# Patient Record
Sex: Female | Born: 1960 | Race: Black or African American | Hispanic: No | Smoking: Current every day smoker
Health system: Southern US, Community
[De-identification: ages and names within clinical notes are randomized; demographics above are authoritative.]

## PROBLEM LIST (undated history)

## (undated) DIAGNOSIS — I455 Other specified heart block: Secondary | ICD-10-CM

## (undated) DIAGNOSIS — M545 Low back pain, unspecified: Secondary | ICD-10-CM

## (undated) DIAGNOSIS — R202 Paresthesia of skin: Secondary | ICD-10-CM

## (undated) DIAGNOSIS — I341 Nonrheumatic mitral (valve) prolapse: Secondary | ICD-10-CM

## (undated) DIAGNOSIS — I10 Essential (primary) hypertension: Secondary | ICD-10-CM

## (undated) DIAGNOSIS — A64 Unspecified sexually transmitted disease: Secondary | ICD-10-CM

## (undated) DIAGNOSIS — M1990 Unspecified osteoarthritis, unspecified site: Secondary | ICD-10-CM

## (undated) DIAGNOSIS — K760 Fatty (change of) liver, not elsewhere classified: Secondary | ICD-10-CM

## (undated) DIAGNOSIS — J449 Chronic obstructive pulmonary disease, unspecified: Secondary | ICD-10-CM

## (undated) DIAGNOSIS — I5032 Chronic diastolic (congestive) heart failure: Secondary | ICD-10-CM

## (undated) DIAGNOSIS — T4145XA Adverse effect of unspecified anesthetic, initial encounter: Secondary | ICD-10-CM

## (undated) DIAGNOSIS — F32A Depression, unspecified: Secondary | ICD-10-CM

## (undated) DIAGNOSIS — F1910 Other psychoactive substance abuse, uncomplicated: Secondary | ICD-10-CM

## (undated) DIAGNOSIS — G43909 Migraine, unspecified, not intractable, without status migrainosus: Secondary | ICD-10-CM

## (undated) DIAGNOSIS — F419 Anxiety disorder, unspecified: Secondary | ICD-10-CM

## (undated) DIAGNOSIS — E119 Type 2 diabetes mellitus without complications: Secondary | ICD-10-CM

## (undated) DIAGNOSIS — J42 Unspecified chronic bronchitis: Secondary | ICD-10-CM

## (undated) DIAGNOSIS — H44001 Unspecified purulent endophthalmitis, right eye: Secondary | ICD-10-CM

## (undated) DIAGNOSIS — G709 Myoneural disorder, unspecified: Secondary | ICD-10-CM

## (undated) DIAGNOSIS — R011 Cardiac murmur, unspecified: Secondary | ICD-10-CM

## (undated) DIAGNOSIS — G8929 Other chronic pain: Secondary | ICD-10-CM

## (undated) DIAGNOSIS — I639 Cerebral infarction, unspecified: Secondary | ICD-10-CM

## (undated) DIAGNOSIS — Z8489 Family history of other specified conditions: Secondary | ICD-10-CM

## (undated) DIAGNOSIS — D219 Benign neoplasm of connective and other soft tissue, unspecified: Secondary | ICD-10-CM

## (undated) DIAGNOSIS — M7989 Other specified soft tissue disorders: Secondary | ICD-10-CM

## (undated) DIAGNOSIS — R0602 Shortness of breath: Secondary | ICD-10-CM

## (undated) DIAGNOSIS — G4730 Sleep apnea, unspecified: Secondary | ICD-10-CM

## (undated) DIAGNOSIS — I509 Heart failure, unspecified: Secondary | ICD-10-CM

## (undated) DIAGNOSIS — T8859XA Other complications of anesthesia, initial encounter: Secondary | ICD-10-CM

## (undated) HISTORY — PX: CARPAL TUNNEL RELEASE: SHX101

## (undated) HISTORY — DX: Anxiety disorder, unspecified: F41.9

## (undated) HISTORY — DX: Essential (primary) hypertension: I10

## (undated) HISTORY — PX: DILATION AND CURETTAGE OF UTERUS: SHX78

## (undated) HISTORY — PX: FOOT SURGERY: SHX648

## (undated) HISTORY — DX: Depression, unspecified: F32.A

## (undated) HISTORY — DX: Unspecified sexually transmitted disease: A64

## (undated) HISTORY — DX: Other psychoactive substance abuse, uncomplicated: F19.10

## (undated) HISTORY — DX: Paresthesia of skin: R20.2

## (undated) HISTORY — DX: Benign neoplasm of connective and other soft tissue, unspecified: D21.9

## (undated) HISTORY — PX: MYOMECTOMY: SHX85

---

## 1999-02-20 ENCOUNTER — Other Ambulatory Visit: Admission: RE | Admit: 1999-02-20 | Discharge: 1999-02-20 | Payer: Self-pay | Admitting: Obstetrics and Gynecology

## 1999-09-06 ENCOUNTER — Other Ambulatory Visit: Admission: RE | Admit: 1999-09-06 | Discharge: 1999-09-06 | Payer: Self-pay | Admitting: *Deleted

## 2000-11-19 ENCOUNTER — Other Ambulatory Visit: Admission: RE | Admit: 2000-11-19 | Discharge: 2000-11-19 | Payer: Self-pay | Admitting: *Deleted

## 2002-03-13 ENCOUNTER — Emergency Department (HOSPITAL_COMMUNITY): Admission: EM | Admit: 2002-03-13 | Discharge: 2002-03-13 | Payer: Self-pay | Admitting: Emergency Medicine

## 2002-03-15 ENCOUNTER — Emergency Department (HOSPITAL_COMMUNITY): Admission: EM | Admit: 2002-03-15 | Discharge: 2002-03-16 | Payer: Self-pay | Admitting: Emergency Medicine

## 2002-04-28 ENCOUNTER — Other Ambulatory Visit: Admission: RE | Admit: 2002-04-28 | Discharge: 2002-04-28 | Payer: Self-pay | Admitting: Obstetrics and Gynecology

## 2002-06-14 ENCOUNTER — Ambulatory Visit (HOSPITAL_COMMUNITY): Admission: RE | Admit: 2002-06-14 | Discharge: 2002-06-14 | Payer: Self-pay | Admitting: Obstetrics and Gynecology

## 2002-06-14 ENCOUNTER — Encounter (INDEPENDENT_AMBULATORY_CARE_PROVIDER_SITE_OTHER): Payer: Self-pay | Admitting: Specialist

## 2003-12-13 ENCOUNTER — Emergency Department (HOSPITAL_COMMUNITY): Admission: EM | Admit: 2003-12-13 | Discharge: 2003-12-13 | Payer: Self-pay

## 2004-03-05 ENCOUNTER — Other Ambulatory Visit: Admission: RE | Admit: 2004-03-05 | Discharge: 2004-03-05 | Payer: Self-pay | Admitting: Family Medicine

## 2006-05-07 ENCOUNTER — Emergency Department (HOSPITAL_COMMUNITY): Admission: EM | Admit: 2006-05-07 | Discharge: 2006-05-07 | Payer: Self-pay | Admitting: Family Medicine

## 2006-05-11 ENCOUNTER — Emergency Department (HOSPITAL_COMMUNITY): Admission: EM | Admit: 2006-05-11 | Discharge: 2006-05-11 | Payer: Self-pay | Admitting: Emergency Medicine

## 2006-05-15 ENCOUNTER — Ambulatory Visit: Payer: Self-pay | Admitting: Family Medicine

## 2006-09-28 ENCOUNTER — Emergency Department (HOSPITAL_COMMUNITY): Admission: EM | Admit: 2006-09-28 | Discharge: 2006-09-28 | Payer: Self-pay | Admitting: Emergency Medicine

## 2006-11-07 ENCOUNTER — Emergency Department (HOSPITAL_COMMUNITY): Admission: EM | Admit: 2006-11-07 | Discharge: 2006-11-07 | Payer: Self-pay | Admitting: Family Medicine

## 2006-11-11 ENCOUNTER — Emergency Department (HOSPITAL_COMMUNITY): Admission: EM | Admit: 2006-11-11 | Discharge: 2006-11-11 | Payer: Self-pay | Admitting: Family Medicine

## 2006-11-12 ENCOUNTER — Emergency Department (HOSPITAL_COMMUNITY): Admission: EM | Admit: 2006-11-12 | Discharge: 2006-11-12 | Payer: Self-pay | Admitting: Emergency Medicine

## 2006-11-13 ENCOUNTER — Ambulatory Visit: Payer: Self-pay | Admitting: Family Medicine

## 2006-11-13 ENCOUNTER — Encounter: Payer: Self-pay | Admitting: Family Medicine

## 2006-11-13 LAB — CONVERTED CEMR LAB
CO2: 27 meq/L (ref 19–32)
Calcium: 9.4 mg/dL (ref 8.4–10.5)
Chloride: 103 meq/L (ref 96–112)
Creatinine, Ser: 0.84 mg/dL (ref 0.40–1.20)
Glucose, Bld: 97 mg/dL (ref 70–99)

## 2006-11-21 ENCOUNTER — Emergency Department (HOSPITAL_COMMUNITY): Admission: EM | Admit: 2006-11-21 | Discharge: 2006-11-21 | Payer: Self-pay | Admitting: Emergency Medicine

## 2006-11-27 ENCOUNTER — Encounter: Payer: Self-pay | Admitting: Family Medicine

## 2006-11-27 ENCOUNTER — Ambulatory Visit: Payer: Self-pay | Admitting: Family Medicine

## 2006-11-27 LAB — CONVERTED CEMR LAB
ALT: 11 units/L (ref 0–35)
AST: 14 units/L (ref 0–37)
BUN: 11 mg/dL (ref 6–23)
Cholesterol: 186 mg/dL (ref 0–200)
Creatinine, Ser: 0.97 mg/dL (ref 0.40–1.20)
HDL: 55 mg/dL (ref >39)
Total Bilirubin: 0.3 mg/dL (ref 0.3–1.2)
Total CHOL/HDL Ratio: 3.4
VLDL: 23 mg/dL (ref 0–40)

## 2006-12-17 DIAGNOSIS — Z720 Tobacco use: Secondary | ICD-10-CM

## 2006-12-17 DIAGNOSIS — F17200 Nicotine dependence, unspecified, uncomplicated: Secondary | ICD-10-CM | POA: Insufficient documentation

## 2008-04-09 ENCOUNTER — Emergency Department (HOSPITAL_COMMUNITY): Admission: EM | Admit: 2008-04-09 | Discharge: 2008-04-09 | Payer: Self-pay | Admitting: Emergency Medicine

## 2008-04-26 ENCOUNTER — Encounter: Payer: Self-pay | Admitting: Family Medicine

## 2008-04-26 ENCOUNTER — Ambulatory Visit: Payer: Self-pay | Admitting: Family Medicine

## 2008-04-26 DIAGNOSIS — F1910 Other psychoactive substance abuse, uncomplicated: Secondary | ICD-10-CM | POA: Insufficient documentation

## 2008-04-26 LAB — CONVERTED CEMR LAB
BUN: 11 mg/dL (ref 6–23)
Beta hcg, urine, semiquantitative: NEGATIVE
CO2: 26 meq/L (ref 19–32)
Glucose, Bld: 97 mg/dL (ref 70–99)
Potassium: 4.3 meq/L (ref 3.5–5.3)
Sodium: 139 meq/L (ref 135–145)
Whiff Test: NEGATIVE

## 2008-04-27 ENCOUNTER — Encounter: Payer: Self-pay | Admitting: Family Medicine

## 2008-05-15 ENCOUNTER — Encounter: Payer: Self-pay | Admitting: *Deleted

## 2008-05-18 ENCOUNTER — Encounter: Payer: Self-pay | Admitting: Family Medicine

## 2008-05-18 ENCOUNTER — Ambulatory Visit: Payer: Self-pay | Admitting: Family Medicine

## 2008-05-18 ENCOUNTER — Ambulatory Visit (HOSPITAL_COMMUNITY): Admission: RE | Admit: 2008-05-18 | Discharge: 2008-05-18 | Payer: Self-pay | Admitting: Family Medicine

## 2008-05-18 LAB — CONVERTED CEMR LAB
Alkaline Phosphatase: 80 units/L (ref 39–117)
CO2: 23 meq/L (ref 19–32)
Creatinine, Ser: 0.93 mg/dL (ref 0.40–1.20)
Glucose, Bld: 79 mg/dL (ref 70–99)
Sodium: 139 meq/L (ref 135–145)
Total Bilirubin: 0.2 mg/dL — ABNORMAL LOW (ref 0.3–1.2)
Total Protein: 7 g/dL (ref 6.0–8.3)

## 2008-05-19 ENCOUNTER — Encounter (INDEPENDENT_AMBULATORY_CARE_PROVIDER_SITE_OTHER): Payer: Self-pay | Admitting: *Deleted

## 2008-05-20 ENCOUNTER — Emergency Department (HOSPITAL_COMMUNITY): Admission: EM | Admit: 2008-05-20 | Discharge: 2008-05-20 | Payer: Self-pay | Admitting: Emergency Medicine

## 2008-05-22 ENCOUNTER — Telehealth: Payer: Self-pay | Admitting: Family Medicine

## 2008-05-25 ENCOUNTER — Encounter: Payer: Self-pay | Admitting: Family Medicine

## 2008-05-25 ENCOUNTER — Ambulatory Visit: Payer: Self-pay | Admitting: Family Medicine

## 2008-05-26 ENCOUNTER — Encounter: Payer: Self-pay | Admitting: Family Medicine

## 2008-05-26 ENCOUNTER — Ambulatory Visit: Payer: Self-pay | Admitting: Family Medicine

## 2008-05-26 LAB — CONVERTED CEMR LAB: Pro B Natriuretic peptide (BNP): 17 pg/mL (ref 0.0–100.0)

## 2008-05-30 ENCOUNTER — Encounter: Payer: Self-pay | Admitting: Family Medicine

## 2008-06-01 ENCOUNTER — Ambulatory Visit: Payer: Self-pay | Admitting: Family Medicine

## 2008-06-01 ENCOUNTER — Encounter: Payer: Self-pay | Admitting: *Deleted

## 2008-06-05 ENCOUNTER — Encounter: Payer: Self-pay | Admitting: Family Medicine

## 2008-06-05 ENCOUNTER — Telehealth: Payer: Self-pay | Admitting: Family Medicine

## 2008-06-07 ENCOUNTER — Telehealth (INDEPENDENT_AMBULATORY_CARE_PROVIDER_SITE_OTHER): Payer: Self-pay | Admitting: *Deleted

## 2008-06-13 ENCOUNTER — Ambulatory Visit: Payer: Self-pay | Admitting: Family Medicine

## 2008-06-13 ENCOUNTER — Encounter: Payer: Self-pay | Admitting: Family Medicine

## 2008-06-14 ENCOUNTER — Ambulatory Visit: Payer: Self-pay | Admitting: Cardiology

## 2008-06-14 ENCOUNTER — Encounter: Payer: Self-pay | Admitting: Family Medicine

## 2008-06-14 ENCOUNTER — Ambulatory Visit (HOSPITAL_COMMUNITY): Admission: RE | Admit: 2008-06-14 | Discharge: 2008-06-14 | Payer: Self-pay | Admitting: Family Medicine

## 2008-06-14 LAB — CONVERTED CEMR LAB: Pap Smear: NORMAL

## 2008-06-18 ENCOUNTER — Encounter: Payer: Self-pay | Admitting: Family Medicine

## 2008-06-22 ENCOUNTER — Telehealth: Payer: Self-pay | Admitting: *Deleted

## 2008-06-23 ENCOUNTER — Encounter: Payer: Self-pay | Admitting: *Deleted

## 2008-07-05 ENCOUNTER — Encounter: Payer: Self-pay | Admitting: Family Medicine

## 2008-07-13 ENCOUNTER — Encounter: Payer: Self-pay | Admitting: Family Medicine

## 2008-08-05 ENCOUNTER — Encounter: Payer: Self-pay | Admitting: Family Medicine

## 2008-11-13 ENCOUNTER — Ambulatory Visit: Payer: Self-pay | Admitting: Family Medicine

## 2008-11-13 ENCOUNTER — Encounter (INDEPENDENT_AMBULATORY_CARE_PROVIDER_SITE_OTHER): Payer: Self-pay | Admitting: Family Medicine

## 2008-11-13 ENCOUNTER — Telehealth: Payer: Self-pay | Admitting: Family Medicine

## 2008-11-13 LAB — CONVERTED CEMR LAB
Chlamydia, DNA Probe: NEGATIVE
Whiff Test: POSITIVE

## 2009-01-11 ENCOUNTER — Emergency Department (HOSPITAL_COMMUNITY): Admission: EM | Admit: 2009-01-11 | Discharge: 2009-01-11 | Payer: Self-pay | Admitting: Family Medicine

## 2009-07-14 ENCOUNTER — Encounter: Payer: Self-pay | Admitting: Obstetrics and Gynecology

## 2009-07-14 ENCOUNTER — Emergency Department (HOSPITAL_COMMUNITY): Admission: EM | Admit: 2009-07-14 | Discharge: 2009-07-14 | Payer: Self-pay | Admitting: Emergency Medicine

## 2009-07-18 ENCOUNTER — Inpatient Hospital Stay (HOSPITAL_COMMUNITY): Admission: AD | Admit: 2009-07-18 | Discharge: 2009-07-19 | Payer: Self-pay | Admitting: Family Medicine

## 2009-07-18 ENCOUNTER — Ambulatory Visit: Payer: Self-pay | Admitting: Family Medicine

## 2009-07-20 ENCOUNTER — Inpatient Hospital Stay (HOSPITAL_COMMUNITY): Admission: AD | Admit: 2009-07-20 | Discharge: 2009-07-20 | Payer: Self-pay | Admitting: Obstetrics & Gynecology

## 2009-08-29 ENCOUNTER — Ambulatory Visit: Payer: Self-pay | Admitting: Obstetrics and Gynecology

## 2010-05-26 ENCOUNTER — Encounter: Payer: Self-pay | Admitting: Family Medicine

## 2010-05-26 ENCOUNTER — Ambulatory Visit: Payer: Self-pay | Admitting: Family Medicine

## 2010-05-26 ENCOUNTER — Encounter: Payer: Self-pay | Admitting: Emergency Medicine

## 2010-05-26 ENCOUNTER — Observation Stay (HOSPITAL_COMMUNITY): Admission: EM | Admit: 2010-05-26 | Discharge: 2010-05-27 | Payer: Self-pay | Admitting: Family Medicine

## 2010-05-26 DIAGNOSIS — I10 Essential (primary) hypertension: Secondary | ICD-10-CM

## 2010-06-03 ENCOUNTER — Other Ambulatory Visit: Payer: Self-pay | Admitting: Family Medicine

## 2010-06-03 ENCOUNTER — Ambulatory Visit: Payer: Self-pay | Admitting: Obstetrics and Gynecology

## 2010-06-03 ENCOUNTER — Emergency Department (HOSPITAL_COMMUNITY): Admission: EM | Admit: 2010-06-03 | Discharge: 2010-06-03 | Payer: Self-pay | Admitting: Emergency Medicine

## 2010-06-06 ENCOUNTER — Ambulatory Visit: Payer: Self-pay | Admitting: Family Medicine

## 2010-06-11 ENCOUNTER — Telehealth: Payer: Self-pay | Admitting: Family Medicine

## 2010-06-12 ENCOUNTER — Ambulatory Visit: Payer: Self-pay | Admitting: Family Medicine

## 2010-06-12 DIAGNOSIS — R609 Edema, unspecified: Secondary | ICD-10-CM | POA: Insufficient documentation

## 2010-06-13 ENCOUNTER — Telehealth: Payer: Self-pay | Admitting: Family Medicine

## 2010-06-21 ENCOUNTER — Encounter: Payer: Self-pay | Admitting: Family Medicine

## 2010-06-21 ENCOUNTER — Ambulatory Visit: Payer: Self-pay | Admitting: Family Medicine

## 2010-06-21 LAB — CONVERTED CEMR LAB
AST: 14 units/L (ref 0–37)
Albumin: 3.8 g/dL (ref 3.5–5.2)
Alkaline Phosphatase: 82 units/L (ref 39–117)
BUN: 12 mg/dL (ref 6–23)
Creatinine, Ser: 0.84 mg/dL (ref 0.40–1.20)
Glucose, Bld: 97 mg/dL (ref 70–99)
Potassium: 3.8 meq/L (ref 3.5–5.3)
Total Bilirubin: 0.2 mg/dL — ABNORMAL LOW (ref 0.3–1.2)

## 2010-06-26 ENCOUNTER — Encounter: Payer: Self-pay | Admitting: Family Medicine

## 2010-06-26 ENCOUNTER — Ambulatory Visit: Payer: Self-pay | Admitting: Cardiovascular Disease

## 2010-06-26 ENCOUNTER — Ambulatory Visit: Payer: Self-pay

## 2010-06-26 ENCOUNTER — Ambulatory Visit (HOSPITAL_COMMUNITY): Admission: RE | Admit: 2010-06-26 | Discharge: 2010-06-26 | Payer: Self-pay | Admitting: Family Medicine

## 2010-06-27 ENCOUNTER — Ambulatory Visit: Payer: Self-pay | Admitting: Family Medicine

## 2010-06-27 LAB — CONVERTED CEMR LAB
Glucose, Urine, Semiquant: NEGATIVE
Protein, U semiquant: 30
Urobilinogen, UA: 0.2

## 2010-07-29 ENCOUNTER — Ambulatory Visit: Payer: Self-pay | Admitting: Family Medicine

## 2010-08-23 ENCOUNTER — Ambulatory Visit: Payer: Self-pay | Admitting: Family Medicine

## 2010-08-23 DIAGNOSIS — M1710 Unilateral primary osteoarthritis, unspecified knee: Secondary | ICD-10-CM

## 2010-08-27 ENCOUNTER — Ambulatory Visit: Payer: Self-pay | Admitting: Family Medicine

## 2010-09-24 ENCOUNTER — Emergency Department (HOSPITAL_COMMUNITY)
Admission: EM | Admit: 2010-09-24 | Discharge: 2010-09-24 | Payer: Self-pay | Source: Home / Self Care | Admitting: Family Medicine

## 2010-10-04 ENCOUNTER — Ambulatory Visit: Payer: Self-pay

## 2010-10-08 ENCOUNTER — Emergency Department (HOSPITAL_COMMUNITY)
Admission: EM | Admit: 2010-10-08 | Discharge: 2010-10-08 | Payer: Self-pay | Source: Home / Self Care | Admitting: Family Medicine

## 2010-11-19 NOTE — Progress Notes (Signed)
Summary: triage   Phone Note Call from Patient Call back at Home Phone [phone]   Caller: Patient Summary of Call: Pt said when she was seen last that there should have been a rx sent in to the pharmacy for bp meds.  Pharmacy is [facility]. Pt at motel and the Room [address] (Also wanted to talk to a nurse because her legs are so swollen up with fluid she can't bend them.) Initial call taken by: Raymond Gurney,  June 11, 2010 4:17 PM  Follow-up for Phone Call        explained that her very swollen legs are partly because she has not yet taken her meds & is on them working each day. appt made for tomorrow pm . instructed her to go get the meds today & start them  Follow-up by: Elige Radon RN,  June 11, 2010 4:44 PM    Prescriptions: NORVASC 10 MG TABS (AMLODIPINE BESYLATE) Take one tab by mouth daily.  #30 x 2   Entered by:   Elige Radon RN   Authorized by:   Karen Kays MD   Signed by:   Elige Radon RN on 06/11/2010   Method used:   Electronically to        [id number] (retail)       [address]       [place], [place]  [address]       Ph: [phone]       Fax: [phone]   RxID:   [id number] LISINOPRIL-HYDROCHLOROTHIAZIDE 20-12.5 MG TABS (LISINOPRIL-HYDROCHLOROTHIAZIDE) Take 1 tablet by mouth daily.  #30 x 2   Entered by:   Elige Radon RN   Authorized by:   Karen Kays MD   Signed by:   Elige Radon RN on 06/11/2010   Method used:   Electronically to        [id number] (retail)       [address]       [place], [place]  [address]       Ph: [phone]       Fax: [phone]   RxID:   [id number]

## 2010-11-19 NOTE — Assessment & Plan Note (Signed)
Summary: Hailey Barnes,Hailey Barnes   Vital Signs:  Patient profile:   50 year old female Height:      63 inches Weight:      236 pounds BMI:     41.96 Temp:     98.1 degrees F oral Pulse rate:   86 / minute BP sitting:   201 / 127  (left arm) Cuff size:   regular  Vitals Entered By: Levert Feinstein LPN (June 06, 6225 1:48 PM)  CC:  hospital f/u.  History of Present Illness: 1. High BP. Has been taking BP meds. For past 3 days has been taking 2 pills at a time b/c leg swelling. Difficult walking/bending legs.  Out of work past few days due to high BP (not measured, but feel BP is high).  ROS: pressure with urination, decreased memory Denies new weakness/numbness, palpitations, chest pain  2. Headache. "Nagging" over past few days. Present but doesn't feel "pounding" like it was at hospital admission. Headache now 1-4. Facial pressure/tightness. Sees black things dancing. Taking pain meds 1-2 daily since hospital D/C.   ROS: blurry vision constant  3. R back pain. BV infxn (+ moderate clue cells). Has been taking metronidazole. Not as hurting as much as before. Pain is 4 (before was 10). Now dull pain. Nothing aggravates. Alleviated by pain med.   Some vaginal D/C but decreased & not as thick. Denies hematuria, diarrhea.   Current Medications (verified): 1)  Lisinopril-Hydrochlorothiazide 20-12.5 Mg Tabs (Lisinopril-Hydrochlorothiazide) .... Take 1 Tablet By Mouth Daily. 2)  Hydrocodone-Acetominophen 5/325 .... Take 1-2 Tabs Every 4-6 Hrs As Needed For Pain. 3)  Norvasc 10 Mg Tabs (Amlodipine Besylate) .... Take One Tab By Mouth Daily.  Allergies (verified): No Known Drug Allergies   Social History: Denies other drug use, no EtOH, smokes 1/2 ppd.  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 Employer told her to take time off until high BP & other health issues resolved.   Physical Exam  General:  alert, well-developed, well-nourished, and well-hydrated.   Head:  normocephalic and atraumatic.   no  tenderness Eyes:  PERRLA. Retina could not be visualized. EOMI Nose:  no rhinorrhea Mouth:  no erythema or lesions Neck:  supple, full ROM, no carotid bruits B/L Lungs:  CTAB, no dullness/wheezes/rales/ronchi Heart:  RRR, S4 heard, no lifts/heaves/thrills Abdomen:  obese, soft, non-tender, normal bowel sounds, no distention, no masses, no guarding, no rigidity, and no rebound tenderness.   Extremities:  1+ pedal edema B/L Neurologic:  alert & oriented X3, cranial nerves II-XII intact, strength normal in all extremities, sensation intact to light touch, gait normal, and finger-to-nose normal.   Cervical Nodes:  no anterior cervical adenopathy and no posterior cervical adenopathy.     Impression & Recommendations:  Problem # 1:  HYPERTENSION, SEVERE (ICD-401.0)  Continue current ACEi/diuretic and add Norvasc 10 mg. Will follow-up 1 wk. Told to go to ED if symptoms worsen. See pt handout.  The following medications were removed from the medication list:    Furosemide 20 Mg Tabs (Furosemide) .Marland Kitchen... 1 tablet by mouth qam    Cardizem La 240 Mg Xr24h-tab (Diltiazem hcl coated beads) .Marland Kitchen... 1 tab by mouth daily Her updated medication list for this problem includes:    Lisinopril-hydrochlorothiazide 20-12.5 Mg Tabs (Lisinopril-hydrochlorothiazide) .Marland Kitchen... Take 1 tablet by mouth daily.    Norvasc 10 Mg Tabs (Amlodipine besylate) .Marland Kitchen... Take one tab by mouth daily.  Orders: [place]- Est Level  3 ([address])  Problem # 2:  HEADACHE (ICD-784.0)  Not as bad as hospital  admission, controlled w pain med. Told to take Tylenol if run out of pain meds. Told to return to ED if headache becomes severe.  Orders: [place]- Est Level  3 ([address])  Problem # 3:  TOBACCO DEPENDENCE (ICD-305.1) Encouraged her to continue cut back on smoking. Pt agrees but am unsure if pt motivated enough right now. Will keep talking to her about this.   Problem # 4:  FLANK PAIN, RIGHT (ICD-789.09)  Attributed to BV in the ED over  weekend, but BV shouldn't be causing R flank pain. But pt taking Metronidazole and pain is improved. R hydrosalpinx, cannot exclude pyosalpinx or tubo-ovarian abcess, found on CT when being evaluated for this pain. Will keep this in mind if R flank pain worsens or doesn't improve. Pt agreed with this plan.   Orders: [place]- Est Level  3 ([address])  Complete Medication List: 1)  Lisinopril-hydrochlorothiazide 20-12.5 Mg Tabs (Lisinopril-hydrochlorothiazide) .... Take 1 tablet by mouth daily. 2)  Hydrocodone-acetominophen 5/325  .... Take 1-2 tabs every 4-6 hrs as needed for pain. 3)  Norvasc 10 Mg Tabs (Amlodipine besylate) .... Take one tab by mouth daily.  Patient Instructions: 1)  I want you to continue taking your BP med (lisinopril/HCTZ) and I want you to start taking a new one called amlodipine or Norvasc.  2)  You can get these medicines cheaper by applying for [facility] [place]. 3)  If your symptoms worsen (your headache gets worse, vision changes), please go to the ED. 4)  Follow-up with me next week. 5)  If your R back pain gets worse or you urinate blood, please call the clinic or go to the ED.  6)  If you run out of your pain med, start Tylenol 1-2 tabs every 4-6 hrs as needed.  Prescriptions: NORVASC 10 MG TABS (AMLODIPINE BESYLATE) Take one tab by mouth daily.  #30 x 2   Entered and Authorized by:   Karen Kays MD   Signed by:   Sheral Flow Park MD on 06/06/2010   Method used:   Electronically to        [id number] (retail)       [address]       [place], [place]  [address]       Ph: [phone]       Fax: [phone]   RxID:   [id number] LISINOPRIL-HYDROCHLOROTHIAZIDE 20-12.5 MG TABS (LISINOPRIL-HYDROCHLOROTHIAZIDE) Take 1 tablet by mouth daily.  #30 x 2   Entered and Authorized by:   Karen Kays MD   Signed by:   Sheral Flow Park MD on 06/06/2010   Method used:   Electronically to        [id number]  (retail[facility]       [address]       [place], [place]  [address]       Ph: [phone]       Fax: [phone]   RxID:   [id number]

## 2010-11-19 NOTE — Assessment & Plan Note (Signed)
Summary: very swollen legs/[place]   Vital Signs:  Patient profile:   50 year old female Weight:      236.5 pounds Pulse rate:   85 / minute BP sitting:   160 / 108  (right arm) Cuff size:   large  Vitals Entered By: Gerrit Heck slade,cma CC: swelling of legs worse over the last week. just started new htn meds yesterday. Is Patient Diabetic? No Pain Assessment Patient in pain? yes     Location: legs Intensity: 8 Onset of pain  x1 week   Primary Care Provider:  Karen Kays MD  CC:  swelling of legs worse over the last week. just started new htn meds yesterday.Marland Kitchen  History of Present Illness: 50 y/o F here   B/l leg edema x 2-3 wks.  The last time this happened was months to a year ago.  Taking lisinopril 20, hctz 12.5 since discharge from hosp 8/7.  Just started norvasc 10 yesterday.  Was off medications for 1 yr until seen in ER on 8/7.  Over a year a go pt was on Lasix 41m daily + KCl.    Looking at echart, pt had echo 2009: Overall left ventricular systolic function was normal. Left ventricular ejection fraction was estimated to be 60 %. There were no left ventricular regional wall motion abnormalities.  Left ventricular wall thickness was mildly increased.  Substance abuse: cocaine. clean x 50 days.  She cannot recall edema history previous to being clean.    Habits & Providers  Alcohol-Tobacco-Diet     Tobacco Status: current     Tobacco Counseling: to quit use of tobacco products  Current Medications (verified): 1)  Lisinopril-Hydrochlorothiazide 20-12.5 Mg Tabs (Lisinopril-Hydrochlorothiazide) .... Take 2 Tablet By Mouth Daily. 2)  Hydrocodone-Acetominophen 5/325 .... Take 1-2 Tabs Every 4-6 Hrs As Needed For Pain. 3)  Norvasc 10 Mg Tabs (Amlodipine Besylate) .... Take One Tab By Mouth Daily.  Allergies (verified): No Known Drug Allergies  Social History: Denies other drug use, no EtOH, smokes 1/2 ppd.  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   Review of Systems General:  Denies chills, fatigue, and fever. CV:  Complains of swelling of feet and swelling of hands; denies bluish discoloration of lips or nails, chest pain or discomfort, fainting, lightheadness, near fainting, palpitations, and shortness of breath with exertion. Resp:  Denies chest discomfort, chest pain with inspiration, and cough.  Physical Exam  General:  Well-developed,well-nourished,in no acute distress; alert,appropriate and cooperative throughout examination. vitals reviewed.  Lungs:  Normal respiratory effort, chest expands symmetrically. Lungs are clear to auscultation, no crackles or wheezes. Heart:  Normal rate and regular rhythm. S1 and S2 normal without gallop, murmur, click, rub or other extra sounds. Extremities:  1+ left pedal edema and 1+ right pedal edema.   no redness, no warmth,  Neurologic:  alert & oriented X3.     Impression & Recommendations:  Problem # 1:  LEG EDEMA, BILATERAL (ICD-782.3) Assessment New LE edema is not likely DVT.  Pt is not dyspneic, lungs are cta.  [place]  Since BP is not at goal, we will increase lisinopril 20/hctz 12.5 to 2 tabs daily.  The added affect of diuretic may help with LE edema.  Pt to rtc next week to see me or Dr OVerdie Drownfor f/u.  Will need to check Bmet at that time.   It is not common  to have LE edema with normal EF/no CHF.  It may be that cardiac function has worsened since 2009 because of poor HTN control.  Pt may need repeat echo in future. Her updated medication list for this problem includes:    Lisinopril-hydrochlorothiazide 20-12.5 Mg Tabs (Lisinopril-hydrochlorothiazide) .Marland Kitchen... Take 2 tablet by mouth daily.  Orders: [place]- Est Level  3 ([id number])  Complete Medication List: 1)  Lisinopril-hydrochlorothiazide 20-12.5 Mg Tabs (Lisinopril-hydrochlorothiazide) .... Take 2 tablet by mouth daily. 2)  Hydrocodone-acetominophen 5/325   .... Take 1-2 tabs every 4-6 hrs as needed for pain. 3)  Norvasc 10 Mg Tabs (Amlodipine besylate) .... Take one tab by mouth daily.  Patient Instructions: 1)  Please schedule a follow-up appointment in 1 week with Dr Verdie Drown or Dr Earley Favor for blood pressure and leg swelling.  Cancel the appointment you have with Dr Sherilyn Cooter on Friday. 2)  To decrease your leg swelling, increase the Lisinopril/HCTZ medicine to 2 tabs one time daily. 3)  We will check your labs for electrolytes and kidney function at your appt next week. 4)  Avoid salt to decrease swelling.

## 2010-11-19 NOTE — Assessment & Plan Note (Signed)
Summary: f/u last visit/per [place]/eo   Vital Signs:  Patient profile:   50 year old female Weight:      238.5 pounds BMI:     42.40 Temp:     98.0 degrees F Pulse rate:   64 / minute BP sitting:   178 / 120  (left arm)  Vitals Entered By: Hailey Cooley RN (June 21, 2010 9:23 AM) CC: f/u Is Patient Diabetic? No Pain Assessment Patient in pain? yes     Location: knees Intensity: 4   Primary Care Provider:  Karen Kays MD  CC:  f/u.  History of Present Illness: [name] is a 50 y/o with longstanding untreated hypertension.  Until very recently (last 50 days) patient has been addicted to crack cocaine.  Because of this lifestyle she was not compliant with her medical care.  On 05/26/10 pt was was admitted to [facility] for chest pain thought to be secondary to hypertensive urgency, BP 200/143.  Serial cardiac enzymes were negative.  Pt was discharged home with close follow-up at [facility].  Since then she has been followed weekly for hypertension and lower extremety edema.  At one time she was taking Lasix for edema.  An echocardiogram from 05/2008 showed normal systolic function with EF 60%.  Lasix was not continue at time of discharge.  Pt has had painful lower extremety edema.  We have increasing Lisinopril and HCTZ to help with BP and edema.  She is currently taking Lisinopril 19m and HCTZ 25.  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  She denies chest pain, dyspnea, lightheadedness, palpitations, syncope, PND.  She still consumes salt.  She is not exercising.  She is a current smoker.      Habits & Providers  Alcohol-Tobacco-Diet     Tobacco Status: current     Cigarette Packs/Day: 0.75  Current Medications (verified): 1)  Lisinopril-Hydrochlorothiazide 20-12.5 Mg Tabs (Lisinopril-Hydrochlorothiazide) .... Take 2 Tablet By Mouth Daily. 2)  Hydrocodone-Acetominophen 5/325 .... Take 1-2 Tabs Every 4-6 Hrs As Needed For Pain. 3)   Furosemide 40 Mg Tabs (Furosemide) ..Marland Kitchen. 1 Tab By Mouth Daily For Swelling 4)  Potassium Chloride Crys Cr 20 Meq Cr-Tabs (Potassium Chloride Crys Cr) ..Marland Kitchen. 1 Tab By Mouth Daily With Lasix  Allergies (verified): No Known Drug Allergies  Past History:  Past Medical History: Last updated: 05/26/2010 G1P1, h/o fibroidectomy, h/o LTCS, h/o PID HTN Morbid obesity  Past Surgical History: Last updated: 0Mar 16, 2008Lipid Panel 11/27/06 TC=186, TG=116, HDL=55, LDL =108 - 11/30/2006  Family History: Last updated: 016-Mar-2008dad-died from sepsis, dad-dm, htn, mom-living with htn  Social History: Last updated: 06/12/2010 Denies other drug use, no EtOH, smokes 1/2 ppd.  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   Risk Factors: Smoking Status: current (06/21/2010) Packs/Day: 0.75 (06/21/2010)  Social History: Packs/Day:  0.75  Review of Systems       per hpi   Physical Exam  General:  Well-developed,well-nourished,in no acute distress; alert,appropriate and cooperative throughout examination. Vitals reviewed.  Head:  normocephalic and atraumatic.   Neck:  No deformities, masses, or tenderness noted. no bruit Lungs:  Normal respiratory effort, chest expands symmetrically. Lungs are clear to auscultation, no crackles or wheezes. Heart:  Normal rate and regular rhythm. S1 and S2 normal without gallop, murmur, click, rub or other extra sounds. Abdomen:  Bowel sounds positive,abdomen soft and non-tender without masses,  organomegaly or hernias noted. Pulses:  R and L carotid,radial,femoral,dorsalis pedis and posterior tibial pulses are full and equal bilaterally Extremities:  2+ left pedal edema and 2+ right pedal edema.   Neurologic:  alert & oriented X3 and cranial nerves II-XII intact.   Skin:  Intact without suspicious lesions or rashes   Impression & Recommendations:  Problem # 1:  HYPERTENSION, SEVERE  (ICD-401.0) Assessment Unchanged BP remains elevated.  I suspect that pt may need multiple meds to adequately control her BP, but I am reluctant to make too many changes at this time.  I will be adding Lasix today to give her some relief with lower extremety edema.  Norvasc was prescribed at one time but I advised pt not to take this since it could make her LE edema worse. I considered adding BB to control BP but her heart rate is in the 60s. Hydralazine is an alternative that we may consider.   If pt is to remain on Lasix, we would need to d/c HCTZ, as it is only needed with Lasix in cases where HCTZ is needed to enhance high dose Lasix.  I will leave HCTZ as is for now until she sees cardiology.  The following medications were removed from the medication list:    Norvasc 10 Mg Tabs (Amlodipine besylate) .Marland Kitchen... Take one tab by mouth daily. Her updated medication list for this problem includes:    Lisinopril-hydrochlorothiazide 20-12.5 Mg Tabs (Lisinopril-hydrochlorothiazide) .Marland Kitchen... Take 2 tablet by mouth daily.    Furosemide 40 Mg Tabs (Furosemide) .Marland Kitchen... 1 tab by mouth daily for swelling  Orders: [facility] ([id number]) [place]- Est  Level 4 ([address]) 2 D Echo (2 D Echo)  Problem # 2:  LEG EDEMA, BILATERAL (ICD-782.3) Echo 2009 read by Dr Hailey Barnes: Overall left ventricular systolic function was normal. Left ventricular ejection fraction was estimated to be 60 %. There were no left ventricular regional wall motion abnormalities.  Left ventricular wall thickness was mildly increased. Given that pt has longstanding untreated hypertension, I suspect that cardiac function is compromised.  I would like to refer her back to Dr Hailey Barnes for repeat Echo.   Her updated medication list for this problem includes:    Lisinopril-hydrochlorothiazide 20-12.5 Mg Tabs (Lisinopril-hydrochlorothiazide) .Marland Kitchen... Take 2 tablet by mouth daily.    Furosemide 40 Mg Tabs (Furosemide) .Marland Kitchen... 1 tab by mouth daily for  swelling  Orders: [facility] ([id number]) [place]- Est  Level 4 ([address]) 2 D Echo (2 D Echo)  Problem # 3:  HYPOKALEMIA (ICD-276.8) Previously hypokalemic when on lasix.  Will replete with KCl 20 while on lasix. See HTN section.   Orders: [facility] [id number]  Complete Medication List: 1)  Lisinopril-hydrochlorothiazide 20-12.5 Mg Tabs (Lisinopril-hydrochlorothiazide) .... Take 2 tablet by mouth daily. 2)  Hydrocodone-acetominophen 5/325  .... Take 1-2 tabs every 4-6 hrs as needed for pain. 3)  Furosemide 40 Mg Tabs (Furosemide) .Marland Kitchen.. 1 tab by mouth daily for swelling 4)  Potassium Chloride Crys Cr 20 Meq Cr-tabs (Potassium chloride crys cr) .Marland Kitchen.. 1 tab by mouth daily with lasix  Patient Instructions: 1)  Please schedule a follow-up appointment in 1 week with Dr Earley Favor  for blood pressure.  2)  Keep your appointment with [facility] for Echo (ultrasound) 06/26/10, make appt with Dr Earley Favor after 06/26/10 3)  Tobacco is very bad for your health and your loved ones ! You should stop smoking !  4)  Stop smoking tips: Choose a quit date. Cut down before the quit date. Decide  what you will do as a substitute when you feel the urge to smoke(gum, toothpick, exercise).  5)  Continue Lisinopril/HCTZ 2 tabs daily 6)  Do not take Norvasc.  7)  Take Lasix daily for swelling.  8)  Take potassium along with Lasix. Prescriptions: POTASSIUM CHLORIDE CRYS CR 20 MEQ CR-TABS (POTASSIUM CHLORIDE CRYS CR) 1 tab by mouth daily with Lasix  #30 x 0   Entered and Authorized by:   Merla Riches MD   Signed by:   Hailey Cooley RN on 06/21/2010   Method used:   Historical   RxID:   [id number] POTASSIUM CHLORIDE 20 MEQ PACK (POTASSIUM CHLORIDE) 1 tab by mouth daily while taking Lasix  #30 x 0   Entered and Authorized by:   Merla Riches MD   Signed by:   Merla Riches MD on 06/21/2010   Method used:   Faxed to ...       [facility] [phone] (retail)       [place], [place]  [address]       Ph: [phone]        Fax: [phone]   RxID:   [id number] FUROSEMIDE 40 MG TABS (FUROSEMIDE) 1 tab by mouth daily for swelling  #30 x 0   Entered and Authorized by:   Merla Riches MD   Signed by:   Merla Riches MD on 06/21/2010   Method used:   Faxed to ...       [facility] [address] (retail)       [address]       [place], [place]  [address]       Ph: [phone]       Fax: [phone]   RxID:   [id number]

## 2010-11-19 NOTE — Progress Notes (Signed)
Summary: Change to BP meds   Phone Note Outgoing Call   Call placed by: Kenli Waldo MD,  June 13, 2010 4:40 PM Call placed to: Patient Summary of Call: Called to check on patient.  Swelling in less a little better, but she feels very tired today.  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  Usually does not take that long.  She is not lightheaded or short of breath or syncope. I am concerned that since we doubled her hctz/lisonpril to 25/40 and she was also started on norvasc 10 that her BP may be too low.  Told pt to stop norvasc until I see her next Fri.  She agreed.  Initial call taken by: Laurissa Cowper MD,  June 13, 2010 4:41 PM

## 2010-11-19 NOTE — Assessment & Plan Note (Signed)
Summary: OV Obesity, knee OA, HTN, LE edema   Vital Signs:  Patient profile:   50 year old female Height:      63 inches Weight:      250.13 pounds BMI:     44.47 Temp:     98.3 degrees F oral Pulse rate:   82 / minute BP sitting:   161 / 110  (right arm)  Vitals Entered By: Hedy Camara, CMA(August 23, 2010 3:21 PM) CC: Elevated B/P, Joint pain and swelling Is Patient Diabetic? No Pain Assessment Patient in pain? yes     Location: knee Intensity: 3 Type: throbbing   Primary Provider:  Karen Kays MD  CC:  Elevated B/P and Joint pain and swelling.  History of Present Illness: 1. Joint pain Especially L knee and R ankle. Started 4 months ago, after stopped smoking cocaine. Worse at the end of working day, after walking around all day. Does not prevent her from ADLs, but pt says she limps due to L knee pain by end of day. 2 tablets Alleve helps pain.   2. HTN Still elevated despite increasing lisinopril/HCTZ recently. Also on Norvasc.  3. Bilateral LE edema Mild but persistent past several months. Had been on Lasix. Has been seeing Dr. Earley Favor for this several times over past several weeks. Changed her from Lasix to HCTZ for this problem and did go up on HCTZ recently. But pt feels HCTZ not helping.   4. Cocaine abuse Now 4 months without using cocaine.  5. Paresthesias finger tips Noticed this 4 months ago also. Not getting worse, but continues to be there. Intermittent tingling in both finger tips.   6. Tobacco abuse Missed appt with Dr. Samara Deist b/c forgot. Pt has another appt early next week.   Allergies: No Known Drug Allergies  Social History: Smokes 3/4 ppd. Denies alcohol. Stopped using cocaine July 2011.  Works in Actuary at [employer].  Physical Exam  General:  alert, well-developed, well-nourished, and well-hydrated.   Lungs:  normal respiratory effort and normal breath sounds.   Heart:  normal rate, regular rhythm, no murmur, no gallop, and no rub.     Msk:  normal ROM, no joint tenderness, no joint swelling, no joint warmth, no redness over joints, no joint deformities, and no joint instability; crepitation over both knees.   Extremities:  1-2+ bilateral pretibial edema; 1+ pedal edema bilaterally  Diabetes Management Exam:    Foot Exam (with socks and/or shoes not present):       Sensory-Pinprick/Light touch:          Left medial foot (L-4): normal          Left dorsal foot (L-5): normal          Left lateral foot (S-1): normal          Right medial foot (L-4): normal          Right dorsal foot (L-5): normal          Right lateral foot (S-1): normal       Inspection:          Left foot: normal          Right foot: normal       Nails:          Left foot: normal          Right foot: normal   Impression & Recommendations:  Problem # 1:  OSTEOARTHRITIS, KNEE (ICD-715.96) Likely secondary to weight. Gained few pounds  over past few weeks (250 today, 254 previously). Cannot recommend NSAIDs due to elevated BP. Encouraged weight loss with exercise and dietary changes. Increased BP med and will consider starting NSAIDs if BP improved by next visit in 2-3 weeks. Tramadol two times a day in the meantime for pain. Will consider getting standing films of her knees if this problem persists.   Her updated medication list for this problem includes:    Tramadol Hcl 50 Mg Tabs (Tramadol hcl) .Marland Kitchen... Take 1 tablet every 12 hours for your joint pain.  Orders: [facility]- Est Level  3 (88828)  Problem # 2:  LEG EDEMA, BILATERAL (ICD-782.3) Doubling lisinopril/HCTZ. Potassium normal recent check.   Her updated medication list for this problem includes:    Lisinopril-hydrochlorothiazide 20-25 Mg Tabs (Lisinopril-hydrochlorothiazide) .Marland Kitchen... 1 tablet by mouth in the morning and 1 in the evening for your blood pressure and leg swelling.  Orders: [place]- Est Level  3 (00349)  Problem # 3:  HYPERTENSION, SEVERE (ICD-401.0) Doubling lisinopril/HCTZ. Will  re-check electrolytes f/u visit to assess K due to increased HCTZ and also due to h/o hypokalemia. Continue Norvasc. Although side effect of peripheral edema, need for better BP control more important. Will try to control peripheral edema with increased HCTZ.   Her updated medication list for this problem includes:    Lisinopril-hydrochlorothiazide 20-25 Mg Tabs (Lisinopril-hydrochlorothiazide) .Marland Kitchen... 1 tablet by mouth in the morning and 1 in the evening for your blood pressure and leg swelling.    Norvasc 10 Mg Tabs (Amlodipine besylate) .Marland Kitchen... 1 tab by mouth daily  Orders: [facility] [facility]- Est Level  3 (17915)  Problem # 4:  SUBSTANCE ABUSE (ICD-305.90) Stopped using cocaine past 4 months.  Problem # 5:  TOBACCO DEPENDENCE (ICD-305.1) Will f/u visit with Dr. Samara Deist. Pt expresses desire to quit and seems motivated, especially now she stopped using cocaine.   Problem # 6:  OBESITY, NOS (ICD-278.00) Discussed exercise and dietary changes.   Complete Medication List: 1)  Lisinopril-hydrochlorothiazide 20-25 Mg Tabs (Lisinopril-hydrochlorothiazide) .Marland Kitchen.. 1 tablet by mouth in the morning and 1 in the evening for your blood pressure and leg swelling. 2)  Hydrocodone-acetominophen 5/325  .... Take 1-2 tabs every 4-6 hrs as needed for pain. 3)  Norvasc 10 Mg Tabs (Amlodipine besylate) .Marland Kitchen.. 1 tab by mouth daily 4)  Tramadol Hcl 50 Mg Tabs (Tramadol hcl) .... Take 1 tablet every 12 hours for your joint pain.  Patient Instructions: 1)  I am so glad to hear you stopped using cocaine! Good job! 2)  I hope you do make the following changes to your diet: fewer fried foods, water instead of soda, more fruits and vegetables. 3)  Please look into places where you can swim, and if you are able, you should look into participating in water aerobics classes or try to swim for about 30 minutes. It would be nice if you could exercise 30 minutes a day, 5 days a week. 4)  Please make an appt to see me in 2 weeks.  5)  Please  increase your lisinopril/HCTZ to twice a week (same medicine but now twice instead of once a week).  6)  Try the Tramadol twice a day for your joint pain in the meantime, until I see you again.  7)  It was so nice to see you again.  Prescriptions: TRAMADOL HCL 50 MG TABS (TRAMADOL HCL) Take 1 tablet every 12 hours for your joint pain.  #40 x 0   Entered and Authorized by:  Karen Kays MD   Signed by:   Sheral Flow Park MD on 08/23/2010   Method used:   Print then Give to Patient   RxID:   [id number] LISINOPRIL-HYDROCHLOROTHIAZIDE 20-25 MG TABS (LISINOPRIL-HYDROCHLOROTHIAZIDE) 1 tablet by mouth in the morning and 1 in the evening for your blood pressure and leg swelling.  #60 x 0   Entered and Authorized by:   Karen Kays MD   Signed by:   Sheral Flow Park MD on 08/23/2010   Method used:   Print then Give to Patient   RxID:   [id number]    Orders Added: 1)  [facility]- Est Level  3 [89784]

## 2010-11-19 NOTE — Assessment & Plan Note (Signed)
Summary: Tobacco Cessation - Rx Clinic   Vital Signs:  Patient profile:   50 year old female Weight:      250 pounds BMI:     44.45 BP sitting:   133 / 93  (left arm)  Primary Care Jazlynne Milliner:  Karen Kays MD   History of Present Illness: Patient arrives in good spirits, ready but slightly anxious to quit smoking.   Patient is ready to quit smoking because of her health, but feels anxious. Most anxiety is about her addiction and duration of smoking history. She likes smoking, but doesn't want the health risk that go along with it.  Denies history of seizure.   Habits & Providers  Alcohol-Tobacco-Diet     Tobacco Status: current     Tobacco Counseling: Patient previously used nicotine patches and gum. She feels the gum did not work, but the patch did seem to help.     Pack years: 33.75  Current Medications (verified): 1)  Lisinopril-Hydrochlorothiazide 20-25 Mg Tabs (Lisinopril-Hydrochlorothiazide) .Marland Kitchen.. 1 Tablet By Mouth in The Morning and 1 in The Evening For Your Blood Pressure and Leg Swelling. 2)  Norvasc 10 Mg Tabs (Amlodipine Besylate) .Marland Kitchen.. 1 Tab By Mouth Daily 3)  Tramadol Hcl 50 Mg Tabs (Tramadol Hcl) .... Take 1 Tablet Every 12 Hours For Your Joint Pain.  Allergies (verified): No Known Drug Allergies   Impression & Recommendations:  Problem # 1:  TOBACCO DEPENDENCE (ICD-305.1)  Severe Nicotine Abuse of: 30 years duration in a patient who is:  good candidate for success b/c of: her willingness, motivation, and past positive attempts to quit. Initiated Buprpion today. Patient counseled on purpose, proper use (one tablet for one week then increase to twice daily), and potential adverse effects (patient stated no seizure history).  Written information provided: explaining dosing for bupropion   F/U Rx Clinic Visit: early december for quit date and transition to nicotine replacement therapy     Fiance may also be interested quitting in the near future. Total time with  patient in face-to-face counseling: 30   minutes.  Patient seen with: Ella Jubilee, PharmD resident, Ernst Bowler, PhamD Candidate.   Orders: Inital Assessment Each 36mn - [facility]([phone]  Complete Medication List: 1)  Lisinopril-hydrochlorothiazide 20-25 Mg Tabs (Lisinopril-hydrochlorothiazide) ..Marland Kitchen. 1 tablet by mouth in the morning and 1 in the evening for your blood pressure and leg swelling. 2)  Norvasc 10 Mg Tabs (Amlodipine besylate) ..Marland Kitchen. 1 tab by mouth daily 3)  Tramadol Hcl 50 Mg Tabs (Tramadol hcl) .... Take 1 tablet every 12 hours for your joint pain. 4)  Bupropion Hcl 150 Mg Xr12h-tab (Bupropion hcl) .... One daily for one week then one twice daily.   first dose in the am and second dose in early-mid afternoon.  Patient Instructions: 1)  Start bupropion once daily in the AM.  Take this for one week.  2)  Increase to twice daily with second dose in the early to mid afternoon after the first week.  3)  Cut down to 3/4 or 1 pack per day by your next visit in early December.  4)  Next Visit in Early December with Rx Clinic.  Prescriptions: BUPROPION HCL 150 MG XR12H-TAB (BUPROPION HCL) one daily for one week then one twice daily.   First dose in the AM and Second dose in early-mid afternoon.  #60 x 2   Entered by:   PRinaldo RatelD   Authorized by:   AKaren KaysMD  Signed by:   Janeann Forehand Pharm D on 08/27/2010   Method used:   Faxed to ...       [facility] (retail)       [address] [place], [place]  [address]       Ph: [phone]       Fax: [phone]   RxID:   [id number]    Orders Added: 1)  Inital Assessment Each 82mn - [facility]   Tobacco Counseling   Currently uses tobacco.    Cigarettes      Year started smoking cigarettes:      1966     Number of packs per day smoked:      0.75     Years smoked:            45     Packs per year:          273.75     Pack Years:            33.75  Counseled to quit/cut down on  tobacco use.   Readiness to Quit:     Somewhat Ready Cessation Stage:     Action Target Quit Date:     09/23/2010  Quitting Barriers:      -  spouse smokes     -  enjoys smoking     -  feels addicted  Quitting Motivators:      -  poor health     -  healthier lifestyle  Previous Quit Attempts   Previously Tried to quit:     Yes # of Previous quit attempts:     1 Longest Successful Quit Period:   6 months  Quit Methods Tried:      -  cold t[place]    -  nicotine patch     -  nicotine gum  Comments: Patient previously used nicotine patches and gum. She feels the gum did not work, but the patch did seem to help.  Smoking Cessation Plan   Readiness:     Somewhat Ready Cessation Stage:   Action Target Quit Date:   09/23/2010  Counseled on:      -  environment change     -  support network     -  routine/lifestyle  change  New Medications: LISINOPRIL-HYDROCHLOROTHIAZIDE 20-25 MG TABS (LISINOPRIL-HYDROCHLOROTHIAZIDE) 1 tablet by mouth in the morning and 1 in the evening for your blood pressure and leg swelling. BUPROPION HCL 150 MG XR12H-TAB (BUPROPION HCL) one daily for one week then one twice daily.   First dose in the AM and Second dose in early-mid afternoon.  Comments: Patient started on bupropion today (was counseled to take one tablet daily for one week then to start taking twice daily). Also couseled patient that medication would not completely cut out cravings, but that it should lower the cravings. Encouraged patient to use the next month or so to do her best to cut tobacco intake to around 3/4 to 1 pack/day. Will see patient back in earl december for official stop date at which time combination therapy with a nicotine patch and perhaps lozenges will begin.  Medications Added this Update:  LISINOPRIL-HYDROCHLOROTHIAZIDE 20-25 MG TABS (LISINOPRIL-HYDROCHLOROTHIAZIDE) 1 tablet by mouth in the morning and 1 in the evening for your blood pressure and leg  swelling. BUPROPION HCL 150 MG XR12H-TAB (BUPROPION HCL) one daily for one week then one twice daily.   First  dose in the AM and Second dose in early-mid afternoon.  Orders Added this Update:  Inital Assessment Each 85mn -[facility][[[id number]]

## 2010-11-19 NOTE — Assessment & Plan Note (Signed)
Summary: F/U BP   Vital Signs:  Patient profile:   50 year old female Height:      63 inches Weight:      245.19 pounds BMI:     43.59 BSA:     2.11 Temp:     98.0 degrees F Pulse rate:   88 / minute BP sitting:   151 / 104  Vitals Entered By: Christen Bame CMA (July 29, 2010 11:13 AM) CC: F/U BP Is Patient Diabetic? No Pain Assessment Patient in pain? no        Primary Care Provider:  Karen Kays MD  CC:  F/U BP.  History of Present Illness: 50 y/o F here for f/u of HTN. At last ov our plan was to continue on combo pill of lisinopril-hctz and norvasc.  Pt was to call me when she was out of her current BP med for me to refill with newer dose, but she forgot.  Pt has been out of lisinopril-hctz for 2 wks.   HYPERTENSION Disease Monitoring Blood pressure range: 150s-170s Medications: norvasc 10, hctz 25, lisinopril 40 Compliance: no, communication lapse and pt off of meds x 2 wks Lightheadedness: ni  Edema:yes   Chest pain: no  Dyspnea:no Prevention Exercise: not really Salt restriction:trying Weight change: none since last visit   Tobacco use: still smoking.  [facility] appt with Dr Valentina Lucks for Smoking Cessation.    Habits & Providers  Alcohol-Tobacco-Diet     Tobacco Status: current     Tobacco Counseling: to quit use of tobacco products     Cigarette Packs/Day: 0.75     Year Started: 1966  Current Medications (verified): 1)  Lisinopril-Hydrochlorothiazide 20-25 Mg Tabs (Lisinopril-Hydrochlorothiazide) .Marland Kitchen.. 1 Tab By Mouth Every Morning For Blood Pressue 2)  Hydrocodone-Acetominophen 5/325 .... Take 1-2 Tabs Every 4-6 Hrs As Needed For Pain. 3)  Norvasc 10 Mg Tabs (Amlodipine Besylate) .Marland Kitchen.. 1 Tab By Mouth Daily  Allergies (verified): No Known Drug Allergies  Review of Systems       per hpi   Physical Exam  General:  Well-developed,well-nourished,in no acute distress; alert,appropriate and cooperative throughout examination. vitals reviewed.  Lungs:   Normal respiratory effort, chest expands symmetrically. Lungs are clear to auscultation, no crackles or wheezes. Heart:  Normal rate and regular rhythm. S1 and S2 normal without gallop, murmur, click, rub or other extra sounds. no bruit.  Pulses:  R and L carotid,radial,fdorsalis pedis  pulses are full and equal bilaterally Extremities:  2+ left pedal edema and 2+ right pedal edema.   Neurologic:  alert & oriented X3.     Impression & Recommendations:  Problem # 1:  HYPERTENSION, SEVERE (ICD-401.0) Assessment Unchanged BP no change from previous, but DBP more elevated.  She has been off hctz & lisinopril x 2 wks.  We resume these meds.  Will cont norvasc 10.  Pt to f/u in 4 weeks.  May repeat Bmet at that time to monitor electrolytes.   The following medications were removed from the medication list:    Furosemide 40 Mg Tabs (Furosemide) .Marland Kitchen... 1 tab by mouth daily for swelling Her updated medication list for this problem includes:    Lisinopril-hydrochlorothiazide 20-25 Mg Tabs (Lisinopril-hydrochlorothiazide) .Marland Kitchen... 1 tab by mouth every morning for blood pressue    Norvasc 10 Mg Tabs (Amlodipine besylate) .Marland Kitchen... 1 tab by mouth daily  Orders: [facility]- Est Level  3 ([id number])  Problem # 2:  LEG EDEMA, BILATERAL (ICD-782.3) Assessment: Deteriorated We stopped Lasix at last ov (  Echo with normal systolic fxn) and pt been off of hctz x 2 wks.  She is asymptomatic.  Will resume HCTZ.  The following medications were removed from the medication list:    Furosemide 40 Mg Tabs (Furosemide) .Marland Kitchen... 1 tab by mouth daily for swelling Her updated medication list for this problem includes:    Lisinopril-hydrochlorothiazide 20-25 Mg Tabs (Lisinopril-hydrochlorothiazide) .Marland Kitchen... 1 tab by mouth every morning for blood pressue  Orders: [place]- Est Level  3 ([id number])  Problem # 3:  TOBACCO DEPENDENCE (ICD-305.1) Assessment: Comment Only Missed appt with Dr Valentina Lucks for smoking cessation.  Encouraged pt to make appt when  ready to quit smoking.   Complete Medication List: 1)  Lisinopril-hydrochlorothiazide 20-25 Mg Tabs (Lisinopril-hydrochlorothiazide) .Marland Kitchen.. 1 tab by mouth every morning for blood pressue 2)  Hydrocodone-acetominophen 5/325  .... Take 1-2 tabs every 4-6 hrs as needed for pain. 3)  Norvasc 10 Mg Tabs (Amlodipine besylate) .Marland Kitchen.. 1 tab by mouth daily  Patient Instructions: 1)  Please schedule a follow-up appointment in 4 weeks for Blood pressure. 2)  Make another appointment with Pharmacy Clinic for Smoking Cessation.  3)  I'll check labs at next visit. 4)  For blood pressure: Norvasc 41m once a day, the combo pill Lisionopril 10-HCTZ 25 once a day.   5)  Tobacco is very bad for your health and your loved ones ! You should stop smoking !  6)  Stop smoking tips: Choose a quit date. Cut down before the quit date. Decide what you will do as a substitute when you feel the urge to smoke(gum, toothpick, exercise).  Prescriptions: LISINOPRIL-HYDROCHLOROTHIAZIDE 20-25 MG TABS (LISINOPRIL-HYDROCHLOROTHIAZIDE) 1 tab by mouth every morning for blood pressue  #30 x 3   Entered and Authorized by:   CMerla RichesMD   Signed by:   CMerla RichesMD on 07/29/2010   Method used:   [facility]ed to ...       [facility])       [address]      [place]  [address]      Ph: [phone]      Fax: [phone]  RxID:   [id number]

## 2010-11-19 NOTE — Assessment & Plan Note (Signed)
Summary: F/U/KH   Vital Signs:  Patient profile:   49 year old female Weight:      233.7 pounds Temp:     97.8 degrees F oral Pulse rate:   92 / minute Pulse rhythm:   regular BP sitting:   158 / 98 Cuff size:   large  Vitals Entered By: Audelia Hives CMA (June 27, 2010 2:39 PM) CC: f/u HTN, edema   Primary Care Provider:  Karen Kays MD  CC:  f/u HTN and edema.  History of Present Illness: 50 y/o F here for HTN f/u. 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  She is currently clean, about 70 days.   HYPERTENSION Disease Monitoring Blood pressure range: 170s/100-120s Medications: Lisinopril 20, HCTZ 12.5 , Norvasc 10, Lasix 40  Compliance: yes  Lightheadedness: no Edema: yes, but better  Chest pain: no  Dyspnea:no   Prevention Exercise: no Salt restriction:yes  LE edema is much improved from previous She remains off of cocaine   Dysuria x 2 days:  At first she had decreased frequency.  This morning had burning sensation at end of void.  +frequency.  -fever. +chills.  Habits & Providers  Alcohol-Tobacco-Diet     Tobacco Status: current     Tobacco Counseling: to quit use of tobacco products     Cigarette Packs/Day: 0.75     Year Started: 1966  Allergies (verified): No Known Drug Allergies  Past History:  Past Medical History: Last updated: 05/26/2010 G1P1, h/o fibroidectomy, h/o LTCS, h/o PID HTN Morbid obesity  Past Surgical History: Last updated: 01-15-2007 Lipid Panel 11/27/06 TC=186, TG=116, HDL=55, LDL =108 - 11/30/2006  Family History: Last updated: 2007-01-15 dad-died from sepsis, dad-dm, htn, mom-living with htn  Social History: Last updated: 06/27/2010 Denies other drug use, no EtOH, smokes 3/4 ppd.  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 Employer told her to take time off until high BP & other health issues resolved.   Risk Factors: Smoking Status: current (06/27/2010) Packs/Day: 0.75 (06/27/2010)  Social History: Denies other  drug use, no EtOH, smokes 3/4 ppd.  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 Employer told her to take time off until high BP & other health issues resolved.   Review of Systems       per hpi  Physical Exam  General:  Well-developed,well-nourished,in no acute distress; alert,appropriate and cooperative throughout examination. vitals reviewed.  Neck:  No bruit Lungs:  Normal respiratory effort, chest expands symmetrically. Lungs are clear to auscultation, no crackles or wheezes. Heart:  Normal rate and regular rhythm. S1 and S2 normal without gallop, murmur, click, rub or other extra sounds. Pulses:  R dorsalis pedis normal and L dorsalis pedis normal.   Extremities:  trace left pedal edema and trace right pedal edema.   Neurologic:  alert & oriented X3.   Skin:  Intact without suspicious lesions or rashes   Impression & Recommendations:  Problem # 1:  HYPERTENSION, SEVERE (ICD-401.0) Assessment Unchanged BP improved but still not at goal of <140/90.  LE edema is also improved since pt was taking Lasix 79m x 1 wk.  2D Echo 06/26/10 showed normal systolic function, mild LVH, EF 55-60%.  I do not understand her current need for Lasix.  She had Echo in 2009 with similar result, but she was given Lasix in past for LE edema.  At this time I will try to take pt off of Lasix again and monitor for edema.  Pt to call me if edema returns.  To  control BP better, will double Lisinopril 20/HCTZ12.5.  Increasing Lisinopril may not help with systolic BP, but I hope that the increased HCTZ will help with edema and diastolic BP.  Pt to rtc in 4 wks.  If BP still not at goal, will add Hydralazine.    Her updated medication list for this problem includes:    Lisinopril-hydrochlorothiazide 20-12.5 Mg Tabs (Lisinopril-hydrochlorothiazide) .Marland Kitchen... Take 2 tablet by mouth daily.    Furosemide 40 Mg Tabs (Furosemide) .Marland Kitchen... 1 tab by mouth daily for swelling    Norvasc 10 Mg Tabs (Amlodipine besylate) .Marland Kitchen... 1 tab by mouth  daily  Orders: [place]- Est  Level 4 (70177)  Problem # 2:  DYSURIA (ICD-788.1) Assessment: New UA shows moderate leuk, and positive nitrites. Will treat with Keflex 536m  two times a day x 7 days.    Orders: [place] (00000) [place]  Level 4 (([id number] Urine Culture-[facility] (([id number]  Her updated medication list for this problem includes:    Cephalexin 250 Mg Caps (Cephalexin) ..Marland Kitchen.. 2 tabs by mouth two times a day for 7 days  Complete Medication List: 1)  Lisinopril-hydrochlorothiazide 20-12.5 Mg Tabs (Lisinopril-hydrochlorothiazide) .... Take 2 tablet by mouth daily. 2)  Hydrocodone-acetominophen 5/325  .... Take 1-2 tabs every 4-6 hrs as needed for pain. 3)  Furosemide 40 Mg Tabs (Furosemide) ..Marland Kitchen. 1 tab by mouth daily for swelling 4)  Potassium Chloride Crys Cr 20 Meq Cr-tabs (Potassium chloride crys cr) ..Marland Kitchen. 1 tab by mouth daily with lasix 5)  Norvasc 10 Mg Tabs (Amlodipine besylate) ..Marland Kitchen. 1 tab by mouth daily 6)  Cephalexin 250 Mg Caps (Cephalexin) .... 2 tabs by mouth two times a day for 7 days  Patient Instructions: 1)  Please schedule a follow-up appointment in 4 weeks for blood pressure.  2)  New regimen:  3)  Double up on the combo pill, so take 2 tabs in morning. 4)  Norvasc 150mdaily. 5)  Stop Lasix. 6)  Stop Potassium. 7)  For bladder infection: take 2 tabs of Cephalexin two times a day for 7 days  Prescriptions: CEPHALEXIN 250 MG CAPS (CEPHALEXIN) 2 tabs by mouth two times a day for 7 days  #28 x 0   Entered and Authorized by:   CaMerla RichesD   Signed by:   DoHedy Camaran 06/27/2010   Method used:   Telephoned to ...       [phone])       [address]     [place]  [id number]     Ph: [phone]     Fax: [phone] RxID:   [id number] Laboratory Results   Urine Tests  Date/Time Received: June 27, 2010 2:57 PM  Date/Time Reported: June 27, 2010 3:25 PM   Routine Urinalysis   Color:  yellow Appearance: Hazy Glucose: negative   (Normal Range: Negative) Bilirubin: negative   (Normal Range: Negative) Ketone: negative   (Normal Range: Negative) Spec. Gravity: 1.025   (Normal Range: 1.003-1.035) Blood: small   (Normal Range: Negative) pH: 5.5   (Normal Range: 5.0-8.0) Protein: 30   (Normal Range: Negative) Urobilinogen: 0.2   (Normal Range: 0-1) Nitrite: positive   (Normal Range: Negative) Leukocyte Esterace: moderate   (Normal Range: Negative)  Urine Microscopic WBC/HPF: 20+ RBC/HPF: 5-10 Bacteria/HPF: 3+ Epithelial/HPF: 5-10    Comments: ...........test performed by............DMarland KitchenHedy CamaraCMA

## 2010-11-19 NOTE — Initial Assessments (Signed)
Vital Signs:  Patient profile:   50 year old female O2 Sat:      98 % on Room air Temp:     98.6 degrees F Pulse rate:   90 / minute Resp:     16 per minute BP supine:   158 / 92  O2 Flow:  Room air  CC:  Headache and High Blood Pressure.  History of Present Illness: Pt is a 50 yo F with high blood pressure, headache, and chest pain  1. High blood pressure:  She has a hx of HTN.  She hasn't been taking any of her blood pressure medicines for over 1 year.  She has also been abusing crack cocaine (last use 27 days ago).  Her BP at [facility] was 200/143 but came down to 158/95 after Labetalol.  2. Headache:  She has a hx of headaches.  She normally gets headaches.  This one is more severe than the ones that she normally gets.  She thinks that she has an association with high blood pressure and her headaches.  She has been having them off and on for years but more frequently the past couple of days. - Location: frontal - Description: squeezing / pressure - Severity: 7/10  3. Chest pain:  Has been having chest pain off and on for the past couple of months.  She thinks that she normally gets chest pain when her blood pressure is elevated.  It is located in her left chest.  It is not associated with exertion and not improved with rest.  She has not been taking anything for it.  ROS:  - Denies fevers, vision changes, hematuria, abdominal pain, back pain, rashes, numbness / weakness.  - Endorses some tingling in her hands, LE swelling  Meds:  Not taking any of her prescribed medications    Current Medications (verified): 1)  Lisinopril-Hydrochlorothiazide 10-12.5 Mg  Tabs (Lisinopril-Hydrochlorothiazide) .... 2 Tabs By Mouth Daily 2)  Diphenhydramine Hcl 25 Mg  Caps (Diphenhydramine Hcl) .Marland Kitchen.. 1-2 Tabs By Mouth Q6h As Needed Itching 3)  Loratadine 10 Mg  Tabs (Loratadine) .... Once Daily As Needed Allergies 4)  Furosemide 20 Mg  Tabs (Furosemide) .Marland Kitchen.. 1 Tablet By Mouth Qam 5)  Cardizem La  240 Mg Xr24h-Tab (Diltiazem Hcl Coated Beads) .Marland Kitchen.. 1 Tab By Mouth Daily 6)  Klor-Con M20 20 Meq Cr-Tabs (Potassium Chloride Crys Cr) .... 2 Tabs By Mouth Daily  Allergies (verified): No Known Drug Allergies  Past History:  Past Medical History: G1P1, h/o fibroidectomy, h/o LTCS, h/o PID HTN Morbid obesity  Family History: Reviewed history from 12/17/2006 and no changes required. dad-died from sepsis, dad-dm, htn, mom-living with htn  Social History: Reviewed history from 04/26/2008 and no changes required. Hx of crack use.  Last use was around 1 month ago. Denies other drug use, no EtOH, smokes 1 ppd.  Lost house and job while using.  Lives with fiance  Physical Exam  General:  Vitals reviewed.  No acute distress.  Normally interactive Head:  normocephalic and atraumatic.   Eyes:  No corneal or conjunctival inflammation noted. EOMI. Perrla. Vision grossly normal. Mouth:  tachy MM.  OP pink and moist Neck:  supple, full ROM, and no masses.  No bruits.  No meningeal signs Lungs:  Clear to auscultation bilateral with normal work of breathing. Heart:  Regular rate and rhythm without murmurs, rubs, or gallops.  Normal precordium. Abdomen:  Bowel sounds positive,abdomen soft and non-tender without masses, organomegaly or hernias noted.  Extremities:  trace bilateral lower extremity edema Neurologic:  CNII-XII intact.  Normal strength and sensation.  No focal deficits.   Skin:  Multiple circular scars.  No abscesses, no signs of infection. Cervical Nodes:  no anterior cervical adenopathy.   Psych:  normally interactive, good eye contact, not anxious appearing, and not depressed appearing.   Additional Exam:  1. U Preg: neg 2. UA: large blood, neg protein, small LE 3. UMicro: 0-2 WBC, 0-2 RBC, few bacteria 4. POC CEs: neg x 2 5. Head CT: neg 6. CXR: neg 7. CBC: 10>12.5<280 8. BMET: 138 / 3.8 / 102 / 31 / 9 / 0.8 / 88 9. CXR: NACPD    Impression & Recommendations:  Problem #  1:  HYPERTENSION, SEVERE (ICD-401.0) Assessment New Hypertensive Urgency.  No signs of end organ damage.  Will place pt back on her home blood pressure medicines.  BP responded to Labetalol so will use that prn for SBP > 180 Her updated medication list for this problem includes:    Lisinopril-hydrochlorothiazide 10-12.5 Mg Tabs (Lisinopril-hydrochlorothiazide) .Marland Kitchen... 2 tabs by mouth daily    Furosemide 20 Mg Tabs (Furosemide) .Marland Kitchen... 1 tablet by mouth qam    Cardizem La 240 Mg Xr24h-tab (Diltiazem hcl coated beads) .Marland Kitchen... 1 tab by mouth daily  Problem # 2:  HEADACHE (ICD-784.0) Assessment: New Most likely 2/2 to HTN.  Negative Head CT.  Will control BP.  Will use Tylenol / Ibuprofen because of her substance abuse hx.  Problem # 3:  CHEST PAIN (ICD-786.50) Assessment: New Atypical chest pain.  Will rule out ACS with CEs and repeat EKG in the morning.  Problem # 4:  SUBSTANCE ABUSE (ICD-305.90) Assessment: Improved Will check UDS. FEN/GI: Low Na Heart Healthy diet.  KVOIVF.  PPx: Heparin  Dispo: Pending clinical improvement  Complete Medication List: 1)  Lisinopril-hydrochlorothiazide 10-12.5 Mg Tabs (Lisinopril-hydrochlorothiazide) .... 2 tabs by mouth daily 2)  Diphenhydramine Hcl 25 Mg Caps (Diphenhydramine hcl) .Marland Kitchen.. 1-2 tabs by mouth q6h as needed itching 3)  Loratadine 10 Mg Tabs (Loratadine) .... Once daily as needed allergies 4)  Furosemide 20 Mg Tabs (Furosemide) .Marland Kitchen.. 1 tablet by mouth qam 5)  Cardizem La 240 Mg Xr24h-tab (Diltiazem hcl coated beads) .Marland Kitchen.. 1 tab by mouth daily 6)  Klor-con M20 20 Meq Cr-tabs (Potassium chloride crys cr) .... 2 tabs by mouth daily

## 2010-12-03 ENCOUNTER — Encounter: Payer: Self-pay | Admitting: *Deleted

## 2010-12-07 ENCOUNTER — Inpatient Hospital Stay (HOSPITAL_COMMUNITY)
Admission: AD | Admit: 2010-12-07 | Discharge: 2010-12-07 | Disposition: A | Discharge status: Home / Self Care | Payer: Self-pay | Source: Ambulatory Visit | Attending: Obstetrics & Gynecology | Admitting: Obstetrics & Gynecology

## 2010-12-07 DIAGNOSIS — M549 Dorsalgia, unspecified: Secondary | ICD-10-CM

## 2010-12-07 DIAGNOSIS — N12 Tubulo-interstitial nephritis, not specified as acute or chronic: Secondary | ICD-10-CM | POA: Insufficient documentation

## 2010-12-07 LAB — URINALYSIS, ROUTINE W REFLEX MICROSCOPIC
Bilirubin Urine: NEGATIVE
Ketones, ur: NEGATIVE mg/dL
Nitrite: POSITIVE — AB
Protein, ur: NEGATIVE mg/dL
Urine Glucose, Fasting: NEGATIVE mg/dL
pH: 5.5 (ref 5.0–8.0)

## 2010-12-07 LAB — CBC
Hemoglobin: 12.8 g/dL (ref 12.0–15.0)
Platelets: 273 10*3/uL (ref 150–400)
RBC: 4.94 MIL/uL (ref 3.87–5.11)
WBC: 12.9 10*3/uL — ABNORMAL HIGH (ref 4.0–10.5)

## 2010-12-07 LAB — POCT PREGNANCY, URINE: Preg Test, Ur: NEGATIVE

## 2010-12-07 LAB — URINE MICROSCOPIC-ADD ON

## 2010-12-12 ENCOUNTER — Ambulatory Visit (INDEPENDENT_AMBULATORY_CARE_PROVIDER_SITE_OTHER): Payer: Self-pay | Admitting: Family Medicine

## 2010-12-12 ENCOUNTER — Encounter: Payer: Self-pay | Admitting: Family Medicine

## 2010-12-12 DIAGNOSIS — R209 Unspecified disturbances of skin sensation: Secondary | ICD-10-CM

## 2010-12-12 DIAGNOSIS — R609 Edema, unspecified: Secondary | ICD-10-CM

## 2010-12-12 DIAGNOSIS — F17200 Nicotine dependence, unspecified, uncomplicated: Secondary | ICD-10-CM

## 2010-12-12 DIAGNOSIS — E669 Obesity, unspecified: Secondary | ICD-10-CM

## 2010-12-12 DIAGNOSIS — R202 Paresthesia of skin: Secondary | ICD-10-CM | POA: Insufficient documentation

## 2010-12-12 DIAGNOSIS — I10 Essential (primary) hypertension: Secondary | ICD-10-CM

## 2010-12-12 DIAGNOSIS — M2550 Pain in unspecified joint: Secondary | ICD-10-CM | POA: Insufficient documentation

## 2010-12-12 HISTORY — DX: Paresthesia of skin: R20.2

## 2010-12-12 LAB — BASIC METABOLIC PANEL
Chloride: 101 mEq/L (ref 96–112)
Potassium: 3.6 mEq/L (ref 3.5–5.3)

## 2010-12-12 LAB — CONVERTED CEMR LAB
CO2: 26 meq/L (ref 19–32)
Calcium: 9.7 mg/dL (ref 8.4–10.5)
Creatinine, Ser: 0.97 mg/dL (ref 0.40–1.20)
HCT: 40.4 % (ref 36.0–46.0)
MCHC: 33.4 g/dL (ref 30.0–36.0)
MCV: 79.1 fL (ref 78.0–100.0)
Platelets: 322 10*3/uL (ref 150–400)
RDW: 14.9 % (ref 11.5–15.5)
WBC: 14.5 10*3/uL — ABNORMAL HIGH (ref 4.0–10.5)

## 2010-12-12 LAB — CBC
HCT: 40.4 % (ref 36.0–46.0)
Hemoglobin: 13.5 g/dL (ref 12.0–15.0)
MCH: 26.4 pg (ref 26.0–34.0)
MCV: 79.1 fL (ref 78.0–100.0)
RBC: 5.11 MIL/uL (ref 3.87–5.11)

## 2010-12-12 LAB — URIC ACID: Uric Acid, Serum: 6.7 mg/dL (ref 2.4–7.0)

## 2010-12-12 MED ORDER — BUPROPION HCL 100 MG PO TABS: 100.0000 mg | ORAL_TABLET | Freq: Three times a day (TID) | ORAL | Status: DC

## 2010-12-12 MED ORDER — LISINOPRIL-HYDROCHLOROTHIAZIDE 20-25 MG PO TABS: 1.0000 | ORAL_TABLET | Freq: Two times a day (BID) | ORAL | Status: DC

## 2010-12-12 MED ORDER — AMLODIPINE BESYLATE 10 MG PO TABS: 10.0000 mg | ORAL_TABLET | Freq: Every day | ORAL | Status: DC

## 2010-12-12 NOTE — Progress Notes (Signed)
  Subjective:    Patient ID: Hailey Barnes, female    DOB: December 25, 1960, 50 y.o.   MRN: [id number]  Hypertension  Hand Pain    1. Smoking Started smoking again. Previous QD was December. Now smoking 1 ppd. Was 3/4 ppd at last visit. [facility] did not carry bupropion and so did not fill. Thought it would be expensive. Wants to quit.   2. Joint pain and tingling Used to have knee pain. Now pain in first MTP joints bilaterally.   Worsened by: when using thumbs (e.g., pushing cart at work). Does not hurt more at particular time of day.   Improved by: nothing that she knows of. Has not tried any medications. Not taking Tramadol; just did not take.  Also tingling in both hands during night along lateral thumbs. Occasional decreased sensation at nighttime only.   3. HTN Compliant with medications.  Denies chest pain, edema.   4. Obesity Did not implement changes discussed at last visit: fewer fried foods, more water/less soda, more fruits/veg. Aware needs to lose weight. Enjoys walking.   Review of Systems See HPI. Other ROS: no fevers; did fall on left hand a few months ago; denies sore throat/cough; denies rash.     Objective:   Physical Exam  Constitutional: No distress.       obese  Neck: No thyromegaly present.  Cardiovascular: Normal rate, regular rhythm and intact distal pulses.   No murmur heard. Pulmonary/Chest: Effort normal and breath sounds normal. She has no wheezes. She has no rales.  Abdominal: Bowel sounds are normal.       obese  Musculoskeletal:       Hands: no erythema, joint swelling, numbness bilaterally; moderate TTP first MTP joints bilaterally; ?mildly enlarged DIP joints 1st and 2nd fingers bilaterally; full ROM Knees: no swelling, erythema, pain, occasional crepitus 1-2+ pitting edema halfway up shins            Assessment & Plan:

## 2010-12-12 NOTE — Assessment & Plan Note (Addendum)
Likely 2/2 osteoarthritis. Only tenderness concerning on PE.  Will check uric acid to r/o gout. Will manage conservatively with Tylenol, and then NSAID if that doesn't work. Also may try cool compresses. Given information on arthritis.

## 2010-12-12 NOTE — Assessment & Plan Note (Signed)
Well controlled on current medications. Will continue. May be able to stop Norvasc if BP remains good. Discussed smoking cessation. Will f/u 1 month.

## 2010-12-12 NOTE — Assessment & Plan Note (Signed)
Persistent. Will consider stopping Norvasc in 1 month @ f/u in BP still good.

## 2010-12-12 NOTE — Patient Instructions (Addendum)
I will call you regarding lab results.  Please schedule mammogram. Please schedule f/u with me for Pap smear and f/u of smoking and blood pressure in 1 month. Please try the medicine to help stop smoking and nicotine patches. I believe you are capable of doing this! Congratulations on your marriage to [place]!!! Try those diet changes and try walking at least 3 x a week.  Try Tylenol 672m every 6 hours for your joint pain. If this doesn't help, try ibuprofen every 6 hours as needed for the pain. Also try cool compresses.

## 2010-12-12 NOTE — Assessment & Plan Note (Addendum)
No weakness, fever, numbness, or other red flags. Will check electrolytes. Does not cause functional impairment or significant distress. May also be mild carpal tunnel. Already on HCTZ. If worsens, consider wrist split/NSAIDs.

## 2010-12-12 NOTE — Assessment & Plan Note (Signed)
Re-discussed changes in diet. Patient will try eat fewer fried fruits and more fruits/vegetables. Will try to walk at least 3x daily. Aware that weight contributing to previous knee problems.

## 2010-12-12 NOTE — Assessment & Plan Note (Signed)
Discussed how this may be exacerbating joint problems and may cause other serious health problems in the future. Will try bupropion. Assured medication should be generic and not expensive. Will also try nicotine patches which she has tried in the past. Cautioned not to smoke while on patches. Will f/u in 1 month.

## 2010-12-17 ENCOUNTER — Encounter: Payer: Self-pay | Admitting: Family Medicine

## 2010-12-19 ENCOUNTER — Encounter: Payer: Self-pay | Admitting: Family Medicine

## 2010-12-20 ENCOUNTER — Other Ambulatory Visit: Payer: Self-pay | Admitting: Family Medicine

## 2010-12-20 ENCOUNTER — Telehealth: Payer: Self-pay | Admitting: Family Medicine

## 2010-12-20 DIAGNOSIS — Z1231 Encounter for screening mammogram for malignant neoplasm of breast: Secondary | ICD-10-CM

## 2010-12-20 NOTE — Telephone Encounter (Signed)
Pt returning call, thinks its re: test results, was called yesterday

## 2010-12-20 NOTE — Telephone Encounter (Signed)
Pt informed that MD did call and sent a letter.  Read the letter to patient, she is agreeable and will call ack to schedule appt. Neco Kling, Salome Spotted

## 2010-12-24 ENCOUNTER — Ambulatory Visit (HOSPITAL_COMMUNITY)
Admission: RE | Admit: 2010-12-24 | Discharge: 2010-12-24 | Disposition: A | Discharge status: Home / Self Care | Payer: Self-pay | Source: Ambulatory Visit | Attending: Family Medicine | Admitting: Family Medicine

## 2010-12-24 DIAGNOSIS — Z1231 Encounter for screening mammogram for malignant neoplasm of breast: Secondary | ICD-10-CM

## 2010-12-24 DIAGNOSIS — R928 Other abnormal and inconclusive findings on diagnostic imaging of breast: Secondary | ICD-10-CM | POA: Insufficient documentation

## 2011-01-02 LAB — URINALYSIS, ROUTINE W REFLEX MICROSCOPIC
Bilirubin Urine: NEGATIVE
Ketones, ur: NEGATIVE mg/dL
Nitrite: NEGATIVE
Urobilinogen, UA: 0.2 mg/dL (ref 0.0–1.0)

## 2011-01-03 ENCOUNTER — Other Ambulatory Visit: Payer: Self-pay | Admitting: Family Medicine

## 2011-01-03 ENCOUNTER — Ambulatory Visit: Payer: Self-pay | Admitting: Family Medicine

## 2011-01-03 DIAGNOSIS — R928 Other abnormal and inconclusive findings on diagnostic imaging of breast: Secondary | ICD-10-CM

## 2011-01-03 LAB — RAPID URINE DRUG SCREEN, HOSP PERFORMED
Amphetamines: NOT DETECTED
Benzodiazepines: NOT DETECTED
Tetrahydrocannabinol: NOT DETECTED

## 2011-01-03 LAB — DIFFERENTIAL
Basophils Absolute: 0 10*3/uL (ref 0.0–0.1)
Basophils Absolute: 0 10*3/uL (ref 0.0–0.1)
Basophils Relative: 0 % (ref 0–1)
Eosinophils Absolute: 0.2 10*3/uL (ref 0.0–0.7)
Eosinophils Relative: 1 % (ref 0–5)
Eosinophils Relative: 2 % (ref 0–5)
Lymphocytes Relative: 23 % (ref 12–46)
Lymphocytes Relative: 27 % (ref 12–46)
Lymphs Abs: 3 10*3/uL (ref 0.7–4.0)
Monocytes Absolute: 0.8 10*3/uL (ref 0.1–1.0)
Monocytes Absolute: 0.9 10*3/uL (ref 0.1–1.0)
Monocytes Relative: 7 % (ref 3–12)
Neutro Abs: 6.3 10*3/uL (ref 1.7–7.7)
Neutro Abs: 9.3 10*3/uL — ABNORMAL HIGH (ref 1.7–7.7)
Neutrophils Relative %: 63 % (ref 43–77)

## 2011-01-03 LAB — CARDIAC PANEL(CRET KIN+CKTOT+MB+TROPI)
CK, MB: 1.6 ng/mL (ref 0.3–4.0)
Total CK: 127 U/L (ref 7–177)
Total CK: 168 U/L (ref 7–177)
Troponin I: 0.01 ng/mL (ref 0.00–0.06)

## 2011-01-03 LAB — POCT CARDIAC MARKERS
CKMB, poc: 1.1 ng/mL (ref 1.0–8.0)
CKMB, poc: 1.8 ng/mL (ref 1.0–8.0)
Myoglobin, poc: 43.2 ng/mL (ref 12–200)
Myoglobin, poc: 44.2 ng/mL (ref 12–200)
Myoglobin, poc: 65.2 ng/mL (ref 12–200)
Troponin i, poc: <0.05 ng/mL (ref 0.00–0.09)
Troponin i, poc: <0.05 ng/mL (ref 0.00–0.09)

## 2011-01-03 LAB — CBC
HCT: 38.8 % (ref 36.0–46.0)
Hemoglobin: 12.5 g/dL (ref 12.0–15.0)
MCH: 24.5 pg — ABNORMAL LOW (ref 26.0–34.0)
MCH: 24.6 pg — ABNORMAL LOW (ref 26.0–34.0)
Platelets: 244 10*3/uL (ref 150–400)
RBC: 5.08 MIL/uL (ref 3.87–5.11)
RBC: 5.11 MIL/uL (ref 3.87–5.11)
RDW: 19.9 % — ABNORMAL HIGH (ref 11.5–15.5)
WBC: 13.3 10*3/uL — ABNORMAL HIGH (ref 4.0–10.5)

## 2011-01-03 LAB — COMPREHENSIVE METABOLIC PANEL
AST: 18 U/L (ref 0–37)
Albumin: 3.5 g/dL (ref 3.5–5.2)
BUN: 11 mg/dL (ref 6–23)
Chloride: 104 mEq/L (ref 96–112)
Creatinine, Ser: 0.93 mg/dL (ref 0.4–1.2)
GFR calc Af Amer: >60 mL/min (ref >60)
Total Bilirubin: 0.3 mg/dL (ref 0.3–1.2)
Total Protein: 6.8 g/dL (ref 6.0–8.3)

## 2011-01-03 LAB — BASIC METABOLIC PANEL
BUN: 12 mg/dL (ref 6–23)
CO2: 31 mEq/L (ref 19–32)
Calcium: 9 mg/dL (ref 8.4–10.5)
Calcium: 9.4 mg/dL (ref 8.4–10.5)
Creatinine, Ser: 0.89 mg/dL (ref 0.4–1.2)
GFR calc Af Amer: >60 mL/min (ref >60)
GFR calc Af Amer: >60 mL/min (ref >60)
GFR calc non Af Amer: >60 mL/min (ref >60)
Glucose, Bld: 88 mg/dL (ref 70–99)
Potassium: 3.8 mEq/L (ref 3.5–5.1)
Sodium: 138 mEq/L (ref 135–145)
Sodium: 139 mEq/L (ref 135–145)

## 2011-01-03 LAB — WET PREP, GENITAL
Trich, Wet Prep: NONE SEEN
Yeast Wet Prep HPF POC: NONE SEEN

## 2011-01-03 LAB — URINALYSIS, ROUTINE W REFLEX MICROSCOPIC
Bilirubin Urine: NEGATIVE
Ketones, ur: NEGATIVE mg/dL
Nitrite: NEGATIVE
Protein, ur: NEGATIVE mg/dL
Urobilinogen, UA: 0.2 mg/dL (ref 0.0–1.0)
pH: 7 (ref 5.0–8.0)

## 2011-01-03 LAB — MRSA PCR SCREENING: MRSA by PCR: NEGATIVE

## 2011-01-03 LAB — URINE MICROSCOPIC-ADD ON

## 2011-01-03 LAB — GC/CHLAMYDIA PROBE AMP, GENITAL: Chlamydia, DNA Probe: NEGATIVE

## 2011-01-10 ENCOUNTER — Ambulatory Visit
Admission: RE | Admit: 2011-01-10 | Discharge: 2011-01-10 | Disposition: A | Discharge status: Home / Self Care | Payer: Self-pay | Source: Ambulatory Visit | Attending: Family Medicine | Admitting: Family Medicine

## 2011-01-10 DIAGNOSIS — R928 Other abnormal and inconclusive findings on diagnostic imaging of breast: Secondary | ICD-10-CM

## 2011-01-23 LAB — URINALYSIS, ROUTINE W REFLEX MICROSCOPIC
Bilirubin Urine: NEGATIVE
Glucose, UA: NEGATIVE mg/dL
Protein, ur: NEGATIVE mg/dL
Urobilinogen, UA: 0.2 mg/dL (ref 0.0–1.0)

## 2011-01-23 LAB — CBC
HCT: 35.6 % — ABNORMAL LOW (ref 36.0–46.0)
Hemoglobin: 11.3 g/dL — ABNORMAL LOW (ref 12.0–15.0)
Platelets: 384 10*3/uL (ref 150–400)
RDW: 18.6 % — ABNORMAL HIGH (ref 11.5–15.5)
WBC: 16.3 10*3/uL — ABNORMAL HIGH (ref 4.0–10.5)

## 2011-01-23 LAB — DIFFERENTIAL
Basophils Absolute: 0 10*3/uL (ref 0.0–0.1)
Eosinophils Relative: 0 % (ref 0–5)
Lymphocytes Relative: 18 % (ref 12–46)
Lymphs Abs: 3 10*3/uL (ref 0.7–4.0)
Neutro Abs: 12.1 10*3/uL — ABNORMAL HIGH (ref 1.7–7.7)

## 2011-01-23 LAB — URINE MICROSCOPIC-ADD ON

## 2011-01-24 LAB — RAPID URINE DRUG SCREEN, HOSP PERFORMED
Amphetamines: NOT DETECTED
Barbiturates: NOT DETECTED
Benzodiazepines: NOT DETECTED
Cocaine: NOT DETECTED
Opiates: NOT DETECTED
Tetrahydrocannabinol: NOT DETECTED

## 2011-01-24 LAB — URINALYSIS, ROUTINE W REFLEX MICROSCOPIC
Glucose, UA: NEGATIVE mg/dL
Glucose, UA: NEGATIVE mg/dL
Ketones, ur: NEGATIVE mg/dL
Nitrite: NEGATIVE
Specific Gravity, Urine: 1.01 (ref 1.005–1.030)
Specific Gravity, Urine: 1.01 (ref 1.005–1.030)
pH: 6 (ref 5.0–8.0)

## 2011-01-24 LAB — POCT I-STAT, CHEM 8
BUN: 8 mg/dL (ref 6–23)
Calcium, Ion: 1.13 mmol/L (ref 1.12–1.32)
Chloride: 106 meq/L (ref 96–112)
Creatinine, Ser: 1.1 mg/dL (ref 0.4–1.2)
Glucose, Bld: 104 mg/dL — ABNORMAL HIGH (ref 70–99)
HCT: 37 % (ref 36.0–46.0)
Hemoglobin: 12.6 g/dL (ref 12.0–15.0)
Potassium: 3.4 meq/L — ABNORMAL LOW (ref 3.5–5.1)
Sodium: 138 meq/L (ref 135–145)
TCO2: 24 mmol/L (ref 0–100)

## 2011-01-24 LAB — DIFFERENTIAL
Band Neutrophils: 0 % (ref 0–10)
Basophils Absolute: 0 10*3/uL (ref 0.0–0.1)
Basophils Relative: 0 % (ref 0–1)
Blasts: 0 %
Eosinophils Absolute: 0.1 10*3/uL (ref 0.0–0.7)
Eosinophils Relative: 1 % (ref 0–5)
Lymphocytes Relative: 17 % (ref 12–46)
Lymphs Abs: 2.1 10*3/uL (ref 0.7–4.0)
Metamyelocytes Relative: 0 %
Monocytes Absolute: 0.8 10*3/uL (ref 0.1–1.0)
Monocytes Relative: 7 % (ref 3–12)
Myelocytes: 0 %
Neutro Abs: 9.1 10*3/uL — ABNORMAL HIGH (ref 1.7–7.7)
Neutrophils Relative %: 75 % (ref 43–77)
Promyelocytes Absolute: 0 %
nRBC: 0 /100{WBCs}

## 2011-01-24 LAB — COMPREHENSIVE METABOLIC PANEL WITH GFR
ALT: 13 U/L (ref 0–35)
ALT: 15 U/L (ref 0–35)
AST: 16 U/L (ref 0–37)
AST: 16 U/L (ref 0–37)
Albumin: 3.3 g/dL — ABNORMAL LOW (ref 3.5–5.2)
Albumin: 3.4 g/dL — ABNORMAL LOW (ref 3.5–5.2)
Alkaline Phosphatase: 78 U/L (ref 39–117)
Alkaline Phosphatase: 80 U/L (ref 39–117)
BUN: 11 mg/dL (ref 6–23)
BUN: 8 mg/dL (ref 6–23)
CO2: 25 meq/L (ref 19–32)
CO2: 27 meq/L (ref 19–32)
Calcium: 8.6 mg/dL (ref 8.4–10.5)
Calcium: 8.9 mg/dL (ref 8.4–10.5)
Chloride: 105 meq/L (ref 96–112)
Chloride: 105 meq/L (ref 96–112)
Creatinine, Ser: 1.02 mg/dL (ref 0.4–1.2)
Creatinine, Ser: 1.28 mg/dL — ABNORMAL HIGH (ref 0.4–1.2)
GFR calc non Af Amer: 45 mL/min — ABNORMAL LOW
GFR calc non Af Amer: 58 mL/min — ABNORMAL LOW
Glucose, Bld: 95 mg/dL (ref 70–99)
Glucose, Bld: 96 mg/dL (ref 70–99)
Potassium: 3.2 meq/L — ABNORMAL LOW (ref 3.5–5.1)
Potassium: 3.3 meq/L — ABNORMAL LOW (ref 3.5–5.1)
Sodium: 134 meq/L — ABNORMAL LOW (ref 135–145)
Sodium: 136 meq/L (ref 135–145)
Total Bilirubin: 0.2 mg/dL — ABNORMAL LOW (ref 0.3–1.2)
Total Bilirubin: 0.2 mg/dL — ABNORMAL LOW (ref 0.3–1.2)
Total Protein: 6.6 g/dL (ref 6.0–8.3)
Total Protein: 6.9 g/dL (ref 6.0–8.3)

## 2011-01-24 LAB — WET PREP, GENITAL: Clue Cells Wet Prep HPF POC: NONE SEEN

## 2011-01-24 LAB — CBC
HCT: 35.6 % — ABNORMAL LOW (ref 36.0–46.0)
MCHC: 32 g/dL (ref 30.0–36.0)
MCHC: 32.7 g/dL (ref 30.0–36.0)
Platelets: 305 10*3/uL (ref 150–400)
Platelets: 327 10*3/uL (ref 150–400)
RDW: 19 % — ABNORMAL HIGH (ref 11.5–15.5)
RDW: 19.3 % — ABNORMAL HIGH (ref 11.5–15.5)
WBC: 10.5 10*3/uL (ref 4.0–10.5)

## 2011-01-24 LAB — GENTAMICIN LEVEL, RANDOM

## 2011-01-24 LAB — URINE MICROSCOPIC-ADD ON

## 2011-01-24 LAB — GC/CHLAMYDIA PROBE AMP, GENITAL
Chlamydia, DNA Probe: NEGATIVE
GC Probe Amp, Genital: NEGATIVE

## 2011-01-24 LAB — SYPHILIS: RPR W/REFLEX TO RPR TITER AND TREPONEMAL ANTIBODIES, TRADITIONAL SCREENING AND DIAGNOSIS ALGORITHM: RPR Ser Ql: NONREACTIVE

## 2011-01-24 LAB — HIV ANTIBODY (ROUTINE TESTING W REFLEX): HIV: NONREACTIVE

## 2011-01-30 LAB — POCT URINALYSIS DIP (DEVICE)
Bilirubin Urine: NEGATIVE
Glucose, UA: NEGATIVE mg/dL
Nitrite: NEGATIVE
Specific Gravity, Urine: 1.02 (ref 1.005–1.030)

## 2011-03-07 NOTE — H&P (Signed)
NAME:  Hailey Barnes, Hailey Barnes                        ACCOUNT NO.:  [id number]   MEDICAL RECORD NO.:  [id number]                   PATIENT TYPE:  AMB   LOCATION:  [facility]                                  FACILITY:  [facility]   PHYSICIAN:  Sheronette A. Cousins, M.D.         DATE OF BIRTH:  1961/02/08   DATE OF ADMISSION:  06/14/2002  DATE OF DISCHARGE:                                HISTORY & PHYSICAL   CHIEF COMPLAINT:  Persistent simple right ovarian cyst.   HISTORY OF PRESENT ILLNESS:  This is a 50 year old gravida 1, para 1 single  black female last menstrual period of May 20, 2002 who is now admitted for  an operative laparoscopy and right ovarian cystectomy secondary to a  persistent 6.4 cm simple right ovarian cyst.  The patient initially  presented with right-sided pain.  Ultrasound on March 29, 2002 showed a 5.6 x  4.7 x 5.1 cm simple right ovarian cyst, and a 3.5 cm also simple right  ovarian cyst , and a third cyst that was 2.3 cm.  The cyst of the large size  was thought to be slightly decreased over her ultrasound done in March of  2003 where at that time the right simple ovarian cyst measured 6.0 x 6.2 x  5.4 cm and there was a 1.6 cm cyst.  On July 10th, the patient had a  followup of her right ovarian cyst which is still noted to be present and  measuring 6.4 x 4.8 x 5.2 cm.  There was no increased blood flow or color  Doppler flow.  The right ovary was normal.  Due to the persistence of this  cyst, the patient now presents for surgical management.  The patient's  history is notable for a previous cesarean section.  She was treated for  Trichomonas vaginitis most recently.  The patient had no associated nausea,  vomiting, fevers or chills.   ALLERGIES:  No known drug allergies.   MEDICATIONS:  Altace.   PAST MEDICAL HISTORY:  Chronic hypertension.   PAST MEDICAL HISTORY:  Cesarean section, cryosurgery, D&C hysteroscopy,  bunionectomy.   PAST SURGICAL HISTORY:  Cesarean  section x1.   FAMILY HISTORY:  Mother has hypertension.  No ovarian cancer.   SOCIAL HISTORY:  Single, one child, one pack per day smoker.   REVIEW OF SYSTEMS:  Negative except for as noted in History of Present  Illness.   PHYSICAL EXAMINATION:  GENERAL:  Obese, well-developed, well-nourished black  female in no acute distress.  VITAL SIGNS:  Blood pressure 130/78, weight 218 pounds, pulse of 80.  SKIN:  No lesions.  HEENT:  Anicteric/muddy sclerae.  Pink conjunctivae.  Oropharynx negative.  HEART:  Regular rate and rhythm without murmur.  LUNGS:  Clear to auscultation.  BREASTS:  Soft, pedunculated, nontender, no palpable mass.  NODES:  No palpable supraclavicular, axillary or inguinal nodes palpable.  ABDOMEN:  Soft, obese with a vertical scar.  Nontender.  PELVIC:  Vulvae shows no lesions.  Vagina had no discharge.  Cervix was  deviated to the right.  Uterus was retroverted, bulky, nontender.  Adnexa no  palpable mass but limited by the patient's body habitus.  EXTREMITIES:  No edema.  RECTAL:  Deferred.   LABORATORY DATA:  Pap in February of 2003 was normal.   IMPRESSION:  Persistent simple right ovarian cyst, right lower quadrant  pain.   PLAN:  Open operative laparoscopy with right ovarian cystectomy.  Routine  admission labs/preop labs.  Discussed risks including possible removal of  the right ovary, inability to perform a laparoscopy secondary to scar  tissue, infection, bleeding, entry to bladder, bowels, ureter, major blood  vessels, possible but less likely ovarian cancer, the possible need for  exploratory laparotomy.  All questions were answered, antibiotic prophylaxis  if laparotomy planned.                                                Sheronette A. Garwin Brothers, M.D.    SAC/MEDQ  D:  06/13/2002  T:  06/14/2002  Job:  [id number]

## 2011-03-07 NOTE — Consult Note (Signed)
Hailey Barnes, BUECHE NO.:  [id number]   MEDICAL RECORD NO.:  [id number]          PATIENT TYPE:  EMS   LOCATION:  MAJO                         FACILITY:  [facility]   PHYSICIAN:  Cletus Gash T. Pickard II, MDDATE OF BIRTH:  1960-11-20   DATE OF CONSULTATION:  DATE OF DISCHARGE:  05/11/2006                                   CONSULTATION   EMERGENCY ROOM CONSULTATION   CHIEF COMPLAINT:  High blood pressure.   HISTORY OF PRESENT ILLNESS:  The patient is a 50 year old, African-American  female with a history of hypertension, who has been off all meds x1 year and  has been having worsening headache over the last month.  She has also been  having blurred vision over the last month.  Was seen [facility] 07, 2006, with a blood pressure of 196/126.  Was started on  hydrochlorothiazide 25 daily and Catapres patch; however, she could not  afford the Catapres patch and she has only been taking the  hydrochlorothiazide.  Today at work, her employers checked her blood  pressure and said that they would not allow her to return to work with a  blood pressure elevated so she came to the emergency department.  She also  reports vague chest tightness this afternoon that is gone now without any  kind of medical intervention.  This pain has been intermittent and unrelated  to exertion over the last month.  Did describe it as a pressure sensation  most likely related to her elevated blood pressure and elevated afterload.   PAST MEDICAL HISTORY:  1.  Fibroids.  2.  Hypertension.  3.  PID.   PAST SURGICAL HISTORY:  1.  Low transverse C-section.  2.  History of fibroidectomy.   MEDICATIONS:  Hydrochlorothiazide 25 daily.   ALLERGIES:  No known drug allergies.   SOCIAL HISTORY:  The patient is single with one child, she smokes, denies  any alcohol, has occasional marijuana, works as a Electrical engineer, eats a lot  of [employer] and high salt foods.   FAMILY HISTORY:   Father from sepsis.  He had a history of hypertension and  diabetes mellitus.  Mother is living and has hypertension.   PHYSICAL EXAMINATION:  VITAL SIGNS:  Temp is 98.5, pulse is 80, blood  pressure 170/89 to 115, respiratory rate is 20, SpO2 is 97%.  GENERAL:  In no apparent distress, alert and oriented x3.  HEENT:  Atraumatic, normocephalic.  Pupils equal, round, and reactive to  light.  Extraocular movements are intact.  TM's and canals are clear.  There  is no erythema or exudate in the posterior pharynx or oropharynx.  CARDIOVASCULAR:  Regular rate and rhythm.  A 2/6 systolic ejection murmur  heard best at the base of the heart, does not radiate into the neck.  PULMONARY:  Lungs clear to auscultation bilaterally.  No wheezes, crackles,  or rales.  ABDOMEN:  Soft, nontender, nondistended, positive bowel sounds.  EXTREMITIES:  No cyanosis, clubbing, or edema.  NEUROLOGIC:  Cranial nerves II-XII are grossly intact.  Muscle strength 5/5  equal  and symmetric in the upper and lower extremities.  The patient has  normal gait and normal reflexes.   ASSESSMENT AND PLAN:  A 50 year old, African-American female with poorly  controlled hypertension and headache.   PROBLEMS:  1.  Hypertension:  There is no evidence of end organ damage at the present      time.  Her EKG shows normal sinus rhythm with no ST elevation, no Q-      waves, and no flipped T-waves.  Point-of-care markers are negative x1.      Myoglobin is 40.1, CK-MB is 2.6, troponin 0.05.  Head CT is negative for      intracranial hemorrhage.  The patient has a normal neurologic exam.  BMP      shows a creatinine at 0.9.  Urinalysis shows no proteinuria.  We will      continue her hydrochlorothiazide 25 mg p.o. daily and add Lopressor 25      mg p.o. b.i.d.  We will have her follow up later this week at the [facility] for a recheck.  I will schedule this appointment for her      either Thursday or Friday, and she  is going to call in the morning to      find out what time she needs to be there.  I will not decrease her blood      pressure more than 20% acutely as this would actually probably      precipitate more symptoms than resolve.  Her blood pressure is actually      done remarkably from the 190s/120s just four days ago.  2.  Tobacco abuse:  I counseled cessation in the emergency department.  3.  FEN:  I counseled a low sodium diet.  4.  Headache:  This was related to hypertension.  Will give her p.r.n.      Tylenol and advised her to take her blood pressure medicines.  5.  Chest pain:  This likely is secondary to increased afterload.  We will      lower blood pressure gradually over the next three to four weeks.  The      patient is pain free now with an EKG normal and point-of-care markers      negative x1.  Advised her to return to the emergency department      immediately if she develops any pressure sensation in her chest,      shortness of breath, dyspnea on exertion, worsening headache, or      unilateral weakness in her body.   ADDENDUM:  EKG shows normal sinus rhythm with a rate of 74 beats per minute  with normal PR interval, normal QTc interval, and normal QRS interval.  There is no LVH, there is borderline left atrial enlargement, there are no  ST changes, no flipped T-waves, no Q-waves, there is a nonspecific T-wave  flattening in the precordial leads.   Head CT is negative.   Sodium was 134, potassium was 3.1 and repleted with 40 mEq p.o. K-Dur,  chloride was 102, bicarb was 26, BUN was 11, creatinine 0.9, glucose of 95.  White count was 10.5, hemoglobin was 11.1, hematocrit was 35.1, and platelet  count was 321.  Myoglobin was 40.1, CK-MB was 2.6, troponin 0.05.   Urinalysis shows a specific gravity of 1.016, glucose negative, hemoglobin  is negative, bilirubin is negative, ketones negative, total protein is negative, nitrites negative, small leukocyte-esterase.  Microscopic  exam  does show a few Trichomonas.   Will give the patient a prescription for Flagyl to treat that and advised  her not to drink alcohol.      Cletus Gash T. Pedro Earls, MD     WTP/MEDQ  D:  05/11/2006  T:  05/11/2006  Job:  [id number]

## 2011-03-07 NOTE — Op Note (Signed)
NAME:  Hailey Barnes, Hailey Barnes                        ACCOUNT NO.:  [id number]   MEDICAL RECORD NO.:  [id number]                   PATIENT TYPE:  SPE   LOCATION:  DFTL                                 FACILITY:  [facility]   PHYSICIAN:  Sheronette A. Garwin Brothers, M.D.         DATE OF BIRTH:  1961/04/25   DATE OF PROCEDURE:  06/14/2002  DATE OF DISCHARGE:  04/28/2002                                 OPERATIVE REPORT   PREOPERATIVE DIAGNOSES:  1. Persistent simple right ovarian cyst.  2. Right lower quadrant pain.   POSTOPERATIVE DIAGNOSIS:  1. Persistent simple right ovarian cyst.  2. Right lower quadrant pain.  3. Chronic pelvic inflammatory disease.   PROCEDURE:  Open laparoscopy, right ovarian cystectomy and lysis of  adhesions.   SURGEON:  Sheronette A. Garwin Brothers, M.D.   ASSISTANT:  Blair Dolphin. Rosana Hoes, M.D.   ANESTHESIA:  General.   INDICATIONS:  This is a 50 year old gravida 1, para 1 female, last menstrual  period was June 08, 2002, with a persistent 6 cm simple right simple  ovarian cyst and associated right lower quadrant pain who now presents for  surgical management.  The patient's history is notable for previous cesarean  section.  Risks and benefits of the procedure had been explained to the  patient.  Consent was signed.  The patient was transferred to the operating  room.   DESCRIPTION OF PROCEDURE:  Under adequate general anesthesia, the patient  was placed in the dorsal lithotomy position.  Her exam was notable for  vertical skin incision.  Bimanual examination under anesthesia revealed a  retroverted uterus but the adnexal mass could not be appreciated.  The  patient was sterilely prepped and draped in the usual fashion.  An  indwelling Foley catheter was placed.  Bivalve speculum was placed in the  vagina.  A single-tooth tenaculum was placed on the anterior lip of the  cervix.  An acorn cannula was introduced into the cervical os and attached  to the tenaculum for  manipulation of the uterus.  The bivalve speculum was  removed.  Attention was then turned to the abdomen.  An infraumbilical  transverse incision was then made after the site was injected with 0.25%  Marcaine.  The incision was then carried down to the rectus fascia which was  subsequently identified and opened transversely.  A pursestring suture  placed with 0 Vicryl was then done, and the Hasson cannula was introduced  after the peritoneum was opened.  High flow carbon dioxide was then  insufflated, and the diagnostic video laparoscope was introduced through  this port.  The patient was placed in Trendelenburg.  Inspection of the  abdomen and pelvis was then done.  A panoramic view was performed with  subdiaphragmatic adhesions to the liver was noted consistent with a Threasa Heads-  Hugh-Curtis sign.  The appendix was normal.  There were some right upper  quadrant adhesions inferior to the liver that were not  lysed.  A suprapubic  incision was then made and under direct visualization a 5 mm port was  introduced.  Using a probe and manipulation of the tenaculum apparatus, the  inspection was then performed.  It was noted that the anterior cul-de-sac  was without any adhesions.  There were small adhesions to the left adnexa.  In the left adnexa, the tube was consistent with hydrosalpinges with the  ovary on the left not visualized.  There were some adhesions in the  posterior cul-de-sac.  There were adhesions from the posterior fundal area  of the uterus to the omentum.  There was a right hydrosalpinx noted.  The  fimbriated ends of neither tube was seen.  There was a 6 cm cyst that was  dissected into the broad ligament on the right that was noted.  A third port  site was then placed in the left lower quadrant as a 10 mm port and through  that port the adhesions of the bowels and the omentum and uterus and to the  left adnexa was lysed to facilitate pushing bowels upwardly.  The posterior   cul-de-sac had a small amount of adhesions.  No endometriosis seen.  The  decision was then made to aspirate the cyst content which was done and 90 cc  of clear yellow fluid was obtained.  Using the hot scissors, the sac of the  ovary was opened and using blunt and sharp dissection the cyst wall was  gently enucleated.  Bleeding that was noted along the way was cauterized.  The cyst appeared to have had several other baby cysts within it, and these  were subsequently removed along with the Lager cyst.  Using the  infraumbilical port and providing countertraction the cyst wall was elevated  and the base cut with scissors.  The ovarian cyst wall was then removed  through the left lower quadrant port, and the bed from which the cyst had  been removed was then carefully cauterized until good hemostasis was noted.  The abdomen was then copiously irrigated, suctioned of debris.  The carbon  dioxide was deflated professionally, and no additional bleeding was noted.  With that having been done, the decision was made not to remove either tube  and to remove the lower ports under direct visualization, deflate the  abdomen and remove the infraumbilical port under direct visualization.  The  pursestring 0 Vicryl suture that had been placed infraumbilical was then  utilized to close the fascia.  The skin was approximated using 4-0 Vicryl  subcuticular stitches and Dermabond for the suprapubic site.  All  instruments that were in the vagina were removed.  The specimen was right  ovarian cyst wall.  Estimated blood loss was 150-200 cc.  Intraoperative  fluid was two liters crystalloid.  Urine output was 900 cc clear yellow  urine.  Sponge and instrument counts x2 were correct.  Complications were  none.  The patient tolerated the procedure well and was returned to recovery  in stable condition.                                              Sheronette A. Garwin Brothers, M.D.    SAC/MEDQ  D:  06/14/2002  T:   06/15/2002  Job:  [id number]

## 2011-07-17 LAB — GC/CHLAMYDIA PROBE AMP, GENITAL
Chlamydia, DNA Probe: NEGATIVE
GC Probe Amp, Genital: NEGATIVE

## 2011-07-17 LAB — WET PREP, GENITAL

## 2011-07-18 LAB — URINALYSIS, ROUTINE W REFLEX MICROSCOPIC
Glucose, UA: NEGATIVE
Ketones, ur: NEGATIVE
Specific Gravity, Urine: 1.022
pH: 5.5

## 2011-07-18 LAB — RPR: RPR Ser Ql: NONREACTIVE

## 2011-07-18 LAB — URINE MICROSCOPIC-ADD ON

## 2011-07-18 LAB — WET PREP, GENITAL

## 2011-12-08 ENCOUNTER — Encounter (HOSPITAL_COMMUNITY): Payer: Self-pay | Admitting: *Deleted

## 2011-12-08 ENCOUNTER — Emergency Department (INDEPENDENT_AMBULATORY_CARE_PROVIDER_SITE_OTHER)
Admission: EM | Admit: 2011-12-08 | Discharge: 2011-12-08 | Disposition: A | Discharge status: Home / Self Care | Payer: Self-pay | Source: Home / Self Care | Attending: Family Medicine | Admitting: Family Medicine

## 2011-12-08 DIAGNOSIS — J329 Chronic sinusitis, unspecified: Secondary | ICD-10-CM

## 2011-12-08 DIAGNOSIS — J040 Acute laryngitis: Secondary | ICD-10-CM

## 2011-12-08 LAB — POCT RAPID STREP A: Streptococcus, Group A Screen (Direct): NEGATIVE

## 2011-12-08 MED ORDER — AZITHROMYCIN 250 MG PO TABS: 250.0000 mg | ORAL_TABLET | Freq: Every day | ORAL | Status: AC

## 2011-12-08 MED ORDER — BENZONATATE 100 MG PO CAPS: 100.0000 mg | ORAL_CAPSULE | Freq: Three times a day (TID) | ORAL | Status: AC

## 2011-12-08 MED ORDER — PREDNISONE 20 MG PO TABS: ORAL_TABLET | ORAL | Status: AC

## 2011-12-08 MED ORDER — ALBUTEROL SULFATE HFA 108 (90 BASE) MCG/ACT IN AERS: 1.0000 | INHALATION_SPRAY | Freq: Four times a day (QID) | RESPIRATORY_TRACT | Status: DC | PRN

## 2011-12-08 MED ORDER — CETIRIZINE HCL 10 MG PO TABS: 10.0000 mg | ORAL_TABLET | Freq: Every day | ORAL | Status: DC

## 2011-12-08 NOTE — ED Provider Notes (Signed)
History     CSN: [id number]  Arrival date & time 12/08/11  0175   First MD Initiated Contact with Patient 12/08/11 1923      Chief Complaint  Patient presents with  . Sore Throat  . URI    (Consider location/radiation/quality/duration/timing/severity/associated sxs/prior treatment) HPI Comments: 51 y/o smoker female h/o HTN and asthma here c/o sore throat, hoarseness, sinus drainage and sinus pressure. Started with cold like symptoms about 2 weeks ago, general symptoms improved but persistent sinus drainage and congestion, associated with cough with yellow sputum has used albuterol inhaler in the past but ran out. Denies shortness of breath or chest pain. Sore throat is mild but has developed hoarseness in last 3 days. Denies fever, appetite ok, drinking fluids well. Taking tylenol cold and flu.   Past Medical History  Diagnosis Date  . 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   Marland Kitchen Hypertension     History reviewed. No pertinent past surgical history.  Family History  Problem Relation Age of Onset  . Hypertension Mother   . Hypertension Father     History  Substance Use Topics  . Smoking status: Current Everyday Smoker -- 0.3 packs/day    Types: Cigarettes  . Smokeless tobacco: Not on file  . Alcohol Use: No    OB History    Grav Para Term Preterm Abortions TAB SAB Ect Mult Living                  Review of Systems  Constitutional: Negative for fever.  HENT: Positive for congestion, sore throat, rhinorrhea, voice change, postnasal drip and sinus pressure. Negative for ear pain, trouble swallowing, neck pain and neck stiffness.   Respiratory: Positive for cough. Negative for shortness of breath.   Gastrointestinal: Negative for nausea, vomiting, abdominal pain and diarrhea.  Musculoskeletal: Negative for myalgias and arthralgias.  Skin: Negative for rash.    Allergies  Review of patient's allergies indicates no known allergies.  Home  Medications   Current Outpatient Rx  Name Route Sig Dispense Refill  . AMLODIPINE BESYLATE 10 MG PO TABS Oral Take 1 tablet (10 mg total) by mouth daily. 30 tablet 6  . LISINOPRIL-HYDROCHLOROTHIAZIDE 20-25 MG PO TABS Oral Take 1 tablet by mouth 2 (two) times daily. For your blood pressure and leg swelling. 30 tablet 6  . ALBUTEROL SULFATE HFA 108 (90 BASE) MCG/ACT IN AERS Inhalation Inhale 1-2 puffs into the lungs every 6 (six) hours as needed for wheezing. 1 Inhaler 0  . AZITHROMYCIN 250 MG PO TABS Oral Take 1 tablet (250 mg total) by mouth daily. Take first 2 tablets together, then 1 every day until finished. 6 tablet 0  . BENZONATATE 100 MG PO CAPS Oral Take 1 capsule (100 mg total) by mouth every 8 (eight) hours. As needed for cough 15 capsule 0  . BUPROPION HCL 100 MG PO TABS Oral Take 1 tablet (100 mg total) by mouth 3 (three) times daily. 90 tablet 6  . CETIRIZINE HCL 10 MG PO TABS Oral Take 1 tablet (10 mg total) by mouth daily. 30 tablet 0  . PREDNISONE 20 MG PO TABS  2 tabs po daily for 5 days 10 tablet no    BP 138/98  Pulse 87  Temp(Src) 98.3 F (36.8 C) (Oral)  Resp 20  SpO2 98%  LMP 11/24/2011  Physical Exam  Nursing note and vitals reviewed. Constitutional: She is oriented to person, place, and time. She  appears well-developed and well-nourished. No distress.  HENT:       Nasal Congestion with erythema and swelling of nasal turbinates, clear rhinorrhea. mild pharyngeal erythema no exudates. No uvula deviation. No trismus. TM's normal.  Neck: Neck supple. No JVD present. No thyromegaly present.  Cardiovascular: Normal heart sounds.   Pulmonary/Chest: Effort normal and breath sounds normal. No respiratory distress. She has no wheezes. She has no rales. She exhibits no tenderness.  Lymphadenopathy:    She has no cervical adenopathy.  Neurological: She is alert and oriented to person, place, and time.  Skin: No rash noted.    ED Course  Procedures (including critical  care time)   Labs Reviewed  POCT RAPID STREP A ([place])  LAB REPORT - SCANNED   No results found.   1. Sinusitis   2. Laryngitis       MDM  Negative strep test. Treated with azithromycin, prednisone, tessalon perls, zyrtec, albuterol refilled. Supportive measures discussed.        Randa Spike, MD 12/09/11 1143

## 2011-12-08 NOTE — ED Notes (Signed)
C/O cold sxs "that turned into drainage, that turned into a raw throat w/ hoarse voice"; sx onset 2-3 wks ago.  Denies any fevers.  Has been doing salt water gargles and taking Tyl Cold & Flu, but continues w/ hoarse voice.

## 2011-12-08 NOTE — Discharge Instructions (Signed)
Follow instructions below. Take the prescribed medications as instructed. Her blood pressure is elevated today, restart taking your blood pressure medications. The need to take her blood pressure medications every day. Return if worsening symptoms despite following treatment. Otherwise follow up at her primary care provider to recheck her blood pressure in one or 2 weeks.  Laryngitis At the top of your windpipe is your voice box. It is the source of your voice. Inside your voice box are 2 bands of muscles called vocal cords. When you breathe, your vocal cords are relaxed and open so that air can get into the lungs. When you decide to say something, these cords come together and vibrate. The sound from these vibrations goes into your throat and comes out through your mouth as sound. Laryngitis is an inflammation of the vocal cords that causes hoarseness, cough, loss of voice, sore throat, and dry throat. Laryngitis can often be related to a viral infection, excessive smoking, excessive talking or yelling, inhalation of toxic fumes, allergies, and reflux of acid from your stomach. CAUSES Laryngitis can be temporary (acute) or long-term (chronic). Most cases of acute laryngitis improve with time. The typical cause of acute laryngitis is viral infection, vocal strain, measles or mumps, or bacterial infections. Chronic laryngitis lasts for more than 3 weeks. It is usually caused by vocal cord strain, vocal cord injury, postnasal drip, growths on the vocal cords, or acid reflux. RISK FACTORS  Respiratory infections.   Exposure to irritating substances, such as cigarette smoke, excessive amounts of alcohol, stomach acids, and workplace chemicals.   Voice trauma, such as vocal cord injury from shouting or speaking too loud.  DIAGNOSIS  The most common sign of laryngitis is hoarseness. This can range from partial to total loss of your voice. Laryngoscopy may be necessary. This allows your caregiver to  look into the larynx in order to diagnose your condition. HOME CARE INSTRUCTIONS  Drink enough fluids to keep your urine clear or pale yellow.   Rest until you no longer have symptoms or as directed by your caregiver.   Breathe in moist air.   Take all medicine as directed by your caregiver.   Do not smoke.   Talk as little as possible (this includes whispering).   Write on paper instead of talking until your voice is back to normal.   Follow up with your caregiver if your condition has not improved after 10 days.  SEEK MEDICAL CARE IF:   You have trouble breathing.   You cough up blood.   You have persistent fever.   You have increasing pain.   You have difficulty swallowing.  Document Released: 10/06/2005 Document Revised: 06/18/2011 Document Reviewed: 12/12/2010 [facility] Patient Information 2012 [place].  Sinusitis Sinuses are air pockets within the bones of your face. The growth of bacteria within a sinus leads to infection. The infection prevents the sinuses from draining. This infection is called sinusitis. SYMPTOMS  There will be different areas of pain depending on which sinuses have become infected.  The maxillary sinuses often produce pain beneath the eyes.   Frontal sinusitis may cause pain in the middle of the forehead and above the eyes.  Other problems (symptoms) include:  Toothaches.   Colored, pus-like (purulent) drainage from the nose.   Swelling, warmth, and tenderness over the sinus areas may be signs of infection.  TREATMENT  Sinusitis is most often determined by an exam.X-rays may be taken. If x-rays have been taken, make sure you obtain your  results or find out how you are to obtain them. Your caregiver may give you medications (antibiotics). These are medications that will help kill the bacteria causing the infection. You may also be given a medication (decongestant) that helps to reduce sinus swelling.  HOME CARE INSTRUCTIONS   Only  take over-the-counter or prescription medicines for pain, discomfort, or fever as directed by your caregiver.   Drink extra fluids. Fluids help thin the mucus so your sinuses can drain more easily.   Applying either moist heat or ice packs to the sinus areas may help relieve discomfort.   Use saline nasal sprays to help moisten your sinuses. The sprays can be found at your local drugstore.  SEEK IMMEDIATE MEDICAL CARE IF:  You have a fever.   You have increasing pain, severe headaches, or toothache.   You have nausea, vomiting, or drowsiness.   You develop unusual swelling around the face or trouble seeing.  MAKE SURE YOU:   Understand these instructions.   Will watch your condition.   Will get help right away if you are not doing well or get worse.  Document Released: 10/06/2005 Document Revised: 06/18/2011 Document Reviewed: 05/05/2007 [facility] Patient Information 2012 [place].

## 2011-12-15 ENCOUNTER — Other Ambulatory Visit: Payer: Self-pay | Admitting: Family Medicine

## 2011-12-15 DIAGNOSIS — I10 Essential (primary) hypertension: Secondary | ICD-10-CM

## 2011-12-15 MED ORDER — LISINOPRIL-HYDROCHLOROTHIAZIDE 20-25 MG PO TABS: 1.0000 | ORAL_TABLET | Freq: Two times a day (BID) | ORAL | Status: DC

## 2011-12-15 NOTE — Telephone Encounter (Signed)
Needs appointment for more refills. Last seen February 2012.

## 2012-01-28 ENCOUNTER — Other Ambulatory Visit: Payer: Self-pay | Admitting: Family Medicine

## 2012-01-28 DIAGNOSIS — I10 Essential (primary) hypertension: Secondary | ICD-10-CM

## 2012-01-28 MED ORDER — LISINOPRIL-HYDROCHLOROTHIAZIDE 20-25 MG PO TABS: 1.0000 | ORAL_TABLET | Freq: Two times a day (BID) | ORAL | Status: DC

## 2012-02-03 ENCOUNTER — Other Ambulatory Visit: Payer: Self-pay | Admitting: Family Medicine

## 2012-02-03 DIAGNOSIS — I10 Essential (primary) hypertension: Secondary | ICD-10-CM

## 2012-02-03 MED ORDER — AMLODIPINE BESYLATE 10 MG PO TABS: 10.0000 mg | ORAL_TABLET | Freq: Every day | ORAL | Status: DC

## 2012-02-03 NOTE — Telephone Encounter (Signed)
Pt has an appt on 4/26 and is out of her Amlodipine - needs enough to last until then  [place]

## 2012-02-03 NOTE — Telephone Encounter (Signed)
Rx  Sent to pharmacy. Attempted calling patient to let her know but no answer.

## 2012-02-05 ENCOUNTER — Ambulatory Visit: Payer: Self-pay | Admitting: Family Medicine

## 2012-02-13 ENCOUNTER — Ambulatory Visit: Payer: Self-pay | Admitting: Family Medicine

## 2012-02-14 ENCOUNTER — Other Ambulatory Visit: Payer: Self-pay | Admitting: Family Medicine

## 2012-02-24 ENCOUNTER — Ambulatory Visit: Payer: Self-pay | Admitting: Family Medicine

## 2012-02-25 ENCOUNTER — Ambulatory Visit: Payer: Self-pay | Admitting: Family Medicine

## 2012-02-26 ENCOUNTER — Encounter: Payer: Self-pay | Admitting: Family Medicine

## 2012-02-26 ENCOUNTER — Ambulatory Visit (INDEPENDENT_AMBULATORY_CARE_PROVIDER_SITE_OTHER): Payer: No Typology Code available for payment source | Admitting: Family Medicine

## 2012-02-26 ENCOUNTER — Ambulatory Visit
Admission: RE | Admit: 2012-02-26 | Discharge: 2012-02-26 | Disposition: A | Discharge status: Home / Self Care | Payer: No Typology Code available for payment source | Source: Ambulatory Visit | Attending: Family Medicine | Admitting: Family Medicine

## 2012-02-26 VITALS — BP 154/97 | HR 103 | Temp 97.8°F | Ht 63.5 in | Wt 261.0 lb

## 2012-02-26 DIAGNOSIS — R609 Edema, unspecified: Secondary | ICD-10-CM

## 2012-02-26 DIAGNOSIS — I10 Essential (primary) hypertension: Secondary | ICD-10-CM

## 2012-02-26 DIAGNOSIS — M25532 Pain in left wrist: Secondary | ICD-10-CM

## 2012-02-26 MED ORDER — FLUOCINONIDE 0.05 % EX OINT: TOPICAL_OINTMENT | Freq: Two times a day (BID) | CUTANEOUS | Status: DC

## 2012-02-26 MED ORDER — METOPROLOL TARTRATE 25 MG PO TABS: 25.0000 mg | ORAL_TABLET | Freq: Two times a day (BID) | ORAL | Status: DC

## 2012-02-26 NOTE — Patient Instructions (Signed)
It was nice to meet you.  I am changing your blood pressure medications.  Please stop taking the Norvasc (amlodipone) as it may be causing the leg swelling.  I want you to start taking metoprolol, on pill in the morning and one pill at night time.  If you have trouble remembering to take your medications- ask your pharmacist about a pill box.   Please go to [place] to have an x-ray of your wrist to make sure it is not broken.  I will call you when I see the results.

## 2012-02-27 ENCOUNTER — Telehealth: Payer: Self-pay | Admitting: Family Medicine

## 2012-02-27 NOTE — Telephone Encounter (Signed)
Called and spoke with Ms. Carter's daughter, let her know x-ray showed no acute fracture, and if it continues to bother her she can wear a wrist splint, ice the wrist, and take ibuprofen as needed.

## 2012-02-29 NOTE — Assessment & Plan Note (Signed)
Poorly controlled- patient may be having side effect of LE edema from norvasc.  Will d/c norvasc, start metoprolol, continue lisinopril/hctz.

## 2012-02-29 NOTE — Assessment & Plan Note (Signed)
This is likely dependent edema. Concerning since patient now linking it with poor sensation in feet and a fall. May be worsened by Norvasc, will change BP regimen and advised her to wear compression stockings.

## 2012-02-29 NOTE — Assessment & Plan Note (Signed)
Will check X-ray of wrist to rule out fracture.  Advised OTC anti-inflammatories and ice twice daily.

## 2012-02-29 NOTE — Progress Notes (Signed)
  Subjective:    Patient ID: Hailey Barnes, female    DOB: 1960-11-28, 51 y.o.   MRN: [id number]  HPI  Ms Hailey Barnes comes in for back pain, wrist pain, and medication refills.   Wrist pain- She says that she fell about a week ago and caught herself with her left wrist, and since then the wrist has been painful and she has had some swelling.  She has taken some ibuprofen and tylenol which only helps a little.   Back pain- the back pain is also since the fall, but she says she did not land on her back, it is just sore.  She says it is her low back and it is an achey pain.  She denies incontinence or weakness.    Fall- with questioning, the patient admits that her feet get very swollen from time to time, and when that happens she does not have very good sensation in her feet, and also she has tingling.  She says that the HCTZ/ Lisinopril does not help with the swelling.  She does not wear any compression stockings for this pain.   HTN- taking HCTZ/Lisinopril and Norvasc.  She does admit she forgets to take the medications sometimes, but says she took all of them this morning.  She denies chest pain, palpitations.    PMHx- patient has had lower extremity swelling for several years, had ECHO in 2011 that I reviewed today with normal EF.   Social History- Patient is a current 1/3 ppd smoker, she is trying to cut back  Review of Systems Pertinent items in HPI    Objective:   Physical Exam BP 154/97  Pulse 103  Temp(Src) 97.8 F (36.6 C) (Oral)  Ht 5' 3.5" (1.613 m)  Wt 261 lb (118.389 kg)  BMI 45.51 kg/m2  LMP 02/12/2012 General appearance: alert, cooperative and no distress Eyes: PERRL, EOMIT Neck: no adenopathy, no carotid bruit, no JVD, supple, symmetrical, trachea midline and thyroid not enlarged, symmetric, no tenderness/mass/nodules Back: symmetric, no curvature. ROM normal. No CVA tenderness., patient has mild tenderness to palpation in her left lumbosacral area over muscles.   Patient has normal strength and sensation of LE bilaterally.  Lungs: clear to auscultation bilaterally Heart: regular rate and rhythm, S1, S2 normal, no murmur, click, rub or gallop Extremities: Left wrist with mild effusion compared to right, warm to touch but no erythema, bony tenderness over ulnar head.  She has trace edema in LE.  Pulses: 2+ and symmetric Skin: Skin color, texture, turgor normal. No rashes or lesions       Assessment & Plan:

## 2012-03-08 ENCOUNTER — Ambulatory Visit: Payer: Self-pay | Admitting: Family Medicine

## 2012-04-30 ENCOUNTER — Emergency Department (HOSPITAL_BASED_OUTPATIENT_CLINIC_OR_DEPARTMENT_OTHER)
Admission: EM | Admit: 2012-04-30 | Discharge: 2012-04-30 | Disposition: A | Discharge status: Home / Self Care | Payer: Self-pay | Attending: Emergency Medicine | Admitting: Emergency Medicine

## 2012-04-30 ENCOUNTER — Encounter (HOSPITAL_BASED_OUTPATIENT_CLINIC_OR_DEPARTMENT_OTHER): Payer: Self-pay | Admitting: Emergency Medicine

## 2012-04-30 DIAGNOSIS — H00019 Hordeolum externum unspecified eye, unspecified eyelid: Secondary | ICD-10-CM | POA: Insufficient documentation

## 2012-04-30 DIAGNOSIS — Z8249 Family history of ischemic heart disease and other diseases of the circulatory system: Secondary | ICD-10-CM | POA: Insufficient documentation

## 2012-04-30 DIAGNOSIS — Z9114 Patient's other noncompliance with medication regimen: Secondary | ICD-10-CM

## 2012-04-30 DIAGNOSIS — Z9109 Other allergy status, other than to drugs and biological substances: Secondary | ICD-10-CM

## 2012-04-30 DIAGNOSIS — I10 Essential (primary) hypertension: Secondary | ICD-10-CM | POA: Insufficient documentation

## 2012-04-30 DIAGNOSIS — F17200 Nicotine dependence, unspecified, uncomplicated: Secondary | ICD-10-CM | POA: Insufficient documentation

## 2012-04-30 MED ORDER — METOPROLOL TARTRATE 25 MG PO TABS: 25.0000 mg | ORAL_TABLET | Freq: Two times a day (BID) | ORAL | Status: DC

## 2012-04-30 MED ORDER — TOBRAMYCIN 0.3 % OP OINT
TOPICAL_OINTMENT | Freq: Three times a day (TID) | OPHTHALMIC | Status: DC
  Filled 2012-04-30: qty 3.5

## 2012-04-30 MED ORDER — ERYTHROMYCIN 5 MG/GM OP OINT
TOPICAL_OINTMENT | Freq: Three times a day (TID) | OPHTHALMIC | Status: DC
  Administered 2012-04-30: 1 via OPHTHALMIC

## 2012-04-30 MED ORDER — LISINOPRIL-HYDROCHLOROTHIAZIDE 20-25 MG PO TABS: 1.0000 | ORAL_TABLET | Freq: Every day | ORAL | Status: AC

## 2012-04-30 MED ORDER — ERYTHROMYCIN 5 MG/GM OP OINT
TOPICAL_OINTMENT | OPHTHALMIC | Status: AC
  Administered 2012-04-30: 1 via OPHTHALMIC
  Filled 2012-04-30: qty 3.5

## 2012-04-30 NOTE — ED Notes (Signed)
Visual acuity screening completed by Raechel Ache, EMT; patient was wearing glasses for the exam.

## 2012-04-30 NOTE — ED Provider Notes (Signed)
History     CSN: [id number]  Arrival date & time 04/30/12  1854   First MD Initiated Contact with Patient 04/30/12 2005      Chief Complaint  Patient presents with  . Eye Pain    (Consider location/radiation/quality/duration/timing/severity/associated sxs/prior treatment) Patient is a 51 y.o. female presenting with eye pain and hypertension. The history is provided by the patient.  Eye Pain This is a new problem. Pertinent negatives include no abdominal pain, chest pain or nausea. Associated symptoms comments: Symptoms of eye swelling progressive over the last week. She has had excessive tearing, nasal drainage and sneezing c/w her history of allergies. However, there is new nodular swelling in the upper right eye that is significantly painful. She has been applying warm compresses without relief. No visual changes or purulent discharge. .  Hypertension This is a chronic problem. Pertinent negatives include no abdominal pain, chest pain or nausea. Associated symptoms comments: She reports she has not been able to obtain her usual medications due to financial constraints. She knows her blood pressure is elevated, but denies symptoms of chest pain, SOB, peripheral edema, headache..    Past Medical History  Diagnosis Date  . 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   Marland Kitchen Hypertension     Past Surgical History  Procedure Date  . Cesarean section     Family History  Problem Relation Age of Onset  . Hypertension Mother   . Hypertension Father     History  Substance Use Topics  . Smoking status: Current Everyday Smoker -- 0.5 packs/day    Types: Cigarettes  . Smokeless tobacco: Never Used  . Alcohol Use: No    OB History    Grav Para Term Preterm Abortions TAB SAB Ect Mult Living                  Review of Systems  HENT: Positive for rhinorrhea and sneezing.   Eyes: Positive for pain and redness.  Respiratory: Negative for shortness of breath.    Cardiovascular: Negative for chest pain and leg swelling.  Gastrointestinal: Negative for nausea and abdominal pain.    Allergies  Review of patient's allergies indicates no known allergies.  Home Medications  No current outpatient prescriptions on file.  BP 191/104  Pulse 82  Temp 98.9 F (37.2 C) (Oral)  Resp 18  Ht 5' 3"  (1.6 m)  Wt 260 lb (117.935 kg)  BMI 46.06 kg/m2  SpO2 98%  LMP 04/05/2012  Physical Exam  Constitutional: She is oriented to person, place, and time. She appears well-developed and well-nourished. No distress.  HENT:  Right Ear: External ear normal.  Left Ear: External ear normal.       Preauricular right ear lymphadenopathy.   Eyes: Pupils are equal, round, and reactive to light.       Bilateral conjunctival redness without chemosis. Excessive tearing without purulence. Eye lids are mildly swollen with large nodular mass in lateral right upper eye lid c/w stye. No active drainage.   Cardiovascular: Normal rate and regular rhythm.   No murmur heard. Pulmonary/Chest: Effort normal and breath sounds normal. She has no wheezes. She has no rales.  Musculoskeletal: She exhibits no edema.  Neurological: She is alert and oriented to person, place, and time.  Psychiatric: She has a normal mood and affect.    ED Course  Procedures (including critical care time)  Labs Reviewed - No data to display No results found.  No diagnosis found.  1. Stye 2. Allergies 3. Hypertension   MDM  Rx for blood pressure medications are given to patient. Eye antibiotics given and referral to eye doctor. She is not having any cardiovascular symptoms to warrant concern over acute hypertensive episode.         Leotis Shames, PA-C 04/30/12 2128

## 2012-04-30 NOTE — ED Notes (Addendum)
Right eye pain and swelling of eyelid x1 week.  Both eyes reddened and tearing.  Pain and "knots" in front of and behind right ear. Has been out of bp med x1 wk.

## 2012-05-01 NOTE — ED Provider Notes (Signed)
Medical screening examination/treatment/procedure(s) were performed by non-physician practitioner and as supervising physician I was immediately available for consultation/collaboration.   Blair Heys, MD 05/01/12 0130

## 2012-11-03 ENCOUNTER — Other Ambulatory Visit: Payer: Self-pay | Admitting: Family Medicine

## 2013-03-07 ENCOUNTER — Other Ambulatory Visit: Payer: Self-pay | Admitting: Family Medicine

## 2013-06-27 ENCOUNTER — Other Ambulatory Visit: Payer: Self-pay | Admitting: Family Medicine

## 2013-06-28 NOTE — Telephone Encounter (Signed)
Patient has not been seen in over 3 months for this uncontrolled issue. I have refilled her medication, but she should be seen in clinic prior to additional refills.

## 2013-06-28 NOTE — Telephone Encounter (Signed)
Tried calling pt but neither number listed for her worked.  Pt needs an appt to get anymore refills.  Hailey Barnes,CMA

## 2013-11-19 ENCOUNTER — Other Ambulatory Visit: Payer: Self-pay | Admitting: Family Medicine

## 2013-11-22 NOTE — Telephone Encounter (Signed)
Refill for one month. Patient has not been seen in over a year. Will need follow-up prior to additional refills.

## 2013-12-09 ENCOUNTER — Encounter (HOSPITAL_COMMUNITY): Payer: Self-pay | Admitting: Emergency Medicine

## 2013-12-09 ENCOUNTER — Inpatient Hospital Stay (HOSPITAL_COMMUNITY)
Admission: EM | Admit: 2013-12-09 | Discharge: 2013-12-11 | DRG: 291 | Disposition: A | Discharge status: Home / Self Care | Payer: Medicaid Other | Attending: Family Medicine | Admitting: Family Medicine

## 2013-12-09 ENCOUNTER — Emergency Department (HOSPITAL_COMMUNITY): Payer: Medicaid Other

## 2013-12-09 DIAGNOSIS — Z8249 Family history of ischemic heart disease and other diseases of the circulatory system: Secondary | ICD-10-CM

## 2013-12-09 DIAGNOSIS — Z79899 Other long term (current) drug therapy: Secondary | ICD-10-CM

## 2013-12-09 DIAGNOSIS — R05 Cough: Secondary | ICD-10-CM | POA: Diagnosis present

## 2013-12-09 DIAGNOSIS — R059 Cough, unspecified: Secondary | ICD-10-CM

## 2013-12-09 DIAGNOSIS — Z6841 Body Mass Index (BMI) 40.0 and over, adult: Secondary | ICD-10-CM

## 2013-12-09 DIAGNOSIS — I10 Essential (primary) hypertension: Secondary | ICD-10-CM | POA: Diagnosis present

## 2013-12-09 DIAGNOSIS — I5031 Acute diastolic (congestive) heart failure: Secondary | ICD-10-CM | POA: Diagnosis present

## 2013-12-09 DIAGNOSIS — J449 Chronic obstructive pulmonary disease, unspecified: Secondary | ICD-10-CM | POA: Diagnosis present

## 2013-12-09 DIAGNOSIS — F17200 Nicotine dependence, unspecified, uncomplicated: Secondary | ICD-10-CM | POA: Diagnosis present

## 2013-12-09 DIAGNOSIS — I509 Heart failure, unspecified: Secondary | ICD-10-CM | POA: Diagnosis present

## 2013-12-09 DIAGNOSIS — K219 Gastro-esophageal reflux disease without esophagitis: Secondary | ICD-10-CM | POA: Diagnosis present

## 2013-12-09 DIAGNOSIS — I5189 Other ill-defined heart diseases: Secondary | ICD-10-CM | POA: Diagnosis present

## 2013-12-09 DIAGNOSIS — E669 Obesity, unspecified: Secondary | ICD-10-CM | POA: Diagnosis present

## 2013-12-09 DIAGNOSIS — J189 Pneumonia, unspecified organism: Secondary | ICD-10-CM | POA: Diagnosis present

## 2013-12-09 DIAGNOSIS — R609 Edema, unspecified: Secondary | ICD-10-CM | POA: Diagnosis present

## 2013-12-09 DIAGNOSIS — J4489 Other specified chronic obstructive pulmonary disease: Secondary | ICD-10-CM | POA: Diagnosis present

## 2013-12-09 LAB — I-STAT TROPONIN, ED: TROPONIN I, POC: 0.01 ng/mL (ref 0.00–0.08)

## 2013-12-09 LAB — BASIC METABOLIC PANEL
BUN: 13 mg/dL (ref 6–23)
CHLORIDE: 99 meq/L (ref 96–112)
CO2: 25 mEq/L (ref 19–32)
CREATININE: 1.14 mg/dL — AB (ref 0.50–1.10)
Calcium: 8.7 mg/dL (ref 8.4–10.5)
GFR, EST AFRICAN AMERICAN: 64 mL/min — AB (ref >90)
GFR, EST NON AFRICAN AMERICAN: 55 mL/min — AB (ref >90)
Glucose, Bld: 98 mg/dL (ref 70–99)
POTASSIUM: 3.7 meq/L (ref 3.7–5.3)
Sodium: 137 mEq/L (ref 137–147)

## 2013-12-09 LAB — CBC
HCT: 34.6 % — ABNORMAL LOW (ref 36.0–46.0)
Hemoglobin: 10.6 g/dL — ABNORMAL LOW (ref 12.0–15.0)
MCH: 23.1 pg — ABNORMAL LOW (ref 26.0–34.0)
MCHC: 30.6 g/dL (ref 30.0–36.0)
MCV: 75.4 fL — ABNORMAL LOW (ref 78.0–100.0)
PLATELETS: 299 10*3/uL (ref 150–400)
RBC: 4.59 MIL/uL (ref 3.87–5.11)
RDW: 16.4 % — AB (ref 11.5–15.5)
WBC: 11.7 10*3/uL — AB (ref 4.0–10.5)

## 2013-12-09 LAB — PRO B NATRIURETIC PEPTIDE: Pro B Natriuretic peptide (BNP): 238.4 pg/mL — ABNORMAL HIGH (ref 0–125)

## 2013-12-09 LAB — RAPID URINE DRUG SCREEN, HOSP PERFORMED
Amphetamines: NOT DETECTED
BARBITURATES: NOT DETECTED
Benzodiazepines: NOT DETECTED
COCAINE: NOT DETECTED
OPIATES: NOT DETECTED
TETRAHYDROCANNABINOL: NOT DETECTED

## 2013-12-09 MED ORDER — ONDANSETRON HCL 4 MG/2ML IJ SOLN: 4.0000 mg | Freq: Three times a day (TID) | INTRAMUSCULAR | Status: DC | PRN

## 2013-12-09 MED ORDER — ALBUTEROL SULFATE (2.5 MG/3ML) 0.083% IN NEBU
5.0000 mg | INHALATION_SOLUTION | Freq: Once | RESPIRATORY_TRACT | Status: AC
  Administered 2013-12-09: 5 mg via RESPIRATORY_TRACT
  Filled 2013-12-09: qty 6

## 2013-12-09 MED ORDER — ALBUTEROL SULFATE (2.5 MG/3ML) 0.083% IN NEBU
2.5000 mg | INHALATION_SOLUTION | RESPIRATORY_TRACT | Status: DC
  Administered 2013-12-09: 2.5 mg via RESPIRATORY_TRACT
  Filled 2013-12-09: qty 3

## 2013-12-09 MED ORDER — AMLODIPINE BESYLATE 10 MG PO TABS
10.0000 mg | ORAL_TABLET | Freq: Once | ORAL | Status: DC
  Filled 2013-12-09: qty 1

## 2013-12-09 MED ORDER — DEXTROSE 5 % IV SOLN
500.0000 mg | Freq: Once | INTRAVENOUS | Status: AC
  Administered 2013-12-09: 500 mg via INTRAVENOUS

## 2013-12-09 MED ORDER — DEXTROSE 5 % IV SOLN
1.0000 g | Freq: Once | INTRAVENOUS | Status: AC
  Administered 2013-12-09: 1 g via INTRAVENOUS
  Filled 2013-12-09: qty 10

## 2013-12-09 MED ORDER — NITROGLYCERIN 2 % TD OINT
1.0000 [in_us] | TOPICAL_OINTMENT | Freq: Once | TRANSDERMAL | Status: AC
  Administered 2013-12-09: 1 [in_us] via TOPICAL
  Filled 2013-12-09: qty 30

## 2013-12-09 MED ORDER — FUROSEMIDE 10 MG/ML IJ SOLN
40.0000 mg | Freq: Once | INTRAMUSCULAR | Status: AC
  Administered 2013-12-09: 40 mg via INTRAVENOUS
  Filled 2013-12-09: qty 4

## 2013-12-09 NOTE — ED Notes (Signed)
Lab called by this RN, notified of addt labs work ordered.

## 2013-12-09 NOTE — ED Provider Notes (Signed)
Complains of shortness of breath worse with lying supine for the past one to 2 weeks. She admits to noncompliance with her medications. She admits to slight cough. Denies fever. Denies other associated symptoms. On exam mild rest for distress. Speaks in sentences. HEENT exam no facial asymmetry neck positive JVD lungs scant crackles diffusely. Abdomen morbidly obese nontender. Lower extremities with 1+ pretibial pitting edema bilaterally  Orlie Dakin, MD 12/09/13 2312

## 2013-12-09 NOTE — ED Provider Notes (Signed)
CSN: [id number]     Arrival date & time 12/09/13  1745 History   First MD Initiated Contact with Patient 12/09/13 1859     Chief Complaint  Patient presents with  . Shortness of Breath     (Consider location/radiation/quality/duration/timing/severity/associated sxs/prior Treatment) HPI Hailey Barnes is a 53 y.o. female who presents emergency department complaining of cough and shortness of breath. Patient reports he has had a dry nonproductive cough for approximately 2 weeks. She states she has had progressive shortness of breath during that time too. He states shortness of breath is constant, worse with exertion. She states she's unable to lay flat, and states she has had to sleep sitting up. Patient also reports swelling in her legs bilaterally which started yesterday. She states she has had swelling in her legs in the past but she thought it was due to 2 her blood pressure medications and they were switched. Her medications were switched a year ago. Patient has not seen her primary care doctor in a year. She does have history of hypertension and bronchitis, she is a current smoker. She states she's trying to cut down. She denies any fever, chills. She does admit to mild throat discomfort. She states she has been taking cough drops with no relief. She denies any prior heart problems. No history of heart failure kidney problems.   Past Medical History  Diagnosis Date  . 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   Marland Kitchen Hypertension   . Bronchitis    Past Surgical History  Procedure Laterality Date  . Cesarean section     Family History  Problem Relation Age of Onset  . Hypertension Mother   . Hypertension Father    History  Substance Use Topics  . Smoking status: Current Every Day Smoker -- 0.50 packs/day    Types: Cigarettes  . Smokeless tobacco: Never Used  . Alcohol Use: No   OB History   Grav Para Term Preterm Abortions TAB SAB Ect Mult Living            Review of Systems  Constitutional: Negative for fever and chills.  Respiratory: Positive for cough, chest tightness and shortness of breath.   Cardiovascular: Positive for chest pain and leg swelling. Negative for palpitations.  Gastrointestinal: Negative for nausea, vomiting, abdominal pain and diarrhea.  Genitourinary: Negative for dysuria, flank pain and pelvic pain.  Musculoskeletal: Negative for arthralgias, myalgias, neck pain and neck stiffness.  Skin: Negative for rash.  Neurological: Negative for dizziness, weakness and headaches.  All other systems reviewed and are negative.      Allergies  Review of patient's allergies indicates no known allergies.  Home Medications   Current Outpatient Rx  Name  Route  Sig  Dispense  Refill  . amLODipine (NORVASC) 10 MG tablet      TAKE ONE TABLET BY MOUTH DAILY   30 tablet   0   . ibuprofen (ADVIL,MOTRIN) 200 MG tablet   Oral   Take 400 mg by mouth every 6 (six) hours as needed (pain).         . metoprolol tartrate (LOPRESSOR) 25 MG tablet      TAKE ONE TABLET TWICE DAILY   60 tablet   0    BP 188/103  Pulse 83  Temp(Src) 98.4 F (36.9 C) (Oral)  Resp 20  SpO2 98%  LMP 12/02/2013 Physical Exam  Nursing note and vitals reviewed. Constitutional: She appears well-developed and well-nourished.  No distress.  HENT:  Head: Normocephalic.  Eyes: Conjunctivae are normal.  Neck: Neck supple.  Cardiovascular: Normal rate, regular rhythm and normal heart sounds.   Pulmonary/Chest: Effort normal. No respiratory distress. She has no wheezes. She has no rales.  Decreased air movement bilaterally, no wheezing.  Abdominal: Soft. Bowel sounds are normal. She exhibits no distension. There is no tenderness. There is no rebound.  Musculoskeletal: She exhibits edema.   2+ pitting edema to the lower extremities bilaterally.  Neurological: She is alert.  Skin: Skin is warm and dry.  Psychiatric: She has a normal mood  and affect. Her behavior is normal.    ED Course  Procedures (including critical care time) Labs Review Labs Reviewed  CBC - Abnormal; Notable for the following:    WBC 11.7 (*)    Hemoglobin 10.6 (*)    HCT 34.6 (*)    MCV 75.4 (*)    MCH 23.1 (*)    RDW 16.4 (*)    All other components within normal limits  BASIC METABOLIC PANEL - Abnormal; Notable for the following:    Creatinine, Ser 1.14 (*)    GFR calc non Af Amer 55 (*)    GFR calc Af Amer 64 (*)    All other components within normal limits  PRO B NATRIURETIC PEPTIDE - Abnormal; Notable for the following:    Pro B Natriuretic peptide (BNP) 238.4 (*)    All other components within normal limits  URINE RAPID DRUG SCREEN (HOSP PERFORMED)  Randolm Idol, ED   Imaging Review Dg Chest 2 View  12/09/2013   CLINICAL DATA:  Cough.  Congestion .  EXAM: CHEST  2 VIEW  COMPARISON:  DG CHEST 2 VIEW dated 06/03/2010  FINDINGS: Mediastinum and hilar structures are normal. Mild cardiomegaly and pulmonary vascular prominence and interstitial prominence with Kerley B-lines noted. Small pleural effusions cannot be excluded. These findings consistent with congestive heart failure with pulmonary interstitial edema. Pneumonitis cannot be excluded. No pneumothorax. No acute osseus abnormality.  IMPRESSION: Findings consistent with congestive heart failure with pulmonary interstitial edema. Superimposed pneumonitis cannot be excluded. .   Electronically Signed   By: Marcello Moores  Register   On: 12/09/2013 19:41    EKG Interpretation    Date/Time:  Friday December 09 2013 18:28:57 EST Ventricular Rate:  79 PR Interval:  145 QRS Duration: 85 QT Interval:  427 QTC Calculation: 489 R Axis:   31 Text Interpretation:  Sinus rhythm Probable left atrial enlargement Anteroseptal infarct, old No significant change since last tracing Confirmed by JACUBOWITZ  MD, SAM ([id number]) on 12/09/2013 8:24:17 PM            MDM   Final diagnoses:  Interstitial  edema  Cough  Hypertension     Patient with cough and chest pressure as well as lower extremity swelling. She is not compliant with her antihypertensives. She has reported orthopnea. No fever. She is hypertensive in emergency department she has not been taking her Norvasc. Will get chest x-ray, labs, EKG.   Patient's chest x-ray showed congestive heart failure with pulmonary interstitial edema. Superimposed pneumonitis cannot be excluded, patient was treated with Lasix IV, nitro paste, and Rocephin and Zithromax for possible pneumonia given she does report cough. She has also received several breathing treatments for her cough symptoms. She is feeling mildly better. I discussed with patient's primary care Dr., whose family practice of [place], they will admit her for further tests and treatment. Patient's blood pressure did improve with  Lasix and nitro paste.  Filed Vitals:   12/09/13 2110 12/09/13 2130 12/09/13 2253 12/09/13 2341  BP: 159/97 143/86 143/95   Pulse: 88 82 85   Temp:      TempSrc: Oral     Resp:  25 20   SpO2: 97% 94% 96% 98%       Renold Genta, PA-C 12/10/13 0108

## 2013-12-09 NOTE — ED Notes (Signed)
Pt reports shortness of breath with pressure on her chest. Pt is hypertensive on arrival with several readings. Pt reports a history of bronchitis. Pt is speaking in short sentences, however has increased work of breathing. Pt reports bilateral leg and feet swelling. Pt is  A/O x4.

## 2013-12-09 NOTE — ED Notes (Signed)
Report called to 2W at [facility], [place] called for transport

## 2013-12-10 DIAGNOSIS — I369 Nonrheumatic tricuspid valve disorder, unspecified: Secondary | ICD-10-CM

## 2013-12-10 DIAGNOSIS — I5189 Other ill-defined heart diseases: Secondary | ICD-10-CM | POA: Diagnosis present

## 2013-12-10 LAB — LIPID PANEL
Cholesterol: 163 mg/dL (ref 0–200)
HDL: 38 mg/dL — ABNORMAL LOW (ref >39)
LDL Cholesterol: 97 mg/dL (ref 0–99)
TRIGLYCERIDES: 138 mg/dL (ref <150)
Total CHOL/HDL Ratio: 4.3 RATIO
VLDL: 28 mg/dL (ref 0–40)

## 2013-12-10 LAB — TROPONIN I
Troponin I: <0.3 ng/mL (ref <0.30)
Troponin I: <0.3 ng/mL (ref <0.30)

## 2013-12-10 LAB — CBC
HCT: 31.9 % — ABNORMAL LOW (ref 36.0–46.0)
HEMOGLOBIN: 10.1 g/dL — AB (ref 12.0–15.0)
MCH: 23.8 pg — ABNORMAL LOW (ref 26.0–34.0)
MCHC: 31.7 g/dL (ref 30.0–36.0)
MCV: 75.2 fL — ABNORMAL LOW (ref 78.0–100.0)
Platelets: 269 10*3/uL (ref 150–400)
RBC: 4.24 MIL/uL (ref 3.87–5.11)
RDW: 16.6 % — ABNORMAL HIGH (ref 11.5–15.5)
WBC: 12.3 10*3/uL — AB (ref 4.0–10.5)

## 2013-12-10 LAB — INFLUENZA PANEL BY PCR (TYPE A & B)
H1N1 flu by pcr: NOT DETECTED
Influenza A By PCR: NEGATIVE
Influenza B By PCR: NEGATIVE

## 2013-12-10 LAB — BASIC METABOLIC PANEL
BUN: 13 mg/dL (ref 6–23)
CO2: 26 mEq/L (ref 19–32)
Calcium: 8.6 mg/dL (ref 8.4–10.5)
Chloride: 101 mEq/L (ref 96–112)
Creatinine, Ser: 1.08 mg/dL (ref 0.50–1.10)
GFR calc Af Amer: 68 mL/min — ABNORMAL LOW (ref >90)
GFR calc non Af Amer: 59 mL/min — ABNORMAL LOW (ref >90)
GLUCOSE: 98 mg/dL (ref 70–99)
POTASSIUM: 3.4 meq/L — AB (ref 3.7–5.3)
SODIUM: 139 meq/L (ref 137–147)

## 2013-12-10 LAB — TSH: TSH: 2.531 u[IU]/mL (ref 0.350–4.500)

## 2013-12-10 MED ORDER — FUROSEMIDE 10 MG/ML IJ SOLN
40.0000 mg | Freq: Two times a day (BID) | INTRAMUSCULAR | Status: DC
  Administered 2013-12-10: 40 mg via INTRAVENOUS
  Filled 2013-12-10 (×3): qty 4

## 2013-12-10 MED ORDER — IPRATROPIUM-ALBUTEROL 0.5-2.5 (3) MG/3ML IN SOLN: 3.0000 mL | RESPIRATORY_TRACT | Status: DC

## 2013-12-10 MED ORDER — PREDNISONE 50 MG PO TABS
50.0000 mg | ORAL_TABLET | Freq: Every day | ORAL | Status: DC
  Filled 2013-12-10 (×2): qty 1

## 2013-12-10 MED ORDER — NITROGLYCERIN 0.4 MG SL SUBL: 0.4000 mg | SUBLINGUAL_TABLET | SUBLINGUAL | Status: DC | PRN

## 2013-12-10 MED ORDER — METOPROLOL TARTRATE 25 MG PO TABS
25.0000 mg | ORAL_TABLET | Freq: Two times a day (BID) | ORAL | Status: DC
  Administered 2013-12-10 – 2013-12-11 (×4): 25 mg via ORAL
  Filled 2013-12-10 (×5): qty 1

## 2013-12-10 MED ORDER — SODIUM CHLORIDE 0.9 % IJ SOLN: 3.0000 mL | INTRAMUSCULAR | Status: DC | PRN

## 2013-12-10 MED ORDER — ACETAMINOPHEN 650 MG RE SUPP: 650.0000 mg | Freq: Four times a day (QID) | RECTAL | Status: DC | PRN

## 2013-12-10 MED ORDER — IPRATROPIUM-ALBUTEROL 0.5-2.5 (3) MG/3ML IN SOLN
3.0000 mL | RESPIRATORY_TRACT | Status: DC
  Filled 2013-12-10: qty 3

## 2013-12-10 MED ORDER — FUROSEMIDE 40 MG PO TABS
40.0000 mg | ORAL_TABLET | Freq: Once | ORAL | Status: AC
  Administered 2013-12-10: 40 mg via ORAL
  Filled 2013-12-10 (×2): qty 1

## 2013-12-10 MED ORDER — IPRATROPIUM-ALBUTEROL 0.5-2.5 (3) MG/3ML IN SOLN
3.0000 mL | Freq: Two times a day (BID) | RESPIRATORY_TRACT | Status: DC
  Administered 2013-12-10 – 2013-12-11 (×2): 3 mL via RESPIRATORY_TRACT
  Filled 2013-12-10 (×2): qty 3

## 2013-12-10 MED ORDER — AMLODIPINE BESYLATE 10 MG PO TABS
10.0000 mg | ORAL_TABLET | Freq: Every day | ORAL | Status: DC
  Administered 2013-12-10 – 2013-12-11 (×2): 10 mg via ORAL
  Filled 2013-12-10 (×2): qty 1

## 2013-12-10 MED ORDER — SODIUM CHLORIDE 0.9 % IJ SOLN
3.0000 mL | Freq: Two times a day (BID) | INTRAMUSCULAR | Status: DC
  Administered 2013-12-10: 3 mL via INTRAVENOUS

## 2013-12-10 MED ORDER — PREDNISONE 50 MG PO TABS
50.0000 mg | ORAL_TABLET | Freq: Every day | ORAL | Status: DC
  Administered 2013-12-10 – 2013-12-11 (×2): 50 mg via ORAL
  Filled 2013-12-10 (×3): qty 1

## 2013-12-10 MED ORDER — SODIUM CHLORIDE 0.9 % IV SOLN: 250.0000 mL | INTRAVENOUS | Status: DC | PRN

## 2013-12-10 MED ORDER — HYDROCHLOROTHIAZIDE 25 MG PO TABS
25.0000 mg | ORAL_TABLET | Freq: Every day | ORAL | Status: DC
  Administered 2013-12-10 – 2013-12-11 (×2): 25 mg via ORAL
  Filled 2013-12-10 (×2): qty 1

## 2013-12-10 MED ORDER — ACETAMINOPHEN 325 MG PO TABS
650.0000 mg | ORAL_TABLET | Freq: Once | ORAL | Status: AC
  Administered 2013-12-10: 650 mg via ORAL
  Filled 2013-12-10: qty 2

## 2013-12-10 MED ORDER — AZITHROMYCIN 250 MG PO TABS
250.0000 mg | ORAL_TABLET | Freq: Every day | ORAL | Status: DC
  Administered 2013-12-10 – 2013-12-11 (×2): 250 mg via ORAL
  Filled 2013-12-10 (×2): qty 1

## 2013-12-10 MED ORDER — ASPIRIN EC 81 MG PO TBEC
81.0000 mg | DELAYED_RELEASE_TABLET | Freq: Every day | ORAL | Status: DC
  Administered 2013-12-10 – 2013-12-11 (×2): 81 mg via ORAL
  Filled 2013-12-10 (×2): qty 1

## 2013-12-10 MED ORDER — SALINE SPRAY 0.65 % NA SOLN
1.0000 | NASAL | Status: DC | PRN
  Administered 2013-12-10 – 2013-12-11 (×2): 1 via NASAL
  Filled 2013-12-10: qty 44

## 2013-12-10 MED ORDER — GUAIFENESIN ER 600 MG PO TB12
600.0000 mg | ORAL_TABLET | Freq: Two times a day (BID) | ORAL | Status: DC
  Administered 2013-12-10 – 2013-12-11 (×3): 600 mg via ORAL
  Filled 2013-12-10 (×4): qty 1

## 2013-12-10 MED ORDER — POTASSIUM CHLORIDE CRYS ER 20 MEQ PO TBCR
40.0000 meq | EXTENDED_RELEASE_TABLET | Freq: Two times a day (BID) | ORAL | Status: AC
  Administered 2013-12-10 (×2): 40 meq via ORAL
  Filled 2013-12-10 (×2): qty 2

## 2013-12-10 MED ORDER — SALINE SPRAY 0.65 % NA SOLN: 1.0000 | NASAL | Status: DC | PRN

## 2013-12-10 MED ORDER — IPRATROPIUM-ALBUTEROL 0.5-2.5 (3) MG/3ML IN SOLN
3.0000 mL | RESPIRATORY_TRACT | Status: DC
  Administered 2013-12-10: 3 mL via RESPIRATORY_TRACT

## 2013-12-10 MED ORDER — HEPARIN SODIUM (PORCINE) 5000 UNIT/ML IJ SOLN
5000.0000 [IU] | Freq: Three times a day (TID) | INTRAMUSCULAR | Status: DC
  Administered 2013-12-10 – 2013-12-11 (×3): 5000 [IU] via SUBCUTANEOUS
  Filled 2013-12-10 (×7): qty 1

## 2013-12-10 MED ORDER — ACETAMINOPHEN 325 MG PO TABS
650.0000 mg | ORAL_TABLET | Freq: Four times a day (QID) | ORAL | Status: DC | PRN
  Administered 2013-12-10 – 2013-12-11 (×3): 650 mg via ORAL
  Filled 2013-12-10 (×3): qty 2

## 2013-12-10 MED ORDER — NICOTINE 14 MG/24HR TD PT24
14.0000 mg | MEDICATED_PATCH | Freq: Every day | TRANSDERMAL | Status: DC
  Administered 2013-12-10 – 2013-12-11 (×2): 14 mg via TRANSDERMAL
  Filled 2013-12-10 (×2): qty 1

## 2013-12-10 MED ORDER — SODIUM CHLORIDE 0.9 % IJ SOLN
3.0000 mL | Freq: Two times a day (BID) | INTRAMUSCULAR | Status: DC
Start: 2013-12-10 — End: 2013-12-11

## 2013-12-10 MED ORDER — ALBUTEROL SULFATE (2.5 MG/3ML) 0.083% IN NEBU: 2.5000 mg | INHALATION_SOLUTION | RESPIRATORY_TRACT | Status: DC | PRN

## 2013-12-10 MED ORDER — PANTOPRAZOLE SODIUM 40 MG PO TBEC
40.0000 mg | DELAYED_RELEASE_TABLET | Freq: Every day | ORAL | Status: DC
  Administered 2013-12-10 – 2013-12-11 (×2): 40 mg via ORAL
  Filled 2013-12-10 (×2): qty 1

## 2013-12-10 NOTE — Progress Notes (Signed)
Weekend CSW received referral that patient needed information about insurance coverage. CSW met with patient and provided medicaid application. Patient expressed concerns about affording her medications at discharge, paying her hospital bills, and inquired about "the [employer] CSW informed patient that financial counselors could follow up with her to discuss payment plans. Patient's contact number is [phone]. CSW informed weekend Case Manager (CM) that patient needs information on prescription assistance and the [employer]. Patient had no further questions at this time. Please re-consult if further social work needs arise.  Tilden Fossa, MSW, [place] Clinical Social Worker [facility] Emergency Dept. [phone]

## 2013-12-10 NOTE — ED Provider Notes (Signed)
Medical screening examination/treatment/procedure(s) were conducted as a shared visit with non-physician practitioner(s) and myself.  I personally evaluated the patient during the encounter.     Orlie Dakin, MD 12/10/13 1430

## 2013-12-10 NOTE — Progress Notes (Signed)
Echocardiogram 2D Echocardiogram has been performed.  Hailey Barnes 12/10/2013, 11:12 AM

## 2013-12-10 NOTE — H&P (Signed)
Attending Addendum  I examined the patient and discussed the assessment and plan with Dr. Marily Memos. I have reviewed the note and agree.  Briefly, 53 yo F with 30 year smoking history (1/4-/13 PPD), obesity, HTN, history of snoring admitted for SOB, DOE and cough x 2 weeks as well as LE edema x 3 days. Patient admits to DOE when walking up stairs and taking in groceries. She admits to snoring at night, daytime somnolence. She admits to intermittent CP at rest and with exertion.   Today she feels much better than she did yesterday. She is urinating with lasix. She is currently not SOB.   BP 154/83  Pulse 82  Temp(Src) 97.3 F (36.3 C) (Oral)  Resp 20  Ht 5' 4"  (1.626 m)  Wt 264 lb 12.4 oz (120.1 kg)  BMI 45.43 kg/m2  SpO2 96%  LMP 12/02/2013 General appearance: alert, cooperative, no distress and moderately obese Lungs: normal WOB, diffuse coarse BS b/l, no wheezing  Heart: regular rate and rhythm, S1, S2 normal, no murmur, click, rub or gallop Extremities: 2+ LE edema b/l  Reviewed labs and studies:  A/P:  53 yo F with SOB. Multifactorial. Suspect and treating for bronchitis.  Nicotine patch ordered. Patient counseled regarding smoking cessation.   Suspect CHF 2/2 HTN, sleep apnea, possible ischemic. Agree with lasix, continue cardioselective BB, add ARB, losartan 25 mg daily, most likely if EF is reduced. Likely cardiology consult as well to get patient plugged in and for recommendation regarding evaluation of coronary arteries, ? Cath. We discussed low salt diet. Patient will need education regarding weight checks and fluid restriction as well.  Anticipate d/c in 1-2 days pending continued improvement and work up.       Boykin Nearing, [place]

## 2013-12-10 NOTE — Progress Notes (Signed)
Reviewed 2D ECHO. Normal EF. Grade 2 diastolic dysfunction.  Patient stable for d.c with addition of HCTZ 25 mg daily for additional blood pressure control.  No need for lasix at this time.  D/c at discharge.  Continue nicoderm patch  Outpatient f/u with recommendation of outpatient sleep study.   Janalyn Higby C 3:54 PM

## 2013-12-10 NOTE — H&P (Signed)
[facility] Admission History and Physical Service Pager: [phone]  Patient name: Hailey Barnes Medical record number: [id number] Date of birth: 05-12-1961 Age: 53 y.o. Gender: female  Primary Care Provider: Tommi Rumps, MD Consultants: none Code Status: Full (confirmed on admission)  Chief Complaint: Shortness of breath, Chest tightness, Cough, LE Edema  Assessment and Plan: Hailey Barnes is a 53 y.o. female presenting with cough x 2 weeks, with worsening shortness of breath, chest tightness, LE edema x3 days. PMH is significant for obesity, HTN, GERD, tobacco abuse  # Shortness of breath, with interstitial edema suggestive of acute CHF exacerbation Last ECHO 06/2010 (EF 55-60%, mild LVH) with intact systolic function and no mention of diastolic dysfunction. No prior diagnosis of CHF or CAD, not previously prescribed diuretics. Current presentation with worsening SOB, b/l LE edema, orthopnea, seems clinically consistent with acute CHF exac, potentially explained by uncontrolled HTN and possible OSA. Initial work-up significant for CXR (interstitial pulm edema, c/w CHF), otherwise vitals stable (except HTN) and no O2 requirement (>96% on RA), labs mostly unremarkable with ProBNP 238 (very mildly elevated, no prior b/l to compare), POCT-trop (negative), EKG (NSR, no acute changes). Per chart review (wt unchanged 260 lb x 2 years). Differential would include likely component of OHS, unlikely PE (Wells Score 0). - admit to inpatient, telemetry - monitor vitals, titrate O2 PRN - strict I/Os, daily wts - s/p Lasix IV 33m x 1 dose - continue Lasix IV 476mBID - f/u BMET in AM - ordered 2D ECHO for re-eval of heart function, given concern for new CHF, likely will need outpt Cards f/u  # Chest tightness, unlikely ACS - rule out MI No significant history of known CAD. Risk factors include HTN, smoker, age >5>36No family h/o early MI. No DM or CAD equivalents.  Never seen Cardiologist or had prior stress testing done. Unlikely ACS with TIMI score 0, Heart Score 2. Currently no active CP and no chest wall pain, describes persistent chest tightness associated with SOB - initial POCT-Trop (negative), EKG (NSR without acute ST-T wave changes) - cycle troponins q6 x 3 - repeat EKG in AM - start ASA 8166maily - SL Nitro PRN CP - continue beta-blocker - check Lipid Panel / TSH for risk stratification  # HTN, uncontrolled Earlier in ED BP documented at 221/132, has since significantly improved. Patient admits to med non-adherence x years, and poor follow-up with PCP. Has been out of rx for long time. - Current BP improved 140s/90s - Continue home Metoprolol 58m81mD, Amlodipine 10mg42mly  # Viral Pneumonitis vs Atypical Pneumonia Presentation with occasionally productive cough x 2 weeks, subjective recent fever/chills. Denies recent illness or course of abx. Only endorses h/o bronchitis in past, no record of CLD, however given significant smoking history (>30 yrs) suspect pt at risk for COPD. Pt wheezing and w/ SOB consistent w/ mild exacerbation. Reported significant improvement with Albuterol nebulizer in ED. - Currently afebrile, stable WBC 11.7, CXR cannot r/o superimposed pneumonitis without evidence of focal infiltrate - s/p CTX (x1 dose) and Azithro IV 500mg 90muld consider continuing Azithro PO if resp status not improving with diuresis or if febrile - Start Duoneb Q4h - Start prednisone 50 Qday x5 days - start Mucinex  # Tobacco Abuse, current smoker - Smokes 1ppd x 3-4 days for about >30 years - emphasized smoking cessation  # Mildly elevated Creatinine Not consistent with AKI, no prior h/o CKD, however pt is HTN, inc risk  for CKD Baseline Cr 0.9-1.0. On admission, mild Cr elevated to 1.14 - not consistent with dx criteria for AKI - continue to monitor SCr trend with BMET - follow UOP  # GERD - Protonix PO 106m daily  FEN/GI: -  SLIV - heart healthy diet  Prophylaxis: Heparin SQ  Disposition: Admit to FPTS observation status, suspected CHF exacerbation / CP rule out, pending further work-up and clinical improvement, expect patient to be discharged to home within 24-48 hours  History of Present Illness:   Hailey BURGOONis a 53y.o. female presenting with dry cough for past 2 weeks, associated with shortness of breath, occasionally productive with mucus and "dark specks". Progressive worsening with recent onset of shortness of breath and difficulty breathing x 3 days, associated with chest tightness and "heaviness" in past 24 hours making breathing difficult, with SOB endorses worse DOE on short distances (b/l DOE with stairs or moderate distances). Notes pleuritic R-sided CP and back pain on deep breaths. Additionally, reports worsening orthopnea with sleeping straight up, normally at baseline with 2-3 pillow orthopnea. Bilateral LE edema within past 24-48 hours, normally with no edema at baseline. Unknown dry wt, expects low within past 1 year is 240 lbs (note chart review 260 lbs 2 years ago).  [place], Albuterol neb x 2 (reported significant relief), CXR consistent with interstitial pulm edema with concern for superimposed pneumonitis, empirically treated with CTX x 1 dose and Azithro IV 50051m Of note: previously treated with Lasix about 2 years ago for LE edema. No longer takes regularly. However, reports taking 1 Lasix PO pill from sister 2-3 days ago with some relief and good UOP, but did not resolve edema / SOB.  Review Of Systems: Per HPI with the following additions:   Admits fever/chills, dry / sore throat, painful spasm in R-back/shoulder associated with intermittent numbness/tingling in R-arm (1 month duration), recent wt gain.  Denies HA, CP, palpitations, abd pain, nausea / vomiting, diarrhea / constipation, congestion / cold symptoms, muscle aches,  calf tenderness or LE pain.  Recently mother passed away 1 week ago with lung cancer  Otherwise 12 point review of systems was performed and was unremarkable.  Patient Active Problem List   Diagnosis Date Noted  . Interstitial edema 12/09/2013  . Wrist pain, acute, left 02/26/2012  . Joint pain 12/12/2010  . Tingling in extremities 12/12/2010  . LEG EDEMA, BILATERAL 06/12/2010  . Hypertension 05/26/2010  . OBESITY, NOS 12/17/2006  . TOBACCO DEPENDENCE 12/17/2006   Past Medical History: Past Medical History  Diagnosis Date  . 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   . H[place]land Kitchenpertension   . Bronchitis    Past Surgical History: Past Surgical History  Procedure Laterality Date  . Cesarean section     Social History: History  Substance Use Topics  . Smoking status: Current Every Day Smoker -- 0.50 packs/day    Types: Cigarettes  . Smokeless tobacco: Never Used  . Alcohol Use: No   Additional social history: Current smoker 0.5 x 3 days, >30 yr smoking history, interested in quitting  Please also refer to relevant sections of EMR.  Family History: Family History  Problem Relation Age of Onset  . Hypertension Mother   . Hypertension Father    Allergies and Medications: No Known Allergies No current facility-administered medications on file prior to encounter.   Current Outpatient Prescriptions on File Prior  to Encounter  Medication Sig Dispense Refill  . amLODipine (NORVASC) 10 MG tablet TAKE ONE TABLET BY MOUTH DAILY  30 tablet  0  . metoprolol tartrate (LOPRESSOR) 25 MG tablet TAKE ONE TABLET TWICE DAILY  60 tablet  0    Objective: BP 160/90  Pulse 84  Temp(Src) 98.3 F (36.8 C) (Oral)  Resp 20  Ht 5' 4"  (1.626 m)  Wt 264 lb 12.4 oz (120.1 kg)  BMI 45.43 kg/m2  SpO2 96%  LMP 12/02/2013 Exam: General: obese, AA female, pleasant and cooperative, appears uncomfortable but NAD HEENT: NCAT, PERRL, EOMI, patent nares w/o congestion,  oropharynx clear, dry MM Neck: supple, non-tender, no LAD, without overt JVD, no carotid bruits Cardiovascular: RRR, no additional heart sounds or murmurs Respiratory: Bilateral decreased air movement, with bibasilar crackles. Wheezing bilat. No coarse rhonchi, or additional focal crackles on exam. No significant inc WOB, no tachypnea. Speaks in full sentences. Abdomen: obese, soft, NTND, +active BS Extremities: b/l LE: +2 pitting edema ankle to mid shin (pre-tibial), non-tender, no erythema, moves all ext, WWP, +2 peripheral pulses radial / dp. Skin: warm, dry, no rashes Neuro: awake, alert, oriented, grossly non-focal with CN-II-XII intact, muscle strength 5/5 all ext, distal sensation to light touch intact. Gait not tested.  Labs and Imaging: CBC BMET   Recent Labs Lab 12/09/13 1905  WBC 11.7*  HGB 10.6*  HCT 34.6*  PLT 299    Recent Labs Lab 12/09/13 1905  NA 137  K 3.7  CL 99  CO2 25  BUN 13  CREATININE 1.14*  GLUCOSE 98  CALCIUM 8.7     POCT-Trop - 0.01 ProBNP - 238.4  UDS - Negative  2/20 CXR 2v IMPRESSION:  Findings consistent with congestive heart failure with pulmonary  interstitial edema. Superimposed pneumonitis cannot be excluded.    Nobie Putnam, DO 12/10/2013, 1:41 AM PGY-1, [place] Intern pager: [phone], text pages welcome   ---------------------------------------------------------------------------------  Upper Level Addenedum  Pt seen and examined and agree w/ above H&P w/ changes noted in [place], MD Family Medicine PGY-3 12/10/2013, 8:36 AM

## 2013-12-11 ENCOUNTER — Other Ambulatory Visit: Payer: Self-pay | Admitting: Family Medicine

## 2013-12-11 MED ORDER — HYDROCHLOROTHIAZIDE 25 MG PO TABS: 25.0000 mg | ORAL_TABLET | Freq: Every day | ORAL | Status: DC

## 2013-12-11 MED ORDER — AMLODIPINE BESYLATE 10 MG PO TABS: 10.0000 mg | ORAL_TABLET | Freq: Every day | ORAL | Status: DC

## 2013-12-11 MED ORDER — BENZONATATE 100 MG PO CAPS: 100.0000 mg | ORAL_CAPSULE | Freq: Three times a day (TID) | ORAL | Status: DC | PRN

## 2013-12-11 MED ORDER — METOPROLOL TARTRATE 25 MG PO TABS: 25.0000 mg | ORAL_TABLET | Freq: Two times a day (BID) | ORAL | Status: DC

## 2013-12-11 MED ORDER — ALBUTEROL SULFATE HFA 108 (90 BASE) MCG/ACT IN AERS: 2.0000 | INHALATION_SPRAY | Freq: Four times a day (QID) | RESPIRATORY_TRACT | Status: DC | PRN

## 2013-12-11 MED ORDER — PREDNISONE 50 MG PO TABS: ORAL_TABLET | ORAL | Status: DC

## 2013-12-11 MED ORDER — ALBUTEROL SULFATE (2.5 MG/3ML) 0.083% IN NEBU: 2.5000 mg | INHALATION_SOLUTION | RESPIRATORY_TRACT | Status: DC | PRN

## 2013-12-11 NOTE — Discharge Summary (Signed)
Attending Addendum  I examined the patient and discussed the discharge plan with Dr. Marily Memos. I have reviewed the note and agree.    Boykin Nearing, [place]

## 2013-12-11 NOTE — Progress Notes (Signed)
   CARE MANAGEMENT NOTE 12/11/2013  Patient:  Hailey Barnes, Hailey Barnes   Account Number:  [id number]  Date Initiated:  12/11/2013  Documentation initiated by:  [facility]  Subjective/Objective Assessment:   adm: Shortness of breath, Chest tightness, Cough, LE Edema     Action/Plan:   discharge planning   Anticipated DC Date:  12/11/2013   Anticipated DC Plan:  [place]  CM consult  [place] Program  PCP issues      Choice offered to / List presented to:             Status of service:  Completed, signed off Medicare Important Message given?   (If response is "NO", the following Medicare IM given date fields will be blank) Date Medicare IM given:   Date Additional Medicare IM given:    Discharge Disposition:  HOME/SELF CARE  Per UR Regulation:    If discussed at Long Length of Stay Meetings, dates discussed:    Comments:  12/11/13 09:00 CM gave pt [place] letter with participating pharmacies.  CM explained the parameters of the [facility] program and pt verbalized understanding.  CM also gave pt handout for the [facility] and [facility] and pt is to call Monday 12/12/13 to get an appointment to apply for an orange card and to secure a PCP.  Pt states she used to have an orange card but never followed through in updating status.  CM also gave pt [employer] [place] to discuss insurance and potential medicaid.  No other CM needs were communicated.  Mariane Masters, BSN, CM [phone].

## 2013-12-11 NOTE — Discharge Instructions (Signed)
You were admitted due to mild diastolic stage 2 heart failure and a likely COPD exacerbation. Both of these issues are improtant to follow up on after you leave the hospital Please call the cardiologist to set up an appointmet Please call our clinic tomorrow to follow up with your regular doctor sometime in the next 1-2 weeks Remember it is up to you to stop smoking Please continue the steroids for the COPD for a few more days Please continue the HCTZ for your blood pressure as well as all of your other medications.

## 2013-12-11 NOTE — Discharge Summary (Signed)
[facility] Discharge Summary  Patient name: Hailey Barnes Medical record number: [id number] Date of birth: [birth year] Age: 53 y.o. Gender: female Date of Admission: 12/09/2013  Date of Discharge: 12/10/13 Admitting Physician: Minerva Ends, MD  Primary Care Provider: Tommi Rumps, MD Consultants: none  Indication for Hospitalization: CP, SOB, fluid overload  Discharge Diagnoses/Problem List:  dCHF Grade 2 COPD +/- ? HTN GERD Pneumonitis   Disposition: home  Discharge Condition: Stable/improved  Discharge Exam:  BP 163/85  Pulse 77  Temp(Src) 97.9 F (36.6 C) (Oral)  Resp 18  Ht 5' 4"  (1.626 m)  Wt 257 lb 4.4 oz (116.7 kg)  BMI 44.14 kg/m2  SpO2 97%  LMP 12/02/2013  General: obese, AA female, pleasant and cooperative, NAD HEENT:  PERRL, EOMI, MMM  Neck: supple, non-tender, without overt JVD,   Cardiovascular: RRR, no additional heart sounds or murmurs  Respiratory:CTAB nml WOB  Abdomen: obese, soft, NTND,  Skin: warm, dry, no rashes  Neuro: awake, alert, oriented, grossly non-focal with CN-II-XII intact,   Brief Hospital Course: Hailey Barnes is a 53 y.o. female presenting with cough x 2 weeks, with worsening shortness of breath, chest tightness, LE edema x3 days. PMH is significant for obesity, HTN, GERD, tobacco abuse   # Shortness of breath, with dCHF exacerbation Last ECHO 06/2010 (EF 55-60%, mild LVH) with intact systolic function and no mention of diastolic dysfunction. No prior diagnosis of CHF or CAD, not previously prescribed diuretics. Presentation on admission with worsening SOB, b/l LE edema, orthopnea, seems clinically consistent with acute CHF exac, potentially explained by uncontrolled HTN and possible OSA. Initial work-up significant for CXR (interstitial pulm edema, c/w CHF), otherwise vitals stable (except HTN) and no O2 requirement (>96% on RA), labs mostly unremarkable with ProBNP 238 (very mildly elevated, no  prior b/l to compare), POCT-trop (negative), EKG (NSR, no acute changes). Per chart review (wt unchanged 260 lb x 2 years). Differential would include likely component of OHS, unlikely PE (Wells Score 0). Pt admitted to [facility]. Started on Lasix 40 IV BID w/ excellent diuresis. 2D Echo performed prior to DC showed EF 55-65% and grade II diastolic dysfunction. No diuretics prescribed at time of DC. Pt to f/u w/ Cardiology as outpt.    # Chest tightness, unlikely ACS - rule out MI . No significant history of known CAD. Risk factors include HTN, smoker, age >44. No family h/o early MI. No DM or CAD equivalents. Never seen Cardiologist or had prior stress testing done. Resolved overnight. Troponins Negative adn EKG w/o evidence of ischemia. start ASA 57m daily. check Lipid Panel / TSH checked for risk stratification.   # HTN, uncontrolled on admission: Prior to admission in ED BP documented at 221/132. Patient admits to med non-adherence for years, and poor follow-up with PCP. Continue home Metoprolol 245mBID, Amlodipine 1033maily and started HCTZ w/ excellent control.   # Viral Pneumonitis vs Atypical Pneumonia . Presentation with occasionally productive cough x 2 weeks, subjective recent fever/chills. Denies recent illness or course of abx. Significant smoking history (>30 yrs) suspect pt at risk for COPD. Pt wheezing and w/ SOB consistent w/ mild exacerbation. Reported significant improvement with Albuterol nebulizer in ED. Started on Duoneb Q4 hrs and Predisone 50 w/ significant improvement. No O2 requirement during admission. Pt started on CTX and Azithro in ED which was stopped upon arrival to [facility] pt w/ significan WBC adn CXR not impressive for PNA.   # Tobacco Abuse, current  smoker. Emphasized tobacco cessation during admission   # GERD: continued on PPI - Protonix PO 64m daily   #Prophylaxis: Heparin SQ during admission   Issues for Follow Up:  New Dx of Grade II dCHF PFT after acute  illness due to concern for COPD HTN: started HCTZ   Significant Procedures:  None   Significant Labs and Imaging:   Recent Labs Lab 12/09/13 1905 12/10/13 0407  WBC 11.7* 12.3*  HGB 10.6* 10.1*  HCT 34.6* 31.9*  PLT 299 269    Recent Labs Lab 12/09/13 1905 12/10/13 0407  NA 137 139  K 3.7 3.4*  CL 99 101  CO2 25 26  GLUCOSE 98 98  BUN 13 13  CREATININE 1.14* 1.08  CALCIUM 8.7 8.6   Echo 12/10/13: EF 55-65% and grade II diastolic dysfunction  Dg Chest 2 View  12/09/2013   CLINICAL DATA:  Cough.  Congestion .  EXAM: CHEST  2 VIEW  COMPARISON:  DG CHEST 2 VIEW dated 06/03/2010  FINDINGS: Mediastinum and hilar structures are normal. Mild cardiomegaly and pulmonary vascular prominence and interstitial prominence with Kerley B-lines noted. Small pleural effusions cannot be excluded. These findings consistent with congestive heart failure with pulmonary interstitial edema. Pneumonitis cannot be excluded. No pneumothorax. No acute osseus abnormality.  IMPRESSION: Findings consistent with congestive heart failure with pulmonary interstitial edema. Superimposed pneumonitis cannot be excluded. .   Electronically Signed   By: TWayne  On: 12/09/2013 19:41    Results/Tests Pending at Time of Discharge:  Discharge Medications:    Medication List    ASK your doctor about these medications       amLODipine 10 MG tablet  Commonly known as:  NORVASC  TAKE ONE TABLET BY MOUTH DAILY     ibuprofen 200 MG tablet  Commonly known as:  ADVIL,MOTRIN  Take 400 mg by mouth every 6 (six) hours as needed (pain).     metoprolol tartrate 25 MG tablet  Commonly known as:  LOPRESSOR  TAKE ONE TABLET TWICE DAILY        Discharge Instructions: Please refer to Patient Instructions section of EMR for full details.  Patient was counseled important signs and symptoms that should prompt return to medical care, changes in medications, dietary instructions, activity restrictions, and  follow up appointments.   Follow-Up Appointments:   DWaldemar Dickens MD 12/11/2013, 8:20 AM PGY-3, [place]

## 2013-12-21 ENCOUNTER — Emergency Department (INDEPENDENT_AMBULATORY_CARE_PROVIDER_SITE_OTHER)
Admission: EM | Admit: 2013-12-21 | Discharge: 2013-12-21 | Disposition: A | Discharge status: Home / Self Care | Payer: Self-pay | Source: Home / Self Care | Attending: Family Medicine | Admitting: Family Medicine

## 2013-12-21 ENCOUNTER — Encounter (HOSPITAL_COMMUNITY): Payer: Self-pay | Admitting: Emergency Medicine

## 2013-12-21 DIAGNOSIS — E8770 Fluid overload, unspecified: Secondary | ICD-10-CM

## 2013-12-21 DIAGNOSIS — B370 Candidal stomatitis: Secondary | ICD-10-CM

## 2013-12-21 DIAGNOSIS — E8779 Other fluid overload: Secondary | ICD-10-CM

## 2013-12-21 HISTORY — DX: Chronic obstructive pulmonary disease, unspecified: J44.9

## 2013-12-21 LAB — POCT I-STAT, CHEM 8
BUN: 12 mg/dL (ref 6–23)
CHLORIDE: 97 meq/L (ref 96–112)
CREATININE: 1 mg/dL (ref 0.50–1.10)
Calcium, Ion: 1.2 mmol/L (ref 1.12–1.23)
GLUCOSE: 112 mg/dL — AB (ref 70–99)
HEMATOCRIT: 42 % (ref 36.0–46.0)
Hemoglobin: 14.3 g/dL (ref 12.0–15.0)
POTASSIUM: 3.4 meq/L — AB (ref 3.7–5.3)
Sodium: 139 mEq/L (ref 137–147)
TCO2: 30 mmol/L (ref 0–100)

## 2013-12-21 MED ORDER — FUROSEMIDE 20 MG PO TABS: 20.0000 mg | ORAL_TABLET | Freq: Two times a day (BID) | ORAL | Status: DC

## 2013-12-21 MED ORDER — NYSTATIN 100000 UNIT/ML MT SUSP: 500000.0000 [IU] | Freq: Four times a day (QID) | OROMUCOSAL | Status: DC

## 2013-12-21 NOTE — Discharge Instructions (Signed)
Thank you for coming in today. 1) Mouth: I think you have thrush. Swish and swallow the nystatin liquid 4 times daily for one week.  2) leg swelling and trouble breathing: Take Lasix twice daily. Stop amlodipine. Followup with your primary care Dr. as soon as possible.  Call the [facility] tomorrow morning at 8:30  Call or go to the emergency room if you get worse, have trouble breathing, have chest pains, or palpitations.   Candida Infection, Adult A candida infection (also called yeast, fungus and Monilia infection) is an overgrowth of yeast that can occur anywhere on the body. A yeast infection commonly occurs in warm, moist body areas. Usually, the infection remains localized but can spread to become a systemic infection. A yeast infection may be a sign of a more severe disease such as diabetes, leukemia, or AIDS. A yeast infection can occur in both men and women. In women, Candida vaginitis is a vaginal infection. It is one of the most common causes of vaginitis. Men usually do not have symptoms or know they have an infection until other problems develop. Men may find out they have a yeast infection because their sex partner has a yeast infection. Uncircumcised men are more likely to get a yeast infection than circumcised men. This is because the uncircumcised glans is not exposed to air and does not remain as dry as that of a circumcised glans. Older adults may develop yeast infections around dentures. CAUSES  Women  Antibiotics.  Steroid medication taken for a long time.  Being overweight (obese).  Diabetes.  Poor immune condition.  Certain serious medical conditions.  Immune suppressive medications for organ transplant patients.  Chemotherapy.  Pregnancy.  Menstration.  Stress and fatigue.  Intravenous drug use.  Oral contraceptives.  Wearing tight-fitting clothes in the crotch area.  Catching it from a sex partner who has a yeast  infection.  Spermicide.  Intravenous, urinary, or other catheters. Men  Catching it from a sex partner who has a yeast infection.  Having oral or anal sex with a person who has the infection.  Spermicide.  Diabetes.  Antibiotics.  Poor immune system.  Medications that suppress the immune system.  Intravenous drug use.  Intravenous, urinary, or other catheters. SYMPTOMS  Women  Thick, white vaginal discharge.  Vaginal itching.  Redness and swelling in and around the vagina.  Irritation of the lips of the vagina and perineum.  Blisters on the vaginal lips and perineum.  Painful sexual intercourse.  Low blood sugar (hypoglycemia).  Painful urination.  Bladder infections.  Intestinal problems such as constipation, indigestion, bad breath, bloating, increase in gas, diarrhea, or loose stools. Men  Men may develop intestinal problems such as constipation, indigestion, bad breath, bloating, increase in gas, diarrhea, or loose stools.  Dry, cracked skin on the penis with itching or discomfort.  Jock itch.  Dry, flaky skin.  Athlete's foot.  Hypoglycemia. DIAGNOSIS  Women  A history and an exam are performed.  The discharge may be examined under a microscope.  A culture may be taken of the discharge. Men  A history and an exam are performed.  Any discharge from the penis or areas of cracked skin will be looked at under the microscope and cultured.  Stool samples may be cultured. TREATMENT  Women  Vaginal antifungal suppositories and creams.  Medicated creams to decrease irritation and itching on the outside of the vagina.  Warm compresses to the perineal area to decrease swelling and  discomfort.  Oral antifungal medications.  Medicated vaginal suppositories or cream for repeated or recurrent infections.  Wash and dry the irritation areas before applying the cream.  Eating yogurt with lactobacillus may help with prevention and  treatment.  Sometimes painting the vagina with gentian violet solution may help if creams and suppositories do not work. Men  Antifungal creams and oral antifungal medications.  Sometimes treatment must continue for 30 days after the symptoms go away to prevent recurrence. HOME CARE INSTRUCTIONS  Women  Use cotton underwear and avoid tight-fitting clothing.  Avoid colored, scented toilet paper and deodorant tampons or pads.  Do not douche.  Keep your diabetes under control.  Finish all the prescribed medications.  Keep your skin clean and dry.  Consume milk or yogurt with lactobacillus active culture regularly. If you get frequent yeast infections and think that is what the infection is, there are over-the-counter medications that you can get. If the infection does not show healing in 3 days, talk to your caregiver.  Tell your sex partner you have a yeast infection. Your partner may need treatment also, especially if your infection does not clear up or recurs. Men  Keep your skin clean and dry.  Keep your diabetes under control.  Finish all prescribed medications.  Tell your sex partner that you have a yeast infection so they can be treated if necessary. SEEK MEDICAL CARE IF:   Your symptoms do not clear up or worsen in one week after treatment.  You have an oral temperature above 102 F (38.9 C).  You have trouble swallowing or eating for a prolonged time.  You develop blisters on and around your vagina.  You develop vaginal bleeding and it is not your menstrual period.  You develop abdominal pain.  You develop intestinal problems as mentioned above.  You get weak or lightheaded.  You have painful or increased urination.  You have pain during sexual intercourse. MAKE SURE YOU:   Understand these instructions.  Will watch your condition.  Will get help right away if you are not doing well or get worse. Document Released: 11/13/2004 Document Revised:  12/29/2011 Document Reviewed: 02/25/2010 [facility] Patient Information 2014 [place].  Heart Failure Heart failure is a condition in which the heart has trouble pumping blood. This means your heart does not pump blood efficiently for your body to work well. In some cases of heart failure, fluid may back up into your lungs or you may have swelling (edema) in your lower legs. Heart failure is usually a long-term (chronic) condition. It is important for you to take good care of yourself and follow your caregiver's treatment plan. CAUSES  Some health conditions can cause heart failure. Those health conditions include:  High blood pressure (hypertension) causes the heart muscle to work harder than normal. When pressure in the blood vessels is high, the heart needs to pump (contract) with more force in order to circulate blood throughout the body. High blood pressure eventually causes the heart to become stiff and weak.  Coronary artery disease (CAD) is the buildup of cholesterol and fat (plaque) in the arteries of the heart. The blockage in the arteries deprives the heart muscle of oxygen and blood. This can cause chest pain and may lead to a heart attack. High blood pressure can also contribute to CAD.  Heart attack (myocardial infarction) occurs when 1 or more arteries in the heart become blocked. The loss of oxygen damages the muscle tissue of the heart. When this  happens, part of the heart muscle dies. The injured tissue does not contract as well and weakens the heart's ability to pump blood.  Abnormal heart valves can cause heart failure when the heart valves do not open and close properly. This makes the heart muscle pump harder to keep the blood flowing.  Heart muscle disease (cardiomyopathy or myocarditis) is damage to the heart muscle from a variety of causes. These can include drug or alcohol abuse, infections, or unknown reasons. These can increase the risk of heart failure.  Lung  disease makes the heart work harder because the lungs do not work properly. This can cause a strain on the heart, leading it to fail.  Diabetes increases the risk of heart failure. High blood sugar contributes to high fat (lipid) levels in the blood. Diabetes can also cause slow damage to tiny blood vessels that carry important nutrients to the heart muscle. When the heart does not get enough oxygen and food, it can cause the heart to become weak and stiff. This leads to a heart that does not contract efficiently.  Other conditions can contribute to heart failure. These include abnormal heart rhythms, thyroid problems, and low blood counts (anemia). Certain unhealthy behaviors can increase the risk of heart failure. Those unhealthy behaviors include:  Being overweight.  Smoking or chewing tobacco.  Eating foods high in fat and cholesterol.  Abusing illicit drugs or alcohol.  Lacking physical activity. SYMPTOMS  Heart failure symptoms may vary and can be hard to detect. Symptoms may include:  Shortness of breath with activity, such as climbing stairs.  Persistent cough.  Swelling of the feet, ankles, legs, or abdomen.  Unexplained weight gain.  Difficulty breathing when lying flat (orthopnea).  Waking from sleep because of the need to sit up and get more air.  Rapid heartbeat.  Fatigue and loss of energy.  Feeling lightheaded, dizzy, or close to fainting.  Loss of appetite.  Nausea.  Increased urination during the night (nocturia). DIAGNOSIS  A diagnosis of heart failure is based on your history, symptoms, physical examination, and diagnostic tests. Diagnostic tests for heart failure may include:  Echocardiography.  Electrocardiography.  Chest X-ray.  Blood tests.  Exercise stress test.  Cardiac angiography.  Radionuclide scans. TREATMENT  Treatment is aimed at managing the symptoms of heart failure. Medicines, behavioral changes, or surgical intervention  may be necessary to treat heart failure.  Medicines to help treat heart failure may include:  Angiotensin-converting enzyme (ACE) inhibitors. This type of medicine blocks the effects of a blood protein called angiotensin-converting enzyme. ACE inhibitors relax (dilate) the blood vessels and help lower blood pressure.  Angiotensin receptor blockers. This type of medicine blocks the actions of a blood protein called angiotensin. Angiotensin receptor blockers dilate the blood vessels and help lower blood pressure.  Water pills (diuretics). Diuretics cause the kidneys to remove salt and water from the blood. The extra fluid is removed through urination. This loss of extra fluid lowers the volume of blood the heart pumps.  Beta blockers. These prevent the heart from beating too fast and improve heart muscle strength.  Digitalis. This increases the force of the heartbeat.  Healthy behavior changes include:  Obtaining and maintaining a healthy weight.  Stopping smoking or chewing tobacco.  Eating heart healthy foods.  Limiting or avoiding alcohol.  Stopping illicit drug use.  Physical activity as directed by your caregiver.  Surgical treatment for heart failure may include:  A procedure to open blocked arteries, repair damaged heart  valves, or remove damaged heart muscle tissue.  A pacemaker to improve heart muscle function and control certain abnormal heart rhythms.  An internal cardioverter defibrillator to treat certain serious abnormal heart rhythms.  A left ventricular assist device to assist the pumping ability of the heart. HOME CARE INSTRUCTIONS   Take your medicine as directed by your caregiver. Medicines are important in reducing the workload of your heart, slowing the progression of heart failure, and improving your symptoms.  Do not stop taking your medicine unless directed by your caregiver.  Do not skip any dose of medicine.  Refill your prescriptions before you  run out of medicine. Your medicines are needed every day.  Take over-the-counter medicine only as directed by your caregiver or pharmacist.  Engage in moderate physical activity if directed by your caregiver. Moderate physical activity can benefit some people. The elderly and people with severe heart failure should consult with a caregiver for physical activity recommendations.  Eat heart healthy foods. Food choices should be free of trans fat and low in saturated fat, cholesterol, and salt (sodium). Healthy choices include fresh or frozen fruits and vegetables, fish, lean meats, legumes, fat-free or low-fat dairy products, and whole grain or high fiber foods. Talk to a dietitian to learn more about heart healthy foods.  Limit sodium if directed by your caregiver. Sodium restriction may reduce symptoms of heart failure in some people. Talk to a dietitian to learn more about heart healthy seasonings.  Use healthy cooking methods. Healthy cooking methods include roasting, grilling, broiling, baking, poaching, steaming, or stir-frying. Talk to a dietitian to learn more about healthy cooking methods.  Limit fluids if directed by your caregiver. Fluid restriction may reduce symptoms of heart failure in some people.  Weigh yourself every day. Daily weights are important in the early recognition of excess fluid. You should weigh yourself every morning after you urinate and before you eat breakfast. Wear the same amount of clothing each time you weigh yourself. Record your daily weight. Provide your caregiver with your weight record.  Monitor and record your blood pressure if directed by your caregiver.  Check your pulse if directed by your caregiver.  Lose weight if directed by your caregiver. Weight loss may reduce symptoms of heart failure in some people.  Stop smoking or chewing tobacco. Nicotine makes your heart work harder by causing your blood vessels to constrict. Do not use nicotine gum or  patches before talking to your caregiver.  Schedule and attend follow-up visits as directed by your caregiver. It is important to keep all your appointments.  Limit alcohol intake to no more than 1 drink per day for nonpregnant women and 2 drinks per day for men. Drinking more than that is harmful to your heart. Tell your caregiver if you drink alcohol several times a week. Talk with your caregiver about whether alcohol is safe for you. If your heart has already been damaged by alcohol or you have severe heart failure, drinking alcohol should be stopped completely.  Stop illicit drug use.  Stay up-to-date with immunizations. It is especially important to prevent respiratory infections through current pneumococcal and influenza immunizations.  Manage other health conditions such as hypertension, diabetes, thyroid disease, or abnormal heart rhythms as directed by your caregiver.  Learn to manage stress.  Plan rest periods when fatigued.  Learn strategies to manage high temperatures. If the weather is extremely hot:  Avoid vigorous physical activity.  Use air conditioning or fans or seek a cooler location.  Avoid caffeine and alcohol.  Wear loose-fitting, lightweight, and light-colored clothing.  Learn strategies to manage cold temperatures. If the weather is extremely cold:  Avoid vigorous physical activity.  Layer clothes.  Wear mittens or gloves, a hat, and a scarf when going outside.  Avoid alcohol.  Obtain ongoing education and support as needed.  Participate or seek rehabilitation as needed to maintain or improve independence and quality of life. SEEK MEDICAL CARE IF:   Your weight increases by 03 lb/1.4 kg in 1 day or 05 lb/2.3 kg in a week.  You have increasing shortness of breath that is unusual for you.  You are unable to participate in your usual physical activities.  You tire easily.  You cough more than normal, especially with physical activity.  You have  any or more swelling in areas such as your hands, feet, ankles, or abdomen.  You are unable to sleep because it is hard to breathe.  You feel like your heart is beating fast (palpitations).  You become dizzy or lightheaded upon standing up. SEEK IMMEDIATE MEDICAL CARE IF:   You have difficulty breathing.  There is a change in mental status such as decreased alertness or difficulty with concentration.  You have a pain or discomfort in your chest.  You have an episode of fainting (syncope). MAKE SURE YOU:   Understand these instructions.  Will watch your condition.  Will get help right away if you are not doing well or get worse. Document Released: 10/06/2005 Document Revised: 01/31/2013 Document Reviewed: 10/28/2012 [facility] Patient Information 2014 [place], [place].

## 2013-12-21 NOTE — ED Notes (Signed)
Bilateral ankle edema, increased difficulty lying down to sleep due to difficulty breathing.  Patient has pain under right breast.  Patient was hospitalized in February of this year.  Patient also repoprts mouth as very irritated and sore.

## 2013-12-21 NOTE — ED Provider Notes (Signed)
Hailey Barnes is a 52 y.o. female who presents to Urgent Care today for  1) Orthopnea and legs swelling. Patient was recently discharged from the hospital with grade 2 diastolic dysfunction with fluid retention. She was treated with Lasix while in the hospital and discharged with amlodipine, metoprolol, and hydrochlorothiazide. She notes last night she had to sit propped up because of shortness of breath. She denies any chest pains or palpitations. She denies any current shortness of breath. She is an appointment with her new provider he care provider in one week. She did not attempt to call [facility] today.  2) mild swelling and irritation. Patient notes burning and irritation in her mouth. This is been present for about 2 weeks. No fevers chills nausea vomiting or diarrhea.   Past Medical History  Diagnosis Date  . 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   Marland Kitchen Hypertension   . Bronchitis   . COPD (chronic obstructive pulmonary disease)    History  Substance Use Topics  . Smoking status: Current Every Day Smoker -- 0.50 packs/day    Types: Cigarettes  . Smokeless tobacco: Never Used  . Alcohol Use: No   ROS as above Medications: No current facility-administered medications for this encounter.   Current Outpatient Prescriptions  Medication Sig Dispense Refill  . albuterol (PROVENTIL HFA;VENTOLIN HFA) 108 (90 BASE) MCG/ACT inhaler Inhale 2 puffs into the lungs every 6 (six) hours as needed for wheezing or shortness of breath.  1 Inhaler  2  . benzonatate (TESSALON PERLES) 100 MG capsule Take 1 capsule (100 mg total) by mouth 3 (three) times daily as needed for cough.  20 capsule  0  . furosemide (LASIX) 20 MG tablet Take 1 tablet (20 mg total) by mouth 2 (two) times daily.  30 tablet  0  . hydrochlorothiazide (HYDRODIURIL) 25 MG tablet Take 1 tablet (25 mg total) by mouth daily.  30 tablet  0  . ibuprofen (ADVIL,MOTRIN) 200 MG tablet Take 400  mg by mouth every 6 (six) hours as needed (pain).      . metoprolol tartrate (LOPRESSOR) 25 MG tablet Take 1 tablet (25 mg total) by mouth 2 (two) times daily.  30 tablet  0  . nystatin (MYCOSTATIN) 100000 UNIT/ML suspension Take 5 mLs (500,000 Units total) by mouth 4 (four) times daily.  60 mL  0  . predniSONE (DELTASONE) 50 MG tablet Take daily with breakfast  3 tablet  0    Exam:  BP 153/90  Pulse 86  Temp(Src) 97.8 F (36.6 C) (Oral)  Resp 24  SpO2 100%  LMP 12/02/2013 Gen: Well NAD HEENT: EOMI,  MMM, white plaque in mouth and tongue with underlying erythematous base consistent with candidiasis. Neck: No JVD at 45 Lungs: Normal work of breathing. CTABL no basilar crackles Heart: RRR no MRG Abd: NABS, Soft. NT, ND Exts: Brisk capillary refill, warm and well perfused. Bilateral lower extremity with 1+ pitting edema  Twelve-lead EKG shows normal sinus rhythm at 79 beats per minute. No significant ST segment elevation or depression. Unchanged from prior EKG.  Results for orders placed during the hospital encounter of 12/21/13 (from the past 24 hour(s))  POCT I-STAT, CHEM 8     Status: Abnormal   Collection Time    12/21/13  8:37 PM      Result Value Ref Range   Sodium 139  137 - 147 mEq/L   Potassium 3.4 (*) 3.7 -  5.3 mEq/L   Chloride 97  96 - 112 mEq/L   BUN 12  6 - 23 mg/dL   Creatinine, Ser 1.00  0.50 - 1.10 mg/dL   Glucose, Bld 112 (*) 70 - 99 mg/dL   Calcium, Ion 1.20  1.12 - 1.23 mmol/L   TCO2 30  0 - 100 mmol/L   Hemoglobin 14.3  12.0 - 15.0 g/dL   HCT 42.0  36.0 - 46.0 %   No results found.  Assessment and Plan: 53 y.o. female with  1) oral thrush. Likely due to recent antibiotic exposure. Plan to treat with nystatin swish and swallow  2) orthopnea and lower extremity edema: Related to worsening heart failure. Patient is not currently on Lasix. Plan to restart Lasix. Continue hydrochlorothiazide and discontinue amlodipine. Followup with primary care provider as  soon as possible. Patient likely will need ACE inhibitor and possible discontinuation of hydrochlorothiazide as well.   Discussed warning signs or symptoms. Please see discharge instructions. Patient expresses understanding.    Gregor Hams, MD 12/21/13 2046

## 2013-12-22 ENCOUNTER — Ambulatory Visit (INDEPENDENT_AMBULATORY_CARE_PROVIDER_SITE_OTHER): Payer: Self-pay | Admitting: Family Medicine

## 2013-12-22 ENCOUNTER — Encounter: Payer: Self-pay | Admitting: Family Medicine

## 2013-12-22 VITALS — BP 157/98 | HR 94 | Temp 98.1°F | Ht 63.5 in | Wt 263.2 lb

## 2013-12-22 DIAGNOSIS — I509 Heart failure, unspecified: Secondary | ICD-10-CM

## 2013-12-22 MED ORDER — POTASSIUM CHLORIDE ER 10 MEQ PO TBCR: 10.0000 meq | EXTENDED_RELEASE_TABLET | Freq: Every day | ORAL | Status: DC

## 2013-12-22 NOTE — Progress Notes (Signed)
   Subjective:    Patient ID: Hailey Barnes, female    DOB: Nov 27, 1960, 53 y.o.   MRN: [id number]  HPI Patient is a 53 yo F presenting for CHF follow-up. She was admitted to the hospital on 2/20 for edema and diagnosed with dCHF. She was aggressively diuresed with IV lasix with good result. She was not discharged on any diuretic medications. Since discharge she has progressively become more short of breath and had increased LE edema as well as enlarging abdomen. She has not seen a cardiologist. Her weight is steadily increasing.   She was seen in [facility] yesterday and given Lasix 22m BID and instructed to follow up with [place] She has not gotten any relief or diuresis from that dose.    Review of Systems She denies CP, HA, elevated BP, pain in joints. Endorses intermittent RUQ abd pain worse with deep inspiration.  History reviewed: Daily smoker but greatly decreased amount smoked    Objective:   Physical Exam VS:  BP 157/98, Pulse 94, R 20, O2 98% General appearance:  Alert, oriented, cooperative with no apparent distress. HEENT:  Normocephalic; PERRLA, conjunctiva and corneas clear;  TM pearly-gray an intact; Oral thrush on tongue; no swelling in neck, lymph nodes soft, without pain or tenderness, no JVD, no carotid bruit, trachea midline. Skin: clean, dry and intact. Chest/Lungs: Lung sounds clear to auscultation and equal bilaterally, with no adventitious sounds noted.  SOB when lying down. No crackles Heart: S1, S2, no murmurs, clicks rubs or gallops noted.   GI:  Abdomen distended. CV: 2+ swelling in all extremities, PPP.     Assessment & Plan:  See Problem based assessment and plan and AVS for additional information

## 2013-12-22 NOTE — Patient Instructions (Signed)
I am sorry you are not feeling well today.  Please take: 41m Lasix tonight, and 810mLasix in the morning. If you do not feel better by tomorrow afternoon, please come back.  If you are having good result, continue taking 4032mriday night, Saturday morning, Saturday night, Sunday morning, Sunday night, and Monday morning.  Come back to clinic on Monday. We will check labs then.  I have sent potassium pills to the pharmacy.   If at any time, you get worse, have chest pain, worsening shortness of breath or have any concerns, please call us [place] go to the ED.  Hailey Barnes M. Corben Auzenne, M.D.

## 2013-12-23 NOTE — Assessment & Plan Note (Addendum)
A: Patient appears to be in mild fluid overload likely due to [facility]. DDx includes kidney etiology, liver etiology, worsening COPD.  P: - Increase Lasix to 44m tonight and in the morning, then 464mBID until recheck on Monday (3/9) - Kdur 1046mdaily for known hypokalemia which will likely worsen with diuresis - At follow, will need Cmet to check K+, Creat and AST/ALT (Liver fxn never checked and complaining of vague RUQ pain) - Encouraged to stop smoking - If not improved, RTC sooner - If worsen, go to ED - Con't all other medications (Except Norvasc and HCTZ which were previously discontinued.)

## 2013-12-26 ENCOUNTER — Other Ambulatory Visit: Payer: Self-pay

## 2013-12-28 ENCOUNTER — Ambulatory Visit: Payer: Self-pay | Admitting: Family Medicine

## 2013-12-30 ENCOUNTER — Ambulatory Visit (INDEPENDENT_AMBULATORY_CARE_PROVIDER_SITE_OTHER): Payer: Self-pay | Admitting: Family Medicine

## 2013-12-30 ENCOUNTER — Telehealth: Payer: Self-pay | Admitting: Family Medicine

## 2013-12-30 ENCOUNTER — Encounter: Payer: Self-pay | Admitting: Family Medicine

## 2013-12-30 VITALS — BP 168/106 | HR 69 | Temp 97.7°F | Ht 63.0 in | Wt 258.3 lb

## 2013-12-30 DIAGNOSIS — R1011 Right upper quadrant pain: Secondary | ICD-10-CM | POA: Insufficient documentation

## 2013-12-30 DIAGNOSIS — I519 Heart disease, unspecified: Secondary | ICD-10-CM

## 2013-12-30 DIAGNOSIS — R609 Edema, unspecified: Secondary | ICD-10-CM

## 2013-12-30 DIAGNOSIS — I509 Heart failure, unspecified: Secondary | ICD-10-CM

## 2013-12-30 DIAGNOSIS — I5189 Other ill-defined heart diseases: Secondary | ICD-10-CM

## 2013-12-30 DIAGNOSIS — I10 Essential (primary) hypertension: Secondary | ICD-10-CM

## 2013-12-30 LAB — COMPREHENSIVE METABOLIC PANEL
ALBUMIN: 3.8 g/dL (ref 3.5–5.2)
ALT: 18 U/L (ref 0–35)
AST: 17 U/L (ref 0–37)
Alkaline Phosphatase: 90 U/L (ref 39–117)
BUN: 13 mg/dL (ref 6–23)
CALCIUM: 9.2 mg/dL (ref 8.4–10.5)
CHLORIDE: 102 meq/L (ref 96–112)
CO2: 31 mEq/L (ref 19–32)
Creat: 1.07 mg/dL (ref 0.50–1.10)
Glucose, Bld: 87 mg/dL (ref 70–99)
POTASSIUM: 3.8 meq/L (ref 3.5–5.3)
Sodium: 139 mEq/L (ref 135–145)
Total Bilirubin: 0.4 mg/dL (ref 0.2–1.2)
Total Protein: 6.9 g/dL (ref 6.0–8.3)

## 2013-12-30 MED ORDER — FUROSEMIDE 20 MG PO TABS: 40.0000 mg | ORAL_TABLET | Freq: Every day | ORAL | Status: DC

## 2013-12-30 MED ORDER — SPIRONOLACTONE 25 MG PO TABS: 25.0000 mg | ORAL_TABLET | Freq: Every day | ORAL | Status: DC

## 2013-12-30 NOTE — Assessment & Plan Note (Signed)
A: 3 weeks of RUQ pain. Exam is reassuring with negative Murphy sign, no jaundice to suggest hepatitis. Pain is reproducible suggesting MSK pain. P: CMP to check LFTs Tylenol 500-100 mg TID prn

## 2013-12-30 NOTE — Telephone Encounter (Signed)
Please call patient.  Stopped potassium.

## 2013-12-30 NOTE — Telephone Encounter (Signed)
Pt was just in the office. Dr Adrian Blackwater told her to stop taking a medication but she cant remember which one. Please advise

## 2013-12-30 NOTE — Assessment & Plan Note (Signed)
A: BP elevated off HCTZ and Norvasc.  Meds: lasix 40 daiul, lopressor 25 mg BID P: BMP Start spironolactone 25 mg daily Smoking cessation.  F/u in two weeks with PCP

## 2013-12-30 NOTE — Patient Instructions (Addendum)
[name],  Thank you for coming in today. Please continue lasix 40 mg daily. Start spironolactone 25 mg daily.  Continue tylenol as needed for right upper abdomen pain, I suspect musculoskeletal pain based on today's exam.   Checking CMP today to check on liver, kidneys and potassium. I have placed a referral to cardiology.  F/u in 7-10 days for repeat BMP  F/u with Dr. Caryl Bis in 2-3 weeks. To recheck blood pressure, check on edema and discuss smoking cessation.   Dr. Adrian Blackwater   Smoking cessation support: smoking cessation hotline: 1-800-QUIT-NOW.  Here is the number to the smoking cessation classes at [facility]: [phone]

## 2013-12-30 NOTE — Telephone Encounter (Signed)
Pt is aware of this. Avea Mcgowen,CMA  

## 2013-12-30 NOTE — Progress Notes (Signed)
   Subjective:    Patient ID: Hailey Barnes, female    DOB: [birth year], 53 y.o.   MRN: [id number]  HPI 53 yo F presents for f/u visit to discuss the following:  1. LE edema: improved. Persistent. She is taking lasix 40 mg daily. Denies CP and SOB.   2. RUQ pain: since hospitalization 12/09/13. No CP or SOB, rash. Pain is intermittent. Sharp. Worse with movement and pressing on the area. Better with rest and tylenol 1-2 tabs daily.   3. HTN: complaint with lasix and BB. Stopped HCTZ per instructions. Denies HA, vision changes, CP, SOB. Smokes 3/4 PPD.   Review of Systems As per HPI     Objective:   Physical Exam BP 168/106  Pulse 69  Temp(Src) 97.7 F (36.5 C) (Oral)  Ht 5' 3"  (1.6 m)  Wt 258 lb 4.8 oz (117.164 kg)  BMI 45.77 kg/m2  LMP 12/27/2013 Wt Readings from Last 3 Encounters:  12/30/13 258 lb 4.8 oz (117.164 kg)  12/22/13 263 lb 3.2 oz (119.387 kg)  12/11/13 257 lb 4.4 oz (116.7 kg)  General appearance: alert, cooperative and no distress Eyes: no scleral icterus  Neck: no adenopathy, no carotid bruit, no JVD, supple, symmetrical, trachea midline and thyroid not enlarged, symmetric, no tenderness/mass/nodules Lungs: clear to auscultation bilaterally Heart: regular rate and rhythm, S1, S2 normal, no murmur, click, rub or gallop Abdomen: obese, Soft, no distension, negative Murphy, RUQ pain with deep palpation over inferior ribs  Extremities: edema 1+ LE edema   Reviewed CXR from 12/09/13.     Assessment & Plan:

## 2013-12-30 NOTE — Assessment & Plan Note (Signed)
A: persistent, improved. In the setting of known grade 2 diastolic dysfunction.  P:  Lasix 40 daily  Start aldactone 25 mg daily BMP  Today-replete K if needed.  F/u in one week for repeat BMP F/u with PCP in 2 weeks for BP, BMP and edema f/u

## 2013-12-30 NOTE — Assessment & Plan Note (Addendum)
A: chronic diastolic dysfunction in the setting of HTN and obesity. Chief complaint related to this is leg edema. Leg edema has improved since last visit.  P: Lasix 40 daily  Start aldactone 25 mg daily BMP  Today-replete K if needed.  F/u in one week for repeat BMP F/u with PCP in 2 weeks for BP, BMP and edema f/u  Ultimate goals: weight loss, BP control and smoking cessation

## 2013-12-30 NOTE — Telephone Encounter (Signed)
I looked on visit summary but didn't see which medication was mentioned for pt to stop.  Please advise. Jazmin Hartsell,CMA

## 2014-01-02 ENCOUNTER — Telehealth: Payer: Self-pay | Admitting: Family Medicine

## 2014-01-02 DIAGNOSIS — R1011 Right upper quadrant pain: Secondary | ICD-10-CM

## 2014-01-02 NOTE — Assessment & Plan Note (Signed)
Called patient  Normal CMP She is still having RUQ pain. Worse with eating. Some nausea. No vomting or fever.  A: ? Biliary colic  P: Avoid fatty meals AB/US-ordered  PCP f/u-patient has already scheduled for 01/23/14.

## 2014-01-02 NOTE — Telephone Encounter (Signed)
I am aware  

## 2014-01-02 NOTE — Telephone Encounter (Signed)
Pt has no current insurance on file.  Before I schedule this just want you to be aware.  Thanks [employer]

## 2014-01-02 NOTE — Telephone Encounter (Signed)
Thank you. Please inform patient.

## 2014-01-02 NOTE — Telephone Encounter (Signed)
Appt 01-04-14 @ 0930.  Nothing to eat or drink 6-8 hours before.  She can take her medication but only drink enough water to swallow pills. Jazmin Hartsell,CMA

## 2014-01-02 NOTE — Telephone Encounter (Signed)
Called patient  Normal CMP She is still having RUQ pain. Worse with eating. Some nausea. No vomting or fever.  A: ? Biliary colic  P: Avoid fatty meals AB/US-ordered  PCP f/u-patient has already scheduled for 01/23/14.

## 2014-01-04 ENCOUNTER — Ambulatory Visit (HOSPITAL_COMMUNITY)
Admission: RE | Admit: 2014-01-04 | Discharge: 2014-01-04 | Disposition: A | Discharge status: Home / Self Care | Payer: Medicaid Other | Source: Ambulatory Visit | Attending: Family Medicine | Admitting: Family Medicine

## 2014-01-04 ENCOUNTER — Telehealth: Payer: Self-pay | Admitting: Family Medicine

## 2014-01-04 DIAGNOSIS — R110 Nausea: Secondary | ICD-10-CM | POA: Diagnosis not present

## 2014-01-04 DIAGNOSIS — R1011 Right upper quadrant pain: Secondary | ICD-10-CM | POA: Insufficient documentation

## 2014-01-04 DIAGNOSIS — K7581 Nonalcoholic steatohepatitis (NASH): Secondary | ICD-10-CM

## 2014-01-04 NOTE — Telephone Encounter (Signed)
Called patient. Informed of US findings consistent with NAFLD.  Plan: Weight loss.  Patient has been eating better, less fried food. Does not exercise due to SOB with exertion.  Plan: See Raford Pitcher for financial assistance regarding. Plan to get referral to dietician, referral to cardiology. Will cancel cards referral for now due to lack of insurance.    We recommend weight loss for overweight and obese patients (Grade 1B). In addition to its other health benefits, weight loss, either through lifestyle modifications or bariatric surgery, has been associated with histologic improvement in patients with NAFLD. A reasonable goal for many patients is to lose 0.5 to 1 kg/week (1 to 2 lb/week). Orlistat can be used to aid with weight loss in patients who fail to achieve weight loss goals through diet and exercise alone. (See 'Weight loss' above and 'Other pharmacologic therapies' above.) ?Patients with chronic liver disease, including NAFLD, require vaccinations for hepatitis A and B if they are not already immune, the pneumococcal vaccine, and standard age-appropriate vaccinations (figure 1 and figure 2). (See "Immunizations for patients with chronic liver disease", section on 'Vaccines in chronic liver disease'.)

## 2014-01-10 ENCOUNTER — Ambulatory Visit: Payer: Medicaid Other

## 2014-01-23 ENCOUNTER — Ambulatory Visit (INDEPENDENT_AMBULATORY_CARE_PROVIDER_SITE_OTHER): Payer: No Typology Code available for payment source | Admitting: Family Medicine

## 2014-01-23 ENCOUNTER — Encounter: Payer: Self-pay | Admitting: Family Medicine

## 2014-01-23 VITALS — BP 176/69 | HR 77 | Temp 97.9°F | Resp 77 | Ht 63.0 in | Wt 267.0 lb

## 2014-01-23 DIAGNOSIS — I519 Heart disease, unspecified: Secondary | ICD-10-CM

## 2014-01-23 DIAGNOSIS — M549 Dorsalgia, unspecified: Secondary | ICD-10-CM

## 2014-01-23 DIAGNOSIS — I5189 Other ill-defined heart diseases: Secondary | ICD-10-CM

## 2014-01-23 DIAGNOSIS — I509 Heart failure, unspecified: Secondary | ICD-10-CM

## 2014-01-23 DIAGNOSIS — I10 Essential (primary) hypertension: Secondary | ICD-10-CM

## 2014-01-23 LAB — BASIC METABOLIC PANEL
BUN: 14 mg/dL (ref 6–23)
CO2: 27 mEq/L (ref 19–32)
CREATININE: 1.4 mg/dL — AB (ref 0.50–1.10)
Calcium: 9.1 mg/dL (ref 8.4–10.5)
Chloride: 103 mEq/L (ref 96–112)
Glucose, Bld: 81 mg/dL (ref 70–99)
Potassium: 4.2 mEq/L (ref 3.5–5.3)
Sodium: 137 mEq/L (ref 135–145)

## 2014-01-23 MED ORDER — METOPROLOL TARTRATE 50 MG PO TABS: 50.0000 mg | ORAL_TABLET | Freq: Two times a day (BID) | ORAL | Status: DC

## 2014-01-23 MED ORDER — AMLODIPINE BESYLATE 10 MG PO TABS: 10.0000 mg | ORAL_TABLET | Freq: Every day | ORAL | Status: DC

## 2014-01-23 NOTE — Patient Instructions (Signed)
Nice to see you. For your breathing please increase the amount of lasix you are taking to 80 mg daily for the next week. We will have you come back to clinic later this week or early next week for follow-up. Please watch the amount of salt you are eating in your diet as this can affect how much fluid you are holding on to. For your back pain please try tylenol to help with this. Please also try to do the back exercises provided.  Heart Failure Heart failure means your heart has trouble pumping blood. This makes it hard for your body to work well. Heart failure is usually a long-term (chronic) condition. You must take good care of yourself and follow your doctor's treatment plan. HOME CARE  Take your heart medicine as told by your doctor.  Do not stop taking medicine unless your doctor tells you to.  Do not skip any dose of medicine.  Refill your medicines before they run out.  Take other medicines only as told by your doctor or pharmacist.  Stay active if told by your doctor. The elderly and people with severe heart failure should talk with a doctor about physical activity.  Eat heart healthy foods. Choose foods that are without trans fat and are low in saturated fat, cholesterol, and salt (sodium). This includes fresh or frozen fruits and vegetables, fish, lean meats, fat-free or low-fat dairy foods, whole grains, and high-fiber foods. Lentils and dried peas and beans (legumes) are also good choices.  Limit salt if told by your doctor.  Cook in a healthy way. Roast, grill, broil, bake, poach, steam, or stir-fry foods.  Limit fluids as told by your doctor.  Weigh yourself every morning. Do this after you pee (urinate) and before you eat breakfast. Write down your weight to give to your doctor.  Take your blood pressure and write it down if your doctor tell you to.  Ask your doctor how to check your pulse. Check your pulse as told.  Lose weight if told by your doctor.  Stop  smoking or chewing tobacco. Do not use gum or patches that help you quit without your doctor's approval.  Schedule and go to doctor visits as told.  Nonpregnant women should have no more than 1 drink a day. Men should have no more than 2 drinks a day. Talk to your doctor about drinking alcohol.  Stop illegal drug use.  Stay current with shots (immunizations).  Manage your health conditions as told by your doctor.  Learn to manage your stress.  Rest when you are tired.  If it is really hot outside:  Avoid intense activities.  Use air conditioning or fans, or get in a cooler place.  Avoid caffeine and alcohol.  Wear loose-fitting, lightweight, and light-colored clothing.  If it is really cold outside:  Avoid intense activities.  Layer your clothing.  Wear mittens or gloves, a hat, and a scarf when going outside.  Avoid alcohol.  Learn about heart failure and get support as needed.  Get help to maintain or improve your quality of life and your ability to care for yourself as needed. GET HELP IF:   You gain 03 lb/1.4 kg or more in 1 day or 05 lb/2.3 kg in a week.  You are more short of breath than usual.  You cannot do your normal activities.  You tire easily.  You cough more than normal, especially with activity.  You have any or more puffiness (swelling) in areas  such as your hands, feet, ankles, or belly (abdomen).  You cannot sleep because it is hard to breathe.  You feel like your heart is beating fast (palpitations).  You get dizzy or lightheaded when you stand up. GET HELP RIGHT AWAY IF:   You have trouble breathing.  There is a change in mental status, such as becoming less alert or not being able to focus.  You have chest pain or discomfort.  You faint. MAKE SURE YOU:   Understand these instructions.  Will watch your condition.  Will get help right away if you are not doing well or get worse. Document Released: 07/15/2008 Document  Revised: 01/31/2013 Document Reviewed: 05/06/2012 [facility] Patient Information 2014 [place], [place].

## 2014-01-26 ENCOUNTER — Other Ambulatory Visit: Payer: Self-pay | Admitting: Family Medicine

## 2014-01-26 ENCOUNTER — Telehealth: Payer: Self-pay | Admitting: *Deleted

## 2014-01-26 DIAGNOSIS — I509 Heart failure, unspecified: Secondary | ICD-10-CM

## 2014-01-26 DIAGNOSIS — M549 Dorsalgia, unspecified: Secondary | ICD-10-CM | POA: Insufficient documentation

## 2014-01-26 NOTE — Assessment & Plan Note (Signed)
Patient with 9 lb weight gain and feeling of dyspnea, also with clinical signs of volume overload. Ambulatory sat was 97%. Will check BMET and refer to cards for new CHF. Advised to take lasix 80 mg over the next 7 days. Advised on decreased salt in diet, especially salt containing foods. Given return precautions. To f/u by the end of this week or early next week.

## 2014-01-26 NOTE — Assessment & Plan Note (Signed)
No red flags. Likely MSK in origin given TTP of paraspinous muscles. Discussed weight loss, back exercises, and use of tylenol for pain relief. Return prn.

## 2014-01-26 NOTE — Telephone Encounter (Signed)
LM for patient to call back.  Please give her the message below and schedule a lab appt when she does.  Thanks [employer]

## 2014-01-26 NOTE — Assessment & Plan Note (Signed)
Elevated today. Will increase metoprolol dosing and continue other medications. F/u within one week.

## 2014-01-26 NOTE — Telephone Encounter (Signed)
Message copied by Valerie Roys on Thu Jan 26, 2014  5:07 PM ------      Message from: Caryl Bis, ERIC G      Created: Thu Jan 26, 2014 12:41 PM       Patients renal function increased from previously likely related to increased use of diuretics. Please have the patient return for a lab appointment on 4/10 for a recheck of this. Please advise her of this. Thanks. ------

## 2014-01-26 NOTE — Progress Notes (Signed)
Patient ID: Hailey Barnes, female   DOB: [birth year], 53 y.o.   MRN: [id number]  Tommi Rumps, MD Phone: [phone]  Hailey Barnes is a 53 y.o. female who presents today for f/u.  Back pain: has pain in her mid low back. Occasionally radiates to her thighs and hips. Pain is typically worse in the morning. Occasionally she notes her legs give out on her. No other weakness, numbness, or tingling. No urinary or fecal issues. No saddle anesthesia.  Dyspnea: patient states that her "fluid is up." Breathing is heavy over the last couple of days. Notes she typically takes 40 mg of lasix, though occasionally increases to 80 mg if feels she has too much fluid on her. She notes no current chest pain though does note some pain in her left mid ribs that is sharp and non-radiating. She gets sweaty occasionally with this. Positive orthopnea and PND. She was found to have grade 2 diastolic dysfunction recently. She is on a BB and aldactone.  Patient is a smoker.   ROS: Per HPI   Physical Exam Filed Vitals:   01/23/14 1355  BP: 176/69  Pulse: 77  Temp: 97.9 F (36.6 C)  Resp: 77    Physical Examination: General appearance - alert, well appearing, and in no distress Eyes - pupils equal and reactive, extraocular eye movements intact Mouth - mucous membranes moist, pharynx normal without lesions Chest - minimal bibasilar crackles, few scattered wheezes Heart - normal rate, regular rhythm, normal S1, S2, no murmurs, rubs, clicks or gallops Back exam - no swelling or erythema, mild TTP in bilateral paraspinous muscles, no midline tenderness, negative straight leg raise Neurological - CN 2-12 intact, 5/5 strength in bilateral biceps, triceps, grip, hip flexors, quads, hamstrings, plantar and dorsiflexion, sensation to light touch intact in bilateral upper and lower extremities Extremities - pedal edema 1-2 + Skin - normal coloration and turgor, no rashes, no suspicious skin lesions  noted   Assessment/Plan: Please see individual problem list.

## 2014-01-30 ENCOUNTER — Other Ambulatory Visit: Payer: No Typology Code available for payment source

## 2014-01-30 ENCOUNTER — Ambulatory Visit: Payer: No Typology Code available for payment source | Admitting: Family Medicine

## 2014-02-02 ENCOUNTER — Other Ambulatory Visit: Payer: Self-pay | Admitting: Family Medicine

## 2014-02-04 ENCOUNTER — Other Ambulatory Visit (HOSPITAL_COMMUNITY): Payer: Self-pay | Admitting: Family Medicine

## 2014-02-06 ENCOUNTER — Ambulatory Visit: Payer: No Typology Code available for payment source | Admitting: Family Medicine

## 2014-02-06 ENCOUNTER — Other Ambulatory Visit: Payer: No Typology Code available for payment source

## 2014-02-06 NOTE — Telephone Encounter (Signed)
Refills given for one month.

## 2014-02-19 ENCOUNTER — Inpatient Hospital Stay (EMERGENCY_DEPARTMENT_HOSPITAL)
Admission: AD | Admit: 2014-02-19 | Discharge: 2014-02-20 | Disposition: A | Discharge status: Home / Self Care | Payer: Medicaid Other | Source: Ambulatory Visit | Attending: Emergency Medicine | Admitting: Emergency Medicine

## 2014-02-19 ENCOUNTER — Encounter (HOSPITAL_COMMUNITY): Payer: Self-pay | Admitting: *Deleted

## 2014-02-19 DIAGNOSIS — I509 Heart failure, unspecified: Secondary | ICD-10-CM | POA: Insufficient documentation

## 2014-02-19 DIAGNOSIS — J4489 Other specified chronic obstructive pulmonary disease: Secondary | ICD-10-CM | POA: Insufficient documentation

## 2014-02-19 DIAGNOSIS — B9689 Other specified bacterial agents as the cause of diseases classified elsewhere: Secondary | ICD-10-CM | POA: Insufficient documentation

## 2014-02-19 DIAGNOSIS — N760 Acute vaginitis: Secondary | ICD-10-CM

## 2014-02-19 DIAGNOSIS — N898 Other specified noninflammatory disorders of vagina: Secondary | ICD-10-CM

## 2014-02-19 DIAGNOSIS — I10 Essential (primary) hypertension: Secondary | ICD-10-CM

## 2014-02-19 DIAGNOSIS — F17200 Nicotine dependence, unspecified, uncomplicated: Secondary | ICD-10-CM | POA: Insufficient documentation

## 2014-02-19 DIAGNOSIS — J449 Chronic obstructive pulmonary disease, unspecified: Secondary | ICD-10-CM

## 2014-02-19 DIAGNOSIS — IMO0001 Reserved for inherently not codable concepts without codable children: Secondary | ICD-10-CM

## 2014-02-19 DIAGNOSIS — A499 Bacterial infection, unspecified: Secondary | ICD-10-CM | POA: Insufficient documentation

## 2014-02-19 DIAGNOSIS — R0602 Shortness of breath: Secondary | ICD-10-CM

## 2014-02-19 HISTORY — DX: Heart failure, unspecified: I50.9

## 2014-02-19 NOTE — MAU Provider Note (Signed)
History     CSN: [id number]  Arrival date and time: 02/20/14 0040   First Provider Initiated Contact with Patient 02/19/14 2322      Chief Complaint  Patient presents with  . Vaginal Itching  . Vaginal Discharge   HPI [name] is a 53 y.o. G1P1 with history of HTN, COPD, CHF presents to MAU today with complaint of vaginal discharge and irritation. The patient was admitted to [facility] in February with CHF 2/2 HTN. She was started on Lasix, but has not had that medication x 1 week. She feels SOB, similar to when she "had fluid on her lungs." She has not taken her anti-hypertension medications today. She does have mild headache, blurred vision, floaters and LE edema. She denies numbness, tingling, loss or sensation or paralysis. She states that she has seen a light brown discharge x 1 week. She states that it is itching, irritative and malodorous. She states LMP of 01/28/14. She denies pelvic pain or fever.    OB History   Grav Para Term Preterm Abortions TAB SAB Ect Mult Living   1 1        1       Past Medical History  Diagnosis Date  . 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   Marland Kitchen Hypertension   . Bronchitis   . COPD (chronic obstructive pulmonary disease)   . Tingling in extremities 12/12/2010    Uric acid and electrolytes (02/23) normal but WBC elevated.   D/Dx: carpal tunnel, ulnar neuropathy Less likely: cervical radiculopathy, vasculitis   . CHF (congestive heart failure)     Past Surgical History  Procedure Laterality Date  . Cesarean section      Family History  Problem Relation Age of Onset  . Hypertension Mother   . Hypertension Father     History  Substance Use Topics  . Smoking status: Current Every Day Smoker -- 0.50 packs/day    Types: Cigarettes  . Smokeless tobacco: Never Used  . Alcohol Use: No    Allergies: No Known Allergies   (Not in a hospital admission)  Review of Systems  Constitutional: Negative for  fever and malaise/fatigue.  Eyes: Positive for blurred vision.  Respiratory: Positive for shortness of breath and wheezing.   Cardiovascular: Negative for chest pain.  Gastrointestinal: Negative for nausea, vomiting and abdominal pain.  Genitourinary: Negative for dysuria, urgency and frequency.       Neg - vaginal bleeding + vaginal discharge  Neurological: Positive for headaches.   Physical Exam   Blood pressure 182/96, pulse 81, temperature 98.1 F (36.7 C), temperature source Oral, resp. rate 18, height 5' 3"  (1.6 m), weight 268 lb (121.564 kg), last menstrual period 01/28/2014, SpO2 97.00%.  Physical Exam  Constitutional: She is oriented to person, place, and time. She appears well-developed and well-nourished. No distress.  HENT:  Head: Normocephalic.  Cardiovascular: Normal rate, regular rhythm and normal heart sounds.   Respiratory: Effort normal and breath sounds normal. No respiratory distress. She has no wheezes. She has no rales.  GI: Soft. She exhibits distension. She exhibits no mass. There is no tenderness. There is no rebound and no guarding.  Genitourinary: Uterus is not enlarged and not tender. Cervix exhibits no motion tenderness, no discharge and no friability. Right adnexum displays no mass and no tenderness. Left adnexum displays no mass and no tenderness. No bleeding around the vagina. Vaginal discharge (scant thin white discharge noted) found.  Musculoskeletal: She exhibits edema (2+ pitting edema to the knee bilaterally).  Neurological: She is alert and oriented to person, place, and time.  Skin: Skin is warm and dry. No erythema.  Psychiatric: She has a normal mood and affect.   Results for orders placed during the hospital encounter of 02/19/14 (from the past 24 hour(s))  WET PREP, GENITAL     Status: Abnormal   Collection Time    02/19/14 11:35 PM      Result Value Ref Range   Yeast Wet Prep HPF POC NONE SEEN  NONE SEEN   Trich, Wet Prep NONE SEEN  NONE  SEEN   Clue Cells Wet Prep HPF POC FEW (*) NONE SEEN   WBC, Wet Prep HPF POC FEW (*) NONE SEEN    MAU Course  Procedures None  MDM Wet prep and GC/Chlamydia Discussed patient with Dr. Tomi Bamberger at [facility] for transfer by [employer] and Plan  A: Bacterial vaginosis HTN CHF COPD  P: Rx for Flagyl given to patient Transfer to [facility] for further evaluation of exacerbation of chronic conditions   Farris Has, PA-C  02/20/2014, 12:45 AM

## 2014-02-19 NOTE — MAU Note (Signed)
Pt reports vaginal discharge with odor, and vaginal itching. Symptoms x one week

## 2014-02-20 ENCOUNTER — Inpatient Hospital Stay (HOSPITAL_COMMUNITY): Payer: Medicaid Other

## 2014-02-20 DIAGNOSIS — N898 Other specified noninflammatory disorders of vagina: Secondary | ICD-10-CM

## 2014-02-20 LAB — CBC WITH DIFFERENTIAL/PLATELET
Basophils Absolute: 0 10*3/uL (ref 0.0–0.1)
Basophils Relative: 0 % (ref 0–1)
EOS PCT: 1 % (ref 0–5)
Eosinophils Absolute: 0.2 10*3/uL (ref 0.0–0.7)
HEMATOCRIT: 35.9 % — AB (ref 36.0–46.0)
Hemoglobin: 11.3 g/dL — ABNORMAL LOW (ref 12.0–15.0)
LYMPHS ABS: 2.4 10*3/uL (ref 0.7–4.0)
LYMPHS PCT: 22 % (ref 12–46)
MCH: 23.4 pg — ABNORMAL LOW (ref 26.0–34.0)
MCHC: 31.5 g/dL (ref 30.0–36.0)
MCV: 74.5 fL — AB (ref 78.0–100.0)
Monocytes Absolute: 1 10*3/uL (ref 0.1–1.0)
Monocytes Relative: 9 % (ref 3–12)
Neutro Abs: 7.1 10*3/uL (ref 1.7–7.7)
Neutrophils Relative %: 67 % (ref 43–77)
Platelets: 254 10*3/uL (ref 150–400)
RBC: 4.82 MIL/uL (ref 3.87–5.11)
RDW: 18.1 % — ABNORMAL HIGH (ref 11.5–15.5)
WBC: 10.6 10*3/uL — ABNORMAL HIGH (ref 4.0–10.5)

## 2014-02-20 LAB — WET PREP, GENITAL
Trich, Wet Prep: NONE SEEN
Yeast Wet Prep HPF POC: NONE SEEN

## 2014-02-20 LAB — BASIC METABOLIC PANEL
BUN: 16 mg/dL (ref 6–23)
CO2: 24 meq/L (ref 19–32)
CREATININE: 1.23 mg/dL — AB (ref 0.50–1.10)
Calcium: 8.8 mg/dL (ref 8.4–10.5)
Chloride: 101 mEq/L (ref 96–112)
GFR calc Af Amer: 58 mL/min — ABNORMAL LOW (ref >90)
GFR calc non Af Amer: 50 mL/min — ABNORMAL LOW (ref >90)
GLUCOSE: 101 mg/dL — AB (ref 70–99)
Potassium: 3.6 mEq/L — ABNORMAL LOW (ref 3.7–5.3)
Sodium: 137 mEq/L (ref 137–147)

## 2014-02-20 LAB — PRO B NATRIURETIC PEPTIDE: Pro B Natriuretic peptide (BNP): 128.5 pg/mL — ABNORMAL HIGH (ref 0–125)

## 2014-02-20 LAB — I-STAT TROPONIN, ED: TROPONIN I, POC: 0.03 ng/mL (ref 0.00–0.08)

## 2014-02-20 MED ORDER — FUROSEMIDE 10 MG/ML IJ SOLN
40.0000 mg | INTRAMUSCULAR | Status: AC
  Administered 2014-02-20: 40 mg via INTRAVENOUS
  Filled 2014-02-20: qty 4

## 2014-02-20 MED ORDER — METRONIDAZOLE 500 MG PO TABS: 500.0000 mg | ORAL_TABLET | Freq: Two times a day (BID) | ORAL | Status: DC

## 2014-02-20 MED ORDER — FUROSEMIDE 40 MG PO TABS: 40.0000 mg | ORAL_TABLET | Freq: Every day | ORAL | Status: DC

## 2014-02-20 MED ORDER — FUROSEMIDE 80 MG PO TABS: 80.0000 mg | ORAL_TABLET | Freq: Every day | ORAL | Status: DC

## 2014-02-20 MED ORDER — CLONIDINE HCL 0.1 MG PO TABS
0.2000 mg | ORAL_TABLET | Freq: Once | ORAL | Status: AC
  Administered 2014-02-20: 0.2 mg via ORAL
  Filled 2014-02-20: qty 2

## 2014-02-20 NOTE — ED Notes (Signed)
Patient arrived at this time, transported by [facility].

## 2014-02-20 NOTE — ED Notes (Signed)
Patient ambulated to restroom without difficulty.

## 2014-02-20 NOTE — ED Notes (Signed)
EKG 0101 leads reversed, EKG 0107 given to provider.

## 2014-02-20 NOTE — ED Provider Notes (Signed)
Medical screening examination/treatment/procedure(s) were performed by non-physician practitioner and as supervising physician I was immediately available for consultation/collaboration.   EKG Interpretation None        Julianne Rice, MD 02/20/14 [phone]

## 2014-02-20 NOTE — ED Notes (Signed)
Lab at bedside

## 2014-02-20 NOTE — Discharge Instructions (Signed)
Continue to take your lasix. Follow up with your doctor for further evaluation. Return to the ED with worsening or concerning symptoms. Refer to attached documents for more information.

## 2014-02-20 NOTE — ED Notes (Signed)
Patient c/o SOB. States she has been off her lasix for about 1 week. States she is taking her other medications. Patient reports she has a doctor appt on Tuesday to get her Lasix refilled and does not see the need for a medical workup tonight. Patient requested pain medication for her chronic bilateral hip pain.  PA notified.

## 2014-02-20 NOTE — MAU Provider Note (Signed)
Attestation of Attending Supervision of Advanced Practitioner (CNM/NP): Evaluation and management procedures were performed by the Advanced Practitioner under my supervision and collaboration. I have reviewed the Advanced Practitioner's note and chart, and I agree with the management and plan.  Guss Bunde 8:36 AM

## 2014-02-20 NOTE — ED Provider Notes (Signed)
CSN: [id number]     Arrival date & time 02/20/14  0040 History   First MD Initiated Contact with Patient 02/20/14 0053     Chief Complaint  Patient presents with  . Vaginal Itching  . Vaginal Discharge  . Shortness of Breath     (Consider location/radiation/quality/duration/timing/severity/associated sxs/prior Treatment) HPI Comments: Patient is a 53 year old female with a past medical history of CHF and hypertension who presents from [facility] with a complaint of SOB for the past week. Symptoms started gradually and progressively worsened since the onset. Patient reports being out of her Lasix for the past week which is when her symptoms started. She reports lower extremity edema and thinks she is "retaining fluid." No aggravating/alleviating factors. No other associated symptoms. Patient denies chest pain, orthopnea and dyspnea on exertion. She is a smoker.    Past Medical History  Diagnosis Date  . 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   Marland Kitchen Hypertension   . Bronchitis   . COPD (chronic obstructive pulmonary disease)   . Tingling in extremities 12/12/2010    Uric acid and electrolytes (02/23) normal but WBC elevated.   D/Dx: carpal tunnel, ulnar neuropathy Less likely: cervical radiculopathy, vasculitis   . CHF (congestive heart failure)    Past Surgical History  Procedure Laterality Date  . Cesarean section     Family History  Problem Relation Age of Onset  . Hypertension Mother   . Hypertension Father    History  Substance Use Topics  . Smoking status: Current Every Day Smoker -- 0.50 packs/day    Types: Cigarettes  . Smokeless tobacco: Never Used  . Alcohol Use: No   OB History   Grav Para Term Preterm Abortions TAB SAB Ect Mult Living   1 1        1      Review of Systems  Constitutional: Negative for fever, chills and fatigue.  HENT: Negative for trouble swallowing.   Eyes: Negative for visual disturbance.  Respiratory:  Positive for shortness of breath.   Cardiovascular: Positive for leg swelling. Negative for chest pain and palpitations.  Gastrointestinal: Negative for nausea, vomiting, abdominal pain and diarrhea.  Genitourinary: Negative for dysuria and difficulty urinating.  Musculoskeletal: Negative for arthralgias and neck pain.  Skin: Negative for color change.  Neurological: Negative for dizziness and weakness.  Psychiatric/Behavioral: Negative for dysphoric mood.      Allergies  Review of patient's allergies indicates no known allergies.  Home Medications   Prior to Admission medications   Medication Sig Start Date End Date Taking? Authorizing Provider  albuterol (PROVENTIL HFA;VENTOLIN HFA) 108 (90 BASE) MCG/ACT inhaler Inhale 2 puffs into the lungs every 6 (six) hours as needed for wheezing or shortness of breath. 12/11/13  Yes Dayarmys Pil[place] de Gwendalyn Ege, MD  benzonatate (TESSALON PERLES) 100 MG capsule Take 1 capsule (100 mg total) by mouth 3 (three) times daily as needed for cough. 12/11/13  Yes Dayarmys [place] de Gwendalyn Ege, MD  ibuprofen (ADVIL,MOTRIN) 200 MG tablet Take 400 mg by mouth every 6 (six) hours as needed (pain).   Yes Historical Provider, MD  metoprolol tartrate (LOPRESSOR) 50 MG tablet Take 1 tablet (50 mg total) by mouth 2 (two) times daily. 01/23/14  Yes Leone Haven, MD  spironolactone (ALDACTONE) 25 MG tablet Take 1 tablet (25 mg total) by mouth daily. 12/30/13  Yes Josalyn C Funches, MD  furosemide (LASIX) 20 MG tablet TAKE 2  TABLETS (40 MG TOTAL) BY MOUTH DAILY.    Leone Haven, MD  metroNIDAZOLE (FLAGYL) 500 MG tablet Take 1 tablet (500 mg total) by mouth 2 (two) times daily. 02/20/14   Farris Has, PA-C   BP 182/96  Pulse 81  Temp(Src) 98.1 F (36.7 C) (Oral)  Resp 18  Ht 5' 3"  (1.6 m)  Wt 268 lb (121.564 kg)  BMI 47.49 kg/m2  SpO2 97%  LMP 01/28/2014 Physical Exam  Nursing note and vitals reviewed. Constitutional: She is oriented to person, place, and  time. She appears well-developed and well-nourished. No distress.  HENT:  Head: Normocephalic and atraumatic.  Eyes: Conjunctivae and EOM are normal.  Cardiovascular: Normal rate and regular rhythm.  Exam reveals no gallop and no friction rub.   No murmur heard. Pulmonary/Chest: Effort normal and breath sounds normal. She has no wheezes. She has no rales. She exhibits no tenderness.  Abdominal: Soft. She exhibits no distension. There is no tenderness. There is no rebound.  Musculoskeletal: Normal range of motion.  2+ bilateral lower extremity edema. No calf tenderness to palpation.   Neurological: She is alert and oriented to person, place, and time. Coordination normal.  Speech is goal-oriented. Moves limbs without ataxia.   Skin: Skin is warm and dry.  Psychiatric: She has a normal mood and affect. Her behavior is normal.    ED Course  Procedures (including critical care time) Labs Review Labs Reviewed  WET PREP, GENITAL - Abnormal; Notable for the following:    Clue Cells Wet Prep HPF POC FEW (*)    WBC, Wet Prep HPF POC FEW (*)    All other components within normal limits  CBC WITH DIFFERENTIAL - Abnormal; Notable for the following:    WBC 10.6 (*)    Hemoglobin 11.3 (*)    HCT 35.9 (*)    MCV 74.5 (*)    MCH 23.4 (*)    RDW 18.1 (*)    All other components within normal limits  BASIC METABOLIC PANEL - Abnormal; Notable for the following:    Potassium 3.6 (*)    Glucose, Bld 101 (*)    Creatinine, Ser 1.23 (*)    GFR calc non Af Amer 50 (*)    GFR calc Af Amer 58 (*)    All other components within normal limits  PRO B NATRIURETIC PEPTIDE - Abnormal; Notable for the following:    Pro B Natriuretic peptide (BNP) 128.5 (*)    All other components within normal limits  GC/CHLAMYDIA PROBE AMP  I-STAT TROPOININ, ED    Imaging Review Dg Chest 2 View  02/20/2014   CLINICAL DATA:  Short of breath for 1 week  EXAM: CHEST  2 VIEW  COMPARISON:  DG CHEST 2 VIEW dated 12/09/2013   FINDINGS: There is mild bilateral interstitial prominence. There is no focal parenchymal opacity, pleural effusion, or pneumothorax. The heart and mediastinal contours are unremarkable.  The osseous structures are unremarkable.  IMPRESSION: No active cardiopulmonary disease.   Electronically Signed   By: Kathreen Devoid   On: 02/20/2014 01:29     EKG Interpretation None      MDM   Final diagnoses:  Vaginal discharge  Hypertension  COPD with bronchitis  CHF (congestive heart failure)    2:03 AM Patient's labs and chest xray unremarkable here. Vitals stable and patient afebrile. Patient has lower extremity edema which is chronic. Patient has not taken lasix in 1 week which is likely why she is having  increasing SOB. I doubt PE at this time or cardiac cause. Patient will have IV lasix here and instructions to follow up with her PCP.     Alvina Chou, [place] 02/20/14 [phone]

## 2014-02-20 NOTE — ED Notes (Signed)
Bed: WG66 Expected date: 02/19/14 Expected time: 11:57 PM Means of arrival: Ambulance Comments: Pt from [facility]

## 2014-02-21 ENCOUNTER — Other Ambulatory Visit: Payer: Medicaid Other

## 2014-02-21 ENCOUNTER — Ambulatory Visit (INDEPENDENT_AMBULATORY_CARE_PROVIDER_SITE_OTHER): Payer: No Typology Code available for payment source | Admitting: Family Medicine

## 2014-02-21 ENCOUNTER — Inpatient Hospital Stay (HOSPITAL_COMMUNITY): Payer: Medicaid Other

## 2014-02-21 ENCOUNTER — Encounter (HOSPITAL_COMMUNITY): Payer: Self-pay | Admitting: General Practice

## 2014-02-21 ENCOUNTER — Encounter: Payer: Self-pay | Admitting: Family Medicine

## 2014-02-21 ENCOUNTER — Inpatient Hospital Stay (HOSPITAL_COMMUNITY)
Admission: AD | Admit: 2014-02-21 | Discharge: 2014-02-24 | DRG: 292 | Disposition: A | Discharge status: Home / Self Care | Payer: Medicaid Other | Source: Ambulatory Visit | Attending: Family Medicine | Admitting: Family Medicine

## 2014-02-21 VITALS — BP 194/120 | HR 81 | Temp 97.8°F | Ht 63.0 in | Wt 272.0 lb

## 2014-02-21 DIAGNOSIS — R0602 Shortness of breath: Secondary | ICD-10-CM | POA: Diagnosis present

## 2014-02-21 DIAGNOSIS — Z91199 Patient's noncompliance with other medical treatment and regimen due to unspecified reason: Secondary | ICD-10-CM

## 2014-02-21 DIAGNOSIS — K7581 Nonalcoholic steatohepatitis (NASH): Secondary | ICD-10-CM

## 2014-02-21 DIAGNOSIS — J449 Chronic obstructive pulmonary disease, unspecified: Secondary | ICD-10-CM | POA: Diagnosis present

## 2014-02-21 DIAGNOSIS — M545 Low back pain, unspecified: Secondary | ICD-10-CM | POA: Diagnosis present

## 2014-02-21 DIAGNOSIS — I509 Heart failure, unspecified: Secondary | ICD-10-CM | POA: Diagnosis present

## 2014-02-21 DIAGNOSIS — I5189 Other ill-defined heart diseases: Secondary | ICD-10-CM

## 2014-02-21 DIAGNOSIS — Z9119 Patient's noncompliance with other medical treatment and regimen: Secondary | ICD-10-CM

## 2014-02-21 DIAGNOSIS — F1911 Other psychoactive substance abuse, in remission: Secondary | ICD-10-CM | POA: Diagnosis present

## 2014-02-21 DIAGNOSIS — J4489 Other specified chronic obstructive pulmonary disease: Secondary | ICD-10-CM | POA: Diagnosis present

## 2014-02-21 DIAGNOSIS — N760 Acute vaginitis: Secondary | ICD-10-CM | POA: Diagnosis present

## 2014-02-21 DIAGNOSIS — F17200 Nicotine dependence, unspecified, uncomplicated: Secondary | ICD-10-CM | POA: Diagnosis present

## 2014-02-21 DIAGNOSIS — I5033 Acute on chronic diastolic (congestive) heart failure: Secondary | ICD-10-CM | POA: Diagnosis not present

## 2014-02-21 DIAGNOSIS — K7689 Other specified diseases of liver: Secondary | ICD-10-CM | POA: Diagnosis present

## 2014-02-21 DIAGNOSIS — R29898 Other symptoms and signs involving the musculoskeletal system: Secondary | ICD-10-CM | POA: Diagnosis present

## 2014-02-21 DIAGNOSIS — I10 Essential (primary) hypertension: Secondary | ICD-10-CM | POA: Diagnosis present

## 2014-02-21 DIAGNOSIS — Z6841 Body Mass Index (BMI) 40.0 and over, adult: Secondary | ICD-10-CM

## 2014-02-21 DIAGNOSIS — G8929 Other chronic pain: Secondary | ICD-10-CM | POA: Diagnosis present

## 2014-02-21 DIAGNOSIS — Z8249 Family history of ischemic heart disease and other diseases of the circulatory system: Secondary | ICD-10-CM

## 2014-02-21 HISTORY — DX: Unspecified chronic bronchitis: J42

## 2014-02-21 HISTORY — DX: Low back pain, unspecified: M54.50

## 2014-02-21 HISTORY — DX: Unspecified osteoarthritis, unspecified site: M19.90

## 2014-02-21 HISTORY — DX: Migraine, unspecified, not intractable, without status migrainosus: G43.909

## 2014-02-21 HISTORY — DX: Other chronic pain: G89.29

## 2014-02-21 HISTORY — DX: Low back pain: M54.5

## 2014-02-21 LAB — COMPREHENSIVE METABOLIC PANEL
ALBUMIN: 3.1 g/dL — AB (ref 3.5–5.2)
ALK PHOS: 94 U/L (ref 39–117)
ALT: 12 U/L (ref 0–35)
AST: 15 U/L (ref 0–37)
BUN: 15 mg/dL (ref 6–23)
CHLORIDE: 102 meq/L (ref 96–112)
CO2: 25 meq/L (ref 19–32)
Calcium: 8.8 mg/dL (ref 8.4–10.5)
Creatinine, Ser: 1.22 mg/dL — ABNORMAL HIGH (ref 0.50–1.10)
GFR calc Af Amer: 59 mL/min — ABNORMAL LOW (ref >90)
GFR calc non Af Amer: 51 mL/min — ABNORMAL LOW (ref >90)
Glucose, Bld: 104 mg/dL — ABNORMAL HIGH (ref 70–99)
Potassium: 3.5 mEq/L — ABNORMAL LOW (ref 3.7–5.3)
Sodium: 138 mEq/L (ref 137–147)
Total Protein: 6.7 g/dL (ref 6.0–8.3)

## 2014-02-21 LAB — GC/CHLAMYDIA PROBE AMP
CT Probe RNA: NEGATIVE
GC Probe RNA: NEGATIVE

## 2014-02-21 LAB — CBC
HCT: 37.2 % (ref 36.0–46.0)
Hemoglobin: 11.9 g/dL — ABNORMAL LOW (ref 12.0–15.0)
MCH: 24 pg — AB (ref 26.0–34.0)
MCHC: 32 g/dL (ref 30.0–36.0)
MCV: 75 fL — ABNORMAL LOW (ref 78.0–100.0)
Platelets: 266 10*3/uL (ref 150–400)
RBC: 4.96 MIL/uL (ref 3.87–5.11)
RDW: 18.5 % — AB (ref 11.5–15.5)
WBC: 10.5 10*3/uL (ref 4.0–10.5)

## 2014-02-21 LAB — BASIC METABOLIC PANEL
BUN: 11 mg/dL (ref 6–23)
CHLORIDE: 105 meq/L (ref 96–112)
CO2: 29 mEq/L (ref 19–32)
Calcium: 9.1 mg/dL (ref 8.4–10.5)
Creat: 1.15 mg/dL — ABNORMAL HIGH (ref 0.50–1.10)
GLUCOSE: 86 mg/dL (ref 70–99)
POTASSIUM: 4.2 meq/L (ref 3.5–5.3)
Sodium: 137 mEq/L (ref 135–145)

## 2014-02-21 LAB — TROPONIN I: Troponin I: <0.3 ng/mL (ref <0.30)

## 2014-02-21 LAB — PRO B NATRIURETIC PEPTIDE: PRO B NATRI PEPTIDE: 162.5 pg/mL — AB (ref 0–125)

## 2014-02-21 MED ORDER — POTASSIUM CHLORIDE CRYS ER 20 MEQ PO TBCR
40.0000 meq | EXTENDED_RELEASE_TABLET | Freq: Once | ORAL | Status: AC
  Administered 2014-02-22: 40 meq via ORAL
  Filled 2014-02-21: qty 2

## 2014-02-21 MED ORDER — SODIUM CHLORIDE 0.9 % IJ SOLN: 3.0000 mL | INTRAMUSCULAR | Status: DC | PRN

## 2014-02-21 MED ORDER — HYDRALAZINE HCL 20 MG/ML IJ SOLN
10.0000 mg | INTRAMUSCULAR | Status: DC | PRN
  Administered 2014-02-21 – 2014-02-23 (×4): 10 mg via INTRAVENOUS
  Filled 2014-02-21 (×4): qty 1

## 2014-02-21 MED ORDER — ACETAMINOPHEN 325 MG PO TABS
650.0000 mg | ORAL_TABLET | Freq: Four times a day (QID) | ORAL | Status: DC | PRN
  Administered 2014-02-21 – 2014-02-23 (×3): 650 mg via ORAL
  Filled 2014-02-21 (×3): qty 2

## 2014-02-21 MED ORDER — ALBUTEROL SULFATE HFA 108 (90 BASE) MCG/ACT IN AERS: 2.0000 | INHALATION_SPRAY | RESPIRATORY_TRACT | Status: DC | PRN

## 2014-02-21 MED ORDER — HEPARIN SODIUM (PORCINE) 5000 UNIT/ML IJ SOLN
5000.0000 [IU] | Freq: Three times a day (TID) | INTRAMUSCULAR | Status: DC
  Filled 2014-02-21 (×3): qty 1

## 2014-02-21 MED ORDER — FUROSEMIDE 10 MG/ML IJ SOLN
40.0000 mg | Freq: Once | INTRAMUSCULAR | Status: AC
  Administered 2014-02-21: 40 mg via INTRAMUSCULAR

## 2014-02-21 MED ORDER — FUROSEMIDE 10 MG/ML IJ SOLN
40.0000 mg | Freq: Once | INTRAMUSCULAR | Status: AC
  Administered 2014-02-21: 40 mg via INTRAVENOUS
  Filled 2014-02-21: qty 4

## 2014-02-21 MED ORDER — BENZONATATE 100 MG PO CAPS
100.0000 mg | ORAL_CAPSULE | Freq: Three times a day (TID) | ORAL | Status: DC | PRN
  Administered 2014-02-22 – 2014-02-23 (×4): 100 mg via ORAL
  Filled 2014-02-21 (×5): qty 1

## 2014-02-21 MED ORDER — ALBUTEROL SULFATE (2.5 MG/3ML) 0.083% IN NEBU
2.5000 mg | INHALATION_SOLUTION | RESPIRATORY_TRACT | Status: DC | PRN
  Administered 2014-02-23 – 2014-02-24 (×2): 2.5 mg via RESPIRATORY_TRACT
  Filled 2014-02-21 (×3): qty 3

## 2014-02-21 MED ORDER — SPIRONOLACTONE 25 MG PO TABS
25.0000 mg | ORAL_TABLET | Freq: Every day | ORAL | Status: DC
  Administered 2014-02-21 – 2014-02-24 (×4): 25 mg via ORAL
  Filled 2014-02-21 (×4): qty 1

## 2014-02-21 MED ORDER — SODIUM CHLORIDE 0.9 % IJ SOLN
3.0000 mL | Freq: Two times a day (BID) | INTRAMUSCULAR | Status: DC
  Administered 2014-02-21 – 2014-02-23 (×5): 3 mL via INTRAVENOUS

## 2014-02-21 MED ORDER — SODIUM CHLORIDE 0.9 % IJ SOLN
3.0000 mL | Freq: Two times a day (BID) | INTRAMUSCULAR | Status: DC
  Administered 2014-02-22 – 2014-02-24 (×3): 3 mL via INTRAVENOUS

## 2014-02-21 MED ORDER — SODIUM CHLORIDE 0.9 % IV SOLN: 250.0000 mL | INTRAVENOUS | Status: DC | PRN

## 2014-02-21 MED ORDER — METRONIDAZOLE 500 MG PO TABS
500.0000 mg | ORAL_TABLET | Freq: Two times a day (BID) | ORAL | Status: DC
  Administered 2014-02-22 – 2014-02-24 (×6): 500 mg via ORAL
  Filled 2014-02-21 (×7): qty 1

## 2014-02-21 NOTE — Progress Notes (Addendum)
1730 Arrived to 3e 15 ambulatory.Hailey Barnes  Room done. Pt with some sob , with exertional dyspnea , With expiratory wheezing on auscultation. Kept comfortable in bed.Will notify MD pt and daughter aware

## 2014-02-21 NOTE — Progress Notes (Signed)
BMP DONE TODAY Dayne Dekay 

## 2014-02-21 NOTE — Progress Notes (Signed)
Patient notified nurse of recently going to doctor and was diagnosed with vaginal bacteria and was not able to get her prescription filled - Flagyl in which she would have got it filled today but was admitted into the hospital. Patient wanted to know if doctor could start her on it while in the hospital. Also patient has a sever cough. Notified on-call Family Medicine, Dr. Tamala Julian. Orders given. Will carry out orders and continue to monitor patient to end of shift.

## 2014-02-21 NOTE — Progress Notes (Signed)
Patient ID: Hailey Barnes, female   DOB: Feb 25, 1961, 53 y.o.   MRN: [id number]  Tommi Rumps, MD Phone: [phone]  Hailey Barnes is a 53 y.o. female who presents today for f/u.  Patient presents with shortness of breath. Notes being out of lasix for the past week. Has not been taking aldactone either. Only on metoprolol for her blood pressure, notes she had amlodipine and HCTZ previously stopped. Notes orthopnea (has to sleep sitting up), PND, chest tightness, non-productive cough. This brought her to the ED 2 days ago. She was found to have a BNP of 128, negative trop, and negative CXR. They gave her IV lasix and she felt improved with this though feels as though her fluid has reaccumulated. She notes swelling in her legs. Notes shortness of breath walking in to the office from her car. Patients weight is up 4 pounds over the past 36 hours. Dry weight appears to be around 260.  Additionally she notes back pain in her lower back. In bilateral paraspinous area. Previously seen for this issue and given exercises to do at home. Has tried these without any improvement. Taking ibuprofen for this without benefit. Denies issues with BM, though does note decreased urine output only when not taking lasix. No saddle anesthesia.  Patient is a smoker.   ROS: Per HPI   Physical Exam Filed Vitals:   02/21/14 1437  BP: 194/120  Pulse: 81  Temp: 97.8 F (36.6 C)  96% O2 on RA ambulation  Gen: Well NAD HEENT: EOMI,  MMM Lungs: CTABL Nl WOB, no wheezes or crackles Heart: RRR no MRG MSK: no tenderness to palpation in bilateral lumbar spine, negative sitting leg raise Neuro: 5/5 strength in bilateral hip flexors, quads, hamstrings, plantar and dorsiflexion, sensation to light touch intact in bilateral LE, 2 + patellar reflexes Exts: 1+ pitting edema bilaterally, warm and well perfused.     Chemistry      Component Value Date/Time   NA 137 02/20/2014 0110   K 3.6* 02/20/2014 0110     CL 101 02/20/2014 0110   CO2 24 02/20/2014 0110   BUN 16 02/20/2014 0110   CREATININE 1.23* 02/20/2014 0110   CREATININE 1.40* 01/23/2014 1502      Component Value Date/Time   CALCIUM 8.8 02/20/2014 0110   ALKPHOS 90 12/30/2013 1049   AST 17 12/30/2013 1049   ALT 18 12/30/2013 1049   BILITOT 0.4 12/30/2013 1049      Lab Results  Component Value Date   WBC 10.6* 02/20/2014   HGB 11.3* 02/20/2014   HCT 35.9* 02/20/2014   MCV 74.5* 02/20/2014   PLT 254 02/20/2014    Assessment/Plan: patient to be direct admitted to the hospital for HTN urgency and likely CHF exacerbation. Given IM lasix 40 mg in clinic. Offered transport via [facility], though patient refused stating she had to drop her granddaughter off with her mother. Consideration given to COPD exacerbation though less likely given absence of wheezes and no O2 desats. Will need diuresis and getting started on additional blood pressure medications. Consideration of lumbar spine film with continued back pain. Advised inpatient team that the patient would be admitted and appreciate all there help in the admission of this patient.

## 2014-02-21 NOTE — Progress Notes (Signed)
59 MD notified of admission  Will see the pt shortly

## 2014-02-21 NOTE — H&P (Signed)
[facility] Admission History and Physical Service Pager: [phone]  Patient name: Hailey Barnes Medical record number: [id number] Date of birth: Jun 12, 1961 Age: 53 y.o. Gender: female  Primary Care Provider: Tommi Rumps, MD Consultants: none Code Status: full  Chief Complaint: SOB, wheezing, chest tightness  Assessment and Plan: Hailey Barnes is a 53 y.o. female presenting with worsening SOB, wheezing in last 2-3 days. PMH is significant for HTN, tobacco use, NASH, diastolic dysfunction  #CV: hx of HTN/CHF exacerbation; assc SOB likely related to recent poor medication compliance in addition to HTN urgency. Has been trying to decrease sodium intake (but had recent indiscretions). S/p IV lasix 40 in ED 2 days ago and 64m IM today in clinic. Recently had decreased in lasix from 80 to 40 2/2 AKI. Will plan to continue to diurese and treat BPs. Recent ECHO in 11/2013 showing grade ii diastolic dysfunction, EF 677-82%-admit to tele under Dr. EGwendlyn Barnes -will give an additional 417mIV lasix for now -monitor Cr closely given above -restart aldactone 2551mhold metoprolol for now in light of possible acute CHF exacerbation  -strict i/os -daily weights -hold on repeat ECHO for now  -vitals per floor protocol -12lead EKG -cycle trops - CXR for eval of pulmonary edema.  - will treat HTN with hydralazine IV prn. Patient may need to be started on additional agent prior to discharge.   # Renal: h/o renal insufficiency in setting of increased diuresis. Recent Cr of 1.23 - check BMP  - monitor Cr in setting of diuresis.   #Pulm: COPD; tobacco abuse; constant chest tightness, current every day smoker; does not appear to be in COPD exacerbation at this time; feel cough is 2/2 cardiac etiology vs intrinsic pulm  -albuterol prn (home medication) -IS -encourage smoking cessation -nicotine patch as needed -could consider tessalon pearls for cough if  giving significant distress   #Neuro: constant headache, no visual abnormalities, grossly nml neuro exam; likely related to HTN urgency  -will monitor for visual changes -hold on CT head at this time  #MSK- c/o Lumbar spine pain in office with no saddle anesthesia or red flags; failed outpt treatment with exercises and ibuprofen -no complaint of currently -will hold on imaging at this time - stop ibuprofen in setting of heart failure   #FEN/GI: Hx of NASH, obesity -trend with CMET tmw morning -HHD  Prophylaxis: hsq  Disposition: admit to tele under Dr. EniGwendlyn DeutscherHistory of Present Illness: Hailey Barnes a [age]o. female presenting with SOB. Recalls being out of lasix since last week and aldactone since yesterday. Has noted increase orthopnea with sleeping upright, PND, chest tightness and nonproductive cough  Recently [facility] ED 2 days ago [facility] where she was found to have BNP of 128, neg trop and neg CXR. Labs notable for Cr of 1.23 (1.4 on 01/23/14). Hgb of 11.3 Was given IV lasix and clonidine with some improvement. However has gained approx 4 pounds in the past 36hrs. Noted worsening headaches, increased WOB with mild exertion, wheezing. Feels a constant tightness in chest, but acutely increased in addition to peripheral edema.   Seen in clinic today. Was given 41m21m lasix. Noted hypoxemia with ambulation. Offered hospital stay for further evaluation. Pt left to drop off granddaughter and returned this evening for admission  Review Of Systems: Per HPI with the  following additions: no nausea, vomiting, diarrhea or constipation Otherwise 12 point review of systems was performed and was unremarkable.  Patient Active Problem List   Diagnosis Date Noted  . CHF exacerbation 02/21/2014  . Back pain 01/26/2014  . NASH (nonalcoholic steatohepatitis) 01/04/2014  . RUQ pain 12/30/2013   . Diastolic dysfunction 60/63/0160  . Hypertension 05/26/2010  . OBESITY, NOS 12/17/2006  . TOBACCO DEPENDENCE 12/17/2006   Past Medical History: Past Medical History  Diagnosis Date  . 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   Marland Kitchen Hypertension   . Bronchitis   . COPD (chronic obstructive pulmonary disease)   . Tingling in extremities 12/12/2010    Uric acid and electrolytes (02/23) normal but WBC elevated.   D/Dx: carpal tunnel, ulnar neuropathy Less likely: cervical radiculopathy, vasculitis   . CHF (congestive heart failure)    Past Surgical History: Past Surgical History  Procedure Laterality Date  . Cesarean section     Social History: History  Substance Use Topics  . Smoking status: Current Every Day Smoker -- 0.50 packs/day    Types: Cigarettes  . Smokeless tobacco: Never Used  . Alcohol Use: No   Additional social history: every day smoker Please also refer to relevant sections of EMR.  Family History: Family History  Problem Relation Age of Onset  . Hypertension Mother   . Hypertension Father    Allergies and Medications: No Known Allergies No current facility-administered medications on file prior to encounter.   Current Outpatient Prescriptions on File Prior to Encounter  Medication Sig Dispense Refill  . albuterol (PROVENTIL HFA;VENTOLIN HFA) 108 (90 BASE) MCG/ACT inhaler Inhale 2 puffs into the lungs every 6 (six) hours as needed for wheezing or shortness of breath.  1 Inhaler  2  . benzonatate (TESSALON PERLES) 100 MG capsule Take 1 capsule (100 mg total) by mouth 3 (three) times daily as needed for cough.  20 capsule  0  . furosemide (LASIX) 20 MG tablet TAKE 2 TABLETS (40 MG TOTAL) BY MOUTH DAILY.  60 tablet  3  . furosemide (LASIX) 40 MG tablet Take 1 tablet (40 mg total) by mouth daily.  30 tablet  0  . ibuprofen (ADVIL,MOTRIN) 200 MG tablet Take 400 mg by mouth every 6 (six) hours as needed (pain).      . metoprolol  tartrate (LOPRESSOR) 50 MG tablet Take 1 tablet (50 mg total) by mouth 2 (two) times daily.  60 tablet  2  . metroNIDAZOLE (FLAGYL) 500 MG tablet Take 1 tablet (500 mg total) by mouth 2 (two) times daily.  14 tablet  0  . spironolactone (ALDACTONE) 25 MG tablet Take 1 tablet (25 mg total) by mouth daily.  30 tablet  0    Objective: BP 166/111  Pulse 87  Temp(Src) 97.8 F (36.6 C) (Oral)  Resp 18  Ht 5' 3"  (1.6 m)  Wt 268 lb 3.2 oz (121.655 kg)  BMI 47.52 kg/m2  SpO2 99%  LMP 01/28/2014 Exam: General: NAD, lying in bed coughing, hoarse voice  HEENT: NCAT, dry MM, PERRL, EOMI Cardiovascular: RRR, nml s1/2 no murmurs, rubs or gallops, JVD minimally appreciated Respiratory: CTAB, no wheezes rhonchi or crackles Abdomen: obese, soft, NTND, no palpable fluid wave Extremities: WWP; 3+ pitting edema bilaterally Skin: no rashes or lesions; tattoos Neuro: A&O; alert and talkative; grossly intact  Labs and Imaging: CBC BMET   Recent Labs Lab 02/20/14 0110  WBC 10.6*  HGB 11.3*  HCT  35.9*  PLT 254    Recent Labs Lab 02/20/14 0110  NA 137  K 3.6*  CL 101  CO2 24  BUN 16  CREATININE 1.23*  GLUCOSE 101*  CALCIUM 8.8      Langston Masker, MD 02/21/2014, 6:32 PM PGY-1, [place] Intern pager: [phone], text pages welcome   Patient seen, examined. Available data reviewed. Agree with findings, assessment, and plan as outlined by Dr. Skeet Simmer.  My additional findings are documented and highlighted above in blue.   Liam Graham, PGY-3 Family Medicine Resident

## 2014-02-21 NOTE — H&P (Signed)
FMTS ATTENDING ADMISSION NOTE Kehinde Eniola,MD I  have seen and examined this patient, reviewed their chart. I have discussed this patient with the resident. I agree with the resident's findings, assessment and care plan.  53 Y/O F with PMX of HTN,CHF,COPD was sent to the hospital by her PCP for worsening SOB for the last few days, she stated her dyspnea is worse on exertion after walking few distance.She has difficulty laying down flat in bed at night, she denies any cough, no wheezing. She also noticed worsening leg swelling B/L. She normally takes Lasix and Aldactone at home for her CHF but ran out of her medication about 1 wk ago. She is currently only on Metoprolol. Patient had been to the ED recently for SOB and was d/c home after stabilization. She stated she does not feel well at home today.  No current facility-administered medications on file prior to encounter.   Current Outpatient Prescriptions on File Prior to Encounter  Medication Sig Dispense Refill  . albuterol (PROVENTIL HFA;VENTOLIN HFA) 108 (90 BASE) MCG/ACT inhaler Inhale 2 puffs into the lungs every 6 (six) hours as needed for wheezing or shortness of breath.  1 Inhaler  2  . benzonatate (TESSALON PERLES) 100 MG capsule Take 1 capsule (100 mg total) by mouth 3 (three) times daily as needed for cough.  20 capsule  0  . furosemide (LASIX) 40 MG tablet Take 1 tablet (40 mg total) by mouth daily.  30 tablet  0  . ibuprofen (ADVIL,MOTRIN) 200 MG tablet Take 400 mg by mouth every 6 (six) hours as needed (pain).      . metoprolol tartrate (LOPRESSOR) 50 MG tablet Take 1 tablet (50 mg total) by mouth 2 (two) times daily.  60 tablet  2  . spironolactone (ALDACTONE) 25 MG tablet Take 1 tablet (25 mg total) by mouth daily.  30 tablet  0  . metroNIDAZOLE (FLAGYL) 500 MG tablet Take 1 tablet (500 mg total) by mouth 2 (two) times daily.  14 tablet  0   Past Medical History  Diagnosis Date  . Substance abuse     s/p Rehab. Now in  remission since Summer 2011.   Marland Kitchen Hypertension   . COPD (chronic obstructive pulmonary disease)   . Tingling in extremities 12/12/2010    Uric acid and electrolytes (02/23) normal but WBC elevated.   D/Dx: carpal tunnel, ulnar neuropathy Less likely: cervical radiculopathy, vasculitis   . CHF (congestive heart failure)   . Family history of anesthesia complication     "mother was put to sleep for surgery @ age 39 and died"  . Chronic bronchitis     "get it q yr" (02/21/2014)  . Migraine 1982-2009  . Arthritis     "knees" (02/21/2014)  . Chronic lower back pain    Filed Vitals:   02/21/14 1739 02/21/14 2001 02/21/14 2109  BP: 166/111 177/98 183/100  Pulse: 87 81   Temp: 97.8 F (36.6 C) 97.7 F (36.5 C)   TempSrc: Oral Oral   Resp: 18 20   Height: 5' 3"  (1.6 m)    Weight: 268 lb 3.2 oz (121.655 kg)    SpO2: 99% 98%    Exam: Gen: In mild distress. HEENT: PERRLA,EOMI Neuro: Awake and alert. Resp: Air entry equal and clear, no wheezing. CV: S1 S2 normal,no murmur. Abd: BEnign. Ext: + B/L pedal swelling.  A/P: 53 Y/O F with 1. SOB: SOB exacerbation vs COPD.    Admit for diuresis.    S/P  Lasix 40 mg IM at the clinic    CXR, EKG recommended.    Telemetry monitoring.    IV lasix, monitor input and output.    May hold Metoprolol for now.    Monitor vital signs including O2 sat.    Check Bmet,TSH.  2. HTN urgency: Likely due to medication non-adherence.     Hydralazine prn.     Restart ALdactone and Lasix.     Add Metoprolol probably tomorrow.     Titrate BP meds as needed.  Disposition: Monitor for improvement.

## 2014-02-22 ENCOUNTER — Inpatient Hospital Stay (HOSPITAL_COMMUNITY): Payer: Medicaid Other

## 2014-02-22 DIAGNOSIS — I509 Heart failure, unspecified: Secondary | ICD-10-CM

## 2014-02-22 DIAGNOSIS — I10 Essential (primary) hypertension: Secondary | ICD-10-CM

## 2014-02-22 DIAGNOSIS — K7689 Other specified diseases of liver: Secondary | ICD-10-CM

## 2014-02-22 DIAGNOSIS — I519 Heart disease, unspecified: Secondary | ICD-10-CM

## 2014-02-22 LAB — BASIC METABOLIC PANEL
BUN: 14 mg/dL (ref 6–23)
CALCIUM: 8.7 mg/dL (ref 8.4–10.5)
CO2: 28 mEq/L (ref 19–32)
CREATININE: 1.15 mg/dL — AB (ref 0.50–1.10)
Chloride: 101 mEq/L (ref 96–112)
GFR calc non Af Amer: 55 mL/min — ABNORMAL LOW (ref >90)
GFR, EST AFRICAN AMERICAN: 63 mL/min — AB (ref >90)
Glucose, Bld: 106 mg/dL — ABNORMAL HIGH (ref 70–99)
Potassium: 3.5 mEq/L — ABNORMAL LOW (ref 3.7–5.3)
Sodium: 138 mEq/L (ref 137–147)

## 2014-02-22 LAB — TROPONIN I: Troponin I: <0.3 ng/mL (ref <0.30)

## 2014-02-22 LAB — D-DIMER, QUANTITATIVE: D-Dimer, Quant: 0.38 ug/mL-FEU (ref 0.00–0.48)

## 2014-02-22 MED ORDER — TRAMADOL HCL 50 MG PO TABS
50.0000 mg | ORAL_TABLET | Freq: Two times a day (BID) | ORAL | Status: DC | PRN
  Administered 2014-02-22 – 2014-02-23 (×2): 50 mg via ORAL
  Filled 2014-02-22 (×2): qty 1

## 2014-02-22 MED ORDER — NICOTINE 14 MG/24HR TD PT24
14.0000 mg | MEDICATED_PATCH | Freq: Every day | TRANSDERMAL | Status: DC
  Administered 2014-02-22 – 2014-02-24 (×3): 14 mg via TRANSDERMAL
  Filled 2014-02-22 (×3): qty 1

## 2014-02-22 MED ORDER — FUROSEMIDE 10 MG/ML IJ SOLN
40.0000 mg | Freq: Once | INTRAMUSCULAR | Status: AC
  Administered 2014-02-22: 40 mg via INTRAVENOUS
  Filled 2014-02-22: qty 4

## 2014-02-22 MED ORDER — METOPROLOL TARTRATE 25 MG PO TABS
25.0000 mg | ORAL_TABLET | Freq: Two times a day (BID) | ORAL | Status: DC
  Administered 2014-02-22 (×2): 25 mg via ORAL
  Filled 2014-02-22 (×4): qty 1

## 2014-02-22 NOTE — Care Management Note (Addendum)
  Page 1 of 1   02/24/2014     5:12:21 PM CARE MANAGEMENT NOTE 02/24/2014  Patient:  Hailey Barnes, Hailey Barnes   Account Number:  [id number]  Date Initiated:  02/22/2014  Documentation initiated by:  Shya Kovatch  Subjective/Objective Assessment:   Admitted with CHF, Urgent HTN     Action/Plan:   CM to follow for disposition needs   Anticipated DC Date:  02/25/2014   Anticipated DC Plan:  HOME/SELF CARE         Choice offered to / List presented to:  C-1 Patient           Status of service:  Completed, signed off Medicare Important Message given?  YES (If response is "NO", the following Medicare IM given date fields will be blank) Date Medicare IM given:  02/22/2014 Date Additional Medicare IM given:    Discharge Disposition:  HOME/SELF CARE  Per UR Regulation:  Reviewed for med. necessity/level of care/duration of stay  If discussed at [place] of Stay Meetings, dates discussed:    Comments:  Ilanna Deihl RN, BSN, MSHL, CCM  Nurse - Case Manager, ([facility] [place])  [phone]  02/22/2014 PT Recs:  [facility] PT  (Patient only has MCD/no option for PT services) CHF Team:  Active Meds:  [place] active Disposition:  Home / Self Care.   Shishir Krantz RN, BSN, MSHL, CCM  Nurse - Case Manager, ([facility] [place])  [phone]  02/22/2014 IM to patient, signed; copy to patient and in chart. Social:  From home with DTR - patient is separated from husband Meds:  takes meds from Med bottles.  States associated reason to admission d/t ran out of Lasix and called pharmacy for refill but was instructed pharmacy would need to contact MD for authorization; patient states she never heard back from pharmacy [facility].   [facility] active PCP:  Leone Haven, MD  [phone]; [phone] Home DME:  none CM provided Verbal education on option of [facility] to asssit with earlier intervention for refills. Disposition Plan in progress and pending.

## 2014-02-22 NOTE — Progress Notes (Signed)
UR completed Mallika Sanmiguel K. Nachman Sundt, RN, BSN, [place], CCM  02/22/2014 10:57 AM

## 2014-02-22 NOTE — Progress Notes (Signed)
Family Medicine Teaching Service Daily Progress Note Intern Pager: [phone]  Patient name: Hailey Barnes Medical record number: [id number] Date of birth: [birth year] Age: 53 y.o. Gender: female  Primary Care Provider: Tommi Rumps, MD Consultants: none Code Status: full  Pt Overview and Major Events to Date:   Assessment and Plan: Hailey Barnes is a 53 y.o. female presenting with worsening SOB, wheezing in last 2-3 days. PMH is significant for HTN, tobacco use, NASH, diastolic dysfunction   #CV: hx of HTN/CHF exacerbation; assc SOB likely related to recent poor medication compliance in addition to HTN urgency. S/p IV lasix 40 in ED 2 days ago and 63m IM and again 810mIV since admission. However cannot exclude PE at this time given dyspnea, tachypnea and new O2 requirement (wells score 3 if consider PE likely diagnosis). Will add on d-dimer this morning. Cont with diuresis as kidneys allow. Recent ECHO in 11/2013 showing grade ii diastolic dysfunction, EF 6572-82%ill hold on repeat ECHO. trops neg X2 thus far, 12lead EKG largely unchanged -s/p 8029mv lasix in last 24 hrs, will give another 40iv lasix this pm -monitor Cr closely given above (1.15)  -aldactone 26m21mwill add back metoprolol at 1/2 dose (25BID) -strict i/os (net neg 2.37L) -daily weights (down 3 lbs since yesterday) -vitals per floor protocol   -cycle trops  - will treat HTN with hydralazine IV prn.  - likely need to be started on additional agent prior to discharge, will be cautious with renal fxn (unclear what her home regimen truly is given many recent changes)  # Renal: h/o renal insufficiency in setting of increased diuresis. Recent Cr of 1.23 (1.15 this morning) - check BMPs serially - monitor Cr in setting of diuresis.   #Pulm: COPD; tobacco abuse; constant chest tightness, current every day smoker; does not appear to be in COPD exacerbation at this time; feel cough is 2/2 cardiac etiology vs  intrinsic pulm  -albuterol prn (home medication)  -IS  -encourage smoking cessation  -nicotine patch as needed  -could consider tessalon pearls for cough if giving significant distress   #Neuro: constant headache, no visual abnormalities, grossly nml neuro exam; likely related to HTN urgency; no complaints of this morning -will monitor for visual changes  -hold on CT head at this time   #MSK- c/o Lumbar spine pain in office with no saddle anesthesia or red flags; failed outpt treatment with exercises and ibuprofen  -no complaint of currently  -will hold on imaging at this time  -stop ibuprofen in setting of heart failure   #FEN/GI: Hx of NASH, obesity ; ast/alt 15/12 -HHD  -encourage lifestyle modification  Prophylaxis: hsq  Disposition: further w/up of dyspnea; diuresis   Subjective: Feels more SOB than she did last night; has O2 requirement (is not on at home)  Objective: Temp:  [97.6 F (36.4 C)-98 F (36.7 C)] 97.6 F (36.4 C) (05/06 0625) Pulse Rate:  [75-87] 75 (05/06 0625) Resp:  [18-20] 18 (05/06 0625) BP: (156-194)/(92-120) 175/104 mmHg (05/06 0625) SpO2:  [96 %-99 %] 96 % (05/06 0625) FiO2 (%):  [2 %] 2 % (05/06 0101) Weight:  [265 lb 6.9 oz (120.4 kg)-272 lb (123.378 kg)] 265 lb 6.9 oz (120.4 kg) (05/06 06250601ysical Exam: eneral: NAD, lying in bed coughing, hoarse voice  HEENT: NCAT, dry MM, PERRL, EOMI  Cardiovascular: RRR, nml s1/2 no murmurs, rubs or gallops, JVD minimally appreciated  Respiratory: CTAB, no wheezes rhonchi or crackles  Abdomen: obese,  soft, NTND, no palpable fluid wave  Extremities: WWP; 3+ pitting edema bilaterally  Skin: no rashes or lesions; tattoos  Neuro: A&O; alert and talkative; grossly intact   Laboratory:  Recent Labs Lab 02/20/14 0110 02/21/14 2056  WBC 10.6* 10.5  HGB 11.3* 11.9*  HCT 35.9* 37.2  PLT 254 266    Recent Labs Lab 02/21/14 1421 02/21/14 2056 02/22/14 0436  NA 137 138 138  K 4.2 3.5* 3.5*  CL  105 102 101  CO2 29 25 28   BUN 11 15 14   CREATININE 1.15* 1.22* 1.15*  CALCIUM 9.1 8.8 8.7  PROT  --  6.7  --   BILITOT  --  <0.2*  --   ALKPHOS  --  94  --   ALT  --  12  --   AST  --  15  --   GLUCOSE 86 104* 106*      Recent Labs Lab 02/21/14 2056 02/22/14 0436  TROPONINI <0.30 <0.30     Imaging/Diagnostic Tests: IMPRESSION:  No acute infiltrate or pulmonary edema. Central mild bronchitic  changes   Langston Masker, MD 02/22/2014, 7:40 AM PGY-1, [place] Intern pager: [phone], text pages welcome

## 2014-02-22 NOTE — Progress Notes (Signed)
FMTS Attending Note  I personally saw and evaluated the patient. The plan of care was discussed with the resident team. I agree with the assessment and plan as documented by the resident. Patient seen at 36.  52 y/o female admitted with increasing dyspnea related to likely CHF exacerbation and accelerated HTN. Patient remains subjectively sob despite diuresis overnight. No current chest pain. She reports headaches, blurry vision and subjective left leg weakness over the past few days. Frontal headache since last evening.   Neuro exam: CN2-12 intact, strength 5/5 in all extremities, sensation to light touch intact in all extremities, pronator drift negative, rhomberg positive for imbalance, able to perform bilateral finger to nose, biceps/patellar/achilles reflexes 2+ bilaterally Resp: faint crackles at bases, no wheezes  Will check stat CT head as symptoms could represent acute stroke from accelerated HTN. Blood pressure is under better control this afternooon (148/48) on metoprolol 25 BID (1/2 home dose) and aldactone 25 mg daily. D-dimer negative suggesting against PE as the cause of her dyspnea. F/U echo to evaluate heart function.   Dossie Arbour MD

## 2014-02-22 NOTE — Progress Notes (Signed)
Family Practice Teaching Service Interval Progress Note  Stopped by pt's room to check on her. She reports that her headache went away around 5pm. She's generally feeling much better than she was upon admission. Still c/o left leg weakness which started a few days ago.   Neuro exam nonfocal except for slight weakness in L leg. No cranial nerve deficits. Speech fluent. Strength 5/5 in all other extremities.  CT head from earlier today negative for any acute process. Will continue to monitor patient. Will discuss with team in AM the utility of getting MRI brain to r/o ischemic event in light of her left leg weakness.  Chrisandra Netters, MD Family Medicine PGY-2 Service Pager [phone]

## 2014-02-23 LAB — CBC
HCT: 39.6 % (ref 36.0–46.0)
Hemoglobin: 12.5 g/dL (ref 12.0–15.0)
MCH: 23.5 pg — ABNORMAL LOW (ref 26.0–34.0)
MCHC: 31.6 g/dL (ref 30.0–36.0)
MCV: 74.4 fL — ABNORMAL LOW (ref 78.0–100.0)
PLATELETS: 253 10*3/uL (ref 150–400)
RBC: 5.32 MIL/uL — ABNORMAL HIGH (ref 3.87–5.11)
RDW: 18.5 % — AB (ref 11.5–15.5)
WBC: 7.2 10*3/uL (ref 4.0–10.5)

## 2014-02-23 LAB — BASIC METABOLIC PANEL
BUN: 15 mg/dL (ref 6–23)
CALCIUM: 9.1 mg/dL (ref 8.4–10.5)
CO2: 24 meq/L (ref 19–32)
CREATININE: 1.07 mg/dL (ref 0.50–1.10)
Chloride: 99 mEq/L (ref 96–112)
GFR calc Af Amer: 69 mL/min — ABNORMAL LOW (ref >90)
GFR, EST NON AFRICAN AMERICAN: 59 mL/min — AB (ref >90)
Glucose, Bld: 153 mg/dL — ABNORMAL HIGH (ref 70–99)
Potassium: 3.8 mEq/L (ref 3.7–5.3)
Sodium: 135 mEq/L — ABNORMAL LOW (ref 137–147)

## 2014-02-23 LAB — GLUCOSE, CAPILLARY: Glucose-Capillary: 169 mg/dL — ABNORMAL HIGH (ref 70–99)

## 2014-02-23 MED ORDER — LISINOPRIL 20 MG PO TABS
20.0000 mg | ORAL_TABLET | Freq: Every day | ORAL | Status: DC
  Administered 2014-02-23 – 2014-02-24 (×2): 20 mg via ORAL
  Filled 2014-02-23 (×2): qty 1

## 2014-02-23 MED ORDER — FUROSEMIDE 10 MG/ML IJ SOLN
40.0000 mg | INTRAMUSCULAR | Status: AC
  Administered 2014-02-23: 40 mg via INTRAVENOUS
  Filled 2014-02-23: qty 4

## 2014-02-23 MED ORDER — GUAIFENESIN ER 600 MG PO TB12: 1200.0000 mg | ORAL_TABLET | Freq: Two times a day (BID) | ORAL | Status: DC

## 2014-02-23 MED ORDER — METOPROLOL TARTRATE 50 MG PO TABS
50.0000 mg | ORAL_TABLET | Freq: Two times a day (BID) | ORAL | Status: DC
  Administered 2014-02-23 – 2014-02-24 (×3): 50 mg via ORAL
  Filled 2014-02-23 (×4): qty 1

## 2014-02-23 MED ORDER — GUAIFENESIN ER 600 MG PO TB12
1200.0000 mg | ORAL_TABLET | Freq: Two times a day (BID) | ORAL | Status: DC | PRN
  Administered 2014-02-23 (×2): 1200 mg via ORAL
  Filled 2014-02-23 (×4): qty 2

## 2014-02-23 MED ORDER — PREDNISONE 50 MG PO TABS
50.0000 mg | ORAL_TABLET | Freq: Every day | ORAL | Status: DC
  Administered 2014-02-23 – 2014-02-24 (×2): 50 mg via ORAL
  Filled 2014-02-23 (×3): qty 1

## 2014-02-23 MED ORDER — PREDNISONE 10 MG PO TABS: 10.0000 mg | ORAL_TABLET | Freq: Every day | ORAL | Status: DC

## 2014-02-23 MED ORDER — TRAMADOL HCL 50 MG PO TABS
50.0000 mg | ORAL_TABLET | Freq: Four times a day (QID) | ORAL | Status: DC | PRN
  Administered 2014-02-23 – 2014-02-24 (×3): 50 mg via ORAL
  Filled 2014-02-23 (×3): qty 1

## 2014-02-23 NOTE — Progress Notes (Signed)
Paged family Medicine oncall doctor and notified them that patient is extremely congested tonight. Orders given for Mucinex. Will administer as ordered and continue to monitor patient to end of shift.

## 2014-02-23 NOTE — Progress Notes (Signed)
FMTS Attending Note  I personally saw and evaluated the patient. The plan of care was discussed with the resident team. I agree with the assessment and plan as documented by the resident.   Dyspnea - suspect due to diastolic heart failure exacerbation, mild improvement with diuresis, patient wheezing on exam this AM, she does have a 30 year history of tobacco use with no previously diagnosed COPD, will add PO prednisone and breathing treatments to cover possible COPD exacerbation Accelerated HTN - blood pressure under better control today, Metoprolol increased to 50 mg BID and on Aldactone 25 mg daily, continue to monitor Left leg weakness - CT head on 02/22/14 negative, MRI brain pending to rule out acute stroke, patient does have a 10 year history of chronic low back/hip pain however denies a history of weakness, if MRI negative consider outpatient MRI lumber spine, no acute saddle anesthesia or or bladder/bowel retention/incontinence.   Dossie Arbour MD

## 2014-02-23 NOTE — Progress Notes (Signed)
Family Medicine Teaching Service Daily Progress Note Intern Pager: [phone]  Patient name: Hailey Barnes Medical record number: [id number] Date of birth: February 02, 1961 Age: 53 y.o. Gender: female  Primary Care Provider: Tommi Rumps, MD Consultants: none Code Status: full  Pt Overview and Major Events to Date:   Assessment and Plan: Hailey Barnes is a 53 y.o. female presenting with worsening SOB, wheezing in last 2-3 days. PMH is significant for HTN, tobacco use, NASH, diastolic dysfunction   #CV: hx of HTN/acute diastolic CHF exacerbation; assc SOB likely related to recent poor medication compliance in addition to HTN urgency. D-dimer negative. Cont with diuresis as kidneys allow. Recent ECHO in 11/2013 showing grade ii diastolic dysfunction, EF 26-41% will hold on repeat ECHO. -cont diuresis with 106m IV lasix now -monitor Cr closely given above  -aldactone 264m -metoprolol 2559mID, hydralazine prn -strict i/os (net neg 2.18L) -daily weights (down 4 lbs since yesterday) -vitals per floor protocol    - likely need to be started on additional agent prior to discharge, will be cautious with renal fxn (unclear what her home regimen truly is given many recent changes)  #Neuro: constant headache, no visual abnormalities but left sided weakness; Normal CT head. likely related to HTN urgency however would not explain neuro deficit. Possible peripheral impingmenet given previous complaints of lumbar spine pain vs intracranial process. Plan for MRI head -will monitor for visual changes  -MRI w/wo brain -tramadol 50q6 -tylenol prn  # Renal: h/o renal insufficiency in setting of increased diuresis. Recent Cr of 1.23  - check BMPs serially - monitor Cr in setting of diuresis.   #Pulm: COPD; tobacco abuse; constant chest tightness, current every day smoker; does not appear to be in COPD exacerbation at this time; feel cough is 2/2 cardiac etiology vs intrinsic pulm   -albuterol prn (home medication)  -IS  -encourage smoking cessation  -nicotine patch as needed  -tessalon pearls, mucinex   #MSK- c/o Lumbar spine pain in office with no saddle anesthesia or red flags; failed outpt treatment with exercises and ibuprofen  -no complaint of currently other than leg weakness  -will hold on imaging at this time  -stop ibuprofen in setting of heart failure   #FEN/GI: Hx of NASH, morbid obesity ; ast/alt 15/12 -HHD  -encourage lifestyle modification -daily weights  Prophylaxis: hsq  Disposition: further w/up of dyspnea; diuresis   Subjective: Each subsequent day feels worse than day prior; now having difficulty weight bearing on left side 2/2 weakness   Objective: Temp:  [97.4 F (36.3 C)-98.6 F (37 C)] 97.4 F (36.3 C) (05/07 0440) Pulse Rate:  [71-94] 82 (05/07 0440) Resp:  [20-23] 20 (05/07 0440) BP: (136-180)/(48-107) 162/107 mmHg (05/07 0440) SpO2:  [96 %-99 %] 99 % (05/07 0440) Weight:  [264 lb 8 oz (119.976 kg)] 264 lb 8 oz (119.976 kg) (05/07 0440) Physical Exam: eneral: NAD, lying in bed coughing, hoarse voice  HEENT: NCAT, dry MM, PERRL, EOMI  Cardiovascular: RRR, nml s1/2 no murmurs, rubs or gallops, JVD minimally appreciated  Respiratory: CTAB, no wheezes rhonchi or crackles  Abdomen: obese, soft, NTND, no palpable fluid wave  Extremities: WWP; 3+ pitting edema bilaterally  Skin: no rashes or lesions; tattoos  Neuro: A&O; alert and talkative, CNii-xii intact without deficit; strength in bilat upper extremities 5/5; left lower extremity hip flexion, hamstring, plantar flexion and dorsiflexion 4/5; right 5/5 throughout; no clonus; reflexes 2+  Laboratory:  Recent Labs Lab 02/20/14 0110 02/21/14 2056  WBC 10.6* 10.5  HGB 11.3* 11.9*  HCT 35.9* 37.2  PLT 254 266    Recent Labs Lab 02/21/14 1421 02/21/14 2056 02/22/14 0436  NA 137 138 138  K 4.2 3.5* 3.5*  CL 105 102 101  CO2 29 25 28   BUN 11 15 14   CREATININE 1.15*  1.22* 1.15*  CALCIUM 9.1 8.8 8.7  PROT  --  6.7  --   BILITOT  --  <0.2*  --   ALKPHOS  --  94  --   ALT  --  12  --   AST  --  15  --   GLUCOSE 86 104* 106*       Recent Labs Lab 02/21/14 2056 02/22/14 0436  TROPONINI <0.30 <0.30     Imaging/Diagnostic Tests: IMPRESSION:  No acute infiltrate or pulmonary edema. Central mild bronchitic  changes   Langston Masker, MD 02/23/2014, 9:24 AM PGY-1, [place] Intern pager: [phone], text pages welcome

## 2014-02-23 NOTE — Evaluation (Signed)
Physical Therapy Evaluation Patient Details Name: Hailey Barnes MRN: [id number] DOB: Jun 12, 1961 Today's Date: 02/23/2014   History of Present Illness  Adm 02/21/14 with SOB (CHF vs COPD exacerbation) and hypertensive urgency. Pt has had 5+L of fluid removed. Reported LLE weakness (chronic but worsened since admission). MRI head pending. 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 Full balance assessment could not be completed this date due to elevated BP prior to activity (194/106). Pt does have significant LLE weakness that puts her at increased risk to fall. Pt will benefit from skilled PT to increase their independence and safety with mobility to allow discharge home with only intermittent supervision. Pt currently with functional limitations due to the deficits listed below (see PT Problem List).       Follow Up Recommendations Home health PT;Supervision for mobility/OOB    Equipment Recommendations  Rolling walker with 5" wheels    Recommendations for Other Services       Precautions / Restrictions Precautions Precautions: Fall Precaution Comments: pt reports she has fallen previously due to LLE buckling, however last fall was ~18 months ago      Mobility  Bed Mobility Overal bed mobility: Modified Independent                Transfers Overall transfer level: Needs assistance Equipment used: Rolling walker (2 wheeled);None Transfers: Sit to/from Stand Sit to Stand: Min guard         General transfer comment: minguard due to reported dizziness; vc for safe use of RW   Ambulation/Gait Ambulation/Gait assistance: Min assist;Min guard Ambulation Distance (Feet): 30 Feet (10, 20 (to/from BR)) Assistive device: Rolling walker (2 wheeled);1 person hand held assist Gait Pattern/deviations: Step-through pattern;Decreased stride length   Gait  velocity interpretation: Below normal speed for age/gender General Gait Details: feels unsteady due to LLE incr weakness; agreed to use of RW on return to Physicist, medical    Modified Rankin (Stroke Patients Only)       Balance Overall balance assessment: No apparent balance deficits (not formally assessed) (BP too high for further testing)                                           Pertinent Vitals/Pain See vitals flow sheet.     Home Living Family/patient expects to be discharged to:: Private residence Living Arrangements: Children;Other relatives Available Help at Discharge: Family;Available PRN/intermittently Type of Home: Apartment Home Access: Stairs to enter Entrance Stairs-Rails: Psychiatric nurse of Steps: 12 Home Layout: One level Home Equipment: None Additional Comments: daughter works days    Prior Function Level of Independence: Independent         Comments: limited by dyspnea, leg weakness, and back pain PTA     Hand Dominance        Extremity/Trunk Assessment   Upper Extremity Assessment: Overall WFL for tasks assessed           Lower Extremity Assessment: LLE deficits/detail   LLE Deficits / Details: AROM WFL; knee extension 3+, knee flexion 3+, dorsiflexion 4/5 (all tested in sitting); reports h/o numbness but reports light touch intact with testing today  Cervical / Trunk Assessment: Normal  Communication   Communication: No  difficulties  Cognition Arousal/Alertness: Awake/alert Behavior During Therapy: WFL for tasks assessed/performed Overall Cognitive Status: Within Functional Limits for tasks assessed                      General Comments General comments (skin integrity, edema, etc.): On arrival, BP assessed at EOB 194/106. RN notified. Pt eager to go to the bathroom and assisted short distance to BR. On return to recliner, BP 187/103. RN in to provide  BP meds via IV.     Exercises        Assessment/Plan    PT Assessment Patient needs continued PT services  PT Diagnosis Difficulty walking   PT Problem List Decreased strength;Decreased activity tolerance;Decreased balance;Decreased mobility;Decreased knowledge of use of DME;Impaired sensation;Pain  PT Treatment Interventions DME instruction;Gait training;Stair training;Functional mobility training;Therapeutic activities;Patient/family education   PT Goals (Current goals can be found in the Care Plan section) Acute Rehab PT Goals Patient Stated Goal: improve strength LLE PT Goal Formulation: With patient Time For Goal Achievement: 03/01/14 Potential to Achieve Goals: Good    Frequency Min 3X/week   Barriers to discharge Inaccessible home environment lives in second floor apartment; 12 steps to enter    Co-evaluation               End of Session   Activity Tolerance: Treatment limited secondary to medical complications (Comment) (elevated BP) Patient left: in chair;with call bell/phone within reach Nurse Communication: Mobility status;Other (comment) (BP elevated)         Time: 1194-1740 PT Time Calculation (min): 24 min   Charges:   PT Evaluation $Initial PT Evaluation Tier I: 1 Procedure PT Treatments $Gait Training: 8-22 mins   PT G CodesJeanie Cooks Glendal Cassaday 02-28-2014, 3:47 PM Pager [phone]

## 2014-02-23 NOTE — Clinical Documentation Improvement (Signed)
1. Please clarify type of CHF. Thank you. Possible Clinical Conditions?  Acute Systolic Congestive Heart Failure Acute Diastolic Congestive Heart Failure Acute Systolic & Diastolic Congestive Heart Failure Acute on Chronic Systolic Congestive Heart Failure Acute on Chronic Diastolic Congestive Heart Failure Acute on Chronic Systolic & Diastolic Congestive Heart Failure Other Condition________________________________________ Cannot Clinically Determine  Supporting Information:  Risk Factors: Admitted with CHF exacerbation History of CHF, HTN, COPD & Obesity  Signs & Symptoms: 3+ pitting edema Required Oxygen Shortness of breath especially on exertion Wheezing  Diagnostics: BNP = 162.5 Last Echo: EF 65-70% EKG  Treatment: Daily weights Strict I&O Addition 14m IV Lasix x 1 IV saline locked  2. Please clarify Obesity. Thank You. Possible Clinical conditions  Morbid Obesity W/ BMI 47.52  Other condition___________________  Cannot clinically determine _____________  Risk Factors: History of CHF, Obesity & COPD  Sign & Symptoms: Ht. 5'3"   Wt. 264 lbs    BMI 47.52  Treatment Heart diet Daily weights  Thank You, DEstella Husk,RN Clinical Documentation Specialist:  [employer]

## 2014-02-24 ENCOUNTER — Inpatient Hospital Stay (HOSPITAL_COMMUNITY): Payer: Medicaid Other

## 2014-02-24 LAB — BASIC METABOLIC PANEL
BUN: 16 mg/dL (ref 6–23)
CO2: 25 mEq/L (ref 19–32)
CREATININE: 1.11 mg/dL — AB (ref 0.50–1.10)
Calcium: 8.7 mg/dL (ref 8.4–10.5)
Chloride: 98 mEq/L (ref 96–112)
GFR calc non Af Amer: 57 mL/min — ABNORMAL LOW (ref >90)
GFR, EST AFRICAN AMERICAN: 66 mL/min — AB (ref >90)
Glucose, Bld: 141 mg/dL — ABNORMAL HIGH (ref 70–99)
Potassium: 3.7 mEq/L (ref 3.7–5.3)
Sodium: 135 mEq/L — ABNORMAL LOW (ref 137–147)

## 2014-02-24 LAB — CBC
HCT: 38.8 % (ref 36.0–46.0)
Hemoglobin: 12.3 g/dL (ref 12.0–15.0)
MCH: 23.6 pg — ABNORMAL LOW (ref 26.0–34.0)
MCHC: 31.7 g/dL (ref 30.0–36.0)
MCV: 74.5 fL — ABNORMAL LOW (ref 78.0–100.0)
Platelets: 262 10*3/uL (ref 150–400)
RBC: 5.21 MIL/uL — ABNORMAL HIGH (ref 3.87–5.11)
RDW: 18.4 % — AB (ref 11.5–15.5)
WBC: 11.5 10*3/uL — ABNORMAL HIGH (ref 4.0–10.5)

## 2014-02-24 MED ORDER — SPIRONOLACTONE 25 MG PO TABS: 25.0000 mg | ORAL_TABLET | Freq: Every day | ORAL | Status: DC

## 2014-02-24 MED ORDER — LISINOPRIL 20 MG PO TABS: 20.0000 mg | ORAL_TABLET | Freq: Every day | ORAL | Status: DC

## 2014-02-24 MED ORDER — GADOBENATE DIMEGLUMINE 529 MG/ML IV SOLN
20.0000 mL | Freq: Once | INTRAVENOUS | Status: AC | PRN
  Administered 2014-02-24: 20 mL via INTRAVENOUS

## 2014-02-24 MED ORDER — FUROSEMIDE 40 MG PO TABS
40.0000 mg | ORAL_TABLET | Freq: Every day | ORAL | Status: DC
  Administered 2014-02-24: 40 mg via ORAL
  Filled 2014-02-24: qty 1

## 2014-02-24 MED ORDER — FUROSEMIDE 40 MG PO TABS
40.0000 mg | ORAL_TABLET | Freq: Every day | ORAL | Status: DC
Start: 2014-02-24 — End: 2014-05-02

## 2014-02-24 MED ORDER — HYDROXYZINE HCL 50 MG PO TABS
50.0000 mg | ORAL_TABLET | Freq: Once | ORAL | Status: AC
  Administered 2014-02-24: 50 mg via ORAL
  Filled 2014-02-24: qty 1

## 2014-02-24 MED ORDER — TIOTROPIUM BROMIDE MONOHYDRATE 18 MCG IN CAPS
18.0000 ug | ORAL_CAPSULE | Freq: Every day | RESPIRATORY_TRACT | Status: DC
  Filled 2014-02-24: qty 5

## 2014-02-24 MED ORDER — NICOTINE 14 MG/24HR TD PT24: 14.0000 mg | MEDICATED_PATCH | Freq: Every day | TRANSDERMAL | Status: DC

## 2014-02-24 MED ORDER — ALBUTEROL SULFATE HFA 108 (90 BASE) MCG/ACT IN AERS: 2.0000 | INHALATION_SPRAY | Freq: Four times a day (QID) | RESPIRATORY_TRACT | Status: DC | PRN

## 2014-02-24 MED ORDER — PREDNISONE 50 MG PO TABS: ORAL_TABLET | ORAL | Status: DC

## 2014-02-24 MED ORDER — GUAIFENESIN ER 600 MG PO TB12: 1200.0000 mg | ORAL_TABLET | Freq: Two times a day (BID) | ORAL | Status: DC | PRN

## 2014-02-24 MED ORDER — TIOTROPIUM BROMIDE MONOHYDRATE 18 MCG IN CAPS: 18.0000 ug | ORAL_CAPSULE | Freq: Every day | RESPIRATORY_TRACT | Status: DC

## 2014-02-24 NOTE — Progress Notes (Signed)
Family Medicine Teaching Service Daily Progress Note Intern Pager: [phone]  Patient name: Hailey Barnes Medical record number: [id number] Date of birth: May 23, 1961 Age: 53 y.o. Gender: female  Primary Care Provider: Tommi Rumps, MD Consultants: none Code Status: full  Pt Overview and Major Events to Date:   Assessment and Plan: Hailey Barnes is a 53 y.o. female presenting with worsening SOB, wheezing in last 2-3 days. PMH is significant for HTN, tobacco use, NASH, diastolic dysfunction   #CV: hx of HTN/acute diastolic CHF exacerbation; assc SOB likely related to recent poor medication compliance in addition to HTN urgency. D-dimer negative. Cont with diuresis as kidneys allow. Recent ECHO in 11/2013 showing grade ii diastolic dysfunction, EF 09-23% will hold on repeat ECHO. -cont diuresis with 41m PO lasix -monitor Cr closely given above  -aldactone 273m -metoprolol 504mID, added lisinopril 76m48msterday, hydralazine prn  -strict i/os (net neg 5.6L total) -daily weights (unchanged since yesterday) -vitals per floor protocol     #Neuro: constant headache, no visual abnormalities but left sided weakness; Normal CT head. likely related to HTN urgency however would not explain neuro deficit. Possible peripheral impingmenet given previous complaints of lumbar spine pain vs intracranial process. Plan for MRI head, hopefully completed today -will monitor for visual changes  -MRI w/wo brain -tramadol 50q6 -tylenol prn  # Renal: h/o renal insufficiency in setting of increased diuresis. Recent Cr 1.11  - check BMPs serially - monitor Cr in setting of diuresis.   #Pulm: COPD; tobacco abuse; constant chest tightness, current every day smoker; does not appear to be in COPD exacerbation at this time; feel cough is 2/2 cardiac etiology vs intrinsic pulm  -albuterol prn (home medication)  -IS  -encourage smoking cessation  -nicotine patch as needed  -tessalon pearls,  mucinex  -adding spiriva  #MSK- c/o Lumbar spine pain in office with no saddle anesthesia or red flags; failed outpt treatment with exercises and ibuprofen  -no complaint of currently other than leg weakness  -will hold on imaging at this time  -stop ibuprofen in setting of heart failure   #FEN/GI: Hx of NASH, morbid obesity ; ast/alt 15/12 -HHD  -encourage lifestyle modification -daily weights  Prophylaxis: hsq  Disposition: further w/up of dyspnea; diuresis   Subjective:  No headache this morning, but still with chronic cough  Objective: Temp:  [97.5 F (36.4 C)-98.4 F (36.9 C)] 98.4 F (36.9 C) (05/08 0555) Pulse Rate:  [69-85] 69 (05/08 0555) Resp:  [20-21] 21 (05/08 0555) BP: (120-194)/(61-106) 120/61 mmHg (05/08 0555) SpO2:  [95 %-100 %] 95 % (05/08 0555) Weight:  [264 lb 5.3 oz (119.9 kg)] 264 lb 5.3 oz (119.9 kg) (05/08 0555) Physical Exam: eneral: NAD,sitting up in bed coughing, hoarse voice  HEENT: NCAT, dry MM, PERRL, EOMI  Cardiovascular: RRR, nml s1/2 no murmurs, rubs or gallops, JVD minimally appreciated  Respiratory: CTAB, bibasilar wheezes no rhonchi or crackles  Abdomen: obese, soft, NTND, no palpable fluid wave  Extremities: WWP; 3+ pitting edema bilaterally  Skin: no rashes or lesions; tattoos  Neuro: A&O; alert and talkative Laboratory:  Recent Labs Lab 02/21/14 2056 02/23/14 0920 02/24/14 0802  WBC 10.5 7.2 11.5*  HGB 11.9* 12.5 12.3  HCT 37.2 39.6 38.8  PLT 266 253 262    Recent Labs Lab 02/21/14 2056 02/22/14 0436 02/23/14 0920 02/24/14 0802  NA 138 138 135* 135*  K 3.5* 3.5* 3.8 3.7  CL 102 101 99 98  CO2 25 28 24  25  BUN 15 14 15 16   CREATININE 1.22* 1.15* 1.07 1.11*  CALCIUM 8.8 8.7 9.1 8.7  PROT 6.7  --   --   --   BILITOT <0.2*  --   --   --   ALKPHOS 94  --   --   --   ALT 12  --   --   --   AST 15  --   --   --   GLUCOSE 104* 106* 153* 141*       Recent Labs Lab 02/21/14 2056 02/22/14 0436  TROPONINI <0.30  <0.30     Imaging/Diagnostic Tests: IMPRESSION:  No acute infiltrate or pulmonary edema. Central mild bronchitic  changes   Langston Masker, MD 02/24/2014, 9:40 AM PGY-1, [place] Intern pager: [phone], text pages welcome

## 2014-02-24 NOTE — Plan of Care (Signed)
Problem: Phase II Progression Outcomes Goal: Pain controlled Outcome: Not Progressing Patient continues to require pain medication for headaches. Pt is supposed to go for an MRI in hopes for a rationale for headaches for CT scan was negative.

## 2014-02-24 NOTE — Discharge Summary (Signed)
[facility] Discharge Summary  Patient name: Hailey Barnes Medical record number: [id number] Date of birth: 18-Feb-1961 Age: 53 y.o. Gender: female Date of Admission: 02/21/2014  Date of Discharge: 02/24/2014 Admitting Physician: Lupita Dawn, MD  Primary Care Provider: Tommi Rumps, MD Consultants: none  Indication for Hospitalization: dyspnea, cough  Discharge Diagnoses/Problem List:  -acute diastolic chf exacerbation -HTN -NASH -morbid obesity -left leg weakness -current smoker -AKI -headache, tension  Disposition: home  Discharge Condition: improved  Discharge Exam:  BP 149/85  Pulse 78  Temp(Src) 98.6 F (37 C) (Oral)  Resp 21  Ht 5' 3"  (1.6 m)  Wt 264 lb 5.3 oz (119.9 kg)  BMI 46.84 kg/m2  SpO2 94%  LMP 01/28/2014 General: NAD,sitting up in bed coughing, hoarse voice  HEENT: NCAT, dry MM, PERRL, EOMI  Cardiovascular: RRR, nml s1/2 no murmurs, rubs or gallops, JVD minimally appreciated  Respiratory: CTAB, bibasilar wheezes no rhonchi or crackles  Abdomen: obese, soft, NTND, no palpable fluid wave  Extremities: WWP; 1-2+ pitting edema bilaterally  Skin: no rashes or lesions; tattoos  Neuro: A&O; alert and talkative, no focal deficits  Brief Hospital Course:  Hailey Barnes is a 53 y.o. female presenting with worsening SOB, wheezing in last 2-3 days. PMH is significant for HTN, tobacco use, NASH, diastolic dysfunction   #CV: Pt initially presenting with SOB along with hx of orthopnea, PND, and non productive cough. Had been out of spiro and lasix for the last week approx. Recently had gone to ED where was given 40IV lasix and clonidine with improvement (SBP at that time 200s). BNP of 128, neg trop and neg CXR. Labs notable for Cr of 1.23 (1.4 on 01/23/14). Hgb of 11.3. Initially felt better but then subsequently returned to [facility] with concerns of constant chest tightness, peripheral edema, and wt gain 4lbs in last 36hrs. Pt  admitted for diuresis, initially diuresed with IV lasix then transitioned to PO. D-dimer neg. Recent ECHo 11/2013 with grade II diastolic dysfxn EF 74-08. Trops neg. EKG unchanged. Was cont on home aldactone and metoprolol. Additionally added lisinopril 20. Wt down 4 lbs since admission and net neg 6L. Pt symptomatically much improved. Will certainly need close outpt follow up for better BP management. Recommend repeat BMET with addition of lisinopril.   #Neuro: Pt complaining of constant headache initially not assc with visual abnormalities but did note some new left sided weakness; Normal CT head. Felt it was likely related to HTN urgency. MRI head obtained, chronic microvasc changes. Will need very tight control of BP regimen.   # Renal: Pt with h/o renal insufficiency in setting of increased diuresis. Recent Cr of 1.23 as outpt. Remain < 1.2 throughout admission in setting of diuresis. Would monitor closely with start of lisinopril.   #Pulm: Pt with known COPD and tobacco abuse. On presentation was c/o constant chest tightness and chronic cough. Given nicotine patch, mucinex and tessalon pearls, albuterol prn. Started on spiriva. Recommend outpt f/up PFTs and Sleep study for eval sleep apnea. Ambulated with pulse ox, did not require O2  #MSK- initially c/o Lumbar spine pain in office with PCP but no signs of saddle anesthesia or red flag.Had failed outpt treatment with home exercises and ibuprofen. Pt did not mention during admission. Ibuprofen stopped in setting of acute CHF. No further imaging performed at this time. Would recommend consideration of lumbar MRI should sx reoccur.  #FEN/GI: Pt with hx of NASH, morbid obesity. Ast/alt 15/12 on admission.  Given HHD, encouraged lifestyle modification.   Issues for Follow Up:  1. Further titration of blood pressure medications: esp in setting of head MRI with chronic microvasc changes 2. Need for spinal MRI if pt has recurrence of weakness or worsening  lumbar spinal pain 3. Outpt PFTs 4. Outpt sleep study  Significant Procedures: none  Significant Labs and Imaging:   Recent Labs Lab 02/21/14 2056 02/23/14 0920 02/24/14 0802  WBC 10.5 7.2 11.5*  HGB 11.9* 12.5 12.3  HCT 37.2 39.6 38.8  PLT 266 253 262    Recent Labs Lab 02/21/14 1421 02/21/14 2056 02/22/14 0436 02/23/14 0920 02/24/14 0802  NA 137 138 138 135* 135*  K 4.2 3.5* 3.5* 3.8 3.7  CL 105 102 101 99 98  CO2 29 25 28 24 25   GLUCOSE 86 104* 106* 153* 141*  BUN 11 15 14 15 16   CREATININE 1.15* 1.22* 1.15* 1.07 1.11*  CALCIUM 9.1 8.8 8.7 9.1 8.7  ALKPHOS  --  94  --   --   --   AST  --  15  --   --   --   ALT  --  12  --   --   --   ALBUMIN  --  3.1*  --   --   --      Recent Labs Lab 02/21/14 2056 02/22/14 0436  TROPONINI <0.30 <0.30    CXR IMPRESSION:  No acute infiltrate or pulmonary edema. Central mild bronchitic  changes  CT head no intracranial abnormalities MRI chronic microvascular changes  Results/Tests Pending at Time of Discharge: none  Discharge Medications:    Medication List         albuterol 108 (90 BASE) MCG/ACT inhaler  Commonly known as:  PROVENTIL HFA;VENTOLIN HFA  Inhale 2 puffs into the lungs every 6 (six) hours as needed for wheezing or shortness of breath.     benzonatate 100 MG capsule  Commonly known as:  TESSALON PERLES  Take 1 capsule (100 mg total) by mouth 3 (three) times daily as needed for cough.     furosemide 40 MG tablet  Commonly known as:  LASIX  Take 1 tablet (40 mg total) by mouth daily.     guaiFENesin 600 MG 12 hr tablet  Commonly known as:  MUCINEX  Take 2 tablets (1,200 mg total) by mouth 2 (two) times daily as needed for cough or to loosen phlegm.     ibuprofen 200 MG tablet  Commonly known as:  ADVIL,MOTRIN  Take 400 mg by mouth every 6 (six) hours as needed (pain).     lisinopril 20 MG tablet  Commonly known as:  PRINIVIL,ZESTRIL  Take 1 tablet (20 mg total) by mouth daily.      metoprolol 50 MG tablet  Commonly known as:  LOPRESSOR  Take 1 tablet (50 mg total) by mouth 2 (two) times daily.     metroNIDAZOLE 500 MG tablet  Commonly known as:  FLAGYL  Take 1 tablet (500 mg total) by mouth 2 (two) times daily.     nicotine 14 mg/24hr patch  Commonly known as:  NICODERM CQ - dosed in mg/24 hours  Place 1 patch (14 mg total) onto the skin daily.     predniSONE 50 MG tablet  Commonly known as:  DELTASONE  Take one pill once a day for the next 5 days     spironolactone 25 MG tablet  Commonly known as:  ALDACTONE  Take 1 tablet (25 mg  total) by mouth daily.     tiotropium 18 MCG inhalation capsule  Commonly known as:  SPIRIVA  Place 1 capsule (18 mcg total) into inhaler and inhale daily.        Discharge Instructions: Please refer to Patient Instructions section of EMR for full details.  Patient was counseled important signs and symptoms that should prompt return to medical care, changes in medications, dietary instructions, activity restrictions, and follow up appointments.   Follow-Up Appointments: Follow-up Information   Follow up with Melrose Nakayama, MD On 03/03/2014. (@1 :36)    Specialty:  Family Medicine   Contact information:   [place] [id number] [phone]       Langston Masker, MD 02/24/2014, 3:24 PM PGY-1, [place]

## 2014-02-24 NOTE — Progress Notes (Signed)
All d/c instruction given to pt by charge Sherlie Ban, RN.  Pt d/c to home.  Karie Kirks, RN

## 2014-02-24 NOTE — Discharge Instructions (Signed)
Hailey Barnes, You were seen in the hospital for shortness of breath and bad headaches. We obtained imaging of your head as well as looked a markers for delayed blood flow to your heart. We were pleased to see that these tests were normal.   We gave you medicine to help get extra fluid off your lungs and improve your heart function. It will be important for you to continue to take the blood pressure medicines to keep your hypertension under control when you go home.  You should also follow up closely at the clinic to obtain PFTs (pulmonary function testing) as well as an outpatient sleep study for sleep apnea. Please continue the oral steroids for your breathing as well as the inhaler spiriva.

## 2014-02-24 NOTE — Progress Notes (Signed)
Physical Therapy Treatment Patient Details Name: Hailey Barnes MRN: [id number] DOB: 1961-01-30 Today's Date: 02/24/2014    History of Present Illness Adm 02/21/14 with SOB (CHF vs COPD exacerbation) and hypertensive urgency. Pt has had 5+L of fluid removed. Reported LLE weakness (chronic but worsened since admission). MRI head still pending. 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 Was unable to persuade pt to use a RW for safety, however she was agreeable to use cane and was instructed in safe/proper use (including on stairs). Cause of LLE weakness still unknown (MRI of head still pending), therefore have not yet initiated home exercise program of LLE until more is known. Unfortunately, pt does not have insurance and any follow-up therapy will be limited to none per pt.   Follow Up Recommendations  Home health PT;Supervision - Intermittent  (however pt indicates she cannot afford)     Equipment Recommendations  None recommended by PT (pt obtained cane)    Recommendations for Other Services       Precautions / Restrictions Precautions Precautions: Fall Precaution Comments: pt reports she has fallen previously due to LLE buckling, however last fall was ~18 months ago    Mobility  Bed Mobility Overal bed mobility: Modified Independent                Transfers Overall transfer level: Modified independent Equipment used: Straight cane Transfers: Sit to/from Stand Sit to Stand: Modified independent (Device/Increase time)         General transfer comment: denied dizziness; no unsteadiness; practice x 3  Ambulation/Gait Ambulation/Gait assistance: Supervision;Modified independent (Device/Increase time) Ambulation Distance (Feet): 120 Feet (120, seated rest, 90) Assistive device: Straight cane;None     Gait velocity interpretation: Below normal speed for  age/gender General Gait Details: pt admits she does not want to use RW due to "makes her look old" Attempted gait with no device and pt with signficant trendelenburg on Lt with Rt pelvis dropping. Discussed incr strain this puts on her back and pt agreed to try a cane. Pt initially required visual and verbal cues for proper sequencing. She preferred step-to pattern (moving cane, LLE, then RLE) vs step-through (moving cane and LLE simultaneously, then RLE). She progressed to modified independent with cane   Stairs Stairs: Yes Stairs assistance: Supervision Stair Management: Two rails;Step to pattern;Sideways;Forwards Number of Stairs: 7 General stair comments: pt ascended stairs with bil rails (reports she can reach both at her apartment) and descended sideways with bil UE support on single rail. Pt required education re: safest pattern with LLE weakness and was able to return demonstrate.  Wheelchair Mobility    Modified Rankin (Stroke Patients Only)       Balance                                    Cognition Arousal/Alertness: Awake/alert Behavior During Therapy: WFL for tasks assessed/performed Overall Cognitive Status: Within Functional Limits for tasks assessed                      Exercises      General Comments General comments (skin integrity, edema, etc.): Very motivated. Asking appropriate questions re: potential sources of LLE weakness (brain vs peripheral nerve). Discussed potential f/u PT needs once test results known, however pt reports no insurance and no money  for therapy.       Pertinent Vitals/Pain BP 158/86 prior to activity SaO2 95% on RA at rest, 92% on RA with activity    Home Living                      Prior Function            PT Goals (current goals can now be found in the care plan section) Acute Rehab PT Goals Patient Stated Goal: improve strength LLE PT Goal Formulation: With patient Time For Goal Achievement:  03/01/14 Potential to Achieve Goals: Good Progress towards PT goals: Goals met and updated - see care plan    Frequency  Min 3X/week    PT Plan Current plan remains appropriate    Co-evaluation             End of Session   Activity Tolerance: Patient tolerated treatment well Patient left: in chair;with call bell/phone within reach     Time: 0959-1036 PT Time Calculation (min): 37 min  Charges:  $Gait Training: 23-37 mins                    G CodesJeanie Cooks Lacheryl Niesen 03-23-2014, 10:53 AM Pager [phone]

## 2014-02-24 NOTE — Discharge Summary (Signed)
I agree with the discharge summary as documented.   Radhika Dershem MD  

## 2014-02-24 NOTE — Progress Notes (Signed)
FMTS Attending Note  I personally saw and evaluated the patient. The plan of care was discussed with the resident team. I agree with the assessment and plan as documented by the resident.   Dyspnea improved with diuresis and treatment for possible COPD exacerbation. Will complete 5 day course of PO prednisone. Spiriva added to home regimen today. Patient will need outpatient sleep study to evaluate for OSA and PFT's to evaluate for COPD.   MRI head negative for acute CVA. Patient currently has no headaches however does still have subjective weakness of the left leg, will need outpatient workup including possible MRI lumbar spine for further evaluation.   Accelerated HTN - blood pressure under better control, will discharge on Metoprolol 50 BID, Lisinopril 20 mg daily, and Aldactone 25 daily. May need titration as an outpatient.   Stable for discharge home today.   Dossie Arbour mD

## 2014-02-24 NOTE — Progress Notes (Signed)
SATURATION QUALIFICATIONS: (This note is used to comply with regulatory documentation for home oxygen)  Patient Saturations on Room Air at Rest = 97  Patient Saturations on Room Air while Ambulating = 90  Patient Saturations on Room Air while ambulating 94-95  Please briefly explain why patient needs home oxygen:

## 2014-02-24 NOTE — Progress Notes (Signed)
Patient requesting something to help her sleep. Paged Family Medicine and spoke to Dr. Awanda Mink. Orders given for Hydroxyzine one time dose. Will continue to monitor patient to end of shift.

## 2014-02-24 NOTE — Progress Notes (Signed)
Family Practice Teaching Service PCP Progress Note  Patient is planning for discharge this evening. States is feeling improved with her diuresis. Her blood pressure is much improved. Will continue to follow-up on these issues as an outpatient. I appreciate all the care the inpatient team has provided to [facility] during her hospital stay.  Tommi Rumps, MD Family Medicine PGY-2 Service Pager [phone]

## 2014-02-27 ENCOUNTER — Telehealth: Payer: Self-pay | Admitting: Family Medicine

## 2014-02-27 NOTE — Telephone Encounter (Signed)
Was prescribed Spriva in the hospital. It will cost her over $400 to get it filled. Is there something cheaper?

## 2014-03-02 NOTE — Telephone Encounter (Signed)
Atrovent may be a cheaper option, though all of these types of medications may be quite expensive. I will touch base with Dr Valentina Lucks regarding options for the patient and then will get back to her regarding these options.

## 2014-03-03 ENCOUNTER — Encounter: Payer: Self-pay | Admitting: Family Medicine

## 2014-03-03 ENCOUNTER — Ambulatory Visit (INDEPENDENT_AMBULATORY_CARE_PROVIDER_SITE_OTHER): Payer: No Typology Code available for payment source | Admitting: Family Medicine

## 2014-03-03 VITALS — BP 170/108 | HR 79 | Temp 97.8°F | Wt 268.0 lb

## 2014-03-03 DIAGNOSIS — I509 Heart failure, unspecified: Secondary | ICD-10-CM

## 2014-03-03 DIAGNOSIS — I10 Essential (primary) hypertension: Secondary | ICD-10-CM

## 2014-03-03 LAB — BASIC METABOLIC PANEL
BUN: 20 mg/dL (ref 6–23)
CO2: 28 mEq/L (ref 19–32)
Calcium: 9.7 mg/dL (ref 8.4–10.5)
Chloride: 100 mEq/L (ref 96–112)
Creat: 1.26 mg/dL — ABNORMAL HIGH (ref 0.50–1.10)
Glucose, Bld: 94 mg/dL (ref 70–99)
Potassium: 4.3 mEq/L (ref 3.5–5.3)
Sodium: 137 mEq/L (ref 135–145)

## 2014-03-03 MED ORDER — HYDRALAZINE HCL 25 MG PO TABS: 25.0000 mg | ORAL_TABLET | Freq: Three times a day (TID) | ORAL | Status: DC

## 2014-03-03 NOTE — Assessment & Plan Note (Signed)
A: Con't to have fluid overload. Breathing seems ok.  P: - Increase Lasix to 80 mg x3 days. (checking creat today) - Con't Aldactone - Will refer for sleep study - Make appt with Dr. Valentina Lucks for PFT - Elevate legs - Unsure if there is another inhaler that is covered by [employer]. For now, attempt to quit smoking and f/u next week with Dr. Valentina Lucks and/or Dr. Caryl Bis

## 2014-03-03 NOTE — Assessment & Plan Note (Signed)
A: BP still way above goal. Only on 2 medications. Reports CP with deep breaths, same as before. No new neurologic findings.  P: - Bmet today - Con't Lopressor, Lisinopril - Add hydralazine 51m - F/u next week

## 2014-03-03 NOTE — Patient Instructions (Signed)
We will check your labs today.  Increase Lasix to 17m for the next 3 days.  Add Hydralazine three times per day for your blood pressure. I sent this to [facility].  Make two appointments before you leave:  - One for pharmacy clinic for PFT, smoking and discuss new inhaler  - One with Dr. SCaryl Bisto recheck your blood pressure next week.  If anything changes over the weekend, please be seen.  Amber M. Hairford, M.D.

## 2014-03-03 NOTE — Telephone Encounter (Signed)
Patient saw Dr Sheral Apley today. Patient will need PFTs. Is currently scheduled to see Dr Valentina Lucks next week. Will await results of PFTs and then make treatment decision.

## 2014-03-03 NOTE — Progress Notes (Signed)
Patient ID: Hailey Barnes, female   DOB: 11-06-1960, 53 y.o.   MRN: [id number]    Subjective: HPI: Patient is a 53 y.o. female presenting to clinic today for hospital follow up. Admitted to the hospital from 5/5-5/8 for acute on chronic diastolic CHF exacerbation. She was also found to have uncontrolled hypertension.   1. Dyspnea- Diuresed in hospital. D/c home on Spiriva but did not get Rx because of price. Still on Lasix and Aldactone. Did increase Lasix to 63m for 2 days which helped some, but now back down to 490m Still taking Aldactone 2558maily. Still smoking daily.   - Recommended outpatient PFT   - Recommended outpatient sleep study  2. HTN- Added Lisinopril to regimen. On Metoprolol. BP 206/122 today.   - Recommended f/u BMET to monitor creat after adding ACE   - Recommended f/u BP readings  3. Weakness in left leg- Persistent. No falls. MRI did not show stroke. No new symptoms.  History Reviewed: Daily smoker.  ROS: Please see HPI above.  Objective: Office vital signs reviewed. BP 206/122  Pulse 79  Temp(Src) 97.8 F (36.6 C) (Oral)  Wt 268 lb (121.564 kg)  SpO2 93%  LMP 01/28/2014  Physical Examination:  General: Awake, alert. NAD HEENT: Atraumatic, normocephalic. MMM. Pulm: CTAB, no wheezes. Good air movement. No crackles at the bases Cardio: RRR, no murmurs appreciated Abdomen: Obese, soft, nontender, nondistended Extremities: 2+ edema to mid-shin Neuro: Grossly intact, normal gait  Assessment: 52 6o. female hospital follow up  Plan: See Problem List and After Visit Summary

## 2014-03-06 ENCOUNTER — Encounter: Payer: Self-pay | Admitting: Family Medicine

## 2014-03-09 ENCOUNTER — Encounter: Payer: Self-pay | Admitting: Pharmacist

## 2014-03-09 ENCOUNTER — Ambulatory Visit (INDEPENDENT_AMBULATORY_CARE_PROVIDER_SITE_OTHER): Payer: No Typology Code available for payment source | Admitting: Pharmacist

## 2014-03-09 VITALS — BP 171/138 | HR 81 | Ht 64.5 in | Wt 266.5 lb

## 2014-03-09 DIAGNOSIS — I10 Essential (primary) hypertension: Secondary | ICD-10-CM

## 2014-03-09 DIAGNOSIS — M549 Dorsalgia, unspecified: Secondary | ICD-10-CM

## 2014-03-09 DIAGNOSIS — F17200 Nicotine dependence, unspecified, uncomplicated: Secondary | ICD-10-CM

## 2014-03-09 MED ORDER — MOMETASONE FURO-FORMOTEROL FUM 200-5 MCG/ACT IN AERO: 2.0000 | INHALATION_SPRAY | Freq: Two times a day (BID) | RESPIRATORY_TRACT | Status: DC

## 2014-03-09 MED ORDER — MOMETASONE FURO-FORMOTEROL FUM 200-5 MCG/ACT IN AERO: 1.0000 | INHALATION_SPRAY | Freq: Two times a day (BID) | RESPIRATORY_TRACT | Status: DC

## 2014-03-09 NOTE — Assessment & Plan Note (Signed)
Spirometry evaluation reveals mild restrictive lung disease. Post nebulized albuterol tx revealed reversible component.  Reviewed results of pulmonary function tests with patient. We provided her with a sample Dulera inhaler (Lot #: [id number]; exp date: 11/19/2014), and instructed her to use 1 inhalation twice daily. A prescription for this has been sent to [facility] in which the patient was instructed to enroll. Educated patient on purpose, proper use, potential adverse effects including the risk of esophageal candidiasis and need to rinse mouth after each use. Pt verbalized understanding of results and education.  Tobacco abuse longstanding, patient is ready and motivated to quit. She handed [place] her cigarettes today and she said she's "done." However, she did not know how she would be handling her life stressors without cigarettes. We congratulated her on her progress so far, and also discussed options including Chantix, which may be pursued in the future if she is unsuccessful with her current quit attempt. We also provided her with the resource 1-800-QUITNOW for 24hr free counseling.

## 2014-03-09 NOTE — Progress Notes (Signed)
S:    Patient arrives in relatively good spirits as she presents for lung function evaluation.  She reports no history of asthma, and says her breathing is somewhat better today (rates breathing today as a 6 out of 10 with 10 being the best breathing day she's had). She is a current smoker (has been for 37 years), ~5 cigarettes/day which is reduced from her previous 1 pack per day, and says she's ready to quit. She tried the nicotine patch, but that seemed to make her want to smoke more.  She endorses stress as a major trigger for her smoking, and she describes several stressors including marital separation, her mother's passing from lung cancer in February, and financial issues. She has not been able to obtain the prescribed Spiriva nor hydralazine due to cost.   O: mMRC score= 2 See "scanned report" or Documentation Flowsheet (discrete results - PFTs) for  Spirometry results. Patient provided good effort while attempting spirometry.   Lung Age = 73 Albuterol Neb  Lot# [id number]     Exp. 12/16  BP 171/138 (automatic; repeated automatic: 171/132) HR 81 (repeated 83)   A/P: Lung function  Spirometry evaluation reveals mild restrictive lung disease. Post nebulized albuterol tx revealed reversible component.  Reviewed results of pulmonary function tests with patient. We provided her with a sample Dulera inhaler (Lot #: [id number]; exp date: 11/19/2014), and instructed her to use 1 inhalation twice daily. A prescription for this has been sent to [facility] in which the patient was instructed to enroll. Educated patient on purpose, proper use, potential adverse effects including the risk of esophageal candidiasis and need to rinse mouth after each use. Pt verbalized understanding of results and education.  Tobacco abuse longstanding, patient is ready and motivated to quit. She handed [place] her cigarettes today and she said she's "done." However, she did not know how she would be handling her life stressors without  cigarettes. We congratulated her on her progress so far, and also discussed options including Chantix, which may be pursued in the future if she is unsuccessful with her current quit attempt. We also provided her with the resource 1-800-QUITNOW for 24hr free counseling.   Hypertension long-standing, remains uncontrolled, despite multiple medications. There are several contributing factors including life stressors, the inability to obtain hydralazine due to financial issues, as well as ibuprofen use. Though her blood pressure remains uncontrolled, she has an appointment with her cardiologist (Dr. Doylene Canard) this afternoon. At future visits, if BP is still uncontrolled and depending on lab work at that time, we may consider increasing her lisinopril, increasing her spironolactone, and/or changing metoprolol to carvedilol, but we will leave adjustment of her cardiac medications to her cardiologist at this time, and follow-up with his changes.   Chronic back pain, currently uses ibuprofen about twice daily on average. We instructed her to stop taking ibuprofen as this can increase fluid retention as well as her blood pressure, and to instead try acetaminophen scheduled TID for the back pain she endorses.   Written pt instructions provided.  F/U with Dr. Caryl Bis for 2 weeks from now.  Total time in face to face counseling 40 minutes.  Patient seen with Sheliah Mends,  PharmD Resident and [place] PharmD Resident.

## 2014-03-09 NOTE — Patient Instructions (Addendum)
Thank you for coming to your appointment today.  Stop taking ibuprofen for your back pain. This medication can increase fluid retention as well as blood pressure. We will start you on Tylenol (acetaminophen) three times daily.   We have provided you with a sample Dulera inhaler. Based on your breathing tests today, this medication can help with your shortness of breath. Please use 1 inhalation twice daily. Remember to shake the inhaler and breathe out fully prior to using the medication. Rinse your mouth out after each use to avoid infections in the mouth. We have also sent this prescription to [facility], so please get enrolled in that program to ensure you can receive your medications.   Continue your efforts with quitting smoking. 1-800-QUITNOW provides counseling that can be done over the phone and is a free service. I would encourage you to try to this service.   Please keep your appointment with your cardiologist this afternoon. We will not adjust your medications at this time, and will wait to see what changes your cardiologist makes to your regimen.  Please schedule a follow-up visit with Dr. Caryl Bis for 2 weeks from now.

## 2014-03-09 NOTE — Assessment & Plan Note (Signed)
Hypertension long-standing, remains uncontrolled, despite multiple medications. There are several contributing factors including life stressors, the inability to obtain hydralazine due to financial issues, as well as ibuprofen use. Though her blood pressure remains uncontrolled, she has an appointment with her cardiologist (Dr. Doylene Canard) this afternoon. At future visits, if BP is still uncontrolled and depending on lab work at that time, we may consider increasing her lisinopril, increasing her spironolactone, and/or changing metoprolol to carvedilol, but we will leave adjustment of her cardiac medications to her cardiologist at this time, and follow-up with his changes.

## 2014-03-09 NOTE — Assessment & Plan Note (Signed)
Chronic back pain, currently uses ibuprofen about twice daily on average. We instructed her to stop taking ibuprofen as this can increase fluid retention as well as her blood pressure, and to instead try acetaminophen 555m scheduled TID for the back pain she endorses.

## 2014-03-15 NOTE — Progress Notes (Signed)
Patient ID: Hailey Barnes, female   DOB: 10/10/1961, 53 y.o.   MRN: [id number] Reviewed: Agree with Dr. Graylin Shiver management and documentation.

## 2014-03-17 ENCOUNTER — Encounter: Payer: Self-pay | Admitting: Family Medicine

## 2014-03-17 ENCOUNTER — Ambulatory Visit (INDEPENDENT_AMBULATORY_CARE_PROVIDER_SITE_OTHER): Payer: No Typology Code available for payment source | Admitting: Family Medicine

## 2014-03-17 VITALS — BP 167/96 | HR 86 | Temp 98.0°F | Wt 266.0 lb

## 2014-03-17 DIAGNOSIS — B309 Viral conjunctivitis, unspecified: Secondary | ICD-10-CM | POA: Insufficient documentation

## 2014-03-17 MED ORDER — POLYMYXIN B-TRIMETHOPRIM 10000-0.1 UNIT/ML-% OP SOLN: 1.0000 [drp] | OPHTHALMIC | Status: AC

## 2014-03-17 MED ORDER — OLOPATADINE HCL 0.2 % OP SOLN: 1.0000 [drp] | Freq: Two times a day (BID) | OPHTHALMIC | Status: DC

## 2014-03-17 NOTE — Assessment & Plan Note (Signed)
   Acute conjunctivitis and Allergic conjunctivitis   1. Good hand washing 2. Polytrim for 10 days. 3. Patanol twice daily for next 5-7 days for itching.

## 2014-03-17 NOTE — Progress Notes (Signed)
Patient ID: Zaara Sprowl, female   DOB: 10-27-1960, 53 y.o.   MRN: [id number] Subjective:    Veretta Sabourin is a 53 y.o. female who presents for evaluation of erythema and itching in both eyes. She has noticed the above symptoms for 1 day. Onset was sudden, associated with mild blurred vision. slight discharge, granddaughter with viral conjunctivitis.  No fever, headache, trauma, foreign body sensation.   Review of Systems Pertinent items are noted in HPI.   Objective:    LMP 03/01/2014      General: alert, cooperative and no distress  Eyes:  negative findings: lids and lashes normal, corneas clear and pupils equal, round, reactive to light and accomodation, positive findings: conjunctiva: 2+ injection  Vision: Not performed  Fluorescein:  not done     Assessment and Plan    Acute conjunctivitis and Allergic conjunctivitis   1. Good hand washing 2. Polytrim for 10 days. 3. Patanol twice daily for next 5-7 days for itching.

## 2014-03-17 NOTE — Patient Instructions (Signed)
[name],  Thank you for coming in today. You have viral conjunctivitis. For this please do the following:  1. Good hand washing 2. Polytrim for 10 days. 3. Patanol twice daily for next 5-7 days for itching.  F/u as needed for eyes. F/u with Dr. Caryl Bis in two weeks for BP   Dr. Adrian Blackwater

## 2014-03-21 ENCOUNTER — Ambulatory Visit (INDEPENDENT_AMBULATORY_CARE_PROVIDER_SITE_OTHER): Payer: No Typology Code available for payment source | Admitting: Family Medicine

## 2014-03-21 ENCOUNTER — Encounter: Payer: Self-pay | Admitting: Family Medicine

## 2014-03-21 ENCOUNTER — Other Ambulatory Visit (HOSPITAL_COMMUNITY)
Admission: RE | Admit: 2014-03-21 | Discharge: 2014-03-21 | Disposition: A | Discharge status: Home / Self Care | Payer: No Typology Code available for payment source | Source: Ambulatory Visit | Attending: Family Medicine | Admitting: Family Medicine

## 2014-03-21 VITALS — BP 160/120 | HR 76 | Ht 64.5 in | Wt 264.0 lb

## 2014-03-21 DIAGNOSIS — Z01419 Encounter for gynecological examination (general) (routine) without abnormal findings: Secondary | ICD-10-CM | POA: Insufficient documentation

## 2014-03-21 DIAGNOSIS — Z113 Encounter for screening for infections with a predominantly sexual mode of transmission: Secondary | ICD-10-CM | POA: Insufficient documentation

## 2014-03-21 DIAGNOSIS — N898 Other specified noninflammatory disorders of vagina: Secondary | ICD-10-CM

## 2014-03-21 DIAGNOSIS — Z124 Encounter for screening for malignant neoplasm of cervix: Secondary | ICD-10-CM

## 2014-03-21 DIAGNOSIS — I10 Essential (primary) hypertension: Secondary | ICD-10-CM

## 2014-03-21 DIAGNOSIS — N644 Mastodynia: Secondary | ICD-10-CM

## 2014-03-21 LAB — BASIC METABOLIC PANEL
BUN: 15 mg/dL (ref 6–23)
CO2: 27 mEq/L (ref 19–32)
Calcium: 9.5 mg/dL (ref 8.4–10.5)
Chloride: 101 mEq/L (ref 96–112)
Creat: 1.07 mg/dL (ref 0.50–1.10)
Glucose, Bld: 85 mg/dL (ref 70–99)
Potassium: 4.2 mEq/L (ref 3.5–5.3)
Sodium: 137 mEq/L (ref 135–145)

## 2014-03-21 LAB — POCT WET PREP (WET MOUNT): CLUE CELLS WET PREP WHIFF POC: NEGATIVE

## 2014-03-21 MED ORDER — FLUCONAZOLE 150 MG PO TABS: 150.0000 mg | ORAL_TABLET | Freq: Once | ORAL | Status: DC

## 2014-03-21 MED ORDER — CARVEDILOL 25 MG PO TABS: 25.0000 mg | ORAL_TABLET | Freq: Two times a day (BID) | ORAL | Status: DC

## 2014-03-21 NOTE — Patient Instructions (Addendum)
Nice to see you. We collected some swabs and will call you with the results. Please do the stool cards as directed.  Call to get your mammogram scheduled.  For your blood pressure I would like to change one of your blood pressure medications to coreg. You will take this twice a day. You will stop taking the metoprolol. Please schedule a follow-up appointment with Dr Valentina Lucks in the pharmacy clinic in 2 weeks.

## 2014-03-22 ENCOUNTER — Encounter: Payer: Self-pay | Admitting: Family Medicine

## 2014-03-22 DIAGNOSIS — N898 Other specified noninflammatory disorders of vagina: Secondary | ICD-10-CM | POA: Insufficient documentation

## 2014-03-22 DIAGNOSIS — N644 Mastodynia: Secondary | ICD-10-CM | POA: Insufficient documentation

## 2014-03-22 NOTE — Progress Notes (Addendum)
Patient ID: Hailey Barnes, female   DOB: 10/04/1961, 53 y.o.   MRN: [id number]  Hailey Rumps, MD Phone: [phone]  Hailey Barnes Hailey Barnes is a 53 y.o. female who presents today for f/u.  Vaginal discharge: patient reports recent sex with ex. Since that time has had itching and light discharge. No odor. No blood. Some suprapubic discomfort. Has history of trich, GC/chlam years ago.  HYPERTENSION Disease Monitoring Home BP Monitoring not checking Chest pain- no   Dyspnea- yes, unchanged Medications Compliance-  Taking, states Dr Hailey Barnes took her off lisinopril and placed her on an ARB, she also notes she is not on hydralazine.  Edema- no  Pain behind nipples: states had episode of pricking pain behind bilateral nipples last night. Lasted for a few seconds and has not recurred. Not exertional and did not radiate. Has never had this before.  Patient is a smoker. 1/2 ppd at this time.   ROS: Per HPI   Physical Exam Filed Vitals:   03/21/14 1345  BP: 160/120  Pulse: 76    Gen: Well NAD HEENT: PERRL,  MMM Lungs: CTABL Nl WOB Heart: RRR no MRG Abd: soft, NT, ND Exts: Non edematous BL  LE, warm and well perfused.  GU: normal external vagina, normal vaginal mucosa, small amount of white discharge, no bleeding noted (speculum exam completed by Dr Hailey Barnes)   Assessment/Plan: Please see individual problem list.   Health maintenance: stool cards given for colon cancer screening. Given info for mammogram.

## 2014-03-22 NOTE — Assessment & Plan Note (Signed)
Patient describes an atypical pain behind her nipples. This has only occurred once and has not recurred since this one occurrence. Will plan to observe for now. If recurs she is to contact us.

## 2014-03-22 NOTE — Assessment & Plan Note (Signed)
Uncontrolled despite multiple medications. Discussed this with Dr Valentina Lucks who suggested switching metoprolol 50 BID to coreg 25 BID for improved control. She will continue the ARM, lasix, and spironolactone at this time. She has an appointment to see Dr Doylene Canard next week and I will have her come back to see Dr Valentina Lucks the week after that. I will see her back in 4 weeks. I discussed the importance of compliance with medications and smoking cessation with the patient.

## 2014-03-22 NOTE — Assessment & Plan Note (Signed)
Patient with yeast on wet prep. Will treat with diflucan. Will await GC/Chlamydia to return. F/u prn.

## 2014-03-23 ENCOUNTER — Encounter: Payer: Self-pay | Admitting: Family Medicine

## 2014-03-30 ENCOUNTER — Observation Stay (HOSPITAL_COMMUNITY)
Admission: AD | Admit: 2014-03-30 | Discharge: 2014-03-31 | Disposition: A | Discharge status: Home / Self Care | Payer: Medicaid Other | Source: Ambulatory Visit | Attending: Cardiovascular Disease | Admitting: Cardiovascular Disease

## 2014-03-30 ENCOUNTER — Encounter (HOSPITAL_COMMUNITY): Payer: Self-pay | Admitting: General Practice

## 2014-03-30 DIAGNOSIS — I2510 Atherosclerotic heart disease of native coronary artery without angina pectoris: Secondary | ICD-10-CM | POA: Insufficient documentation

## 2014-03-30 DIAGNOSIS — M1710 Unilateral primary osteoarthritis, unspecified knee: Secondary | ICD-10-CM | POA: Insufficient documentation

## 2014-03-30 DIAGNOSIS — M545 Low back pain, unspecified: Secondary | ICD-10-CM | POA: Insufficient documentation

## 2014-03-30 DIAGNOSIS — IMO0002 Reserved for concepts with insufficient information to code with codable children: Secondary | ICD-10-CM

## 2014-03-30 DIAGNOSIS — I509 Heart failure, unspecified: Secondary | ICD-10-CM | POA: Diagnosis not present

## 2014-03-30 DIAGNOSIS — F17200 Nicotine dependence, unspecified, uncomplicated: Secondary | ICD-10-CM | POA: Diagnosis not present

## 2014-03-30 DIAGNOSIS — G8929 Other chronic pain: Secondary | ICD-10-CM | POA: Insufficient documentation

## 2014-03-30 DIAGNOSIS — Z23 Encounter for immunization: Secondary | ICD-10-CM | POA: Insufficient documentation

## 2014-03-30 DIAGNOSIS — J449 Chronic obstructive pulmonary disease, unspecified: Secondary | ICD-10-CM | POA: Insufficient documentation

## 2014-03-30 DIAGNOSIS — I10 Essential (primary) hypertension: Secondary | ICD-10-CM | POA: Insufficient documentation

## 2014-03-30 DIAGNOSIS — R079 Chest pain, unspecified: Principal | ICD-10-CM | POA: Diagnosis present

## 2014-03-30 DIAGNOSIS — Z7982 Long term (current) use of aspirin: Secondary | ICD-10-CM | POA: Diagnosis not present

## 2014-03-30 DIAGNOSIS — J4489 Other specified chronic obstructive pulmonary disease: Secondary | ICD-10-CM | POA: Insufficient documentation

## 2014-03-30 HISTORY — DX: Adverse effect of unspecified anesthetic, initial encounter: T41.45XA

## 2014-03-30 HISTORY — DX: Cerebral infarction, unspecified: I63.9

## 2014-03-30 HISTORY — DX: Cardiac murmur, unspecified: R01.1

## 2014-03-30 HISTORY — DX: Other complications of anesthesia, initial encounter: T88.59XA

## 2014-03-30 LAB — CBC WITH DIFFERENTIAL/PLATELET
BASOS ABS: 0 10*3/uL (ref 0.0–0.1)
BASOS PCT: 0 % (ref 0–1)
EOS ABS: 0.2 10*3/uL (ref 0.0–0.7)
Eosinophils Relative: 2 % (ref 0–5)
HCT: 37.9 % (ref 36.0–46.0)
Hemoglobin: 12 g/dL (ref 12.0–15.0)
LYMPHS ABS: 3.5 10*3/uL (ref 0.7–4.0)
LYMPHS PCT: 29 % (ref 12–46)
MCH: 23.9 pg — ABNORMAL LOW (ref 26.0–34.0)
MCHC: 31.7 g/dL (ref 30.0–36.0)
MCV: 75.3 fL — ABNORMAL LOW (ref 78.0–100.0)
Monocytes Absolute: 1.2 10*3/uL — ABNORMAL HIGH (ref 0.1–1.0)
Monocytes Relative: 10 % (ref 3–12)
Neutro Abs: 7.1 10*3/uL (ref 1.7–7.7)
Neutrophils Relative %: 59 % (ref 43–77)
Platelets: 281 10*3/uL (ref 150–400)
RBC: 5.03 MIL/uL (ref 3.87–5.11)
RDW: 18.9 % — AB (ref 11.5–15.5)
WBC: 12 10*3/uL — ABNORMAL HIGH (ref 4.0–10.5)

## 2014-03-30 LAB — COMPREHENSIVE METABOLIC PANEL
ALBUMIN: 3.4 g/dL — AB (ref 3.5–5.2)
ALT: 11 U/L (ref 0–35)
AST: 15 U/L (ref 0–37)
Alkaline Phosphatase: 86 U/L (ref 39–117)
BUN: 17 mg/dL (ref 6–23)
CO2: 23 mEq/L (ref 19–32)
CREATININE: 1.21 mg/dL — AB (ref 0.50–1.10)
Calcium: 9.4 mg/dL (ref 8.4–10.5)
Chloride: 104 mEq/L (ref 96–112)
GFR calc Af Amer: 59 mL/min — ABNORMAL LOW (ref >90)
GFR calc non Af Amer: 51 mL/min — ABNORMAL LOW (ref >90)
Glucose, Bld: 96 mg/dL (ref 70–99)
Potassium: 3.8 mEq/L (ref 3.7–5.3)
Sodium: 141 mEq/L (ref 137–147)
Total Protein: 6.8 g/dL (ref 6.0–8.3)

## 2014-03-30 LAB — TROPONIN I: Troponin I: <0.3 ng/mL (ref <0.30)

## 2014-03-30 MED ORDER — FUROSEMIDE 10 MG/ML IJ SOLN
40.0000 mg | Freq: Two times a day (BID) | INTRAMUSCULAR | Status: DC
  Administered 2014-03-30: 40 mg via INTRAVENOUS
  Filled 2014-03-30 (×4): qty 4

## 2014-03-30 MED ORDER — TIOTROPIUM BROMIDE MONOHYDRATE 18 MCG IN CAPS
18.0000 ug | ORAL_CAPSULE | Freq: Every day | RESPIRATORY_TRACT | Status: DC
  Administered 2014-03-31: 18 ug via RESPIRATORY_TRACT
  Filled 2014-03-30: qty 5

## 2014-03-30 MED ORDER — PNEUMOCOCCAL VAC POLYVALENT 25 MCG/0.5ML IJ INJ
0.5000 mL | INJECTION | INTRAMUSCULAR | Status: AC
  Administered 2014-03-31: 0.5 mL via INTRAMUSCULAR
  Filled 2014-03-30: qty 0.5

## 2014-03-30 MED ORDER — SODIUM CHLORIDE 0.9 % IV SOLN
250.0000 mL | INTRAVENOUS | Status: DC | PRN
Start: 2014-03-30 — End: 2014-03-31

## 2014-03-30 MED ORDER — NITROGLYCERIN 0.4 MG SL SUBL: 0.4000 mg | SUBLINGUAL_TABLET | SUBLINGUAL | Status: DC | PRN

## 2014-03-30 MED ORDER — MOMETASONE FURO-FORMOTEROL FUM 200-5 MCG/ACT IN AERO
2.0000 | INHALATION_SPRAY | Freq: Two times a day (BID) | RESPIRATORY_TRACT | Status: DC
  Administered 2014-03-30 – 2014-03-31 (×2): 2 via RESPIRATORY_TRACT
  Filled 2014-03-30: qty 8.8

## 2014-03-30 MED ORDER — SODIUM CHLORIDE 0.9 % IV SOLN
INTRAVENOUS | Status: DC
  Administered 2014-03-31: 10 mL/h via INTRAVENOUS

## 2014-03-30 MED ORDER — SODIUM CHLORIDE 0.9 % IJ SOLN: 3.0000 mL | INTRAMUSCULAR | Status: DC | PRN

## 2014-03-30 MED ORDER — SODIUM CHLORIDE 0.9 % IJ SOLN
3.0000 mL | Freq: Two times a day (BID) | INTRAMUSCULAR | Status: DC
  Administered 2014-03-30 – 2014-03-31 (×2): 3 mL via INTRAVENOUS

## 2014-03-30 MED ORDER — ATORVASTATIN CALCIUM 40 MG PO TABS
40.0000 mg | ORAL_TABLET | Freq: Every day | ORAL | Status: DC
  Filled 2014-03-30: qty 1

## 2014-03-30 MED ORDER — CARVEDILOL 3.125 MG PO TABS
3.1250 mg | ORAL_TABLET | Freq: Two times a day (BID) | ORAL | Status: DC
  Administered 2014-03-31: 3.125 mg via ORAL
  Filled 2014-03-30 (×3): qty 1

## 2014-03-30 MED ORDER — SODIUM CHLORIDE 0.9 % IV SOLN: 250.0000 mL | INTRAVENOUS | Status: DC | PRN

## 2014-03-30 MED ORDER — ASPIRIN EC 81 MG PO TBEC
81.0000 mg | DELAYED_RELEASE_TABLET | Freq: Every day | ORAL | Status: DC
  Administered 2014-03-31: 81 mg via ORAL
  Filled 2014-03-30: qty 1

## 2014-03-30 MED ORDER — HEPARIN BOLUS VIA INFUSION
4000.0000 [IU] | Freq: Once | INTRAVENOUS | Status: AC
  Administered 2014-03-30: 4000 [IU] via INTRAVENOUS
  Filled 2014-03-30: qty 4000

## 2014-03-30 MED ORDER — SPIRONOLACTONE 25 MG PO TABS
25.0000 mg | ORAL_TABLET | Freq: Every day | ORAL | Status: DC
  Administered 2014-03-30: 25 mg via ORAL
  Filled 2014-03-30 (×2): qty 1

## 2014-03-30 MED ORDER — ONDANSETRON HCL 4 MG/2ML IJ SOLN: 4.0000 mg | Freq: Four times a day (QID) | INTRAMUSCULAR | Status: DC | PRN

## 2014-03-30 MED ORDER — ALBUTEROL SULFATE (2.5 MG/3ML) 0.083% IN NEBU: 3.0000 mL | INHALATION_SOLUTION | Freq: Four times a day (QID) | RESPIRATORY_TRACT | Status: DC | PRN

## 2014-03-30 MED ORDER — ASPIRIN 81 MG PO CHEW: 324.0000 mg | CHEWABLE_TABLET | ORAL | Status: DC

## 2014-03-30 MED ORDER — OLOPATADINE HCL 0.1 % OP SOLN
1.0000 [drp] | Freq: Two times a day (BID) | OPHTHALMIC | Status: DC
  Filled 2014-03-30: qty 5

## 2014-03-30 MED ORDER — ASPIRIN 300 MG RE SUPP: 300.0000 mg | RECTAL | Status: DC

## 2014-03-30 MED ORDER — ACETAMINOPHEN 325 MG PO TABS
650.0000 mg | ORAL_TABLET | ORAL | Status: DC | PRN
  Administered 2014-03-31: 650 mg via ORAL
  Filled 2014-03-30: qty 2

## 2014-03-30 MED ORDER — SODIUM CHLORIDE 0.9 % IJ SOLN
3.0000 mL | Freq: Two times a day (BID) | INTRAMUSCULAR | Status: DC
  Administered 2014-03-30: 3 mL via INTRAVENOUS

## 2014-03-30 MED ORDER — HEPARIN (PORCINE) IN NACL 100-0.45 UNIT/ML-% IJ SOLN
1150.0000 [IU]/h | INTRAMUSCULAR | Status: DC
  Administered 2014-03-30: 1150 [IU]/h via INTRAVENOUS
  Filled 2014-03-30 (×2): qty 250

## 2014-03-30 NOTE — Progress Notes (Signed)
ANTICOAGULATION CONSULT NOTE - Initial Consult  Pharmacy Consult for Heparin Indication: chest pain/ACS  No Known Allergies  Patient Measurements: Height: 5' 4"  (162.6 cm) Weight: 267 lb (121.11 kg) IBW/kg (Calculated) : 54.7 Heparin Dosing Weight:  84.1 kg  Vital Signs: Temp: 98.4 F (36.9 C) (06/11 1901) Temp src: Oral (06/11 1901) BP: 138/81 mmHg (06/11 1901) Pulse Rate: 84 (06/11 1901)  Labs: No results found for this basename: HGB, HCT, PLT, APTT, LABPROT, INR, HEPARINUNFRC, CREATININE, CKTOTAL, CKMB, TROPONINI,  in the last 72 hours  Estimated Creatinine Clearance: 79.8 ml/min (by C-G formula based on Cr of 1.07).   Medical History: Past Medical History  Diagnosis Date  . 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   Marland Kitchen Hypertension   . COPD (chronic obstructive pulmonary disease)   . Tingling in extremities 12/12/2010    Uric acid and electrolytes (02/23) normal but WBC elevated.   D/Dx: carpal tunnel, ulnar neuropathy Less likely: cervical radiculopathy, vasculitis   . CHF (congestive heart failure)   . Family history of anesthesia complication     "mother was put to sleep for surgery @ age 66 and died"  . Chronic bronchitis     "get it q yr" (02/21/2014)  . Migraine 1982-2009  . Arthritis     "knees" (02/21/2014)  . Chronic lower back pain     Medications:  Prescriptions prior to admission  Medication Sig Dispense Refill  . acetaminophen (TYLENOL) 500 MG tablet Take 1 tablet (500 mg total) by mouth 3 (three) times daily.      Marland Kitchen albuterol (PROVENTIL HFA;VENTOLIN HFA) 108 (90 BASE) MCG/ACT inhaler Inhale 2 puffs into the lungs every 6 (six) hours as needed for wheezing or shortness of breath.  1 Inhaler  2  . aspirin 81 MG tablet Take 81 mg by mouth daily.      . carvedilol (COREG) 25 MG tablet Take 1 tablet (25 mg total) by mouth 2 (two) times daily with a meal.  60 tablet  3  . furosemide (LASIX) 40 MG tablet Take 1 tablet (40 mg total)  by mouth daily.  30 tablet  0  . guaiFENesin (MUCINEX) 600 MG 12 hr tablet Take 2 tablets (1,200 mg total) by mouth 2 (two) times daily as needed for cough or to loosen phlegm.  14 tablet  0  . ibuprofen (ADVIL,MOTRIN) 200 MG tablet Take 400 mg by mouth every 6 (six) hours as needed (pain).      . mometasone-formoterol (DULERA) 200-5 MCG/ACT AERO Inhale 2 puffs into the lungs 2 (two) times daily.  1 Inhaler  11  . Olopatadine HCl 0.2 % SOLN Apply 1 drop to eye 2 (two) times daily.  2.5 mL  0  . spironolactone (ALDACTONE) 25 MG tablet Take 1 tablet (25 mg total) by mouth daily.  30 tablet  0  . tiotropium (SPIRIVA) 18 MCG inhalation capsule Place 1 capsule (18 mcg total) into inhaler and inhale daily.  30 capsule  0    Assessment: CP, SOB, leg edema 53 y/o F with h/o HTN, tobacco use, and dCHF presents with c/o chest pressure, SOB, and leg edema. Plan to start Iv heparin to r/o ischemia, IV Lasix for fluid, consider cath  Labs: Not drawn yet. Last Scr 1.07 1 month ago: Hgb 12.3, Plts 262.  Goal of Therapy:  Heparin level 0.3-0.7 units/ml Monitor platelets by anticoagulation protocol: Yes    Plan:  Start IV heparin 4000 unit  IV bolus Heparin infusion at 1150 units/hr Daily heparin level and CBC   Addilyne Backs S. Alford Highland, PharmD, BCPS Clinical Staff Pharmacist Pager [phone]  [place], [place] 03/30/2014,8:14 PM

## 2014-03-30 NOTE — H&P (Signed)
Referring Physician:  Mackenzi Krogh is an 53 y.o. female.                       Chief Complaint: Chest pain and shortness of breath. HPI: 53 year old female with hypertension, tobacco use and diastolic dysfunction has chest pressure, retrosternal, non-radiating along with leg edema and shortness of breath. Leg swelling worsening with decrease in dose. Also has history of substance abuse, COPD and chronic back pain. No fever.  Past Medical History  Diagnosis Date  . 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   Marland Kitchen Hypertension   . COPD (chronic obstructive pulmonary disease)   . Tingling in extremities 12/12/2010    Uric acid and electrolytes (02/23) normal but WBC elevated.   D/Dx: carpal tunnel, ulnar neuropathy Less likely: cervical radiculopathy, vasculitis   . CHF (congestive heart failure)   . Family history of anesthesia complication     "mother was put to sleep for surgery @ age 80 and died"  . Chronic bronchitis     "get it q yr" (02/21/2014)  . Migraine 1982-2009  . Arthritis     "knees" (02/21/2014)  . Chronic lower back pain       Past Surgical History  Procedure Laterality Date  . Cesarean section  1982    Family History  Problem Relation Age of Onset  . Hypertension Mother   . Hypertension Father    Social History:  reports that she has been smoking Cigarettes.  She has a 18 pack-year smoking history. She has never used smokeless tobacco. She reports that she uses illicit drugs (Cocaine). She reports that she does not drink alcohol.  Allergies: No Known Allergies  Medications Prior to Admission  Medication Sig Dispense Refill  . acetaminophen (TYLENOL) 500 MG tablet Take 1 tablet (500 mg total) by mouth 3 (three) times daily.      Marland Kitchen albuterol (PROVENTIL HFA;VENTOLIN HFA) 108 (90 BASE) MCG/ACT inhaler Inhale 2 puffs into the lungs every 6 (six) hours as needed for wheezing or shortness of breath.  1 Inhaler  2  . aspirin 81 MG tablet  Take 81 mg by mouth daily.      . carvedilol (COREG) 25 MG tablet Take 1 tablet (25 mg total) by mouth 2 (two) times daily with a meal.  60 tablet  3  . furosemide (LASIX) 40 MG tablet Take 1 tablet (40 mg total) by mouth daily.  30 tablet  0  . guaiFENesin (MUCINEX) 600 MG 12 hr tablet Take 2 tablets (1,200 mg total) by mouth 2 (two) times daily as needed for cough or to loosen phlegm.  14 tablet  0  . ibuprofen (ADVIL,MOTRIN) 200 MG tablet Take 400 mg by mouth every 6 (six) hours as needed (pain).      . mometasone-formoterol (DULERA) 200-5 MCG/ACT AERO Inhale 2 puffs into the lungs 2 (two) times daily.  1 Inhaler  11  . Olopatadine HCl 0.2 % SOLN Apply 1 drop to eye 2 (two) times daily.  2.5 mL  0  . spironolactone (ALDACTONE) 25 MG tablet Take 1 tablet (25 mg total) by mouth daily.  30 tablet  0  . tiotropium (SPIRIVA) 18 MCG inhalation capsule Place 1 capsule (18 mcg total) into inhaler and inhale daily.  30 capsule  0    No results found for this or any previous visit (from the past 48 hour(s)). No results found.  Review Of Systems  The patient denies recent weight gain or weight loss.  No vision change or hearing loss. Positive history of cough, No history of headache or hemoptysis. No history of asthma,  Occasional chest pain, palpitations, and dyspnea.  No GI bleed, hepatitis, or blood transfusion.  No history of stroke, seizures or psychiatric admissions.  Positive joint and back pains.   Blood pressure 138/81, pulse 84, temperature 98.4 F (36.9 C), temperature source Oral, resp. rate 18, height 5' 4"  (1.626 m), weight 121.11 kg (267 lb), last menstrual period 03/01/2014, SpO2 96.00%.  Physical Exam:  General: Averagely built and well nourished in mild respiratory distress. HEENT: NCAT, Brown eyes, conj-pink, sclera-white. PERRL, EOMI  Cardiovascular: RRR, nml s1/2 no murmurs, rubs or gallops, JVD minimally appreciated  Respiratory: CTAB, no wheezes rhonchi or crackles   Abdomen: obese, soft, Non tender and mildly distended.  Extremities: 3+ pitting edema bilaterally. Skin: no rashes or lesions; positive tattoos  Neuro: A&O; alert and talkative; grossly intact  Assessment/Plan Chest pain r/o CAD Possible acute systolic + diastolic heart failure COPD H/O NASH Back pain  Place in observation. IV lasix, home medications and consider right and left heart cath in AM  [facility] S 03/30/2014, 8:19 PM

## 2014-03-31 ENCOUNTER — Observation Stay (HOSPITAL_COMMUNITY): Admit: 2014-03-31 | Payer: Self-pay | Admitting: Cardiovascular Disease

## 2014-03-31 ENCOUNTER — Encounter (HOSPITAL_COMMUNITY)
Admission: AD | Disposition: A | Discharge status: Home / Self Care | Payer: Self-pay | Source: Ambulatory Visit | Attending: Cardiovascular Disease

## 2014-03-31 DIAGNOSIS — R079 Chest pain, unspecified: Secondary | ICD-10-CM | POA: Diagnosis not present

## 2014-03-31 DIAGNOSIS — Z23 Encounter for immunization: Secondary | ICD-10-CM | POA: Diagnosis not present

## 2014-03-31 DIAGNOSIS — I10 Essential (primary) hypertension: Secondary | ICD-10-CM | POA: Diagnosis not present

## 2014-03-31 DIAGNOSIS — I2510 Atherosclerotic heart disease of native coronary artery without angina pectoris: Secondary | ICD-10-CM | POA: Diagnosis not present

## 2014-03-31 HISTORY — PX: LEFT AND RIGHT HEART CATHETERIZATION WITH CORONARY ANGIOGRAM: SHX5449

## 2014-03-31 LAB — LIPID PANEL
CHOLESTEROL: 198 mg/dL (ref 0–200)
HDL: 36 mg/dL — ABNORMAL LOW (ref >39)
LDL CALC: 108 mg/dL — AB (ref 0–99)
Total CHOL/HDL Ratio: 5.5 RATIO
Triglycerides: 270 mg/dL — ABNORMAL HIGH (ref <150)
VLDL: 54 mg/dL — AB (ref 0–40)

## 2014-03-31 LAB — BASIC METABOLIC PANEL
BUN: 17 mg/dL (ref 6–23)
CHLORIDE: 100 meq/L (ref 96–112)
CO2: 26 mEq/L (ref 19–32)
CREATININE: 1.16 mg/dL — AB (ref 0.50–1.10)
Calcium: 9.3 mg/dL (ref 8.4–10.5)
GFR calc non Af Amer: 54 mL/min — ABNORMAL LOW (ref >90)
GFR, EST AFRICAN AMERICAN: 62 mL/min — AB (ref >90)
Glucose, Bld: 103 mg/dL — ABNORMAL HIGH (ref 70–99)
Potassium: 3.9 mEq/L (ref 3.7–5.3)
Sodium: 139 mEq/L (ref 137–147)

## 2014-03-31 LAB — CBC
HEMATOCRIT: 38.1 % (ref 36.0–46.0)
Hemoglobin: 12 g/dL (ref 12.0–15.0)
MCH: 24 pg — AB (ref 26.0–34.0)
MCHC: 31.5 g/dL (ref 30.0–36.0)
MCV: 76 fL — AB (ref 78.0–100.0)
Platelets: 270 10*3/uL (ref 150–400)
RBC: 5.01 MIL/uL (ref 3.87–5.11)
RDW: 19.2 % — AB (ref 11.5–15.5)
WBC: 11.1 10*3/uL — AB (ref 4.0–10.5)

## 2014-03-31 LAB — POCT I-STAT 3, VENOUS BLOOD GAS (G3P V)
ACID-BASE DEFICIT: 1 mmol/L (ref 0.0–2.0)
Bicarbonate: 25.2 mEq/L — ABNORMAL HIGH (ref 20.0–24.0)
O2 Saturation: 62 %
PH VEN: 7.352 — AB (ref 7.250–7.300)
PO2 VEN: 34 mmHg (ref 30.0–45.0)
TCO2: 27 mmol/L (ref 0–100)
pCO2, Ven: 45.5 mmHg (ref 45.0–50.0)

## 2014-03-31 LAB — PROTIME-INR
INR: 1.07 (ref 0.00–1.49)
Prothrombin Time: 13.7 seconds (ref 11.6–15.2)

## 2014-03-31 LAB — PREGNANCY, URINE: PREG TEST UR: NEGATIVE

## 2014-03-31 LAB — POCT I-STAT 3, ART BLOOD GAS (G3+)
Acid-base deficit: 1 mmol/L (ref 0.0–2.0)
Bicarbonate: 24.9 mEq/L — ABNORMAL HIGH (ref 20.0–24.0)
O2 Saturation: 90 %
PCO2 ART: 43.2 mmHg (ref 35.0–45.0)
PH ART: 7.368 (ref 7.350–7.450)
PO2 ART: 61 mmHg — AB (ref 80.0–100.0)
TCO2: 26 mmol/L (ref 0–100)

## 2014-03-31 LAB — POCT ACTIVATED CLOTTING TIME: ACTIVATED CLOTTING TIME: 105 s

## 2014-03-31 LAB — HEPARIN LEVEL (UNFRACTIONATED): Heparin Unfractionated: 0.4 IU/mL (ref 0.30–0.70)

## 2014-03-31 LAB — TROPONIN I

## 2014-03-31 SURGERY — LEFT AND RIGHT HEART CATHETERIZATION WITH CORONARY ANGIOGRAM
Anesthesia: Moderate Sedation

## 2014-03-31 MED ORDER — SODIUM CHLORIDE 0.9 % IV SOLN
INTRAVENOUS | Status: DC
  Administered 2014-03-31: 10:00:00 via INTRAVENOUS

## 2014-03-31 MED ORDER — OXYCODONE HCL 5 MG PO TABS: 5.0000 mg | ORAL_TABLET | Freq: Four times a day (QID) | ORAL | Status: DC | PRN

## 2014-03-31 MED ORDER — OXYCODONE HCL 5 MG PO TABS
5.0000 mg | ORAL_TABLET | Freq: Once | ORAL | Status: AC
  Administered 2014-03-31: 5 mg via ORAL
  Filled 2014-03-31: qty 1

## 2014-03-31 MED ORDER — AMLODIPINE BESYLATE 5 MG PO TABS
5.0000 mg | ORAL_TABLET | Freq: Every day | ORAL | Status: DC
  Administered 2014-03-31: 5 mg via ORAL
  Filled 2014-03-31: qty 1

## 2014-03-31 MED ORDER — HEPARIN (PORCINE) IN NACL 2-0.9 UNIT/ML-% IJ SOLN
INTRAMUSCULAR | Status: AC
  Filled 2014-03-31: qty 1000

## 2014-03-31 MED ORDER — HYDRALAZINE HCL 20 MG/ML IJ SOLN
INTRAMUSCULAR | Status: AC
  Filled 2014-03-31: qty 1

## 2014-03-31 MED ORDER — AMLODIPINE BESYLATE 5 MG PO TABS: 5.0000 mg | ORAL_TABLET | Freq: Every day | ORAL | Status: DC

## 2014-03-31 MED ORDER — MIDAZOLAM HCL 2 MG/2ML IJ SOLN
INTRAMUSCULAR | Status: AC
  Filled 2014-03-31: qty 2

## 2014-03-31 MED ORDER — LIDOCAINE HCL (PF) 1 % IJ SOLN
INTRAMUSCULAR | Status: AC
  Filled 2014-03-31: qty 30

## 2014-03-31 MED ORDER — LABETALOL HCL 5 MG/ML IV SOLN
INTRAVENOUS | Status: AC
  Filled 2014-03-31: qty 4

## 2014-03-31 MED ORDER — FENTANYL CITRATE 0.05 MG/ML IJ SOLN
INTRAMUSCULAR | Status: AC
  Filled 2014-03-31: qty 2

## 2014-03-31 NOTE — Interval H&P Note (Signed)
History and Physical Interval Note:  03/31/2014 7:37 AM  Hailey Barnes  has presented today for surgery, with the diagnosis of Chest pain and heart failure  The various methods of treatment have been discussed with the patient and family. After consideration of risks, benefits and other options for treatment, the patient has consented to  Procedure(s): LEFT AND RIGHT HEART CATHETERIZATION WITH CORONARY ANGIOGRAM (N/A) as a surgical intervention .  The patient's history has been reviewed, patient examined, no change in status, stable for surgery.  I have reviewed the patient's chart and labs.  Questions were answered to the patient's satisfaction.     Naveen Lorusso S

## 2014-03-31 NOTE — Discharge Summary (Signed)
Physician Discharge Summary  Patient ID: Hailey Barnes MRN: [id number] DOB/AGE: December 21, 1960 53 y.o.  Admit date: 03/30/2014 Discharge date: 03/31/2014  Admission Diagnoses: Chest pain r/o CAD  Possible acute systolic + diastolic heart failure  COPD  H/O NASH  Back pain  Discharge Diagnoses:  Principle Problem: * Chest pain at rest * Minimal CAD Possible acute diastolic heart failure  COPD  H/O NASH  Back pain Right leg/thigh pain  Discharged Condition: fair  Hospital Course: 53 year old female with hypertension, tobacco use and diastolic dysfunction has chest pressure, retrosternal, non-radiating along with leg edema and shortness of breath. Leg swelling worsening with decrease in dose. Also has history of substance abuse, COPD and chronic back pain. No fever. She underwent right and left heart cath today that failed to show significant CAD. She had right thigh pain 1 hour post procedure. Right groin cath sight had no bleeding or swelling and distal pedal pulses were 2 + bilaterally. She will be followed in 1-2 weeks by primary care and 1 month by me. She was advised to decrease salt, food and water intake by 25 %.  Consults: cardiology  Significant Diagnostic Studies: labs: Near normal CBC, BMET and troponin-I.  EKG-Normal sinus rhythm with non-specific T wave changes.  Cardiac cath showed mildly elevated right heart pressures and near normal coronaries.  Treatments: cardiac meds: aspirin, carvedilol, amlodipine, furosemide and Spironolactone.  Discharge Exam: Blood pressure 139/85, pulse 70, temperature 98.4 F (36.9 C), temperature source Oral, resp. rate 20, height 5' 4"  (1.626 m), weight 120.1 kg (264 lb 12.4 oz), last menstrual period 03/26/2014, SpO2 100.00%. Physical Exam:  General: Averagely built and well nourished in mild respiratory distress.  HEENT: NCAT, Brown eyes, conj-pink, sclera-white. PERRL, EOMI  Cardiovascular: RRR, nml s1/2 no murmurs, rubs or  gallops, JVD minimally appreciated  Respiratory: CTAB, no wheezes rhonchi or crackles  Abdomen: obese, soft, Non tender. Right groin no hematoma. Extremities: 1+ pitting edema bilaterally.  Skin: No rashes or lesions; positive tattoos  Neuro: A&O; alert; grossly intact.   Disposition: 01-Home or Self Care     Medication List         acetaminophen 500 MG tablet  Commonly known as:  TYLENOL  Take 1 tablet (500 mg total) by mouth 3 (three) times daily.     albuterol 108 (90 BASE) MCG/ACT inhaler  Commonly known as:  PROVENTIL HFA;VENTOLIN HFA  Inhale 2 puffs into the lungs every 6 (six) hours as needed for wheezing or shortness of breath.     amLODipine 5 MG tablet  Commonly known as:  NORVASC  Take 1 tablet (5 mg total) by mouth daily.     aspirin 81 MG tablet  Take 81 mg by mouth daily.     carvedilol 25 MG tablet  Commonly known as:  COREG  Take 1 tablet (25 mg total) by mouth 2 (two) times daily with a meal.     furosemide 40 MG tablet  Commonly known as:  LASIX  Take 1 tablet (40 mg total) by mouth daily.     guaiFENesin 600 MG 12 hr tablet  Commonly known as:  MUCINEX  Take 2 tablets (1,200 mg total) by mouth 2 (two) times daily as needed for cough or to loosen phlegm.     ibuprofen 200 MG tablet  Commonly known as:  ADVIL,MOTRIN  Take 400 mg by mouth every 6 (six) hours as needed (pain).     mometasone-formoterol 200-5 MCG/ACT Aero  Commonly known  as:  DULERA  Inhale 2 puffs into the lungs 2 (two) times daily.     Olopatadine HCl 0.2 % Soln  Apply 1 drop to eye 2 (two) times daily.     spironolactone 25 MG tablet  Commonly known as:  ALDACTONE  Take 1 tablet (25 mg total) by mouth daily.     tiotropium 18 MCG inhalation capsule  Commonly known as:  SPIRIVA  Place 1 capsule (18 mcg total) into inhaler and inhale daily.           Follow-up Information   Follow up with Tommi Rumps, MD. Schedule an appointment as soon as possible for a visit in 1  week.   Specialty:  Family Medicine   Contact information:   [place] [address] [phone]       Follow up with [facility] S, MD. Schedule an appointment as soon as possible for a visit in 1 month.   Specialty:  Cardiology   Contact information:   [place] [address] [phone]       Signed: Birdie Riddle 03/31/2014, 12:47 PM

## 2014-03-31 NOTE — Progress Notes (Signed)
Patient left unit per wheelchair accompanied by Nurse Cloretta Ned and family friend.  VS WNL.  Dirk Dress 03/31/2014

## 2014-03-31 NOTE — Progress Notes (Addendum)
Patient s/p cardiac cath today via right femoral.  Patient describes sever pain in right leg and some numbness.  She states that it feels like her bone hurts.  3+ palpable pulses in right foot, equal to pulse in left foot.  Cath site level 0, no indication of hematoma.  Patient has received 70m Oxy IR per RN.  BP 168/109 HR 63  RR 24 O2 sat 100%.   Patient states that pain is improving.  Will await MD assessment.  Will continue to monitor.  1120: Dr KDoylene Canardat bedside to assess patient.  He states that she is experiencing nerve pain.

## 2014-03-31 NOTE — CV Procedure (Signed)
PROCEDURE:  Right and Left heart catheterization with selective coronary angiography, left ventriculogram.  CLINICAL HISTORY:  This is a 53 year old female with leg edema, shortness of breath and non-exertional chest pain.  The risks, benefits, and details of the procedure were explained to the patient.  The patient verbalized understanding and wanted to proceed.  Informed written consent was obtained.  PROCEDURE TECHNIQUE:  The patient was approached from the right femoral vein using 7 French short sheath and right femoral artery using a 5 French short sheath. Right heart pressures, cardiac output and oxygen saturation were measured using a Swan Ganz catheter placed through right femoral vein. Left coronary angiography was done using a Judkins L4 guide catheter.  Right coronary angiography was done using a Judkins R4 guide catheter. Left ventriculography was done using a pigtail catheter.    CONTRAST:  Total of 40 cc.  COMPLICATIONS:  None.  At the end of the procedure a manual pressure device was used for hemostasis.    HEMODYNAMICS:  Pulmonary pressure was 42/11: Wedge pressure was : RV pressure was 46/8: RA pressure was 11/7.   Oxygen saturation of blood sample from PA was 62 and AO was 90.  Aortic pressure was 193/108; LV pressure was 199/9; LVEDP 19.  There was no gradient between the left ventricle and aorta.    Cardiac output was 2.91 L/min by Fick method and 2.02 L/Min by Thermodilution.  ANGIOGRAM/CORONARY ARTERIOGRAM:   The left main coronary artery is unremarkable.  The left anterior descending artery wraps around the apex of the heart and has luminal irregularities in proximal half. Diagonal vessel is unremarkable.  The left circumflex artery is unremarkable.  The right coronary artery is dominant and unremarkable.  LEFT VENTRICULOGRAM:  Left ventricular angiogram was not done to conserve dye use. She had moderate LVH and 65 -70 % Ef by echocardiogram on 12/10/2013.  LVEDP was  19 mmHg.  IMPRESSION OF HEART CATHETERIZATION:   1. Normal left main coronary artery. 2. Minimal disease of left anterior descending artery and normal branches. 3. Normal left circumflex artery and its branches. 4. Normal right coronary artery. 5. Normal left ventricular systolic function by echocardiogram.  LVEDP 19 mmHg.  Ej. fraction  65-70 %. 6. Normal/Elevated PA and wedge pressures.  RECOMMENDATION:   Medical treatment with life-style modification.

## 2014-03-31 NOTE — Progress Notes (Signed)
Rapid response nurse at bedside.  Dirk Dress 03/31/2014 11:06 AM

## 2014-03-31 NOTE — Plan of Care (Signed)
Problem: Phase I Progression Outcomes Goal: Aspirin unless contraindicated Outcome: Completed/Met Date Met:  03/31/14 Per pt took ASA at home prior to arrival.  Problem: Phase II Progression Outcomes Goal: Cath/PCI Day Path if indicated Outcome: Progressing Planned cath today 03/31/2014

## 2014-03-31 NOTE — Progress Notes (Addendum)
Patient discharged to home.  Patient alert, oriented, verbally responsive, breathing regular and non-labored throughout, no s/s of distress noted throughout, no c/o pain throughout.  Discharge instructions verbalized to patient and family member at bedside.  Both verbalized understanding throughout.  Patient left unit per wheelchair accompanied by Cloretta Ned RN and family members.  VS WNL.  Dirk Dress 03/31/2014 2:57 PM

## 2014-03-31 NOTE — Progress Notes (Signed)
ANTICOAGULATION CONSULT NOTE - Follow Up Consult  Pharmacy Consult for Heparin  Indication: chest pain/ACS  No Known Allergies  Patient Measurements: Height: 5' 4"  (162.6 cm) Weight: 267 lb (121.11 kg) IBW/kg (Calculated) : 54.7 Heparin Dosing Weight: ~84 kg  Vital Signs: Temp: 98.4 F (36.9 C) (06/11 1901) Temp src: Oral (06/11 1901) BP: 138/81 mmHg (06/11 1901) Pulse Rate: 84 (06/11 1901)  Labs:  Recent Labs  03/30/14 2151 03/31/14 0210 03/31/14 0340 03/31/14 0500  HGB 12.0  --   --  12.0  HCT 37.9  --   --  38.1  PLT 281  --   --  270  LABPROT  --   --  13.7  --   INR  --   --  1.07  --   HEPARINUNFRC  --   --  0.40  --   CREATININE 1.21*  --  1.16*  --   TROPONINI <0.30 <0.30  --   --     Estimated Creatinine Clearance: 73.6 ml/min (by C-G formula based on Cr of 1.16).  Medications:  Heparin 1150 units/hr  Assessment: 53 y/o F on heparin for CP. HL is 0.4, other labs as above.   Goal of Therapy:  Heparin level 0.3-0.7 units/ml Monitor platelets by anticoagulation protocol: Yes   Plan:  -Continue heparin at 1150 units/hr -1000 HL to confirm -Daily CBC/HL -Monitor for bleeding  Narda Bonds 03/31/2014,4:34 AM

## 2014-03-31 NOTE — Progress Notes (Signed)
Called in to patients room per CNA, patient experiencing a lot of pain as well as numbness within the right leg, which is the leg that cardiac cath was performed on patient this morning.  Patient states that her sensation in her right leg is decreased in comparison to her left leg.  Pulses are moderate +2 in right and left foot.  Slightly weaker pulse in the right foot in comparison to left foot.  Patient rates pain 10/10 mostly in her thigh but affecting her entire leg.  Dr. Doylene Canard informed of patients current symptoms and states that he will be coming to check on patient.  Rapid Response Nurse called and informed of situation.  Awaiting Rapid Response Nurse and Dr. Merrilee Jansky arrival.  Dirk Dress 03/31/2014

## 2014-04-12 ENCOUNTER — Ambulatory Visit (INDEPENDENT_AMBULATORY_CARE_PROVIDER_SITE_OTHER): Payer: No Typology Code available for payment source | Admitting: Family Medicine

## 2014-04-12 ENCOUNTER — Ambulatory Visit
Admission: RE | Admit: 2014-04-12 | Discharge: 2014-04-12 | Disposition: A | Discharge status: Home / Self Care | Payer: Medicaid Other | Source: Ambulatory Visit | Attending: Family Medicine | Admitting: Family Medicine

## 2014-04-12 VITALS — BP 122/83 | HR 79 | Temp 97.6°F | Resp 20 | Wt 261.0 lb

## 2014-04-12 DIAGNOSIS — M79609 Pain in unspecified limb: Secondary | ICD-10-CM

## 2014-04-12 DIAGNOSIS — M5441 Lumbago with sciatica, right side: Secondary | ICD-10-CM

## 2014-04-12 DIAGNOSIS — M5431 Sciatica, right side: Secondary | ICD-10-CM

## 2014-04-12 DIAGNOSIS — Z1211 Encounter for screening for malignant neoplasm of colon: Secondary | ICD-10-CM

## 2014-04-12 DIAGNOSIS — M79651 Pain in right thigh: Secondary | ICD-10-CM

## 2014-04-12 DIAGNOSIS — M5430 Sciatica, unspecified side: Secondary | ICD-10-CM

## 2014-04-12 MED ORDER — GABAPENTIN 100 MG PO CAPS: 100.0000 mg | ORAL_CAPSULE | Freq: Three times a day (TID) | ORAL | Status: DC

## 2014-04-12 NOTE — Assessment & Plan Note (Signed)
Patient with chronic back pain with likely disc protrusion given sciatica. No red flags on history or exam. Will treat with scheduled tylenol 500 mg TID, gabapentin 100 mg TID, and will refer to PT. Also will obtain lumbar spinal XR given chronicity of pain. Given return precautions. Will see in follow-up in one month.

## 2014-04-12 NOTE — Patient Instructions (Signed)
Nice to see you. It sounds like you have a pinched nerve in your back. We will see if physical therapy will help with this. If this is too expensive please let us know. Please take tylenol 500 mg three times daily and gabapentin 100 mg three times daily to help with this pain. If you develop weakness, numbness, or change in bowel or bladder function please seek medical attention.

## 2014-04-12 NOTE — Progress Notes (Signed)
Patient ID: Hailey Barnes, female   DOB: 10-Jan-1961, 53 y.o.   MRN: [id number]  Hailey Rumps, MD Phone: [phone]  Hailey Barnes is a 53 y.o. female who presents today for f/u.  Back pain: notes this has been going on for 11 years. She first hurt it when lifting her father. It has mostly been on the right side. There is radiation down the back of her leg with burning, shock, and sharp pains. I previously gave her back exercises, though these were not helpful. She endorses weakness in the left leg and numbness in the left thigh. She denies fevers, history of cancer, saddle anesthesia, changes in urinary or bowel habits.  Right leg pain: notes this started immediately after her heart cath. It feels like a spasm. She thinks they hit a nerve. Starts in her groin and shoots down her thigh. Feels like it is down to the bone. Has been improving in terms of frequency. Lasts less than 30 minutes. She discussed this with Dr Doylene Canard and she states he told her that the area is inflamed and should get better within a month of the cath. He gave her celebrex which helped with the pain.  PMH: no history of cancer   ROS: Per HPI   Physical Exam Filed Vitals:   04/12/14 1007  BP: 122/83  Pulse: 79  Temp: 97.6 F (36.4 C)  Resp: 20    Gen: Well NAD HEENT: PERRL,  MMM Lungs: CTABL Nl WOB Heart: RRR no MRG MSK: tenderness to palpation of the bilateral lumbar paraspinous muscles, no midline tenderness, no swelling, positive sitting straight leg raise on the right, mild tenderness of the right inguinal area where cath was done, no palpable defects noted Neuro: 5/5 strength in bilateral hip flexors, quads, hamstrings, plantar and dorsiflexion, sensation to light touch intact in bilateral LE, patellar reflexes absent bilaterally Exts: Non edematous BL  LE, warm and well perfused.    Assessment/Plan: Please see individual problem list.  # Healthcare maintenance: given stool cards  and info for mammogram

## 2014-04-12 NOTE — Assessment & Plan Note (Signed)
Started after cath in right groin. Has been improving in frequency though not severity. Potentially could be related irritated nerve from her cath vs disc protrusion in back. Hopefully the gabapentin prescribed for her sciatica and scheduled tylenol will help with this. Given her HF history I do not think NSAIDs (celebrex) are a good option for treatment of her pain. Will continue to monitor. F/u in one month.

## 2014-04-14 ENCOUNTER — Ambulatory Visit (HOSPITAL_BASED_OUTPATIENT_CLINIC_OR_DEPARTMENT_OTHER): Payer: Medicaid Other | Attending: Family Medicine

## 2014-04-14 VITALS — Ht 64.0 in | Wt 260.0 lb

## 2014-04-14 DIAGNOSIS — G4710 Hypersomnia, unspecified: Secondary | ICD-10-CM | POA: Diagnosis present

## 2014-04-14 DIAGNOSIS — G4733 Obstructive sleep apnea (adult) (pediatric): Secondary | ICD-10-CM | POA: Diagnosis not present

## 2014-04-14 DIAGNOSIS — G4730 Sleep apnea, unspecified: Secondary | ICD-10-CM | POA: Diagnosis present

## 2014-04-14 DIAGNOSIS — I10 Essential (primary) hypertension: Secondary | ICD-10-CM

## 2014-04-22 DIAGNOSIS — I10 Essential (primary) hypertension: Secondary | ICD-10-CM

## 2014-04-22 NOTE — Sleep Study (Signed)
   NAME: Hailey Barnes DATE OF BIRTH:  1961/06/20 MEDICAL RECORD NUMBER [id number]  LOCATION: [facility]  PHYSICIAN: Ekam Besson D  DATE OF STUDY: 04/14/2014  SLEEP STUDY TYPE: Nocturnal Polysomnogram               REFERRING PHYSICIAN: Hairford, Tyler Pita, MD  INDICATION FOR STUDY: Hypersomnia with sleep apnea  EPWORTH SLEEPINESS SCORE:   20/24 HEIGHT: 5' 4"  (162.6 cm)  WEIGHT: 260 lb (117.935 kg)    Body mass index is 44.61 kg/(m^2).  NECK SIZE: 15 in.  MEDICATIONS: Charted for review  SLEEP ARCHITECTURE: Total sleep time 305.5 minutes with sleep efficiency 82.1%. Stage I was 2.6%, stage II 62.2%, stage III 0.8%, REM 34.4% of total sleep time. Sleep latency 82.1 minutes, REM latency 14.5 minutes, awake after sleep onset 38 minutes, arousal index 1.6. Bedtime medication: None  RESPIRATORY DATA: Apnea hypopneas index (AHI) 27.7 per hour. 141 total events scored including 32 obstructive apneas, 3 mixed apneas, 106 hypopneas. All events were nonsupine. REM AHI 58.9 per hour. The technician reported patient did not have enough early sleep with events to meet protocol requirements for split CPAP titration.  OXYGEN DATA: Very loud snoring with oxygen desaturation to a nadir of 68% and a mean saturation through the study of 90.5% on room air.  CARDIAC DATA: Normal sinus rhythm with occasional PVC  MOVEMENT/PARASOMNIA: No significant movement disturbance, bathroom x1  IMPRESSION/ RECOMMENDATION:  1) Moderate obstructive sleep apnea/hypopneas syndrome, AHI 27.7 per hour with nonsupine events. REM AHI 58.9 per hour. Very loud snoring with oxygen desaturation to a nadir of 68% and a mean saturation through the study of 90.5% on room air. 2) The technician was unable to do a split protocol CPAP titration. This patient can return for dedicated CPAP titration study if appropriate.   Signed Baird Lyons M.D. Deneise Lever Diplomate, American Board of Sleep  Medicine  ELECTRONICALLY SIGNED ON:  04/22/2014, 10:31 AM [place] PH: [phone]   FX: [phone] [place]

## 2014-04-26 ENCOUNTER — Encounter: Payer: Self-pay | Admitting: Family Medicine

## 2014-04-26 ENCOUNTER — Telehealth: Payer: Self-pay | Admitting: *Deleted

## 2014-04-26 ENCOUNTER — Ambulatory Visit: Payer: Medicaid Other | Attending: Family Medicine

## 2014-04-26 ENCOUNTER — Other Ambulatory Visit: Payer: Self-pay | Admitting: Family Medicine

## 2014-04-26 DIAGNOSIS — M545 Low back pain, unspecified: Secondary | ICD-10-CM | POA: Insufficient documentation

## 2014-04-26 DIAGNOSIS — M25559 Pain in unspecified hip: Secondary | ICD-10-CM | POA: Insufficient documentation

## 2014-04-26 MED ORDER — TIOTROPIUM BROMIDE MONOHYDRATE 18 MCG IN CAPS: 18.0000 ug | ORAL_CAPSULE | Freq: Every day | RESPIRATORY_TRACT | Status: DC

## 2014-04-26 MED ORDER — MOMETASONE FURO-FORMOTEROL FUM 200-5 MCG/ACT IN AERO: 2.0000 | INHALATION_SPRAY | Freq: Two times a day (BID) | RESPIRATORY_TRACT | Status: DC

## 2014-04-26 MED ORDER — ALBUTEROL SULFATE HFA 108 (90 BASE) MCG/ACT IN AERS: 2.0000 | INHALATION_SPRAY | Freq: Four times a day (QID) | RESPIRATORY_TRACT | Status: DC | PRN

## 2014-04-26 NOTE — Telephone Encounter (Signed)
Pt informed.  She wants MD to know that the tylenol is not helping, and the gabapentin makes her sleepy, but when she wakes back up she is still hurting. Hailey Barnes, Hailey Barnes

## 2014-04-26 NOTE — Telephone Encounter (Signed)
Message copied by Bobbye Riggs on Wed Apr 26, 2014 10:47 AM ------      Message from: Caryl Bis, ERIC G      Created: Wed Apr 26, 2014  9:24 AM       Please let the patient know that her x-ray revealed arthritis in her low back. We will continue to follow this and will try to manage her pain. Thanks. ------

## 2014-04-27 ENCOUNTER — Ambulatory Visit: Payer: Medicaid Other | Admitting: Physical Therapy

## 2014-04-27 DIAGNOSIS — M545 Low back pain, unspecified: Secondary | ICD-10-CM | POA: Diagnosis not present

## 2014-04-27 LAB — POC HEMOCCULT BLD/STL (HOME/3-CARD/SCREEN)
FECAL OCCULT BLD: NEGATIVE
FECAL OCCULT BLD: NEGATIVE
FECAL OCCULT BLD: NEGATIVE

## 2014-04-27 NOTE — Addendum Note (Signed)
Addended by: [place], Reynolds Kittel on: 04/27/2014 05:44 PM   Modules accepted: Orders

## 2014-04-28 ENCOUNTER — Encounter: Payer: Self-pay | Admitting: Family Medicine

## 2014-05-01 NOTE — Telephone Encounter (Signed)
Spoke with pt, she would like to try a different medication.     Of note,  During conversation pt tells me that she is having some swelling in all extremities, a little chest pain and SOB.  Since pt has history CHF advised to go to emergency room now.  Pt agreeable. Hailey Barnes, Hailey Barnes Spotted

## 2014-05-01 NOTE — Telephone Encounter (Signed)
Please inform the patient there are other medications we could try if she desires. If she wants I can send one of these in for her. Please let me know. Thanks.

## 2014-05-02 ENCOUNTER — Emergency Department (HOSPITAL_COMMUNITY)
Admission: EM | Admit: 2014-05-02 | Discharge: 2014-05-02 | Disposition: A | Discharge status: Home / Self Care | Payer: Medicaid Other | Attending: Emergency Medicine | Admitting: Emergency Medicine

## 2014-05-02 ENCOUNTER — Emergency Department (HOSPITAL_COMMUNITY): Payer: Medicaid Other

## 2014-05-02 ENCOUNTER — Ambulatory Visit: Payer: No Typology Code available for payment source

## 2014-05-02 ENCOUNTER — Encounter (HOSPITAL_COMMUNITY): Payer: Self-pay | Admitting: Emergency Medicine

## 2014-05-02 DIAGNOSIS — Z79899 Other long term (current) drug therapy: Secondary | ICD-10-CM | POA: Insufficient documentation

## 2014-05-02 DIAGNOSIS — G8929 Other chronic pain: Secondary | ICD-10-CM | POA: Diagnosis not present

## 2014-05-02 DIAGNOSIS — R059 Cough, unspecified: Secondary | ICD-10-CM | POA: Diagnosis present

## 2014-05-02 DIAGNOSIS — Z8673 Personal history of transient ischemic attack (TIA), and cerebral infarction without residual deficits: Secondary | ICD-10-CM | POA: Diagnosis not present

## 2014-05-02 DIAGNOSIS — F17200 Nicotine dependence, unspecified, uncomplicated: Secondary | ICD-10-CM | POA: Insufficient documentation

## 2014-05-02 DIAGNOSIS — Z8719 Personal history of other diseases of the digestive system: Secondary | ICD-10-CM | POA: Diagnosis not present

## 2014-05-02 DIAGNOSIS — J441 Chronic obstructive pulmonary disease with (acute) exacerbation: Secondary | ICD-10-CM | POA: Insufficient documentation

## 2014-05-02 DIAGNOSIS — M1710 Unilateral primary osteoarthritis, unspecified knee: Secondary | ICD-10-CM | POA: Diagnosis not present

## 2014-05-02 DIAGNOSIS — Z7982 Long term (current) use of aspirin: Secondary | ICD-10-CM | POA: Insufficient documentation

## 2014-05-02 DIAGNOSIS — I10 Essential (primary) hypertension: Secondary | ICD-10-CM | POA: Insufficient documentation

## 2014-05-02 DIAGNOSIS — R011 Cardiac murmur, unspecified: Secondary | ICD-10-CM | POA: Insufficient documentation

## 2014-05-02 DIAGNOSIS — IMO0002 Reserved for concepts with insufficient information to code with codable children: Secondary | ICD-10-CM | POA: Insufficient documentation

## 2014-05-02 DIAGNOSIS — I509 Heart failure, unspecified: Secondary | ICD-10-CM | POA: Insufficient documentation

## 2014-05-02 DIAGNOSIS — R05 Cough: Secondary | ICD-10-CM | POA: Diagnosis present

## 2014-05-02 DIAGNOSIS — G43909 Migraine, unspecified, not intractable, without status migrainosus: Secondary | ICD-10-CM | POA: Diagnosis not present

## 2014-05-02 LAB — CBC
HCT: 39.8 % (ref 36.0–46.0)
Hemoglobin: 12.6 g/dL (ref 12.0–15.0)
MCH: 24.6 pg — AB (ref 26.0–34.0)
MCHC: 31.7 g/dL (ref 30.0–36.0)
MCV: 77.6 fL — ABNORMAL LOW (ref 78.0–100.0)
PLATELETS: 249 10*3/uL (ref 150–400)
RBC: 5.13 MIL/uL — ABNORMAL HIGH (ref 3.87–5.11)
RDW: 18.8 % — AB (ref 11.5–15.5)
WBC: 11 10*3/uL — ABNORMAL HIGH (ref 4.0–10.5)

## 2014-05-02 LAB — BASIC METABOLIC PANEL
ANION GAP: 15 (ref 5–15)
BUN: 20 mg/dL (ref 6–23)
CALCIUM: 9.5 mg/dL (ref 8.4–10.5)
CO2: 25 mEq/L (ref 19–32)
Chloride: 101 mEq/L (ref 96–112)
Creatinine, Ser: 1.12 mg/dL — ABNORMAL HIGH (ref 0.50–1.10)
GFR calc Af Amer: 65 mL/min — ABNORMAL LOW (ref >90)
GFR calc non Af Amer: 56 mL/min — ABNORMAL LOW (ref >90)
GLUCOSE: 99 mg/dL (ref 70–99)
Potassium: 4.2 mEq/L (ref 3.7–5.3)
SODIUM: 141 meq/L (ref 137–147)

## 2014-05-02 LAB — I-STAT TROPONIN, ED: Troponin i, poc: 0.01 ng/mL (ref 0.00–0.08)

## 2014-05-02 LAB — D-DIMER, QUANTITATIVE: D-Dimer, Quant: 0.45 ug/mL-FEU (ref 0.00–0.48)

## 2014-05-02 LAB — PRO B NATRIURETIC PEPTIDE: Pro B Natriuretic peptide (BNP): 51.8 pg/mL (ref 0–125)

## 2014-05-02 MED ORDER — IPRATROPIUM BROMIDE 0.02 % IN SOLN
0.5000 mg | Freq: Once | RESPIRATORY_TRACT | Status: AC
  Administered 2014-05-02: 0.5 mg via RESPIRATORY_TRACT
  Filled 2014-05-02: qty 2.5

## 2014-05-02 MED ORDER — ALBUTEROL SULFATE (2.5 MG/3ML) 0.083% IN NEBU: 2.5000 mg | INHALATION_SOLUTION | Freq: Four times a day (QID) | RESPIRATORY_TRACT | Status: DC | PRN

## 2014-05-02 MED ORDER — ALBUTEROL SULFATE (2.5 MG/3ML) 0.083% IN NEBU
5.0000 mg | INHALATION_SOLUTION | Freq: Once | RESPIRATORY_TRACT | Status: AC
  Administered 2014-05-02: 5 mg via RESPIRATORY_TRACT
  Filled 2014-05-02: qty 6

## 2014-05-02 MED ORDER — PREDNISONE 20 MG PO TABS
60.0000 mg | ORAL_TABLET | Freq: Once | ORAL | Status: AC
  Administered 2014-05-02: 60 mg via ORAL
  Filled 2014-05-02: qty 3

## 2014-05-02 MED ORDER — PREDNISONE 20 MG PO TABS: 60.0000 mg | ORAL_TABLET | Freq: Once | ORAL | Status: DC

## 2014-05-02 NOTE — Telephone Encounter (Signed)
Called the patient back as there was no note from the ED. She continues to endorse a little chest pain and shortness of breath. She notes she did not go to the ED last night due to lack of transportation. I advised her that she needs to come to the ED to be evaluated. She notes she is going to have her sister come get her today to bring her to the ED this morning.

## 2014-05-02 NOTE — ED Provider Notes (Signed)
CSN: [id number]     Arrival date & time 05/02/14  2008 History   First MD Initiated Contact with Patient 05/02/14 2052     Chief Complaint  Patient presents with  . Congestive Heart Failure     (Consider location/radiation/quality/duration/timing/severity/associated sxs/prior Treatment) Patient is a 53 y.o. female presenting with CHF.  Congestive Heart Failure Associated symptoms include chest pain and coughing. Pertinent negatives include no chills, diaphoresis, fever, headaches, numbness or weakness.    Patient is 53 yo female with hx of CHF, HTN, COPD who comes in with 2 days of SOB and chest tightness. Chest tightness is constant, 6/10, radiates to under her breasts bilaterally. Doesn't change with position. SOB is worse with lying flat and exertion. She has bilateral leg swelling, orthopnea, and nocturnal cough. These symptoms are similar to her last CHF admission.   Denies any fever/chills/n/v/diarrhea/constipation/dysuria. No sick contacts. Has been compliant with all of her meds. She has been trying to lose weight and eat healthy. Plans to quit smoking in the near future.  Past Medical History  Diagnosis Date  . 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   Marland Kitchen Hypertension   . COPD (chronic obstructive pulmonary disease)   . Tingling in extremities 12/12/2010    Uric acid and electrolytes (02/23) normal but WBC elevated.   D/Dx: carpal tunnel, ulnar neuropathy Less likely: cervical radiculopathy, vasculitis   . CHF (congestive heart failure)   . Chronic bronchitis     "get it q yr" (02/21/2014)  . Arthritis     "knees" (02/21/2014)  . Complication of anesthesia     "I don't come out well; I chew on my tongue"  . Heart murmur   . GERD (gastroesophageal reflux disease)   . Migraine 1982-2009  . Stroke noted on CAT 02/2014    "light", LLE weakness remains (03/30/2014)  . Chronic lower back pain    Past Surgical History  Procedure Laterality Date  .  Cesarean section  1982  . Foot surgery Bilateral     "took bones out; put pins in"   Family History  Problem Relation Age of Onset  . Hypertension Mother   . Hypertension Father    History  Substance Use Topics  . Smoking status: Current Every Day Smoker -- 0.50 packs/day for 37 years    Types: Cigarettes  . Smokeless tobacco: Never Used  . Alcohol Use: No   OB History   Grav Para Term Preterm Abortions TAB SAB Ect Mult Living   1 1        1      Review of Systems  Constitutional: Negative for fever, chills and diaphoresis.  Eyes: Negative.   Respiratory: Positive for cough, chest tightness, shortness of breath and wheezing.   Cardiovascular: Positive for chest pain and leg swelling. Negative for palpitations.  Gastrointestinal: Negative.   Endocrine: Negative.   Genitourinary: Negative.   Musculoskeletal: Negative.   Skin: Negative.   Allergic/Immunologic: Negative.   Neurological: Negative for dizziness, tremors, seizures, syncope, weakness, numbness and headaches.  Hematological: Negative.   Psychiatric/Behavioral: Negative.       Allergies  Review of patient's allergies indicates no known allergies.  Home Medications   Prior to Admission medications   Medication Sig Start Date End Date Taking? Authorizing Provider  albuterol (PROVENTIL HFA;VENTOLIN HFA) 108 (90 BASE) MCG/ACT inhaler Inhale 2 puffs into the lungs every 6 (six) hours as needed for wheezing or shortness of breath.  Yes Historical Provider, MD  amLODipine (NORVASC) 5 MG tablet Take 5 mg by mouth daily.   Yes Historical Provider, MD  aspirin 81 MG tablet Take 81 mg by mouth daily.   Yes Historical Provider, MD  carvedilol (COREG) 25 MG tablet Take 25 mg by mouth 2 (two) times daily with a meal.   Yes Historical Provider, MD  furosemide (LASIX) 40 MG tablet Take 40 mg by mouth daily.   Yes Historical Provider, MD  gabapentin (NEURONTIN) 100 MG capsule Take 100 mg by mouth 3 (three) times daily.   Yes  Historical Provider, MD  ibuprofen (ADVIL,MOTRIN) 200 MG tablet Take 400 mg by mouth every 6 (six) hours as needed (pain).   Yes Historical Provider, MD  mometasone-formoterol (DULERA) 200-5 MCG/ACT AERO Inhale 2 puffs into the lungs 2 (two) times daily.   Yes Historical Provider, MD  spironolactone (ALDACTONE) 25 MG tablet Take 25 mg by mouth daily.   Yes Historical Provider, MD  tiotropium (SPIRIVA) 18 MCG inhalation capsule Place 18 mcg into inhaler and inhale daily.   Yes Historical Provider, MD  acetaminophen (TYLENOL) 500 MG tablet Take 1 tablet (500 mg total) by mouth 3 (three) times daily. 03/09/14   Zigmund Gottron, MD  albuterol (PROVENTIL) (2.5 MG/3ML) 0.083% nebulizer solution Take 3 mLs (2.5 mg total) by nebulization every 6 (six) hours as needed for wheezing or shortness of breath. 05/02/14   Antwoin Lackey, MD  predniSONE (DELTASONE) 20 MG tablet Take 3 tablets (60 mg total) by mouth once. 05/02/14   Ihan Pat, MD   BP 163/76  Pulse 70  Temp(Src) 98.2 F (36.8 C) (Oral)  Resp 19  Ht 5' 4"  (1.626 m)  Wt 268 lb (121.564 kg)  BMI 45.98 kg/m2  SpO2 98%  LMP 05/02/2014 Physical Exam  Constitutional: She is oriented to person, place, and time. She appears well-developed and well-nourished.  HENT:  Head: Normocephalic and atraumatic.  Right Ear: External ear normal.  Left Ear: External ear normal.  Mouth/Throat: Oropharynx is clear and moist.  Eyes: Conjunctivae and EOM are normal. Pupils are equal, round, and reactive to light.  Neck: Normal range of motion. Neck supple. JVD present. No thyromegaly present.  Cardiovascular: Normal rate, regular rhythm, S1 normal, S2 normal and normal heart sounds.  Exam reveals no gallop, no S3, no S4 and no friction rub.   No murmur heard. Pulmonary/Chest: She is in respiratory distress. She has no wheezes. She has no rales. She exhibits no tenderness.  Some mild respiratory distress noted (without accessory muscle use) when lying flat.    Musculoskeletal: Normal range of motion.  1+ pitting edema noted on bilateral lower extremities upto her shins.   Lymphadenopathy:    She has no cervical adenopathy.  Neurological: She is alert and oriented to person, place, and time. She has normal strength. No cranial nerve deficit or sensory deficit.    ED Course  Procedures (including critical care time) Labs Review Labs Reviewed  CBC - Abnormal; Notable for the following:    WBC 11.0 (*)    RBC 5.13 (*)    MCV 77.6 (*)    MCH 24.6 (*)    RDW 18.8 (*)    All other components within normal limits  BASIC METABOLIC PANEL - Abnormal; Notable for the following:    Creatinine, Ser 1.12 (*)    GFR calc non Af Amer 56 (*)    GFR calc Af Amer 65 (*)    All other components within  normal limits  PRO B NATRIURETIC PEPTIDE  D-DIMER, QUANTITATIVE  I-STAT TROPOININ, ED  Randolm Idol, ED    Imaging Review Dg Chest 2 View  05/02/2014   CLINICAL DATA:  Congestive failure  EXAM: CHEST  2 VIEW  COMPARISON:  02/21/2014  FINDINGS: Cardiac shadow is stable. The lungs are well-aerated without focal infiltrate or sizable effusion. No acute bony abnormality is seen.  IMPRESSION: No acute abnormality noted.   Electronically Signed   By: Inez Catalina M.D.   On: 05/02/2014 21:37     EKG Interpretation   Date/Time:  Tuesday May 02 2014 20:15:36 EDT Ventricular Rate:  77 PR Interval:  152 QRS Duration: 82 QT Interval:  408 QTC Calculation: 461 R Axis:   13 Text Interpretation:  Normal sinus rhythm Low voltage QRS Cannot rule out  Anterior infarct , age undetermined Abnormal ECG No significant change was  found Confirmed by CAMPOS  MD, KEVIN ([id number]) on 05/02/2014 9:41:33 PM     Trop negative. Crt elevated mildy 1.12 but lower than 1.21 1 month ago.  CXR negative.  Will get d-dimer (low risk wells for PE but can't rule out PERC).   Patient mentioned she has been wheezing at home and it gets better with her inhalers. Last used inhaler  this morning. Will do a neb treatment here. Also will start PO steroid here.  D-dimer was negative. Patient feels her SOB and chest tightness improved after the neb treatment. MDM   Final diagnoses:  COPD with acute exacerbation   Patient's SOB is likely due to COPD exacerbation. PE ruled out by neg dimer. ACS unlikely given neg trp. She is not in severe respiratory distress, sats are high 90's at room air. Doesn't have increased WOB or CP with ambulation. Will treat her copd exacerbation as outpatient. Will do 5day course prednisone. Patient request nebulizer at home - wrote for it. Asked to f/up with PCP.    Dellia Nims, MD 05/02/14 7416

## 2014-05-02 NOTE — Telephone Encounter (Signed)
Discussed with Christen Bame. Advised that patient needs to come to the ED. Patient reluctant to call EMS at this time. Advised that patient needs to be seen in ED as soon as possible. Hopefully patient will come to the ED when her sister can pick her up.

## 2014-05-02 NOTE — ED Notes (Signed)
Pt belongings with pt and family member at dc time.

## 2014-05-02 NOTE — Telephone Encounter (Signed)
Called patient to followup on status of ride.  She states that her sister will come pick her up after work which is at [employer].  Pt states that she feels about the same.  Pt is reluctant to call ambulance because of the cost, but advised if she starts to feel worse in anyway we strongly urge her to call 911 and be taken to the ED.  Pt agreeable. [place], Salome Spotted

## 2014-05-02 NOTE — Discharge Instructions (Signed)
Please take prednisone 3 tablets once daily with food for 5 days. Continue to use your inhalers at home as you were.  Follow up with your primary care doctor.

## 2014-05-02 NOTE — ED Notes (Signed)
Pt states she has been having some fluid buildup for a couple days that has gotten worse yesterday.  Pt states some chest pressure and SOB

## 2014-05-03 ENCOUNTER — Ambulatory Visit: Payer: Medicaid Other | Admitting: Rehabilitation

## 2014-05-03 LAB — I-STAT TROPONIN, ED: Troponin i, poc: 0 ng/mL (ref 0.00–0.08)

## 2014-05-06 NOTE — ED Provider Notes (Signed)
I saw and evaluated the patient, reviewed the resident's note and I agree with the findings and plan.   EKG Interpretation   Date/Time:  Tuesday May 02 2014 20:15:36 EDT Ventricular Rate:  77 PR Interval:  152 QRS Duration: 82 QT Interval:  408 QTC Calculation: 461 R Axis:   13 Text Interpretation:  Normal sinus rhythm Low voltage QRS Cannot rule out  Anterior infarct , age undetermined Abnormal ECG No significant change was  found Confirmed by Jazzlynn Rawe  MD, Chassity Ludke ([id number]) on 05/02/2014 9:41:33 PM      Overall well appearing. D dimer negative. Doubt CHF. Improved with bronchodilators. Suspect reactive airway disease. Home with pcp follow up  Dg Chest 2 View  05/02/2014   CLINICAL DATA:  Congestive failure  EXAM: CHEST  2 VIEW  COMPARISON:  02/21/2014  FINDINGS: Cardiac shadow is stable. The lungs are well-aerated without focal infiltrate or sizable effusion. No acute bony abnormality is seen.  IMPRESSION: No acute abnormality noted.   Electronically Signed   By: Inez Catalina M.D.   On: 05/02/2014 21:37      Hoy Morn, MD 05/06/14 [phone]

## 2014-05-09 ENCOUNTER — Ambulatory Visit (INDEPENDENT_AMBULATORY_CARE_PROVIDER_SITE_OTHER): Payer: No Typology Code available for payment source | Admitting: Family Medicine

## 2014-05-09 ENCOUNTER — Encounter: Payer: Self-pay | Admitting: Family Medicine

## 2014-05-09 ENCOUNTER — Encounter: Payer: No Typology Code available for payment source | Admitting: Rehabilitation

## 2014-05-09 VITALS — BP 120/80 | HR 89 | Temp 97.6°F | Ht 64.0 in | Wt 266.0 lb

## 2014-05-09 DIAGNOSIS — F17200 Nicotine dependence, unspecified, uncomplicated: Secondary | ICD-10-CM

## 2014-05-09 DIAGNOSIS — J984 Other disorders of lung: Secondary | ICD-10-CM

## 2014-05-09 DIAGNOSIS — I519 Heart disease, unspecified: Secondary | ICD-10-CM

## 2014-05-09 DIAGNOSIS — I509 Heart failure, unspecified: Secondary | ICD-10-CM

## 2014-05-09 DIAGNOSIS — I5022 Chronic systolic (congestive) heart failure: Secondary | ICD-10-CM

## 2014-05-09 DIAGNOSIS — I5189 Other ill-defined heart diseases: Secondary | ICD-10-CM

## 2014-05-09 MED ORDER — TRIAMCINOLONE ACETONIDE 0.1 % EX CREA: 1.0000 "application " | TOPICAL_CREAM | Freq: Two times a day (BID) | CUTANEOUS | Status: DC

## 2014-05-09 MED ORDER — VARENICLINE TARTRATE 0.5 MG X 11 & 1 MG X 42 PO MISC: ORAL | Status: DC

## 2014-05-09 MED ORDER — FUROSEMIDE 40 MG PO TABS: 40.0000 mg | ORAL_TABLET | Freq: Every day | ORAL | Status: DC

## 2014-05-09 NOTE — Patient Instructions (Addendum)
Nice to see you. Please continue to use your inhalers each day for your COPD. You can use your albuterol as needed for shortness of breath. We are going to increase your lasix to 80 mg daily for the next 3 days. Then resume 40 mg daily. You need to limit the amount of liquid you are taking in to 2 L each day. We will try triamcinolone for your rash. You can use this for 5 days at a time, then take a 2 day break. Be cautious when using this on your face as it can change the color of your skin. I sent the chantix to the health department for you.

## 2014-05-09 NOTE — Progress Notes (Signed)
Patient ID: Hailey Barnes, female   DOB: [birth year], 53 y.o.   MRN: [id number]  Tommi Rumps, MD Phone: [phone]  Hailey Barnes is a 53 y.o. female who presents today for f/u.  COPD: Patient seen in the ED last week for COPD exacerbation. Started on prednisone and given albuterol inhaler. She reports she is breathing better at this time. She finished the course of prednisone. They did not treat her with antibiotics. She reports she is unsure how to use her spiriva. She reports it tastes different than when she was last hospitalized. No dyspnea or CP. She endorses wanting help to quit smoking and requests chantix as discussed at her visit with Dr Valentina Lucks at her last visit with him.  CHF: reports that while in the ED she was told her heart was ok. Her troponin was negative and her BNP was normal. The EDP told her to take 80 mg of lasix for 3 days. She did this and notes her swelling improved though came back after decreasing back to 40 mg daily. She denies dyspnea, though notes increasing orthopnea with 2-3 pillows. She weighs herself daily and notes that her weight had been 260, though has increased up to 266 today.  Eczema: notes she has had this her whole life, though has worsened in the past 2 months. She has it on her face under her eyes and at the corners of her mouth. She has been using her sisters desonide cream. This helps for a while then the rash returns after stopping the cream.   Patient is a smoker.   ROS: Per HPI   Physical Exam Filed Vitals:   05/09/14 1337  BP: 120/80  Pulse: 89  Temp: 97.6 F (36.4 C)    Gen: Well NAD HEENT: PERRL,  MMM Lungs: CTABL Nl WOB Heart: RRR no MRG Skin: hyperpigmented area under bilateral eyes and at corners of mouth Exts: 1+ edema BL  LE, warm and well perfused.    Assessment/Plan: Please see individual problem list.  # Healthcare maintenance: needs mammogram

## 2014-05-10 ENCOUNTER — Ambulatory Visit: Payer: Medicaid Other | Admitting: Rehabilitation

## 2014-05-10 DIAGNOSIS — M545 Low back pain, unspecified: Secondary | ICD-10-CM | POA: Diagnosis not present

## 2014-05-10 DIAGNOSIS — J984 Other disorders of lung: Secondary | ICD-10-CM | POA: Insufficient documentation

## 2014-05-10 LAB — BASIC METABOLIC PANEL
BUN: 16 mg/dL (ref 6–23)
CHLORIDE: 101 meq/L (ref 96–112)
CO2: 29 meq/L (ref 19–32)
Calcium: 9.6 mg/dL (ref 8.4–10.5)
Creat: 1.07 mg/dL (ref 0.50–1.10)
Glucose, Bld: 103 mg/dL — ABNORMAL HIGH (ref 70–99)
POTASSIUM: 4.2 meq/L (ref 3.5–5.3)
SODIUM: 139 meq/L (ref 135–145)

## 2014-05-10 NOTE — Assessment & Plan Note (Signed)
Patient is ready to quit and desires help with this. She would like to try chantix. Prescription given for this. Sent to health department.

## 2014-05-10 NOTE — Assessment & Plan Note (Signed)
Improved from recent exacerbation. Pharmacy teaching given on spiriva. Patient to continue spiriva, dulera, and albuterol prn. F/u in 1 month.

## 2014-05-10 NOTE — Assessment & Plan Note (Addendum)
Weight is up 6 pounds and with increasing orthopnea. Will increase lasix to 80 mg daily for 3 days. Patient is to weigh herself daily. Discussed low salt diet and limiting fluid intake to 2 L. F/u in one month or sooner if weight is not trending down. Will check BMET. Given return precautions.

## 2014-05-12 ENCOUNTER — Ambulatory Visit: Payer: Medicaid Other | Admitting: Physical Therapy

## 2014-05-12 DIAGNOSIS — M545 Low back pain, unspecified: Secondary | ICD-10-CM | POA: Diagnosis not present

## 2014-05-16 ENCOUNTER — Encounter: Payer: Self-pay | Admitting: Family Medicine

## 2014-06-05 ENCOUNTER — Other Ambulatory Visit: Payer: Self-pay | Admitting: Family Medicine

## 2014-06-05 DIAGNOSIS — G4733 Obstructive sleep apnea (adult) (pediatric): Secondary | ICD-10-CM

## 2014-06-05 NOTE — Progress Notes (Signed)
The patient had a sleep study that revealed OSA, though they were not able to complete the split night study to perform titration of CPAP settings for the patient. I placed another order for the patient to have this done. Could you please inform her of this. Thanks.

## 2014-06-07 ENCOUNTER — Encounter: Payer: Self-pay | Admitting: Family Medicine

## 2014-06-07 ENCOUNTER — Inpatient Hospital Stay (HOSPITAL_COMMUNITY)
Admission: EM | Admit: 2014-06-07 | Discharge: 2014-06-09 | DRG: 292 | Disposition: A | Discharge status: Home / Self Care | Payer: Medicaid Other | Attending: Family Medicine | Admitting: Family Medicine

## 2014-06-07 ENCOUNTER — Encounter (HOSPITAL_COMMUNITY): Payer: Self-pay | Admitting: Emergency Medicine

## 2014-06-07 ENCOUNTER — Emergency Department (HOSPITAL_COMMUNITY): Payer: Medicaid Other

## 2014-06-07 ENCOUNTER — Ambulatory Visit (HOSPITAL_COMMUNITY)
Admission: RE | Admit: 2014-06-07 | Discharge: 2014-06-07 | Disposition: A | Discharge status: Home / Self Care | Payer: Medicaid Other | Source: Ambulatory Visit | Attending: Family Medicine | Admitting: Family Medicine

## 2014-06-07 ENCOUNTER — Ambulatory Visit (INDEPENDENT_AMBULATORY_CARE_PROVIDER_SITE_OTHER): Payer: No Typology Code available for payment source | Admitting: Family Medicine

## 2014-06-07 ENCOUNTER — Inpatient Hospital Stay (HOSPITAL_COMMUNITY): Admission: AD | Admit: 2014-06-07 | Payer: MEDICAID | Source: Ambulatory Visit | Admitting: Family Medicine

## 2014-06-07 VITALS — BP 153/109 | HR 76 | Temp 98.1°F | Wt 272.0 lb

## 2014-06-07 DIAGNOSIS — M1710 Unilateral primary osteoarthritis, unspecified knee: Secondary | ICD-10-CM | POA: Diagnosis present

## 2014-06-07 DIAGNOSIS — I5033 Acute on chronic diastolic (congestive) heart failure: Principal | ICD-10-CM | POA: Diagnosis present

## 2014-06-07 DIAGNOSIS — R0602 Shortness of breath: Secondary | ICD-10-CM

## 2014-06-07 DIAGNOSIS — K219 Gastro-esophageal reflux disease without esophagitis: Secondary | ICD-10-CM | POA: Diagnosis present

## 2014-06-07 DIAGNOSIS — J984 Other disorders of lung: Secondary | ICD-10-CM | POA: Diagnosis present

## 2014-06-07 DIAGNOSIS — F17200 Nicotine dependence, unspecified, uncomplicated: Secondary | ICD-10-CM | POA: Diagnosis present

## 2014-06-07 DIAGNOSIS — Z7982 Long term (current) use of aspirin: Secondary | ICD-10-CM

## 2014-06-07 DIAGNOSIS — J449 Chronic obstructive pulmonary disease, unspecified: Secondary | ICD-10-CM

## 2014-06-07 DIAGNOSIS — I509 Heart failure, unspecified: Secondary | ICD-10-CM

## 2014-06-07 DIAGNOSIS — E669 Obesity, unspecified: Secondary | ICD-10-CM | POA: Diagnosis present

## 2014-06-07 DIAGNOSIS — IMO0002 Reserved for concepts with insufficient information to code with codable children: Secondary | ICD-10-CM

## 2014-06-07 DIAGNOSIS — M545 Low back pain, unspecified: Secondary | ICD-10-CM | POA: Diagnosis present

## 2014-06-07 DIAGNOSIS — I10 Essential (primary) hypertension: Secondary | ICD-10-CM

## 2014-06-07 DIAGNOSIS — G8929 Other chronic pain: Secondary | ICD-10-CM | POA: Diagnosis present

## 2014-06-07 DIAGNOSIS — R079 Chest pain, unspecified: Secondary | ICD-10-CM

## 2014-06-07 DIAGNOSIS — Z720 Tobacco use: Secondary | ICD-10-CM

## 2014-06-07 DIAGNOSIS — Z6841 Body Mass Index (BMI) 40.0 and over, adult: Secondary | ICD-10-CM | POA: Diagnosis not present

## 2014-06-07 DIAGNOSIS — J4489 Other specified chronic obstructive pulmonary disease: Secondary | ICD-10-CM

## 2014-06-07 DIAGNOSIS — K7689 Other specified diseases of liver: Secondary | ICD-10-CM | POA: Diagnosis present

## 2014-06-07 DIAGNOSIS — Z8249 Family history of ischemic heart disease and other diseases of the circulatory system: Secondary | ICD-10-CM | POA: Diagnosis not present

## 2014-06-07 DIAGNOSIS — R0789 Other chest pain: Secondary | ICD-10-CM

## 2014-06-07 HISTORY — DX: Shortness of breath: R06.02

## 2014-06-07 LAB — CBC
HEMATOCRIT: 42.7 % (ref 36.0–46.0)
HEMATOCRIT: 38.1 % (ref 36.0–46.0)
Hemoglobin: 13.6 g/dL (ref 12.0–15.0)
Hemoglobin: 12.5 g/dL (ref 12.0–15.0)
MCH: 25.2 pg — AB (ref 26.0–34.0)
MCH: 25.4 pg — ABNORMAL LOW (ref 26.0–34.0)
MCHC: 31.9 g/dL (ref 30.0–36.0)
MCHC: 32.8 g/dL (ref 30.0–36.0)
MCV: 79.1 fL (ref 78.0–100.0)
MCV: 77.4 fL — AB (ref 78.0–100.0)
Platelets: 234 10*3/uL (ref 150–400)
Platelets: 228 10*3/uL (ref 150–400)
RBC: 5.4 MIL/uL — AB (ref 3.87–5.11)
RBC: 4.92 MIL/uL (ref 3.87–5.11)
RDW: 17.7 % — ABNORMAL HIGH (ref 11.5–15.5)
RDW: 17.7 % — AB (ref 11.5–15.5)
WBC: 9.2 10*3/uL (ref 4.0–10.5)
WBC: 9.2 10*3/uL (ref 4.0–10.5)

## 2014-06-07 LAB — PRO B NATRIURETIC PEPTIDE: Pro B Natriuretic peptide (BNP): 56.1 pg/mL (ref 0–125)

## 2014-06-07 LAB — I-STAT TROPONIN, ED
Troponin i, poc: 0.01 ng/mL (ref 0.00–0.08)
Troponin i, poc: 0 ng/mL (ref 0.00–0.08)

## 2014-06-07 LAB — COMPREHENSIVE METABOLIC PANEL
ALT: 14 U/L (ref 0–35)
AST: 19 U/L (ref 0–37)
Albumin: 3.5 g/dL (ref 3.5–5.2)
Alkaline Phosphatase: 84 U/L (ref 39–117)
Anion gap: 13 (ref 5–15)
BUN: 17 mg/dL (ref 6–23)
CALCIUM: 9.5 mg/dL (ref 8.4–10.5)
CO2: 24 mEq/L (ref 19–32)
CREATININE: 1.09 mg/dL (ref 0.50–1.10)
Chloride: 100 mEq/L (ref 96–112)
GFR calc Af Amer: 67 mL/min — ABNORMAL LOW (ref >90)
GFR, EST NON AFRICAN AMERICAN: 58 mL/min — AB (ref >90)
Glucose, Bld: 93 mg/dL (ref 70–99)
Potassium: 3.8 mEq/L (ref 3.7–5.3)
Sodium: 137 mEq/L (ref 137–147)
Total Protein: 6.9 g/dL (ref 6.0–8.3)

## 2014-06-07 LAB — PROTIME-INR
INR: 1 (ref 0.00–1.49)
Prothrombin Time: 13.2 seconds (ref 11.6–15.2)

## 2014-06-07 LAB — POC URINE PREG, ED: PREG TEST UR: NEGATIVE

## 2014-06-07 LAB — APTT: aPTT: 27 seconds (ref 24–37)

## 2014-06-07 MED ORDER — NITROGLYCERIN 0.4 MG SL SUBL: 0.4000 mg | SUBLINGUAL_TABLET | SUBLINGUAL | Status: DC | PRN

## 2014-06-07 MED ORDER — SPIRONOLACTONE 25 MG PO TABS
25.0000 mg | ORAL_TABLET | Freq: Every day | ORAL | Status: DC
  Administered 2014-06-08 – 2014-06-09 (×2): 25 mg via ORAL
  Filled 2014-06-07 (×2): qty 1

## 2014-06-07 MED ORDER — GABAPENTIN 100 MG PO CAPS
100.0000 mg | ORAL_CAPSULE | Freq: Three times a day (TID) | ORAL | Status: DC
  Administered 2014-06-07 – 2014-06-09 (×5): 100 mg via ORAL
  Filled 2014-06-07 (×8): qty 1

## 2014-06-07 MED ORDER — ONDANSETRON HCL 4 MG PO TABS: 4.0000 mg | ORAL_TABLET | Freq: Four times a day (QID) | ORAL | Status: DC | PRN

## 2014-06-07 MED ORDER — ACETAMINOPHEN 325 MG PO TABS
650.0000 mg | ORAL_TABLET | Freq: Four times a day (QID) | ORAL | Status: DC | PRN
  Administered 2014-06-07 – 2014-06-08 (×4): 650 mg via ORAL
  Filled 2014-06-07 (×4): qty 2

## 2014-06-07 MED ORDER — ASPIRIN 81 MG PO CHEW
324.0000 mg | CHEWABLE_TABLET | Freq: Once | ORAL | Status: AC
  Administered 2014-06-07: 162 mg via ORAL
  Filled 2014-06-07: qty 4

## 2014-06-07 MED ORDER — MORPHINE SULFATE 2 MG/ML IJ SOLN: 1.0000 mg | INTRAMUSCULAR | Status: DC | PRN

## 2014-06-07 MED ORDER — SODIUM CHLORIDE 0.9 % IV SOLN: 250.0000 mL | INTRAVENOUS | Status: DC | PRN

## 2014-06-07 MED ORDER — SODIUM CHLORIDE 0.9 % IV SOLN: 1000.0000 mL | INTRAVENOUS | Status: DC

## 2014-06-07 MED ORDER — ASPIRIN EC 81 MG PO TBEC
81.0000 mg | DELAYED_RELEASE_TABLET | Freq: Every day | ORAL | Status: DC
  Administered 2014-06-08 – 2014-06-09 (×2): 81 mg via ORAL
  Filled 2014-06-07 (×2): qty 1

## 2014-06-07 MED ORDER — TIOTROPIUM BROMIDE MONOHYDRATE 18 MCG IN CAPS
18.0000 ug | ORAL_CAPSULE | Freq: Every day | RESPIRATORY_TRACT | Status: DC
  Administered 2014-06-08 – 2014-06-09 (×2): 18 ug via RESPIRATORY_TRACT
  Filled 2014-06-07: qty 5

## 2014-06-07 MED ORDER — SODIUM CHLORIDE 0.9 % IJ SOLN
3.0000 mL | Freq: Two times a day (BID) | INTRAMUSCULAR | Status: DC
  Administered 2014-06-07 – 2014-06-09 (×3): 3 mL via INTRAVENOUS

## 2014-06-07 MED ORDER — ALBUTEROL SULFATE (2.5 MG/3ML) 0.083% IN NEBU
3.0000 mL | INHALATION_SOLUTION | Freq: Two times a day (BID) | RESPIRATORY_TRACT | Status: DC
  Administered 2014-06-08 – 2014-06-09 (×3): 3 mL via RESPIRATORY_TRACT
  Filled 2014-06-07 (×3): qty 3

## 2014-06-07 MED ORDER — ONDANSETRON HCL 4 MG/2ML IJ SOLN: 4.0000 mg | Freq: Four times a day (QID) | INTRAMUSCULAR | Status: DC | PRN

## 2014-06-07 MED ORDER — MOMETASONE FURO-FORMOTEROL FUM 200-5 MCG/ACT IN AERO
2.0000 | INHALATION_SPRAY | Freq: Two times a day (BID) | RESPIRATORY_TRACT | Status: DC
  Administered 2014-06-08 – 2014-06-09 (×3): 2 via RESPIRATORY_TRACT
  Filled 2014-06-07: qty 8.8

## 2014-06-07 MED ORDER — SODIUM CHLORIDE 0.9 % IJ SOLN: 3.0000 mL | INTRAMUSCULAR | Status: DC | PRN

## 2014-06-07 MED ORDER — HEPARIN SODIUM (PORCINE) 5000 UNIT/ML IJ SOLN
5000.0000 [IU] | Freq: Three times a day (TID) | INTRAMUSCULAR | Status: DC
  Administered 2014-06-07 – 2014-06-09 (×5): 5000 [IU] via SUBCUTANEOUS
  Filled 2014-06-07 (×8): qty 1

## 2014-06-07 MED ORDER — NITROGLYCERIN 2 % TD OINT
1.0000 [in_us] | TOPICAL_OINTMENT | Freq: Once | TRANSDERMAL | Status: AC
  Administered 2014-06-07: 1 [in_us] via TOPICAL
  Filled 2014-06-07: qty 1

## 2014-06-07 MED ORDER — FUROSEMIDE 10 MG/ML IJ SOLN
160.0000 mg | Freq: Once | INTRAVENOUS | Status: AC
  Administered 2014-06-07: 160 mg via INTRAVENOUS
  Filled 2014-06-07: qty 16

## 2014-06-07 MED ORDER — CARVEDILOL 25 MG PO TABS
25.0000 mg | ORAL_TABLET | Freq: Two times a day (BID) | ORAL | Status: DC
  Administered 2014-06-08 (×2): 25 mg via ORAL
  Filled 2014-06-07 (×5): qty 1

## 2014-06-07 MED ORDER — ACETAMINOPHEN 650 MG RE SUPP
650.0000 mg | Freq: Four times a day (QID) | RECTAL | Status: DC | PRN
Start: 2014-06-07 — End: 2014-06-09

## 2014-06-07 NOTE — ED Notes (Signed)
Denies chest pain at present.  Only c/o headache.  Set for admission, awaiting room assignment.

## 2014-06-07 NOTE — ED Notes (Signed)
Pt reports cp since yesterday, describes as tightness in chest, reports SOB, skin warm and dry. 4/10 CP, no radiation. Was given 1 SL  Nitro with no relief, reports she does not want narcotics, is in recovery.

## 2014-06-07 NOTE — Assessment & Plan Note (Signed)
Patient presents with CHF exacerbation that has not responded to home PO lasix. She appears quite fatigued in the office and short of breath on ambulation and with chest pain, so the decision was made to admit her for inpatient diuresis and cycling of cardiac enzymes. Though after the patient began having chest pain in the parking lot and was transported to the ED for more urgent evaluation.

## 2014-06-07 NOTE — ED Notes (Signed)
Attempted report 

## 2014-06-07 NOTE — H&P (Signed)
[facility] Admission History and Physical Service Pager: [phone]  Patient name: Hailey Barnes Medical record number: [id number] Date of birth: [birth year] Age: 53 y.o. Gender: female  Primary Care Provider: Tommi Rumps, MD Consultants: None Code Status: Full Code  Chief Complaint: SOB, Increasing LE swelling, Chest pain.  Assessment and Plan: Hailey Barnes is a 53 y.o. female presenting with SOB, increasing LE edema and chest pain. PMH is significant for COPD, diastolic CHF (EF 81-15%, Grade 2 diastolic dysfunction, with high ventricular filling pressures), HTN, and Tobacco abuse.  Of note, patient had a recent cardiac cath on 03/31/14 that was normal.  Acute diastolic CHF exacerbation - Patient clinically volume overloaded with 2-3+ pitting edema.  Patient has been expressing significant dyspnea, orthopnea and PND.  Patient endorsed 10 lb weight gain in the last week.  No improvement with Lasix 80 mg daily.  Unclear inciting factor of current exacerbation.  Patient has had prior echo on 12/10/13 EF 72-62%, Grade 2 diastolic dysfunction, with high ventricular filling pressures).  Dry weight, ~260.  - Admit to step down, attending Dr. Mingo Amber - Diuresis with Lasix IV 160 mg x 1; will reassess volume status in a.m. - Strict I&O's, Daily Weights - 2-D echo to be obtained during admission - Continuing home Coreg and Aldactone.  Consider addition of ACE inhibitor during admission.  - Will consider cardiology consult if patient fails to improve.  Chest pain - atypical in nature. Unlikely to be secondary to ACS given history and recent normal catheterization. - EKG with no signs of acute ischemia. - Will cycle troponins and repeat EKG in a.m. - Patient was are given aspirin in the ED; continue daily aspirin. - PRN Nitro and PRN Morphine   HTN - Stable  - Continuing home Coreg and Aldactone. - Will monitor closely.  COPD - No evidence of  acute exacerbation this time.  - Continuing home Dulera, Spiriva, albuterol.  - Will monitor closely.   Tobacco abuse - Counseling during admission; patient actively trying to quit.  FEN/GI: SL IV; Heart healthy diet. Prophylaxis: Heparin SQ  Disposition: Telemetry; Pending clinical improvement.  History of Present Illness:  Hailey Barnes is a 53 y.o. female with past medical history of COPD, diastolic CHF (EF 03-55%, Grade 2 diastolic dysfunction, with high ventricular filling pressures), HTN, and Tobacco abuse presents from [facility] clinic with worsening SOB, LE edema and chest pain.  Patient was seen in clinic earlier today.  She reports that she's been experiencing increasing shortness of breath and lower extremity swelling for the past week.  She's been taking Lasix 80 mg daily with little improvement in her symptoms.  She's notes frequent orthopnea (3 pillow) as well as PND.  She's also been experiencing significant fatigue.    In route to the hospital, patient reports that she developed left-sided chest pain.  She states that her chest pain was mild to moderate in severity (4/10).  Pain was described as "tightness".  No radiation. No associated nausea, vomiting, diaphoresis.  Due to developing chest pain in route she was sent to the ED for urgent evaluation.  In the ED she was given aspirin, and nitroglycerin. Laboratory studies were obtained (see below).  Family medicine then called for admission.  Review Of Systems: Per HPI.  Otherwise 12 point review of systems was performed and was unremarkable.  Patient Active Problem List   Diagnosis Date Noted  . Restrictive lung disease 05/10/2014  . Pain  of right thigh 04/12/2014  . Chest pain at rest 03/30/2014  . Nipple pain 03/22/2014  . Vaginal discharge 03/22/2014  . CHF exacerbation 02/21/2014  . Back pain 01/26/2014  . NASH (nonalcoholic steatohepatitis) 01/04/2014  . RUQ pain 12/30/2013  . Diastolic dysfunction  98/33/8250  . Hypertension 05/26/2010  . OBESITY, NOS 12/17/2006  . TOBACCO DEPENDENCE 12/17/2006   Past Medical History: Past Medical History  Diagnosis Date  . 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   Marland Kitchen Hypertension   . COPD (chronic obstructive pulmonary disease)   . Tingling in extremities 12/12/2010    Uric acid and electrolytes (02/23) normal but WBC elevated.   D/Dx: carpal tunnel, ulnar neuropathy Less likely: cervical radiculopathy, vasculitis   . CHF (congestive heart failure)   . Chronic bronchitis     "get it q yr" (02/21/2014)  . Arthritis     "knees" (02/21/2014)  . Complication of anesthesia     "I don't come out well; I chew on my tongue"  . Heart murmur   . GERD (gastroesophageal reflux disease)   . Migraine 1982-2009  . Stroke noted on CAT 02/2014    "light", LLE weakness remains (03/30/2014)  . Chronic lower back pain    Past Surgical History: Past Surgical History  Procedure Laterality Date  . Cesarean section  1982  . Foot surgery Bilateral     "took bones out; put pins in"   Social History: History  Substance Use Topics  . Smoking status: Current Every Day Smoker -- 0.50 packs/day for 37 years    Types: Cigarettes  . Smokeless tobacco: Never Used  . Alcohol Use: No   Family History: Family History  Problem Relation Age of Onset  . Hypertension Mother   . Hypertension Father    Allergies and Medications: No Known Allergies No current facility-administered medications on file prior to encounter.   Current Outpatient Prescriptions on File Prior to Encounter  Medication Sig Dispense Refill  . acetaminophen (TYLENOL) 500 MG tablet Take 500 mg by mouth every 8 (eight) hours as needed for moderate pain.       Marland Kitchen albuterol (PROVENTIL HFA;VENTOLIN HFA) 108 (90 BASE) MCG/ACT inhaler Inhale 2 puffs into the lungs 2 (two) times daily.       Marland Kitchen aspirin 81 MG tablet Take 81 mg by mouth daily.      . carvedilol (COREG) 25 MG tablet  Take 25 mg by mouth 2 (two) times daily with a meal.      . furosemide (LASIX) 40 MG tablet Take 1 tablet (40 mg total) by mouth daily.  90 tablet  1  . gabapentin (NEURONTIN) 100 MG capsule Take 100 mg by mouth 3 (three) times daily.      Marland Kitchen ibuprofen (ADVIL,MOTRIN) 200 MG tablet Take 400 mg by mouth every 6 (six) hours as needed (pain).      . mometasone-formoterol (DULERA) 200-5 MCG/ACT AERO Inhale 2 puffs into the lungs 2 (two) times daily.      Marland Kitchen spironolactone (ALDACTONE) 25 MG tablet Take 25 mg by mouth daily.      Marland Kitchen tiotropium (SPIRIVA) 18 MCG inhalation capsule Place 18 mcg into inhaler and inhale daily.      Marland Kitchen triamcinolone cream (KENALOG) 0.1 % Apply 1 application topically 2 (two) times daily.  30 g  0  . varenicline (CHANTIX PAK) 0.5 MG X 11 & 1 MG X 42 tablet Take one 0.5 mg tablet by  mouth once daily for 3 days, then increase to one 0.5 mg tablet twice daily for 4 days, then increase to one 1 mg tablet twice daily.  53 tablet  0    Objective: BP 123/93  Pulse 57  Temp(Src) 97.4 F (36.3 C) (Oral)  Resp 18  Wt 272 lb (123.378 kg)  SpO2 97% Exam: General: chronically ill appearing female, [place] O2 in place. NAD. HEENT: NCAT. No scleral icterus.  Neck: No appreciable JVD but limited secondary to body habitus. Cardiovascular: RRR. No m/r/g. Respiratory: Mild bibasilar rales noted.  No wheezing noted on exam. Abdomen: obese, soft, nontender, nondistended.  Extremities: 2-3+ pitting lower extremity edema extending to the knee. Skin: Warm, dry, intact Neuro: No focal deficits.  Labs and Imaging: CBC BMET   Recent Labs Lab 06/07/14 1732  WBC 9.2  HGB 13.6  HCT 42.7  PLT 234    Recent Labs Lab 06/07/14 1732  NA 137  K 3.8  CL 100  CO2 24  BUN 17  CREATININE 1.09  GLUCOSE 93  CALCIUM 9.5     BNP (last 3 results)  Recent Labs  02/21/14 2056 05/02/14 2018 06/07/14 1733  PROBNP 162.5* 51.8 56.1   POC Troponin - 0.01.  Dg Chest Portable 1 View 06/07/2014    CLINICAL DATA:  Chest pain and shortness of breath.  EXAM: PORTABLE CHEST - 1 VIEW  COMPARISON:  05/02/2014.  FINDINGS: Trachea is midline. Heart size normal. Lungs are low in volume but clear. No pleural fluid.  IMPRESSION: No acute findings.    EKG - NSR, probable LAE.   Coral Spikes, DO 06/07/2014, 6:29 PM PGY-3, [place] Intern pager: [phone], text pages welcome

## 2014-06-07 NOTE — Care Management Note (Addendum)
  Page 1 of 1   06/08/2014     2:57:39 PM CARE MANAGEMENT NOTE 06/08/2014  Patient:  Hailey Barnes, Hailey Barnes   Account Number:  [id number]  Date Initiated:  06/07/2014  Documentation initiated by:  Tatsuo Musial  Subjective/Objective Assessment:   CHF     Action/Plan:   CM to follow for disposition needs   Anticipated DC Date:  06/10/2014   Anticipated DC Plan:           Choice offered to / List presented to:             Status of service:  Completed, signed off Medicare Important Message given?   (If response is "NO", the following Medicare IM given date fields will be blank) Date Medicare IM given:   Medicare IM given by:   Date Additional Medicare IM given:   Additional Medicare IM given by:    Discharge Disposition:  HOME/SELF CARE  Per UR Regulation:  Reviewed for med. necessity/level of care/duration of stay  If discussed at [place] of Stay Meetings, dates discussed:    Comments:  Angelita Harnack RN, BSN, MSHL, CCM  Nurse - Case Manager,  ([facility])  [phone]  06/08/2014 Social:  From home Disposition Plan:  Home / Self care

## 2014-06-07 NOTE — ED Notes (Signed)
Report to RN on 3E 

## 2014-06-07 NOTE — ED Provider Notes (Signed)
CSN: [id number]     Arrival date & time 06/07/14  1701 History   First MD Initiated Contact with Patient 06/07/14 1707     Chief Complaint  Patient presents with  . Chest Pain   Patient is a 53 y.o. female presenting with chest pain.  Chest Pain  Patient has been having trouble with chest pain and shortness of breath for the last few days. The patient does have a history of CHF as well as chronic bronchitis. She continues to smoke cigarettes. She has noticed increased leg swelling. Patient has had intermittent tightness in her chest. Patient had an episode yesterday which subsequently resolved.  The patient went to the family practice office today. They plan to admit her to the hospital for further evaluation of her shortness of breath. While the ambulance service was there to bring her to the hospital the patient began to complaining of recurrent chest pressure. Patient states it is a 4/10 in the center and left side of her chest. It does not radiate. She does not have any diaphoresis or nausea. She was then brought to the emergency room to have a cardiac evaluation prior to her admission to the hospital.  Patient does not have a history of coronary artery disease. She had a cardiac cath performed in May of this year that did not show any significant occlusions. Past Medical History  Diagnosis Date  . 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   Marland Kitchen Hypertension   . COPD (chronic obstructive pulmonary disease)   . Tingling in extremities 12/12/2010    Uric acid and electrolytes (02/23) normal but WBC elevated.   D/Dx: carpal tunnel, ulnar neuropathy Less likely: cervical radiculopathy, vasculitis   . CHF (congestive heart failure)   . Chronic bronchitis     "get it q yr" (02/21/2014)  . Arthritis     "knees" (02/21/2014)  . Complication of anesthesia     "I don't come out well; I chew on my tongue"  . Heart murmur   . GERD (gastroesophageal reflux disease)   . Migraine  1982-2009  . Stroke noted on CAT 02/2014    "light", LLE weakness remains (03/30/2014)  . Chronic lower back pain    Past Surgical History  Procedure Laterality Date  . Cesarean section  1982  . Foot surgery Bilateral     "took bones out; put pins in"   Family History  Problem Relation Age of Onset  . Hypertension Mother   . Hypertension Father    History  Substance Use Topics  . Smoking status: Current Every Day Smoker -- 0.50 packs/day for 37 years    Types: Cigarettes  . Smokeless tobacco: Never Used  . Alcohol Use: No   OB History   Grav Para Term Preterm Abortions TAB SAB Ect Mult Living   1 1        1      Review of Systems  Cardiovascular: Positive for chest pain.      Allergies  Review of patient's allergies indicates no known allergies.  Home Medications   Prior to Admission medications   Medication Sig Start Date End Date Taking? Authorizing Provider  acetaminophen (TYLENOL) 500 MG tablet Take 500 mg by mouth every 8 (eight) hours as needed for moderate pain.  03/09/14  Yes Zigmund Gottron, MD  albuterol (PROVENTIL HFA;VENTOLIN HFA) 108 (90 BASE) MCG/ACT inhaler Inhale 2 puffs into the lungs 2 (two) times daily.  Yes Historical Provider, MD  aspirin 81 MG tablet Take 81 mg by mouth daily.   Yes Historical Provider, MD  carvedilol (COREG) 25 MG tablet Take 25 mg by mouth 2 (two) times daily with a meal.   Yes Historical Provider, MD  furosemide (LASIX) 40 MG tablet Take 1 tablet (40 mg total) by mouth daily. 05/09/14  Yes Leone Haven, MD  furosemide (LASIX) 80 MG tablet Take 80 mg by mouth daily.   Yes Historical Provider, MD  gabapentin (NEURONTIN) 100 MG capsule Take 100 mg by mouth 3 (three) times daily.   Yes Historical Provider, MD  ibuprofen (ADVIL,MOTRIN) 200 MG tablet Take 400 mg by mouth every 6 (six) hours as needed (pain).   Yes Historical Provider, MD  mometasone-formoterol (DULERA) 200-5 MCG/ACT AERO Inhale 2 puffs into the lungs 2 (two)  times daily.   Yes Historical Provider, MD  spironolactone (ALDACTONE) 25 MG tablet Take 25 mg by mouth daily.   Yes Historical Provider, MD  tiotropium (SPIRIVA) 18 MCG inhalation capsule Place 18 mcg into inhaler and inhale daily.   Yes Historical Provider, MD  triamcinolone cream (KENALOG) 0.1 % Apply 1 application topically 2 (two) times daily. 05/09/14  Yes Leone Haven, MD  varenicline (CHANTIX PAK) 0.5 MG X 11 & 1 MG X 42 tablet Take one 0.5 mg tablet by mouth once daily for 3 days, then increase to one 0.5 mg tablet twice daily for 4 days, then increase to one 1 mg tablet twice daily. 05/09/14  Yes Leone Haven, MD   BP 150/101  Pulse 73  Temp(Src) 97.7 F (36.5 C) (Oral)  Resp 18  Ht 5' 4"  (1.626 m)  Wt 270 lb 11.2 oz (122.789 kg)  BMI 46.44 kg/m2  SpO2 94% Physical Exam  Nursing note and vitals reviewed. Constitutional: She appears well-developed and well-nourished. No distress.  Obese  HENT:  Head: Normocephalic and atraumatic.  Right Ear: External ear normal.  Left Ear: External ear normal.  Eyes: Conjunctivae are normal. Right eye exhibits no discharge. Left eye exhibits no discharge. No scleral icterus.  Neck: Neck supple. No tracheal deviation present.  Cardiovascular: Normal rate, regular rhythm and intact distal pulses.   Pulmonary/Chest: Effort normal and breath sounds normal. No stridor. No respiratory distress. She has no wheezes. She has no rales.  Abdominal: Soft. Bowel sounds are normal. She exhibits no distension. There is no tenderness. There is no rebound and no guarding.  Musculoskeletal: She exhibits edema. She exhibits no tenderness.  Mild pitting edema bilateral lower extremities  Neurological: She is alert. She has normal strength. No cranial nerve deficit (no facial droop, extraocular movements intact, no slurred speech) or sensory deficit. She exhibits normal muscle tone. She displays no seizure activity. Coordination normal.  Skin: Skin is warm  and dry. No rash noted.  Psychiatric: She has a normal mood and affect.    ED Course  Procedures (including critical care time) Labs Review Labs Reviewed  CBC - Abnormal; Notable for the following:    RBC 5.40 (*)    MCH 25.2 (*)    RDW 17.7 (*)    All other components within normal limits  COMPREHENSIVE METABOLIC PANEL - Abnormal; Notable for the following:    Total Bilirubin <0.2 (*)    GFR calc non Af Amer 58 (*)    GFR calc Af Amer 67 (*)    All other components within normal limits  CBC - Abnormal; Notable for the following:  MCV 77.4 (*)    MCH 25.4 (*)    RDW 17.7 (*)    All other components within normal limits  APTT  PROTIME-INR  PRO B NATRIURETIC PEPTIDE  CREATININE, SERUM  BASIC METABOLIC PANEL  CBC  TROPONIN I  TROPONIN I  TROPONIN I  I-STAT TROPOININ, ED  I-STAT TROPOININ, ED  POC URINE PREG, ED    Imaging Review Dg Chest Portable 1 View  06/07/2014   CLINICAL DATA:  Chest pain and shortness of breath.  EXAM: PORTABLE CHEST - 1 VIEW  COMPARISON:  05/02/2014.  FINDINGS: Trachea is midline. Heart size normal. Lungs are low in volume but clear. No pleural fluid.  IMPRESSION: No acute findings.   Electronically Signed   By: Lorin Picket M.D.   On: 06/07/2014 18:01   EKG Rate 63 Sinus rhythm Anterior infarct, old Confirmed by Alexsandro Salek MD-J, Vern Guerette ([id number]) on 06/07/2014 5:40:38 PM  Old Records reviewed.  Cath report June 2015 IMPRESSION OF HEART CATHETERIZATION:  1. Normal left main coronary artery. 2. Minimal disease of left anterior descending artery and normal branches. 3. Normal left circumflex artery and its branches. 4. Normal right coronary artery. 5. Normal left ventricular systolic function by echocardiogram. LVEDP 19 mmHg. Ej. fraction 65-70 %. 6. Normal/Elevated PA and wedge pressures. 7.  MDM   Final diagnoses:  Chest pain, unspecified chest pain type  Shortness of breath    Pt with recent cardiac cath.  Doubt ACS based on her EKG.  Will  give dose of ASA and NTG.  Labs and Xrays ordered.  [place] service will be admitting for further evaluation.    Dorie Rank, MD 06/08/14 [phone]

## 2014-06-07 NOTE — Progress Notes (Signed)
Patient ID: Hailey Barnes, female   DOB: Jun 13, 1961, 53 y.o.   MRN: [id number]  Tommi Rumps, MD Phone: [phone]  Hailey Barnes No is a 53 y.o. female who presents today for f/u.  Diastolic Heart Failure: patient reports not feeling well over the past week. She has not been able to get the fluid off of her. She notes she has put on 10 lbs in the past week despite taking lasix 80 mg PO daily. She notes increasing dyspnea, orthopnea, and PND. She does note she has been coughing as well. She feels fatigued. She notes a left sided sharp chest pain that does not radiate and is not associated with diaphoresis. This is sometimes exertional, though can occur at rest and does not resolve with rest.   Of note the patient has had 2 admission for heart failure exacerbations in the past year. Her echo in February 2015 revealed EF 99-37%, grade 2 diastolic dysfunction, and increased filling pressures. Her Cardiologist is Dr Doylene Canard.  Patient is a smoker.   ROS: Per HPI   Physical Exam Filed Vitals:   06/07/14 1457  BP: 153/109  Pulse: 76  Temp: 98.1 F (36.7 C)  Ambulatory O2 sat: 95%  Gen: Well NAD HEENT: PERRL,  MMM Lungs: minimal faint bibasilar crackles Nl WOB Heart: RRR no MRG, no JVD noted Abd: soft, NT, distended Exts: 2+ pitting edema BLE, warm and well perfused.    Assessment/Plan: Please see individual problem list.  # Healthcare maintenance: needs tdap at hospital follow-up  Tommi Rumps, MD [place] PGY-3

## 2014-06-08 ENCOUNTER — Encounter (HOSPITAL_COMMUNITY): Payer: Self-pay | Admitting: General Practice

## 2014-06-08 DIAGNOSIS — R079 Chest pain, unspecified: Secondary | ICD-10-CM

## 2014-06-08 DIAGNOSIS — I517 Cardiomegaly: Secondary | ICD-10-CM

## 2014-06-08 DIAGNOSIS — I509 Heart failure, unspecified: Secondary | ICD-10-CM

## 2014-06-08 LAB — CREATININE, SERUM
Creatinine, Ser: 1.07 mg/dL (ref 0.50–1.10)
GFR calc non Af Amer: 59 mL/min — ABNORMAL LOW (ref >90)
GFR, EST AFRICAN AMERICAN: 68 mL/min — AB (ref >90)

## 2014-06-08 LAB — BASIC METABOLIC PANEL
ANION GAP: 15 (ref 5–15)
BUN: 19 mg/dL (ref 6–23)
CHLORIDE: 99 meq/L (ref 96–112)
CO2: 23 meq/L (ref 19–32)
CREATININE: 1.08 mg/dL (ref 0.50–1.10)
Calcium: 9.1 mg/dL (ref 8.4–10.5)
GFR calc non Af Amer: 58 mL/min — ABNORMAL LOW (ref >90)
GFR, EST AFRICAN AMERICAN: 68 mL/min — AB (ref >90)
Glucose, Bld: 103 mg/dL — ABNORMAL HIGH (ref 70–99)
POTASSIUM: 3.6 meq/L — AB (ref 3.7–5.3)
Sodium: 137 mEq/L (ref 137–147)

## 2014-06-08 LAB — CBC
HEMATOCRIT: 42.6 % (ref 36.0–46.0)
Hemoglobin: 13.7 g/dL (ref 12.0–15.0)
MCH: 25 pg — ABNORMAL LOW (ref 26.0–34.0)
MCHC: 32.2 g/dL (ref 30.0–36.0)
MCV: 77.7 fL — ABNORMAL LOW (ref 78.0–100.0)
Platelets: 266 10*3/uL (ref 150–400)
RBC: 5.48 MIL/uL — AB (ref 3.87–5.11)
RDW: 17.8 % — ABNORMAL HIGH (ref 11.5–15.5)
WBC: 8.9 10*3/uL (ref 4.0–10.5)

## 2014-06-08 LAB — TROPONIN I: Troponin I: <0.3 ng/mL (ref <0.30)

## 2014-06-08 MED ORDER — FUROSEMIDE 10 MG/ML IJ SOLN
160.0000 mg | Freq: Once | INTRAVENOUS | Status: AC
  Administered 2014-06-08: 160 mg via INTRAVENOUS
  Filled 2014-06-08: qty 16

## 2014-06-08 MED ORDER — FUROSEMIDE 80 MG PO TABS
80.0000 mg | ORAL_TABLET | Freq: Two times a day (BID) | ORAL | Status: DC
  Administered 2014-06-08 – 2014-06-09 (×2): 80 mg via ORAL
  Filled 2014-06-08 (×5): qty 1

## 2014-06-08 NOTE — Progress Notes (Signed)
FMTS Attending Daily Note:  Hailey Sabal MD  [phone] pager  Family Practice pager:  [phone] I have seen and examined this patient and have reviewed their chart. I have discussed this patient with the resident. I agree with the resident's findings, assessment and care plan.  Additionally:  Some response to Lasix.  Meds now TID dosing. Hopeing for somewhat more brisk diuresis o/n, tomorrow.  Dry weight for past several years appears to be around 260 lbs.    Alveda Reasons, MD 06/08/2014 4:00 PM

## 2014-06-08 NOTE — Progress Notes (Signed)
Patient ID: Hailey Barnes, female   DOB: [birth year], 53 y.o.   MRN: [id number] Family Medicine Teaching Service Daily Progress Note Intern Pager: [phone]  Patient name: Hailey Barnes Medical record number: [id number] Date of birth: 1961/09/17 Age: 53 y.o. Gender: female  Primary Care Provider: Tommi Rumps, MD Consultants: None Code Status: Full  Pt Overview and Major Events to Date:  8/19 - admitted for CHF exacerbation  Assessment and Plan: Hailey Barnes is a 53 y.o. female presenting with SOB, increasing LE edema and chest pain. PMH is significant for COPD, diastolic CHF (EF 63-87%, Grade 2 diastolic dysfunction, with high ventricular filling pressures), HTN, and Tobacco abuse. Of note, patient had a recent cardiac cath on 03/31/14 that was normal.   Acute diastolic CHF exacerbation - Patient clinically volume overloaded with 2+ pitting edema. Patient has dyspnea, orthopnea and PND. Patient endorsed 10 lb weight gain in the last week. No improvement with Lasix 80 mg daily. Unclear inciting factor of current exacerbation. Patient has had prior echo on 12/10/13 EF 56-43%, Grade 2 diastolic dysfunction, with high ventricular filling pressures. Dry weight, ~260. Volume status today net -1.8L -s/p diuresis with Lasix IV 160 mg x 2  -Will diuresis some more due to patient still being fluid overloaded- 35m BID scheduled -Strict I&O's -Daily Weights - today is 268 -2-D echo ordered -Continuing home Coreg and Aldactone.  -Consider adding ACE to her regimen - patient previously on HCTZ-Lisinopril combo but was not beneficial to patient. ACE was stopped by cardiology for reasons unknown. -Will consider cardiology consult if patient fails to improve.  -fluid restriction   Chest pain - atypical in nature. Unlikely ACS given history and recent normal catheterization.  - EKG x2 with no signs of acute ischemia.  - Cycled troponins neg. - continue daily aspirin.  - PRN  Nitro and PRN Morphine   HTN - BPs currently stable - Continuing home Coreg and Aldactone.  - Will monitor closely.   COPD - no breathing problems currently; patient does not require O2 currently - No evidence of acute exacerbation this time.  - Continuing home Dulera, Spiriva, albuterol. - Will monitor closely.  -Give oxygen as needed for O2 <92%  Tobacco abuse - 1/2ppd; trying to quit started Chantix recently -Counseling during admission; patient actively trying to quit.  -patient said she will get Chantix from home-- nurse will hold and administer as appropriate  FEN/GI: SL IV; Heart healthy diet.  Prophylaxis: Heparin SQ  Disposition: Telemetry; Pending clinical improvement.  Subjective:  Patient doing well this morning. She says she feels much better than yesterday. Still believes she has fluid in her legs. She slept fine last night with the bed elevated.  Objective: Temp:  [97.4 F (36.3 C)-98.1 F (36.7 C)] 97.6 F (36.4 C) (08/20 0549) Pulse Rate:  [57-76] 75 (08/20 0549) Resp:  [14-20] 18 (08/20 0549) BP: (101-158)/(55-109) 101/55 mmHg (08/20 0549) SpO2:  [94 %-100 %] 97 % (08/20 0549) Weight:  [268 lb 1.6 oz (121.609 kg)-272 lb (123.378 kg)] 268 lb 1.6 oz (121.609 kg) (08/20 0549) Physical Exam: General: NAD, awake and alert, cooperative, obese HEENT: NCAT. No scleral icterus.  Neck: No appreciable JVD but limited secondary to body habitus.  Cardiovascular: RRR. No m/r/g.  Respiratory: CTAB. No wheezing noted on exam.  Abdomen: obese, soft, nontender, nondistended.  Extremities: 2+ pitting lower extremity edema extending to the knee.  Skin: Warm, dry. Some skin cracking on bottoms of the feet bilaterally Neuro:  No focal deficits.  Laboratory:  Recent Labs Lab 06/07/14 1732 06/07/14 2200 06/08/14 0425  WBC 9.2 9.2 8.9  HGB 13.6 12.5 13.7  HCT 42.7 38.1 42.6  PLT 234 228 266    Recent Labs Lab 06/07/14 1732 06/07/14 2200 06/08/14 0425  NA 137  --   137  K 3.8  --  3.6*  CL 100  --  99  CO2 24  --  23  BUN 17  --  19  CREATININE 1.09 1.07 1.08  CALCIUM 9.5  --  9.1  PROT 6.9  --   --   BILITOT <0.2*  --   --   ALKPHOS 84  --   --   ALT 14  --   --   AST 19  --   --   GLUCOSE 93  --  103*   BNP    Component Value Date/Time   PROBNP 56.1 06/07/2014 1733     Imaging/Diagnostic Tests: Dg Chest Portable 1 View  06/07/2014   CLINICAL DATA:  Chest pain and shortness of breath.  EXAM: PORTABLE CHEST - 1 VIEW  COMPARISON:  05/02/2014.  FINDINGS: Trachea is midline. Heart size normal. Lungs are low in volume but clear. No pleural fluid.  IMPRESSION: No acute findings.   Electronically Signed   By: Lorin Picket M.D.   On: 06/07/2014 18:01   Katheren Shams, DO 06/08/2014, 6:57 AM PGY-1, [place] Intern pager: [phone], text pages welcome

## 2014-06-08 NOTE — Progress Notes (Signed)
Echo Lab  2D Echocardiogram completed.  [place], RDCS 06/08/2014 9:56 AM

## 2014-06-08 NOTE — Progress Notes (Signed)
UR completed Tuyet Bader K. Evalena Fujii, RN, BSN, [place], CCM  06/08/2014 2:55 PM

## 2014-06-08 NOTE — Progress Notes (Signed)
Family Practice Teaching Service PCP Progress Note  I went by to see Mrs Hailey Barnes and she is improved from yesterday when I saw her in clinic. She states she is breathing better. She has responded well so far with a decrease in her weight and good urine output. I agree with all the excellent care provided by the inpatient team. I am happy to see the patient in follow-up as an outpatient when she is discharged.   Tommi Rumps, MD Family Medicine PGY-2 Service Pager [phone]

## 2014-06-08 NOTE — H&P (Signed)
FMTS Attending Admit Note Patient seen and examined on day of admission, discussed with resident team and I agree with plan of care as outlined by admitting resident.  Briefly, 9yoF with known diastolic CHF with last ECHO in Feb 2015, who presents to [facility] with worsening dyspnea, orthopnea and weight gain despite use of oral furosemide 63m/day.  Also remarks about intermittent chest pain. On exam she is alert, able to ambulate to bathroom, no apparent distress. Neck supple, no appreciable JVD.  COR regular S1S2, no extra sounds PULM Clear, without rales or wheezes EXTS: 2-3+ bilateral pitting edema, symmetric.   A/P" Patient with known diastolic CHF who presents with dypsnea/orthopnea and intermittent chest pain. Not diuresing well in outpatient setting. For admission and administration of IV lasix, monitor electrolytes. Troponin I and serial enzymes in setting of reported chest pain.  JDalbert Mayotte MD

## 2014-06-09 LAB — BASIC METABOLIC PANEL
ANION GAP: 12 (ref 5–15)
BUN: 22 mg/dL (ref 6–23)
CALCIUM: 9.2 mg/dL (ref 8.4–10.5)
CO2: 29 meq/L (ref 19–32)
CREATININE: 1.29 mg/dL — AB (ref 0.50–1.10)
Chloride: 97 mEq/L (ref 96–112)
GFR, EST AFRICAN AMERICAN: 55 mL/min — AB (ref >90)
GFR, EST NON AFRICAN AMERICAN: 47 mL/min — AB (ref >90)
Glucose, Bld: 101 mg/dL — ABNORMAL HIGH (ref 70–99)
Potassium: 3.7 mEq/L (ref 3.7–5.3)
SODIUM: 138 meq/L (ref 137–147)

## 2014-06-09 MED ORDER — CARVEDILOL 25 MG PO TABS
25.0000 mg | ORAL_TABLET | Freq: Two times a day (BID) | ORAL | Status: DC
  Administered 2014-06-09: 25 mg via ORAL
  Filled 2014-06-09 (×3): qty 1

## 2014-06-09 MED ORDER — FUROSEMIDE 80 MG PO TABS: 80.0000 mg | ORAL_TABLET | Freq: Two times a day (BID) | ORAL | Status: DC

## 2014-06-09 NOTE — Progress Notes (Signed)
Patient ID: Hailey Barnes, female   DOB: 06/18/1961, 53 y.o.   MRN: [id number] Family Medicine Teaching Service Daily Progress Note Intern Pager: [phone]  Patient name: Hailey Barnes Medical record number: [id number] Date of birth: [birth year] Age: 53 y.o. Gender: female  Primary Care Provider: Tommi Rumps, MD Consultants: None Code Status: Full  Pt Overview and Major Events to Date:  8/19 - admitted for CHF exacerbation 8/20 - Continue diuresing  8/21- Patient had 6s pause on rhythm strip returned to regular sinus adn was assymptomatic  Assessment and Plan: Hailey Barnes is a 53 y.o. female presenting with SOB, increasing LE edema and chest pain. PMH is significant for COPD, diastolic CHF (EF 13-08%, Grade 2 diastolic dysfunction, with high ventricular filling pressures), HTN, and Tobacco abuse. Of note, patient had a recent cardiac cath on 03/31/14 that was normal.   Acute diastolic CHF exacerbation - Patient clinically volume overloaded with 1+ pitting edema. Patient has dyspnea, orthopnea and PND. Patient endorsed 10 lb weight gain in the last week. No improvement with home Lasix 80 mg daily. Unclear inciting factor of current exacerbation. Patient has had prior echo on 12/10/13 EF 65-78%, Grade 2 diastolic dysfunction, with high ventricular filling pressures. Dry weight, ~260. Volume status today net -4.2L -s/p diuresis with Lasix IV 160 mg x 2  -Lasix 44m BID scheduled -Strict I&O's -Daily Weights -2-D echo- grade 1 diastolic dysfunction; EF 646-96%-Continuing home Coreg and Aldactone.  -Consider adding ACE to her regimen - patient previously on HCTZ-Lisinopril combo but was not beneficial to patient. ACE was stopped by cardiology for reasons unknown. -Will consider cardiology consult if patient fails to improve.  -fluid restriction   Chest pain - atypical in nature. Unlikely ACS given history and recent normal catheterization.  - EKG x2 with no signs  of acute ischemia.  - Cycled troponins neg. - continue daily aspirin.  - PRN Nitro and PRN Morphine   HTN - BPs currently stable - Continuing home Coreg and Aldactone.  - Will monitor closely.   COPD - no breathing problems currently; patient does not require O2 currently - No evidence of acute exacerbation this time.  - Continuing home Dulera, Spiriva, albuterol. - Will monitor closely.  -Give oxygen as needed for O2 <92%  Tobacco abuse - 1/2ppd; trying to quit started Chantix recently -Counseling during admission; patient actively trying to quit.  -patient said she will get Chantix from home-- nurse will hold and administer as appropriate  FEN/GI: SL IV; Heart healthy diet.  Prophylaxis: Heparin SQ  Disposition: Telemetry; discharge home today.  Subjective:  Patient doing well this morning. She is concerned because she was told her heart stopped beating. She was sleeping fine during this time. I reassured patient. Says that her skin in hands, stomach, and legs feel less tight.  Objective: Temp:  [97.2 F (36.2 C)-98 F (36.7 C)] 97.9 F (36.6 C) (08/21 0454) Pulse Rate:  [69-85] 85 (08/21 0454) Resp:  [18] 18 (08/21 0454) BP: (123-137)/(74-95) 137/95 mmHg (08/21 0454) SpO2:  [94 %-100 %] 100 % (08/21 0841) Weight:  [269 lb 6.4 oz (122.199 kg)] 269 lb 6.4 oz (122.199 kg) (08/21 0454) Physical Exam: General: NAD, awake and alert, cooperative, obese HEENT: NCAT. No scleral icterus.  Neck: No appreciable JVD but limited secondary to body habitus.  Cardiovascular: RRR. No m/r/g.  Respiratory: CTAB. No wheezing noted on exam.  Abdomen: obese, soft, nontender, nondistended.  Extremities: 1+ pitting lower extremity edema Skin:  Warm, dry. Some skin cracking on bottoms of the feet bilaterally Neuro: No focal deficits.  Intake/Output Summary (Last 24 hours) at 06/09/14 0931 Last data filed at 06/09/14 0854  Gross per 24 hour  Intake   1080 ml  Output   3525 ml  Net  -2445  ml   Laboratory:  Recent Labs Lab 06/07/14 1732 06/07/14 2200 06/08/14 0425  WBC 9.2 9.2 8.9  HGB 13.6 12.5 13.7  HCT 42.7 38.1 42.6  PLT 234 228 266    Recent Labs Lab 06/07/14 1732 06/07/14 2200 06/08/14 0425 06/09/14 0547  NA 137  --  137 138  K 3.8  --  3.6* 3.7  CL 100  --  99 97  CO2 24  --  23 29  BUN 17  --  19 22  CREATININE 1.09 1.07 1.08 1.29*  CALCIUM 9.5  --  9.1 9.2  PROT 6.9  --   --   --   BILITOT <0.2*  --   --   --   ALKPHOS 84  --   --   --   ALT 14  --   --   --   AST 19  --   --   --   GLUCOSE 93  --  103* 101*   BNP    Component Value Date/Time   PROBNP 56.1 06/07/2014 1733    Imaging/Diagnostic Tests: Dg Chest Portable 1 View  06/07/2014   CLINICAL DATA:  Chest pain and shortness of breath.  EXAM: PORTABLE CHEST - 1 VIEW  COMPARISON:  05/02/2014.  FINDINGS: Trachea is midline. Heart size normal. Lungs are low in volume but clear. No pleural fluid.  IMPRESSION: No acute findings.   Electronically Signed   By: Lorin Picket M.D.   On: 06/07/2014 18:01   Katheren Shams, DO 06/09/2014, 9:31 AM PGY-1, [place] Intern pager: [phone], text pages welcome

## 2014-06-09 NOTE — Progress Notes (Signed)
Pt in sinus rhythm, but had a 6 second pause tonight. Pt received 25 mg Coreg at dinnertime. VS stable and pt sleeping. MD notified. MD instructed to call back if pt experiences any change in rhythm. Will continue to monitor.

## 2014-06-09 NOTE — Discharge Summary (Signed)
[facility] Discharge Summary  Patient name: Hailey Barnes Medical record number: [id number] Date of birth: [birth year] Age: 53 y.o. Gender: female Date of Admission: 06/07/2014  Date of Discharge: 06/09/2014 Admitting Physician: Alveda Reasons, MD  Primary Care Provider: Tommi Rumps, MD Consultants: None  Indication for Hospitalization: CHF Exacerbation  Discharge Diagnoses/Problem List:  Patient Active Problem List   Diagnosis Date Noted  . Acute exacerbation of CHF (congestive heart failure) 06/07/2014  . Restrictive lung disease 05/10/2014  . Pain of right thigh 04/12/2014  . Chest pain at rest 03/30/2014  . Nipple pain 03/22/2014  . Vaginal discharge 03/22/2014  . CHF exacerbation 02/21/2014  . Back pain 01/26/2014  . NASH (nonalcoholic steatohepatitis) 01/04/2014  . RUQ pain 12/30/2013  . Diastolic dysfunction 69/48/5462  . Hypertension 05/26/2010  . OBESITY, NOS 12/17/2006  . TOBACCO DEPENDENCE 12/17/2006   Disposition: Discharge Home  Discharge Condition: Stable  Discharge Exam:  General: NAD, awake and alert, cooperative, obese  HEENT: NCAT. No scleral icterus.  Neck: No appreciable JVD but limited secondary to body habitus.  Cardiovascular: RRR. No m/r/g.  Respiratory: CTAB. No wheezing noted on exam.  Abdomen: obese, soft, nontender, nondistended.  Extremities: 1+ pitting lower extremity edema  Skin: Warm, dry. Some skin cracking on bottoms of the feet bilaterally  Neuro: No focal deficits.  Brief Hospital Course:  Hailey Barnes is a 53 y.o. female who presented with SOB, increasing LE edema, and chest pain. PMH is significant for COPD, diastolic CHF, HTN, and tobacco abuse. Patient clinically volume overloaded on admission with  3+ pitting edema. Patient has dyspnea, orthopnea and PND. Patient endorsed 10 lb weight gain. Home Lasix 80 mg daily had no effect. This appeared to be an acute CHF exacerbation.  Unclear inciting factor of current exacerbation. Paitent was given IV Lasix and then switched to PO Lasix 68m BID with continued diruesis. Net total at time of discharge was -4.5L.Home Coreg and Aldactone were continued.   Patient's chest pain appeared atypical in nature. Troponins were cycled and were negative. EKG x2 had no signs of acute ischemia. ASA daily was continued. Of note, patient had a recent cardiac cath on 03/31/14 that was normal. 2-D echo showed grade 1 diastolic dysfunction; EF 670-35%  Management of patient's other problems were as follows: HTN  - Continued home Coreg and Aldactone.  COPD - no breathing difficulty; patient did not require O2   - No evidence of acute exacerbation this time.  - Continued home Dulera, Spiriva, albuterol.  Tobacco abuse - 1/2ppd; trying to quit started Chantix recently   Issues for Follow Up:  -Smoking cessation patient used Chantix in hospital -Consider adding ACE to CHF medications; previously on but stopped for unknown reasons -Monitor weights and edema; now on Lasix 851mBID  Significant Procedures: None  Significant Labs and Imaging:   Recent Labs Lab 06/07/14 1732 06/07/14 2200 06/08/14 0425  WBC 9.2 9.2 8.9  HGB 13.6 12.5 13.7  HCT 42.7 38.1 42.6  PLT 234 228 266    Recent Labs Lab 06/07/14 1732 06/07/14 2200 06/08/14 0425 06/09/14 0547  NA 137  --  137 138  K 3.8  --  3.6* 3.7  CL 100  --  99 97  CO2 24  --  23 29  GLUCOSE 93  --  103* 101*  BUN 17  --  19 22  CREATININE 1.09 1.07 1.08 1.29*  CALCIUM 9.5  --  9.1 9.2  ALKPHOS 84  --   --   --   AST 19  --   --   --   ALT 14  --   --   --   ALBUMIN 3.5  --   --   --     Dg Chest Portable 1 View  06/07/2014 CLINICAL DATA: Chest pain and shortness of breath. EXAM: PORTABLE CHEST - 1 VIEW COMPARISON: 05/02/2014. FINDINGS: Trachea is midline. Heart size normal. Lungs are low in volume but clear. No pleural fluid. IMPRESSION: No acute findings. Electronically Signed  By: Lorin Picket M.D. On: 06/07/2014 18:01   Results/Tests Pending at Time of Discharge: None  Discharge Medications:    Medication List         acetaminophen 500 MG tablet  Commonly known as:  TYLENOL  Take 500 mg by mouth every 8 (eight) hours as needed for moderate pain.     albuterol 108 (90 BASE) MCG/ACT inhaler  Commonly known as:  PROVENTIL HFA;VENTOLIN HFA  Inhale 2 puffs into the lungs 2 (two) times daily.     aspirin 81 MG tablet  Take 81 mg by mouth daily.     carvedilol 25 MG tablet  Commonly known as:  COREG  Take 25 mg by mouth 2 (two) times daily with a meal.     DULERA 200-5 MCG/ACT Aero  Generic drug:  mometasone-formoterol  Inhale 2 puffs into the lungs 2 (two) times daily.     furosemide 80 MG tablet  Commonly known as:  LASIX  Take 1 tablet (80 mg total) by mouth 2 (two) times daily.     gabapentin 100 MG capsule  Commonly known as:  NEURONTIN  Take 100 mg by mouth 3 (three) times daily.     ibuprofen 200 MG tablet  Commonly known as:  ADVIL,MOTRIN  Take 400 mg by mouth every 6 (six) hours as needed (pain).     spironolactone 25 MG tablet  Commonly known as:  ALDACTONE  Take 25 mg by mouth daily.     tiotropium 18 MCG inhalation capsule  Commonly known as:  SPIRIVA  Place 18 mcg into inhaler and inhale daily.     triamcinolone cream 0.1 %  Commonly known as:  KENALOG  Apply 1 application topically 2 (two) times daily.     varenicline 0.5 MG X 11 & 1 MG X 42 tablet  Commonly known as:  CHANTIX PAK  Take one 0.5 mg tablet by mouth once daily for 3 days, then increase to one 0.5 mg tablet twice daily for 4 days, then increase to one 1 mg tablet twice daily.        Discharge Instructions: Please refer to Patient Instructions section of EMR for full details.  Patient was counseled important signs and symptoms that should prompt return to medical care, changes in medications, dietary instructions, activity restrictions, and follow up  appointments.   Follow-Up Appointments:     Follow-up Information   Follow up with Dimas Chyle, MD On 06/13/2014. (@9 :00am for hospital f/u)    Specialty:  Family Medicine   Contact information:   [address] [address] [place], DO 06/09/2014, 11:54 AM PGY-1, [place] [facility]

## 2014-06-09 NOTE — Discharge Instructions (Signed)
Discharge Date: 06/09/2014  Reason for Hospitalization: -You had a CHF exacerbation and some fluid overload -We will be discharging you home on Lasix 61m BID -Continue to monitor your weight and if you feel SOB or have more chest pain please go to the ED or call your doctor.  You have a hospital follow-up appointment for Tuesday (825).  Thank you for letting [facility] a part of your care!

## 2014-06-09 NOTE — Progress Notes (Signed)
Pt is being discharged home. Pt has been provided with discharge instructions. RN went over instructions with the patient and answered all questions the patient had. Pt is being transported by her family home.

## 2014-06-10 NOTE — Progress Notes (Signed)
FMTS Attending Daily Note:  Annabell Sabal MD  [phone] pager  Family Practice pager:  [phone] I have seen and examined this patient and have reviewed their chart. I have discussed this patient with the resident. I agree with the resident's findings, assessment and care plan.  Additionally:  Much improved.  Switched to PO diuretics yesterday.  Good UOP.  She is anxious to go home.   As she has done so well, plan for DC home.   Alveda Reasons, MD

## 2014-06-11 NOTE — Discharge Summary (Signed)
Family Medicine Teaching Service  Discharge Note : Attending Jeff Thurl Boen MD Pager [phone] Inpatient Team Pager:  [phone]  I have reviewed this patient and the patient's chart and have discussed discharge planning with the resident at the time of discharge. I agree with the discharge plan as above.    

## 2014-06-13 ENCOUNTER — Encounter: Payer: Self-pay | Admitting: Family Medicine

## 2014-06-13 ENCOUNTER — Ambulatory Visit (INDEPENDENT_AMBULATORY_CARE_PROVIDER_SITE_OTHER): Payer: No Typology Code available for payment source | Admitting: Family Medicine

## 2014-06-13 VITALS — BP 159/94 | HR 86 | Temp 97.7°F | Wt 272.0 lb

## 2014-06-13 DIAGNOSIS — I5032 Chronic diastolic (congestive) heart failure: Secondary | ICD-10-CM | POA: Insufficient documentation

## 2014-06-13 DIAGNOSIS — Z716 Tobacco abuse counseling: Secondary | ICD-10-CM | POA: Insufficient documentation

## 2014-06-13 DIAGNOSIS — K0889 Other specified disorders of teeth and supporting structures: Secondary | ICD-10-CM | POA: Insufficient documentation

## 2014-06-13 DIAGNOSIS — I10 Essential (primary) hypertension: Secondary | ICD-10-CM

## 2014-06-13 DIAGNOSIS — K089 Disorder of teeth and supporting structures, unspecified: Secondary | ICD-10-CM

## 2014-06-13 DIAGNOSIS — I509 Heart failure, unspecified: Secondary | ICD-10-CM

## 2014-06-13 DIAGNOSIS — I5022 Chronic systolic (congestive) heart failure: Secondary | ICD-10-CM

## 2014-06-13 DIAGNOSIS — I5030 Unspecified diastolic (congestive) heart failure: Secondary | ICD-10-CM | POA: Insufficient documentation

## 2014-06-13 DIAGNOSIS — F17200 Nicotine dependence, unspecified, uncomplicated: Secondary | ICD-10-CM

## 2014-06-13 MED ORDER — LISINOPRIL 5 MG PO TABS: 5.0000 mg | ORAL_TABLET | Freq: Every day | ORAL | Status: DC

## 2014-06-13 NOTE — Assessment & Plan Note (Signed)
Elevated BP today. Patient has been on ACE inhibitors in the past, but does not remember why they were stopped. Given patient's CHF we will restart lisinopril at this time. Patient counseled on adverse reactions such as cough and angioedema.

## 2014-06-13 NOTE — Assessment & Plan Note (Signed)
Much improved today. Constant weight of 268 pounds at home. Recommended daily weights and continue furosemide 46m prn to keep weight stable. Continue carvedilol. Started lisinopril 569mqday. Will check BMP to follow up renal function. Follow up in 1 week for possible uptitration of lisinopril pending Cr results.

## 2014-06-13 NOTE — Progress Notes (Addendum)
Patient ID: Hailey Barnes, female   DOB: 25-Jun-1961, 53 y.o.   MRN: [id number]    Hailey Barnes is a 53 y.o. female who presents to [facility] today for hospital follow up for CHF exacerbation.  HPI:  CHF:  Weighed 268 after discharge. Weighed 272 pounds today. Endorses some sharpness of breath. Currently taking furosemide 65m bid. Shortness of breath much improved improved. Sleeping on 2 pillows at night. Was sleeping on 3. Says that the swelling in her feet is much improved. Overall feeling much better.  Tobacco Abuse Started Chantix while hospitalized. 30 pack year history. Smoking about 10 per day now. No hallucinations, insomnia, or suicidal thoughts.  Headache Started with chantix initiation. Constant, nagging pain located at the back of her head and across front of forehead. 5/10 at worst. No alleviating or aggravating factors. Not worse during any particular part of the day.  Toothache Located in her left mandible. Not adequately responding to OTC analgesics. Requests referral to dentist.  ROS: As per HPI.  Past Medical History Patient Active Problem List   Diagnosis Date Noted  . Chronic diastolic CHF (congestive heart failure) 06/13/2014  . Toothache 06/13/2014  . Restrictive lung disease 05/10/2014  . Pain of right thigh 04/12/2014  . Chest pain at rest 03/30/2014  . Nipple pain 03/22/2014  . Vaginal discharge 03/22/2014  . Back pain 01/26/2014  . NASH (nonalcoholic steatohepatitis) 01/04/2014  . RUQ pain 12/30/2013  . Diastolic dysfunction 059/56/3875 . Hypertension 05/26/2010  . OBESITY, NOS 12/17/2006  . TOBACCO DEPENDENCE 12/17/2006    Medications- reviewed and updated Current Outpatient Prescriptions  Medication Sig Dispense Refill  . acetaminophen (TYLENOL) 500 MG tablet Take 500 mg by mouth every 8 (eight) hours as needed for moderate pain.       .Marland Kitchenalbuterol (PROVENTIL HFA;VENTOLIN HFA) 108 (90 BASE) MCG/ACT inhaler Inhale 2 puffs into the  lungs 2 (two) times daily.       .Marland Kitchenaspirin 81 MG tablet Take 81 mg by mouth daily.      . carvedilol (COREG) 25 MG tablet Take 25 mg by mouth 2 (two) times daily with a meal.      . furosemide (LASIX) 80 MG tablet Take 1 tablet (80 mg total) by mouth 2 (two) times daily.  60 tablet  2  . gabapentin (NEURONTIN) 100 MG capsule Take 100 mg by mouth 3 (three) times daily.      . mometasone-formoterol (DULERA) 200-5 MCG/ACT AERO Inhale 2 puffs into the lungs 2 (two) times daily.      .Marland Kitchenspironolactone (ALDACTONE) 25 MG tablet Take 25 mg by mouth daily.      .Marland Kitchentiotropium (SPIRIVA) 18 MCG inhalation capsule Place 18 mcg into inhaler and inhale daily.      .Marland Kitchentriamcinolone cream (KENALOG) 0.1 % Apply 1 application topically 2 (two) times daily.  30 g  0  . varenicline (CHANTIX PAK) 0.5 MG X 11 & 1 MG X 42 tablet Take one 0.5 mg tablet by mouth once daily for 3 days, then increase to one 0.5 mg tablet twice daily for 4 days, then increase to one 1 mg tablet twice daily.  53 tablet  0  . ibuprofen (ADVIL,MOTRIN) 200 MG tablet Take 400 mg by mouth every 6 (six) hours as needed (pain).      .Marland Kitchenlisinopril (PRINIVIL,ZESTRIL) 5 MG tablet Take 1 tablet (5 mg total) by mouth daily.  30 tablet  2   No current facility-administered medications  for this visit.    Objective: Physical Exam: BP 159/94  Pulse 86  Temp(Src) 97.7 F (36.5 C) (Oral)  Wt 272 lb (123.378 kg)  Gen: NAD, resting comfortably] HEENT: Significant dental carries and fillings present in left lower molars. No fluctuance or purulent drainage noted. CV: RRR no murmurs appreciated Lungs: CTAB no crackles, wheeze, rhonchi Abdomen: soft/nontender/nondistended/normal bowel sounds. Ext: 1+ pitting edema to mid tibia bilaterally. Skin: warm, dry Neuro: grossly normal, moves all extremities  No results found for this or any previous visit (from the past 72 hour(s)).  A/P: See problem list  Chronic diastolic CHF (congestive heart failure) Much  improved today. Constant weight of 268 pounds at home. Recommended daily weights and continue furosemide 55m prn to keep weight stable. Continue carvedilol. Started lisinopril 569mqday. Will check BMP to follow up renal function. Follow up in 1 week for possible uptitration of lisinopril pending Cr results.  Hypertension Elevated BP today. Patient has been on ACE inhibitors in the past, but does not remember why they were stopped. Given patient's CHF we will restart lisinopril at this time. Patient counseled on adverse reactions such as cough and angioedema.   TOBACCO DEPENDENCE Patient on chantix. Only side effect at this time is constant, nagging headache. Will continue to monitor closely. Instructed patient to call office if headaches worsen or become intolerable so that we may adjust her dose of chantix.  Toothache Placed referral to dentistry.    Orders Placed This Encounter  Procedures  . Basic Metabolic Panel  . Ambulatory referral to Dentistry    Referral Priority:  Routine    Referral Type:  Consultation    Referral Reason:  Specialty Services Required    Requested Specialty:  Dental General Practice    Number of Visits Requested:  1    Meds ordered this encounter  Medications  . lisinopril (PRINIVIL,ZESTRIL) 5 MG tablet    Sig: Take 1 tablet (5 mg total) by mouth daily.    Dispense:  30 tablet    Refill:  2     Shanley Furlough M. PaJerline PainMDEtna Greenesident PGY-1 06/13/2014 10:44 AM

## 2014-06-13 NOTE — Assessment & Plan Note (Signed)
Patient on chantix. Only side effect at this time is constant, nagging headache. Will continue to monitor closely. Instructed patient to call office if headaches worsen or become intolerable so that we may adjust her dose of chantix.

## 2014-06-13 NOTE — Patient Instructions (Signed)
Thank you for coming to the clinic today. It was nice seeing you.  For your heart failure, you we are glad to see you doing better. It is important to weigh your self daily. Take your furosemide if your weight starts to increase or if you noticed increased edema in your legs, or if you become short of breath. Try to keep your weight around 27 the same every morning.  We are also starting you on a new medication today for your blood pressure, called lisinipril. This medication also acts to protect your heart and kidneys. We will get some blood work today to check your kidney function. If you experience a cough please stop the medication and call the office. If you experience a sensation like your mouth or throat is swelling, stop the medication immediately and go to the emergency room.  For your headaches, this is probably due to the Chantix. If it becomes intolerable, call the office and we will work on the dosing.  See you again in 1 week for follow up for your blood pressure, your lab results, and your headaches.

## 2014-06-13 NOTE — Assessment & Plan Note (Signed)
Placed referral to dentistry.

## 2014-06-14 LAB — BASIC METABOLIC PANEL
BUN: 17 mg/dL (ref 6–23)
CALCIUM: 9.4 mg/dL (ref 8.4–10.5)
CO2: 28 meq/L (ref 19–32)
CREATININE: 1.05 mg/dL (ref 0.50–1.10)
Chloride: 102 mEq/L (ref 96–112)
Glucose, Bld: 103 mg/dL — ABNORMAL HIGH (ref 70–99)
Potassium: 4.2 mEq/L (ref 3.5–5.3)
SODIUM: 138 meq/L (ref 135–145)

## 2014-06-23 ENCOUNTER — Encounter: Payer: Self-pay | Admitting: Family Medicine

## 2014-06-23 ENCOUNTER — Ambulatory Visit (INDEPENDENT_AMBULATORY_CARE_PROVIDER_SITE_OTHER): Payer: No Typology Code available for payment source | Admitting: Family Medicine

## 2014-06-23 VITALS — BP 120/70 | HR 80 | Ht 64.0 in | Wt 275.0 lb

## 2014-06-23 DIAGNOSIS — Z23 Encounter for immunization: Secondary | ICD-10-CM

## 2014-06-23 DIAGNOSIS — M5430 Sciatica, unspecified side: Secondary | ICD-10-CM

## 2014-06-23 DIAGNOSIS — I509 Heart failure, unspecified: Secondary | ICD-10-CM

## 2014-06-23 DIAGNOSIS — H547 Unspecified visual loss: Secondary | ICD-10-CM | POA: Insufficient documentation

## 2014-06-23 DIAGNOSIS — M5441 Lumbago with sciatica, right side: Secondary | ICD-10-CM

## 2014-06-23 DIAGNOSIS — J984 Other disorders of lung: Secondary | ICD-10-CM

## 2014-06-23 DIAGNOSIS — I5032 Chronic diastolic (congestive) heart failure: Secondary | ICD-10-CM

## 2014-06-23 LAB — BASIC METABOLIC PANEL
BUN: 15 mg/dL (ref 6–23)
CHLORIDE: 104 meq/L (ref 96–112)
CO2: 27 mEq/L (ref 19–32)
Calcium: 9.4 mg/dL (ref 8.4–10.5)
Creat: 1.26 mg/dL — ABNORMAL HIGH (ref 0.50–1.10)
Glucose, Bld: 93 mg/dL (ref 70–99)
Potassium: 4.1 mEq/L (ref 3.5–5.3)
SODIUM: 139 meq/L (ref 135–145)

## 2014-06-23 MED ORDER — CYCLOBENZAPRINE HCL 10 MG PO TABS: 10.0000 mg | ORAL_TABLET | Freq: Three times a day (TID) | ORAL | Status: DC | PRN

## 2014-06-23 NOTE — Assessment & Plan Note (Signed)
Appears stable at this time. Will continue dulera and spiriva. Albuterol prn. Discussed mucinex to help with any productive cough in the future. F/u in one month.

## 2014-06-23 NOTE — Assessment & Plan Note (Signed)
Breathing worsened from her last visit with Dr [name]. Her weight is up to 275 as well. She does not appear very volume overloaded at this time, though the increase in weight is concerning for her volume status moving in the wrong direction. Will increase lasix to 80 in am and 40 in pm. Will continue coreg and lisinopril. Will check BMET today. Will f/u next week to ensure weight is trending in the correct direction.

## 2014-06-23 NOTE — Patient Instructions (Signed)
Nice to see you. Please increase your lasix to 80 mg in the morning and 40 mg in the evening. Do this for the next 3 days then go back to 80 mg once a day. If you develop worsening shortness of breath, chest pain, or continued weight gain with this increase please seek medical attention. For your back pain please continue to do the exercises the PT provided. You can use tylenol 1000 mg up to three times a day. I also sent in a prescription for a muscle relaxer to the pharmacy. Please try this for your back pain.  For your vision you can try some reading glasses from the pharmacy. If you have no improvement with this we will refer to an ophthalmologist.

## 2014-06-23 NOTE — Progress Notes (Signed)
Patient ID: Hailey Barnes, female   DOB: 1961-02-25, 53 y.o.   MRN: [id number]  Hailey Rumps, MD Phone: [phone]  Hailey Barnes is a 53 y.o. female who presents today for f/u.  Diastolic heart failure: patient notes she has had a small increase in her dyspnea since her last visit. She has 3 pillow orthopnea instead of 2 pillow orthopnea. She notes PND as well. She denies resting shortness of breath and did not have shortness of breath with walking in to clinic. She notes mild edema. She is taking lasix 80 mg daily. Limiting fluid to 2 L and watching salt intake. Notes she feels better than the last time I saw her when she was hospitalized.  Back pain: this has continued to bother her despite use of gabapentin. This medication did not help. It only made her sleepy. She notes it is low back pain and occasionally will radiate down her bilateral legs with a sharp pain. Notes she exacerbated her back pain by dancing at a party recently. PT helped and she has continued doing the exercises at home. She denies LE weakness, numbness, fevers, history of cancer, bowel and urine issues. She has not tried any other medications for this.  Restrictive lung disease: notes she has been using her spiriva and dulera. She has not had to use her albuterol in several months. She does note intermittent cough productive of clear sputum. No fevers. No recent trips to the ED exacerbation of lung disease.   Diminished vision: notes her vision has worsened mostly close up, but also is slightly worse on distance. She states she can't read small type close up. She denies any eye pain or redness.  Patient is a smoker.   ROS: Per HPI   Physical Exam Filed Vitals:   06/23/14 1439  BP: 120/70  Pulse: 80  O2 sat 95%  Gen: Well NAD HEENT: PERRL,  MMM, difficult to appreciate JVP with body habitus Lungs: CTABL Nl WOB Heart: RRR no MRG MSK: no tenderness to palpation in midline back or anywhere else  in her back, positive right sided sitting straight leg raise Neuro: 5/5 strength in bilateral quads, hamstrings, plantar and dorsiflexion, sensation to light touch intact in bilateral LE, normal gait, 2+ patellar reflexes Exts: Non edematous BL  LE, warm and well perfused.    Assessment/Plan: Please see individual problem list.  # Healthcare maintenance: flu shot given  Hailey Rumps, MD [place] PGY-3

## 2014-06-23 NOTE — Assessment & Plan Note (Signed)
Likely age related near 65. Vision 20/30 on check in office. Advised on OTC reading glasses. No acute eye issue noted. Will refer to optho for evaluation for glasses.

## 2014-06-23 NOTE — Assessment & Plan Note (Signed)
Continues to have issues with back pain. 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 She has been on gabapentin for sciatica symptoms, though this has not been beneficial. There are no red flags on exam or history. Patient to take 1000 mg tylenol TID scheduled. Flexeril given to take as needed. She is to continue exercises at home. Unfortunately the patient is not a candidate for NSAIDs given her heart failure history. May need referral to ortho for consideration of injections for back pain or further imaging. F/u in one month.

## 2014-06-28 ENCOUNTER — Ambulatory Visit: Payer: No Typology Code available for payment source | Admitting: Family Medicine

## 2014-06-29 ENCOUNTER — Telehealth: Payer: Self-pay | Admitting: *Deleted

## 2014-06-30 ENCOUNTER — Telehealth: Payer: Self-pay | Admitting: Family Medicine

## 2014-06-30 DIAGNOSIS — I10 Essential (primary) hypertension: Secondary | ICD-10-CM

## 2014-06-30 MED ORDER — VARENICLINE TARTRATE 1 MG PO TABS: 1.0000 mg | ORAL_TABLET | Freq: Two times a day (BID) | ORAL | Status: DC

## 2014-06-30 NOTE — Telephone Encounter (Signed)
Refill sent to patients pharmacy listed in epic. Please inform the patient of this. Thanks.

## 2014-06-30 NOTE — Telephone Encounter (Signed)
Called patient to check on her as she missed her last appointment for follow-up of increased swelling. Patient states she cancelled the appointment due to taking some x-lax. She states she is breathing well and has no swelling. She is taking 80 mg lasix in am and 40 mg lasix in pm. I would like for her to come in some time early next week for a lab draw to check a BMET given the increased dose of lasix. She would prefer to do this in the afternoon.

## 2014-06-30 NOTE — Telephone Encounter (Signed)
Called pt, appt made for Monday afternoon. Hailey Barnes, Hailey Barnes

## 2014-07-03 ENCOUNTER — Other Ambulatory Visit: Payer: No Typology Code available for payment source

## 2014-07-03 NOTE — Telephone Encounter (Signed)
Informed pt of refill for Chantix sent to [facility].  Pt stated she would like it called in to the [facility].  Chantix would cost her $600 and at [facility] it is free.  Rx called into [facility] as prescribed by provider.  Derl Barrow, RN

## 2014-07-13 ENCOUNTER — Other Ambulatory Visit: Payer: Medicaid Other

## 2014-07-13 ENCOUNTER — Ambulatory Visit: Payer: Medicaid Other

## 2014-07-13 ENCOUNTER — Ambulatory Visit: Payer: No Typology Code available for payment source | Admitting: Family Medicine

## 2014-07-13 DIAGNOSIS — I10 Essential (primary) hypertension: Secondary | ICD-10-CM

## 2014-07-13 LAB — BASIC METABOLIC PANEL
BUN: 15 mg/dL (ref 6–23)
CHLORIDE: 105 meq/L (ref 96–112)
CO2: 23 meq/L (ref 19–32)
Calcium: 9.4 mg/dL (ref 8.4–10.5)
Creat: 1.12 mg/dL — ABNORMAL HIGH (ref 0.50–1.10)
Glucose, Bld: 75 mg/dL (ref 70–99)
POTASSIUM: 4.6 meq/L (ref 3.5–5.3)
Sodium: 137 mEq/L (ref 135–145)

## 2014-07-13 NOTE — Progress Notes (Signed)
BMP DONE TODAY Hailey Barnes 

## 2014-07-14 ENCOUNTER — Encounter: Payer: Self-pay | Admitting: Family Medicine

## 2014-07-21 ENCOUNTER — Other Ambulatory Visit: Payer: Self-pay | Admitting: *Deleted

## 2014-07-21 MED ORDER — TRIAMCINOLONE ACETONIDE 0.1 % EX CREA: 1.0000 "application " | TOPICAL_CREAM | Freq: Two times a day (BID) | CUTANEOUS | Status: DC

## 2014-08-01 ENCOUNTER — Ambulatory Visit (INDEPENDENT_AMBULATORY_CARE_PROVIDER_SITE_OTHER): Payer: Medicaid Other | Admitting: Family Medicine

## 2014-08-01 VITALS — BP 142/100 | HR 85 | Temp 97.7°F | Wt 272.0 lb

## 2014-08-01 DIAGNOSIS — R21 Rash and other nonspecific skin eruption: Secondary | ICD-10-CM

## 2014-08-01 DIAGNOSIS — L309 Dermatitis, unspecified: Secondary | ICD-10-CM | POA: Diagnosis not present

## 2014-08-01 DIAGNOSIS — I10 Essential (primary) hypertension: Secondary | ICD-10-CM | POA: Diagnosis not present

## 2014-08-01 DIAGNOSIS — I5032 Chronic diastolic (congestive) heart failure: Secondary | ICD-10-CM

## 2014-08-01 DIAGNOSIS — J309 Allergic rhinitis, unspecified: Secondary | ICD-10-CM | POA: Diagnosis not present

## 2014-08-01 LAB — BASIC METABOLIC PANEL
BUN: 19 mg/dL (ref 6–23)
CHLORIDE: 100 meq/L (ref 96–112)
CO2: 31 mEq/L (ref 19–32)
Calcium: 10 mg/dL (ref 8.4–10.5)
Creat: 1.23 mg/dL — ABNORMAL HIGH (ref 0.50–1.10)
GLUCOSE: 94 mg/dL (ref 70–99)
Potassium: 4.1 mEq/L (ref 3.5–5.3)
Sodium: 138 mEq/L (ref 135–145)

## 2014-08-01 MED ORDER — LORATADINE 10 MG PO TABS: 10.0000 mg | ORAL_TABLET | Freq: Every day | ORAL | Status: DC

## 2014-08-01 MED ORDER — TRIAMCINOLONE ACETONIDE 0.1 % EX CREA: 1.0000 "application " | TOPICAL_CREAM | Freq: Two times a day (BID) | CUTANEOUS | Status: DC

## 2014-08-01 MED ORDER — PERMETHRIN 5 % EX CREA: 1.0000 "application " | TOPICAL_CREAM | Freq: Once | CUTANEOUS | Status: DC

## 2014-08-01 MED ORDER — LISINOPRIL 10 MG PO TABS: 10.0000 mg | ORAL_TABLET | Freq: Every day | ORAL | Status: DC

## 2014-08-01 MED ORDER — LORATADINE 10 MG PO TABS
10.0000 mg | ORAL_TABLET | Freq: Every day | ORAL | Status: DC
Start: 2014-08-01 — End: 2014-08-01

## 2014-08-01 NOTE — Assessment & Plan Note (Addendum)
Patient with rash possibly related to scabies given appearance. Will treat with permethrin. Advised to wash all clothes in hot water. Could probably benefit from flea bomb for house. Return to care if not improved with treatment.

## 2014-08-01 NOTE — Assessment & Plan Note (Signed)
Not at goal today. Will increase lisinopril to 10 mg daily. Check BMET today and in one week. F/u in one month for this issue.

## 2014-08-01 NOTE — Assessment & Plan Note (Signed)
Skin findings of eczema. Responded in past to triamcinolone. Will refill this medication.

## 2014-08-01 NOTE — Assessment & Plan Note (Addendum)
Patient with sensation of increased fluid in her abdomen which is typically an indication that she is starting to become volume over loaded. Pulmonary exam is normal making infection or primary lung issue less likely a cause of her dyspnea. Normal coronary arteries on cath 4 months ago and no chest pain make an ischemic cause unlikely. Her O2 sat is normal today and she has no increased work of breathing. She is stable for a trial of outpatient management of this issue. Will increase lasix to 80 mg BID for the next 3 days. Given return precautions. Will see back in 2 days for recheck. Patient now with medicaid. Advised to schedule follow-up with her cardiologist.

## 2014-08-01 NOTE — Patient Instructions (Addendum)
Nice to see you. I think you may be holding on to more fluid than you should. I would like for you to increase your lasix to 80 mg twice a day for the next 3 days.  If you are feeling short of breath at home you can use your albuterol inhaler every six hours as needed.   We will start you on a pill for your allergies.  If your fatigue or shortness of breath worsen, you develop chest pain, fever, or productive cough please seek medical attention.  I increased one of your blood pressure medications called lisinopril to 10 mg daily.

## 2014-08-01 NOTE — Progress Notes (Signed)
Patient ID: Hailey Barnes, female   DOB: November 07, 1960, 53 y.o.   MRN: [id number]  Hailey Rumps, MD Phone: [phone]  Hailey Barnes is a 53 y.o. female who presents today for f/u.  HYPERTENSION Disease Monitoring Home BP Monitoring not checking Chest pain- no    Dyspnea- see below Medications Compliance-  taking.   Edema- mild  Dyspnea: patient reports recently being out of breath. She states she can feel fluid building up in her abdomen, which is where she typically accumulates her fluid, and to a small degree in her feet. She notes she gets more short of breath walking around. She notes increased orthopnea and is sleeping on 3 pillows now. She notes this is not as bad as the last time she was hospitalized She notes she is using her dulera and spiriva each day. She has not had to use her albuterol in some time. There is a mild cough with this. Note patient had a cardiac catheterization on 03/31/14 that revealed clear coronaries.   Sore throat: notes she has had post nasal drip, rhinorrhea, watery eyes, and sore throat with cough for "a while." She denies nasal congestion and ear fullness. She is not taking any medication for this.   Rash: notes she has had eczematous rash on her arms and back of her neck. This responds to the triamcinolone though she ran out of this. She notes an additional rash on her back, arms, and breasts that are small bumps that itch. She notes this rash arose after going to a friends house and laying on his bed. She tried the triamcinolone on this rash with no benefit. She has not seen any bed bugs at her house and reports having a brand new mattress.    ROS: Per HPI   Physical Exam Filed Vitals:   08/01/14 1521  BP: 142/100  Pulse: 85  Temp: 97.7 F (36.5 C)    Gen: Well NAD HEENT: PERRL,  MMM, bilateral TMs normal, OP with mild cobblestoning, no exudate Lungs: CTABL Nl WOB Heart: RRR no MRG Abd: soft, NT, ND Skin: bilateral flexor and  extensor elbows and back of neck with signs of eczema, her back and arms and chest have scattered small excoriated papules Exts: trace BL  LE, warm and well perfused.    Assessment/Plan: Please see individual problem list.   Hailey Rumps, MD [place] PGY-3

## 2014-08-01 NOTE — Assessment & Plan Note (Signed)
Patient with signs and symptoms of allergic rhinitis. Will start on claratin daily. F/u in one month for this issue.

## 2014-08-03 ENCOUNTER — Encounter: Payer: Self-pay | Admitting: Family Medicine

## 2014-08-03 ENCOUNTER — Ambulatory Visit (INDEPENDENT_AMBULATORY_CARE_PROVIDER_SITE_OTHER): Payer: Medicaid Other | Admitting: Family Medicine

## 2014-08-03 VITALS — BP 135/85 | HR 75 | Temp 98.0°F | Ht 64.0 in | Wt 275.6 lb

## 2014-08-03 DIAGNOSIS — I5032 Chronic diastolic (congestive) heart failure: Secondary | ICD-10-CM | POA: Diagnosis not present

## 2014-08-03 MED ORDER — TORSEMIDE 100 MG PO TABS: 100.0000 mg | ORAL_TABLET | Freq: Every day | ORAL | Status: DC

## 2014-08-03 NOTE — Progress Notes (Signed)
Patient ID: Hailey Barnes, female   DOB: May 02, 1961, 53 y.o.   MRN: [id number]  Tommi Rumps, MD Phone: [phone]  Hailey Barnes Hailey Barnes is a 53 y.o. female who presents today for same day appointment.  Dyspnea: patient seen 2 days ago for same issue. Advised to increase lasix to 80 mg BID. Patient continues to have shortness of breath and complains of a heavy feeling in her chest. She notes the shortness of breath is only when she gets up to move around. She notes continued orthopnea and added a 3rd pillow ~1 week ago. She denies fevers. She notes the albuterol helped some and the lasix did not make much of a difference. She states she does not feel to the point that she needs to go to the hospital for treatment.   Patient is a smoker.   ROS: Per HPI   Physical Exam Filed Vitals:   08/03/14 1141  BP: 135/85  Pulse: 75  Temp: 98 F (36.7 C)  Ambulatory O2 99%  Gen: tired appearing NAD HEENT: PERRL,  MMM Lungs: CTABL Nl WOB Heart: RRR no MRG Exts: trace edema BL  LE, warm and well perfused.    Assessment/Plan: Please see individual problem list.   Tommi Rumps, MD [place] PGY-3

## 2014-08-03 NOTE — Patient Instructions (Signed)
Nice to see you. Please start taking the demadex. This replaces your lasix.  If your shortness of breath gets worse or you develop chest pain please go to the ED.   Shortness of Breath Shortness of breath means you have trouble breathing. Shortness of breath needs medical care right away. HOME CARE   Do not smoke.  Avoid being around chemicals or things (paint fumes, dust) that may bother your breathing.  Rest as needed. Slowly begin your normal activities.  Only take medicines as told by your doctor.  Keep all doctor visits as told. GET HELP RIGHT AWAY IF:   Your shortness of breath gets worse.  You feel lightheaded, pass out (faint), or have a cough that is not helped by medicine.  You cough up blood.  You have pain with breathing.  You have pain in your chest, arms, shoulders, or belly (abdomen).  You have a fever.  You cannot walk up stairs or exercise the way you normally do.  You do not get better in the time expected.  You have a hard time doing normal activities even with rest.  You have problems with your medicines.  You have any new symptoms. MAKE SURE YOU:  Understand these instructions.  Will watch your condition.  Will get help right away if you are not doing well or get worse. Document Released: 03/24/2008 Document Revised: 10/11/2013 Document Reviewed: 12/22/2011 [facility] Patient Information 2015 [place], [place]. This information is not intended to replace advice given to you by your health care provider. Make sure you discuss any questions you have with your health care provider.

## 2014-08-03 NOTE — Assessment & Plan Note (Signed)
Patient with continued dyspnea felt to be related to volume overload. Her weight is up 3 lbs from 2 days ago likely giving her the sensation of heaviness in her chest. Of note she had a cardiac cath 4 months ago that revealed no CAD making ischemic cause unlikely. Pulmonary exam remains normal on exam today and no desats making primary pulmonary issue less likely. She continues to have no oxygen desats making her stable for outpatient management. After discussion with Dr Andria Frames, we switched the patient to demadex 100 mg daily given its better intestinal bioavailability. D/c lasix. She is to take demadex this afternoon. She will return tomorrow for a nurse visit for a weight check to ensure good diuresis and improved breathing. If all is progressing well tomorrow the patient will follow-up in the office early next week. Will need to obtain a BMET at that time. Given return precautions.

## 2014-08-04 ENCOUNTER — Ambulatory Visit (INDEPENDENT_AMBULATORY_CARE_PROVIDER_SITE_OTHER): Payer: Medicaid Other | Admitting: *Deleted

## 2014-08-04 ENCOUNTER — Telehealth: Payer: Self-pay | Admitting: Family Medicine

## 2014-08-04 VITALS — BP 140/98 | HR 98 | Wt 271.3 lb

## 2014-08-04 DIAGNOSIS — I10 Essential (primary) hypertension: Secondary | ICD-10-CM

## 2014-08-04 NOTE — Progress Notes (Signed)
   Pt in nurse clinic for blood pressure and weight check.  BP 140/98 manually, heart rate 98 and weight 271.3 lb.  Pt denies any chest pain today.  Will forward to PCP.  Derl Barrow, RN

## 2014-08-04 NOTE — Telephone Encounter (Signed)
Called patient to see why she missed her appointment in nurse clinic. She informed me she was in the front office waiting to be seen at this time.

## 2014-08-29 ENCOUNTER — Other Ambulatory Visit: Payer: Self-pay | Admitting: *Deleted

## 2014-08-29 MED ORDER — ALBUTEROL SULFATE HFA 108 (90 BASE) MCG/ACT IN AERS: 2.0000 | INHALATION_SPRAY | Freq: Two times a day (BID) | RESPIRATORY_TRACT | Status: DC

## 2014-09-07 ENCOUNTER — Other Ambulatory Visit: Payer: Self-pay | Admitting: *Deleted

## 2014-09-07 MED ORDER — VARENICLINE TARTRATE 1 MG PO TABS: 1.0000 mg | ORAL_TABLET | Freq: Two times a day (BID) | ORAL | Status: DC

## 2014-09-25 ENCOUNTER — Encounter: Payer: Self-pay | Admitting: Family Medicine

## 2014-09-25 ENCOUNTER — Ambulatory Visit (INDEPENDENT_AMBULATORY_CARE_PROVIDER_SITE_OTHER): Payer: Medicaid Other | Admitting: Family Medicine

## 2014-09-25 VITALS — BP 119/80 | HR 96 | Temp 97.9°F | Wt 279.0 lb

## 2014-09-25 DIAGNOSIS — J069 Acute upper respiratory infection, unspecified: Secondary | ICD-10-CM

## 2014-09-25 DIAGNOSIS — B9789 Other viral agents as the cause of diseases classified elsewhere: Principal | ICD-10-CM

## 2014-09-25 NOTE — Patient Instructions (Signed)
Thank you for coming in, today!  I think you have a virus causing your symptoms. Antibiotics and anti-flu medicines won't help at this point. The good news is I don't think this is heart failure. Keep taking your regular medicines. You can take over-the-counter symptoms medicines (Mucinex for congestion, Cepacol throat spray, and so on). Ask your pharmacist if there are specific things to try for you with your blood pressure.  If you're not feeling better or if you feel worse by Thursday, call or come back in Thursday or Friday. Please feel free to call with any questions or concerns at any time, at [phone]. --Dr. Venetia Maxon

## 2014-09-25 NOTE — Progress Notes (Signed)
   Subjective:    Patient ID: Hailey Barnes, female    DOB: Aug 25, 1961, 53 y.o.   MRN: [id number]  HPI: Pt presents to [facility] for about 1 week of cough, congestion, and SOB. She states her symptoms have gradually been worse. She has had very poor energy. Her symptoms started with a sore throat that then got dry, and she has been very hoarse. She is short of breath at rest "sometimes" and has some tightness in her chest with activity that "comes and goes," but this is vaguely described. She denies frank chest pain. She has some productive cough with clear phlegm. She has had a flu shot and a pneumonia shot this year. She has not had fever.  Of note, pt has CHF and reports compliance with her regular medications including Demedex 100 mg once a day, though she does occasionally miss a day. She denies increased leg swelling which she typically gets when her heart failure is in exacerbation, but she does feel better sitting up at night to sleep (4 pillows).  Review of Systems: As above.     Objective:   Physical Exam BP 119/80 mmHg  Pulse 96  Temp(Src) 97.9 F (36.6 C) (Oral)  Wt 279 lb (126.554 kg)  SpO2 92% Gen: ill but non-toxic-appearing adult female in NAD; voice very hoarse HEENT: [place]/AT, EOMI, PERRLA, MMM, TM's clear bilaterally  S/p tonsillectomy  Minimal sinus tenderness to direct palpation  Nasal and posterior oropharyngeal mucosae moderately red / inflamed Neck: supple, mild shotty anterior lymphadenopathy Cardio: distant heart sounds secondary to body habitus but no frank murmur appreciated Pulm: CTAB though body habitus limits exam; no basilar crackles appreciated, good bilateral air movement Ext: warm, well-perfused, minimal bilateral LE edema at ankles     Assessment & Plan:  53yo with likely viral URI with cough, possibly flu but unlikely; granddaughter with similar, milder symptoms - outside of window of benefit of Tamiflu; exam does not suggest frank CHF  exacerbation - recommended continued current long-term medications and use of appropriate OTC symptom medicines - recommended Mucinex without dextromethorphan, discussing with her outpt pharmacist what meds they carry appropriate for people with HTN, etc - f/u with PCP Dr. Caryl Bis as needed - advised symptoms should be improving by day 10 of illness and to f/u again at that time if she is no better - recommended representation to clinic or the ED if her symptoms worsen  FYI to Dr. Alfonse Flavors, MD PGY-3, [facility] 09/25/2014, 10:18 PM

## 2014-09-28 ENCOUNTER — Encounter (HOSPITAL_COMMUNITY): Payer: Self-pay | Admitting: Cardiovascular Disease

## 2014-10-04 ENCOUNTER — Encounter: Payer: Self-pay | Admitting: Family Medicine

## 2014-10-04 ENCOUNTER — Ambulatory Visit (INDEPENDENT_AMBULATORY_CARE_PROVIDER_SITE_OTHER): Payer: Medicaid Other | Admitting: Family Medicine

## 2014-10-04 VITALS — BP 152/100 | HR 99 | Temp 97.7°F | Ht 64.0 in | Wt 280.6 lb

## 2014-10-04 DIAGNOSIS — J029 Acute pharyngitis, unspecified: Secondary | ICD-10-CM

## 2014-10-04 DIAGNOSIS — I5032 Chronic diastolic (congestive) heart failure: Secondary | ICD-10-CM

## 2014-10-04 LAB — POCT RAPID STREP A (OFFICE): RAPID STREP A SCREEN: NEGATIVE

## 2014-10-04 MED ORDER — TORSEMIDE 100 MG PO TABS: 100.0000 mg | ORAL_TABLET | Freq: Every day | ORAL | Status: DC

## 2014-10-04 NOTE — Progress Notes (Signed)
Patient ID: Hailey Barnes, female   DOB: 1961/08/28, 53 y.o.   MRN: [id number]   [facility] Bernadene Bell, MD Phone: [phone]  Subjective:   Hailey Barnes is a 53 y.o F who presents with sore throat.Was seen by Dr. Venetia Maxon for 1 week of cough congestion and SOB on 12/7. At that time was diagnosed with URI with cough. Pt with known hx of CHF and has been compliant with meds including Demedex 122m. Did not take today. Does not weight herself regularly but feels that she has had extra fluid on board lately, including leg swelling and abdominal fullness. Reports part of her throat feeling raw and red and other parts feeling wet.   All relevant systems were reviewed and were negative unless otherwise noted in the HPI  Past Medical History Reviewed problem list.  Medications- reviewed and updated Current Outpatient Prescriptions  Medication Sig Dispense Refill  . acetaminophen (TYLENOL) 500 MG tablet Take 1,000 mg by mouth every 8 (eight) hours as needed for moderate pain.     .Marland Kitchenalbuterol (PROVENTIL HFA;VENTOLIN HFA) 108 (90 BASE) MCG/ACT inhaler Inhale 2 puffs into the lungs 2 (two) times daily. 2 Inhaler 1  . aspirin 81 MG tablet Take 81 mg by mouth daily.    . carvedilol (COREG) 25 MG tablet Take 25 mg by mouth 2 (two) times daily with a meal.    . cyclobenzaprine (FLEXERIL) 10 MG tablet Take 1 tablet (10 mg total) by mouth 3 (three) times daily as needed for muscle spasms. 30 tablet 0  . gabapentin (NEURONTIN) 100 MG capsule Take 100 mg by mouth 3 (three) times daily.    .Marland Kitchenibuprofen (ADVIL,MOTRIN) 200 MG tablet Take 400 mg by mouth every 6 (six) hours as needed (pain).    .Marland Kitchenlisinopril (PRINIVIL,ZESTRIL) 10 MG tablet Take 1 tablet (10 mg total) by mouth daily. 30 tablet 2  . loratadine (CLARITIN) 10 MG tablet Take 1 tablet (10 mg total) by mouth daily. 30 tablet 11  . mometasone-formoterol (DULERA) 200-5 MCG/ACT AERO Inhale 2 puffs into the lungs 2 (two)  times daily.    . permethrin (ELIMITE) 5 % cream Apply 1 application topically once. Apply cream from head to toe; leave on for 8-14 hours before washing off 60 g 0  . spironolactone (ALDACTONE) 25 MG tablet Take 25 mg by mouth daily.    .Marland Kitchentiotropium (SPIRIVA) 18 MCG inhalation capsule Place 18 mcg into inhaler and inhale daily.    .Marland Kitchentorsemide (DEMADEX) 100 MG tablet Take 1 tablet (100 mg total) by mouth daily. 30 tablet 1  . triamcinolone cream (KENALOG) 0.1 % Apply 1 application topically 2 (two) times daily. 30 g 0  . varenicline (CHANTIX) 1 MG tablet Take 1 tablet (1 mg total) by mouth 2 (two) times daily. 120 tablet 0   No current facility-administered medications for this visit.   Chief complaint-noted No additions to family history Social history- patient is a current every day smoker  Objective: BP 152/100 mmHg  Pulse 99  Temp(Src) 97.7 F (36.5 C) (Oral)  Ht 5' 4"  (1.626 m)  Wt 280 lb 9.6 oz (127.279 kg)  BMI 48.14 kg/m2 Gen: NAD, alert, cooperative with exam, coughing  HEENT: NCAT, EOMI, PERRL, TMs nml, very erythematous throat  Neck: FROM, supple, shoddy nodes bilaterally  CV: distant RRR, good S1/S2, no murmur, cap refill <3 Resp: coarse breath sounds with some bibasilar crackles, no wheezes, non-labored Abd: SNTND, BS present, no guarding or  organomegaly Ext: No edema, warm, normal tone, moves UE/LE spontaneously Neuro: Alert and oriented, No gross deficits Skin: no rashes no lesions  Assessment/Plan: See problem based a/p

## 2014-10-04 NOTE — Assessment & Plan Note (Signed)
Concern now for exacerbation with weight up now 5 lbs since last seen and heavy sensation in chest No chest pain to warrant w/up for MI today Will increase torsemide to 13m X2 for the next two days Bring back on Friday with Dr. HAwanda Minkto obtain bmet and recheck Advised patient to go to ED if breathing worsened prior to then Pt well aware of reasons to rtc Otherwise rapid strep neg Sent throat culture Will cont supportive care with mucinex and tessalon as well as lozenges  Needs to watch weights closely

## 2014-10-04 NOTE — Patient Instructions (Signed)
Ms Carter-Schoenberger it was great to meet you today!  I am sorry that you are not feeling well For the next two days increase torsamide to 1.5 tabs daily  Come in 2 days to re-evaluate and check kidneys   Please return to clinic if symptoms do not improve or worsen Feel better soon Bernadene Bell, MD

## 2014-10-05 LAB — STREP A DNA PROBE: GASP: NEGATIVE

## 2014-10-06 ENCOUNTER — Ambulatory Visit (INDEPENDENT_AMBULATORY_CARE_PROVIDER_SITE_OTHER): Payer: Medicaid Other | Admitting: Family Medicine

## 2014-10-06 ENCOUNTER — Ambulatory Visit: Payer: Medicaid Other | Admitting: Family Medicine

## 2014-10-06 ENCOUNTER — Ambulatory Visit
Admission: RE | Admit: 2014-10-06 | Discharge: 2014-10-06 | Disposition: A | Discharge status: Home / Self Care | Payer: Medicaid Other | Source: Ambulatory Visit | Attending: Family Medicine | Admitting: Family Medicine

## 2014-10-06 VITALS — BP 137/80 | HR 104 | Temp 98.1°F | Wt 280.5 lb

## 2014-10-06 DIAGNOSIS — J441 Chronic obstructive pulmonary disease with (acute) exacerbation: Secondary | ICD-10-CM | POA: Insufficient documentation

## 2014-10-06 DIAGNOSIS — I5032 Chronic diastolic (congestive) heart failure: Secondary | ICD-10-CM

## 2014-10-06 LAB — BASIC METABOLIC PANEL
BUN: 23 mg/dL (ref 6–23)
CALCIUM: 9.3 mg/dL (ref 8.4–10.5)
CHLORIDE: 101 meq/L (ref 96–112)
CO2: 26 meq/L (ref 19–32)
CREATININE: 1.5 mg/dL — AB (ref 0.50–1.10)
Glucose, Bld: 123 mg/dL — ABNORMAL HIGH (ref 70–99)
Potassium: 3.6 mEq/L (ref 3.5–5.3)
Sodium: 139 mEq/L (ref 135–145)

## 2014-10-06 MED ORDER — DOXYCYCLINE HYCLATE 100 MG PO TABS: 100.0000 mg | ORAL_TABLET | Freq: Two times a day (BID) | ORAL | Status: DC

## 2014-10-06 MED ORDER — IPRATROPIUM BROMIDE 0.02 % IN SOLN
0.5000 mg | Freq: Once | RESPIRATORY_TRACT | Status: AC
  Administered 2014-10-06: 0.5 mg via RESPIRATORY_TRACT

## 2014-10-06 MED ORDER — PREDNISONE 50 MG PO TABS: 50.0000 mg | ORAL_TABLET | Freq: Every day | ORAL | Status: DC

## 2014-10-06 MED ORDER — ALBUTEROL SULFATE (2.5 MG/3ML) 0.083% IN NEBU
2.5000 mg | INHALATION_SOLUTION | Freq: Once | RESPIRATORY_TRACT | Status: AC
  Administered 2014-10-06: 2.5 mg via RESPIRATORY_TRACT

## 2014-10-06 NOTE — Patient Instructions (Signed)
It was great seeing you today.   1. Return to 177m torsemide daily. We checked your blood work today. 2. Make sure you limit your fluid intake (no more than 2 liters a day) 3. Start the doxycyline two times a day, and prednisone once a day [facility]. [facility] 5. Continue to use the albuterol every 4 hours as needed (can use 1-4 puffs) 6. Follow up on Monday in clinic, or if you worsen go to the Emergency Room over the weekend    If we did any lab work today, if it is normal you can expect a letter in the mail.  Please bring all your medications to every doctors visit  Sign up for My Chart to have easy access to your labs results, and communication with your Primary care physician.    Please check-out at the front desk before leaving the clinic.   I look forward to talking with you again at our next visit. If you have any questions or concerns before then, please call the clinic at [phone]  Take Care,   Dr. ATawanna Sat

## 2014-10-06 NOTE — Progress Notes (Signed)
   Subjective:    Patient ID: Hailey Barnes, female    DOB: [birth year], 53 y.o.   MRN: [id number]  HPI  CC: short of breath  # Dyspnea, cough:  Seen 12/7 and 12/16 for similar symptoms, diagnosed with viral URI and fluid overload due to CHF  Torsemide Increased to 215m for 2 days, not doing any better breathing wise, but legs less swollen. Urinating more/definitely responding to torsemide  Chest tightness with cough  Cough has been productive of yellow-green sputum  Has been dizzy when standing up, feels dehydrated  Total duration of symptoms now about 3 weeks  Reports her dry weight to be 272lbs, 280lbs today unchanged from 2 days ago  States she feels like she did when she was hospitalized in August for CHF exacerbation, says she might need to be admitted ROS: no fevers or chills, no nausea/vomiting, +SOB, +CP, +dizziness Relevant SH: patient continues to smoke  Review of Systems   See HPI for ROS. All other systems reviewed and are negative.  Past medical history, surgical, family, and social history reviewed and updated in the EMR as appropriate. Objective:  BP 137/80 mmHg  Pulse 104  Temp(Src) 98.1 F (36.7 C) (Oral)  Wt 280 lb 8 oz (127.234 kg) Vitals reviewed  General: appears mildly distressed but improves in appearance during visit HEENT: PERRL, EOMI. MMM. CV: tachy but regular, quiet s1s2, no murmur appreciated Resp: decreased air movement bilaterally, no appreciable crackles at bases. End expiratory wheezes, rhonchi throughout. Ext: 1+ pitting edema bilaterally up to shin. Warm well perfused Neuro: alert and oriented, no focal deficits  Assessment & Plan:  See Problem List Documentation

## 2014-10-09 ENCOUNTER — Telehealth: Payer: Self-pay | Admitting: Family Medicine

## 2014-10-09 ENCOUNTER — Encounter: Payer: Self-pay | Admitting: Family Medicine

## 2014-10-09 DIAGNOSIS — J441 Chronic obstructive pulmonary disease with (acute) exacerbation: Secondary | ICD-10-CM

## 2014-10-09 MED ORDER — PREDNISONE 50 MG PO TABS: 50.0000 mg | ORAL_TABLET | Freq: Every day | ORAL | Status: DC

## 2014-10-09 NOTE — Assessment & Plan Note (Addendum)
Dyspnea, likely multifactorial, treated for CHF exacerbation with torsemide 253m x 2 days with reported adequate urinary output but no change in weight, no endorsing some orthostatic symptoms. Suspect her true dry weight may not be 272lbs as patient states. On exam no real appreciable crackles at bases. Suspect with increased cough and sputum production may be COPD exacerbation contributing. Will try to treat as outpatient and avoid ED/hospital. Plan: CXR, prednisone 55mx 5 days, doxycyline x 7 days, f/u early next week. Red flags discussed and patient agreeable to plan

## 2014-10-09 NOTE — Telephone Encounter (Signed)
Pt called and needs [place] to call the pharmacy or send a new prescription for her prednisone in. Her Medicaid card says Hailey Barnes only on it and the prescription we called in says [facility] so they can not fill and use her Medicaid card. jw

## 2014-10-09 NOTE — Telephone Encounter (Signed)
Will resend prescription for prednisone 50 mg daily for 5 days. Please inform the patient that this has been done.

## 2014-10-09 NOTE — Telephone Encounter (Signed)
Pt is aware that rx was called in under the corrected name. Phong Isenberg,CMA

## 2014-10-10 ENCOUNTER — Encounter: Payer: Self-pay | Admitting: Family Medicine

## 2014-10-10 ENCOUNTER — Ambulatory Visit (INDEPENDENT_AMBULATORY_CARE_PROVIDER_SITE_OTHER): Payer: Medicaid Other | Admitting: Family Medicine

## 2014-10-10 VITALS — BP 141/96 | HR 101 | Temp 98.2°F | Ht 64.0 in | Wt 277.0 lb

## 2014-10-10 DIAGNOSIS — R0981 Nasal congestion: Secondary | ICD-10-CM

## 2014-10-10 DIAGNOSIS — R1011 Right upper quadrant pain: Secondary | ICD-10-CM

## 2014-10-10 DIAGNOSIS — J441 Chronic obstructive pulmonary disease with (acute) exacerbation: Secondary | ICD-10-CM

## 2014-10-10 LAB — COMPREHENSIVE METABOLIC PANEL
ALBUMIN: 4.2 g/dL (ref 3.5–5.2)
ALT: 36 U/L — ABNORMAL HIGH (ref 0–35)
AST: 26 U/L (ref 0–37)
Alkaline Phosphatase: 80 U/L (ref 39–117)
BUN: 23 mg/dL (ref 6–23)
CALCIUM: 10.1 mg/dL (ref 8.4–10.5)
CHLORIDE: 98 meq/L (ref 96–112)
CO2: 26 mEq/L (ref 19–32)
Creat: 1.41 mg/dL — ABNORMAL HIGH (ref 0.50–1.10)
Glucose, Bld: 155 mg/dL — ABNORMAL HIGH (ref 70–99)
POTASSIUM: 3.8 meq/L (ref 3.5–5.3)
SODIUM: 136 meq/L (ref 135–145)
Total Bilirubin: 0.3 mg/dL (ref 0.2–1.2)
Total Protein: 7.4 g/dL (ref 6.0–8.3)

## 2014-10-10 MED ORDER — FLUTICASONE PROPIONATE 50 MCG/ACT NA SUSP: 2.0000 | Freq: Every day | NASAL | Status: DC

## 2014-10-10 NOTE — Patient Instructions (Addendum)
Good to see you.  Your lungs have mild wheezing but are moving good air. The coughing may take some time to improve as I think this started with a viral illness. Continue tessalon perles. Continue doxycycline for full 7 days and prednisone for full 5 days.  Drink plenty of fluids and get rest. You can take tylenol for pain from coughing. Continue to weigh yourself at home and for the next 1 day double your torsemide. Seek immediate care if you have severe chest pain, trouble breathing, fevers, or other concerns. Otherwise, follow up in 1 more week if not feeling better yet.  Best,  [place] Sinclair, MD

## 2014-10-10 NOTE — Assessment & Plan Note (Addendum)
Previously had this issue, fatty liver seen on RUQ [place] 12/2013. Mildly worsened pain past few days with nausea (likely from swallowing mucus) though no vomiting/diarrhea. Likely MSK due to coughing. - CMET due to pt concern today. - F/u with PCP.

## 2014-10-10 NOTE — Assessment & Plan Note (Signed)
Likely COPD exacerbation from viral URI with cough and nasal congestion. Just able to obtain doxycycline and prednisone yesterday. Afebrile, reports continued cough but symptoms already improving. Increased sputum production still. Afebrile with normal vitals and good O2 sat. Some reported PND and recent mild fluid overload, though CXR clear and lungs without crackles today. - Continue prednisone and doxycycline for bursts along with breathing treatments. - Rest, fluids, tessalon perles PRN, tylenol PRN pain from coughing. - Weigh self daily. Increase torsemide to 262m x 1 day. - keep working on quitting smoking. - F/u in 1 week PRN. Return precautions reviewed.

## 2014-10-10 NOTE — Progress Notes (Signed)
Patient ID: Hailey Barnes, female   DOB: 1961/04/27, 53 y.o.   MRN: [id number] Subjective:   CC: Follow up COPD exacerbation  HPI:   F/u COPD exacerbation 53 y.o. female here for f/u of dyspnea. Had recently been treated for CHF exacerbation with increased torsemide 2 days. Then at visit 4 days ago had no crackles and had increaseds sputum production thought to be COPD exacerbation, so treated with prednisone 5 days, doxycycline 7 days, and CXR which was unremarkable.  Since that time, she was just able to obtain prednisone and doxycycline yesterday. She states that just with 1 dose, she already feels like her lungs' congestion is "breaking up" and is feeling better. She is coughing up "sticky" sputum. She is breathing easier. Tessalon perles are helping with cough. Denies fevers or chills. Has lost 3 lbs since  Last visit here per pt but still feels mildly overweight comapred to baseline. Sleeping with pillows stacked because otherwise she feels "strangled." Also wakes coughing. Denies LE swelling. Using albuterol q4 hours. Is a smoker but has not smoked with acute illness. Also working hard to quit and has cut back from 1 ppd to 5 cig/day.   RUQ abdominal pain Per patient, she had h/o fatty liver. On review, 12/2013 abdominal US confirms this. She reports a few adys of right upper quadrant pain with no fevers, chills, vomiting, or diarrhea. Some nausea though she attributes this to swallowing lots of phlegm.  Review of Systems - Per HPI.   PMH - tobacco dependence, RUQ pain, obesity, COPD, diastolic dysfunction, back pain, allergic rhinitis, HTN, NASH Flu and pneumonia shot today. Smoking status: current every day smoker 5 cigarettes/day.    Objective:  Physical Exam BP 141/96 mmHg  Pulse 101  Temp(Src) 98.2 F (36.8 C) (Oral)  Ht 5' 4"  (1.626 m)  Wt 277 lb (125.646 kg)  BMI 47.52 kg/m2  SpO2 94% GEN: NAD CV: RRR, no m/r/g, 2+ bilateral radial pulses PULM: CTAB with good air  entry however mild wheeze end expiratory, wet-sounding intermittent cough ABD: S/mild RUQ tenderness/ND, no obvious organomegaly though difficult with obesity. HEENT: Hoarse-sounding EXTR: No LE edema or calf tenderenss    Assessment:     Hailey Barnes is a 53 y.o. female here for f/u COPD exacerbation.    Plan:     # See problem list and after visit summary for problem-specific plans.   # Health Maintenance: Not discussed.  Follow-up: Follow up in 1 week PRN for lack of improvement.   Hilton Sinclair, MD [place]

## 2014-10-11 ENCOUNTER — Telehealth: Payer: Self-pay | Admitting: Family Medicine

## 2014-10-11 DIAGNOSIS — R7989 Other specified abnormal findings of blood chemistry: Secondary | ICD-10-CM

## 2014-10-11 NOTE — Telephone Encounter (Signed)
Pt is aware of this.  She voiced understanding on increasing her fluid intake.  She doesn't take ibuprofen anyway.  Appt made for Monday afternoon for labs. Cimberly Stoffel,CMA

## 2014-10-11 NOTE — Telephone Encounter (Signed)
Please call to let Hailey Barnes know that liver function is just barely elevated and kidney function has remained just barely elevated. It is important for her kidneys that she stay hydrated. I would also like her to hold lisinopril for 2-3 days and not take ibuprofen for at least 1 week. She should come in early next week for complete metabolic panel follow up which I will order.  H[place] Sinclair, MD

## 2014-10-16 ENCOUNTER — Other Ambulatory Visit (INDEPENDENT_AMBULATORY_CARE_PROVIDER_SITE_OTHER): Payer: Medicaid Other

## 2014-10-16 DIAGNOSIS — R7989 Other specified abnormal findings of blood chemistry: Secondary | ICD-10-CM

## 2014-10-16 NOTE — Progress Notes (Signed)
CMP DONE TODAY Hailey Barnes

## 2014-10-17 ENCOUNTER — Encounter: Payer: Self-pay | Admitting: Family Medicine

## 2014-10-17 LAB — COMPREHENSIVE METABOLIC PANEL
ALK PHOS: 78 U/L (ref 39–117)
ALT: 20 U/L (ref 0–35)
AST: 17 U/L (ref 0–37)
Albumin: 4 g/dL (ref 3.5–5.2)
BILIRUBIN TOTAL: 0.3 mg/dL (ref 0.2–1.2)
BUN: 22 mg/dL (ref 6–23)
CO2: 23 mEq/L (ref 19–32)
CREATININE: 1.34 mg/dL — AB (ref 0.50–1.10)
Calcium: 9.5 mg/dL (ref 8.4–10.5)
Chloride: 98 mEq/L (ref 96–112)
GLUCOSE: 91 mg/dL (ref 70–99)
POTASSIUM: 3.8 meq/L (ref 3.5–5.3)
Sodium: 139 mEq/L (ref 135–145)
Total Protein: 7.1 g/dL (ref 6.0–8.3)

## 2014-11-01 ENCOUNTER — Telehealth: Payer: Self-pay | Admitting: *Deleted

## 2014-11-01 NOTE — Telephone Encounter (Signed)
Patient should continue on chantix. Please let [facility] know. Thanks.

## 2014-11-01 NOTE — Telephone Encounter (Signed)
Received fax from [facility] asking if pt should still be taking Chantix?  Pt's first starter pack picked up 06/07/14.  Please call [phone].  Derl Barrow, RN

## 2014-11-02 NOTE — Telephone Encounter (Signed)
Left voice message on [facility] line informing them that pt should continue on Chantix.  Derl Barrow, RN

## 2014-11-29 ENCOUNTER — Encounter: Payer: Self-pay | Admitting: Family Medicine

## 2014-11-29 ENCOUNTER — Ambulatory Visit (INDEPENDENT_AMBULATORY_CARE_PROVIDER_SITE_OTHER): Payer: Medicaid Other | Admitting: Family Medicine

## 2014-11-29 VITALS — BP 111/74 | HR 84 | Temp 98.2°F | Ht 64.0 in | Wt 284.2 lb

## 2014-11-29 DIAGNOSIS — N898 Other specified noninflammatory disorders of vagina: Secondary | ICD-10-CM

## 2014-11-29 DIAGNOSIS — I5032 Chronic diastolic (congestive) heart failure: Secondary | ICD-10-CM

## 2014-11-29 DIAGNOSIS — B9789 Other viral agents as the cause of diseases classified elsewhere: Secondary | ICD-10-CM | POA: Insufficient documentation

## 2014-11-29 DIAGNOSIS — I10 Essential (primary) hypertension: Secondary | ICD-10-CM

## 2014-11-29 DIAGNOSIS — L298 Other pruritus: Secondary | ICD-10-CM

## 2014-11-29 DIAGNOSIS — B349 Viral infection, unspecified: Secondary | ICD-10-CM

## 2014-11-29 DIAGNOSIS — J329 Chronic sinusitis, unspecified: Secondary | ICD-10-CM

## 2014-11-29 MED ORDER — LISINOPRIL 10 MG PO TABS: 10.0000 mg | ORAL_TABLET | Freq: Every day | ORAL | Status: DC

## 2014-11-29 MED ORDER — SPIRONOLACTONE 25 MG PO TABS: 25.0000 mg | ORAL_TABLET | Freq: Every day | ORAL | Status: DC

## 2014-11-29 MED ORDER — FLUCONAZOLE 150 MG PO TABS: 150.0000 mg | ORAL_TABLET | Freq: Once | ORAL | Status: DC

## 2014-11-29 MED ORDER — TORSEMIDE 100 MG PO TABS: 100.0000 mg | ORAL_TABLET | Freq: Every day | ORAL | Status: DC

## 2014-11-29 NOTE — Patient Instructions (Addendum)
You have a viral upper respiratory infection with mild fluid overload symptoms. Rest, hydrate, and use warm liquids, honey, losenges, and salt water gargles for your throat. Follow up in 1 week to see how you are doing. You also need to take torsemide daily as prescribed as you are describing mild fluid overload symptoms. This does not sound like a COPD exacerbation, but take albuterol every 4 hours as needed and keep taking spiriva and dulera daily. Keep trying to quit. There is a free help line with professionals always available - 1-800-QUIT-NOW.  Take diflucan for the vaginal itching. Come back if not improving in 1 week.  Follow up with Dr Caryl Bis about your blood pressure. I refilled lisinopril, spironolactone, and torsemide for 1 month.  Best,  [place] Sinclair, MD  Upper Respiratory Infection, Adult An upper respiratory infection (URI) is also known as the common cold. It is often caused by a type of germ (virus). Colds are easily spread (contagious). You can pass it to others by kissing, coughing, sneezing, or drinking out of the same glass. Usually, you get better in 1 or 2 weeks.  HOME CARE   Only take medicine as told by your doctor.  Use a warm mist humidifier or breathe in steam from a hot shower.  Drink enough water and fluids to keep your pee (urine) clear or pale yellow.  Get plenty of rest.  Return to work when your temperature is back to normal or as told by your doctor. You may use a face mask and wash your hands to stop your cold from spreading. GET HELP RIGHT AWAY IF:   After the first few days, you feel you are getting worse.  You have questions about your medicine.  You have chills, shortness of breath, or brown or red spit (mucus).  You have yellow or brown snot (nasal discharge) or pain in the face, especially when you bend forward.  You have a fever, puffy (swollen) neck, pain when you swallow, or white spots in the back of your throat.  You  have a bad headache, ear pain, sinus pain, or chest pain.  You have a high-pitched whistling sound when you breathe in and out (wheezing).  You have a lasting cough or cough up blood.  You have sore muscles or a stiff neck. MAKE SURE YOU:   Understand these instructions.  Will watch your condition.  Will get help right away if you are not doing well or get worse. Document Released: 03/24/2008 Document Revised: 12/29/2011 Document Reviewed: 01/11/2014 [facility] Patient Information 2015 [place], [place]. This information is not intended to replace advice given to you by your health care provider. Make sure you discuss any questions you have with your health care provider.

## 2014-11-29 NOTE — Progress Notes (Signed)
Patient ID: Hailey Barnes, female   DOB: [birth year], 54 y.o.   MRN: [id number] Subjective:   CC: sore throat  HPI:   Sore throat Patient presents to sameday clinic for sore throat. Recent COPD exacerbation 12/22 for which she took course of doxycycline and prednisone and symptoms improved. Last week, she started to feel hoarse. Four days ago she started having a sore throat. She has been coughing thick beige phlegm for about 1 week. She has also had nasal/sinus drainage and a dry throat.  She denies neck stiffness, rashes, muscle ache.  Takes [employer], and Spiriva daily. Has not required her albuterol. She has been wheezing some and having mild shortness of braeth. She has had some LE edema (symmetric) and thinks weight is up about 4 lbs since cardiology appt with Dr Doylene Canard 1-2 weeks ago. Takes torsemide 127m every other day.  Normally lays on 1 pillow, now 2-3 pillows over 1 week. Some chest pain the past week that is mild and is not present currently. Mild tightness. Mild nausea with chest pain but no diaphoresis or dizziness at that time. Haivng some PND. Abdomen also feels tight. No fevers No sick contacts Still smoking 5 cigarettes daily. Got flu shot this year.  Vaginal discharge She has also had weeks of vaginal discharge and itching since taking doxycycline 12/22. Does not report fevers, chills, or dyspareunia.  Review of Systems - Per HPI.   PMH - dCHF, allergic rhinitis, COPD, eczema, HTN, obesity, restrictive lung dz, tobacco dependence  Smoking status: Current every day smoker    Objective:  Physical Exam BP 111/74 mmHg  Pulse 84  Temp(Src) 98.2 F (36.8 C) (Oral)  Ht 5' 4"  (1.626 m)  Wt 284 lb 4 oz (128.935 kg)  BMI 48.77 kg/m2  LMP 11/16/2014 GEN: NAD CV: RRR, no m/r/g PULM: CTAB, normal effort, occasional dry cough HEENT: AT/Henriette, sclera clear, TMs mildly retracted with fluid bilatearlly, no erythema or purulence, neck supple, nares patent  but swollen, o/p clear but erythematous with no exudate EXTR: 1+ B LE edema, symmetric, to mid-shin (bseline) SKIN: No rash or cyanosis    Assessment:     Hailey WAITMANis a 54y.o. female here for dyspnea, cough, sneezing.    Plan:     # See problem list and after visit summary for problem-specific plans. - refilled lisinopril and spironolactone x 1 month per pt request but urged she f/u within the month with PCP.    # Health Maintenance: Pt has had flu shot.  Follow-up: Follow up in 1 week for f/u of dyspnea.   [place] Sinclair MD [place]

## 2014-11-29 NOTE — Assessment & Plan Note (Signed)
See A/P for diastolic CHF.

## 2014-11-29 NOTE — Assessment & Plan Note (Signed)
Recent antibiotic use, itching and discharge since then. No reported fevers or chills. Likely yeast vaginitis. - Diflucan 140m PO x 1 rx'ed - F/u 1 week if not improving.

## 2014-11-29 NOTE — Assessment & Plan Note (Signed)
Mild fluid overload symptoms with orthopnea, LE edema, mild weight gain 4 lbs, and mild dyspnea. No unilateral leg swelling. Lungs clear on exam and breathing easily without need of albuterol recently. Also with sympoms of viral sinusitis.  - Refilled torsemide and urged pt to take daily at least for this week to help with fluid retention.  - Albuterol q4 hours PRN. No doxycycline or antibiotics as this does not sound like COPD exacerbation. Continue spiriva and dulera. - Continue working on smoking cessation. Quit line provided. - f/u 1 week - rest, hydrate, conservative measures: warm liquids, honey, losenges, salt water gargles

## 2014-11-30 ENCOUNTER — Other Ambulatory Visit: Payer: Self-pay | Admitting: Family Medicine

## 2014-12-05 ENCOUNTER — Encounter: Payer: Self-pay | Admitting: Family Medicine

## 2014-12-05 ENCOUNTER — Ambulatory Visit (INDEPENDENT_AMBULATORY_CARE_PROVIDER_SITE_OTHER): Payer: Medicaid Other | Admitting: Family Medicine

## 2014-12-05 VITALS — BP 121/84 | HR 90 | Temp 98.0°F | Ht 64.0 in | Wt 284.2 lb

## 2014-12-05 DIAGNOSIS — N939 Abnormal uterine and vaginal bleeding, unspecified: Secondary | ICD-10-CM

## 2014-12-05 DIAGNOSIS — I5032 Chronic diastolic (congestive) heart failure: Secondary | ICD-10-CM

## 2014-12-05 DIAGNOSIS — J441 Chronic obstructive pulmonary disease with (acute) exacerbation: Secondary | ICD-10-CM

## 2014-12-05 DIAGNOSIS — L309 Dermatitis, unspecified: Secondary | ICD-10-CM

## 2014-12-05 DIAGNOSIS — I10 Essential (primary) hypertension: Secondary | ICD-10-CM

## 2014-12-05 LAB — POCT HEMOGLOBIN: Hemoglobin: 15.2 g/dL (ref 12.2–16.2)

## 2014-12-05 LAB — POCT URINE PREGNANCY: Preg Test, Ur: NEGATIVE

## 2014-12-05 MED ORDER — TORSEMIDE 100 MG PO TABS: 100.0000 mg | ORAL_TABLET | Freq: Every day | ORAL | Status: DC

## 2014-12-05 MED ORDER — CARVEDILOL 25 MG PO TABS: 25.0000 mg | ORAL_TABLET | Freq: Two times a day (BID) | ORAL | Status: DC

## 2014-12-05 MED ORDER — HYDROCORTISONE 1 % EX OINT: 1.0000 "application " | TOPICAL_OINTMENT | Freq: Two times a day (BID) | CUTANEOUS | Status: DC

## 2014-12-05 MED ORDER — SPIRONOLACTONE 25 MG PO TABS: 25.0000 mg | ORAL_TABLET | Freq: Every day | ORAL | Status: DC

## 2014-12-05 MED ORDER — PREDNISONE 50 MG PO TABS: 50.0000 mg | ORAL_TABLET | Freq: Every day | ORAL | Status: DC

## 2014-12-05 MED ORDER — LISINOPRIL 10 MG PO TABS
10.0000 mg | ORAL_TABLET | Freq: Every day | ORAL | Status: DC
Start: 2014-12-05 — End: 2015-02-06

## 2014-12-05 MED ORDER — TRIAMCINOLONE ACETONIDE 0.1 % EX CREA: 1.0000 "application " | TOPICAL_CREAM | Freq: Two times a day (BID) | CUTANEOUS | Status: DC

## 2014-12-05 NOTE — Patient Instructions (Addendum)
Nice to see you. We need to have you come back for a pelvic exam. You need to take your torsemide daily. If you notice shortness of breath, increased swelling, or wake up with shortness of breath please let us know.  If your bleeding does not improve this week or you develop light headedness, chest pain, or dyspnea please let us know.  We will treat you with a course of prednisone for your productive cough. If you develop fever, shortness of breath, more productive cough please seek medical attention.  Please use the hydrocortisone cream on your eye lids. Do not get this in your eyes. You can use the triamcinolone elsewhere.

## 2014-12-06 ENCOUNTER — Ambulatory Visit
Admission: RE | Admit: 2014-12-06 | Discharge: 2014-12-06 | Disposition: A | Discharge status: Home / Self Care | Payer: Medicaid Other | Source: Ambulatory Visit | Attending: Family Medicine | Admitting: Family Medicine

## 2014-12-06 ENCOUNTER — Ambulatory Visit: Payer: Medicaid Other

## 2014-12-06 ENCOUNTER — Telehealth: Payer: Self-pay | Admitting: Family Medicine

## 2014-12-06 DIAGNOSIS — J441 Chronic obstructive pulmonary disease with (acute) exacerbation: Secondary | ICD-10-CM

## 2014-12-06 NOTE — Telephone Encounter (Signed)
Patient informed of no acute process on CXR.

## 2014-12-07 DIAGNOSIS — N939 Abnormal uterine and vaginal bleeding, unspecified: Secondary | ICD-10-CM | POA: Insufficient documentation

## 2014-12-07 NOTE — Assessment & Plan Note (Addendum)
Patient with increased sputum production in setting of COPD. Likely exacerbation of COPD. Mild in nature with normal O2 sat and no shortness of breath. Given mild nature will treat with short burst of steroids. CXR with no acute processes to indicate PNA. If does not improve with steroids consider addition of doxycycline. Advised on use of albuterol. Continue dulera and spiriva. Given return precautions.

## 2014-12-07 NOTE — Assessment & Plan Note (Signed)
Appears stable. Discussed that she should not use the triamcinolone on her eye lids. Will give 1% hydrocortisone for eyelids to be used sparingly. Triamcinolone refilled for use on other areas of rash.

## 2014-12-07 NOTE — Assessment & Plan Note (Addendum)
Patient with onset of abnormal periods over the past several months. Given her age and report of hot flashes this likely represents a perimenopausal state. Patient declined pelvic exam today and states she will return to have this done. Hgb stable. Pregnancy test negative. Discussion was had with patient regarding potential for abnormal process vs perimenopause and what potential further work up would be including endometrial biopsy and [place]. A joint decision with the patient and myself was made to perform a pelvic exam at the patients convenience some time in the next week and to monitor for further bleeding. If patient has persistent abnormal bleeding or intermenstrual bleeding episodes we will need to move forward with further work up of this issue. She voiced understanding. Given return precautions.   Discussed with Dr Ree Kida

## 2014-12-07 NOTE — Assessment & Plan Note (Signed)
Patient reports feeling well with regards to this issue, though weight is up from baseline and has increasing bilateral LE edema. O2 sat normal at 95% and lung clear so doubt pulmonary edema. Advised to take torsemide daily. Refilled lisinopril, spironolactone, and coreg. Will follow-up next week to check on weight and breathing status.

## 2014-12-07 NOTE — Progress Notes (Signed)
Patient ID: Hailey Barnes, female   DOB: 1961-07-01, 54 y.o.   MRN: [id number]  Hailey Rumps, MD Phone: [phone]  Hailey Barnes is a 54 y.o. female who presents today for f/u.  Diastolic CHF: notes no complaints with regard to this. No dyspnea. Minimal swelling. PND has been improved from previously. Is sleeping on 3 pillows and this has not changed. Notes some weight gain. Has not been taking torsemide as she is supposed to. Only took this 3 times in the past week. Continues on coreg, lisinopril, and spironolactone. States saw Dr Daine Floras 2 weeks ago and he told her to lose 20 lbs in the next 3 months.   Eczema: notes this is well controlled by triamcinolone, though needs larger supply of this. She has rash mostly on chest, elbows and around her eyes. States she has been using the triamcinolone around her eye lids.   COPD exacerbation: notes increased cough with increased sputum over the past 3 weeks. Notes hoarseness and sore throat. Some post nasal drip. No congestion or ear issues. No fever. Has not been using albuterol. Notes some wheezing with this. No dyspnea. Still smoking.   Abnormal uterine bleeding: notes typically has period every month for 7 days. Had period in October 2015 for 3 weeks. No period in November or December. Light bleeding for 1-2 days in January 2016. Period started this past Sunday and is heavier than usual. Has gone through 6-7 pads per day. Some clots. Had history of fibroids, though reports these were removed. Notes some cramping discomfort is typical with her periods. Notes has had hot flashes. No light headedness.  Patient is a smoker.   ROS: Per HPI   Physical Exam Filed Vitals:   12/05/14 1611  BP: 121/84  Pulse: 90  Temp: 98 F (36.7 C)    Gen: Well NAD HEENT: PERRL,  MMM, normal TM bilaterally, normal OP, no cervical LAD Lungs: CTABL Nl WOB Heart: RRR  Exts: trace BL  LE, warm and well perfused.   GU: patient refused  pelvic exam   Assessment/Plan: Please see individual problem list.  Hailey Rumps, MD [place] PGY-3

## 2014-12-08 NOTE — Progress Notes (Signed)
I agree with the resident documentation and plan.   Dossie Arbour MD

## 2014-12-12 ENCOUNTER — Ambulatory Visit: Payer: Medicaid Other

## 2014-12-14 ENCOUNTER — Ambulatory Visit: Payer: Medicaid Other | Admitting: Family Medicine

## 2015-02-06 ENCOUNTER — Telehealth: Payer: Self-pay | Admitting: Family Medicine

## 2015-02-06 DIAGNOSIS — I10 Essential (primary) hypertension: Secondary | ICD-10-CM

## 2015-02-06 MED ORDER — TORSEMIDE 100 MG PO TABS: 100.0000 mg | ORAL_TABLET | Freq: Every day | ORAL | Status: DC

## 2015-02-06 MED ORDER — LISINOPRIL 10 MG PO TABS: 10.0000 mg | ORAL_TABLET | Freq: Every day | ORAL | Status: DC

## 2015-02-06 NOTE — Telephone Encounter (Signed)
I am covering for Dr. Caryl Bis who is away from the office.  Rx sent in for torsemide to [facility]. I will defer decisions about changing medication to Dr. Caryl Bis when he returns from vacation. Please inform patient.  Leeanne Rio, MD

## 2015-02-06 NOTE — Telephone Encounter (Signed)
Will forward to MD to refill medication and also to see if there is a cheaper one for her to take. Jazmin Hartsell,CMA

## 2015-02-06 NOTE — Telephone Encounter (Signed)
Paged to [facility] Emergency Line by patient, Hailey Barnes at approx [phone]. She reported that she was waiting on a fluid pill prescription (Torsemide 160m daily) to be sent a pharmacy that [facility] could afford[facility]. She states that she previously had Medicaid insurance but this was dropped in February 2016 and now she has limited resources, [facility] and no longer following Card[facility]. Reported prior diagnosis CHF, COPD (no prior h/o home supplemental O2). Currently complaining of recent 2-3 day worsening SOB and lower ext edema, has been out of Torsemide x 2 days. [facility]  I advised her that it appears that [facility], however this would not be ready to pick-up until tomorrow 02/07/15. Regarding her symptoms, I advised her that if she continued worsening or developed new or worsening symptoms, she may come directly to ED for evaluation, otherwise if stable and decides to wait until tries medicine tomorrow, she could [facility]up Torsemide [facility]  ANobie Putnam [place] PGY-2

## 2015-02-06 NOTE — Telephone Encounter (Signed)
Pt called because she needs a refill on her fluid pills. The other issue is she needs to talk to a nurse about changing the pharmacy or the pills so that she can afford them. jw

## 2015-02-07 MED ORDER — FUROSEMIDE 80 MG PO TABS: 80.0000 mg | ORAL_TABLET | Freq: Two times a day (BID) | ORAL | Status: DC

## 2015-02-07 NOTE — Telephone Encounter (Signed)
Received a fax from [facility] stating they don't carry Demadex.  They can provide furosemide for $6 with orange card.  If consider changing please send in new Rx.  Derl Barrow, RN

## 2015-02-07 NOTE — Telephone Encounter (Signed)
Called patient. She endorses some SOB and swelling as described below, due to being out of her torsemide. She says she does not feel bad enough to go to ER and be seen, and has no way of getting to clinic today.   She has no insurance coverage after Medicaid was d/c'd back in February. Does not have the orange card.  Recommend that I prescribe lasix for her, which is on the 4 dollar list. She states she will be able to afford this medicine. Recommended she go to ER if any worsening. Should come in for appt this week if not getting better with lasix. Otherwise needs to follow up with Dr. Caryl Bis next week in clinic.  Leeanne Rio, MD

## 2015-02-07 NOTE — Addendum Note (Signed)
Addended by: Leeanne Rio on: 02/07/2015 12:34 PM   Modules accepted: Orders

## 2015-02-07 NOTE — Telephone Encounter (Signed)
Juliann Pulse from [facility] called to get an order to change Demadex to furosemide.  Dr. Ardelia Mems sent furosemide (LASIX) 80 MG tablet: Take 1 tablet (80 mg total) by mouth 2 (two) times daily to [facility].  Verbal order given to [facility] for furosemide because patient was at the [facility] to pick up the medication.  Will send to PCP.  Derl Barrow, RN

## 2015-02-14 NOTE — Telephone Encounter (Signed)
Noted. Agree with verbal order.

## 2015-03-07 ENCOUNTER — Other Ambulatory Visit: Payer: Self-pay | Admitting: *Deleted

## 2015-03-07 NOTE — Telephone Encounter (Signed)
Left voice message for [facility] pharmacist that pt need a follow up appt to discuss continuation of medication.  Derl Barrow, RN

## 2015-03-07 NOTE — Telephone Encounter (Signed)
Patient needs to follow-up to discuss continuing this medication. It appears this was not been filled in 6 months. Please inform patient. Thanks.

## 2015-03-07 NOTE — Telephone Encounter (Signed)
Should patient be taking Chantix per [facility]?  Derl Barrow, RN

## 2015-04-16 ENCOUNTER — Other Ambulatory Visit: Payer: Self-pay | Admitting: *Deleted

## 2015-04-17 ENCOUNTER — Ambulatory Visit (INDEPENDENT_AMBULATORY_CARE_PROVIDER_SITE_OTHER): Payer: Self-pay | Admitting: Family Medicine

## 2015-04-17 ENCOUNTER — Encounter: Payer: Self-pay | Admitting: Family Medicine

## 2015-04-17 VITALS — BP 146/100 | HR 97 | Temp 98.1°F | Ht 64.0 in | Wt 285.0 lb

## 2015-04-17 DIAGNOSIS — M5441 Lumbago with sciatica, right side: Secondary | ICD-10-CM

## 2015-04-17 DIAGNOSIS — I10 Essential (primary) hypertension: Secondary | ICD-10-CM

## 2015-04-17 DIAGNOSIS — M545 Low back pain, unspecified: Secondary | ICD-10-CM

## 2015-04-17 DIAGNOSIS — N939 Abnormal uterine and vaginal bleeding, unspecified: Secondary | ICD-10-CM

## 2015-04-17 DIAGNOSIS — F329 Major depressive disorder, single episode, unspecified: Secondary | ICD-10-CM

## 2015-04-17 DIAGNOSIS — N841 Polyp of cervix uteri: Secondary | ICD-10-CM

## 2015-04-17 DIAGNOSIS — F32A Depression, unspecified: Secondary | ICD-10-CM

## 2015-04-17 LAB — CBC
HCT: 48.9 % — ABNORMAL HIGH (ref 36.0–46.0)
HEMOGLOBIN: 16.4 g/dL — AB (ref 12.0–15.0)
MCH: 27.6 pg (ref 26.0–34.0)
MCHC: 33.5 g/dL (ref 30.0–36.0)
MCV: 82.3 fL (ref 78.0–100.0)
MPV: 10.9 fL (ref 8.6–12.4)
Platelets: 221 10*3/uL (ref 150–400)
RBC: 5.94 MIL/uL — ABNORMAL HIGH (ref 3.87–5.11)
RDW: 14.5 % (ref 11.5–15.5)
WBC: 11.3 10*3/uL — ABNORMAL HIGH (ref 4.0–10.5)

## 2015-04-17 LAB — BASIC METABOLIC PANEL
BUN: 11 mg/dL (ref 6–23)
CHLORIDE: 103 meq/L (ref 96–112)
CO2: 23 meq/L (ref 19–32)
Calcium: 9.5 mg/dL (ref 8.4–10.5)
Creat: 1.08 mg/dL (ref 0.50–1.10)
Glucose, Bld: 91 mg/dL (ref 70–99)
Potassium: 3.9 mEq/L (ref 3.5–5.3)
Sodium: 138 mEq/L (ref 135–145)

## 2015-04-17 MED ORDER — SERTRALINE HCL 50 MG PO TABS: 50.0000 mg | ORAL_TABLET | Freq: Every day | ORAL | Status: DC

## 2015-04-17 MED ORDER — LISINOPRIL 10 MG PO TABS
10.0000 mg | ORAL_TABLET | Freq: Every day | ORAL | Status: DC
Start: 2015-04-17 — End: 2015-08-30

## 2015-04-17 MED ORDER — FUROSEMIDE 80 MG PO TABS: 80.0000 mg | ORAL_TABLET | Freq: Two times a day (BID) | ORAL | Status: DC

## 2015-04-17 MED ORDER — SPIRONOLACTONE 25 MG PO TABS
25.0000 mg | ORAL_TABLET | Freq: Every day | ORAL | Status: DC
Start: 2015-04-17 — End: 2015-07-20

## 2015-04-17 NOTE — Patient Instructions (Signed)
Nice to see you. We will refill your medications. You will come back to [facility] clinic to have your polyp removed. We will inform you of your lab results. We will start you on medication for depression as well.  If you develop thoughts of hurting yourself or others, weakness, numbness, loss of bowel or bladder function, fever, increased bleeding, or light headedness please seek medical attention.

## 2015-04-18 ENCOUNTER — Telehealth: Payer: Self-pay | Admitting: Family Medicine

## 2015-04-18 DIAGNOSIS — D72829 Elevated white blood cell count, unspecified: Secondary | ICD-10-CM

## 2015-04-18 NOTE — Telephone Encounter (Signed)
Called patient to inform of lab results. Discussed that WBC is elevated mildly as is her hemoglobin. She reports she feels well over all other than her back pain. She denies fevers, cough, dysuria, vomiting, diarrhea, vaginal discharge, chills, chest pain, and trouble breathing. Denies rash. Notes mild achy discomfort in lower abdomen that is intermittent. Notes she is intermittently sexually active. Notes an odor from her vagina, though no discharge. Will have the patient come in tomorrow for office visit to evaluate further. She does not endorse any systemic symptoms to indicate evaluation in ED at this time. I discussed reasons to seek medical attention with the patient.

## 2015-04-19 ENCOUNTER — Other Ambulatory Visit: Payer: Self-pay

## 2015-04-19 ENCOUNTER — Encounter: Payer: Self-pay | Admitting: Family Medicine

## 2015-04-19 ENCOUNTER — Other Ambulatory Visit (HOSPITAL_COMMUNITY)
Admission: RE | Admit: 2015-04-19 | Discharge: 2015-04-19 | Disposition: A | Discharge status: Home / Self Care | Payer: Self-pay | Source: Ambulatory Visit | Attending: Family Medicine | Admitting: Family Medicine

## 2015-04-19 ENCOUNTER — Ambulatory Visit (INDEPENDENT_AMBULATORY_CARE_PROVIDER_SITE_OTHER): Payer: Self-pay | Admitting: Family Medicine

## 2015-04-19 ENCOUNTER — Telehealth: Payer: Self-pay | Admitting: Family Medicine

## 2015-04-19 VITALS — BP 148/98 | HR 90 | Temp 98.0°F | Ht 64.0 in | Wt 287.4 lb

## 2015-04-19 DIAGNOSIS — F32A Depression, unspecified: Secondary | ICD-10-CM | POA: Insufficient documentation

## 2015-04-19 DIAGNOSIS — Z113 Encounter for screening for infections with a predominantly sexual mode of transmission: Secondary | ICD-10-CM | POA: Insufficient documentation

## 2015-04-19 DIAGNOSIS — F329 Major depressive disorder, single episode, unspecified: Secondary | ICD-10-CM | POA: Insufficient documentation

## 2015-04-19 DIAGNOSIS — N898 Other specified noninflammatory disorders of vagina: Secondary | ICD-10-CM

## 2015-04-19 DIAGNOSIS — G8929 Other chronic pain: Secondary | ICD-10-CM | POA: Insufficient documentation

## 2015-04-19 DIAGNOSIS — R1031 Right lower quadrant pain: Secondary | ICD-10-CM

## 2015-04-19 LAB — POCT WET PREP (WET MOUNT): CLUE CELLS WET PREP WHIFF POC: POSITIVE

## 2015-04-19 LAB — POCT URINALYSIS DIPSTICK
Bilirubin, UA: NEGATIVE
GLUCOSE UA: NEGATIVE
Ketones, UA: NEGATIVE
Nitrite, UA: NEGATIVE
PH UA: 5.5
PROTEIN UA: NEGATIVE
Spec Grav, UA: 1.01
Urobilinogen, UA: 0.2

## 2015-04-19 LAB — POCT URINE PREGNANCY: PREG TEST UR: NEGATIVE

## 2015-04-19 LAB — POCT UA - MICROSCOPIC ONLY

## 2015-04-19 MED ORDER — AZITHROMYCIN 250 MG PO TABS
500.0000 mg | ORAL_TABLET | Freq: Once | ORAL | Status: AC
  Administered 2015-04-19: 500 mg via ORAL

## 2015-04-19 MED ORDER — CEFTRIAXONE SODIUM 1 G IJ SOLR
250.0000 mg | Freq: Once | INTRAMUSCULAR | Status: AC
  Administered 2015-04-19: 250 mg via INTRAMUSCULAR

## 2015-04-19 MED ORDER — METRONIDAZOLE 500 MG PO TABS: 500.0000 mg | ORAL_TABLET | Freq: Two times a day (BID) | ORAL | Status: DC

## 2015-04-19 NOTE — Assessment & Plan Note (Signed)
Recent onset depression. Notes some SI, though no intent or plan to harm herself. No HI. Will start on zoloft. Discussed that if she develops a plan to hurt herself or others she will need to seek medical attention immediately.

## 2015-04-19 NOTE — Telephone Encounter (Signed)
Attempted to call patient to discuss wet prep results. Went straight to VM. Left message asking her to call back to the office. When she calls back please inform her that she has BV. This is not an STD. We will treat this with flagyl which was sent to the pharmacy.

## 2015-04-19 NOTE — Assessment & Plan Note (Signed)
Patient with several weeks of RLQ discomfort following unprotected sexual intercourse. Had cervical motion tenderness on exam which brings PID in to the possible diagnoses, particularly with mild WBC elevation at last visit. Abdominal exam is benign and there are no peritoneal signs making appendicitis less likely. Pregnancy test negative. Has had some diarrhea this morning so could be GI related. Doubt UTI given lack of symptoms, though does have positive leuks on UA. Wet prep also with BV which could lead to some discomfort. Doubt skin infection given lack of skin findings on exam. Overall patient is well appearing with stable vital signs and no signs of systemic illness. Will send urine culture. GC/chlamydia collected. Patient treated with ceftriaxone and azithro given cervical motion tenderness. Treat BV with flagyl. Patient was to have repeat CBC drawn today as a future order that was previously placed though patient left prior to this being collected. Will have nursing call patient to have her come in for this to be drawn. Will monitor for further symptoms. If not improving would consider pelvic US to evaluate further. Given return precautions. Patient to follow-up next week.

## 2015-04-19 NOTE — Assessment & Plan Note (Signed)
Continued abnormal uterine bleeding likely related to cervical polyp vs perimenopause. Will have patient return to [facility] for removal of polyp. CBC checked today. Given return precautions.

## 2015-04-19 NOTE — Assessment & Plan Note (Addendum)
Continued back pain now with radiation of pain to right hip and thigh. Neurologically intact at this time. No red flags. Discussed options for treatment and patient opted for referral to ortho for further evaluation. Given return precautions.

## 2015-04-19 NOTE — Assessment & Plan Note (Signed)
Not at goal though has not taken medications today. No HA or vision changes. Will refill medications. Check BMET today. F/u in 2 days for BP recheck.

## 2015-04-19 NOTE — Patient Instructions (Signed)
Nice to see you. We are going to treat you for an STD. We will also culture your urine to determine if there is an infection.  If you develop fever, worsening pain, nausea, vomiting, diarrhea, light headedness, or feel unwell please seek medical attention.

## 2015-04-19 NOTE — Progress Notes (Signed)
Patient ID: Hailey Barnes, female   DOB: April 17, 1961, 54 y.o.   MRN: [id number]  Hailey Rumps, MD Phone: [phone]  Hailey Barnes is a 54 y.o. female who presents today for f/u.  Lower abdominal discomfort: notes 2 weeks of discomfort in lower abdomen on right side that is a dull ache. Notes there is an odor to her vagina that is different than previously. She denies discharge. Denies dysuria, fever, chills, and itching. Notes no pain previously. Notes some diarrhea this morning, though none prior and denies nausea and vomiting. Notes typically has 2-3 normal BMs daily. No urinary frequency. When asked where her discomfort is she pulls up her panus and points to the fat and not to her abdominal wall. She denies rash or swelling in this area. She endorses sexual activity for the first time in a long time 2 weeks ago and no condoms. Has history of STD when younger.   Reports she just started back on her BP meds yesterday. No CP or SOB.   PMH: nonsmoker.   ROS: Per HPI   Physical Exam Filed Vitals:   04/19/15 1558  BP: 148/98  Pulse:   Temp:     Gen: Well NAD Lungs: CTABL Nl WOB Heart: RRR, no murmur appreciated Abd: soft, NT, ND, patient pulled up panus and there was no apparent erythema, induration, or swelling, there was no tenderness in the area of discomfort, no guarding or rebound GU: normal labia, normal vaginal mucosa, small amount of blood, no discharge appreciated, cervical os with polyp noted, patient with cervical motion tenderness on exam, no adnexal tenderness or mass on bimanual exam Exts: Non edematous BL  LE, warm and well perfused.    Assessment/Plan: Please see individual problem list.  RLQ abdominal pain Patient with several weeks of RLQ discomfort following unprotected sexual intercourse. Had cervical motion tenderness on exam which brings PID in to the possible diagnoses, particularly with mild WBC elevation at last visit. Abdominal exam is benign and there  are no peritoneal signs making appendicitis less likely. Pregnancy test negative. Has had some diarrhea this morning so could be GI related. Doubt UTI given lack of symptoms, though does have positive leuks on UA. Wet prep also with BV which could lead to some discomfort. Doubt skin infection given lack of skin findings on exam. Overall patient is well appearing with stable vital signs and no signs of systemic illness. Will send urine culture. GC/chlamydia collected. Patient treated with ceftriaxone and azithro given cervical motion tenderness. Treat BV with flagyl. Patient was to have repeat CBC drawn today as a future order that was previously placed though patient left prior to this being collected. Will have nursing call patient to have her come in for this to be drawn. Will monitor for further symptoms. If not improving would consider pelvic US to evaluate further. Given return precautions. Patient to follow-up next week.     Orders Placed This Encounter  Procedures  . Urine culture  . Urinalysis Dipstick  . POCT urine pregnancy  . POCT UA - Microscopic Only  . POCT Wet Prep [facility])    Meds ordered this encounter  Medications  . cefTRIAXone (ROCEPHIN) injection 250 mg    Sig:     Order Specific Question:  Antibiotic Indication:    Answer:  STD  . azithromycin (ZITHROMAX) tablet 500 mg    Sig:     Hailey Rumps, MD [place] PGY-3

## 2015-04-19 NOTE — Progress Notes (Signed)
Patient ID: Hailey Barnes, female   DOB: 1961/03/26, 54 y.o.   MRN: [id number]  Tommi Rumps, MD Phone: [phone]  Hailey Barnes is a 54 y.o. female who presents today for f/u.  Depression: Patient states she has become tired of being sick. She notes that she is unable to do much due to her various illnesses. Notes she can't do much due to back pain. She notes thoughts that she would be better off not being around, though denies intent and plan to hurt her self. No HI. No prior history of depression.   HYPERTENSION Disease Monitoring Home BP Monitoring not checking Chest pain- no    Dyspnea- no Medications Compliance-  Has been out of medication for 3-4 days,. Lightheadedness-  no  Edema- no  Back pain: continues to have issues with this. Has worsened in her low back. Now has radiation of pain to her lateral thigh. Is stabbing pain. Notes worsened with standing up and moving around. Is better in the morning with rest. Denies fever, weakness, saddle anesthesia, incontinence, and history of cancer.   Abnormal Uterine bleeding: has been occurring intermittently since she last saw me. Was supposed to return for pelvic exam though did not. Notes continued intermittent bleeding. No abdominal pain. Notes some cramping with this. Some hot flashes. No light headedness. Notes blood on tissue when wipes vagina. No dysuria. No blood in toilet. No frequency. No hematuria.  PMH: smoker.   ROS: Per HPI   Physical Exam Filed Vitals:   04/17/15 1640  BP: 146/100  Pulse:   Temp:     Gen: Well NAD HEENT: PERRL,  MMM Lungs: CTABL Nl WOB Heart: RRR, no murmur appreciated Abd: soft, NT, ND GU: normal labia, normal vaginal mucosa, small amount of red blood noted in the vagina, polyp noted in cervical os, no cervical motion tenderness, no masses palpated on bimanual exam MSK: no midline spine tenderness or step off, right low back tenderness in paraspinous muscles and lumbar musculature, no  swelling Neuro: 5/5 strength in bilateral quads, hamstrings, plantar and dorsiflexion, sensation to light touch intact in bilateral LE, normal gait, 2+ patellar reflexes Exts: Non edematous BL  LE, warm and well perfused.    Assessment/Plan: Please see individual problem list.  Abnormal uterine bleeding Continued abnormal uterine bleeding likely related to cervical polyp vs perimenopause. Will have patient return to [facility] clinic for removal of polyp. CBC checked today. Given return precautions.   Hypertension Not at goal though has not taken medications today. No HA or vision changes. Will refill medications. Check BMET today. F/u in 2 days for BP recheck.   Depression Recent onset depression. Notes some SI, though no intent or plan to harm herself. No HI. Will start on zoloft. Discussed that if she develops a plan to hurt herself or others she will need to seek medical attention immediately.   Back pain Continued back pain now with radiation of pain to right hip and thigh. Neurologically intact at this time. No red flags. Discussed options for treatment and patient opted for referral to ortho for further evaluation. Given return precautions.     Orders Placed This Encounter  Procedures  . Basic Metabolic Panel  . CBC  . Ambulatory referral to Orthopedic Surgery    Referral Priority:  Routine    Referral Type:  Surgical    Referral Reason:  Specialty Services Required    Requested Specialty:  Orthopedic Surgery    Number of Visits Requested:  1  Meds ordered this encounter  Medications  . lisinopril (PRINIVIL,ZESTRIL) 10 MG tablet    Sig: Take 1 tablet (10 mg total) by mouth daily.    Dispense:  30 tablet    Refill:  2  . spironolactone (ALDACTONE) 25 MG tablet    Sig: Take 1 tablet (25 mg total) by mouth daily.    Dispense:  30 tablet    Refill:  0  . furosemide (LASIX) 80 MG tablet    Sig: Take 1 tablet (80 mg total) by mouth 2 (two) times daily.    Dispense:  60  tablet    Refill:  0  . sertraline (ZOLOFT) 50 MG tablet    Sig: Take 1 tablet (50 mg total) by mouth daily.    Dispense:  30 tablet    Refill:  3     Tommi Rumps, MD [place] PGY-3

## 2015-04-20 ENCOUNTER — Other Ambulatory Visit: Payer: Self-pay

## 2015-04-20 LAB — GC/CHLAMYDIA PROBE AMP (~~LOC~~) NOT AT ARMC
Chlamydia: NEGATIVE
Neisseria Gonorrhea: NEGATIVE

## 2015-04-20 NOTE — Telephone Encounter (Signed)
-----   Message from Leone Haven, MD sent at 04/19/2015  6:57 PM EDT ----- Can you call the patient and have her come back in to have her CBC drawn? Thanks.

## 2015-04-20 NOTE — Progress Notes (Signed)
CSW contacted pt regarding health coverage as pt informed PCP that she is on disability but does not have coverage. Pt confirmed the above and stated that because of her income she currently does not qualify for Medicaid and is unsure as to why she does not have Medicare. CSW informed pt that she needs to contact social security administration to confirm when she will be eligible. CSW is under the impression that an individual on disability needs to wait 2 years before qualifying for Medicare. Pt agreeable to contact social security and will contact Eusebio Friendly in our clinic to discuss applying for the orange card.  Hunt Oris, MSW, [place]

## 2015-04-20 NOTE — Telephone Encounter (Signed)
LMTCB, will try again later. Hailey Barnes, CMA.

## 2015-04-20 NOTE — Telephone Encounter (Signed)
LMTCB will try again later. [name], CMA.

## 2015-04-20 NOTE — Telephone Encounter (Signed)
Pt is aware of results.  She is coming today to get CBC. Matthewjames Petrasek,CMA

## 2015-04-21 LAB — URINE CULTURE
Colony Count: NO GROWTH
Organism ID, Bacteria: NO GROWTH

## 2015-04-24 ENCOUNTER — Ambulatory Visit: Payer: Self-pay | Admitting: Family Medicine

## 2015-04-24 DIAGNOSIS — D72829 Elevated white blood cell count, unspecified: Secondary | ICD-10-CM

## 2015-04-24 LAB — CBC WITH DIFFERENTIAL/PLATELET
Basophils Absolute: 0.1 10*3/uL (ref 0.0–0.1)
Basophils Relative: 1 % (ref 0–1)
EOS PCT: 2 % (ref 0–5)
Eosinophils Absolute: 0.2 10*3/uL (ref 0.0–0.7)
HEMATOCRIT: 49.7 % — AB (ref 36.0–46.0)
HEMOGLOBIN: 16.9 g/dL — AB (ref 12.0–15.0)
LYMPHS ABS: 3.3 10*3/uL (ref 0.7–4.0)
LYMPHS PCT: 33 % (ref 12–46)
MCH: 28.3 pg (ref 26.0–34.0)
MCHC: 34 g/dL (ref 30.0–36.0)
MCV: 83.1 fL (ref 78.0–100.0)
MPV: 11 fL (ref 8.6–12.4)
Monocytes Absolute: 1.2 10*3/uL — ABNORMAL HIGH (ref 0.1–1.0)
Monocytes Relative: 12 % (ref 3–12)
NEUTROS ABS: 5.1 10*3/uL (ref 1.7–7.7)
NEUTROS PCT: 52 % (ref 43–77)
Platelets: 228 10*3/uL (ref 150–400)
RBC: 5.98 MIL/uL — ABNORMAL HIGH (ref 3.87–5.11)
RDW: 14.5 % (ref 11.5–15.5)
WBC: 9.9 10*3/uL (ref 4.0–10.5)

## 2015-05-03 ENCOUNTER — Other Ambulatory Visit: Payer: Self-pay | Admitting: *Deleted

## 2015-05-03 ENCOUNTER — Ambulatory Visit (INDEPENDENT_AMBULATORY_CARE_PROVIDER_SITE_OTHER): Payer: Self-pay | Admitting: Family Medicine

## 2015-05-03 ENCOUNTER — Encounter: Payer: Self-pay | Admitting: Family Medicine

## 2015-05-03 VITALS — BP 153/97 | HR 84 | Temp 98.2°F | Ht 64.0 in | Wt 283.0 lb

## 2015-05-03 DIAGNOSIS — N939 Abnormal uterine and vaginal bleeding, unspecified: Secondary | ICD-10-CM

## 2015-05-03 DIAGNOSIS — R102 Pelvic and perineal pain: Secondary | ICD-10-CM

## 2015-05-03 DIAGNOSIS — N841 Polyp of cervix uteri: Secondary | ICD-10-CM

## 2015-05-03 NOTE — Patient Instructions (Addendum)
Hailey Barnes, you had a small cervical polyp that was removed today.  It has been sent off to the lab for evaluation.  We will contact you with these results.  Schedule an appointment with Dr [name] to be seen in 2 weeks.  We will order the CT scan at that time if able.  Pelvic Pain Female pelvic pain can be caused by many different things and start from a variety of places. Pelvic pain refers to pain that is located in the lower half of the abdomen and between your hips. The pain may occur over a short period of time (acute) or may be reoccurring (chronic). The cause of pelvic pain may be related to disorders affecting the female reproductive organs (gynecologic), but it may also be related to the bladder, kidney stones, an intestinal complication, or muscle or skeletal problems. Getting help right away for pelvic pain is important, especially if there has been severe, sharp, or a sudden onset of unusual pain. It is also important to get help right away because some types of pelvic pain can be life threatening.  CAUSES  Below are only some of the causes of pelvic pain. The causes of pelvic pain can be in one of several categories.   Gynecologic.  Pelvic inflammatory disease.  Sexually transmitted infection.  Ovarian cyst or a twisted ovarian ligament (ovarian torsion).  Uterine lining that grows outside the uterus (endometriosis).  Fibroids, cysts, or tumors.  Ovulation.  Pregnancy.  Pregnancy that occurs outside the uterus (ectopic pregnancy).  Miscarriage.  Labor.  Abruption of the placenta or ruptured uterus.  Infection.  Uterine infection (endometritis).  Bladder infection.  Diverticulitis.  Miscarriage related to a uterine infection (septic abortion).  Bladder.  Inflammation of the bladder (cystitis).  Kidney stone(s).  Gastrointestinal.  Constipation.  Diverticulitis.  Neurologic.  Trauma.  Feeling pelvic pain because of mental or emotional causes  (psychosomatic).  Cancers of the bowel or pelvis. EVALUATION  Your caregiver will want to take a careful history of your concerns. This includes recent changes in your health, a careful gynecologic history of your periods (menses), and a sexual history. Obtaining your family history and medical history is also important. Your caregiver may suggest a pelvic exam. A pelvic exam will help identify the location and severity of the pain. It also helps in the evaluation of which organ system may be involved. In order to identify the cause of the pelvic pain and be properly treated, your caregiver may order tests. These tests may include:   A pregnancy test.  Pelvic ultrasonography.  An X-ray exam of the abdomen.  A urinalysis or evaluation of vaginal discharge.  Blood tests. HOME CARE INSTRUCTIONS   Only take over-the-counter or prescription medicines for pain, discomfort, or fever as directed by your caregiver.   Rest as directed by your caregiver.   Eat a balanced diet.   Drink enough fluids to make your urine clear or pale yellow, or as directed.   Avoid sexual intercourse if it causes pain.   Apply warm or cold compresses to the lower abdomen depending on which one helps the pain.   Avoid stressful situations.   Keep a journal of your pelvic pain. Write down when it started, where the pain is located, and if there are things that seem to be associated with the pain, such as food or your menstrual cycle.  Follow up with your caregiver as directed.  SEEK MEDICAL CARE IF:  Your medicine does not help your  pain.  You have abnormal vaginal discharge. SEEK IMMEDIATE MEDICAL CARE IF:   You have heavy bleeding from the vagina.   Your pelvic pain increases.   You feel light-headed or faint.   You have chills.   You have pain with urination or blood in your urine.   You have uncontrolled diarrhea or vomiting.   You have a fever or persistent symptoms for more  than 3 days.  You have a fever and your symptoms suddenly get worse.   You are being physically or sexually abused.  MAKE SURE YOU:  Understand these instructions.  Will watch your condition.  Will get help if you are not doing well or get worse. Document Released: 09/02/2004 Document Revised: 02/20/2014 Document Reviewed: 01/26/2012 [facility] Patient Information 2015 [place], [place]. This information is not intended to replace advice given to you by your health care provider. Make sure you discuss any questions you have with your health care provider.

## 2015-05-04 MED ORDER — ALBUTEROL SULFATE HFA 108 (90 BASE) MCG/ACT IN AERS: 2.0000 | INHALATION_SPRAY | Freq: Two times a day (BID) | RESPIRATORY_TRACT | Status: DC

## 2015-05-04 NOTE — Progress Notes (Signed)
   Subjective:    Patient ID: Hailey Barnes, female    DOB: 1961/01/08, 54 y.o.   MRN: [id number]  HPI Here for follow-up of apparent cervical polyp noted on last GYN exam. She has been having some intermittent spotting. She is also continue to have right lower quadrant pain despite her treatment at last office visit with and biotics.  Right lower quadrant pain is aching in nature, present all the time. Maybe a little worse with bowel movement but she's not totally sure of that. She's had no blood in stool, no constipation, no diarrhea, no change in caliber of stool.   Review of Systems Denies fevers, sweats, chills, unusual weight change. See history of present illness.    Objective:   Physical Exam Vital signs are reviewed GEN.: Well-developed obese female no acute distress GU: Externally normal. No adnexal masses are noted although this exam is extremely limited by habitus. Speculum exam was quite uncomfortable for her. It increased her right lower quadrant pain, particularly when we manipulated the cervix. Cervix revealed a very small friable polyp at 8:00.  PROCEDURE NOTE: Patient was given informed consent for polypectomy from the cervix. Signed copies in the chart. Appropriate time out was taken. The small amount of apparent polyp tissue that was noted on speculum exam was grasped with ring forceps. The tissue was very friable and became quite macerated. I was able to get a very small piece into a sample jar which we will send for pathology. Cervix had a somewhat firm consistency to it and manipulation seem to reproduce some of her right lower quadrant pain.       Assessment & Plan:  #1. Right lower quadrant pain and pelvic pain today on vaginal and speculum exam. Wonder if there some component of fibroids and/or other GU pathology. I would like to get CT scan with contrast of the abdomen and pelvis to rule out pathology such as fibroids, adnexal mass, other pelvic neoplasm. We could  do ultrasound I don't think it would give [place] enough information. Ordering a pelvic MRI would give [place] information about an adenomyosis but I don't think we need to jump immediately to that level of imaging so I would like to do the CT scan with contrast. Unfortunately she does not have insurance and has not yet obtained her [facility] and discount card. I'll set her to follow-up in 2 weeks with her PCP. Hopefully by then she will have obtained a warmth card and I can ask her PCP to follow-up with CT scan with contrast abdomen pelvis. 2. Regarding the apparent polyp, I have sent that off for pathology. I will notify her.

## 2015-05-07 ENCOUNTER — Encounter: Payer: Self-pay | Admitting: Family Medicine

## 2015-05-08 ENCOUNTER — Encounter: Payer: Self-pay | Admitting: Family Medicine

## 2015-05-15 ENCOUNTER — Ambulatory Visit: Payer: Self-pay

## 2015-05-22 ENCOUNTER — Encounter: Payer: Self-pay | Admitting: Family Medicine

## 2015-05-22 ENCOUNTER — Ambulatory Visit (INDEPENDENT_AMBULATORY_CARE_PROVIDER_SITE_OTHER): Payer: Self-pay | Admitting: Family Medicine

## 2015-05-22 VITALS — BP 147/104 | HR 81 | Temp 98.3°F | Ht 64.0 in | Wt 288.2 lb

## 2015-05-22 DIAGNOSIS — R1031 Right lower quadrant pain: Secondary | ICD-10-CM

## 2015-05-22 DIAGNOSIS — J984 Other disorders of lung: Secondary | ICD-10-CM

## 2015-05-22 DIAGNOSIS — I5032 Chronic diastolic (congestive) heart failure: Secondary | ICD-10-CM

## 2015-05-22 DIAGNOSIS — M25559 Pain in unspecified hip: Secondary | ICD-10-CM

## 2015-05-22 MED ORDER — FUROSEMIDE 40 MG PO TABS: 120.0000 mg | ORAL_TABLET | Freq: Every day | ORAL | Status: DC

## 2015-05-22 NOTE — Assessment & Plan Note (Addendum)
Signs and symptoms today consistent with mild CHF exacerbation. Currently stable on room air with no signs of respiratory distress. Unclear etiology, though likely due to inadequate diuresis - patient stated that she was having to take extra lasix last month to cause diuresis. Will refill patient's lasix. Instructed her to take 138m total daily. Instructed patient to increase dose to 1669monce daily if not urinating with 12031motal. Renal function and electrolytes normal last month, will not recheck today.   Will follow up in 1-2 weeks. Return precautions discussed.  Precepted with Dr ChaErin Hearing Patient seems to be somewhat confused about her diuretic regimen - has both lasix and torsemide on med list. Asked patient to bring in medications at next visit for review.

## 2015-05-22 NOTE — Patient Instructions (Signed)
Thank you for coming to the clinic today. It was nice seeing you.  We will set you up to have a CT scan performed.   For your shortness of breath, this is probably due to having fluid on your lungs. I will give you a new prescription for lasix today. Please take 3 pills once daily. If this does not make you pee, please increase to 4 pills once daily. If you are still not peeing or if you feel like you are still short of breath, please call our office.  Please take your albuterol every 4-6 hours over the next 1-2 days to keep your lungs open.   Please come back for follow up in 1-2 weeks.  Take care,   Dr [name]

## 2015-05-22 NOTE — Assessment & Plan Note (Signed)
Patient's dyspnea most likely due to volume overload, though may have a small component of pulmonary disease. Instructed patient to continue controlled medications as prescribed, but to use albuterol every 4-6 hours over the next 1-2 days. Return precautions given.  Patient additionally seemed confused about which inhalers were for use as needed and which were controllers. Asked patient to bring in medications for review at next visit.

## 2015-05-22 NOTE — Assessment & Plan Note (Addendum)
Pain currently stable. Will proceed with CT abd/pelvis with contrast per Dr Verlon Au note from [facility].

## 2015-05-22 NOTE — Progress Notes (Signed)
Subjective:  Hailey Barnes is a 54 y.o. female who presents to the [facility] today with a chief complaint of pelvic pain follow up.   HPI:  Pelvic Pain Chronic pain in RLQ. Described as achy. Constant pain. Was seen in [place] clinic 2 weeks ago and had polyp removed. Pain about the same as prior office visits.   Reports feeling very nauseated. No vomiting or diarrhea. No vaginal bleeding.   Dyspnea Patient reports that she has noted increased shortness of breath for the past few days. Also feels like there is a "rattling" in her chest. Patient reports using home inhalers which have helped some.   Volume Overload Also reports that she feels like she has been retaining fluid. Says that she has noticed about a 5 pound weight gain over the past 2 weeks. Reports that she has been taking torsemide, though has not noticed much of an effect. Has been sleeping on 4 pillows. Endorses occasional PND. Feels increased swelling all over.  ROS: No headache or vision changes, otherwise all systems reviewed and are negative  PMH:  The following were reviewed and entered/updated in epic: Past Medical History  Diagnosis Date  . 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   Marland Kitchen Hypertension   . COPD (chronic obstructive pulmonary disease)   . Tingling in extremities 12/12/2010    Uric acid and electrolytes (02/23) normal but WBC elevated.   D/Dx: carpal tunnel, ulnar neuropathy Less likely: cervical radiculopathy, vasculitis   . CHF (congestive heart failure)   . Chronic bronchitis     "get it q yr" (02/21/2014)  . Arthritis     "knees" (02/21/2014)  . Complication of anesthesia     "I don't come out well; I chew on my tongue"  . Heart murmur   . GERD (gastroesophageal reflux disease)   . Migraine 1982-2009  . Stroke noted on CAT 02/2014    "light", LLE weakness remains (03/30/2014)  . Chronic lower back pain   . Shortness of breath    Patient Active Problem List   Diagnosis  Date Noted  . Depression 04/19/2015  . RLQ abdominal pain 04/19/2015  . Abnormal uterine bleeding 12/07/2014  . COPD exacerbation 10/06/2014  . Allergic rhinitis 08/01/2014  . Rash and nonspecific skin eruption 08/01/2014  . Eczema 08/01/2014  . Diminished vision 06/23/2014  . Chronic diastolic CHF (congestive heart failure) 06/13/2014  . Restrictive lung disease 05/10/2014  . Chest pain at rest 03/30/2014  . Vaginal discharge 03/22/2014  . NASH (nonalcoholic steatohepatitis) 01/04/2014  . Diastolic dysfunction 28/41/3244  . Hypertension 05/26/2010  . OBESITY, NOS 12/17/2006  . TOBACCO DEPENDENCE 12/17/2006   Past Surgical History  Procedure Laterality Date  . Cesarean section  1982  . Foot surgery Bilateral     "took bones out; put pins in"  . Left and right heart catheterization with coronary angiogram N/A 03/31/2014    Procedure: LEFT AND RIGHT HEART CATHETERIZATION WITH CORONARY ANGIOGRAM;  Surgeon: Birdie Riddle, MD;  Location: [place] CATH LAB;  Service: Cardiovascular;  Laterality: N/A;     Objective:  Physical Exam: BP 147/104 mmHg  Pulse 81  Temp(Src) 98.3 F (36.8 C) (Oral)  Ht 5' 4"  (1.626 m)  Wt 288 lb 3.2 oz (130.727 kg)  BMI 49.45 kg/m2  Gen: NAD, resting comfortably CV: RRR with no murmurs appreciated Lungs: NWOB, speaking in full sentences. CTAB with mild fine bibasilar crackles. No wheezes.  GI: Obese, +BS,  NT, ND MSK: no edema, cyanosis, or clubbing noted Skin: warm, dry Neuro: grossly normal, moves all extremities Psych: Normal affect and thought content  Assessment/Plan:  RLQ abdominal pain Pain currently stable. Will proceed with CT abd/pelvis with contrast per Dr Verlon Au note from [place] clinic.   Chronic diastolic CHF (congestive heart failure) Signs and symptoms today consistent with mild CHF exacerbation. Currently stable on room air with no signs of respiratory distress. Unclear etiology, though likely due to inadequate diuresis - patient stated  that she was having to take extra lasix last month to cause diuresis. Will refill patient's lasix. Instructed her to take 163m total daily. Instructed patient to increase dose to 1672monce daily if not urinating with 12045motal. Renal function and electrolytes normal last month, will not recheck today.   Will follow up in 1-2 weeks. Return precautions discussed.  Precepted with Dr ChaErin Hearing Patient seems to be somewhat confused about her diuretic regimen - has both lasix and torsemide on med list. Asked patient to bring in medications at next visit for review.   Restrictive lung disease Patient's dyspnea most likely due to volume overload, though may have a small component of pulmonary disease. Instructed patient to continue controlled medications as prescribed, but to use albuterol every 4-6 hours over the next 1-2 days. Return precautions given.  Patient additionally seemed confused about which inhalers were for use as needed and which were controllers. Asked patient to bring in medications for review at next visit.     CalAlgis GreenhousearJerline PainD [place]ident PGY-2 05/22/2015 4:30 PM

## 2015-05-25 ENCOUNTER — Ambulatory Visit (HOSPITAL_COMMUNITY): Payer: Self-pay

## 2015-05-29 ENCOUNTER — Other Ambulatory Visit (HOSPITAL_COMMUNITY): Payer: Self-pay | Admitting: Orthopaedic Surgery

## 2015-05-29 DIAGNOSIS — M545 Low back pain: Secondary | ICD-10-CM

## 2015-06-01 ENCOUNTER — Ambulatory Visit (HOSPITAL_COMMUNITY)
Admission: RE | Admit: 2015-06-01 | Discharge: 2015-06-01 | Disposition: A | Discharge status: Home / Self Care | Payer: Self-pay | Source: Ambulatory Visit | Attending: Family Medicine | Admitting: Family Medicine

## 2015-06-01 ENCOUNTER — Encounter (HOSPITAL_COMMUNITY): Payer: Self-pay

## 2015-06-01 DIAGNOSIS — E279 Disorder of adrenal gland, unspecified: Secondary | ICD-10-CM | POA: Insufficient documentation

## 2015-06-01 DIAGNOSIS — K760 Fatty (change of) liver, not elsewhere classified: Secondary | ICD-10-CM | POA: Insufficient documentation

## 2015-06-01 DIAGNOSIS — M79659 Pain in unspecified thigh: Secondary | ICD-10-CM | POA: Insufficient documentation

## 2015-06-01 DIAGNOSIS — R160 Hepatomegaly, not elsewhere classified: Secondary | ICD-10-CM | POA: Insufficient documentation

## 2015-06-01 DIAGNOSIS — E278 Other specified disorders of adrenal gland: Secondary | ICD-10-CM | POA: Insufficient documentation

## 2015-06-01 DIAGNOSIS — N7011 Chronic salpingitis: Secondary | ICD-10-CM | POA: Insufficient documentation

## 2015-06-01 DIAGNOSIS — M898X5 Other specified disorders of bone, thigh: Secondary | ICD-10-CM | POA: Insufficient documentation

## 2015-06-01 DIAGNOSIS — M25559 Pain in unspecified hip: Secondary | ICD-10-CM | POA: Insufficient documentation

## 2015-06-01 MED ORDER — IOHEXOL 300 MG/ML  SOLN
100.0000 mL | Freq: Once | INTRAMUSCULAR | Status: AC | PRN
  Administered 2015-06-01: 100 mL via INTRAVENOUS

## 2015-06-06 ENCOUNTER — Ambulatory Visit (HOSPITAL_COMMUNITY)
Admission: RE | Admit: 2015-06-06 | Discharge: 2015-06-06 | Disposition: A | Discharge status: Home / Self Care | Payer: Self-pay | Source: Ambulatory Visit | Attending: Orthopaedic Surgery | Admitting: Orthopaedic Surgery

## 2015-06-06 ENCOUNTER — Encounter (HOSPITAL_COMMUNITY): Payer: Self-pay

## 2015-06-06 DIAGNOSIS — M545 Low back pain: Secondary | ICD-10-CM

## 2015-06-06 DIAGNOSIS — M5136 Other intervertebral disc degeneration, lumbar region: Secondary | ICD-10-CM | POA: Insufficient documentation

## 2015-06-06 DIAGNOSIS — M47896 Other spondylosis, lumbar region: Secondary | ICD-10-CM | POA: Insufficient documentation

## 2015-06-06 DIAGNOSIS — N7011 Chronic salpingitis: Secondary | ICD-10-CM | POA: Insufficient documentation

## 2015-06-13 ENCOUNTER — Telehealth: Payer: Self-pay | Admitting: Family Medicine

## 2015-06-13 DIAGNOSIS — N7011 Chronic salpingitis: Secondary | ICD-10-CM

## 2015-06-13 NOTE — Telephone Encounter (Signed)
Discussed CT results with patient. Hydrosalpinx noted. This may be the source of patient's abdominal pain. Will refer to gyn.  [name]. [name], [place] Resident PGY-2 06/13/2015 1:46 PM

## 2015-06-14 ENCOUNTER — Encounter: Payer: Self-pay | Admitting: Obstetrics & Gynecology

## 2015-06-14 ENCOUNTER — Emergency Department (HOSPITAL_COMMUNITY): Payer: Self-pay

## 2015-06-14 ENCOUNTER — Emergency Department (HOSPITAL_COMMUNITY)
Admission: EM | Admit: 2015-06-14 | Discharge: 2015-06-15 | Disposition: A | Discharge status: Home / Self Care | Payer: Self-pay | Attending: Emergency Medicine | Admitting: Emergency Medicine

## 2015-06-14 ENCOUNTER — Encounter (HOSPITAL_COMMUNITY): Payer: Self-pay | Admitting: *Deleted

## 2015-06-14 DIAGNOSIS — R1011 Right upper quadrant pain: Secondary | ICD-10-CM | POA: Insufficient documentation

## 2015-06-14 DIAGNOSIS — Z8673 Personal history of transient ischemic attack (TIA), and cerebral infarction without residual deficits: Secondary | ICD-10-CM | POA: Insufficient documentation

## 2015-06-14 DIAGNOSIS — J441 Chronic obstructive pulmonary disease with (acute) exacerbation: Secondary | ICD-10-CM | POA: Insufficient documentation

## 2015-06-14 DIAGNOSIS — Z7952 Long term (current) use of systemic steroids: Secondary | ICD-10-CM | POA: Insufficient documentation

## 2015-06-14 DIAGNOSIS — Z9889 Other specified postprocedural states: Secondary | ICD-10-CM | POA: Insufficient documentation

## 2015-06-14 DIAGNOSIS — R1013 Epigastric pain: Secondary | ICD-10-CM | POA: Insufficient documentation

## 2015-06-14 DIAGNOSIS — Z7951 Long term (current) use of inhaled steroids: Secondary | ICD-10-CM | POA: Insufficient documentation

## 2015-06-14 DIAGNOSIS — G8929 Other chronic pain: Secondary | ICD-10-CM | POA: Insufficient documentation

## 2015-06-14 DIAGNOSIS — I509 Heart failure, unspecified: Secondary | ICD-10-CM | POA: Insufficient documentation

## 2015-06-14 DIAGNOSIS — Z90710 Acquired absence of both cervix and uterus: Secondary | ICD-10-CM | POA: Insufficient documentation

## 2015-06-14 DIAGNOSIS — Z79899 Other long term (current) drug therapy: Secondary | ICD-10-CM | POA: Insufficient documentation

## 2015-06-14 DIAGNOSIS — Z720 Tobacco use: Secondary | ICD-10-CM | POA: Insufficient documentation

## 2015-06-14 DIAGNOSIS — R0602 Shortness of breath: Secondary | ICD-10-CM

## 2015-06-14 DIAGNOSIS — I10 Essential (primary) hypertension: Secondary | ICD-10-CM | POA: Insufficient documentation

## 2015-06-14 DIAGNOSIS — K219 Gastro-esophageal reflux disease without esophagitis: Secondary | ICD-10-CM | POA: Insufficient documentation

## 2015-06-14 DIAGNOSIS — Z792 Long term (current) use of antibiotics: Secondary | ICD-10-CM | POA: Insufficient documentation

## 2015-06-14 DIAGNOSIS — M170 Bilateral primary osteoarthritis of knee: Secondary | ICD-10-CM | POA: Insufficient documentation

## 2015-06-14 DIAGNOSIS — R011 Cardiac murmur, unspecified: Secondary | ICD-10-CM | POA: Insufficient documentation

## 2015-06-14 LAB — D-DIMER, QUANTITATIVE (NOT AT ARMC): D DIMER QUANT: 0.73 ug{FEU}/mL — AB (ref 0.00–0.48)

## 2015-06-14 LAB — CBC WITH DIFFERENTIAL/PLATELET
BASOS PCT: 0 % (ref 0–1)
Basophils Absolute: 0 10*3/uL (ref 0.0–0.1)
EOS PCT: 2 % (ref 0–5)
Eosinophils Absolute: 0.2 10*3/uL (ref 0.0–0.7)
HCT: 51.6 % — ABNORMAL HIGH (ref 36.0–46.0)
Hemoglobin: 18.1 g/dL — ABNORMAL HIGH (ref 12.0–15.0)
Lymphocytes Relative: 28 % (ref 12–46)
Lymphs Abs: 3.2 10*3/uL (ref 0.7–4.0)
MCH: 29 pg (ref 26.0–34.0)
MCHC: 35.1 g/dL (ref 30.0–36.0)
MCV: 82.6 fL (ref 78.0–100.0)
MONO ABS: 0.9 10*3/uL (ref 0.1–1.0)
Monocytes Relative: 7 % (ref 3–12)
NEUTROS ABS: 7.3 10*3/uL (ref 1.7–7.7)
Neutrophils Relative %: 63 % (ref 43–77)
Platelets: 214 10*3/uL (ref 150–400)
RBC: 6.25 MIL/uL — ABNORMAL HIGH (ref 3.87–5.11)
RDW: 14.7 % (ref 11.5–15.5)
WBC: 11.7 10*3/uL — ABNORMAL HIGH (ref 4.0–10.5)

## 2015-06-14 LAB — COMPREHENSIVE METABOLIC PANEL
ALBUMIN: 4 g/dL (ref 3.5–5.0)
ALT: 18 U/L (ref 14–54)
AST: 32 U/L (ref 15–41)
Alkaline Phosphatase: 96 U/L (ref 38–126)
Anion gap: 7 (ref 5–15)
BUN: 14 mg/dL (ref 6–20)
CO2: 26 mmol/L (ref 22–32)
CREATININE: 1.03 mg/dL — AB (ref 0.44–1.00)
Calcium: 9.1 mg/dL (ref 8.9–10.3)
Chloride: 102 mmol/L (ref 101–111)
GFR calc Af Amer: >60 mL/min (ref >60)
GFR calc non Af Amer: >60 mL/min (ref >60)
GLUCOSE: 102 mg/dL — AB (ref 65–99)
POTASSIUM: 4.6 mmol/L (ref 3.5–5.1)
Sodium: 135 mmol/L (ref 135–145)
Total Bilirubin: 1.7 mg/dL — ABNORMAL HIGH (ref 0.3–1.2)
Total Protein: 7.8 g/dL (ref 6.5–8.1)

## 2015-06-14 LAB — I-STAT TROPONIN, ED: TROPONIN I, POC: 0 ng/mL (ref 0.00–0.08)

## 2015-06-14 LAB — BRAIN NATRIURETIC PEPTIDE: B Natriuretic Peptide: 15.2 pg/mL (ref 0.0–100.0)

## 2015-06-14 LAB — LIPASE, BLOOD: Lipase: 26 U/L (ref 22–51)

## 2015-06-14 MED ORDER — MORPHINE SULFATE (PF) 4 MG/ML IV SOLN
4.0000 mg | Freq: Once | INTRAVENOUS | Status: AC
  Administered 2015-06-14: 4 mg via INTRAVENOUS
  Filled 2015-06-14: qty 1

## 2015-06-14 MED ORDER — IOHEXOL 350 MG/ML SOLN
100.0000 mL | Freq: Once | INTRAVENOUS | Status: AC | PRN
  Administered 2015-06-15: 80 mL via INTRAVENOUS

## 2015-06-14 MED ORDER — ONDANSETRON HCL 4 MG/2ML IJ SOLN
4.0000 mg | Freq: Once | INTRAMUSCULAR | Status: AC
  Administered 2015-06-14: 4 mg via INTRAVENOUS
  Filled 2015-06-14: qty 2

## 2015-06-14 NOTE — ED Notes (Signed)
Pt c/o epigastric pain 4 -5 days; pt c/o nausea with no vomiting; pt states that the pain is worse after eating; pt states that she also feels short of breath and tight to her chest; pt states that she has a hx of CHF and thinks she may have too much fluid on her lungs

## 2015-06-14 NOTE — ED Notes (Signed)
EDP at bedside  

## 2015-06-14 NOTE — ED Provider Notes (Signed)
Complains of right upper quadrant pain nonradiating onset approximate 4 days ago. Pain is worse with eating intermittent last 2 minutes at a time she is presently pain-free. She also complains of shortness of breath for the past 2 days feels like similar to congestive heart failure. She has chronic orthopnea. On exam patient in no distress, speaks in paragraphs HEENT exam no facial asymmetry neck supple no JVD lungs clear breath sounds heart regular rate and rhythm abdomen obese, tender at right upper quadrant no guarding or rigidity. Extremities without peripheral edema  Orlie Dakin, MD 06/14/15 2154

## 2015-06-14 NOTE — ED Provider Notes (Signed)
CSN: [id number]     Arrival date & time 06/14/15  1950 History   First MD Initiated Contact with Patient 06/14/15 2025     Chief Complaint  Patient presents with  . Abdominal Pain  . Shortness of Breath     (Consider location/radiation/quality/duration/timing/severity/associated sxs/prior Treatment) HPI Comments: Patient presents today with a chief complaint of abdominal pain.  Pain has been intermittent for the past 4-5 days.  Pain located in the epigastric and RUQ.  She states that the pain becomes worse after eating.  She describes the pain as "spasms."  She has not taken anything for pain prior to arrival.  She denies any history of Gall Stones, PUD, or GERD.  She reports a history of abdominal hysterectomy, but no other abdominal surgeries.  She is also complaining of some SOB.  She states that it feels that she has "fluid in her lungs."  She reports a history of COPD and CHF.  She is currently on Lasix 120 mg daily, which she reports that she is taking as directed.  She reports associated orthopnea, but states that this is unchanged over the past year.  She denies cough, fever, chills, chest pain, and urinary symptoms.  She currently smokes 10 cigs/day.  She denies prolonged travel or surgeries in the past 4 weeks.  No history of DVT or PE.  The history is provided by the patient.    Past Medical History  Diagnosis Date  . 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   Marland Kitchen Hypertension   . COPD (chronic obstructive pulmonary disease)   . Tingling in extremities 12/12/2010    Uric acid and electrolytes (02/23) normal but WBC elevated.   D/Dx: carpal tunnel, ulnar neuropathy Less likely: cervical radiculopathy, vasculitis   . CHF (congestive heart failure)   . Chronic bronchitis     "get it q yr" (02/21/2014)  . Arthritis     "knees" (02/21/2014)  . Complication of anesthesia     "I don't come out well; I chew on my tongue"  . Heart murmur   . GERD (gastroesophageal  reflux disease)   . Migraine 1982-2009  . Stroke noted on CAT 02/2014    "light", LLE weakness remains (03/30/2014)  . Chronic lower back pain   . Shortness of breath    Past Surgical History  Procedure Laterality Date  . Cesarean section  1982  . Foot surgery Bilateral     "took bones out; put pins in"  . Left and right heart catheterization with coronary angiogram N/A 03/31/2014    Procedure: LEFT AND RIGHT HEART CATHETERIZATION WITH CORONARY ANGIOGRAM;  Surgeon: Birdie Riddle, MD;  Location: [place] CATH LAB;  Service: Cardiovascular;  Laterality: N/A;   Family History  Problem Relation Age of Onset  . Hypertension Mother   . Hypertension Father    Social History  Substance Use Topics  . Smoking status: Current Every Day Smoker -- 0.25 packs/day for 37 years    Types: Cigarettes  . Smokeless tobacco: Never Used     Comment: taking chantix  . Alcohol Use: No   OB History    Gravida Para Term Preterm AB TAB SAB Ectopic Multiple Living   1 1        1      Review of Systems  All other systems reviewed and are negative.     Allergies  Review of patient's allergies indicates no known allergies.  Home Medications  Prior to Admission medications   Medication Sig Start Date End Date Taking? Authorizing Provider  acetaminophen (TYLENOL) 500 MG tablet Take 1,000 mg by mouth every 8 (eight) hours as needed for moderate pain.  03/09/14   Zenia Resides, MD  albuterol (PROVENTIL HFA;VENTOLIN HFA) 108 (90 BASE) MCG/ACT inhaler Inhale 2 puffs into the lungs 2 (two) times daily. 05/04/15   Vivi Barrack, MD  aspirin 81 MG tablet Take 81 mg by mouth daily.    Historical Provider, MD  carvedilol (COREG) 25 MG tablet Take 1 tablet (25 mg total) by mouth 2 (two) times daily with a meal. 12/05/14   Leone Haven, MD  cyclobenzaprine (FLEXERIL) 10 MG tablet Take 1 tablet (10 mg total) by mouth 3 (three) times daily as needed for muscle spasms. 06/23/14   Leone Haven, MD  fluconazole  (DIFLUCAN) 150 MG tablet Take 1 tablet (150 mg total) by mouth once. 2nd tablet in 3 days if no better. 11/29/14   Hilton Sinclair, MD  fluticasone (FLONASE) 50 MCG/ACT nasal spray Place 2 sprays into both nostrils daily. 10/10/14   Hilton Sinclair, MD  furosemide (LASIX) 40 MG tablet Take 3 tablets (120 mg total) by mouth daily. 05/22/15   Vivi Barrack, MD  gabapentin (NEURONTIN) 100 MG capsule Take 100 mg by mouth 3 (three) times daily.    Historical Provider, MD  hydrocortisone 1 % ointment Apply 1 application topically 2 (two) times daily. For 5 days. 12/05/14   Leone Haven, MD  ibuprofen (ADVIL,MOTRIN) 200 MG tablet Take 400 mg by mouth every 6 (six) hours as needed (pain).    Historical Provider, MD  lisinopril (PRINIVIL,ZESTRIL) 10 MG tablet Take 1 tablet (10 mg total) by mouth daily. 04/17/15   Leone Haven, MD  loratadine (CLARITIN) 10 MG tablet Take 1 tablet (10 mg total) by mouth daily. 08/01/14   Leone Haven, MD  metroNIDAZOLE (FLAGYL) 500 MG tablet Take 1 tablet (500 mg total) by mouth 2 (two) times daily. 04/19/15   Leone Haven, MD  mometasone-formoterol [facility]) 200-5 MCG/ACT AERO Inhale 2 puffs into the lungs 2 (two) times daily.    Historical Provider, MD  permethrin (ELIMITE) 5 % cream Apply 1 application topically once. Apply cream from head to toe; leave on for 8-14 hours before washing off 08/01/14   Leone Haven, MD  predniSONE (DELTASONE) 50 MG tablet Take 1 tablet (50 mg total) by mouth daily with breakfast. 12/05/14   Leone Haven, MD  sertraline (ZOLOFT) 50 MG tablet Take 1 tablet (50 mg total) by mouth daily. 04/17/15   Leone Haven, MD  spironolactone (ALDACTONE) 25 MG tablet Take 1 tablet (25 mg total) by mouth daily. 04/17/15   Leone Haven, MD  tiotropium (SPIRIVA) 18 MCG inhalation capsule Place 18 mcg into inhaler and inhale daily.    Historical Provider, MD  triamcinolone cream (KENALOG) 0.1 % Apply 1 application topically  2 (two) times daily. 12/05/14   Leone Haven, MD  varenicline (CHANTIX) 1 MG tablet Take 1 tablet (1 mg total) by mouth 2 (two) times daily. 09/07/14   Leone Haven, MD   BP 159/111 mmHg  Pulse 80  Temp(Src) 98 F (36.7 C) (Oral)  Resp 20  SpO2 96% Physical Exam  Constitutional: She appears well-developed and well-nourished.  Morbidly obese  HENT:  Head: Normocephalic and atraumatic.  Mouth/Throat: Oropharynx is clear and moist.  Neck: Normal range of motion. Neck  supple.  Cardiovascular: Normal rate, regular rhythm and normal heart sounds.   Pulmonary/Chest: Effort normal and breath sounds normal.  Abdominal: Soft. Bowel sounds are normal. She exhibits no distension and no mass. There is tenderness in the right upper quadrant and epigastric area. There is no rebound and no guarding.  Obese abdomen  Musculoskeletal: Normal range of motion.  Trace bilateral edema of lower extremities  Neurological: She is alert.  Skin: Skin is warm and dry.  Psychiatric: She has a normal mood and affect.  Nursing note and vitals reviewed.   ED Course  Procedures (including critical care time) Labs Review Labs Reviewed  CBC WITH DIFFERENTIAL/PLATELET - Abnormal; Notable for the following:    WBC 11.7 (*)    RBC 6.25 (*)    Hemoglobin 18.1 (*)    HCT 51.6 (*)    All other components within normal limits  COMPREHENSIVE METABOLIC PANEL - Abnormal; Notable for the following:    Glucose, Bld 102 (*)    Creatinine, Ser 1.03 (*)    Total Bilirubin 1.7 (*)    All other components within normal limits  D-DIMER, QUANTITATIVE (NOT AT [facility]) - Abnormal; Notable for the following:    D-Dimer, Quant 0.73 (*)    All other components within normal limits  LIPASE, BLOOD  BRAIN NATRIURETIC PEPTIDE  I-STAT TROPOININ, ED    Imaging Review Dg Chest 2 View  06/14/2015   CLINICAL DATA:  Shortness of breath and chest tightness for 5 days.  EXAM: CHEST  2 VIEW  COMPARISON:  December 06, 2014   FINDINGS: The heart size and mediastinal contours are within normal limits. There is no focal infiltrate, pulmonary edema, or pleural effusion. The visualized skeletal structures are stable.  IMPRESSION: No active cardiopulmonary disease.   Electronically Signed   By: Abelardo Diesel M.D.   On: 06/14/2015 21:38   Ct Angio Chest Pe W/cm &/or Wo Cm  06/15/2015   CLINICAL DATA:  Shortness of breath and chest tightness. Nausea. Epigastric pain for 45 days.  EXAM: CT ANGIOGRAPHY CHEST WITH CONTRAST  TECHNIQUE: Multidetector CT imaging of the chest was performed using the standard protocol during bolus administration of intravenous contrast. Multiplanar CT image reconstructions and MIPs were obtained to evaluate the vascular anatomy.  CONTRAST:  80 mL Omnipaque 350 IV  COMPARISON:  Chest radiograph 1 day prior.  FINDINGS: There are no filling defects within the pulmonary arteries to suggest pulmonary embolus. Mild motion artifact through the lung bases limiting assessment.  Normal caliber mildly tortuous thoracic aorta. Heart is normal in size. There is no pleural or pericardial effusion. No mediastinal or hilar adenopathy. Thyroid gland is normal. The esophagus is decompressed.  Mild motion artifact through the lung bases limiting assessment. No parenchymal consolidation, pulmonary nodule or mass. Minimal geographic attenuation a lower lobes, may reflect air trapping. Trachea and mainstem bronchi are patent.  Evaluation of the upper abdomen demonstrates hepatomegaly and hepatic steatosis. Mild thickening of the left adrenal gland. No acute abnormality.  There are no acute or suspicious osseous abnormalities. Degenerative change noted in the thoracic spine.  Review of the MIP images confirms the above findings.  IMPRESSION: 1. No pulmonary embolus. 2. Suspect mild air trapping at the lung bases. There is otherwise no acute intrathoracic process.   Electronically Signed   By: Jeb Levering M.D.   On: 06/15/2015 00:48    US Abdomen Limited  06/14/2015   CLINICAL DATA:  Epigastric pain for 4 days, worse after eating. Right  upper quadrant pain.  EXAM: US ABDOMEN LIMITED - RIGHT UPPER QUADRANT  COMPARISON:  CT 06/01/2015  FINDINGS: Gallbladder:  No gallstones or wall thickening visualized. Wall thickness of 1.8 mm. No sonographic Murphy sign noted.  Common bile duct:  Diameter: 2 mm  Liver:  No focal lesion identified. Diffusely heterogeneous and increased in parenchymal echogenicity. Normal directional flow in the main portal vein.  IMPRESSION: 1. Normal sonographic appearance of the gallbladder and biliary tree. No gallstones. 2. Hepatic steatosis.   Electronically Signed   By: Jeb Levering M.D.   On: 06/14/2015 21:58   I have personally reviewed and evaluated these images and lab results as part of my medical decision-making.   EKG Interpretation None     ED ECG REPORT   Date: 06/14/2015  Rate: 82  Rhythm: normal sinus rhythm  QRS Axis: normal  Intervals: normal  ST/T Wave abnormalities: nonspecific T wave changes  Conduction Disutrbances:none  Narrative Interpretation:   Old EKG Reviewed: unchanged since 06/08/14  I have personally reviewed the EKG tracing and agree with the computerized printout as noted.   MDM   Final diagnoses:  None   Patient presents today with RUQ and SOB.  On exam, RUQ tender to palpation.  No rebound or guarding.  Labs unremarkable aside from slightly elevated total bilirubin.  Abdominal ultrasound is negative.  Pain improved in the ED.  No vomiting in the ED.  Patient also complaining of SOB.  CXR is negative.  No ischemic changes on EKG.  Troponin negative.  BNP is WNL.  CT angio chest is also negative for PE.  Patient is not hypoxic.  Lungs CTAB.  No signs of respiratory distress.  Feel that the patient is stable for discharge.  Return precautions given.     Hyman Bible, PA-C 06/15/15 [place], MD 06/16/15 [id number]

## 2015-06-14 NOTE — ED Notes (Signed)
Pt to xray

## 2015-06-14 NOTE — ED Notes (Signed)
US at bedside

## 2015-06-15 ENCOUNTER — Encounter (HOSPITAL_COMMUNITY): Payer: Self-pay | Admitting: Radiology

## 2015-06-15 MED ORDER — OMEPRAZOLE 20 MG PO CPDR: 20.0000 mg | DELAYED_RELEASE_CAPSULE | Freq: Every day | ORAL | Status: DC

## 2015-06-15 MED ORDER — ONDANSETRON HCL 4 MG PO TABS: 4.0000 mg | ORAL_TABLET | Freq: Four times a day (QID) | ORAL | Status: DC

## 2015-06-15 NOTE — ED Notes (Signed)
Pt placed on 2L of oxygen for O2 sat between 82-89% while sleeping.

## 2015-06-21 ENCOUNTER — Encounter: Payer: Self-pay | Admitting: Family Medicine

## 2015-06-21 ENCOUNTER — Ambulatory Visit (INDEPENDENT_AMBULATORY_CARE_PROVIDER_SITE_OTHER): Payer: Self-pay | Admitting: Family Medicine

## 2015-06-21 VITALS — BP 145/99 | HR 80 | Temp 98.0°F | Ht 64.0 in | Wt 286.0 lb

## 2015-06-21 DIAGNOSIS — N898 Other specified noninflammatory disorders of vagina: Secondary | ICD-10-CM

## 2015-06-21 LAB — POCT WET PREP (WET MOUNT): Clue Cells Wet Prep Whiff POC: POSITIVE

## 2015-06-21 MED ORDER — METRONIDAZOLE 500 MG PO TABS: 500.0000 mg | ORAL_TABLET | Freq: Two times a day (BID) | ORAL | Status: DC

## 2015-06-21 NOTE — Progress Notes (Signed)
Subjective:  Hailey Barnes is a 54 y.o. female who presents to the [facility] today with a chief complaint of vaginal discharge.   HPI: Vaginal Discharge Patient reports that she has noticed increased vaginal discharge for the past 1-2 weeks. Says that it is dark in color and looks like old blood. Says that her genital area feels stick and itchy. No foul odor. Patient's abdominal is at her baseline. Was diagnosed with BV over a couple months ago. Completed her course of flagyl. Did not have any problems until 1-2 weeks. Tried monostat, but says that it made the discharge worse. No fevers or chills. Patient has not been sexually active since last STD screening.   ROS: No nausea or vomiting, otherwise all systems reviewed and are negative  PMH:  The following were reviewed and entered/updated in epic: Past Medical History  Diagnosis Date  . 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   Marland Kitchen Hypertension   . COPD (chronic obstructive pulmonary disease)   . Tingling in extremities 12/12/2010    Uric acid and electrolytes (02/23) normal but WBC elevated.   D/Dx: carpal tunnel, ulnar neuropathy Less likely: cervical radiculopathy, vasculitis   . CHF (congestive heart failure)   . Chronic bronchitis     "get it q yr" (02/21/2014)  . Arthritis     "knees" (02/21/2014)  . Complication of anesthesia     "I don't come out well; I chew on my tongue"  . Heart murmur   . GERD (gastroesophageal reflux disease)   . Migraine 1982-2009  . Stroke noted on CAT 02/2014    "light", LLE weakness remains (03/30/2014)  . Chronic lower back pain   . Shortness of breath    Patient Active Problem List   Diagnosis Date Noted  . Depression 04/19/2015  . RLQ abdominal pain 04/19/2015  . Abnormal uterine bleeding 12/07/2014  . COPD exacerbation 10/06/2014  . Allergic rhinitis 08/01/2014  . Rash and nonspecific skin eruption 08/01/2014  . Eczema 08/01/2014  . Diminished vision 06/23/2014  .  Chronic diastolic CHF (congestive heart failure) 06/13/2014  . Restrictive lung disease 05/10/2014  . Chest pain at rest 03/30/2014  . Vaginal discharge 03/22/2014  . NASH (nonalcoholic steatohepatitis) 01/04/2014  . Diastolic dysfunction 24/06/7352  . Hypertension 05/26/2010  . OBESITY, NOS 12/17/2006  . TOBACCO DEPENDENCE 12/17/2006   Past Surgical History  Procedure Laterality Date  . Cesarean section  1982  . Foot surgery Bilateral     "took bones out; put pins in"  . Left and right heart catheterization with coronary angiogram N/A 03/31/2014    Procedure: LEFT AND RIGHT HEART CATHETERIZATION WITH CORONARY ANGIOGRAM;  Surgeon: Birdie Riddle, MD;  Location: [place] CATH LAB;  Service: Cardiovascular;  Laterality: N/A;     Objective:  Physical Exam: BP 145/99 mmHg  Pulse 80  Temp(Src) 98 F (36.7 C) (Oral)  Ht 5' 4"  (1.626 m)  Wt 286 lb (129.729 kg)  BMI 49.07 kg/m2  Gen: NAD, resting comfortably CV: RRR with no murmurs appreciated Lungs: NWOB, CTAB with no crackles, wheezes, or rhonchi GI: Normal bowel sounds present. Soft, Nontender, Nondistended. GU: Normal appearing external genitalia. Normal internal genitalia. Scant amount of thin white discharge.  MSK: no edema, cyanosis, or clubbing noted Skin: warm, dry Neuro: grossly normal, moves all extremities Psych: Normal affect and thought content  Results for orders placed or performed in visit on 06/21/15 (from the past 72 hour(s))  POCT  Wet Prep [facility] [place])     Status: Abnormal   Collection Time: 06/21/15  3:19 PM  Result Value Ref Range   Source Wet Prep POC VAG    WBC, Wet Prep HPF POC 1-5    Bacteria Wet Prep HPF POC Many (A) None, Few   Clue Cells Wet Prep HPF POC Moderate (A) None   Clue Cells Wet Prep Whiff POC Positive Whiff    Yeast Wet Prep HPF POC None    Trichomonas Wet Prep HPF POC NONE      Assessment/Plan:  Vaginal discharge Wet prep consistent with BV. Will treat with Flagyl. Will not repeat GC/CT  today as patient has not been sexually active since last screening. Follow up as needed.   If continues to have recurrent BV, can consider intravaginal metronidazole gel for maintenance therapy.     [name]. [name], [place] Resident PGY-2 06/21/2015 4:14 PM

## 2015-06-21 NOTE — Patient Instructions (Signed)
Your tests today showed bacterial vaginosis. We sent in a prescription to your pharmacy for Flagyl. Please take 1 pill twice a day for 7 days.  Please return if your symptoms are not improving in 1-2 weeks.  Take Care,  Dr [name]

## 2015-06-21 NOTE — Assessment & Plan Note (Signed)
Wet prep consistent with BV. Will treat with Flagyl. Will not repeat GC/CT today as patient has not been sexually active since last screening. Follow up as needed.   If continues to have recurrent BV, can consider intravaginal metronidazole gel for maintenance therapy.

## 2015-07-05 ENCOUNTER — Ambulatory Visit (INDEPENDENT_AMBULATORY_CARE_PROVIDER_SITE_OTHER): Payer: Self-pay | Admitting: Obstetrics & Gynecology

## 2015-07-05 ENCOUNTER — Encounter: Payer: Self-pay | Admitting: Obstetrics & Gynecology

## 2015-07-05 VITALS — BP 157/107 | HR 88 | Temp 97.6°F | Ht 64.5 in | Wt 286.5 lb

## 2015-07-05 DIAGNOSIS — N7011 Chronic salpingitis: Secondary | ICD-10-CM

## 2015-07-05 DIAGNOSIS — R1031 Right lower quadrant pain: Secondary | ICD-10-CM

## 2015-07-05 NOTE — Progress Notes (Signed)
Patient ID: Hailey Barnes, female   DOB: 1960/12/13, 54 y.o.   MRN: [id number]  Chief Complaint  Patient presents with  . Gynecologic Exam  many years for RLQ pain  HPI Hailey Barnes is a 54 y.o. female.  G1P1 No LMP recorded. Patient is not currently having periods (Reason: Irregular Periods). Light menses every 2-3 months. Many years of RLQ pain and had laparoscopy by Dr Garwin Brothers 2003 with adhesions, right hydrosalpinx and ovarian cyst. Due to adhesions she elected to not do salpingectomy at that time.  HPI  Past Medical History  Diagnosis Date  . 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   Marland Kitchen Hypertension   . COPD (chronic obstructive pulmonary disease)   . Tingling in extremities 12/12/2010    Uric acid and electrolytes (02/23) normal but WBC elevated.   D/Dx: carpal tunnel, ulnar neuropathy Less likely: cervical radiculopathy, vasculitis   . CHF (congestive heart failure)   . Chronic bronchitis     "get it q yr" (02/21/2014)  . Arthritis     "knees" (02/21/2014)  . Complication of anesthesia     "I don't come out well; I chew on my tongue"  . Heart murmur   . GERD (gastroesophageal reflux disease)   . Migraine 1982-2009  . Stroke noted on CAT 02/2014    "light", LLE weakness remains (03/30/2014)  . Chronic lower back pain   . Shortness of breath     Past Surgical History  Procedure Laterality Date  . Cesarean section  1982  . Foot surgery Bilateral     "took bones out; put pins in"  . Left and right heart catheterization with coronary angiogram N/A 03/31/2014    Procedure: LEFT AND RIGHT HEART CATHETERIZATION WITH CORONARY ANGIOGRAM;  Surgeon: Birdie Riddle, MD;  Location: [facility];  Service: Cardiovascular;  Laterality: N/A;    Family History  Problem Relation Age of Onset  . Hypertension Mother   . Hypertension Father     Social History Social History  Substance Use Topics  . Smoking status: Current Every Day Smoker -- 0.25 packs/day for  37 years    Types: Cigarettes  . Smokeless tobacco: Never Used     Comment: taking chantix  . Alcohol Use: No    No Known Allergies  Current Outpatient Prescriptions  Medication Sig Dispense Refill  . albuterol (PROVENTIL HFA;VENTOLIN HFA) 108 (90 BASE) MCG/ACT inhaler Inhale 2 puffs into the lungs 2 (two) times daily. 2 Inhaler 1  . aspirin 81 MG tablet Take 81 mg by mouth daily.    . carvedilol (COREG) 25 MG tablet Take 1 tablet (25 mg total) by mouth 2 (two) times daily with a meal. (Patient taking differently: Take 25 mg by mouth daily. ) 60 tablet 3  . furosemide (LASIX) 40 MG tablet Take 3 tablets (120 mg total) by mouth daily. 90 tablet 0  . gabapentin (NEURONTIN) 100 MG capsule Take 100 mg by mouth at bedtime.     Marland Kitchen lisinopril (PRINIVIL,ZESTRIL) 10 MG tablet Take 1 tablet (10 mg total) by mouth daily. 30 tablet 2  . spironolactone (ALDACTONE) 25 MG tablet Take 1 tablet (25 mg total) by mouth daily. 30 tablet 0  . cyclobenzaprine (FLEXERIL) 10 MG tablet Take 1 tablet (10 mg total) by mouth 3 (three) times daily as needed for muscle spasms. (Patient not taking: Reported on 06/14/2015) 30 tablet 0  . loratadine (CLARITIN) 10 MG tablet Take 1 tablet (10  mg total) by mouth daily. (Patient not taking: Reported on 06/14/2015) 30 tablet 11  . omeprazole (PRILOSEC) 20 MG capsule Take 1 capsule (20 mg total) by mouth daily. (Patient not taking: Reported on 07/05/2015) 30 capsule 0  . ondansetron (ZOFRAN) 4 MG tablet Take 1 tablet (4 mg total) by mouth every 6 (six) hours. (Patient not taking: Reported on 07/05/2015) 12 tablet 0  . sertraline (ZOLOFT) 50 MG tablet Take 1 tablet (50 mg total) by mouth daily. (Patient not taking: Reported on 06/14/2015) 30 tablet 3   No current facility-administered medications for this visit.    Review of Systems Review of Systems  Constitutional: Negative.   Respiratory: Positive for shortness of breath. Negative for wheezing.   Gastrointestinal: Positive for  abdominal pain.  Genitourinary: Positive for pelvic pain and dyspareunia. Negative for vaginal bleeding and vaginal discharge.  Musculoskeletal: Positive for back pain.    Blood pressure 157/107, pulse 88, temperature 97.6 F (36.4 C), height 5' 4.5" (1.638 m), weight 286 lb 8 oz (129.956 kg).  Physical Exam Physical Exam  Constitutional: She is oriented to person, place, and time. She appears well-developed.  obese  Cardiovascular: Normal rate.   Pulmonary/Chest: Effort normal. No respiratory distress.  Abdominal: Soft. She exhibits no mass. There is no tenderness.  Pannus, vertical CS scar retracted  Genitourinary: Vagina normal and uterus normal. No vaginal discharge found.  Not tender no mass  Neurological: She is alert and oriented to person, place, and time.  Skin: Skin is warm and dry.  Psychiatric: She has a normal mood and affect. Her behavior is normal.  Vitals reviewed.   Data Reviewed [place], CT, MRI image reviewed Op note 2003 l/s CLINICAL DATA: Low back pain extending down both legs and to the right hip. Numbness in the right leg. Difficulty walking for 1 month.  EXAM: MRI LUMBAR SPINE WITHOUT CONTRAST  TECHNIQUE: Multiplanar, multisequence MR imaging of the lumbar spine was performed. No intravenous contrast was administered.  COMPARISON: 06/01/2015  FINDINGS: The lowest lumbar type non-rib-bearing vertebra is labeled as L5. The conus medullaris appears normal. Conus level: T12.  There is 5 mm of degenerative anterolisthesis at L4-5 associated with mild disc desiccation and degenerative facet edema bilaterally.  Mild prominence of lumbar epidural adipose tissues.  Serpentine tubular fluid collection along the right adnexa suspicious for hydrosalpinx.  There is a mild disc bulge at T11-12 without overt impingement. Additional findings at individual levels are as follows:  L2-3: Mild right foraminal stenosis due to facet arthropathy and mild  disc bulge.  L3-4: Mild right foraminal stenosis due to facet arthropathy and minimal disc bulge.  L4-5: Moderate to prominent bilateral foraminal stenosis with mild central narrowing of the thecal sac and borderline bilateral subarticular lateral recess stenosis due to disc uncovering, disc bulge, and facet arthropathy.  L5-S1: Prominent bilateral foraminal stenosis due to facet arthropathy.  IMPRESSION: 1. Lumbar spondylosis and degenerative disc disease, causing prominent impingement at L5-S1; moderate to prominent impingement at L4-5 ; and mild impingement at L2-3 and L3-4, as detailed above. 2. Right-sided hydrosalpinx. This appears to have been present for years, and was observed on 06/03/2010.   Electronically Signed  By: Van Clines M.D.  On: 06/06/2015 16:21 Assessment     Chronic RLQ pain and longstanding finding of right hydrosalpinx    Plan    Counseled that she may need pain management referral as she is not a candidate for surgery based on result of previous surgery and her medical condition.  Denell Cothern 07/05/2015, 2:51 PM

## 2015-07-05 NOTE — Patient Instructions (Signed)
Pelvic Pain Female pelvic pain can be caused by many different things and start from a variety of places. Pelvic pain refers to pain that is located in the lower half of the abdomen and between your hips. The pain may occur over a short period of time (acute) or may be reoccurring (chronic). The cause of pelvic pain may be related to disorders affecting the female reproductive organs (gynecologic), but it may also be related to the bladder, kidney stones, an intestinal complication, or muscle or skeletal problems. Getting help right away for pelvic pain is important, especially if there has been severe, sharp, or a sudden onset of unusual pain. It is also important to get help right away because some types of pelvic pain can be life threatening.  CAUSES  Below are only some of the causes of pelvic pain. The causes of pelvic pain can be in one of several categories.   Gynecologic.  Pelvic inflammatory disease.  Sexually transmitted infection.  Ovarian cyst or a twisted ovarian ligament (ovarian torsion).  Uterine lining that grows outside the uterus (endometriosis).  Fibroids, cysts, or tumors.  Ovulation.  Pregnancy.  Pregnancy that occurs outside the uterus (ectopic pregnancy).  Miscarriage.  Labor.  Abruption of the placenta or ruptured uterus.  Infection.  Uterine infection (endometritis).  Bladder infection.  Diverticulitis.  Miscarriage related to a uterine infection (septic abortion).  Bladder.  Inflammation of the bladder (cystitis).  Kidney stone(s).  Gastrointestinal.  Constipation.  Diverticulitis.  Neurologic.  Trauma.  Feeling pelvic pain because of mental or emotional causes (psychosomatic).  Cancers of the bowel or pelvis. EVALUATION  Your caregiver will want to take a careful history of your concerns. This includes recent changes in your health, a careful gynecologic history of your periods (menses), and a sexual history. Obtaining your family  history and medical history is also important. Your caregiver may suggest a pelvic exam. A pelvic exam will help identify the location and severity of the pain. It also helps in the evaluation of which organ system may be involved. In order to identify the cause of the pelvic pain and be properly treated, your caregiver may order tests. These tests may include:   A pregnancy test.  Pelvic ultrasonography.  An X-ray exam of the abdomen.  A urinalysis or evaluation of vaginal discharge.  Blood tests. HOME CARE INSTRUCTIONS   Only take over-the-counter or prescription medicines for pain, discomfort, or fever as directed by your caregiver.   Rest as directed by your caregiver.   Eat a balanced diet.   Drink enough fluids to make your urine clear or pale yellow, or as directed.   Avoid sexual intercourse if it causes pain.   Apply warm or cold compresses to the lower abdomen depending on which one helps the pain.   Avoid stressful situations.   Keep a journal of your pelvic pain. Write down when it started, where the pain is located, and if there are things that seem to be associated with the pain, such as food or your menstrual cycle.  Follow up with your caregiver as directed.  SEEK MEDICAL CARE IF:  Your medicine does not help your pain.  You have abnormal vaginal discharge. SEEK IMMEDIATE MEDICAL CARE IF:   You have heavy bleeding from the vagina.   Your pelvic pain increases.   You feel light-headed or faint.   You have chills.   You have pain with urination or blood in your urine.   You have uncontrolled diarrhea   or vomiting.   You have a fever or persistent symptoms for more than 3 days.  You have a fever and your symptoms suddenly get worse.   You are being physically or sexually abused.  MAKE SURE YOU:  Understand these instructions.  Will watch your condition.  Will get help if you are not doing well or get worse. Document Released:  09/02/2004 Document Revised: 02/20/2014 Document Reviewed: 01/26/2012 Exit[employer] Patient Information 2015 [employer]. This information is not intended to replace advice given to you by your health care provider. Make sure you discuss any questions you have with your health care provider.  

## 2015-07-10 ENCOUNTER — Telehealth: Payer: Self-pay | Admitting: Family Medicine

## 2015-07-10 MED ORDER — FUROSEMIDE 40 MG PO TABS: 120.0000 mg | ORAL_TABLET | Freq: Every day | ORAL | Status: DC

## 2015-07-10 NOTE — Telephone Encounter (Signed)
Rx filled.  [name]. [name], [place] Resident PGY-2 07/10/2015 7:15 PM

## 2015-07-10 NOTE — Telephone Encounter (Signed)
Pt called and would like a refill on her Lasix. She would also like it sent to her [facility]. jw

## 2015-07-13 MED ORDER — FUROSEMIDE 40 MG PO TABS: 120.0000 mg | ORAL_TABLET | Freq: Every day | ORAL | Status: DC

## 2015-07-13 NOTE — Telephone Encounter (Signed)
Pt called because [facility] said they didn't receive the message for the Lasix. Can we call over again since the patient hasn't taken this for 2 days and she is retaining a lot of fluid. Also once we do this can we call the patient so she knows to go pick this up. jw

## 2015-07-13 NOTE — Telephone Encounter (Signed)
Resent Rx for Lasix to [facility].  Derl Barrow, RN

## 2015-07-20 ENCOUNTER — Other Ambulatory Visit (HOSPITAL_COMMUNITY)
Admission: RE | Admit: 2015-07-20 | Discharge: 2015-07-20 | Disposition: A | Discharge status: Home / Self Care | Payer: Self-pay | Source: Ambulatory Visit | Attending: Family Medicine | Admitting: Family Medicine

## 2015-07-20 ENCOUNTER — Ambulatory Visit (INDEPENDENT_AMBULATORY_CARE_PROVIDER_SITE_OTHER): Payer: Self-pay | Admitting: Family Medicine

## 2015-07-20 VITALS — Temp 97.6°F | Wt 289.3 lb

## 2015-07-20 DIAGNOSIS — Z113 Encounter for screening for infections with a predominantly sexual mode of transmission: Secondary | ICD-10-CM | POA: Insufficient documentation

## 2015-07-20 DIAGNOSIS — N898 Other specified noninflammatory disorders of vagina: Secondary | ICD-10-CM | POA: Insufficient documentation

## 2015-07-20 DIAGNOSIS — I10 Essential (primary) hypertension: Secondary | ICD-10-CM

## 2015-07-20 LAB — POCT WET PREP (WET MOUNT): CLUE CELLS WET PREP WHIFF POC: POSITIVE

## 2015-07-20 LAB — RPR

## 2015-07-20 MED ORDER — SPIRONOLACTONE 25 MG PO TABS: 25.0000 mg | ORAL_TABLET | Freq: Every day | ORAL | Status: DC

## 2015-07-20 MED ORDER — METRONIDAZOLE 500 MG PO TABS: 500.0000 mg | ORAL_TABLET | Freq: Two times a day (BID) | ORAL | Status: DC

## 2015-07-20 NOTE — Progress Notes (Signed)
Subjective:  Hailey Barnes is a 54 y.o. female who presents to the [facility] today with a chief complaint of vaginal irritation.   HPI:  Vaginal Irritation Patient presents with 3 days of vaginal irritation. States that she had a boil appear on the right side of her vagina about 3 days ago. Reports that it was very itchy, but denied any pain. Yesterday, it started draining pus-like material. Lesion is about the size of a dime. Patient also reports that she had intercourse about a week ago. Also has noticed increased vaginal discharge with itching inside of the vagina. Also reports some inguinal discomfort. LMP 4 days ago.   No abdominal pain, fevers, or chills.   ROS: Per HPI, otherwise all systems reviewed and are negative  PMH:  The following were reviewed and entered/updated in epic: Past Medical History  Diagnosis Date  . 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   Marland Kitchen Hypertension   . COPD (chronic obstructive pulmonary disease)   . Tingling in extremities 12/12/2010    Uric acid and electrolytes (02/23) normal but WBC elevated.   D/Dx: carpal tunnel, ulnar neuropathy Less likely: cervical radiculopathy, vasculitis   . CHF (congestive heart failure)   . Chronic bronchitis     "get it q yr" (02/21/2014)  . Arthritis     "knees" (02/21/2014)  . Complication of anesthesia     "I don't come out well; I chew on my tongue"  . Heart murmur   . GERD (gastroesophageal reflux disease)   . Migraine 1982-2009  . Stroke noted on CAT 02/2014    "light", LLE weakness remains (03/30/2014)  . Chronic lower back pain   . Shortness of breath    Patient Active Problem List   Diagnosis Date Noted  . Vaginal irritation 07/20/2015  . Hydrosalpinx 07/05/2015  . Depression 04/19/2015  . RLQ abdominal pain 04/19/2015  . Abnormal uterine bleeding 12/07/2014  . COPD exacerbation 10/06/2014  . Allergic rhinitis 08/01/2014  . Rash and nonspecific skin eruption 08/01/2014  .  Eczema 08/01/2014  . Diminished vision 06/23/2014  . Chronic diastolic CHF (congestive heart failure) 06/13/2014  . Restrictive lung disease 05/10/2014  . Chest pain at rest 03/30/2014  . Vaginal discharge 03/22/2014  . NASH (nonalcoholic steatohepatitis) 01/04/2014  . Diastolic dysfunction 62/94/7654  . Hypertension 05/26/2010  . OBESITY, NOS 12/17/2006  . TOBACCO DEPENDENCE 12/17/2006   Past Surgical History  Procedure Laterality Date  . Cesarean section  1982  . Foot surgery Bilateral     "took bones out; put pins in"  . Left and right heart catheterization with coronary angiogram N/A 03/31/2014    Procedure: LEFT AND RIGHT HEART CATHETERIZATION WITH CORONARY ANGIOGRAM;  Surgeon: Birdie Riddle, MD;  Location: [facility];  Service: Cardiovascular;  Laterality: N/A;     Objective:  Physical Exam: Temp(Src) 97.6 F (36.4 C) (Oral)  Wt 289 lb 4.8 oz (131.226 kg)  Gen: NAD, resting comfortably CV: RRR with no murmurs appreciated Lungs: NWOB, CTAB with no crackles, wheezes, or rhonchi GI: Normal bowel sounds present. Soft, Nontender, Nondistended. GU: Small 1cm indurated lesion noted on the inferior aspect of the right labia majora. No fluctuance. No open areas or drainage noted. Left labia majora normal. Scant amount of thin, white discharge noted in vaginal canal with old blood. No intra-vaginal lesions noted.  MSK: Small 1cm macerated area noted in right inguinal crease with no erythema. Extremities with no edema,  cyanosis, or clubbing noted Skin: warm, dry Neuro: grossly normal, moves all extremities Psych: Normal affect and thought content  Results for orders placed or performed in visit on 07/20/15 (from the past 72 hour(s))  POCT Wet Prep [name])     Status: Abnormal   Collection Time: 07/20/15  2:30 PM  Result Value Ref Range   Source Wet Prep POC VAG    WBC, Wet Prep HPF POC OCCASIONAL    Bacteria Wet Prep HPF POC Many (A) None, Few   Clue Cells Wet Prep HPF POC  Many (A) None   Clue Cells Wet Prep Whiff POC Positive Whiff    Yeast Wet Prep HPF POC None    Trichomonas Wet Prep HPF POC NONE      Assessment/Plan:  Vaginal irritation Vaginal/vulvar irritation today most likely due to Bartholin cyst /abscess on right labia majora that has already drained. Lesion would not benefit from I&D today. Instructed patient to return if abscess recurred. Wet prep today consistent with BV. Will treat with course of Flagyl.   Will send GC/CT, HIV, and RPR.     [name]. [name], [place] [facility] Resident PGY-2 07/20/2015 2:52 PM

## 2015-07-20 NOTE — Assessment & Plan Note (Addendum)
Vaginal/vulvar irritation today most likely due to Bartholin cyst /abscess on right labia majora that has already drained. Lesion would not benefit from I&D today. Instructed patient to return if abscess recurred. Wet prep today consistent with BV. Will treat with course of Flagyl.   Will send GC/CT, HIV, and RPR.

## 2015-07-20 NOTE — Patient Instructions (Addendum)
The boil you had is a "Bartholin" cyst or abscess. These are usually small and do not cause any issues. Occasionally they can become larger and have drainage. Usually they go away after they drain. Sometimes they come back and are very painful. If this happens, we can drain it, and place a catheter to keep it from coming back.  Your wet prep showed bacterial vaginosis today. We sent in another prescription to your pharmacy for flagyl. Please take one pill twice daily for 1 week.   We will call you or send a letter with the rest of your results.  The pain in your leg is from a small area where the skin has broke down from rubbing together. You can try placing antibiotic ointment in this area until it is healed.  Please come back in 1-2 months for a regular visit, or sooner as needed.  Take care, Dr [name]

## 2015-07-21 LAB — HIV ANTIBODY (ROUTINE TESTING W REFLEX): HIV: NONREACTIVE

## 2015-07-23 LAB — GC/CHLAMYDIA PROBE AMP (~~LOC~~) NOT AT ARMC
Chlamydia: NEGATIVE
NEISSERIA GONORRHEA: NEGATIVE

## 2015-07-24 ENCOUNTER — Encounter: Payer: Self-pay | Admitting: Family Medicine

## 2015-08-20 ENCOUNTER — Other Ambulatory Visit: Payer: Self-pay | Admitting: Family Medicine

## 2015-08-20 NOTE — Telephone Encounter (Signed)
Rx filled.  [name]. [name], [place] Resident PGY-2 08/20/2015 11:37 AM

## 2015-08-30 ENCOUNTER — Other Ambulatory Visit: Payer: Self-pay | Admitting: Family Medicine

## 2015-08-30 DIAGNOSIS — I10 Essential (primary) hypertension: Secondary | ICD-10-CM

## 2015-08-30 MED ORDER — LISINOPRIL 10 MG PO TABS: 10.0000 mg | ORAL_TABLET | Freq: Every day | ORAL | Status: DC

## 2015-08-30 MED ORDER — SPIRONOLACTONE 25 MG PO TABS: 25.0000 mg | ORAL_TABLET | Freq: Every day | ORAL | Status: DC

## 2015-08-30 MED ORDER — GABAPENTIN 100 MG PO CAPS: 100.0000 mg | ORAL_CAPSULE | Freq: Every day | ORAL | Status: DC

## 2015-08-30 MED ORDER — FUROSEMIDE 40 MG PO TABS: ORAL_TABLET | ORAL | Status: DC

## 2015-08-30 NOTE — Telephone Encounter (Signed)
Rx filled.  [name]. [name], [place] Resident PGY-2 08/30/2015 5:10 PM

## 2015-08-30 NOTE — Telephone Encounter (Signed)
Requesting refill on furosemide, spironolactone, gabapentin, and lisinopril. Pt goes to [facility]/ [address]

## 2015-09-17 ENCOUNTER — Emergency Department (HOSPITAL_COMMUNITY): Payer: Self-pay

## 2015-09-17 ENCOUNTER — Emergency Department (HOSPITAL_COMMUNITY)
Admission: EM | Admit: 2015-09-17 | Discharge: 2015-09-18 | Disposition: A | Discharge status: Home / Self Care | Payer: Self-pay | Attending: Emergency Medicine | Admitting: Emergency Medicine

## 2015-09-17 ENCOUNTER — Encounter (HOSPITAL_COMMUNITY): Payer: Self-pay | Admitting: Emergency Medicine

## 2015-09-17 DIAGNOSIS — K219 Gastro-esophageal reflux disease without esophagitis: Secondary | ICD-10-CM | POA: Insufficient documentation

## 2015-09-17 DIAGNOSIS — J441 Chronic obstructive pulmonary disease with (acute) exacerbation: Secondary | ICD-10-CM | POA: Insufficient documentation

## 2015-09-17 DIAGNOSIS — I509 Heart failure, unspecified: Secondary | ICD-10-CM | POA: Insufficient documentation

## 2015-09-17 DIAGNOSIS — R062 Wheezing: Secondary | ICD-10-CM

## 2015-09-17 DIAGNOSIS — F17210 Nicotine dependence, cigarettes, uncomplicated: Secondary | ICD-10-CM | POA: Insufficient documentation

## 2015-09-17 DIAGNOSIS — Z79899 Other long term (current) drug therapy: Secondary | ICD-10-CM | POA: Insufficient documentation

## 2015-09-17 DIAGNOSIS — R011 Cardiac murmur, unspecified: Secondary | ICD-10-CM | POA: Insufficient documentation

## 2015-09-17 DIAGNOSIS — Z7982 Long term (current) use of aspirin: Secondary | ICD-10-CM | POA: Insufficient documentation

## 2015-09-17 DIAGNOSIS — I10 Essential (primary) hypertension: Secondary | ICD-10-CM | POA: Insufficient documentation

## 2015-09-17 DIAGNOSIS — Z8673 Personal history of transient ischemic attack (TIA), and cerebral infarction without residual deficits: Secondary | ICD-10-CM | POA: Insufficient documentation

## 2015-09-17 DIAGNOSIS — R600 Localized edema: Secondary | ICD-10-CM | POA: Insufficient documentation

## 2015-09-17 DIAGNOSIS — Z9889 Other specified postprocedural states: Secondary | ICD-10-CM | POA: Insufficient documentation

## 2015-09-17 DIAGNOSIS — M170 Bilateral primary osteoarthritis of knee: Secondary | ICD-10-CM | POA: Insufficient documentation

## 2015-09-17 DIAGNOSIS — G43909 Migraine, unspecified, not intractable, without status migrainosus: Secondary | ICD-10-CM | POA: Insufficient documentation

## 2015-09-17 DIAGNOSIS — G8929 Other chronic pain: Secondary | ICD-10-CM | POA: Insufficient documentation

## 2015-09-17 MED ORDER — ALBUTEROL SULFATE (2.5 MG/3ML) 0.083% IN NEBU
5.0000 mg | INHALATION_SOLUTION | Freq: Once | RESPIRATORY_TRACT | Status: AC
Start: 2015-09-18 — End: 2015-09-18
  Administered 2015-09-18: 5 mg via RESPIRATORY_TRACT
  Filled 2015-09-17: qty 6

## 2015-09-17 NOTE — ED Notes (Signed)
Patient caught a cold two weeks ago. Patient states says they gave her fluid pills to help pull fluid off of her. Patient also coughing a lot. Patient says her saliva taste like pus or fluid.

## 2015-09-17 NOTE — ED Notes (Signed)
Called respiratory to bedside for treatment.

## 2015-09-18 LAB — CBC WITH DIFFERENTIAL/PLATELET
BASOS PCT: 0 %
Basophils Absolute: 0 10*3/uL (ref 0.0–0.1)
EOS ABS: 0.2 10*3/uL (ref 0.0–0.7)
Eosinophils Relative: 2 %
HCT: 46.6 % — ABNORMAL HIGH (ref 36.0–46.0)
HEMOGLOBIN: 15.7 g/dL — AB (ref 12.0–15.0)
Lymphocytes Relative: 25 %
Lymphs Abs: 2.9 10*3/uL (ref 0.7–4.0)
MCH: 28.9 pg (ref 26.0–34.0)
MCHC: 33.7 g/dL (ref 30.0–36.0)
MCV: 85.8 fL (ref 78.0–100.0)
MONOS PCT: 8 %
Monocytes Absolute: 0.9 10*3/uL (ref 0.1–1.0)
NEUTROS PCT: 65 %
Neutro Abs: 7.6 10*3/uL (ref 1.7–7.7)
PLATELETS: 213 10*3/uL (ref 150–400)
RBC: 5.43 MIL/uL — ABNORMAL HIGH (ref 3.87–5.11)
RDW: 14.4 % (ref 11.5–15.5)
WBC: 11.6 10*3/uL — AB (ref 4.0–10.5)

## 2015-09-18 LAB — COMPREHENSIVE METABOLIC PANEL
ALBUMIN: 3.7 g/dL (ref 3.5–5.0)
ALK PHOS: 85 U/L (ref 38–126)
ALT: 22 U/L (ref 14–54)
ANION GAP: 8 (ref 5–15)
AST: 22 U/L (ref 15–41)
BUN: 19 mg/dL (ref 6–20)
CHLORIDE: 105 mmol/L (ref 101–111)
CO2: 26 mmol/L (ref 22–32)
Calcium: 9.1 mg/dL (ref 8.9–10.3)
Creatinine, Ser: 0.95 mg/dL (ref 0.44–1.00)
GFR calc Af Amer: >60 mL/min (ref >60)
GFR calc non Af Amer: >60 mL/min (ref >60)
GLUCOSE: 138 mg/dL — AB (ref 65–99)
POTASSIUM: 3.7 mmol/L (ref 3.5–5.1)
SODIUM: 139 mmol/L (ref 135–145)
Total Bilirubin: 0.6 mg/dL (ref 0.3–1.2)
Total Protein: 7 g/dL (ref 6.5–8.1)

## 2015-09-18 MED ORDER — IPRATROPIUM-ALBUTEROL 0.5-2.5 (3) MG/3ML IN SOLN
6.0000 mL | Freq: Once | RESPIRATORY_TRACT | Status: AC
  Administered 2015-09-18: 6 mL via RESPIRATORY_TRACT
  Filled 2015-09-18: qty 3

## 2015-09-18 MED ORDER — PREDNISONE 20 MG PO TABS: 60.0000 mg | ORAL_TABLET | Freq: Every day | ORAL | Status: DC

## 2015-09-18 MED ORDER — BENZONATATE 100 MG PO CAPS
200.0000 mg | ORAL_CAPSULE | Freq: Once | ORAL | Status: AC
Start: 2015-09-18 — End: 2015-09-18
  Administered 2015-09-18: 200 mg via ORAL
  Filled 2015-09-18: qty 2

## 2015-09-18 MED ORDER — PREDNISONE 20 MG PO TABS
60.0000 mg | ORAL_TABLET | Freq: Once | ORAL | Status: AC
  Administered 2015-09-18: 60 mg via ORAL
  Filled 2015-09-18: qty 3

## 2015-09-18 NOTE — ED Provider Notes (Signed)
CSN: [id number]     Arrival date & time 09/17/15  2323 History  By signing my name below, I, Irene Pap, attest that this documentation has been prepared under the direction and in the presence of Everlene Balls, MD. Electronically Signed: Irene Pap, ED Scribe. 09/18/2015. 3:39 AM.     Chief Complaint  Patient presents with  . Cough   The history is provided by the patient. No language interpreter was used.  HPI Comments: Hailey Barnes is a 54 y.o. Female with a hx of COPD, CHF, chronic bronchitis, and SOB who presents to the Emergency Department complaining of productive cough with yellow sputum and SOB onset 4 days ago. Pt reports "catching a cold" two weeks ago. She states that she was given Lasix to "get the fluid off of her legs." She says that it feels like there is more fluid building up and that there is pain over her liver. She states that she has a fatty liver. Pt reports her saliva tasting like pus, chest congestion, and hoarse voice. She denies sick contacts, sore throat, fever, chills, nausea, or vomiting. She denies hx of gallbladder problems.    Past Medical History  Diagnosis Date  . 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   Marland Kitchen Hypertension   . COPD (chronic obstructive pulmonary disease) ([place])   . Tingling in extremities 12/12/2010    Uric acid and electrolytes (02/23) normal but WBC elevated.   D/Dx: carpal tunnel, ulnar neuropathy Less likely: cervical radiculopathy, vasculitis   . CHF (congestive heart failure) ([place])   . Chronic bronchitis ([place])     "get it q yr" (02/21/2014)  . Arthritis     "knees" (02/21/2014)  . Complication of anesthesia     "I don't come out well; I chew on my tongue"  . Heart murmur   . GERD (gastroesophageal reflux disease)   . Migraine 1982-2009  . Stroke [facility]) noted on CAT 02/2014    "light", LLE weakness remains (03/30/2014)  . Chronic lower back pain   . Shortness of breath    Past Surgical History   Procedure Laterality Date  . Cesarean section  1982  . Foot surgery Bilateral     "took bones out; put pins in"  . Left and right heart catheterization with coronary angiogram N/A 03/31/2014    Procedure: LEFT AND RIGHT HEART CATHETERIZATION WITH CORONARY ANGIOGRAM;  Surgeon: Birdie Riddle, MD;  Location: [facility] CATH LAB;  Service: Cardiovascular;  Laterality: N/A;   Family History  Problem Relation Age of Onset  . Hypertension Mother   . Hypertension Father    Social History  Substance Use Topics  . Smoking status: Current Every Day Smoker -- 0.25 packs/day for 37 years    Types: Cigarettes  . Smokeless tobacco: Never Used     Comment: taking chantix  . Alcohol Use: No   OB History    Gravida Para Term Preterm AB TAB SAB Ectopic Multiple Living   1 1        1      Review of Systems 10 Systems reviewed and all are negative for acute change except as noted in the HPI.  Allergies  Review of patient's allergies indicates no known allergies.  Home Medications   Prior to Admission medications   Medication Sig Start Date End Date Taking? Authorizing Provider  albuterol (PROVENTIL HFA;VENTOLIN HFA) 108 (90 BASE) MCG/ACT inhaler Inhale 2 puffs into the lungs 2 (two) times  daily. 05/04/15  Yes Vivi Barrack, MD  aspirin 81 MG tablet Take 81 mg by mouth daily.   Yes Historical Provider, MD  carvedilol (COREG) 25 MG tablet Take 1 tablet (25 mg total) by mouth 2 (two) times daily with a meal. Patient taking differently: Take 25 mg by mouth daily.  12/05/14  Yes Leone Haven, MD  furosemide (LASIX) 40 MG tablet TAKE THREE TABLETS BY MOUTH ONCE DAILY Patient taking differently: Take 120 mg by mouth daily.  08/30/15  Yes Vivi Barrack, MD  gabapentin (NEURONTIN) 100 MG capsule Take 1 capsule (100 mg total) by mouth at bedtime. 08/30/15  Yes Vivi Barrack, MD  lisinopril (PRINIVIL,ZESTRIL) 10 MG tablet Take 1 tablet (10 mg total) by mouth daily. 08/30/15  Yes Vivi Barrack, MD   spironolactone (ALDACTONE) 25 MG tablet Take 1 tablet (25 mg total) by mouth daily. 08/30/15  Yes Vivi Barrack, MD  cyclobenzaprine (FLEXERIL) 10 MG tablet Take 1 tablet (10 mg total) by mouth 3 (three) times daily as needed for muscle spasms. Patient not taking: Reported on 06/14/2015 06/23/14   Leone Haven, MD  loratadine (CLARITIN) 10 MG tablet Take 1 tablet (10 mg total) by mouth daily. Patient not taking: Reported on 06/14/2015 08/01/14   Leone Haven, MD  metroNIDAZOLE (FLAGYL) 500 MG tablet Take 1 tablet (500 mg total) by mouth 2 (two) times daily. Patient not taking: Reported on 09/18/2015 07/20/15   Vivi Barrack, MD  omeprazole (PRILOSEC) 20 MG capsule Take 1 capsule (20 mg total) by mouth daily. Patient not taking: Reported on 07/05/2015 06/15/15   Hyman Bible, PA-C  ondansetron (ZOFRAN) 4 MG tablet Take 1 tablet (4 mg total) by mouth every 6 (six) hours. Patient not taking: Reported on 07/05/2015 06/15/15   Hyman Bible, PA-C  sertraline (ZOLOFT) 50 MG tablet Take 1 tablet (50 mg total) by mouth daily. Patient not taking: Reported on 06/14/2015 04/17/15   Leone Haven, MD   BP 189/123 mmHg  Pulse 86  Temp(Src) 98.2 F (36.8 C) (Oral)  Resp 26  Ht 5' 4"  (1.626 m)  Wt 300 lb (136.079 kg)  BMI 51.47 kg/m2  SpO2 95% Physical Exam  Constitutional: She is oriented to person, place, and time. She appears well-developed and well-nourished. No distress.  obese  HENT:  Head: Normocephalic and atraumatic.  Nose: Nose normal.  Mouth/Throat: Oropharynx is clear and moist. No oropharyngeal exudate.  Eyes: Conjunctivae and EOM are normal. Pupils are equal, round, and reactive to light. No scleral icterus.  Neck: Normal range of motion. Neck supple. No JVD present. No tracheal deviation present. No thyromegaly present.  Cardiovascular: Normal rate, regular rhythm and normal heart sounds.  Exam reveals no gallop and no friction rub.   No murmur heard. Pulmonary/Chest:  Effort normal. No respiratory distress. She has wheezes. She exhibits no tenderness.  Expiratory wheezes bilaterally  Abdominal: Soft. Bowel sounds are normal. She exhibits no distension and no mass. There is no tenderness. There is no rebound and no guarding.  Musculoskeletal: Normal range of motion. She exhibits edema. She exhibits no tenderness.  Edema to bilateral lower extremities  Lymphadenopathy:    She has no cervical adenopathy.  Neurological: She is alert and oriented to person, place, and time. No cranial nerve deficit. She exhibits normal muscle tone.  Skin: Skin is warm and dry. No rash noted. No erythema. No pallor.  Nursing note and vitals reviewed.   ED Course  Procedures (including  critical care time) DIAGNOSTIC STUDIES: Oxygen Saturation is 95% on RA, adequate by my interpretation.    COORDINATION OF CARE: 3:34 AM-Discussed treatment plan which includes chest x-ray and EKG with pt at bedside and pt agreed to plan.    Labs Review Labs Reviewed  CBC WITH DIFFERENTIAL/PLATELET - Abnormal; Notable for the following:    WBC 11.6 (*)    RBC 5.43 (*)    Hemoglobin 15.7 (*)    HCT 46.6 (*)    All other components within normal limits  COMPREHENSIVE METABOLIC PANEL - Abnormal; Notable for the following:    Glucose, Bld 138 (*)    All other components within normal limits    Imaging Review Dg Chest 2 View  09/18/2015  CLINICAL DATA:  Acute onset of cough and congestion. Hoarseness and chest tightness. Initial encounter. EXAM: CHEST  2 VIEW COMPARISON:  Chest radiograph performed 06/14/2015 FINDINGS: The lungs are well-aerated. Pulmonary vascularity is at the upper limits of normal. There is no evidence of focal opacification, pleural effusion or pneumothorax. The heart is borderline normal in size. No acute osseous abnormalities are seen. There is suggestion of mild chronic resorption at the distal clavicles bilaterally. IMPRESSION: No acute cardiopulmonary process seen.  Electronically Signed   By: Garald Balding M.D.   On: 09/18/2015 01:01   I have personally reviewed and evaluated these images and lab results as part of my medical decision-making.   EKG Interpretation   Date/Time:  Monday September 17 2015 23:31:00 EST Ventricular Rate:  96 PR Interval:  159 QRS Duration: 79 QT Interval:  371 QTC Calculation: 469 R Axis:   33 Text Interpretation:  Sinus rhythm Anterior infarct, old Baseline wander  in lead(s) I II III aVR aVF V1 V2 V3 V4 V5 V6 No significant change since  last tracing Confirmed by Glynn Octave [id number]) on 09/18/2015  3:10:01 AM      MDM   Final diagnoses:  None   Patient presents to the ED for viral URI symptoms and RUQ abd pain.  CXR neg for pneumonia but PE still reveals wheezing.  Will give another breathing treatment with prednisone and tessalon pearles for relief.  Will also obtain labs to eval for LFTs.     Labs are normal, wheezing has improved.  Will DC home with prednisone for 4 more days and PCP fu.  She appears well and in NAd.  VS remain within her normal limits and she is safe for DC.   I personally performed the services described in this documentation, which was scribed in my presence. The recorded information has been reviewed and is accurate.     Everlene Balls, MD 09/18/15 [phone]

## 2015-09-18 NOTE — Discharge Instructions (Signed)
Cough, Adult Hailey Barnes, Hailey Barnes is negative for pneumonia and you have normal blood work with Hailey liver.  Continue to take prednisone for the next 4 days, see a primary care doctor within 3days for close follow up, and use Hailey albuterol inhaler as needed.  If any symptoms worsen, come back to the ED immediately.  Thank you.  A cough helps to clear Hailey throat and lungs. A cough may last only 2-3 weeks (acute), or it may last longer than 8 weeks (chronic). Many different things can cause a cough. A cough may be a sign of an illness or another medical condition. HOME CARE  Pay attention to any changes in Hailey cough.  Take medicines only as told by Hailey doctor.  If you were prescribed an antibiotic medicine, take it as told by Hailey doctor. Do not stop taking it even if you start to feel better.  Talk with Hailey doctor before you try using a cough medicine.  Drink enough fluid to keep Hailey pee (urine) clear or pale yellow.  If the air is dry, use a cold steam vaporizer or humidifier in Hailey home.  Stay away from things that make you cough at work or at home.  If Hailey cough is worse at night, try using extra pillows to raise Hailey head up higher while you sleep.  Do not smoke, and try not to be around smoke. If you need help quitting, ask Hailey doctor.  Do not have caffeine.  Do not drink alcohol.  Rest as needed. GET HELP IF:  You have new problems (symptoms).  You cough up yellow fluid (pus).  Hailey cough does not get better after 2-3 weeks, or Hailey cough gets worse.  Medicine does not help Hailey cough and you are not sleeping well.  You have pain that gets worse or pain that is not helped with medicine.  You have a fever.  You are losing weight and you do not know why.  You have night sweats. GET HELP RIGHT AWAY IF:  You cough up blood.  You have trouble breathing.  Hailey heartbeat is very fast.   This information is not intended to replace advice given to you by Hailey  health care provider. Make sure you discuss any questions you have with Hailey health care provider.   Document Released: 06/19/2011 Document Revised: 06/27/2015 Document Reviewed: 12/13/2014 Elsevier Interactive Patient Education 2016 Elsevier Inc[employer] Bronchospasm, Adult A bronchospasm is when the tubes that carry air in and out of Hailey lungs (airways) spasm or tighten. During a bronchospasm it is hard to breathe. This is because the airways get smaller. A bronchospasm can be triggered by:  Allergies. These may be to animals, pollen, food, or mold.  Infection. This is a common cause of bronchospasm.  Exercise.  Irritants. These include pollution, cigarette smoke, strong odors, aerosol sprays, and paint fumes.  Weather changes.  Stress.  Being emotional. HOME CARE   Always have a plan for getting help. Know when to call Hailey doctor and local emergency services (911 in the [place] Know where you can get emergency care.  Only take medicines as told by Hailey doctor.  If you were prescribed an inhaler or nebulizer machine, ask Hailey doctor how to use it correctly. Always use a spacer with Hailey inhaler if you were given one.  Stay calm during an attack. Try to relax and breathe more slowly.  Control Hailey home environment:  Change Hailey heating and air conditioning filter at least  once a month.  Limit Hailey use of fireplaces and wood stoves.  Do not  smoke. Do not  allow smoking in Hailey home.  Avoid perfumes and fragrances.  Get rid of pests (such as roaches and mice) and their droppings.  Throw away plants if you see mold on them.  Keep Hailey house clean and dust free.  Replace carpet with wood, tile, or vinyl flooring. Carpet can trap dander and dust.  Use allergy-proof pillows, mattress covers, and box spring covers.  Wash bed sheets and blankets every week in hot water. Dry them in a dryer.  Use blankets that are made of polyester or cotton.  Wash hands frequently. GET HELP  IF:  You have muscle aches.  You have chest pain.  The thick spit you spit or cough up (sputum) changes from clear or white to yellow, green, gray, or bloody.  The thick spit you spit or cough up gets thicker.  There are problems that may be related to the medicine you are given such as:  A rash.  Itching.  Swelling.  Trouble breathing. GET HELP RIGHT AWAY IF:  You feel you cannot breathe or catch Hailey breath.  You cannot stop coughing.  Hailey treatment is not helping you breathe better.  You have very bad chest pain. MAKE SURE YOU:   Understand these instructions.  Will watch Hailey condition.  Will get help right away if you are not doing well or get worse.   This information is not intended to replace advice given to you by Hailey health care provider. Make sure you discuss any questions you have with Hailey health care provider.   Document Released: 08/03/2009 Document Revised: 10/27/2014 Document Reviewed: 03/29/2013 Elsevier Interactive Patient Education [employer].

## 2015-09-21 ENCOUNTER — Ambulatory Visit (INDEPENDENT_AMBULATORY_CARE_PROVIDER_SITE_OTHER): Payer: Self-pay | Admitting: Family Medicine

## 2015-09-21 ENCOUNTER — Encounter: Payer: Self-pay | Admitting: Family Medicine

## 2015-09-21 VITALS — BP 152/108 | HR 83 | Temp 98.1°F | Ht 64.0 in | Wt 301.2 lb

## 2015-09-21 DIAGNOSIS — R590 Localized enlarged lymph nodes: Secondary | ICD-10-CM | POA: Insufficient documentation

## 2015-09-21 DIAGNOSIS — I10 Essential (primary) hypertension: Secondary | ICD-10-CM

## 2015-09-21 DIAGNOSIS — R1011 Right upper quadrant pain: Secondary | ICD-10-CM

## 2015-09-21 DIAGNOSIS — R599 Enlarged lymph nodes, unspecified: Secondary | ICD-10-CM

## 2015-09-21 DIAGNOSIS — J441 Chronic obstructive pulmonary disease with (acute) exacerbation: Secondary | ICD-10-CM

## 2015-09-21 LAB — LIPASE: LIPASE: 30 U/L (ref 7–60)

## 2015-09-21 MED ORDER — ALBUTEROL SULFATE (2.5 MG/3ML) 0.083% IN NEBU
2.5000 mg | INHALATION_SOLUTION | Freq: Once | RESPIRATORY_TRACT | Status: AC
  Administered 2015-09-21: 2.5 mg via RESPIRATORY_TRACT

## 2015-09-21 MED ORDER — IPRATROPIUM BROMIDE 0.02 % IN SOLN: 0.5000 mg | Freq: Four times a day (QID) | RESPIRATORY_TRACT | Status: DC

## 2015-09-21 MED ORDER — IPRATROPIUM BROMIDE 0.02 % IN SOLN
0.5000 mg | Freq: Once | RESPIRATORY_TRACT | Status: AC
  Administered 2015-09-21: 0.5 mg via RESPIRATORY_TRACT

## 2015-09-21 MED ORDER — ALBUTEROL SULFATE (2.5 MG/3ML) 0.083% IN NEBU: 2.5000 mg | INHALATION_SOLUTION | Freq: Four times a day (QID) | RESPIRATORY_TRACT | Status: DC | PRN

## 2015-09-21 NOTE — Assessment & Plan Note (Signed)
Patient did not take her medication today. I advised compliance. Continue current BP regimen. F/U as needed.

## 2015-09-21 NOTE — Assessment & Plan Note (Signed)
Likely from previous infection. It looks benign. I recommended monitoring for now. I suggested U/S if she noticed increase in size. She will monitor for now.

## 2015-09-21 NOTE — Assessment & Plan Note (Signed)
Mild exacerbation. Likely due to medication non adherence. Albuterol atrovent given today x 1. Continue Dulera. I recommended restarting Spiriva, she stated she has some left at home and will restart but she will be unable to obtain refills due to insurance issue. 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 She stated she does not have orange card. She will benefit from applying for one. F/U with PCP in 1--2 wks.  Return precaution discussed.

## 2015-09-21 NOTE — Assessment & Plan Note (Signed)
Likely related to excessive coughing. I checked her LFT done few weeks ago, it was completely normal. I obtained Lipase today to assess for pancreatitis although unlikely. I recommended tylenol prn pain. Return precaution discussed.

## 2015-09-21 NOTE — Patient Instructions (Signed)
Smoking Cessation, Tips for Success If you are ready to quit smoking, congratulations! You have chosen to help yourself be healthier. Cigarettes bring nicotine, tar, carbon monoxide, and other irritants into your body. Your lungs, heart, and blood vessels will be able to work better without these poisons. There are many different ways to quit smoking. Nicotine gum, nicotine patches, a nicotine inhaler, or nicotine nasal spray can help with physical craving. Hypnosis, support groups, and medicines help break the habit of smoking. WHAT THINGS CAN I DO TO MAKE QUITTING EASIER?  Here are some tips to help you quit for good:  Pick a date when you will quit smoking completely. Tell all of your friends and family about your plan to quit on that date.  Do not try to slowly cut down on the number of cigarettes you are smoking. Pick a quit date and quit smoking completely starting on that day.  Throw away all cigarettes.   Clean and remove all ashtrays from your home, work, and car.  On a card, write down your reasons for quitting. Carry the card with you and read it when you get the urge to smoke.  Cleanse your body of nicotine. Drink enough water and fluids to keep your urine clear or pale yellow. Do this after quitting to flush the nicotine from your body.  Learn to predict your moods. Do not let a bad situation be your excuse to have a cigarette. Some situations in your life might tempt you into wanting a cigarette.  Never have "just one" cigarette. It leads to wanting another and another. Remind yourself of your decision to quit.  Change habits associated with smoking. If you smoked while driving or when feeling stressed, try other activities to replace smoking. Stand up when drinking your coffee. Brush your teeth after eating. Sit in a different chair when you read the paper. Avoid alcohol while trying to quit, and try to drink fewer caffeinated beverages. Alcohol and caffeine may urge you to  smoke.  Avoid foods and drinks that can trigger a desire to smoke, such as sugary or spicy foods and alcohol.  Ask people who smoke not to smoke around you.  Have something planned to do right after eating or having a cup of coffee. For example, plan to take a walk or exercise.  Try a relaxation exercise to calm you down and decrease your stress. Remember, you may be tense and nervous for the first 2 weeks after you quit, but this will pass.  Find new activities to keep your hands busy. Play with a pen, coin, or rubber band. Doodle or draw things on paper.  Brush your teeth right after eating. This will help cut down on the craving for the taste of tobacco after meals. You can also try mouthwash.   Use oral substitutes in place of cigarettes. Try using lemon drops, carrots, cinnamon sticks, or chewing gum. Keep them handy so they are available when you have the urge to smoke.  When you have the urge to smoke, try deep breathing.  Designate your home as a nonsmoking area.  If you are a heavy smoker, ask your health care provider about a prescription for nicotine chewing gum. It can ease your withdrawal from nicotine.  Reward yourself. Set aside the cigarette money you save and buy yourself something nice.  Look for support from others. Join a support group or smoking cessation program. Ask someone at home or at work to help you with your plan   to quit smoking.  Always ask yourself, "Do I need this cigarette or is this just a reflex?" Tell yourself, "Today, I choose not to smoke," or "I do not want to smoke." You are reminding yourself of your decision to quit.  Do not replace cigarette smoking with electronic cigarettes (commonly called e-cigarettes). The safety of e-cigarettes is unknown, and some may contain harmful chemicals.  If you relapse, do not give up! Plan ahead and think about what you will do the next time you get the urge to smoke. HOW WILL I FEEL WHEN I QUIT SMOKING? You  may have symptoms of withdrawal because your body is used to nicotine (the addictive substance in cigarettes). You may crave cigarettes, be irritable, feel very hungry, cough often, get headaches, or have difficulty concentrating. The withdrawal symptoms are only temporary. They are strongest when you first quit but will go away within 10-14 days. When withdrawal symptoms occur, stay in control. Think about your reasons for quitting. Remind yourself that these are signs that your body is healing and getting used to being without cigarettes. Remember that withdrawal symptoms are easier to treat than the major diseases that smoking can cause.  Even after the withdrawal is over, expect periodic urges to smoke. However, these cravings are generally short lived and will go away whether you smoke or not. Do not smoke! WHAT RESOURCES ARE AVAILABLE TO HELP ME QUIT SMOKING? Your health care provider can direct you to community resources or hospitals for support, which may include:  Group support.  Education.  Hypnosis.  Therapy.   This information is not intended to replace advice given to you by your health care provider. Make sure you discuss any questions you have with your health care provider.   Document Released: 07/04/2004 Document Revised: 10/27/2014 Document Reviewed: 03/24/2013 Elsevier Interactive Patient Education 2016 Elsevier Inc.  

## 2015-09-21 NOTE — Progress Notes (Signed)
Subjective:     Patient ID: Hailey Barnes, female   DOB: 02-06-1961, 54 y.o.   MRN: [id number]  HPI COPD: Here for ED followup for cough and sputum production for 2 wks. Sputum is yellowish in color, no blood. There is associated chest tightness and wheezing. She uses Dulera and albuterol as needed, she was supposed to be on Spiriva but due to cost she self d/c it although she still have some at home. No fever, no sick contact. She was sent home on oral steroid from the ED. RUQ pain: C/O a  2 wks hx of RUQ abdominal pain about 10/10 in severity. Sharp pain. Worsens with eating or sitting up or moving around. She feels better with lying back. Any food in her stomach makes it worse. Feel nauseous, no blood in her stool. No weight loss. She is worried there is something going on with her liver given hx of fatty liver. HTN: She did not take her BP meds today. Knot on neck: C/O small bump on the back of her neck on the right side which she noticed more than 6 months ago. Denies change in size but it can be painful once in a while. She will like to get it checked.  Current Outpatient Prescriptions on File Prior to Visit  Medication Sig Dispense Refill  . albuterol (PROVENTIL HFA;VENTOLIN HFA) 108 (90 BASE) MCG/ACT inhaler Inhale 2 puffs into the lungs 2 (two) times daily. 2 Inhaler 1  . aspirin 81 MG tablet Take 81 mg by mouth daily.    . carvedilol (COREG) 25 MG tablet Take 1 tablet (25 mg total) by mouth 2 (two) times daily with a meal. (Patient taking differently: Take 25 mg by mouth daily. ) 60 tablet 3  . furosemide (LASIX) 40 MG tablet TAKE THREE TABLETS BY MOUTH ONCE DAILY (Patient taking differently: Take 120 mg by mouth daily. ) 90 tablet 3  . lisinopril (PRINIVIL,ZESTRIL) 10 MG tablet Take 1 tablet (10 mg total) by mouth daily. 30 tablet 2  . predniSONE (DELTASONE) 20 MG tablet Take 3 tablets (60 mg total) by mouth daily. 12 tablet 0  . spironolactone (ALDACTONE) 25 MG tablet Take 1 tablet (25 mg  total) by mouth daily. 30 tablet 0  . gabapentin (NEURONTIN) 100 MG capsule Take 1 capsule (100 mg total) by mouth at bedtime. (Patient not taking: Reported on 09/21/2015) 30 capsule 3  . [DISCONTINUED] loratadine (CLARITIN) 10 MG tablet Take 1 tablet (10 mg total) by mouth daily. (Patient not taking: Reported on 06/14/2015) 30 tablet 11  . [DISCONTINUED] omeprazole (PRILOSEC) 20 MG capsule Take 1 capsule (20 mg total) by mouth daily. (Patient not taking: Reported on 07/05/2015) 30 capsule 0  . [DISCONTINUED] sertraline (ZOLOFT) 50 MG tablet Take 1 tablet (50 mg total) by mouth daily. (Patient not taking: Reported on 06/14/2015) 30 tablet 3   No current facility-administered medications on file prior to visit.   Past Medical History  Diagnosis Date  . 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   Marland Kitchen Hypertension   . COPD (chronic obstructive pulmonary disease) ([place])   . Tingling in extremities 12/12/2010    Uric acid and electrolytes (02/23) normal but WBC elevated.   D/Dx: carpal tunnel, ulnar neuropathy Less likely: cervical radiculopathy, vasculitis   . CHF (congestive heart failure) ([place])   . Chronic bronchitis ([place])     "get it q yr" (02/21/2014)  . Arthritis     "knees" (02/21/2014)  .  Complication of anesthesia     "I don't come out well; I chew on my tongue"  . Heart murmur   . GERD (gastroesophageal reflux disease)   . Migraine 1982-2009  . Stroke [facility]) noted on CAT 02/2014    "light", LLE weakness remains (03/30/2014)  . Chronic lower back pain   . Shortness of breath      Review of Systems  Respiratory: Positive for cough, chest tightness and wheezing.   Cardiovascular: Negative.   Genitourinary: Negative.   Musculoskeletal:       Knot on the back  All other systems reviewed and are negative.      Filed Vitals:   09/21/15 1052 09/21/15 1131  BP: 180/118 152/108  Pulse: 83   Temp: 98.1 F (36.7 C)   TempSrc: Oral   Height: 5' 4"  (1.626 m)     Weight: 301 lb 4 oz (136.646 kg)   SpO2: 99%     Objective:   Physical Exam  Constitutional: She is oriented to person, place, and time. She appears well-developed. No distress.  Cardiovascular: Normal rate, regular rhythm, normal heart sounds and intact distal pulses.   No murmur heard. Pulmonary/Chest: Effort normal. No respiratory distress. She has wheezes.  Abdominal: Soft. Bowel sounds are normal. She exhibits no distension. There is no hepatosplenomegaly. There is tenderness in the right upper quadrant. There is no rigidity, no rebound, no guarding, no tenderness at McBurney's point and negative Murphy's sign.  Mild RUQ tenderness, no hepatosplenomegaly.  Musculoskeletal: Normal range of motion. She exhibits no edema.  Neurological: She is alert and oriented to person, place, and time.  Skin:     Nursing note and vitals reviewed.      Assessment:     COPD RUQ abdominal pain HTN Lymphadenopathy ( Occipital0     Plan:     Check problem list.

## 2015-09-24 ENCOUNTER — Telehealth: Payer: Self-pay | Admitting: *Deleted

## 2015-09-24 NOTE — Telephone Encounter (Signed)
Spoke with patient and she is still having a lot of abdominal pain.  She did make an appt to see Dr. [name] on 09/26/15. Kirbie Stodghill,CMA

## 2015-09-24 NOTE — Telephone Encounter (Signed)
-----   Message from Kinnie Feil, MD sent at 09/24/2015  8:37 AM EST ----- Please inform patient her pancrease test was normal. Advise her to follow up with her PCP if she is still experiencing belly pain.

## 2015-09-26 ENCOUNTER — Ambulatory Visit (INDEPENDENT_AMBULATORY_CARE_PROVIDER_SITE_OTHER): Payer: Self-pay | Admitting: Family Medicine

## 2015-09-26 ENCOUNTER — Encounter: Payer: Self-pay | Admitting: Family Medicine

## 2015-09-26 VITALS — BP 177/109 | HR 86 | Temp 98.4°F | Wt 288.9 lb

## 2015-09-26 DIAGNOSIS — I509 Heart failure, unspecified: Secondary | ICD-10-CM

## 2015-09-26 DIAGNOSIS — R0602 Shortness of breath: Secondary | ICD-10-CM | POA: Insufficient documentation

## 2015-09-26 DIAGNOSIS — R1011 Right upper quadrant pain: Secondary | ICD-10-CM

## 2015-09-26 LAB — BASIC METABOLIC PANEL WITH GFR
BUN: 13 mg/dL (ref 7–25)
CALCIUM: 9.7 mg/dL (ref 8.6–10.4)
CO2: 25 mmol/L (ref 20–31)
Chloride: 100 mmol/L (ref 98–110)
Creat: 1.02 mg/dL (ref 0.50–1.05)
GFR, EST AFRICAN AMERICAN: 73 mL/min (ref >=60)
GFR, Est Non African American: 63 mL/min (ref >=60)
GLUCOSE: 127 mg/dL — AB (ref 65–99)
Potassium: 3.9 mmol/L (ref 3.5–5.3)
Sodium: 134 mmol/L — ABNORMAL LOW (ref 135–146)

## 2015-09-26 LAB — POCT H PYLORI SCREEN: H Pylori Screen, POC: POSITIVE

## 2015-09-26 MED ORDER — FUROSEMIDE 10 MG/ML IJ SOLN
40.0000 mg | Freq: Once | INTRAMUSCULAR | Status: AC
  Administered 2015-09-26: 40 mg via INTRAMUSCULAR

## 2015-09-26 MED ORDER — CLARITHROMYCIN 500 MG PO TABS: 500.0000 mg | ORAL_TABLET | Freq: Two times a day (BID) | ORAL | Status: DC

## 2015-09-26 MED ORDER — FUROSEMIDE 10 MG/ML IJ SOLN: 80.0000 mg | Freq: Once | INTRAMUSCULAR | Status: DC

## 2015-09-26 MED ORDER — FUROSEMIDE 10 MG/ML IJ SOLN
40.0000 mg | Freq: Once | INTRAMUSCULAR | Status: AC
Start: 2015-09-26 — End: 2015-09-26
  Administered 2015-09-26: 40 mg via INTRAMUSCULAR

## 2015-09-26 MED ORDER — AMOXICILLIN 500 MG PO CAPS: 1000.0000 mg | ORAL_CAPSULE | Freq: Two times a day (BID) | ORAL | Status: DC

## 2015-09-26 MED ORDER — PANTOPRAZOLE SODIUM 40 MG PO TBEC: 40.0000 mg | DELAYED_RELEASE_TABLET | Freq: Every day | ORAL | Status: DC

## 2015-09-26 MED ORDER — PREDNISONE 50 MG PO TABS: 50.0000 mg | ORAL_TABLET | Freq: Every day | ORAL | Status: DC

## 2015-09-26 NOTE — Assessment & Plan Note (Signed)
A: Patient with mixed picture for shortness of breath. Likely has a COPD component given her 2 week history of wheezing, cough, and increased sputum production. Patient also likely has mild volume overload given her significant LE pitting edema, orthopnea, and bibasilar crackles. Doubt ACS given prolonged course and lack of chest pain. Doubt PE given normal HR, though possible. Patient with mildly increased work of breathing, however maintaining saturations at 95% on room air.   P: Will give 28m of IM lasix today. Increase her home dose of lasix to 1614mfor the next 3-4 days. Will check BMP and BNP (to help delineate CHF vs COPD component). Will also give 5 days of prednisone to treat potential COPD component. Clarithromycin given for H pylori will also treat COPD exacerbation. Strict return precautions given. Instructed patient to return in 2-3 days if not improving. If not improving, can consider work up for PE including D-dimer vs imaging. After this acute exacerbation, patient will likely need optimization of medical management including PFTs, optimization of COPD medications, and titration of diuretic therapy.

## 2015-09-26 NOTE — Assessment & Plan Note (Addendum)
POC H pylori testing in office today positive. Will treat with protonix and 2 weeks of amoxicillin and clarithromycin. If continues to have problems, may need repeat US, though abdominal US in September negative for gallstones. Patient will need test of cure with urease breath test in approximately 6 weeks (4 weeks after stopping therapy.)

## 2015-09-26 NOTE — Progress Notes (Signed)
Subjective:  Hailey Barnes is a 54 y.o. female who presents to the [facility] today with a chief complaint of RUQ pain and SOB follow up.   HPI:  RUQ abdominal pain Patient was seen in the clinic 5 days ago for the same complaint. Says that she has intermittent abdominal pain located in her RUQ only after eating. Pain has been present for the past 2 weeks. Described as a sharp pain. Has not tried any medications. Some nausea with occasional nonbloody nonbilious emesis. No diarrhea or constipation. Las BM yesterday. No fevers or chills.    SOB Patient also seen 5 days ago for shortness of breath. States that she has had increased cough and sputum production for the past 2 weeks. At her last clinic visit, patient was noted to not be using her spiriva as prescribed.  Since then, she reports that she has been taking her dulera and spiriva everyday without significant improvement. No recent sick contacts. No fevers or chills. Feels tight in her chest, but no chest pain. Patient reports increased lower extremity edema and increased orthopnea. Patient is currently taking lasix 134m daily and does not feel like it makes her urinate very much. Has previously tried lasix 1656mdaily which makes her urinate.   ROS: Per HPI  PMH:  The following were reviewed and entered/updated in epic: Past Medical History  Diagnosis Date  . 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   . Marland Kitchenypertension   . COPD (chronic obstructive pulmonary disease) ([place]  . Tingling in extremities 12/12/2010    Uric acid and electrolytes (02/23) normal but WBC elevated.   D/Dx: carpal tunnel, ulnar neuropathy Less likely: cervical radiculopathy, vasculitis   . CHF (congestive heart failure) ([place]  . Chronic bronchitis ([place]    "get it q yr" (02/21/2014)  . Arthritis     "knees" (02/21/2014)  . Complication of anesthesia     "I don't come out well; I chew on my tongue"  . Heart murmur   . GERD  (gastroesophageal reflux disease)   . Migraine 1982-2009  . [facility]    "light", LLE weakness remains (03/30/2014)  . Chronic lower back pain   . Shortness of breath    Patient Active Problem List   Diagnosis Date Noted  . Shortness of breath 09/26/2015  . RUQ abdominal pain 09/21/2015  . Occipital lymphadenopathy 09/21/2015  . Hydrosalpinx 07/05/2015  . Depression 04/19/2015  . Abnormal uterine bleeding 12/07/2014  . Allergic rhinitis 08/01/2014  . Rash and nonspecific skin eruption 08/01/2014  . Eczema 08/01/2014  . Diminished vision 06/23/2014  . Chronic diastolic CHF (congestive heart failure) ([place]08/25/2015  . Restrictive lung disease 05/10/2014  . Chest pain at rest 03/30/2014  . NASH (nonalcoholic steatohepatitis) 01/04/2014  . Diastolic dysfunction 0277/82/4235. Hypertension 05/26/2010  . OBESITY, NOS 12/17/2006  . TOBACCO DEPENDENCE 12/17/2006   Past Surgical History  Procedure Laterality Date  . Cesarean section  1982  . Foot surgery Bilateral     "took bones out; put pins in"  . Left and right heart catheterization with coronary angiogram N/A 03/31/2014    Procedure: LEFT AND RIGHT HEART CATHETERIZATION WITH CORONARY ANGIOGRAM;  Surgeon: AjBirdie RiddleMD;  [facility];  Service: Cardiovascular;  Laterality: N/A;     Objective:  Physical Exam: BP 177/109 mmHg  Pulse 86  Temp(Src) 98.4 F (36.9 C) (Oral)  Wt 288 lb 14.4 oz (131.044 kg)  SpO2 95%  LMP 08/31/2015  Gen: NAD, resting comfortably CV: RRR with no murmurs appreciated, No JVD noted Lungs: Increased work of breathing. No wheezes, though has faint bibasilar crackles, GI: Normal bowel sounds present. Mild RUQ tenderness. No rebound or guarding.  MSK: 2+ pitting edema to knees bilaterally Skin: warm, dry Neuro: grossly normal, moves all extremities Psych: Normal affect and thought content  Results for orders placed or performed in visit on 09/26/15 (from the past  72 hour(s))  H.pylori screen, POC     Status: Abnormal   Collection Time: 09/26/15  2:56 PM  Result Value Ref Range   H Pylori Screen, POC POSITIVE      Assessment/Plan:  RUQ abdominal pain POC H pylori testing in office today positive. Will treat with protonix and 2 weeks of amoxicillin and clarithromycin. If continues to have problems, may need repeat US, though abdominal US in September negative for gallstones. Patient will need test of cure with urease breath test in approximately 6 weeks (4 weeks after stopping therapy.)  Shortness of breath A: Patient with mixed picture for shortness of breath. Likely has a COPD component given her 2 week history of wheezing, cough, and increased sputum production. Patient also likely has mild volume overload given her significant LE pitting edema, orthopnea, and bibasilar crackles. Doubt ACS given prolonged course and lack of chest pain. Doubt PE given normal HR, though possible. Patient with mildly increased work of breathing, however maintaining saturations at 95% on room air.   P: Will give 50m of IM lasix today. Increase her home dose of lasix to 1610mfor the next 3-4 days. Will check BMP and BNP (to help delineate CHF vs COPD component). Will also give 5 days of prednisone to treat potential COPD component. Clarithromycin given for H pylori will also treat COPD exacerbation. Strict return precautions given. Instructed patient to return in 2-3 days if not improving. If not improving, can consider work up for PE including D-dimer vs imaging. After this acute exacerbation, patient will likely need optimization of medical management including PFTs, optimization of COPD medications, and titration of diuretic therapy.    I spent 25 minutes on this patient encounter, over-half of which were spent on face-to-face counseling.  CaAlgis GreenhousePaJerline PainMDMarengoesident PGY-2 09/26/2015 4:15 PM

## 2015-09-26 NOTE — Patient Instructions (Signed)
For your breathing, we will give you another course of steroids. Please take your inhalers exactly and prescribed. Please also increase your lasix to 128m daily for the next 3 days.  You have a bacterial in your stomach called H pylori. This may be whats causing your abdominal pain. We will treat you with 2 antibiotics and an acid reducer medication for the next 2 weeks.  Please come back in 2-3 days if you are not feeling better, otherwise come back in 1-2 weeks for follow up on your fluid and your abdominal pain.   Take care,  Dr PJerline Pain

## 2015-09-27 ENCOUNTER — Telehealth: Payer: Self-pay | Admitting: Family Medicine

## 2015-09-27 ENCOUNTER — Encounter: Payer: Self-pay | Admitting: Family Medicine

## 2015-09-27 LAB — BRAIN NATRIURETIC PEPTIDE: Brain Natriuretic Peptide: 6.7 pg/mL (ref 0.0–100.0)

## 2015-09-27 NOTE — Telephone Encounter (Signed)
Will forward to Dr. [name] to see if there is something cheaper that patient can get in its place. Jazmin Hartsell,CMA

## 2015-09-27 NOTE — Telephone Encounter (Signed)
Pt called because the medication Biaxin that was prescribed is over 100.00 dollars. She wanted to know if there is something cheaper that can be called in. Please let patient know. jw

## 2015-09-27 NOTE — Telephone Encounter (Signed)
Will give coupon card to reduce cost of clarithromycin. If patient still cannot afford, can consider alternatively treating with doxycycline or levaquin.  [name]. [name], [facility] Resident PGY-2 09/27/2015 5:41 PM

## 2015-09-28 NOTE — Telephone Encounter (Signed)
Patient agreed to using the coupon card.  She picked this up before clinic was closed. Jazmin Hartsell,CMA

## 2015-10-01 ENCOUNTER — Telehealth: Payer: Self-pay | Admitting: *Deleted

## 2015-10-01 NOTE — Telephone Encounter (Signed)
Patient verified DOB Patient needs to speak about signing up for insurance on [email or web address]. Patient states she becomes disconnected on the phone line when she attempts to complete the verbal application with the automated system. Patient states the deadline is December 15. Patient is needing assistance prior to the deadline. Please follow-up with the patient as soon as possible.

## 2015-10-09 ENCOUNTER — Telehealth: Payer: Self-pay | Admitting: Family Medicine

## 2015-10-09 MED ORDER — FLUCONAZOLE 150 MG PO TABS: 150.0000 mg | ORAL_TABLET | Freq: Once | ORAL | Status: DC

## 2015-10-09 NOTE — Telephone Encounter (Signed)
Rx for diflucan sent in. Patient should take 1 pill today and an additional pill in 3 days if symptoms are not improving. If her symptoms do not improve with treatment, she needs to come in for evaluation.  [name]. [name], [place] Resident PGY-2 10/09/2015 10:50 AM

## 2015-10-09 NOTE — Telephone Encounter (Signed)
Spoke with patient she is aware of this.  She states that she has to make a follow up appt soon anyway and will inform us then if symptoms aren't improved. Malene Blaydes,CMA

## 2015-10-09 NOTE — Telephone Encounter (Signed)
Will forward to Dr. [name].  Jazmin Hartsell,CMA

## 2015-10-09 NOTE — Telephone Encounter (Signed)
Pt called because she is taking a lot of antibiotics and now she has a yeast infection and would like to see if we can call something in for this,. jw

## 2015-11-02 ENCOUNTER — Other Ambulatory Visit: Payer: Self-pay | Admitting: Family Medicine

## 2015-11-02 NOTE — Telephone Encounter (Signed)
Rx filled.  [name]. [name], [place] [facility] Resident PGY-2 11/02/2015 8:21 PM

## 2015-11-02 NOTE — Telephone Encounter (Signed)
No longer patient of Dr. Caryl Bis.

## 2015-11-22 ENCOUNTER — Encounter: Payer: Self-pay | Admitting: Family Medicine

## 2015-11-22 ENCOUNTER — Ambulatory Visit (HOSPITAL_COMMUNITY)
Admission: RE | Admit: 2015-11-22 | Discharge: 2015-11-22 | Disposition: A | Discharge status: Home / Self Care | Payer: BLUE CROSS/BLUE SHIELD | Source: Ambulatory Visit | Attending: Family Medicine | Admitting: Family Medicine

## 2015-11-22 ENCOUNTER — Ambulatory Visit (INDEPENDENT_AMBULATORY_CARE_PROVIDER_SITE_OTHER): Payer: BLUE CROSS/BLUE SHIELD | Admitting: Family Medicine

## 2015-11-22 VITALS — BP 140/103 | HR 84 | Temp 97.7°F | Ht 64.0 in | Wt 288.9 lb

## 2015-11-22 DIAGNOSIS — I517 Cardiomegaly: Secondary | ICD-10-CM | POA: Diagnosis not present

## 2015-11-22 DIAGNOSIS — R739 Hyperglycemia, unspecified: Secondary | ICD-10-CM | POA: Insufficient documentation

## 2015-11-22 DIAGNOSIS — R5383 Other fatigue: Secondary | ICD-10-CM | POA: Diagnosis not present

## 2015-11-22 DIAGNOSIS — R079 Chest pain, unspecified: Secondary | ICD-10-CM

## 2015-11-22 DIAGNOSIS — R319 Hematuria, unspecified: Secondary | ICD-10-CM | POA: Diagnosis not present

## 2015-11-22 DIAGNOSIS — L298 Other pruritus: Secondary | ICD-10-CM | POA: Diagnosis not present

## 2015-11-22 DIAGNOSIS — I10 Essential (primary) hypertension: Secondary | ICD-10-CM | POA: Diagnosis not present

## 2015-11-22 DIAGNOSIS — A499 Bacterial infection, unspecified: Secondary | ICD-10-CM

## 2015-11-22 DIAGNOSIS — R9431 Abnormal electrocardiogram [ECG] [EKG]: Secondary | ICD-10-CM | POA: Insufficient documentation

## 2015-11-22 DIAGNOSIS — B9689 Other specified bacterial agents as the cause of diseases classified elsewhere: Secondary | ICD-10-CM | POA: Insufficient documentation

## 2015-11-22 DIAGNOSIS — N760 Acute vaginitis: Secondary | ICD-10-CM

## 2015-11-22 DIAGNOSIS — N898 Other specified noninflammatory disorders of vagina: Secondary | ICD-10-CM

## 2015-11-22 LAB — CBC
HCT: 51.6 % — ABNORMAL HIGH (ref 36.0–46.0)
Hemoglobin: 17.6 g/dL — ABNORMAL HIGH (ref 12.0–15.0)
MCH: 28.5 pg (ref 26.0–34.0)
MCHC: 34.1 g/dL (ref 30.0–36.0)
MCV: 83.5 fL (ref 78.0–100.0)
MPV: 11.1 fL (ref 8.6–12.4)
PLATELETS: 221 10*3/uL (ref 150–400)
RBC: 6.18 MIL/uL — AB (ref 3.87–5.11)
RDW: 14.1 % (ref 11.5–15.5)
WBC: 9.4 10*3/uL (ref 4.0–10.5)

## 2015-11-22 LAB — POCT URINALYSIS DIPSTICK
BILIRUBIN UA: NEGATIVE
Glucose, UA: NEGATIVE
Ketones, UA: NEGATIVE
NITRITE UA: NEGATIVE
PH UA: 7
PROTEIN UA: NEGATIVE
Spec Grav, UA: 1.015
UROBILINOGEN UA: 0.2

## 2015-11-22 LAB — POCT UA - MICROSCOPIC ONLY

## 2015-11-22 LAB — POCT WET PREP (WET MOUNT): Clue Cells Wet Prep Whiff POC: POSITIVE

## 2015-11-22 LAB — BASIC METABOLIC PANEL WITH GFR
BUN: 11 mg/dL (ref 7–25)
CHLORIDE: 101 mmol/L (ref 98–110)
CO2: 26 mmol/L (ref 20–31)
Calcium: 9.1 mg/dL (ref 8.6–10.4)
Creat: 1.04 mg/dL (ref 0.50–1.05)
GFR, EST AFRICAN AMERICAN: 71 mL/min (ref >=60)
GFR, EST NON AFRICAN AMERICAN: 62 mL/min (ref >=60)
Glucose, Bld: 113 mg/dL — ABNORMAL HIGH (ref 65–99)
POTASSIUM: 3.8 mmol/L (ref 3.5–5.3)
Sodium: 137 mmol/L (ref 135–146)

## 2015-11-22 LAB — POCT GLYCOSYLATED HEMOGLOBIN (HGB A1C): HEMOGLOBIN A1C: 7.2

## 2015-11-22 MED ORDER — LISINOPRIL 10 MG PO TABS: 20.0000 mg | ORAL_TABLET | Freq: Every day | ORAL | Status: DC

## 2015-11-22 MED ORDER — METRONIDAZOLE 500 MG PO TABS: 500.0000 mg | ORAL_TABLET | Freq: Two times a day (BID) | ORAL | Status: DC

## 2015-11-22 NOTE — Patient Instructions (Addendum)
It was a pleasure seeing you today in our clinic. Today we discussed your vaginal discharge and chest pain. Here is the treatment plan we have discussed and agreed upon together:   Vaginal discharge - I placed an order for metronidazole. Take this twice a day for the next 7 days. - To help prevent future yeast infection purchase some yogurt or cottage cheese at your local grocery store. Consumes 2 servings a day while on this medication and to servings a day for the following 3 days after completing your antibiotic course.  Chest pain - I have placed a referral to cardiology. You will be contacted in order to set up an appointment. - I would like for you to increase her Lasix dose from 120 mg daily to 160 mg daily for the next 4 days. - Try to reduce the amount of salt intake you ingest and try to reduce the amount of drink that you ingest as well. - I've increased your lisinopril from 10 mg to 20 mg. WAIT 4 DAYS UNTIL YOU GO BACK TO YOUR ORIGINAL Lasix DOSE TO MAKE THIS CHANGE. Take 2 tablets once a day for this. - If you experience any severe shortness of breath, chest pain, dizziness, fainting, left arm or jaw pain then I would like for you to report to the emergency department immediately. - Please follow-up with Dr. [name] in 2 weeks.

## 2015-11-22 NOTE — Assessment & Plan Note (Signed)
Patient is currently complaining of chest pain. This is been ongoing for about 7 days. Lasts around 10 minutes each time. Pain is described as tightness and dull. No radiation to left arm or neck. Associated fatigue. Some association with activity. EKG unchanged from previous. Blood pressure elevated today. - I have placed a referral to cardiology. - I've asked patient to increase her Lasix dose to 160 mg daily over the next 4 days. - I've increased patient's lisinopril dose from 10 mg to 20 mg. She is to initiate this in 4 days when she returns to her original Lasix dosing. - BMP obtained today to assess electrolytes - BNP obtained today to assess for heart failure - CBC to assess hemoglobin levels due to patient's reported fatigue. - EKG in clinic today was relatively unchanged from her previous - Follow-up with PCP in 2 weeks. He need a repeat BMP at that time.

## 2015-11-22 NOTE — Assessment & Plan Note (Signed)
Patient's most recent BMP yielded hyperglycemia to 127. I am unsure whether or not this was a fasting level or she had recently eaten. However, patient is complaining of increased vaginal infections over the past 6 months. Patient is also morbidly obese. Patient is at a high risk for developing diabetes, type II. Urine dipstick was negative for glucose of this time. - Obtaining hemoglobin A1c today  - If patient tests within the diabetic range this could be the cause behind her frequent vaginal infections due to changes in her vaginal chemistry as well as exposure to glucose from the urine. - Other causes of increased frequency of vaginal infections could be onset of menopause. Patient is 55 years old. It is not uncommon to have patient's vaginal environment/chemistry change after estrogen level changes from menopause.

## 2015-11-22 NOTE — Assessment & Plan Note (Signed)
Patient is here reporting signs and symptoms suggestive of bacterial vaginosis. Vaginal swab was obtained which showed evidence of BV. (Swab collected via patient self collection) - Metronidazole 500 mg twice a day 7 days. - Encouraged yogurt/cottage cheese ingestion during the antibiotic course and for 3 days after completion.

## 2015-11-22 NOTE — Progress Notes (Signed)
CHEST PAIN Left sided, dull, feels like a knot in the middle of her chest. Some "tightness". Feels like BP is high, had nose bleed, lasted for ~10 minutes. Having increased fatigue. CP waxing and waning for ~1 week. CP made slightly worse w/ little activity (cleaning house), but also occurs while at rest. CP will last 5-10 minutes.   Chest pain began ~7 days ago. Pain is: tight, dull, intermittent How severe is the pain: dull What worsens the pain: activity What makes the pain better: nothing Radiation: behind breasts  PMH History of blood clot or heart problems or aneurysms: CHF (diastolic)  Symptoms Nausea/vomiting: occasional mild nausea Shortness of breath: yes Pleuritic pain: no Cough: no Swelling of legs: yes Syncope: no Heart burn or food sticking: no Immobility: no, but very low activity  VAGINAL DISCHARGE Started w/ itching, mostly near vaginal os but occasionally "pricking" sensation. Began 2 weeks ago.  Having vaginal discharge for 14 days. Discharge is: light brown, thin Sex in last month: yes; last time was ~3wks ago Using barrier protection (condoms): No Possible STD exposure: no Personal history of vaginal infection: yeast, BV Family history of uterine or vaginal cancer: no Recent antibiotic use: amox and clarithro on 09/25/16  Symptoms Vaginal itching: yes Dysuria: no Dyspareunia: yes Genital sores or ulcers: no Hematuria: no Flank pain: no Weight loss: no Weight gain: yes Trouble with vision: yes Headaches: no Abdomen or pelvic pain: no Back pain: no   ROS see HPI Smoking Status noted: Smoker   Objective: BP 140/103 mmHg  Pulse 84  Temp(Src) 97.7 F (36.5 C) (Oral)  Ht 5' 4"  (1.626 m)  Wt 288 lb 14.4 oz (131.044 kg)  BMI 49.57 kg/m2 Gen: NAD, alert, cooperative, and pleasant. CV: RRR, no murmur, distant heart sounds, no JVD, no bruits appreciated Resp: CTAB, no wheezes, non-labored, very faint bibasilar crackles Abd: Morbidly obese,  SNTND, BS present, no guarding or organomegaly, no CVA tenderness, no suprapubic tenderness. Ext: +1-2 pitting edema to bilateral tibial tuberosity, pulses intact throughout, no calf tenderness  EKG: Normal sinus rhythm, evidence of left axis deviation, atrial hypertrophy. No evidence of ischemic changes. EKG relatively unchanged from previous.   Assessment and plan:  Bacterial vaginosis Patient is here reporting signs and symptoms suggestive of bacterial vaginosis. Vaginal swab was obtained which showed evidence of BV. (Swab collected via patient self collection) - Metronidazole 500 mg twice a day 7 days. - Encouraged yogurt/cottage cheese ingestion during the antibiotic course and for 3 days after completion.  Chest pain at rest Patient is currently complaining of chest pain. This is been ongoing for about 7 days. Lasts around 10 minutes each time. Pain is described as tightness and dull. No radiation to left arm or neck. Associated fatigue. Some association with activity. EKG unchanged from previous. Blood pressure elevated today. - I have placed a referral to cardiology. - I've asked patient to increase her Lasix dose to 160 mg daily over the next 4 days. - I've increased patient's lisinopril dose from 10 mg to 20 mg. She is to initiate this in 4 days when she returns to her original Lasix dosing. - BMP obtained today to assess electrolytes - BNP obtained today to assess for heart failure - CBC to assess hemoglobin levels due to patient's reported fatigue. - EKG in clinic today was relatively unchanged from her previous - Follow-up with PCP in 2 weeks. He need a repeat BMP at that time.  Hyperglycemia Patient's most recent BMP yielded hyperglycemia to  127. I am unsure whether or not this was a fasting level or she had recently eaten. However, patient is complaining of increased vaginal infections over the past 6 months. Patient is also morbidly obese. Patient is at a high risk for  developing diabetes, type II. Urine dipstick was negative for glucose of this time. - Obtaining hemoglobin A1c today  - If patient tests within the diabetic range this could be the cause behind her frequent vaginal infections due to changes in her vaginal chemistry as well as exposure to glucose from the urine. - Other causes of increased frequency of vaginal infections could be onset of menopause. Patient is 55 years old. It is not uncommon to have patient's vaginal environment/chemistry change after estrogen level changes from menopause.    Orders Placed This Encounter  Procedures  . BASIC METABOLIC PANEL WITH GFR  . Brain natriuretic peptide  . CBC  . Ambulatory referral to Cardiology    Referral Priority:  Routine    Referral Type:  Consultation    Referral Reason:  Specialty Services Required    Requested Specialty:  Cardiology    Number of Visits Requested:  1  . POCT Wet Prep [employer])  . POCT glycosylated hemoglobin (Hb A1C)  . Urinalysis Dipstick  . POCT UA - Microscopic Only  . EKG 12-Lead    Meds ordered this encounter  Medications  . metroNIDAZOLE (FLAGYL) 500 MG tablet    Sig: Take 1 tablet (500 mg total) by mouth 2 (two) times daily.    Dispense:  14 tablet    Refill:  0  . lisinopril (PRINIVIL,ZESTRIL) 10 MG tablet    Sig: Take 2 tablets (20 mg total) by mouth daily.    Dispense:  30 tablet    Refill:  2     Elberta Leatherwood, MD,MS,  PGY2 11/22/2015 5:40 PM

## 2015-11-23 LAB — BRAIN NATRIURETIC PEPTIDE: BRAIN NATRIURETIC PEPTIDE: 4.4 pg/mL (ref 0.0–100.0)

## 2015-11-29 ENCOUNTER — Telehealth: Payer: Self-pay | Admitting: *Deleted

## 2015-11-29 NOTE — Telephone Encounter (Signed)
Called patient to offer flu vaccine. Patient states she needs to schedule 2 week f/u with Dr. [name] and will get flu shot at that time. Appt made for 12/07/15 at 3 PM DUCATTE, Orvis Brill, RN

## 2015-12-07 ENCOUNTER — Encounter: Payer: Self-pay | Admitting: Family Medicine

## 2015-12-07 ENCOUNTER — Ambulatory Visit (INDEPENDENT_AMBULATORY_CARE_PROVIDER_SITE_OTHER): Payer: BLUE CROSS/BLUE SHIELD | Admitting: Family Medicine

## 2015-12-07 VITALS — BP 141/91 | HR 90 | Temp 97.5°F | Ht 64.0 in | Wt 293.8 lb

## 2015-12-07 DIAGNOSIS — Z23 Encounter for immunization: Secondary | ICD-10-CM | POA: Diagnosis not present

## 2015-12-07 DIAGNOSIS — L298 Other pruritus: Secondary | ICD-10-CM | POA: Diagnosis not present

## 2015-12-07 DIAGNOSIS — N898 Other specified noninflammatory disorders of vagina: Secondary | ICD-10-CM

## 2015-12-07 DIAGNOSIS — E119 Type 2 diabetes mellitus without complications: Secondary | ICD-10-CM | POA: Insufficient documentation

## 2015-12-07 DIAGNOSIS — A048 Other specified bacterial intestinal infections: Secondary | ICD-10-CM

## 2015-12-07 LAB — POCT URINALYSIS DIPSTICK
BILIRUBIN UA: NEGATIVE
Blood, UA: NEGATIVE
Glucose, UA: NEGATIVE
KETONES UA: NEGATIVE
LEUKOCYTES UA: NEGATIVE
Nitrite, UA: NEGATIVE
PH UA: 7
Protein, UA: NEGATIVE
SPEC GRAV UA: 1.015
Urobilinogen, UA: 0.2

## 2015-12-07 LAB — POCT WET PREP (WET MOUNT): Clue Cells Wet Prep Whiff POC: POSITIVE

## 2015-12-07 MED ORDER — METFORMIN HCL 500 MG PO TABS: 500.0000 mg | ORAL_TABLET | Freq: Two times a day (BID) | ORAL | Status: DC

## 2015-12-07 MED ORDER — METRONIDAZOLE 500 MG PO TABS: 500.0000 mg | ORAL_TABLET | Freq: Two times a day (BID) | ORAL | Status: DC

## 2015-12-07 NOTE — Assessment & Plan Note (Signed)
Much improved today, though still mildly persistent. Patient not able to have urease breath test today for test of cure due to recent course of flagyl. Instructed patient to finish course of flagyl and return in 2-3 weeks after treatment for test of cure.

## 2015-12-07 NOTE — Progress Notes (Signed)
Subjective:  Hailey Barnes is a 55 y.o. female who presents to the [facility] today with a chief complaint of vaginal irritation.   HPI:  Vaginal Irritation Treated for BV and yeast infection 2 weeks ago. Improved some but is still having mild vaginal irritation. Small amount of discharge. No fevers or chills. No lower abdominal pain.  Abdominal Pain / H pylori Patient diagnosed with H pylori infection and treated with triple therapy 3 months ago. Abdominal pain has significantly improved but is still there. Patient reports finishing her course of clarithromycin and amoxicillin, but reports never receiving protonix. Pain is epigastric and is a little worse after eating.   Diabetes Patient with A1c of 7.2 at office visit 2 weeks ago. Is not currently on any medications. Drinks lots of sweetened beverages. Does not exercise regularly. No polyuria or polydipsia.   ROS: Per HPI  PMH: Smoking history reviewed.    Objective:  Physical Exam: BP 141/91 mmHg  Pulse 90  Temp(Src) 97.5 F (36.4 C) (Oral)  Ht 5' 4"  (1.626 m)  Wt 293 lb 12.8 oz (133.267 kg)  BMI 50.41 kg/m2  LMP 08/31/2015  Gen: NAD, resting comfortably CV: RRR with no murmurs appreciated Pulm: NWOB, CTAB with no crackles, wheezes, or rhonchi GI: Normal bowel sounds present. Soft, Nontender, Nondistended. GU: Normal external female genitalia. Scant amount of white discharge noted in vaginal vault MSK: no edema, cyanosis, or clubbing noted Skin: warm, dry Neuro: grossly normal, moves all extremities Psych: Normal affect and thought content  Results for orders placed or performed in visit on 12/07/15 (from the past 72 hour(s))  POCT urinalysis dipstick     Status: None   Collection Time: 12/07/15  3:25 PM  Result Value Ref Range   Color, UA YELLOW    Clarity, UA CLEAR    Glucose, UA NEG    Bilirubin, UA NEG    Ketones, UA NEG    Spec Grav, UA 1.015    Blood, UA NEG    pH, UA 7.0    Protein, UA NEG    Urobilinogen,  UA 0.2    Nitrite, UA NEG    Leukocytes, UA Negative Negative  POCT Wet Prep [facility])     Status: Abnormal   Collection Time: 12/07/15  4:00 PM  Result Value Ref Range   Source Wet Prep POC VAG    WBC, Wet Prep HPF POC NONE    Bacteria Wet Prep HPF POC Many (A) None, Few, Too numerous to count   Clue Cells Wet Prep HPF POC Many (A) None, Too numerous to count   Clue Cells Wet Prep Whiff POC Positive Whiff    Yeast Wet Prep HPF POC None    Trichomonas Wet Prep HPF POC NONE    Assessment/Plan:  Bacterial vaginosis Wet prep consistent with BV. Will treat with flagyl. Also recommended femdophilus probiotics. Will also treat diabetes with metformin which will likely improve symptoms. If continues to have recurrent symptoms despite above management, would consider trial of intravaginal metronidazole gel.   RUQ abdominal pain Much improved today, though still mildly persistent. Patient not able to have urease breath test today for test of cure due to recent course of flagyl. Instructed patient to finish course of flagyl and return in 2-3 weeks after treatment for test of cure.   T2DM (type 2 diabetes mellitus) ([place]) New diagnosis. Will start metformin today. Discussed lifestyle modifications. Will refer to diabetes education. Patient already on aspirin and Ace inhibitor. Patient  is due to have lipid panel.   Follow up in 3-4 weeks. Will likely increase metformin dose at that time if patient tolerates. Will discuss preventative measures including annual eye exam and foot exam at that time.   [name]. [name], [name] [facility] Resident PGY-2 12/07/2015 5:19 PM

## 2015-12-07 NOTE — Patient Instructions (Signed)
   You have BV. We will treat with a course of flagyl. Please also try using the probiotics listed above.  We will start you on metformin for your diabetes. This can cause diarrhea.   Please come back 2 weeks after finishing your flagyl to have your H pylori tested.  Please see me again 3-4 weeks for follow up for your abdominal pain and diabetes. Or sooner if you need anything else.  Take care,   Dr [name]

## 2015-12-07 NOTE — Assessment & Plan Note (Signed)
New diagnosis. Will start metformin today. Discussed lifestyle modifications. Will refer to diabetes education. Patient already on aspirin and Ace inhibitor. Patient is due to have lipid panel.   Follow up in 3-4 weeks. Will likely increase metformin dose at that time if patient tolerates. Will discuss preventative measures including annual eye exam and foot exam at that time.

## 2015-12-07 NOTE — Assessment & Plan Note (Signed)
Wet prep consistent with BV. Will treat with flagyl. Also recommended femdophilus probiotics. Will also treat diabetes with metformin which will likely improve symptoms. If continues to have recurrent symptoms despite above management, would consider trial of intravaginal metronidazole gel.

## 2015-12-22 ENCOUNTER — Emergency Department (HOSPITAL_COMMUNITY)
Admission: EM | Admit: 2015-12-22 | Discharge: 2015-12-22 | Disposition: A | Discharge status: Home / Self Care | Payer: BLUE CROSS/BLUE SHIELD | Attending: Emergency Medicine | Admitting: Emergency Medicine

## 2015-12-22 ENCOUNTER — Encounter (HOSPITAL_COMMUNITY): Payer: Self-pay | Admitting: Emergency Medicine

## 2015-12-22 ENCOUNTER — Emergency Department (HOSPITAL_COMMUNITY): Payer: BLUE CROSS/BLUE SHIELD

## 2015-12-22 DIAGNOSIS — Z8719 Personal history of other diseases of the digestive system: Secondary | ICD-10-CM | POA: Insufficient documentation

## 2015-12-22 DIAGNOSIS — Y998 Other external cause status: Secondary | ICD-10-CM | POA: Insufficient documentation

## 2015-12-22 DIAGNOSIS — Z79899 Other long term (current) drug therapy: Secondary | ICD-10-CM | POA: Diagnosis not present

## 2015-12-22 DIAGNOSIS — G8929 Other chronic pain: Secondary | ICD-10-CM | POA: Diagnosis not present

## 2015-12-22 DIAGNOSIS — Y9389 Activity, other specified: Secondary | ICD-10-CM | POA: Diagnosis not present

## 2015-12-22 DIAGNOSIS — J449 Chronic obstructive pulmonary disease, unspecified: Secondary | ICD-10-CM | POA: Insufficient documentation

## 2015-12-22 DIAGNOSIS — Z8673 Personal history of transient ischemic attack (TIA), and cerebral infarction without residual deficits: Secondary | ICD-10-CM | POA: Insufficient documentation

## 2015-12-22 DIAGNOSIS — Z792 Long term (current) use of antibiotics: Secondary | ICD-10-CM | POA: Diagnosis not present

## 2015-12-22 DIAGNOSIS — I10 Essential (primary) hypertension: Secondary | ICD-10-CM | POA: Diagnosis not present

## 2015-12-22 DIAGNOSIS — Z7982 Long term (current) use of aspirin: Secondary | ICD-10-CM | POA: Insufficient documentation

## 2015-12-22 DIAGNOSIS — S4992XA Unspecified injury of left shoulder and upper arm, initial encounter: Secondary | ICD-10-CM | POA: Insufficient documentation

## 2015-12-22 DIAGNOSIS — Z9889 Other specified postprocedural states: Secondary | ICD-10-CM | POA: Insufficient documentation

## 2015-12-22 DIAGNOSIS — Y92410 Unspecified street and highway as the place of occurrence of the external cause: Secondary | ICD-10-CM | POA: Diagnosis not present

## 2015-12-22 DIAGNOSIS — F17210 Nicotine dependence, cigarettes, uncomplicated: Secondary | ICD-10-CM | POA: Insufficient documentation

## 2015-12-22 DIAGNOSIS — R011 Cardiac murmur, unspecified: Secondary | ICD-10-CM | POA: Diagnosis not present

## 2015-12-22 DIAGNOSIS — I509 Heart failure, unspecified: Secondary | ICD-10-CM | POA: Insufficient documentation

## 2015-12-22 DIAGNOSIS — Z7984 Long term (current) use of oral hypoglycemic drugs: Secondary | ICD-10-CM | POA: Diagnosis not present

## 2015-12-22 DIAGNOSIS — S3992XA Unspecified injury of lower back, initial encounter: Secondary | ICD-10-CM | POA: Insufficient documentation

## 2015-12-22 DIAGNOSIS — M1990 Unspecified osteoarthritis, unspecified site: Secondary | ICD-10-CM | POA: Diagnosis not present

## 2015-12-22 DIAGNOSIS — G43909 Migraine, unspecified, not intractable, without status migrainosus: Secondary | ICD-10-CM | POA: Diagnosis not present

## 2015-12-22 LAB — I-STAT CHEM 8, ED
BUN: 18 mg/dL (ref 6–20)
CALCIUM ION: 1.21 mmol/L (ref 1.12–1.23)
CREATININE: 1.1 mg/dL — AB (ref 0.44–1.00)
Chloride: 100 mmol/L — ABNORMAL LOW (ref 101–111)
Glucose, Bld: 128 mg/dL — ABNORMAL HIGH (ref 65–99)
HEMATOCRIT: 53 % — AB (ref 36.0–46.0)
HEMOGLOBIN: 18 g/dL — AB (ref 12.0–15.0)
Potassium: 3.7 mmol/L (ref 3.5–5.1)
Sodium: 142 mmol/L (ref 135–145)
TCO2: 28 mmol/L (ref 0–100)

## 2015-12-22 MED ORDER — IBUPROFEN 600 MG PO TABS: 600.0000 mg | ORAL_TABLET | Freq: Four times a day (QID) | ORAL | Status: DC | PRN

## 2015-12-22 MED ORDER — METHOCARBAMOL 500 MG PO TABS: 500.0000 mg | ORAL_TABLET | Freq: Two times a day (BID) | ORAL | Status: DC

## 2015-12-22 MED ORDER — METHOCARBAMOL 500 MG PO TABS
1000.0000 mg | ORAL_TABLET | Freq: Once | ORAL | Status: AC
Start: 2015-12-22 — End: 2015-12-22
  Administered 2015-12-22: 1000 mg via ORAL
  Filled 2015-12-22: qty 2

## 2015-12-22 MED ORDER — IBUPROFEN 400 MG PO TABS
600.0000 mg | ORAL_TABLET | Freq: Once | ORAL | Status: AC
  Administered 2015-12-22: 600 mg via ORAL
  Filled 2015-12-22: qty 1
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
## 2015-12-22 NOTE — ED Notes (Signed)
Pt ambulates independently and with steady gait at time of discharge. Discharge instructions and follow up information reviewed with patient. No other questions or concerns voiced at this time.  

## 2015-12-23 ENCOUNTER — Other Ambulatory Visit: Payer: Self-pay | Admitting: Family Medicine

## 2015-12-25 ENCOUNTER — Ambulatory Visit: Payer: BLUE CROSS/BLUE SHIELD

## 2015-12-26 ENCOUNTER — Institutional Professional Consult (permissible substitution): Payer: BLUE CROSS/BLUE SHIELD | Admitting: Cardiology
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
## 2016-01-01 ENCOUNTER — Ambulatory Visit: Payer: BLUE CROSS/BLUE SHIELD

## 2016-01-08 ENCOUNTER — Ambulatory Visit: Payer: BLUE CROSS/BLUE SHIELD

## 2016-01-18 ENCOUNTER — Encounter (HOSPITAL_COMMUNITY): Payer: Self-pay | Admitting: General Practice

## 2016-01-18 ENCOUNTER — Encounter: Payer: Self-pay | Admitting: Family Medicine

## 2016-01-18 ENCOUNTER — Inpatient Hospital Stay (HOSPITAL_COMMUNITY)
Admission: AD | Admit: 2016-01-18 | Discharge: 2016-01-20 | DRG: 191 | Disposition: A | Discharge status: Home / Self Care | Payer: BLUE CROSS/BLUE SHIELD | Source: Ambulatory Visit | Attending: Family Medicine | Admitting: Family Medicine

## 2016-01-18 ENCOUNTER — Inpatient Hospital Stay (HOSPITAL_COMMUNITY): Payer: BLUE CROSS/BLUE SHIELD

## 2016-01-18 ENCOUNTER — Ambulatory Visit (INDEPENDENT_AMBULATORY_CARE_PROVIDER_SITE_OTHER): Payer: BLUE CROSS/BLUE SHIELD | Admitting: Family Medicine

## 2016-01-18 ENCOUNTER — Inpatient Hospital Stay (HOSPITAL_COMMUNITY)
Admission: AD | Admit: 2016-01-18 | Payer: BLUE CROSS/BLUE SHIELD | Source: Ambulatory Visit | Admitting: Family Medicine

## 2016-01-18 VITALS — BP 152/90 | HR 105 | Temp 98.1°F | Resp 24 | Wt 282.0 lb

## 2016-01-18 DIAGNOSIS — E119 Type 2 diabetes mellitus without complications: Secondary | ICD-10-CM | POA: Diagnosis present

## 2016-01-18 DIAGNOSIS — H547 Unspecified visual loss: Secondary | ICD-10-CM | POA: Diagnosis present

## 2016-01-18 DIAGNOSIS — Z7984 Long term (current) use of oral hypoglycemic drugs: Secondary | ICD-10-CM | POA: Diagnosis not present

## 2016-01-18 DIAGNOSIS — I10 Essential (primary) hypertension: Secondary | ICD-10-CM | POA: Diagnosis not present

## 2016-01-18 DIAGNOSIS — Z7982 Long term (current) use of aspirin: Secondary | ICD-10-CM | POA: Diagnosis not present

## 2016-01-18 DIAGNOSIS — R0602 Shortness of breath: Secondary | ICD-10-CM | POA: Diagnosis present

## 2016-01-18 DIAGNOSIS — Z8249 Family history of ischemic heart disease and other diseases of the circulatory system: Secondary | ICD-10-CM

## 2016-01-18 DIAGNOSIS — K219 Gastro-esophageal reflux disease without esophagitis: Secondary | ICD-10-CM | POA: Diagnosis present

## 2016-01-18 DIAGNOSIS — J441 Chronic obstructive pulmonary disease with (acute) exacerbation: Secondary | ICD-10-CM

## 2016-01-18 DIAGNOSIS — I110 Hypertensive heart disease with heart failure: Secondary | ICD-10-CM | POA: Diagnosis present

## 2016-01-18 DIAGNOSIS — R0603 Acute respiratory distress: Secondary | ICD-10-CM

## 2016-01-18 DIAGNOSIS — K7581 Nonalcoholic steatohepatitis (NASH): Secondary | ICD-10-CM | POA: Diagnosis present

## 2016-01-18 DIAGNOSIS — R001 Bradycardia, unspecified: Secondary | ICD-10-CM | POA: Diagnosis present

## 2016-01-18 DIAGNOSIS — Z79899 Other long term (current) drug therapy: Secondary | ICD-10-CM | POA: Diagnosis not present

## 2016-01-18 DIAGNOSIS — Z8673 Personal history of transient ischemic attack (TIA), and cerebral infarction without residual deficits: Secondary | ICD-10-CM | POA: Diagnosis not present

## 2016-01-18 DIAGNOSIS — G8929 Other chronic pain: Secondary | ICD-10-CM | POA: Diagnosis present

## 2016-01-18 DIAGNOSIS — E861 Hypovolemia: Secondary | ICD-10-CM | POA: Diagnosis present

## 2016-01-18 DIAGNOSIS — M545 Low back pain: Secondary | ICD-10-CM | POA: Diagnosis present

## 2016-01-18 DIAGNOSIS — E785 Hyperlipidemia, unspecified: Secondary | ICD-10-CM | POA: Diagnosis present

## 2016-01-18 DIAGNOSIS — I5033 Acute on chronic diastolic (congestive) heart failure: Secondary | ICD-10-CM | POA: Insufficient documentation

## 2016-01-18 DIAGNOSIS — R0600 Dyspnea, unspecified: Secondary | ICD-10-CM | POA: Diagnosis not present

## 2016-01-18 DIAGNOSIS — I5032 Chronic diastolic (congestive) heart failure: Secondary | ICD-10-CM | POA: Diagnosis present

## 2016-01-18 DIAGNOSIS — F17210 Nicotine dependence, cigarettes, uncomplicated: Secondary | ICD-10-CM | POA: Diagnosis present

## 2016-01-18 LAB — COMPREHENSIVE METABOLIC PANEL
ALK PHOS: 83 U/L (ref 38–126)
ALT: 35 U/L (ref 14–54)
ANION GAP: 14 (ref 5–15)
AST: 37 U/L (ref 15–41)
Albumin: 3.8 g/dL (ref 3.5–5.0)
BILIRUBIN TOTAL: 0.5 mg/dL (ref 0.3–1.2)
BUN: 12 mg/dL (ref 6–20)
CALCIUM: 8.9 mg/dL (ref 8.9–10.3)
CHLORIDE: 100 mmol/L — AB (ref 101–111)
CO2: 26 mmol/L (ref 22–32)
CREATININE: 1.16 mg/dL — AB (ref 0.44–1.00)
GFR, EST NON AFRICAN AMERICAN: 53 mL/min — AB (ref >60)
GLUCOSE: 132 mg/dL — AB (ref 65–99)
Potassium: 3.3 mmol/L — ABNORMAL LOW (ref 3.5–5.1)
Sodium: 140 mmol/L (ref 135–145)
Total Protein: 7.1 g/dL (ref 6.5–8.1)

## 2016-01-18 LAB — CBC
HCT: 52.3 % — ABNORMAL HIGH (ref 36.0–46.0)
Hemoglobin: 17.4 g/dL — ABNORMAL HIGH (ref 12.0–15.0)
MCH: 27.9 pg (ref 26.0–34.0)
MCHC: 33.3 g/dL (ref 30.0–36.0)
MCV: 83.8 fL (ref 78.0–100.0)
PLATELETS: 214 10*3/uL (ref 150–400)
RBC: 6.24 MIL/uL — ABNORMAL HIGH (ref 3.87–5.11)
RDW: 13.7 % (ref 11.5–15.5)
WBC: 9.9 10*3/uL (ref 4.0–10.5)

## 2016-01-18 LAB — GLUCOSE, CAPILLARY: GLUCOSE-CAPILLARY: 231 mg/dL — AB (ref 65–99)

## 2016-01-18 LAB — INFLUENZA PANEL BY PCR (TYPE A & B)
H1N1FLUPCR: NOT DETECTED
Influenza A By PCR: NEGATIVE
Influenza B By PCR: NEGATIVE

## 2016-01-18 LAB — MAGNESIUM: Magnesium: 1.6 mg/dL — ABNORMAL LOW (ref 1.7–2.4)

## 2016-01-18 LAB — BRAIN NATRIURETIC PEPTIDE: B NATRIURETIC PEPTIDE 5: 13 pg/mL (ref 0.0–100.0)

## 2016-01-18 MED ORDER — IPRATROPIUM BROMIDE 0.02 % IN SOLN
0.5000 mg | Freq: Once | RESPIRATORY_TRACT | Status: AC
  Administered 2016-01-18: 0.5 mg via RESPIRATORY_TRACT

## 2016-01-18 MED ORDER — TIOTROPIUM BROMIDE MONOHYDRATE 18 MCG IN CAPS
18.0000 ug | ORAL_CAPSULE | Freq: Every day | RESPIRATORY_TRACT | Status: DC
  Administered 2016-01-19 – 2016-01-20 (×2): 18 ug via RESPIRATORY_TRACT
  Filled 2016-01-18: qty 5

## 2016-01-18 MED ORDER — ALBUTEROL SULFATE (2.5 MG/3ML) 0.083% IN NEBU: 2.5000 mg | INHALATION_SOLUTION | RESPIRATORY_TRACT | Status: DC | PRN

## 2016-01-18 MED ORDER — INSULIN ASPART 100 UNIT/ML ~~LOC~~ SOLN
0.0000 [IU] | Freq: Every day | SUBCUTANEOUS | Status: DC
  Administered 2016-01-18: 2 [IU] via SUBCUTANEOUS

## 2016-01-18 MED ORDER — METHYLPREDNISOLONE SODIUM SUCC 125 MG IJ SOLR
125.0000 mg | Freq: Once | INTRAMUSCULAR | Status: AC
  Administered 2016-01-18: 125 mg via INTRAMUSCULAR

## 2016-01-18 MED ORDER — SODIUM CHLORIDE 0.9 % IV SOLN
INTRAVENOUS | Status: DC
  Administered 2016-01-18 – 2016-01-19 (×2): via INTRAVENOUS

## 2016-01-18 MED ORDER — IPRATROPIUM-ALBUTEROL 0.5-2.5 (3) MG/3ML IN SOLN
3.0000 mL | Freq: Four times a day (QID) | RESPIRATORY_TRACT | Status: DC
Start: 2016-01-19 — End: 2016-01-20
  Administered 2016-01-19 – 2016-01-20 (×4): 3 mL via RESPIRATORY_TRACT
  Filled 2016-01-18 (×4): qty 3

## 2016-01-18 MED ORDER — HYDRALAZINE HCL 20 MG/ML IJ SOLN: 5.0000 mg | Freq: Four times a day (QID) | INTRAMUSCULAR | Status: DC | PRN

## 2016-01-18 MED ORDER — INSULIN ASPART 100 UNIT/ML ~~LOC~~ SOLN
0.0000 [IU] | Freq: Three times a day (TID) | SUBCUTANEOUS | Status: DC
  Administered 2016-01-19 – 2016-01-20 (×4): 2 [IU] via SUBCUTANEOUS

## 2016-01-18 MED ORDER — ALBUTEROL SULFATE (2.5 MG/3ML) 0.083% IN NEBU
2.5000 mg | INHALATION_SOLUTION | Freq: Once | RESPIRATORY_TRACT | Status: AC
  Administered 2016-01-18: 2.5 mg via RESPIRATORY_TRACT

## 2016-01-18 MED ORDER — DOXYCYCLINE HYCLATE 100 MG PO TABS
100.0000 mg | ORAL_TABLET | Freq: Two times a day (BID) | ORAL | Status: DC
  Administered 2016-01-18 – 2016-01-20 (×4): 100 mg via ORAL
  Filled 2016-01-18 (×4): qty 1

## 2016-01-18 MED ORDER — IPRATROPIUM-ALBUTEROL 0.5-2.5 (3) MG/3ML IN SOLN
3.0000 mL | RESPIRATORY_TRACT | Status: DC
  Administered 2016-01-18: 3 mL via RESPIRATORY_TRACT
  Filled 2016-01-18: qty 3

## 2016-01-18 MED ORDER — SPIRONOLACTONE 25 MG PO TABS
25.0000 mg | ORAL_TABLET | Freq: Every day | ORAL | Status: DC
  Administered 2016-01-19 – 2016-01-20 (×2): 25 mg via ORAL
  Filled 2016-01-18 (×2): qty 1

## 2016-01-18 MED ORDER — SODIUM CHLORIDE 0.9% FLUSH
3.0000 mL | Freq: Two times a day (BID) | INTRAVENOUS | Status: DC
  Administered 2016-01-19 – 2016-01-20 (×3): 3 mL via INTRAVENOUS

## 2016-01-18 MED ORDER — ASPIRIN EC 81 MG PO TBEC
81.0000 mg | DELAYED_RELEASE_TABLET | Freq: Every day | ORAL | Status: DC
Start: 2016-01-19 — End: 2016-01-20
  Administered 2016-01-19 – 2016-01-20 (×2): 81 mg via ORAL
  Filled 2016-01-18 (×2): qty 1

## 2016-01-18 MED ORDER — PREDNISONE 20 MG PO TABS
50.0000 mg | ORAL_TABLET | Freq: Every day | ORAL | Status: DC
  Administered 2016-01-19 – 2016-01-20 (×2): 50 mg via ORAL
  Filled 2016-01-18 (×2): qty 2

## 2016-01-18 MED ORDER — POTASSIUM CHLORIDE CRYS ER 20 MEQ PO TBCR
40.0000 meq | EXTENDED_RELEASE_TABLET | Freq: Once | ORAL | Status: AC
  Administered 2016-01-18: 40 meq via ORAL
  Filled 2016-01-18: qty 2

## 2016-01-18 MED ORDER — CARVEDILOL 25 MG PO TABS
25.0000 mg | ORAL_TABLET | Freq: Two times a day (BID) | ORAL | Status: DC
  Administered 2016-01-18 – 2016-01-20 (×4): 25 mg via ORAL
  Filled 2016-01-18 (×4): qty 1

## 2016-01-18 MED ORDER — LISINOPRIL 10 MG PO TABS
20.0000 mg | ORAL_TABLET | Freq: Every day | ORAL | Status: DC
  Administered 2016-01-19 – 2016-01-20 (×2): 20 mg via ORAL
  Filled 2016-01-18 (×2): qty 2

## 2016-01-18 MED ORDER — FUROSEMIDE 80 MG PO TABS: 120.0000 mg | ORAL_TABLET | Freq: Two times a day (BID) | ORAL | Status: DC

## 2016-01-18 MED ORDER — MOMETASONE FURO-FORMOTEROL FUM 200-5 MCG/ACT IN AERO
2.0000 | INHALATION_SPRAY | Freq: Two times a day (BID) | RESPIRATORY_TRACT | Status: DC
  Administered 2016-01-18 – 2016-01-20 (×4): 2 via RESPIRATORY_TRACT
  Filled 2016-01-18: qty 8.8

## 2016-01-18 MED ORDER — HEPARIN SODIUM (PORCINE) 5000 UNIT/ML IJ SOLN
5000.0000 [IU] | Freq: Three times a day (TID) | INTRAMUSCULAR | Status: DC
  Administered 2016-01-18 – 2016-01-20 (×6): 5000 [IU] via SUBCUTANEOUS
  Filled 2016-01-18 (×6): qty 1

## 2016-01-18 NOTE — Assessment & Plan Note (Signed)
Appears most consistent with COPD exacerbation Concern patient may have initially had the flu No evidence of volume overload or CHF exacerbation Low concern for PE as etiology Patient given 2 DuoNeb nebs in clinic and IM Solu-Medrol 125 g with minimal improvement in air movement Patient to be directly admitted to telemetry for management of respiratory status Advised daily prednisone and doxycycline/Levaquin for COPD exacerbation Advise scheduled duo nebs Advise flu testing

## 2016-01-18 NOTE — Progress Notes (Signed)
   Subjective:   Hailey Barnes is a 55 y.o. female with a history of Diastolic CHF, HTN, [place], T2 DM, tobacco abuse, COPD here for shortness of breath.  Patient reports that she took her granddaughter to the ED 5 days ago. The day afterward she started having flulike symptoms including chills, fevers, body aches, nonproductive cough. After 2 days, her fever and chills and body aches are better but she started having difficulty breathing. She is using her albuterol every 2 hours and is not helping. She is also using her Dulera more than prescribed. She reports that she feels like she needs to cough something up but is unable to in her cough tastes like pus. She reports decreased appetite but good fluid intake. She's been sleeping on 5 pillows when she usually sleeps on 3-4, but does not feel like she has any lower extremity edema or extra fluid on board. She did receive her flu vaccine on 12/07/15.  Review of Systems:  Per HPI.   Social History: current smoker  Objective:  BP 152/90 mmHg  Pulse 105  Temp(Src) 98.1 F (36.7 C) (Oral)  Resp 24  Wt 282 lb (127.914 kg)  SpO2 97%  LMP 08/31/2015  Gen:  55 y.o. female who appears ill HEENT: NCAT, MMM, EOMI, PERRL, anicteric sclerae, OP clear, TMs clear b/l Neck: Supple, no LAD CV: mild tachycardia, RR, no MRG Resp: Initially with poor air movement diffusely. After 2 nebulizers, remains diminished in the bases with extensive inspiratory and expiratory wheezes diffusely. Breathing labored and unable to speak in full sentences. Ext: WWP, no edema MSK: No obvious deformities Neuro: Alert and oriented, speech normal     Assessment & Plan:     Farhana Fellows is a 55 y.o. female here for SOB  COPD exacerbation ([place]) Appears most consistent with COPD exacerbation Concern patient may have initially had the flu No evidence of volume overload or CHF exacerbation Low concern for PE as etiology Patient given 2 DuoNeb nebs in clinic and IM  Solu-Medrol 125 g with minimal improvement in air movement Patient to be directly admitted to telemetry for management of respiratory status Advised daily prednisone and doxycycline/Levaquin for COPD exacerbation Advise scheduled duo nebs Advise flu testing     Virginia Crews, MD MPH PGY-2,  [place] [facility] 01/18/2016  3:45 PM

## 2016-01-18 NOTE — H&P (Signed)
[facility] Admission History and Physical Service Pager: [phone]  Patient name: Hailey Barnes Medical record number: [id number] Date of birth: 1961/07/05 Age: 55 y.o. Gender: female  Primary Care Provider: Dimas Chyle, MD Consultants: None Code Status: FULL per discussion on admission  Chief Complaint: SOB  Assessment and Plan: Hailey Barnes is a 55 y.o. female presenting with SOB, cough, sputum production. PMH is significant for COPD, HFpEF, HTN, HLD, T2DM, and tobacco abuse  Respiratory distress: Most consistent with a COPD exacerbation given SOB, cough, and sputum production. May have had a viral infection leading to this exacerbation, complicated by continued tobacco use. Patient with a h/o HFpEF with reported exacerbation in the past, however appears evoluemic on physical exam. Would like to rule out pneumonia given subjective fevers and other URI symptoms, however no crackles noted on exam. Low suspicion for PE.  -Admit to telemetry, attending Dr. Ree Kida - Continuous pulse ox  - will obtain a BNP (however does not appear fluid overloaded) and CXR.  - EKG now  - Duonebs q4hr, albuterol q2hr PRN  - continue home Dulera and Spiriva (does not look like these were active medications however pt reports she's still using them).  - doxycycline 196m BID - transition to prednisone on 4/1 - given myalgias, subjective fevers, and time of year, will get influenza panel   COPD- as above.  - Continuing home Dulera, Spiriva, albuterol PRN. - scheduled duonebs - smoking cessation.    Diastolic heart failure: Last echo 8/15 with EF 65-70% with grade 1 diastolic dysfunction (previously G2DD).  Per our records, she's on Lasix 1263mdaily, however sometimes takes16010mer her report if she notices swelling. Also on coreg, lisinopril, and spironlactone (ran out of this med). - continue Lasix 120m1moreg, lisinopril   - restart aldactone   HTN: elevated on  admission, I suspect partially due to coughing fits.  - Continuing home Coreg, lisinopril, and aldactone. - hydralazine PRN   Tobacco use: spokes 1ppd prior to illness, last smoked 3 cigs yesterday. Patient has gotten sick to her stomach when she used her nephew's 14mg81motine patch.   -  Hold off on nicotine patch for now, consider 7mg i2muture if needed.   Diabetes: -Hold home metformin  - sensitive SSI - CBGs ACHS   FEN/GI: heart health, carb modified diet, IV saline lock, if BNP low will start NS given poor PO intake Prophylaxis: SQ heparin    Disposition:  Admit to telemetry, attending Dr. EniolaGwendlyn Deutschertory of Present Illness:  Hailey Ueda55 y.o63female presenting with SOB and cough.   Tuesday morning the patient started having chills, myalgias, decreased appetite, and subjective fevers. On Wednesday she started having difficulty breathing that has progressively gotten worse. This is when her coughing started. Her cough is non-productive. She feels she's had more sputum production but just can't get it out.  She's also noted hoarseness starting on Wednesday. She's been having abdominal pain with diarrhea since Wednesday. On ROS she has headaches that are behind the eyes and feels throbbing and "tight." No photophobia, sonophobia, or blurred vision. During this change, she's used 5 pillows instead 3-4 pillows. No LE edema.   She's used her albuterol inhaler 2 puffs every 2 hrs since since Wednesday. She was using Dulera approximately 10 times per day (she's been taking it as needed- prior to this she's been using this as maintenance therapy). She has not been using Spiriva.  She has received her  influenza vaccine. She continues to smoke.  Of note, her grandchild was sick and she was in the ED Monday, so the patient suspects she may have caught something from her.   At clinic she was noted to be in respiratory distress, unable to speak in full sentences with poor air movement on  exam. She was given solumedrol 125 IM and 2 DouNeb treatments with mild improvement.   Review Of Systems: Per HPI with the following additions: none Otherwise the remainder of the systems were negative.  Patient Active Problem List   Diagnosis Date Noted  . COPD exacerbation ([place]) 01/18/2016  . T2DM (type 2 diabetes mellitus) ([place]) 12/07/2015  . Bacterial vaginosis 11/22/2015  . RUQ abdominal pain 09/21/2015  . Occipital lymphadenopathy 09/21/2015  . Hydrosalpinx 07/05/2015  . Depression 04/19/2015  . Abnormal uterine bleeding 12/07/2014  . Allergic rhinitis 08/01/2014  . Rash and nonspecific skin eruption 08/01/2014  . Eczema 08/01/2014  . Diminished vision 06/23/2014  . Chronic diastolic CHF (congestive heart failure) ([place]) 06/13/2014  . Restrictive lung disease 05/10/2014  . NASH (nonalcoholic steatohepatitis) 01/04/2014  . Diastolic dysfunction 29/47/6546  . Hypertension 05/26/2010  . OBESITY, NOS 12/17/2006  . TOBACCO DEPENDENCE 12/17/2006    Past Medical History: Past Medical History  Diagnosis Date  . 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   Marland Kitchen Hypertension   . COPD (chronic obstructive pulmonary disease) ([place])   . Tingling in extremities 12/12/2010    Uric acid and electrolytes (02/23) normal but WBC elevated.   D/Dx: carpal tunnel, ulnar neuropathy Less likely: cervical radiculopathy, vasculitis   . CHF (congestive heart failure) ([place])   . Chronic bronchitis ([place])     "get it q yr" (02/21/2014)  . Arthritis     "knees" (02/21/2014)  . Complication of anesthesia     "I don't come out well; I chew on my tongue"  . Heart murmur   . GERD (gastroesophageal reflux disease)   . Migraine 1982-2009  . Stroke [facility]) noted on CAT 02/2014    "light", LLE weakness remains (03/30/2014)  . Chronic lower back pain   . Shortness of breath     Past Surgical History: Past Surgical History  Procedure Laterality Date  . Cesarean section  1982  . Foot  surgery Bilateral     "took bones out; put pins in"  . Left and right heart catheterization with coronary angiogram N/A 03/31/2014    Procedure: LEFT AND RIGHT HEART CATHETERIZATION WITH CORONARY ANGIOGRAM;  Surgeon: Birdie Riddle, MD;  Location: [facility];  Service: Cardiovascular;  Laterality: N/A;    Social History: Social History  Substance Use Topics  . Smoking status: Current Every Day Smoker -- 0.25 packs/day for 37 years    Types: Cigarettes  . Smokeless tobacco: Never Used     Comment: taking chantix  . Alcohol Use: No   Additional social history: continues to smoke, denies alcohol or drug use  Please also refer to relevant sections of EMR.  Family History: Family History  Problem Relation Age of Onset  . Hypertension Mother   . Hypertension Father     Allergies and Medications: No Known Allergies No current facility-administered medications on file prior to encounter.   Current Outpatient Prescriptions on File Prior to Encounter  Medication Sig Dispense Refill  . albuterol (PROVENTIL HFA;VENTOLIN HFA) 108 (90 BASE) MCG/ACT inhaler Inhale 2 puffs into the lungs 2 (two) times daily. 2 Inhaler 1  .  aspirin 81 MG tablet Take 81 mg by mouth daily.    . carvedilol (COREG) 25 MG tablet TAKE ONE TABLET BY MOUTH TWICE DAILY WITH A MEAL 60 tablet 0  . furosemide (LASIX) 40 MG tablet TAKE THREE TABLETS BY MOUTH ONCE DAILY (Patient taking differently: Take 120 mg by mouth daily. ) 90 tablet 3  . furosemide (LASIX) 40 MG tablet Take 3 tablets (120 mg total) by mouth 2 (two) times daily. 90 tablet 0  . gabapentin (NEURONTIN) 100 MG capsule Take 1 capsule (100 mg total) by mouth at bedtime. (Patient not taking: Reported on 09/21/2015) 30 capsule 3  . ibuprofen (ADVIL,MOTRIN) 600 MG tablet Take 1 tablet (600 mg total) by mouth every 6 (six) hours as needed. 30 tablet 0  . lisinopril (PRINIVIL,ZESTRIL) 10 MG tablet Take 2 tablets (20 mg total) by mouth daily. 30 tablet 2  . metFORMIN  (GLUCOPHAGE) 500 MG tablet Take 1 tablet (500 mg total) by mouth 2 (two) times daily with a meal. 60 tablet 3  . methocarbamol (ROBAXIN) 500 MG tablet Take 1 tablet (500 mg total) by mouth 2 (two) times daily. 20 tablet 0  . metroNIDAZOLE (FLAGYL) 500 MG tablet Take 1 tablet (500 mg total) by mouth 2 (two) times daily. 14 tablet 0  . spironolactone (ALDACTONE) 25 MG tablet Take 1 tablet (25 mg total) by mouth daily. 30 tablet 0  . [DISCONTINUED] loratadine (CLARITIN) 10 MG tablet Take 1 tablet (10 mg total) by mouth daily. (Patient not taking: Reported on 06/14/2015) 30 tablet 11  . [DISCONTINUED] omeprazole (PRILOSEC) 20 MG capsule Take 1 capsule (20 mg total) by mouth daily. (Patient not taking: Reported on 07/05/2015) 30 capsule 0  . [DISCONTINUED] sertraline (ZOLOFT) 50 MG tablet Take 1 tablet (50 mg total) by mouth daily. (Patient not taking: Reported on 06/14/2015) 30 tablet 3    Objective: BP 161/115 mmHg  Pulse 93  Temp(Src) 97.3 F (36.3 C) (Oral)  Resp 20  Wt 284 lb 4.8 oz (128.958 kg)  SpO2 99%  LMP 08/31/2015 Exam: General: Lying in bed on the lateral side, appears acutely ill but non-toxic.  Eyes: Conjunctivae non-injected.  ENTM: Moist mucous membranes. Oropharynx clear. No nasal discharge.  Neck: Supple, no LAD Cardiovascular: RRR. No murmurs, rubs, or gallops noted. No pitting edema noted. No JVD.  Respiratory: Breathing labored, especially after coughing. Inspiratory and expiratory wheezes noted bilaterally, diminished sounds in the bases bilaterally. No crackles or rhonchi noted. Able to speak in full sentences. On RA currently.  Abdomen: +BS, soft, non-distended, non-tender.  MSK: Normal bulk and tone. No gross deformities noted.  Skin: No rashes noted  Neuro: A&O x4. No gross neurologic deficits  Psych:  Appropriate mood and affect.    Labs and Imaging: None  Archie Patten, MD 01/18/2016, 4:19 PM PGY-2, [place] [facility] pager: [phone],  text pages welcome

## 2016-01-19 DIAGNOSIS — R0602 Shortness of breath: Secondary | ICD-10-CM | POA: Insufficient documentation

## 2016-01-19 LAB — GLUCOSE, CAPILLARY
GLUCOSE-CAPILLARY: 166 mg/dL — AB (ref 65–99)
GLUCOSE-CAPILLARY: 189 mg/dL — AB (ref 65–99)
Glucose-Capillary: 162 mg/dL — ABNORMAL HIGH (ref 65–99)
Glucose-Capillary: 193 mg/dL — ABNORMAL HIGH (ref 65–99)

## 2016-01-19 LAB — CBC
HCT: 47.6 % — ABNORMAL HIGH (ref 36.0–46.0)
Hemoglobin: 16.4 g/dL — ABNORMAL HIGH (ref 12.0–15.0)
MCH: 29 pg (ref 26.0–34.0)
MCHC: 34.5 g/dL (ref 30.0–36.0)
MCV: 84.2 fL (ref 78.0–100.0)
PLATELETS: 202 10*3/uL (ref 150–400)
RBC: 5.65 MIL/uL — AB (ref 3.87–5.11)
RDW: 13.8 % (ref 11.5–15.5)
WBC: 12.3 10*3/uL — ABNORMAL HIGH (ref 4.0–10.5)

## 2016-01-19 LAB — BASIC METABOLIC PANEL
Anion gap: 12 (ref 5–15)
BUN: 13 mg/dL (ref 6–20)
CHLORIDE: 101 mmol/L (ref 101–111)
CO2: 21 mmol/L — AB (ref 22–32)
CREATININE: 0.88 mg/dL (ref 0.44–1.00)
Calcium: 9.1 mg/dL (ref 8.9–10.3)
GFR calc Af Amer: >60 mL/min (ref >60)
GFR calc non Af Amer: >60 mL/min (ref >60)
Glucose, Bld: 173 mg/dL — ABNORMAL HIGH (ref 65–99)
Potassium: 4.1 mmol/L (ref 3.5–5.1)
Sodium: 134 mmol/L — ABNORMAL LOW (ref 135–145)

## 2016-01-19 MED ORDER — ACETAMINOPHEN 325 MG PO TABS
650.0000 mg | ORAL_TABLET | Freq: Four times a day (QID) | ORAL | Status: DC | PRN
  Administered 2016-01-19: 650 mg via ORAL
  Filled 2016-01-19: qty 2

## 2016-01-19 NOTE — Discharge Summary (Signed)
[facility] Discharge Summary  Patient name: Hailey Barnes Medical record number: [id number] Date of birth: 1961/06/09 Age: 55 y.o. Gender: female Date of Admission: 01/18/2016  Date of Discharge: 01/20/2016  Admitting Physician: Kinnie Feil, MD  Primary Care Provider: Dimas Chyle, MD Consultants: none  Indication for Hospitalization: acute respiratory distress, COPD exacerbation  Discharge Diagnoses/Problem List:  Respiratory distress - resolved HFpEF HTN Tobacco use T2DM  Disposition: home  Discharge Condition: stable, improved  Discharge Exam:   Filed Vitals:   01/19/16 2147 01/20/16 0440  BP: 125/69 170/109  Pulse: 73 70  Temp: 97.8 F (36.6 C) 97.9 F (36.6 C)  Resp: 18    General: sitting comfortably in bed, NAD Cardiovascular: RRR, no m/r/g Respiratory: Normal WOB, speaks in full sentences, rare expiratory wheeze, good air movement Abdomen: soft, NTND, +BS Extremities: WWP, no edema  Brief Hospital Course:   Hailey Barnes is a 55 y.o. female presenting with SOB, cough, sputum production. PMH is significant for COPD, HFpEF, HTN, HLD, T2DM, and tobacco abuse  Respiratory distress: Most consistent with a COPD exacerbation given SOB, cough, and sputum production. CXR clear, BNP wnl, flu neg.  Patient Stable on room air throughout admission and work of breathing improved and normalized prior to discharge. Home Dulera and Spiriva and albuterol as needed continued throughout admission. Patient also treated with scheduled DuoNeb's every 4 hours, doxycycline 100 mg twice a day, and prednisone 50 mg daily. Patient will finish 5 day courses of doxycycline and prednisone after discharge. Patient was given smoking cessation counseling during admission. Lasix was initially held as patient was hypovolemic on admission, but restarted at discharge.  HTN: BP intermittently elevated through hospitalization.  Seemed to be related to coughing fits, as  other times it was low-normal.  Home BP meds continued, no prn Hydralazine necessary. Patient asymptomatic.  Patient was noted to have brief 1.5 sec pauses and intermittent sinus bradycardia on telemetry when sleeping.  This may be related to undiagnosed OSA.  Because HR stable throughout day, coreg dose remained the same.  All other chronic medical conditions stable and managed with home regimens throughout hospitalization.  Issues for Follow Up:  - f/u resp status and resolution of illness with finished courses of prednisone and doxycycline - f/u BP - f/u HR, consider sleep study  Significant Procedures: none  Significant Labs and Imaging:   Recent Labs Lab 01/18/16 1811 01/19/16 1219  WBC 9.9 12.3*  HGB 17.4* 16.4*  HCT 52.3* 47.6*  PLT 214 202    Recent Labs Lab 01/18/16 1811 01/19/16 1219  NA 140 134*  K 3.3* 4.1  CL 100* 101  CO2 26 21*  GLUCOSE 132* 173*  BUN 12 13  CREATININE 1.16* 0.88  CALCIUM 8.9 9.1  MG 1.6*  --   ALKPHOS 83  --   AST 37  --   ALT 35  --   ALBUMIN 3.8  --     Flu PCR neg BNP 13  Imaging/Diagnostic Tests: X-ray Chest Pa And Lateral  01/18/2016 CLINICAL DATA: Shortness of breath and respiratory distress. EXAM: CHEST 2 VIEW COMPARISON: 09/18/2015 FINDINGS: Lungs are hypoinflated without consolidation or effusion. Subtle density objective the spine on the lateral film likely due to prominent osteophyte formation. Cardiomediastinal silhouette and remainder the exam is unchanged. IMPRESSION: No active cardiopulmonary disease. Electronically Signed By: Marin Olp M.D. On: 01/18/2016 20:54   Results/Tests Pending at Time of Discharge: none  Discharge Medications:    Medication List  TAKE these medications        albuterol 108 (90 Base) MCG/ACT inhaler  Commonly known as:  PROVENTIL HFA;VENTOLIN HFA  Inhale 2 puffs into the lungs 2 (two) times daily.     aspirin EC 81 MG tablet  Take 81 mg by mouth daily.     carvedilol  25 MG tablet  Commonly known as:  COREG  TAKE ONE TABLET BY MOUTH TWICE DAILY WITH A MEAL     doxycycline 100 MG tablet  Commonly known as:  VIBRA-TABS  Take 1 tablet (100 mg total) by mouth every 12 (twelve) hours.     DULERA 200-5 MCG/ACT Aero  Generic drug:  mometasone-formoterol  Inhale 2 puffs into the lungs 2 (two) times daily.     furosemide 40 MG tablet  Commonly known as:  LASIX  Take 3 tablets (120 mg total) by mouth 2 (two) times daily.     gabapentin 100 MG capsule  Commonly known as:  NEURONTIN  Take 1 capsule (100 mg total) by mouth at bedtime.     hydrocortisone 1 % ointment  Apply 1 application topically 2 (two) times daily as needed (eczema on face).     ibuprofen 600 MG tablet  Commonly known as:  ADVIL,MOTRIN  Take 1 tablet (600 mg total) by mouth every 6 (six) hours as needed.     lisinopril 10 MG tablet  Commonly known as:  PRINIVIL,ZESTRIL  Take 2 tablets (20 mg total) by mouth daily.     metFORMIN 500 MG tablet  Commonly known as:  GLUCOPHAGE  Take 1 tablet (500 mg total) by mouth 2 (two) times daily with a meal.     methocarbamol 500 MG tablet  Commonly known as:  ROBAXIN  Take 1 tablet (500 mg total) by mouth 2 (two) times daily.     predniSONE 50 MG tablet  Commonly known as:  DELTASONE  Take 1 tablet (50 mg total) by mouth daily with breakfast.  Start taking on:  01/21/2016     spironolactone 25 MG tablet  Commonly known as:  ALDACTONE  Take 1 tablet (25 mg total) by mouth daily.     tiotropium 18 MCG inhalation capsule  Commonly known as:  SPIRIVA  Place 1 capsule (18 mcg total) into inhaler and inhale daily.     triamcinolone cream 0.1 %  Commonly known as:  KENALOG  Apply 1 application topically 3 (three) times daily as needed (eczema on arms).        Discharge Instructions: Please refer to Patient Instructions section of EMR for full details.  Patient was counseled important signs and symptoms that should prompt return to medical  care, changes in medications, dietary instructions, activity restrictions, and follow up appointments.   Follow-Up Appointments: Follow-up Information    Follow up with Phill Myron, MD. Go on 01/22/2016.   Specialty:  Family Medicine   Why:  For hospital follow-up, appointment made at 9:45am   Contact information:   [place] [address] [phone]       Virginia Crews, MD 01/20/2016, 7:31 AM PGY-2, [place]

## 2016-01-19 NOTE — Progress Notes (Signed)
Patient sleeping and Had a short run S.B. With a couple of pauses. Then immed. Back up to [facility]. [facility]. To monitor patient and rhythm.

## 2016-01-19 NOTE — Progress Notes (Signed)
Family Medicine Teaching Service Daily Progress Note Intern Pager: [phone]  Patient name: Hailey Barnes Medical record number: [id number] Date of birth: 03-Oct-1961 Age: 55 y.o. Gender: female  Primary Care Provider: Dimas Chyle, MD Consultants: none Code Status: full  Pt Overview and Major Events to Date:  3/31 - admit for acute resp distress  Assessment and Plan:  Hailey Barnes is a 55 y.o. female presenting with SOB, cough, sputum production. PMH is significant for COPD, HFpEF, HTN, HLD, T2DM, and tobacco abuse  Respiratory distress: Most consistent with a COPD exacerbation given SOB, cough, and sputum production. May have had a viral infection leading to this exacerbation, complicated by continued tobacco use. CXR clear, BNP wnl, flu neg - Continuous pulse ox  - Duonebs q4hr, albuterol q2hr PRN  - continue home Dulera and Spiriva (does not look like these were active medications however pt reports she's still using them).  - doxycycline 162m BID (3/31>>) - start prednisone 592mdaily x4d (4/1>>) - smoking cessation  HFpEF: Last echo 8/15 with EF 65-70% with grade 1 diastolic dysfunction (previously G2DD).BNP wnl, euvolemic on exam - continue coreg, lisinopril, spironolactone - holding lasix currently - likely restart tomorrow  HTN: elevated on admission, likely 2/2 coughing fits. Soft O/N, now slightly elevated. - Continuing home Coreg, lisinopril, aldactone  Tobacco use: smokes 1ppd prior to illness  - Hold off on nicotine patch for now, consider 65m465mn future if needed.   Diabetes: CBGs fairly well controlled. Monitor while on steroids. -Hold home metformin  - sensitive SSI - CBGs ACHS   FEN/GI: heart health/carb mod diet, SLIV Prophylaxis: SQ heparin  Disposition: pending improvement in resp status, likely tomorrow  Subjective:  Reports that she is feeling somewhat better and breahting more comfortably than yesterday.  She is still getting SOB with walking  short distances. Denies CP  Objective: Temp:  [97.3 F (36.3 C)-98.1 F (36.7 C)] 97.8 F (36.6 C) (04/01 0453) Pulse Rate:  [73-105] 79 (04/01 1023) Resp:  [20-24] 20 (04/01 0500) BP: (96-161)/(60-115) 160/102 mmHg (04/01 1023) SpO2:  [93 %-99 %] 94 % (04/01 0900) Weight:  [282 lb (127.914 kg)-286 lb 13.1 oz (130.1 kg)] 286 lb 13.1 oz (130.1 kg) (04/01 0453) Physical Exam: General: sitting comfortably in bed, NAD Cardiovascular: RRR, no m/r/g Respiratory: Normal WOB, speaks in full sentences, diffuse wheezing, good air movement Abdomen: soft, NTND, +BS Extremities: WWP, no edema  Laboratory:  Recent Labs Lab 01/18/16 1811  WBC 9.9  HGB 17.4*  HCT 52.3*  PLT 214    Recent Labs Lab 01/18/16 1811  NA 140  K 3.3*  CL 100*  CO2 26  BUN 12  CREATININE 1.16*  CALCIUM 8.9  PROT 7.1  BILITOT 0.5  ALKPHOS 83  ALT 35  AST 37  GLUCOSE 132*   Flu PCR neg BNP 13  Imaging/Diagnostic Tests: X-ray Chest Pa And Lateral  01/18/2016  CLINICAL DATA:  Shortness of breath and respiratory distress. EXAM: CHEST  2 VIEW COMPARISON:  09/18/2015 FINDINGS: Lungs are hypoinflated without consolidation or effusion. Subtle density objective the spine on the lateral film likely due to prominent osteophyte formation. Cardiomediastinal silhouette and remainder the exam is unchanged. IMPRESSION: No active cardiopulmonary disease. Electronically Signed   By: DanMarin OlpD.   On: 01/18/2016 20:54     AngVirginia CrewsD 01/19/2016, 12:19 PM PGY-2, [place] pager: [phone] pages welcome

## 2016-01-20 LAB — GLUCOSE, CAPILLARY: GLUCOSE-CAPILLARY: 154 mg/dL — AB (ref 65–99)

## 2016-01-20 MED ORDER — PREDNISONE 50 MG PO TABS: 50.0000 mg | ORAL_TABLET | Freq: Every day | ORAL | Status: AC

## 2016-01-20 MED ORDER — DOXYCYCLINE HYCLATE 100 MG PO TABS: 100.0000 mg | ORAL_TABLET | Freq: Two times a day (BID) | ORAL | Status: AC

## 2016-01-20 MED ORDER — TIOTROPIUM BROMIDE MONOHYDRATE 18 MCG IN CAPS: 18.0000 ug | ORAL_CAPSULE | Freq: Every day | RESPIRATORY_TRACT | Status: DC

## 2016-01-20 MED ORDER — SPIRONOLACTONE 25 MG PO TABS: 25.0000 mg | ORAL_TABLET | Freq: Every day | ORAL | Status: DC

## 2016-01-20 NOTE — Care Management Note (Signed)
Case Management Note  Patient Details  Name: Hailey Barnes MRN: [id number] Date of Birth: 08-20-1961  Subjective/Objective:                  acute respiratory distress, COPD exacerbation Action/Plan: Discharge planning Expected Discharge Date:                  Expected Discharge Plan:  Home/Self Care  In-House Referral:     Discharge planning Services  CM Consult, Medication Assistance  Post Acute Care Choice:    Choice offered to:     DME Arranged:    DME Agency:     HH Arranged:    [place] Agency:     Status of Service:  Completed, signed off  Medicare Important Message Given:    Date Medicare IM Given:    Medicare IM give by:    Date Additional Medicare IM Given:    Additional Medicare Important Message give by:     If discussed at [place] of Stay Meetings, dates discussed:    Additional Comments: CM received consult for med asst.  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  No other CM needs were communicated. Dellie Catholic, RN 01/20/2016, 9:00 AM

## 2016-01-20 NOTE — Discharge Instructions (Signed)
Finish all doses of prednisone and doxycycline for completion of treatment for COPD exacerbation.    Chronic Obstructive Pulmonary Disease Chronic obstructive pulmonary disease (COPD) is a common lung condition in which airflow from the lungs is limited. COPD is a general term that can be used to describe many different lung problems that limit airflow, including both chronic bronchitis and emphysema. If you have COPD, your lung function will probably never return to normal, but there are measures you can take to improve lung function and make yourself feel better. CAUSES   Smoking (common).  Exposure to secondhand smoke.  Genetic problems.  Chronic inflammatory lung diseases or recurrent infections. SYMPTOMS  Shortness of breath, especially with physical activity.  Deep, persistent (chronic) cough with a large amount of thick mucus.  Wheezing.  Rapid breaths (tachypnea).  Gray or bluish discoloration (cyanosis) of the skin, especially in your fingers, toes, or lips.  Fatigue.  Weight loss.  Frequent infections or episodes when breathing symptoms become much worse (exacerbations).  Chest tightness. DIAGNOSIS Your health care provider will take a medical history and perform a physical examination to diagnose COPD. Additional tests for COPD may include:  Lung (pulmonary) function tests.  Chest X-ray.  CT scan.  Blood tests. TREATMENT  Treatment for COPD may include:  Inhaler and nebulizer medicines. These help manage the symptoms of COPD and make your breathing more comfortable.  Supplemental oxygen. Supplemental oxygen is only helpful if you have a low oxygen level in your blood.  Exercise and physical activity. These are beneficial for nearly all people with COPD.  Lung surgery or transplant.  Nutrition therapy to gain weight, if you are underweight.  Pulmonary rehabilitation. This may involve working with a team of health care providers and specialists, such as  respiratory, occupational, and physical therapists. HOME CARE INSTRUCTIONS  Take all medicines (inhaled or pills) as directed by your health care provider.  Avoid over-the-counter medicines or cough syrups that dry up your airway (such as antihistamines) and slow down the elimination of secretions unless instructed otherwise by your health care provider.  If you are a smoker, the most important thing that you can do is stop smoking. Continuing to smoke will cause further lung damage and breathing trouble. Ask your health care provider for help with quitting smoking. He or she can direct you to community resources or hospitals that provide support.  Avoid exposure to irritants such as smoke, chemicals, and fumes that aggravate your breathing.  Use oxygen therapy and pulmonary rehabilitation if directed by your health care provider. If you require home oxygen therapy, ask your health care provider whether you should purchase a pulse oximeter to measure your oxygen level at home.  Avoid contact with individuals who have a contagious illness.  Avoid extreme temperature and humidity changes.  Eat healthy foods. Eating smaller, more frequent meals and resting before meals may help you maintain your strength.  Stay active, but balance activity with periods of rest. Exercise and physical activity will help you maintain your ability to do things you want to do.  Preventing infection and hospitalization is very important when you have COPD. Make sure to receive all the vaccines your health care provider recommends, especially the pneumococcal and influenza vaccines. Ask your health care provider whether you need a pneumonia vaccine.  Learn and use relaxation techniques to manage stress.  Learn and use controlled breathing techniques as directed by your health care provider. Controlled breathing techniques include:  Pursed lip breathing. Start by  breathing in (inhaling) through your nose for 1  second. Then, purse your lips as if you were going to whistle and breathe out (exhale) through the pursed lips for 2 seconds.  Diaphragmatic breathing. Start by putting one hand on your abdomen just above your waist. Inhale slowly through your nose. The hand on your abdomen should move out. Then purse your lips and exhale slowly. You should be able to feel the hand on your abdomen moving in as you exhale.  Learn and use controlled coughing to clear mucus from your lungs. Controlled coughing is a series of short, progressive coughs. The steps of controlled coughing are: 1. Lean your head slightly forward. 2. Breathe in deeply using diaphragmatic breathing. 3. Try to hold your breath for 3 seconds. 4. Keep your mouth slightly open while coughing twice. 5. Spit any mucus out into a tissue. 6. Rest and repeat the steps once or twice as needed. SEEK MEDICAL CARE IF:  You are coughing up more mucus than usual.  There is a change in the color or thickness of your mucus.  Your breathing is more labored than usual.  Your breathing is faster than usual. SEEK IMMEDIATE MEDICAL CARE IF:  You have shortness of breath while you are resting.  You have shortness of breath that prevents you from:  Being able to talk.  Performing your usual physical activities.  You have chest pain lasting longer than 5 minutes.  Your skin color is more cyanotic than usual.  You measure low oxygen saturations for longer than 5 minutes with a pulse oximeter. MAKE SURE YOU:  Understand these instructions.  Will watch your condition.  Will get help right away if you are not doing well or get worse.   This information is not intended to replace advice given to you by your health care provider. Make sure you discuss any questions you have with your health care provider.   Document Released: 07/16/2005 Document Revised: 10/27/2014 Document Reviewed: 06/02/2013 Elsevier Interactive Patient Education [employer]

## 2016-01-20 NOTE — Progress Notes (Signed)
Pt has been discharged home with family. Pt's IV was removed with no complications. CCMD was notified and pt's telemetry box was removed. Pt received discharge instructions and all questions were answered. Pt was informed that her prescriptions were sent to her pharmacy. Pt was also informed to go to her follow-up appointments and to consider getting a sleep study done for possible sleep apnea. Pt was educated about smoking cessation and was encouraged to quit smoking. Pt left the floor via wheelchair and was accompanied by myself. Pt was transported home by her daughter. Pt was in no distress at time of discharge.   Grant Fontana RN, BSN

## 2016-01-20 NOTE — Progress Notes (Signed)
Patient has two 3.4 seconds pauses, she also brady to below 40. o2 applied at 2l . We'll continue to monitor pt.

## 2016-01-21 ENCOUNTER — Encounter (HOSPITAL_COMMUNITY): Payer: Self-pay | Admitting: Emergency Medicine

## 2016-01-21 ENCOUNTER — Emergency Department (HOSPITAL_COMMUNITY)
Admission: EM | Admit: 2016-01-21 | Discharge: 2016-01-21 | Disposition: A | Discharge status: Home / Self Care | Payer: BLUE CROSS/BLUE SHIELD | Attending: Emergency Medicine | Admitting: Emergency Medicine

## 2016-01-21 ENCOUNTER — Emergency Department (HOSPITAL_COMMUNITY): Payer: BLUE CROSS/BLUE SHIELD

## 2016-01-21 DIAGNOSIS — R21 Rash and other nonspecific skin eruption: Secondary | ICD-10-CM | POA: Diagnosis not present

## 2016-01-21 DIAGNOSIS — Y9389 Activity, other specified: Secondary | ICD-10-CM | POA: Insufficient documentation

## 2016-01-21 DIAGNOSIS — Z79899 Other long term (current) drug therapy: Secondary | ICD-10-CM | POA: Insufficient documentation

## 2016-01-21 DIAGNOSIS — Z8719 Personal history of other diseases of the digestive system: Secondary | ICD-10-CM | POA: Insufficient documentation

## 2016-01-21 DIAGNOSIS — Z7982 Long term (current) use of aspirin: Secondary | ICD-10-CM | POA: Insufficient documentation

## 2016-01-21 DIAGNOSIS — Z8673 Personal history of transient ischemic attack (TIA), and cerebral infarction without residual deficits: Secondary | ICD-10-CM | POA: Diagnosis not present

## 2016-01-21 DIAGNOSIS — S29001A Unspecified injury of muscle and tendon of front wall of thorax, initial encounter: Secondary | ICD-10-CM | POA: Diagnosis present

## 2016-01-21 DIAGNOSIS — G43909 Migraine, unspecified, not intractable, without status migrainosus: Secondary | ICD-10-CM | POA: Insufficient documentation

## 2016-01-21 DIAGNOSIS — G8929 Other chronic pain: Secondary | ICD-10-CM | POA: Diagnosis not present

## 2016-01-21 DIAGNOSIS — I509 Heart failure, unspecified: Secondary | ICD-10-CM | POA: Insufficient documentation

## 2016-01-21 DIAGNOSIS — R011 Cardiac murmur, unspecified: Secondary | ICD-10-CM | POA: Diagnosis not present

## 2016-01-21 DIAGNOSIS — Y998 Other external cause status: Secondary | ICD-10-CM | POA: Diagnosis not present

## 2016-01-21 DIAGNOSIS — Z9889 Other specified postprocedural states: Secondary | ICD-10-CM | POA: Diagnosis not present

## 2016-01-21 DIAGNOSIS — I10 Essential (primary) hypertension: Secondary | ICD-10-CM | POA: Insufficient documentation

## 2016-01-21 DIAGNOSIS — S20211A Contusion of right front wall of thorax, initial encounter: Secondary | ICD-10-CM | POA: Insufficient documentation

## 2016-01-21 DIAGNOSIS — F17210 Nicotine dependence, cigarettes, uncomplicated: Secondary | ICD-10-CM | POA: Diagnosis not present

## 2016-01-21 DIAGNOSIS — M1990 Unspecified osteoarthritis, unspecified site: Secondary | ICD-10-CM | POA: Insufficient documentation

## 2016-01-21 DIAGNOSIS — Z7951 Long term (current) use of inhaled steroids: Secondary | ICD-10-CM | POA: Insufficient documentation

## 2016-01-21 DIAGNOSIS — Y92410 Unspecified street and highway as the place of occurrence of the external cause: Secondary | ICD-10-CM | POA: Diagnosis not present

## 2016-01-21 DIAGNOSIS — Z7984 Long term (current) use of oral hypoglycemic drugs: Secondary | ICD-10-CM | POA: Insufficient documentation

## 2016-01-21 DIAGNOSIS — J441 Chronic obstructive pulmonary disease with (acute) exacerbation: Secondary | ICD-10-CM | POA: Diagnosis not present

## 2016-01-21 MED ORDER — IBUPROFEN 800 MG PO TABS: 800.0000 mg | ORAL_TABLET | Freq: Three times a day (TID) | ORAL | Status: DC
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
## 2016-01-21 NOTE — Discharge Instructions (Signed)
Xrays are normal Motrin as needed Ice for swelling / rash See your doctor in 2 days for recheck. Back to ER if increased shortness of breath or pain

## 2016-01-22 ENCOUNTER — Ambulatory Visit (INDEPENDENT_AMBULATORY_CARE_PROVIDER_SITE_OTHER): Payer: BLUE CROSS/BLUE SHIELD | Admitting: Family Medicine

## 2016-01-22 ENCOUNTER — Encounter: Payer: Self-pay | Admitting: Family Medicine

## 2016-01-22 ENCOUNTER — Inpatient Hospital Stay: Payer: BLUE CROSS/BLUE SHIELD | Admitting: Family Medicine

## 2016-01-22 VITALS — BP 181/119 | HR 80 | Temp 97.6°F | Wt 284.0 lb

## 2016-01-22 DIAGNOSIS — J441 Chronic obstructive pulmonary disease with (acute) exacerbation: Secondary | ICD-10-CM

## 2016-01-22 DIAGNOSIS — I10 Essential (primary) hypertension: Secondary | ICD-10-CM

## 2016-01-22 MED ORDER — CARVEDILOL 25 MG PO TABS: 25.0000 mg | ORAL_TABLET | Freq: Two times a day (BID) | ORAL | Status: DC

## 2016-01-22 MED ORDER — LISINOPRIL 10 MG PO TABS: 20.0000 mg | ORAL_TABLET | Freq: Every day | ORAL | Status: DC

## 2016-01-22 MED ORDER — E-Z SPACER DEVI: Status: DC
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
## 2016-01-22 NOTE — Patient Instructions (Signed)
It was great seeing you today.   1. I have ordered a sleep study to evaluate your low oxygen overnight.  Hopefully can have this scheduled in the next 2-4 weeks.  2. Complete your steroids and antibiotics as prescribed 3. I ordered spacer.  Please use this with all inhalers.    Please bring all your medications to every doctors visit  Sign up for My Chart to have easy access to your labs results, and communication with your Primary care physician.  Next Appointment  Please make an appointment with Dr Valentina Lucks in 1-3 weeks for help quitting smoking and high blood pressure  I look forward to talking with you again at our next visit. If you have any questions or concerns before then, please call the clinic at [phone].  Take Care,   Dr Phill Myron

## 2016-01-22 NOTE — Progress Notes (Signed)
Utilization review completed- post discharge
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
## 2016-01-22 NOTE — Assessment & Plan Note (Signed)
Improving.  Normal respiratory effort.  Mild end expiratory wheezing - Complete antibiotics and steroids as scheduled - Ordered spacer.  Advised to use with all MDIs - Follow-up with Dr. Valentina Lucks for smoking cessation

## 2016-01-24 ENCOUNTER — Ambulatory Visit (INDEPENDENT_AMBULATORY_CARE_PROVIDER_SITE_OTHER): Payer: BLUE CROSS/BLUE SHIELD | Admitting: Cardiology

## 2016-01-24 ENCOUNTER — Encounter: Payer: Self-pay | Admitting: Cardiology

## 2016-01-24 VITALS — BP 136/74 | HR 80 | Ht 64.0 in | Wt 287.0 lb

## 2016-01-24 DIAGNOSIS — E785 Hyperlipidemia, unspecified: Secondary | ICD-10-CM | POA: Diagnosis not present

## 2016-01-24 DIAGNOSIS — R0602 Shortness of breath: Secondary | ICD-10-CM | POA: Diagnosis not present

## 2016-01-24 DIAGNOSIS — Z79899 Other long term (current) drug therapy: Secondary | ICD-10-CM

## 2016-01-24 DIAGNOSIS — J438 Other emphysema: Secondary | ICD-10-CM

## 2016-01-24 DIAGNOSIS — I10 Essential (primary) hypertension: Secondary | ICD-10-CM

## 2016-01-24 DIAGNOSIS — I5032 Chronic diastolic (congestive) heart failure: Secondary | ICD-10-CM | POA: Diagnosis not present

## 2016-01-24 MED ORDER — ATORVASTATIN CALCIUM 10 MG PO TABS: 10.0000 mg | ORAL_TABLET | Freq: Every day | ORAL | Status: DC

## 2016-01-24 MED ORDER — FISH OIL 1000 MG PO CAPS: 2000.0000 mg | ORAL_CAPSULE | Freq: Every day | ORAL | Status: DC
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
## 2016-01-24 NOTE — Patient Instructions (Signed)
Medication Instructions:  Please start Atorvastatin 10 mg a day. Start Fish Oil 1,000 mg twice a day.  These can be purchased over the counter at your pharmacy. Continue all other medications as listed.  Labwork: Please have lab work in 2 months (Lipid/ALT)  Follow-Up: Follow up in 2 months with Dr Marlou Porch.   If you need a refill on your cardiac medications before your next appointment, please call your pharmacy.  Thank you for choosing [facility]!!

## 2016-02-05 ENCOUNTER — Other Ambulatory Visit: Payer: Self-pay | Admitting: Family Medicine

## 2016-02-06 NOTE — Telephone Encounter (Signed)
Rx filled.  [name]. [name], [place] Resident PGY-2 02/06/2016 1:16 PM

## 2016-02-07 ENCOUNTER — Ambulatory Visit (INDEPENDENT_AMBULATORY_CARE_PROVIDER_SITE_OTHER): Payer: BLUE CROSS/BLUE SHIELD | Admitting: Pharmacist

## 2016-02-07 ENCOUNTER — Encounter: Payer: Self-pay | Admitting: Pharmacist

## 2016-02-07 VITALS — BP 143/77 | HR 74 | Wt 290.0 lb

## 2016-02-07 DIAGNOSIS — E785 Hyperlipidemia, unspecified: Secondary | ICD-10-CM | POA: Diagnosis not present

## 2016-02-07 DIAGNOSIS — I10 Essential (primary) hypertension: Secondary | ICD-10-CM

## 2016-02-07 DIAGNOSIS — F17200 Nicotine dependence, unspecified, uncomplicated: Secondary | ICD-10-CM

## 2016-02-07 MED ORDER — NORTRIPTYLINE HCL 25 MG PO CAPS: 25.0000 mg | ORAL_CAPSULE | Freq: Every day | ORAL | Status: DC

## 2016-02-07 MED ORDER — ATORVASTATIN CALCIUM 40 MG PO TABS: 40.0000 mg | ORAL_TABLET | Freq: Every day | ORAL | Status: DC

## 2016-02-07 MED ORDER — SPIRONOLACTONE 25 MG PO TABS: 25.0000 mg | ORAL_TABLET | Freq: Every day | ORAL | Status: DC

## 2016-02-07 MED ORDER — NICOTINE POLACRILEX 2 MG MT LOZG: 2.0000 mg | LOZENGE | OROMUCOSAL | Status: DC | PRN

## 2016-02-07 NOTE — Progress Notes (Signed)
Patient ID: Hailey Barnes, female   DOB: Jul 26, 1961, 55 y.o.   MRN: [id number] Reviewed: Agree with the documentation and management of Dr. Valentina Lucks.

## 2016-02-07 NOTE — Assessment & Plan Note (Signed)
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  Initiated nortriptyline therapy.  Instructed patient to take 25 mg (1 capsule) at bedtime for one week then increase to 50 mg (2 capsules) at bedtime.  Initiated nicotine replacement tx with 2 mg nicotine lozenges. Patient counseled on purpose, proper use, and potential adverse effects.  Patient set quit date for Friday 02/15/16.

## 2016-02-07 NOTE — Assessment & Plan Note (Signed)
Hypertension:  Blood pressure not at goal.  Patient reports running out of her spironolactone 2 days ago.  Refilled spironolactone.  Patient is currently prescribed ibuprofen and meloxicam.  Advised patient to avoid taking two NSAIDs.  Discontinued ibuprofen.

## 2016-02-07 NOTE — Assessment & Plan Note (Signed)
Hyperlipidemia:  Patient denies any adverse effects since initiation of atorvastatin 10 mg daily.  Patient reports she is unable to tolerate fish oil.  Will increase atorvastatin to high intensity atorvastatin 40 mg daily for ASCVD risk greater than 7.5%.  Discontinued fish oil.

## 2016-02-07 NOTE — Patient Instructions (Signed)
Thank you for coming in today!  Start taking nortriptyline 25 mg daily at bedtime for one week, then increase dose to 50 mg daily (2 capsules) at bedtime.    Quit date set for Friday, 02/15/16!!   Use nicotine lozenges as needed for nicotine cravings.    I sent in a refill of your spironolactone, please restart this medication.    I am increasing your atorvastatin to 40 mg daily.  A new prescription was sent to [facility].   I will call you in one week.  Please schedule an appointment to see [place] again in two weeks.

## 2016-02-07 NOTE — Progress Notes (Signed)
S:  Patient arrives in good spirits, ambulating without assistance.  Patient arrives for evaluation/assistance with tobacco dependence.  Patient was referred on 01/22/16 by Dr. Berkley Harvey.  Patient was last seen by Primary Care Provider on 12/07/15.   Age when started using tobacco on a daily basis 55yo. Number of Cigarettes per day 20. Brand smoked [place]. Estimated Nicotine Content per Cigarette (mg) 1.2  Estimated Nicotine intake per day 18m.   Smokes first cigarette less than 5 minutes after waking. Smokes several times per night.     Most recent quit attempt 10 years ago Longest time ever been tobacco free 6 months.  Medications (NRT, bupropion, varenicline) used in prior in past cessation efforts include: Chantix, nicotine gum and patches (reports using 14 mg patches and had nausea)   Rates IMPORTANCE of quitting tobacco on 1-10 scale of 10. Rates CONFIDENCE of quitting tobacco on 1-10 scale of 10.  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  Initiated nortriptyline therapy.  Instructed patient to take 25 mg (1 capsule) at bedtime for one week then increase to 50 mg (2 capsules) at bedtime.  Initiated nicotine replacement tx with 2 mg nicotine lozenges. Patient counseled on purpose, proper use, and potential adverse effects.  Patient set quit date for Friday 02/15/16.    Hypertension:  Blood pressure not at goal.  Patient reports running out of her spironolactone 2 days ago.  Refilled spironolactone.  Patient is currently prescribed ibuprofen and meloxicam.  Advised patient to avoid taking two NSAIDs.  Discontinued ibuprofen.    Hyperlipidemia:  Patient denies any adverse effects since initiation of  atorvastatin 10 mg daily.  Patient reports she is unable to tolerate fish oil.  Will increase atorvastatin to high intensity atorvastatin 40 mg daily for ASCVD risk greater than 7.5%.  Discontinued fish oil.    Written information provided. Provided information on 1 800-QUIT NOW support program.  F/U phone call in one week.  F/U Rx Clinic Visit in 2 weeks.  Total time in face-to-face counseling 30 minutes.  Patient seen with PRudolpho Sevin PharmD Candidate and RElisabeth Most PharmD Resident.

## 2016-02-11 ENCOUNTER — Telehealth: Payer: Self-pay | Admitting: Family Medicine

## 2016-02-11 DIAGNOSIS — F17200 Nicotine dependence, unspecified, uncomplicated: Secondary | ICD-10-CM

## 2016-02-11 NOTE — Telephone Encounter (Signed)
Pt is calling because we were suppose to send in a prescription of Nortriptyline on 02/07/16, It shows in Epic as a print for that medication. Can we call this in for her since she already picked up other medications. jw

## 2016-02-12 NOTE — Telephone Encounter (Signed)
LM for presciption on pharmacy line. Shelbey Spindler,CMA

## 2016-02-18 DIAGNOSIS — G4730 Sleep apnea, unspecified: Secondary | ICD-10-CM

## 2016-02-18 HISTORY — DX: Sleep apnea, unspecified: G47.30

## 2016-02-20 ENCOUNTER — Encounter: Payer: Self-pay | Admitting: Cardiology

## 2016-02-21 ENCOUNTER — Telehealth: Payer: Self-pay | Admitting: Pharmacist

## 2016-02-21 ENCOUNTER — Ambulatory Visit: Payer: BLUE CROSS/BLUE SHIELD | Admitting: Pharmacist

## 2016-02-21 NOTE — Telephone Encounter (Signed)
S:  Phone call to patient to follow up regarding tobacco cessation.    Patient reports she has reduced number of cigarettes from 1 pack per day to 5 cigarettes per day.    Patient reports she had difficulty obtaining medication from the pharmacy following visit.  Patient reports she started nortriptyline 25 mg three days ago.     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  Patient started nortriptyline therapy with 25 mg daily on 02/18/16.  Patient denies adverse events.  Instructed patient to increase to 50 mg (2 capsules) at bedtime after one week (02/25/16).  Patient reports she changed quit date to Friday, 02/29/16.  Scheduled follow up visit in pharmacy clinic on on 03/06/16.

## 2016-03-03 ENCOUNTER — Encounter (HOSPITAL_BASED_OUTPATIENT_CLINIC_OR_DEPARTMENT_OTHER): Payer: BLUE CROSS/BLUE SHIELD

## 2016-03-06 ENCOUNTER — Ambulatory Visit: Payer: BLUE CROSS/BLUE SHIELD | Admitting: Pharmacist

## 2016-03-18 ENCOUNTER — Ambulatory Visit (INDEPENDENT_AMBULATORY_CARE_PROVIDER_SITE_OTHER): Payer: BLUE CROSS/BLUE SHIELD | Admitting: Family Medicine

## 2016-03-18 ENCOUNTER — Other Ambulatory Visit: Payer: Self-pay | Admitting: Family Medicine

## 2016-03-18 ENCOUNTER — Other Ambulatory Visit (HOSPITAL_COMMUNITY)
Admission: RE | Admit: 2016-03-18 | Discharge: 2016-03-18 | Disposition: A | Discharge status: Home / Self Care | Payer: BLUE CROSS/BLUE SHIELD | Source: Ambulatory Visit | Attending: Family Medicine | Admitting: Family Medicine

## 2016-03-18 ENCOUNTER — Encounter: Payer: Self-pay | Admitting: Family Medicine

## 2016-03-18 VITALS — BP 150/65 | HR 85 | Temp 97.7°F | Wt 289.0 lb

## 2016-03-18 DIAGNOSIS — N898 Other specified noninflammatory disorders of vagina: Secondary | ICD-10-CM | POA: Diagnosis not present

## 2016-03-18 DIAGNOSIS — N939 Abnormal uterine and vaginal bleeding, unspecified: Secondary | ICD-10-CM | POA: Diagnosis not present

## 2016-03-18 DIAGNOSIS — Z780 Asymptomatic menopausal state: Secondary | ICD-10-CM | POA: Insufficient documentation

## 2016-03-18 DIAGNOSIS — I10 Essential (primary) hypertension: Secondary | ICD-10-CM | POA: Diagnosis not present

## 2016-03-18 DIAGNOSIS — Z202 Contact with and (suspected) exposure to infections with a predominantly sexual mode of transmission: Secondary | ICD-10-CM | POA: Diagnosis not present

## 2016-03-18 DIAGNOSIS — Z113 Encounter for screening for infections with a predominantly sexual mode of transmission: Secondary | ICD-10-CM | POA: Diagnosis not present

## 2016-03-18 DIAGNOSIS — N760 Acute vaginitis: Secondary | ICD-10-CM

## 2016-03-18 DIAGNOSIS — N950 Postmenopausal bleeding: Secondary | ICD-10-CM

## 2016-03-18 LAB — HIV ANTIBODY (ROUTINE TESTING W REFLEX): HIV: NONREACTIVE

## 2016-03-18 LAB — POCT WET PREP (WET MOUNT): CLUE CELLS WET PREP WHIFF POC: POSITIVE

## 2016-03-18 MED ORDER — CARVEDILOL 25 MG PO TABS: 25.0000 mg | ORAL_TABLET | Freq: Two times a day (BID) | ORAL | Status: DC

## 2016-03-18 MED ORDER — TIOTROPIUM BROMIDE MONOHYDRATE 18 MCG IN CAPS: 18.0000 ug | ORAL_CAPSULE | Freq: Every day | RESPIRATORY_TRACT | Status: DC

## 2016-03-18 MED ORDER — LISINOPRIL 10 MG PO TABS: 20.0000 mg | ORAL_TABLET | Freq: Every day | ORAL | Status: DC

## 2016-03-18 MED ORDER — METRONIDAZOLE 500 MG PO TABS: 500.0000 mg | ORAL_TABLET | Freq: Two times a day (BID) | ORAL | Status: DC

## 2016-03-18 MED ORDER — SPIRONOLACTONE 25 MG PO TABS: 25.0000 mg | ORAL_TABLET | Freq: Every day | ORAL | Status: DC

## 2016-03-18 NOTE — Assessment & Plan Note (Signed)
Medication refilled. F/U with PCP in 2-3 wks for lab monitoring based on meds she is on.

## 2016-03-18 NOTE — Assessment & Plan Note (Signed)
Recommend use of condom regularly. HIV, RPR, GC and chlamydia checked today. Trich positive and prescription given to treat. I will call her with result.

## 2016-03-18 NOTE — Patient Instructions (Signed)
Vaginitis Vaginitis is an inflammation of the vagina. It can happen when the normal bacteria and yeast in the vagina grow too much. There are different types. Treatment will depend on the type you have. HOME CARE  Take all medicines as told by your doctor.  Keep your vagina area clean and dry. Avoid soap. Rinse the area with water.  Avoid washing and cleaning out the vagina (douching).  Do not use tampons or have sex (intercourse) until your treatment is done.  Wipe from front to back after going to the restroom.  Wear cotton underwear.  Avoid wearing underwear while you sleep until your vaginitis is gone.  Avoid tight pants. Avoid underwear or nylons without a cotton panel.  Take off wet clothing (such as a bathing suit) as soon as you can.  Use mild, unscented products. Avoid fabric softeners and scented:  Feminine sprays.  Laundry detergents.  Tampons.  Soaps or bubble baths.  Practice safe sex and use condoms. GET HELP RIGHT AWAY IF:   You have belly (abdominal) pain.  You have a fever or lasting symptoms for more than 2-3 days.  You have a fever and your symptoms suddenly get worse. MAKE SURE YOU:   Understand these instructions.  Will watch this condition.  Will get help right away if you are not doing well or get worse.   This information is not intended to replace advice given to you by your health care provider. Make sure you discuss any questions you have with your health care provider.   Document Released: 01/02/2009 Document Revised: 06/30/2012 Document Reviewed: 03/18/2012 Elsevier Interactive Patient Education [employer].

## 2016-03-18 NOTE — Assessment & Plan Note (Signed)
Wet prep done positive for Trich and BV. Result discussed with patient. I recommended that her partner get tested for Tricho and treated to prevent reinfection. She agreed with plan. Metronidazole prescribed for both infection. Retesting recommended in 2-3 wks. She agreed with plan.

## 2016-03-18 NOTE — Assessment & Plan Note (Signed)
Etiology unclear. No recent work up. Pelvic U/S ordered. I will contact her with result.

## 2016-03-18 NOTE — Progress Notes (Signed)
Subjective:     Patient ID: Hailey Barnes, female   DOB: Dec 01, 1960, 55 y.o.   MRN: [id number]  Vaginal Discharge The patient's primary symptoms include a genital odor, pelvic pain, vaginal bleeding and vaginal discharge. The patient's pertinent negatives include no genital itching or missed menses. Primary symptoms comment: vaginal discharge x 1 week. This is a new problem. The current episode started 1 to 4 weeks ago. The problem occurs constantly. The problem has been unchanged. The pain is mild (Right LQ pain). The problem affects the right side. She is not pregnant. Associated symptoms include abdominal pain and painful intercourse. Pertinent negatives include no anorexia, constipation, dysuria, fever, flank pain, frequency, headaches, nausea, rash or vomiting. Associated symptoms comments: +Bleeding with intercourse and vaginal discharge.  Vaginal Bleeding The patient's primary symptoms include a genital odor, pelvic pain, vaginal bleeding and vaginal discharge. The patient's pertinent negatives include no genital itching or missed menses. Primary symptoms comment: vaginal discharge x 1 week. This is a recurrent problem. Episode onset: She normally have postcoital bleed for years but now she bleeds intermittently without sex. This morning she noticed brownish discoloration on tissue wipe. The problem occurs daily. The problem has been waxing and waning. The pain is mild. The problem affects the right (Right flank pain) side. She is not pregnant. Associated symptoms include abdominal pain and painful intercourse. Pertinent negatives include no anorexia, constipation, dysuria, fever, flank pain, frequency, headaches, nausea, rash or vomiting. The vaginal bleeding is lighter than menses. She has not been passing clots. She has not been passing tissue. Nothing aggravates the symptoms. She has tried nothing for the symptoms. She is sexually active. It is unknown (She will like STD check) whether or not her  partner has an STD. She uses condoms (Been non compliant with condoms) for contraception.  Exposure to STD  The patient's primary symptoms include a discharge and pelvic pain. The patient's pertinent negatives include no dysuria or genital itching. Primary symptoms comment: vaginal discharge x 1 week. This is a new problem. The vaginal discharge was white and thick. Associate symptoms include abdominal pain and a genital odor. Pertinent negatives include no anorexia, fever or urinary frequency. She has tried nothing for the symptoms. The treatment provided no relief. Risk factors: New partner and has recently stopped using condoms.  Med refill: Need some of her meds refilled.  Current Outpatient Prescriptions on File Prior to Visit  Medication Sig Dispense Refill  . albuterol (PROVENTIL HFA;VENTOLIN HFA) 108 (90 BASE) MCG/ACT inhaler Inhale 2 puffs into the lungs 2 (two) times daily. (Patient taking differently: Inhale 2 puffs into the lungs every 6 (six) hours as needed. ) 2 Inhaler 1  . aspirin 81 MG tablet Take 81 mg by mouth daily.    Marland Kitchen atorvastatin (LIPITOR) 40 MG tablet Take 1 tablet (40 mg total) by mouth daily. 90 tablet 1  . carvedilol (COREG) 25 MG tablet Take 1 tablet (25 mg total) by mouth 2 (two) times daily with a meal. 60 tablet 0  . furosemide (LASIX) 40 MG tablet TAKE THREE TABLETS BY MOUTH TWICE DAILY (Patient taking differently: TAKE four TABLETS BY MOUTH DAILY) 90 tablet 0  . gabapentin (NEURONTIN) 300 MG capsule Take 300 mg by mouth at bedtime.  0  . hydrocortisone 1 % ointment Apply 1 application topically 2 (two) times daily as needed (eczema on face).    Marland Kitchen lisinopril (PRINIVIL,ZESTRIL) 10 MG tablet Take 2 tablets (20 mg total) by mouth daily. 30 tablet 2  .  meloxicam (MOBIC) 15 MG tablet Take 15 mg by mouth daily as needed.  0  . metFORMIN (GLUCOPHAGE) 500 MG tablet Take 1 tablet (500 mg total) by mouth 2 (two) times daily with a meal. 60 tablet 3  . methocarbamol (ROBAXIN)  500 MG tablet Take 500 mg by mouth every 8 (eight) hours as needed for muscle spasms. Reported on 02/07/2016  0  . mometasone-formoterol (DULERA) 200-5 MCG/ACT AERO Inhale 2 puffs into the lungs 2 (two) times daily.    . nicotine polacrilex (COMMIT) 2 MG lozenge Take 1 lozenge (2 mg total) by mouth as needed for smoking cessation. 100 tablet 0  . nortriptyline (PAMELOR) 25 MG capsule Take 1 capsule (25 mg total) by mouth at bedtime. Take 1 capsule (25 mg total) by mouth at bedtime for one week then increase to 2 capsules (50 mg total) by mouth at bedtime. 53 capsule 0  . Spacer/Aero-Holding Chambers (E-Z SPACER) inhaler Use as instructed 1 each 2  . spironolactone (ALDACTONE) 25 MG tablet Take 1 tablet (25 mg total) by mouth daily. 30 tablet 0  . tiotropium (SPIRIVA) 18 MCG inhalation capsule Place 1 capsule (18 mcg total) into inhaler and inhale daily. 30 capsule 0  . triamcinolone cream (KENALOG) 0.1 % Apply 1 application topically 3 (three) times daily as needed (eczema on arms).    . [DISCONTINUED] loratadine (CLARITIN) 10 MG tablet Take 1 tablet (10 mg total) by mouth daily. (Patient not taking: Reported on 06/14/2015) 30 tablet 11  . [DISCONTINUED] omeprazole (PRILOSEC) 20 MG capsule Take 1 capsule (20 mg total) by mouth daily. (Patient not taking: Reported on 07/05/2015) 30 capsule 0  . [DISCONTINUED] sertraline (ZOLOFT) 50 MG tablet Take 1 tablet (50 mg total) by mouth daily. (Patient not taking: Reported on 06/14/2015) 30 tablet 3   No current facility-administered medications on file prior to visit.   Past Medical History  Diagnosis Date  . 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   Marland Kitchen Hypertension   . COPD (chronic obstructive pulmonary disease) ([place])   . Tingling in extremities 12/12/2010    Uric acid and electrolytes (02/23) normal but WBC elevated.   D/Dx: carpal tunnel, ulnar neuropathy Less likely: cervical radiculopathy, vasculitis   . CHF (congestive heart  failure) ([place])   . Chronic bronchitis ([place])     "get it q yr" (02/21/2014)  . Arthritis     "knees" (02/21/2014)  . Complication of anesthesia     "I don't come out well; I chew on my tongue"  . Heart murmur   . GERD (gastroesophageal reflux disease)   . Migraine 1982-2009  . Stroke [facility]) noted on CAT 02/2014    "light", LLE weakness remains (03/30/2014)  . Chronic lower back pain   . Shortness of breath       Review of Systems  Constitutional: Negative for fever.  Respiratory: Negative.   Cardiovascular: Negative.   Gastrointestinal: Positive for abdominal pain. Negative for nausea, vomiting, constipation and anorexia.  Genitourinary: Positive for vaginal bleeding, vaginal discharge and pelvic pain. Negative for dysuria, frequency, flank pain and missed menses.  Skin: Negative for rash.  Neurological: Negative for headaches.  All other systems reviewed and are negative.  Filed Vitals:   03/18/16 1054  BP: 150/65  Pulse: 85  Temp: 97.7 F (36.5 C)  TempSrc: Oral  Weight: 289 lb (131.09 kg)  SpO2: 96%       Objective:   Physical Exam  Constitutional: She appears well-developed. No distress.  Cardiovascular: Normal rate, regular rhythm and normal heart sounds.   No murmur heard. Pulmonary/Chest: Effort normal and breath sounds normal. No respiratory distress. She has no wheezes. She exhibits no tenderness.  Abdominal: Soft. Bowel sounds are normal. She exhibits no distension and no mass. There is no tenderness. There is no rebound and no guarding.  Genitourinary: Uterus normal. There is no rash or tenderness on the right labia. There is no rash or tenderness on the left labia. Cervix exhibits no motion tenderness, no discharge and no friability. Right adnexum displays no mass. Left adnexum displays no mass. Vaginal discharge found.  Musculoskeletal: Normal range of motion. She exhibits no edema.  Nursing note and vitals reviewed.      Assessment:     Vaginitis Vaginal  bleed STD exposure Med refill     Plan:     Check problem list.

## 2016-03-19 ENCOUNTER — Telehealth: Payer: Self-pay | Admitting: *Deleted

## 2016-03-19 ENCOUNTER — Telehealth: Payer: Self-pay | Admitting: Family Medicine

## 2016-03-19 LAB — CERVICOVAGINAL ANCILLARY ONLY
Chlamydia: NEGATIVE
Neisseria Gonorrhea: NEGATIVE

## 2016-03-19 LAB — RPR

## 2016-03-19 NOTE — Telephone Encounter (Signed)
Neg HIV and syphilis test discussed with her. GC/Chlamydia report is still pending.

## 2016-03-19 NOTE — Telephone Encounter (Signed)
Received fax from [facility] needing clarification on the quantity and days supply for the Rx furosemide: TAKE THREE TABLETS BY MOUTH TWICE DAILY, #90.  Derl Barrow, RN

## 2016-03-19 NOTE — Telephone Encounter (Signed)
Message left to call back.   Note: GC/Chlamydia neg.

## 2016-03-19 NOTE — Telephone Encounter (Signed)
Rx filled.  [name]. [name], [place] Resident PGY-2 03/19/2016 10:29 AM

## 2016-03-20 ENCOUNTER — Telehealth: Payer: Self-pay | Admitting: Family Medicine

## 2016-03-20 MED ORDER — FUROSEMIDE 40 MG PO TABS: 120.0000 mg | ORAL_TABLET | Freq: Two times a day (BID) | ORAL | Status: DC

## 2016-03-20 NOTE — Telephone Encounter (Signed)
Would like test results.

## 2016-03-20 NOTE — Telephone Encounter (Signed)
New Rx sent if for #180.  [name]. [name], [place] Resident PGY-2 03/20/2016 11:35 AM

## 2016-03-21 ENCOUNTER — Ambulatory Visit (HOSPITAL_COMMUNITY)
Admission: RE | Admit: 2016-03-21 | Discharge: 2016-03-21 | Disposition: A | Discharge status: Home / Self Care | Payer: BLUE CROSS/BLUE SHIELD | Source: Ambulatory Visit | Attending: Family Medicine | Admitting: Family Medicine

## 2016-03-21 DIAGNOSIS — N939 Abnormal uterine and vaginal bleeding, unspecified: Secondary | ICD-10-CM | POA: Diagnosis present

## 2016-03-21 DIAGNOSIS — Z780 Asymptomatic menopausal state: Secondary | ICD-10-CM | POA: Diagnosis not present

## 2016-03-21 NOTE — Telephone Encounter (Signed)
Patient is aware of results. Annsley Akkerman,CMA

## 2016-03-24 ENCOUNTER — Telehealth: Payer: Self-pay | Admitting: Family Medicine

## 2016-03-24 DIAGNOSIS — R9389 Abnormal findings on diagnostic imaging of other specified body structures: Secondary | ICD-10-CM

## 2016-03-24 DIAGNOSIS — N950 Postmenopausal bleeding: Secondary | ICD-10-CM

## 2016-03-24 NOTE — Telephone Encounter (Signed)
I discussed her pelvic U/S report with her. Endometrial thickness 14.7cm, definitely >60m in this postmenopausal woman presenting with post menopausal and post coital bleed. I recommended endometrial biopsy. We could have done it in our clinic but from her last pelvic exam last week I was unable to locate her cervix. I prefer to refer her to Gyn. She agreed with plan. All questions answered.  Note I am guessing the 14.7 cm is a typo. Likely 14.778minstead, but either way [facility]

## 2016-03-31 ENCOUNTER — Ambulatory Visit: Payer: BLUE CROSS/BLUE SHIELD | Admitting: Cardiology

## 2016-03-31 ENCOUNTER — Other Ambulatory Visit: Payer: BLUE CROSS/BLUE SHIELD

## 2016-03-31 ENCOUNTER — Ambulatory Visit (HOSPITAL_BASED_OUTPATIENT_CLINIC_OR_DEPARTMENT_OTHER): Payer: BLUE CROSS/BLUE SHIELD

## 2016-04-04 ENCOUNTER — Ambulatory Visit (INDEPENDENT_AMBULATORY_CARE_PROVIDER_SITE_OTHER): Payer: BLUE CROSS/BLUE SHIELD | Admitting: Gynecology

## 2016-04-04 ENCOUNTER — Encounter: Payer: Self-pay | Admitting: Gynecology

## 2016-04-04 VITALS — BP 142/90

## 2016-04-04 DIAGNOSIS — N930 Postcoital and contact bleeding: Secondary | ICD-10-CM

## 2016-04-04 DIAGNOSIS — N841 Polyp of cervix uteri: Secondary | ICD-10-CM

## 2016-04-04 DIAGNOSIS — Z01419 Encounter for gynecological examination (general) (routine) without abnormal findings: Secondary | ICD-10-CM | POA: Diagnosis not present

## 2016-04-04 DIAGNOSIS — Z113 Encounter for screening for infections with a predominantly sexual mode of transmission: Secondary | ICD-10-CM

## 2016-04-04 DIAGNOSIS — D259 Leiomyoma of uterus, unspecified: Secondary | ICD-10-CM

## 2016-04-04 DIAGNOSIS — Z8619 Personal history of other infectious and parasitic diseases: Secondary | ICD-10-CM

## 2016-04-04 DIAGNOSIS — Z8742 Personal history of other diseases of the female genital tract: Secondary | ICD-10-CM | POA: Diagnosis not present

## 2016-04-04 NOTE — Progress Notes (Signed)
Hailey Barnes [birth year] [id number]   History:    55 y.o.  for annual gyn exam is a new patient to the practice who she stated she has not seen a gynecologist for 10 years although at the [facility] family practice clinic may have been following her for her hypertension and type 2 diabetes. Patient also many years ago was a crack cocaine addict and she's been free from drugs for several years now. She was seen at the family practice clinic approximate 2 weeks ago and was treated for trichomoniasis. Review of her labs indicated that she had a negative RPR and HIV and GC and Chlamydia culture back pain. She had been complaining of postcoital bleeding when she was sexually active. She does report many years ago she's had abdominal myomectomy and C-sections as well. She has never been on a hormonal replacement therapy she stated her last menstrual period was about 6 months ago. When she was sexually active she use condoms on and off. She has not had a mammogram since age 79. She reports some form of abnormal Pap smear in the past but does not recall any specific treatment we have no records of that.. She has not had a colonoscopy.  Patient's PCP had ordered an ultrasound due to patient's vaginal bleeding and the following was reported by the radiologist: IMPRESSION: 1. The endometrial stripe measures 14.7 cm. This is prominent but within normal limits for a patient not yet in menopause. This would be abnormal after menopause however. Recommend follow-up as clinically warranted. 2. There is a simple cystic follicle in the left ovary requiring no additional follow-up. 3. The tubular fluid-filled structure the right adnexum could be a hydrosalpinx.  Past medical history,surgical history, family history and social history were all reviewed and documented in the EPIC chart.  Gynecologic History Patient's last menstrual period was 08/31/2015. Contraception: none Last Pap: 2015. Results were:  normal Last mammogram: H 40. Results were: normal  Obstetric History OB History  Gravida Para Term Preterm AB SAB TAB Ectopic Multiple Living  1 1        1     # Outcome Date GA Lbr Len/2nd Weight Sex Delivery Anes PTL Lv  1 Para                ROS: A ROS was performed and pertinent positives and negatives are included in the history.  GENERAL: No fevers or chills. HEENT: No change in vision, no earache, sore throat or sinus congestion. NECK: No pain or stiffness. CARDIOVASCULAR: No chest pain or pressure. No palpitations. PULMONARY: No shortness of breath, cough or wheeze. GASTROINTESTINAL: No abdominal pain, nausea, vomiting or diarrhea, melena or bright red blood per rectum. GENITOURINARY: No urinary frequency, urgency, hesitancy or dysuria. MUSCULOSKELETAL: No joint or muscle pain, no back pain, no recent trauma. DERMATOLOGIC: No rash, no itching, no lesions. ENDOCRINE: No polyuria, polydipsia, no heat or cold intolerance. No recent change in weight. HEMATOLOGICAL: No anemia or easy bruising or bleeding. NEUROLOGIC: No headache, seizures, numbness, tingling or weakness. PSYCHIATRIC: No depression, no loss of interest in normal activity or change in sleep pattern.     Exam: chaperone present  BP 142/90 mmHg  LMP 08/31/2015  There is no weight on file to calculate BMI.  General appearance : Well developed well nourished female. No acute distress HEENT: Eyes: no retinal hemorrhage or exudates,  Neck supple, trachea midline, no carotid bruits, no thyroidmegaly Lungs: Clear to auscultation, no rhonchi or wheezes, or rib  retractions  Heart: Regular rate and rhythm, no murmurs or gallops Breast:Examined in sitting and supine position were symmetrical in appearance, no palpable masses or tenderness,  no skin retraction, no nipple inversion, no nipple discharge, no skin discoloration, no axillary or supraclavicular lymphadenopathy Abdomen: no palpable masses or tenderness, no rebound or  guarding Extremities: no edema or skin discoloration or tenderness  Pelvic:  Bartholin, Urethra, Skene Glands: Within normal limits             Vagina: No gross lesions or discharge  Cervix: Cervical polyp  Uterus  difficult to assess due to patient's abdominal girth and pendulous abdomen,  Adnexa  same as above  Anus and perineum  normal   Rectovaginal  normal sphincter tone without palpated masses or tenderness             Hemoccult will need to schedule colonoscopy     Assessment/Plan:  55 y.o. female for annual exam with multiple comorbidities, obesity, hypertension, hyperlipidemia, type 2 diabetes recently treated for trichomoniasis. A GC and Chlamydia culture along with hepatitis B and C was obtained today. A Pap smear with [place] HPV screening was done today. Her cervix was cleansed with Betadine solution and with the use of a ring forcep the cervical polyp was twisted office pedicle and submitted for histological evaluation. Also because of her irregular bleeding we proceeded with doing an endometrial biopsy. A sterile Pipelle was introduced into the uterine cavity uterus sounded to 10 cm and tissue was significant for histological evaluation. Patient tolerated procedure well. This was done because of the endometrial thickness that was reported on recent ultrasound and since patient is overweight and perimenopausal to rule out endometrial hyperplasia or endometrial malignancy. She will return back next week for a sonohysterogram to completely evaluate the intrauterine cavity to explain her bleeding. Her recent hemoglobin hematocrit were 16.4 and 47.6 respectively and a platelet count of 202,000.   Terrance Mass MD, 3:50 PM 04/04/2016

## 2016-04-05 LAB — HEPATITIS C ANTIBODY: HCV Ab: NEGATIVE

## 2016-04-05 LAB — GC/CHLAMYDIA PROBE AMP
CT Probe RNA: NOT DETECTED
GC Probe RNA: NOT DETECTED

## 2016-04-05 LAB — HEPATITIS B SURFACE ANTIGEN: HEP B S AG: NEGATIVE

## 2016-04-07 ENCOUNTER — Telehealth: Payer: Self-pay | Admitting: Gynecology

## 2016-04-07 ENCOUNTER — Other Ambulatory Visit: Payer: Self-pay | Admitting: Gynecology

## 2016-04-07 ENCOUNTER — Telehealth: Payer: Self-pay | Admitting: *Deleted

## 2016-04-07 DIAGNOSIS — N930 Postcoital and contact bleeding: Secondary | ICD-10-CM

## 2016-04-07 MED ORDER — MEGESTROL ACETATE 40 MG PO TABS: 40.0000 mg | ORAL_TABLET | Freq: Every day | ORAL | Status: DC

## 2016-04-07 NOTE — Telephone Encounter (Signed)
Pt was seen on 04/04/16 had endometrial bx had bleeding after office visit, bleeding stopped, then started again yesterday (sunday) not heavy, notices when wiping,small amount in toilet, also notes small tissue discharge as well. Slight cramping taking ibuprofen which helps. I explained to pt that no abnormal to have spotting after bx, but I would let you know as well. Please advise

## 2016-04-07 NOTE — Telephone Encounter (Signed)
04/07/16-Pt was advised today that her [facility] will cover the sonohysterogram with her $5.00 PCP copay. Per Anna@BC -ZTA#[id number]

## 2016-04-07 NOTE — Telephone Encounter (Signed)
Call in Megace 40 mg BID for 10 days

## 2016-04-07 NOTE — Telephone Encounter (Signed)
Pt aware, Rx sent. 

## 2016-04-08 ENCOUNTER — Ambulatory Visit: Payer: BLUE CROSS/BLUE SHIELD | Admitting: Family Medicine

## 2016-04-09 LAB — PAP, TP IMAGING W/ HPV RNA, RFLX HPV TYPE 16,18/45: HPV mRNA, High Risk: NOT DETECTED

## 2016-04-14 ENCOUNTER — Encounter: Payer: Self-pay | Admitting: Gynecology

## 2016-04-14 ENCOUNTER — Ambulatory Visit (INDEPENDENT_AMBULATORY_CARE_PROVIDER_SITE_OTHER): Payer: BLUE CROSS/BLUE SHIELD

## 2016-04-14 ENCOUNTER — Other Ambulatory Visit: Payer: Self-pay | Admitting: Gynecology

## 2016-04-14 ENCOUNTER — Ambulatory Visit (INDEPENDENT_AMBULATORY_CARE_PROVIDER_SITE_OTHER): Payer: BLUE CROSS/BLUE SHIELD | Admitting: Gynecology

## 2016-04-14 VITALS — BP 140/86

## 2016-04-14 DIAGNOSIS — R938 Abnormal findings on diagnostic imaging of other specified body structures: Secondary | ICD-10-CM | POA: Diagnosis not present

## 2016-04-14 DIAGNOSIS — N7011 Chronic salpingitis: Secondary | ICD-10-CM

## 2016-04-14 DIAGNOSIS — N9489 Other specified conditions associated with female genital organs and menstrual cycle: Secondary | ICD-10-CM

## 2016-04-14 DIAGNOSIS — D251 Intramural leiomyoma of uterus: Secondary | ICD-10-CM

## 2016-04-14 DIAGNOSIS — N930 Postcoital and contact bleeding: Secondary | ICD-10-CM

## 2016-04-14 DIAGNOSIS — R9389 Abnormal findings on diagnostic imaging of other specified body structures: Secondary | ICD-10-CM

## 2016-04-14 DIAGNOSIS — A599 Trichomoniasis, unspecified: Secondary | ICD-10-CM

## 2016-04-14 DIAGNOSIS — N926 Irregular menstruation, unspecified: Secondary | ICD-10-CM

## 2016-04-14 DIAGNOSIS — N840 Polyp of corpus uteri: Secondary | ICD-10-CM

## 2016-04-14 HISTORY — PX: OTHER SURGICAL HISTORY: SHX169

## 2016-04-14 LAB — WET PREP FOR TRICH, YEAST, CLUE
Clue Cells Wet Prep HPF POC: NONE SEEN
Trich, Wet Prep: NONE SEEN
Yeast Wet Prep HPF POC: NONE SEEN

## 2016-04-14 NOTE — Progress Notes (Signed)
Hailey Barnes is an 55 y.o. female who presented to the office today for sonohysterogram as part of her workup for dysfunction uterine bleeding and for preoperative consultation. Her history is as follows:  Patient was seen as a new patient in June 16 for the first time as a new patient. She had not seen a gynecologist in over 10 years although she had been followed at the [facility] family practice clinic may have been following her for her hypertension and type 2 diabetes. Patient also many years ago was a crack cocaine addict and she's been free from drugs for several years now. She was seen at the family practice clinic approximate 2 weeks ago and was treated for trichomoniasis. Review of her labs indicated that she had a negative RPR and HIV and GC and Chlamydia culture back pain. She had been complaining of postcoital bleeding when she was sexually active. She does report many years ago she's had abdominal myomectomy and C-sections as well. She has never been on a hormonal replacement therapy she stated her last menstrual period was about 6 months ago. When she was sexually active she use condoms on and off. She has not had a mammogram since age 62. She reports some form of abnormal Pap smear in the past but does not recall any specific treatment we have no records of that.. She has not had a colonoscopy.  Patient's PCP had ordered an ultrasound due to patient's vaginal bleeding and the following was reported by the radiologist: IMPRESSION: 1. The endometrial stripe measures 14.7 cm. This is prominent but within normal limits for a patient not yet in menopause. This would be abnormal after menopause however. Recommend follow-up as clinically warranted. 2. There is a simple cystic follicle in the left ovary requiring no additional follow-up. 3. The tubular fluid-filled structure the right adnexum could be a Hydrosalpinx.  Wet prep today before the sonohysterogram few bacteria few white blood  cells but no clue cells no Trichomonas. Patient had an endometrial biopsy last visit and the following was noted: Diagnosis Endometrium, biopsy - BENIGN ENDOMETRIAL TYPE POLYP.  Complete STD consisting of GC and chlamydia culture HIV, RPR, hepatitis B and C done at last office visit was negative.  Ultrasound today/sono hysterogram: Uterus measured 8.2 x 5.5 x 5.0 cm endometrial stripe 11.4 mm. Intramural fibroid 15 x 13 mm, 13 x 11 mm. Right ovary with a serpentine tubular cystic structure negative color flow measured 5.0 x 2.4 x 1.9 cm. Left ovarian follicle 24 x 18 mm. A tubular serpentine structure surrounding the left ovary measuring 3.6 x 1.4 x 2.8 cm was noted negative fluid in the cul-de-sac.  Pertinent Gynecological History: Menses: Intermenstrual bleeding Bleeding: intermenstrual bleeding Contraception: none DES exposure: unknown Blood transfusions: none Sexually transmitted diseases: Gonorrhea, chlamydia, Trichomonas Previous GYN Procedures: D&C 2 also myomectomy and cesarean section  Last mammogram: normal Date: 2012 Last pap: normal Date: 2015 OB History: G 1, P 1   Menstrual History: Menarche age: 54 Patient's last menstrual period was 08/31/2015.    Past Medical History  Diagnosis Date  . 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   Marland Kitchen Hypertension   . COPD (chronic obstructive pulmonary disease) ([place])   . Tingling in extremities 12/12/2010    Uric acid and electrolytes (02/23) normal but WBC elevated.   D/Dx: carpal tunnel, ulnar neuropathy Less likely: cervical radiculopathy, vasculitis   . CHF (congestive heart failure) ([place])   .  Chronic bronchitis ([place])     "get it q yr" (02/21/2014)  . Arthritis     "knees" (02/21/2014)  . Complication of anesthesia     "I don't come out well; I chew on my tongue"  . Heart murmur   . GERD (gastroesophageal reflux disease)   . Migraine 1982-2009  . Stroke [facility]) noted on CAT 02/2014    "light", LLE  weakness remains (03/30/2014)  . Chronic lower back pain   . Shortness of breath   . Fibroid     Past Surgical History  Procedure Laterality Date  . Cesarean section  1982  . Foot surgery Bilateral     "took bones out; put pins in"  . Left and right heart catheterization with coronary angiogram N/A 03/31/2014    Procedure: LEFT AND RIGHT HEART CATHETERIZATION WITH CORONARY ANGIOGRAM;  Surgeon: Birdie Riddle, MD;  Location: [place] CATH LAB;  Service: Cardiovascular;  Laterality: N/A;    Family History  Problem Relation Age of Onset  . Hypertension Mother   . Hypertension Father   . Cancer Sister     UTERINE???    Social History:  reports that she has been smoking Cigarettes.  She has a 9.25 pack-year smoking history. She has never used smokeless tobacco. She reports that she uses illicit drugs ("Crack" cocaine). She reports that she does not drink alcohol.  Allergies: No Known Allergies   (Not in a hospital admission)  REVIEW OF SYSTEMS: A ROS was performed and pertinent positives and negatives are included in the history.  GENERAL: No fevers or chills. HEENT: No change in vision, no earache, sore throat or sinus congestion. NECK: No pain or stiffness. CARDIOVASCULAR: No chest pain or pressure. No palpitations. PULMONARY: No shortness of breath, cough or wheeze. GASTROINTESTINAL: No abdominal pain, nausea, vomiting or diarrhea, melena or bright red blood per rectum. GENITOURINARY: No urinary frequency, urgency, hesitancy or dysuria. MUSCULOSKELETAL: No joint or muscle pain, no back pain, no recent trauma. DERMATOLOGIC: No rash, no itching, no lesions. ENDOCRINE: No polyuria, polydipsia, no heat or cold intolerance. No recent change in weight. HEMATOLOGICAL: No anemia or easy bruising or bleeding. NEUROLOGIC: No headache, seizures, numbness, tingling or weakness. PSYCHIATRIC: No depression, no loss of interest in normal activity or change in sleep pattern.     Blood pressure 140/86, last  menstrual period 08/31/2015.  Physical Exam:  HEENT:unremarkable Neck:Supple, midline, no thyroid megaly, no carotid bruits Lungs:  Clear to auscultation no rhonchi's or wheezes Heart:Regular rate and rhythm, no murmurs or gallops Breast Exam: Symmetrical in appearance no palpable mass or tenderness no supraclavicular axillary lymphadenopathy Abdomen: Soft nontender no rebound or guarding Pelvic:BUS within normal limits Vagina: No lesions or discharge Cervix: No lesions or discharge Uterus: Difficult to assess due to patient's abdominal girth and pendulous abdomen Adnexa: Same as above Extremities: No cords, no edema Rectal: Not done   Assessment/Plan: Patient with dysfunction uterine bleeding attributed to endometrial polyps. 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 Patient with one small intramural fibroid. Patient with multiple comorbidities an outpatient simple surgical procedure consisting a resectoscopic polypectomy to help with her dysfunctional bleeding will be scheduled. The following risk benefits and pros and cons were discussed:                        Patient was counseled as to the risk of surgery to include the following:  1. Infection (prohylactic  antibiotics will be administered)  2. DVT/Pulmonary Embolism (prophylactic pneumo compression stockings will be used)  3.Trauma to internal organs requiring additional surgical procedure to repair any injury to     Internal organs requiring perhaps additional hospitalization days.  4.Hemmorhage requiring transfusion and blood products which carry risks such as             anaphylactic reaction, hepatitis and AIDS  Patient had received literature information on the procedure scheduled and all her questions were answered and fully accepts all risk.  A CA 125 will be ordered today.   [facility] HMD12:18 PMTD     Dayquan Buys  H 04/14/2016, 12:10 PM

## 2016-04-15 LAB — CA 125: CA 125: 17 U/mL (ref <35)

## 2016-04-17 ENCOUNTER — Ambulatory Visit: Payer: BLUE CROSS/BLUE SHIELD | Admitting: Family Medicine

## 2016-04-17 ENCOUNTER — Encounter: Payer: Self-pay | Admitting: Family Medicine

## 2016-04-17 ENCOUNTER — Ambulatory Visit (INDEPENDENT_AMBULATORY_CARE_PROVIDER_SITE_OTHER): Payer: BLUE CROSS/BLUE SHIELD | Admitting: Family Medicine

## 2016-04-17 ENCOUNTER — Ambulatory Visit (HOSPITAL_COMMUNITY)
Admission: RE | Admit: 2016-04-17 | Discharge: 2016-04-17 | Disposition: A | Discharge status: Home / Self Care | Payer: BLUE CROSS/BLUE SHIELD | Source: Ambulatory Visit | Attending: Family Medicine | Admitting: Family Medicine

## 2016-04-17 VITALS — BP 164/95 | HR 73 | Temp 97.9°F | Wt 292.2 lb

## 2016-04-17 DIAGNOSIS — I10 Essential (primary) hypertension: Secondary | ICD-10-CM | POA: Diagnosis not present

## 2016-04-17 DIAGNOSIS — R079 Chest pain, unspecified: Secondary | ICD-10-CM | POA: Diagnosis not present

## 2016-04-17 DIAGNOSIS — E119 Type 2 diabetes mellitus without complications: Secondary | ICD-10-CM

## 2016-04-17 DIAGNOSIS — E785 Hyperlipidemia, unspecified: Secondary | ICD-10-CM | POA: Diagnosis not present

## 2016-04-17 DIAGNOSIS — R0602 Shortness of breath: Secondary | ICD-10-CM

## 2016-04-17 LAB — COMPLETE METABOLIC PANEL WITH GFR
ALBUMIN: 3.8 g/dL (ref 3.6–5.1)
ALT: 24 U/L (ref 6–29)
AST: 16 U/L (ref 10–35)
Alkaline Phosphatase: 79 U/L (ref 33–130)
BUN: 16 mg/dL (ref 7–25)
CALCIUM: 9.5 mg/dL (ref 8.6–10.4)
CHLORIDE: 102 mmol/L (ref 98–110)
CO2: 29 mmol/L (ref 20–31)
Creat: 1.05 mg/dL (ref 0.50–1.05)
GFR, EST AFRICAN AMERICAN: 70 mL/min (ref >=60)
GFR, Est Non African American: 61 mL/min (ref >=60)
Glucose, Bld: 80 mg/dL (ref 65–99)
POTASSIUM: 3.8 mmol/L (ref 3.5–5.3)
SODIUM: 138 mmol/L (ref 135–146)
Total Bilirubin: 0.5 mg/dL (ref 0.2–1.2)
Total Protein: 6.5 g/dL (ref 6.1–8.1)

## 2016-04-17 LAB — CBC
HCT: 45.5 % — ABNORMAL HIGH (ref 35.0–45.0)
HEMOGLOBIN: 15.4 g/dL (ref 11.7–15.5)
MCH: 27.9 pg (ref 27.0–33.0)
MCHC: 33.8 g/dL (ref 32.0–36.0)
MCV: 82.4 fL (ref 80.0–100.0)
MPV: 11 fL (ref 7.5–12.5)
Platelets: 199 10*3/uL (ref 140–400)
RBC: 5.52 MIL/uL — AB (ref 3.80–5.10)
RDW: 15.3 % — ABNORMAL HIGH (ref 11.0–15.0)
WBC: 10.8 10*3/uL (ref 3.8–10.8)

## 2016-04-17 LAB — LIPID PANEL
CHOL/HDL RATIO: 3.7 ratio (ref <=5.0)
CHOLESTEROL: 136 mg/dL (ref 125–200)
HDL: 37 mg/dL — ABNORMAL LOW (ref >=46)
LDL Cholesterol: 69 mg/dL (ref <130)
Triglycerides: 150 mg/dL — ABNORMAL HIGH (ref <150)
VLDL: 30 mg/dL (ref <30)

## 2016-04-17 LAB — POCT GLYCOSYLATED HEMOGLOBIN (HGB A1C): Hemoglobin A1C: 6.7

## 2016-04-17 MED ORDER — ALBUTEROL SULFATE (2.5 MG/3ML) 0.083% IN NEBU: 2.5000 mg | INHALATION_SOLUTION | Freq: Once | RESPIRATORY_TRACT | Status: DC

## 2016-04-17 MED ORDER — IPRATROPIUM BROMIDE 0.02 % IN SOLN: 0.5000 mg | Freq: Once | RESPIRATORY_TRACT | Status: DC

## 2016-04-17 NOTE — Patient Instructions (Addendum)
We will check blood work today.  I want you to talk to your heart doctor about if you need any further testing for before your procedure.   We will not make any medication changes today.   I think you may be having a mild COPD flare. Please use your albuterol every 4-6 hours over the next couple days. If your symptoms are not improving, please let us know.   Your A1c was 6.7 today.   Please come back to see me in 3 months, or sooner if you need anything else.   Take care,  Dr [name]

## 2016-04-17 NOTE — Assessment & Plan Note (Signed)
Elevated today. Previously well controlled. Continue current medications. If elevated at next visit, consider increased pharmacotherapy.

## 2016-04-17 NOTE — Assessment & Plan Note (Signed)
Tolerating atorvastatin without side effects. Will continue. Check CMP and lipid panel today.

## 2016-04-17 NOTE — Assessment & Plan Note (Signed)
A1c improved to 6.7. Continue metformin. Follow up in 3 months.

## 2016-04-17 NOTE — Progress Notes (Signed)
   Subjective:  Hailey Barnes is a 55 y.o. female who presents to the [facility] today with a chief complaint of surgical clearance.   HPI:  Surgical Clearance  Patient saw her gynecologist 3 days ago for dysfunctional uterine bleeding. Is currently schedule to have outpatient D&C/hysteroscopy with polypectomy. Current having occasional chest pain and mild shortness of breath (see below problems. No LE edema.  Chest Pain / Shortness of Breath Patient has noticed sharp, substernal chest pain for the past 1.5 weeks. No obvious precipitating factors. Pain occurs at random and is not associated with exertion. No nausea or vomiting. No diaphoresis. Pain can occur at rest. Has also noticed increased shortness of breath for the past week with some wheezing and increased cough. No significant sputum production. Is compliant with COPD inhalers, though has not tried albuterol.   Hypertension BP Readings from Last 3 Encounters:  04/17/16 164/95  04/14/16 140/86  04/04/16 142/90   Home BP monitoring-Yes Compliant with medications-yes, without side effects ROS-Denies any  HA, blurry vision, LE edema, transient weakness, orthopnea, PND.   T2DM Doing well with metformin. No side effects. No polyuria or polydipsia.   HLD Started on atorvastatin 3 months ago. Toleratin well without side effects.   ROS: Per HPI  PMH: Smoking history reviewed.    Objective:  Physical Exam: BP 164/95 mmHg  Pulse 73  Temp(Src) 97.9 F (36.6 C) (Oral)  Wt 292 lb 3.2 oz (132.541 kg)  SpO2 96%  LMP 08/31/2015  Gen: NAD, resting comfortably. Speaking in full sentences.  CV: RRR with no murmurs appreciated Pulm: NWOB, Faint bibasilar crackles. No wheezes. CTAB with no crackles, wheezes, or rhonchi GI: Normal bowel sounds present. Soft, Nontender, Nondistended. MSK: Trace LE edema bilaterally. No cyanosis.  Skin: warm, dry Neuro: grossly normal, moves all extremities Psych: Normal affect and thought  content  LABS/Tests: EKG: NSR, no ischemic changes A1c: 6.7  Assessment/Plan:  Chest Pain / Shortness of Breath Likely secondary to mild COPD flare. Symptoms resolved in clinic with albuterol/ipratropium treatment. O2 sat 96%. EKG without ischemic changes. Doubt cardiac etiology given prolonged nature and improvement with breathing treatments. Will continue current COPD medications. Instructed patient to use albuterol inhaler every 4-6 hours as needed. Return precautions reviewed.   Surgical Clearance Patient has a follow up appointment planned with cardiology tomorrow. Patient with adequate functional status and is undergoing a low risk procedure (hysteroscopy), but given her history of CHF and current chest pain/shortness of breath, will defer clearance until has cardiologist evaluation.   Hypertension Elevated today. Previously well controlled. Continue current medications. If elevated at next visit, consider increased pharmacotherapy.   T2DM (type 2 diabetes mellitus) (HCC) A1c improved to 6.7. Continue metformin. Follow up in 3 months.   Hyperlipidemia Tolerating atorvastatin without side effects. Will continue. Check CMP and lipid panel today.    [name]. [name], [place] Medicine Resident PGY-2 04/17/2016 2:47 PM

## 2016-04-18 ENCOUNTER — Encounter: Payer: Self-pay | Admitting: Family Medicine

## 2016-04-18 ENCOUNTER — Ambulatory Visit (INDEPENDENT_AMBULATORY_CARE_PROVIDER_SITE_OTHER): Payer: BLUE CROSS/BLUE SHIELD | Admitting: Cardiology

## 2016-04-18 ENCOUNTER — Encounter: Payer: Self-pay | Admitting: Cardiology

## 2016-04-18 VITALS — BP 136/94 | HR 86 | Ht 64.0 in | Wt 293.4 lb

## 2016-04-18 DIAGNOSIS — I5032 Chronic diastolic (congestive) heart failure: Secondary | ICD-10-CM

## 2016-04-18 DIAGNOSIS — E785 Hyperlipidemia, unspecified: Secondary | ICD-10-CM | POA: Diagnosis not present

## 2016-04-18 DIAGNOSIS — Z01810 Encounter for preprocedural cardiovascular examination: Secondary | ICD-10-CM

## 2016-04-18 DIAGNOSIS — J438 Other emphysema: Secondary | ICD-10-CM

## 2016-04-18 NOTE — Patient Instructions (Signed)
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
## 2016-04-25 ENCOUNTER — Other Ambulatory Visit (HOSPITAL_COMMUNITY): Payer: BLUE CROSS/BLUE SHIELD

## 2016-04-29 NOTE — Patient Instructions (Addendum)
Your procedure is scheduled on:  05/06/16  Enter through the Main Entrance at :  0730 am  Pick up desk phone and dial [phone] and inform us of your arrival.  Please call [phone] if you have any problems the morning of surgery.  Remember: Do not eat food or drink liquids, including water, after midnight:Monday   You may brush your teeth the morning of surgery.  Take these meds the morning of surgery with a sip of water: Atorvastatin,Carvedilol, Lisinopril, Spironolactone  Use inhalers as needed or as usual. Bring Albuterol to hospital day of surgery.  Hold Metformin dose the night before surgery, and do not take the morning of surgery.  Do NOT smoke the day of surgery.  DO NOT wear jewelry, eye make-up, lipstick,body lotion, or dark fingernail polish.  (Polished toes are ok) You may wear deodorant.  Patients discharged on the day of surgery will not be allowed to drive home. Wear loose fitting, comfortable clothes for your ride home.

## 2016-04-30 ENCOUNTER — Encounter (HOSPITAL_COMMUNITY): Payer: Self-pay

## 2016-04-30 ENCOUNTER — Encounter (HOSPITAL_COMMUNITY)
Admission: RE | Admit: 2016-04-30 | Discharge: 2016-04-30 | Disposition: A | Discharge status: Home / Self Care | Payer: BLUE CROSS/BLUE SHIELD | Source: Ambulatory Visit | Attending: Gynecology | Admitting: Gynecology

## 2016-04-30 DIAGNOSIS — I110 Hypertensive heart disease with heart failure: Secondary | ICD-10-CM | POA: Diagnosis not present

## 2016-04-30 DIAGNOSIS — Z7984 Long term (current) use of oral hypoglycemic drugs: Secondary | ICD-10-CM | POA: Diagnosis not present

## 2016-04-30 DIAGNOSIS — H109 Unspecified conjunctivitis: Secondary | ICD-10-CM | POA: Diagnosis not present

## 2016-04-30 DIAGNOSIS — G4730 Sleep apnea, unspecified: Secondary | ICD-10-CM | POA: Diagnosis not present

## 2016-04-30 DIAGNOSIS — J449 Chronic obstructive pulmonary disease, unspecified: Secondary | ICD-10-CM | POA: Diagnosis not present

## 2016-04-30 DIAGNOSIS — Z79899 Other long term (current) drug therapy: Secondary | ICD-10-CM | POA: Diagnosis not present

## 2016-04-30 DIAGNOSIS — K219 Gastro-esophageal reflux disease without esophagitis: Secondary | ICD-10-CM | POA: Diagnosis not present

## 2016-04-30 DIAGNOSIS — E119 Type 2 diabetes mellitus without complications: Secondary | ICD-10-CM | POA: Diagnosis not present

## 2016-04-30 DIAGNOSIS — I509 Heart failure, unspecified: Secondary | ICD-10-CM | POA: Diagnosis not present

## 2016-04-30 DIAGNOSIS — H578 Other specified disorders of eye and adnexa: Secondary | ICD-10-CM | POA: Diagnosis present

## 2016-04-30 DIAGNOSIS — F17210 Nicotine dependence, cigarettes, uncomplicated: Secondary | ICD-10-CM | POA: Diagnosis not present

## 2016-04-30 DIAGNOSIS — Z7982 Long term (current) use of aspirin: Secondary | ICD-10-CM | POA: Diagnosis not present

## 2016-04-30 DIAGNOSIS — N898 Other specified noninflammatory disorders of vagina: Secondary | ICD-10-CM | POA: Diagnosis not present

## 2016-04-30 HISTORY — DX: Sleep apnea, unspecified: G47.30

## 2016-04-30 HISTORY — DX: Type 2 diabetes mellitus without complications: E11.9

## 2016-04-30 HISTORY — DX: Myoneural disorder, unspecified: G70.9

## 2016-04-30 HISTORY — DX: Nonrheumatic mitral (valve) prolapse: I34.1

## 2016-04-30 NOTE — Pre-Procedure Instructions (Addendum)
Dr. Hoy Morn came to speak with Hailey Barnes during her pre operative appointment.  He discussed her elevated blood pressure with her and possible surgery cancellation day of surgery if diastolic blood pressure 161 or greater, Hailey Barnes verbalized understanding.  She has missed 2 days of taking blood pressure medications this week.  Dr. Candee Furbish and Dr. Hetty Blend Parker's medical and cardiac clearance notes are located in [facility] notes.  Dr. Gifford Shave was fine with the results from lab work on 04/18/16 so no labs were drawn today.

## 2016-05-03 ENCOUNTER — Ambulatory Visit (HOSPITAL_COMMUNITY)
Admission: EM | Admit: 2016-05-03 | Discharge: 2016-05-03 | Disposition: A | Discharge status: Home / Self Care | Payer: BLUE CROSS/BLUE SHIELD | Attending: Emergency Medicine | Admitting: Emergency Medicine

## 2016-05-03 ENCOUNTER — Encounter (HOSPITAL_COMMUNITY): Payer: Self-pay | Admitting: *Deleted

## 2016-05-03 DIAGNOSIS — I110 Hypertensive heart disease with heart failure: Secondary | ICD-10-CM | POA: Insufficient documentation

## 2016-05-03 DIAGNOSIS — H109 Unspecified conjunctivitis: Secondary | ICD-10-CM | POA: Insufficient documentation

## 2016-05-03 DIAGNOSIS — J449 Chronic obstructive pulmonary disease, unspecified: Secondary | ICD-10-CM | POA: Insufficient documentation

## 2016-05-03 DIAGNOSIS — G4730 Sleep apnea, unspecified: Secondary | ICD-10-CM | POA: Insufficient documentation

## 2016-05-03 DIAGNOSIS — K219 Gastro-esophageal reflux disease without esophagitis: Secondary | ICD-10-CM | POA: Insufficient documentation

## 2016-05-03 DIAGNOSIS — F17210 Nicotine dependence, cigarettes, uncomplicated: Secondary | ICD-10-CM | POA: Insufficient documentation

## 2016-05-03 DIAGNOSIS — Z7984 Long term (current) use of oral hypoglycemic drugs: Secondary | ICD-10-CM | POA: Insufficient documentation

## 2016-05-03 DIAGNOSIS — N898 Other specified noninflammatory disorders of vagina: Secondary | ICD-10-CM | POA: Insufficient documentation

## 2016-05-03 DIAGNOSIS — Z7982 Long term (current) use of aspirin: Secondary | ICD-10-CM | POA: Insufficient documentation

## 2016-05-03 DIAGNOSIS — I509 Heart failure, unspecified: Secondary | ICD-10-CM | POA: Insufficient documentation

## 2016-05-03 DIAGNOSIS — Z79899 Other long term (current) drug therapy: Secondary | ICD-10-CM | POA: Insufficient documentation

## 2016-05-03 DIAGNOSIS — E119 Type 2 diabetes mellitus without complications: Secondary | ICD-10-CM | POA: Insufficient documentation

## 2016-05-03 MED ORDER — FLUCONAZOLE 150 MG PO TABS: ORAL_TABLET | ORAL | Status: DC

## 2016-05-03 MED ORDER — POLYMYXIN B-TRIMETHOPRIM 10000-0.1 UNIT/ML-% OP SOLN: 1.0000 [drp] | OPHTHALMIC | Status: DC

## 2016-05-03 NOTE — ED Provider Notes (Signed)
CSN: [id number]     Arrival date & time 05/03/16  1306 History   First MD Initiated Contact with Patient 05/03/16 1436     Chief Complaint  Patient presents with  . Eye Problem   (Consider location/radiation/quality/duration/timing/severity/associated sxs/prior Treatment) HPI Comments: Morbidly obese 55 year old female presents to the urgent care with 2 complaints. First concern is that of eye discomfort. She states that 3 days ago her right became red with itching. Is seen started with the left eye. She has had some clear drainage from the right eye. Denies problems with vision. No pain.  Second complaint is that of vaginal itching. She states 3 weeks ago she had sexual intercourse and approximately one week ago she developed some itching. She is uncertain if she has a discharge or not. She states she is unable to visualize the area. Denies pelvic pain.   Past Medical History  Diagnosis Date  . 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 Hypertension   . COPD (chronic obstructive pulmonary disease) ([place])   . Tingling in extremities 12/12/2010    Uric acid and electrolytes (02/23) normal but WBC elevated.   D/Dx: carpal tunnel, ulnar neuropathy Less likely: cervical radiculopathy, vasculitis   . CHF (congestive heart failure) ([place])   . Chronic bronchitis ([place])     "get it q yr" (02/21/2014)  . Heart murmur   . GERD (gastroesophageal reflux disease)   . Migraine 1982-2009  . Stroke [facility]) noted on CAT 02/2014    "light", LLE weakness remains (03/30/2014)  . Chronic lower back pain   . Shortness of breath   . Fibroid   . MVP (mitral valve prolapse)   . Sleep apnea 02/2016    recent hospitalization noted apnea during stay no sleep studt performed since this episode  . Diabetes mellitus without complication ([place])   . Neuromuscular disorder ([place])     right leg nerve pain  . Arthritis     "knees" (02/21/2014), back  . Complication of  anesthesia     "I don't come out well; I chew on my tongue"   Past Surgical History  Procedure Laterality Date  . Cesarean section  1982  . Foot surgery Bilateral     "took bones out; put pins in"  . Left and right heart catheterization with coronary angiogram N/A 03/31/2014    Procedure: LEFT AND RIGHT HEART CATHETERIZATION WITH CORONARY ANGIOGRAM;  Surgeon: Birdie Riddle, MD;  Location: [facility];  Service: Cardiovascular;  Laterality: N/A;  . Myomectomy    . Dilation and curettage of uterus     Family History  Problem Relation Age of Onset  . Hypertension Mother   . Hypertension Father   . Cancer Sister     UTERINE???   Social History  Substance Use Topics  . Smoking status: Current Every Day Smoker -- 0.50 packs/day for 37 years    Types: Cigarettes  . Smokeless tobacco: Never Used     Comment: taking chantix  . Alcohol Use: No   OB History    Gravida Para Term Preterm AB TAB SAB Ectopic Multiple Living   1 1        1      Review of Systems  Constitutional: Negative.   HENT: Negative.   Eyes: Positive for discharge, redness and itching. Negative for photophobia and visual disturbance.  Respiratory: Negative.   Cardiovascular: Negative.   Genitourinary: Negative for dysuria,  urgency, frequency, flank pain, vaginal bleeding, genital sores, menstrual problem and pelvic pain.  Musculoskeletal: Negative.   Skin: Negative.   Neurological: Negative.   Psychiatric/Behavioral: Negative.   All other systems reviewed and are negative.   Allergies  Review of patient's allergies indicates no known allergies.  Home Medications   Prior to Admission medications   Medication Sig Start Date End Date Taking? Authorizing Provider  albuterol (PROVENTIL HFA;VENTOLIN HFA) 108 (90 Base) MCG/ACT inhaler Inhale 2 puffs into the lungs every 6 (six) hours as needed for wheezing or shortness of breath.     Historical Provider, MD  aspirin 81 MG tablet Take 81 mg by mouth daily.     Historical Provider, MD  atorvastatin (LIPITOR) 40 MG tablet Take 1 tablet (40 mg total) by mouth daily. 02/07/16   Zenia Resides, MD  carvedilol (COREG) 25 MG tablet Take 1 tablet (25 mg total) by mouth 2 (two) times daily with a meal. 03/18/16   Kinnie Feil, MD  fluconazole (DIFLUCAN) 150 MG tablet 1 tab po x 1. May repeat in 72 hours if no improvement 05/03/16   Janne Napoleon, NP  furosemide (LASIX) 40 MG tablet Take 3 tablets (120 mg total) by mouth 2 (two) times daily. Patient taking differently: Take 120 mg by mouth daily.  03/20/16   Vivi Barrack, MD  gabapentin (NEURONTIN) 300 MG capsule Take 300 mg by mouth at bedtime. 01/26/16   Historical Provider, MD  hydrocortisone 1 % ointment Apply 1 application topically 2 (two) times daily as needed (eczema on face).    Historical Provider, MD  lisinopril (PRINIVIL,ZESTRIL) 10 MG tablet Take 2 tablets (20 mg total) by mouth daily. 03/18/16   Kinnie Feil, MD  meloxicam (MOBIC) 15 MG tablet Take 15 mg by mouth daily as needed for pain.  01/25/16   Historical Provider, MD  metFORMIN (GLUCOPHAGE) 500 MG tablet Take 1 tablet (500 mg total) by mouth 2 (two) times daily with a meal. 12/07/15   Vivi Barrack, MD  mometasone-formoterol (DULERA) 200-5 MCG/ACT AERO Inhale 2 puffs into the lungs 2 (two) times daily.    Historical Provider, MD  Spacer/Aero-Holding Chambers (E-Z SPACER) inhaler Use as instructed 01/22/16   Olam Idler, MD  spironolactone (ALDACTONE) 25 MG tablet Take 1 tablet (25 mg total) by mouth daily. 03/18/16   Kinnie Feil, MD  tiotropium (SPIRIVA) 18 MCG inhalation capsule Place 1 capsule (18 mcg total) into inhaler and inhale daily. 03/18/16   Kinnie Feil, MD  tiZANidine (ZANAFLEX) 2 MG tablet Take 1 tablet by mouth at bedtime as needed for muscle spasms.  03/25/16   Historical Provider, MD  triamcinolone cream (KENALOG) 0.1 % Apply 1 application topically 3 (three) times daily as needed (eczema on arms).    Historical Provider, MD   trimethoprim-polymyxin b (POLYTRIM) ophthalmic solution Place 1 drop into both eyes every 4 (four) hours. 05/03/16   Janne Napoleon, NP   Meds Ordered and Administered this Visit  Medications - No data to display  BP 148/86 mmHg  Pulse 78  Temp(Src) 98.6 F (37 C) (Oral)  Resp 16  SpO2 100%  LMP 08/31/2015 No data found.   Physical Exam  Constitutional: She is oriented to person, place, and time. She appears well-developed and well-nourished. No distress.  Eyes: EOM are normal.  Bilateral upper and lower lid conjunctival erythema. Minor scleral injection. EOMI. No current drainage. Anterior chamber is clear. Pupils equal round reactive to light.  Neck:  Normal range of motion. Neck supple.  Cardiovascular: Normal rate.   Pulmonary/Chest: Effort normal. No respiratory distress.  Genitourinary:  Normal external female genitalia. No discoloration or lesions. Cervix is far right of the midline had to use large Q-tip to manipulate the cervix to point more to the center. The portion of the ectocervix seen has some inflammatory changes and scarring. No discharge from the os. There is a scant amount of a white thick discharge at the base of the cervix. No other vaginal discharge is seen. No cervical tenderness with manipulation of the cervix with Q-tip. Due to body habitus unable to palpate cervix.  Musculoskeletal: She exhibits no edema.  Neurological: She is alert and oriented to person, place, and time. She exhibits normal muscle tone.  Skin: Skin is warm and dry.  Psychiatric: She has a normal mood and affect.  Nursing note and vitals reviewed.   ED Course  Procedures (including critical care time)  Labs Review Labs Reviewed - No data to display  Imaging Review No results found.   Visual Acuity Review  Right Eye Distance:   Left Eye Distance:   Bilateral Distance:    Right Eye Near:   Left Eye Near:    Bilateral Near:         MDM   1. Bilateral conjunctivitis   2.  Vaginal irritation    Warm compresses to the eye. Eyedrops as directed. Take medications as directed for irritation of the vagina.    cervical cytology pending. Meds ordered this encounter  Medications  . trimethoprim-polymyxin b (POLYTRIM) ophthalmic solution    Sig: Place 1 drop into both eyes every 4 (four) hours.    Dispense:  10 mL    Refill:  0    Order Specific Question:  Supervising Provider    Answer:  Melony Overly [id number]  . fluconazole (DIFLUCAN) 150 MG tablet    Sig: 1 tab po x 1. May repeat in 72 hours if no improvement    Dispense:  2 tablet    Refill:  0    Order Specific Question:  Supervising Provider    Answer:  Melony Overly [[id number]]     Janne Napoleon, NP 05/03/16 1510

## 2016-05-03 NOTE — ED Notes (Signed)
Pt  Reports symptoms  Of  Eye irritation  As  Well  As  Vaginal  Irritation   And itching      Pt  Reports     The eyes  Have  Been red          X  2 days  And the irritation  X  1  Week      Pt  Appears  In no  Acute  Distress

## 2016-05-03 NOTE — Discharge Instructions (Signed)
Bacterial Conjunctivitis Bacterial conjunctivitis (commonly called pink eye) is redness, soreness, or puffiness (inflammation) of the white part of your eye. It is caused by a germ called bacteria. These germs can easily spread from person to person (contagious). Your eye often will become red or pink. Your eye may also become irritated, watery, or have a thick discharge.  HOME CARE  1. Apply a cool, clean washcloth over closed eyelids. Do this for 10-20 minutes, 3-4 times a day while you have pain. 2. Gently wipe away any fluid coming from the eye with a warm, wet washcloth or cotton ball. 3. Wash your hands often with soap and water. Use paper towels to dry your hands. 4. Do not share towels or washcloths. 5. Change or wash your pillowcase every day. 6. Do not use eye makeup until the infection is gone. 7. Do not use machines or drive if your vision is blurry. 8. Stop using contact lenses. Do not use them again until your doctor says it is okay. 9. Do not touch the tip of the eye drop bottle or medicine tube with your fingers when you put medicine on the eye. GET HELP RIGHT AWAY IF:  1. Your eye is not better after 3 days of starting your medicine. 2. You have a yellowish fluid coming out of the eye. 3. You have more pain in the eye. 4. Your eye redness is spreading. 5. Your vision becomes blurry. 6. You have a fever or lasting symptoms for more than 2-3 days. 7. You have a fever and your symptoms suddenly get worse. 8. You have pain in the face. 9. Your face gets red or puffy (swollen). MAKE SURE YOU:   Understand these instructions.  Will watch this condition.  Will get help right away if you are not doing well or get worse.   This information is not intended to replace advice given to you by your health care provider. Make sure you discuss any questions you have with your health care provider.   Document Released: 07/15/2008 Document Revised: 09/22/2012 Document Reviewed:  06/11/2012 Elsevier Interactive Patient Education 2016 [employer].  How to Use Eye Drops and Eye Ointments HOW TO APPLY EYE DROPS Follow these steps when applying eye drops: 10. Wash your hands. 11. Tilt your head back. 12. Put a finger under your eye and use it to gently pull your lower lid downward. Keep that finger in place. 13. Using your other hand, hold the dropper between your thumb and index finger. 14. Position the dropper just over the edge of the lower lid. Hold it as close to your eye as you can without touching the dropper to your eye. 15. Steady your hand. One way to do this is to lean your index finger against your brow. 16. Look up. 17. Slowly and gently squeeze one drop of medicine into your eye. 18. Close your eye. 19. Place a finger between your lower eyelid and your nose. Press gently for 2 minutes. This increases the amount of time that the medicine is exposed to the eye. It also reduces side effects that can develop if the drop gets into the bloodstream through the nose. HOW TO APPLY EYE OINTMENTS Follow these steps when applying eye ointments: 10. Wash your hands. 11. Put a finger under your eye and use it to gently pull your lower lid downward. Keep that finger in place. 12. Using your other hand, place the tip of the tube between your thumb and index finger with  the remaining fingers braced against your cheek or nose. 13. Hold the tube just over the edge of your lower lid without touching the tube to your lid or eyeball. 14. Look up. 15. Line the inner part of your lower lid with ointment. 16. Gently pull up on your upper lid and look down. This will force the ointment to spread over the surface of the eye. 17. Release the upper lid. 18. If you can, close your eyes for 1-2 minutes. Do not rub your eyes. If you applied the ointment correctly, your vision will be blurry for a few minutes. This is normal. ADDITIONAL INFORMATION  Make sure to use the eye drops or  ointment as told by your health care provider.  If you have been told to use both eye drops and an eye ointment, apply the eye drops first, then wait 3-4 minutes before you apply the ointment.  Try not to touch the tip of the dropper or tube to your eye. A dropper or tube that has touched the eye can become contaminated.   This information is not intended to replace advice given to you by your health care provider. Make sure you discuss any questions you have with your health care provider.   Document Released: 01/12/2001 Document Revised: 02/20/2015 Document Reviewed: 10/02/2014 Elsevier Interactive Patient Education [employer].

## 2016-05-05 LAB — CERVICOVAGINAL ANCILLARY ONLY
CHLAMYDIA, DNA PROBE: NEGATIVE
Neisseria Gonorrhea: NEGATIVE

## 2016-05-05 MED ORDER — DEXTROSE 5 % IV SOLN
2.0000 g | INTRAVENOUS | Status: AC
  Administered 2016-05-06: 2 g via INTRAVENOUS
  Filled 2016-05-05: qty 2

## 2016-05-05 NOTE — Anesthesia Preprocedure Evaluation (Addendum)
Anesthesia Evaluation  Patient identified by MRN, date of birth, ID band Patient awake    Reviewed: Allergy & Precautions, H&P , NPO status , Patient's Chart, lab work & pertinent test results  History of Anesthesia Complications (+) history of anesthetic complications  Airway Mallampati: II  TM Distance: >3 FB Neck ROM: full    Dental no notable dental hx.    Pulmonary sleep apnea , COPD, Current Smoker,    Pulmonary exam normal breath sounds clear to auscultation       Cardiovascular hypertension, Normal cardiovascular exam Rhythm:regular Rate:Normal     Neuro/Psych  Headaches, PSYCHIATRIC DISORDERS Depression CVA, Residual Symptoms    GI/Hepatic GERD  ,(+) Hepatitis -  Endo/Other  diabetesMorbid obesity  Renal/GU negative Renal ROS     Musculoskeletal   Abdominal   Peds  Hematology negative hematology ROS (+)   Anesthesia Other Findings LLE weakness from stroke, super morbidly obese  Reproductive/Obstetrics negative OB ROS                          Anesthesia Physical Anesthesia Plan  ASA: III  Anesthesia Plan: General   Post-op Pain Management:    Induction: Intravenous  Airway Management Planned: Oral ETT  Additional Equipment:   Intra-op Plan:   Post-operative Plan: Extubation in OR  Informed Consent: I have reviewed the patients History and Physical, chart, labs and discussed the procedure including the risks, benefits and alternatives for the proposed anesthesia with the patient or authorized representative who has indicated his/her understanding and acceptance.   Dental Advisory Given  Plan Discussed with: Anesthesiologist, CRNA and Surgeon  Anesthesia Plan Comments:        Anesthesia Quick Evaluation

## 2016-05-06 ENCOUNTER — Encounter (HOSPITAL_COMMUNITY)
Admission: RE | Disposition: A | Discharge status: Home / Self Care | Payer: Self-pay | Source: Ambulatory Visit | Attending: Gynecology

## 2016-05-06 ENCOUNTER — Ambulatory Visit (HOSPITAL_COMMUNITY): Payer: BLUE CROSS/BLUE SHIELD | Admitting: Anesthesiology

## 2016-05-06 ENCOUNTER — Ambulatory Visit (HOSPITAL_COMMUNITY)
Admission: RE | Admit: 2016-05-06 | Discharge: 2016-05-06 | Disposition: A | Discharge status: Home / Self Care | Payer: BLUE CROSS/BLUE SHIELD | Source: Ambulatory Visit | Attending: Gynecology | Admitting: Gynecology

## 2016-05-06 ENCOUNTER — Encounter (HOSPITAL_COMMUNITY): Payer: Self-pay | Admitting: *Deleted

## 2016-05-06 DIAGNOSIS — F17210 Nicotine dependence, cigarettes, uncomplicated: Secondary | ICD-10-CM | POA: Insufficient documentation

## 2016-05-06 DIAGNOSIS — E119 Type 2 diabetes mellitus without complications: Secondary | ICD-10-CM | POA: Insufficient documentation

## 2016-05-06 DIAGNOSIS — I10 Essential (primary) hypertension: Secondary | ICD-10-CM | POA: Insufficient documentation

## 2016-05-06 DIAGNOSIS — J449 Chronic obstructive pulmonary disease, unspecified: Secondary | ICD-10-CM | POA: Insufficient documentation

## 2016-05-06 DIAGNOSIS — K219 Gastro-esophageal reflux disease without esophagitis: Secondary | ICD-10-CM | POA: Insufficient documentation

## 2016-05-06 DIAGNOSIS — G4730 Sleep apnea, unspecified: Secondary | ICD-10-CM | POA: Diagnosis not present

## 2016-05-06 DIAGNOSIS — I509 Heart failure, unspecified: Secondary | ICD-10-CM | POA: Diagnosis not present

## 2016-05-06 DIAGNOSIS — N840 Polyp of corpus uteri: Secondary | ICD-10-CM

## 2016-05-06 DIAGNOSIS — Z6841 Body Mass Index (BMI) 40.0 and over, adult: Secondary | ICD-10-CM | POA: Diagnosis not present

## 2016-05-06 DIAGNOSIS — N939 Abnormal uterine and vaginal bleeding, unspecified: Secondary | ICD-10-CM | POA: Diagnosis present

## 2016-05-06 DIAGNOSIS — Z8673 Personal history of transient ischemic attack (TIA), and cerebral infarction without residual deficits: Secondary | ICD-10-CM | POA: Diagnosis not present

## 2016-05-06 DIAGNOSIS — N938 Other specified abnormal uterine and vaginal bleeding: Secondary | ICD-10-CM | POA: Diagnosis not present

## 2016-05-06 HISTORY — DX: Unspecified purulent endophthalmitis, right eye: H44.001

## 2016-05-06 HISTORY — PX: DILATATION & CURETTAGE/HYSTEROSCOPY WITH MYOSURE: SHX6511

## 2016-05-06 LAB — GLUCOSE, CAPILLARY
GLUCOSE-CAPILLARY: 132 mg/dL — AB (ref 65–99)
Glucose-Capillary: 151 mg/dL — ABNORMAL HIGH (ref 65–99)

## 2016-05-06 LAB — CERVICOVAGINAL ANCILLARY ONLY: Wet Prep (BD Affirm): NEGATIVE

## 2016-05-06 SURGERY — DILATATION & CURETTAGE/HYSTEROSCOPY WITH MYOSURE
Anesthesia: General

## 2016-05-06 MED ORDER — VASOPRESSIN 20 UNIT/ML IV SOLN
INTRAVENOUS | Status: DC | PRN
  Administered 2016-05-06: 5 [IU]

## 2016-05-06 MED ORDER — SCOPOLAMINE 1 MG/3DAYS TD PT72
1.0000 | MEDICATED_PATCH | Freq: Once | TRANSDERMAL | Status: DC
  Administered 2016-05-06: 1.5 mg via TRANSDERMAL

## 2016-05-06 MED ORDER — GLYCOPYRROLATE 0.2 MG/ML IJ SOLN
INTRAMUSCULAR | Status: DC | PRN
  Administered 2016-05-06: 0.1 mg via INTRAVENOUS

## 2016-05-06 MED ORDER — VASOPRESSIN 20 UNIT/ML IV SOLN
INTRAVENOUS | Status: AC
  Filled 2016-05-06: qty 1

## 2016-05-06 MED ORDER — PROPOFOL 10 MG/ML IV BOLUS
INTRAVENOUS | Status: DC | PRN
  Administered 2016-05-06: 20 mg via INTRAVENOUS
  Administered 2016-05-06: 200 mg via INTRAVENOUS

## 2016-05-06 MED ORDER — SODIUM CHLORIDE 0.9 % IJ SOLN
INTRAMUSCULAR | Status: AC
  Filled 2016-05-06: qty 50

## 2016-05-06 MED ORDER — SUGAMMADEX SODIUM 500 MG/5ML IV SOLN
INTRAVENOUS | Status: AC
  Filled 2016-05-06: qty 5

## 2016-05-06 MED ORDER — FENTANYL CITRATE (PF) 250 MCG/5ML IJ SOLN
INTRAMUSCULAR | Status: DC | PRN
  Administered 2016-05-06: 100 ug via INTRAVENOUS

## 2016-05-06 MED ORDER — PROMETHAZINE HCL 25 MG/ML IJ SOLN: 6.2500 mg | INTRAMUSCULAR | Status: DC | PRN

## 2016-05-06 MED ORDER — ONDANSETRON HCL 4 MG/2ML IJ SOLN
INTRAMUSCULAR | Status: AC
  Filled 2016-05-06: qty 2

## 2016-05-06 MED ORDER — SODIUM CHLORIDE 0.9 % IJ SOLN
INTRAMUSCULAR | Status: DC | PRN
  Administered 2016-05-06: 30 mL

## 2016-05-06 MED ORDER — LIDOCAINE HCL (CARDIAC) 20 MG/ML IV SOLN
INTRAVENOUS | Status: AC
  Filled 2016-05-06: qty 5

## 2016-05-06 MED ORDER — LACTATED RINGERS IV SOLN
INTRAVENOUS | Status: DC
  Administered 2016-05-06 (×2): via INTRAVENOUS

## 2016-05-06 MED ORDER — LIDOCAINE HCL (CARDIAC) 20 MG/ML IV SOLN
INTRAVENOUS | Status: DC | PRN
  Administered 2016-05-06: 100 mg via INTRATRACHEAL
  Administered 2016-05-06: 100 mg via INTRAVENOUS

## 2016-05-06 MED ORDER — SODIUM CHLORIDE 0.9 % IR SOLN
Status: DC | PRN
  Administered 2016-05-06: 3000 mL

## 2016-05-06 MED ORDER — FENTANYL CITRATE (PF) 100 MCG/2ML IJ SOLN
25.0000 ug | INTRAMUSCULAR | Status: DC | PRN
  Administered 2016-05-06: 50 ug via INTRAVENOUS

## 2016-05-06 MED ORDER — ONDANSETRON HCL 4 MG/2ML IJ SOLN
INTRAMUSCULAR | Status: DC | PRN
  Administered 2016-05-06: 4 mg via INTRAVENOUS

## 2016-05-06 MED ORDER — SUGAMMADEX SODIUM 500 MG/5ML IV SOLN
INTRAVENOUS | Status: DC | PRN
  Administered 2016-05-06: 500 mg via INTRAVENOUS

## 2016-05-06 MED ORDER — LIDOCAINE-EPINEPHRINE 1 %-1:100000 IJ SOLN
INTRAMUSCULAR | Status: AC
  Filled 2016-05-06: qty 1

## 2016-05-06 MED ORDER — PROPOFOL 10 MG/ML IV BOLUS
INTRAVENOUS | Status: AC
  Filled 2016-05-06: qty 40

## 2016-05-06 MED ORDER — SCOPOLAMINE 1 MG/3DAYS TD PT72
MEDICATED_PATCH | TRANSDERMAL | Status: AC
  Filled 2016-05-06: qty 1

## 2016-05-06 MED ORDER — SUGAMMADEX SODIUM 200 MG/2ML IV SOLN
INTRAVENOUS | Status: DC | PRN
  Administered 2016-05-06: 45 mg via INTRAVENOUS

## 2016-05-06 MED ORDER — VECURONIUM BROMIDE 10 MG IV SOLR
INTRAVENOUS | Status: DC | PRN
  Administered 2016-05-06: 50 mg via INTRAVENOUS

## 2016-05-06 MED ORDER — FENTANYL CITRATE (PF) 100 MCG/2ML IJ SOLN
INTRAMUSCULAR | Status: AC
  Filled 2016-05-06: qty 2

## 2016-05-06 MED ORDER — PROPOFOL 10 MG/ML IV BOLUS
INTRAVENOUS | Status: AC
  Filled 2016-05-06: qty 20

## 2016-05-06 MED ORDER — LIDOCAINE-EPINEPHRINE (PF) 1 %-1:200000 IJ SOLN
INTRAMUSCULAR | Status: AC
  Filled 2016-05-06: qty 30

## 2016-05-06 MED ORDER — FENTANYL CITRATE (PF) 100 MCG/2ML IJ SOLN
INTRAMUSCULAR | Status: AC
  Filled 2016-05-06: qty 4

## 2016-05-06 SURGICAL SUPPLY — 24 items
CANISTERS HI-FLOW 3000CC (CANNISTER) ×3 IMPLANT
CATH FOLEY 2WAY SLVR 30CC 16FR (CATHETERS) IMPLANT
CATH ROBINSON RED A/P 16FR (CATHETERS) ×3 IMPLANT
CLOTH BEACON ORANGE TIMEOUT ST (SAFETY) ×3 IMPLANT
CONTAINER PREFILL 10% NBF 60ML (FORM) ×6 IMPLANT
DEVICE MYOSURE LITE (MISCELLANEOUS) ×3 IMPLANT
DEVICE MYOSURE REACH (MISCELLANEOUS) IMPLANT
FILTER ARTHROSCOPY CONVERTOR (FILTER) ×3 IMPLANT
GLOVE BIOGEL PI IND STRL 7.0 (GLOVE) ×1 IMPLANT
GLOVE BIOGEL PI IND STRL 8 (GLOVE) ×1 IMPLANT
GLOVE BIOGEL PI INDICATOR 7.0 (GLOVE) ×2
GLOVE BIOGEL PI INDICATOR 8 (GLOVE) ×2
GLOVE ECLIPSE 7.5 STRL STRAW (GLOVE) ×6 IMPLANT
GOWN STRL REUS W/TWL LRG LVL3 (GOWN DISPOSABLE) ×6 IMPLANT
PACK VAGINAL MINOR WOMEN LF (CUSTOM PROCEDURE TRAY) ×3 IMPLANT
PAD OB MATERNITY 4.3X12.25 (PERSONAL CARE ITEMS) ×3 IMPLANT
PAD PREP 24X48 CUFFED NSTRL (MISCELLANEOUS) ×3 IMPLANT
PLUG CATH AND CAP STER (CATHETERS) IMPLANT
SEAL ROD LENS SCOPE MYOSURE (ABLATOR) ×3 IMPLANT
SYR 30ML LL (SYRINGE) IMPLANT
TOWEL OR 17X24 6PK STRL BLUE (TOWEL DISPOSABLE) ×6 IMPLANT
TUBING AQUILEX INFLOW (TUBING) ×3 IMPLANT
TUBING AQUILEX OUTFLOW (TUBING) ×3 IMPLANT
WATER STERILE IRR 1000ML POUR (IV SOLUTION) ×3 IMPLANT

## 2016-05-06 NOTE — Discharge Instructions (Addendum)
DISCHARGE INSTRUCTIONS: D&C / D&E The following instructions have been prepared to help you care for yourself upon your return home.   Personal hygiene:  Use sanitary pads for vaginal drainage, not tampons.  Shower the day after your procedure.  NO tub baths, pools or Jacuzzis for 2-3 weeks.  Wipe front to back after using the bathroom.  Activity and limitations:  Do NOT drive or operate any equipment for 24 hours. The effects of anesthesia are still present and drowsiness may result.  Do NOT rest in bed all day.  Walking is encouraged.  Walk up and down stairs slowly.  You may resume your normal activity in one to two days or as indicated by your physician.  Sexual activity: NO intercourse for at least 2 weeks after the procedure, or as indicated by your physician.  Diet: Eat a light meal as desired this evening. You may resume your usual diet tomorrow.  Return to work: You may resume your work activities in one to two days or as indicated by your doctor.  What to expect after your surgery: Expect to have vaginal bleeding/discharge for 2-3 days and spotting for up to 10 days. It is not unusual to have soreness for up to 1-2 weeks. You may have a slight burning sensation when you urinate for the first day. Mild cramps may continue for a couple of days. You may have a regular period in 2-6 weeks.  Call your doctor for any of the following:  Excessive vaginal bleeding, saturating and changing one pad every hour.  Inability to urinate 6 hours after discharge from hospital.  Pain not relieved by pain medication.  Fever of 100.4 F or greater.  Unusual vaginal discharge or odor.   Call for an appointment:    Patients signature: ______________________  Nurses signature ________________________  Support person's signature_______________________    Post Anesthesia Home Care Instructions  Activity: Get plenty of rest for the remainder of the day. A responsible  adult should stay with you for 24 hours following the procedure.  For the next 24 hours, DO NOT: -Drive a car -Paediatric nurse -Drink alcoholic beverages -Take any medication unless instructed by your physician -Make any legal decisions or sign important papers.  Meals: Start with liquid foods such as gelatin or soup. Progress to regular foods as tolerated. Avoid greasy, spicy, heavy foods. If nausea and/or vomiting occur, drink only clear liquids until the nausea and/or vomiting subsides. Call your physician if vomiting continues.  Special Instructions/Symptoms: Your throat may feel dry or sore from the anesthesia or the breathing tube placed in your throat during surgery. If this causes discomfort, gargle with warm salt water. The discomfort should disappear within 24 hours.  If you had a scopolamine patch placed behind your ear for the management of post- operative nausea and/or vomiting:  1. The medication in the patch is effective for 72 hours, after which it should be removed.  Wrap patch in a tissue and discard in the trash. Wash hands thoroughly with soap and water. 2. You may remove the patch earlier than 72 hours if you experience unpleasant side effects which may include dry mouth, dizziness or visual disturbances. 3. Avoid touching the patch. Wash your hands with soap and water after contact with the patch.

## 2016-05-06 NOTE — Anesthesia Procedure Notes (Signed)
Procedure Name: Intubation Date/Time: 05/06/2016 9:44 AM Performed by: Flossie Dibble Pre-anesthesia Checklist: Patient identified, Emergency Drugs available, Suction available, Patient being monitored and Timeout performed Patient Re-evaluated:Patient Re-evaluated prior to inductionOxygen Delivery Method: Circle system utilized Preoxygenation: Pre-oxygenation with 100% oxygen Intubation Type: IV induction Ventilation: Mask ventilation without difficulty Laryngoscope Size: Glidescope (LoPro3) Grade View: Grade I Tube type: Oral Tube size: 7.0 mm Number of attempts: 1 (Intubated by Dr. Lyndle Herrlich.) Airway Equipment and Method: Patient positioned with wedge pillow and Stylet Placement Confirmation: ETT inserted through vocal cords under direct vision,  positive ETCO2 and breath sounds checked- equal and bilateral Secured at: 21.5 cm Tube secured with: Tape Dental Injury: Teeth and Oropharynx as per pre-operative assessment

## 2016-05-06 NOTE — Transfer of Care (Signed)
Immediate Anesthesia Transfer of Care Note  Patient: Hailey Barnes  Procedure(s) Performed: Procedure(s) with comments: [place] (N/A) - request to follow around 8:45  requests one hour OR time  Patient Location: PACU  Anesthesia Type:General  Level of Consciousness: awake, alert  and oriented  Airway & Oxygen Therapy: Patient Spontanous Breathing and Patient connected to face mask oxygen  Post-op Assessment: Report given to RN and Post -op Vital signs reviewed and stable  Post vital signs: Reviewed and stable  Last Vitals:  Filed Vitals:   05/06/16 0751  BP: 150/97  Pulse: 79  Temp: 36.6 C  Resp: 16    Last Pain: There were no vitals filed for this visit.    Patients Stated Pain Goal: 3 (02/33/43 5686)  Complications: No apparent anesthesia complications

## 2016-05-06 NOTE — Op Note (Addendum)
   Operative Note  05/06/2016  12:18 PM  PATIENT:  Hailey Barnes  55 y.o. female  PRE-OPERATIVE DIAGNOSIS:  dysfunctional uterine bleeding  POST-OPERATIVE DIAGNOSIS: Dysfunctional uterine bleeding, endometrial polyps  PROCEDURE:  Procedure(s): DILATATION & CURETTAGE/HYSTEROSCOPY WITH MYOSURE  SURGEON:  Surgeon(s): Terrance Mass, MD  ANESTHESIA:   general  FINDINGS:FINDINGS: Several small endometrial polyps lower uterine segment but mostly lush endometrium   DESCRIPTION OF OPERATION:The patient was taken to the operating room where she underwent a successful general endotracheal anesthesia. Patient had PAS stockings for DVT prophylaxis. She received 2 g of Ancef preop. Time out was undertaken to properly identify the patient and the proper operation schedule. The vagina and perineum were prepped and draped in usual sterile fashion. A red rubber Quentin Cornwall was inserted to empty the bladder is content for approximately 50 cc. Bimanual examination demonstrated an anteverted uterus. Patient's legs were in the high lithotomy position. A weighted speculum was placed in the posterior vaginal vault. A single-tooth tenaculum was placed on the anterior cervical lip. Pitressin was infiltrated into the cervical stroma at the 2, 4, 8, and 10:00 position for a total 15 cc The uterus sounded to 7-1/2 cm. Pratt dilator were used to dilate the cervical canal to a 15 mm size. The Hologic Myosure resectoscopic morcellator with a scope of 6.25 mm and an operating blade of 3.0 mm was introduced into the intrauterine cavity. 0.9% normal saline was the distending media. Inspection of the endometrial cavity demonstrated multiple small uterine polyps located in the lower uterine segment. With the resectoscopic morcellator they were removed and submitted for histological evaluation. Fluid deficit was approximately 1155 cc.The single-tooth tenaculum was removed patient was extubated transferred to recovery room stable  vital signs blood loss was minimal. She received 30 mg of Toradol in route to the recovery room.    ESTIMATED BLOOD LOSS: Minimal  Intake/Output Summary (Last 24 hours) at 05/06/16 1218 Last data filed at 05/06/16 1038  Gross per 24 hour  Intake   1700 ml  Output     60 ml  Net   1640 ml     BLOOD ADMINISTERED:none   LOCAL MEDICATIONS USED:  OTHER Pitressin 5 units in 30 cc of normal saline for a total 15 cc paracervical  SPECIMEN:  Source of Specimen:  Endometrial curettings  DISPOSITION OF SPECIMEN:  PATHOLOGY  COUNTS:  YES  PLAN OF CARE: Transfer to [place] HMD12:18 PMTD@

## 2016-05-06 NOTE — Anesthesia Postprocedure Evaluation (Signed)
Anesthesia Post Note  Patient: Hailey Barnes  Procedure(s) Performed: Procedure(s) (LRB): DILATATION & CURETTAGE/HYSTEROSCOPY WITH MYOSURE (N/A)  Patient location during evaluation: PACU Anesthesia Type: General Level of consciousness: awake and alert Pain management: pain level controlled Vital Signs Assessment: post-procedure vital signs reviewed and stable Respiratory status: spontaneous breathing, nonlabored ventilation, respiratory function stable and patient connected to nasal cannula oxygen Cardiovascular status: blood pressure returned to baseline and stable Postop Assessment: no signs of nausea or vomiting Anesthetic complications: no     Last Vitals:  Filed Vitals:   05/06/16 0751  BP: 150/97  Pulse: 79  Temp: 36.6 C  Resp: 16    Last Pain: There were no vitals filed for this visit. Pain Goal: Patients Stated Pain Goal: 3 (05/06/16 0751)               Zenaida Deed

## 2016-05-06 NOTE — H&P (View-Only) (Signed)
Hailey Barnes is an 55 y.o. female who presented to the office today for sonohysterogram as part of her workup for dysfunction uterine bleeding and for preoperative consultation. Her history is as follows:  Patient was seen as a new patient in June 16 for the first time as a new patient. She had not seen a gynecologist in over 10 years although she had been followed at the [facility] family practice clinic may have been following her for her hypertension and type 2 diabetes. Patient also many years ago was a crack cocaine addict and she's been free from drugs for several years now. She was seen at the family practice clinic approximate 2 weeks ago and was treated for trichomoniasis. Review of her labs indicated that she had a negative RPR and HIV and GC and Chlamydia culture back pain. She had been complaining of postcoital bleeding when she was sexually active. She does report many years ago she's had abdominal myomectomy and C-sections as well. She has never been on a hormonal replacement therapy she stated her last menstrual period was about 6 months ago. When she was sexually active she use condoms on and off. She has not had a mammogram since age 40. She reports some form of abnormal Pap smear in the past but does not recall any specific treatment we have no records of that.. She has not had a colonoscopy.  Patient's PCP had ordered an ultrasound due to patient's vaginal bleeding and the following was reported by the radiologist: IMPRESSION: 1. The endometrial stripe measures 14.7 cm. This is prominent but within normal limits for a patient not yet in menopause. This would be abnormal after menopause however. Recommend follow-up as clinically warranted. 2. There is a simple cystic follicle in the left ovary requiring no additional follow-up. 3. The tubular fluid-filled structure the right adnexum could be a Hydrosalpinx.  Wet prep today before the sonohysterogram few bacteria few white blood  cells but no clue cells no Trichomonas. Patient had an endometrial biopsy last visit and the following was noted: Diagnosis Endometrium, biopsy - BENIGN ENDOMETRIAL TYPE POLYP.  Complete STD consisting of GC and chlamydia culture HIV, RPR, hepatitis B and C done at last office visit was negative.  Ultrasound today/sono hysterogram: Uterus measured 8.2 x 5.5 x 5.0 cm endometrial stripe 11.4 mm. Intramural fibroid 15 x 13 mm, 13 x 11 mm. Right ovary with a serpentine tubular cystic structure negative color flow measured 5.0 x 2.4 x 1.9 cm. Left ovarian follicle 24 x 18 mm. A tubular serpentine structure surrounding the left ovary measuring 3.6 x 1.4 x 2.8 cm was noted negative fluid in the cul-de-sac.  Pertinent Gynecological History: Menses: Intermenstrual bleeding Bleeding: intermenstrual bleeding Contraception: none DES exposure: unknown Blood transfusions: none Sexually transmitted diseases: Gonorrhea, chlamydia, Trichomonas Previous GYN Procedures: D&C 2 also myomectomy and cesarean section  Last mammogram: normal Date: 2012 Last pap: normal Date: 2015 OB History: G 1, P 1   Menstrual History: Menarche age: 68 Patient's last menstrual period was 08/31/2015.    Past Medical History  Diagnosis Date  . 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   Marland Kitchen Hypertension   . COPD (chronic obstructive pulmonary disease) ([place])   . Tingling in extremities 12/12/2010    Uric acid and electrolytes (02/23) normal but WBC elevated.   D/Dx: carpal tunnel, ulnar neuropathy Less likely: cervical radiculopathy, vasculitis   . CHF (congestive heart failure) ([place])   .  Chronic bronchitis ([place])     "get it q yr" (02/21/2014)  . Arthritis     "knees" (02/21/2014)  . Complication of anesthesia     "I don't come out well; I chew on my tongue"  . Heart murmur   . GERD (gastroesophageal reflux disease)   . Migraine 1982-2009  . Stroke [facility]) noted on CAT 02/2014    "light", LLE  weakness remains (03/30/2014)  . Chronic lower back pain   . Shortness of breath   . Fibroid     Past Surgical History  Procedure Laterality Date  . Cesarean section  1982  . Foot surgery Bilateral     "took bones out; put pins in"  . Left and right heart catheterization with coronary angiogram N/A 03/31/2014    Procedure: LEFT AND RIGHT HEART CATHETERIZATION WITH CORONARY ANGIOGRAM;  Surgeon: Birdie Riddle, MD;  Location: [facility] CATH [facility];  Service: Cardiovascular;  Laterality: N/A;    Family History  Problem Relation Age of Onset  . Hypertension Mother   . Hypertension Father   . Cancer Sister     UTERINE???    Social History:  reports that she has been smoking Cigarettes.  She has a 9.25 pack-year smoking history. She has never used smokeless tobacco. She reports that she uses illicit drugs ("Crack" cocaine). She reports that she does not drink alcohol.  Allergies: No Known Allergies   (Not in a hospital admission)  REVIEW OF SYSTEMS: A ROS was performed and pertinent positives and negatives are included in the history.  GENERAL: No fevers or chills. HEENT: No change in vision, no earache, sore throat or sinus congestion. NECK: No pain or stiffness. CARDIOVASCULAR: No chest pain or pressure. No palpitations. PULMONARY: No shortness of breath, cough or wheeze. GASTROINTESTINAL: No abdominal pain, nausea, vomiting or diarrhea, melena or bright red blood per rectum. GENITOURINARY: No urinary frequency, urgency, hesitancy or dysuria. MUSCULOSKELETAL: No joint or muscle pain, no back pain, no recent trauma. DERMATOLOGIC: No rash, no itching, no lesions. ENDOCRINE: No polyuria, polydipsia, no heat or cold intolerance. No recent change in weight. HEMATOLOGICAL: No anemia or easy bruising or bleeding. NEUROLOGIC: No headache, seizures, numbness, tingling or weakness. PSYCHIATRIC: No depression, no loss of interest in normal activity or change in sleep pattern.     Blood pressure 140/86, last  menstrual period 08/31/2015.  Physical Exam:  HEENT:unremarkable Neck:Supple, midline, no thyroid megaly, no carotid bruits Lungs:  Clear to auscultation no rhonchi's or wheezes Heart:Regular rate and rhythm, no murmurs or gallops Breast Exam: Symmetrical in appearance no palpable mass or tenderness no supraclavicular axillary lymphadenopathy Abdomen: Soft nontender no rebound or guarding Pelvic:BUS within normal limits Vagina: No lesions or discharge Cervix: No lesions or discharge Uterus: Difficult to assess due to patient's abdominal girth and pendulous abdomen Adnexa: Same as above Extremities: No cords, no edema Rectal: Not done   Assessment/Plan: Patient with dysfunction uterine bleeding attributed to endometrial polyps. 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 Patient with one small intramural fibroid. Patient with multiple comorbidities an outpatient simple surgical procedure consisting a resectoscopic polypectomy to help with her dysfunctional bleeding will be scheduled. The following risk benefits and pros and cons were discussed:                        Patient was counseled as to the risk of surgery to include the following:  1. Infection (prohylactic  antibiotics will be administered)  2. DVT/Pulmonary Embolism (prophylactic pneumo compression stockings will be used)  3.Trauma to internal organs requiring additional surgical procedure to repair any injury to     Internal organs requiring perhaps additional hospitalization days.  4.Hemmorhage requiring transfusion and blood products which carry risks such as             anaphylactic reaction, hepatitis and AIDS  Patient had received literature information on the procedure scheduled and all her questions were answered and fully accepts all risk.  A CA 125 will be ordered today.   [facility] HMD12:18 PMTD     Telina Kleckley  H 04/14/2016, 12:10 PM

## 2016-05-06 NOTE — Interval H&P Note (Signed)
History and Physical Interval Note:  05/06/2016 9:15 AM  Hailey Barnes  has presented today for surgery, with the diagnosis of dysfunctional uterine bleeding  The various methods of treatment have been discussed with the patient and family. After consideration of risks, benefits and other options for treatment, the patient has consented to  Procedure(s) with comments: [place] (N/A) - request to follow around 8:45  requests one hour OR time as a surgical intervention .  The patient's history has been reviewed, patient examined, no change in status, stable for surgery.  I have reviewed the patient's chart and labs.  Questions were answered to the patient's satisfaction.     Terrance Mass

## 2016-05-08 ENCOUNTER — Telehealth: Payer: Self-pay

## 2016-05-08 MED ORDER — FLUCONAZOLE 150 MG PO TABS: 150.0000 mg | ORAL_TABLET | Freq: Once | ORAL | Status: DC

## 2016-05-08 NOTE — Telephone Encounter (Signed)
Patient advised. Rx sent. 

## 2016-05-08 NOTE — Telephone Encounter (Signed)
I called patient with result. She had questions about how she is feeling.   #1 She said her whole midsection is sore all the way to breasts. She said even her upper back at her left shoulder is sore.  On a 1-10 pain scale she said the "soreness" rates a 5.  It is better with being still and she notices it most with moving. She said coughing makes it worse.   #2 Her vagina it itching. I asked about any discharge but she said she is still bleeding. Very sore in vaginal area but she assumes that is from surgery.

## 2016-05-08 NOTE — Telephone Encounter (Signed)
He can call in a prescription for Diflucan 150 mg one by mouth. She can take ibuprofen when necessary any abdominal discomfort she may be experiencing. If she is not distended. Has had bowel movements, tolerating regular diet and urinating well and no fever you can reassure for now. If symptoms change or worsen to come by the office or if it is after hours to go to the emergency room. The bleeding should be subsiding less and less each day. If she has no difficulty breathing she will be fine we'll continue to monitor unless any the above manifest itself. There was no problems in her surgery all went well.

## 2016-05-08 NOTE — Telephone Encounter (Signed)
Questions: #1 is she tolerating regular diet and liquids? #2 is she passing gas? #3 that she smoked? #4 if she ambulating? #5 any fever?

## 2016-05-08 NOTE — Telephone Encounter (Signed)
I called and spoke with patient and asked Dr. Durenda Guthrie questions. #1 Yes  #2 No to passing gas but she has had several bowel movements. #3 Not since surgery. #4 Yes #5 No

## 2016-05-08 NOTE — Telephone Encounter (Signed)
-----   Message from Terrance Mass, MD sent at 05/08/2016  8:15 AM EDT ----- Please inform patient pathology report was benign

## 2016-05-09 ENCOUNTER — Encounter (HOSPITAL_COMMUNITY): Payer: Self-pay | Admitting: Gynecology

## 2016-05-21 ENCOUNTER — Other Ambulatory Visit: Payer: Self-pay | Admitting: Family Medicine

## 2016-05-21 MED ORDER — TRIAMCINOLONE ACETONIDE 0.1 % EX CREA: 1.0000 "application " | TOPICAL_CREAM | Freq: Three times a day (TID) | CUTANEOUS | 1 refills | Status: DC | PRN

## 2016-05-21 MED ORDER — HYDROCORTISONE 1 % EX OINT: 1.0000 "application " | TOPICAL_OINTMENT | Freq: Two times a day (BID) | CUTANEOUS | 1 refills | Status: DC | PRN

## 2016-05-21 NOTE — Telephone Encounter (Signed)
Rx filled.  [name]. [name], [place] Resident PGY-3 05/21/2016 1:49 PM

## 2016-05-21 NOTE — Telephone Encounter (Signed)
Pt would like refills on hydrocortisone ointment and triamcinolone cream. Pt would like them called in to the  [place] on [address].Please call pt after they have been called in. Thanks! ep

## 2016-05-22 ENCOUNTER — Ambulatory Visit (INDEPENDENT_AMBULATORY_CARE_PROVIDER_SITE_OTHER): Payer: Medicare Other | Admitting: Gynecology

## 2016-05-22 ENCOUNTER — Encounter: Payer: Self-pay | Admitting: Gynecology

## 2016-05-22 VITALS — BP 136/90

## 2016-05-22 DIAGNOSIS — L292 Pruritus vulvae: Secondary | ICD-10-CM

## 2016-05-22 DIAGNOSIS — L304 Erythema intertrigo: Secondary | ICD-10-CM

## 2016-05-22 DIAGNOSIS — Z09 Encounter for follow-up examination after completed treatment for conditions other than malignant neoplasm: Secondary | ICD-10-CM

## 2016-05-22 LAB — WET PREP FOR TRICH, YEAST, CLUE
Clue Cells Wet Prep HPF POC: NONE SEEN
Trich, Wet Prep: NONE SEEN
YEAST WET PREP: NONE SEEN

## 2016-05-22 MED ORDER — METRONIDAZOLE 0.75 % VA GEL: 1.0000 | Freq: Two times a day (BID) | VAGINAL | 0 refills | Status: DC

## 2016-05-22 MED ORDER — FLUCONAZOLE 150 MG PO TABS: 150.0000 mg | ORAL_TABLET | Freq: Once | ORAL | 0 refills | Status: AC

## 2016-05-22 NOTE — Patient Instructions (Addendum)
Intertrigo Intertrigo is a skin condition that occurs in between folds of skin in places on the body that rub together a lot and do not get much ventilation. It is caused by heat, moisture, friction, sweat retention, and lack of air circulation, which produces red, irritated patches and, sometimes, scaling or drainage. People who have diabetes, who are obese, or who have treatment with antibiotics are at increased risk for intertrigo. The most common sites for intertrigo to occur include:  The groin.  The breasts.  The armpits.  Folds of abdominal skin.  Webbed spaces between the fingers or toes. Intertrigo may be aggravated by:  Sweat.  Feces.  Yeast or bacteria that are present near skin folds.  Urine.  Vaginal discharge. HOME CARE INSTRUCTIONS  The following steps can be taken to reduce friction and keep the affected area cool and dry:  Expose skin folds to the air.  Keep deep skin folds separated with cotton or linen cloth. Avoid tight fitting clothing that could cause chafing.  Wear open-toed shoes or sandals to help reduce moisture between the toes.  Apply absorbent powders to affected areas as directed by your caregiver.  Apply over-the-counter barrier pastes, such as zinc oxide, as directed by your caregiver.  If you develop a fungal infection in the affected area, your caregiver may have you use antifungal creams. SEEK MEDICAL CARE IF:   The rash is not improving after 1 week of treatment.  The rash is getting worse (more red, more swollen, more painful, or spreading).  You have a fever or chills. MAKE SURE YOU:   Understand these instructions.  Will watch your condition.  Will get help right away if you are not doing well or get worse.   This information is not intended to replace advice given to you by your health care provider. Make sure you discuss any questions you have with your health care provider.   Document Released: 10/06/2005 Document  Revised: 12/29/2011 Document Reviewed: 04/09/2015 Elsevier Interactive Patient Education 2016 Elsevier Inc. Metronidazole vaginal gel What is this medicine? METRONIDAZOLE (me troe NI da zole) VAGINAL GEL is an antiinfective. It is used to treat bacterial vaginitis. This medicine may be used for other purposes; ask your health care provider or pharmacist if you have questions. What should I tell my health care provider before I take this medicine? They need to know if you have any of these conditions: -if you drink alcohol containing drinks -if you have taken disulfiram in the past two weeks -liver disease -peripheral neuropathy -seizures -an unusual or allergic reaction to metronidazole, parabens, nitroimidazoles, or other medicines, foods, dyes, or preservatives -pregnant or trying to get pregnant -breast-feeding How should I use this medicine? This medicine is only for use in the vagina. Do not take by mouth or apply to other areas of the body. Follow the directions on the prescription label. Wash hands before and after use. Screw the applicator to the tube and squeeze the tube gently to fill the applicator. Lie on your back, part and bend your knees. Insert the applicator tip high in the vagina and push the plunger to release the gel into the vagina. Gently remove the applicator. Wash the applicator well with warm water and soap. Use at regular intervals. Finish the full course prescribed by your doctor or health care professional even if you think your condition is better. Do not stop using except on the advice of your doctor or health care professional. Talk to your pediatrician regarding  the use of this medicine in children. Special care may be needed. Overdosage: If you think you have taken too much of this medicine contact a poison control center or emergency room at once. NOTE: This medicine is only for you. Do not share this medicine with others. What if I miss a dose? If you miss a  dose, use it as soon as you can. If it is almost time for your next dose, use only that dose. Do not use double or extra doses. What may interact with this medicine? Do not take this medicine with any of the following medications: -alcohol or any product that contains alcohol -cisapride -dofetilide -dronedarone -pimozide -thioridazine -ziprasidone This medicine may also interact with the following medications: -cimetidine -lithium -other medicines that prolong the QT interval (cause an abnormal heart rhythm) -warfarin This list may not describe all possible interactions. Give your health care provider a list of all the medicines, herbs, non-prescription drugs, or dietary supplements you use. Also tell them if you smoke, drink alcohol, or use illegal drugs. Some items may interact with your medicine. What should I watch for while using this medicine? Tell your doctor or health care professional if your symptoms do not start to get better in 2 or 3 days. Avoid alcoholic drinks while you are taking this medicine and for three days afterwards. Alcohol may make you feel dizzy, sick, or flushed. You may get drowsy or dizzy. Do not drive, use machinery, or do anything that needs mental alertness until you know how this medicine affects you. To reduce the risk of dizzy or fainting spells, do not sit or stand up quickly, especially if you are an older patient. Your clothing may get soiled if you have a vaginal discharge. You can wear a sanitary napkin. Do not use tampons. Wear freshly washed cotton, not synthetic, panties. Do not have sex until you have finished your treatment. Having sex can make the treatment less effective. Your sexual partner may also need treatment. What side effects may I notice from receiving this medicine? Side effects that you should report to your doctor or health care professional as soon as possible: -dizziness -frequent passing of urine -headache -loss of  appetite -nausea -skin rash, itching -stomach pain or cramps -vaginal irritation or discharge -vulvar burning or swelling Side effects that usually do not require medical attention (report to your doctor or health care professional if they continue or are bothersome): -dark urine -mild vaginal burning This list may not describe all possible side effects. Call your doctor for medical advice about side effects. You may report side effects to FDA at 1-800-FDA-1088. Where should I keep my medicine? Keep out of the reach of children. Store at room temperature between 15 and 30 degrees C (59 and 86 degrees F). Do not freeze. Throw away any unused medicine after the expiration date. NOTE: This sheet is a summary. It may not cover all possible information. If you have questions about this medicine, talk to your doctor, pharmacist, or health care provider.    2016, Elsevier/Gold Standard. (2013-05-13 14:09:23)

## 2016-05-22 NOTE — Progress Notes (Signed)
   HPI: Presented to the office for postop visit that had complaints of vulvar pruritus and burning inside her vagina. She is otherwise done well from her surgery. She had resectoscopic myomectomy for dysfunctional uterine bleeding. Pictures from her surgery as well as pathology report were shared with the patient.  Pathology report demonstrated the following:  Diagnosis Endometrium, curettage, with polyp SCANT INACTIVE ENDOMETRIUM FRAGMENTS OF SUBMUCOSA LEIOMYOMA  Patient has no GU or GI complaints.  ROS: A ROS was performed and pertinent positives and negatives are included in the history.  GENERAL: No fevers or chills. HEENT: No change in vision, no earache, sore throat or sinus congestion. NECK: No pain or stiffness. CARDIOVASCULAR: No chest pain or pressure. No palpitations. PULMONARY: No shortness of breath, cough or wheeze. GASTROINTESTINAL: No abdominal pain, nausea, vomiting or diarrhea, melena or bright red blood per rectum. GENITOURINARY: No urinary frequency, urgency, hesitancy or dysuria. MUSCULOSKELETAL: No joint or muscle pain, no back pain, no recent trauma. DERMATOLOGIC: No rash, no itching, no lesions. ENDOCRINE: No polyuria, polydipsia, no heat or cold intolerance. No recent change in weight. HEMATOLOGICAL: No anemia or easy bruising or bleeding. NEUROLOGIC: No headache, seizures, numbness, tingling or weakness. PSYCHIATRIC: No depression, no loss of interest in normal activity or change in sleep pattern.   PE: Blood pressure 136/90 Morbidly obese patient with the above-mentioned complaining Abdomen: Soft nontender no rebound or guarding Pelvic: Bartholin urethra Skene glands within normal limits Some excoriated area was noted around the left groin crease area consistent with intertrigo Pelvic exam: Vagina: No lesions or discharge Cervix: No lesions or discharge Uterus: Anteverted normal size shape and consistency Adnexa: No palpable mass or tenderness although limited due  to patient Dominick girth Rectal exam not done  Wet prep was essentially negative    Assessment Plan: Morbidly obese patient was done well from her surgery due to her size she has intertrigo on her left groin crease I'm going to call her prescription for Diflucan 150 mg take 1 by mouth today she will hold off on her statin tonight as well as tomorrow and not restarted until Saturday. Because of her vaginal irritation M going to call and also prescription for MetroGel to apply twice a day for 7 days. After the 7 days are up she can use Replens vaginal moisturizer twice a week when necessary. She may resume intercourse after 1 week.   Greater than 50% of time was spent in counseling and coordinating care of this patient.   Time of consultation: 15   Minutes in additional to her routine postop visit

## 2016-05-26 ENCOUNTER — Other Ambulatory Visit: Payer: Self-pay | Admitting: Family Medicine

## 2016-05-26 DIAGNOSIS — I10 Essential (primary) hypertension: Secondary | ICD-10-CM

## 2016-06-02 ENCOUNTER — Encounter: Payer: Self-pay | Admitting: Family Medicine

## 2016-06-02 ENCOUNTER — Encounter: Payer: BLUE CROSS/BLUE SHIELD | Admitting: Sports Medicine

## 2016-06-02 ENCOUNTER — Ambulatory Visit (INDEPENDENT_AMBULATORY_CARE_PROVIDER_SITE_OTHER): Payer: Medicare Other | Admitting: Family Medicine

## 2016-06-02 DIAGNOSIS — M5416 Radiculopathy, lumbar region: Secondary | ICD-10-CM | POA: Diagnosis not present

## 2016-06-02 NOTE — Patient Instructions (Signed)
Thank you for coming in today. You should hear from [facility] soon about the injection  Call my office if you do not hear anything,.
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
## 2016-06-05 ENCOUNTER — Encounter: Payer: Self-pay | Admitting: Gynecology

## 2016-06-05 DIAGNOSIS — Z1231 Encounter for screening mammogram for malignant neoplasm of breast: Secondary | ICD-10-CM | POA: Diagnosis not present

## 2016-06-10 NOTE — Progress Notes (Signed)
Left VM for GI to schedule epidural.

## 2016-06-18 ENCOUNTER — Ambulatory Visit
Admission: RE | Admit: 2016-06-18 | Discharge: 2016-06-18 | Disposition: A | Discharge status: Home / Self Care | Payer: Medicare Other | Source: Ambulatory Visit | Attending: Family Medicine | Admitting: Family Medicine

## 2016-06-18 VITALS — BP 195/119 | HR 74

## 2016-06-18 DIAGNOSIS — M5126 Other intervertebral disc displacement, lumbar region: Secondary | ICD-10-CM | POA: Diagnosis not present

## 2016-06-18 DIAGNOSIS — M5416 Radiculopathy, lumbar region: Secondary | ICD-10-CM

## 2016-06-18 MED ORDER — METHYLPREDNISOLONE ACETATE 40 MG/ML INJ SUSP (RADIOLOG
120.0000 mg | Freq: Once | INTRAMUSCULAR | Status: AC
  Administered 2016-06-18: 120 mg via EPIDURAL

## 2016-06-18 MED ORDER — IOPAMIDOL (ISOVUE-M 200) INJECTION 41%
1.0000 mL | Freq: Once | INTRAMUSCULAR | Status: AC
  Administered 2016-06-18: 1 mL via EPIDURAL

## 2016-06-18 NOTE — Discharge Instructions (Signed)

## 2016-07-03 ENCOUNTER — Ambulatory Visit (INDEPENDENT_AMBULATORY_CARE_PROVIDER_SITE_OTHER): Payer: Medicare Other | Admitting: Family Medicine

## 2016-07-03 ENCOUNTER — Encounter: Payer: Self-pay | Admitting: Family Medicine

## 2016-07-03 VITALS — BP 162/102 | HR 78 | Wt 298.0 lb

## 2016-07-03 DIAGNOSIS — M5416 Radiculopathy, lumbar region: Secondary | ICD-10-CM

## 2016-07-03 DIAGNOSIS — M25561 Pain in right knee: Secondary | ICD-10-CM | POA: Diagnosis not present

## 2016-07-03 DIAGNOSIS — M25562 Pain in left knee: Secondary | ICD-10-CM

## 2016-07-03 NOTE — Patient Instructions (Signed)
Thank you for coming in today. Call our office if your leg pain gets worse and we can order another injection. Schedule an appointment with me if your knee pain gets worse for possible steroid injections.

## 2016-07-03 NOTE — Progress Notes (Signed)
Hailey Barnes is a 55 y.o. female who presents to [facility] [place] today for follow up of lumbar radiculopathy.  Patient has a history of multilevel degenerative disc disease with lumbar radiculopathy in an L4 distribution bilaterally, with right leg worse than left.  She has experienced extensive treatment in the past and underwent an epidural steroid injection 2 weeks ago.  Since then she reports significant improvement in her right leg symptoms and back pain.  She endorses occasional left leg radicular pain but claims it doesn't bother her very much.  Denies focal weakness or numbness.  Patient also complains of chronic bilateral anterior knee pain.  She reports being told in the past by an orthopedist that her knees were "bone on bone".  She describes the pain as an ache and claims it is worse when going up stairs.     Past Medical History:  Diagnosis Date  . Arthritis    "knees" (02/21/2014), back  . CHF (congestive heart failure) ([place])   . Chronic bronchitis ([place])    "get it q yr" (02/21/2014)  . Chronic lower back pain   . Complication of anesthesia    "I don't come out well; I chew on my tongue"  . COPD (chronic obstructive pulmonary disease) ([place])   . Diabetes mellitus without complication ([place])   . Fibroid   . GERD (gastroesophageal reflux disease)   . Heart murmur   . Hypertension   . Infection of right eye    current dx on Saturday 7/15 - using drops  . Migraine 1982-2009  . MVP (mitral valve prolapse)   . Neuromuscular disorder ([place])    right leg nerve pain  . Shortness of breath   . Sleep apnea 02/2016   recent hospitalization noted apnea during stay no sleep studt performed since this episode  . Stroke [facility]) noted on CAT 02/2014   "light", LLE weakness remains (03/30/2014)  . 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 relapse 2 years  clean  . Tingling in extremities 12/12/2010   Uric acid and electrolytes (02/23) normal but WBC elevated.   D/Dx: carpal tunnel, ulnar neuropathy Less likely: cervical radiculopathy, vasculitis    Past Surgical History:  Procedure Laterality Date  . [place]  . DILATATION & CURETTAGE/HYSTEROSCOPY WITH MYOSURE N/A 05/06/2016   Procedure: DILATATION & CURETTAGE/HYSTEROSCOPY WITH MYOSURE;  Surgeon: Terrance Mass, MD;  Location: [facility];  Service: Gynecology;  Laterality: N/A;  request to follow around 8:45  requests one hour OR time  . DILATION AND CURETTAGE OF UTERUS    . FOOT SURGERY Bilateral    "took bones out; put pins in"  . LEFT AND RIGHT HEART CATHETERIZATION WITH CORONARY ANGIOGRAM N/A 03/31/2014   Procedure: LEFT AND RIGHT HEART CATHETERIZATION WITH CORONARY ANGIOGRAM;  Surgeon: Birdie Riddle, MD;  Location: [facility] CATH [facility];  Service: Cardiovascular;  Laterality: N/A;  . MYOMECTOMY     Social History  Substance Use Topics  . Smoking status: Current Every Day Smoker    Packs/day: 0.50    Years: 37.00    Types: Cigarettes  . Smokeless tobacco: Never Used     Comment: taking chantix  . Alcohol use No   family history includes Cancer in her sister; Hypertension in her father and mother.  ROS as above:  Medications: Current Outpatient Prescriptions  Medication Sig Dispense Refill  . albuterol (PROVENTIL HFA;VENTOLIN HFA) 108 (  90 Base) MCG/ACT inhaler Inhale 2 puffs into the lungs every 6 (six) hours as needed for wheezing or shortness of breath.     Marland Kitchen aspirin 81 MG tablet Take 81 mg by mouth daily.    Marland Kitchen atorvastatin (LIPITOR) 40 MG tablet Take 1 tablet (40 mg total) by mouth daily. 90 tablet 1  . carvedilol (COREG) 25 MG tablet Take 1 tablet (25 mg total) by mouth 2 (two) times daily with a meal. 60 tablet 1  . fluconazole (DIFLUCAN) 150 MG tablet Take 1 tablet (150 mg total) by mouth once. 1 tablet 0  . furosemide (LASIX) 40 MG tablet Take 3 tablets (120 mg total)  by mouth 2 (two) times daily. (Patient taking differently: Take 120 mg by mouth daily. ) 180 tablet 2  . gabapentin (NEURONTIN) 300 MG capsule Take 300 mg by mouth at bedtime.  0  . hydrocortisone 1 % ointment Apply 1 application topically 2 (two) times daily as needed (eczema on face). 30 g 1  . lisinopril (PRINIVIL,ZESTRIL) 10 MG tablet Take 2 tablets (20 mg total) by mouth daily. 30 tablet 1  . meloxicam (MOBIC) 15 MG tablet Take 15 mg by mouth daily as needed for pain.   0  . metFORMIN (GLUCOPHAGE) 500 MG tablet Take 1 tablet (500 mg total) by mouth 2 (two) times daily with a meal. 60 tablet 3  . metroNIDAZOLE (METROGEL) 0.75 % vaginal gel Place 1 Applicatorful vaginally 2 (two) times daily. 70 g 0  . mometasone-formoterol (DULERA) 200-5 MCG/ACT AERO Inhale 2 puffs into the lungs 2 (two) times daily.    Marland Kitchen Spacer/Aero-Holding Chambers (E-Z SPACER) inhaler Use as instructed 1 each 2  . spironolactone (ALDACTONE) 25 MG tablet Take 1 tablet (25 mg total) by mouth daily. 30 tablet 0  . tiotropium (SPIRIVA) 18 MCG inhalation capsule Place 1 capsule (18 mcg total) into inhaler and inhale daily. 30 capsule 1  . tiZANidine (ZANAFLEX) 2 MG tablet Take 1 tablet by mouth at bedtime as needed for muscle spasms.   0  . triamcinolone cream (KENALOG) 0.1 % Apply 1 application topically 3 (three) times daily as needed (eczema on arms). 30 g 1  . trimethoprim-polymyxin b (POLYTRIM) ophthalmic solution Place 1 drop into both eyes every 4 (four) hours. 10 mL 0   No current facility-administered medications for this visit.    No Known Allergies   Exam:  BP (!) 162/102   Pulse 78   Wt 298 lb (135.2 kg)   LMP 08/31/2015   BMI 51.15 kg/m  Gen: Well NAD MSK:  Lumbar back nontender to palpation No bony midline tenderness No obvious knee deformity Bilateral knees with full range of motion No joint line tenderness Strength intact   No results found for this or any previous visit (from the past 24  hour(s)). No results found.    Assessment and Plan: 55 y.o. female with:  1.  Lumbar radiculopathy:  Improved after epidural steroid injection.  Instructed to call our office if her leg pain worsens and we can schedule another epidural steroid injection.  2.  Bilateral knee pain, likely secondary to osteoarthritis.  Her pain doesn't bother her enough at this time to warrant a steroid injection - Follow up if pain worsens for possible steroid injection  Elevated blood pressure. Follow-up with PCP  No orders of the defined types were placed in this encounter.   Discussed warning signs or symptoms. Please see discharge instructions. Patient expresses understanding.

## 2016-07-14 ENCOUNTER — Other Ambulatory Visit: Payer: Self-pay | Admitting: Family Medicine

## 2016-07-14 DIAGNOSIS — I10 Essential (primary) hypertension: Secondary | ICD-10-CM

## 2016-07-21 ENCOUNTER — Telehealth: Payer: Self-pay

## 2016-07-21 DIAGNOSIS — M5416 Radiculopathy, lumbar region: Secondary | ICD-10-CM

## 2016-07-21 NOTE — Telephone Encounter (Signed)
Pt.notified

## 2016-07-21 NOTE — Telephone Encounter (Signed)
Epidural steroid injection ordered. Patient should be contacted soon.

## 2016-07-21 NOTE — Telephone Encounter (Signed)
Pt is requesting another epidural injection.  She stated that its been 2 months since her last injection. Please advise.

## 2016-07-25 ENCOUNTER — Ambulatory Visit
Admission: RE | Admit: 2016-07-25 | Discharge: 2016-07-25 | Disposition: A | Discharge status: Home / Self Care | Payer: Medicare Other | Source: Ambulatory Visit | Attending: Family Medicine | Admitting: Family Medicine

## 2016-07-25 DIAGNOSIS — M5126 Other intervertebral disc displacement, lumbar region: Secondary | ICD-10-CM | POA: Diagnosis not present

## 2016-07-25 MED ORDER — IOPAMIDOL (ISOVUE-M 200) INJECTION 41%
1.0000 mL | Freq: Once | INTRAMUSCULAR | Status: AC
  Administered 2016-07-25: 1 mL via EPIDURAL

## 2016-07-25 MED ORDER — METHYLPREDNISOLONE ACETATE 40 MG/ML INJ SUSP (RADIOLOG
120.0000 mg | Freq: Once | INTRAMUSCULAR | Status: AC
  Administered 2016-07-25: 120 mg via EPIDURAL

## 2016-07-25 NOTE — Discharge Instructions (Signed)

## 2016-07-28 ENCOUNTER — Other Ambulatory Visit: Payer: Self-pay | Admitting: Family Medicine

## 2016-07-29 NOTE — Telephone Encounter (Signed)
Rx filled.  [name]. [name], [place] Resident PGY-3 07/29/2016 12:11 PM

## 2016-07-30 ENCOUNTER — Ambulatory Visit (INDEPENDENT_AMBULATORY_CARE_PROVIDER_SITE_OTHER): Payer: Medicare Other | Admitting: Family Medicine

## 2016-07-30 ENCOUNTER — Other Ambulatory Visit (HOSPITAL_COMMUNITY)
Admission: RE | Admit: 2016-07-30 | Discharge: 2016-07-30 | Disposition: A | Discharge status: Home / Self Care | Payer: Medicare Other | Source: Ambulatory Visit | Attending: Family Medicine | Admitting: Family Medicine

## 2016-07-30 ENCOUNTER — Encounter: Payer: Self-pay | Admitting: Family Medicine

## 2016-07-30 VITALS — BP 162/90 | HR 79 | Temp 98.2°F | Ht 64.0 in | Wt 302.0 lb

## 2016-07-30 DIAGNOSIS — Z114 Encounter for screening for human immunodeficiency virus [HIV]: Secondary | ICD-10-CM | POA: Diagnosis not present

## 2016-07-30 DIAGNOSIS — Z113 Encounter for screening for infections with a predominantly sexual mode of transmission: Secondary | ICD-10-CM | POA: Diagnosis not present

## 2016-07-30 DIAGNOSIS — Z7251 High risk heterosexual behavior: Secondary | ICD-10-CM | POA: Diagnosis not present

## 2016-07-30 DIAGNOSIS — E119 Type 2 diabetes mellitus without complications: Secondary | ICD-10-CM

## 2016-07-30 DIAGNOSIS — L298 Other pruritus: Secondary | ICD-10-CM | POA: Diagnosis not present

## 2016-07-30 DIAGNOSIS — N898 Other specified noninflammatory disorders of vagina: Secondary | ICD-10-CM

## 2016-07-30 LAB — POCT WET PREP (WET MOUNT)
CLUE CELLS WET PREP WHIFF POC: NEGATIVE
Trichomonas Wet Prep HPF POC: ABSENT

## 2016-07-30 LAB — POCT GLYCOSYLATED HEMOGLOBIN (HGB A1C): Hemoglobin A1C: 7.3

## 2016-07-30 MED ORDER — FLUCONAZOLE 150 MG PO TABS: 150.0000 mg | ORAL_TABLET | Freq: Once | ORAL | 0 refills | Status: AC

## 2016-07-30 NOTE — Patient Instructions (Signed)
Thank you so much for coming to visit today! We will check several labs today and contact you with the results. Please use protection with intercourse to prevent STDs and pregnancy.  Dr. Gerlean Ren

## 2016-07-30 NOTE — Telephone Encounter (Signed)
Patient is coming in today to see dr. Gerlean Ren for a same day appointment and I asked her assistant to please schedule an appointment for her to come in and see pcp for Dm and HTN. Jazmin Hartsell,CMA

## 2016-07-31 LAB — RPR

## 2016-07-31 LAB — GC/CHLAMYDIA PROBE AMP (~~LOC~~) NOT AT ARMC
Chlamydia: NEGATIVE
NEISSERIA GONORRHEA: NEGATIVE

## 2016-07-31 LAB — HIV ANTIBODY (ROUTINE TESTING W REFLEX): HIV: NONREACTIVE

## 2016-07-31 NOTE — Progress Notes (Signed)
Subjective:     Patient ID: Hailey Barnes, female   DOB: [birth year], 55 y.o.   MRN: [id number]  HPI Hailey Barnes is a 55yo female presenting today for concerns of STD exposure. - History of vaginal intercourse with female approximately one month ago. Did not use protection. Female called her last night and reported he was experiencing some genital "tingling" - Notes some vaginal itching - Denies vaginal discharge, bleeding, genital sores, fever, abdominal pain, dysuria, urinary frequency - Has not had period for 6 months - Smoker  Review of Systems Per HPI    Objective:   Physical Exam  Constitutional: She appears well-developed and well-nourished. No distress.  HENT:  Head: Normocephalic and atraumatic.  Cardiovascular: Normal rate and regular rhythm.   No murmur heard. Pulmonary/Chest: Effort normal. No respiratory distress. She has no wheezes.  Abdominal: She exhibits no distension. There is no tenderness.  Genitourinary:  Genitourinary Comments: - No vaginal discharge noted. No genital ulcers. No cervical motion tenderness noted. No adnexal tenderness. No vaginal wall tenderness noted.   Skin: No rash noted.  Psychiatric: She has a normal mood and affect. Her behavior is normal.      Assessment and Plan:     1. High Risk Sexual Behavior - Wet prep with yeast. Diflucan sent to pharmacy. - Will check GC/Chlamydia, HIV, RPR - Refuses pregnancy test. Discussed that she still has risk of getting pregnant in intermenopausal period. - Counseled on safe sex practices - Follow up with PCP

## 2016-08-04 ENCOUNTER — Telehealth: Payer: Self-pay | Admitting: Family Medicine

## 2016-08-04 NOTE — Telephone Encounter (Signed)
Pt is calling and would like to know what her lab results are. Please call with the details. jw

## 2016-08-05 NOTE — Telephone Encounter (Signed)
Results given.

## 2016-08-05 NOTE — Telephone Encounter (Signed)
Pt saw Dr Gerlean Ren. Will forward to her.   Hailey Barnes. [name], [facility] Resident PGY-3 08/05/2016 12:23 PM

## 2016-08-07 ENCOUNTER — Encounter: Payer: Self-pay | Admitting: Family Medicine

## 2016-08-07 ENCOUNTER — Ambulatory Visit (HOSPITAL_COMMUNITY)
Admission: RE | Admit: 2016-08-07 | Discharge: 2016-08-07 | Disposition: A | Discharge status: Home / Self Care | Payer: Medicare Other | Source: Ambulatory Visit | Attending: Family Medicine | Admitting: Family Medicine

## 2016-08-07 ENCOUNTER — Ambulatory Visit (INDEPENDENT_AMBULATORY_CARE_PROVIDER_SITE_OTHER): Payer: Medicare Other | Admitting: Family Medicine

## 2016-08-07 ENCOUNTER — Other Ambulatory Visit: Payer: Self-pay

## 2016-08-07 VITALS — BP 174/102 | HR 84 | Temp 97.5°F | Wt 305.0 lb

## 2016-08-07 DIAGNOSIS — R079 Chest pain, unspecified: Secondary | ICD-10-CM | POA: Insufficient documentation

## 2016-08-07 DIAGNOSIS — I10 Essential (primary) hypertension: Secondary | ICD-10-CM

## 2016-08-07 DIAGNOSIS — E785 Hyperlipidemia, unspecified: Secondary | ICD-10-CM

## 2016-08-07 DIAGNOSIS — IMO0001 Reserved for inherently not codable concepts without codable children: Secondary | ICD-10-CM

## 2016-08-07 DIAGNOSIS — Z6841 Body Mass Index (BMI) 40.0 and over, adult: Secondary | ICD-10-CM

## 2016-08-07 DIAGNOSIS — E119 Type 2 diabetes mellitus without complications: Secondary | ICD-10-CM | POA: Diagnosis not present

## 2016-08-07 DIAGNOSIS — E669 Obesity, unspecified: Secondary | ICD-10-CM

## 2016-08-07 DIAGNOSIS — Z23 Encounter for immunization: Secondary | ICD-10-CM | POA: Diagnosis not present

## 2016-08-07 MED ORDER — METFORMIN HCL 1000 MG PO TABS: 1000.0000 mg | ORAL_TABLET | Freq: Two times a day (BID) | ORAL | 3 refills | Status: DC

## 2016-08-07 NOTE — Assessment & Plan Note (Signed)
Continue atorvastatin. Last LDL 69.

## 2016-08-07 NOTE — Assessment & Plan Note (Signed)
Discussed lifestle changes. Patient interested in bariatric surgery. Will refer today. BMI 52.35.

## 2016-08-07 NOTE — Patient Instructions (Signed)
Increase your metformin to 1056m twice daily.  We will send you to see a surgeon to discuss bariatric surgery.  Please come back in 1-2 weeks, or sooner if you need anything else.  Take care,  Dr PJerline Pain

## 2016-08-07 NOTE — Assessment & Plan Note (Signed)
Significantly elevated today, thought patient has not taken her BP meds. EKG today negative. Patient said that she was going to go straight home and take her BP meds. Instructed patient to recheck BP at home after taking meds and call us if still above 150/90. Will follow up in 1 week.

## 2016-08-07 NOTE — Assessment & Plan Note (Signed)
Last A1c increased to 7.2. Will increase metformin to 1068m bid. Follow up in 3 months.

## 2016-08-07 NOTE — Progress Notes (Signed)
   Subjective:  Hailey Barnes is a 55 y.o. female who presents to the [facility] today with a chief complaint of T2DM follow up.   HPI:  T2DM Tolerating metformin without side effects. No polyuria or polydipsia. No hypoglycemia symptoms. Patient is concerned about her weight. Admits to dietary indiscretions and that she has not been "taking care of herself," like she had in the past.   Obesity Patient interested in weight loss options. Wants to be referred for bariatric surgery.   Hypertension BP Readings from Last 3 Encounters:  08/07/16 (!) 174/102  07/30/16 (!) 162/90  07/25/16 (!) 196/105   Home BP monitoring-No Compliant with medications-No, has not taken medications today ROS-Denies any HA, SOB, blurry vision, transient weakness, orthopnea, PND.   Chest Pain Patient also reports persistent, sharp, left sided chest pain over the past couple of weeks. Pain is nonexertional. Not improved with rest. No associated shortness of breath. No nausea or vomiting. Pain worse with movement of arms.   HLD Tolerating atorvastatin without side effects.   ROS: Per HPI  PMH: Smoking history reviewed.    Objective:  Physical Exam: BP (!) 174/102   Pulse 84   Temp 97.5 F (36.4 C) (Oral)   Wt (!) 305 lb (138.3 kg)   LMP 08/31/2015   SpO2 93%   BMI 52.35 kg/m   Gen: NAD, resting comfortably CV: RRR with no murmurs appreciated. Chest pain reproducible with palpation of anterior chest wall.  Pulm: NWOB, CTAB with no crackles, wheezes, or rhonchi GI: Obese, Normal bowel sounds present. Soft, Nontender, Nondistended. MSK: no edema, cyanosis, or clubbing noted Skin: warm, dry Neuro: grossly normal, moves all extremities Psych: Normal affect and thought content  EKG: NSR, no ischemic changes.   Assessment/Plan:  Hypertension Significantly elevated today, thought patient has not taken her BP meds. EKG today negative. Patient said that she was going to go straight home and take her BP  meds. Instructed patient to recheck BP at home after taking meds and call us if still above 150/90. Will follow up in 1 week.   T2DM (type 2 diabetes mellitus) (HCC) Last A1c increased to 7.2. Will increase metformin to 1065m bid. Follow up in 3 months.   Hyperlipidemia Continue atorvastatin. Last LDL 69.   Obesity Discussed lifestle changes. Patient interested in bariatric surgery. Will refer today. BMI 52.35.   Chest Pain Doubt cardiac etiology given atypical symptoms and 2 week course. EKG today negative. Likely MSK pain given reproducibility on exam. Will manage conservatively. Strict return precautions reviewed.   CAlgis Greenhouse PJerline Pain MUtopiaResident PGY-3 08/07/2016 12:13 PM

## 2016-08-13 ENCOUNTER — Other Ambulatory Visit: Payer: Self-pay | Admitting: Family Medicine

## 2016-08-13 DIAGNOSIS — I10 Essential (primary) hypertension: Secondary | ICD-10-CM

## 2016-08-19 ENCOUNTER — Ambulatory Visit (INDEPENDENT_AMBULATORY_CARE_PROVIDER_SITE_OTHER): Payer: Medicare Other | Admitting: Family Medicine

## 2016-08-19 ENCOUNTER — Encounter: Payer: Self-pay | Admitting: Family Medicine

## 2016-08-19 VITALS — BP 138/93 | HR 88 | Temp 97.4°F | Wt 299.0 lb

## 2016-08-19 DIAGNOSIS — I10 Essential (primary) hypertension: Secondary | ICD-10-CM

## 2016-08-19 DIAGNOSIS — B9789 Other viral agents as the cause of diseases classified elsewhere: Secondary | ICD-10-CM | POA: Diagnosis not present

## 2016-08-19 DIAGNOSIS — J069 Acute upper respiratory infection, unspecified: Secondary | ICD-10-CM

## 2016-08-19 MED ORDER — SPIRONOLACTONE 25 MG PO TABS: 25.0000 mg | ORAL_TABLET | Freq: Every day | ORAL | 3 refills | Status: DC

## 2016-08-19 MED ORDER — IPRATROPIUM BROMIDE 0.06 % NA SOLN: 2.0000 | Freq: Four times a day (QID) | NASAL | 0 refills | Status: DC

## 2016-08-19 NOTE — Assessment & Plan Note (Addendum)
Continues to be above goal. Patient has not been taking spironolactone. Will restart today. Check BMP today and again in 5 days. Follow up next week.

## 2016-08-19 NOTE — Patient Instructions (Signed)
Please restart the spironolactone. Come back to recheck blood work next week.  I think you have a viral respiratory infection. Use the nasal spray to help with the symptoms. You should be feeling better within a week.  Please call Dr Jenne Campus to schedule a nutritionist appointment.  Take care,  Dr [name]

## 2016-08-19 NOTE — Progress Notes (Signed)
    Subjective:  Hailey Barnes is a 55 y.o. female who presents to the [facility] today with a chief complaint of congestion.   HPI:  Hypertension BP Readings from Last 3 Encounters:  08/19/16 (!) 138/93  08/07/16 (!) 174/102  07/30/16 (!) 162/90   Home BP monitoring-yes Compliant with medications-Has not been taking spironolactone, otherwise is compliant without side effects ROS-Denies any CP, HA, SOB, blurry vision, LE edema, transient weakness, orthopnea, PND.   Nasal Congestion Symptoms started a week ago. Associated symptoms include cough, sneeze, rhinorrhea. No fevers, ear pain, or sore throat. Cough is productive of a thick yellow sputum. Tried nyquil which didn't help. Symptoms worse at night. No sick contacts.   ROS: Per HPI  PMH: Smoking history reviewed.    Objective:  Physical Exam: BP (!) 138/93   Pulse 88   Temp 97.4 F (36.3 C) (Oral)   Wt 299 lb (135.6 kg)   LMP 08/31/2015   SpO2 98%   BMI 51.32 kg/m   Gen: NAD, resting comfortably HEENT: OP clear. TMs clear bilaterally. Nasal mucosa erythematous and boggy bilaterally with clear nasal discharge.  CV: RRR with no murmurs appreciated Pulm: NWOB, CTAB with no crackles, wheezes, or rhonchi GI: Obese, Normal bowel sounds present. Soft, Nontender, Nondistended. MSK: no edema, cyanosis, or clubbing noted Skin: warm, dry Neuro: grossly normal, moves all extremities Psych: Normal affect and thought content  Assessment/Plan:  Hypertension Continues to be above goal. Patient has not been taking spironolactone. Will restart today. Check BMP today and again in 5 days. Follow up next week.   Viral URI with cough Symptoms consistent with viral URI. No signs of bacterial infection. Will treat with atrovent nasal spray. Return precautions reviewed. Follow up as needed.   [name]. [name], [facility] Resident PGY-3 08/19/2016 3:09 PM

## 2016-08-20 ENCOUNTER — Encounter: Payer: Self-pay | Admitting: Family Medicine

## 2016-08-20 LAB — BASIC METABOLIC PANEL
BUN: 14 mg/dL (ref 7–25)
CHLORIDE: 102 mmol/L (ref 98–110)
CO2: 29 mmol/L (ref 20–31)
CREATININE: 0.98 mg/dL (ref 0.50–1.05)
Calcium: 9.2 mg/dL (ref 8.6–10.4)
Glucose, Bld: 94 mg/dL (ref 65–99)
Potassium: 3.6 mmol/L (ref 3.5–5.3)
Sodium: 141 mmol/L (ref 135–146)

## 2016-08-26 ENCOUNTER — Ambulatory Visit: Payer: Medicare Other | Admitting: Family Medicine

## 2016-09-08 ENCOUNTER — Ambulatory Visit: Payer: Medicare Other | Admitting: Family Medicine

## 2016-09-09 ENCOUNTER — Ambulatory Visit (INDEPENDENT_AMBULATORY_CARE_PROVIDER_SITE_OTHER): Payer: Commercial Managed Care - HMO

## 2016-09-09 ENCOUNTER — Ambulatory Visit (INDEPENDENT_AMBULATORY_CARE_PROVIDER_SITE_OTHER): Payer: Commercial Managed Care - HMO | Admitting: Family Medicine

## 2016-09-09 ENCOUNTER — Encounter: Payer: Self-pay | Admitting: Family Medicine

## 2016-09-09 VITALS — BP 144/103 | HR 100 | Wt 300.0 lb

## 2016-09-09 DIAGNOSIS — M170 Bilateral primary osteoarthritis of knee: Secondary | ICD-10-CM | POA: Diagnosis not present

## 2016-09-09 DIAGNOSIS — G8929 Other chronic pain: Secondary | ICD-10-CM | POA: Diagnosis not present

## 2016-09-09 DIAGNOSIS — M25561 Pain in right knee: Secondary | ICD-10-CM | POA: Insufficient documentation

## 2016-09-09 DIAGNOSIS — M25562 Pain in left knee: Secondary | ICD-10-CM

## 2016-09-09 DIAGNOSIS — M5416 Radiculopathy, lumbar region: Secondary | ICD-10-CM

## 2016-09-09 DIAGNOSIS — M179 Osteoarthritis of knee, unspecified: Secondary | ICD-10-CM | POA: Diagnosis not present

## 2016-09-09 NOTE — Patient Instructions (Signed)
Thank you for coming in today. Use tylenol arthritis.  Return as needed.  You should hear about the epidural steroid injection.  Get xray today.  Call or go to the ER if you develop a large red swollen joint with extreme pain or oozing puss.

## 2016-09-09 NOTE — Progress Notes (Signed)
Hailey Barnes is a 55 y.o. female who presents to [facility] today for knee pain bilaterally and back pain.  Patient has bilateral knee pain. This is been ongoing for several years. 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 She's interested in injections today for possible. Pain is worse with activity and better with rest. She has tried some over-the-counter medications.  Back Pain: Patient Has Back Pain Radiating to the Right Leg. She's Been Diagnosed with Radiculopathy and Done Well in the past with Epidural Steroid Injections. She Would like a Repeat Injection in the near future. No weakness or numbness bowel bladder dysfunction.   Past Medical History:  Diagnosis Date  . Arthritis    "knees" (02/21/2014), back  . CHF (congestive heart failure) ([place])   . Chronic bronchitis ([place])    "get it q yr" (02/21/2014)  . Chronic lower back pain   . Complication of anesthesia    "I don't come out well; I chew on my tongue"  . COPD (chronic obstructive pulmonary disease) ([place])   . Diabetes mellitus without complication ([place])   . Fibroid   . GERD (gastroesophageal reflux disease)   . Heart murmur   . Hypertension   . Infection of right eye    current dx on Saturday 7/15 - using drops  . Migraine 1982-2009  . MVP (mitral valve prolapse)   . Neuromuscular disorder ([place])    right leg nerve pain  . Shortness of breath   . Sleep apnea 02/2016   recent hospitalization noted apnea during stay no sleep studt performed since this episode  . Stroke [facility]) noted on CAT 02/2014   "light", LLE weakness remains (03/30/2014)  . 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 Tingling in extremities 12/12/2010   Uric acid and electrolytes (02/23) normal but WBC elevated.   D/Dx: carpal tunnel, ulnar neuropathy Less likely: cervical radiculopathy, vasculitis    Past Surgical History:  Procedure Laterality Date  .  [place]  . DILATATION & CURETTAGE/HYSTEROSCOPY WITH MYOSURE N/A 05/06/2016   Procedure: DILATATION & CURETTAGE/HYSTEROSCOPY WITH MYOSURE;  Surgeon: Terrance Mass, MD;  Location: [place] [facility];  Service: Gynecology;  Laterality: N/A;  request to follow around 8:45  requests one hour OR time  . DILATION AND CURETTAGE OF UTERUS    . FOOT SURGERY Bilateral    "took bones out; put pins in"  . LEFT AND RIGHT HEART CATHETERIZATION WITH CORONARY ANGIOGRAM N/A 03/31/2014   Procedure: LEFT AND RIGHT HEART CATHETERIZATION WITH CORONARY ANGIOGRAM;  Surgeon: Birdie Riddle, MD;  Location: [place] CATH [facility];  Service: Cardiovascular;  Laterality: N/A;  . MYOMECTOMY     Social History  Substance Use Topics  . Smoking status: Current Every Day Smoker    Packs/day: 0.50    Years: 37.00    Types: Cigarettes  . Smokeless tobacco: Never Used     Comment: taking chantix  . Alcohol use No     ROS:  As above   Medications: Current Outpatient Prescriptions  Medication Sig Dispense Refill  . albuterol (PROVENTIL HFA;VENTOLIN HFA) 108 (90 Base) MCG/ACT inhaler Inhale 2 puffs into the lungs every 6 (six) hours as needed for wheezing or shortness of breath.     Marland Kitchen aspirin 81 MG tablet Take 81 mg by mouth daily.    Marland Kitchen atorvastatin (LIPITOR) 40 MG tablet Take 1 tablet (40 mg total) by mouth  daily. 90 tablet 1  . carvedilol (COREG) 25 MG tablet TAKE ONE TABLET BY MOUTH TWICE DAILY WITH A MEAL 60 tablet 5  . furosemide (LASIX) 40 MG tablet Take 3 tablets (120 mg total) by mouth 2 (two) times daily. (Patient taking differently: Take 120 mg by mouth daily. ) 180 tablet 2  . gabapentin (NEURONTIN) 300 MG capsule Take 300 mg by mouth at bedtime.  0  . hydrocortisone 1 % ointment Apply 1 application topically 2 (two) times daily as needed (eczema on face). 30 g 1  . lisinopril (PRINIVIL,ZESTRIL) 10 MG tablet TAKE TWO TABLETS BY MOUTH ONCE DAILY 90 tablet 3  . meloxicam (MOBIC) 15 MG tablet Take 15 mg by mouth  daily as needed for pain.   0  . metFORMIN (GLUCOPHAGE) 1000 MG tablet Take 1 tablet (1,000 mg total) by mouth 2 (two) times daily with a meal. 180 tablet 3  . metroNIDAZOLE (METROGEL) 0.75 % vaginal gel Place 1 Applicatorful vaginally 2 (two) times daily. 70 g 0  . mometasone-formoterol (DULERA) 200-5 MCG/ACT AERO Inhale 2 puffs into the lungs 2 (two) times daily.    Marland Kitchen Spacer/Aero-Holding Chambers (E-Z SPACER) inhaler Use as instructed 1 each 2  . spironolactone (ALDACTONE) 25 MG tablet Take 1 tablet (25 mg total) by mouth daily. 90 tablet 3  . tiotropium (SPIRIVA) 18 MCG inhalation capsule Place 1 capsule (18 mcg total) into inhaler and inhale daily. 30 capsule 1  . tiZANidine (ZANAFLEX) 2 MG tablet Take 1 tablet by mouth at bedtime as needed for muscle spasms.   0  . triamcinolone cream (KENALOG) 0.1 % Apply 1 application topically 3 (three) times daily as needed (eczema on arms). 30 g 1  . trimethoprim-polymyxin b (POLYTRIM) ophthalmic solution Place 1 drop into both eyes every 4 (four) hours. 10 mL 0  . ipratropium (ATROVENT) 0.06 % nasal spray Place 2 sprays into both nostrils 4 (four) times daily. 15 mL 0   No current facility-administered medications for this visit.    No Known Allergies   Exam:  BP (!) 144/103   Pulse 100   Wt 300 lb (136.1 kg)   LMP 08/31/2015   BMI 51.49 kg/m  General: Well Developed, well nourished, and in no acute distress. Obese Neuro/Psych: Alert and oriented x3, extra-ocular muscles intact, able to move all 4 extremities, sensation grossly intact. Skin: Warm and dry, no rashes noted.  Respiratory: Not using accessory muscles, speaking in full sentences, trachea midline.  Cardiovascular: Pulses palpable, no extremity edema. Abdomen: Does not appear distended. MSK: Knees bilaterally normal-appearing without effusion however exam is limited by body habitus. Diffusely tender. Normal motion. Stable ligamentous exam.   Procedure: Real-time Ultrasound  Guided Injection of right knee  Device: GE Logiq E  Images permanently stored and available for review in the ultrasound unit. Verbal informed consent obtained. Discussed risks and benefits of procedure. Warned about infection bleeding damage to structures skin hypopigmentation and fat atrophy among others. Patient expresses understanding and agreement Time-out conducted.  Noted no overlying erythema, induration, or other signs of local infection.  Skin prepped in a sterile fashion.  Local anesthesia: Topical Ethyl chloride.  With sterile technique and under real time ultrasound guidance: 80 mg kenalog and 63m marcaine injected easily.  Completed without difficulty  Pain immediately resolved suggesting accurate placement of the medication.  Advised to call if fevers/chills, erythema, induration, drainage, or persistent bleeding.  Images permanently stored and available for review in the ultrasound unit.  Impression: Technically  successful ultrasound guided injection.   Procedure: Real-time Ultrasound Guided Injection of left knee  Device: GE Logiq E  Images permanently stored and available for review in the ultrasound unit. Verbal informed consent obtained. Discussed risks and benefits of procedure. Warned about infection bleeding damage to structures skin hypopigmentation and fat atrophy among others. Patient expresses understanding and agreement Time-out conducted.  Noted no overlying erythema, induration, or other signs of local infection.  Skin prepped in a sterile fashion.  Local anesthesia: Topical Ethyl chloride.  With sterile technique and under real time ultrasound guidance: 80 mg kenalog and 71m marcaine injected easily.  Completed without difficulty  Pain immediately resolved suggesting accurate placement of the medication.  Advised to call if fevers/chills, erythema, induration, drainage, or persistent bleeding.  Images permanently stored and available  for review in the ultrasound unit.  Impression: Technically successful ultrasound guided injection.     X-ray knees bilaterally pending   Assessment and Plan: 55y.o. female with  Knee pain bilaterally very likely due to DJD. Status post injection today. Return as needed. Recommend over-the-counter Tylenol arthritis as needed for pain. Avoid chronic NSAIDs if possible.  Back pain: Repeat epidural steroid injection.   Orders Placed This Encounter  Procedures  . DG Knee Complete 4 Views Left    Please include patellar sunrise, lateral, and weightbearing bilateral AP and bilateral rosenberg views    Standing Status:   Future    Number of Occurrences:   1    Standing Expiration Date:   11/09/2017    Order Specific Question:   Reason for exam:    Answer:   Please include patellar sunrise, lateral, and weightbearing bilateral AP and bilateral rosenberg views    Comments:   Please include patellar sunrise, lateral, and weightbearing bilateral AP and bilateral rosenberg views    Order Specific Question:   Preferred imaging location?    Answer:   [facility] DG Knee Complete 4 Views Right    Please include patellar sunrise, lateral, and weightbearing bilateral AP and bilateral rosenberg views    Standing Status:   Future    Number of Occurrences:   1    Standing Expiration Date:   11/09/2017    Order Specific Question:   Reason for exam:    Answer:   Please include patellar sunrise, lateral, and weightbearing bilateral AP and bilateral rosenberg views    Comments:   Please include patellar sunrise, lateral, and weightbearing bilateral AP and bilateral rosenberg views    Order Specific Question:   Preferred imaging location?    Answer:   [facility] DG Epidurography    Order Specific Question:   Reason for Exam (SYMPTOM  OR DIAGNOSIS REQUIRED)    Answer:   rt L4-5    Order Specific Question:   Is the patient pregnant?    Answer:   No    Order Specific  Question:   Preferred imaging location?    Answer:   [id number]    Discussed warning signs or symptoms. Please see discharge instructions. Patient expresses understanding.

## 2016-09-12 ENCOUNTER — Encounter (HOSPITAL_COMMUNITY): Payer: Self-pay

## 2016-09-12 ENCOUNTER — Inpatient Hospital Stay (HOSPITAL_COMMUNITY)
Admission: AD | Admit: 2016-09-12 | Discharge: 2016-09-12 | Disposition: A | Discharge status: Home / Self Care | Payer: Commercial Managed Care - HMO | Source: Ambulatory Visit | Attending: Obstetrics and Gynecology | Admitting: Obstetrics and Gynecology

## 2016-09-12 DIAGNOSIS — Z809 Family history of malignant neoplasm, unspecified: Secondary | ICD-10-CM | POA: Diagnosis not present

## 2016-09-12 DIAGNOSIS — I110 Hypertensive heart disease with heart failure: Secondary | ICD-10-CM | POA: Diagnosis not present

## 2016-09-12 DIAGNOSIS — K219 Gastro-esophageal reflux disease without esophagitis: Secondary | ICD-10-CM | POA: Insufficient documentation

## 2016-09-12 DIAGNOSIS — I341 Nonrheumatic mitral (valve) prolapse: Secondary | ICD-10-CM | POA: Insufficient documentation

## 2016-09-12 DIAGNOSIS — Z8249 Family history of ischemic heart disease and other diseases of the circulatory system: Secondary | ICD-10-CM | POA: Diagnosis not present

## 2016-09-12 DIAGNOSIS — E119 Type 2 diabetes mellitus without complications: Secondary | ICD-10-CM | POA: Diagnosis not present

## 2016-09-12 DIAGNOSIS — R42 Dizziness and giddiness: Secondary | ICD-10-CM | POA: Diagnosis not present

## 2016-09-12 DIAGNOSIS — I509 Heart failure, unspecified: Secondary | ICD-10-CM | POA: Diagnosis not present

## 2016-09-12 DIAGNOSIS — R112 Nausea with vomiting, unspecified: Secondary | ICD-10-CM | POA: Diagnosis not present

## 2016-09-12 DIAGNOSIS — Z9889 Other specified postprocedural states: Secondary | ICD-10-CM | POA: Insufficient documentation

## 2016-09-12 DIAGNOSIS — Z79899 Other long term (current) drug therapy: Secondary | ICD-10-CM | POA: Diagnosis not present

## 2016-09-12 DIAGNOSIS — F17210 Nicotine dependence, cigarettes, uncomplicated: Secondary | ICD-10-CM | POA: Diagnosis not present

## 2016-09-12 DIAGNOSIS — J449 Chronic obstructive pulmonary disease, unspecified: Secondary | ICD-10-CM | POA: Insufficient documentation

## 2016-09-12 DIAGNOSIS — N760 Acute vaginitis: Secondary | ICD-10-CM | POA: Diagnosis not present

## 2016-09-12 DIAGNOSIS — L292 Pruritus vulvae: Secondary | ICD-10-CM | POA: Diagnosis not present

## 2016-09-12 DIAGNOSIS — Z8673 Personal history of transient ischemic attack (TIA), and cerebral infarction without residual deficits: Secondary | ICD-10-CM | POA: Insufficient documentation

## 2016-09-12 DIAGNOSIS — Z7984 Long term (current) use of oral hypoglycemic drugs: Secondary | ICD-10-CM | POA: Insufficient documentation

## 2016-09-12 DIAGNOSIS — Z7982 Long term (current) use of aspirin: Secondary | ICD-10-CM | POA: Diagnosis not present

## 2016-09-12 DIAGNOSIS — G4730 Sleep apnea, unspecified: Secondary | ICD-10-CM | POA: Insufficient documentation

## 2016-09-12 LAB — URINALYSIS, ROUTINE W REFLEX MICROSCOPIC
Bilirubin Urine: NEGATIVE
GLUCOSE, UA: NEGATIVE mg/dL
Ketones, ur: NEGATIVE mg/dL
Nitrite: NEGATIVE
PH: 5.5 (ref 5.0–8.0)
Protein, ur: NEGATIVE mg/dL
Specific Gravity, Urine: >1.03 — ABNORMAL HIGH (ref 1.005–1.030)

## 2016-09-12 LAB — WET PREP, GENITAL
Clue Cells Wet Prep HPF POC: NONE SEEN
SPERM: NONE SEEN
TRICH WET PREP: NONE SEEN
YEAST WET PREP: NONE SEEN

## 2016-09-12 LAB — URINE MICROSCOPIC-ADD ON

## 2016-09-12 MED ORDER — ESTROGENS, CONJUGATED 0.625 MG/GM VA CREA: 1.0000 | TOPICAL_CREAM | VAGINAL | 0 refills | Status: DC

## 2016-09-12 NOTE — Discharge Instructions (Signed)

## 2016-09-12 NOTE — MAU Provider Note (Signed)
Chief Complaint:  Vaginal Itching   First Provider Initiated Contact with Patient 09/12/16 1816     HPI: Hailey Barnes is a 55 y.o. G1P1 who presents to maternity admissions reporting vaginal itching for two weeks. Comes and goes. No lesions..Does want cultures done. She reports vaginal bleeding, vaginal itching/burning, urinary symptoms, h/a, dizziness, n/v, or fever/chills.     Vaginal Discharge  The patient's primary symptoms include genital itching and vaginal discharge. The patient's pertinent negatives include no genital lesions, genital odor, missed menses, pelvic pain or vaginal bleeding. This is a recurrent problem. The current episode started 1 to 4 weeks ago. The problem has been waxing and waning. The patient is experiencing no pain. The problem affects both sides. She is not pregnant. Pertinent negatives include no abdominal pain, chills, constipation, diarrhea, dysuria, fever, nausea or vomiting. The vaginal discharge was white. There has been no bleeding. She has not been passing clots. She has not been passing tissue. Nothing aggravates the symptoms. She has tried nothing for the symptoms. She is sexually active.   RN Note: Pt C/O vaginal itching for the last 2 weeks, is intermittent.  Had intercourse, has been itching ever since.  Denies bleeding or discharge.  Past Medical History: Past Medical History:  Diagnosis Date  . Arthritis    "knees" (02/21/2014), back  . CHF (congestive heart failure) ([place])   . Chronic bronchitis ([place])    "get it q yr" (02/21/2014)  . Chronic lower back pain   . Complication of anesthesia    "I don't come out well; I chew on my tongue"  . COPD (chronic obstructive pulmonary disease) ([place])   . Diabetes mellitus without complication ([place])   . Fibroid   . GERD (gastroesophageal reflux disease)   . Heart murmur   . Hypertension   . Infection of right eye    current dx on Saturday 7/15 - using drops  . Migraine 1982-2009  . MVP (mitral valve  prolapse)   . Neuromuscular disorder ([place])    right leg nerve pain  . Shortness of breath   . Sleep apnea 02/2016   recent hospitalization noted apnea during stay no sleep studt performed since this episode  . Stroke [facility]) noted on CAT 02/2014   "light", LLE weakness remains (03/30/2014)  . 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 Tingling in extremities 12/12/2010   Uric acid and electrolytes (02/23) normal but WBC elevated.   D/Dx: carpal tunnel, ulnar neuropathy Less likely: cervical radiculopathy, vasculitis     Past obstetric history: OB History  Gravida Para Term Preterm AB Living  1 1       1   SAB TAB Ectopic Multiple Live Births               # Outcome Date GA Lbr Len/2nd Weight Sex Delivery Anes PTL Lv  1 Para               Past Surgical History: Past Surgical History:  Procedure Laterality Date  . [place]  . DILATATION & CURETTAGE/HYSTEROSCOPY WITH MYOSURE N/A 05/06/2016   Procedure: DILATATION & CURETTAGE/HYSTEROSCOPY WITH MYOSURE;  Surgeon: Terrance Mass, MD;  Location: [facility];  Service: Gynecology;  Laterality: N/A;  request to follow around 8:45  requests one hour OR time  . DILATION AND CURETTAGE OF UTERUS    . FOOT SURGERY Bilateral    "took bones out; put pins in"  .  LEFT AND RIGHT HEART CATHETERIZATION WITH CORONARY ANGIOGRAM N/A 03/31/2014   Procedure: LEFT AND RIGHT HEART CATHETERIZATION WITH CORONARY ANGIOGRAM;  Surgeon: Birdie Riddle, MD;  Location: [place] CATH LAB;  Service: Cardiovascular;  Laterality: N/A;  . MYOMECTOMY      Family History: Family History  Problem Relation Age of Onset  . Hypertension Mother   . Hypertension Father   . Cancer Sister     UTERINE???    Social History: Social History  Substance Use Topics  . Smoking status: Current Every Day Smoker    Packs/day: 0.50    Years: 37.00    Types: Cigarettes  . Smokeless tobacco: Never Used     Comment: taking  chantix  . Alcohol use No    Allergies: No Known Allergies  Meds:  Prescriptions Prior to Admission  Medication Sig Dispense Refill Last Dose  . albuterol (PROVENTIL HFA;VENTOLIN HFA) 108 (90 Base) MCG/ACT inhaler Inhale 2 puffs into the lungs every 6 (six) hours as needed for wheezing or shortness of breath.    Taking  . aspirin 81 MG tablet Take 81 mg by mouth daily.   Taking  . atorvastatin (LIPITOR) 40 MG tablet Take 1 tablet (40 mg total) by mouth daily. 90 tablet 1 Taking  . carvedilol (COREG) 25 MG tablet TAKE ONE TABLET BY MOUTH TWICE DAILY WITH A MEAL 60 tablet 5 Taking  . furosemide (LASIX) 40 MG tablet Take 3 tablets (120 mg total) by mouth 2 (two) times daily. (Patient taking differently: Take 120 mg by mouth daily. ) 180 tablet 2 Taking  . gabapentin (NEURONTIN) 300 MG capsule Take 300 mg by mouth at bedtime.  0 Taking  . hydrocortisone 1 % ointment Apply 1 application topically 2 (two) times daily as needed (eczema on face). 30 g 1 Taking  . ipratropium (ATROVENT) 0.06 % nasal spray Place 2 sprays into both nostrils 4 (four) times daily. 15 mL 0   . lisinopril (PRINIVIL,ZESTRIL) 10 MG tablet TAKE TWO TABLETS BY MOUTH ONCE DAILY 90 tablet 3 Taking  . meloxicam (MOBIC) 15 MG tablet Take 15 mg by mouth daily as needed for pain.   0 Taking  . metFORMIN (GLUCOPHAGE) 1000 MG tablet Take 1 tablet (1,000 mg total) by mouth 2 (two) times daily with a meal. 180 tablet 3 Taking  . metroNIDAZOLE (METROGEL) 0.75 % vaginal gel Place 1 Applicatorful vaginally 2 (two) times daily. 70 g 0 Taking  . mometasone-formoterol (DULERA) 200-5 MCG/ACT AERO Inhale 2 puffs into the lungs 2 (two) times daily.   Taking  . Spacer/Aero-Holding Chambers (E-Z SPACER) inhaler Use as instructed 1 each 2 Taking  . spironolactone (ALDACTONE) 25 MG tablet Take 1 tablet (25 mg total) by mouth daily. 90 tablet 3 Taking  . tiotropium (SPIRIVA) 18 MCG inhalation capsule Place 1 capsule (18 mcg total) into inhaler and  inhale daily. 30 capsule 1 Taking  . tiZANidine (ZANAFLEX) 2 MG tablet Take 1 tablet by mouth at bedtime as needed for muscle spasms.   0 Taking  . triamcinolone cream (KENALOG) 0.1 % Apply 1 application topically 3 (three) times daily as needed (eczema on arms). 30 g 1 Taking  . trimethoprim-polymyxin b (POLYTRIM) ophthalmic solution Place 1 drop into both eyes every 4 (four) hours. 10 mL 0 Taking    I have reviewed patient's Past Medical Hx, Surgical Hx, Family Hx, Social Hx, medications and allergies.  ROS:  Review of Systems  Constitutional: Negative for chills and fever.  Gastrointestinal: Negative for  abdominal pain, constipation, diarrhea, nausea and vomiting.  Genitourinary: Positive for vaginal discharge. Negative for dysuria, missed menses and pelvic pain.   Other systems negative     Physical Exam  Patient Vitals for the past 24 hrs:  BP Temp Pulse Resp  09/12/16 1621 (!) 175/107 97.6 F (36.4 C) 82 20   Constitutional: Well-developed, well-nourished female in no acute distress.  Cardiovascular: normal rate and rhythm Respiratory: normal effort, no distress. Lungs CTAB with no wheezes or crackles GI: Abd soft, non-tender.  Nondistended.  No rebound, No guarding.   MS: Extremities nontender, no edema, normal ROM Neurologic: Alert and oriented x 4.   Grossly nonfocal. GU: Neg CVAT. Skin:  Warm and Dry Psych:  Affect appropriate.  PELVIC EXAM: Cervix pink, visually closed, without lesion, scant white creamy discharge, vaginal walls and external genitalia normal   No atrophic changes noted Bimanual exam: Cervix firm, anterior, neg CMT, uterus nontender, nonenlarged, adnexa without tenderness, enlargement, or mass    Labs:     Ref. Range 09/12/2016 18:28  Yeast Wet Prep HPF POC Latest Ref Range: NONE SEEN  NONE SEEN  Trich, Wet Prep Latest Ref Range: NONE SEEN  NONE SEEN  Clue Cells Wet Prep HPF POC Latest Ref Range: NONE SEEN  NONE SEEN  WBC, Wet Prep HPF POC Latest  Ref Range: NONE SEEN  MODERATE (A)     Ref. Range 09/12/2016 17:58  Appearance Latest Ref Range: CLEAR  HAZY (A)  Bacteria, UA Latest Ref Range: NONE SEEN  MANY (A)  Bilirubin Urine Latest Ref Range: NEGATIVE  NEGATIVE  Color, Urine Latest Ref Range: YELLOW  YELLOW  Crystals Latest Ref Range: NEGATIVE  CA OXALATE CRYSTALS (A)  Glucose Latest Ref Range: NEGATIVE mg/dL NEGATIVE  Hgb urine dipstick Latest Ref Range: NEGATIVE  SMALL (A)  Ketones, ur Latest Ref Range: NEGATIVE mg/dL NEGATIVE  Leukocytes, UA Latest Ref Range: NEGATIVE  TRACE (A)  Nitrite Latest Ref Range: NEGATIVE  NEGATIVE  pH Latest Ref Range: 5.0 - 8.0  5.5  Protein Latest Ref Range: NEGATIVE mg/dL NEGATIVE  RBC / HPF Latest Ref Range: 0 - 5 RBC/hpf 0-5  Specific Gravity, Urine Latest Ref Range: 1.005 - 1.030  >1.030 (H)  Squamous Epithelial / LPF Latest Ref Range: NONE SEEN  6-30 (A)  Urine-Other Unknown MICROSCOPIC EXAM ...  WBC, UA Latest Ref Range: 0 - 5 WBC/hpf 6-30   Urine Culture: MULTIPLE SPECIES PRESENT, SUGGEST RECOLLECTION    Imaging:  None  MAU Course/MDM: I have ordered labs as follows: UA, urine culture, GC/Chlamydia, Wet Prep  All were negative Imaging ordered: none indicated Results reviewed.   All results negative.     Pt stable at time of discharge.  Assessment: Vaginitis, nonspecific Could be perimenopausal atrophy, though tissue does not look atrophic  Plan: Discharge home Recommend perineal hygeine, safe sex practices Has not seen Dr Toney Rakes in a while. Encouraged her to make an appointment with him soon for perimenopausal evaluation if symptoms persist   Encouraged to return here or to other Urgent Care/ED if she develops worsening of symptoms, increase in pain, fever, or other concerning symptoms.   Hansel Feinstein CNM, MSN Certified Nurse-Midwife 09/12/2016 6:17 PM

## 2016-09-12 NOTE — MAU Note (Signed)
Pt C/O vaginal itching for the last 2 weeks, is intermittent.  Had intercourse, has been itching ever since.  Denies bleeding or discharge.

## 2016-09-14 LAB — URINE CULTURE

## 2016-09-17 LAB — GC/CHLAMYDIA PROBE AMP (~~LOC~~) NOT AT ARMC
Chlamydia: NEGATIVE
NEISSERIA GONORRHEA: NEGATIVE

## 2016-09-18 ENCOUNTER — Telehealth: Payer: Self-pay

## 2016-09-18 ENCOUNTER — Ambulatory Visit (INDEPENDENT_AMBULATORY_CARE_PROVIDER_SITE_OTHER): Payer: Commercial Managed Care - HMO | Admitting: Family Medicine

## 2016-09-18 VITALS — BP 164/108 | HR 74 | Temp 97.8°F | Wt 300.4 lb

## 2016-09-18 DIAGNOSIS — N200 Calculus of kidney: Secondary | ICD-10-CM | POA: Insufficient documentation

## 2016-09-18 DIAGNOSIS — R300 Dysuria: Secondary | ICD-10-CM

## 2016-09-18 LAB — POCT URINALYSIS DIPSTICK
Bilirubin, UA: NEGATIVE
Glucose, UA: NEGATIVE
KETONES UA: NEGATIVE
LEUKOCYTES UA: NEGATIVE
Nitrite, UA: NEGATIVE
PH UA: 6.5
PROTEIN UA: NEGATIVE
Spec Grav, UA: 1.01
UROBILINOGEN UA: 0.2

## 2016-09-18 MED ORDER — TAMSULOSIN HCL 0.4 MG PO CAPS: 0.4000 mg | ORAL_CAPSULE | Freq: Every day | ORAL | 0 refills | Status: DC

## 2016-09-18 NOTE — Progress Notes (Signed)
   HPI  CC: Dysuria Patient is here with complaints of persistent dysuria. She states that she has been having intermittent severe right flank suprapubic and urethral pain. She says that this is been going on for the past 2 weeks. Pain can be completely absent throughout her day only to have severe waves of pain that last less than 30 minutes. She endorses dysuria but says that it doesn't feel burning in nature. She says that it feels sharp and is inconsistent on when this pain presents (not every void). She denies gross hematuria. No vaginal discharge. No vaginal bleeding. No recent trauma or injury. She denies any fevers or chills. No nausea, vomiting, or diarrhea. Pain has no association with bowel movements.  Review of Systems    See HPI for ROS. All other systems reviewed and are negative.  CC, SH/smoking status, and VS noted  Objective: BP (!) 164/108 (BP Location: Left Wrist, Patient Position: Sitting, Cuff Size: Normal)   Pulse 74   Temp 97.8 F (36.6 C) (Oral)   Wt (!) 300 lb 6.4 oz (136.3 kg)   LMP 08/31/2015   SpO2 97%   BMI 51.56 kg/m  Gen: NAD, alert, cooperative, and pleasant. HEENT: NCAT, EOMI, PERRL, MMM CV: RRR, no murmur Resp: CTAB, no wheezes, non-labored Abd: S, right-sided flank and CVA tenderness. Some suprapubic discomfort. ND, BS present, no guarding or organomegaly Ext: No edema, warm Neuro: Alert and oriented, Speech clear, No gross deficits  Assessment and plan:  Nephrolithiasis Patient is here with signs and symptoms most consistent with nephrolithiasis. No history of nephrolithiasis in the past. No known family history. Will obtain further imaging. - CT renal stone study. - Tamsulosin daily - Encouraged clear fluids and avoidance of calcium fortified foods and vitamin C. - Follow-up with PCP - Consider reducing or removing Lasix from medication regimen.   Orders Placed This Encounter  Procedures  . CT RENAL STONE STUDY    Standing Status:    Future    Standing Expiration Date:   12/17/2017    Order Specific Question:   Reason for Exam (SYMPTOM  OR DIAGNOSIS REQUIRED)    Answer:   Dysuria, hematuria    Order Specific Question:   Is patient pregnant?    Answer:   No    Order Specific Question:   Preferred imaging location?    Answer:   [facility]  . POCT urinalysis dipstick  . POCT urine pregnancy    Meds ordered this encounter  Medications  . tamsulosin (FLOMAX) 0.4 MG CAPS capsule    Sig: Take 1 capsule (0.4 mg total) by mouth daily.    Dispense:  30 capsule    Refill:  0     Elberta Leatherwood, MD,MS,  PGY3 09/18/2016 1:51 PM

## 2016-09-18 NOTE — Telephone Encounter (Signed)
Called patient to inform her of her appointment on 09/29/2016 at [facility] at 9 am. CT Renal Stone Study.  Patient was receptive to appointment and date although her exact date and time could not be accommodated.Ozella Almond

## 2016-09-18 NOTE — Patient Instructions (Signed)
It was a pleasure seeing you today in our clinic. Today we discussed your pain with urination. Here is the treatment plan we have discussed and agreed upon together:   - I prescribed due to muscle 07. Take 1 tablet daily. This should help some of the difficulty when passing stones. - I have placed an order for a CAT scan of your kidneys. My nursing staff will set this up before you leave here today. - I would like for you to use the strainer provided you today every time you urinate. This should help catch any kidney stone that is passed. - I would like to have you follow-up with your primary care provider 2-3 days after you undergo your CAT scan. Please schedule this before leaving today.

## 2016-09-18 NOTE — Assessment & Plan Note (Signed)
Patient is here with signs and symptoms most consistent with nephrolithiasis. No history of nephrolithiasis in the past. No known family history. Will obtain further imaging. - CT renal stone study. - Tamsulosin daily - Encouraged clear fluids and avoidance of calcium fortified foods and vitamin C. - Follow-up with PCP - Consider reducing or removing Lasix from medication regimen.

## 2016-09-18 NOTE — Telephone Encounter (Signed)
Thank you :)

## 2016-09-25 ENCOUNTER — Inpatient Hospital Stay
Admission: RE | Admit: 2016-09-25 | Discharge: 2016-09-25 | Disposition: A | Discharge status: Home / Self Care | Payer: Commercial Managed Care - HMO | Source: Ambulatory Visit | Attending: Family Medicine | Admitting: Family Medicine

## 2016-09-25 NOTE — Discharge Instructions (Signed)

## 2016-09-29 ENCOUNTER — Ambulatory Visit (HOSPITAL_COMMUNITY): Admission: RE | Admit: 2016-09-29 | Payer: Commercial Managed Care - HMO | Source: Ambulatory Visit

## 2016-10-01 ENCOUNTER — Ambulatory Visit
Admission: RE | Admit: 2016-10-01 | Discharge: 2016-10-01 | Disposition: A | Discharge status: Home / Self Care | Payer: Commercial Managed Care - HMO | Source: Ambulatory Visit | Attending: Family Medicine | Admitting: Family Medicine

## 2016-10-01 VITALS — BP 182/106 | HR 80

## 2016-10-01 DIAGNOSIS — M545 Low back pain: Secondary | ICD-10-CM | POA: Diagnosis not present

## 2016-10-01 DIAGNOSIS — M5416 Radiculopathy, lumbar region: Secondary | ICD-10-CM

## 2016-10-01 MED ORDER — METHYLPREDNISOLONE ACETATE 40 MG/ML INJ SUSP (RADIOLOG
120.0000 mg | Freq: Once | INTRAMUSCULAR | Status: AC
  Administered 2016-10-01: 120 mg via EPIDURAL

## 2016-10-01 MED ORDER — IOPAMIDOL (ISOVUE-M 200) INJECTION 41%
1.0000 mL | Freq: Once | INTRAMUSCULAR | Status: AC
  Administered 2016-10-01: 1 mL via EPIDURAL

## 2016-10-22 ENCOUNTER — Other Ambulatory Visit: Payer: Self-pay

## 2016-10-22 ENCOUNTER — Emergency Department (HOSPITAL_COMMUNITY): Payer: Medicare HMO

## 2016-10-22 ENCOUNTER — Emergency Department (HOSPITAL_BASED_OUTPATIENT_CLINIC_OR_DEPARTMENT_OTHER)
Admit: 2016-10-22 | Discharge: 2016-10-22 | Disposition: A | Discharge status: Home / Self Care | Payer: Medicare HMO | Attending: Emergency Medicine | Admitting: Emergency Medicine

## 2016-10-22 ENCOUNTER — Inpatient Hospital Stay (HOSPITAL_COMMUNITY)
Admission: EM | Admit: 2016-10-22 | Discharge: 2016-10-27 | DRG: 190 | Disposition: A | Discharge status: Home / Self Care | Payer: Medicare HMO | Attending: Family Medicine | Admitting: Family Medicine

## 2016-10-22 ENCOUNTER — Ambulatory Visit (HOSPITAL_COMMUNITY)
Admission: RE | Admit: 2016-10-22 | Discharge: 2016-10-22 | Disposition: A | Discharge status: Home / Self Care | Payer: Medicare HMO | Source: Ambulatory Visit | Attending: Family Medicine | Admitting: Family Medicine

## 2016-10-22 ENCOUNTER — Ambulatory Visit (INDEPENDENT_AMBULATORY_CARE_PROVIDER_SITE_OTHER): Payer: Commercial Managed Care - HMO | Admitting: Internal Medicine

## 2016-10-22 ENCOUNTER — Encounter: Payer: Self-pay | Admitting: Internal Medicine

## 2016-10-22 ENCOUNTER — Encounter (HOSPITAL_COMMUNITY): Payer: Self-pay | Admitting: Nurse Practitioner

## 2016-10-22 VITALS — BP 180/110 | HR 87 | Temp 98.0°F | Ht 64.0 in | Wt 319.0 lb

## 2016-10-22 DIAGNOSIS — R001 Bradycardia, unspecified: Secondary | ICD-10-CM | POA: Diagnosis present

## 2016-10-22 DIAGNOSIS — Z825 Family history of asthma and other chronic lower respiratory diseases: Secondary | ICD-10-CM | POA: Diagnosis not present

## 2016-10-22 DIAGNOSIS — E785 Hyperlipidemia, unspecified: Secondary | ICD-10-CM | POA: Diagnosis present

## 2016-10-22 DIAGNOSIS — Z7982 Long term (current) use of aspirin: Secondary | ICD-10-CM | POA: Diagnosis not present

## 2016-10-22 DIAGNOSIS — F17210 Nicotine dependence, cigarettes, uncomplicated: Secondary | ICD-10-CM | POA: Diagnosis present

## 2016-10-22 DIAGNOSIS — J441 Chronic obstructive pulmonary disease with (acute) exacerbation: Secondary | ICD-10-CM | POA: Diagnosis not present

## 2016-10-22 DIAGNOSIS — L83 Acanthosis nigricans: Secondary | ICD-10-CM | POA: Diagnosis present

## 2016-10-22 DIAGNOSIS — I110 Hypertensive heart disease with heart failure: Secondary | ICD-10-CM | POA: Diagnosis not present

## 2016-10-22 DIAGNOSIS — K219 Gastro-esophageal reflux disease without esophagitis: Secondary | ICD-10-CM | POA: Diagnosis present

## 2016-10-22 DIAGNOSIS — R600 Localized edema: Secondary | ICD-10-CM | POA: Diagnosis not present

## 2016-10-22 DIAGNOSIS — I455 Other specified heart block: Secondary | ICD-10-CM

## 2016-10-22 DIAGNOSIS — Z8673 Personal history of transient ischemic attack (TIA), and cerebral infarction without residual deficits: Secondary | ICD-10-CM

## 2016-10-22 DIAGNOSIS — E119 Type 2 diabetes mellitus without complications: Secondary | ICD-10-CM | POA: Diagnosis not present

## 2016-10-22 DIAGNOSIS — M7989 Other specified soft tissue disorders: Secondary | ICD-10-CM | POA: Diagnosis not present

## 2016-10-22 DIAGNOSIS — F329 Major depressive disorder, single episode, unspecified: Secondary | ICD-10-CM | POA: Diagnosis present

## 2016-10-22 DIAGNOSIS — R0789 Other chest pain: Secondary | ICD-10-CM | POA: Diagnosis not present

## 2016-10-22 DIAGNOSIS — I5033 Acute on chronic diastolic (congestive) heart failure: Secondary | ICD-10-CM | POA: Diagnosis not present

## 2016-10-22 DIAGNOSIS — E875 Hyperkalemia: Secondary | ICD-10-CM | POA: Diagnosis not present

## 2016-10-22 DIAGNOSIS — F410 Panic disorder [episodic paroxysmal anxiety] without agoraphobia: Secondary | ICD-10-CM | POA: Diagnosis present

## 2016-10-22 DIAGNOSIS — I10 Essential (primary) hypertension: Secondary | ICD-10-CM | POA: Diagnosis not present

## 2016-10-22 DIAGNOSIS — E662 Morbid (severe) obesity with alveolar hypoventilation: Secondary | ICD-10-CM | POA: Diagnosis present

## 2016-10-22 DIAGNOSIS — R0602 Shortness of breath: Secondary | ICD-10-CM

## 2016-10-22 DIAGNOSIS — Z79899 Other long term (current) drug therapy: Secondary | ICD-10-CM

## 2016-10-22 DIAGNOSIS — E1165 Type 2 diabetes mellitus with hyperglycemia: Secondary | ICD-10-CM

## 2016-10-22 DIAGNOSIS — Z7951 Long term (current) use of inhaled steroids: Secondary | ICD-10-CM

## 2016-10-22 DIAGNOSIS — M545 Low back pain: Secondary | ICD-10-CM | POA: Diagnosis present

## 2016-10-22 DIAGNOSIS — Z7984 Long term (current) use of oral hypoglycemic drugs: Secondary | ICD-10-CM | POA: Diagnosis not present

## 2016-10-22 DIAGNOSIS — R0600 Dyspnea, unspecified: Secondary | ICD-10-CM | POA: Diagnosis not present

## 2016-10-22 DIAGNOSIS — R269 Unspecified abnormalities of gait and mobility: Secondary | ICD-10-CM | POA: Diagnosis not present

## 2016-10-22 DIAGNOSIS — Z6841 Body Mass Index (BMI) 40.0 and over, adult: Secondary | ICD-10-CM

## 2016-10-22 DIAGNOSIS — Z8249 Family history of ischemic heart disease and other diseases of the circulatory system: Secondary | ICD-10-CM

## 2016-10-22 DIAGNOSIS — R079 Chest pain, unspecified: Secondary | ICD-10-CM | POA: Diagnosis present

## 2016-10-22 DIAGNOSIS — I5189 Other ill-defined heart diseases: Secondary | ICD-10-CM | POA: Diagnosis present

## 2016-10-22 DIAGNOSIS — E7800 Pure hypercholesterolemia, unspecified: Secondary | ICD-10-CM | POA: Diagnosis not present

## 2016-10-22 HISTORY — DX: Chronic diastolic (congestive) heart failure: I50.32

## 2016-10-22 HISTORY — DX: Other specified heart block: I45.5

## 2016-10-22 LAB — CBC
HCT: 45.3 % (ref 36.0–46.0)
HCT: 45 % (ref 36.0–46.0)
HEMOGLOBIN: 14.9 g/dL (ref 12.0–15.0)
Hemoglobin: 15.1 g/dL — ABNORMAL HIGH (ref 12.0–15.0)
MCH: 28.9 pg (ref 26.0–34.0)
MCH: 28.8 pg (ref 26.0–34.0)
MCHC: 33.3 g/dL (ref 30.0–36.0)
MCHC: 33.1 g/dL (ref 30.0–36.0)
MCV: 86.6 fL (ref 78.0–100.0)
MCV: 86.9 fL (ref 78.0–100.0)
Platelets: 213 10*3/uL (ref 150–400)
Platelets: 198 10*3/uL (ref 150–400)
RBC: 5.23 MIL/uL — AB (ref 3.87–5.11)
RBC: 5.18 MIL/uL — ABNORMAL HIGH (ref 3.87–5.11)
RDW: 15.1 % (ref 11.5–15.5)
RDW: 15.1 % (ref 11.5–15.5)
WBC: 13.3 10*3/uL — ABNORMAL HIGH (ref 4.0–10.5)
WBC: 11.8 10*3/uL — ABNORMAL HIGH (ref 4.0–10.5)

## 2016-10-22 LAB — CREATININE, SERUM
Creatinine, Ser: 0.99 mg/dL (ref 0.44–1.00)
GFR calc Af Amer: >60 mL/min (ref >60)
GFR calc non Af Amer: >60 mL/min (ref >60)

## 2016-10-22 LAB — GLUCOSE, CAPILLARY
GLUCOSE-CAPILLARY: 186 mg/dL — AB (ref 65–99)
Glucose-Capillary: 184 mg/dL — ABNORMAL HIGH (ref 65–99)

## 2016-10-22 LAB — BASIC METABOLIC PANEL
ANION GAP: 8 (ref 5–15)
BUN: 15 mg/dL (ref 6–20)
CHLORIDE: 103 mmol/L (ref 101–111)
CO2: 28 mmol/L (ref 22–32)
Calcium: 9.3 mg/dL (ref 8.9–10.3)
Creatinine, Ser: 1.04 mg/dL — ABNORMAL HIGH (ref 0.44–1.00)
GFR calc non Af Amer: >60 mL/min (ref >60)
Glucose, Bld: 149 mg/dL — ABNORMAL HIGH (ref 65–99)
Potassium: 4.5 mmol/L (ref 3.5–5.1)
Sodium: 139 mmol/L (ref 135–145)

## 2016-10-22 LAB — BRAIN NATRIURETIC PEPTIDE: B Natriuretic Peptide: 24.4 pg/mL (ref 0.0–100.0)

## 2016-10-22 LAB — TROPONIN I: Troponin I: <0.03 ng/mL (ref <0.03)

## 2016-10-22 LAB — I-STAT TROPONIN, ED: Troponin i, poc: 0.01 ng/mL (ref 0.00–0.08)

## 2016-10-22 MED ORDER — TIOTROPIUM BROMIDE MONOHYDRATE 18 MCG IN CAPS
18.0000 ug | ORAL_CAPSULE | Freq: Every day | RESPIRATORY_TRACT | Status: DC
  Filled 2016-10-22: qty 5

## 2016-10-22 MED ORDER — ACETAMINOPHEN 650 MG RE SUPP: 650.0000 mg | Freq: Four times a day (QID) | RECTAL | Status: DC | PRN

## 2016-10-22 MED ORDER — IPRATROPIUM-ALBUTEROL 0.5-2.5 (3) MG/3ML IN SOLN
3.0000 mL | Freq: Once | RESPIRATORY_TRACT | Status: AC
Start: 2016-10-22 — End: 2016-10-22
  Administered 2016-10-22: 3 mL via RESPIRATORY_TRACT
  Filled 2016-10-22: qty 3

## 2016-10-22 MED ORDER — ENOXAPARIN SODIUM 80 MG/0.8ML ~~LOC~~ SOLN
70.0000 mg | SUBCUTANEOUS | Status: DC
  Administered 2016-10-22 – 2016-10-26 (×5): 70 mg via SUBCUTANEOUS
  Filled 2016-10-22 (×5): qty 0.8

## 2016-10-22 MED ORDER — NICOTINE 7 MG/24HR TD PT24: 7.0000 mg | MEDICATED_PATCH | Freq: Every day | TRANSDERMAL | Status: DC

## 2016-10-22 MED ORDER — LISINOPRIL 10 MG PO TABS
20.0000 mg | ORAL_TABLET | Freq: Every day | ORAL | Status: DC
  Administered 2016-10-23: 20 mg via ORAL
  Filled 2016-10-22 (×2): qty 2

## 2016-10-22 MED ORDER — ACETAMINOPHEN 325 MG PO TABS
650.0000 mg | ORAL_TABLET | Freq: Four times a day (QID) | ORAL | Status: DC | PRN
  Administered 2016-10-22 – 2016-10-24 (×2): 650 mg via ORAL
  Filled 2016-10-22 (×3): qty 2

## 2016-10-22 MED ORDER — IPRATROPIUM-ALBUTEROL 0.5-2.5 (3) MG/3ML IN SOLN
3.0000 mL | Freq: Four times a day (QID) | RESPIRATORY_TRACT | Status: DC
  Administered 2016-10-22 – 2016-10-23 (×3): 3 mL via RESPIRATORY_TRACT
  Filled 2016-10-22 (×3): qty 3

## 2016-10-22 MED ORDER — FUROSEMIDE 10 MG/ML IJ SOLN
40.0000 mg | Freq: Once | INTRAMUSCULAR | Status: AC
  Administered 2016-10-22: 40 mg via INTRAVENOUS
  Filled 2016-10-22: qty 4

## 2016-10-22 MED ORDER — INSULIN ASPART 100 UNIT/ML ~~LOC~~ SOLN
0.0000 [IU] | Freq: Three times a day (TID) | SUBCUTANEOUS | Status: DC
  Administered 2016-10-23 (×2): 2 [IU] via SUBCUTANEOUS
  Administered 2016-10-23: 3 [IU] via SUBCUTANEOUS
  Administered 2016-10-24 (×2): 2 [IU] via SUBCUTANEOUS
  Administered 2016-10-24 – 2016-10-25 (×3): 1 [IU] via SUBCUTANEOUS
  Administered 2016-10-26: 2 [IU] via SUBCUTANEOUS
  Administered 2016-10-26: 3 [IU] via SUBCUTANEOUS
  Administered 2016-10-26 – 2016-10-27 (×2): 2 [IU] via SUBCUTANEOUS
  Administered 2016-10-27: 1 [IU] via SUBCUTANEOUS

## 2016-10-22 MED ORDER — NITROGLYCERIN 0.4 MG SL SUBL
0.4000 mg | SUBLINGUAL_TABLET | Freq: Once | SUBLINGUAL | Status: AC
  Administered 2016-10-22: 0.4 mg via SUBLINGUAL

## 2016-10-22 MED ORDER — SODIUM CHLORIDE 0.9% FLUSH: 3.0000 mL | INTRAVENOUS | Status: DC | PRN

## 2016-10-22 MED ORDER — GABAPENTIN 300 MG PO CAPS: 300.0000 mg | ORAL_CAPSULE | Freq: Every evening | ORAL | Status: DC | PRN

## 2016-10-22 MED ORDER — ATORVASTATIN CALCIUM 40 MG PO TABS
40.0000 mg | ORAL_TABLET | Freq: Every day | ORAL | Status: DC
  Administered 2016-10-22 – 2016-10-26 (×5): 40 mg via ORAL
  Filled 2016-10-22 (×5): qty 1

## 2016-10-22 MED ORDER — CARVEDILOL 25 MG PO TABS
25.0000 mg | ORAL_TABLET | Freq: Two times a day (BID) | ORAL | Status: DC
  Administered 2016-10-22: 25 mg via ORAL
  Filled 2016-10-22: qty 1

## 2016-10-22 MED ORDER — TRAZODONE HCL 50 MG PO TABS: 25.0000 mg | ORAL_TABLET | Freq: Every evening | ORAL | Status: DC | PRN

## 2016-10-22 MED ORDER — DOCUSATE SODIUM 100 MG PO CAPS
100.0000 mg | ORAL_CAPSULE | Freq: Two times a day (BID) | ORAL | Status: DC
  Administered 2016-10-22 – 2016-10-27 (×10): 100 mg via ORAL
  Filled 2016-10-22 (×10): qty 1

## 2016-10-22 MED ORDER — ASPIRIN 325 MG PO TABS
325.0000 mg | ORAL_TABLET | Freq: Once | ORAL | Status: AC
Start: 2016-10-22 — End: 2016-10-22
  Administered 2016-10-22: 325 mg via ORAL

## 2016-10-22 MED ORDER — SPIRONOLACTONE 25 MG PO TABS
25.0000 mg | ORAL_TABLET | Freq: Every day | ORAL | Status: DC
  Administered 2016-10-22 – 2016-10-24 (×3): 25 mg via ORAL
  Filled 2016-10-22 (×3): qty 1

## 2016-10-22 MED ORDER — SODIUM CHLORIDE 0.9% FLUSH
3.0000 mL | Freq: Two times a day (BID) | INTRAVENOUS | Status: DC
  Administered 2016-10-22 – 2016-10-27 (×8): 3 mL via INTRAVENOUS

## 2016-10-22 MED ORDER — ASPIRIN EC 81 MG PO TBEC
81.0000 mg | DELAYED_RELEASE_TABLET | Freq: Every day | ORAL | Status: DC
Start: 2016-10-22 — End: 2016-10-27
  Administered 2016-10-23 – 2016-10-27 (×5): 81 mg via ORAL
  Filled 2016-10-22 (×6): qty 1

## 2016-10-22 MED ORDER — SODIUM CHLORIDE 0.9 % IV SOLN: 250.0000 mL | INTRAVENOUS | Status: DC | PRN

## 2016-10-22 MED ORDER — POLYETHYLENE GLYCOL 3350 17 G PO PACK: 17.0000 g | PACK | Freq: Every day | ORAL | Status: DC | PRN

## 2016-10-22 MED ORDER — TIZANIDINE HCL 2 MG PO TABS
2.0000 mg | ORAL_TABLET | Freq: Every evening | ORAL | Status: DC | PRN
  Filled 2016-10-22: qty 1

## 2016-10-22 MED ORDER — NICOTINE 21 MG/24HR TD PT24
21.0000 mg | MEDICATED_PATCH | TRANSDERMAL | Status: DC
  Administered 2016-10-22 – 2016-10-26 (×5): 21 mg via TRANSDERMAL
  Filled 2016-10-22 (×5): qty 1

## 2016-10-22 MED ORDER — MOMETASONE FURO-FORMOTEROL FUM 200-5 MCG/ACT IN AERO
2.0000 | INHALATION_SPRAY | Freq: Two times a day (BID) | RESPIRATORY_TRACT | Status: DC
  Administered 2016-10-22 – 2016-10-27 (×9): 2 via RESPIRATORY_TRACT
  Filled 2016-10-22: qty 8.8

## 2016-10-22 NOTE — Assessment & Plan Note (Signed)
Given history, exam findings of volume overload, increased WOB, and 19 lb weight increase over baseline, concerned for acute CHF exacerbation. PE also remains in differential given worsening of symptoms despite increase of Lasix and report of R LE with greater edema than L LE.  -send to ED for further evaluation  -would recommend CXR and BNP to evaluate for acute CHF  -would consider D dimer and lower extremity dopplers to evaluate for potential VTE

## 2016-10-22 NOTE — ED Triage Notes (Addendum)
Per EMS pt from [facility] for evaluation central Chest Tightness and shortness of breath over the past 2 weeks. Patient increased lasix dose last week from "5 pills to 8 pills" on her own. Patient states doesn't feel like she's getting better. Pt endorses mild nausea intermittent with chest pain and nose bleeds and dizziness intermittently for the past two weeks as well. Pt endorses swelling in bilateral legs, hands and stomach. Pt denies any missed doses of medications.    Pt took 1 81 mg ASA tab at home. At PCP 3 more 81 mg ASA tab administered for 324 mg ASA total. Patient given 1 0.4 mg SL nitroglycerin at PCP with some relief of chest tightness from 8/10- 5/10.

## 2016-10-22 NOTE — Progress Notes (Addendum)
Contacted by nursing concerning pauses on rhythm strip. Endorsed chest tightness and shortness of breath during pause. Will obtain EKG and repeat Troponin. Discussed with Cardiology. Recommended discontinuing beta blocker and continuing to monitor. Coreg discontinued. Will follow up EKG and troponin when available.   Dr. Gerlean Ren 10/22/16, 8:48 PM   ADDENDUM: Contacted by nursing that patient has continued to have pauses in CV strip and occasionally goes bradycardic to 30s. Patient has returned to normal hour and denies symptoms. Officially consulted Cardiology, who will see patient tonight.  10/23/15, .9:15PM

## 2016-10-22 NOTE — ED Notes (Signed)
Pt to xray

## 2016-10-22 NOTE — ED Notes (Signed)
SpO2 dropping to 89% RA while patient sleeps - has Hx sleep apnea; pt placed on 2LNC and SpO2 97%.

## 2016-10-22 NOTE — ED Provider Notes (Signed)
[facility] DEPT Provider Note   CSN: [id number] Arrival date & time: 10/22/16  1043     History   Chief Complaint Chief Complaint  Patient presents with  . Chest Pain    HPI Taleeya Blondin is a 56 y.o. female.  The history is provided by the patient and medical records. No language interpreter was used.  Chest Pain   Associated symptoms include cough and shortness of breath. Pertinent negatives include no abdominal pain, no back pain, no fever, no headaches, no nausea, no palpitations and no vomiting.   Delissa Silba is a 56 y.o. female  with a PMH of CHF, COPD, DM, HTN, HLD who presents to the Emergency Department complaining of worsening lower extremity edema for the last 2 weeks, right > left. She typically takes 3 Lasix pills in the morning and 3 at night, she has been taking 8 Lasix pills all at once in the morning to try to get off the fluid. 3 days ago, she noticed a central nonradiating chest pressure. Patient states pressure lasts about 5 minutes and then will resolve on its own. Sometimes will occur at rest does not seem to be exertional. She states that her breathing has been more shallow and more difficult than usual. She does feel short of breath while at rest, but worse with exertion. She is having to sit straight up in her chair while sleeping. Also endorses a productive cough. She is a daily smoker. No fevers or chills.   Past Medical History:  Diagnosis Date  . Arthritis    "knees" (02/21/2014), back  . CHF (congestive heart failure) ([place])   . Chronic bronchitis ([place])    "get it q yr" (02/21/2014)  . Chronic lower back pain   . Complication of anesthesia    "I don't come out well; I chew on my tongue"  . COPD (chronic obstructive pulmonary disease) ([place])   . Diabetes mellitus without complication ([place])   . Fibroid   . GERD (gastroesophageal reflux disease)   . Heart murmur   . Hypertension   . Infection of right eye    current dx on Saturday 7/15 - using drops    . Migraine 1982-2009  . MVP (mitral valve prolapse)   . Neuromuscular disorder ([place])    right leg nerve pain  . Shortness of breath   . Sleep apnea 02/2016   recent hospitalization noted apnea during stay no sleep studt performed since this episode  . Stroke [facility]) noted on CAT 02/2014   "light", LLE weakness remains (03/30/2014)  . 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 Tingling in extremities 12/12/2010   Uric acid and electrolytes (02/23) normal but WBC elevated.   D/Dx: carpal tunnel, ulnar neuropathy Less likely: cervical radiculopathy, vasculitis     Patient Active Problem List   Diagnosis Date Noted  . Chest pressure 10/22/2016  . Chest pain 10/22/2016  . Nephrolithiasis 09/18/2016  . Knee pain, bilateral 09/09/2016  . Lumbar radiculopathy 06/02/2016  . Vaginitis and vulvovaginitis 03/18/2016  . Post-menopausal bleeding 03/18/2016  . Possible exposure to STD 03/18/2016  . Hyperlipidemia 02/07/2016  . SOB (shortness of breath)   . COPD exacerbation ([place]) 01/18/2016  . Respiratory distress   . Chronic diastolic heart failure ([place])   . T2DM (type 2 diabetes mellitus) ([place]) 12/07/2015  . RUQ abdominal pain 09/21/2015  . Occipital lymphadenopathy 09/21/2015  . Hydrosalpinx 07/05/2015  . Depression 04/19/2015  .  Abnormal uterine bleeding 12/07/2014  . Allergic rhinitis 08/01/2014  . Rash and nonspecific skin eruption 08/01/2014  . Eczema 08/01/2014  . Diminished vision 06/23/2014  . Chronic diastolic CHF (congestive heart failure) ([place]) 06/13/2014  . Restrictive lung disease 05/10/2014  . NASH (nonalcoholic steatohepatitis) 01/04/2014  . Diastolic dysfunction 38/88/2800  . Hypertension 05/26/2010  . Obesity 12/17/2006  . TOBACCO DEPENDENCE 12/17/2006    Past Surgical History:  Procedure Laterality Date  . [place]  . DILATATION & CURETTAGE/HYSTEROSCOPY WITH MYOSURE N/A 05/06/2016   Procedure:  DILATATION & CURETTAGE/HYSTEROSCOPY WITH MYOSURE;  Surgeon: Terrance Mass, MD;  Location: [facility];  Service: Gynecology;  Laterality: N/A;  request to follow around 8:45  requests one hour OR time  . DILATION AND CURETTAGE OF UTERUS    . FOOT SURGERY Bilateral    "took bones out; put pins in"  . LEFT AND RIGHT HEART CATHETERIZATION WITH CORONARY ANGIOGRAM N/A 03/31/2014   Procedure: LEFT AND RIGHT HEART CATHETERIZATION WITH CORONARY ANGIOGRAM;  Surgeon: Birdie Riddle, MD;  Location: [facility];  Service: Cardiovascular;  Laterality: N/A;  . MYOMECTOMY      OB History    Gravida Para Term Preterm AB Living   1 1       1    SAB TAB Ectopic Multiple Live Births                   Home Medications    Prior to Admission medications   Medication Sig Start Date End Date Taking? Authorizing Provider  albuterol (PROVENTIL HFA;VENTOLIN HFA) 108 (90 Base) MCG/ACT inhaler Inhale 2 puffs into the lungs every 6 (six) hours as needed for wheezing or shortness of breath.    Yes Historical Provider, MD  aspirin 81 MG tablet Take 81 mg by mouth daily.   Yes Historical Provider, MD  atorvastatin (LIPITOR) 40 MG tablet Take 1 tablet (40 mg total) by mouth daily. 02/07/16  Yes Zenia Resides, MD  carvedilol (COREG) 25 MG tablet TAKE ONE TABLET BY MOUTH TWICE DAILY WITH A MEAL 07/15/16  Yes Vivi Barrack, MD  furosemide (LASIX) 40 MG tablet Take 3 tablets (120 mg total) by mouth 2 (two) times daily. Patient taking differently: Take 160 mg by mouth daily.  03/20/16  Yes Vivi Barrack, MD  gabapentin (NEURONTIN) 300 MG capsule Take 300 mg by mouth at bedtime as needed (pain).  01/26/16  Yes Historical Provider, MD  lisinopril (PRINIVIL,ZESTRIL) 10 MG tablet TAKE TWO TABLETS BY MOUTH ONCE DAILY 08/13/16  Yes Vivi Barrack, MD  mometasone-formoterol [facility]) 200-5 MCG/ACT AERO Inhale 2 puffs into the lungs 2 (two) times daily.   Yes Historical Provider, MD  Spacer/Aero-Holding Chambers (E-Z SPACER) inhaler  Use as instructed 01/22/16  Yes Olam Idler, MD  spironolactone (ALDACTONE) 25 MG tablet Take 1 tablet (25 mg total) by mouth daily. 08/19/16  Yes Vivi Barrack, MD  tiotropium (SPIRIVA) 18 MCG inhalation capsule Place 1 capsule (18 mcg total) into inhaler and inhale daily. 03/18/16  Yes Kinnie Feil, MD  triamcinolone cream (KENALOG) 0.1 % Apply 1 application topically 3 (three) times daily as needed (eczema on arms). 05/21/16  Yes Vivi Barrack, MD  hydrocortisone 1 % ointment Apply 1 application topically 2 (two) times daily as needed (eczema on face). 05/21/16   Vivi Barrack, MD  ipratropium (ATROVENT) 0.06 % nasal spray Place 2 sprays into both nostrils 4 (four) times daily. 08/19/16 08/26/16  Caleb  Burnetta Sabin, MD  meloxicam (MOBIC) 15 MG tablet Take 15 mg by mouth daily as needed for pain.  01/25/16   Historical Provider, MD  metFORMIN (GLUCOPHAGE) 1000 MG tablet Take 1 tablet (1,000 mg total) by mouth 2 (two) times daily with a meal. Patient not taking: Reported on 10/22/2016 08/07/16   Vivi Barrack, MD  tamsulosin (FLOMAX) 0.4 MG CAPS capsule Take 1 capsule (0.4 mg total) by mouth daily. Patient not taking: Reported on 10/22/2016 09/18/16   Elberta Leatherwood, MD  tiZANidine (ZANAFLEX) 2 MG tablet Take 1 tablet by mouth at bedtime as needed for muscle spasms.  03/25/16   Historical Provider, MD  trimethoprim-polymyxin b (POLYTRIM) ophthalmic solution Place 1 drop into both eyes every 4 (four) hours. Patient not taking: Reported on 10/22/2016 05/03/16   Janne Napoleon, NP    Family History Family History  Problem Relation Age of Onset  . Hypertension Mother   . Hypertension Father   . Cancer Sister     UTERINE???    Social History Social History  Substance Use Topics  . Smoking status: Current Every Day Smoker    Packs/day: 0.50    Years: 37.00    Types: Cigarettes  . Smokeless tobacco: Never Used     Comment: taking chantix  . Alcohol use No     Allergies   Patient has no known  allergies.   Review of Systems Review of Systems  Constitutional: Negative for chills and fever.  HENT: Negative for congestion.   Eyes: Negative for visual disturbance.  Respiratory: Positive for cough and shortness of breath.   Cardiovascular: Positive for chest pain and leg swelling. Negative for palpitations.  Gastrointestinal: Negative for abdominal pain, nausea and vomiting.  Genitourinary: Negative for dysuria.  Musculoskeletal: Negative for back pain and neck pain.  Skin: Negative for rash.  Neurological: Negative for headaches.     Physical Exam Updated Vital Signs BP 172/100   Pulse 69   Temp 98 F (36.7 C) (Oral)   Resp 19   Ht 5' 4"  (1.626 m)   Wt (!) 144.7 kg   LMP 08/31/2015   SpO2 98%   BMI 54.76 kg/m   Physical Exam  Constitutional: She is oriented to person, place, and time. She appears well-developed and well-nourished. No distress.  HENT:  Head: Normocephalic and atraumatic.  Cardiovascular: Normal rate, regular rhythm and normal heart sounds.   No murmur heard. Pulmonary/Chest: Effort normal. No respiratory distress. She exhibits no tenderness.  Diminished bilaterally, faint wheezing bilaterally, crackles to left lower lung field.  Abdominal: Soft. She exhibits no distension. There is no tenderness.  Musculoskeletal: Normal range of motion.  2+ pitting lower extremity edema, right leg >  Left. 2+ DP pulses. Sensation intact.  Neurological: She is alert and oriented to person, place, and time.  Skin: Skin is warm and dry.  Nursing note and vitals reviewed.     ED Treatments / Results  Labs (all labs ordered are listed, but only abnormal results are displayed) Labs Reviewed  BASIC METABOLIC PANEL - Abnormal; Notable for the following:       Result Value   Glucose, Bld 149 (*)    Creatinine, Ser 1.04 (*)    All other components within normal limits  CBC - Abnormal; Notable for the following:    WBC 13.3 (*)    RBC 5.23 (*)    Hemoglobin  15.1 (*)    All other components within normal limits  BRAIN NATRIURETIC PEPTIDE  Randolm Idol, ED    EKG  EKG Interpretation  Date/Time:  Wednesday October 22 2016 10:43:53 EST Ventricular Rate:  69 PR Interval:    QRS Duration: 83 QT Interval:  426 QTC Calculation: 457 R Axis:   -3 Text Interpretation:  Sinus rhythm Low voltage, precordial leads Anteroseptal infarct, old since last tracing no significant change Confirmed by Eulis Foster  MD, ELLIOTT [phone]) on 10/22/2016 12:51:23 PM       Radiology Dg Chest 2 View  Result Date: 10/22/2016 CLINICAL DATA:  Move Central chest tightness, shortness of breath EXAM: CHEST  2 VIEW COMPARISON:  01/21/2016 FINDINGS: Heart and mediastinal contours are within normal limits. No focal opacities or effusions. No acute bony abnormality. IMPRESSION: No active cardiopulmonary disease. Electronically Signed   By: Rolm Baptise M.D.   On: 10/22/2016 11:16    Procedures Procedures (including critical care time)  Medications Ordered in ED Medications  ipratropium-albuterol (DUONEB) 0.5-2.5 (3) MG/3ML nebulizer solution 3 mL (3 mLs Nebulization Given 10/22/16 1138)     Initial Impression / Assessment and Plan / ED Course  I have reviewed the triage vital signs and the nursing notes.  Pertinent labs & imaging results that were available during my care of the patient were reviewed by me and considered in my medical decision making (see chart for details).  Clinical Course    Chriselda Leppert is a 56 y.o. female who presents to ED for lower Extremity swelling, chest pain and shortness of breath. She was seen initially by her primary care provider this morning who referred her to the ER today for concerns for ACS versus CHF exacerbation versus PE. On exam, patient does have significant lower extremity pitting edema with right leg much worse than left. She does have faint expiratory wheezing as well as mild crackles to the left lower lung base. She is afebrile  with a normal heart rate and maintaining O2 greater than 95% on room air. Initial troponin negative, EKG reassuring. BNP within defined limits and bilateral lower extremity Doppler negative for DVT. She does have a heart score of 4. She has never undergone a cardiac catheterization or stress test. Discussed case with the family practice teaching service who evaluated patient and will admit.    Final Clinical Impressions(s) / ED Diagnoses   Final diagnoses:  Chest pain, unspecified type  Lower extremity edema    New Prescriptions New Prescriptions   No medications on file     [facility], PA-C 10/22/16 [place], MD 10/22/16 1714

## 2016-10-22 NOTE — Assessment & Plan Note (Signed)
May be related to CHF exacerbation or PE however there is also concern for ACS. EKG obtained in clinic and was negative for ST segment changes and was largely unchanged from comparison EKG in Oct 2017. Three 81 mg ASA were given as patient had taken 81 mg at home prior to appointment. 1 nitro tab was also given in office.  -patient transported to ED by EMS  -would recommend repeat EKG and troponin

## 2016-10-22 NOTE — ED Notes (Signed)
Pt heart rate intermittently dropping to 44-48bpm, pt reports hx of the same; Norene PA aware and states no further action at this time. Pt remains on cardiac monitoring.

## 2016-10-22 NOTE — ED Notes (Signed)
Pt returned to room from imaging department.

## 2016-10-22 NOTE — H&P (Addendum)
Patient seen at the ED with the resident and medical student.  She is a 56 y/O F with PMX of COPD, CHF,HTN, DM2, HLD who was sent to the ED by her PCP for dyspnea and leg swelling. She stated her SOB has been worsening over the last few days associated with central chest pressure like pain. She uses 4 pillows at baseline, no cough, no fever, no sick contact. She endorsed worsening leg swelling as well as she has been self titrating her home lasix up to 160 mg qd with no improvement of her symptoms.  No current facility-administered medications on file prior to encounter.    Current Outpatient Prescriptions on File Prior to Encounter  Medication Sig Dispense Refill  . albuterol (PROVENTIL HFA;VENTOLIN HFA) 108 (90 Base) MCG/ACT inhaler Inhale 2 puffs into the lungs every 6 (six) hours as needed for wheezing or shortness of breath.     Marland Kitchen aspirin 81 MG tablet Take 81 mg by mouth daily.    Marland Kitchen atorvastatin (LIPITOR) 40 MG tablet Take 1 tablet (40 mg total) by mouth daily. 90 tablet 1  . carvedilol (COREG) 25 MG tablet TAKE ONE TABLET BY MOUTH TWICE DAILY WITH A MEAL 60 tablet 5  . furosemide (LASIX) 40 MG tablet Take 3 tablets (120 mg total) by mouth 2 (two) times daily. (Patient taking differently: Take 160 mg by mouth daily. ) 180 tablet 2  . gabapentin (NEURONTIN) 300 MG capsule Take 300 mg by mouth at bedtime as needed (pain).   0  . lisinopril (PRINIVIL,ZESTRIL) 10 MG tablet TAKE TWO TABLETS BY MOUTH ONCE DAILY 90 tablet 3  . mometasone-formoterol (DULERA) 200-5 MCG/ACT AERO Inhale 2 puffs into the lungs 2 (two) times daily.    Marland Kitchen Spacer/Aero-Holding Chambers (E-Z SPACER) inhaler Use as instructed 1 each 2  . spironolactone (ALDACTONE) 25 MG tablet Take 1 tablet (25 mg total) by mouth daily. 90 tablet 3  . tiotropium (SPIRIVA) 18 MCG inhalation capsule Place 1 capsule (18 mcg total) into inhaler and inhale daily. 30 capsule 1  . triamcinolone cream (KENALOG) 0.1 % Apply 1 application topically 3  (three) times daily as needed (eczema on arms). 30 g 1  . hydrocortisone 1 % ointment Apply 1 application topically 2 (two) times daily as needed (eczema on face). 30 g 1  . ipratropium (ATROVENT) 0.06 % nasal spray Place 2 sprays into both nostrils 4 (four) times daily. 15 mL 0  . meloxicam (MOBIC) 15 MG tablet Take 15 mg by mouth daily as needed for pain.   0  . metFORMIN (GLUCOPHAGE) 1000 MG tablet Take 1 tablet (1,000 mg total) by mouth 2 (two) times daily with a meal. (Patient not taking: Reported on 10/22/2016) 180 tablet 3  . tamsulosin (FLOMAX) 0.4 MG CAPS capsule Take 1 capsule (0.4 mg total) by mouth daily. (Patient not taking: Reported on 10/22/2016) 30 capsule 0  . tiZANidine (ZANAFLEX) 2 MG tablet Take 1 tablet by mouth at bedtime as needed for muscle spasms.   0  . trimethoprim-polymyxin b (POLYTRIM) ophthalmic solution Place 1 drop into both eyes every 4 (four) hours. (Patient not taking: Reported on 10/22/2016) 10 mL 0  . [DISCONTINUED] loratadine (CLARITIN) 10 MG tablet Take 1 tablet (10 mg total) by mouth daily. (Patient not taking: Reported on 06/14/2015) 30 tablet 11  . [DISCONTINUED] nortriptyline (PAMELOR) 25 MG capsule Take 1 capsule (25 mg total) by mouth at bedtime. Take 1 capsule (25 mg total) by mouth at bedtime for one week  then increase to 2 capsules (50 mg total) by mouth at bedtime. (Patient taking differently: Take 50 mg by mouth at bedtime. Take 1 capsule (25 mg total) by mouth at bedtime for one week then increase to 2 capsules (50 mg total) by mouth at bedtime.) 53 capsule 0  . [DISCONTINUED] omeprazole (PRILOSEC) 20 MG capsule Take 1 capsule (20 mg total) by mouth daily. (Patient not taking: Reported on 07/05/2015) 30 capsule 0  . [DISCONTINUED] sertraline (ZOLOFT) 50 MG tablet Take 1 tablet (50 mg total) by mouth daily. (Patient not taking: Reported on 06/14/2015) 30 tablet 3   Past Medical History:  Diagnosis Date  . Arthritis    "knees" (02/21/2014), back  . CHF  (congestive heart failure) ([place])   . Chronic bronchitis ([place])    "get it q yr" (02/21/2014)  . Chronic lower back pain   . Complication of anesthesia    "I don't come out well; I chew on my tongue"  . COPD (chronic obstructive pulmonary disease) ([place])   . Diabetes mellitus without complication ([place])   . Fibroid   . GERD (gastroesophageal reflux disease)   . Heart murmur   . Hypertension   . Infection of right eye    current dx on Saturday 7/15 - using drops  . Migraine 1982-2009  . MVP (mitral valve prolapse)   . Neuromuscular disorder ([place])    right leg nerve pain  . Shortness of breath   . Sleep apnea 02/2016   recent hospitalization noted apnea during stay no sleep studt performed since this episode  . Stroke [facility]) noted on CAT 02/2014   "light", LLE weakness remains (03/30/2014)  . 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 Tingling in extremities 12/12/2010   Uric acid and electrolytes (02/23) normal but WBC elevated.   D/Dx: carpal tunnel, ulnar neuropathy Less likely: cervical radiculopathy, vasculitis    Vitals:   10/22/16 1430 10/22/16 1500 10/22/16 1515 10/22/16 1557  BP: 172/100 146/90 156/90 (!) 145/93  Pulse: 69 70 71 71  Resp: 19 17 14 18   Temp:    98.3 F (36.8 C)  TempSrc:    Oral  SpO2: 98% 95% 96% 97%  Weight:    (!) 313 lb 1.6 oz (142 kg)  Height:    5' 4"  (1.626 m)   Dg Chest 2 View  Result Date: 10/22/2016 CLINICAL DATA:  Move Central chest tightness, shortness of breath EXAM: CHEST  2 VIEW COMPARISON:  01/21/2016 FINDINGS: Heart and mediastinal contours are within normal limits. No focal opacities or effusions. No acute bony abnormality. IMPRESSION: No active cardiopulmonary disease. Electronically Signed   By: Rolm Baptise M.D.   On: 10/22/2016 11:16   Exam: Gen: Mobidly obese, calm in bed, not in distress. HEENT: EOMI, PERRLA, no thyromegaly. Neuro: Awake and alert, oriented x 3. Heart: S1 S2 normal, no  murmur. RRR. Resp: Air entry equal with bibasal crackles. Abd: Soft, NT, obese. Ext: Trace B/L LL edema, no erythema, no tenderness.  A/P: 56 Y/O F with 1. Chest pain and Dyspnea: R/O ASC and acute diastolic CHF exacerbation. PE unlikely given normal O2 Sat at baseline.     BNP not suggestive of CHF exacerbation although she does have crackles on lung exam.     Chest xray reviewed as well. She did gain weight from baseline.     I agree with one dose of IV lasix tonight.  Consider switching Lasix to Torsemide.     EKG reviewed with no acute ischemic finding. Troponin neg so far. We will trend troponin.     Consider D-dimer vs CTA chest if O2 Sat drops to R/O PE although unlikely.  2. B/L LL Edema: Likely related to CHF, less likely DVT.     I however agree with doppler U/S of her LL to R/O DVT.     Keep leg elevated.     Monitor for improvement with diuresis.  Other chronic condition. Review resident's note.

## 2016-10-22 NOTE — Progress Notes (Signed)
Subjective:    Hailey Barnes - 56 y.o. female MRN [id number]  Date of birth: 1961-01-23  HPI  Hailey Barnes is here for SDA for lower extremity edema.  Reports lower extremity edema started about 2 weeks ago and has been worsening. She struggled to fit her feet into her shoes today. Reports her right leg is more swollen that the left and this is unusual. She has been increasing her lasix on her own at home. Was taking eight 40 mg tablets daily until yesterday when she ran out of Lasix. Endorses SOB at rest, DOE, and PND. Also having significant orthopnea to the point that she has had to sleep sitting straight up. For the past week she has had on and off sharp chest pain. Currently having chest pressure and tightness. She has a productive cough.    -  reports that she has been smoking Cigarettes.  She has a 18.50 pack-year smoking history. She has never used smokeless tobacco. - Review of Systems: Per HPI. - Past Medical History: Patient Active Problem List   Diagnosis Date Noted  . Chest pressure 10/22/2016  . Nephrolithiasis 09/18/2016  . Knee pain, bilateral 09/09/2016  . Lumbar radiculopathy 06/02/2016  . Vaginitis and vulvovaginitis 03/18/2016  . Post-menopausal bleeding 03/18/2016  . Possible exposure to STD 03/18/2016  . Hyperlipidemia 02/07/2016  . SOB (shortness of breath)   . COPD exacerbation ([place]) 01/18/2016  . Respiratory distress   . Chronic diastolic heart failure ([place])   . T2DM (type 2 diabetes mellitus) ([place]) 12/07/2015  . RUQ abdominal pain 09/21/2015  . Occipital lymphadenopathy 09/21/2015  . Hydrosalpinx 07/05/2015  . Depression 04/19/2015  . Abnormal uterine bleeding 12/07/2014  . Allergic rhinitis 08/01/2014  . Rash and nonspecific skin eruption 08/01/2014  . Eczema 08/01/2014  . Diminished vision 06/23/2014  . Chronic diastolic CHF (congestive heart failure) ([place]) 06/13/2014  . Restrictive lung disease 05/10/2014  . NASH (nonalcoholic steatohepatitis)  01/04/2014  . Diastolic dysfunction 98/92/1194  . Hypertension 05/26/2010  . Obesity 12/17/2006  . TOBACCO DEPENDENCE 12/17/2006   - Medications: reviewed and updated   Objective:   Physical Exam BP (!) 180/110   Pulse 87   Temp 98 F (36.7 C) (Oral)   Ht 5' 4"  (1.626 m)   Wt (!) 319 lb (144.7 kg)   LMP 08/31/2015   SpO2 96%   BMI 54.76 kg/m  Gen: appears ill and uncomfortable  CV: RRR, good S1/S2, no murmur, 2+ pitting edema to knees bilaterally, R calf measures approximately 1.5 cm larger than left  Resp: labored with movement, struggles to speak in full sentences, tight throughout with poor air movement, faint crackles appreciated in left lower lung base   Assessment & Plan:   SOB (shortness of breath) Given history, exam findings of volume overload, increased WOB, and 19 lb weight increase over baseline, concerned for acute CHF exacerbation. PE also remains in differential given worsening of symptoms despite increase of Lasix and report of R LE with greater edema than L LE.  -send to ED for further evaluation  -would recommend CXR and BNP to evaluate for acute CHF  -would consider D dimer and lower extremity dopplers to evaluate for potential VTE   Chest pressure May be related to CHF exacerbation or PE however there is also concern for ACS. EKG obtained in clinic and was negative for ST segment changes and was largely unchanged from comparison EKG in Oct 2017. Three 81 mg ASA were given as patient had taken  81 mg at home prior to appointment. 1 nitro tab was also given in office.  -patient transported to ED by EMS  -would recommend repeat EKG and troponin     Phill Myron, D.O. 10/22/2016, 10:28 AM PGY-2, [place]

## 2016-10-22 NOTE — Progress Notes (Signed)
.  VASCULAR LAB PRELIMINARY  PRELIMINARY  PRELIMINARY  PRELIMINARY  Bilateral lower extremity venous duplex completed.    Preliminary report:   Technically difficult due to body habitus Bilateral:  No obvious evidence of DVT, superficial thrombosis, or Baker's Cyst.   Greogry Goodwyn, RVS 10/22/2016, 12:29 PM

## 2016-10-22 NOTE — Progress Notes (Addendum)
Paged on call md  Regarding patient blood sugar checks, stated would be to see patient. will monitor patient. Hailey Barnes, Bettina Gavia RN

## 2016-10-22 NOTE — ED Notes (Signed)
Admitting at bedside 

## 2016-10-22 NOTE — H&P (Signed)
[facility] Admission History and Physical Service Pager: [phone]  Patient name: Hailey Barnes Medical record number: [id number] Date of birth: 1961-04-05 Age: 56 y.o. Gender: female  Primary Care Provider: Dimas Chyle, MD Consultants: None Code Status: Full  Chief Complaint: Shortness of breath and lower extremity swelling  Assessment and Plan: Hailey Barnes is a 56 y.o. female presenting with shortness of breath and lower extremity swelling. PMH is significant for CHF, COPD, hypertension, diabetes, CVA and sleep apnea.   Dyspnea: Differential diagnosis includes CHF exacerbation but BNP negative. Patient has bibasilar crackles and trace lower extremity edema. Weight up from 300 lbs to 319 lbs in 6 weeks. Dyspnea improved after DuoNeb in ED suggestive for COPD exacerbation. However, she had no increased work of breathing and satting in the upper 90s to 100 on room air although this is after a breathing treatment. Unlikely ACS with negative troponin and normal EKG without ischemic change. Chest pain has resolved as well. Low suspicion for PE with well's score of 0. Lower extremity Doppler without DVT. No signs and symptoms of infection to suggest pneumonia except for mild leukocytosis which is likely stress-induced after steroid.  - Admit to telemetry. Attending Dr. Gwendlyn Deutscher - IV Lasix 40 mg, diagnostic and therapeutic -She may benefit from torsemide instead of by mouth Lasix. She is currently on 120 mg by mouth twice a day  -Cycle troponin to rule out ACS -Get strict I&Os and daily weights.  -Continuous telemetry.  -Echo -Obtain transthoracic echocardiography.  -Could consider CTA if symptoms do not improve or acutely worsen.  -Oxygen as needed -Repeat BMP in a.m.  HFpEF: Equal in 05/2014 with EF of 65-70%, G1 DD and moderate LAE. Patient is on Coreg 25 mg twice a day, lisinopril 20 mg, spironolactone 25 mg daily and Lasix 120 mg twice a day at home -Continue  home meds -Considerswitching to torsemide on discharge. -IV Lasix as above -Echocardiogram  COPD: on Dulera, Spiriva and albuterol at home. Not taking the Spiriva at home. Lung exam without increased work of breathing or wheezing although this was after breathing treatments.  -Continue on Dulera -Schedule DuoNeb every 6 hours -Hold Spiriva  Hypertension: 180/110 in clinic this morning. 676/19-509/32 in ED - Continue home meds - Will give lasix as described above.   Diabetes: Most recent POCT Hgb A1c was 7.3 on 07/30/16.   - Hold metformin 1000 mg PO BID while in the hospital, but plan to restart once discharged.  - SSI-thin  Sleep Apnea: She does not currently use a CPAP machine at home. - CPAP overnight.   Chronic Back Pain: At this time she does not have any complaints about her back, but states that this has been a problem for in the past that has been treated with medication. - Continue home regimen of meloxicam 15 mg PO PRN and gabapentin 300 mg PO at night PRN, both for pain.   Depression: Doesn't appear to be on any medication. Euthymic mood with congruent affect -Monitor  FEN/GI:  -Heart healthy/Carb modified  Prophylaxis:  Lovenox for VTE ppx   Disposition: Admit to floor for observation and ACS rule out  History of Present Illness:  Hailey Barnes is a 56 y.o. female with PMH significant for CHF, COPD, hypertension, diabetes, CVA and sleep apnea presenting with lower extremity swelling and shortness of breath. About 2-3 weeks ago she had an increase in swelling of her lower extremities, more on the right than the left. In addition, over the  past 3-4 days, she has had shortness of breath associated with chest pain that is described as a tightness of the chest. Typically, she sleeps on 4 pillows at home and has dyspnea on exertion with house work and cooking. The chest pain she associates with her shortness of breath occurs at rest, is described as a pressure over her  central chest that does not radiate anywhere else in her body. Finally, she also says that she feels that her belly is swollen recently. To combat her symptoms, she has increased her at-home lasix from six 20 mg pills daily to eight 20 mg pills daily with some relief of her symptoms, but not complete or adequate resolution to her. She denies any new weakness, lightheadedness, abdominal pain, diarrhea or constipation but does state that she has had a small amount of continuous vaginal bleeding since December 18th; however, she is unable to discern with certainty whether this is coming from her urethra or from her vagina. She states that she has not had a period in years. She has not been taking her spiriva recently because she feels like she is taking so many medications. Since the beginning of the holiday season she has not cooked for herself as much, but has eaten more food prepared by other people which she states could also be the cause of her symptoms.   She was initially evaluated in the clinic this morning, but was sent to the ED for further evaluation given concern for PE vs CHF exacerbation vs ACS. Lab work in the ED included a BMP that was normal except for glucose of 149 and creatinine increase to 1.04 from baseline of 0.98, normal BNP and troponin, CBC normal except for WBC of 13.3, RBC of 5.23, and hemoglobin of 15.1. CXR and EKG were normal and a duplex ultrasonography of the lower extremity showed no evidence of DVT. Interventions tried in the ED include ipratropium-albuterol nebulizer, which did provide some relief of her shortness of breath.   Review Of Systems: Per HPI with the following additions:   Review of Systems  Constitutional: Negative for weight loss (Actually has had weight gain recently).  HENT: Negative for congestion.   Eyes: Negative for discharge.  Respiratory: Positive for shortness of breath. Negative for cough.   Cardiovascular: Positive for orthopnea and leg swelling.  Negative for chest pain.  Gastrointestinal: Negative for diarrhea and vomiting.  Genitourinary: Negative for dysuria.  Musculoskeletal: Negative for myalgias.  Skin: Negative for rash.  Neurological: Negative for headaches.  Endo/Heme/Allergies: Bruises/bleeds easily (Had nosebleed last night).  Psychiatric/Behavioral: Negative for substance abuse and suicidal ideas.    Patient Active Problem List   Diagnosis Date Noted  . Chest pressure 10/22/2016  . Chest pain 10/22/2016  . Shortness of breath 10/22/2016  . Lower extremity edema   . Nephrolithiasis 09/18/2016  . Knee pain, bilateral 09/09/2016  . Lumbar radiculopathy 06/02/2016  . Vaginitis and vulvovaginitis 03/18/2016  . Post-menopausal bleeding 03/18/2016  . Possible exposure to STD 03/18/2016  . Hyperlipidemia 02/07/2016  . SOB (shortness of breath)   . COPD exacerbation ([place]) 01/18/2016  . Respiratory distress   . Acute on chronic diastolic congestive heart failure ([place])   . T2DM (type 2 diabetes mellitus) ([place]) 12/07/2015  . RUQ abdominal pain 09/21/2015  . Occipital lymphadenopathy 09/21/2015  . Hydrosalpinx 07/05/2015  . Depression 04/19/2015  . Abnormal uterine bleeding 12/07/2014  . Allergic rhinitis 08/01/2014  . Rash and nonspecific skin eruption 08/01/2014  . Eczema 08/01/2014  .  Diminished vision 06/23/2014  . Chronic diastolic CHF (congestive heart failure) ([place]) 06/13/2014  . Restrictive lung disease 05/10/2014  . NASH (nonalcoholic steatohepatitis) 01/04/2014  . Diastolic dysfunction 35/36/1443  . Hypertension 05/26/2010  . Obesity 12/17/2006  . TOBACCO DEPENDENCE 12/17/2006    Past Medical History: Past Medical History:  Diagnosis Date  . Arthritis    "knees" (02/21/2014), back  . CHF (congestive heart failure) ([place])   . Chronic bronchitis ([place])    "get it q yr" (02/21/2014)  . Chronic lower back pain   . Complication of anesthesia    "I don't come out well; I chew on my tongue"  . COPD  (chronic obstructive pulmonary disease) ([place])   . Diabetes mellitus without complication ([place])   . Fibroid   . GERD (gastroesophageal reflux disease)   . Heart murmur   . Hypertension   . Infection of right eye    current dx on Saturday 7/15 - using drops  . Migraine 1982-2009  . MVP (mitral valve prolapse)   . Neuromuscular disorder ([place])    right leg nerve pain  . Shortness of breath   . Sleep apnea 02/2016   recent hospitalization noted apnea during stay no sleep studt performed since this episode  . Stroke [facility]) noted on CAT 02/2014   "light", LLE weakness remains (03/30/2014)  . 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 Tingling in extremities 12/12/2010   Uric acid and electrolytes (02/23) normal but WBC elevated.   D/Dx: carpal tunnel, ulnar neuropathy Less likely: cervical radiculopathy, vasculitis     Past Surgical History: Past Surgical History:  Procedure Laterality Date  . [place]  . DILATATION & CURETTAGE/HYSTEROSCOPY WITH MYOSURE N/A 05/06/2016   Procedure: DILATATION & CURETTAGE/HYSTEROSCOPY WITH MYOSURE;  Surgeon: Terrance Mass, MD;  Location: [facility];  Service: Gynecology;  Laterality: N/A;  request to follow around 8:45  requests one hour OR time  . DILATION AND CURETTAGE OF UTERUS    . FOOT SURGERY Bilateral    "took bones out; put pins in"  . LEFT AND RIGHT HEART CATHETERIZATION WITH CORONARY ANGIOGRAM N/A 03/31/2014   Procedure: LEFT AND RIGHT HEART CATHETERIZATION WITH CORONARY ANGIOGRAM;  Surgeon: Birdie Riddle, MD;  Location: [facility];  Service: Cardiovascular;  Laterality: N/A;  . MYOMECTOMY      Social History: Social History  Substance Use Topics  . Smoking status: Current Every Day Smoker    Packs/day: 1.00    Years: 37.00    Types: Cigarettes  . Smokeless tobacco: Never Used     Comment: Would like to quit  . Alcohol use No   Additional social history: Says that she lives  alone but is close with her daughter. Is able to perform her ADLs and IADLs, but must take breaks when doing things around the house as she gets short of breath.  Please also refer to relevant sections of EMR.  Family History: Family History  Problem Relation Age of Onset  . Hypertension Mother   . COPD Mother   . Hypertension Father   . Cancer Sister     UTERINE???  . COPD Sister    Allergies and Medications: No Known Allergies No current facility-administered medications on file prior to encounter.    Current Outpatient Prescriptions on File Prior to Encounter  Medication Sig Dispense Refill  . albuterol (PROVENTIL HFA;VENTOLIN HFA) 108 (90 Base) MCG/ACT inhaler Inhale 2  puffs into the lungs every 6 (six) hours as needed for wheezing or shortness of breath.     Marland Kitchen aspirin 81 MG tablet Take 81 mg by mouth daily.    Marland Kitchen atorvastatin (LIPITOR) 40 MG tablet Take 1 tablet (40 mg total) by mouth daily. 90 tablet 1  . carvedilol (COREG) 25 MG tablet TAKE ONE TABLET BY MOUTH TWICE DAILY WITH A MEAL 60 tablet 5  . furosemide (LASIX) 40 MG tablet Take 3 tablets (120 mg total) by mouth 2 (two) times daily. (Patient taking differently: Take 160 mg by mouth daily. ) 180 tablet 2  . gabapentin (NEURONTIN) 300 MG capsule Take 300 mg by mouth at bedtime as needed (pain).   0  . lisinopril (PRINIVIL,ZESTRIL) 10 MG tablet TAKE TWO TABLETS BY MOUTH ONCE DAILY 90 tablet 3  . mometasone-formoterol (DULERA) 200-5 MCG/ACT AERO Inhale 2 puffs into the lungs 2 (two) times daily.    Marland Kitchen Spacer/Aero-Holding Chambers (E-Z SPACER) inhaler Use as instructed 1 each 2  . spironolactone (ALDACTONE) 25 MG tablet Take 1 tablet (25 mg total) by mouth daily. 90 tablet 3  . tiotropium (SPIRIVA) 18 MCG inhalation capsule Place 1 capsule (18 mcg total) into inhaler and inhale daily. 30 capsule 1  . triamcinolone cream (KENALOG) 0.1 % Apply 1 application topically 3 (three) times daily as needed (eczema on arms). 30 g 1  .  hydrocortisone 1 % ointment Apply 1 application topically 2 (two) times daily as needed (eczema on face). 30 g 1  . ipratropium (ATROVENT) 0.06 % nasal spray Place 2 sprays into both nostrils 4 (four) times daily. 15 mL 0  . meloxicam (MOBIC) 15 MG tablet Take 15 mg by mouth daily as needed for pain.   0  . metFORMIN (GLUCOPHAGE) 1000 MG tablet Take 1 tablet (1,000 mg total) by mouth 2 (two) times daily with a meal. (Patient not taking: Reported on 10/22/2016) 180 tablet 3  . tamsulosin (FLOMAX) 0.4 MG CAPS capsule Take 1 capsule (0.4 mg total) by mouth daily. (Patient not taking: Reported on 10/22/2016) 30 capsule 0  . tiZANidine (ZANAFLEX) 2 MG tablet Take 1 tablet by mouth at bedtime as needed for muscle spasms.   0  . trimethoprim-polymyxin b (POLYTRIM) ophthalmic solution Place 1 drop into both eyes every 4 (four) hours. (Patient not taking: Reported on 10/22/2016) 10 mL 0  . [DISCONTINUED] loratadine (CLARITIN) 10 MG tablet Take 1 tablet (10 mg total) by mouth daily. (Patient not taking: Reported on 06/14/2015) 30 tablet 11  . [DISCONTINUED] nortriptyline (PAMELOR) 25 MG capsule Take 1 capsule (25 mg total) by mouth at bedtime. Take 1 capsule (25 mg total) by mouth at bedtime for one week then increase to 2 capsules (50 mg total) by mouth at bedtime. (Patient taking differently: Take 50 mg by mouth at bedtime. Take 1 capsule (25 mg total) by mouth at bedtime for one week then increase to 2 capsules (50 mg total) by mouth at bedtime.) 53 capsule 0  . [DISCONTINUED] omeprazole (PRILOSEC) 20 MG capsule Take 1 capsule (20 mg total) by mouth daily. (Patient not taking: Reported on 07/05/2015) 30 capsule 0  . [DISCONTINUED] sertraline (ZOLOFT) 50 MG tablet Take 1 tablet (50 mg total) by mouth daily. (Patient not taking: Reported on 06/14/2015) 30 tablet 3    Objective: BP (!) 145/93 (BP Location: Right Arm)   Pulse 71   Temp 98.3 F (36.8 C) (Oral)   Resp 18   Ht 5' 4"  (1.626 m)  Wt (!) 142 kg (313 lb  1.6 oz)   LMP 08/31/2015   SpO2 97%   BMI 53.74 kg/m  Exam: General: Laying in bed in no apparent distress; able to talk with ease.  Eyes: The sclera are anicteric and there is no pallor.  ENTM: The mucous membranes are moist.  Neck: There is some mild acanthosis nigricans of the back of the neck. There is no JVD and no hepatojugular reflux is noted. There are no carotid bruits. Cardiovascular: Radial pulses are 2+ bilaterally and regular. The heart has a regular rate and rhythm without any murmurs, rubs or gallops appreciated.  Respiratory: She is able to breath comfortably without any supplemental oxygen or the use of any accessory muscles or tripod posturing. The lungs have mild basilar crackles bilaterally. No Wheeze Gastrointestinal: The abdomen is large but non-tender and non-distended.  MSK: She is able to move herself up in bed and moves the extremities without any difficulties or asymmetries. There is trace pitting edema of the bilateral lower extremities to the level of the mid-shin with more prominence noted in the right lower extremity than the left. There is no calf tenderness, erythema or warmth of the lower extremities.  Derm: There are no obvious rashes noted to the face, upper or lower extremities.  Neuro: She is alert and oriented to person, time and place and is appropriate neurologically with conversation in regards to thought process and concentration.  Psych: Overall, she appears well but when talking about being at home and how she is unable to work outside of the home anymore she does appear sad.  Labs and Imaging: CBC BMET   Recent Labs Lab 10/22/16 1617  WBC 11.8*  HGB 14.9  HCT 45.0  PLT 198    Recent Labs Lab 10/22/16 1135 10/22/16 1617  NA 139  --   K 4.5  --   CL 103  --   CO2 28  --   BUN 15  --   CREATININE 1.04* 0.99  GLUCOSE 149*  --   CALCIUM 9.3  --      Jacklynn Ganong, MS4 10/22/2016, 7:26 PM [facility] pager: [phone], text pages  welcome   I have seen and evaluated the patient with an AI Jon. I am in agreement with the note above in its revised form as needed.  Wendee Beavers, MD.  PGY-2, [facility] Pager [phone] 10/22/16  7:27 PM

## 2016-10-23 ENCOUNTER — Encounter (HOSPITAL_COMMUNITY): Payer: Self-pay | Admitting: Student

## 2016-10-23 ENCOUNTER — Inpatient Hospital Stay (HOSPITAL_COMMUNITY): Payer: Medicare HMO

## 2016-10-23 DIAGNOSIS — E662 Morbid (severe) obesity with alveolar hypoventilation: Secondary | ICD-10-CM | POA: Diagnosis present

## 2016-10-23 DIAGNOSIS — Z79899 Other long term (current) drug therapy: Secondary | ICD-10-CM | POA: Diagnosis not present

## 2016-10-23 DIAGNOSIS — I5033 Acute on chronic diastolic (congestive) heart failure: Secondary | ICD-10-CM | POA: Insufficient documentation

## 2016-10-23 DIAGNOSIS — Z6841 Body Mass Index (BMI) 40.0 and over, adult: Secondary | ICD-10-CM | POA: Diagnosis not present

## 2016-10-23 DIAGNOSIS — E1165 Type 2 diabetes mellitus with hyperglycemia: Secondary | ICD-10-CM | POA: Diagnosis present

## 2016-10-23 DIAGNOSIS — M545 Low back pain: Secondary | ICD-10-CM | POA: Diagnosis present

## 2016-10-23 DIAGNOSIS — L83 Acanthosis nigricans: Secondary | ICD-10-CM | POA: Diagnosis present

## 2016-10-23 DIAGNOSIS — J441 Chronic obstructive pulmonary disease with (acute) exacerbation: Secondary | ICD-10-CM | POA: Diagnosis present

## 2016-10-23 DIAGNOSIS — Z825 Family history of asthma and other chronic lower respiratory diseases: Secondary | ICD-10-CM | POA: Diagnosis not present

## 2016-10-23 DIAGNOSIS — Z7951 Long term (current) use of inhaled steroids: Secondary | ICD-10-CM | POA: Diagnosis not present

## 2016-10-23 DIAGNOSIS — I455 Other specified heart block: Secondary | ICD-10-CM

## 2016-10-23 DIAGNOSIS — R0600 Dyspnea, unspecified: Secondary | ICD-10-CM

## 2016-10-23 DIAGNOSIS — I10 Essential (primary) hypertension: Secondary | ICD-10-CM

## 2016-10-23 DIAGNOSIS — R001 Bradycardia, unspecified: Secondary | ICD-10-CM | POA: Diagnosis present

## 2016-10-23 DIAGNOSIS — Z7982 Long term (current) use of aspirin: Secondary | ICD-10-CM | POA: Diagnosis not present

## 2016-10-23 DIAGNOSIS — E7800 Pure hypercholesterolemia, unspecified: Secondary | ICD-10-CM | POA: Diagnosis not present

## 2016-10-23 DIAGNOSIS — R079 Chest pain, unspecified: Secondary | ICD-10-CM | POA: Diagnosis present

## 2016-10-23 DIAGNOSIS — F329 Major depressive disorder, single episode, unspecified: Secondary | ICD-10-CM | POA: Diagnosis present

## 2016-10-23 DIAGNOSIS — Z8673 Personal history of transient ischemic attack (TIA), and cerebral infarction without residual deficits: Secondary | ICD-10-CM | POA: Diagnosis not present

## 2016-10-23 DIAGNOSIS — I110 Hypertensive heart disease with heart failure: Secondary | ICD-10-CM | POA: Diagnosis present

## 2016-10-23 DIAGNOSIS — R600 Localized edema: Secondary | ICD-10-CM | POA: Diagnosis not present

## 2016-10-23 DIAGNOSIS — E119 Type 2 diabetes mellitus without complications: Secondary | ICD-10-CM | POA: Diagnosis not present

## 2016-10-23 DIAGNOSIS — E875 Hyperkalemia: Secondary | ICD-10-CM | POA: Diagnosis not present

## 2016-10-23 DIAGNOSIS — K219 Gastro-esophageal reflux disease without esophagitis: Secondary | ICD-10-CM | POA: Diagnosis present

## 2016-10-23 DIAGNOSIS — Z8249 Family history of ischemic heart disease and other diseases of the circulatory system: Secondary | ICD-10-CM | POA: Diagnosis not present

## 2016-10-23 DIAGNOSIS — Z7984 Long term (current) use of oral hypoglycemic drugs: Secondary | ICD-10-CM | POA: Diagnosis not present

## 2016-10-23 DIAGNOSIS — F17210 Nicotine dependence, cigarettes, uncomplicated: Secondary | ICD-10-CM | POA: Diagnosis present

## 2016-10-23 DIAGNOSIS — E785 Hyperlipidemia, unspecified: Secondary | ICD-10-CM | POA: Diagnosis present

## 2016-10-23 LAB — ECHOCARDIOGRAM COMPLETE
AV Area VTI index: 0.72 cm2/m2
AV Area VTI: 1.81 cm2
AV Area mean vel: 1.59 cm2
AV Peak grad: 11 mmHg
AV VEL mean LVOT/AV: 0.7
AV peak Index: 0.76
AV pk vel: 164 cm/s
AVAREAMEANVIN: 0.67 cm2/m2
AVG: 6 mmHg
Ao pk vel: 0.8 m/s
CHL CUP AV VEL: 1.71
DOP CAL AO MEAN VELOCITY: 115 cm/s
E decel time: 313 msec
E/e' ratio: 18.39
FS: 38 % (ref 28–44)
HEIGHTINCHES: 64 in
IVS/LV PW RATIO, ED: 1.2
LA ID, A-P, ES: 47 mm
LA diam end sys: 47 mm
LA diam index: 1.98 cm/m2
LA vol A4C: 32 ml
LAVOL: 37.1 mL
LAVOLIN: 15.7 mL/m2
LDCA: 2.27 cm2
LV E/e'average: 18.39
LV PW d: 12.5 mm — AB (ref 0.6–1.1)
LV e' LATERAL: 6.09 cm/s
LVEEMED: 18.39
LVOT SV: 63 mL
LVOT VTI: 27.9 cm
LVOT peak grad rest: 7 mmHg
LVOTD: 17 mm
LVOTPV: 131 cm/s
LVOTVTI: 0.75 cm
MV Dec: 313
MV Peak grad: 5 mmHg
MVPKAVEL: 115 m/s
MVPKEVEL: 112 m/s
RV LATERAL S' VELOCITY: 15 cm/s
RV TAPSE: 24.9 mm
TDI e' lateral: 6.09
TDI e' medial: 5.66
VTI: 37.1 cm
Valve area index: 0.72
Valve area: 1.71 cm2
WEIGHTICAEL: 5008 [oz_av]

## 2016-10-23 LAB — CBC
HEMATOCRIT: 45 % (ref 36.0–46.0)
Hemoglobin: 14.7 g/dL (ref 12.0–15.0)
MCH: 28.3 pg (ref 26.0–34.0)
MCHC: 32.7 g/dL (ref 30.0–36.0)
MCV: 86.7 fL (ref 78.0–100.0)
PLATELETS: 195 10*3/uL (ref 150–400)
RBC: 5.19 MIL/uL — AB (ref 3.87–5.11)
RDW: 15.4 % (ref 11.5–15.5)
WBC: 11.5 10*3/uL — ABNORMAL HIGH (ref 4.0–10.5)

## 2016-10-23 LAB — GLUCOSE, CAPILLARY
GLUCOSE-CAPILLARY: 169 mg/dL — AB (ref 65–99)
GLUCOSE-CAPILLARY: 261 mg/dL — AB (ref 65–99)
Glucose-Capillary: 170 mg/dL — ABNORMAL HIGH (ref 65–99)
Glucose-Capillary: 203 mg/dL — ABNORMAL HIGH (ref 65–99)

## 2016-10-23 LAB — BASIC METABOLIC PANEL
Anion gap: 7 (ref 5–15)
BUN: 19 mg/dL (ref 6–20)
CO2: 28 mmol/L (ref 22–32)
CREATININE: 1.06 mg/dL — AB (ref 0.44–1.00)
Calcium: 9.1 mg/dL (ref 8.9–10.3)
Chloride: 102 mmol/L (ref 101–111)
GFR calc Af Amer: >60 mL/min (ref >60)
GFR calc non Af Amer: 59 mL/min — ABNORMAL LOW (ref >60)
Glucose, Bld: 152 mg/dL — ABNORMAL HIGH (ref 65–99)
POTASSIUM: 3.7 mmol/L (ref 3.5–5.1)
Sodium: 137 mmol/L (ref 135–145)

## 2016-10-23 LAB — HEMOGLOBIN A1C
Hgb A1c MFr Bld: 7.3 % — ABNORMAL HIGH (ref 4.8–5.6)
MEAN PLASMA GLUCOSE: 163 mg/dL

## 2016-10-23 LAB — TROPONIN I: Troponin I: <0.03 ng/mL (ref <0.03)

## 2016-10-23 MED ORDER — AMLODIPINE BESYLATE 5 MG PO TABS
5.0000 mg | ORAL_TABLET | Freq: Every day | ORAL | Status: DC
  Administered 2016-10-23 – 2016-10-27 (×5): 5 mg via ORAL
  Filled 2016-10-23 (×5): qty 1

## 2016-10-23 MED ORDER — LISINOPRIL 40 MG PO TABS
40.0000 mg | ORAL_TABLET | Freq: Every day | ORAL | Status: DC
  Administered 2016-10-24 – 2016-10-25 (×2): 40 mg via ORAL
  Filled 2016-10-23 (×2): qty 1

## 2016-10-23 MED ORDER — TORSEMIDE 20 MG PO TABS: 60.0000 mg | ORAL_TABLET | Freq: Two times a day (BID) | ORAL | Status: DC

## 2016-10-23 MED ORDER — FUROSEMIDE 10 MG/ML IJ SOLN
80.0000 mg | Freq: Two times a day (BID) | INTRAMUSCULAR | Status: DC
  Administered 2016-10-23 – 2016-10-24 (×2): 80 mg via INTRAVENOUS
  Filled 2016-10-23 (×2): qty 8

## 2016-10-23 MED ORDER — IPRATROPIUM-ALBUTEROL 0.5-2.5 (3) MG/3ML IN SOLN
3.0000 mL | Freq: Two times a day (BID) | RESPIRATORY_TRACT | Status: DC
  Administered 2016-10-23: 3 mL via RESPIRATORY_TRACT
  Filled 2016-10-23 (×2): qty 3

## 2016-10-23 NOTE — Care Management Obs Status (Signed)
[facility] NOTIFICATION   Patient Details  Name: Hailey Barnes MRN: [id number] Date of Birth: 02-28-1961   Medicare Observation Status Notification Given:  Yes    Dawayne Patricia, RN 10/23/2016, 12:01 PM

## 2016-10-23 NOTE — Progress Notes (Signed)
Patient had multiple pauses and bradycardia (30's-40's). MD aware. Will continue to monitor

## 2016-10-23 NOTE — Progress Notes (Signed)
SATURATION QUALIFICATIONS: (This note is used to comply with regulatory documentation for home oxygen)  Patient Saturations on Room Air at Rest = 98%  Patient Saturations on Room Air while Ambulating =94%

## 2016-10-23 NOTE — Consult Note (Signed)
Cardiology Consult    Patient ID: Hailey Barnes MRN: [id number], DOB/AGE: 1961/08/09   Admit date: 10/22/2016 Date of Consult: 10/23/2016  Primary Physician: Dimas Chyle, MD Reason for Consult: Chest Pain/ CHF Primary Cardiologist: Dr. Marlou Porch Requesting Provider: Dr. Gwendlyn Deutscher   History of Present Illness    Hailey Barnes is a 56 y.o. female with past medical history of chronic diastolic CHF (EF 16-38% by echo in 05/2014), COPD, HTN, HLD, Type 2 DM, prior CVA, tobacco use, and history of substance use who presented to [facility] ED from the [place] office on 10/22/2016 fpr evaluation of chest discomfort and worsening dyspnea.   Reports her symptoms began two weeks prior to presentation. Started to developed worsening lower extremity edema and abdominal distension. She self-increased her Lasix to 160 mg BID but ran out of her Lasix and her symptoms had not improved. Also began to experience worsening orthopnea and dyspnea with exertion.   She has also reported episodes of chest discomfort. This improves when she massages her chest. This worsened with her volume overloaded, as she felt she could not take a deep breath. With fluid removal, her chest discomfort has resolved and she denies any repeat episodes.   At her office visit, weight was noted to be 319 lbs, up from 300 lbs in 08/2016.  Initial labs showed a WBC of 13.3, Hgb 15.1, and platelets 213. K+ 4.5. Creatinine 1.04. BNP 24.4. Hgb A1c 7.3. Cyclic troponin values have been negative. CXR with no evidence of active pulmonary disease. EKG showed NSR, HR 69, with no acute ST or T-wave changes.   While on telemetry, she was noted to have frequent pauses (max of 4.4 seconds) and bradycardia with HR in the high-30's to 40's, therefore her Coreg was discontinued. She has sinus maintained NSR with HR in the 60's - 70's.   She was started on Lasix 91m with no recorded net output, but weight is recorded as having declined 6  lbs to 313 lbs. He has been switched to Torsemide 653mBID as it was thought she was not responding to the IV Lasix.   Was last seen in the office by Dr. SkMarlou Porchn 03/2016 for preoperative clearance. Reported doing well from a cardiac perspective at that time. Had experienced a recent COPD exacerbation which was being treated by her PCP. Was continued on ASA 8148maily, Lipitor 53m67mily, Coreg 25mg62m, Lasix 120mg 19m Lisinopril 20mg d41m, and Spironolactone 25mg da54m   Past Medical History   Past Medical History:  Diagnosis Date  . Arthritis    "knees" (02/21/2014), back  . CHF (congestive heart failure) (HCC)   .[place]ronic bronchitis (HCC)    Od[place] it q yr" (02/21/2014)  . Chronic diastolic CHF (congestive heart failure) (HCC)    [place] EF 65-70% with Grade 1 DD.  . ChroniMarland Kitchen lower back pain   . Complication of anesthesia    "I don't come out well; I chew on my tongue"  . COPD (chronic obstructive pulmonary disease) (HCC)   .[place] without complication (HCC)   .[place]   . GERD (gastroesophageal reflux disease)   . Heart murmur   . Hypertension   . Infection of right eye    current dx on Saturday 7/15 - using drops  . Migraine 1982-2009  . MVP (mitral valve prolapse)   . Neuromuscular disorder (HCC)    [place] leg nerve pain  . Shortness of breath   .  Sinus pause    a. noted on telemetry during admission from 10/2016, lasting up to 4.4 seconds. BB discontinued.   . Sleep apnea 02/2016   recent hospitalization noted apnea during stay no sleep studt performed since this episode  . Stroke [facility]) noted on CAT 02/2014   "light", LLE weakness remains (03/30/2014)  . 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 Tingling in extremities 12/12/2010   Uric acid and electrolytes (02/23) normal but WBC elevated.   D/Dx: carpal tunnel, ulnar neuropathy Less likely: cervical radiculopathy, vasculitis     Past Surgical History:    Procedure Laterality Date  . [place]  . DILATATION & CURETTAGE/HYSTEROSCOPY WITH MYOSURE N/A 05/06/2016   Procedure: DILATATION & CURETTAGE/HYSTEROSCOPY WITH MYOSURE;  Surgeon: Terrance Mass, MD;  Location: [facility];  Service: Gynecology;  Laterality: N/A;  request to follow around 8:45  requests one hour OR time  . DILATION AND CURETTAGE OF UTERUS    . FOOT SURGERY Bilateral    "took bones out; put pins in"  . LEFT AND RIGHT HEART CATHETERIZATION WITH CORONARY ANGIOGRAM N/A 03/31/2014   Procedure: LEFT AND RIGHT HEART CATHETERIZATION WITH CORONARY ANGIOGRAM;  Surgeon: Birdie Riddle, MD;  Location: [facility];  Service: Cardiovascular;  Laterality: N/A;  . MYOMECTOMY       Allergies  No Known Allergies  Inpatient Medications    . aspirin EC  81 mg Oral Daily  . atorvastatin  40 mg Oral q1800  . docusate sodium  100 mg Oral BID  . enoxaparin (LOVENOX) injection  70 mg Subcutaneous Q24H  . insulin aspart  0-9 Units Subcutaneous TID WC  . ipratropium-albuterol  3 mL Nebulization BID  . lisinopril  20 mg Oral Daily  . mometasone-formoterol  2 puff Inhalation BID  . nicotine  21 mg Transdermal Q24H  . sodium chloride flush  3 mL Intravenous Q12H  . spironolactone  25 mg Oral Daily  . torsemide  60 mg Oral BID    Family History    Family History  Problem Relation Age of Onset  . Hypertension Mother   . COPD Mother   . Hypertension Father   . Cancer Sister     UTERINE???  . COPD Sister     Social History    Social History   Social History  . Marital status: Single    Spouse name: N/A  . Number of children: N/A  . Years of education: N/A   Occupational History  . Not on file.   Social History Main Topics  . Smoking status: Current Every Day Smoker    Packs/day: 1.00    Years: 37.00    Types: Cigarettes  . Smokeless tobacco: Never Used     Comment: Would like to quit  . Alcohol use No  . Drug use: No     Comment: 03/30/2014 "stopped using  crack 02/21/2014"  . Sexual activity: Yes    Birth control/ protection: Post-menopausal   Other Topics Concern  . Not on file   Social History Narrative   Married 11/16/2009 to Jim Thorpe whom she met at Rehab.    Enjoys church at [employer], [place] geared towards people with substance abuse history.       Financial assistance approved for 100% discount at [facility] and [facility]   June 27, 2010 11:36 am     Review of Systems  General:  No chills, fever, night sweats or weight changes.  Cardiovascular:  No palpitations or paroxysmal nocturnal dyspnea.  Positive for chest pain, dyspnea on exertion, edema, and orthopnea.  Dermatological: No rash, lesions/masses Respiratory: No cough, Positive for dyspnea Urologic: No hematuria, dysuria Abdominal:   No nausea, vomiting, diarrhea, bright red blood per rectum, melena, or hematemesis Neurologic:  No visual changes, wkns, changes in mental status. All other systems reviewed and are otherwise negative except as noted above.  Physical Exam    Blood pressure (!) 151/80, pulse (!) 150, temperature 98.4 F (36.9 C), temperature source Oral, resp. rate 18, height _0  (1.626 m), weight (!) 313 lb (142 kg), last menstrual period 08/31/2015, SpO2 94 %.  General: Pleasant, obese African American female appearing in NAD. Psych: Normal affect. Neuro: Alert and oriented X 3. Moves all extremities spontaneously. HEENT: Normal  Neck: Supple without bruits. JVD difficult to assess secondary to body habitus. Lungs:  Resp regular and unlabored, decreased breath sounds at bases bilaterally. Heart: RRR no s3, s4, or murmurs. Abdomen: Soft, non-tender, non-distended, BS + x 4.  Extremities: No clubbing or cyanosis. 2+ pitting edema up to knees bilaterally. DP/PT/Radials 2+ and equal bilaterally.  Labs    Troponin [facility] Test)  Recent Labs  10/22/16 1144  TROPIPOC 0.01    Recent Labs  10/22/16 1617 10/22/16 2109  10/23/16 0333  TROPONINI <0.03 <0.03 <0.03   Lab Results  Component Value Date   WBC 11.5 (H) 10/23/2016   HGB 14.7 10/23/2016   HCT 45.0 10/23/2016   MCV 86.7 10/23/2016   PLT 195 10/23/2016     Recent Labs Lab 10/23/16 0333  NA 137  K 3.7  CL 102  CO2 28  BUN 19  CREATININE 1.06*  CALCIUM 9.1  GLUCOSE 152*   Lab Results  Component Value Date   CHOL 136 04/17/2016   HDL 37 (L) 04/17/2016   LDLCALC 69 04/17/2016   TRIG 150 (H) 04/17/2016   Lab Results  Component Value Date   DDIMER 0.73 (H) 06/14/2015     Radiology Studies    Dg Chest 2 View  Result Date: 10/22/2016 CLINICAL DATA:  Move Central chest tightness, shortness of breath EXAM: CHEST  2 VIEW COMPARISON:  01/21/2016 FINDINGS: Heart and mediastinal contours are within normal limits. No focal opacities or effusions. No acute bony abnormality. IMPRESSION: No active cardiopulmonary disease. Electronically Signed   By: Rolm Baptise M.D.   On: 10/22/2016 11:16   Dg Inject Diag/thera/inc Needle/cath/plc Epi/lumb/sac W/img  Result Date: 10/01/2016 CLINICAL DATA:  Lumbosacral spondylosis without myelopathy. Low back pain and right greater than left lower extremity pain. Good response to the prior injection, though pain has returned. FLUOROSCOPY TIME:  Radiation Exposure Index (as provided by the fluoroscopic device): 126.62 microGray*m^2 Fluoroscopy Time (in minutes and seconds):  15 seconds PROCEDURE: The procedure, risks, benefits, and alternatives were explained to the patient. Questions regarding the procedure were encouraged and answered. The patient understands and consents to the procedure. LUMBAR EPIDURAL INJECTION: An interlaminar approach was performed on the right at L4-5. The overlying skin was cleansed and anesthetized. A 6 inch 20 gauge epidural needle was advanced using loss-of-resistance technique. DIAGNOSTIC EPIDURAL INJECTION: Injection of Isovue-M 200 shows a good epidural pattern with spread above and  below the level of needle placement, primarily on the right. No vascular opacification is seen. THERAPEUTIC EPIDURAL INJECTION: 120 mg of Depo-Medrol mixed with 3 mL of 1% lidocaine were instilled. The procedure  was well-tolerated, and the patient was discharged thirty minutes following the injection in good condition. COMPLICATIONS: None IMPRESSION: Technically successful lumbar interlaminar epidural injection on the right at L4-5. Electronically Signed   By: Logan Bores M.D.   On: 10/01/2016 13:49    EKG & Cardiac Imaging    EKG:  NSR, HR 69, with no acute ST or T-wave changes.   Echocardiogram: 05/2014 Study Conclusions  - Left ventricle: The cavity size was normal. Wall thickness was increased in a pattern of mild LVH. Systolic function was vigorous. The estimated ejection fraction was in the range of 65% to 70%. Doppler parameters are consistent with abnormal left ventricular relaxation (grade 1 diastolic dysfunction). - Left atrium: The atrium was moderately dilated. - Pericardium, extracardiac: A trivial pericardial effusion was identified.  Assessment & Plan    1. Acute on Chronic Diastolic CHF - EF 36-64% by echo in 05/2014. Was seen by her PCP the day of admission for worsening dyspnea with exertion, orthopnea, lower extremity edema, and abdominal distension. She self-increased her PO Lasix from 183m BID to 160 mg BID with no improvement in her symptoms.  - weight at time of admission was noted to be 319 lbs, up from 300 lbs in 08/2016. She was started on IV Lasix and it was thought she had minimal response to this as no significant output was recorded but she reports having 4+ episodes of urination following her dose yesterday and weight is down 6 lbs. Would increase IV Lasix to 8109mBID. PO Torsemide would be ideal as an outpatient for improved bioavailability.   2. Atypical Chest Pain - reports episodes of chest discomfort occurring in the past 2 weeks. Pain  improves when she massages her chest. Pain worsened when taking a deep breath.  - cyclic troponin values have been negative and EKG shows NSR, HR 69, with no acute ST or T-wave changes.  - overall her pain seems atypical for an ischemic etiology. Echo pending to reassess EF and wall motion. Would not pursue further ischemic evaluation in the setting of her atypical symptoms unless EF significantly reduced.   3. Sinus Pauses/ Bradycardia - overnight she had frequent pauses (max of 4.4 seconds) and bradycardia with HR in the high-30's to 40's.  - BB discontinued with telemetry showing NSR with HR in the 60's - 70's. Continue to monitor on telemetry while admitted.   4. HTN - BP has been variable at 125/69 - 171/111 in the past 24 hours.  - continue Lisinopril 2021maily and Spironolactone 24m42mily. Coreg stopped in the setting of bradycardia and sinus pauses. Will increase Lisinopril to 40mg79mly.    5. HLD - Lipid Panel in 03/2016 showed LDL of 69. - continue statin therapy.   6. Tobacco Use - cessation advised.    Signed, BrittErma HeritageC 10/23/2016, 1:30 PM Pager: [phone] seen and examined. Agree with above.  55 ye56 old female with morbid obesity, chronic diastolic heart failure, smoker, hypertension, hyperlipidemia, sinus pauses/bradycardia noted.   - We will go ahead and place her back on IV Lasix 80 mg twice daily and take this opportunity since she is here in the hospital for aggressive diuresis. We will continue to monitor her creatinine. It is possible that she is holding onto liters of fluid.  - Starting amlodipine 5 mg to assist with blood pressures well. This should also decrease as her fluid status improves.  - Sinus pause in the middle the night is likely related to  obstructive sleep apnea, obesity hypoventilation syndrome, my assumption. I agree with holding the carvedilol 25 mg twice a day for now. Consider sleep study as outpatient if not  already done. We may still be able to get away with some degree of beta blocker to assist with blood pressure.  She may likely need to be here over the next couple of days.  Candee Furbish, MD

## 2016-10-23 NOTE — Progress Notes (Signed)
Family Medicine Teaching Service Daily Progress Note Intern Pager: [phone]  Patient name: Hailey Barnes Medical record number: [id number] Date of birth: 1961/01/03 Age: 56 y.o. Gender: female  Primary Care Provider: Dimas Chyle, MD Consultants: Cardiology Code Status: Full  Pt Overview and Major Events to Date:  Last night she had a 4 second episode of skipped beats and she was taken off of carvedilol per cardiology's recommendation.   Assessment and Plan: Hailey Barnes is a 56 y.o. female presenting with shortness of breath and lower extremity swelling. PMH is significant for CHF, COPD, hypertension, diabetes, CVA and sleep apnea.   Dyspnea: improved. Likely CHF exacerbation. Chest tightness resolved. The intermittent nature of her symptoms suggestive for OSA and OHS. ACS ruled out by EKG and serial troponin.  - Discontinue IV Lasix.  - Changing lasix to torsemide 60 mg PO BID, equivalent of her home Lasix - Get strict I&Os and daily weights.  - Continuous .  - Obtain transthoracic echocardiography.  - Oxygen PRN.  - Follow cardiac recs - A.m. BMP - Continue telemetry  Intermittent sinus arrest: Happened at night while asleep. Symptomatic with dyspnea and chest tightness. Likely due to OSA/OHS. Denies any symptoms with CPAP on. Carvedilol discontinued per cardiology recommendation - Follow up cardiology recommendation - Will get echo.    HFpEF:  Echo in 05/2014 showed EF of 76-81%, G1 diastolic dysfunction and moderate left atrial enlargement. On coreg,  lisinopril 20 mg PO QD, spironolactone 25 PO mg QD and Lasix 120 mg PO BID at home. All for HFrEF except the latter.  - Carvedilol discontinued per cardiology recommendation - We'll continue her other medications. May need revision based on BP and echo - Echocardiogram.   COPD:  Stable. on [place], Spiriva and albuterol at home. Not taking the Spiriva at home. Lung exam without increased work of breathing or wheezing.   -Continue home Dulera -Schedule DuoNeb every 6 hours -Hold Spiriva  Hypertension:  normotensive - Continue home meds as described above. - Changing lasix to torsemide 60 mg PO BID.   Diabetes: Most recent POCT Hgb A1c was 7.3 on 07/30/16.   - Hold metformin 1000 mg PO BID while in the hospital, but plan to restart once discharged.  - SSI-thin.  Sleep Apnea:  did well with CPAP on overnight. Had a sleep study 2 years ago that this remarkable for OSA. - CPAP at night here - Need sleep study outpatient  Chronic Back Pain:  on meloxicam at home. Holding call meloxicam - Continue gabapentin 300 mg PO at night PRN, both for pain. Has good kidney function  Depression: Stable. Euthymic mood. Not on any medication - Will continue to monitor.  FEN/GI: Heart healthy and carb modified diet  PPx: Lovenox for VTE  Disposition:  transitioned from observation to inpatient status for further evaluation and treatment of CHF and sinus arrest  Subjective:  Overnight she had an episode of shortness of breath and chest pain that coincided with pauses in the CV strip and bradycardia to the 30s. She was discussed with cardiology who recommended discontinuation of her beta blocker which was completed. During that episode of bradycardia she was not wearing the CPAP machine.   This morning she states that she does feel better in regards to her chest tightness as this has resolved, but her shortness of breath and lower extremity have only slightly improved. Of note, she was placed on the CPAP last night and she says that she feels much more rested this morning  than she typically does and was able to sleep for a longer period of time than she is able to at home where she does not have CPAP.   Objective: Temp:  [98 F (36.7 C)-99 F (37.2 C)] 98.4 F (36.9 C) (01/04 0652) Pulse Rate:  [44-87] 71 (01/04 0652) Resp:  [12-21] 18 (01/04 0652) BP: (128-180)/(69-111) 129/82 (01/04 0652) SpO2:  [93 %-100  %] 94 % (01/04 0727) Weight:  [142 kg (313 lb)-144.7 kg (319 lb)] 142 kg (313 lb) (01/04 3568) Physical Exam: General: Well-appearing sitting in the chair getting herself ready for the day.  Cardiovascular: Heart has a regular rate and rhythm without any murmurs, rubs or gallops.  Respiratory: On 2 L by nasal cannula, no increased work of breathing,  lungs clear to auscultation bilaterally Abdomen: The abdomen is large but non-distended and non-tender.  Extremities: 1+ pitting edema on right, trace pitting edema on left, no calf tenderness or palpable cord  Laboratory:  Recent Labs Lab 10/22/16 1135 10/22/16 1617 10/23/16 0333  WBC 13.3* 11.8* 11.5*  HGB 15.1* 14.9 14.7  HCT 45.3 45.0 45.0  PLT 213 198 195    Recent Labs Lab 10/22/16 1135 10/22/16 1617 10/23/16 0333  NA 139  --  137  K 4.5  --  3.7  CL 103  --  102  CO2 28  --  28  BUN 15  --  19  CREATININE 1.04* 0.99 1.06*  CALCIUM 9.3  --  9.1  GLUCOSE 149*  --  152*   Imaging/Diagnostic Tests: No results found.  Jacklynn Ganong, MS4 10/23/2016, 8:57 AM [place] Intern pager: [phone], text pages welcome  I have seen and evaluated the patient with an AI, Jon. I am in agreement with the note above in its revised form.   Wendee Beavers, MD, PGY-2 10/23/2016 1:48 PM

## 2016-10-23 NOTE — Discharge Summary (Signed)
[facility] Discharge Summary  Patient name: Hailey Barnes Medical record number: [id number] Date of birth: 1961/02/12 Age: 56 y.o. Gender: female Date of Admission: 10/22/2016  Date of Discharge: 10/27/16 Admitting Physician: Kinnie Feil, MD  Primary Care Provider: Dimas Chyle, MD Consultants: Cardiology  Indication for Hospitalization: Shortness of breath and chest pain with lower extremity edema  Discharge Diagnoses/Problem List:  - Hypertension - Hyperlipidemia - Diabetes - Lower extremity edema - Diastolic heart failure - COPD - Tobacco abuse - Obesity  Disposition: Discharge to home  Discharge Condition: Stable  Discharge Exam:   10/27/16 0513  BP: 116/85  Pulse: 87  Resp: 18  Temp: 97.8 F (36.6 C)   GEN: appears well, sitting in chair washing her face, no apparent distress. CVS: RRR, nl s1 & s2, no murmurs, trace edema in RLE RESP: no increased work of breathing, good air movement bilaterally, no rhonchi, crackles or wheeze GI: Obese, bowel sounds present and normal, soft, non-tender, non-distended MSK: Trace edema in right lower extremity. No calf tenderness or erythema SKIN: no apparent skin lesion NEURO: alert and oiented appropriately, no gross defecits  PSYCH: euthymic mood with congruent affect  Brief Hospital Course:  Hailey Barnes is a 56 y/o female with PMH significant for diastolic HF, COPD, hypertension, diabetes, CVA and sleep apnea who presented on 10/22/16 with gradual worsening of shortness of breath, lower extremity edema and chest pain likely due to CHF exacerbation.   CHF exacerbation: patient with 19 lbs weight gain (300lbs to 319 lbs) in 6 weeks  ACS was ruled out by serial trop and EKG. She had 1+ edema in right leg and trace in left leg but no sign of DVT, which was also ruled out by a duplex ultrasound. Echo on 10/23/2016 with EF 75%, severe LVH & G1DD. Cardiology was consulted, and she was aggressive  diuresed by IV lasix 80 mg three times a day with resolution of her symptoms. Weight down to 306 lbs on the day of discharge. She was ambulated maintained oxygen saturation over 94%. She was discharged on torsemide 60 mg twice a day, an equivalent of her home lasix.   Sinus pause: noted on telemetry the day of admission likely due to OSA/OHS. She was symptomatic with dyspnea and chest tightness during those pauses. Cardiology was consulted and dscontinued coreg. With this, sinus pauses and symptoms resolved. However, her HR went up to 90's prior to discharge. She was started on Pindolol by cardiology and discharged on this.   OSA/OHS:Last sleep study in 2015 with sleep apnea. She could not tolerate CPAP this admission. Recommend referral to sleep apnea clinic.  - Needs sleep study outpatient.   Issues for Follow Up:  1. HFpHF: f/u on respiratory and fluid status. Ensure that she is taking torsemide. Repeat BMP. Make sure that follow with Card is set.  2. Sinus Pause: likely due to OSA/OHS. Need referral to sleep clinic for sleep study and CPAP titration 3. Medication changes (see below): recommend reviewing her medications and make sure that she is taking the right ones.   Significant Procedures: None  Significant Labs and Imaging:   Recent Labs Lab 10/22/16 1135 10/22/16 1617 10/23/16 0333  WBC 13.3* 11.8* 11.5*  HGB 15.1* 14.9 14.7  HCT 45.3 45.0 45.0  PLT 213 198 195    Recent Labs Lab 10/23/16 0333 10/24/16 0209 10/25/16 0246 10/26/16 0341 10/27/16 0316  NA 137 140 139 139 140  K 3.7 5.3* 3.7 3.8 4.0  CL 102 102 96* 99* 97*  CO2 28 31 32 29 33*  GLUCOSE 152* 139* 175* 170* 164*  BUN 19 16 20  21* 22*  CREATININE 1.06* 1.08* 1.14* 1.13* 1.13*  CALCIUM 9.1 9.5 9.5 9.5 9.6    Transthoracic Echocardiography: Study Conclusions  - Left ventricle: The cavity size is small. Wall thickness was   increased in a pattern of severe LVH. Systolic function was   vigorous. The  estimated ejection fraction was 75%. Wall motion   was normal; there were no regional wall motion abnormalities.   Doppler parameters are consistent with abnormal left ventricular   relaxation (grade 1 diastolic dysfunction). The E/e&' ratio is   >15, suggesting elevated LV filling pressure. - Left atrium: The atrium was normal in size. - Inferior vena cava: The vessel was normal in size. The   respirophasic diameter changes were in the normal range (>= 50%),   consistent with normal central venous pressure.  Impressions:  - Compard to a prior study in 2015, the LVEF is higher at 75%.   There is now severe LV wall thickening, diastolic dysfunction and   elevated LV filling pressure.  Results/Tests Pending at Time of Discharge: None  Discharge Medications:  Allergies as of 10/27/2016   No Known Allergies     Medication List    STOP taking these medications   carvedilol 25 MG tablet Commonly known as:  COREG   furosemide 40 MG tablet Commonly known as:  LASIX   meloxicam 15 MG tablet Commonly known as:  MOBIC   spironolactone 25 MG tablet Commonly known as:  ALDACTONE   trimethoprim-polymyxin b ophthalmic solution Commonly known as:  POLYTRIM     TAKE these medications   albuterol 108 (90 Base) MCG/ACT inhaler Commonly known as:  PROVENTIL HFA;VENTOLIN HFA Inhale 2 puffs into the lungs every 6 (six) hours as needed for wheezing or shortness of breath.   amLODipine 5 MG tablet Commonly known as:  NORVASC Take 1 tablet (5 mg total) by mouth daily. Start taking on:  10/28/2016   aspirin 81 MG tablet Take 81 mg by mouth daily.   atorvastatin 40 MG tablet Commonly known as:  LIPITOR Take 1 tablet (40 mg total) by mouth daily.   DULERA 200-5 MCG/ACT Aero Generic drug:  mometasone-formoterol Inhale 2 puffs into the lungs 2 (two) times daily.   E-Z SPACER inhaler Use as instructed   gabapentin 300 MG capsule Commonly known as:  NEURONTIN Take 300 mg by mouth at  bedtime as needed (pain).   hydrocortisone 1 % ointment Apply 1 application topically 2 (two) times daily as needed (eczema on face).   ipratropium 0.06 % nasal spray Commonly known as:  ATROVENT Place 2 sprays into both nostrils 4 (four) times daily.   lisinopril 20 MG tablet Commonly known as:  PRINIVIL,ZESTRIL Take 1 tablet (20 mg total) by mouth daily. Start taking on:  10/28/2016 What changed:  See the new instructions.   metFORMIN 1000 MG tablet Commonly known as:  GLUCOPHAGE Take 1 tablet (1,000 mg total) by mouth 2 (two) times daily with a meal.   nicotine 21 mg/24hr patch Commonly known as:  NICODERM CQ - dosed in mg/24 hours Place 1 patch (21 mg total) onto the skin daily.   pindolol 5 MG tablet Commonly known as:  VISKEN Take 0.5 tablets (2.5 mg total) by mouth 2 (two) times daily.   tamsulosin 0.4 MG Caps capsule Commonly known as:  FLOMAX Take 1 capsule (0.4  mg total) by mouth daily.   tiotropium 18 MCG inhalation capsule Commonly known as:  SPIRIVA Place 1 capsule (18 mcg total) into inhaler and inhale daily.   tiZANidine 2 MG tablet Commonly known as:  ZANAFLEX Take 1 tablet by mouth at bedtime as needed for muscle spasms.   torsemide 20 MG tablet Commonly known as:  DEMADEX Take 3 tablets (60 mg total) by mouth 2 (two) times daily.   triamcinolone cream 0.1 % Commonly known as:  KENALOG Apply 1 application topically 3 (three) times daily as needed (eczema on arms).       Discharge Instructions: Please refer to Patient Instructions section of EMR for full details.  Patient was counseled important signs and symptoms that should prompt return to medical care, changes in medications, dietary instructions, activity restrictions, and follow up appointments.   Follow-Up Appointments: Follow-up [place] Follow up.   Why:  Rolling walker arranged- to be delivered to room prior to discharge Contact information: [place] [address] [phone]        Dimas Chyle, MD Follow up on 10/30/2016.   Specialty:  Family Medicine Why:  Your appointment is at 2:30 PM. Arrive 15 minutes early.  Contact information: [address] [address] [phone]        Fransico Him, MD. Schedule an appointment as soon as possible for a visit.   Specialty:  Cardiology Why:  as soon as possible Contact information: [address] [place] [address] [phone]           Jon K Melvin, MS4 10/27/2016, 10:53 PM [place]   Wendee Beavers, MD.  PGY-2, [place] [facility] Pager [phone] 10/27/16  10:53 PM

## 2016-10-24 DIAGNOSIS — E785 Hyperlipidemia, unspecified: Secondary | ICD-10-CM

## 2016-10-24 LAB — BASIC METABOLIC PANEL
Anion gap: 7 (ref 5–15)
BUN: 16 mg/dL (ref 6–20)
CALCIUM: 9.5 mg/dL (ref 8.9–10.3)
CO2: 31 mmol/L (ref 22–32)
CREATININE: 1.08 mg/dL — AB (ref 0.44–1.00)
Chloride: 102 mmol/L (ref 101–111)
GFR calc non Af Amer: 58 mL/min — ABNORMAL LOW (ref >60)
Glucose, Bld: 139 mg/dL — ABNORMAL HIGH (ref 65–99)
Potassium: 5.3 mmol/L — ABNORMAL HIGH (ref 3.5–5.1)
SODIUM: 140 mmol/L (ref 135–145)

## 2016-10-24 LAB — GLUCOSE, CAPILLARY
GLUCOSE-CAPILLARY: 151 mg/dL — AB (ref 65–99)
GLUCOSE-CAPILLARY: 168 mg/dL — AB (ref 65–99)
Glucose-Capillary: 148 mg/dL — ABNORMAL HIGH (ref 65–99)
Glucose-Capillary: 187 mg/dL — ABNORMAL HIGH (ref 65–99)

## 2016-10-24 MED ORDER — FUROSEMIDE 10 MG/ML IJ SOLN
80.0000 mg | Freq: Three times a day (TID) | INTRAMUSCULAR | Status: DC
  Administered 2016-10-24 – 2016-10-26 (×6): 80 mg via INTRAVENOUS
  Filled 2016-10-24 (×5): qty 8

## 2016-10-24 NOTE — Progress Notes (Addendum)
Inpatient Diabetes Program Recommendations  AACE/ADA: New Consensus Statement on Inpatient Glycemic Control (2015)  Target Ranges:  Prepandial:   less than 140 mg/dL      Peak postprandial:   less than 180 mg/dL (1-2 hours)      Critically ill patients:  140 - 180 mg/dL   Lab Results  Component Value Date   GLUCAP 151 (H) 10/24/2016   HGBA1C 7.3 (H) 10/22/2016   Results for ROSEMAE, MCQUOWN (MRN [id number]) as of 10/24/2016 08:52  Ref. Range 10/23/2016 06:58 10/23/2016 11:35 10/23/2016 16:28 10/23/2016 21:23 10/24/2016 06:52  Glucose-Capillary Latest Ref Range: 65 - 99 mg/dL 170 (H) 203 (H) 169 (H) 261 (H) 151 (H)   Review of Glycemic Control  Diabetes history: DM, Obesity (BMI=53.9) Outpatient Diabetes medications: Metformin 1000 mg BID Current orders for Inpatient glycemic control: Novolog 0-9 units [facility]  Inpatient Diabetes Program Recommendations:    Please consider Novolog 0-5 units QHS    Consult to RD made to address obesity and effect this has on health.    Please modify diet to Carb Modified.  Thank you,  Windy Carina, RN, MSN Diabetes Coordinator Inpatient Diabetes Program [phone] (Team Pager)

## 2016-10-24 NOTE — Progress Notes (Signed)
Transitions of Care Pharmacy Note  Plan:  -Advised pt that PTA furosemide will likely be transitioned to torsemide -Advised pt that PTA spironolactone is currently being held due to high levels of potassium, and will likely be held at discharge -Educated on importance of obtaining a scale for home measurement of weight, especially given likely transition to new diuretic and recent admit with fluid overload -Recommended strict follow-up with outpatient sleep study and discussed importance of controlling OSA -Recommended regular BP monitoring and encouraged pt to purchase BP cuff for home use --------------------------------------------- Noor Witte is an 56 y.o. female who presents with a chief complaint dyspnea/LEE. In anticipation of discharge, pharmacy has reviewed this patient's prior to admission medication history, as well as current inpatient medications listed per the [facility].  Current medication indications, dosing, frequency, and notable side effects reviewed with patient. patient verbalized understanding of current inpatient medication regimen and is aware that the After Visit Summary when presented, will represent the most accurate medication list at discharge.   Assessment: Understanding of regimen: good Understanding of indications: good Potential of compliance: good Barriers to Obtaining Medications: No  Patient instructed to contact inpatient pharmacy team with further questions or concerns if needed.    Time spent preparing for discharge counseling: 15 Time spent counseling patient: 66   Thank you for allowing pharmacy to be a part of this patient's care.  Arrie Senate, PharmD PGY-1 Pharmacy Resident Pager: [phone] 10/24/2016

## 2016-10-24 NOTE — Progress Notes (Signed)
Nutrition Education Note  RD consulted for nutrition education.  RD provided and discussed "Low Sodium Nutrition Therapy" and "Type 2 Diabetes Nutrition Therapy" handouts from the Academy of Nutrition and Dietetics. Reviewed patient's dietary recall. Provided examples on ways to decrease sodium intake in diet. Discouraged intake of processed foods and use of salt shaker. Pt rarely eats out and enjoys preparing food at home. She admits to adding some salt to foods. RD discussed sodium guidelines and sodium content of salt. Discussed sodium-free flavoring tips. Encouraged fresh fruits and vegetables as well as whole grain sources of carbohydrates to maximize fiber intake. RD discussed why it is important for patient to adhere to diet recommendations.    Lab Results  Component Value Date   HGBA1C 7.3 (H) 10/22/2016   Discussed different food groups and their effects on blood sugar, emphasizing carbohydrate-containing foods. Provided list of carbohydrates and recommended serving sizes of common foods. Discussed importance of controlled and consistent carbohydrate intake throughout the day. Provided examples of ways to balance meals/snacks and encouraged intake of high-fiber, whole grain complex carbohydrates. Teach back method. Expect good compliance. Pt reports plans to eliminate breakfast meats such as bacon and sausage, eat more non-starchy vegetables, and decrease portion sizes of rice.   Body mass index is 53.66 kg/m. Pt meets criteria for Morbid Obesity based on current BMI.  Current diet order is Heart Healthy, patient is consuming approximately 100% of meals at this time. Labs and medications reviewed. No further nutrition interventions warranted at this time. RD contact information provided. If additional nutrition issues arise, please re-consult RD.   Scarlette Ar RD, CSP, LDN Inpatient Clinical Dietitian Pager: [phone] After Hours Pager: [phone]

## 2016-10-24 NOTE — Progress Notes (Signed)
Family Medicine Teaching Service Daily Progress Note Intern Pager: [phone]  Patient name: Hailey Barnes Medical record number: [id number] Date of birth: 1961-01-16 Age: 56 y.o. Gender: female  Primary Care Provider: Dimas Chyle, MD Consultants: Cardiology Code Status: Full  Pt Overview and Major Events to Date:  On hospital night 0 she had a 4 second episode of skipped beats and she was taken off of carvedilol per cardiology's recommendation.  Assessment and Plan: Hailey Barnes. PMH is significant for CHF, COPD, hypertension, diabetes, CVA and sleep apnea.   Dyspnea, Likely 2/2 CHF Exacerbation:Improved. Negative fluid -1.1 L. Ambulated and maintained oxygen saturation greater than 94%. Echo on 10/23/2016 with EF 75%, severe LVH & G1DD -Appreciate cardiology recs  -Lasix IV 80 mg 3 times a day  -Lisinopril 40 mg daily  -Discontinued home coreg - Get strict I&Os and daily weights.  - Oxygen PRN.  - AM BMP - Continue telemetry  Hyperkalemia: K 5.3. Likely due to increase lisinopril. She is also on home Spironolactone. - Discontinue spironolactone - Repeat BMP in the morning  Sinus pause: was noted on telemetry the night of 10/23/2015. Likely due to OSA/OHS. She  was symptomatic with dyspnea and chest tightness. Coreg discontinued per cardiology recommendation. Sinus pauses and his symptoms resolved with CPAP.  -Sleep study as outpatient   COPD: Stable. On Dulera, Spiriva and albuterol at home. Not taking the Spiriva at home. Lung exam without increased work of breathing or wheezing.  -Continue home Dulera. -Scheduled DuoNeb BID.  -HoldSpiriva at this time.  Hypertension: Hypertensive this AM to 154/90. - Continue the above medications and amlodipine 5 mg - If BP not at goal, may consider increasing amlodipine to 10 mg  Diabetes:Most recent POCT Hgb A1c was 7.3 on 07/30/16. Has not  been taking metformin recently.  - Hold metformin 1000 mg PO BID while in the hospital, but plan to restart once discharged.  - SSI-thin.  Sleep Apnea: Had mask changed to over-the-nose and did not like this as compared to mouth-and-face mask.  - CPAP at night here with mouth-and-face mask. - Needs sleep study outpatient.  Chronic Back Pain: On meloxicam at home. Has good kidney function. - Continue gabapentin 300 mg PO at night PRN for pain.  - Holding meloxicam.  Depression: Stable. Euthymic mood. Not on any medication - Will continue to monitor.  FEN/GI: Heart healthy and carb modified diet  PPx: Lovenox for VTE  Disposition: Continue telemetry for further diuresis.   Subjective:  This morning states that she did not sleep well last night as her CPAP mask was changed from a mouth-and-face mask to an over-the-nose mask. She woke up fatigued this morning as a result. In regards to shortness of breath she says this is better. No chest pain. Barnes remains the same.   Objective: Temp:  [97.4 F (36.3 C)-98.3 F (36.8 C)] 97.9 F (36.6 C) (01/05 0625) Pulse Rate:  [72-150] 76 (01/05 0625) Resp:  [18-19] 18 (01/05 0625) BP: (133-158)/(62-107) 154/90 (01/05 0630) SpO2:  [94 %-100 %] 100 % (01/05 0625) Weight:  [141.8 kg (312 lb 9.6 oz)] 141.8 kg (312 lb 9.6 oz) (01/05 0630) Physical Exam: General: Appears tired but well, overall. Cardiovascular: Regular rate and rhythm without any murmurs, rubs or gallops.  Respiratory: Good work of breathing without any tripod posturing. No wheezes, crackles or rhonchi. Abdomen: Obese abdomen but soft non-tender and non-distended. Extremities: 1+ pitting edema bilaterally, worse on right  than the left. No Hohmann sign.   Laboratory:  Recent Labs Lab 10/22/16 1135 10/22/16 1617 10/23/16 0333  WBC 13.3* 11.8* 11.5*  HGB 15.1* 14.9 14.7  HCT 45.3 45.0 45.0  PLT 213 198 195    Recent Labs Lab 10/22/16 1135 10/22/16 1617  10/23/16 0333 10/24/16 0209  NA 139  --  137 140  K 4.5  --  3.7 5.3*  CL 103  --  102 102  CO2 28  --  28 31  BUN 15  --  19 16  CREATININE 1.04* 0.99 1.06* 1.08*  CALCIUM 9.3  --  9.1 9.5  GLUCOSE 149*  --  152* 139*   Imaging/Diagnostic Tests: Echo Study Conclusions  - Left ventricle: The cavity size is small. Wall thickness was   increased in a pattern of severe LVH. Systolic function was   vigorous. The estimated ejection fraction was 75%. Wall motion   was normal; there were no regional wall motion abnormalities.   Doppler parameters are consistent with abnormal left ventricular   relaxation (grade 1 diastolic dysfunction). The E/e&' ratio is   >15, suggesting elevated LV filling pressure. - Left atrium: The atrium was normal in size. - Inferior vena cava: The vessel was normal in size. The   respirophasic diameter changes were in the normal range (>= 50%),   consistent with normal central venous pressure.  Impressions:  - Compard to a prior study in 2015, the LVEF is higher at 75%.   There is now severe LV wall thickening, diastolic dysfunction and   elevated LV filling pressure.  Jacklynn Ganong, MS4 10/24/2016, 7:31 AM  [place] Intern pager: [phone], text pages welcome  I have seen and evaluated the patient with AI, Jon. I am in agreement with the note above in its revised form.   Wendee Beavers, MD, PGY-2 10/24/2016 2:51 PM

## 2016-10-24 NOTE — Progress Notes (Signed)
Patient Name: Hailey Barnes Date of Encounter: 10/24/2016  Primary Cardiologist: [facility] Problem List     Principal Problem:   Acute on chronic diastolic CHF (congestive heart failure) (HCC) Active Problems:   Hypertension   T2DM (type 2 diabetes mellitus) (HCC)   Hyperlipidemia   Chest pain   Shortness of breath   Lower extremity edema   Sinus pause     Subjective   Shortness of breath seems to be getting better. Chronic edema noted.  Inpatient Medications    Scheduled Meds: . amLODipine  5 mg Oral Daily  . aspirin EC  81 mg Oral Daily  . atorvastatin  40 mg Oral q1800  . docusate sodium  100 mg Oral BID  . enoxaparin (LOVENOX) injection  70 mg Subcutaneous Q24H  . furosemide  80 mg Intravenous BID  . insulin aspart  0-9 Units Subcutaneous TID WC  . ipratropium-albuterol  3 mL Nebulization BID  . lisinopril  40 mg Oral Daily  . mometasone-formoterol  2 puff Inhalation BID  . nicotine  21 mg Transdermal Q24H  . sodium chloride flush  3 mL Intravenous Q12H   Continuous Infusions:  PRN Meds: sodium chloride, acetaminophen **OR** acetaminophen, gabapentin, polyethylene glycol, sodium chloride flush, tiZANidine, traZODone   Vital Signs    Vitals:   10/23/16 2019 10/24/16 0625 10/24/16 0630 10/24/16 0915  BP: 133/62 (!) 158/107 (!) 154/90   Pulse: 87 76    Resp: 18 18    Temp: 97.4 F (36.3 C) 97.9 F (36.6 C)    TempSrc: Oral Oral    SpO2: 95% 100%  95%  Weight:   (!) 312 lb 9.6 oz (141.8 kg)   Height:        Intake/Output Summary (Last 24 hours) at 10/24/16 1301 Last data filed at 10/24/16 0634  Gross per 24 hour  Intake              480 ml  Output             2000 ml  Net            -1520 ml   Filed Weights   10/22/16 1557 10/23/16 0652 10/24/16 0630  Weight: (!) 313 lb 1.6 oz (142 kg) (!) 313 lb (142 kg) (!) 312 lb 9.6 oz (141.8 kg)    Physical Exam    GEN: Well nourished, well developed, in no acute distress.  HEENT: Grossly  normal.  Neck: Supple, no JVD, carotid bruits, or masses. Cardiac: RRR, no murmurs, rubs, or gallops. No clubbing, cyanosis,Chronic edema.  Radials/DP/PT 2+ and equal bilaterally.  Respiratory:  Respirations regular and unlabored, clear to auscultation bilaterally. GI: Soft, nontender, nondistended, BS + x 4. Obese MS: no deformity or atrophy. Skin: warm and dry, no rash. Neuro:  Strength and sensation are intact. Psych: AAOx3.  Normal affect.  Labs    CBC  Recent Labs  10/22/16 1617 10/23/16 0333  WBC 11.8* 11.5*  HGB 14.9 14.7  HCT 45.0 45.0  MCV 86.9 86.7  PLT 198 235   Basic Metabolic Panel  Recent Labs  10/23/16 0333 10/24/16 0209  NA 137 140  K 3.7 5.3*  CL 102 102  CO2 28 31  GLUCOSE 152* 139*  BUN 19 16  CREATININE 1.06* 1.08*  CALCIUM 9.1 9.5   Liver Function Tests No results for input(s): AST, ALT, ALKPHOS, BILITOT, PROT, ALBUMIN in the last 72 hours. No results for input(s): LIPASE, AMYLASE in the last 72  hours. Cardiac Enzymes  Recent Labs  10/22/16 1617 10/22/16 2109 10/23/16 0333  TROPONINI <0.03 <0.03 <0.03   BNP Invalid input(s): POCBNP D-Dimer No results for input(s): DDIMER in the last 72 hours. Hemoglobin A1C  Recent Labs  10/22/16 1617  HGBA1C 7.3*   Fasting Lipid Panel No results for input(s): CHOL, HDL, LDLCALC, TRIG, CHOLHDL, LDLDIRECT in the last 72 hours. Thyroid Function Tests No results for input(s): TSH, T4TOTAL, T3FREE, THYROIDAB in the last 72 hours.  Invalid input(s): FREET3  Telemetry    On 10/22/16-4 second pause noted at night likely because of sleep apnea. Carvedilol stopped. On 1/4 during sleep 5 second pause (off of Coreg) - Personally Reviewed  ECG    NSR - Personally Reviewed  Radiology    No results found.  Cardiac Studies   Echocardiogram: 05/2014 Study Conclusions  - Left ventricle: The cavity size was normal. Wall thickness was increased in a pattern of mild LVH. Systolic function  was vigorous. The estimated ejection fraction was in the range of 65% to 70%. Doppler parameters are consistent with abnormal left ventricular relaxation (grade 1 diastolic dysfunction). - Left atrium: The atrium was moderately dilated. - Pericardium, extracardiac: A trivial pericardial effusion was identified.  Patient Profile     47 with morbidObesity, uncontrolled hypertension, acute on chronic diastolic heart failure  Assessment & Plan    Acute on chronic diastolic heart failure  - I would continue with IV Lasix in fact since she only had 1.1 L out yesterday, I would increase to 80 mg IV 3 times a day  Uncontrolled hypertension  - Challenging to control. Hopefully diuresis will help.  - Amlodipine, lisinopril, diuresis  Sinus pause  - Up to 5 seconds during sleep  - This is related to obstructive sleep apnea/obesity hypoventilation syndrome.  - No pacemaker needed.  - She is now off of carvedilol 49m twice a day.  We will follow along.   Signed, MCandee Furbish MD  10/24/2016, 1:01 PM

## 2016-10-24 NOTE — Care Management Note (Signed)
Case Management Note Marvetta Gibbons RN, BSN Unit 2W-Case Manager [phone]  Patient Details  Name: Hailey Barnes MRN: [id number] Date of Birth: 1960/11/14  Subjective/Objective:  Pt admitted with CHF                Action/Plan: PTA pt lived at home- PCP- [name], CALEB M  Anticipate return home- CM to follow for potential d/c needs  Expected Discharge Date:                  Expected Discharge Plan:  Home/Self Care  In-House Referral:     Discharge planning Services  CM Consult  Post Acute Care Choice:    Choice offered to:     DME Arranged:    DME Agency:     HH Arranged:    HH Agency:     Status of Service:  In process, will continue to follow  If discussed at Long Length of Stay Meetings, dates discussed:    Additional Comments:  Dawayne Patricia, RN 10/24/2016, 3:44 PM

## 2016-10-25 DIAGNOSIS — E7800 Pure hypercholesterolemia, unspecified: Secondary | ICD-10-CM

## 2016-10-25 LAB — BASIC METABOLIC PANEL
Anion gap: 11 (ref 5–15)
BUN: 20 mg/dL (ref 6–20)
CO2: 32 mmol/L (ref 22–32)
Calcium: 9.5 mg/dL (ref 8.9–10.3)
Chloride: 96 mmol/L — ABNORMAL LOW (ref 101–111)
Creatinine, Ser: 1.14 mg/dL — ABNORMAL HIGH (ref 0.44–1.00)
GFR calc non Af Amer: 54 mL/min — ABNORMAL LOW (ref >60)
Glucose, Bld: 175 mg/dL — ABNORMAL HIGH (ref 65–99)
Potassium: 3.7 mmol/L (ref 3.5–5.1)
Sodium: 139 mmol/L (ref 135–145)

## 2016-10-25 LAB — GLUCOSE, CAPILLARY
GLUCOSE-CAPILLARY: 131 mg/dL — AB (ref 65–99)
Glucose-Capillary: 149 mg/dL — ABNORMAL HIGH (ref 65–99)
Glucose-Capillary: 121 mg/dL — ABNORMAL HIGH (ref 65–99)
Glucose-Capillary: 185 mg/dL — ABNORMAL HIGH (ref 65–99)

## 2016-10-25 MED ORDER — LISINOPRIL 10 MG PO TABS
20.0000 mg | ORAL_TABLET | Freq: Every day | ORAL | Status: DC
  Administered 2016-10-26 – 2016-10-27 (×2): 20 mg via ORAL
  Filled 2016-10-25 (×2): qty 2

## 2016-10-25 NOTE — Progress Notes (Signed)
RT NOTE:  Pt unable to tolerate CPAP. RN called stating patient could not get comfortable. Pt is a mouth breather and due to RT protocol pt is unable to wear FFM. Pt has never had a sleep study, this is a trial ordered by MD. RT adjusted pressure per RT protocol and pt still was unable to get comfortable. RT will monitor.

## 2016-10-25 NOTE — Progress Notes (Signed)
Family Medicine Teaching Service Daily Progress Note Intern Pager: [phone]  Patient name: Hailey Barnes Medical record number: [id number] Date of birth: [birth year] Age: 56 y.o. Gender: female  Primary Care Provider: Dimas Chyle, MD Consultants: Cardiology Code Status: Full  Pt Overview and Major Events to Date:  On hospital night 0 she had a 4 second episode of skipped beats and she was taken off of carvedilol per cardiology's recommendation.  Assessment and Plan: Hailey Barnes a 56 y.o.femalepresenting with shortness of breath and lower extremity swelling. PMH is significant for CHF, COPD, hypertension, diabetes, CVA and sleep apnea.   Dyspnea, Likely 2/2 CHF Exacerbation:Significantly improved. Negative fluid-2.4L. Weight down 15lbs from admit (319>>304lbs). Dry weight appears to be about 300 lbs.  Ambulated and maintained oxygen saturation greater than 94% on 10/23/2016. Echo on 10/23/2016 with EF 75%, severe LVH & G1DD -Appreciate cardiology recs  -Lasix IV 80 mg 3 times today with a plan to transition to by mouth torsemide on 10/26/2016  -Discontinued home coreg -Decrease lisinopril to 20 mg daily for soft BP and slight creatinine bumped (1.08 to 1.14) -Get strict I&Os and daily weights.  -Oxygen PRN.  -AM BMP -Continue telemetry -PT consulted  Hyperkalemia: Resolved. K 3.7 - Discontinued spironolactone - Repeat BMP in the morning  Sinus pause: resolved but bradycardic on telemetry. Sinus pause noted on telemetry the night of 10/23/2015. Likely due to OSA/OHS. She  was symptomatic with dyspnea and chest tightness when she had sinus pause. Coreg discontinued per cardiology recommendation. Sinus pauses and her symptoms resolved.  -Sleep study as outpatient -Continue to monitor  COPD: Stable. On Dulera, Spiriva and albuterol at home. Not taking the Spiriva at home. Lung exam without increased work of breathing or wheezing.  -Continue home Dulera. -Scheduled DuoNeb BID.   -HoldSpiriva at this time.  Hypertension:  BP 104/62 -Decrease lisinopril to 20 mg daily -IV Lasix per card as above -Continue amlodipine 5 mg daily -Monitor BP  Diabetes:Most recent POCT Hgb A1c was 7.3 on 07/30/16. Has not been taking metformin recently.  - Hold metformin - SSI-thin.  Sleep Apnea: not comfortable with CPAP masks. Last sleep study in 2015 with sleep apnea - Needs sleep study outpatient.  Chronic Back Pain: On meloxicam at home. Has good kidney function. - Continue gabapentin 300 mg PO at night PRN for pain.  - Holding meloxicam.  Depression: Stable. Euthymic mood. Not on any medication - Will continue to monitor.  FEN/GI: Heart healthy and carb modified diet  PPx: Lovenox for VTE  Disposition: Continue telemetry for further diuresis.   Subjective:  No major events overnight. She was uncomfortable with the CPAP masks. Denies chest pain, shortness of breath or palpitation.   Objective: Temp:  [97.6 F (36.4 C)-98.6 F (37 C)] 98.6 F (37 C) (01/06 0525) Pulse Rate:  [80-100] 81 (01/06 0012) Resp:  [18-20] 20 (01/06 0525) BP: (104-134)/(62-65) 104/62 (01/06 0525) SpO2:  [95 %-96 %] 96 % (01/06 0525) Weight:  [138.3 kg (304 lb 14.4 oz)] 138.3 kg (304 lb 14.4 oz) (01/06 0525) Physical Exam: GEN: appears well, able to sit up in bed easily for exam, no apparent distress. CVS: RRR, nl s1 & s2, no murmurs, trace edema in RLE RESP: no increased work of breathing, good air movement bilaterally, no rhonchi, crackles or wheeze GI: Obese, bowel sounds present and normal, soft, non-tender, non-distended MSK: Trace edema in right lower extremity. No calf tenderness or erythema SKIN: no apparent skin lesion NEURO: alert and oiented appropriately, no gross  defecits  PSYCH: euthymic mood with congruent affect  Laboratory:  Recent Labs Lab 10/22/16 1135 10/22/16 1617 10/23/16 0333  WBC 13.3* 11.8* 11.5*  HGB 15.1* 14.9 14.7  HCT 45.3 45.0 45.0   PLT 213 198 195    Recent Labs Lab 10/23/16 0333 10/24/16 0209 10/25/16 0246  NA 137 140 139  K 3.7 5.3* 3.7  CL 102 102 96*  CO2 28 31 32  BUN 19 16 20   CREATININE 1.06* 1.08* 1.14*  CALCIUM 9.1 9.5 9.5  GLUCOSE 152* 139* 175*   Imaging/Diagnostic Tests: No results found.  Wendee Beavers, MD, PGY-2 10/25/2016 8:28 AM

## 2016-10-25 NOTE — Progress Notes (Signed)
Patient Name: Hailey Barnes Date of Encounter: 10/25/2016  Primary Cardiologist: [facility] Problem List     Principal Problem:   Acute on chronic diastolic CHF (congestive heart failure) (HCC) Active Problems:   Hypertension   T2DM (type 2 diabetes mellitus) (HCC)   Hyperlipidemia   Chest pain   Shortness of breath   Lower extremity edema   Sinus pause     Subjective   Shortness of breath seems to be getting better. Chronic edema noted.  Inpatient Medications    Scheduled Meds: . amLODipine  5 mg Oral Daily  . aspirin EC  81 mg Oral Daily  . atorvastatin  40 mg Oral q1800  . docusate sodium  100 mg Oral BID  . enoxaparin (LOVENOX) injection  70 mg Subcutaneous Q24H  . furosemide  80 mg Intravenous TID  . insulin aspart  0-9 Units Subcutaneous TID WC  . lisinopril  40 mg Oral Daily  . mometasone-formoterol  2 puff Inhalation BID  . nicotine  21 mg Transdermal Q24H  . sodium chloride flush  3 mL Intravenous Q12H   Continuous Infusions:  PRN Meds: sodium chloride, acetaminophen **OR** acetaminophen, gabapentin, polyethylene glycol, sodium chloride flush, tiZANidine, traZODone   Vital Signs    Vitals:   10/24/16 2013 10/25/16 0012 10/25/16 0525 10/25/16 0922  BP: 121/63  104/62   Pulse: 100 81    Resp: 20 20 20    Temp: 97.6 F (36.4 C)  98.6 F (37 C)   TempSrc: Oral  Oral   SpO2: 95% 95% 96% 92%  Weight:   (!) 304 lb 14.4 oz (138.3 kg)   Height:        Intake/Output Summary (Last 24 hours) at 10/25/16 0926 Last data filed at 10/24/16 2200  Gross per 24 hour  Intake              480 ml  Output             1700 ml  Net            -1220 ml   Filed Weights   10/23/16 0652 10/24/16 0630 10/25/16 0525  Weight: (!) 313 lb (142 kg) (!) 312 lb 9.6 oz (141.8 kg) (!) 304 lb 14.4 oz (138.3 kg)    Physical Exam    GEN: Well nourished, well developed, in no acute distress.  HEENT: Grossly normal.  Neck: Supple, no JVD, carotid bruits, or  masses. Cardiac: RRR, no murmurs, rubs, or gallops. No clubbing, cyanosis,Chronic 1+edema.  Radials/DP/PT 2+ and equal bilaterally.  Respiratory:  Respirations regular and unlabored, clear to auscultation bilaterally. GI: Soft, nontender, nondistended, BS + x 4. Obese MS: no deformity or atrophy. Skin: warm and dry, no rash. Neuro:  Strength and sensation are intact. Psych: AAOx3.  Normal affect.  Labs    CBC  Recent Labs  10/22/16 1617 10/23/16 0333  WBC 11.8* 11.5*  HGB 14.9 14.7  HCT 45.0 45.0  MCV 86.9 86.7  PLT 198 037   Basic Metabolic Panel  Recent Labs  10/24/16 0209 10/25/16 0246  NA 140 139  K 5.3* 3.7  CL 102 96*  CO2 31 32  GLUCOSE 139* 175*  BUN 16 20  CREATININE 1.08* 1.14*  CALCIUM 9.5 9.5   Liver Function Tests No results for input(s): AST, ALT, ALKPHOS, BILITOT, PROT, ALBUMIN in the last 72 hours. No results for input(s): LIPASE, AMYLASE in the last 72 hours. Cardiac Enzymes  Recent Labs  10/22/16 1617 10/22/16  2109 10/23/16 0333  TROPONINI <0.03 <0.03 <0.03   BNP Invalid input(s): POCBNP D-Dimer No results for input(s): DDIMER in the last 72 hours. Hemoglobin A1C  Recent Labs  10/22/16 1617  HGBA1C 7.3*   Fasting Lipid Panel No results for input(s): CHOL, HDL, LDLCALC, TRIG, CHOLHDL, LDLDIRECT in the last 72 hours. Thyroid Function Tests No results for input(s): TSH, T4TOTAL, T3FREE, THYROIDAB in the last 72 hours.  Invalid input(s): FREET3  Telemetry    On 10/22/16-4 second pause noted at night likely because of sleep apnea. Carvedilol stopped. On 1/4 during sleep 5 second pause (off of Coreg) - Personally Reviewed  ECG    NSR - Personally Reviewed  Radiology    No results found.  Cardiac Studies   Echocardiogram: 05/2014 Study Conclusions  - Left ventricle: The cavity size was normal. Wall thickness was increased in a pattern of mild LVH. Systolic function was vigorous. The estimated ejection fraction was in  the range of 65% to 70%. Doppler parameters are consistent with abnormal left ventricular relaxation (grade 1 diastolic dysfunction). - Left atrium: The atrium was moderately dilated. - Pericardium, extracardiac: A trivial pericardial effusion was identified.  Patient Profile     31 with morbid obesity, uncontrolled hypertension, acute on chronic diastolic heart failure  Assessment & Plan    Acute on chronic diastolic heart failure -  She put out 1.7L yesterday and is net neg 2.4L.   -  Weight down  15lbs from admit (319>>304lbs) -  Renal function stable.   -  Her breathing is much improved and lungs are clear but still has 1+ LE edema.  Will continue Lasix 80 mg IV 3 times a day for today and consider changing to PO tomorrow.   Uncontrolled hypertension  - BP much improved  - Amlodipine, lisinopril, diuresis  Sinus pause  - Up to 5 seconds during sleep  - This is related to obstructive sleep apnea/obesity hypoventilation syndrome.  - No pacemaker needed.  - She is now off of carvedilol 1m twice a day.  We will follow along.   Signed, TFransico Him MD  10/25/2016, 9:26 AM

## 2016-10-25 NOTE — Progress Notes (Signed)
Patient refused CPAP use for the night. RT will continue to monitor as needed. 

## 2016-10-26 DIAGNOSIS — E1165 Type 2 diabetes mellitus with hyperglycemia: Secondary | ICD-10-CM

## 2016-10-26 LAB — BASIC METABOLIC PANEL
ANION GAP: 11 (ref 5–15)
BUN: 21 mg/dL — ABNORMAL HIGH (ref 6–20)
CALCIUM: 9.5 mg/dL (ref 8.9–10.3)
CO2: 29 mmol/L (ref 22–32)
Chloride: 99 mmol/L — ABNORMAL LOW (ref 101–111)
Creatinine, Ser: 1.13 mg/dL — ABNORMAL HIGH (ref 0.44–1.00)
GFR, EST NON AFRICAN AMERICAN: 54 mL/min — AB (ref >60)
Glucose, Bld: 170 mg/dL — ABNORMAL HIGH (ref 65–99)
POTASSIUM: 3.8 mmol/L (ref 3.5–5.1)
Sodium: 139 mmol/L (ref 135–145)

## 2016-10-26 LAB — GLUCOSE, CAPILLARY
GLUCOSE-CAPILLARY: 160 mg/dL — AB (ref 65–99)
GLUCOSE-CAPILLARY: 230 mg/dL — AB (ref 65–99)
Glucose-Capillary: 188 mg/dL — ABNORMAL HIGH (ref 65–99)
Glucose-Capillary: 234 mg/dL — ABNORMAL HIGH (ref 65–99)

## 2016-10-26 MED ORDER — FUROSEMIDE 10 MG/ML IJ SOLN
INTRAMUSCULAR | Status: AC
  Administered 2016-10-26: 80 mg
  Filled 2016-10-26: qty 4

## 2016-10-26 MED ORDER — ALBUTEROL SULFATE (2.5 MG/3ML) 0.083% IN NEBU: 2.5000 mg | INHALATION_SOLUTION | Freq: Four times a day (QID) | RESPIRATORY_TRACT | Status: DC | PRN

## 2016-10-26 MED ORDER — TORSEMIDE 20 MG PO TABS
60.0000 mg | ORAL_TABLET | Freq: Two times a day (BID) | ORAL | Status: DC
  Administered 2016-10-26 – 2016-10-27 (×2): 60 mg via ORAL
  Filled 2016-10-26 (×2): qty 3

## 2016-10-26 NOTE — Progress Notes (Signed)
Family Medicine Teaching Service Daily Progress Note Intern Pager: [phone]  Patient name: Hailey Barnes Medical record number: [id number] Date of birth: [birth year] Age: 56 y.o. Gender: female  Primary Care Provider: Dimas Chyle, MD Consultants: Cardiology Code Status: Full  Pt Overview and Major Events to Date:  On hospital night 0 she had a 4 second episode of skipped beats and she was taken off of carvedilol per cardiology's recommendation.  Assessment and Plan: Hailey Barnes a 56 y.o.femalepresenting with shortness of breath and lower extremity swelling. PMH is significant for CHF, COPD, hypertension, diabetes, CVA and sleep apnea.   Dyspnea, Likely 2/2 CHF Exacerbation:significantly improved with aggressive diuresis. Weight down 15lbs from admit (319>>304lbs) on 1/6. No weight from this morning yet but she is net 1.4L. Dry weight appears to be about 300 lbs. Echo on 10/23/2016 with EF 75%, severe LVH & G1DD -Appreciate cardiology recs  -Stop Lasix IV  -Start Torsemide 60 mg twice a day  -Lisinopril to 20 mg daily  -Get strict I&Os and daily weights.  -Oxygen PRN.   -AM BMP -Continue telemetry -f/u PT recs  Hyperkalemia: Resolved.  - Repeat BMP in the morning  Sinus pause: resolved but bradycardic on telemetry. Sinus pause noted on telemetry the night of 10/23/2015. Likely due to OSA/OHS. She  was symptomatic with dyspnea and chest tightness when she had sinus pause. Coreg discontinued per cardiology recommendation. -Sleep study as outpatient -Continue to monitor  COPD: Stable. On Dulera, Spiriva and albuterol at home. Not taking the Spiriva at home. Lung exam without increased work of breathing or wheezing.  -Continue home Dulera. -Albuterol as needed  -HoldSpiriva at this time.  Hypertension:normotesive -Decrease lisinopril to 20 mg daily -IV Lasix per card as above -Continue amlodipine 5 mg daily -Monitor BP  Diabetes:Most recent POCT Hgb A1c was 7.3  on 07/30/16. Has not been taking metformin recently.  - Hold metformin - SSI-thin.  Sleep Apnea: not comfortable with CPAP masks. Last sleep study in 2015 with sleep apnea - Needs sleep study outpatient.  Chronic Back Pain: On meloxicam at home. Has good kidney function. - Continue gabapentin 300 mg PO at night PRN for pain.  - Holding meloxicam.  Depression: Stable. Euthymic mood. Not on any medication - Will continue to monitor.  FEN/GI: Heart healthy and carb modified diet  PPx: Lovenox for VTE  Disposition: Continue telemetry for further diuresis.   Subjective: No major events overnight. She was uncomfortable with the CPAP mask again. Denies chest pain, shortness of breath or palpitation.   Objective: Temp:  [97.4 F (36.3 C)-98.6 F (37 C)] 97.6 F (36.4 C) (01/06 2100) Pulse Rate:  [90-92] 90 (01/06 2100) Resp:  [19-20] 19 (01/06 2100) BP: (101-142)/(62-85) 101/72 (01/06 2100) SpO2:  [92 %-99 %] 95 % (01/06 2100) Weight:  [138.3 kg (304 lb 14.4 oz)] 138.3 kg (304 lb 14.4 oz) (01/06 0525) Physical Exam:  GEN: appears well, able to sit up in bed easily for exam, no apparent distress. CVS: RRR, nl s1 & s2, no murmurs, trace edema in RLE RESP: no increased work of breathing, good air movement bilaterally, no rhonchi, crackles or wheeze GI: Obese, bowel sounds present and normal, soft, non-tender, non-distended MSK: Trace edema in right lower extremity. No calf tenderness or erythema SKIN: no apparent skin lesion NEURO: alert and oiented appropriately, no gross defecits  PSYCH: euthymic mood with congruent affect  Laboratory:  Recent Labs Lab 10/22/16 1135 10/22/16 1617 10/23/16 0333  WBC 13.3* 11.8* 11.5*  HGB 15.1*  14.9 14.7  HCT 45.3 45.0 45.0  PLT 213 198 195    Recent Labs Lab 10/23/16 0333 10/24/16 0209 10/25/16 0246  NA 137 140 139  K 3.7 5.3* 3.7  CL 102 102 96*  CO2 28 31 32  BUN 19 16 20   CREATININE 1.06* 1.08* 1.14*  CALCIUM 9.1  9.5 9.5  GLUCOSE 152* 139* 175*   Imaging/Diagnostic Tests: No results found.  Wendee Beavers, MD, PGY-2 10/26/2016 3:58 AM

## 2016-10-26 NOTE — Progress Notes (Signed)
Patient Name: Hailey Barnes Date of Encounter: 10/26/2016  Primary Cardiologist: [facility] Problem List     Principal Problem:   Acute on chronic diastolic CHF (congestive heart failure) (HCC) Active Problems:   Hypertension   T2DM (type 2 diabetes mellitus) (HCC)   Hyperlipidemia   Chest pain   Shortness of breath   Lower extremity edema   Sinus pause     Subjective   Shortness of breath seems to be getting better. Chronic edema noted.  Inpatient Medications    Scheduled Meds: . amLODipine  5 mg Oral Daily  . aspirin EC  81 mg Oral Daily  . atorvastatin  40 mg Oral q1800  . docusate sodium  100 mg Oral BID  . enoxaparin (LOVENOX) injection  70 mg Subcutaneous Q24H  . furosemide  80 mg Intravenous TID  . insulin aspart  0-9 Units Subcutaneous TID WC  . lisinopril  20 mg Oral Daily  . mometasone-formoterol  2 puff Inhalation BID  . nicotine  21 mg Transdermal Q24H  . sodium chloride flush  3 mL Intravenous Q12H   Continuous Infusions:  PRN Meds: sodium chloride, acetaminophen **OR** acetaminophen, albuterol, gabapentin, polyethylene glycol, sodium chloride flush, tiZANidine, traZODone   Vital Signs    Vitals:   10/25/16 2037 10/25/16 2100 10/26/16 0454 10/26/16 0725  BP:  101/72 119/89   Pulse:  90 90   Resp:  19 18   Temp:  97.6 F (36.4 C) 97.6 F (36.4 C)   TempSrc:  Oral Oral   SpO2: 94% 95% 92% 98%  Weight:      Height:        Intake/Output Summary (Last 24 hours) at 10/26/16 0931 Last data filed at 10/25/16 2200  Gross per 24 hour  Intake              360 ml  Output             1600 ml  Net            -1240 ml   Filed Weights   10/23/16 0652 10/24/16 0630 10/25/16 0525  Weight: (!) 313 lb (142 kg) (!) 312 lb 9.6 oz (141.8 kg) (!) 304 lb 14.4 oz (138.3 kg)    Physical Exam    GEN: Well nourished, well developed, in no acute distress.  HEENT: Grossly normal.  Neck: Supple, no JVD, carotid bruits, or masses. Cardiac: RRR, no  murmurs, rubs, or gallops. No clubbing, cyanosis,Chronic 1+edema.  Radials/DP/PT 2+ and equal bilaterally.  Respiratory:  Respirations regular and unlabored, clear to auscultation bilaterally. GI: Soft, nontender, nondistended, BS + x 4. Obese MS: no deformity or atrophy. Skin: warm and dry, no rash. Neuro:  Strength and sensation are intact. Psych: AAOx3.  Normal affect.  Labs    CBC No results for input(s): WBC, NEUTROABS, HGB, HCT, MCV, PLT in the last 72 hours. Basic Metabolic Panel  Recent Labs  10/25/16 0246 10/26/16 0341  NA 139 139  K 3.7 3.8  CL 96* 99*  CO2 32 29  GLUCOSE 175* 170*  BUN 20 21*  CREATININE 1.14* 1.13*  CALCIUM 9.5 9.5   Liver Function Tests No results for input(s): AST, ALT, ALKPHOS, BILITOT, PROT, ALBUMIN in the last 72 hours. No results for input(s): LIPASE, AMYLASE in the last 72 hours. Cardiac Enzymes No results for input(s): CKTOTAL, CKMB, CKMBINDEX, TROPONINI in the last 72 hours. BNP Invalid input(s): POCBNP D-Dimer No results for input(s): DDIMER in the last 72  hours. Hemoglobin A1C No results for input(s): HGBA1C in the last 72 hours. Fasting Lipid Panel No results for input(s): CHOL, HDL, LDLCALC, TRIG, CHOLHDL, LDLDIRECT in the last 72 hours. Thyroid Function Tests No results for input(s): TSH, T4TOTAL, T3FREE, THYROIDAB in the last 72 hours.  Invalid input(s): FREET3  Telemetry    On 10/22/16-4 second pause noted at night likely because of sleep apnea. Carvedilol stopped. On 1/4 during sleep 5 second pause (off of Coreg) - Personally Reviewed  ECG    NSR - Personally Reviewed  Radiology    No results found.  Cardiac Studies   Echocardiogram: 05/2014 Study Conclusions  - Left ventricle: The cavity size was normal. Wall thickness was increased in a pattern of mild LVH. Systolic function was vigorous. The estimated ejection fraction was in the range of 65% to 70%. Doppler parameters are consistent with abnormal  left ventricular relaxation (grade 1 diastolic dysfunction). - Left atrium: The atrium was moderately dilated. - Pericardium, extracardiac: A trivial pericardial effusion was identified.  Patient Profile     41 with morbid obesity, uncontrolled hypertension, acute on chronic diastolic heart failure  Assessment & Plan    Acute on chronic diastolic heart failure -  She put out 2L yesterday and is net neg 3.7L.   -  Weight down 15lbs from admit (319>>304lbs).  No weight this am.  -  Renal function stable.   -  Her breathing is much improved and lungs are clear but still has 1+ LE edema.  Will continue Lasix 80 mg IV 3 times a day for today and consider changing to PO tomorrow.   Uncontrolled hypertension  - BP much improved  - Amlodipine, lisinopril, diuresis  Sinus pause  - Up to 5 seconds during sleep  - This is related to obstructive sleep apnea/obesity hypoventilation syndrome.  - No pacemaker needed.  - She is now off of carvedilol 74m twice a day.  We will follow along.   Signed, TFransico Him MD  10/26/2016, 9:31 AM

## 2016-10-26 NOTE — Evaluation (Signed)
Physical Therapy Evaluation and Discharge Patient Details Name: Hailey Barnes MRN: [id number] DOB: 02-08-1961 Today's Date: 10/26/2016   History of Present Illness  She is a 56 y/O F with PMX of COPD, CHF,HTN, DM2, HLD who was sent to the ED by her PCP for dyspnea and leg swelling.  Clinical Impression  Pt functioning near baseline. Pt reports she cant walk more than household due to her back but she reports a desire to loose weight. Pt given RW to amb and she was able to amb 300' due to pressure being off back. Discussed making better nutrition choices and cutting out "sodas". Pt with no further acute PT needs at this time. PT SIGNING OFF. Please re-consult if needed in future.    Follow Up Recommendations No PT follow up;Supervision - Intermittent    Equipment Recommendations  Rolling walker with 5" wheels (shower seat)    Recommendations for Other Services       Precautions / Restrictions Precautions Precautions: Other (comment) Precaution Comments: obese      Mobility  Bed Mobility Overal bed mobility: Modified Independent             General bed mobility comments: HOB elevated, uses 4 pillows at home  Transfers Overall transfer level: Modified independent Equipment used: None             General transfer comment: safe  Ambulation/Gait Ambulation/Gait assistance: Modified independent (Device/Increase time) Ambulation Distance (Feet): 300 Feet Assistive device: Rolling walker (2 wheeled) Gait Pattern/deviations: Step-through pattern Gait velocity: wfl   General Gait Details: started amb without AD, pt reaching for object to hold onto due to increased back pain, once given RW, pt able to off weight back and amb 300', pt aware she needs to walk and loose weight, using the walker will allow her to increased ambulation tolerance  Stairs            Wheelchair Mobility    Modified Rankin (Stroke Patients Only)       Balance Overall balance  assessment: No apparent balance deficits (not formally assessed)                                           Pertinent Vitals/Pain Pain Assessment: 0-10 Pain Score: 3  Pain Location: back Pain Descriptors / Indicators: Aching Pain Intervention(s): Monitored during session    Home Living Family/patient expects to be discharged to:: Private residence Living Arrangements: Alone Available Help at Discharge: Family;Available PRN/intermittently Type of Home: Apartment Home Access: Level entry     Home Layout: One level Home Equipment: Cane - single point Additional Comments: daughter lives near by    Prior Function Level of Independence: Independent         Comments: amb limited to short distances due to back pain     Hand Dominance   Dominant Hand: Right    Extremity/Trunk Assessment   Upper Extremity Assessment Upper Extremity Assessment: Overall WFL for tasks assessed    Lower Extremity Assessment Lower Extremity Assessment: Overall WFL for tasks assessed    Cervical / Trunk Assessment Cervical / Trunk Assessment: Normal  Communication   Communication: No difficulties  Cognition Arousal/Alertness: Awake/alert Behavior During Therapy: WFL for tasks assessed/performed Overall Cognitive Status: Within Functional Limits for tasks assessed                      General  Comments General comments (skin integrity, edema, etc.): mild edema in R LE    Exercises     Assessment/Plan    PT Assessment Patent does not need any further PT services  PT Problem List            PT Treatment Interventions      PT Goals (Current goals can be found in the Care Plan section)  Acute Rehab PT Goals Patient Stated Goal: loose weight PT Goal Formulation: All assessment and education complete, DC therapy    Frequency     Barriers to discharge        Co-evaluation               End of Session   Activity Tolerance: Patient tolerated  treatment well Patient left: in chair;with call bell/phone within reach Nurse Communication: Mobility status         Time: 6381-7711 PT Time Calculation (min) (ACUTE ONLY): 13 min   Charges:   PT Evaluation $PT Eval Low Complexity: 1 Procedure     PT G Codes:        Ladye Macnaughton M Renna Kilmer 10/26/2016, 3:06 PM   Kittie Plater, PT, DPT Pager #: [phone] Office #: [phone]

## 2016-10-26 NOTE — Progress Notes (Signed)
Patient continues to refuse CPAP. CPAP removed from bedside. RT will monitor as needed.

## 2016-10-27 LAB — BASIC METABOLIC PANEL
ANION GAP: 10 (ref 5–15)
BUN: 22 mg/dL — AB (ref 6–20)
CALCIUM: 9.6 mg/dL (ref 8.9–10.3)
CO2: 33 mmol/L — ABNORMAL HIGH (ref 22–32)
Chloride: 97 mmol/L — ABNORMAL LOW (ref 101–111)
Creatinine, Ser: 1.13 mg/dL — ABNORMAL HIGH (ref 0.44–1.00)
GFR calc Af Amer: >60 mL/min (ref >60)
GFR, EST NON AFRICAN AMERICAN: 54 mL/min — AB (ref >60)
GLUCOSE: 164 mg/dL — AB (ref 65–99)
Potassium: 4 mmol/L (ref 3.5–5.1)
SODIUM: 140 mmol/L (ref 135–145)

## 2016-10-27 LAB — GLUCOSE, CAPILLARY
GLUCOSE-CAPILLARY: 170 mg/dL — AB (ref 65–99)
Glucose-Capillary: 142 mg/dL — ABNORMAL HIGH (ref 65–99)

## 2016-10-27 MED ORDER — PINDOLOL 5 MG PO TABS
2.5000 mg | ORAL_TABLET | Freq: Two times a day (BID) | ORAL | 0 refills | Status: DC
Start: 2016-10-27 — End: 2016-12-10

## 2016-10-27 MED ORDER — PINDOLOL 5 MG PO TABS
2.5000 mg | ORAL_TABLET | Freq: Two times a day (BID) | ORAL | Status: DC
  Administered 2016-10-27: 2.5 mg via ORAL
  Filled 2016-10-27: qty 1

## 2016-10-27 MED ORDER — NICOTINE 21 MG/24HR TD PT24: 21.0000 mg | MEDICATED_PATCH | TRANSDERMAL | 0 refills | Status: DC

## 2016-10-27 MED ORDER — LISINOPRIL 20 MG PO TABS: 20.0000 mg | ORAL_TABLET | Freq: Every day | ORAL | 0 refills | Status: DC

## 2016-10-27 MED ORDER — TORSEMIDE 20 MG PO TABS
60.0000 mg | ORAL_TABLET | Freq: Two times a day (BID) | ORAL | 0 refills | Status: DC
Start: 2016-10-27 — End: 2016-12-10

## 2016-10-27 MED ORDER — AMLODIPINE BESYLATE 5 MG PO TABS: 5.0000 mg | ORAL_TABLET | Freq: Every day | ORAL | 0 refills | Status: DC

## 2016-10-27 NOTE — Care Management Note (Signed)
Case Management Note Marvetta Gibbons RN, BSN Unit 2W-Case Manager [phone]  Patient Details  Name: Hailey Barnes MRN: [id number] Date of Birth: 10-30-1960  Subjective/Objective:  Pt admitted with CHF                Action/Plan: PTA pt lived at home- PCP- [name], CALEB M  Anticipate return home- CM to follow for potential d/c needs  Expected Discharge Date:     10/27/16             Expected Discharge Plan:  Home/Self Care  In-House Referral:     Discharge planning Services  CM Consult  Post Acute Care Choice:  Durable Medical Equipment Choice offered to:  Patient  DME Arranged:  [place]ford Rile rolling DME Agency:  [place] Arranged:  NA [place] Agency:  NA  Status of Service:  Completed, signed off  If discussed at [place] of Stay Meetings, dates discussed:    Discharge Disposition: home/self care   Additional Comments:  10/27/16- 1100- Marvetta Gibbons RN, CM- spoke with pt at bedside- confirmed that pt needed RW for discharge- call made to [facility] with [facility] for DME need- RW to be delivered to room prior to discharge.   Dawayne Patricia, RN 10/27/2016, 12:35 PM

## 2016-10-27 NOTE — Progress Notes (Signed)
Patient given discharge instructions, medication list and personal prescriptions sent to patients pharmacy. Follow up appointments also given. Patient verbalized understanding. IV and tele dc'd will discharge home as ordered. Care notes given to patient on torsemide, lisinopril, pindolol, and norvasc.  Kasmira Cacioppo, Bettina Gavia RN

## 2016-10-27 NOTE — Progress Notes (Signed)
Inpatient Diabetes Program Recommendations  AACE/ADA: New Consensus Statement on Inpatient Glycemic Control (2015)  Target Ranges:  Prepandial:   less than 140 mg/dL      Peak postprandial:   less than 180 mg/dL (1-2 hours)      Critically ill patients:  140 - 180 mg/dL   Lab Results  Component Value Date   GLUCAP 170 (H) 10/27/2016   HGBA1C 7.3 (H) 10/22/2016   Results for CAIT, LOCUST (MRN [id number]) as of 10/27/2016 09:34  Ref. Range 10/26/2016 06:04 10/26/2016 11:35 10/26/2016 16:43 10/26/2016 21:44 10/27/2016 06:22  Glucose-Capillary Latest Ref Range: 65 - 99 mg/dL 188 (H) 160 (H) 230 (H) 234 (H) 170 (H)   Review of Glycemic Control  Diabetes history: DM, Morbid Obesity (BMI=53.9) Outpatient Diabetes medications: Metformin 1000 mg BID Current orders for Inpatient glycemic control: Novolog 0-9 units [facility]  Inpatient Diabetes Program Recommendations:    Please consider Novolog 0-5 units QHS.    Please consider meal coverage of 3 units TIDAC if patient eats > 50% of meal.    Modified diet to Carb Modified.  Thank you,  Windy Carina, RN, MSN Diabetes Coordinator Inpatient Diabetes Program [phone] (Team Pager)

## 2016-10-27 NOTE — Progress Notes (Addendum)
Patient Name: Hailey Barnes Date of Encounter: 10/27/2016  Primary Cardiologist:   Dr. Cliffton Asters Problem List     Principal Problem:   Acute on chronic diastolic CHF (congestive heart failure) ([place]) Active Problems:   Hypertension   T2DM (type 2 diabetes mellitus) ([place])   Hyperlipidemia   Chest pain   Shortness of breath   Lower extremity edema   Sinus pause   Controlled type 2 diabetes mellitus with hyperglycemia ([place])     Subjective   She reports that she is breathing better.  Denies chest pain.    Inpatient Medications    Scheduled Meds: . amLODipine  5 mg Oral Daily  . aspirin EC  81 mg Oral Daily  . atorvastatin  40 mg Oral q1800  . docusate sodium  100 mg Oral BID  . enoxaparin (LOVENOX) injection  70 mg Subcutaneous Q24H  . insulin aspart  0-9 Units Subcutaneous TID WC  . lisinopril  20 mg Oral Daily  . mometasone-formoterol  2 puff Inhalation BID  . nicotine  21 mg Transdermal Q24H  . sodium chloride flush  3 mL Intravenous Q12H  . torsemide  60 mg Oral BID   Continuous Infusions:  PRN Meds: sodium chloride, acetaminophen **OR** acetaminophen, albuterol, gabapentin, polyethylene glycol, sodium chloride flush, tiZANidine, traZODone   Vital Signs    Vitals:   10/26/16 1228 10/26/16 2016 10/26/16 2051 10/27/16 0513  BP: 124/87 140/87  116/85  Pulse: 90 96  87  Resp: 18 18  18   Temp: 97.9 F (36.6 C) 98 F (36.7 C)  97.8 F (36.6 C)  TempSrc: Oral Oral  Oral  SpO2: 92% 96% 93% 96%  Weight:    (!) 306 lb (138.8 kg)  Height:        Intake/Output Summary (Last 24 hours) at 10/27/16 0845 Last data filed at 10/27/16 0326  Gross per 24 hour  Intake             1160 ml  Output             1700 ml  Net             -540 ml   Filed Weights   10/25/16 0525 10/26/16 1000 10/27/16 0513  Weight: (!) 304 lb 14.4 oz (138.3 kg) (!) 308 lb 6.8 oz (139.9 kg) (!) 306 lb (138.8 kg)    Physical Exam    GEN: NAD.  Neck:  No JVD (exam difficult  secondary to size.) Cardiac: Irregular Rate and Rhythm, upper right sternal border murmur, no murmurs, rubs, or gallops.  Moderate/severe leg edema.  Radials/DP/PT 2+  and equal bilaterally.  Respiratory:  Respirations  regular and unlabored, clear to auscultation bilaterally. GI: Soft, nontender, nondistended, BS + x 4. Skin: warm and dry, no rash. Neuro:   Strength and sensation are intact. Psych:  AAOx3.  Normal affect.  Labs    CBC No results for input(s): WBC, NEUTROABS, HGB, HCT, MCV, PLT in the last 72 hours. Basic Metabolic Panel  Recent Labs  10/26/16 0341 10/27/16 0316  NA 139 140  K 3.8 4.0  CL 99* 97*  CO2 29 33*  GLUCOSE 170* 164*  BUN 21* 22*  CREATININE 1.13* 1.13*  CALCIUM 9.5 9.6   Liver Function Tests No results for input(s): AST, ALT, ALKPHOS, BILITOT, PROT, ALBUMIN in the last 72 hours. No results for input(s): LIPASE, AMYLASE in the last 72 hours. Cardiac Enzymes No results for input(s): CKTOTAL, CKMB, CKMBINDEX, TROPONINI in  the last 72 hours. BNP Invalid input(s): POCBNP D-Dimer No results for input(s): DDIMER in the last 72 hours. Hemoglobin A1C No results for input(s): HGBA1C in the last 72 hours. Fasting Lipid Panel No results for input(s): CHOL, HDL, LDLCALC, TRIG, CHOLHDL, LDLDIRECT in the last 72 hours. Thyroid Function Tests No results for input(s): TSH, T4TOTAL, T3FREE, THYROIDAB in the last 72 hours.  Invalid input(s): FREET3  Telemetry    Sinus tachycardia.  Slower rates at night but no sustained pauses. - Personally Reviewed  ECG    NA - Personally Reviewed  Radiology    No results found.  Cardiac Studies   ECHO - Left ventricle: The cavity size is small. Wall thickness was   increased in a pattern of severe LVH. Systolic function was   vigorous. The estimated ejection fraction was 75%. Wall motion   was normal; there were no regional wall motion abnormalities.   Doppler parameters are consistent with abnormal left  ventricular   relaxation (grade 1 diastolic dysfunction). The E/e&' ratio is   >15, suggesting elevated LV filling pressure. - Left atrium: The atrium was normal in size. - Inferior vena cava: The vessel was normal in size. The   respirophasic diameter changes were in the normal range (>= 50%),   consistent with normal central venous pressure.  Patient Profile     Deshanae Lindo a 56 y.o.female with past medical history of chronic diastolic CHF (EF 72-53% by echo in 05/2014), COPD, HTN, HLD, Type 2 DM, prior CVA, tobacco use, and history of substance use who presented to [facility] ED from the [place] office on 10/22/2016 fpr evaluation of chest discomfort and worsening dyspnea.   Assessment & Plan    BRADYCARDIA:  Carvedilol was stopped.  Telemetry as above.  Likely thought to be related to sleep apnea.    She does need better rate control and I will try to start Pindolol.    ACUTE ON CHRONIC DIASTOLIC HF:   Down 6.64 liters since admit.   Weight is down about 7 lbs.  Creat is stable.  Now on PO diuresis.  She needs to keep her feet up when resting and reduce her salt.   CHEST PAIN:  Thought to be atypical and no objective evidence of ischemia.  No further work up.    HTN:  The blood pressure is at target. No change in medications is indicated. We will continue with therapeutic lifestyle changes (TLC).  SNORING:  Needs an out patient work up.  Likely does have sleep apnea.   Signed, Minus Breeding, MD  10/27/2016, 8:45 AM

## 2016-10-27 NOTE — Care Management Important Message (Signed)
Important Message  Patient Details  Name: Hailey Barnes MRN: [id number] Date of Birth: 06/04/1961   Medicare Important Message Given:  Yes    Nathen May 10/27/2016, 12:51 PM

## 2016-10-30 ENCOUNTER — Ambulatory Visit (INDEPENDENT_AMBULATORY_CARE_PROVIDER_SITE_OTHER): Payer: Medicare HMO | Admitting: Family Medicine

## 2016-10-30 ENCOUNTER — Telehealth: Payer: Self-pay | Admitting: Family Medicine

## 2016-10-30 VITALS — BP 130/90 | HR 96 | Temp 97.5°F | Ht 65.0 in | Wt 304.4 lb

## 2016-10-30 DIAGNOSIS — I5032 Chronic diastolic (congestive) heart failure: Secondary | ICD-10-CM

## 2016-10-30 DIAGNOSIS — Z720 Tobacco use: Secondary | ICD-10-CM

## 2016-10-30 DIAGNOSIS — E118 Type 2 diabetes mellitus with unspecified complications: Secondary | ICD-10-CM

## 2016-10-30 DIAGNOSIS — G4733 Obstructive sleep apnea (adult) (pediatric): Secondary | ICD-10-CM | POA: Diagnosis not present

## 2016-10-30 MED ORDER — ACCU-CHEK SOFTCLIX LANCET DEV MISC: 11 refills | Status: DC

## 2016-10-30 MED ORDER — GLUCOSE BLOOD VI STRP: ORAL_STRIP | 12 refills | Status: DC

## 2016-10-30 MED ORDER — ACCU-CHEK AVIVA PLUS W/DEVICE KIT: PACK | 0 refills | Status: DC

## 2016-10-30 NOTE — Progress Notes (Signed)
   Subjective:  Hailey Barnes is a 56 y.o. female who presents to the [facility] today with a chief complaint of hospital follow up.   HPI:  HFpEF Patient admitted last week for a 19 pound weight gain. She was diuresed aggresively and sent home on torsemide 60 twice daily. Tolerating this regimen at home, though has noticed increased cramping in her legs and arms that she thinks is due to the torsemide. No LE edema. No shortness of breath.    OSA Patient with a history of OSA and has not been able to tolerate CPAP in the past. While admitted, she was noted to have a sinus pause while sleeping thought to be related to her OSA.  T2DM Patient previously prescribed metformin, though she had recent stopped it due to her thinking it was causing pain in her legs. During her hospitalization her A1c was found to be elevated and she was told to restart the metformin. She has started back on metformin since then and tolerated it well.   ROS: Per HPI  PMH: Smoking history reviewed.   Objective:  Physical Exam: BP 130/90 (BP Location: Left Arm, Patient Position: Sitting, Cuff Size: Large)   Pulse 96   Temp 97.5 F (36.4 C) (Oral)   Ht 5' 5"  (1.651 m)   Wt (!) 304 lb 6.4 oz (138.1 kg)   LMP 08/31/2015   SpO2 98%   BMI 50.65 kg/m   Gen: NAD, resting comfortably CV: RRR with no murmurs appreciated Pulm: NWOB, CTAB with no crackles, wheezes, or rhonchi GI: Obese, normal bowel sounds present. Soft, Nontender, Nondistended. MSK: Trace LE edema.  Skin: warm, dry Neuro: grossly normal, moves all extremities Psych: Normal affect and thought content  Assessment/Plan:  Chronic diastolic CHF (congestive heart failure) (HCC) Weight down 2 pounds since discharge. Only trace LE edema today without other signs of volume overload. Will continue current regimen of torsemide 77m bid and lisinopril 266mdaily. Patient will be following up with cardiology within the next 1-2 weeks. Will check CMET today with  magnesium.   OSA (obstructive sleep apnea) Split night sleep study ordered.   T2DM (type 2 diabetes mellitus) (HCC) A1c increased to 7.3. Continue metformin 100040mid as patient has only started on this within the last week. Extensively discussed lifestyle modifications with patient. Referral to DM educator and nutritionist placed today. Follow up in 3 months.   Tobacco abuse Patient currently using nicotine patches. Encouraged cessation. Patient deferred going to smoking cessation clinic.   CalAlgis GreenhousearJerline PainD [place] PGY-3 10/30/2016 3:52 PM

## 2016-10-30 NOTE — Assessment & Plan Note (Signed)
Weight down 2 pounds since discharge. Only trace LE edema today without other signs of volume overload. Will continue current regimen of torsemide 65m bid and lisinopril 234mdaily. Patient will be following up with cardiology within the next 1-2 weeks. Will check CMET today with magnesium.

## 2016-10-30 NOTE — Assessment & Plan Note (Signed)
A1c increased to 7.3. Continue metformin 1074m bid as patient has only started on this within the last week. Extensively discussed lifestyle modifications with patient. Referral to DM educator and nutritionist placed today. Follow up in 3 months.

## 2016-10-30 NOTE — Telephone Encounter (Signed)
Rx for lancets and strips sent in.  [name]. [name], [place] Resident PGY-3 10/30/2016 5:05 PM

## 2016-10-30 NOTE — Patient Instructions (Signed)
We will check blood work today.  We will send you to have a sleep study.  I sent in a prescription for your glucometer.  We sent in a referral for the diabetes educator and nutritionist.  I am glad that you are trying to stop smoking. Please let us know how we can help.  Come back to see me in 3 months.  Take care,  Dr [name]

## 2016-10-30 NOTE — Assessment & Plan Note (Signed)
Split night sleep study ordered.

## 2016-10-30 NOTE — Assessment & Plan Note (Signed)
Patient currently using nicotine patches. Encouraged cessation. Patient deferred going to smoking cessation clinic.

## 2016-10-30 NOTE — Telephone Encounter (Signed)
Need test strips and lancets for her glucometer.  When she picked it up today, her meter only had 10 of each.  [facility] on [address]

## 2016-10-31 ENCOUNTER — Telehealth: Payer: Self-pay | Admitting: Family Medicine

## 2016-10-31 LAB — COMPLETE METABOLIC PANEL WITH GFR
ALT: 27 U/L (ref 6–29)
AST: 19 U/L (ref 10–35)
Albumin: 4.1 g/dL (ref 3.6–5.1)
Alkaline Phosphatase: 81 U/L (ref 33–130)
BILIRUBIN TOTAL: 0.4 mg/dL (ref 0.2–1.2)
BUN: 30 mg/dL — ABNORMAL HIGH (ref 7–25)
CO2: 29 mmol/L (ref 20–31)
CREATININE: 1.32 mg/dL — AB (ref 0.50–1.05)
Calcium: 9.9 mg/dL (ref 8.6–10.4)
Chloride: 98 mmol/L (ref 98–110)
GFR, Est African American: 53 mL/min — ABNORMAL LOW (ref >=60)
GFR, Est Non African American: 46 mL/min — ABNORMAL LOW (ref >=60)
GLUCOSE: 111 mg/dL — AB (ref 65–99)
Potassium: 3.6 mmol/L (ref 3.5–5.3)
SODIUM: 140 mmol/L (ref 135–146)
TOTAL PROTEIN: 7.1 g/dL (ref 6.1–8.1)

## 2016-10-31 LAB — MAGNESIUM: Magnesium: 1.5 mg/dL (ref 1.5–2.5)

## 2016-10-31 MED ORDER — MAGNESIUM CHLORIDE 64 MG PO TBEC: 2.0000 | DELAYED_RELEASE_TABLET | Freq: Every day | ORAL | 0 refills | Status: DC

## 2016-10-31 NOTE — Telephone Encounter (Signed)
Called patient to discuss lab results. Informed her of low magnesium that could be contributing to her symptoms. Her creatinine was also slightly elevated. Will start oral magnesium supplementation. Discussed possible side effects. Patient will be following up with cardiology within the next week or two.  Patient also informed me that she was having a burning sensation in her thumb and middle finger in her right hand. I told the patient that it was difficult to determine what was causing her symptoms, but it would be a good idea for her to be evaluated today. Patient voiced understanding and had no further questions.  [name]. [name], [place] Resident PGY-3 10/31/2016 9:32 AM

## 2016-11-04 ENCOUNTER — Encounter (HOSPITAL_BASED_OUTPATIENT_CLINIC_OR_DEPARTMENT_OTHER): Payer: Medicare HMO

## 2016-11-05 ENCOUNTER — Ambulatory Visit: Payer: Medicare HMO | Admitting: Cardiology

## 2016-11-09 ENCOUNTER — Ambulatory Visit (HOSPITAL_BASED_OUTPATIENT_CLINIC_OR_DEPARTMENT_OTHER): Payer: Medicare HMO | Attending: Family Medicine | Admitting: Internal Medicine

## 2016-11-09 VITALS — Ht 64.0 in | Wt 300.0 lb

## 2016-11-09 DIAGNOSIS — G4733 Obstructive sleep apnea (adult) (pediatric): Secondary | ICD-10-CM | POA: Insufficient documentation

## 2016-11-15 DIAGNOSIS — G4733 Obstructive sleep apnea (adult) (pediatric): Secondary | ICD-10-CM | POA: Diagnosis not present

## 2016-11-15 NOTE — Procedures (Signed)
Patient Name: Hailey Barnes, Hailey Barnes Date: 11/09/2016 Gender: Female D.O.B: April 28, 1961 Age (years): 8 Referring Provider: Andrena Mews Height (inches): 36 Interpreting Physician: Baird Lyons MD, ABSM Weight (lbs): 300 RPSGT: Baxter Flattery BMI: 51 MRN: [id number] Neck Size: 18.50 CLINICAL INFORMATION Sleep Study Type: Split Night CPAP   Indication for sleep study: Fatigue, Hypertension, Obesity, OSA, Snoring, Witnessed Apneas   Epworth Sleepiness Score: 20/24  SLEEP STUDY TECHNIQUE As per the AASM Manual for the Scoring of Sleep and Associated Events v2.3 (April 2016) with a hypopnea requiring 4% desaturations.  The channels recorded and monitored were frontal, central and occipital EEG, electrooculogram (EOG), submentalis EMG (chin), nasal and oral airflow, thoracic and abdominal wall motion, anterior tibialis EMG, snore microphone, electrocardiogram, and pulse oximetry. Continuous positive airway pressure (CPAP) was initiated when the patient met split night criteria and was titrated according to treat sleep-disordered breathing.  MEDICATIONS Medications self-administered by patient taken the night of the study : none reported  RESPIRATORY PARAMETERS Diagnostic  Total AHI (/hr): 43.7 RDI (/hr): 43.7 OA Index (/hr): 14.4 CA Index (/hr): 0.0 REM AHI (/hr): 80.0 NREM AHI (/hr): 37.3 Supine AHI (/hr): 20.7 Non-supine AHI (/hr): 46.30 Min O2 Sat (%): 72.00 Mean O2 (%): 89.01 Time below 88% (min): 45.2   Titration  Optimal Pressure (cm): 16 AHI at Optimal Pressure (/hr): 4.1 Min O2 at Optimal Pressure (%): 88.0 Supine % at Optimal (%): 0 Sleep % at Optimal (%): 97    SLEEP ARCHITECTURE The recording time for the entire night was 371.5 minutes.  During a baseline period of 168.5 minutes, the patient slept for 141.5 minutes in REM and nonREM, yielding a sleep efficiency of 84.0%. Sleep onset after lights out was 5.8 minutes with a REM latency of 87.0 minutes. The patient  spent 8.83% of the night in stage N1 sleep, 76.33% in stage N2 sleep, 0.00% in stage N3 and 14.84% in REM.  During the titration period of 186.0 minutes, the patient slept for 160.2 minutes in REM and nonREM, yielding a sleep efficiency of 86.1%. Sleep onset after CPAP initiation was 21.8 minutes with a REM latency of 13.5 minutes. The patient spent 3.43% of the night in stage N1 sleep, 46.82% in stage N2 sleep, 0.00% in stage N3 and 49.74% in REM.  CARDIAC DATA The 2 lead EKG demonstrated sinus rhythm. The mean heart rate was 76.35 beats per minute. Other EKG findings include: None.  LEG MOVEMENT DATA The total Periodic Limb Movements of Sleep (PLMS) were 62. The PLMS index was 12.33 .  IMPRESSIONS - Severe obstructive sleep apnea occurred during the diagnostic portion of the study (AHI = 43.7/hour). An optimal PAP pressure was selected for this patient ( 16 cm of water) - No significant central sleep apnea occurred during the diagnostic portion of the study (CAI = 0.0/hour). - Severe oxygen desaturation was noted during the diagnostic portion of the study (Min O2 = 72.00%). - No snoring was audible during the diagnostic portion of the study. - No cardiac abnormalities were noted during this study. 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  DIAGNOSIS - Obstructive Sleep Apnea (327.23 [G47.33 ICD-10])  RECOMMENDATIONS - Trial of CPAP therapy on 16 cm H2O with a Medium size Resmed Full Face Mask AirFit F20 mask and heated humidification. - Avoid alcohol, sedatives and other CNS depressants that may worsen sleep apnea and disrupt normal sleep architecture. - Sleep hygiene should be reviewed to assess factors that may improve sleep quality. - Weight  management and regular exercise should be initiated or continued. - If limb movements continue to disturb sleep after control of OSA, then a trial of specific therapy such as Requiip or Mirapex may be  considered.  [Electronically signed] 11/15/2016 11:20 AM  Baird Lyons MD, ABSM Diplomate, American Board of Sleep Medicine   NPI: [id number]  [place], American Board of Sleep Medicine  ELECTRONICALLY SIGNED ON:  11/15/2016, 11:16 AM [place] PH: [phone]   FX: [phone] [place]

## 2016-11-20 ENCOUNTER — Other Ambulatory Visit: Payer: Self-pay | Admitting: Family Medicine

## 2016-11-20 ENCOUNTER — Other Ambulatory Visit: Payer: Self-pay | Admitting: Student

## 2016-11-20 DIAGNOSIS — I10 Essential (primary) hypertension: Secondary | ICD-10-CM

## 2016-11-20 DIAGNOSIS — G4733 Obstructive sleep apnea (adult) (pediatric): Secondary | ICD-10-CM

## 2016-11-20 NOTE — Progress Notes (Signed)
Orders faxed to [facility].

## 2016-11-20 NOTE — Progress Notes (Signed)
CPAP order placed.  [name]. [name], [place] Resident PGY-3 11/20/2016 10:32 AM

## 2016-11-25 ENCOUNTER — Other Ambulatory Visit: Payer: Self-pay | Admitting: Family Medicine

## 2016-11-25 ENCOUNTER — Encounter: Payer: Self-pay | Admitting: Family Medicine

## 2016-11-25 ENCOUNTER — Ambulatory Visit (INDEPENDENT_AMBULATORY_CARE_PROVIDER_SITE_OTHER): Payer: Medicare HMO | Admitting: Family Medicine

## 2016-11-25 DIAGNOSIS — E669 Obesity, unspecified: Secondary | ICD-10-CM

## 2016-11-25 DIAGNOSIS — Z6841 Body Mass Index (BMI) 40.0 and over, adult: Secondary | ICD-10-CM | POA: Diagnosis not present

## 2016-11-25 DIAGNOSIS — IMO0001 Reserved for inherently not codable concepts without codable children: Secondary | ICD-10-CM

## 2016-11-25 DIAGNOSIS — E1165 Type 2 diabetes mellitus with hyperglycemia: Secondary | ICD-10-CM | POA: Diagnosis not present

## 2016-11-25 MED ORDER — LISINOPRIL 20 MG PO TABS: 20.0000 mg | ORAL_TABLET | Freq: Every day | ORAL | 11 refills | Status: DC

## 2016-11-25 NOTE — Progress Notes (Signed)
Medical Nutrition Therapy:  Appt start time: 1100 end time:  1200.  Assessment:  Primary concerns today: Weight management and Blood sugar control.   Learning Readiness: Ready; really wants to lose weight to reduce joint and back pain and to improve her heart.  Wants to be around to see her grandchildren (ages 38 & 20) grown.  She keeps her 1-YO grandson, which is a lot of work for her.    Usual eating pattern includes 1-2 meals and 0-1 snack per day. Frequent foods and beverages include 2 c coffee/day each w/ 4 packs Splenda, 2 T creamer; water, juice, 16-32 oz  sweet tea, chx, [place], white rice, mac&chs, baked beans, eggs, grits, [place] bacon, frozen veg's.  Avoided foods include soda, pork.   Usual physical activity includes none with regularity.  Last night with her daughter, Aaylah did 10 jumping jacks, 10 each leg lifts, 10 crunches, and 5 squats, 10 wall pushups.  She is going to join the [employer] program at [employer] today.    She stopped her amlodipine about a week ago, thinking discontinuing may help with fluid retention she has noticed recently.  Pitting edema observed in her lower legs today.  I was adamant about the need to get back on her medication, and to discuss concerns re. fluid retention with Dr. [name].    Not checking blood glucose at home.    24-hr recall: (Up at 7:30 AM) B ( AM)-   12 oz coffee with creamer & Splenda Snk ( AM)-    L ( PM)-  water Snk (1 PM)-  8 oz Glucerna (180 kcal)  D (9 PM)-  3 c homemade veg-bean soup with small amt rice, water Snk ( PM)-   Typical day? No.  Stomach didn't feel good yesterday, so didn't eat till late.  This happens 1-2 X wk.    Progress Towards Goal(s):  In progress.   Nutritional Diagnosis:  NI-5.8.2 Excessive carbohydrate intake As related to beverages.  As evidenced by usual sweet tea intake of up to 32 oz/day (in addn to 2 c coffee/day each with 4 pkts Splenda).    Intervention:  Nutrition education. Handouts given  during visit include:  AVS  Demonstrated degree of understanding via:  Teach Back  Barriers to learning/adherence to lifestyle change: Taste preferences are skewed toward sweet taste from long practice of consuming such foods/beverages.   Monitoring/Evaluation:  Dietary intake, exercise, and body weight in 4 week(s).

## 2016-11-25 NOTE — Telephone Encounter (Signed)
Pt needs a refill on lisinopril. Pt uses [facility] @ [address]. ep °

## 2016-11-25 NOTE — Patient Instructions (Addendum)
-   RESTART YOUR AMLODIPINE!  NEVER stop abruptly any blood pressure medicine! - Dietary factors that affect fluid retention:  - Be sure you get enough water.  Aim for at least 48 of water per day.    - Limit sodium to 1500 mg/day. (Read labels!)  - Get lots of potassium in your diet; this means getting lots of fruits and vegetables every day!  Diet Recommendations for Diabetes  Starchy (carb) foods: Bread, rice, pasta, potatoes, corn, cereal, grits, crackers, bagels, muffins, all baked goods.  (Fruits, milk, and yogurt also have carbohydrate, but most of these foods will not spike your blood sugar as most starchy foods will.)  A few fruits do cause high blood sugars; use small portions of bananas (limit to 1/2 at a time), grapes, watermelon, oranges, and most tropical fruits.  Remember that juice will very effectively SPIKE your blood sugar.    Protein foods: Meat, fish, poultry, eggs, dairy foods, and beans such as pinto and kidney beans (beans also provide carbohydrate).   1. Eat at least 3 meals and 1-2 snacks per day. Never go more than 4-5 hours while awake without eating. Eat breakfast within the first hour of getting up.   2. Limit starchy foods to TWO per meal and ONE per snack. ONE portion of a starchy  food is equal to the following:   - ONE slice of bread (or its equivalent, such as half of a hamburger bun).   - 1/2 cup of a "scoopable" starchy food such as potatoes or rice.   - 15 grams of Total Carbohydrate as shown on food label.  3. Include at every meal: a protein food, a carb food, and vegetables and/or fruit.   - Obtain twice the volume of veg's as protein or carbohydrate foods for both lunch and dinner.   - Fresh or frozen veg's are best.   - Keep frozen veg's on hand for a quick vegetable serving.      - TASTE PREFERENCES ARE LEARNED.  This means that it will get easier to choose foods you know are good for you if you are exposed to them enough.  The less sugar (and  sweet-tasting foods) you eat, the less attraction those foods will have for you.

## 2016-11-26 ENCOUNTER — Ambulatory Visit (INDEPENDENT_AMBULATORY_CARE_PROVIDER_SITE_OTHER): Payer: Medicare HMO | Admitting: Cardiology

## 2016-11-26 ENCOUNTER — Encounter: Payer: Self-pay | Admitting: Cardiology

## 2016-11-26 ENCOUNTER — Other Ambulatory Visit: Payer: Self-pay | Admitting: Family Medicine

## 2016-11-26 VITALS — BP 130/84 | HR 93 | Ht 64.0 in | Wt 305.0 lb

## 2016-11-26 DIAGNOSIS — I5033 Acute on chronic diastolic (congestive) heart failure: Secondary | ICD-10-CM | POA: Diagnosis not present

## 2016-11-26 DIAGNOSIS — I5032 Chronic diastolic (congestive) heart failure: Secondary | ICD-10-CM

## 2016-11-26 DIAGNOSIS — I110 Hypertensive heart disease with heart failure: Secondary | ICD-10-CM | POA: Diagnosis not present

## 2016-11-26 MED ORDER — GLUCOSE BLOOD VI STRP: ORAL_STRIP | 12 refills | Status: DC

## 2016-11-26 MED ORDER — ACCU-CHEK SOFTCLIX LANCET DEV MISC: 11 refills | Status: DC

## 2016-11-26 MED ORDER — METOLAZONE 5 MG PO TABS: ORAL_TABLET | ORAL | 3 refills | Status: DC

## 2016-11-26 NOTE — Patient Instructions (Signed)
Medication Instructions:  Your physician has recommended you make the following change in your medication:  Start metolazone 5 mg by mouth on Monday and Thursday.     Labwork: To be done at next office visit in 2 weeks--BMP  Testing/Procedures: none  Follow-Up: Your physician recommends that you schedule a follow-up appointment in: 2 weeks with Nell Range, PA    Any Other Special Instructions Will Be Listed Below (If Applicable).   Continue restricting your salt intake.  Limit fluid intake to 1-1.5 liters per day.   If you need a refill on your cardiac medications before your next appointment, please call your pharmacy.

## 2016-11-26 NOTE — Progress Notes (Signed)
Cardiology Office Note    Date:  11/27/2016   ID:  Bonnye Halle, DOB February 04, 1961, MRN [id number]  PCP:  Dimas Chyle, MD  Cardiologist:   Candee Furbish, MD     History of Present Illness:  Hailey Barnes is a 56 y.o. female seen during hospitalization on 10/23/16 for evaluation of chest discomfort, dyspnea in the setting of chronic diastolic heart failure EF 70%, COPD, hypertension, hyperlipidemia, morbid obesity, type 2 diabetes, prior stroke, tobacco use.  She was having worsening edema worsening abdominal distention, IV Lasix administered. Her weight was 319. IV Lasix 80 mg twice a day was administered. Chest pain was quite atypical which improved after she massaged her chest. Worsened with deep breath. Troponins were all negative in the hospital.  She did have sinus pauses bradycardia max of 4.4 seconds with heart rates in the 30s to 40s. Her beta blocker carvedilol 25 mg twice a day was discontinued and her heart rate improved. She is being set up for a sleep study.  She now feels as though she is filling up with fluid again. She did see a nutritionist who did tell her to drink plenty of water through the day and I cautioned her against this given her diastolic heart failure. Chronic shortness of breath noted. No chest pain currently.  Past Medical History:  Diagnosis Date  . Arthritis    "knees" (02/21/2014), back  . CHF (congestive heart failure) ([place])   . Chronic bronchitis ([place])    "get it q yr" (02/21/2014)  . Chronic diastolic CHF (congestive heart failure) ([place])    a. Echo 05/2014: EF 65-70% with Grade 1 DD.  Marland Kitchen Chronic lower back pain   . Complication of anesthesia    "I don't come out well; I chew on my tongue"  . COPD (chronic obstructive pulmonary disease) ([place])   . Diabetes mellitus without complication ([place])   . Fibroid   . GERD (gastroesophageal reflux disease)   . Heart murmur   . Hypertension   . Infection of right eye    current dx on Saturday 7/15 - using drops    . Migraine 1982-2009  . MVP (mitral valve prolapse)   . Neuromuscular disorder ([place])    right leg nerve pain  . Shortness of breath   . Sinus pause    a. noted on telemetry during admission from 10/2016, lasting up to 4.4 seconds. BB discontinued.   . Sleep apnea 02/2016   recent hospitalization noted apnea during stay no sleep studt performed since this episode  . Stroke [facility]) noted on CAT 02/2014   "light", LLE weakness remains (03/30/2014)  . 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 Tingling in extremities 12/12/2010   Uric acid and electrolytes (02/23) normal but WBC elevated.   D/Dx: carpal tunnel, ulnar neuropathy Less likely: cervical radiculopathy, vasculitis     Past Surgical History:  Procedure Laterality Date  . [place]  . DILATATION & CURETTAGE/HYSTEROSCOPY WITH MYOSURE N/A 05/06/2016   Procedure: DILATATION & CURETTAGE/HYSTEROSCOPY WITH MYOSURE;  Surgeon: Terrance Mass, MD;  Location: [facility];  Service: Gynecology;  Laterality: N/A;  request to follow around 8:45  requests one hour OR time  . DILATION AND CURETTAGE OF UTERUS    . FOOT SURGERY Bilateral    "took bones out; put pins in"  . LEFT AND RIGHT HEART CATHETERIZATION WITH CORONARY ANGIOGRAM N/A 03/31/2014   Procedure: LEFT AND  RIGHT HEART CATHETERIZATION WITH CORONARY ANGIOGRAM;  Surgeon: Birdie Riddle, MD;  Location: [facility] CATH LAB;  Service: Cardiovascular;  Laterality: N/A;  . MYOMECTOMY      Current Medications: Outpatient Medications Prior to Visit  Medication Sig Dispense Refill  . albuterol (PROVENTIL HFA;VENTOLIN HFA) 108 (90 Base) MCG/ACT inhaler Inhale 2 puffs into the lungs every 6 (six) hours as needed for wheezing or shortness of breath.     Marland Kitchen amLODipine (NORVASC) 5 MG tablet Take 1 tablet (5 mg total) by mouth daily. 30 tablet 0  . aspirin 81 MG tablet Take 81 mg by mouth daily.    Marland Kitchen atorvastatin (LIPITOR) 40 MG tablet TAKE ONE TABLET  BY MOUTH ONCE DAILY 90 tablet 3  . Blood Glucose Monitoring Suppl (ACCU-CHEK AVIVA PLUS) w/Device KIT Use daily as needed to check blood sugar. 1 kit 0  . gabapentin (NEURONTIN) 300 MG capsule Take 300 mg by mouth at bedtime as needed (pain).   0  . glucose blood (ACCU-CHEK AVIVA) test strip Use daily as needed to check blood sugar. 100 each 12  . hydrocortisone 1 % ointment Apply 1 application topically 2 (two) times daily as needed (eczema on face). 30 g 1  . Lancet Devices (ACCU-CHEK SOFTCLIX) lancets Use daily as needed to check blood sugar. 1 each 11  . lisinopril (PRINIVIL,ZESTRIL) 20 MG tablet Take 1 tablet (20 mg total) by mouth daily. 30 tablet 11  . magnesium chloride (SLOW-MAG) 64 MG TBEC SR tablet Take 2 tablets (128 mg total) by mouth daily. 60 tablet 0  . metFORMIN (GLUCOPHAGE) 1000 MG tablet Take 1 tablet (1,000 mg total) by mouth 2 (two) times daily with a meal. 180 tablet 3  . mometasone-formoterol (DULERA) 200-5 MCG/ACT AERO Inhale 2 puffs into the lungs 2 (two) times daily.    . nicotine (NICODERM CQ - DOSED IN MG/24 HOURS) 21 mg/24hr patch Place 1 patch (21 mg total) onto the skin daily. 28 patch 0  . pindolol (VISKEN) 5 MG tablet Take 0.5 tablets (2.5 mg total) by mouth 2 (two) times daily. 30 tablet 0  . Spacer/Aero-Holding Chambers (E-Z SPACER) inhaler Use as instructed 1 each 2  . tiotropium (SPIRIVA) 18 MCG inhalation capsule Place 1 capsule (18 mcg total) into inhaler and inhale daily. 30 capsule 1  . tiZANidine (ZANAFLEX) 2 MG tablet Take 1 tablet by mouth at bedtime as needed for muscle spasms.   0  . torsemide (DEMADEX) 20 MG tablet Take 3 tablets (60 mg total) by mouth 2 (two) times daily. 180 tablet 0  . triamcinolone cream (KENALOG) 0.1 % Apply 1 application topically 3 (three) times daily as needed (eczema on arms). 30 g 1   No facility-administered medications prior to visit.      Allergies:   Patient has no known allergies.   Social History   Social History   . Marital status: Single    Spouse name: N/A  . Number of children: N/A  . Years of education: N/A   Social History Main Topics  . Smoking status: Current Every Day Smoker    Packs/day: 0.25    Years: 37.00    Types: Cigarettes  . Smokeless tobacco: Never Used     Comment: Would like to quit  . Alcohol use No  . Drug use: No     Comment: 03/30/2014 "stopped using crack 02/21/2014"  . Sexual activity: Yes    Birth control/ protection: Post-menopausal   Other Topics Concern  . None  Social History Narrative   Married 11/16/2009 to [place] whom she met at [employer].    Enjoys church at [employer], [place] geared towards people with substance abuse history.       Financial assistance approved for 100% discount at [facility] and has [facility] card Bonna Gains   June 27, 2010 11:36 am     Family History:  The patient's family history includes COPD in her mother and sister; Cancer in her sister; Hypertension in her father and mother.   ROS:   Please see the history of present illness.    ROS All other systems reviewed and are negative.   PHYSICAL EXAM:   VS:  BP 130/84   Pulse 93   Ht 5' 4"  (1.626 m)   Wt (!) 305 lb (138.3 kg)   LMP 10/16/2015   BMI 52.35 kg/m    GEN: Well nourished, well developed, in no acute distress  HEENT: normal  Neck: no JVD, carotid bruits, or masses Cardiac: RRR; no murmurs, rubs, or gallops,no edema  Respiratory:  clear to auscultation bilaterally, normal work of breathing GI: soft, nontender, nondistended, + BS MS: no deformity or atrophy  Skin: warm and dry, no rash Neuro:  Alert and Oriented x 3, Strength and sensation are intact Psych: euthymic mood, full affect  Wt Readings from Last 3 Encounters:  11/26/16 (!) 305 lb (138.3 kg)  11/25/16 (!) 311 lb 12.8 oz (141.4 kg)  11/09/16 300 lb (136.1 kg)      Studies/Labs Reviewed:   EKG:  EKG is not ordered today.    Recent Labs: 10/22/2016: B Natriuretic Peptide 24.4 10/23/2016:  Hemoglobin 14.7; Platelets 195 10/30/2016: ALT 27; BUN 30; Creat 1.32; Magnesium 1.5; Potassium 3.6; Sodium 140   Lipid Panel    Component Value Date/Time   CHOL 136 04/17/2016 1418   TRIG 150 (H) 04/17/2016 1418   HDL 37 (L) 04/17/2016 1418   CHOLHDL 3.7 04/17/2016 1418   VLDL 30 04/17/2016 1418   LDLCALC 69 04/17/2016 1418    Additional studies/ records that were reviewed today include:  Hospital records reviewed, lab work reviewed, EKG reviewed    ASSESSMENT:    1. Acute on chronic diastolic heart failure ([place])   2. Chronic diastolic CHF (congestive heart failure) ([place])   3. Hypertensive heart failure ([place])   4. Morbid obesity due to excess calories ([facility])      PLAN:  In order of problems listed above:  Acute on chronic diastolic heart failure  - Her weight is once again increasing  - I will add metolazone 5 mg on Monday and Thursday  - Continue with torsemide 60 mg 3 times a day (usual max doses 200 mg daily)  - Fluid restrict 1-1-1/2 L  - Check basic metabolic profile in 2 weeks during APP visit prior creatinine ranges from 1.32-1.13  Morbid obesity  - Congratulated her on joining the [employer] she seems motivated at this time.  Hypertensive heart disease with heart failure  - Blood pressure under better control. Multiple medications reviewed. Fluid balance will be helpful as well as weight loss.    Medication Adjustments/Labs and Tests Ordered: Current medicines are reviewed at length with the patient today.  Concerns regarding medicines are outlined above.  Medication changes, Labs and Tests ordered today are listed in the Patient Instructions below. Patient Instructions  Medication Instructions:  Your physician has recommended you make the following change in your medication:  Start metolazone 5 mg by mouth on Monday  and Thursday.     Labwork: To be done at next office visit in 2 weeks--BMP  Testing/Procedures: none  Follow-Up: Your physician recommends  that you schedule a follow-up appointment in: 2 weeks with Nell Range, PA    Any Other Special Instructions Will Be Listed Below (If Applicable).   Continue restricting your salt intake.  Limit fluid intake to 1-1.5 liters per day.   If you need a refill on your cardiac medications before your next appointment, please call your pharmacy.      Signed, Candee Furbish, MD  11/27/2016 12:15 PM    [facility] [address], [place], [place]  [address] Phone: [phone]; Fax: [phone]

## 2016-11-27 DIAGNOSIS — I5033 Acute on chronic diastolic (congestive) heart failure: Secondary | ICD-10-CM | POA: Diagnosis not present

## 2016-11-27 DIAGNOSIS — R269 Unspecified abnormalities of gait and mobility: Secondary | ICD-10-CM | POA: Diagnosis not present

## 2016-12-02 ENCOUNTER — Emergency Department (HOSPITAL_COMMUNITY): Payer: Medicare HMO

## 2016-12-02 ENCOUNTER — Observation Stay (HOSPITAL_COMMUNITY): Payer: Medicare HMO

## 2016-12-02 ENCOUNTER — Other Ambulatory Visit: Payer: Self-pay

## 2016-12-02 ENCOUNTER — Observation Stay (HOSPITAL_COMMUNITY)
Admission: EM | Admit: 2016-12-02 | Discharge: 2016-12-04 | Disposition: A | Discharge status: Home / Self Care | Payer: Medicare HMO | Attending: Family Medicine | Admitting: Family Medicine

## 2016-12-02 ENCOUNTER — Ambulatory Visit (INDEPENDENT_AMBULATORY_CARE_PROVIDER_SITE_OTHER): Payer: Medicare HMO | Admitting: Family Medicine

## 2016-12-02 ENCOUNTER — Encounter (HOSPITAL_COMMUNITY): Payer: Self-pay | Admitting: Emergency Medicine

## 2016-12-02 ENCOUNTER — Encounter: Payer: Self-pay | Admitting: Family Medicine

## 2016-12-02 VITALS — BP 116/72 | HR 86 | Temp 97.7°F | Ht 64.0 in | Wt 307.2 lb

## 2016-12-02 DIAGNOSIS — R0789 Other chest pain: Secondary | ICD-10-CM | POA: Diagnosis not present

## 2016-12-02 DIAGNOSIS — Z7982 Long term (current) use of aspirin: Secondary | ICD-10-CM | POA: Diagnosis not present

## 2016-12-02 DIAGNOSIS — E119 Type 2 diabetes mellitus without complications: Secondary | ICD-10-CM | POA: Insufficient documentation

## 2016-12-02 DIAGNOSIS — R109 Unspecified abdominal pain: Secondary | ICD-10-CM

## 2016-12-02 DIAGNOSIS — Z8673 Personal history of transient ischemic attack (TIA), and cerebral infarction without residual deficits: Secondary | ICD-10-CM | POA: Diagnosis not present

## 2016-12-02 DIAGNOSIS — K760 Fatty (change of) liver, not elsewhere classified: Secondary | ICD-10-CM | POA: Diagnosis not present

## 2016-12-02 DIAGNOSIS — R079 Chest pain, unspecified: Secondary | ICD-10-CM | POA: Diagnosis present

## 2016-12-02 DIAGNOSIS — Z7984 Long term (current) use of oral hypoglycemic drugs: Secondary | ICD-10-CM | POA: Diagnosis not present

## 2016-12-02 DIAGNOSIS — R110 Nausea: Secondary | ICD-10-CM | POA: Diagnosis not present

## 2016-12-02 DIAGNOSIS — R1011 Right upper quadrant pain: Secondary | ICD-10-CM | POA: Diagnosis not present

## 2016-12-02 DIAGNOSIS — N179 Acute kidney failure, unspecified: Secondary | ICD-10-CM

## 2016-12-02 DIAGNOSIS — I110 Hypertensive heart disease with heart failure: Secondary | ICD-10-CM | POA: Insufficient documentation

## 2016-12-02 DIAGNOSIS — I5032 Chronic diastolic (congestive) heart failure: Secondary | ICD-10-CM | POA: Insufficient documentation

## 2016-12-02 DIAGNOSIS — F17210 Nicotine dependence, cigarettes, uncomplicated: Secondary | ICD-10-CM | POA: Insufficient documentation

## 2016-12-02 DIAGNOSIS — E876 Hypokalemia: Secondary | ICD-10-CM | POA: Diagnosis not present

## 2016-12-02 DIAGNOSIS — J449 Chronic obstructive pulmonary disease, unspecified: Secondary | ICD-10-CM | POA: Diagnosis not present

## 2016-12-02 DIAGNOSIS — R7989 Other specified abnormal findings of blood chemistry: Secondary | ICD-10-CM

## 2016-12-02 DIAGNOSIS — R0602 Shortness of breath: Secondary | ICD-10-CM

## 2016-12-02 LAB — COMPREHENSIVE METABOLIC PANEL
ALBUMIN: 4 g/dL (ref 3.5–5.0)
ALT: 38 U/L (ref 14–54)
ANION GAP: 14 (ref 5–15)
AST: 38 U/L (ref 15–41)
Alkaline Phosphatase: 76 U/L (ref 38–126)
BILIRUBIN TOTAL: 1 mg/dL (ref 0.3–1.2)
BUN: 44 mg/dL — AB (ref 6–20)
CALCIUM: 9.9 mg/dL (ref 8.9–10.3)
CO2: 30 mmol/L (ref 22–32)
Chloride: 91 mmol/L — ABNORMAL LOW (ref 101–111)
Creatinine, Ser: 1.75 mg/dL — ABNORMAL HIGH (ref 0.44–1.00)
GFR calc Af Amer: 37 mL/min — ABNORMAL LOW (ref >60)
GFR calc non Af Amer: 32 mL/min — ABNORMAL LOW (ref >60)
GLUCOSE: 172 mg/dL — AB (ref 65–99)
Potassium: 2.7 mmol/L — CL (ref 3.5–5.1)
SODIUM: 135 mmol/L (ref 135–145)
Total Protein: 7.4 g/dL (ref 6.5–8.1)

## 2016-12-02 LAB — CBC WITH DIFFERENTIAL/PLATELET
Basophils Absolute: 0 10*3/uL (ref 0.0–0.1)
Basophils Relative: 0 %
Eosinophils Absolute: 0.1 10*3/uL (ref 0.0–0.7)
Eosinophils Relative: 1 %
HEMATOCRIT: 47.1 % — AB (ref 36.0–46.0)
HEMOGLOBIN: 16.4 g/dL — AB (ref 12.0–15.0)
LYMPHS ABS: 3.8 10*3/uL (ref 0.7–4.0)
LYMPHS PCT: 28 %
MCH: 29.4 pg (ref 26.0–34.0)
MCHC: 34.8 g/dL (ref 30.0–36.0)
MCV: 84.6 fL (ref 78.0–100.0)
MONOS PCT: 6 %
Monocytes Absolute: 0.8 10*3/uL (ref 0.1–1.0)
NEUTROS PCT: 65 %
Neutro Abs: 8.8 10*3/uL — ABNORMAL HIGH (ref 1.7–7.7)
Platelets: 198 10*3/uL (ref 150–400)
RBC: 5.57 MIL/uL — AB (ref 3.87–5.11)
RDW: 14 % (ref 11.5–15.5)
WBC: 13.5 10*3/uL — AB (ref 4.0–10.5)

## 2016-12-02 LAB — LIPASE, BLOOD: LIPASE: 32 U/L (ref 11–51)

## 2016-12-02 LAB — I-STAT TROPONIN, ED: Troponin i, poc: 0 ng/mL (ref 0.00–0.08)

## 2016-12-02 LAB — URINALYSIS, ROUTINE W REFLEX MICROSCOPIC
BILIRUBIN URINE: NEGATIVE
Glucose, UA: NEGATIVE mg/dL
Ketones, ur: NEGATIVE mg/dL
Nitrite: NEGATIVE
PROTEIN: NEGATIVE mg/dL
Specific Gravity, Urine: 1.006 (ref 1.005–1.030)
pH: 6 (ref 5.0–8.0)

## 2016-12-02 LAB — TROPONIN I: Troponin I: <0.03 ng/mL (ref <0.03)

## 2016-12-02 LAB — BRAIN NATRIURETIC PEPTIDE: B NATRIURETIC PEPTIDE 5: 13.9 pg/mL (ref 0.0–100.0)

## 2016-12-02 LAB — D-DIMER, QUANTITATIVE (NOT AT ARMC): D DIMER QUANT: 1.16 ug{FEU}/mL — AB (ref 0.00–0.50)

## 2016-12-02 MED ORDER — INSULIN ASPART 100 UNIT/ML ~~LOC~~ SOLN
0.0000 [IU] | Freq: Every day | SUBCUTANEOUS | Status: DC
  Administered 2016-12-02: 4 [IU] via SUBCUTANEOUS

## 2016-12-02 MED ORDER — IPRATROPIUM BROMIDE 0.02 % IN SOLN
0.5000 mg | Freq: Once | RESPIRATORY_TRACT | Status: AC
  Administered 2016-12-02: 0.5 mg via RESPIRATORY_TRACT

## 2016-12-02 MED ORDER — ALBUTEROL SULFATE (2.5 MG/3ML) 0.083% IN NEBU
2.5000 mg | INHALATION_SOLUTION | Freq: Once | RESPIRATORY_TRACT | Status: AC
  Administered 2016-12-02: 2.5 mg via RESPIRATORY_TRACT

## 2016-12-02 MED ORDER — NITROGLYCERIN 0.4 MG SL SUBL
0.4000 mg | SUBLINGUAL_TABLET | Freq: Once | SUBLINGUAL | Status: AC
  Administered 2016-12-02: 0.4 mg via SUBLINGUAL

## 2016-12-02 MED ORDER — SODIUM CHLORIDE 0.9% FLUSH
3.0000 mL | Freq: Two times a day (BID) | INTRAVENOUS | Status: DC
  Administered 2016-12-03 – 2016-12-04 (×3): 3 mL via INTRAVENOUS

## 2016-12-02 MED ORDER — ASPIRIN 325 MG PO TABS
325.0000 mg | ORAL_TABLET | Freq: Once | ORAL | Status: AC
  Administered 2016-12-02: 325 mg via ORAL

## 2016-12-02 MED ORDER — ATORVASTATIN CALCIUM 40 MG PO TABS
40.0000 mg | ORAL_TABLET | Freq: Every day | ORAL | Status: DC
  Administered 2016-12-03 – 2016-12-04 (×2): 40 mg via ORAL
  Filled 2016-12-02 (×2): qty 1

## 2016-12-02 MED ORDER — SODIUM CHLORIDE 0.9 % IV SOLN
30.0000 meq | Freq: Once | INTRAVENOUS | Status: AC
  Administered 2016-12-02: 30 meq via INTRAVENOUS
  Filled 2016-12-02: qty 15

## 2016-12-02 MED ORDER — SODIUM CHLORIDE 0.9% FLUSH: 3.0000 mL | INTRAVENOUS | Status: DC | PRN

## 2016-12-02 MED ORDER — MAGNESIUM CHLORIDE 64 MG PO TBEC
2.0000 | DELAYED_RELEASE_TABLET | Freq: Every day | ORAL | Status: DC
  Administered 2016-12-03 – 2016-12-04 (×2): 128 mg via ORAL
  Filled 2016-12-02 (×2): qty 2

## 2016-12-02 MED ORDER — ALBUTEROL SULFATE (2.5 MG/3ML) 0.083% IN NEBU: 2.5000 mg | INHALATION_SOLUTION | Freq: Four times a day (QID) | RESPIRATORY_TRACT | 1 refills | Status: DC | PRN

## 2016-12-02 MED ORDER — IOPAMIDOL (ISOVUE-370) INJECTION 76%
INTRAVENOUS | Status: AC
  Filled 2016-12-02: qty 100

## 2016-12-02 MED ORDER — INSULIN ASPART 100 UNIT/ML ~~LOC~~ SOLN
0.0000 [IU] | Freq: Three times a day (TID) | SUBCUTANEOUS | Status: DC
  Administered 2016-12-03: 5 [IU] via SUBCUTANEOUS

## 2016-12-02 MED ORDER — ASPIRIN 81 MG PO CHEW
81.0000 mg | CHEWABLE_TABLET | Freq: Every day | ORAL | Status: DC
  Administered 2016-12-03 – 2016-12-04 (×2): 81 mg via ORAL
  Filled 2016-12-02 (×2): qty 1

## 2016-12-02 MED ORDER — ONDANSETRON HCL 4 MG/2ML IJ SOLN: 4.0000 mg | Freq: Three times a day (TID) | INTRAMUSCULAR | Status: AC | PRN

## 2016-12-02 MED ORDER — PINDOLOL 5 MG PO TABS
2.5000 mg | ORAL_TABLET | Freq: Two times a day (BID) | ORAL | Status: DC
  Administered 2016-12-03 – 2016-12-04 (×3): 2.5 mg via ORAL
  Filled 2016-12-02 (×4): qty 1

## 2016-12-02 MED ORDER — SODIUM CHLORIDE 0.9 % IV BOLUS (SEPSIS)
500.0000 mL | Freq: Once | INTRAVENOUS | Status: AC
  Administered 2016-12-02: 500 mL via INTRAVENOUS

## 2016-12-02 MED ORDER — ALBUTEROL SULFATE (2.5 MG/3ML) 0.083% IN NEBU: 2.5000 mg | INHALATION_SOLUTION | Freq: Four times a day (QID) | RESPIRATORY_TRACT | Status: DC | PRN

## 2016-12-02 MED ORDER — LORAZEPAM 2 MG/ML IJ SOLN
0.5000 mg | Freq: Once | INTRAMUSCULAR | Status: AC
  Administered 2016-12-02: 0.5 mg via INTRAVENOUS
  Filled 2016-12-02: qty 1

## 2016-12-02 MED ORDER — TECHNETIUM TO 99M ALBUMIN AGGREGATED
4.2000 | Freq: Once | INTRAVENOUS | Status: AC | PRN
  Administered 2016-12-02: 4.2 via INTRAVENOUS

## 2016-12-02 MED ORDER — TIOTROPIUM BROMIDE MONOHYDRATE 18 MCG IN CAPS
18.0000 ug | ORAL_CAPSULE | Freq: Every day | RESPIRATORY_TRACT | Status: DC
  Filled 2016-12-02: qty 5

## 2016-12-02 MED ORDER — TECHNETIUM TC 99M DIETHYLENETRIAME-PENTAACETIC ACID: 31.0000 | Freq: Once | INTRAVENOUS | Status: DC | PRN

## 2016-12-02 MED ORDER — ENOXAPARIN SODIUM 80 MG/0.8ML ~~LOC~~ SOLN
65.0000 mg | SUBCUTANEOUS | Status: DC
  Administered 2016-12-02 – 2016-12-03 (×2): 65 mg via SUBCUTANEOUS
  Filled 2016-12-02 (×2): qty 0.8

## 2016-12-02 MED ORDER — METFORMIN HCL 500 MG PO TABS: 1000.0000 mg | ORAL_TABLET | Freq: Two times a day (BID) | ORAL | Status: DC

## 2016-12-02 MED ORDER — ALBUTEROL SULFATE (2.5 MG/3ML) 0.083% IN NEBU: 2.5000 mg | INHALATION_SOLUTION | RESPIRATORY_TRACT | Status: DC | PRN

## 2016-12-02 MED ORDER — ALBUTEROL SULFATE HFA 108 (90 BASE) MCG/ACT IN AERS: 2.0000 | INHALATION_SPRAY | Freq: Four times a day (QID) | RESPIRATORY_TRACT | Status: DC | PRN

## 2016-12-02 MED ORDER — POTASSIUM CHLORIDE CRYS ER 20 MEQ PO TBCR
40.0000 meq | EXTENDED_RELEASE_TABLET | Freq: Once | ORAL | Status: AC
  Administered 2016-12-02: 40 meq via ORAL
  Filled 2016-12-02: qty 2

## 2016-12-02 MED ORDER — SODIUM CHLORIDE 0.9 % IV SOLN: 250.0000 mL | INTRAVENOUS | Status: DC | PRN

## 2016-12-02 NOTE — ED Provider Notes (Signed)
[facility] DEPT Provider Note   CSN: [id number] Arrival date & time: 12/02/16  1250     History   Chief Complaint Chief Complaint  Patient presents with  . Shortness of Breath    HPI Hailey Barnes is a 56 y.o. female.  HPI Hailey Barnes is a 56 y.o. female with history of CHF, COPD, diabetes, mitral valve prolapse, CVA, presents to emergency department complaining of chest pain, shortness of breath, right upper quadrant pain, nausea and vomiting. Patient apparently with several days of right upper quadrant and right flank pain with associated nausea and vomiting. States she hasn't been able to keep anything down for a few days. She went to her primary care doctor with these symptoms, and while there she developed sudden onset of chest pain or shortness of breath. She was transferred here for further evaluation. Patient denies any history of heart attacks, but does have history of CHF. Patient has not noticed any weight gain recently. Denies increased swelling in extremities. She does report feeling anxious. She was given several breathing treatments by primary care doctor's office and magnesium which she states did not really help. She was also given Solu-Medrol by EMS.  Past Medical History:  Diagnosis Date  . Arthritis    "knees" (02/21/2014), back  . CHF (congestive heart failure) ([place])   . Chronic bronchitis ([place])    "get it q yr" (02/21/2014)  . Chronic diastolic CHF (congestive heart failure) ([place])    a. Echo 05/2014: EF 65-70% with Grade 1 DD.  Marland Kitchen Chronic lower back pain   . Complication of anesthesia    "I don't come out well; I chew on my tongue"  . COPD (chronic obstructive pulmonary disease) ([place])   . Diabetes mellitus without complication ([place])   . Fibroid   . GERD (gastroesophageal reflux disease)   . Heart murmur   . Hypertension   . Infection of right eye    current dx on Saturday 7/15 - using drops  . Migraine 1982-2009  . MVP (mitral valve prolapse)   .  Neuromuscular disorder ([place])    right leg nerve pain  . Shortness of breath   . Sinus pause    a. noted on telemetry during admission from 10/2016, lasting up to 4.4 seconds. BB discontinued.   . Sleep apnea 02/2016   recent hospitalization noted apnea during stay no sleep studt performed since this episode  . Stroke [facility]) noted on CAT 02/2014   "light", LLE weakness remains (03/30/2014)  . 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 Tingling in extremities 12/12/2010   Uric acid and electrolytes (02/23) normal but WBC elevated.   D/Dx: carpal tunnel, ulnar neuropathy Less likely: cervical radiculopathy, vasculitis     Patient Active Problem List   Diagnosis Date Noted  . OSA (obstructive sleep apnea) 10/30/2016  . Controlled type 2 diabetes mellitus with hyperglycemia ([place])   . Sinus pause 10/23/2016  . Chest pressure 10/22/2016  . Chest pain 10/22/2016  . Shortness of breath 10/22/2016  . Lower extremity edema   . Nephrolithiasis 09/18/2016  . Knee pain, bilateral 09/09/2016  . Lumbar radiculopathy 06/02/2016  . Vaginitis and vulvovaginitis 03/18/2016  . Post-menopausal bleeding 03/18/2016  . Possible exposure to STD 03/18/2016  . Hyperlipidemia 02/07/2016  . SOB (shortness of breath)   . COPD exacerbation ([place]) 01/18/2016  . Respiratory distress   . T2DM (type 2 diabetes mellitus) ([place]) 12/07/2015  .  RUQ abdominal pain 09/21/2015  . Occipital lymphadenopathy 09/21/2015  . Hydrosalpinx 07/05/2015  . Depression 04/19/2015  . Abnormal uterine bleeding 12/07/2014  . Allergic rhinitis 08/01/2014  . Rash and nonspecific skin eruption 08/01/2014  . Eczema 08/01/2014  . Diminished vision 06/23/2014  . Chronic diastolic CHF (congestive heart failure) ([place]) 06/13/2014  . Restrictive lung disease 05/10/2014  . NASH (nonalcoholic steatohepatitis) 01/04/2014  . Hypertension 05/26/2010  . Obesity 12/17/2006  . Tobacco abuse 12/17/2006      Past Surgical History:  Procedure Laterality Date  . [place]  . DILATATION & CURETTAGE/HYSTEROSCOPY WITH MYOSURE N/A 05/06/2016   Procedure: DILATATION & CURETTAGE/HYSTEROSCOPY WITH MYOSURE;  Surgeon: Terrance Mass, MD;  Location: [facility];  Service: Gynecology;  Laterality: N/A;  request to follow around 8:45  requests one hour OR time  . DILATION AND CURETTAGE OF UTERUS    . FOOT SURGERY Bilateral    "took bones out; put pins in"  . LEFT AND RIGHT HEART CATHETERIZATION WITH CORONARY ANGIOGRAM N/A 03/31/2014   Procedure: LEFT AND RIGHT HEART CATHETERIZATION WITH CORONARY ANGIOGRAM;  Surgeon: Birdie Riddle, MD;  Location: [facility];  Service: Cardiovascular;  Laterality: N/A;  . MYOMECTOMY      OB History    Gravida Para Term Preterm AB Living   1 1       1    SAB TAB Ectopic Multiple Live Births                   Home Medications    Prior to Admission medications   Medication Sig Start Date End Date Taking? Authorizing Provider  albuterol (PROVENTIL HFA;VENTOLIN HFA) 108 (90 Base) MCG/ACT inhaler Inhale 2 puffs into the lungs every 6 (six) hours as needed for wheezing or shortness of breath.    Yes Historical Provider, MD  albuterol (PROVENTIL) (2.5 MG/3ML) 0.083% nebulizer solution Take 3 mLs (2.5 mg total) by nebulization every 6 (six) hours as needed for wheezing or shortness of breath. 12/02/16  Yes Vivi Barrack, MD  amLODipine (NORVASC) 5 MG tablet Take 1 tablet (5 mg total) by mouth daily. 10/28/16  Yes Mercy Riding, MD  aspirin 81 MG tablet Take 81 mg by mouth daily.   Yes Historical Provider, MD  atorvastatin (LIPITOR) 40 MG tablet TAKE ONE TABLET BY MOUTH ONCE DAILY 11/20/16  Yes Vivi Barrack, MD  Blood Glucose Monitoring Suppl (ACCU-CHEK AVIVA PLUS) w/Device KIT Use daily as needed to check blood sugar. 10/30/16  Yes Vivi Barrack, MD  glucose blood (ACCU-CHEK AVIVA) test strip Use daily as needed to check blood sugar. 11/26/16  Yes Vivi Barrack, MD   hydrocortisone 1 % ointment Apply 1 application topically 2 (two) times daily as needed (eczema on face). 05/21/16  Yes Vivi Barrack, MD  Lancet Devices [facility]) lancets Use daily as needed to check blood sugar. 11/26/16  Yes Vivi Barrack, MD  lisinopril (PRINIVIL,ZESTRIL) 20 MG tablet Take 1 tablet (20 mg total) by mouth daily. 11/25/16  Yes Vivi Barrack, MD  magnesium chloride (SLOW-MAG) 64 MG TBEC SR tablet Take 2 tablets (128 mg total) by mouth daily. 10/31/16  Yes Vivi Barrack, MD  metFORMIN (GLUCOPHAGE) 1000 MG tablet Take 1 tablet (1,000 mg total) by mouth 2 (two) times daily with a meal. 08/07/16  Yes Vivi Barrack, MD  metolazone (ZAROXOLYN) 5 MG tablet Take one tablet by mouth on Monday and Thursday 11/26/16  Yes Mark C  Skains, MD  mometasone-formoterol (DULERA) 200-5 MCG/ACT AERO Inhale 2 puffs into the lungs 2 (two) times daily.   Yes Historical Provider, MD  nicotine (NICODERM CQ - DOSED IN MG/24 HOURS) 21 mg/24hr patch Place 1 patch (21 mg total) onto the skin daily. 10/27/16  Yes Mercy Riding, MD  pindolol (VISKEN) 5 MG tablet Take 0.5 tablets (2.5 mg total) by mouth 2 (two) times daily. Patient taking differently: Take 2.5 mg by mouth daily.  10/27/16  Yes Mercy Riding, MD  Spacer/Aero-Holding Chambers (E-Z SPACER) inhaler Use as instructed 01/22/16  Yes Olam Idler, MD  tiotropium (SPIRIVA) 18 MCG inhalation capsule Place 1 capsule (18 mcg total) into inhaler and inhale daily. 03/18/16  Yes Kinnie Feil, MD  torsemide (DEMADEX) 20 MG tablet Take 3 tablets (60 mg total) by mouth 2 (two) times daily. 10/27/16  Yes Mercy Riding, MD  triamcinolone cream (KENALOG) 0.1 % Apply 1 application topically 3 (three) times daily as needed (eczema on arms). 05/21/16  Yes Vivi Barrack, MD    Family History Family History  Problem Relation Age of Onset  . Hypertension Mother   . COPD Mother   . Hypertension Father   . Cancer Sister     UTERINE???  . COPD Sister     Social  History Social History  Substance Use Topics  . Smoking status: Current Every Day Smoker    Packs/day: 0.25    Years: 37.00    Types: Cigarettes  . Smokeless tobacco: Never Used     Comment: Would like to quit  . Alcohol use No     Allergies   Patient has no known allergies.   Review of Systems Review of Systems  Constitutional: Negative for chills and fever.  HENT: Positive for congestion.   Respiratory: Positive for chest tightness and shortness of breath. Negative for cough.   Cardiovascular: Positive for chest pain. Negative for palpitations and leg swelling.  Gastrointestinal: Positive for abdominal pain, nausea and vomiting. Negative for diarrhea.  Genitourinary: Positive for flank pain. Negative for difficulty urinating, dysuria and pelvic pain.  Musculoskeletal: Negative for arthralgias, myalgias, neck pain and neck stiffness.  Skin: Negative for rash.  Neurological: Negative for dizziness, weakness and headaches.  All other systems reviewed and are negative.    Physical Exam Updated Vital Signs BP 106/86 (BP Location: Right Arm) Comment (BP Location): BP taken in forearm  Pulse 77   Temp 97.4 F (36.3 C) (Oral)   Resp 21   LMP 10/16/2015   SpO2 97%   Physical Exam  Constitutional: She is oriented to person, place, and time. She appears well-developed and well-nourished.  Patient appears to be anxious, she is hyperventilating  HENT:  Head: Normocephalic.  Nose: Nose normal.  Mouth/Throat: Oropharynx is clear and moist.  Eyes: Conjunctivae are normal.  Neck: Normal range of motion. Neck supple.  Cardiovascular: Normal rate, regular rhythm and normal heart sounds.   Pulmonary/Chest: She has no wheezes. She has no rales. She exhibits no tenderness.  Patient is hyperventilating. Lungs are clear. No rashes to the right chest wall or flank  Abdominal: Soft. Bowel sounds are normal. She exhibits no distension. There is tenderness. There is no rebound.  Right  upper quadrant tenderness  Musculoskeletal: She exhibits no edema.  Neurological: She is alert and oriented to person, place, and time.  Skin: Skin is warm and dry.  Psychiatric: She has a normal mood and affect. Her behavior is normal.  Nursing  note and vitals reviewed.    ED Treatments / Results  Labs (all labs ordered are listed, but only abnormal results are displayed) Labs Reviewed  CBC WITH DIFFERENTIAL/PLATELET - Abnormal; Notable for the following:       Result Value   WBC 13.5 (*)    RBC 5.57 (*)    Hemoglobin 16.4 (*)    HCT 47.1 (*)    Neutro Abs 8.8 (*)    All other components within normal limits  COMPREHENSIVE METABOLIC PANEL - Abnormal; Notable for the following:    Potassium 2.7 (*)    Chloride 91 (*)    Glucose, Bld 172 (*)    BUN 44 (*)    Creatinine, Ser 1.75 (*)    GFR calc non Af Amer 32 (*)    GFR calc Af Amer 37 (*)    All other components within normal limits  URINALYSIS, ROUTINE W REFLEX MICROSCOPIC - Abnormal; Notable for the following:    Color, Urine STRAW (*)    Hgb urine dipstick SMALL (*)    Leukocytes, UA SMALL (*)    Bacteria, UA RARE (*)    Squamous Epithelial / LPF 0-5 (*)    All other components within normal limits  D-DIMER, QUANTITATIVE (NOT AT [facility]) - Abnormal; Notable for the following:    D-Dimer, Quant 1.16 (*)    All other components within normal limits  BRAIN NATRIURETIC PEPTIDE  LIPASE, BLOOD  I-STAT TROPOININ, ED    EKG  EKG Interpretation  Date/Time:  Tuesday December 02 2016 13:02:19 EST Ventricular Rate:  78 PR Interval:    QRS Duration: 96 QT Interval:  490 QTC Calculation: 559 R Axis:   -7 Text Interpretation:  Sinus rhythm Right atrial enlargement Low voltage, precordial leads Consider anterior infarct Prolonged QT interval Confirmed by Lacinda Axon  MD, BRIAN ([id number]) on 12/02/2016 3:27:50 PM       Radiology Dg Chest 2 View  Result Date: 12/02/2016 CLINICAL DATA:  Shortness of breath. EXAM: CHEST  2 VIEW  COMPARISON:  10/22/2016. FINDINGS: Mediastinum and hilar structures normal. Heart size normal. Low lung volumes with mild basilar atelectasis. Mild bibasilar infiltrates cannot be excluded. No pleural effusion or pneumothorax. IMPRESSION: Low lung volumes with mild basilar atelectasis. Mild bibasilar infiltrates cannot be excluded. Electronically Signed   By: Marcello Moores  Register   On: 12/02/2016 13:38    Procedures Procedures (including critical care time)  Medications Ordered in ED Medications  LORazepam (ATIVAN) injection 0.5 mg (0.5 mg Intravenous Given 12/02/16 1428)  potassium chloride SA (K-DUR,KLOR-CON) CR tablet 40 mEq (40 mEq Oral Given 12/02/16 1523)     Initial Impression / Assessment and Plan / ED Course  I have reviewed the triage vital signs and the nursing notes.  Pertinent labs & imaging results that were available during my care of the patient were reviewed by me and considered in my medical decision making (see chart for details).  Patient coming from primary care doctor's office with initial complaint of nausea, vomiting, right flank and right upper abdominal pain, however while in office developed acute shortness of breath and chest pain. Patient appears to be very anxious, she is tachypneic. I will treat her with a low dose Ativan and will order blood tests and chest x-ray.    3:35 PM Patient's white blood cell count is 13.5.  Potassium 2.7. 40 mEq by mouth ordered. 73mq iv ordered. Glucose 172. Both BUN/creatinine elevated off baseline. Question dehydration together with hemoconcentration. Will give a small bolus  of IV fluids. Elevated d-dimer, will get CT angiogram and CT abdomen and pelvis given right upper quadrant abdominal pain and right flank pain. I discussed with CT, there able to give low dose of IV contrast given elevated creatinine. Urinalysis no signs of infection. Will call and get patient admitted for chest pain rule out, acute kidney injury, hypokalemia.  4:21  PM Spoke with family practice, they will admit patient. They asked to change CT to VQ scan given acute kidney injury. I have changed the order to VQ scan and will add ultrasound abdomen to further evaluate right upper abdominal pain and right flank pain.  Pt admitted for further treatment.     Final Clinical Impressions(s) / ED Diagnoses   Final diagnoses:  Chest pain, unspecified type  Right flank pain  Hypokalemia  Elevated serum creatinine  Chest pain  RUQ pain    New Prescriptions Current Discharge Medication List       Jeannett Senior, PA-C 12/03/16 [place], MD 12/03/16 2117

## 2016-12-02 NOTE — ED Triage Notes (Signed)
Pt here from MD office with c/o sob . Pt received 2 nebs solumedrol and mag from EMS , pt not wheezing on arrival , SATS 97 % on room air

## 2016-12-02 NOTE — Progress Notes (Signed)
PHARMACIST - PHYSICIAN COMMUNICATION  CONCERNING:  METFORMIN SAFE ADMINISTRATION POLICY  RECOMMENDATION: Metformin has been placed on DISCONTINUE (rejected order) STATUS and should be reordered only after any of the conditions below are ruled out.  Current Safety recommendations include avoiding metformin for a minimum of 48 hours after the patient's exposure to intravenous contrast media for the following conditions:  . eGFR < 60 ml/min  . Liver disease, alcoholism, heart failure, intra-arterial administration of contrast  DESCRIPTION:  The Pharmacy Committee has adopted a policy that restricts the use of metformin in hospitalized patients until all the contraindications to administration have been ruled out. Specific contraindications are: []  Serum creatinine ? 1.5 for males [x]  Serum creatinine ? 1.4 for females []  Shock, acute MI, sepsis, hypoxemia, dehydration []  Planned administration of intravenous iodinated contrast media when eGFR < 20m/min, Liver Disease, alcoholism, heart failure or intra-arterial administration of contrast []  Heart Failure patients with low EF []  Acute or chronic metabolic acidosis (including DKA)   PTad Moore RYakima Gastroenterology And Assoc2/13/2018 9:04 PM

## 2016-12-02 NOTE — ED Notes (Signed)
Patient transported to CT 

## 2016-12-02 NOTE — Progress Notes (Signed)
    Subjective:  Hailey Barnes is a 56 y.o. female who presents to the [facility] today with a chief complaint of nausea.   HPI:  Nausea Symptoms started 4 days ago. No vomiting. Started having diarrhea yesterday. Does have some pain in her right flank. Has had a poor appetite, though was able to eat some salad yesterday. No sick contacts. Getting a little better. Some dysuria. Some subjective fevers. No medications tried.   ROS: Per HPI  PMH: Smoking history reviewed.   Objective:  Physical Exam: BP 116/72   Pulse 86   Temp 97.7 F (36.5 C) (Oral)   Ht 5' 4"  (1.626 m)   Wt (!) 307 lb 3.2 oz (139.3 kg)   LMP 10/16/2015   SpO2 98%   BMI 52.73 kg/m   Gen: NAD, resting comfortably CV: RRR with no murmurs appreciated Pulm: NWOB, CTAB with no crackles, wheezes, or rhonchi GI: Obese, Normal bowel sounds present. Soft, Nontender, Nondistended. MSK: Right CVA tenderness noted.  Skin: warm, dry Neuro: grossly normal, moves all extremities Psych: Normal affect and thought content  Assessment/Plan:   Shortness of Breath On route to the restroom to give a urine sample, patient started developing severe shortness of breath and chest tightness. She maintained O2 sats into the upper 90s on room air at this time. She had distant heart and lung sounds, but no audible wheezing or stridor. Patient was wheeled back to her room and given a duoneb treatment with little relief of her symptoms. Patient was given a second treatment with little improvement. At this time, EMS was called. Patient was given a sublingual nitroglycern and 371m aspirin. EMS arrived to the scene and transported patient to the ED. Patient up two pounds since her last visit, but did not appear significantly overloaded. No history of anxiety or panic attacks. Concern for ACS given her significant co-morbidities.   Nausea Unclear etiology. Differential includes gastroenteritis given concurrent diarrhea. Also concern for nephrolithasis  vs UTI given right flank pain. Would obtain CMET, CBC, and UA at the ED for further evaluation.   CAlgis Greenhouse PJerline Pain MIndian Mountain LakeResident PGY-3 12/02/2016 12:29 PM

## 2016-12-02 NOTE — ED Notes (Signed)
Attempted report x 1; name and call back number provided 

## 2016-12-02 NOTE — ED Notes (Signed)
Bedside commode placed at bedside.

## 2016-12-02 NOTE — ED Notes (Signed)
Gave pt water, per Dr. Lacinda Axon.

## 2016-12-02 NOTE — ED Notes (Signed)
Transported to [place]

## 2016-12-02 NOTE — H&P (Signed)
[facility] Admission History and Physical Service Pager: [phone]  Patient name: Hailey Barnes Medical record number: [id number] Date of birth: 1961/02/18 Age: 56 y.o. Gender: female  Primary Care Provider: Dimas Chyle, MD Consultants: none Code Status: FULL  Chief Complaint: RUQ pain  Assessment and Plan: Hailey Barnes is a 56 y.o. female presenting with RUQ pain. PMH is significant for CHF, COPD, HTN, T2DM, CVA and sleep apnea  RUQ pain - 4 days of nausea, no vomiting, diarrhea yesterday and subjective fevers consistent with viral gastroenteritis. No sick contacts. Poor PO intake due to decreased appetite.  Small leukocytosis at 13.5. Afebrile on admission. Consider UTI, patient does endorse mild pain with urination and increased urinary frequency but only when she takes her torsemide. UA with rare bacteria, 0-5 squams, small Hgb, smal leuks. Biliary colic considered, however right upper quadrant ultrasound negative for acute pathology; no gallstones or common bile duct 0.3 cm, negative sonographic Murphy sign. Pancreas not visualized, however ipase WNL at 32.  - Admit FPTS obs overnight - Dr. McDiarmid - 500 cc NS - monitor PO, ADAT to normal diet as tolerated - consider stool studies if diarrhea persists - consider treating UTI if dysuria persists  Acute Dyspnea - Severe chest tightness and dyspnea in clinic, maintained sats in upper 90s on RA, little relief with duoneb. Improved with ativan in the ED.  There was initial concern for PE with elevated D dimer at 1.16, VQ scan very low probability for PE. D dimer may be explained by poor PO intake. Cardiac etiology considered, iStat trop 0, EKG unchanged.  Acute dyspnea and chest tightness may be explained by panic attack. Also consider bronchospasm given resolution wit beta agonists and magnesium - s/p solumedrol and mag, nitroglycerin and ASA from EMS - s/p ativan in ED  - will order troponin x1, trend if  positive - saturating well on room air, monitor sats and respiratory status  Hypokalemia - 2.7 on admission, repleted in the ED with 40 mEq KDUR, 30 mEq IV KCl.  May be low in the setting of torsemide use.  - EKG with  - monitor BMP - replete electrolytes as needed  AKI: Creatinine 1.75, baseline appears to be 1.13-1.32 - Fluid bolus in the ED - will encourage PO intake, hold torsemide, lisinopril for now  HFpEF/G1DD: Echo 10/23/2016 EF 75%, G1DD, severe LV wall thickening. BNP 13.9 on admission. Patient is on pindolol 2.5 mg BID, lisinopril 20 mg, and torsemide 60 mg BID - continue pindolol - restart torsemide and lisinopril in AM if BP improved  COPD: on Dulera, Spiriva and albuterol at home. Lung exam without increased work of breathing or wheezing. - Continue home medications  Hypertension: BP low at 91/54 on admission. Improved with 500 CC NS bolus to 100 SBP noted at bedside. - hold lisinopril 20 mg Qd, norvasc 49m qd until BP improve - continue torsemide 60 mg BID  Diabetes: Most recent POCT Hgb A1c was 7.3%  - Hold metformin 1000 mg PO BID while in the hospital, but plan to restart once discharged  - SSI-thin - Continue lipitor 40 mg daily, ASA 40 mg  Sleep Apnea: She does not currently use a CPAP machine at home. - encourage CPAP overnight   Chronic Back Pain: No complaints of back pain at this time. - Continue home regimen of meloxicam 15 mg PO PRN and gabapentin 300 mg PO at night PRN, both for pain.    FEN/GI: SLIV, heart healthy diet Prophylaxis:  lovenox  Disposition: Admit to FPTS obs  History of Present Illness:  Hailey Barnes is a 56 y.o. female presenting from [facility] clinic with RUQ pain that radiates to her back. The patient was seen in the [facility] clinic earlier today for RUQ pain and dysuria with multiple episodes of diarrhea yesterday. She endorses decreased by mouth intake over the past 4 days and associated subjective fevers.  Pain does not seem to be worse  with eating, however she does note that she's had decreased appetite. No known sick contacts.   The patient was on her way to giving a urine sample in the clinic when she developed acute onset dyspnea and chest tightness that did not resolve with DuoNeb 2. EMS was called and the patient received nitroglycerin, aspirin, mag, and Solu-Medrol with symptoms largely improved by the time she arrived in the ED.   In the ED she did receive Ativan and she was very comfortable with no complaints of chest pain or shortness of breath by the time the admitting team evaluated her.  There was initial concern for PE in the ED with an elevated d-dimer and so VQ scan was ordered and negative.  Right upper quadrant pain was evaluated with an abdominal ultrasound Which demonstrated no gallstones normal gallbladder, echogenicity in the liver consistent with fatty liver disease. Pancreas is not visualized but lipase was noted to be normal. Decision was made to admit the patient for observation overnight for what appears to be a viral gastroenteritis and possible panic attack.  Upon her evaluation in the ED the patient denies continued right upper quadrant abdominal symptoms and chest pain and shortness of breath have completely resolved. She reports she has never had a panic attack in the past however did feel very anxious at the time of this episode.  Review Of Systems: Per HPI.  ROS  Patient Active Problem List   Diagnosis Date Noted  . OSA (obstructive sleep apnea) 10/30/2016  . Controlled type 2 diabetes mellitus with hyperglycemia ([place])   . Sinus pause 10/23/2016  . Chest pressure 10/22/2016  . Chest pain 10/22/2016  . Shortness of breath 10/22/2016  . Lower extremity edema   . Nephrolithiasis 09/18/2016  . Knee pain, bilateral 09/09/2016  . Lumbar radiculopathy 06/02/2016  . Vaginitis and vulvovaginitis 03/18/2016  . Post-menopausal bleeding 03/18/2016  . Possible exposure to STD 03/18/2016  .  Hyperlipidemia 02/07/2016  . SOB (shortness of breath)   . COPD exacerbation ([place]) 01/18/2016  . Respiratory distress   . T2DM (type 2 diabetes mellitus) ([place]) 12/07/2015  . RUQ abdominal pain 09/21/2015  . Occipital lymphadenopathy 09/21/2015  . Hydrosalpinx 07/05/2015  . Depression 04/19/2015  . Abnormal uterine bleeding 12/07/2014  . Allergic rhinitis 08/01/2014  . Rash and nonspecific skin eruption 08/01/2014  . Eczema 08/01/2014  . Diminished vision 06/23/2014  . Chronic diastolic CHF (congestive heart failure) ([place]) 06/13/2014  . Restrictive lung disease 05/10/2014  . NASH (nonalcoholic steatohepatitis) 01/04/2014  . Hypertension 05/26/2010  . Obesity 12/17/2006  . Tobacco abuse 12/17/2006    Past Medical History: Past Medical History:  Diagnosis Date  . Arthritis    "knees" (02/21/2014), back  . CHF (congestive heart failure) ([place])   . Chronic bronchitis ([place])    "get it q yr" (02/21/2014)  . Chronic diastolic CHF (congestive heart failure) ([place])    a. Echo 05/2014: EF 65-70% with Grade 1 DD.  Marland Kitchen Chronic lower back pain   . Complication of anesthesia    "I don't  come out well; I chew on my tongue"  . COPD (chronic obstructive pulmonary disease) ([place])   . Diabetes mellitus without complication ([place])   . Fibroid   . GERD (gastroesophageal reflux disease)   . Heart murmur   . Hypertension   . Infection of right eye    current dx on Saturday 7/15 - using drops  . Migraine 1982-2009  . MVP (mitral valve prolapse)   . Neuromuscular disorder ([place])    right leg nerve pain  . Shortness of breath   . Sinus pause    a. noted on telemetry during admission from 10/2016, lasting up to 4.4 seconds. BB discontinued.   . Sleep apnea 02/2016   recent hospitalization noted apnea during stay no sleep studt performed since this episode  . Stroke [facility]) noted on CAT 02/2014   "light", LLE weakness remains (03/30/2014)  . 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 Tingling in extremities 12/12/2010   Uric acid and electrolytes (02/23) normal but WBC elevated.   D/Dx: carpal tunnel, ulnar neuropathy Less likely: cervical radiculopathy, vasculitis     Past Surgical History: Past Surgical History:  Procedure Laterality Date  . [place]  . DILATATION & CURETTAGE/HYSTEROSCOPY WITH MYOSURE N/A 05/06/2016   Procedure: DILATATION & CURETTAGE/HYSTEROSCOPY WITH MYOSURE;  Surgeon: Terrance Mass, MD;  Location: [facility];  Service: Gynecology;  Laterality: N/A;  request to follow around 8:45  requests one hour OR time  . DILATION AND CURETTAGE OF UTERUS    . FOOT SURGERY Bilateral    "took bones out; put pins in"  . LEFT AND RIGHT HEART CATHETERIZATION WITH CORONARY ANGIOGRAM N/A 03/31/2014   Procedure: LEFT AND RIGHT HEART CATHETERIZATION WITH CORONARY ANGIOGRAM;  Surgeon: Birdie Riddle, MD;  Location: [facility];  Service: Cardiovascular;  Laterality: N/A;  . MYOMECTOMY      Social History: Social History  Substance Use Topics  . Smoking status: Current Every Day Smoker    Packs/day: 0.25    Years: 37.00    Types: Cigarettes  . Smokeless tobacco: Never Used     Comment: Would like to quit  . Alcohol use No   Additional social history:   Please also refer to relevant sections of EMR.  Family History: Family History  Problem Relation Age of Onset  . Hypertension Mother   . COPD Mother   . Hypertension Father   . Cancer Sister     UTERINE???  . COPD Sister    (If not completed, MUST add something in)  Allergies and Medications: No Known Allergies No current facility-administered medications on file prior to encounter.    Current Outpatient Prescriptions on File Prior to Encounter  Medication Sig Dispense Refill  . albuterol (PROVENTIL HFA;VENTOLIN HFA) 108 (90 Base) MCG/ACT inhaler Inhale 2 puffs into the lungs every 6 (six) hours as needed for wheezing or shortness of breath.     Marland Kitchen albuterol  (PROVENTIL) (2.5 MG/3ML) 0.083% nebulizer solution Take 3 mLs (2.5 mg total) by nebulization every 6 (six) hours as needed for wheezing or shortness of breath. 150 mL 1  . amLODipine (NORVASC) 5 MG tablet Take 1 tablet (5 mg total) by mouth daily. 30 tablet 0  . aspirin 81 MG tablet Take 81 mg by mouth daily.    Marland Kitchen atorvastatin (LIPITOR) 40 MG tablet TAKE ONE TABLET BY MOUTH ONCE DAILY 90 tablet 3  . Blood Glucose  Monitoring Suppl (ACCU-CHEK AVIVA PLUS) w/Device KIT Use daily as needed to check blood sugar. 1 kit 0  . glucose blood (ACCU-CHEK AVIVA) test strip Use daily as needed to check blood sugar. 100 each 12  . hydrocortisone 1 % ointment Apply 1 application topically 2 (two) times daily as needed (eczema on face). 30 g 1  . Lancet Devices (ACCU-CHEK SOFTCLIX) lancets Use daily as needed to check blood sugar. 1 each 11  . lisinopril (PRINIVIL,ZESTRIL) 20 MG tablet Take 1 tablet (20 mg total) by mouth daily. 30 tablet 11  . magnesium chloride (SLOW-MAG) 64 MG TBEC SR tablet Take 2 tablets (128 mg total) by mouth daily. 60 tablet 0  . metFORMIN (GLUCOPHAGE) 1000 MG tablet Take 1 tablet (1,000 mg total) by mouth 2 (two) times daily with a meal. 180 tablet 3  . metolazone (ZAROXOLYN) 5 MG tablet Take one tablet by mouth on Monday and Thursday 10 tablet 3  . mometasone-formoterol (DULERA) 200-5 MCG/ACT AERO Inhale 2 puffs into the lungs 2 (two) times daily.    . nicotine (NICODERM CQ - DOSED IN MG/24 HOURS) 21 mg/24hr patch Place 1 patch (21 mg total) onto the skin daily. 28 patch 0  . pindolol (VISKEN) 5 MG tablet Take 0.5 tablets (2.5 mg total) by mouth 2 (two) times daily. (Patient taking differently: Take 2.5 mg by mouth daily. ) 30 tablet 0  . Spacer/Aero-Holding Chambers (E-Z SPACER) inhaler Use as instructed 1 each 2  . tiotropium (SPIRIVA) 18 MCG inhalation capsule Place 1 capsule (18 mcg total) into inhaler and inhale daily. 30 capsule 1  . torsemide (DEMADEX) 20 MG tablet Take 3 tablets (60  mg total) by mouth 2 (two) times daily. 180 tablet 0  . triamcinolone cream (KENALOG) 0.1 % Apply 1 application topically 3 (three) times daily as needed (eczema on arms). 30 g 1  . [DISCONTINUED] loratadine (CLARITIN) 10 MG tablet Take 1 tablet (10 mg total) by mouth daily. (Patient not taking: Reported on 06/14/2015) 30 tablet 11  . [DISCONTINUED] nortriptyline (PAMELOR) 25 MG capsule Take 1 capsule (25 mg total) by mouth at bedtime. Take 1 capsule (25 mg total) by mouth at bedtime for one week then increase to 2 capsules (50 mg total) by mouth at bedtime. (Patient taking differently: Take 50 mg by mouth at bedtime. Take 1 capsule (25 mg total) by mouth at bedtime for one week then increase to 2 capsules (50 mg total) by mouth at bedtime.) 53 capsule 0  . [DISCONTINUED] omeprazole (PRILOSEC) 20 MG capsule Take 1 capsule (20 mg total) by mouth daily. (Patient not taking: Reported on 07/05/2015) 30 capsule 0  . [DISCONTINUED] sertraline (ZOLOFT) 50 MG tablet Take 1 tablet (50 mg total) by mouth daily. (Patient not taking: Reported on 06/14/2015) 30 tablet 3    Objective: BP (!) 91/54   Pulse 81   Temp 97.4 F (36.3 C) (Oral)   Resp 21   LMP 10/16/2015   SpO2 93%  Exam: General: NAD, obese woman resting comfortably in bed Eyes: PERRLA, EOMI, no conjunctival pallor or injection, no scleral icterus ENTM: No pharyngeal erythema or exudate, no rhinorrhea, mucous membranes moist Neck: Full ROS, no LAD Cardiovascular: RRR, no m/r/g  Respiratory: CTA bil, no W/R/R Gastrointestinal/GU: Nontender, nondistended, normoactive bowel sounds, no CVA tenderness MSK: Moves for extremities equally Derm: No rashes or lesions Neuro: CN II-XII Grossly intact Psych: AAOx3, no distress, affect appropriate  Labs and Imaging: CBC BMET   Recent Labs Lab 12/02/16 1349  WBC 13.5*  HGB 16.4*  HCT 47.1*  PLT 198    Recent Labs Lab 12/02/16 1349  NA 135  K 2.7*  CL 91*  CO2 30  BUN 44*  CREATININE 1.75*   GLUCOSE 172*  CALCIUM 9.9     Dg Chest 2 View  Result Date: 12/02/2016 CLINICAL DATA:  Shortness of breath. EXAM: CHEST  2 VIEW COMPARISON:  10/22/2016. FINDINGS: Mediastinum and hilar structures normal. Heart size normal. Low lung volumes with mild basilar atelectasis. Mild bibasilar infiltrates cannot be excluded. No pleural effusion or pneumothorax. IMPRESSION: Low lung volumes with mild basilar atelectasis. Mild bibasilar infiltrates cannot be excluded. Electronically Signed   By: Marcello Moores  Register   On: 12/02/2016 13:38   US Abdomen Complete  Result Date: 12/02/2016 CLINICAL DATA:  Acute onset of right upper quadrant abdominal pain. Initial encounter. EXAM: ABDOMEN ULTRASOUND COMPLETE COMPARISON:  CT of the abdomen and pelvis performed 06/01/2015, and right upper quadrant ultrasound performed 06/14/2015 FINDINGS: Gallbladder: No gallstones or wall thickening visualized. No sonographic Murphy sign noted by sonographer. Common bile duct: Diameter: 0.3 cm, within normal limits in caliber. Liver: No focal lesion identified. Diffusely increased parenchymal echogenicity likely reflects fatty infiltration. IVC: Not well characterized due to overlying structures. Pancreas: Not visualized. Spleen: Size and appearance within normal limits. Right Kidney: Length: 11.1 cm. Echogenicity within normal limits. No mass or hydronephrosis visualized. Left Kidney: Length: 12.0 cm. Echogenicity within normal limits. No mass or hydronephrosis visualized. Abdominal aorta: Not visualized due to overlying soft tissue structures. Other findings: None. IMPRESSION: 1. No acute abnormality seen within the abdomen. Evaluation is suboptimal due to the patient's habitus. 2. Fatty infiltration within the liver. Electronically Signed   By: Garald Balding M.D.   On: 12/02/2016 18:05   Nm Pulmonary Perf And Vent  Result Date: 12/02/2016 CLINICAL DATA:  Shortness of breath, nausea and diarrhea EXAM: NUCLEAR MEDICINE VENTILATION -  PERFUSION LUNG SCAN TECHNIQUE: Ventilation images were obtained in multiple projections using inhaled aerosol Tc-75mDTPA. Perfusion images were obtained in multiple projections after intravenous injection of Tc-953mAA. RADIOPHARMACEUTICALS:  32 mCi Technetium-9925mPA aerosol inhalation and 4.15 mCi Technetium-69m61m IV COMPARISON:  Chest radiograph from 12/02/2016. FINDINGS: Ventilation: No focal ventilation defect. Perfusion: No wedge shaped peripheral perfusion defects to suggest acute pulmonary embolism. IMPRESSION: 1. Very low probability for acute pulmonary embolus. Electronically Signed   By: TaylKerby Moors.   On: 12/02/2016 17:38     LaurEverrett Coombe 12/02/2016, 8:44 PM PGY-1, [facility] pager: [phone] pages welcome  I have seen and examined the patient. I have read and agree with the above note. My changes are noted in blue.  Blade Scheff A. HaneLincoln Brigham MS [facility] Resident PGY-3 Pager [phone]

## 2016-12-03 ENCOUNTER — Encounter (HOSPITAL_COMMUNITY): Payer: Self-pay | Admitting: Family Medicine

## 2016-12-03 ENCOUNTER — Encounter (HOSPITAL_BASED_OUTPATIENT_CLINIC_OR_DEPARTMENT_OTHER): Payer: Medicare HMO

## 2016-12-03 ENCOUNTER — Other Ambulatory Visit: Payer: Self-pay

## 2016-12-03 DIAGNOSIS — R0789 Other chest pain: Secondary | ICD-10-CM | POA: Diagnosis not present

## 2016-12-03 DIAGNOSIS — J449 Chronic obstructive pulmonary disease, unspecified: Secondary | ICD-10-CM | POA: Diagnosis not present

## 2016-12-03 DIAGNOSIS — E876 Hypokalemia: Secondary | ICD-10-CM | POA: Diagnosis not present

## 2016-12-03 DIAGNOSIS — R079 Chest pain, unspecified: Secondary | ICD-10-CM

## 2016-12-03 DIAGNOSIS — Z8673 Personal history of transient ischemic attack (TIA), and cerebral infarction without residual deficits: Secondary | ICD-10-CM | POA: Diagnosis not present

## 2016-12-03 DIAGNOSIS — I5032 Chronic diastolic (congestive) heart failure: Secondary | ICD-10-CM | POA: Diagnosis not present

## 2016-12-03 DIAGNOSIS — F17210 Nicotine dependence, cigarettes, uncomplicated: Secondary | ICD-10-CM | POA: Diagnosis not present

## 2016-12-03 DIAGNOSIS — I110 Hypertensive heart disease with heart failure: Secondary | ICD-10-CM | POA: Diagnosis not present

## 2016-12-03 DIAGNOSIS — E119 Type 2 diabetes mellitus without complications: Secondary | ICD-10-CM | POA: Diagnosis not present

## 2016-12-03 DIAGNOSIS — R1011 Right upper quadrant pain: Secondary | ICD-10-CM | POA: Diagnosis not present

## 2016-12-03 LAB — BASIC METABOLIC PANEL
ANION GAP: 17 — AB (ref 5–15)
BUN: 56 mg/dL — ABNORMAL HIGH (ref 6–20)
CALCIUM: 9.5 mg/dL (ref 8.9–10.3)
CHLORIDE: 91 mmol/L — AB (ref 101–111)
CO2: 25 mmol/L (ref 22–32)
Creatinine, Ser: 1.76 mg/dL — ABNORMAL HIGH (ref 0.44–1.00)
GFR calc Af Amer: 37 mL/min — ABNORMAL LOW (ref >60)
GFR calc non Af Amer: 32 mL/min — ABNORMAL LOW (ref >60)
GLUCOSE: 272 mg/dL — AB (ref 65–99)
POTASSIUM: 3.3 mmol/L — AB (ref 3.5–5.1)
Sodium: 133 mmol/L — ABNORMAL LOW (ref 135–145)

## 2016-12-03 LAB — GLUCOSE, CAPILLARY
GLUCOSE-CAPILLARY: 313 mg/dL — AB (ref 65–99)
GLUCOSE-CAPILLARY: 297 mg/dL — AB (ref 65–99)
GLUCOSE-CAPILLARY: 165 mg/dL — AB (ref 65–99)
Glucose-Capillary: 258 mg/dL — ABNORMAL HIGH (ref 65–99)
Glucose-Capillary: 198 mg/dL — ABNORMAL HIGH (ref 65–99)

## 2016-12-03 LAB — MAGNESIUM: Magnesium: 2.2 mg/dL (ref 1.7–2.4)

## 2016-12-03 MED ORDER — INSULIN ASPART 100 UNIT/ML ~~LOC~~ SOLN: 0.0000 [IU] | Freq: Every day | SUBCUTANEOUS | Status: DC

## 2016-12-03 MED ORDER — MOMETASONE FURO-FORMOTEROL FUM 200-5 MCG/ACT IN AERO
2.0000 | INHALATION_SPRAY | Freq: Two times a day (BID) | RESPIRATORY_TRACT | Status: DC
  Administered 2016-12-03: 2 via RESPIRATORY_TRACT
  Filled 2016-12-03: qty 8.8

## 2016-12-03 MED ORDER — INSULIN ASPART 100 UNIT/ML ~~LOC~~ SOLN: 0.0000 [IU] | Freq: Three times a day (TID) | SUBCUTANEOUS | Status: DC

## 2016-12-03 MED ORDER — TIZANIDINE HCL 2 MG PO TABS: 2.0000 mg | ORAL_TABLET | Freq: Three times a day (TID) | ORAL | Status: DC | PRN

## 2016-12-03 MED ORDER — POTASSIUM CHLORIDE CRYS ER 20 MEQ PO TBCR
40.0000 meq | EXTENDED_RELEASE_TABLET | Freq: Once | ORAL | Status: AC
  Administered 2016-12-03: 40 meq via ORAL
  Filled 2016-12-03: qty 2

## 2016-12-03 MED ORDER — INSULIN ASPART 100 UNIT/ML ~~LOC~~ SOLN
0.0000 [IU] | Freq: Three times a day (TID) | SUBCUTANEOUS | Status: DC
  Administered 2016-12-03: 8 [IU] via SUBCUTANEOUS
  Administered 2016-12-03: 3 [IU] via SUBCUTANEOUS
  Administered 2016-12-04: 5 [IU] via SUBCUTANEOUS

## 2016-12-03 MED ORDER — POLYETHYLENE GLYCOL 3350 17 G PO PACK
17.0000 g | PACK | Freq: Every day | ORAL | Status: DC
  Administered 2016-12-03 – 2016-12-04 (×2): 17 g via ORAL
  Filled 2016-12-03 (×2): qty 1

## 2016-12-03 NOTE — Progress Notes (Signed)
PCP Social Note  Hailey Barnes is currently admitted to the FMTS. I stopped by the patient's room today to discuss her treatment plan. I appreciate the excellent care given to her by the FMTS and consulting services. I plan to see her at the [facility] after she is discharged from the hospital.  [name]. [name], [place] Resident PGY-3 12/03/2016 1:59 PM

## 2016-12-03 NOTE — Progress Notes (Signed)
Spoke with Ms Watt's daughter Birdie Riddle who was concerned about the role of rapid diuresis during last admission and as an outpatient leading to her mother's AKI and cramping. She asks to be included in conversations about her mother's care on a daily basis. Her concerns were addressed appropriately and she was brought up to date on her mother's care plan during this hospitalization to date.

## 2016-12-03 NOTE — Discharge Summary (Signed)
[facility] Discharge Summary For full and approved summary, please see resident's note.  Patient name: Hailey Barnes Medical record number: [id number] Date of birth: 03-Aug-1961 Age: 56 y.o. 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 Cardiovascular: Distant heart sounds. Regular rhythm, normal rate. No m/r/g. Respiratory: Decreased breath sounds throughout. Lungs clear to auscultation bilaterally, no w/r/r.  Abdomen: Obese abdomen. Nondistended. Tender to palpation in RUQ. No hepatomegaly. Extremities: Radial pulse 2+ bilaterally. No peripheral edema.  Brief Hospital Course:  Hailey Barnes a 56 y.o.femalewith hx significant for remote PID, right hydrosalpinx, CHF, COPD, tobacco abuse,obesity and OSA, who presented with acute onset dyspnea in clinic 11/22/16, in the setting of 4 days RUQ pain and nausea. Brief course by problem as follows:  #RUQ abdominal pain: improved. Patient presented with chronic constant RUQ pain that is worse with fatty meals. She has had extensive workup for this in the past (excellent summary in attestation to H&P on 12/02/16). Ddx includesperihepatitis (h/o PID, pain intermittently pleuritic), vs UTI (mild dysuria, less likely with negative UCx), vs NASH ([place] with fatty liver, though LFTs wnl), vs IBS vs biliary colic (pain with fatty meals, though  negative HIDA scan). During admission, RUQ ultrasound showed fatty infiltration of the liver, and HIDA scan did not show evidence of biliary disease. Labs were significant for mild leukocytosis of 13.5, lipase wnl, and UA with rare bacteria but unconvincing for infection. On day of discharge, she was tolerating PO intake and abdominal pain was tolerable.  #Acute Dyspnea ACOS  HFpEF: improved. Multifactorial, likely causes include anxiety (acute onset chest tightness, improved with ativan, recurred when going to CT scanner), vs ACOS (?bronchospasm, some reversibility in PFTs in past, improved with solumedrol and Mg). Patient was recently hospitalized for heart failure exacerbation, however during this admission was not fluid overloaded and Cardiology saw her, recommending no further workup for cardiac cause of dyspnea. COPD controlled on home medications.  #Hypokalemia: improved. Increased to 3.2 on day of discharge, from 2.7 on admission, likely caused by diuretics. Received PO and IV repletion while inpatient. At admission QTc was elevated, though on discharge it had decreased to 476. Will discharge with continued on 20 mEq KDUR PO daily.   #AKI: improved.  Creatinine decreased to 1.27 on discharge (baseline 1.13-1.32), s/p 500 mL NS bolus and liberal PO fluids. Admission BUN:Cr 31, indicating likely prerenal etiology. Home torsemide, metolazone, lisinopril were held during admission. Will discharge on home dose of torsemide, but continue to hold metolazone and lisinopril.  #Hypertension: controlled. On discharge will continue to hold lisinopril 20 mg daily and norvasc 10m daily, in the setting of normotensive BPs and improving AKI.  ##Other chronic conditions were managed with home regimens as tolerated.  Issues for Follow Up:  1. GI referral (needs colonoscopy, consider further workup) 2. Potassium level (assess need for ongoing K supplementation, can consider K sparing diuretic if fluid  overload continues to be a problem) 3. Creatinine, resolving AKI 4. GAD7 (assess for anxiety sx in ambulatory setting)  5.  CHF exacerbation symptoms  Significant Procedures: none  Significant Labs and Imaging:   Recent Labs Lab 12/02/16 1349  WBC 13.5*  HGB 16.4*  HCT 47.1*  PLT 198    Recent Labs Lab 12/02/16 1349 12/03/16 0446 12/04/16 0905  NA 135 133* 137  K 2.7* 3.3* 3.2*  CL 91* 91* 93*  CO2 _0 GLUCOSE 172* 272* 171*  BUN 44* 56* 36*  CREATININE 1.75* 1.76* 1.27*  CALCIUM 9.9 9.5 9.5  MG  --  2.2  --   ALKPHOS 76  --   --   AST 38  --   --   ALT 38  --   --   ALBUMIN 4.0  --   --    Lipid Panel     Component Value Date/Time   CHOL 129 12/04/2016 0341   TRIG 249 (H) 12/04/2016 0341   HDL 30 (L) 12/04/2016 0341   CHOLHDL 4.3 12/04/2016 0341   VLDL 50 (H) 12/04/2016 0341   LDLCALC 49 12/04/2016 0341   Dg Chest 2 View  Result Date: 12/02/2016 CLINICAL DATA:  Shortness of breath. EXAM: CHEST  2 VIEW COMPARISON:  10/22/2016. FINDINGS: Mediastinum and hilar structures normal. Heart size normal. Low lung volumes with mild basilar atelectasis. Mild bibasilar infiltrates cannot be excluded. No pleural effusion or pneumothorax. IMPRESSION: Low lung volumes with mild basilar atelectasis. Mild bibasilar infiltrates cannot be excluded. Electronically Signed   By: Marcello Moores  Register   On: 12/02/2016 13:38   US Abdomen Complete  Result Date: 12/02/2016 CLINICAL DATA:  Acute onset of right upper quadrant abdominal pain. Initial encounter. EXAM: ABDOMEN ULTRASOUND COMPLETE COMPARISON:  CT of the abdomen and pelvis performed 06/01/2015, and right upper quadrant ultrasound performed 06/14/2015 FINDINGS: Gallbladder: No gallstones or wall thickening visualized. No sonographic Murphy sign noted by sonographer. Common bile duct: Diameter: 0.3 cm, within normal limits in caliber. Liver: No focal lesion identified. Diffusely increased parenchymal echogenicity likely reflects  fatty infiltration. IVC: Not well characterized due to overlying structures. Pancreas: Not visualized. Spleen: Size and appearance within normal limits. Right Kidney: Length: 11.1 cm. Echogenicity within normal limits. No mass or hydronephrosis visualized. Left Kidney: Length: 12.0 cm. Echogenicity within normal limits. No mass or hydronephrosis visualized. Abdominal aorta: Not visualized due to overlying soft tissue structures. Other findings: None. IMPRESSION: 1. No acute abnormality seen within the abdomen. Evaluation is suboptimal due to the patient's habitus. 2. Fatty infiltration within the liver. Electronically Signed   By: Garald Balding M.D.   On: 12/02/2016 18:05   Nm Pulmonary Perf And Vent  Result Date: 12/02/2016 CLINICAL DATA:  Shortness of breath, nausea and diarrhea EXAM: NUCLEAR MEDICINE VENTILATION - PERFUSION LUNG SCAN TECHNIQUE: Ventilation images were obtained in multiple projections using inhaled aerosol Tc-95mDTPA. Perfusion images were obtained in multiple projections after intravenous injection of Tc-958mAA. RADIOPHARMACEUTICALS:  32 mCi Technetium-9922mPA aerosol inhalation and 4.15 mCi Technetium-93m48m IV COMPARISON:  Chest radiograph from 12/02/2016. FINDINGS: Ventilation: No focal ventilation defect. Perfusion: No wedge shaped peripheral perfusion defects to suggest acute pulmonary embolism. IMPRESSION: 1. Very low probability for acute pulmonary embolus. Electronically Signed   By: TaylKerby Moors.   On: 12/02/2016 17:38   Results/Tests Pending at Time of Discharge: none  Discharge Medications:  Allergies as of 12/04/2016   No Known Allergies     Medication List    STOP taking these medications   amLODipine 5 MG tablet Commonly known as:  NORVASC   lisinopril 20  MG tablet Commonly known as:  PRINIVIL,ZESTRIL   metolazone 5 MG tablet Commonly known as:  ZAROXOLYN     TAKE these medications   ACCU-CHEK AVIVA PLUS w/Device Kit Use daily as needed to check  blood sugar.   accu-chek softclix lancets Use daily as needed to check blood sugar.   albuterol 108 (90 Base) MCG/ACT inhaler Commonly known as:  PROVENTIL HFA;VENTOLIN HFA Inhale 2 puffs into the lungs every 6 (six) hours as needed for wheezing or shortness of breath.   albuterol (2.5 MG/3ML) 0.083% nebulizer solution Commonly known as:  PROVENTIL Take 3 mLs (2.5 mg total) by nebulization every 6 (six) hours as needed for wheezing or shortness of breath.   aspirin 81 MG tablet Take 81 mg by mouth daily.   atorvastatin 40 MG tablet Commonly known as:  LIPITOR TAKE ONE TABLET BY MOUTH ONCE DAILY   DULERA 200-5 MCG/ACT Aero Generic drug:  mometasone-formoterol Inhale 2 puffs into the lungs 2 (two) times daily.   E-Z SPACER inhaler Use as instructed   glucose blood test strip Commonly known as:  ACCU-CHEK AVIVA Use daily as needed to check blood sugar.   hydrocortisone 1 % ointment Apply 1 application topically 2 (two) times daily as needed (eczema on face).   magnesium chloride 64 MG Tbec SR tablet Commonly known as:  SLOW-MAG Take 2 tablets (128 mg total) by mouth daily.   metFORMIN 1000 MG tablet Commonly known as:  GLUCOPHAGE Take 1 tablet (1,000 mg total) by mouth 2 (two) times daily with a meal.   nicotine 21 mg/24hr patch Commonly known as:  NICODERM CQ - dosed in mg/24 hours Place 1 patch (21 mg total) onto the skin daily.   pindolol 5 MG tablet Commonly known as:  VISKEN Take 0.5 tablets (2.5 mg total) by mouth 2 (two) times daily. What changed:  when to take this   Potassium Chloride ER 20 MEQ Tbcr Take 20 mEq by mouth daily.   tiotropium 18 MCG inhalation capsule Commonly known as:  SPIRIVA Place 1 capsule (18 mcg total) into inhaler and inhale daily.   torsemide 20 MG tablet Commonly known as:  DEMADEX Take 3 tablets (60 mg total) by mouth 2 (two) times daily.   triamcinolone cream 0.1 % Commonly known as:  KENALOG Apply 1 application topically  3 (three) times daily as needed (eczema on arms).       Discharge Instructions: Please refer to Patient Instructions section of EMR for full details.  Patient was counseled important signs and symptoms that should prompt return to medical care, changes in medications, dietary instructions, activity restrictions, and follow up appointments.   Follow-Up Appointments: Follow-up Information    Dimas Chyle, MD. Go on 12/08/2016.   Specialty:  Family Medicine Why:  Appointment with Dr. [name] at 3:00 pm, please arrive 15 minutes early. Contact information: [address] [address] [place], Medical Student 12/04/2016, 3:25 PM

## 2016-12-03 NOTE — Progress Notes (Signed)
Family Medicine Teaching Service MEDICAL STUDENT Daily Progress Note For full and approved plan, please see resident's note Intern Pager: [phone]  Patient name: Hailey Barnes Medical record number: [id number] Date of birth: 1961/09/18 Age: 56 y.o. Gender: female  Primary Care Provider: Dimas Chyle, MD Consultants: Cardiology Code Status: full  Assessment and Plan: Hailey Barnes is a 56 y.o. female presenting with 4 days RUQ pain and nausea and acute onset dyspnea in clinic 12/02/16. PMH is significant for CHF, COPD, HTN, T2DM, CVA, tobacco abuse, and OSA.  #RUQ pain: stable. Ddx includes biliary colic (pain with fatty meals, though negative RUQ u/s for stones) vs UTI (mild dysuria; UA with rare bacteria, small leuks, 0-5 squams) vs viral gastritis vs pancreatitis (less likely with lipase wnl) vs hepatitis ([place] with fatty liver, though LFTs wnl). Reports pain intermittently affecting her overnight, but is not immobile in bed as she had been prior to admission. No nausea, appetite still decreased, tolerating PO fluids well. No diarrhea. VSS and afebrile. Mild tenderness to palpation of the RUQ. Mild leukocytosis 13.5 on admission. - HIDA scan 12/04/16 - ADAT today, NPO at midnight tonight - Urine culture - If dysuria continues, can consider outpt UTI tx  #Acute Dyspnea  ACOS  HFpEF: improved. Multifactorial, likely causes include ACOS (improved with solumedrol and Mg from EMS) vs anxiety (acute onset chest tightness, received ativan). Denies dyspnea this morning. Cardiology saw and recommended no further workup for cardiac cause. Tachypneic to RR 21, satting >92% on RA. 301 lb (dry weight 300 lb). No increased work of breathing, moving air okay, no w/r/r. - Continue home Dulera, Spiriva and albuterol  #Hypokalemia: improved. Increased to 3.3 (2.7 on admission), s/p 40 mEq KDUR, 30 mEq IV KCl. Likely in the setting of torsemide and metolozone use. EKG with QTc 476 (at admission QTc 559). Mg  2.2 on 12/03/16. - Likely will discharge on PO KDUR  #AKI: stable.  Creatinine stable at 1.76 (baseline 1.13-1.32). BUN:Cr 31, indicating likely prerenal. S/p 500 mL NS bolus in ED, will be conservative with fluid resuscitation in the setting of HFpEF/G1DD. - will encourage PO intake - Continue to hold home torsemide, lisinopril  #Hyponatremia: Na decreasd to 133 from 135 on admission, s/p 500 mL NS. Asymptomatic. Likely dilutional. - F/u Na as outpatient  ##CHRONIC #HFpEF/G1DD: BNP 13.9. Continue pindolol 2.69m BID. Holding torsemide 654mBID and lisinopril 2068maily. #Hypertension:Soft BPs this admission. Hold lisinopril, norvasc 5mg39mily until BPs improve. #Diabetes: BGs 172-313.Received 9u aspart total. SSI-moderate while inpt. Most recent Hgb A1c was 7.3%. Hold metformin 1000 mg PO BID. Continue lipitor 40 mg daily, ASA 40 mg. #Sleep Apnea:She does not currently use a CPAP machine at home. Encourage CPAP overnight.  #Chronic Back Pain:Continue home meloxicam 15mg65m and gabapentin 300mg 61mPRN, both for pain.   FEN/GI: Heart healthy diet Prophylaxis: lovenox  Disposition: FPTS obs  Subjective:  Subjectively feeling "okay." Endorses ongoing RUQ pain that is about the same as yesterday, though does note that she was laid up in bed with pain for 3 days prior to admission but is moving better today. Denies nausea/vomiting. Appetite still somewhat decreased, but going to try and eat breakfast. Tolerating fluids PO well. Denies BM in the last two days, though was having diarrhea before admission. Dyspnea improved from yesterday. No chest pain or palpitations.  Objective: Temp:  [97.4 F (36.3 C)-98.3 F (36.8 C)] 98.3 F (36.8 C) (02/14 0410) Pulse Rate:  [77-96] 93 (02/14 1016) Resp:  [19-21] 21 (  02/14 0410) BP: (91-139)/(54-86) 139/80 (02/14 1016) SpO2:  [92 %-97 %] 96 % (02/14 0410) Weight:  [136.5 kg (300 lb 14.4 oz)-136.5 kg (301 lb)] 136.5 kg (301 lb) (02/14  0410) Physical Exam: General: Tired appearing obese woman, sitting up in bed, pleasant and conversant. Cardiovascular: Distant heart sounds. Regular rate. Respiratory: Decreased breath sounds. Lungs clear to auscultation bilaterally, no w/r/r.  Abdomen: Tender to palpation in RUQ. No hepatomegaly. Extremities: Radial pulse 2+ bilaterally.  Laboratory:  Recent Labs Lab 12/02/16 1349  WBC 13.5*  HGB 16.4*  HCT 47.1*  PLT 198    Recent Labs Lab 12/02/16 1349 12/03/16 0446  NA 135 133*  K 2.7* 3.3*  CL 91* 91*  CO2 30 25  BUN 44* 56*  CREATININE 1.75* 1.76*  CALCIUM 9.9 9.5  PROT 7.4  --   BILITOT 1.0  --   ALKPHOS 76  --   ALT 38  --   AST 38  --   GLUCOSE 172* 272*   - Mg 2.2 - Lipase 32 - BNP 13.9  Imaging/Diagnostic Tests: Dg Chest 2 View  Result Date: 12/02/2016 CLINICAL DATA:  Shortness of breath. EXAM: CHEST  2 VIEW COMPARISON:  10/22/2016. FINDINGS: Mediastinum and hilar structures normal. Heart size normal. Low lung volumes with mild basilar atelectasis. Mild bibasilar infiltrates cannot be excluded. No pleural effusion or pneumothorax. IMPRESSION: Low lung volumes with mild basilar atelectasis. Mild bibasilar infiltrates cannot be excluded. Electronically Signed   By: Marcello Moores  Register   On: 12/02/2016 13:38   US Abdomen Complete  Result Date: 12/02/2016 CLINICAL DATA:  Acute onset of right upper quadrant abdominal pain. Initial encounter. EXAM: ABDOMEN ULTRASOUND COMPLETE COMPARISON:  CT of the abdomen and pelvis performed 06/01/2015, and right upper quadrant ultrasound performed 06/14/2015 FINDINGS: Gallbladder: No gallstones or wall thickening visualized. No sonographic Murphy sign noted by sonographer. Common bile duct: Diameter: 0.3 cm, within normal limits in caliber. Liver: No focal lesion identified. Diffusely increased parenchymal echogenicity likely reflects fatty infiltration. IVC: Not well characterized due to overlying structures. Pancreas: Not  visualized. Spleen: Size and appearance within normal limits. Right Kidney: Length: 11.1 cm. Echogenicity within normal limits. No mass or hydronephrosis visualized. Left Kidney: Length: 12.0 cm. Echogenicity within normal limits. No mass or hydronephrosis visualized. Abdominal aorta: Not visualized due to overlying soft tissue structures. Other findings: None. IMPRESSION: 1. No acute abnormality seen within the abdomen. Evaluation is suboptimal due to the patient's habitus. 2. Fatty infiltration within the liver. Electronically Signed   By: Garald Balding M.D.   On: 12/02/2016 18:05   Nm Pulmonary Perf And Vent  Result Date: 12/02/2016 CLINICAL DATA:  Shortness of breath, nausea and diarrhea EXAM: NUCLEAR MEDICINE VENTILATION - PERFUSION LUNG SCAN TECHNIQUE: Ventilation images were obtained in multiple projections using inhaled aerosol Tc-78mDTPA. Perfusion images were obtained in multiple projections after intravenous injection of Tc-948mAA. RADIOPHARMACEUTICALS:  32 mCi Technetium-9941mPA aerosol inhalation and 4.15 mCi Technetium-47m74m IV COMPARISON:  Chest radiograph from 12/02/2016. FINDINGS: Ventilation: No focal ventilation defect. Perfusion: No wedge shaped peripheral perfusion defects to suggest acute pulmonary embolism. IMPRESSION: 1. Very low probability for acute pulmonary embolus. Electronically Signed   By: TaylKerby Moors.   On: 12/02/2016 17:38    ThanRosario Adiedical Student 12/03/2016, 12:12 PM [email or web address] pager: [phone] pages welcome

## 2016-12-03 NOTE — Progress Notes (Signed)
Transitions of Care Pharmacy Note  Plan:  Educated on torsemide and metolazone, as well as proper inhaler technique.  Addressed concerns regarding dehydration --------------------------------------------- Hailey Barnes is an 56 y.o. female who presents with a chief complaint of RUQ pain. In anticipation of discharge, pharmacy has reviewed this patient's prior to admission medication history, as well as current inpatient medications listed per the [facility].  Current medication indications, dosing, frequency, and notable side effects reviewed with patient. patient verbalized understanding of current inpatient medication regimen and is aware that the After Visit Summary when presented, will represent the most accurate medication list at discharge.   Hailey Barnes expressed concerns regarding dehydration, headaches, pain on right side that has worsened since the last hospitalization, and poor urine output without diuretic therapy.    Assessment: Understanding of regimen: good Understanding of indications: good Potential of compliance: good Barriers to Obtaining Medications: No  Patient instructed to contact inpatient pharmacy team with further questions or concerns if needed.    Time spent preparing for discharge counseling: 15 minutes Time spent counseling patient: 30 minutes   Thank you for allowing pharmacy to be a part of this patient's care.  Dierdre Harness, Cain Sieve, PharmD Clinical Pharmacy Resident [phone] (Pager) 12/03/2016 5:36 PM

## 2016-12-03 NOTE — Consult Note (Signed)
CARDIOLOGY CONSULT NOTE       Patient ID: Hailey Barnes MRN: [id number] DOB/AGE: 01-14-1961 56 y.o.  Admit date: 12/02/2016 Referring Physician: Ree Kida Primary Physician: Dimas Chyle, MD Primary Cardiologist: Marlou Porch Reason for Consultation: Chest Pain  Active Problems:   Chest pain   HPI:  Chinyere Galiano a 56 y.o.female with past medical history of chronic diastolic CHF (EF 03-47% by echo in 05/2014), COPD, HTN, HLD, Type 2 DM, prior CVA, tobacco use, and history of substance use who presented to [facility] clinic with RUQ pain and chest pain. ? Panic attack Last seen by doctor Marlou Porch 10/23/16 and diuretic adjusted for weight gain 4 days of nausea , one diarrhea ? Viral gastroenteritis WBC elevated  Notes indicate some tightness in chest but associated with wheezing improved with inhaler V/Q low prob for PE No pain this am Enzymes negative and ECG no acute changes   ROS All other systems reviewed and negative except as noted above  Past Medical History:  Diagnosis Date  . Arthritis    "knees" (02/21/2014), back  . CHF (congestive heart failure) ([place])   . Chronic bronchitis ([place])    "get it q yr" (02/21/2014)  . Chronic diastolic CHF (congestive heart failure) ([place])    a. Echo 05/2014: EF 65-70% with Grade 1 DD.  Marland Kitchen Chronic lower back pain   . Complication of anesthesia    "I don't come out well; I chew on my tongue"  . COPD (chronic obstructive pulmonary disease) ([place])   . Diabetes mellitus without complication ([place])   . Fibroid   . GERD (gastroesophageal reflux disease)   . Heart murmur   . Hypertension   . Infection of right eye    current dx on Saturday 7/15 - using drops  . Migraine 1982-2009  . MVP (mitral valve prolapse)   . Neuromuscular disorder ([place])    right leg nerve pain  . Shortness of breath   . Sinus pause    a. noted on telemetry during admission from 10/2016, lasting up to 4.4 seconds. BB discontinued.   . Sleep apnea 02/2016   recent  hospitalization noted apnea during stay no sleep studt performed since this episode  . Stroke [facility]) noted on CAT 02/2014   "light", LLE weakness remains (03/30/2014)  . 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 Tingling in extremities 12/12/2010   Uric acid and electrolytes (02/23) normal but WBC elevated.   D/Dx: carpal tunnel, ulnar neuropathy Less likely: cervical radiculopathy, vasculitis     Family History  Problem Relation Age of Onset  . Hypertension Mother   . COPD Mother   . Hypertension Father   . Cancer Sister     UTERINE???  . COPD Sister     Social History   Social History  . Marital status: Single    Spouse name: N/A  . Number of children: N/A  . Years of education: N/A   Occupational History  . Not on file.   Social History Main Topics  . Smoking status: Current Every Day Smoker    Packs/day: 0.25    Years: 37.00    Types: Cigarettes  . Smokeless tobacco: Never Used     Comment: Would like to quit  . Alcohol use No  . Drug use: No     Comment: 03/30/2014 "stopped using crack 02/21/2014"  . Sexual activity: Yes    Birth control/ protection: Post-menopausal  Other Topics Concern  . Not on file   Social History Narrative   Married 11/16/2009 to [place] whom she met at Rehab.    Enjoys church at [employer], [place] geared towards people with substance abuse history.       Financial assistance approved for 100% discount at [facility] and has [facility] card [facility]   June 27, 2010 11:36 am    Past Surgical History:  Procedure Laterality Date  . [place]  . DILATATION & CURETTAGE/HYSTEROSCOPY WITH MYOSURE N/A 05/06/2016   Procedure: DILATATION & CURETTAGE/HYSTEROSCOPY WITH MYOSURE;  Surgeon: Terrance Mass, MD;  Location: [place] [facility];  Service: Gynecology;  Laterality: N/A;  request to follow around 8:45  requests one hour OR time  . DILATION AND CURETTAGE OF UTERUS    . FOOT SURGERY  Bilateral    "took bones out; put pins in"  . LEFT AND RIGHT HEART CATHETERIZATION WITH CORONARY ANGIOGRAM N/A 03/31/2014   Procedure: LEFT AND RIGHT HEART CATHETERIZATION WITH CORONARY ANGIOGRAM;  Surgeon: Birdie Riddle, MD;  Location: [place] [facility];  Service: Cardiovascular;  Laterality: N/A;  . MYOMECTOMY       . aspirin  81 mg Oral Daily  . atorvastatin  40 mg Oral Daily  . enoxaparin (LOVENOX) injection  65 mg Subcutaneous Q24H  . insulin aspart  0-5 Units Subcutaneous QHS  . insulin aspart  0-9 Units Subcutaneous TID WC  . magnesium chloride  2 tablet Oral Daily  . mometasone-formoterol  2 puff Inhalation BID  . pindolol  2.5 mg Oral BID  . sodium chloride flush  3 mL Intravenous Q12H  . tiotropium  18 mcg Inhalation Daily     Physical Exam: Blood pressure 108/63, pulse 96, temperature 98.3 F (36.8 C), temperature source Oral, resp. rate (!) 21, height _0  (1.626 m), weight (!) 301 lb (136.5 kg), last menstrual period 10/16/2015, SpO2 96 %.    Affect appropriate Obese black female  HEENT: normal Neck supple with no adenopathy JVP normal no bruits no thyromegaly Lungs clear with no wheezing and good diaphragmatic motion Heart:  S1/S2 no murmur, no rub, gallop or click PMI normal Abdomen: benighn, BS positve, no tenderness, no AAA no bruit.  No HSM or HJR Distal pulses intact with no bruits Plus 2 LE edema Neuro non-focal Skin warm and dry No muscular weakness   Labs:   Lab Results  Component Value Date   WBC 13.5 (H) 12/02/2016   HGB 16.4 (H) 12/02/2016   HCT 47.1 (H) 12/02/2016   MCV 84.6 12/02/2016   PLT 198 12/02/2016    Recent Labs Lab 12/02/16 1349 12/03/16 0446  NA 135 133*  K 2.7* 3.3*  CL 91* 91*  CO2 30 25  BUN 44* 56*  CREATININE 1.75* 1.76*  CALCIUM 9.9 9.5  PROT 7.4  --   BILITOT 1.0  --   ALKPHOS 76  --   ALT 38  --   AST 38  --   GLUCOSE 172* 272*   Lab Results  Component Value Date   CKTOTAL 127 05/27/2010   CKMB 1.6  05/27/2010   TROPONINI <0.03 12/02/2016    Lab Results  Component Value Date   CHOL 136 04/17/2016   CHOL 198 03/31/2014   CHOL 163 12/10/2013   Lab Results  Component Value Date   HDL 37 (L) 04/17/2016   HDL 36 (L) 03/31/2014   HDL 38 (L) 12/10/2013   Lab Results  Component Value  Date   LDLCALC 69 04/17/2016   LDLCALC 108 (H) 03/31/2014   LDLCALC 97 12/10/2013   Lab Results  Component Value Date   TRIG 150 (H) 04/17/2016   TRIG 270 (H) 03/31/2014   TRIG 138 12/10/2013   Lab Results  Component Value Date   CHOLHDL 3.7 04/17/2016   CHOLHDL 5.5 03/31/2014   CHOLHDL 4.3 12/10/2013   No results found for: LDLDIRECT    Radiology: Dg Chest 2 View  Result Date: 12/02/2016 CLINICAL DATA:  Shortness of breath. EXAM: CHEST  2 VIEW COMPARISON:  10/22/2016. FINDINGS: Mediastinum and hilar structures normal. Heart size normal. Low lung volumes with mild basilar atelectasis. Mild bibasilar infiltrates cannot be excluded. No pleural effusion or pneumothorax. IMPRESSION: Low lung volumes with mild basilar atelectasis. Mild bibasilar infiltrates cannot be excluded. Electronically Signed   By: Marcello Moores  Register   On: 12/02/2016 13:38   US Abdomen Complete  Result Date: 12/02/2016 CLINICAL DATA:  Acute onset of right upper quadrant abdominal pain. Initial encounter. EXAM: ABDOMEN ULTRASOUND COMPLETE COMPARISON:  CT of the abdomen and pelvis performed 06/01/2015, and right upper quadrant ultrasound performed 06/14/2015 FINDINGS: Gallbladder: No gallstones or wall thickening visualized. No sonographic Murphy sign noted by sonographer. Common bile duct: Diameter: 0.3 cm, within normal limits in caliber. Liver: No focal lesion identified. Diffusely increased parenchymal echogenicity likely reflects fatty infiltration. IVC: Not well characterized due to overlying structures. Pancreas: Not visualized. Spleen: Size and appearance within normal limits. Right Kidney: Length: 11.1 cm. Echogenicity within  normal limits. No mass or hydronephrosis visualized. Left Kidney: Length: 12.0 cm. Echogenicity within normal limits. No mass or hydronephrosis visualized. Abdominal aorta: Not visualized due to overlying soft tissue structures. Other findings: None. IMPRESSION: 1. No acute abnormality seen within the abdomen. Evaluation is suboptimal due to the patient's habitus. 2. Fatty infiltration within the liver. Electronically Signed   By: Garald Balding M.D.   On: 12/02/2016 18:05   Nm Pulmonary Perf And Vent  Result Date: 12/02/2016 CLINICAL DATA:  Shortness of breath, nausea and diarrhea EXAM: NUCLEAR MEDICINE VENTILATION - PERFUSION LUNG SCAN TECHNIQUE: Ventilation images were obtained in multiple projections using inhaled aerosol Tc-28mDTPA. Perfusion images were obtained in multiple projections after intravenous injection of Tc-974mAA. RADIOPHARMACEUTICALS:  32 mCi Technetium-9959mPA aerosol inhalation and 4.15 mCi Technetium-11m75m IV COMPARISON:  Chest radiograph from 12/02/2016. FINDINGS: Ventilation: No focal ventilation defect. Perfusion: No wedge shaped peripheral perfusion defects to suggest acute pulmonary embolism. IMPRESSION: 1. Very low probability for acute pulmonary embolus. Electronically Signed   By: TaylKerby Moors.   On: 12/02/2016 17:38    EKG: SR no acute ST changes    ASSESSMENT AND PLAN:  Chest Pain: atypical related to panic attack and bronchospasm. V/Q negative R/O with No acute ECG changes and echo in January fine. No further w/u indicated ok to d/c home from cards perspective  Diastolic Dysfunction.  BNP normal dyspnea from bronchospasm continue home dose diuretics  HTN:  Well controlled.  Continue current medications and low sodium Dash type diet.    GI:  Abdomen benign this am plan per primary service  Signed: PeteJenkins Rouge4/2018, 8:08 AM

## 2016-12-03 NOTE — Progress Notes (Signed)
Family Medicine Teaching Service Daily Progress Note Intern Pager: [phone]  Patient name: Hailey Barnes Medical record number: [id number] Date of birth: 1961-06-11 Age: 56 y.o. Gender: female  Primary Care Provider: Dimas Chyle, MD Consultants: Cardiology Code Status: Full  Pt Overview and Major Events to Date:  2/13 - Patient admitted after acute episode of dyspnea, new RUQ pain with poor PO, and hypokalemia with EKG changes  Assessment and Plan: Hailey Barnes is a 56 y.o. female presenting with RUQ pain. PMH is significant for CHF, COPD, HTN, T2DM, CVA and sleep apnea  RUQ pain - Stable/somewhat improved. Says this is worse after she eats heavier foods. 4 days of nausea, no vomiting, diarrhea yesterday and subjective fevers consistent with viral gastroenteritis. However, says she has been more concerned about constipation, as had recently been straining and having hard stools. No sick contacts but had started going to a gym last week and around more people. Poor PO intake due to decreased appetite. Small leukocytosis at 13.5. Remains afebrile. Consider UTI, but UA with rare bacteria, 0-5 squams, small Hgb, smal leuks. Biliary colic considered, however right upper quadrant ultrasound negative for acute pathology; no gallstones or common bile duct 0.3 cm, negative sonographic Murphy sign. Evidence of fatty liver. Pancreas not visualized, however lipase WNL at 32. Mild pain over RUQ, epigastric region today. TTP over R flank but appears MSK in nature.  - s/p 500 cc NS - monitor PO, ADAT to normal diet - zofran prn - consider starting PPI - HIDA scan ordered to r/o biliary colic - f/u urine culture; would treat UTI if grows bacteria - lipid panel tomorrow a.m.   Acute Dyspnea - Resolved. Severe chest tightness and dyspnea in clinic, maintained sats in upper 90s on RA, little relief with duoneb. Improved with ativan in the ED. There was initial concern for PE with elevated D dimer at 1.16,  VQ scan very low probability for PE. D dimer may be explained by poor PO intake. Cardiac etiology considered, troponins negative x 2, EKG without ST changes. Acute dyspnea and chest tightness may be explained by panic attack; received 0.5 IV ativan in ED, though suspect bronchospasm given resolution wit beta agonists and magnesium. Cardiology reviewed case, and has signed off.  - s/p solumedrol and mag, nitroglycerin and ASA from EMS - s/p ativan in ED  - saturating well on room air  Hypokalemia - 2.7 on admission, repleted in the ED with 40 mEq KDUR, 30 mEq IV KCl.  May be low in the setting of torsemide use.  - EKG with prolonged QTc; resolved this a.m. - monitor BMP; will give another 40 mEq kdur - replete electrolytes as needed  AKI: Creatinine 1.75 > 1.76, baseline appears to be 1.13-1.32 - Fluid bolus in the ED - will encourage PO intake, hold torsemide, lisinopril for now  HFpEF/G1DD: Echo 10/23/2016 EF 75%, G1DD, severe LV wall thickening. BNP 13.9 on admission. Patient is on pindolol 2.5 mg BID, lisinopril 20 mg, and torsemide 60 mg BID. Does not appear fluid overloaded on today's exam and is at dry weight. - continue pindolol - restart torsemide and lisinopril as able but holding for now (AKI, but also with low BPs)  COPD:on Dulera, Spiriva and albuterol at home. Lung exam without increased work of breathing or wheezing. - Continue home medications  Hypertension:BP low at 91/54 on admission. Improved with 500 CC NS bolus to 100 SBP noted at bedside. - hold lisinopril 20 mg Qd, norvasc 25m qd until BP  improve - holding torsemide 60 mg BID for AKI  Diabetes:Most recent POCT Hgb A1c was 7.3%  - Hold metformin 1000 mg PO BID while in the hospital, but plan to restart once discharged  - Increase SSI to moderate scale - Continue lipitor 40 mg daily, ASA 40 mg  Sleep Apnea:She does not currently use a CPAP machine at home. - encourage CPAP overnight   Chronic Back  Pain:No complaints of back pain at this time. - Reorder home gabapentin 300 mg PO at night PRN - Hold meloxicam for AKI   FEN/GI: SLIV, heart healthy diet Prophylaxis: lovenox  Disposition: Home pending improved PO intake and HIDA scan  Subjective:  Patient denies breathing difficulties this morning. She still has some right-sided back pain and abdominal pain but is taking some liquids.   Objective: Temp:  [97.4 F (36.3 C)-98.3 F (36.8 C)] 98.3 F (36.8 C) (02/14 0410) Pulse Rate:  [77-96] 96 (02/14 0410) Resp:  [19-21] 21 (02/14 0410) BP: (91-116)/(54-86) 108/63 (02/14 0410) SpO2:  [92 %-98 %] 96 % (02/14 0410) Weight:  [300 lb 14.4 oz (136.5 kg)-307 lb 3.2 oz (139.3 kg)] 301 lb (136.5 kg) (02/14 0410) Physical Exam: General: NAD, very pleasant, obese woman resting comfortably in bed ENTM: MMM Cardiovascular: RRR, no m/r/g           Respiratory: CTA bil, no W/R/R Gastrointestinal/GU: Mildly TTP over RUQ and epigastric region, nondistended, normoactive bowel sounds, no CVA tenderness MSK: Moves for extremities equally. Mild TTP over right-midback but not with percussion.  Neuro: CN II-XII Grossly intact Psych: AAOx3, no distress, affect appropriate  Laboratory:  Recent Labs Lab 12/02/16 1349  WBC 13.5*  HGB 16.4*  HCT 47.1*  PLT 198    Recent Labs Lab 12/02/16 1349 12/03/16 0446  NA 135 133*  K 2.7* 3.3*  CL 91* 91*  CO2 30 25  BUN 44* 56*  CREATININE 1.75* 1.76*  CALCIUM 9.9 9.5  PROT 7.4  --   BILITOT 1.0  --   ALKPHOS 76  --   ALT 38  --   AST 38  --   GLUCOSE 172* 272*    Imaging/Diagnostic Tests: Dg Chest 2 View  Result Date: 12/02/2016 CLINICAL DATA:  Shortness of breath. EXAM: CHEST  2 VIEW COMPARISON:  10/22/2016. FINDINGS: Mediastinum and hilar structures normal. Heart size normal. Low lung volumes with mild basilar atelectasis. Mild bibasilar infiltrates cannot be excluded. No pleural effusion or pneumothorax. IMPRESSION: Low lung  volumes with mild basilar atelectasis. Mild bibasilar infiltrates cannot be excluded. Electronically Signed   By: Marcello Moores  Register   On: 12/02/2016 13:38   US Abdomen Complete  Result Date: 12/02/2016 CLINICAL DATA:  Acute onset of right upper quadrant abdominal pain. Initial encounter. EXAM: ABDOMEN ULTRASOUND COMPLETE COMPARISON:  CT of the abdomen and pelvis performed 06/01/2015, and right upper quadrant ultrasound performed 06/14/2015 FINDINGS: Gallbladder: No gallstones or wall thickening visualized. No sonographic Murphy sign noted by sonographer. Common bile duct: Diameter: 0.3 cm, within normal limits in caliber. Liver: No focal lesion identified. Diffusely increased parenchymal echogenicity likely reflects fatty infiltration. IVC: Not well characterized due to overlying structures. Pancreas: Not visualized. Spleen: Size and appearance within normal limits. Right Kidney: Length: 11.1 cm. Echogenicity within normal limits. No mass or hydronephrosis visualized. Left Kidney: Length: 12.0 cm. Echogenicity within normal limits. No mass or hydronephrosis visualized. Abdominal aorta: Not visualized due to overlying soft tissue structures. Other findings: None. IMPRESSION: 1. No acute abnormality seen within the abdomen. Evaluation  is suboptimal due to the patient's habitus. 2. Fatty infiltration within the liver. Electronically Signed   By: Garald Balding M.D.   On: 12/02/2016 18:05   Nm Pulmonary Perf And Vent  Result Date: 12/02/2016 CLINICAL DATA:  Shortness of breath, nausea and diarrhea EXAM: NUCLEAR MEDICINE VENTILATION - PERFUSION LUNG SCAN TECHNIQUE: Ventilation images were obtained in multiple projections using inhaled aerosol Tc-81mDTPA. Perfusion images were obtained in multiple projections after intravenous injection of Tc-913mAA. RADIOPHARMACEUTICALS:  32 mCi Technetium-995mPA aerosol inhalation and 4.15 mCi Technetium-78m78m IV COMPARISON:  Chest radiograph from 12/02/2016. FINDINGS:  Ventilation: No focal ventilation defect. Perfusion: No wedge shaped peripheral perfusion defects to suggest acute pulmonary embolism. IMPRESSION: 1. Very low probability for acute pulmonary embolus. Electronically Signed   By: TaylKerby Moors.   On: 12/02/2016 17:38   Cindra Austad MoenCorinda Gubler 12/03/2016, 9:39 AM PGY-2, [facility] pager: [phone] pages welcome

## 2016-12-03 NOTE — Progress Notes (Signed)
Inpatient Diabetes Program Recommendations  AACE/ADA: New Consensus Statement on Inpatient Glycemic Control (2015)  Target Ranges:  Prepandial:   less than 140 mg/dL      Peak postprandial:   less than 180 mg/dL (1-2 hours)      Critically ill patients:  140 - 180 mg/dL   Lab Results  Component Value Date   GLUCAP 297 (H) 12/03/2016   HGBA1C 7.3 (H) 10/22/2016    Review of Glycemic Control:  Results for Hailey, Barnes (MRN [id number]) as of 12/03/2016 14:26  Ref. Range 12/02/2016 20:52 12/03/2016 08:06 12/03/2016 11:32  Glucose-Capillary Latest Ref Range: 65 - 99 mg/dL 313 (H) 258 (H) 297 (H)    Diabetes history: Type 2 diabetes Outpatient Diabetes medications: Metformin 1000 mg bid Current orders for Inpatient glycemic control: Novolog moderate tid with meals and HS  Inpatient Diabetes Program Recommendations:    While in the hospital, may consider adding Lantus 20 units daily.  A1C in January was 7.3% so likely will not need insulin at discharge.   Thanks, Adah Perl, RN, BC-ADM Inpatient Diabetes Coordinator Pager [phone] (8a-5p)

## 2016-12-04 ENCOUNTER — Observation Stay (HOSPITAL_COMMUNITY): Payer: Medicare HMO

## 2016-12-04 DIAGNOSIS — I5032 Chronic diastolic (congestive) heart failure: Secondary | ICD-10-CM

## 2016-12-04 DIAGNOSIS — N179 Acute kidney failure, unspecified: Secondary | ICD-10-CM | POA: Diagnosis not present

## 2016-12-04 DIAGNOSIS — I5022 Chronic systolic (congestive) heart failure: Secondary | ICD-10-CM

## 2016-12-04 DIAGNOSIS — R1011 Right upper quadrant pain: Secondary | ICD-10-CM

## 2016-12-04 DIAGNOSIS — I10 Essential (primary) hypertension: Secondary | ICD-10-CM

## 2016-12-04 DIAGNOSIS — E876 Hypokalemia: Secondary | ICD-10-CM

## 2016-12-04 DIAGNOSIS — I509 Heart failure, unspecified: Secondary | ICD-10-CM

## 2016-12-04 DIAGNOSIS — G8929 Other chronic pain: Secondary | ICD-10-CM

## 2016-12-04 LAB — LIPID PANEL
CHOLESTEROL: 129 mg/dL (ref 0–200)
HDL: 30 mg/dL — ABNORMAL LOW (ref >40)
LDL Cholesterol: 49 mg/dL (ref 0–99)
TRIGLYCERIDES: 249 mg/dL — AB (ref <150)
Total CHOL/HDL Ratio: 4.3 RATIO
VLDL: 50 mg/dL — ABNORMAL HIGH (ref 0–40)

## 2016-12-04 LAB — BASIC METABOLIC PANEL
ANION GAP: 13 (ref 5–15)
BUN: 36 mg/dL — ABNORMAL HIGH (ref 6–20)
CO2: 31 mmol/L (ref 22–32)
Calcium: 9.5 mg/dL (ref 8.9–10.3)
Chloride: 93 mmol/L — ABNORMAL LOW (ref 101–111)
Creatinine, Ser: 1.27 mg/dL — ABNORMAL HIGH (ref 0.44–1.00)
GFR calc Af Amer: 55 mL/min — ABNORMAL LOW (ref >60)
GFR, EST NON AFRICAN AMERICAN: 47 mL/min — AB (ref >60)
GLUCOSE: 171 mg/dL — AB (ref 65–99)
POTASSIUM: 3.2 mmol/L — AB (ref 3.5–5.1)
Sodium: 137 mmol/L (ref 135–145)

## 2016-12-04 LAB — URINE CULTURE

## 2016-12-04 LAB — GLUCOSE, CAPILLARY
Glucose-Capillary: 228 mg/dL — ABNORMAL HIGH (ref 65–99)
Glucose-Capillary: 238 mg/dL — ABNORMAL HIGH (ref 65–99)

## 2016-12-04 MED ORDER — TECHNETIUM TC 99M MEBROFENIN IV KIT
5.0000 | PACK | Freq: Once | INTRAVENOUS | Status: AC | PRN
  Administered 2016-12-04: 5 via INTRAVENOUS

## 2016-12-04 MED ORDER — POTASSIUM CHLORIDE CRYS ER 20 MEQ PO TBCR
40.0000 meq | EXTENDED_RELEASE_TABLET | Freq: Once | ORAL | Status: AC
  Administered 2016-12-04: 40 meq via ORAL
  Filled 2016-12-04: qty 2

## 2016-12-04 MED ORDER — POTASSIUM CHLORIDE ER 20 MEQ PO TBCR: 20.0000 meq | EXTENDED_RELEASE_TABLET | Freq: Every day | ORAL | 0 refills | Status: DC

## 2016-12-04 NOTE — Progress Notes (Signed)
Patient discharge order received.  Discharge instructions reviewed with and given to patient.  Potassium script not on AVS.  Called Dr Brita Romp she stated she would send the script to [facility].  Patient notified of this.

## 2016-12-04 NOTE — Progress Notes (Signed)
Family Medicine Teaching Service Daily Progress Note Intern Pager: [phone]  Patient name: Hailey Barnes Medical record number: [id number] Date of birth: 07/18/1961 Age: 56 y.o. Gender: female  Primary Care Provider: Dimas Chyle, MD Consultants: Cardiology Code Status: Full  Pt Overview and Major Events to Date:  2/13 - Patient admitted after acute episode of dyspnea, new RUQ pain with poor PO, and hypokalemia with EKG changes  Assessment and Plan: Hailey Barnes is a 56 y.o. female presenting with RUQ pain. PMH is significant for CHF, COPD, HTN, T2DM, CVA and sleep apnea  RUQ pain - Stable.  Tolerating regular diet without N/V. HIDA scan wnl - zofran prn - consider starting H2 blocker - f/u urine culture - consider OP GI f/u for further eval of NAFLD  Acute Dyspnea - Resolved. Likely related to bronchospasm vs anxiety  Hypokalemia -3.2 this AM.  Likely related to loop diuretic use  - replete with KDur 66mQ - will continue as OP - needs OP f/u  AKI: Improving. Creatinine near baseline at 1.27  HFpEF/G1DD: Stable. Slightly hypervolemic today. - continue pindolol - restart torsemide at d/c - hold metolazone and lisinopril until OP f/u  COPD:stable - Continue home Dulera, spiriva, prn albuterol  Hypertension:Stable without medications - hold lisinopril 20 mg Qd, norvasc 569mqd   Diabetes:CBGs 198-297 in last 24h.  Required 16 units SSI - Hold metformin 1000 mg PO BID - resume at d/c - Continue moderate SSI - Continue lipitor 40 mg daily, ASA 40 mg  Sleep Apnea: - encourage CPAP overnight   Chronic Back Pain:Stable - Reorder home gabapentin 300 mg PO at night PRN - Hold meloxicam for AKI  FEN/GI: SLIV, heart healthy diet Prophylaxis: lovenox  Disposition: Home later today  Subjective:  Patient denies breathing difficulties this morning. She still has some intermittent sharp RUQ pain that is worse in AM and worse with deep  breathing.  Objective: Temp:  [97.4 F (36.3 C)-98.3 F (36.8 C)] 98.3 F (36.8 C) (02/15 0513) Pulse Rate:  [75-86] 85 (02/15 1040) Resp:  [16-18] 16 (02/15 0513) BP: (111-137)/(75-87) 114/81 (02/15 1040) SpO2:  [95 %-99 %] 96 % (02/15 0513) Weight:  [302 lb 8 oz (137.2 kg)] 302 lb 8 oz (137.2 kg) (02/15 0513) Physical Exam: General: NAD, very pleasant, obese woman resting comfortably in bed ENTM: MMM Cardiovascular: RRR, no m/r/g           Respiratory: CTA bil, no W/R/R Gastrointestinal/GU: TTP over RUQ and epigastric region, +Murphy's sign, nondistended, normoactive bowel sounds, no CVA tenderness MSK: Moves for extremities equally Neuro: CN II-XII Grossly intact Psych: AAOx3, no distress, affect appropriate  Laboratory:  Recent Labs Lab 12/02/16 1349  WBC 13.5*  HGB 16.4*  HCT 47.1*  PLT 198    Recent Labs Lab 12/02/16 1349 12/03/16 0446 12/04/16 0905  NA 135 133* 137  K 2.7* 3.3* 3.2*  CL 91* 91* 93*  CO2 30 25 31   BUN 44* 56* 36*  CREATININE 1.75* 1.76* 1.27*  CALCIUM 9.9 9.5 9.5  PROT 7.4  --   --   BILITOT 1.0  --   --   ALKPHOS 76  --   --   ALT 38  --   --   AST 38  --   --   GLUCOSE 172* 272* 171*    Imaging/Diagnostic Tests: Nm Hepato W/eject Fract  Result Date: 12/04/2016 CLINICAL DATA:  RIGHT upper quadrant pain, nausea, and vomiting for 1 week, known fatty liver disease EXAM: NUCLEAR  MEDICINE HEPATOBILIARY IMAGING WITH GALLBLADDER EF TECHNIQUE: Sequential images of the abdomen were obtained out to 60 minutes following intravenous administration of radiopharmaceutical. After oral ingestion of Ensure, gallbladder ejection fraction was determined. At 60 min, normal ejection fraction is greater than 33%. RADIOPHARMACEUTICALS:  5.1 mCi Tc-3m Choletec IV COMPARISON:  None FINDINGS: Patient motion artifacts throughout exam. Normal tracer extraction from bloodstream indicating normal hepatocellular function. Normal excretion of tracer into biliary  tree. Gallbladder visualized at 31 min. Small bowel visualized at 42 min. No hepatic retention of tracer. Subjectively normal emptying of tracer from gallbladder following fatty meal stimulation. Calculated gallbladder ejection fraction is 89% , normal. Patient reported no symptoms following Ensure ingestion. Normal gallbladder ejection fraction following Ensure ingestion is greater than 33% at 1 hour. IMPRESSION: Normal exam. Electronically Signed   By: [name]   On: 12/04/2016 10:32     AVirginia Crews MD 12/04/2016, 12:08 PM PGY-3, [facility] pager: [phone] text pages welcome

## 2016-12-04 NOTE — Discharge Summary (Signed)
[facility] Discharge Summary  Patient name: Hailey Barnes Medical record number: [id number] Date of birth: [birth year] Age: 56 y.o. Gender: female Date of Admission: 12/02/2016  Date of Discharge: 12/04/2016 Admitting Physician: Lupita Dawn, MD  Primary Care Provider: Dimas Chyle, MD Consultants: cardiology  Indication for Hospitalization: RUQ pain and acute dyspnea  Discharge Diagnoses/Problem List:  RUQ pain - unclear etiology Acute dyspnea - resolved Hypokalemia AKI HFpEF COPD HTN T2DM OSA Chronic back pain  Disposition: home  Discharge Condition: stable  Discharge Exam: See progress note from day of discharge  Brief Hospital Course:  Hailey Barnes is a 56 y.o. F  with past medical history significant for chronic PID, CHF, COPD, tobacco abuse, obesity, OSA who presented with 4 days of right upper quadrant pain and nausea, as well as acute onset dyspnea in clinic on 12/02/16.  #Right upper quadrant abdominal pain - improved: Presented with chronic constant right upper quadrant pain worse after eating. Considered possible viral gastroenteritis given nausea, vomiting, diarrhea, subjective fevers prior to admission. WBC 13.5, afebrile throughout admission. Considered UTI, but urine culture with less than 10,000 colonies.  Considered gallbladder pathology, RUQ ultrasound showed fatty infiltration of the liver without gallstones or cholecystitis. HIDA scan without evidence of biliary disease. Could also consider hepatic capsulitis potentially related to her chronic PID.  #Acute dyspnea - resolved: Severe chest tightness and dyspnea without hypoxia on room air that resolved with Ativan in the emergency department. Pulmonary embolism was ruled out with VQ scan showing very low probability of PE after d-dimer was elevated at 1.16. Troponins were negative 3 and EKG was without ischemic changes. Most likely that this acute dyspnea was related to panic attack.   Patient experienced similar symptoms again when getting CT and HIDA scan. Cardiology was consulted and deemed this to not be a cardiac etiology.    #Hypokalemia: Potassium 2.7 on admission. This was repleted throughout admission. Likely related to loop diuretic use as an outpatient. EKG initially with prolonged QTc that resolved on repeat. Patient was discharged on daily potassium supplementation. This will likely improve with resumption of ACEi which was held throughout admission and at discharge.  #AKI: Creatinine elevated to 1.75 on admission with baseline around 1.1-1.2. Likely prerenal in the setting of dehydration due to vomiting and diarrhea. Improved to 1.27 with IV hydration prior to discharge. Home nephrotoxic medications, including torsemide, metolazone, lisinopril, NSAID held throughout admission.  #HFpEF: Patient's diuretics had recently been increased to torsemide 60 mg BID and metolazone added. She was hypovolemic with an AKI on admission, so diuretics were held. Home torsemide was resumed on discharge and metolazone continued to be held.   #Hypertension: Blood pressure was low in the 90s over 50s on admission. This improved with IV hydration and holding home antihypertensives. Lisinopril and Norvasc continued to be held on discharge.  All other chronic medical conditions stable throughout admission and managed with home regimens.  Issues for Follow Up:   Consider GI referral for ongoing abdominal pain that could be related to NAFLD  Recheck BMP to evaluate for resolution of AKI and potassium level  Consider GAD7 testing for anxiety as dyspnea may be a related to panic attack  Follow-up blood pressure and titrate to hypertensive as needed  Significant Procedures: None  Significant Labs and Imaging:   Recent Labs Lab 12/02/16 1349  WBC 13.5*  HGB 16.4*  HCT 47.1*  PLT 198    Recent Labs Lab 12/02/16 1349 12/03/16 0446 12/04/16 0905  NA 135 133* 137  K 2.7* 3.3*  3.2*  CL 91* 91* 93*  CO2 30 25 31   GLUCOSE 172* 272* 171*  BUN 44* 56* 36*  CREATININE 1.75* 1.76* 1.27*  CALCIUM 9.9 9.5 9.5  MG  --  2.2  --   ALKPHOS 76  --   --   AST 38  --   --   ALT 38  --   --   ALBUMIN 4.0  --   --    Lipid Panel     Component Value Date/Time   CHOL 129 12/04/2016 0341   TRIG 249 (H) 12/04/2016 0341   HDL 30 (L) 12/04/2016 0341   CHOLHDL 4.3 12/04/2016 0341   VLDL 50 (H) 12/04/2016 0341   LDLCALC 49 12/04/2016 0341    Urinalysis    Component Value Date/Time   COLORURINE STRAW (A) 12/02/2016 1320   APPEARANCEUR CLEAR 12/02/2016 1320   LABSPEC 1.006 12/02/2016 1320   PHURINE 6.0 12/02/2016 1320   GLUCOSEU NEGATIVE 12/02/2016 1320   HGBUR SMALL (A) 12/02/2016 1320   HGBUR small 06/27/2010 1409   BILIRUBINUR NEGATIVE 12/02/2016 1320   BILIRUBINUR NEG 09/18/2016 1013   KETONESUR NEGATIVE 12/02/2016 1320   PROTEINUR NEGATIVE 12/02/2016 1320   UROBILINOGEN 0.2 09/18/2016 1013   UROBILINOGEN 0.2 12/07/2010 0202   NITRITE NEGATIVE 12/02/2016 1320   LEUKOCYTESUR SMALL (A) 12/02/2016 1320    D-dimer 1.16 Lipase 32 BNP 13.9 Troponin negative 3  Dg Chest 2 View  Result Date: 12/02/2016 CLINICAL DATA:  Shortness of breath. EXAM: CHEST  2 VIEW COMPARISON:  10/22/2016. FINDINGS: Mediastinum and hilar structures normal. Heart size normal. Low lung volumes with mild basilar atelectasis. Mild bibasilar infiltrates cannot be excluded. No pleural effusion or pneumothorax. IMPRESSION: Low lung volumes with mild basilar atelectasis. Mild bibasilar infiltrates cannot be excluded. Electronically Signed   By: Marcello Moores  Register   On: 12/02/2016 13:38   US Abdomen Complete  Result Date: 12/02/2016 CLINICAL DATA:  Acute onset of right upper quadrant abdominal pain. Initial encounter. EXAM: ABDOMEN ULTRASOUND COMPLETE COMPARISON:  CT of the abdomen and pelvis performed 06/01/2015, and right upper quadrant ultrasound performed 06/14/2015 FINDINGS: Gallbladder: No  gallstones or wall thickening visualized. No sonographic Murphy sign noted by sonographer. Common bile duct: Diameter: 0.3 cm, within normal limits in caliber. Liver: No focal lesion identified. Diffusely increased parenchymal echogenicity likely reflects fatty infiltration. IVC: Not well characterized due to overlying structures. Pancreas: Not visualized. Spleen: Size and appearance within normal limits. Right Kidney: Length: 11.1 cm. Echogenicity within normal limits. No mass or hydronephrosis visualized. Left Kidney: Length: 12.0 cm. Echogenicity within normal limits. No mass or hydronephrosis visualized. Abdominal aorta: Not visualized due to overlying soft tissue structures. Other findings: None. IMPRESSION: 1. No acute abnormality seen within the abdomen. Evaluation is suboptimal due to the patient's habitus. 2. Fatty infiltration within the liver. Electronically Signed   By: Garald Balding M.D.   On: 12/02/2016 18:05   Nm Hepato W/eject Fract  Result Date: 12/04/2016 CLINICAL DATA:  RIGHT upper quadrant pain, nausea, and vomiting for 1 week, known fatty liver disease EXAM: NUCLEAR MEDICINE HEPATOBILIARY IMAGING WITH GALLBLADDER EF TECHNIQUE: Sequential images of the abdomen were obtained out to 60 minutes following intravenous administration of radiopharmaceutical. After oral ingestion of Ensure, gallbladder ejection fraction was determined. At 60 min, normal ejection fraction is greater than 33%. RADIOPHARMACEUTICALS:  5.1 mCi Tc-56m Choletec IV COMPARISON:  None FINDINGS: Patient motion artifacts throughout exam. Normal tracer extraction from bloodstream  indicating normal hepatocellular function. Normal excretion of tracer into biliary tree. Gallbladder visualized at 31 min. Small bowel visualized at 42 min. No hepatic retention of tracer. Subjectively normal emptying of tracer from gallbladder following fatty meal stimulation. Calculated gallbladder ejection fraction is 89% , normal. Patient reported  no symptoms following Ensure ingestion. Normal gallbladder ejection fraction following Ensure ingestion is greater than 33% at 1 hour. IMPRESSION: Normal exam. Electronically Signed   By: Lavonia Dana M.D.   On: 12/04/2016 10:32   Nm Pulmonary Perf And Vent  Result Date: 12/02/2016 CLINICAL DATA:  Shortness of breath, nausea and diarrhea EXAM: NUCLEAR MEDICINE VENTILATION - PERFUSION LUNG SCAN TECHNIQUE: Ventilation images were obtained in multiple projections using inhaled aerosol Tc-66mDTPA. Perfusion images were obtained in multiple projections after intravenous injection of Tc-91mAA. RADIOPHARMACEUTICALS:  32 mCi Technetium-9994mPA aerosol inhalation and 4.15 mCi Technetium-26m16m IV COMPARISON:  Chest radiograph from 12/02/2016. FINDINGS: Ventilation: No focal ventilation defect. Perfusion: No wedge shaped peripheral perfusion defects to suggest acute pulmonary embolism. IMPRESSION: 1. Very low probability for acute pulmonary embolus. Electronically Signed   By: TaylKerby Moors.   On: 12/02/2016 17:38    Results/Tests Pending at Time of Discharge: None  Discharge Medications:  Allergies as of 12/04/2016   No Known Allergies     Medication List    STOP taking these medications   amLODipine 5 MG tablet Commonly known as:  NORVASC   lisinopril 20 MG tablet Commonly known as:  PRINIVIL,ZESTRIL   metolazone 5 MG tablet Commonly known as:  ZAROXOLYN     TAKE these medications   ACCU-CHEK AVIVA PLUS w/Device Kit Use daily as needed to check blood sugar.   accu-chek softclix lancets Use daily as needed to check blood sugar.   albuterol 108 (90 Base) MCG/ACT inhaler Commonly known as:  PROVENTIL HFA;VENTOLIN HFA Inhale 2 puffs into the lungs every 6 (six) hours as needed for wheezing or shortness of breath.   albuterol (2.5 MG/3ML) 0.083% nebulizer solution Commonly known as:  PROVENTIL Take 3 mLs (2.5 mg total) by nebulization every 6 (six) hours as needed for wheezing or  shortness of breath.   aspirin 81 MG tablet Take 81 mg by mouth daily.   atorvastatin 40 MG tablet Commonly known as:  LIPITOR TAKE ONE TABLET BY MOUTH ONCE DAILY   DULERA 200-5 MCG/ACT Aero Generic drug:  mometasone-formoterol Inhale 2 puffs into the lungs 2 (two) times daily.   E-Z SPACER inhaler Use as instructed   glucose blood test strip Commonly known as:  ACCU-CHEK AVIVA Use daily as needed to check blood sugar.   hydrocortisone 1 % ointment Apply 1 application topically 2 (two) times daily as needed (eczema on face).   magnesium chloride 64 MG Tbec SR tablet Commonly known as:  SLOW-MAG Take 2 tablets (128 mg total) by mouth daily.   metFORMIN 1000 MG tablet Commonly known as:  GLUCOPHAGE Take 1 tablet (1,000 mg total) by mouth 2 (two) times daily with a meal.   nicotine 21 mg/24hr patch Commonly known as:  NICODERM CQ - dosed in mg/24 hours Place 1 patch (21 mg total) onto the skin daily.   pindolol 5 MG tablet Commonly known as:  VISKEN Take 0.5 tablets (2.5 mg total) by mouth 2 (two) times daily. What changed:  when to take this   Potassium Chloride ER 20 MEQ Tbcr Take 20 mEq by mouth daily.   tiotropium 18 MCG inhalation capsule Commonly known as:  SPIRIVA  Place 1 capsule (18 mcg total) into inhaler and inhale daily.   torsemide 20 MG tablet Commonly known as:  DEMADEX Take 3 tablets (60 mg total) by mouth 2 (two) times daily.   triamcinolone cream 0.1 % Commonly known as:  KENALOG Apply 1 application topically 3 (three) times daily as needed (eczema on arms).       Discharge Instructions: Please refer to Patient Instructions section of EMR for full details.  Patient was counseled important signs and symptoms that should prompt return to medical care, changes in medications, dietary instructions, activity restrictions, and follow up appointments.   Follow-Up Appointments: Follow-up Information    Dimas Chyle, MD. Go on 12/08/2016.    Specialty:  Family Medicine Why:  Appointment with Dr. [name] at 3:00 pm, please arrive 15 minutes early. Contact information: [address] [place] [address] [phone]           Virginia Crews, MD 12/04/2016, 3:37 PM PGY-3, [place]

## 2016-12-04 NOTE — Care Management Obs Status (Signed)
[facility] NOTIFICATION   Patient Details  Name: Hailey Barnes MRN: [id number] Date of Birth: September 09, 1961   Medicare Observation Status Notification Given:  Yes    Bethena Roys, RN 12/04/2016, 12:02 PM

## 2016-12-04 NOTE — Discharge Instructions (Signed)
You were admitted for shortness of breath and abdominal pain.  For your shortness of breath, it seems like it was related to being in an enclosed space. You did a great job calming yourself down when you felt it happening again in the [place], so continue to work on this. If you are interested, search for some information on mindfulness practices (for example: https://mindfulnessexercises.com).  For your abdominal pain, we did an ultrasound and another scan of your gallbladder which did not show any structural problems or stones that could be causing your pain. By the time you left, you were eating and drinking well and the pain was manageable which is a reassuring sign.  You also had an acute kidney injury when you arrived to the hospital. By the time you left, your kidney function was better. Your outpatient primary care doctor and cardiologist will continue to check your kidney function and make medication changes as they are needed.  Diabetes Mellitus and Food It is important for you to manage your blood sugar (glucose) level. Your blood glucose level can be greatly affected by what you eat. Eating healthier foods in the appropriate amounts throughout the day at about the same time each day will help you control your blood glucose level. It can also help slow or prevent worsening of your diabetes mellitus. Healthy eating may even help you improve the level of your blood pressure and reach or maintain a healthy weight. General recommendations for healthful eating and cooking habits include:  Eating meals and snacks regularly. Avoid going long periods of time without eating to lose weight.  Eating a diet that consists mainly of plant-based foods, such as fruits, vegetables, nuts, legumes, and whole grains.  Using low-heat cooking methods, such as baking, instead of high-heat cooking methods, such as deep frying. Work with your dietitian to make sure you understand how to use the Nutrition Facts  information on food labels. How can food affect me? Carbohydrates  Carbohydrates affect your blood glucose level more than any other type of food. Your dietitian will help you determine how many carbohydrates to eat at each meal and teach you how to count carbohydrates. Counting carbohydrates is important to keep your blood glucose at a healthy level, especially if you are using insulin or taking certain medicines for diabetes mellitus. Alcohol  Alcohol can cause sudden decreases in blood glucose (hypoglycemia), especially if you use insulin or take certain medicines for diabetes mellitus. Hypoglycemia can be a life-threatening condition. Symptoms of hypoglycemia (sleepiness, dizziness, and disorientation) are similar to symptoms of having too much alcohol. If your health care provider has given you approval to drink alcohol, do so in moderation and use the following guidelines:  Women should not have more than one drink per day, and men should not have more than two drinks per day. One drink is equal to:  12 oz of beer.  5 oz of wine.  1 oz of hard liquor.  Do not drink on an empty stomach.  Keep yourself hydrated. Have water, diet soda, or unsweetened iced tea.  Regular soda, juice, and other mixers might contain a lot of carbohydrates and should be counted. What foods are not recommended? As you make food choices, it is important to remember that all foods are not the same. Some foods have fewer nutrients per serving than other foods, even though they might have the same number of calories or carbohydrates. It is difficult to get your body what it needs when you  eat foods with fewer nutrients. Examples of foods that you should avoid that are high in calories and carbohydrates but low in nutrients include:  Trans fats (most processed foods list trans fats on the Nutrition Facts label).  Regular soda.  Juice.  Candy.  Sweets, such as cake, pie, doughnuts, and cookies.  Fried  foods. What foods can I eat? Eat nutrient-rich foods, which will nourish your body and keep you healthy. The food you should eat also will depend on several factors, including:  The calories you need.  The medicines you take.  Your weight.  Your blood glucose level.  Your blood pressure level.  Your cholesterol level. You should eat a variety of foods, including:  Protein.  Lean cuts of meat.  Proteins low in saturated fats, such as fish, egg whites, and beans. Avoid processed meats.  Fruits and vegetables.  Fruits and vegetables that may help control blood glucose levels, such as apples, mangoes, and yams.  Dairy products.  Choose fat-free or low-fat dairy products, such as milk, yogurt, and cheese.  Grains, bread, pasta, and rice.  Choose whole grain products, such as multigrain bread, whole oats, and brown rice. These foods may help control blood pressure.  Fats.  Foods containing healthful fats, such as nuts, avocado, olive oil, canola oil, and fish. Does everyone with diabetes mellitus have the same meal plan? Because every person with diabetes mellitus is different, there is not one meal plan that works for everyone. It is very important that you meet with a dietitian who will help you create a meal plan that is just right for you. This information is not intended to replace advice given to you by your health care provider. Make sure you discuss any questions you have with your health care provider. Document Released: 07/03/2005 Document Revised: 03/13/2016 Document Reviewed: 09/02/2013 Elsevier Interactive Patient Education  2017 [employer].

## 2016-12-05 ENCOUNTER — Telehealth: Payer: Self-pay | Admitting: *Deleted

## 2016-12-05 NOTE — Telephone Encounter (Signed)
Transitional Care Post-Discharge Follow-Up Phone Call:   Admit date: 12/02/2016 Discharge date: 12/04/2016  Discharge Disposition: Home  Best patient contact number: [phone] Emergency contact(s): Birdie Riddle (daughter) [phone] PCP: [name]  Principal Discharge Diagnosis: RUQ Pain, Dyspnea  Reason for Chronic Case Management: Co-morbidities as follows:  CHF, COPD, HTN, T2DM, CVA, OSA,; 2 ED and 3 Admissions in last 6 months  Post-discharge Communication:   Call Completed: Yes, with patient   Interpreter Needed: No   Please check all that apply:  X Patient is caring for self at home.  ? Patient has caregiver. If so, name and best contact number:  X Patient is knowledgeable of his/her condition(s) and/or treatment.  ? Family and/or caregiver is knowledgeable of patient's condition(s) and/or treatment.   Medication Reconciliation:  X Medication list reviewed with patient.  X Patient has all discharge medications Uses [facility] at [facility]. Has all discharge meds. Knew to stop amlodipine, lisinopril and metolazone. States occassionally her daughter will help her afford her meds. X Patient has O2/CPAP ordered? If so, name of company that supplies: Patient states she needs to take CPAP classes before [facility] will supply her with machine. Was scheduled for class during hospitalization. Pt will call to r/s.  Activities of Daily Living:  X Independent  ? Needs assist (describe)  ? Total Care (describe)   Community resources in place for patient:  X None  ? Home Health If so, name of agency: ? [facility] If so, name of Care Manager and contact number:   ? Assisted Living  ? Hospice  ? Support Group    Topics discussed:  Home Environment: Patient lives alone in one level home with 4 front steps with handrails on both sides.  Support System: Daughter, Birdie Riddle and sister, Alesia Banda  Transportation: Barriers? Patient has own car and denies transportation  issues.  Food/Nutrition: (ability to afford, access, use of any community resources) Denies food insecurity  Identified Barriers: None at present  Topics Discussed: Patient states she's been doing, " A lot better this morning". Denies SHOB, nausea but reports dizziness at rest as well as standing. Patient's daughter will be bringing her Pedialyte today.   Reminded of TOC appt on 12/08/2016 at 3 with Dr.Parker. States she should not have any difficulty coming to this appt. Reminded of future lab and nutrition appts as well. Patient wasn't aware of recommendation to have colonoscopy. Will provide contact info to patient at [facility].   Patient Education: Patient has home scale. Instructed to weigh daily without clothing (or same amount of clothing) upon waking and after voiding, record daily weights and bring log to all future appts. Patient states she will.           Hubbard Hartshorn, RN, BSN

## 2016-12-08 ENCOUNTER — Telehealth: Payer: Self-pay | Admitting: Family Medicine

## 2016-12-08 ENCOUNTER — Encounter: Payer: Self-pay | Admitting: Family Medicine

## 2016-12-08 ENCOUNTER — Ambulatory Visit (INDEPENDENT_AMBULATORY_CARE_PROVIDER_SITE_OTHER): Payer: Medicare HMO | Admitting: Family Medicine

## 2016-12-08 VITALS — BP 140/80 | HR 91 | Temp 98.2°F | Wt 303.0 lb

## 2016-12-08 DIAGNOSIS — I10 Essential (primary) hypertension: Secondary | ICD-10-CM

## 2016-12-08 DIAGNOSIS — G8929 Other chronic pain: Secondary | ICD-10-CM

## 2016-12-08 DIAGNOSIS — F410 Panic disorder [episodic paroxysmal anxiety] without agoraphobia: Secondary | ICD-10-CM

## 2016-12-08 DIAGNOSIS — R1084 Generalized abdominal pain: Secondary | ICD-10-CM

## 2016-12-08 DIAGNOSIS — R1011 Right upper quadrant pain: Secondary | ICD-10-CM

## 2016-12-08 DIAGNOSIS — N179 Acute kidney failure, unspecified: Secondary | ICD-10-CM

## 2016-12-08 LAB — BASIC METABOLIC PANEL WITH GFR
BUN: 23 mg/dL (ref 7–25)
CHLORIDE: 99 mmol/L (ref 98–110)
CO2: 28 mmol/L (ref 20–31)
Calcium: 9.4 mg/dL (ref 8.6–10.4)
Creat: 1.21 mg/dL — ABNORMAL HIGH (ref 0.50–1.05)
GFR, Est African American: 59 mL/min — ABNORMAL LOW (ref >=60)
GFR, Est Non African American: 51 mL/min — ABNORMAL LOW (ref >=60)
Glucose, Bld: 99 mg/dL (ref 65–99)
POTASSIUM: 3.4 mmol/L — AB (ref 3.5–5.3)
SODIUM: 142 mmol/L (ref 135–146)

## 2016-12-08 NOTE — Assessment & Plan Note (Addendum)
At goal. Will follow up with cardiology in 2 days. Consider stopping pindolol and switch to non beta blocker given that she has a history of sinus pauses, though will defer to cardiology given that this was started by them while she was hospitalized.

## 2016-12-08 NOTE — Assessment & Plan Note (Signed)
Referral again placed to bariatric surgery today per her and daughter's request. BMI 52.01.

## 2016-12-08 NOTE — Assessment & Plan Note (Signed)
Recheck BMP today.

## 2016-12-08 NOTE — Assessment & Plan Note (Signed)
Will check urea breath test today to ensure adequate h pylori treatment. Had had extensive work up in the past which has been largely negative including RUQ [place] and HIDA scan. Lipase also negative. Will refer to GI for further evaluation - also needs colonoscopy. She did have NAFLD on her RUQ [place], though it is unclear how much that is contributing.

## 2016-12-08 NOTE — Telephone Encounter (Signed)
Patient's daughter dropped off FMLA paperwork. My portion was completed. From will be ready for pick up.  [name]. [name], [facility] Resident PGY-3 12/08/2016 5:09 PM

## 2016-12-08 NOTE — Progress Notes (Signed)
TRANSITION OF CARE VISIT  Primary Care Physician (PCP): Dimas Chyle, MD                                                    [facility]                                                   [address]                                                   [place], [place] [address]                                                   [place]    Date of Admission: 12/02/2016  Date of Discharge: 12/04/2016  Discharged from: [facility]  Discharge Diagnosis: RUQ pain, acute dyspnea  Summary of Admission: Patient admitted for acute dyspnea that occurred during an office visit. She had a full work up while admitted including VQ scan, serial troponings, and EKG that was negative. She was also having some RUQ pain - RUQ and HIDA scan only revealed NAFLD.   TODAY's VISIT  Patient/Caregiver self-reported problems/concerns:   1) Panic Attacks/Dyspnea No further episodes since going home. She reports having panic attacks about 30 years ago, but none since. She does not want any treatment for this.  2) RUQ Pain Still having some pain. No diarrhea or constipation. She was positive for H pylori about 2 years ago and received treatment.  3) Hypertension Patient's lisinopril and norvasc were held while she was in the hospital due to low BPs.   MEDICATIONS  Medication Reconciliation conducted with patient/caregiver? (Yes/ No):Yes  New medications prescribed/discontinued upon discharge? (Yes/No): No  Barriers identified related to medications: No  LABS  Lab Reviewed (Yes/No/NA): Yes  PHYSICAL EXAM:  Blood pressure 140/80, pulse 91, temperature 98.2 F (36.8 C), temperature source Oral, weight (!) 303 lb (137.4 kg), last menstrual period 10/16/2015, SpO2 93 %. Gen: 55yo F in NAD CV: RRR, no murmurs PULM: CTA ABD: Obese, S, NT, ND Ext: Trace peripheral edema Neuro: Alert and oriented. CN2-12 intact. Psych: Appropriate mood and affect.    ASSESSMENT:  Panic attack Discussed starting SSRI, however patient deferred. Will re-address in the future if continues to be an issue. Patient not interested in any other forms of treatment at this time.   Abdominal pain, chronic, right upper quadrant Will check urea breath test today to ensure adequate h pylori treatment. Had had extensive work up in the past which has been largely negative including RUQ [place] and HIDA scan. Lipase also negative. Will refer to GI for further evaluation - also needs colonoscopy. She did have NAFLD on her RUQ [place], though it is unclear how much that is contributing.   Hypertension At goal. Will follow up with cardiology in 2 days. Consider stopping pindolol and switch to non  beta blocker given that she has a history of sinus pauses, though will defer to cardiology given that this was started by them while she was hospitalized.   AKI (acute kidney injury) ([place]) Recheck BMP today.   Morbid obesity ([place]) Referral again placed to bariatric surgery today per her and daughter's request. BMI 52.01.   PATIENT EDUCATION PROVIDED: See AVS   FOLLOW-UP (Include any further testing or referrals): 3-4 weeks at [facility]

## 2016-12-08 NOTE — Progress Notes (Signed)
Cardiology Office Note    Date:  12/10/2016   ID:  Ernestine Rohman, DOB Sep 02, 1961, MRN [id number]  PCP:  Dimas Chyle, MD  Cardiologist:  Dr. Marlou Porch  CC: follow up diastolic CHF   History of Present Illness:  Hailey Barnes is a 56 y.o. female with a history of COPD, HTN, HLD, morbid obesity, T2DM, prior CVA, tobacco abuse, chronic diastolic CHF and OSA who presents to clinic for follow up.   Admitted 5/4-03/24/02 for A/C diastolic CHF. Diuresed from 319--> 300 lbs .Echo on 10/23/2016 with EF 75%, severe LVH & G1DD. She was noted to have sinus bradycardia with sinus pauses up to 4 seconds. Coreg was discontinued and this resolved. However, her HR went up to 90's prior to discharge. She was started on Pindolol by cardiology and discharged on this.   She was seen by Dr. Marlou Porch on 11/26/16 for post hosptial follow up. SHe was volume overloaded. She was continued on Torsemide 60m BID and metolazone 519m(monday and Thursday) was added.   She was subsequently admitted 2/13-2/15/18 for RUQ and dyspnea. Etiology of RUQ was never determined despite extensive w/u. She also had chest tightness and dyspnea felt related to bronchospasm. VQ negative for PE. She ruled out for MI and BNP was normal. She was hypovolemic with an AKI on admission, so diuretics were held. Home torsemide was resumed on discharge and metolazone continued to be held. Lisinopril and amlodipine also held.   Today she presents to clinic for follow up. She has been only taking torsemide 6061maily. She has not been taking metolazone. Still having RUQ pain but recently diagnosed with H pylori and will start taking antibiotics for that. Also having some stinging up the back of her head. No SOB. No LE edema, orthopnea or PND. No dizziness or syncope. No blood in stool or urine. No palpitations. Has been having chest pain that is intermittent and comes at rest. Not worse with exertion. It is sharp in character and lasts a few seconds. No  associated SOB or diaphoresis. Sometimes gets nauseated. Down to 10 cigs a day.      Past Medical History:  Diagnosis Date  . Arthritis    "knees" (02/21/2014), back  . Chest pressure 10/22/2016  . CHF (congestive heart failure) ([facility] . Chronic bronchitis ([facility]  "get it q yr" (02/21/2014)  . Chronic diastolic CHF (congestive heart failure) ([facility] [place]  a. Echo 05/2014: EF 65-70% with Grade 1 DD.  . CMarland Kitchenronic lower back pain   . Complication of anesthesia    "I don't come out well; I chew on my tongue"  . COPD (chronic obstructive pulmonary disease) ([facility] [place] . Diabetes mellitus without complication ([facility] . Fibroid   . GERD (gastroesophageal reflux disease)   . Heart murmur   . Hypertension   . Infection of right eye    current dx on Saturday 7/15 - using drops  . Migraine 1982-2009  . MVP (mitral valve prolapse)   . Neuromuscular disorder (HCCR[facility]  right leg nerve pain  . Shortness of breath   . Sinus pause    a. noted on telemetry during admission from 10/2016, lasting up to 4.4 seconds. BB discontinued.   . Sleep apnea 02/2016   recent hospitalization noted apnea during stay no sleep studt performed since this episode  . [facility]   "light", LLE weakness remains (03/30/2014)  . 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 Tingling in extremities 12/12/2010   Uric acid and electrolytes (02/23) normal but WBC elevated.   D/Dx: carpal tunnel, ulnar neuropathy Less likely: cervical radiculopathy, vasculitis     Past Surgical History:  Procedure Laterality Date  . [place]  . DILATATION & CURETTAGE/HYSTEROSCOPY WITH MYOSURE N/A 05/06/2016   Procedure: DILATATION & CURETTAGE/HYSTEROSCOPY WITH MYOSURE;  Surgeon: Terrance Mass, MD;  Location: [facility];  Service: Gynecology;  Laterality: N/A;  request to follow around 8:45  requests one hour OR time  . DILATION AND CURETTAGE OF UTERUS    . FOOT SURGERY  Bilateral    "took bones out; put pins in"  . LEFT AND RIGHT HEART CATHETERIZATION WITH CORONARY ANGIOGRAM N/A 03/31/2014   Procedure: LEFT AND RIGHT HEART CATHETERIZATION WITH CORONARY ANGIOGRAM;  Surgeon: Birdie Riddle, MD;  Location: [facility];  Service: Cardiovascular;  Laterality: N/A;  . MYOMECTOMY    . sonohystogram  04/14/2016   [facility]: intramural fibroid, premenopausal endometrium, right fluid filled tubular structure    Current Medications: Outpatient Medications Prior to Visit  Medication Sig Dispense Refill  . albuterol (PROVENTIL HFA;VENTOLIN HFA) 108 (90 Base) MCG/ACT inhaler Inhale 2 puffs into the lungs every 6 (six) hours as needed for wheezing or shortness of breath.     Marland Kitchen albuterol (PROVENTIL) (2.5 MG/3ML) 0.083% nebulizer solution Take 3 mLs (2.5 mg total) by nebulization every 6 (six) hours as needed for wheezing or shortness of breath. 150 mL 1  . amoxicillin (AMOXIL) 500 MG tablet Take 2 tablets (1,000 mg total) by mouth 2 (two) times daily. 56 tablet 0  . aspirin 81 MG tablet Take 81 mg by mouth daily.    Marland Kitchen atorvastatin (LIPITOR) 40 MG tablet TAKE ONE TABLET BY MOUTH ONCE DAILY 90 tablet 3  . Blood Glucose Monitoring Suppl (ACCU-CHEK AVIVA PLUS) w/Device KIT Use daily as needed to check blood sugar. 1 kit 0  . glucose blood (ACCU-CHEK AVIVA) test strip Use daily as needed to check blood sugar. 100 each 12  . hydrocortisone 1 % ointment Apply 1 application topically 2 (two) times daily as needed (eczema on face). 30 g 1  . Lancet Devices (ACCU-CHEK SOFTCLIX) lancets Use daily as needed to check blood sugar. 1 each 11  . levofloxacin (LEVAQUIN) 500 MG tablet Take 1 tablet (500 mg total) by mouth daily. 14 tablet 0  . magnesium chloride (SLOW-MAG) 64 MG TBEC SR tablet Take 2 tablets (128 mg total) by mouth daily. 60 tablet 0  . metFORMIN (GLUCOPHAGE) 1000 MG tablet Take 1 tablet (1,000 mg total) by mouth 2 (two) times daily with a meal. 180  tablet 3  . mometasone-formoterol (DULERA) 200-5 MCG/ACT AERO Inhale 2 puffs into the lungs 2 (two) times daily.    . nicotine (NICODERM CQ - DOSED IN MG/24 HOURS) 21 mg/24hr patch Place 1 patch (21 mg total) onto the skin daily. 28 patch 0  . omeprazole (PRILOSEC) 20 MG capsule Take 1 capsule (20 mg total) by mouth 2 (two) times daily. 28 capsule 0  . potassium chloride 20 MEQ TBCR Take 20 mEq by mouth daily. 30 tablet 0  . Spacer/Aero-Holding Chambers (E-Z SPACER) inhaler Use as instructed 1 each 2  . tiotropium (SPIRIVA) 18 MCG inhalation capsule Place 1 capsule (18 mcg total) into inhaler and inhale daily. 30 capsule 1  . triamcinolone cream (KENALOG) 0.1 % Apply 1 application topically  3 (three) times daily as needed (eczema on arms). 30 g 1  . pindolol (VISKEN) 5 MG tablet Take 0.5 tablets (2.5 mg total) by mouth 2 (two) times daily. (Patient taking differently: Take 2.5 mg by mouth daily. ) 30 tablet 0  . torsemide (DEMADEX) 20 MG tablet Take 3 tablets (60 mg total) by mouth 2 (two) times daily. (Patient not taking: Reported on 12/10/2016) 180 tablet 0   No facility-administered medications prior to visit.      Allergies:   Patient has no known allergies.   Social History   Social History  . Marital status: Single    Spouse name: N/A  . Number of children: N/A  . Years of education: N/A   Social History Main Topics  . Smoking status: Current Every Day Smoker    Packs/day: 0.25    Years: 37.00    Types: Cigarettes  . Smokeless tobacco: Never Used     Comment: Would like to quit  . Alcohol use No  . Drug use: No     Comment: 03/30/2014 "stopped using crack 02/21/2014"  . Sexual activity: Yes    Birth control/ protection: Post-menopausal   Other Topics Concern  . None   Social History Narrative   Married 11/16/2009 to [place] whom she met at [employer].    Enjoys church at [employer], [place] geared towards people with substance abuse history.       Financial assistance  approved for 100% discount at [facility] and has [facility] card Bonna Gains   June 27, 2010 11:36 am     Family History:  The patient'sfamily history includes COPD in her mother and sister; Cancer in her sister; Hypertension in her father and mother.     ROS:   Please see the history of present illness.    ROS All other systems reviewed and are negative.   PHYSICAL EXAM:   VS:  BP 124/84   Pulse 84   Ht 5' 4"  (1.626 m)   Wt (!) 301 lb 6.4 oz (136.7 kg)   LMP 10/16/2015   SpO2 96%   BMI 51.74 kg/m    GEN: Well nourished, well developed, in no acute distress, morbidly obese HEENT: normal  Neck: no JVD, carotid bruits, or masses Cardiac: RRR; no murmurs, rubs, or gallops,no edema  Respiratory:  clear to auscultation bilaterally, normal work of breathing GI: soft, nontender, nondistended, + BS MS: no deformity or atrophy  Skin: warm and dry, no rash Neuro:  Alert and Oriented x 3, Strength and sensation are intact Psych: euthymic mood, full affect   Wt Readings from Last 3 Encounters:  12/10/16 (!) 301 lb 6.4 oz (136.7 kg)  12/08/16 (!) 303 lb (137.4 kg)  12/04/16 (!) 302 lb 8 oz (137.2 kg)      Studies/Labs Reviewed:   EKG:  EKG is NOT ordered today.    Recent Labs: 12/02/2016: ALT 38; B Natriuretic Peptide 13.9; Hemoglobin 16.4; Platelets 198 12/03/2016: Magnesium 2.2 12/08/2016: BUN 23; Creat 1.21; Potassium 3.4; Sodium 142   Lipid Panel    Component Value Date/Time   CHOL 129 12/04/2016 0341   TRIG 249 (H) 12/04/2016 0341   HDL 30 (L) 12/04/2016 0341   CHOLHDL 4.3 12/04/2016 0341   VLDL 50 (H) 12/04/2016 0341   LDLCALC 49 12/04/2016 0341    Additional studies/ records that were reviewed today include:  2D ECHO: 10/23/2016 LV EF: 75% Study Conclusions - Left ventricle: The cavity size is small. Wall thickness  was   increased in a pattern of severe LVH. Systolic function was   vigorous. The estimated ejection fraction was 75%. Wall motion   was normal; there  were no regional wall motion abnormalities.   Doppler parameters are consistent with abnormal left ventricular   relaxation (grade 1 diastolic dysfunction). The E/e&' ratio is   >15, suggesting elevated LV filling pressure. - Left atrium: The atrium was normal in size. - Inferior vena cava: The vessel was normal in size. The   respirophasic diameter changes were in the normal range (>= 50%),   consistent with normal central venous pressure. Impressions: - Compard to a prior study in 2015, the LVEF is higher at 75%.   There is now severe LV wall thickening, diastolic dysfunction and   elevated LV filling pressure.    ASSESSMENT & PLAN:   Chronic diastolic CHF: appears euvolemic on toresemide 80m daily. Discussed importance of good BP control  Morbid obesity: Body mass index is 51.74 kg/m. interested in bariatric surgery. Encouraged this and diet and exercise.  OSA: missed her CPAP appt when admitted. Discussed importance of this and she will reschedule.   HTN: BP well controlled today. Discussed severe LVH on last echo.  Will increase pindolol from 2.566mBID to 54m4mID. Lisinopril 20 mg held at most recent discharge. Will resume if kidney function stable on BMET.   HLD: continue statin   T2DM: last HgA1c 7.3. Continue current regiemen  Hx of CVA: continue statin.   Tobacco abuse: working on quitting   Chest pain: very atypical. Continue to monitor  Medication Adjustments/Labs and Tests Ordered: Current medicines are reviewed at length with the patient today.  Concerns regarding medicines are outlined above.  Medication changes, Labs and Tests ordered today are listed in the Patient Instructions below. Patient Instructions  Medication Instructions:  Your physician has recommended you make the following change in your medication:  1.  INCREASE the Pindolol to 5 mg taking 1 tablet twice a day  Labwork: TODAY:  BMET  Testing/Procedures: None ordered  Follow-Up: Your  physician recommends that you schedule a follow-up appointment in: 3 MONTHS WITH DR. SKAMarlou PorchAny Other Special Instructions Will Be Listed Below (If Applicable).     If you need a refill on your cardiac medications before your next appointment, please call your pharmacy.      Signed, KatAngelena FormA-C  12/10/2016 2:18 PM    [facility] [address]  [id number]: [phone]: [phone]

## 2016-12-08 NOTE — Assessment & Plan Note (Signed)
Discussed starting SSRI, however patient deferred. Will re-address in the future if continues to be an issue. Patient not interested in any other forms of treatment at this time.

## 2016-12-08 NOTE — Patient Instructions (Signed)
We will check for H pylori today.  We will refer for bariatric surgery.   We will refer to GI for her abdominal pain and colonoscopy.  We will check blood work today.  Come back to see me in 2-4 weeks.  Take care,  Dr [name]

## 2016-12-09 ENCOUNTER — Encounter: Payer: Self-pay | Admitting: Gastroenterology

## 2016-12-09 LAB — H. PYLORI BREATH TEST: H. PYLORI BREATH TEST: DETECTED — AB

## 2016-12-09 NOTE — Telephone Encounter (Signed)
Pts mother informed of forms ready for pick up. Pt is to come on her lunch break this afternoon.

## 2016-12-10 ENCOUNTER — Other Ambulatory Visit: Payer: Medicare HMO | Admitting: *Deleted

## 2016-12-10 ENCOUNTER — Telehealth: Payer: Self-pay | Admitting: Family Medicine

## 2016-12-10 ENCOUNTER — Encounter: Payer: Self-pay | Admitting: Physician Assistant

## 2016-12-10 ENCOUNTER — Ambulatory Visit (INDEPENDENT_AMBULATORY_CARE_PROVIDER_SITE_OTHER): Payer: Medicare HMO | Admitting: Physician Assistant

## 2016-12-10 VITALS — BP 124/84 | HR 84 | Ht 64.0 in | Wt 301.4 lb

## 2016-12-10 DIAGNOSIS — E118 Type 2 diabetes mellitus with unspecified complications: Secondary | ICD-10-CM

## 2016-12-10 DIAGNOSIS — Z8673 Personal history of transient ischemic attack (TIA), and cerebral infarction without residual deficits: Secondary | ICD-10-CM | POA: Diagnosis not present

## 2016-12-10 DIAGNOSIS — I110 Hypertensive heart disease with heart failure: Secondary | ICD-10-CM | POA: Diagnosis not present

## 2016-12-10 DIAGNOSIS — I5032 Chronic diastolic (congestive) heart failure: Secondary | ICD-10-CM

## 2016-12-10 MED ORDER — AMOXICILLIN 500 MG PO TABS: 1000.0000 mg | ORAL_TABLET | Freq: Two times a day (BID) | ORAL | 0 refills | Status: DC

## 2016-12-10 MED ORDER — LEVOFLOXACIN 500 MG PO TABS
500.0000 mg | ORAL_TABLET | Freq: Every day | ORAL | 0 refills | Status: DC
Start: 2016-12-10 — End: 2017-02-17

## 2016-12-10 MED ORDER — PINDOLOL 5 MG PO TABS: 5.0000 mg | ORAL_TABLET | Freq: Two times a day (BID) | ORAL | 3 refills | Status: DC

## 2016-12-10 MED ORDER — OMEPRAZOLE 20 MG PO CPDR: 20.0000 mg | DELAYED_RELEASE_CAPSULE | Freq: Two times a day (BID) | ORAL | 0 refills | Status: DC

## 2016-12-10 NOTE — Addendum Note (Signed)
Addended by: Eulis Foster on: 12/10/2016 02:24 PM   Modules accepted: Orders

## 2016-12-10 NOTE — Patient Instructions (Addendum)
Medication Instructions:  Your physician has recommended you make the following change in your medication:  1.  INCREASE the Pindolol to 5 mg taking 1 tablet twice a day  Labwork: TODAY:  BMET  Testing/Procedures: None ordered  Follow-Up: Your physician recommends that you schedule a follow-up appointment in: 3 MONTHS WITH DR. Marlou Porch   Any Other Special Instructions Will Be Listed Below (If Applicable).     If you need a refill on your cardiac medications before your next appointment, please call your pharmacy.

## 2016-12-10 NOTE — Telephone Encounter (Signed)
Called patient to discuss results. Notable for positive H pylori despite triple therapy with clarithromycin. Will attempt salvage treatment with levaquin based therapy per up to date recommendations. Will treat with 14 day course of omeprazole, levaquin, and amoxicillin. Patient will return 2 weeks after finishing treatment course for test of cure.  [name]. [name], [place] Resident PGY-3 12/10/2016 11:14 AM

## 2016-12-11 ENCOUNTER — Telehealth: Payer: Self-pay | Admitting: *Deleted

## 2016-12-11 LAB — BASIC METABOLIC PANEL
BUN / CREAT RATIO: 13 (ref 9–23)
BUN: 15 mg/dL (ref 6–24)
CO2: 26 mmol/L (ref 18–29)
CREATININE: 1.12 mg/dL — AB (ref 0.57–1.00)
Calcium: 9.6 mg/dL (ref 8.7–10.2)
Chloride: 95 mmol/L — ABNORMAL LOW (ref 96–106)
GFR calc Af Amer: 65 (ref >59)
GFR, EST NON AFRICAN AMERICAN: 56 — AB (ref >59)
GLUCOSE: 98 mg/dL (ref 65–99)
Potassium: 3.4 mmol/L — ABNORMAL LOW (ref 3.5–5.2)
SODIUM: 145 mmol/L — AB (ref 134–144)

## 2016-12-11 MED ORDER — LISINOPRIL 20 MG PO TABS: 20.0000 mg | ORAL_TABLET | Freq: Every day | ORAL | 3 refills | Status: DC

## 2016-12-11 NOTE — Telephone Encounter (Signed)
Pt aware of her lab results and she will start the Lisinopril 20 mg daily. rx sent into her pharmacy, [facility], upon her request. Pt verbalized understanding.

## 2016-12-11 NOTE — Telephone Encounter (Signed)
-----   Message from Eileen Stanford, [place] sent at 12/11/2016  6:12 AM EST ----- Actually I wanted to have he restart her lisinopril 70m daily and this will help her potassium, so no need to take an extra potassium like I previously wrote. Just start lisinopril 230mdaily with a BMET in 2 weeks. Thank you!

## 2016-12-14 ENCOUNTER — Encounter (HOSPITAL_BASED_OUTPATIENT_CLINIC_OR_DEPARTMENT_OTHER): Payer: Medicare HMO

## 2016-12-18 ENCOUNTER — Telehealth: Payer: Self-pay | Admitting: Family Medicine

## 2016-12-18 NOTE — Telephone Encounter (Signed)
Has a yeast infection from taking antibotics.  Please call in diflucan to [place] at [facility].

## 2016-12-18 NOTE — Telephone Encounter (Signed)
Will forward to MD. Dajanay Northrup,CMA  

## 2016-12-19 MED ORDER — FLUCONAZOLE 150 MG PO TABS: 150.0000 mg | ORAL_TABLET | Freq: Once | ORAL | 1 refills | Status: AC

## 2016-12-19 NOTE — Telephone Encounter (Signed)
Patient is aware instructions from MD. Hailey Barnes

## 2016-12-19 NOTE — Telephone Encounter (Signed)
Sent in two doses of diflucan. She should take the first today, and the second in 3 days if not improved. If she still has symptoms after 2 doses, she needs an office appointment.  [name]. [name], [place] Resident PGY-3 12/19/2016 8:52 AM

## 2016-12-25 ENCOUNTER — Ambulatory Visit: Payer: Medicare HMO | Admitting: Family Medicine

## 2016-12-25 DIAGNOSIS — I5033 Acute on chronic diastolic (congestive) heart failure: Secondary | ICD-10-CM | POA: Diagnosis not present

## 2016-12-25 DIAGNOSIS — R269 Unspecified abnormalities of gait and mobility: Secondary | ICD-10-CM | POA: Diagnosis not present

## 2016-12-30 ENCOUNTER — Ambulatory Visit: Payer: Medicare HMO | Admitting: Family Medicine

## 2017-01-06 DIAGNOSIS — R269 Unspecified abnormalities of gait and mobility: Secondary | ICD-10-CM | POA: Diagnosis not present

## 2017-01-06 DIAGNOSIS — I5033 Acute on chronic diastolic (congestive) heart failure: Secondary | ICD-10-CM | POA: Diagnosis not present

## 2017-01-06 DIAGNOSIS — G4733 Obstructive sleep apnea (adult) (pediatric): Secondary | ICD-10-CM | POA: Diagnosis not present

## 2017-01-08 ENCOUNTER — Ambulatory Visit (INDEPENDENT_AMBULATORY_CARE_PROVIDER_SITE_OTHER): Payer: Medicare HMO | Admitting: Family Medicine

## 2017-01-08 ENCOUNTER — Encounter: Payer: Self-pay | Admitting: Family Medicine

## 2017-01-08 VITALS — BP 110/83 | HR 80 | Temp 97.5°F | Wt 300.0 lb

## 2017-01-08 DIAGNOSIS — M7989 Other specified soft tissue disorders: Secondary | ICD-10-CM

## 2017-01-08 DIAGNOSIS — M79645 Pain in left finger(s): Secondary | ICD-10-CM | POA: Diagnosis not present

## 2017-01-08 DIAGNOSIS — M79644 Pain in right finger(s): Secondary | ICD-10-CM | POA: Diagnosis not present

## 2017-01-08 NOTE — Progress Notes (Signed)
   Subjective:   Hailey Barnes is a 56 y.o. female with a history of HTN, diastolic CHF, COPD, T2 DM, morbid obesity here for same day appointment for bilateral thumb swelling and pain   Patient has noted bilateral thumb swelling and pain for the last 3 weeks that has been worsening. The right thumb is worse than the left thumb. She does not remember any injury or trauma to the area. She has noticed that it is somewhat erythematous. Pain is over the entire thumb all the way to the wrist. She isn't having any open sores or drainage, fevers. She does endorse limited motion in her thumbs, especially the right, that limits her ability to write and do other activities of daily living. She has never had anything like this before. She thought that it would get better so she had been waiting, but it has worsened. She thought it may be related to the [place] she was recently taking, but it has not improved after stopping that.  Review of Systems:  Per HPI.   Social History: Current smoker  Objective:  LMP 10/16/2015   Gen:  56 y.o. female in NAD HEENT: NCAT, MMM, anicteric sclerae CV: Regular rate Resp: Non-labored Ext: WWP, no edema MSK: Bilateral thumbs: Diffuse tender to palpation over bilateral thumbs, mild erythema noted diffusely. Unable to perform opposition or full extension with right thumb. Range of motion is painful on both thumbs.  Notable swelling present. Skin: No open wounds Neuro: Alert and oriented, speech normal      Assessment & Plan:     Hailey Barnes is a 56 y.o. female here for   1. Thumb swelling - Ambulatory referral to Orthopedic Surgery (able to see her this afternoon at [facility]) - unclear of etiology - not clearly gout given it is not limited to the joint, no evidence of cellulitis - Concern that patient cannot complete full range of motion anymore   Virginia Crews, MD MPH PGY-3,  [facility] 01/08/2017  11:22 AM

## 2017-01-13 ENCOUNTER — Ambulatory Visit: Payer: Medicare HMO | Admitting: Gastroenterology

## 2017-01-13 ENCOUNTER — Other Ambulatory Visit: Payer: Self-pay | Admitting: Family Medicine

## 2017-01-13 MED ORDER — TORSEMIDE 20 MG PO TABS: 60.0000 mg | ORAL_TABLET | Freq: Every day | ORAL | 11 refills | Status: DC

## 2017-01-13 MED ORDER — POTASSIUM CHLORIDE ER 20 MEQ PO TBCR: 20.0000 meq | EXTENDED_RELEASE_TABLET | Freq: Every day | ORAL | 0 refills | Status: DC

## 2017-01-13 NOTE — Telephone Encounter (Signed)
pt calling to request refill of:  Name of Medication(s):torsemide, potassium Pharmacy: [facility] Last visit:  01-08-17   Will route refill request to Clinic RN.  Discussed with patient policy to call pharmacy for future refills.  Also, discussed refills may take up to 48 hours to approve or deny.  Roseanna Rainbow

## 2017-01-25 DIAGNOSIS — I5033 Acute on chronic diastolic (congestive) heart failure: Secondary | ICD-10-CM | POA: Diagnosis not present

## 2017-01-25 DIAGNOSIS — R269 Unspecified abnormalities of gait and mobility: Secondary | ICD-10-CM | POA: Diagnosis not present

## 2017-01-26 ENCOUNTER — Telehealth: Payer: Self-pay | Admitting: Family Medicine

## 2017-01-26 ENCOUNTER — Telehealth: Payer: Self-pay

## 2017-01-26 DIAGNOSIS — M5416 Radiculopathy, lumbar region: Secondary | ICD-10-CM

## 2017-01-26 NOTE — Telephone Encounter (Signed)
Needs Dr. [name] to call imaging so that she can get her spinal inj. Give her a call to notify her that it is done or contact her if you do not understand this request.

## 2017-01-26 NOTE — Telephone Encounter (Signed)
Epidural ordered.

## 2017-01-26 NOTE — Telephone Encounter (Signed)
Pt called, is requesting a spinal injection.  Please advise.

## 2017-01-26 NOTE — Telephone Encounter (Signed)
Ordered and demographics faxed to [facility] at [phone].

## 2017-01-27 ENCOUNTER — Ambulatory Visit: Payer: Medicare HMO | Admitting: Family Medicine

## 2017-01-28 ENCOUNTER — Ambulatory Visit: Payer: Medicare HMO | Admitting: *Deleted

## 2017-01-28 ENCOUNTER — Ambulatory Visit
Admission: RE | Admit: 2017-01-28 | Discharge: 2017-01-28 | Disposition: A | Discharge status: Home / Self Care | Payer: Medicare HMO | Source: Ambulatory Visit | Attending: Family Medicine | Admitting: Family Medicine

## 2017-01-28 VITALS — BP 179/83 | HR 87

## 2017-01-28 DIAGNOSIS — M5416 Radiculopathy, lumbar region: Secondary | ICD-10-CM

## 2017-01-28 MED ORDER — IOPAMIDOL (ISOVUE-M 200) INJECTION 41%
1.0000 mL | Freq: Once | INTRAMUSCULAR | Status: AC
  Administered 2017-01-28: 1 mL via EPIDURAL

## 2017-01-28 MED ORDER — METHYLPREDNISOLONE ACETATE 40 MG/ML INJ SUSP (RADIOLOG
120.0000 mg | Freq: Once | INTRAMUSCULAR | Status: AC
  Administered 2017-01-28: 120 mg via EPIDURAL

## 2017-01-28 NOTE — Discharge Instructions (Signed)

## 2017-02-06 DIAGNOSIS — R269 Unspecified abnormalities of gait and mobility: Secondary | ICD-10-CM | POA: Diagnosis not present

## 2017-02-06 DIAGNOSIS — I5033 Acute on chronic diastolic (congestive) heart failure: Secondary | ICD-10-CM | POA: Diagnosis not present

## 2017-02-06 DIAGNOSIS — G4733 Obstructive sleep apnea (adult) (pediatric): Secondary | ICD-10-CM | POA: Diagnosis not present

## 2017-02-13 ENCOUNTER — Ambulatory Visit: Payer: Medicare HMO | Admitting: Gastroenterology

## 2017-02-17 ENCOUNTER — Ambulatory Visit (HOSPITAL_COMMUNITY)
Admission: RE | Admit: 2017-02-17 | Discharge: 2017-02-17 | Disposition: A | Discharge status: Home / Self Care | Payer: Medicare HMO | Source: Ambulatory Visit | Attending: Family Medicine | Admitting: Family Medicine

## 2017-02-17 ENCOUNTER — Encounter: Payer: Self-pay | Admitting: Gynecology

## 2017-02-17 ENCOUNTER — Encounter: Payer: Self-pay | Admitting: Family Medicine

## 2017-02-17 ENCOUNTER — Ambulatory Visit (INDEPENDENT_AMBULATORY_CARE_PROVIDER_SITE_OTHER): Payer: Medicare HMO | Admitting: Family Medicine

## 2017-02-17 ENCOUNTER — Ambulatory Visit (INDEPENDENT_AMBULATORY_CARE_PROVIDER_SITE_OTHER): Payer: Medicare HMO | Admitting: Gynecology

## 2017-02-17 VITALS — BP 122/60 | HR 96 | Temp 98.1°F | Ht 64.0 in | Wt 292.0 lb

## 2017-02-17 VITALS — BP 132/86 | Ht 64.0 in | Wt 292.4 lb

## 2017-02-17 DIAGNOSIS — N760 Acute vaginitis: Secondary | ICD-10-CM | POA: Diagnosis not present

## 2017-02-17 DIAGNOSIS — I10 Essential (primary) hypertension: Secondary | ICD-10-CM | POA: Diagnosis not present

## 2017-02-17 DIAGNOSIS — N898 Other specified noninflammatory disorders of vagina: Secondary | ICD-10-CM | POA: Diagnosis not present

## 2017-02-17 DIAGNOSIS — E119 Type 2 diabetes mellitus without complications: Secondary | ICD-10-CM | POA: Diagnosis not present

## 2017-02-17 DIAGNOSIS — N951 Menopausal and female climacteric states: Secondary | ICD-10-CM | POA: Diagnosis not present

## 2017-02-17 DIAGNOSIS — R079 Chest pain, unspecified: Secondary | ICD-10-CM

## 2017-02-17 DIAGNOSIS — Z113 Encounter for screening for infections with a predominantly sexual mode of transmission: Secondary | ICD-10-CM | POA: Diagnosis not present

## 2017-02-17 DIAGNOSIS — R9431 Abnormal electrocardiogram [ECG] [EKG]: Secondary | ICD-10-CM | POA: Insufficient documentation

## 2017-02-17 DIAGNOSIS — N950 Postmenopausal bleeding: Secondary | ICD-10-CM | POA: Diagnosis not present

## 2017-02-17 DIAGNOSIS — B9689 Other specified bacterial agents as the cause of diseases classified elsewhere: Secondary | ICD-10-CM | POA: Diagnosis not present

## 2017-02-17 DIAGNOSIS — G4700 Insomnia, unspecified: Secondary | ICD-10-CM | POA: Insufficient documentation

## 2017-02-17 LAB — WET PREP FOR TRICH, YEAST, CLUE
TRICH WET PREP: NONE SEEN
Yeast Wet Prep HPF POC: NONE SEEN

## 2017-02-17 LAB — HEPATITIS C ANTIBODY: HCV Ab: NEGATIVE

## 2017-02-17 LAB — POCT GLYCOSYLATED HEMOGLOBIN (HGB A1C): HEMOGLOBIN A1C: 6.9

## 2017-02-17 LAB — HEPATITIS B SURFACE ANTIGEN: Hepatitis B Surface Ag: NEGATIVE

## 2017-02-17 MED ORDER — HYDROXYZINE HCL 50 MG PO TABS: 50.0000 mg | ORAL_TABLET | Freq: Every day | ORAL | 0 refills | Status: DC

## 2017-02-17 MED ORDER — METRONIDAZOLE 500 MG PO TABS: 500.0000 mg | ORAL_TABLET | Freq: Two times a day (BID) | ORAL | 0 refills | Status: DC

## 2017-02-17 NOTE — Assessment & Plan Note (Addendum)
Sleep onset and sleep maintenance insomnia. She is compliant with CPAP, but her usage rates read low due to her overall lack of sleep. She is getting about 4 hours of usage per nite. Her OSA symptoms are marginally improved. Discussed sleep hygiene with patient today. Will start hydroxyzine for use at bed time as needed. Follow up in 1-2 weeks if not improving.

## 2017-02-17 NOTE — Assessment & Plan Note (Signed)
At goal. Continue lisinopril.

## 2017-02-17 NOTE — Patient Instructions (Addendum)
Use your bed only for sleep. Avoid drinking caffeine afternoon. Avoid watching TV in bed. Use the hydroxyzine to help you sleep. If your sleep is not improving in 2-3 weeks, let us know.  We will send you for a stress test.  Your blood sugar and blood pressure are good today.   Come back to see me in 3 months if everything else is going well.  Take care,  Dr [name]

## 2017-02-17 NOTE — Patient Instructions (Addendum)
Metronidazole tablets or capsules What is this medicine? METRONIDAZOLE (me troe NI da zole) is an antiinfective. It is used to treat certain kinds of bacterial and protozoal infections. It will not work for colds, flu, or other viral infections. This medicine may be used for other purposes; ask your health care provider or pharmacist if you have questions. COMMON BRAND NAME(S): Flagyl What should I tell my health care provider before I take this medicine? They need to know if you have any of these conditions: -anemia or other blood disorders -disease of the nervous system -fungal or yeast infection -if you drink alcohol containing drinks -liver disease -seizures -an unusual or allergic reaction to metronidazole, or other medicines, foods, dyes, or preservatives -pregnant or trying to get pregnant -breast-feeding How should I use this medicine? Take this medicine by mouth with a full glass of water. Follow the directions on the prescription label. Take your medicine at regular intervals. Do not take your medicine more often than directed. Take all of your medicine as directed even if you think you are better. Do not skip doses or stop your medicine early. Talk to your pediatrician regarding the use of this medicine in children. Special care may be needed. Overdosage: If you think you have taken too much of this medicine contact a poison control center or emergency room at once. NOTE: This medicine is only for you. Do not share this medicine with others. What if I miss a dose? If you miss a dose, take it as soon as you can. If it is almost time for your next dose, take only that dose. Do not take double or extra doses. What may interact with this medicine? Do not take this medicine with any of the following medications: -alcohol or any product that contains alcohol -amprenavir oral solution -cisapride -disulfiram -dofetilide -dronedarone -paclitaxel injection -pimozide -ritonavir oral  solution -sertraline oral solution -sulfamethoxazole-trimethoprim injection -thioridazine -ziprasidone This medicine may also interact with the following medications: -birth control pills -cimetidine -lithium -other medicines that prolong the QT interval (cause an abnormal heart rhythm) -phenobarbital -phenytoin -warfarin This list may not describe all possible interactions. Give your health care provider a list of all the medicines, herbs, non-prescription drugs, or dietary supplements you use. Also tell them if you smoke, drink alcohol, or use illegal drugs. Some items may interact with your medicine. What should I watch for while using this medicine? Tell your doctor or health care professional if your symptoms do not improve or if they get worse. You may get drowsy or dizzy. Do not drive, use machinery, or do anything that needs mental alertness until you know how this medicine affects you. Do not stand or sit up quickly, especially if you are an older patient. This reduces the risk of dizzy or fainting spells. Avoid alcoholic drinks while you are taking this medicine and for three days afterward. Alcohol may make you feel dizzy, sick, or flushed. If you are being treated for a sexually transmitted disease, avoid sexual contact until you have finished your treatment. Your sexual partner may also need treatment. What side effects may I notice from receiving this medicine? Side effects that you should report to your doctor or health care professional as soon as possible: -allergic reactions like skin rash or hives, swelling of the face, lips, or tongue -confusion, clumsiness -difficulty speaking -discolored or sore mouth -dizziness -fever, infection -numbness, tingling, pain or weakness in the hands or feet -trouble passing urine or change in the amount of  urine -redness, blistering, peeling or loosening of the skin, including inside the mouth -seizures -unusually weak or  tired -vaginal irritation, dryness, or discharge Side effects that usually do not require medical attention (report to your doctor or health care professional if they continue or are bothersome): -diarrhea -headache -irritability -metallic taste -nausea -stomach pain or cramps -trouble sleeping This list may not describe all possible side effects. Call your doctor for medical advice about side effects. You may report side effects to FDA at 1-800-FDA-1088. Where should I keep my medicine? Keep out of the reach of children. Store at room temperature below 25 degrees C (77 degrees F). Protect from light. Keep container tightly closed. Throw away any unused medicine after the expiration date. NOTE: This sheet is a summary. It may not cover all possible information. If you have questions about this medicine, talk to your doctor, pharmacist, or health care provider.  2018 Elsevier/Gold Standard (2013-05-13 14:08:39) Bacterial Vaginosis Bacterial vaginosis is a vaginal infection that occurs when the normal balance of bacteria in the vagina is disrupted. It results from an overgrowth of certain bacteria. This is the most common vaginal infection among women ages 48-44. Because bacterial vaginosis increases your risk for STIs (sexually transmitted infections), getting treated can help reduce your risk for chlamydia, gonorrhea, herpes, and HIV (human immunodeficiency virus). Treatment is also important for preventing complications in pregnant women, because this condition can cause an early (premature) delivery. What are the causes? This condition is caused by an increase in harmful bacteria that are normally present in small amounts in the vagina. However, the reason that the condition develops is not fully understood. What increases the risk? The following factors may make you more likely to develop this condition:  Having a new sexual partner or multiple sexual partners.  Having unprotected  sex.  Douching.  Having an intrauterine device (IUD).  Smoking.  Drug and alcohol abuse.  Taking certain antibiotic medicines.  Being pregnant. You cannot get bacterial vaginosis from toilet seats, bedding, swimming pools, or contact with objects around you. What are the signs or symptoms? Symptoms of this condition include:  Grey or white vaginal discharge. The discharge can also be watery or foamy.  A fish-like odor with discharge, especially after sexual intercourse or during menstruation.  Itching in and around the vagina.  Burning or pain with urination. Some women with bacterial vaginosis have no signs or symptoms. How is this diagnosed? This condition is diagnosed based on:  Your medical history.  A physical exam of the vagina.  Testing a sample of vaginal fluid under a microscope to look for a large amount of bad bacteria or abnormal cells. Your health care provider may use a cotton swab or a small wooden spatula to collect the sample. How is this treated? This condition is treated with antibiotics. These may be given as a pill, a vaginal cream, or a medicine that is put into the vagina (suppository). If the condition comes back after treatment, a second round of antibiotics may be needed. Follow these instructions at home: Medicines   Take over-the-counter and prescription medicines only as told by your health care provider.  Take or use your antibiotic as told by your health care provider. Do not stop taking or using the antibiotic even if you start to feel better. General instructions   If you have a female sexual partner, tell her that you have a vaginal infection. She should see her health care provider and be treated if she  has symptoms. If you have a female sexual partner, he does not need treatment.  During treatment:  Avoid sexual activity until you finish treatment.  Do not douche.  Avoid alcohol as directed by your health care provider.  Avoid  breastfeeding as directed by your health care provider.  Drink enough water and fluids to keep your urine clear or pale yellow.  Keep the area around your vagina and rectum clean.  Wash the area daily with warm water.  Wipe yourself from front to back after using the toilet.  Keep all follow-up visits as told by your health care provider. This is important. How is this prevented?  Do not douche.  Wash the outside of your vagina with warm water only.  Use protection when having sex. This includes latex condoms and dental dams.  Limit how many sexual partners you have. To help prevent bacterial vaginosis, it is best to have sex with just one partner (monogamous).  Make sure you and your sexual partner are tested for STIs.  Wear cotton or cotton-lined underwear.  Avoid wearing tight pants and pantyhose, especially during summer.  Limit the amount of alcohol that you drink.  Do not use any products that contain nicotine or tobacco, such as cigarettes and e-cigarettes. If you need help quitting, ask your health care provider.  Do not use illegal drugs. Where to find more information:  Centers for Disease Control and Prevention: AppraiserFraud.fi  American Sexual Health Association (ASHA): www.ashastd.org  U.S. Department of Health and Financial controller, Office on Women's Health: DustingSprays.pl or SecuritiesCard.it Contact a health care provider if:  Your symptoms do not improve, even after treatment.  You have more discharge or pain when urinating.  You have a fever.  You have pain in your abdomen.  You have pain during sex.  You have vaginal bleeding between periods. Summary  Bacterial vaginosis is a vaginal infection that occurs when the normal balance of bacteria in the vagina is disrupted.  Because bacterial vaginosis increases your risk for STIs (sexually transmitted infections), getting treated can help reduce your  risk for chlamydia, gonorrhea, herpes, and HIV (human immunodeficiency virus). Treatment is also important for preventing complications in pregnant women, because the condition can cause an early (premature) delivery.  This condition is treated with antibiotic medicines. These may be given as a pill, a vaginal cream, or a medicine that is put into the vagina (suppository). This information is not intended to replace advice given to you by your health care provider. Make sure you discuss any questions you have with your health care provider. Document Released: 10/06/2005 Document Revised: 06/21/2016 Document Reviewed: 06/21/2016 Elsevier Interactive Patient Education  2017 [place].   Menopause and Hormone Replacement Therapy What is hormone replacement therapy? Hormone replacement therapy (HRT) is the use of artificial (synthetic) hormones to replace hormones that your body stops producing during menopause. Menopause is the normal time of life when menstrual periods stop completely and the ovaries stop producing the female hormones estrogen and progesterone. This lack of hormones can affect your health and cause undesirable symptoms. HRT can relieve some of those symptoms. What are my options for HRT? HRT may consist of the synthetic hormones estrogen and progestin, or it may consist of only estrogen (estrogen-only therapy). You and your health care provider will decide which form of HRT is best for you. If you choose to be on HRT and you have a uterus, estrogen and progestin are usually prescribed. Estrogen-only therapy is used for women  who do not have a uterus. Possible options for taking HRT include:  Pills.  Patches.  Gels.  Sprays.  Vaginal cream.  Vaginal rings.  Vaginal inserts. The amount of hormone(s) that you take and how long you take the hormone(s) varies depending on your individual health. It is important to:  Begin HRT with the lowest possible dosage.  Stop HRT  as soon as your health care provider tells you to stop.  Work with your health care provider so that you feel informed and comfortable with your decisions. What are the benefits of HRT? HRT can reduce the frequency and severity of menopausal symptoms. Benefits of HRT vary depending on the menopausal symptoms that you have, the severity of your symptoms, and your overall health. HRT may help to improve the following menopausal symptoms:  Hot flashes and night sweats. These are sudden feelings of heat that spread over the face and body. The skin may turn red, like a blush. Night sweats are hot flashes that happen while you are sleeping or trying to sleep.  Bone loss (osteoporosis). The body loses calcium more quickly after menopause, causing the bones to become weaker. This can increase the risk for bone breaks (fractures).  Vaginal dryness. The lining of the vagina can become thin and dry, which can cause pain during sexual intercourse or cause infection, burning, or itching.  Urinary tract infections.  Urinary incontinence. This is a decreased ability to control when you urinate.  Irritability.  Short-term memory problems. What are the risks of HRT? Risks of HRT vary depending on your individual health and medical history. Risks of HRT also depend on whether you receive both estrogen and progestin or you receive estrogen only.HRT may increase the risk of:  Spotting. This is when a small amount of bloodleaks from the vagina unexpectedly.  Endometrial cancer. This cancer is in the lining of the uterus (endometrium).  Breast cancer.  Increased density of breast tissue. This can make it harder to find breast cancer on a breast X-ray (mammogram).  Stroke.  Heart attack.  Blood clots.  Gallbladder disease. Risks of HRT can increase if you have any of the following conditions:  Endometrial cancer.  Liver disease.  Heart disease.  Breast cancer.  History of blood  clots.  History of stroke. How should I care for myself while I am on HRT?  Take over-the-counter and prescription medicines only as told by your health care provider.  Get mammograms, pelvic exams, and medical checkups as often as told by your health care provider.  Have Pap tests done as often as told by your health care provider. A Pap test is sometimes called a Pap smear. It is a screening test that is used to check for signs of cancer of the cervix and vagina. A Pap test can also identify the presence of infection or precancerous changes. Pap tests may be done:  Every 3 years, starting at age 16.  Every 5 years, starting after age 41, in combination with testing for human papillomavirus (HPV).  More often or less often depending on other medical conditions you have, your age, and other risk factors.  It is your responsibility to get your Pap test results. Ask your health care provider or the department performing the test when your results will be ready.  Keep all follow-up visits as told by your health care provider. This is important. When should I seek medical care? Talk with your health care provider if:  You have any of  these:  Pain or swelling in your legs.  Shortness of breath.  Chest pain.  Lumps or changes in your breasts or armpits.  Slurred speech.  Pain, burning, or bleeding when you urine.  You develop any of these:  Unusual vaginal bleeding.  Dizziness or headaches.  Weakness or numbness in any part of your arms or legs.  Pain in your abdomen. This information is not intended to replace advice given to you by your health care provider. Make sure you discuss any questions you have with your health care provider. Document Released: 07/05/2003 Document Revised: 09/02/2016 Document Reviewed: 04/09/2015 Elsevier Interactive Patient Education  2017 [place]. Menopause Menopause is the normal time of life when menstrual periods stop completely.  Menopause is complete when you have missed 12 consecutive menstrual periods. It usually occurs between the ages of 54 years and 9 years. Very rarely does a woman develop menopause before the age of 6 years. At menopause, your ovaries stop producing the female hormones estrogen and progesterone. This can cause undesirable symptoms and also affect your health. Sometimes the symptoms may occur 4-5 years before the menopause begins. There is no relationship between menopause and:  Oral contraceptives.  Number of children you had.  Race.  The age your menstrual periods started (menarche). Heavy smokers and very thin women may develop menopause earlier in life. What are the causes?  The ovaries stop producing the female hormones estrogen and progesterone. Other causes include:  Surgery to remove both ovaries.  The ovaries stop functioning for no known reason.  Tumors of the pituitary gland in the brain.  Medical disease that affects the ovaries and hormone production.  Radiation treatment to the abdomen or pelvis.  Chemotherapy that affects the ovaries. What are the signs or symptoms?  Hot flashes.  Night sweats.  Decrease in sex drive.  Vaginal dryness and thinning of the vagina causing painful intercourse.  Dryness of the skin and developing wrinkles.  Headaches.  Tiredness.  Irritability.  Memory problems.  Weight gain.  Bladder infections.  Hair growth of the face and chest.  Infertility. More serious symptoms include:  Loss of bone (osteoporosis) causing breaks (fractures).  Depression.  Hardening and narrowing of the arteries (atherosclerosis) causing heart attacks and strokes. How is this diagnosed?  When the menstrual periods have stopped for 12 straight months.  Physical exam.  Hormone studies of the blood. How is this treated? There are many treatment choices and nearly as many questions about them. The decisions to treat or not to treat  menopausal changes is an individual choice made with your health care provider. Your health care provider can discuss the treatments with you. Together, you can decide which treatment will work best for you. Your treatment choices may include:  Hormone therapy (estrogen and progesterone).  Non-hormonal medicines.  Treating the individual symptoms with medicine (for example antidepressants for depression).  Herbal medicines that may help specific symptoms.  Counseling by a psychiatrist or psychologist.  Group therapy.  Lifestyle changes including:  Eating healthy.  Regular exercise.  Limiting caffeine and alcohol.  Stress management and meditation.  No treatment. Follow these instructions at home:  Take the medicine your health care provider gives you as directed.  Get plenty of sleep and rest.  Exercise regularly.  Eat a diet that contains calcium (good for the bones) and soy products (acts like estrogen hormone).  Avoid alcoholic beverages.  Do not smoke.  If you have hot flashes, dress in layers.  Take  supplements, calcium, and vitamin D to strengthen bones.  You can use over-the-counter lubricants or moisturizers for vaginal dryness.  Group therapy is sometimes very helpful.  Acupuncture may be helpful in some cases. Contact a health care provider if:  You are not sure you are in menopause.  You are having menopausal symptoms and need advice and treatment.  You are still having menstrual periods after age 67 years.  You have pain with intercourse.  Menopause is complete (no menstrual period for 12 months) and you develop vaginal bleeding.  You need a referral to a specialist (gynecologist, psychiatrist, or psychologist) for treatment. Get help right away if:  You have severe depression.  You have excessive vaginal bleeding.  You fell and think you have a broken bone.  You have pain when you urinate.  You develop leg or chest pain.  You have  a fast pounding heart beat (palpitations).  You have severe headaches.  You develop vision problems.  You feel a lump in your breast.  You have abdominal pain or severe indigestion. This information is not intended to replace advice given to you by your health care provider. Make sure you discuss any questions you have with your health care provider. Document Released: 12/27/2003 Document Revised: 03/13/2016 Document Reviewed: 05/05/2013 Elsevier Interactive Patient Education  2017 [employer].

## 2017-02-17 NOTE — Assessment & Plan Note (Signed)
Atypical in nature. No current chest pain. EKG today without ischemic changes. Patient with several risk factors - obesity, HTN, HLD, tobacco abuse, HLD. Will send for stress testing.

## 2017-02-17 NOTE — Progress Notes (Addendum)
    Subjective:  Hailey Barnes is a 56 y.o. female who presents to the [facility] today with a chief complaint of insomnia.   HPI:  Insomnia Chronic problem for patient. She had a sleep study in January which confirmed the diagnosis of OSA. She has been using her CPAP machine for about 4 hours a night, though reports difficulty with both falling asleep and staying asleep. Patient reports that she has racing thoughts at night. She has tried taking melatonin which did not help. She drinks coffee in the morning and tea in the afternoon.   Chest Pain Patient also with occasional substernal chest pain that radiates to the right side of her chest. Pain occurs randomly and is not associated with exertion. She occasionally has some shortness of breath with it. No fevers or chills. No cough. No rash.   T2DM Currently on metformin. Doing well on her current dose without side effects. No polyuria or polydipsia. Has lost about 10 pounds over the past month. Has been working more on eating healthy diet.  Hypertension BP Readings from Last 3 Encounters:  02/17/17 122/60  02/17/17 132/86  01/28/17 (!) 179/83   Home BP monitoring-Yes Compliant with medications-yes, without side effects ROS-Denies any HA, blurry vision,transient weakness, orthopnea, PND.   ROS: Per HPI  PMH: Smoking history reviewed.   Objective:  Physical Exam: BP 122/60   Pulse 96   Temp 98.1 F (36.7 C) (Oral)   Ht 5' 4"  (1.626 m)   Wt 292 lb (132.5 kg)   LMP 10/15/2016 (Approximate)   SpO2 95%   BMI 50.12 kg/m   Gen: NAD, resting comfortably CV: RRR with no murmurs appreciated Pulm: NWOB, CTAB with no crackles, wheezes, or rhonchi GI: Obese, Normal bowel sounds present. Soft, Nontender, Nondistended. MSK: no edema, cyanosis, or clubbing noted Skin: warm, dry Neuro: grossly normal, moves all extremities Psych: Normal affect and thought content  No results found for this or any previous visit (from the past 72  hour(s)). EKG: NSR, no ischemic changes  Assessment/Plan:  Insomnia Sleep onset and sleep maintenance insomnia. She is compliant with CPAP, but her usage rates read low due to her overall lack of sleep. She is getting about 4 hours of usage per nite. Her OSA symptoms are marginally improved. Discussed sleep hygiene with patient today. Will start hydroxyzine for use at bed time as needed. Follow up in 1-2 weeks if not improving.   Chest pain Atypical in nature. No current chest pain. EKG today without ischemic changes. Patient with several risk factors - obesity, HTN, HLD, tobacco abuse, HLD. Will send for stress testing.   Hypertension At goal. Continue lisinopril.   T2DM (type 2 diabetes mellitus) (HCC) A1c improved to 6.9. Continue metformin. Continue working on weight loss and healthy lifestyle. Follow up in 3 months for repeat A1c.   [name]. [name], [place] Resident PGY-3 02/23/2017 10:41 AM

## 2017-02-17 NOTE — Progress Notes (Deleted)
    Subjective:  Hailey Barnes is a 56 y.o. female who presents to the [facility] today with a chief complaint of ***.   HPI:   ***HIST  Objective:  Physical Exam: Pulse 96   Temp 98.1 F (36.7 C) (Oral)   Ht 5' 4"  (1.626 m)   Wt 292 lb (132.5 kg)   LMP 10/15/2016 (Approximate)   SpO2 95%   BMI 50.12 kg/m   Gen: ***NAD, resting comfortably CV: RRR with no murmurs appreciated Pulm: NWOB, CTAB with no crackles, wheezes, or rhonchi GI: Normal bowel sounds present. Soft, Nontender, Nondistended. MSK: no edema, cyanosis, or clubbing noted Skin: warm, dry Neuro: grossly normal, moves all extremities Psych: Normal affect and thought content  No results found for this or any previous visit (from the past 72 hour(s)).   Assessment/Plan:  No problem-specific Assessment & Plan notes found for this encounter.   [name]. [name], [place] Resident PGY-3 02/17/2017 1:50 PM

## 2017-02-17 NOTE — Addendum Note (Signed)
Addended by: Dorothyann Gibbs on: 02/17/2017 12:24 PM   Modules accepted: Orders

## 2017-02-17 NOTE — Progress Notes (Signed)
   Patient is a 56 year old who had several complaints today. Patient is been complaining for the past several days a brownish bloody-like discharge and dyspareunia. She has a new partner that she's had for 6 months since the last time she had a full STD screen. Patient last year had resectoscopic myomectomy and during the evaluation she was noted to have bilateral hydrosalpinx. Patient used to have a cocaine addiction and has been drug free for 3 years. Patient stated that her last menstrual cycle was December 2017 and she's been complaining hot flashes, mood swings, and irritability.  Gen. appearance overweight female with the above-mentioned complaints Abdomen: Soft nontender no rebound or guarding Back: No CVA tenderness Pelvic: Bartholin urethra Skene was within normal limits Vagina brownish foul odor discharge was noted Cervix: No gross lesions on inspection no active bleeding Bimanual exam blood per limits of normal uterus nontender Difficult to assess her adnexa due to patient's abdominal girth but no tenderness elicited on bimanual exam Rectal exam not done  Wet prep few clue cells, few white blood cells and too numerous to count bacteria  Assessment/plan: #1 patient with signs and symptoms highly suspicious for the menopause and FSH will be drawn to confirm #2 clinical evidence of bacterial vaginosis will be treated Flagyl 500 mg twice a day for 7 days #3 because of #2 above and endometrial biopsy was not done as a result of her brownish bloody-like discharge we will wait till next week at time of sonohysterogram as well as for follow-up on the bilateral hydrosalpinx. Number for a full STD screening was done today to include GC and Chlamydia culture along with HIV, RPR, hepatitis B and C

## 2017-02-17 NOTE — Assessment & Plan Note (Signed)
A1c improved to 6.9. Continue metformin. Continue working on weight loss and healthy lifestyle. Follow up in 3 months for repeat A1c.

## 2017-02-18 ENCOUNTER — Other Ambulatory Visit: Payer: Self-pay | Admitting: Gynecology

## 2017-02-18 DIAGNOSIS — N939 Abnormal uterine and vaginal bleeding, unspecified: Secondary | ICD-10-CM

## 2017-02-18 LAB — GC/CHLAMYDIA PROBE AMP
CT Probe RNA: NOT DETECTED
GC Probe RNA: NOT DETECTED

## 2017-02-18 LAB — HIV ANTIBODY (ROUTINE TESTING W REFLEX): HIV 1&2 Ab, 4th Generation: NONREACTIVE

## 2017-02-18 LAB — RPR

## 2017-02-18 LAB — FOLLICLE STIMULATING HORMONE: FSH: 23.5 m[IU]/mL

## 2017-02-24 DIAGNOSIS — R269 Unspecified abnormalities of gait and mobility: Secondary | ICD-10-CM | POA: Diagnosis not present

## 2017-02-24 DIAGNOSIS — I5033 Acute on chronic diastolic (congestive) heart failure: Secondary | ICD-10-CM | POA: Diagnosis not present

## 2017-02-26 ENCOUNTER — Ambulatory Visit (INDEPENDENT_AMBULATORY_CARE_PROVIDER_SITE_OTHER): Payer: Medicare HMO

## 2017-02-26 ENCOUNTER — Ambulatory Visit (INDEPENDENT_AMBULATORY_CARE_PROVIDER_SITE_OTHER): Payer: Medicare HMO | Admitting: Gynecology

## 2017-02-26 DIAGNOSIS — N840 Polyp of corpus uteri: Secondary | ICD-10-CM

## 2017-02-26 DIAGNOSIS — N939 Abnormal uterine and vaginal bleeding, unspecified: Secondary | ICD-10-CM

## 2017-02-26 DIAGNOSIS — N950 Postmenopausal bleeding: Secondary | ICD-10-CM

## 2017-02-26 NOTE — Patient Instructions (Signed)
Hysteroscopy Hysteroscopy is a procedure used for looking inside the womb (uterus). It may be done for various reasons, including:  To evaluate abnormal bleeding, fibroid (benign, noncancerous) tumors, polyps, scar tissue (adhesions), and possibly cancer of the uterus.  To look for lumps (tumors) and other uterine growths.  To look for causes of why a woman cannot get pregnant (infertility), causes of recurrent loss of pregnancy (miscarriages), or a lost intrauterine device (IUD).  To perform a sterilization by blocking the fallopian tubes from inside the uterus. In this procedure, a thin, flexible tube with a tiny light and camera on the end of it (hysteroscope) is used to look inside the uterus. A hysteroscopy should be done right after a menstrual period to be sure you are not pregnant. LET [facility] CARE PROVIDER KNOW ABOUT:  Any allergies you have.  All medicines you are taking, including vitamins, herbs, eye drops, creams, and over-the-counter medicines.  Previous problems you or members of your family have had with the use of anesthetics.  Any blood disorders you have.  Previous surgeries you have had.  Medical conditions you have. RISKS AND COMPLICATIONS Generally, this is a safe procedure. However, as with any procedure, complications can occur. Possible complications include:  Putting a hole in the uterus.  Excessive bleeding.  Infection.  Damage to the cervix.  Injury to other organs.  Allergic reaction to medicines.  Too much fluid used in the uterus for the procedure. BEFORE THE PROCEDURE  Ask your health care provider about changing or stopping any regular medicines.  Do not take aspirin or blood thinners for 1 week before the procedure, or as directed by your health care provider. These can cause bleeding.  If you smoke, do not smoke for 2 weeks before the procedure.  In some cases, a medicine is placed in the cervix the day before the procedure. This  medicine makes the cervix have a larger opening (dilate). This makes it easier for the instrument to be inserted into the uterus during the procedure.  Do not eat or drink anything for at least 8 hours before the surgery.  Arrange for someone to take you home after the procedure. PROCEDURE  You may be given a medicine to relax you (sedative). You may also be given one of the following:  A medicine that numbs the area around the cervix (local anesthetic).  A medicine that makes you sleep through the procedure (general anesthetic).  The hysteroscope is inserted through the vagina into the uterus. The camera on the hysteroscope sends a picture to a TV screen. This gives the surgeon a good view inside the uterus.  During the procedure, air or a liquid is put into the uterus, which allows the surgeon to see better.  Sometimes, tissue is gently scraped from inside the uterus. These tissue samples are sent to a lab for testing. What to expect after the procedure  If you had a general anesthetic, you may be groggy for a couple hours after the procedure.  If you had a local anesthetic, you will be able to go home as soon as you are stable and feel ready.  You may have some cramping. This normally lasts for a couple days.  You may have bleeding, which varies from light spotting for a few days to menstrual-like bleeding for 3-7 days. This is normal.  If your test results are not back during the visit, make an appointment with your health care provider to find out the results. This  information is not intended to replace advice given to you by your health care provider. Make sure you discuss any questions you have with your health care provider. Document Released: 01/12/2001 Document Revised: 03/13/2016 Document Reviewed: 05/05/2013 Elsevier Interactive Patient Education  2017 [employer].

## 2017-02-26 NOTE — Progress Notes (Signed)
   Patient is a 56 year old presented to the office today as part of her evaluation for her bloody vaginal discharge and dyspareunia. She was seen in the office on 02/17/2017. We will going to do an endometrial biopsy which had a vaginal discharge and she had bacterial vaginosis and was placed on Flagyl 500 mg twice a day for 7 days and is here for possible endometrial biopsy along with a sonohysterogram. Patient had a normal Pap smear less than 12 months ago. She had a complete STD to include HIV, RPR, hepatitis B and C recently which was normal. She was a cocaine addict 3 years ago. At time of her evaluation last year before her surgery she was noted on ultrasound had bilateral hydrosalpinx.  Review of her record indicated that in July 2017 had a resectoscopic myomectomy for dysfunction uterine bleeding and pathology report demonstrated the following:  Diagnosis Endometrium, curettage, with polyp SCANT INACTIVE ENDOMETRIUM FRAGMENTS OF SUBMUCOSA LEIOMYOMA  She had done well until recently when she wiped she noted some blood but otherwise having still normal menstrual interestingly her Hornick recently was 70.  Ultrasound and some hysterogram today: Uterus measures 7.8 x 5.8 x 5.1 cm with endometrial stripe of 9.3 mm. Right ovary continued presence of a serpentine tubular cystic mass measuring 40 x 17 x 32 mm unchanged from previous scan the previous year. Thick walled follicle measuring 18 x 13 mm with negative color flow was noted left ovarian follicle 24 x 19 mm. Previous hydrosalpinx on the left not seen. No fluid in the cul-de-sac. The cervix was cleansed with Betadine solution and a sterile catheter was introduced into the uterine cavity and 11 x 5 mm and a 13 x 6 mm intrauterine defect was noted.  Assessment/plan: Patient with slight dysfunction bleeding attributed to endometrial polyp. Previous left hydrosalpinx longer seen persistent right hydrosalpinx unchanged in size in over year. Review of  patient's record indicated that last year she had resectoscopic myomectomy. Patient was very comfortable so we were not able to do the endometrial biopsy today we'll sample the endometrium at time of her resectoscopic polypectomy. This will be scheduled in next few weeks. We'll can medical clearance from her PCP prior to her surgery.

## 2017-02-27 ENCOUNTER — Encounter: Payer: Self-pay | Admitting: Gynecology

## 2017-03-04 ENCOUNTER — Encounter: Payer: Self-pay | Admitting: Gynecology

## 2017-03-08 DIAGNOSIS — G4733 Obstructive sleep apnea (adult) (pediatric): Secondary | ICD-10-CM | POA: Diagnosis not present

## 2017-03-08 DIAGNOSIS — I5033 Acute on chronic diastolic (congestive) heart failure: Secondary | ICD-10-CM | POA: Diagnosis not present

## 2017-03-08 DIAGNOSIS — R269 Unspecified abnormalities of gait and mobility: Secondary | ICD-10-CM | POA: Diagnosis not present

## 2017-03-17 ENCOUNTER — Encounter: Payer: Self-pay | Admitting: Cardiology

## 2017-03-17 ENCOUNTER — Encounter: Payer: Self-pay | Admitting: *Deleted

## 2017-03-17 ENCOUNTER — Ambulatory Visit (INDEPENDENT_AMBULATORY_CARE_PROVIDER_SITE_OTHER): Payer: Medicare HMO | Admitting: Cardiology

## 2017-03-17 VITALS — BP 146/98 | HR 88 | Ht 64.0 in | Wt 296.4 lb

## 2017-03-17 DIAGNOSIS — Z01810 Encounter for preprocedural cardiovascular examination: Secondary | ICD-10-CM | POA: Diagnosis not present

## 2017-03-17 DIAGNOSIS — I5032 Chronic diastolic (congestive) heart failure: Secondary | ICD-10-CM | POA: Diagnosis not present

## 2017-03-17 NOTE — Patient Instructions (Signed)
Medication Instructions:  Your physician recommends that you continue on your current medications as directed. Please refer to the Current Medication list given to you today.   Labwork: none  Testing/Procedures: none  Follow-Up: Your physician wants you to follow-up in: 6 months with K. Grandville Silos, [place].  You will receive a reminder letter in the mail two months in advance. If you don't receive a letter, please call our office to schedule the follow-up appointment.   Any Other Special Instructions Will Be Listed Below (If Applicable).     If you need a refill on your cardiac medications before your next appointment, please call your pharmacy.

## 2017-03-17 NOTE — Progress Notes (Signed)
Cardiology Office Note    Date:  03/17/2017   ID:  Hailey Barnes, DOB 28-Apr-1961, MRN [id number]  PCP:  Vivi Barrack, MD  Cardiologist:   Candee Furbish, MD     History of Present Illness:  Hailey Barnes is a 56 y.o. female seen during hospitalization on 10/23/16 for evaluation of chest discomfort, dyspnea in the setting of chronic diastolic heart failure EF 70%, COPD, hypertension, hyperlipidemia, morbid obesity, type 2 diabetes, prior stroke, tobacco use.She is here today for the evaluation preoperatively cardiovascular hysterectomy.  Her prior hospitalization in January 2018 was noteworthy for worsening edema worsening abdominal distention, IV Lasix administered. Her weight was 319. IV Lasix 80 mg twice a day was administered. Chest pain was quite atypical which improved after she massaged her chest. Worsened with deep breath. Troponins were all negative in the hospital.  She did have sinus pauses bradycardia max of 4.4 seconds with heart rates in the 30s to 40s. Her beta blocker carvedilol 25 mg twice a day was discontinued and her heart rate improved.  Chronic shortness of breath noted. No chest pain currently. Her weight is down to 292 pounds.  03/17/17-She is taking torsemide only 60 mg a day. Previously having sharp chest pain. Smoking 10 cigarettes a day.  She does experience edema occasionally right lower extremity greater than left. Today appears minimal/trace. Shortness of breath noted with activity which is chronic for her.  She has been experiencing dysfunctional uterine bleeding. She wishes to have procedure to have polyps removed she states.  Past Medical History:  Diagnosis Date  . Arthritis    "knees" (02/21/2014), back  . Chest pressure 10/22/2016  . CHF (congestive heart failure) ([place])   . Chronic bronchitis ([place])    "get it q yr" (02/21/2014)  . Chronic diastolic CHF (congestive heart failure) ([place])    a. Echo 05/2014: EF 65-70% with Grade 1 DD.  Marland Kitchen Chronic lower back  pain   . Complication of anesthesia    "I don't come out well; I chew on my tongue"  . COPD (chronic obstructive pulmonary disease) ([place])   . Diabetes mellitus without complication ([place])   . Fibroid   . GERD (gastroesophageal reflux disease)   . Heart murmur   . Hypertension   . Infection of right eye    current dx on Saturday 7/15 - using drops  . Migraine 1982-2009  . MVP (mitral valve prolapse)   . Neuromuscular disorder ([place])    right leg nerve pain  . Shortness of breath   . Sinus pause    a. noted on telemetry during admission from 10/2016, lasting up to 4.4 seconds. BB discontinued.   . Sleep apnea 02/2016   recent hospitalization noted apnea during stay no sleep studt performed since this episode  . Stroke [facility]) noted on CAT 02/2014   "light", LLE weakness remains (03/30/2014)  . 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 Tingling in extremities 12/12/2010   Uric acid and electrolytes (02/23) normal but WBC elevated.   D/Dx: carpal tunnel, ulnar neuropathy Less likely: cervical radiculopathy, vasculitis     Past Surgical History:  Procedure Laterality Date  . [place]  . DILATATION & CURETTAGE/HYSTEROSCOPY WITH MYOSURE N/A 05/06/2016   Procedure: DILATATION & CURETTAGE/HYSTEROSCOPY WITH MYOSURE;  Surgeon: Terrance Mass, MD;  Location: [facility];  Service: Gynecology;  Laterality: N/A;  request to follow around 8:45  requests one  hour OR time  . DILATION AND CURETTAGE OF UTERUS    . FOOT SURGERY Bilateral    "took bones out; put pins in"  . LEFT AND RIGHT HEART CATHETERIZATION WITH CORONARY ANGIOGRAM N/A 03/31/2014   Procedure: LEFT AND RIGHT HEART CATHETERIZATION WITH CORONARY ANGIOGRAM;  Surgeon: Birdie Riddle, MD;  Location: [facility];  Service: Cardiovascular;  Laterality: N/A;  . MYOMECTOMY    . sonohystogram  04/14/2016   [facility]: intramural fibroid, premenopausal endometrium,  right fluid filled tubular structure    Current Medications: Outpatient Medications Prior to Visit  Medication Sig Dispense Refill  . albuterol (PROVENTIL HFA;VENTOLIN HFA) 108 (90 Base) MCG/ACT inhaler Inhale 2 puffs into the lungs every 6 (six) hours as needed for wheezing or shortness of breath.     Marland Kitchen albuterol (PROVENTIL) (2.5 MG/3ML) 0.083% nebulizer solution Take 3 mLs (2.5 mg total) by nebulization every 6 (six) hours as needed for wheezing or shortness of breath. 150 mL 1  . aspirin 81 MG tablet Take 81 mg by mouth daily.    Marland Kitchen atorvastatin (LIPITOR) 40 MG tablet TAKE ONE TABLET BY MOUTH ONCE DAILY 90 tablet 3  . Blood Glucose Monitoring Suppl (ACCU-CHEK AVIVA PLUS) w/Device KIT Use daily as needed to check blood sugar. 1 kit 0  . glucose blood (ACCU-CHEK AVIVA) test strip Use daily as needed to check blood sugar. 100 each 12  . hydrocortisone 1 % ointment Apply 1 application topically 2 (two) times daily as needed (eczema on face). 30 g 1  . hydrOXYzine (ATARAX/VISTARIL) 50 MG tablet Take 1 tablet (50 mg total) by mouth at bedtime. 30 tablet 0  . Lancet Devices (ACCU-CHEK SOFTCLIX) lancets Use daily as needed to check blood sugar. 1 each 11  . magnesium chloride (SLOW-MAG) 64 MG TBEC SR tablet Take 2 tablets (128 mg total) by mouth daily. 60 tablet 0  . metFORMIN (GLUCOPHAGE) 1000 MG tablet Take 1 tablet (1,000 mg total) by mouth 2 (two) times daily with a meal. 180 tablet 3  . mometasone-formoterol (DULERA) 200-5 MCG/ACT AERO Inhale 2 puffs into the lungs 2 (two) times daily.    . pindolol (VISKEN) 5 MG tablet Take 1 tablet (5 mg total) by mouth 2 (two) times daily. 180 tablet 3  . Potassium Chloride ER 20 MEQ TBCR Take 20 mEq by mouth daily. 30 tablet 0  . Spacer/Aero-Holding Chambers (E-Z SPACER) inhaler Use as instructed 1 each 2  . tiotropium (SPIRIVA) 18 MCG inhalation capsule Place 1 capsule (18 mcg total) into inhaler and inhale daily. 30 capsule 1  . torsemide (DEMADEX) 20 MG  tablet Take 3 tablets (60 mg total) by mouth daily. Take 3 tablets by mouth daily 90 tablet 11  . triamcinolone cream (KENALOG) 0.1 % Apply 1 application topically 3 (three) times daily as needed (eczema on arms). 30 g 1  . lisinopril (PRINIVIL,ZESTRIL) 20 MG tablet Take 1 tablet (20 mg total) by mouth daily. 90 tablet 3   No facility-administered medications prior to visit.      Allergies:   Patient has no known allergies.   Social History   Social History  . Marital status: Single    Spouse name: N/A  . Number of children: N/A  . Years of education: N/A   Social History Main Topics  . Smoking status: Current Every Day Smoker    Packs/day: 0.50    Years: 37.00    Types: Cigarettes  . Smokeless tobacco: Never Used  Comment: Would like to quit  . Alcohol use No  . Drug use: No     Comment: 03/30/2014 "stopped using crack 02/21/2014"  . Sexual activity: Yes    Birth control/ protection: Post-menopausal   Other Topics Concern  . None   Social History Narrative   Married 11/16/2009 to Beaver whom she met at [employer].    Enjoys church at [employer], [place] geared towards people with substance abuse history.       Financial assistance approved for 100% discount at [facility] and has [facility] card Bonna Gains   June 27, 2010 11:36 am     Family History:  The patient's family history includes COPD in her mother and sister; Cancer in her sister; Hypertension in her father and mother.   ROS:   Please see the history of present illness.   Denies any syncope, orthopnea, PND. Positive for chronic shortness of breath, occasional back pain. Walking sometimes limited by bilateral hip pain. Review of Systems  All other systems reviewed and are negative.    PHYSICAL EXAM:   VS:  BP (!) 146/98   Pulse 88   Ht _0  (1.626 m)   Wt 296 lb 6.4 oz (134.4 kg)   LMP 10/15/2016 (Approximate)   BMI 50.88 kg/m    GEN: Well nourished, well developed, in no acute distress  HEENT: normal    Neck: no JVD, carotid bruits, or masses Cardiac: RRR; no murmurs, rubs, or gallops,trace edema  Respiratory:  clear to auscultation bilaterally, normal work of breathing GI: soft, nontender, nondistended, + BS, obese MS: no deformity or atrophy  Skin: warm and dry, no rash Neuro:  Alert and Oriented x 3, Strength and sensation are intact Psych: euthymic mood, full affect   Wt Readings from Last 3 Encounters:  03/17/17 296 lb 6.4 oz (134.4 kg)  02/17/17 292 lb (132.5 kg)  02/17/17 292 lb 6.4 oz (132.6 kg)      Studies/Labs Reviewed:   EKG:  02/17/17-sinus rhythm 91 with nonspecific ST changes.  Recent Labs: 12/02/2016: ALT 38; B Natriuretic Peptide 13.9; Hemoglobin 16.4; Platelets 198 12/03/2016: Magnesium 2.2 12/10/2016: BUN 15; Creatinine, Ser 1.12; Potassium 3.4; Sodium 145   Lipid Panel    Component Value Date/Time   CHOL 129 12/04/2016 0341   TRIG 249 (H) 12/04/2016 0341   HDL 30 (L) 12/04/2016 0341   CHOLHDL 4.3 12/04/2016 0341   VLDL 50 (H) 12/04/2016 0341   LDLCALC 49 12/04/2016 0341    Additional studies/ records that were reviewed today include:  Hospital records reviewed, lab work reviewed, EKG reviewed  Echocardiogram 10/23/16: - Left ventricle: The cavity size is small. Wall thickness was   increased in a pattern of severe LVH. Systolic function was   vigorous. The estimated ejection fraction was 75%. Wall motion   was normal; there were no regional wall motion abnormalities.   Doppler parameters are consistent with abnormal left ventricular   relaxation (grade 1 diastolic dysfunction). The E/e&' ratio is   >15, suggesting elevated LV filling pressure. - Left atrium: The atrium was normal in size. - Inferior vena cava: The vessel was normal in size. The   respirophasic diameter changes were in the normal range (>= 50%),   consistent with normal central venous pressure.  Impressions:  - Compard to a prior study in 2015, the LVEF is higher at 75%.    There is now severe LV wall thickening, diastolic dysfunction and   elevated LV filling pressure.  VQ scan 12/02/16: Low probability.   ASSESSMENT:    1. Chronic diastolic CHF (congestive heart failure) ([place])   2. Morbid obesity, unspecified obesity type ([place])   3. Pre-operative cardiovascular examination      PLAN:  In order of problems listed above:  Preoperative cardiac risk stratification  - Planned anesthesia for polyp removal.. Abnormal uterine bleeding.  - Has been able to complete greater than 4 METS of activity. She does have chronic shortness of breath with activity  - Does not have any signs of acute heart failure currently.  - She may proceed with surgery with no further cardiac testing. She is of moderate risk with risks mainly based upon her obesity.  Chronic diastolic heart failure  - Continue with torsemide 60 mg Once a day  - Fluid restrict 1-1-1/2 L  - Prior creatinine ranges from 1.32-1.13  Morbid obesity  - Congratulated her on joining the [employer] she seems motivated at this time. Weight has decreased. She has not been recently, encouragement.  Hypertensive heart disease with heart failure  - Blood pressure under better control. Multiple medications reviewed. Fluid balance will be helpful as well as weight loss.  Diabetes-hemoglobin A1c 6.8. Improved.  Tobacco use-working on quitting.  COPD - 3  Medication Adjustments/Labs and Tests Ordered: Current medicines are reviewed at length with the patient today.  Concerns regarding medicines are outlined above.  Medication changes, Labs and Tests ordered today are listed in the Patient Instructions below. There are no Patient Instructions on file for this visit.   Signed, Candee Furbish, MD  03/17/2017 11:23 AM    [facility] [address], [place], [place]  [address] Phone: [phone]; Fax: [phone]

## 2017-03-18 ENCOUNTER — Encounter: Payer: Self-pay | Admitting: Family Medicine

## 2017-03-18 ENCOUNTER — Ambulatory Visit (INDEPENDENT_AMBULATORY_CARE_PROVIDER_SITE_OTHER): Payer: Medicare HMO | Admitting: Family Medicine

## 2017-03-18 ENCOUNTER — Telehealth: Payer: Self-pay | Admitting: Family Medicine

## 2017-03-18 VITALS — BP 146/98 | HR 88 | Temp 98.2°F | Wt 297.0 lb

## 2017-03-18 DIAGNOSIS — R1011 Right upper quadrant pain: Secondary | ICD-10-CM

## 2017-03-18 DIAGNOSIS — G8929 Other chronic pain: Secondary | ICD-10-CM | POA: Diagnosis not present

## 2017-03-18 MED ORDER — ACETAMINOPHEN 500 MG PO TABS: 500.0000 mg | ORAL_TABLET | Freq: Four times a day (QID) | ORAL | 0 refills | Status: DC | PRN

## 2017-03-18 NOTE — Patient Instructions (Addendum)
Thank you for coming in today, it was so nice to see you! Today we talked about:    Abdominal pain: This pain could be a recurrence of your h pylori or something else. We will check your blood today to make sure nothing is wrong with your gallbladder or liver. I would also like for your to obtain an ultrasound of your right upper stomach to look at your gallbladder. We will also check for H. Pylori. I will call you if anything is positive  Take tylenol as needed for pain  Go to your GI appointment in June, we will follow along with them  Go to the ER if your pain gets worse, you have a fever, or if you cannot eat or drink anything all day   If you have any questions or concerns, please do not hesitate to call the office at [phone]. You can also message me directly via MyChart.   Sincerely,  Smitty Cords, MD

## 2017-03-18 NOTE — Telephone Encounter (Signed)
Pt saw Dr. Juanito Doom this morning, states she had a missed call from her before lunch. I didn't see any previous notes. Pt would like a call back. ep

## 2017-03-18 NOTE — Progress Notes (Signed)
Subjective:    Patient ID: Hailey Barnes , female   DOB: 1961-04-25 , 56 y.o..   MRN: [id number]  HPI  Hailey Barnes is here for  Chief Complaint  Patient presents with  . GI Problem    1. Abdominal pain, recurrent, right upper quadrant: Patient notes that for the last 2 weeks she has had right upper quadrant abdominal pain that is worse with eating and drinking. She admits to nausea but no vomiting. Denies any diarrhea or constipation. She notes that she has had this pain twice before and was diagnosed with H. pylori. She was treated most recently in February for H. pylori infection. Denies any heartburn symptoms or epigastric pain. Not on PPI or H2 blocker currently. Lying down makes the pain better. She does have an appointment with [facility] coming up on June 5.  Review of Systems: Per HPI.   Past Medical History: Patient Active Problem List   Diagnosis Date Noted  . Insomnia 02/17/2017  . Abdominal pain, chronic, right upper quadrant 12/03/2016  . OSA (obstructive sleep apnea) 10/30/2016  . Sinus pause 10/23/2016  . Chest pain 10/22/2016  . Panic attack 10/22/2016  . Lower extremity edema   . Nephrolithiasis 09/18/2016  . Lumbar radiculopathy 06/02/2016  . Hyperlipidemia 02/07/2016  . COPD exacerbation ([place]) 01/18/2016  . T2DM (type 2 diabetes mellitus) ([place]) 12/07/2015  . RUQ abdominal pain 09/21/2015  . Occipital lymphadenopathy 09/21/2015  . Hydrosalpinx 07/05/2015  . Depression 04/19/2015  . Abnormal uterine bleeding 12/07/2014  . Allergic rhinitis 08/01/2014  . Eczema 08/01/2014  . Diminished vision 06/23/2014  . Chronic diastolic CHF (congestive heart failure) ([place]) 06/13/2014  . Restrictive lung disease 05/10/2014  . NASH (nonalcoholic steatohepatitis) 01/04/2014  . Hypertension 05/26/2010  . Morbid obesity ([place]) 12/17/2006  . Tobacco abuse 12/17/2006    Medications: reviewed   Social Hx:  reports that she has been smoking Cigarettes.  She has a 18.50  pack-year smoking history. She has never used smokeless tobacco.   Objective:   BP (!) 146/98   Pulse 88   Temp 98.2 F (36.8 C) (Oral)   Wt 297 lb (134.7 kg)   LMP 10/15/2016 (Approximate)   SpO2 96%   BMI 50.98 kg/m  Physical Exam  Gen: NAD, alert, cooperative with exam, well-appearing HEENT: NCAT, PERRL, clear conjunctiva, oropharynx clear, supple neck, nonicteric sclera Cardiac: Regular rate and rhythm, capillary refill brisk  Respiratory: Clear to auscultation bilaterally, no wheezes, non-labored breathing Gastrointestinal: soft, obese abdomen, tender to palpation right upper quadrant, bowel sounds present Psych: good insight, normal mood and affect  Assessment & Plan:  Abdominal pain, chronic, right upper quadrant Leading differentials include symptomatic cholelithiasis vs recurrent H. pylori infection (this would be her 3rd infection if positive today). Abdominal exam difficult due to body habitus but is tender to palpation in RUQ. According to previous notes, patient has had extensive workup for this in the past including negative right upper quadrant ultrasound and HIDA scan. She was referred to GI and has appointment coming up on June 5. Discussed patient with Dr. Gwendlyn Deutscher and the plan is as follows: -Tylenol PRN for pain  - Urea breath test, CMP, RUQ U/S today - Initial GI visit on June 5th  Orders Placed This Encounter  Procedures  . US Abdomen Limited RUQ    Standing Status:   Future    Standing Expiration Date:   05/16/2018    Scheduling Instructions:     Route chart to pod RN for  prior authorization.  Referral coordinator will schedule after prior authorization is obtained.    Order Specific Question:   Reason for Exam (SYMPTOM  OR DIAGNOSIS REQUIRED)    Answer:   ruq abdominal pain    Order Specific Question:   Preferred imaging location?    Answer:   [facility]  . H. pylori breath test  . Comprehensive metabolic panel    Order Specific Question:    Has the patient fasted?    Answer:   No   Meds ordered this encounter  Medications  . acetaminophen (TYLENOL) 500 MG tablet    Sig: Take 1 tablet (500 mg total) by mouth every 6 (six) hours as needed.    Dispense:  30 tablet    Refill:  0    Smitty Cords, MD [place], PGY-2

## 2017-03-18 NOTE — Assessment & Plan Note (Addendum)
Leading differentials include symptomatic cholelithiasis vs recurrent H. pylori infection (this would be her 3rd infection if positive today). Abdominal exam difficult due to body habitus but is tender to palpation in RUQ. According to previous notes, patient has had extensive workup for this in the past including negative right upper quadrant ultrasound and HIDA scan. She was referred to GI and has appointment coming up on June 5. Discussed patient with Dr. Gwendlyn Deutscher and the plan is as follows: -Tylenol PRN for pain  - Urea breath test, CMP, RUQ U/S today - Initial GI visit on June 5th

## 2017-03-18 NOTE — Telephone Encounter (Signed)
I did not place a call to her. Thank you.

## 2017-03-19 LAB — COMPREHENSIVE METABOLIC PANEL
ALT: 28 IU/L (ref 0–32)
AST: 23 IU/L (ref 0–40)
Albumin/Globulin Ratio: 1.5 (ref 1.2–2.2)
Albumin: 4.1 g/dL (ref 3.5–5.5)
Alkaline Phosphatase: 104 IU/L (ref 39–117)
BILIRUBIN TOTAL: 0.3 mg/dL (ref 0.0–1.2)
BUN/Creatinine Ratio: 13 (ref 9–23)
BUN: 13 mg/dL (ref 6–24)
CALCIUM: 9.7 mg/dL (ref 8.7–10.2)
CO2: 27 mmol/L (ref 18–29)
Chloride: 98 mmol/L (ref 96–106)
Creatinine, Ser: 0.99 mg/dL (ref 0.57–1.00)
GFR calc non Af Amer: 65 mL/min/{1.73_m2} (ref >59)
GFR, EST AFRICAN AMERICAN: 75 mL/min/{1.73_m2} (ref >59)
GLUCOSE: 142 mg/dL — AB (ref 65–99)
Globulin, Total: 2.7 g/dL (ref 1.5–4.5)
Potassium: 4 mmol/L (ref 3.5–5.2)
Sodium: 141 mmol/L (ref 134–144)
TOTAL PROTEIN: 6.8 g/dL (ref 6.0–8.5)

## 2017-03-20 ENCOUNTER — Telehealth: Payer: Self-pay | Admitting: Family Medicine

## 2017-03-20 ENCOUNTER — Ambulatory Visit (HOSPITAL_COMMUNITY)
Admission: RE | Admit: 2017-03-20 | Discharge: 2017-03-20 | Disposition: A | Discharge status: Home / Self Care | Payer: Medicare HMO | Source: Ambulatory Visit | Attending: Family Medicine | Admitting: Family Medicine

## 2017-03-20 DIAGNOSIS — K824 Cholesterolosis of gallbladder: Secondary | ICD-10-CM | POA: Diagnosis not present

## 2017-03-20 DIAGNOSIS — R1011 Right upper quadrant pain: Secondary | ICD-10-CM | POA: Insufficient documentation

## 2017-03-20 DIAGNOSIS — K760 Fatty (change of) liver, not elsewhere classified: Secondary | ICD-10-CM | POA: Insufficient documentation

## 2017-03-20 DIAGNOSIS — K8020 Calculus of gallbladder without cholecystitis without obstruction: Secondary | ICD-10-CM | POA: Diagnosis not present

## 2017-03-20 LAB — H. PYLORI BREATH TEST: H pylori Breath Test: NEGATIVE

## 2017-03-20 NOTE — Telephone Encounter (Signed)
Addendum to previous phone note: there was a gallbladder polyp seen on RUQ U/S. Unsure of clinical significance related to gallbladder pain. May need work up. Seeing GI in 5 days.

## 2017-03-20 NOTE — Telephone Encounter (Signed)
Called patient to discuss lab results. Negative H. Pylori breath test. RUQ U/S shows no obstruction or cholelithiasis. LFTs normal. She has a GI appt in 5 days. Encouraged her to go to this appt and her PCP will continue to follow along with GI recs. All questions answered. She was appreciative of the call.   Smitty Cords, MD [place], PGY-2

## 2017-03-23 ENCOUNTER — Encounter (HOSPITAL_COMMUNITY): Payer: Self-pay

## 2017-03-23 ENCOUNTER — Encounter: Payer: Self-pay | Admitting: Gynecology

## 2017-03-23 ENCOUNTER — Encounter (HOSPITAL_COMMUNITY)
Admission: RE | Admit: 2017-03-23 | Discharge: 2017-03-23 | Disposition: A | Discharge status: Home / Self Care | Payer: Medicare HMO | Source: Ambulatory Visit | Attending: Gynecology | Admitting: Gynecology

## 2017-03-23 ENCOUNTER — Ambulatory Visit (INDEPENDENT_AMBULATORY_CARE_PROVIDER_SITE_OTHER): Payer: Medicare HMO | Admitting: Gynecology

## 2017-03-23 VITALS — BP 132/88 | Ht 63.25 in | Wt 296.2 lb

## 2017-03-23 DIAGNOSIS — N950 Postmenopausal bleeding: Secondary | ICD-10-CM | POA: Diagnosis not present

## 2017-03-23 DIAGNOSIS — Z01812 Encounter for preprocedural laboratory examination: Secondary | ICD-10-CM | POA: Insufficient documentation

## 2017-03-23 DIAGNOSIS — N840 Polyp of corpus uteri: Secondary | ICD-10-CM

## 2017-03-23 DIAGNOSIS — Z01818 Encounter for other preprocedural examination: Secondary | ICD-10-CM

## 2017-03-23 HISTORY — DX: Other specified soft tissue disorders: M79.89

## 2017-03-23 HISTORY — DX: Fatty (change of) liver, not elsewhere classified: K76.0

## 2017-03-23 LAB — BASIC METABOLIC PANEL
ANION GAP: 10 (ref 5–15)
BUN: 12 mg/dL (ref 6–20)
CALCIUM: 9.4 mg/dL (ref 8.9–10.3)
CO2: 30 mmol/L (ref 22–32)
Chloride: 99 mmol/L — ABNORMAL LOW (ref 101–111)
Creatinine, Ser: 1.1 mg/dL — ABNORMAL HIGH (ref 0.44–1.00)
GFR, EST NON AFRICAN AMERICAN: 56 mL/min — AB (ref >60)
Glucose, Bld: 99 mg/dL (ref 65–99)
Potassium: 3.2 mmol/L — ABNORMAL LOW (ref 3.5–5.1)
Sodium: 139 mmol/L (ref 135–145)

## 2017-03-23 LAB — CBC
HCT: 45.8 % (ref 36.0–46.0)
HEMOGLOBIN: 15.7 g/dL — AB (ref 12.0–15.0)
MCH: 29.3 pg (ref 26.0–34.0)
MCHC: 34.3 g/dL (ref 30.0–36.0)
MCV: 85.6 fL (ref 78.0–100.0)
Platelets: 217 10*3/uL (ref 150–400)
RBC: 5.35 MIL/uL — AB (ref 3.87–5.11)
RDW: 14.6 % (ref 11.5–15.5)
WBC: 11.7 10*3/uL — ABNORMAL HIGH (ref 4.0–10.5)

## 2017-03-23 NOTE — Patient Instructions (Signed)
Hysteroscopy Hysteroscopy is a procedure used for looking inside the womb (uterus). It may be done for various reasons, including:  To evaluate abnormal bleeding, fibroid (benign, noncancerous) tumors, polyps, scar tissue (adhesions), and possibly cancer of the uterus.  To look for lumps (tumors) and other uterine growths.  To look for causes of why a woman cannot get pregnant (infertility), causes of recurrent loss of pregnancy (miscarriages), or a lost intrauterine device (IUD).  To perform a sterilization by blocking the fallopian tubes from inside the uterus.  In this procedure, a thin, flexible tube with a tiny light and camera on the end of it (hysteroscope) is used to look inside the uterus. A hysteroscopy should be done right after a menstrual period to be sure you are not pregnant. LET [facility] CARE PROVIDER KNOW ABOUT:  Any allergies you have.  All medicines you are taking, including vitamins, herbs, eye drops, creams, and over-the-counter medicines.  Previous problems you or members of your family have had with the use of anesthetics.  Any blood disorders you have.  Previous surgeries you have had.  Medical conditions you have. RISKS AND COMPLICATIONS Generally, this is a safe procedure. However, as with any procedure, complications can occur. Possible complications include:  Putting a hole in the uterus.  Excessive bleeding.  Infection.  Damage to the cervix.  Injury to other organs.  Allergic reaction to medicines.  Too much fluid used in the uterus for the procedure.  BEFORE THE PROCEDURE  Ask your health care provider about changing or stopping any regular medicines.  Do not take aspirin or blood thinners for 1 week before the procedure, or as directed by your health care provider. These can cause bleeding.  If you smoke, do not smoke for 2 weeks before the procedure.  In some cases, a medicine is placed in the cervix the day before the procedure.  This medicine makes the cervix have a larger opening (dilate). This makes it easier for the instrument to be inserted into the uterus during the procedure.  Do not eat or drink anything for at least 8 hours before the surgery.  Arrange for someone to take you home after the procedure. PROCEDURE  You may be given a medicine to relax you (sedative). You may also be given one of the following: ? A medicine that numbs the area around the cervix (local anesthetic). ? A medicine that makes you sleep through the procedure (general anesthetic).  The hysteroscope is inserted through the vagina into the uterus. The camera on the hysteroscope sends a picture to a TV screen. This gives the surgeon a good view inside the uterus.  During the procedure, air or a liquid is put into the uterus, which allows the surgeon to see better.  Sometimes, tissue is gently scraped from inside the uterus. These tissue samples are sent to a lab for testing. What to expect after the procedure  If you had a general anesthetic, you may be groggy for a couple hours after the procedure.  If you had a local anesthetic, you will be able to go home as soon as you are stable and feel ready.  You may have some cramping. This normally lasts for a couple days.  You may have bleeding, which varies from light spotting for a few days to menstrual-like bleeding for 3-7 days. This is normal.  If your test results are not back during the visit, make an appointment with your health care provider to find out the  results. This information is not intended to replace advice given to you by your health care provider. Make sure you discuss any questions you have with your health care provider. Document Released: 01/12/2001 Document Revised: 03/13/2016 Document Reviewed: 05/05/2013 Elsevier Interactive Patient Education  2017 [employer].

## 2017-03-23 NOTE — Patient Instructions (Addendum)
Your procedure is scheduled on:  Friday, April 03, 2017  Enter through the Main Entrance of [facility] at:  6:00 AM  Pick up the phone at the desk and dial [phone].  Call this number if you have problems the morning of surgery: [phone].  Remember: Do NOT eat food or drink after:  Midnight Thursday  Take these medicines the morning of surgery with a SIP OF WATER:  Atorvastatin, Lisinopril, Pindolol, Potassium, Torsemide  Use Asthma Inhalers per normal routine  Bring rescue asthma inhaler day of surgery  Do NOT take evening dose of Metformin the evening before surgery  Bring CPAP machine day of surgery  Stop ALL herbal medications at this time  Do NOT smoke the day of surgery.  Do NOT wear jewelry (body piercing), metal hair clips/bobby pins, make-up, or nail polish. Do NOT wear lotions, powders, or perfumes.  You may wear deodorant. Do NOT shave for 48 hours prior to surgery. Do NOT bring valuables to the hospital. Contacts, dentures, or bridgework may not be worn into surgery.  Have a responsible adult drive you home and stay with you for 24 hours after your procedure  Bring a copy of your healthcare power of attorney and living will documents.

## 2017-03-23 NOTE — Progress Notes (Signed)
Hailey Barnes is an 56 y.o. female who presented to the office today for preoperative examination.She was seen in the office on 02/17/2017. We will going to do an endometrial biopsy which had a vaginal discharge and she had bacterial vaginosis and was placed on Flagyl 500 mg twice a day for 7 days. Patient had a normal Pap smear less than 12 months ago. She had a complete STD to include HIV, RPR, hepatitis B and C recently which was normal. She was a cocaine addict 3 years ago. At time of her evaluation last year before her surgery she was noted on ultrasound had bilateral hydrosalpinx.  Review of her record indicated that in July 2017 had a resectoscopic myomectomy for dysfunction uterine bleeding and pathology report demonstrated the following:  Diagnosis Endometrium, curettage, with polyp SCANT INACTIVE ENDOMETRIUM FRAGMENTS OF SUBMUCOSA LEIOMYOMA  Patient had an ultrasound and sonohysterogram on 02/26/2017 used to history the following: Uterus measures 7.8 x 5.8 x 5.1 cm with endometrial stripe of 9.3 mm. Right ovary continued presence of a serpentine tubular cystic mass measuring 40 x 17 x 32 mm unchanged from previous scan the previous year. Thick walled follicle measuring 18 x 13 mm with negative color flow was noted left ovarian follicle 24 x 19 mm. Previous hydrosalpinx on the left not seen. No fluid in the cul-de-sac. The cervix was cleansed with Betadine solution and a sterile catheter was introduced into the uterine cavity and 11 x 5 mm and a 13 x 6 mm intrauterine defect was noted.  The assessment and plan at that office visit was as follows: "Patient with slight dysfunction bleeding attributed to endometrial polyp. Previous left hydrosalpinx longer seen persistent right hydrosalpinx unchanged in size in over year. Review of patient's record indicated that last year she had resectoscopic myomectomy. Because patient felt uncomfortable with doing an endometrial biopsy will be left for  the resectoscopic polypectomy and endometrial biopsy at time of her surgery.  Pertinent Gynecological History: Menses: Intermenstrual bleeding Bleeding: intermenstrual bleeding Contraception: none DES exposure: unknown Blood transfusions: none Sexually transmitted diseases: Gonorrhea, chlamydia, Trichomonas Previous GYN Procedures: D&C 2 also myomectomy and cesarean section  Last mammogram: normal Date: 2012 Last pap: normal Date: 2015 OB History: G 1, P 1     Menstrual History: Menarche age: 75 Patient's last menstrual period was 08/31/2015.    Past Medical History:  Diagnosis Date  . Arthritis    "knees" (02/21/2014), back  . Chest pressure 10/22/2016  . CHF (congestive heart failure) ([place])   . Chronic bronchitis ([place])    "get it q yr" (02/21/2014)  . Chronic diastolic CHF (congestive heart failure) ([place])    a. Echo 05/2014: EF 65-70% with Grade 1 DD.  Marland Kitchen Chronic lower back pain   . Complication of anesthesia    "I don't come out well; I chew on my tongue"  . COPD (chronic obstructive pulmonary disease) ([place])   . Diabetes mellitus without complication ([place])   . Fibroid   . GERD (gastroesophageal reflux disease)   . Heart murmur   . Hypertension   . Infection of right eye    current dx on Saturday 7/15 - using drops  . Migraine 1982-2009  . MVP (mitral valve prolapse)   . Neuromuscular disorder ([place])    right leg nerve pain  . Shortness of breath   . Sinus pause    a. noted on telemetry during admission from 10/2016, lasting up to 4.4 seconds. BB discontinued.   . Sleep  apnea 02/2016   recent hospitalization noted apnea during stay no sleep studt performed since this episode  . Stroke [facility]) noted on CAT 02/2014   "light", LLE weakness remains (03/30/2014)  . 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 Tingling in extremities 12/12/2010   Uric acid and electrolytes (02/23) normal but WBC elevated.   D/Dx: carpal tunnel,  ulnar neuropathy Less likely: cervical radiculopathy, vasculitis     Past Surgical History:  Procedure Laterality Date  . [place]  . DILATATION & CURETTAGE/HYSTEROSCOPY WITH MYOSURE N/A 05/06/2016   Procedure: DILATATION & CURETTAGE/HYSTEROSCOPY WITH MYOSURE;  Surgeon: Terrance Mass, MD;  Location: [facility];  Service: Gynecology;  Laterality: N/A;  request to follow around 8:45  requests one hour OR time  . DILATION AND CURETTAGE OF UTERUS    . FOOT SURGERY Bilateral    "took bones out; put pins in"  . LEFT AND RIGHT HEART CATHETERIZATION WITH CORONARY ANGIOGRAM N/A 03/31/2014   Procedure: LEFT AND RIGHT HEART CATHETERIZATION WITH CORONARY ANGIOGRAM;  Surgeon: Birdie Riddle, MD;  Location: [facility];  Service: Cardiovascular;  Laterality: N/A;  . MYOMECTOMY    . sonohystogram  04/14/2016   [facility]: intramural fibroid, premenopausal endometrium, right fluid filled tubular structure    Family History  Problem Relation Age of Onset  . Hypertension Mother   . COPD Mother   . Hypertension Father   . Cancer Sister        UTERINE???  . COPD Sister     Social History:  reports that she has been smoking Cigarettes.  She has a 18.50 pack-year smoking history. She has never used smokeless tobacco. She reports that she does not drink alcohol or use drugs.  Allergies: No Known Allergies   (Not in a hospital admission)  REVIEW OF SYSTEMS: A ROS was performed and pertinent positives and negatives are included in the history.  GENERAL: No fevers or chills. HEENT: No change in vision, no earache, sore throat or sinus congestion. NECK: No pain or stiffness. CARDIOVASCULAR: No chest pain or pressure. No palpitations. PULMONARY: No shortness of breath, cough or wheeze. GASTROINTESTINAL: No abdominal pain, nausea, vomiting or diarrhea, melena or bright red blood per rectum. GENITOURINARY: No urinary frequency, urgency, hesitancy or dysuria. MUSCULOSKELETAL:  No joint or muscle pain, no back pain, no recent trauma. DERMATOLOGIC: No rash, no itching, no lesions. ENDOCRINE: No polyuria, polydipsia, no heat or cold intolerance. No recent change in weight. HEMATOLOGICAL: No anemia or easy bruising or bleeding. NEUROLOGIC: No headache, seizures, numbness, tingling or weakness. PSYCHIATRIC: No depression, no loss of interest in normal activity or change in sleep pattern.     Blood pressure 132/88, height 5' 3.25" (1.607 m), weight 296 lb 3.2 oz (134.4 kg), last menstrual period 10/15/2016.  Physical Exam: HEENT:unremarkable Neck:Supple, midline, no thyroid megaly, no carotid bruits Lungs:  Clear to auscultation no rhonchi's or wheezes Heart:Regular rate and rhythm, no murmurs or gallops Breast Exam: Symmetrical in appearance no palpable mass or tenderness no supraclavicular axillary lymphadenopathy Abdomen: Soft nontender no rebound or guarding Pelvic:BUS within normal limits Vagina: No lesions or discharge Cervix: No lesions or discharge Uterus: Difficult to assess due to patient's abdominal girth and pendulous abdomen Adnexa: Same as above Extremities: No cords, no edema Rectal: Not done    Assessment/Plan: Patient with postmenopausal bleeding sonohysterogram indicating recurrence of endometrial polyp. Endometrial biopsy not able to be performed  recently due to patient's discomfort. Patient will be scheduled for outpatient resectoscopic polypectomy. Last year she had resectoscopic myomectomy. The risks benefits and pros and cons of surgery discussed with the patient as follows:                        Patient was counseled as to the risk of surgery to include the following:  1. Infection (prohylactic antibiotics will be administered)  2. DVT/Pulmonary Embolism (prophylactic pneumo compression stockings will be used)  3.Trauma to internal organs requiring additional surgical procedure to repair any injury to     Internal organs requiring  perhaps additional hospitalization days.  4.Hemmorhage requiring transfusion and blood products which carry risks such as anaphylactic reaction, hepatitis and AIDS  Patient had received literature information on the procedure scheduled and all her questions were answered and fully accepts all risk.  Cardiac evaluation by Dr. Candee Furbish: "Preoperative cardiac risk stratification  - Planned anesthesia for polyp removal.. Abnormal uterine bleeding.  - Has been able to complete greater than 4 METS of activity. She does have chronic shortness of breath with activity  - Does not have any signs of acute heart failure currently.  - She may proceed with surgery with no further cardiac testing. She is of moderate risk with risks mainly based upon her obesity.  Chronic diastolic heart failure  - Continue with torsemide 60 mg Once a day  - Fluid restrict 1-1-1/2 L  - Prior creatinine ranges from 1.32-1.13"  [facility] HMD9:31 AMTD     Uvaldo Rising H 03/23/2017, 9:25 AM

## 2017-03-23 NOTE — Pre-Procedure Instructions (Signed)
Dr. Wilfrid Lund reviewed medical history, no new orders received at this time.

## 2017-03-24 ENCOUNTER — Encounter: Payer: Self-pay | Admitting: Gastroenterology

## 2017-03-24 ENCOUNTER — Ambulatory Visit (INDEPENDENT_AMBULATORY_CARE_PROVIDER_SITE_OTHER): Payer: Medicare HMO | Admitting: Gastroenterology

## 2017-03-24 VITALS — BP 150/96 | HR 100 | Ht 62.5 in | Wt 297.2 lb

## 2017-03-24 DIAGNOSIS — Z1211 Encounter for screening for malignant neoplasm of colon: Secondary | ICD-10-CM | POA: Diagnosis not present

## 2017-03-24 DIAGNOSIS — K824 Cholesterolosis of gallbladder: Secondary | ICD-10-CM

## 2017-03-24 DIAGNOSIS — K760 Fatty (change of) liver, not elsewhere classified: Secondary | ICD-10-CM

## 2017-03-24 DIAGNOSIS — R1011 Right upper quadrant pain: Secondary | ICD-10-CM | POA: Diagnosis not present

## 2017-03-24 MED ORDER — NA SULFATE-K SULFATE-MG SULF 17.5-3.13-1.6 GM/177ML PO SOLN: 1.0000 | Freq: Once | ORAL | 0 refills | Status: AC

## 2017-03-24 MED ORDER — OMEPRAZOLE 40 MG PO CPDR: 40.0000 mg | DELAYED_RELEASE_CAPSULE | Freq: Two times a day (BID) | ORAL | 3 refills | Status: DC

## 2017-03-24 NOTE — Progress Notes (Addendum)
HPI :  56 y/o female with a history of obesity (BMI > 50), CVA, OSA, CHF, COPD, DM, here for a new patient evaluation for abdominal pain.   Pain located in the RUQ which can rediate to the R flank. She thinks pain going on for a year or so, it can come and go.  She report she was diagnosed with H pylori last year and treated for it, and she thinks it helped her symptoms for a period of time, and then symptoms came back.   She will feel the pain every day for the most part. The pain is present constantly after she eats something. Symptoms are reliably worsened by eating or drinking anything. Within a half hour symptoms are worse. Stays bad for a few hours, "rated 10/10" when she has it. Rates it at 5/10 at baseline. Some nausea, but no vomiting. She thinks the pain is lessened when she lies down, but worsened with sitting. She denies any NSAIDs. She denies any heartburn or reflux. She denies any antacids. She denies any trial of antacids.   She is noted to have fatty liver on imaging. LFTs normal. She denies any alcohol use. She thinks she has gained weight over the past year. No FH of liver disease.  She endorses a remote history of an EGD in her early 9s. She has never had a prior colonoscopy.  Last echo was in 10/2016 - EF 75%, LV wallthickening, diastolic dysfunction  She smokes tobacco.  She has no FH of colon cancer. No blood in the stools. She reports having some loose stools for the past 2 weeks or so.  She is having about 3 BMs per day, normally usually about 1 BM per day.   Prior workup: RUQ [place] 03/20/17 - 73m GB polyp, no stones, normal CBD, fatty liver H pylori breath test negative 03/18/17 HIDA 12/04/16 - normal [place] 12/02/16 - no gallstones or polyps noted, fatty liver CT abdomen / pelvis 06/01/15 - ? Avascular necrosis of femoral head, adrenal adenoma, fatty liver   Past Medical History:  Diagnosis Date  . Arthritis    "knees" (02/21/2014), back  . Chest pressure  10/22/2016  . CH[place] ([place]estive heart failure) ([facility]   . Chronic bronchitis ([facility]    "get it q yr" (02/21/2014)  . Chronic diastolic CHF (congestive heart failure) ([facility]    a. Echo 05/2014: EF 65-70% with Grade 1 DD.  .Marland KitchenChronic lower back pain   . Complication of anesthesia    "I don't come out well; I chew on my tongue"  . COPD (chronic obstructive pulmonary disease) ([facility]   . Diabetes mellitus without complication ([facility]   . Fatty liver   . Fibroid   . GERD (gastroesophageal reflux disease)    history of  . Heart murmur   . Hypertension   . Infection of right eye    current dx on Saturday 7/15 - using drops  . Leg swelling    bilateral  . Migraine 1982-2009  . MVP (mitral valve prolapse)   . Neuromuscular disorder ([facility]    right leg nerve pain  . Shortness of breath   . Sinus pause    a. noted on telemetry during admission from 10/2016, lasting up to 4.4 seconds. BB discontinued.   . Sleep apnea 02/2016   recent hospitalization noted apnea during stay no sleep studt performed since this episode  . Stroke ([facility] noted on CAT 02/2014   "light", LLE weakness remains (03/30/2014)  .  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 Tingling in extremities 12/12/2010   Uric acid and electrolytes (02/23) normal but WBC elevated.   D/Dx: carpal tunnel, ulnar neuropathy Less likely: cervical radiculopathy, vasculitis      Past Surgical History:  Procedure Laterality Date  . [place]  . DILATATION & CURETTAGE/HYSTEROSCOPY WITH MYOSURE N/A 05/06/2016   Procedure: DILATATION & CURETTAGE/HYSTEROSCOPY WITH MYOSURE;  Surgeon: Terrance Mass, MD;  Location: [facility];  Service: Gynecology;  Laterality: N/A;  request to follow around 8:45  requests one hour OR time  . DILATION AND CURETTAGE OF UTERUS    . FOOT SURGERY Bilateral    "took bones out; put pins in"  . LEFT AND RIGHT HEART CATHETERIZATION WITH CORONARY ANGIOGRAM N/A 03/31/2014    Procedure: LEFT AND RIGHT HEART CATHETERIZATION WITH CORONARY ANGIOGRAM;  Surgeon: Birdie Riddle, MD;  Location: [facility];  Service: Cardiovascular;  Laterality: N/A;  . MYOMECTOMY    . sonohystogram  04/14/2016   [facility]: intramural fibroid, premenopausal endometrium, right fluid filled tubular structure   Family History  Problem Relation Age of Onset  . Hypertension Mother   . COPD Mother   . Hypertension Father   . Cancer Sister        UTERINE???  . COPD Sister    Social History  Substance Use Topics  . Smoking status: Current Every Day Smoker    Packs/day: 0.50    Years: 40.00    Types: Cigarettes  . Smokeless tobacco: Never Used     Comment: Would like to quit  . Alcohol use No   Current Outpatient Prescriptions  Medication Sig Dispense Refill  . acetaminophen (TYLENOL) 500 MG tablet Take 1 tablet (500 mg total) by mouth every 6 (six) hours as needed. 30 tablet 0  . albuterol (PROVENTIL HFA;VENTOLIN HFA) 108 (90 Base) MCG/ACT inhaler Inhale 2 puffs into the lungs every 6 (six) hours as needed for wheezing or shortness of breath.     Marland Kitchen albuterol (PROVENTIL) (2.5 MG/3ML) 0.083% nebulizer solution Take 3 mLs (2.5 mg total) by nebulization every 6 (six) hours as needed for wheezing or shortness of breath. 150 mL 1  . aspirin 81 MG tablet Take 81 mg by mouth daily.    Marland Kitchen atorvastatin (LIPITOR) 40 MG tablet TAKE ONE TABLET BY MOUTH ONCE DAILY 90 tablet 3  . Blood Glucose Monitoring Suppl (ACCU-CHEK AVIVA PLUS) w/Device KIT Use daily as needed to check blood sugar. 1 kit 0  . glucose blood (ACCU-CHEK AVIVA) test strip Use daily as needed to check blood sugar. 100 each 12  . hydrocortisone 1 % ointment Apply 1 application topically 2 (two) times daily as needed (eczema on face). 30 g 1  . hydrOXYzine (ATARAX/VISTARIL) 50 MG tablet Take 1 tablet (50 mg total) by mouth at bedtime. (Patient taking differently: Take 50 mg by mouth at bedtime as needed (for  sleep). ) 30 tablet 0  . Lancet Devices (ACCU-CHEK SOFTCLIX) lancets Use daily as needed to check blood sugar. 1 each 11  . lisinopril (PRINIVIL,ZESTRIL) 20 MG tablet Take 1 tablet (20 mg total) by mouth daily. 90 tablet 3  . magnesium chloride (SLOW-MAG) 64 MG TBEC SR tablet Take 2 tablets (128 mg total) by mouth daily. (Patient taking differently: Take 2 tablets by mouth daily as needed (leg cramp "charlie horse"). ) 60 tablet 0  . metFORMIN (GLUCOPHAGE) 1000 MG tablet  Take 1 tablet (1,000 mg total) by mouth 2 (two) times daily with a meal. 180 tablet 3  . mometasone-formoterol (DULERA) 200-5 MCG/ACT AERO Inhale 2 puffs into the lungs 2 (two) times daily.    . pindolol (VISKEN) 5 MG tablet Take 1 tablet (5 mg total) by mouth 2 (two) times daily. 180 tablet 3  . Potassium Chloride ER 20 MEQ TBCR Take 20 mEq by mouth daily. 30 tablet 0  . Spacer/Aero-Holding Chambers (E-Z SPACER) inhaler Use as instructed 1 each 2  . tiotropium (SPIRIVA) 18 MCG inhalation capsule Place 1 capsule (18 mcg total) into inhaler and inhale daily. 30 capsule 1  . torsemide (DEMADEX) 20 MG tablet Take 3 tablets (60 mg total) by mouth daily. Take 3 tablets by mouth daily (Patient taking differently: Take 60 mg by mouth daily. ) 90 tablet 11  . triamcinolone cream (KENALOG) 0.1 % Apply 1 application topically 3 (three) times daily as needed (eczema on arms). 30 g 1   No current facility-administered medications for this visit.    No Known Allergies   Review of Systems: All systems reviewed and negative except where noted in HPI.   Lab Results  Component Value Date   WBC 11.7 (H) 03/23/2017   HGB 15.7 (H) 03/23/2017   HCT 45.8 03/23/2017   MCV 85.6 03/23/2017   PLT 217 03/23/2017    Lab Results  Component Value Date   CREATININE 1.10 (H) 03/23/2017   BUN 12 03/23/2017   NA 139 03/23/2017   K 3.2 (L) 03/23/2017   CL 99 (L) 03/23/2017   CO2 30 03/23/2017    Lab Results  Component Value Date   ALT 28  03/18/2017   AST 23 03/18/2017   ALKPHOS 104 03/18/2017   BILITOT 0.3 03/18/2017    Lab Results  Component Value Date   LIPASE 32 12/02/2016     Physical Exam: Ht 5' 2.5" (1.588 m) Comment: height measured without shoes  Wt 297 lb 4 oz (134.8 kg)   LMP 10/15/2016 (Approximate)   BMI 53.50 kg/m  Constitutional: Pleasant,well-developed, female in no acute distress. HEENT: Normocephalic and atraumatic. Conjunctivae are normal. No scleral icterus. Neck supple.  Cardiovascular: Normal rate, regular rhythm.  Pulmonary/chest: Effort normal and breath sounds normal. No wheezing, rales or rhonchi. Abdominal: Soft, protuberant / obese, mild RUQ TTP. There are no masses palpable. No hepatomegaly. Extremities: no edema Lymphadenopathy: No cervical adenopathy noted. Neurological: Alert and oriented to person place and time. Skin: Skin is warm and dry. No rashes noted. Psychiatric: Normal mood and affect. Behavior is normal.   ASSESSMENT AND PLAN: 56 year old female here for new patient evaluation to discuss the following issues:  Right upper quadrant pain - CT scan without a clear etiology, HIDA scan negative, prior ultrasounds negative however most recent ultrasound shows an 8 mm gallbladder polyp. History of H. pylori infection however recent breath test was negative. While biliary colic is possible, perhaps related to GB polyp, her symptoms are a bit atypical for this issue as she has some chronic component to her pain as well. However she has reliable worsening of symptoms with eating. Peptic ulcer disease also is possible, recommend empiric therapy with omeprazole 40 mg twice daily for 2 weeks to see if this helps. I otherwise discussed having her see a surgeon to discuss cholecystectomy given the gallbladder polyp and her symptoms, she wants to await her trial of PPI first which is reasonable. Have tentatively have her scheduled for an upper endoscopy following  the risks and benefits of  this, to be done at [facility] due to her BMI. If her symptoms are not improved with PPI before this is done, we may still have her see a surgeon in the interim which she was agreeable to. I asked her to call me in 2 weeks to let me know how she is doing.  Gallbladder polyp - as above, she may warrant cholecystectomy, but if symptoms resolve with PPI and she does not have a surgical evaluation, will need a follow up US in 6 months to assess for interval changes. She agreed.  Colon cancer screening - overdue for first time screening, we discussed options for screening to include optical colonoscopy versus stool based testing. Following this discussion she wished to have an optical colonoscopy, this will be done at [facility] given her weight. For her recently stools she can use Imodium as needed.  Fatty liver - she denies alcohol use, this is almost certainly related to her obesity. We discussed risks for [name] and cirrhosis associated with fatty liver disease. Her LFTs are presently normal, recommend checking them yearly. Otherwise recommend diet and exercise for goal weight loss which she states she will work on. Recommend routine coffee consumption which can help prevent fibrotic changes, she was agreeable to this.  Morbid obesity - as above, she is agreeable to diet / exercise for weight loss and states she is working on this.   Richlawn Cellar, MD [facility] Pager [phone]  CC: Vivi Barrack, MD

## 2017-03-24 NOTE — Patient Instructions (Signed)
If you are age 54 or older, your body mass index should be between 23-30. Your Body mass index is 53.5 kg/m. If this is out of the aforementioned range listed, please consider follow up with your Primary Care Provider.  If you are age 44 or younger, your body mass index should be between 19-25. Your Body mass index is 53.5 kg/m. If this is out of the aformentioned range listed, please consider follow up with your Primary Care Provider.   We have sent the following medications to your pharmacy for you to pick up at your convenience:  Suprep  Omeprazole  You will be contacted for a repeat ultrasound of your gallbladder which is due in 6 months.  You have been scheduled for an endoscopy. Please follow written instructions given to you at your visit today. If you use inhalers (even only as needed), please bring them with you on the day of your procedure. Your physician has requested that you go to www.startemmi.com and enter the access code given to you at your visit today. This web site gives a general overview about your procedure. However, you should still follow specific instructions given to you by our office regarding your preparation for the procedure.  Thank you.

## 2017-03-27 DIAGNOSIS — R269 Unspecified abnormalities of gait and mobility: Secondary | ICD-10-CM | POA: Diagnosis not present

## 2017-03-27 DIAGNOSIS — I5033 Acute on chronic diastolic (congestive) heart failure: Secondary | ICD-10-CM | POA: Diagnosis not present

## 2017-03-31 ENCOUNTER — Other Ambulatory Visit: Payer: Self-pay

## 2017-03-31 DIAGNOSIS — R1011 Right upper quadrant pain: Secondary | ICD-10-CM

## 2017-03-31 DIAGNOSIS — K824 Cholesterolosis of gallbladder: Secondary | ICD-10-CM

## 2017-03-31 DIAGNOSIS — Z1211 Encounter for screening for malignant neoplasm of colon: Secondary | ICD-10-CM

## 2017-04-01 ENCOUNTER — Other Ambulatory Visit: Payer: Self-pay

## 2017-04-01 ENCOUNTER — Telehealth: Payer: Self-pay | Admitting: *Deleted

## 2017-04-01 NOTE — Telephone Encounter (Signed)
Patient left a message asking Dr. [name] so place and order for an epiddural injection. Just looking back at her note an order was placed for an epidural injection in April. Does she need a f/u appointment with you?

## 2017-04-02 ENCOUNTER — Telehealth: Payer: Self-pay | Admitting: Family Medicine

## 2017-04-02 DIAGNOSIS — M5416 Radiculopathy, lumbar region: Secondary | ICD-10-CM

## 2017-04-02 NOTE — Telephone Encounter (Signed)
Patient called states she needs another back injection. Thanks

## 2017-04-03 ENCOUNTER — Encounter (HOSPITAL_COMMUNITY): Payer: Self-pay

## 2017-04-03 ENCOUNTER — Ambulatory Visit (HOSPITAL_COMMUNITY): Payer: Medicare HMO | Admitting: Anesthesiology

## 2017-04-03 ENCOUNTER — Encounter (HOSPITAL_COMMUNITY)
Admission: RE | Disposition: A | Discharge status: Home / Self Care | Payer: Self-pay | Source: Ambulatory Visit | Attending: Gynecology

## 2017-04-03 ENCOUNTER — Ambulatory Visit (HOSPITAL_COMMUNITY)
Admission: RE | Admit: 2017-04-03 | Discharge: 2017-04-03 | Disposition: A | Discharge status: Home / Self Care | Payer: Medicare HMO | Source: Ambulatory Visit | Attending: Gynecology | Admitting: Gynecology

## 2017-04-03 DIAGNOSIS — I10 Essential (primary) hypertension: Secondary | ICD-10-CM | POA: Diagnosis not present

## 2017-04-03 DIAGNOSIS — K219 Gastro-esophageal reflux disease without esophagitis: Secondary | ICD-10-CM | POA: Diagnosis not present

## 2017-04-03 DIAGNOSIS — Z7984 Long term (current) use of oral hypoglycemic drugs: Secondary | ICD-10-CM | POA: Diagnosis not present

## 2017-04-03 DIAGNOSIS — Z79899 Other long term (current) drug therapy: Secondary | ICD-10-CM | POA: Diagnosis not present

## 2017-04-03 DIAGNOSIS — G4730 Sleep apnea, unspecified: Secondary | ICD-10-CM | POA: Insufficient documentation

## 2017-04-03 DIAGNOSIS — I110 Hypertensive heart disease with heart failure: Secondary | ICD-10-CM | POA: Insufficient documentation

## 2017-04-03 DIAGNOSIS — I5032 Chronic diastolic (congestive) heart failure: Secondary | ICD-10-CM | POA: Insufficient documentation

## 2017-04-03 DIAGNOSIS — Z8673 Personal history of transient ischemic attack (TIA), and cerebral infarction without residual deficits: Secondary | ICD-10-CM | POA: Diagnosis not present

## 2017-04-03 DIAGNOSIS — D250 Submucous leiomyoma of uterus: Secondary | ICD-10-CM | POA: Insufficient documentation

## 2017-04-03 DIAGNOSIS — N950 Postmenopausal bleeding: Secondary | ICD-10-CM | POA: Insufficient documentation

## 2017-04-03 DIAGNOSIS — F17210 Nicotine dependence, cigarettes, uncomplicated: Secondary | ICD-10-CM | POA: Diagnosis not present

## 2017-04-03 DIAGNOSIS — E119 Type 2 diabetes mellitus without complications: Secondary | ICD-10-CM | POA: Diagnosis not present

## 2017-04-03 DIAGNOSIS — N840 Polyp of corpus uteri: Secondary | ICD-10-CM | POA: Insufficient documentation

## 2017-04-03 HISTORY — PX: DILATATION & CURETTAGE/HYSTEROSCOPY WITH MYOSURE: SHX6511

## 2017-04-03 LAB — GLUCOSE, CAPILLARY
GLUCOSE-CAPILLARY: 169 mg/dL — AB (ref 65–99)
Glucose-Capillary: 173 mg/dL — ABNORMAL HIGH (ref 65–99)

## 2017-04-03 SURGERY — DILATATION & CURETTAGE/HYSTEROSCOPY WITH MYOSURE
Anesthesia: General | Site: Vagina

## 2017-04-03 MED ORDER — CEFOTETAN DISODIUM-DEXTROSE 2-2.08 GM-% IV SOLR
2.0000 g | INTRAVENOUS | Status: AC
  Administered 2017-04-03: 2 g via INTRAVENOUS

## 2017-04-03 MED ORDER — LACTATED RINGERS IV SOLN
INTRAVENOUS | Status: DC
  Administered 2017-04-03: 125 mL/h via INTRAVENOUS

## 2017-04-03 MED ORDER — DEXMEDETOMIDINE HCL IN NACL 200 MCG/50ML IV SOLN
INTRAVENOUS | Status: AC
  Filled 2017-04-03: qty 50

## 2017-04-03 MED ORDER — PROMETHAZINE HCL 25 MG/ML IJ SOLN: 6.2500 mg | INTRAMUSCULAR | Status: DC | PRN

## 2017-04-03 MED ORDER — SODIUM CHLORIDE 0.9 % IJ SOLN
INTRAMUSCULAR | Status: AC
  Filled 2017-04-03: qty 10

## 2017-04-03 MED ORDER — LIDOCAINE HCL (CARDIAC) 20 MG/ML IV SOLN
INTRAVENOUS | Status: AC
  Filled 2017-04-03: qty 5

## 2017-04-03 MED ORDER — SCOPOLAMINE 1 MG/3DAYS TD PT72
MEDICATED_PATCH | TRANSDERMAL | Status: AC
  Administered 2017-04-03: 1.5 mg via TRANSDERMAL
  Filled 2017-04-03: qty 1

## 2017-04-03 MED ORDER — DEXAMETHASONE SODIUM PHOSPHATE 4 MG/ML IJ SOLN
INTRAMUSCULAR | Status: AC
  Filled 2017-04-03: qty 1

## 2017-04-03 MED ORDER — ONDANSETRON HCL 4 MG/2ML IJ SOLN
INTRAMUSCULAR | Status: DC | PRN
  Administered 2017-04-03: 4 mg via INTRAVENOUS

## 2017-04-03 MED ORDER — LIDOCAINE HCL (CARDIAC) 20 MG/ML IV SOLN
INTRAVENOUS | Status: DC | PRN
  Administered 2017-04-03: 80 mg via INTRAVENOUS

## 2017-04-03 MED ORDER — KETOROLAC TROMETHAMINE 30 MG/ML IJ SOLN
INTRAMUSCULAR | Status: AC
  Filled 2017-04-03: qty 1

## 2017-04-03 MED ORDER — ACETAMINOPHEN 500 MG PO TABS
1000.0000 mg | ORAL_TABLET | Freq: Once | ORAL | Status: AC
  Administered 2017-04-03: 1000 mg via ORAL

## 2017-04-03 MED ORDER — SCOPOLAMINE 1 MG/3DAYS TD PT72: 1.0000 | MEDICATED_PATCH | Freq: Once | TRANSDERMAL | 12 refills | Status: AC

## 2017-04-03 MED ORDER — SCOPOLAMINE 1 MG/3DAYS TD PT72
1.0000 | MEDICATED_PATCH | Freq: Once | TRANSDERMAL | Status: DC
  Administered 2017-04-03: 1.5 mg via TRANSDERMAL

## 2017-04-03 MED ORDER — DEXMEDETOMIDINE HCL 200 MCG/2ML IV SOLN
INTRAVENOUS | Status: DC | PRN
  Administered 2017-04-03 (×2): 10 ug via INTRAVENOUS

## 2017-04-03 MED ORDER — PROPOFOL 10 MG/ML IV BOLUS
INTRAVENOUS | Status: AC
  Filled 2017-04-03: qty 20

## 2017-04-03 MED ORDER — ONDANSETRON HCL 4 MG/2ML IJ SOLN
INTRAMUSCULAR | Status: AC
  Filled 2017-04-03: qty 2

## 2017-04-03 MED ORDER — ACETAMINOPHEN 500 MG PO TABS
ORAL_TABLET | ORAL | Status: AC
  Filled 2017-04-03: qty 2

## 2017-04-03 MED ORDER — MIDAZOLAM HCL 2 MG/2ML IJ SOLN
INTRAMUSCULAR | Status: AC
  Filled 2017-04-03: qty 2

## 2017-04-03 MED ORDER — FENTANYL CITRATE (PF) 100 MCG/2ML IJ SOLN
25.0000 ug | INTRAMUSCULAR | Status: DC | PRN
  Administered 2017-04-03: 50 ug via INTRAVENOUS

## 2017-04-03 MED ORDER — CEFOTETAN DISODIUM-DEXTROSE 2-2.08 GM-% IV SOLR: 2.0000 g | INTRAVENOUS | Status: DC

## 2017-04-03 MED ORDER — VASOPRESSIN 20 UNIT/ML IV SOLN
INTRAVENOUS | Status: AC
  Filled 2017-04-03: qty 1

## 2017-04-03 MED ORDER — PROPOFOL 10 MG/ML IV BOLUS
INTRAVENOUS | Status: DC | PRN
  Administered 2017-04-03: 200 mg via INTRAVENOUS

## 2017-04-03 MED ORDER — FENTANYL CITRATE (PF) 100 MCG/2ML IJ SOLN
INTRAMUSCULAR | Status: AC
  Administered 2017-04-03: 50 ug via INTRAVENOUS
  Filled 2017-04-03: qty 2

## 2017-04-03 MED ORDER — CEFOTETAN DISODIUM-DEXTROSE 2-2.08 GM-% IV SOLR
INTRAVENOUS | Status: AC
  Filled 2017-04-03: qty 50

## 2017-04-03 MED ORDER — FENTANYL CITRATE (PF) 100 MCG/2ML IJ SOLN
INTRAMUSCULAR | Status: AC
  Filled 2017-04-03: qty 2

## 2017-04-03 MED ORDER — SODIUM CHLORIDE 0.9 % IJ SOLN
INTRAMUSCULAR | Status: AC
  Filled 2017-04-03: qty 20

## 2017-04-03 MED ORDER — SODIUM CHLORIDE 0.9 % IR SOLN
Status: DC | PRN
  Administered 2017-04-03: 3000 mL

## 2017-04-03 SURGICAL SUPPLY — 23 items
CANISTER SUCT 3000ML PPV (MISCELLANEOUS) ×3 IMPLANT
CATH FOLEY 2WAY SLVR 30CC 16FR (CATHETERS) IMPLANT
CATH ROBINSON RED A/P 16FR (CATHETERS) ×3 IMPLANT
CLOTH BEACON ORANGE TIMEOUT ST (SAFETY) ×3 IMPLANT
CONTAINER PREFILL 10% NBF 60ML (FORM) ×6 IMPLANT
DEVICE MYOSURE LITE (MISCELLANEOUS) IMPLANT
DEVICE MYOSURE REACH (MISCELLANEOUS) ×3 IMPLANT
FILTER ARTHROSCOPY CONVERTOR (FILTER) ×3 IMPLANT
GLOVE BIOGEL PI IND STRL 7.0 (GLOVE) ×1 IMPLANT
GLOVE BIOGEL PI IND STRL 8 (GLOVE) ×1 IMPLANT
GLOVE BIOGEL PI INDICATOR 7.0 (GLOVE) ×2
GLOVE BIOGEL PI INDICATOR 8 (GLOVE) ×2
GLOVE ECLIPSE 7.5 STRL STRAW (GLOVE) ×6 IMPLANT
GOWN STRL REUS W/TWL LRG LVL3 (GOWN DISPOSABLE) ×6 IMPLANT
PACK VAGINAL MINOR WOMEN LF (CUSTOM PROCEDURE TRAY) ×3 IMPLANT
PAD OB MATERNITY 4.3X12.25 (PERSONAL CARE ITEMS) ×3 IMPLANT
PAD PREP 24X48 CUFFED NSTRL (MISCELLANEOUS) ×3 IMPLANT
PLUG CATH AND CAP STER (CATHETERS) IMPLANT
SEAL ROD LENS SCOPE MYOSURE (ABLATOR) ×3 IMPLANT
SYR 30ML LL (SYRINGE) IMPLANT
TOWEL OR 17X24 6PK STRL BLUE (TOWEL DISPOSABLE) ×6 IMPLANT
TUBING AQUILEX INFLOW (TUBING) ×3 IMPLANT
TUBING AQUILEX OUTFLOW (TUBING) ×3 IMPLANT

## 2017-04-03 NOTE — Anesthesia Preprocedure Evaluation (Signed)
Anesthesia Evaluation  Patient identified by MRN, date of birth, ID band Patient awake    Reviewed: Allergy & Precautions, NPO status , Patient's Chart, lab work & pertinent test results  Airway Mallampati: III  TM Distance: >3 FB Neck ROM: Full    Dental  (+) Teeth Intact, Dental Advisory Given   Pulmonary sleep apnea , COPD,  COPD inhaler, Current Smoker,    Pulmonary exam normal breath sounds clear to auscultation       Cardiovascular hypertension, Pt. on home beta blockers and Pt. on medications +CHF (diastolic dysfunction)  Normal cardiovascular exam Rhythm:Regular Rate:Normal  Echo 10/23/16: Study Conclusions  - Left ventricle: The cavity size is small. Wall thickness was   increased in a pattern of severe LVH. Systolic function was   vigorous. The estimated ejection fraction was 75%. Wall motion   was normal; there were no regional wall motion abnormalities.   Doppler parameters are consistent with abnormal left ventricular   relaxation (grade 1 diastolic dysfunction). The E/e&' ratio is   >15, suggesting elevated LV filling pressure. - Left atrium: The atrium was normal in size. - Inferior vena cava: The vessel was normal in size. The   respirophasic diameter changes were in the normal range (>= 50%),   consistent with normal central venous pressure.  Impressions:  - Compard to a prior study in 2015, the LVEF is higher at 75%.   There is now severe LV wall thickening, diastolic dysfunction and   elevated LV filling pressure.   Neuro/Psych  Headaches, PSYCHIATRIC DISORDERS Anxiety Depression  Neuromuscular disease (Right leg nerve pain) CVA, Residual Symptoms    GI/Hepatic GERD  Medicated,(+)     substance abuse  cocaine use,   Endo/Other  diabetes, Type 2, Oral Hypoglycemic Agents  Renal/GU negative Renal ROS     Musculoskeletal  (+) Arthritis ,   Abdominal   Peds  Hematology negative hematology  ROS (+)   Anesthesia Other Findings Day of surgery medications reviewed with the patient.  Reproductive/Obstetrics                             Anesthesia Physical Anesthesia Plan  ASA: III  Anesthesia Plan: General   Post-op Pain Management:    Induction: Intravenous  PONV Risk Score and Plan: 3 and Ondansetron, Dexamethasone, Propofol and Midazolam  Airway Management Planned: LMA  Additional Equipment:   Intra-op Plan:   Post-operative Plan: Extubation in OR  Informed Consent: I have reviewed the patients History and Physical, chart, labs and discussed the procedure including the risks, benefits and alternatives for the proposed anesthesia with the patient or authorized representative who has indicated his/her understanding and acceptance.   Dental advisory given  Plan Discussed with: CRNA  Anesthesia Plan Comments: (Risks/benefits of general anesthesia discussed with patient including risk of damage to teeth, lips, gum, and tongue, nausea/vomiting, allergic reactions to medications, and the possibility of heart attack, stroke and death.  All patient questions answered.  Patient wishes to proceed.)        Anesthesia Quick Evaluation

## 2017-04-03 NOTE — Anesthesia Procedure Notes (Signed)
Procedure Name: LMA Insertion Date/Time: 04/03/2017 7:41 AM Performed by: Hewitt Blade Pre-anesthesia Checklist: Patient identified, Emergency Drugs available, Suction available and Patient being monitored Patient Re-evaluated:Patient Re-evaluated prior to inductionOxygen Delivery Method: Circle system utilized Preoxygenation: Pre-oxygenation with 100% oxygen Intubation Type: IV induction LMA: LMA inserted LMA Size: 4.0 Number of attempts: 1 Placement Confirmation: positive ETCO2 and breath sounds checked- equal and bilateral Tube secured with: Tape Dental Injury: Teeth and Oropharynx as per pre-operative assessment

## 2017-04-03 NOTE — Telephone Encounter (Signed)
Left VM for Roberta at GI so she can call and schedule Pt.

## 2017-04-03 NOTE — Telephone Encounter (Signed)
Epidural ordered

## 2017-04-03 NOTE — Interval H&P Note (Signed)
History and Physical Interval Note:  04/03/2017 7:17 AM  Hailey Barnes  has presented today for surgery, with the diagnosis of endometrial polyp  The various methods of treatment have been discussed with the patient and family. After consideration of risks, benefits and other options for treatment, the patient has consented to  Procedure(s): [place] (N/A) as a surgical intervention .  The patient's history has been reviewed, patient examined, no change in status, stable for surgery.  I have reviewed the patient's chart and labs.  Questions were answered to the patient's satisfaction.     Terrance Mass

## 2017-04-03 NOTE — Op Note (Signed)
   Operative Note  04/03/2017  8:34 AM  PATIENT:  Hailey Barnes  56 y.o. female  PRE-OPERATIVE DIAGNOSIS:  endometrial polyp, postmenopausal bleeding  POST-OPERATIVE DIAGNOSIS:  endometrial polyp, postmenopausal bleeding  PROCEDURE:  Procedure(s): DILATATION & CURETTAGE/HYSTEROSCOPY WITH MYOSURE resectoscopic polypectomy,   SURGEON:  Surgeon(s): Terrance Mass, MD  ANESTHESIA:   general  FINDINGS:several endometrial polyps were noted both tubal ostia identified cervical canal clear  DESCRIPTION OF OPERATION:FINDINGS: The patient was taken to the operating room where she underwent a successful general endotracheal anesthesia. Patient had PAS stockings for DVT prophylaxis. She received 2 g of Ancef preop. Time out was undertaken to properly identify the patient and the proper operation schedule. The vagina and perineum were prepped and draped in usual sterile fashion. A red rubber Quentin Cornwall was inserted to empty the bladder is content for approximately 50 cc. Bimanual examination demonstrated an retroverted uterus. Patient's legs were in the high lithotomy position. A weighted speculum was placed in the posterior vaginal vault. A single-tooth tenaculum was placed on the anterior cervical lip. The uterus sounded to 8  1/2 cm. Pratt dilator were used to dilate the cervical canal to a 21 mm size. The Hologic Myosure resectoscopic morcellator with a scope of 6.25 mm and an operating blade of 3.0 mm was introduced into the intrauterine cavity. 0.9% normal saline was the distending media. Inspection of the endometrial cavity demonstrated multiple small uterine polyps located in the lower uterine segment. With the resectoscopic morcellator they were removed and submitted for histological evaluation. Fluid deficit was approximately less than 300 cc.The single-tooth tenaculum was removed patient was extubated transferred to recovery room stable vital signs blood loss was minimal. She received 30 mg of  Toradol in route to the recovery room.    ESTIMATED BLOOD LOSS: Minimal    BLOOD ADMINISTERED:none    LOCAL MEDICATIONS USED:  NONE  SPECIMEN:  Source of Specimen:  Endometrial polyps and curettage   DISPOSITION OF SPECIMEN:  PATHOLOGY  COUNTS:  YES  PLAN OF CARE: Transfer to PACU  [facility] HMD8:34 AMTD@

## 2017-04-03 NOTE — Transfer of Care (Signed)
Immediate Anesthesia Transfer of Care Note  Patient: Hailey Barnes  Procedure(s) Performed: Procedure(s): DILATATION & CURETTAGE/HYSTEROSCOPY WITH MYOSURE (N/A)  Patient Location: PACU  Anesthesia Type:General  Level of Consciousness: awake, alert  and oriented  Airway & Oxygen Therapy: Patient Spontanous Breathing and Patient connected to nasal cannula oxygen  Post-op Assessment: Report given to RN, Post -op Vital signs reviewed and stable and Patient moving all extremities  Post vital signs: Reviewed and stable  Last Vitals:  Vitals:   04/03/17 0640  BP: 134/87  Pulse: 85  Resp: 20  Temp: 36.6 C    Last Pain:  Vitals:   04/03/17 0640  TempSrc: Oral      Patients Stated Pain Goal: 3 (38/38/18 4037)  Complications: No apparent anesthesia complications

## 2017-04-03 NOTE — H&P (View-Only) (Signed)
Hailey Barnes is an 56 y.o. female who presented to the office today for preoperative examination.She was seen in the office on 02/17/2017. We will going to do an endometrial biopsy which had a vaginal discharge and she had bacterial vaginosis and was placed on Flagyl 500 mg twice a day for 7 days. Patient had a normal Pap smear less than 12 months ago. She had a complete STD to include HIV, RPR, hepatitis B and C recently which was normal. She was a cocaine addict 3 years ago. At time of her evaluation last year before her surgery she was noted on ultrasound had bilateral hydrosalpinx.  Review of her record indicated that in July 2017 had a resectoscopic myomectomy for dysfunction uterine bleeding and pathology report demonstrated the following:  Diagnosis Endometrium, curettage, with polyp SCANT INACTIVE ENDOMETRIUM FRAGMENTS OF SUBMUCOSA LEIOMYOMA  Patient had an ultrasound and sonohysterogram on 02/26/2017 used to history the following: Uterus measures 7.8 x 5.8 x 5.1 cm with endometrial stripe of 9.3 mm. Right ovary continued presence of a serpentine tubular cystic mass measuring 40 x 17 x 32 mm unchanged from previous scan the previous year. Thick walled follicle measuring 18 x 13 mm with negative color flow was noted left ovarian follicle 24 x 19 mm. Previous hydrosalpinx on the left not seen. No fluid in the cul-de-sac. The cervix was cleansed with Betadine solution and a sterile catheter was introduced into the uterine cavity and 11 x 5 mm and a 13 x 6 mm intrauterine defect was noted.  The assessment and plan at that office visit was as follows: "Patient with slight dysfunction bleeding attributed to endometrial polyp. Previous left hydrosalpinx longer seen persistent right hydrosalpinx unchanged in size in over year. Review of patient's record indicated that last year she had resectoscopic myomectomy. Because patient felt uncomfortable with doing an endometrial biopsy will be left for  the resectoscopic polypectomy and endometrial biopsy at time of her surgery.  Pertinent Gynecological History: Menses: Intermenstrual bleeding Bleeding: intermenstrual bleeding Contraception: none DES exposure: unknown Blood transfusions: none Sexually transmitted diseases: Gonorrhea, chlamydia, Trichomonas Previous GYN Procedures: D&C 2 also myomectomy and cesarean section  Last mammogram: normal Date: 2012 Last pap: normal Date: 2015 OB History: G 1, P 1     Menstrual History: Menarche age: 18 Patient's last menstrual period was 08/31/2015.    Past Medical History:  Diagnosis Date  . Arthritis    "knees" (02/21/2014), back  . Chest pressure 10/22/2016  . CHF (congestive heart failure) ([place])   . Chronic bronchitis ([place])    "get it q yr" (02/21/2014)  . Chronic diastolic CHF (congestive heart failure) ([place])    a. Echo 05/2014: EF 65-70% with Grade 1 DD.  Marland Kitchen Chronic lower back pain   . Complication of anesthesia    "I don't come out well; I chew on my tongue"  . COPD (chronic obstructive pulmonary disease) ([place])   . Diabetes mellitus without complication ([place])   . Fibroid   . GERD (gastroesophageal reflux disease)   . Heart murmur   . Hypertension   . Infection of right eye    current dx on Saturday 7/15 - using drops  . Migraine 1982-2009  . MVP (mitral valve prolapse)   . Neuromuscular disorder ([place])    right leg nerve pain  . Shortness of breath   . Sinus pause    a. noted on telemetry during admission from 10/2016, lasting up to 4.4 seconds. BB discontinued.   . Sleep  apnea 02/2016   recent hospitalization noted apnea during stay no sleep studt performed since this episode  . Stroke [facility]) noted on CAT 02/2014   "light", LLE weakness remains (03/30/2014)  . 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 Tingling in extremities 12/12/2010   Uric acid and electrolytes (02/23) normal but WBC elevated.   D/Dx: carpal tunnel,  ulnar neuropathy Less likely: cervical radiculopathy, vasculitis     Past Surgical History:  Procedure Laterality Date  . [place]  . DILATATION & CURETTAGE/HYSTEROSCOPY WITH MYOSURE N/A 05/06/2016   Procedure: DILATATION & CURETTAGE/HYSTEROSCOPY WITH MYOSURE;  Surgeon: Terrance Mass, MD;  Location: [place] [facility];  Service: Gynecology;  Laterality: N/A;  request to follow around 8:45  requests one hour OR time  . DILATION AND CURETTAGE OF UTERUS    . FOOT SURGERY Bilateral    "took bones out; put pins in"  . LEFT AND RIGHT HEART CATHETERIZATION WITH CORONARY ANGIOGRAM N/A 03/31/2014   Procedure: LEFT AND RIGHT HEART CATHETERIZATION WITH CORONARY ANGIOGRAM;  Surgeon: Birdie Riddle, MD;  Location: [place] CATH [facility];  Service: Cardiovascular;  Laterality: N/A;  . MYOMECTOMY    . sonohystogram  04/14/2016   [facility]: intramural fibroid, premenopausal endometrium, right fluid filled tubular structure    Family History  Problem Relation Age of Onset  . Hypertension Mother   . COPD Mother   . Hypertension Father   . Cancer Sister        UTERINE???  . COPD Sister     Social History:  reports that she has been smoking Cigarettes.  She has a 18.50 pack-year smoking history. She has never used smokeless tobacco. She reports that she does not drink alcohol or use drugs.  Allergies: No Known Allergies   (Not in a hospital admission)  REVIEW OF SYSTEMS: A ROS was performed and pertinent positives and negatives are included in the history.  GENERAL: No fevers or chills. HEENT: No change in vision, no earache, sore throat or sinus congestion. NECK: No pain or stiffness. CARDIOVASCULAR: No chest pain or pressure. No palpitations. PULMONARY: No shortness of breath, cough or wheeze. GASTROINTESTINAL: No abdominal pain, nausea, vomiting or diarrhea, melena or bright red blood per rectum. GENITOURINARY: No urinary frequency, urgency, hesitancy or dysuria. MUSCULOSKELETAL:  No joint or muscle pain, no back pain, no recent trauma. DERMATOLOGIC: No rash, no itching, no lesions. ENDOCRINE: No polyuria, polydipsia, no heat or cold intolerance. No recent change in weight. HEMATOLOGICAL: No anemia or easy bruising or bleeding. NEUROLOGIC: No headache, seizures, numbness, tingling or weakness. PSYCHIATRIC: No depression, no loss of interest in normal activity or change in sleep pattern.     Blood pressure 132/88, height 5' 3.25" (1.607 m), weight 296 lb 3.2 oz (134.4 kg), last menstrual period 10/15/2016.  Physical Exam: HEENT:unremarkable Neck:Supple, midline, no thyroid megaly, no carotid bruits Lungs:  Clear to auscultation no rhonchi's or wheezes Heart:Regular rate and rhythm, no murmurs or gallops Breast Exam: Symmetrical in appearance no palpable mass or tenderness no supraclavicular axillary lymphadenopathy Abdomen: Soft nontender no rebound or guarding Pelvic:BUS within normal limits Vagina: No lesions or discharge Cervix: No lesions or discharge Uterus: Difficult to assess due to patient's abdominal girth and pendulous abdomen Adnexa: Same as above Extremities: No cords, no edema Rectal: Not done    Assessment/Plan: Patient with postmenopausal bleeding sonohysterogram indicating recurrence of endometrial polyp. Endometrial biopsy not able to be performed  recently due to patient's discomfort. Patient will be scheduled for outpatient resectoscopic polypectomy. Last year she had resectoscopic myomectomy. The risks benefits and pros and cons of surgery discussed with the patient as follows:                        Patient was counseled as to the risk of surgery to include the following:  1. Infection (prohylactic antibiotics will be administered)  2. DVT/Pulmonary Embolism (prophylactic pneumo compression stockings will be used)  3.Trauma to internal organs requiring additional surgical procedure to repair any injury to     Internal organs requiring  perhaps additional hospitalization days.  4.Hemmorhage requiring transfusion and blood products which carry risks such as anaphylactic reaction, hepatitis and AIDS  Patient had received literature information on the procedure scheduled and all her questions were answered and fully accepts all risk.  Cardiac evaluation by Dr. Candee Furbish: "Preoperative cardiac risk stratification  - Planned anesthesia for polyp removal.. Abnormal uterine bleeding.  - Has been able to complete greater than 4 METS of activity. She does have chronic shortness of breath with activity  - Does not have any signs of acute heart failure currently.  - She may proceed with surgery with no further cardiac testing. She is of moderate risk with risks mainly based upon her obesity.  Chronic diastolic heart failure  - Continue with torsemide 60 mg Once a day  - Fluid restrict 1-1-1/2 L  - Prior creatinine ranges from 1.32-1.13"  [facility] HMD9:31 AMTD     Uvaldo Rising H 03/23/2017, 9:25 AM

## 2017-04-03 NOTE — Discharge Instructions (Signed)

## 2017-04-04 ENCOUNTER — Encounter (HOSPITAL_COMMUNITY): Payer: Self-pay | Admitting: Gynecology

## 2017-04-05 ENCOUNTER — Other Ambulatory Visit: Payer: Self-pay | Admitting: Family Medicine

## 2017-04-08 ENCOUNTER — Telehealth: Payer: Self-pay | Admitting: Family Medicine

## 2017-04-08 DIAGNOSIS — G4733 Obstructive sleep apnea (adult) (pediatric): Secondary | ICD-10-CM

## 2017-04-08 DIAGNOSIS — I5033 Acute on chronic diastolic (congestive) heart failure: Secondary | ICD-10-CM | POA: Diagnosis not present

## 2017-04-08 DIAGNOSIS — R269 Unspecified abnormalities of gait and mobility: Secondary | ICD-10-CM | POA: Diagnosis not present

## 2017-04-08 NOTE — Telephone Encounter (Signed)
Pt called because it is time for her refills on her C-PAP supplies. She would like to have the doctor send in a prescription for a Nose Mask instead of full face. jw

## 2017-04-08 NOTE — Telephone Encounter (Signed)
Form completed and given to Jazmin.  [name]. [name], [place] Resident PGY-3 04/08/2017 1:52 PM

## 2017-04-08 NOTE — Telephone Encounter (Signed)
Message sent to [facility] [employer] [facility] with [facility]. Jazmin Hartsell,CMA

## 2017-04-08 NOTE — Telephone Encounter (Signed)
Will forward to MD to place a DME order for these.  Jazmin Hartsell,CMA

## 2017-04-10 NOTE — Anesthesia Postprocedure Evaluation (Signed)
Anesthesia Post Note  Patient: Hailey Barnes  Procedure(s) Performed: Procedure(s) (LRB): DILATATION & CURETTAGE/HYSTEROSCOPY WITH MYOSURE (N/A)     Patient location during evaluation: PACU Anesthesia Type: General Level of consciousness: awake and alert Pain management: pain level controlled Vital Signs Assessment: post-procedure vital signs reviewed and stable Respiratory status: spontaneous breathing, nonlabored ventilation, respiratory function stable and patient connected to nasal cannula oxygen Cardiovascular status: blood pressure returned to baseline and stable Postop Assessment: no signs of nausea or vomiting Anesthetic complications: no    Last Vitals:  Vitals:   04/03/17 0930 04/03/17 1030  BP: 122/67 116/67  Pulse: 77 75  Resp: 17 (!) 22  Temp: 36.8 C     Last Pain:  Vitals:   04/03/17 0845  TempSrc:   PainSc: 7    Pain Goal: Patients Stated Pain Goal: 3 (04/03/17 0640)               Catalina Gravel

## 2017-04-13 ENCOUNTER — Ambulatory Visit
Admission: RE | Admit: 2017-04-13 | Discharge: 2017-04-13 | Disposition: A | Discharge status: Home / Self Care | Payer: Medicare HMO | Source: Ambulatory Visit | Attending: Family Medicine | Admitting: Family Medicine

## 2017-04-13 DIAGNOSIS — I5033 Acute on chronic diastolic (congestive) heart failure: Secondary | ICD-10-CM | POA: Diagnosis not present

## 2017-04-13 DIAGNOSIS — G4733 Obstructive sleep apnea (adult) (pediatric): Secondary | ICD-10-CM | POA: Diagnosis not present

## 2017-04-13 DIAGNOSIS — R269 Unspecified abnormalities of gait and mobility: Secondary | ICD-10-CM | POA: Diagnosis not present

## 2017-04-13 DIAGNOSIS — M545 Low back pain: Secondary | ICD-10-CM | POA: Diagnosis not present

## 2017-04-13 MED ORDER — METHYLPREDNISOLONE ACETATE 40 MG/ML INJ SUSP (RADIOLOG
120.0000 mg | Freq: Once | INTRAMUSCULAR | Status: AC
Start: 2017-04-13 — End: 2017-04-13
  Administered 2017-04-13: 120 mg via EPIDURAL

## 2017-04-13 MED ORDER — IOPAMIDOL (ISOVUE-M 200) INJECTION 41%
1.0000 mL | Freq: Once | INTRAMUSCULAR | Status: AC
  Administered 2017-04-13: 1 mL via EPIDURAL

## 2017-04-17 ENCOUNTER — Encounter: Payer: Self-pay | Admitting: Gynecology

## 2017-04-17 ENCOUNTER — Ambulatory Visit: Payer: Medicare HMO | Admitting: Gynecology

## 2017-04-17 VITALS — BP 124/80

## 2017-04-17 DIAGNOSIS — Z09 Encounter for follow-up examination after completed treatment for conditions other than malignant neoplasm: Secondary | ICD-10-CM

## 2017-04-17 DIAGNOSIS — N840 Polyp of corpus uteri: Secondary | ICD-10-CM | POA: Diagnosis not present

## 2017-04-17 MED ORDER — FLUCONAZOLE 150 MG PO TABS: 150.0000 mg | ORAL_TABLET | Freq: Once | ORAL | 5 refills | Status: AC

## 2017-04-17 NOTE — Progress Notes (Signed)
Patient ID: Hailey Barnes, female   DOB: Feb 23, 1961, 56 y.o.   MRN: [id number]    Patient is a 56 year old that presented to the office for her two-week postop visit. On 04/03/2017 as a result of postmenopausal bleeding and preop evaluation demonstrating an sonohysterogram and endometrial polyp she underwent a resectoscopic polypectomy. Patient doing well from her surgery. Findings from surgery were discussed as well as pictures shared and pathology report as follows:  FINDINGS:several endometrial polyps were noted both tubal ostia identified cervical canal clear  Pathology report: Diagnosis 1. Endometrium, curettage - SCANTY INACTIVE ENDOMETRIUM AND BLOOD. - NO HYPERPLASIA OR MALIGNANCY. 2. Endometrial polyp - INACTIVE ENDOMETRIUM, BENIGN ENDOMETRIAL POLYP AND BENIGN MYOMETRIUM. - NO HYPERPLASIA OR MALIGNANCY  Exam: Abdomen: Soft nontender no rebound guarding Pelvic: Bartholin urethra Skene was within normal limits Vagina: No lesions or discharge Cervix: No lesions or discharge Uterus upper limits of normal anteverted normal size shape and consistency nontender Adnexa: No palpable masses noted on rectal exam: Not done  Assessment/plan: Patient 2 weeks status post resectoscopic polypectomy as a result of endometrial polyps contributing to postmenopausal bleeding. Pathology report benign. Patient scheduled to return to the office the end of August early September for her annual exam. She has an upcoming colonoscopy is due for mammogram in August of this year as well.  Because of patient's history of diabetes and at times she has recurrent yeast infection and call group prescription of Diflucan 150 mg one by mouth with 5 refills when necessary

## 2017-04-20 ENCOUNTER — Other Ambulatory Visit: Payer: Self-pay

## 2017-04-23 ENCOUNTER — Encounter (HOSPITAL_COMMUNITY): Payer: Self-pay | Admitting: *Deleted

## 2017-04-26 DIAGNOSIS — R269 Unspecified abnormalities of gait and mobility: Secondary | ICD-10-CM | POA: Diagnosis not present

## 2017-04-26 DIAGNOSIS — I5033 Acute on chronic diastolic (congestive) heart failure: Secondary | ICD-10-CM | POA: Diagnosis not present

## 2017-04-28 ENCOUNTER — Encounter (HOSPITAL_COMMUNITY)
Admission: RE | Disposition: A | Discharge status: Home / Self Care | Payer: Self-pay | Source: Ambulatory Visit | Attending: Gastroenterology

## 2017-04-28 ENCOUNTER — Encounter (HOSPITAL_COMMUNITY): Payer: Self-pay | Admitting: *Deleted

## 2017-04-28 ENCOUNTER — Ambulatory Visit (HOSPITAL_COMMUNITY): Payer: Medicare HMO | Admitting: Anesthesiology

## 2017-04-28 ENCOUNTER — Ambulatory Visit (HOSPITAL_COMMUNITY)
Admission: RE | Admit: 2017-04-28 | Discharge: 2017-04-28 | Disposition: A | Discharge status: Home / Self Care | Payer: Medicare HMO | Source: Ambulatory Visit | Attending: Gastroenterology | Admitting: Gastroenterology

## 2017-04-28 DIAGNOSIS — K5730 Diverticulosis of large intestine without perforation or abscess without bleeding: Secondary | ICD-10-CM | POA: Diagnosis not present

## 2017-04-28 DIAGNOSIS — M1990 Unspecified osteoarthritis, unspecified site: Secondary | ICD-10-CM | POA: Insufficient documentation

## 2017-04-28 DIAGNOSIS — I341 Nonrheumatic mitral (valve) prolapse: Secondary | ICD-10-CM | POA: Diagnosis not present

## 2017-04-28 DIAGNOSIS — R1011 Right upper quadrant pain: Secondary | ICD-10-CM | POA: Diagnosis not present

## 2017-04-28 DIAGNOSIS — K824 Cholesterolosis of gallbladder: Secondary | ICD-10-CM

## 2017-04-28 DIAGNOSIS — I110 Hypertensive heart disease with heart failure: Secondary | ICD-10-CM | POA: Insufficient documentation

## 2017-04-28 DIAGNOSIS — G4730 Sleep apnea, unspecified: Secondary | ICD-10-CM | POA: Diagnosis not present

## 2017-04-28 DIAGNOSIS — Z79899 Other long term (current) drug therapy: Secondary | ICD-10-CM | POA: Insufficient documentation

## 2017-04-28 DIAGNOSIS — K760 Fatty (change of) liver, not elsewhere classified: Secondary | ICD-10-CM | POA: Diagnosis not present

## 2017-04-28 DIAGNOSIS — J449 Chronic obstructive pulmonary disease, unspecified: Secondary | ICD-10-CM | POA: Insufficient documentation

## 2017-04-28 DIAGNOSIS — Z8673 Personal history of transient ischemic attack (TIA), and cerebral infarction without residual deficits: Secondary | ICD-10-CM | POA: Diagnosis not present

## 2017-04-28 DIAGNOSIS — Z1211 Encounter for screening for malignant neoplasm of colon: Secondary | ICD-10-CM | POA: Insufficient documentation

## 2017-04-28 DIAGNOSIS — K219 Gastro-esophageal reflux disease without esophagitis: Secondary | ICD-10-CM | POA: Insufficient documentation

## 2017-04-28 DIAGNOSIS — F329 Major depressive disorder, single episode, unspecified: Secondary | ICD-10-CM | POA: Diagnosis not present

## 2017-04-28 DIAGNOSIS — K759 Inflammatory liver disease, unspecified: Secondary | ICD-10-CM | POA: Diagnosis not present

## 2017-04-28 DIAGNOSIS — F17210 Nicotine dependence, cigarettes, uncomplicated: Secondary | ICD-10-CM | POA: Insufficient documentation

## 2017-04-28 DIAGNOSIS — Z7984 Long term (current) use of oral hypoglycemic drugs: Secondary | ICD-10-CM | POA: Diagnosis not present

## 2017-04-28 DIAGNOSIS — F419 Anxiety disorder, unspecified: Secondary | ICD-10-CM | POA: Diagnosis not present

## 2017-04-28 DIAGNOSIS — I5032 Chronic diastolic (congestive) heart failure: Secondary | ICD-10-CM | POA: Insufficient documentation

## 2017-04-28 DIAGNOSIS — K2950 Unspecified chronic gastritis without bleeding: Secondary | ICD-10-CM | POA: Insufficient documentation

## 2017-04-28 DIAGNOSIS — D125 Benign neoplasm of sigmoid colon: Secondary | ICD-10-CM | POA: Insufficient documentation

## 2017-04-28 DIAGNOSIS — E119 Type 2 diabetes mellitus without complications: Secondary | ICD-10-CM | POA: Diagnosis not present

## 2017-04-28 DIAGNOSIS — Z1212 Encounter for screening for malignant neoplasm of rectum: Secondary | ICD-10-CM | POA: Diagnosis not present

## 2017-04-28 DIAGNOSIS — K469 Unspecified abdominal hernia without obstruction or gangrene: Secondary | ICD-10-CM | POA: Diagnosis not present

## 2017-04-28 HISTORY — DX: Family history of other specified conditions: Z84.89

## 2017-04-28 HISTORY — PX: ESOPHAGOGASTRODUODENOSCOPY (EGD) WITH PROPOFOL: SHX5813

## 2017-04-28 HISTORY — PX: COLONOSCOPY WITH PROPOFOL: SHX5780

## 2017-04-28 LAB — GLUCOSE, CAPILLARY: GLUCOSE-CAPILLARY: 124 mg/dL — AB (ref 65–99)

## 2017-04-28 SURGERY — COLONOSCOPY WITH PROPOFOL
Anesthesia: Monitor Anesthesia Care

## 2017-04-28 MED ORDER — LACTATED RINGERS IV SOLN
INTRAVENOUS | Status: DC
  Administered 2017-04-28 (×2): via INTRAVENOUS

## 2017-04-28 MED ORDER — SODIUM CHLORIDE 0.9 % IV SOLN: INTRAVENOUS | Status: DC

## 2017-04-28 MED ORDER — PROPOFOL 10 MG/ML IV BOLUS
INTRAVENOUS | Status: AC
  Filled 2017-04-28: qty 40

## 2017-04-28 MED ORDER — PROPOFOL 10 MG/ML IV BOLUS
INTRAVENOUS | Status: DC | PRN
  Administered 2017-04-28 (×8): 20 mg via INTRAVENOUS

## 2017-04-28 MED ORDER — PROPOFOL 500 MG/50ML IV EMUL
INTRAVENOUS | Status: DC | PRN
  Administered 2017-04-28: 100 ug/kg/min via INTRAVENOUS

## 2017-04-28 MED ORDER — PROPOFOL 10 MG/ML IV BOLUS
INTRAVENOUS | Status: AC
  Filled 2017-04-28: qty 20

## 2017-04-28 SURGICAL SUPPLY — 24 items

## 2017-04-28 NOTE — H&P (Signed)
HPI :  56 y/o female here for EGD and colonoscopy to evaluate abdominal pain, and for colon cancer screening.   No change in symptoms since I have seen her.   Past Medical History:  Diagnosis Date  . Arthritis    "knees" (02/21/2014), back  . CHF (congestive heart failure) ([place])   . Chronic bronchitis ([place])    "get it q yr" (02/21/2014)  . Chronic diastolic CHF (congestive heart failure) ([place])    a. Echo 05/2014: EF 65-70% with Grade 1 DD.  Marland Kitchen Chronic lower back pain   . Complication of anesthesia    "I don't come out well; I chew on my tongue"  . COPD (chronic obstructive pulmonary disease) ([place])   . Diabetes mellitus without complication ([place])    TYPE 2  . Family history of adverse reaction to anesthesia    BROTHER PONV  . Fatty liver    NONALCOLIC  . Fibroid   . GERD (gastroesophageal reflux disease)    history of  . Heart murmur   . Hypertension   . Infection of right eye    current dx on Saturday 7/15 - using drops  . Leg swelling    bilateral  . Migraine 1982-2009  . MVP (mitral valve prolapse)   . Neuromuscular disorder ([place])    right leg nerve pain  . Shortness of breath    WITH EXERTION  . Sinus pause    a. noted on telemetry during admission from 10/2016, lasting up to 4.4 seconds. BB discontinued.   . Sleep apnea 02/2016   CPAP 16 TO 20  . Stroke [facility]) noted on CAT 02/2014   "light", LLE weakness remains (03/30/2014)  . 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 Tingling in extremities 12/12/2010   Uric acid and electrolytes (02/23) normal but WBC elevated.   D/Dx: carpal tunnel, ulnar neuropathy Less likely: cervical radiculopathy, vasculitis      Past Surgical History:  Procedure Laterality Date  . [place]  . DILATATION & CURETTAGE/HYSTEROSCOPY WITH MYOSURE N/A 05/06/2016   Procedure: DILATATION & CURETTAGE/HYSTEROSCOPY WITH MYOSURE;  Surgeon: Terrance Mass, MD;  Location: [place] [facility];   Service: Gynecology;  Laterality: N/A;  request to follow around 8:45  requests one hour OR time  . DILATATION & CURETTAGE/HYSTEROSCOPY WITH MYOSURE N/A 04/03/2017   Procedure: DILATATION & CURETTAGE/HYSTEROSCOPY WITH MYOSURE;  Surgeon: Terrance Mass, MD;  Location: [place] [facility];  Service: Gynecology;  Laterality: N/A;  . DILATION AND CURETTAGE OF UTERUS    . FOOT SURGERY Bilateral    "took bones out; put pins in"  . LEFT AND RIGHT HEART CATHETERIZATION WITH CORONARY ANGIOGRAM N/A 03/31/2014   Procedure: LEFT AND RIGHT HEART CATHETERIZATION WITH CORONARY ANGIOGRAM;  Surgeon: Birdie Riddle, MD;  Location: [place] CATH LAB;  Service: Cardiovascular;  Laterality: N/A;  . MYOMECTOMY  YRS AGO  . sonohystogram  04/14/2016   [facility]: intramural fibroid, premenopausal endometrium, right fluid filled tubular structure   Family History  Problem Relation Age of Onset  . Hypertension Mother   . COPD Mother   . Hypertension Father   . Cancer Sister        UTERINE???  . COPD Sister   . Hypertension Sister   . Hypertension Sister    Social History  Substance Use Topics  . Smoking status: Current Every Day Smoker    Packs/day: 0.50    Years:  40.00    Types: Cigarettes  . Smokeless tobacco: Never Used     Comment: Would like to quit  . Alcohol use No   Current Facility-Administered Medications  Medication Dose Route Frequency Provider Last Rate Last Dose  . lactated ringers infusion   Intravenous Continuous Remona Boom, Renelda Loma, MD 20 mL/hr at 04/28/17 1039     No Known Allergies   Review of Systems: All systems reviewed and negative except where noted in HPI.    Dg Inject Diag/thera/inc Needle/cath/plc Epi/lumb/sac W/img  Result Date: 04/13/2017 CLINICAL DATA:  Lumbosacral spondylosis without myelopathy. Low back, buttock, and bilateral lower extremity pain. The patient reports near complete pain relief following the prior epidural injection, with pain recently  returning. FLUOROSCOPY TIME:  Radiation Exposure Index (as provided by the fluoroscopic device): 42.21 microGray*m^2 Fluoroscopy Time (in minutes and seconds):  5 seconds PROCEDURE: The procedure, risks, benefits, and alternatives were explained to the patient. Questions regarding the procedure were encouraged and answered. The patient understands and consents to the procedure. LUMBAR EPIDURAL INJECTION: An interlaminar approach was performed on the left at L4-5. The overlying skin was cleansed and anesthetized. A 6 inch 20 gauge epidural needle was advanced using loss-of-resistance technique. DIAGNOSTIC EPIDURAL INJECTION: Injection of Isovue-M 200 shows a good epidural pattern with spread above and below the level of needle placement, primarily on the left. No vascular opacification is seen. THERAPEUTIC EPIDURAL INJECTION: 120 mg of Depo-Medrol mixed with 3 mL of 1% lidocaine were instilled. The procedure was well-tolerated, and the patient was discharged thirty minutes following the injection in good condition. COMPLICATIONS: None IMPRESSION: Technically successful lumbar interlaminar epidural injection on the left at L4-5. Electronically Signed   By: Logan Bores M.D.   On: 04/13/2017 09:21    Physical Exam: BP (!) 162/91   Pulse 87   Temp 97.9 F (36.6 C) (Oral)   Resp 20   Ht 5' 2.5" (1.588 m)   Wt 297 lb 2.9 oz (134.8 kg)   LMP 10/15/2016   SpO2 95%   BMI 53.49 kg/m  Constitutional: Pleasant,well-developed,female in no acute distress. Cardiovascular: Normal rate, regular rhythm.  Pulmonary/chest: Effort normal and breath sounds normal. No wheezing, rales or rhonchi. Abdominal: Soft, protuberant / obese, nondistended, nontender.  There are no masses palpable. No hepatomegaly. Extremities: no edema  ASSESSMENT AND PLAN: 56 y/o female with history of intermittent RUQ pain - suspect could be biliary colic with gallbladder polyp noted on [place] however EGD to further evaluate. She is also here  for first time colonoscopy. BMI > 50, case done at hospital for anesthesia care. I have discussed risks / benefits of endoscopy with the patient, she wishes to proceed.   Sturgeon Cellar, MD [facility] Pager [phone]   No ref. provider found

## 2017-04-28 NOTE — Anesthesia Preprocedure Evaluation (Addendum)
Anesthesia Evaluation  Patient identified by MRN, date of birth, ID band Patient awake    Reviewed: Allergy & Precautions, NPO status , Patient's Chart, lab work & pertinent test results  Airway Mallampati: III  TM Distance: >3 FB Neck ROM: Full    Dental  (+) Teeth Intact, Dental Advisory Given   Pulmonary sleep apnea and Continuous Positive Airway Pressure Ventilation , COPD,  COPD inhaler, Current Smoker,    breath sounds clear to auscultation       Cardiovascular hypertension, Pt. on medications +CHF   Rhythm:Regular Rate:Normal     Neuro/Psych  Headaches, PSYCHIATRIC DISORDERS Anxiety Depression  Neuromuscular disease CVA    GI/Hepatic GERD  Medicated and Controlled,(+) Hepatitis -  Endo/Other  diabetes, Type 2, Oral Hypoglycemic Agents  Renal/GU Renal disease     Musculoskeletal  (+) Arthritis , Osteoarthritis,    Abdominal   Peds  Hematology negative hematology ROS (+)   Anesthesia Other Findings   Reproductive/Obstetrics negative OB ROS                            Lab Results  Component Value Date   WBC 11.7 (H) 03/23/2017   HGB 15.7 (H) 03/23/2017   HCT 45.8 03/23/2017   MCV 85.6 03/23/2017   PLT 217 03/23/2017   EKG: normal sinus rhythm.  Echo; - Left ventricle: The cavity size is small. Wall thickness was   increased in a pattern of severe LVH. Systolic function was   vigorous. The estimated ejection fraction was 75%. Wall motion   was normal; there were no regional wall motion abnormalities.   Doppler parameters are consistent with abnormal left ventricular   relaxation (grade 1 diastolic dysfunction). The E/e&' ratio is   >15, suggesting elevated LV filling pressure. - Left atrium: The atrium was normal in size. - Inferior vena cava: The vessel was normal in size. The   respirophasic diameter changes were in the normal range (>= 50%),   consistent with normal central  venous pressure.  Anesthesia Physical Anesthesia Plan  ASA: III  Anesthesia Plan: MAC   Post-op Pain Management:    Induction: Intravenous  PONV Risk Score and Plan: 0 and Propofol  Airway Management Planned:   Additional Equipment:   Intra-op Plan:   Post-operative Plan:   Informed Consent: I have reviewed the patients History and Physical, chart, labs and discussed the procedure including the risks, benefits and alternatives for the proposed anesthesia with the patient or authorized representative who has indicated his/her understanding and acceptance.     Plan Discussed with:   Anesthesia Plan Comments:         Anesthesia Quick Evaluation

## 2017-04-28 NOTE — Op Note (Signed)
[facility] ([facility]) Patient Name: Hailey Barnes Procedure Date: 04/28/2017 MRN: [id number] Attending MD: Carlota Raspberry. Shirl Weir MD, MD Date of Birth: 12-Jun-1961 CSN: [id number] Age: 56 Admit Type: Outpatient Procedure:                Colonoscopy Indications:              Screening for colorectal malignant neoplasm, This                            is the patient's first colonoscopy Providers:                Carlota Raspberry. Takeo Harts MD, MD, Cleda Daub, RN,                            Marcene Duos, Technician Referring MD:              Medicines:                Monitored Anesthesia Care Complications:            No immediate complications. Estimated blood loss:                            Minimal. Estimated Blood Loss:     Estimated blood loss was minimal. Procedure:                Pre-Anesthesia Assessment:                           - Prior to the procedure, a History and Physical                            was performed, and patient medications and                            allergies were reviewed. The patient's tolerance of                            previous anesthesia was also reviewed. The risks                            and benefits of the procedure and the sedation                            options and risks were discussed with the patient.                            All questions were answered, and informed consent                            was obtained. Prior Anticoagulants: The patient has                            taken no previous anticoagulant or antiplatelet                            agents. ASA  Grade Assessment: III - A patient with                            severe systemic disease. After reviewing the risks                            and benefits, the patient was deemed in                            satisfactory condition to undergo the procedure.                           After obtaining informed consent, the colonoscope                            was passed  under direct vision. Throughout the                            procedure, the patient's blood pressure, pulse, and                            oxygen saturations were monitored continuously. The                            EC-[id number] ([id number]) scope was introduced through                            the anus and advanced to the the cecum, identified                            by appendiceal orifice and ileocecal valve. The                            colonoscopy was performed without difficulty. The                            patient tolerated the procedure well. The quality                            of the bowel preparation was good. The ileocecal                            valve, appendiceal orifice, and rectum were                            photographed. Scope In: 11:33:20 AM Scope Out: 11:51:18 AM Scope Withdrawal Time: 0 hours 15 minutes 26 seconds  Total Procedure Duration: 0 hours 17 minutes 58 seconds  Findings:      The perianal and digital rectal examinations were normal.      A 5 mm polyp was found in the sigmoid colon. The polyp was sessile. The       polyp was removed with a cold snare. Resection and retrieval were       complete.      A  few small-mouthed diverticula were found in the sigmoid colon.      The exam was otherwise without abnormality on direct and retroflexion       views. Impression:               - One 5 mm polyp in the sigmoid colon, removed with                            a cold snare. Resected and retrieved.                           - Diverticulosis in the sigmoid colon.                           - The examination was otherwise normal on direct                            and retroflexion views. Moderate Sedation:      No moderate sedation, case performed with MAC Recommendation:           - Patient has a contact number available for                            emergencies. The signs and symptoms of potential                            delayed complications  were discussed with the                            patient. Return to normal activities tomorrow.                            Written discharge instructions were provided to the                            patient.                           - Resume previous diet.                           - Continue present medications.                           - Await pathology results.                           - Repeat colonoscopy is recommended for                            surveillance. The colonoscopy date will be                            determined after pathology results from today's                            exam become available  for review.                           - No ibuprofen, naproxen, or other non-steroidal                            anti-inflammatory drugs for 2 weeks after polyp                            removal. Procedure Code(s):        --- Professional ---                           [phone], Colonoscopy, flexible; with removal of                            tumor(s), polyp(s), or other lesion(s) by snare                            technique Diagnosis Code(s):        --- Professional ---                           Z12.11, Encounter for screening for malignant                            neoplasm of colon                           D12.5, Benign neoplasm of sigmoid colon                           K57.30, Diverticulosis of large intestine without                            perforation or abscess without bleeding CPT copyright 2016 American Medical Association. All rights reserved. The codes documented in this report are preliminary and upon coder review may  be revised to meet current compliance requirements. Remo Lipps P. Brendt Dible MD, MD 04/28/2017 11:55:48 AM This report has been signed electronically. Number of Addenda: 0

## 2017-04-28 NOTE — Discharge Instructions (Signed)
YOU HAD AN ENDOSCOPIC PROCEDURE TODAY: Refer to the procedure report and other information in the discharge instructions given to you for any specific questions about what was found during the examination. If this information does not answer your questions, please call [place] office at [phone] to clarify.  ° °YOU SHOULD EXPECT: Some feelings of bloating in the abdomen. Passage of more gas than usual. Walking can help get rid of the air that was put into your GI tract during the procedure and reduce the bloating. If you had a lower endoscopy (such as a colonoscopy or flexible sigmoidoscopy) you may notice spotting of blood in your stool or on the toilet paper. Some abdominal soreness may be present for a day or two, also. ° °DIET: Your first meal following the procedure should be a light meal and then it is ok to progress to your normal diet. A half-sandwich or bowl of soup is an example of a good first meal. Heavy or fried foods are harder to digest and may make you feel nauseous or bloated. Drink plenty of fluids but you should avoid alcoholic beverages for 24 hours. If you had a esophageal dilation, please see attached instructions for diet.   ° °ACTIVITY: Your care partner should take you home directly after the procedure. You should plan to take it easy, moving slowly for the rest of the day. You can resume normal activity the day after the procedure however YOU SHOULD NOT DRIVE, use power tools, machinery or perform tasks that involve climbing or major physical exertion for 24 hours (because of the sedation medicines used during the test).  ° °SYMPTOMS TO REPORT IMMEDIATELY: °A gastroenterologist can be reached at any hour. Please call [phone]  for any of the following symptoms:  °Following lower endoscopy (colonoscopy, flexible sigmoidoscopy) °Excessive amounts of blood in the stool  °Significant tenderness, worsening of abdominal pains  °Swelling of the abdomen that is new, acute  °Fever of 100° or  higher  °Following upper endoscopy (EGD, EUS, ERCP, esophageal dilation) °Vomiting of blood or coffee ground material  °New, significant abdominal pain  °New, significant chest pain or pain under the shoulder blades  °Painful or persistently difficult swallowing  °New shortness of breath  °Black, tarry-looking or red, bloody stools ° °FOLLOW UP:  °If any biopsies were taken you will be contacted by phone or by letter within the next 1-3 weeks. Call [phone]  if you have not heard about the biopsies in 3 weeks.  °Please also call with any specific questions about appointments or follow up tests. ° °

## 2017-04-28 NOTE — Op Note (Signed)
[facility] Patient Name: Hailey Barnes Procedure Date: 04/28/2017 MRN: [id number] Attending MD: Carlota Raspberry. Kayman Snuffer MD, MD Date of Birth: 1960-11-14 CSN: [id number] Age: 56 Admit Type: Outpatient Procedure:                Upper GI endoscopy Indications:              Abdominal pain in the right upper quadrant Providers:                Carlota Raspberry. Hulan Szumski MD, MD, Cleda Daub, RN,                            Marcene Duos, Technician Referring MD:              Medicines:                Monitored Anesthesia Care Complications:            No immediate complications. Estimated blood loss:                            Minimal. Estimated Blood Loss:     Estimated blood loss was minimal. Procedure:                Pre-Anesthesia Assessment:                           - Prior to the procedure, a History and Physical                            was performed, and patient medications and                            allergies were reviewed. The patient's tolerance of                            previous anesthesia was also reviewed. The risks                            and benefits of the procedure and the sedation                            options and risks were discussed with the patient.                            All questions were answered, and informed consent                            was obtained. Prior Anticoagulants: The patient has                            taken no previous anticoagulant or antiplatelet                            agents. ASA Grade Assessment: III - A patient with  severe systemic disease. After reviewing the risks                            and benefits, the patient was deemed in                            satisfactory condition to undergo the procedure.                           After obtaining informed consent, the endoscope was                            passed under direct vision. Throughout the   procedure, the patient's blood pressure, pulse, and                            oxygen saturations were monitored continuously. The                            [id number]) scope was introduced through the                            mouth, and advanced to the second part of duodenum.                            The upper GI endoscopy was accomplished without                            difficulty. The patient tolerated the procedure                            well. Scope In: Scope Out: Findings:      Esophagogastric landmarks were identified: the Z-line was found at 40       cm, the gastroesophageal junction was found at 40 cm and the upper       extent of the gastric folds was found at 40 cm from the incisors.      The exam of the esophagus was otherwise normal.      The entire examined stomach was normal. Biopsies were taken with a cold       forceps for Helicobacter pylori testing.      The duodenal bulb and second portion of the duodenum were normal. Impression:               - Esophagogastric landmarks identified.                           - Normal esophagus                           - Normal stomach. Biopsied.                           - Normal duodenal bulb and second portion of the  duodenum.                           Overall, no overt cause for abdominal pain on this                            exam, which may more likely represent biliary colic                            from gallbladder polyp. Biopsies taken to rule out                            H pylori. Moderate Sedation:      No moderate sedation, case performed with MAC Recommendation:           - Patient has a contact number available for                            emergencies. The signs and symptoms of potential                            delayed complications were discussed with the                            patient. Return to normal activities tomorrow.                            Written  discharge instructions were provided to the                            patient.                           - Resume previous diet.                           - Continue present medications.                           - Await pathology results.                           - If symptoms persist, recommend general surgery                            consultation for cholecystectomy Procedure Code(s):        --- Professional ---                           [phone], Esophagogastroduodenoscopy, flexible,                            transoral; with biopsy, single or multiple Diagnosis Code(s):        --- Professional ---                           R10.11, Right upper quadrant  pain CPT copyright 2016 American Medical Association. All rights reserved. The codes documented in this report are preliminary and upon coder review may  be revised to meet current compliance requirements. Remo Lipps P. Keller Bounds MD, MD 04/28/2017 12:00:08 PM This report has been signed electronically. Number of Addenda: 0

## 2017-04-28 NOTE — Transfer of Care (Signed)
Immediate Anesthesia Transfer of Care Note  Patient: Hailey Barnes  Procedure(s) Performed: Procedure(s): COLONOSCOPY WITH PROPOFOL (N/A) ESOPHAGOGASTRODUODENOSCOPY (EGD) WITH PROPOFOL (N/A)  Patient Location: PACU  Anesthesia Type:MAC  Level of Consciousness: sedated  Airway & Oxygen Therapy: Patient Spontanous Breathing and Patient connected to nasal cannula oxygen  Post-op Assessment: Report given to RN and Post -op Vital signs reviewed and stable  Post vital signs: Reviewed and stable  Last Vitals:  Vitals:   04/28/17 1035  BP: (!) 162/91  Pulse: 87  Resp: 20  Temp: 36.6 C    Last Pain:  Vitals:   04/28/17 1035  TempSrc: Oral         Complications: No apparent anesthesia complications

## 2017-04-28 NOTE — Interval H&P Note (Signed)
History and Physical Interval Note:  04/28/2017 11:10 AM  Hailey Barnes  has presented today for surgery, with the diagnosis of Colon Cancer Screening, RUQ Pain, AH  The various methods of treatment have been discussed with the patient and family. After consideration of risks, benefits and other options for treatment, the patient has consented to  Procedure(s): COLONOSCOPY WITH PROPOFOL (N/A) ESOPHAGOGASTRODUODENOSCOPY (EGD) WITH PROPOFOL (N/A) as a surgical intervention .  The patient's history has been reviewed, patient examined, no change in status, stable for surgery.  I have reviewed the patient's chart and labs.  Questions were answered to the patient's satisfaction.     Renelda Loma Toniette Devera

## 2017-04-29 ENCOUNTER — Ambulatory Visit (INDEPENDENT_AMBULATORY_CARE_PROVIDER_SITE_OTHER): Payer: Medicare HMO | Admitting: Family Medicine

## 2017-04-29 ENCOUNTER — Encounter: Payer: Self-pay | Admitting: Family Medicine

## 2017-04-29 VITALS — BP 158/96 | HR 89 | Temp 98.1°F

## 2017-04-29 DIAGNOSIS — M25571 Pain in right ankle and joints of right foot: Secondary | ICD-10-CM

## 2017-04-29 DIAGNOSIS — M25579 Pain in unspecified ankle and joints of unspecified foot: Secondary | ICD-10-CM | POA: Insufficient documentation

## 2017-04-29 MED ORDER — TRAMADOL HCL 50 MG PO TABS: 50.0000 mg | ORAL_TABLET | Freq: Three times a day (TID) | ORAL | 0 refills | Status: AC | PRN

## 2017-04-29 NOTE — Progress Notes (Signed)
.   Subjective:    Hailey Barnes is a 56 y.o. female who presents with right ankle pain. Onset of the symptoms was 5 day ago. Inciting event: none known. Current symptoms include: inability to bear weight and swelling. Aggravating factors: standing and weight bearing. Symptoms have stabilized. Patient has had no prior ankle problems. Evaluation to date: none. Treatment to date: OTC analgesics which are not very effective. The following portions of the patient's history were reviewed and updated as appropriate: allergies, current medications, past family history, past social history and past surgical history.    ROS: Per HPI  Objective:    BP (!) 158/96   Pulse 89   Temp 98.1 F (36.7 C) (Oral)   SpO2 98%  Right ankle:   positive findings: decreased range of motion, effustion globally and tenderness globlally to light touch  Left ankle:   no effusion, full range of motion, no tenderness.   Imaging: X-ray of the right ankle(s): no fracture, dislocation, swelling or degenerative changes noted    Assessment and Plan:   Pain in joint, ankle and foot Unilateral ankle swelling in the setting of no trauma and recent hospitalization, I am concerned for possible dvt, other possible processes include venus insufficiency, arthritis (gout vs. Osteo). Will get doppler US to r/o dvt and xray to r/o arthritis. Will treat accordingly.   Marny Lowenstein, MD, [place] - PGY1 04/30/2017 8:18 AM

## 2017-04-29 NOTE — Patient Instructions (Signed)
It was a pleasure to see you today! Thank you for choosing [facility] for your primary care. Hailey Barnes was seen for right ankle swelling and pain. Please go get for ultrasound of your leg and x-ray at [facility] today. This is to rule out possible blood clot or any type of bony problem with her ankle. Your prescription for a short dose of tramadol was given to you.  Come back to the clinic if swelling or pain worsens, and go to the emergency room if develop any shortness of breath, chest pain.   Best,  Marny Lowenstein, MD, [place] - PGY1 04/29/2017 9:52 AM

## 2017-04-29 NOTE — Anesthesia Postprocedure Evaluation (Signed)
Anesthesia Post Note  Patient: Kemari Mares  Procedure(s) Performed: Procedure(s) (LRB): COLONOSCOPY WITH PROPOFOL (N/A) ESOPHAGOGASTRODUODENOSCOPY (EGD) WITH PROPOFOL (N/A)     Patient location during evaluation: PACU Anesthesia Type: MAC Level of consciousness: awake and alert Pain management: pain level controlled Vital Signs Assessment: post-procedure vital signs reviewed and stable Respiratory status: spontaneous breathing, nonlabored ventilation, respiratory function stable and patient connected to nasal cannula oxygen Cardiovascular status: stable and blood pressure returned to baseline Anesthetic complications: no    Last Vitals:  Vitals:   04/28/17 1210 04/28/17 1220  BP: (!) 143/88 (!) 151/69  Pulse: 88 81  Resp: 16 19  Temp:      Last Pain:  Vitals:   04/28/17 1200  TempSrc: Oral                 Effie Berkshire

## 2017-04-30 ENCOUNTER — Ambulatory Visit (HOSPITAL_COMMUNITY)
Admission: RE | Admit: 2017-04-30 | Discharge: 2017-04-30 | Disposition: A | Discharge status: Home / Self Care | Payer: Medicare HMO | Source: Ambulatory Visit | Attending: Family Medicine | Admitting: Family Medicine

## 2017-04-30 DIAGNOSIS — M25571 Pain in right ankle and joints of right foot: Secondary | ICD-10-CM | POA: Insufficient documentation

## 2017-04-30 DIAGNOSIS — M7989 Other specified soft tissue disorders: Secondary | ICD-10-CM | POA: Diagnosis not present

## 2017-04-30 NOTE — Progress Notes (Signed)
*  PRELIMINARY RESULTS* Vascular Ultrasound Right lower extremtiy venous duplex has been completed.  Preliminary findings: No evidence of deep vein thrombosis in the visualized veins of the right lower extremity.  Negative for baker's cyst.  Attempted to call provider at 14:45, waited on hold for 7 minutes, no answer.  Everrett Coombe 04/30/2017, 2:49 PM

## 2017-04-30 NOTE — Assessment & Plan Note (Signed)
Unilateral ankle swelling in the setting of no trauma and recent hospitalization, I am concerned for possible dvt, other possible processes include venus insufficiency, arthritis (gout vs. Osteo). Will get doppler US to r/o dvt and xray to r/o arthritis. Will treat accordingly.

## 2017-05-01 ENCOUNTER — Telehealth: Payer: Self-pay | Admitting: *Deleted

## 2017-05-01 NOTE — Telephone Encounter (Signed)
-----   Message from Bonnita Hollow, MD sent at 05/01/2017 11:29 AM EDT ----- Called pt to inform her that no blood clot was found on imaging.

## 2017-05-01 NOTE — Telephone Encounter (Signed)
Patient is aware of results. Hailey Barnes,CMA

## 2017-05-05 ENCOUNTER — Telehealth: Payer: Self-pay

## 2017-05-05 NOTE — Telephone Encounter (Signed)
Referral faxed to [facility] for consult of gallbladder polyp.

## 2017-05-08 ENCOUNTER — Telehealth: Payer: Self-pay

## 2017-05-08 DIAGNOSIS — R269 Unspecified abnormalities of gait and mobility: Secondary | ICD-10-CM | POA: Diagnosis not present

## 2017-05-08 DIAGNOSIS — G4733 Obstructive sleep apnea (adult) (pediatric): Secondary | ICD-10-CM | POA: Diagnosis not present

## 2017-05-08 DIAGNOSIS — I5033 Acute on chronic diastolic (congestive) heart failure: Secondary | ICD-10-CM | POA: Diagnosis not present

## 2017-05-08 NOTE — Telephone Encounter (Signed)
Pt has an appt with [facility] 05/29/2017 at 10am. Pt needs to arrive at 9:30. Appt is with Dr. Georgette Dover. Pt was informed by [facility] of appt date/time.

## 2017-05-25 ENCOUNTER — Encounter: Payer: Medicare Other | Admitting: Gynecology

## 2017-05-27 DIAGNOSIS — I5033 Acute on chronic diastolic (congestive) heart failure: Secondary | ICD-10-CM | POA: Diagnosis not present

## 2017-05-27 DIAGNOSIS — R269 Unspecified abnormalities of gait and mobility: Secondary | ICD-10-CM | POA: Diagnosis not present

## 2017-05-29 ENCOUNTER — Ambulatory Visit: Payer: Self-pay | Admitting: Surgery

## 2017-05-29 DIAGNOSIS — K811 Chronic cholecystitis: Secondary | ICD-10-CM | POA: Diagnosis not present

## 2017-05-29 NOTE — H&P (Signed)
History of Present Illness Hailey Barnes. Hailey Stirewalt MD; 05/29/2017 12:47 PM) The patient is a 56 year old female who presents with abdominal pain. Referred by Dr. Nicki Guadalajara for gallbladder disease Primary care physician Dr. Dimas Chyle This is a 56 year old female with morbid obesity, history of CVA, OSA, CHF, COPD, DM 1 who presents with a two-year history of intermittent right upper quadrant abdominal pain. This tends to be postprandial and seems to be worse with greasy foods. However recently, she has been experiencing symptoms even after drinking water. Her symptoms seem to be worsening. She does have some associated nausea but no vomiting. She has also experienced diarrhea. She was evaluated by GI who performed an EGD and colonoscopy. These did not show any obvious etiology for her symptoms. She had an ultrasound as well as HIDA scan that were both within normal limits. The most recent ultrasound did show a small gallbladder polyp. Because of her continuing symptoms as well as her new finding of a gallbladder polyp, she is now referred to discuss cholecystectomy.  CLINICAL DATA: RIGHT upper quadrant pain, nausea, and vomiting for 1 week, known fatty liver disease  EXAM: NUCLEAR MEDICINE HEPATOBILIARY IMAGING WITH GALLBLADDER EF  TECHNIQUE: Sequential images of the abdomen were obtained out to 60 minutes following intravenous administration of radiopharmaceutical. After oral ingestion of Ensure, gallbladder ejection fraction was determined. At 60 min, normal ejection fraction is greater than 33%.  RADIOPHARMACEUTICALS: 5.1 mCi Tc-74mCholetec IV  COMPARISON: None  FINDINGS: Patient motion artifacts throughout exam.  Normal tracer extraction from bloodstream indicating normal hepatocellular function.  Normal excretion of tracer into biliary tree.  Gallbladder visualized at 31 min.  Small bowel visualized at 42 min.  No hepatic retention of tracer.  Subjectively  normal emptying of tracer from gallbladder following fatty meal stimulation.  Calculated gallbladder ejection fraction is 89% , normal.  Patient reported no symptoms following Ensure ingestion.  Normal gallbladder ejection fraction following Ensure ingestion is greater than 33% at 1 hour.  IMPRESSION: Normal exam.   Electronically Signed By: [name] On: 12/04/2016 10:32  CLINICAL DATA: Right upper quadrant pain over the last 2 weeks.  EXAM: [place] LIMITED - RIGHT UPPER QUADRANT  COMPARISON: 12/02/2016  FINDINGS: Gallbladder:  Wall thickness is normal. No surrounding fluid. No Murphy sign. No shadowing stones. 8 mm gallbladder polyp demonstrated.  Common bile duct:  Diameter: 5 mm, normal  Liver:  Echogenic suggesting fatty change. No focal lesion or ductal dilatation.  IMPRESSION: Fatty liver.  Gallbladder polyp. No evidence of shadowing stone, gallbladder inflammation or biliary obstruction.   Electronically Signed By: [name] On: 03/20/2017 10:14    Past Surgical History ([name] RDewy Rose 05/29/2017 10:04 AM) Cesarean Section - 1 Foot Surgery Bilateral.  Diagnostic Studies History ([name] RReddick 05/29/2017 10:04 AM) Colonoscopy within last year Mammogram 1-3 years ago Pap Smear 1-5 years ago  Allergies ([name] 05/29/2017 10:06 AM) No Known Drug Allergies 05/29/2017 Allergies Reconciled  Medication History ([name] RHaz[place] 05/29/2017 10:08 AM) Albuterol (90MCG/ACT Aerosol Soln, Inhalation) Active. Aspirin (81MG Tablet, Oral) Active. HydrOXYzine HCl (50MG Tablet, Oral) Active. Mometasone Furoate (100MCG/ACT Aerosol, Inhalation) Active. Lisinopril (20MG Tablet, Oral) Active. Medications Reconciled  Social History ([name] RRuby 05/29/2017 10:04 AM) Alcohol use Remotely quit alcohol use. Caffeine use Carbonated beverages, Coffee, Tea. Illicit drug use  Remotely quit drug use. Tobacco use Current every day smoker.  Family History ([name] [place] 05/29/2017 10:04 AM) Anesthetic  complications Daughter, Family Members In General. Colon Polyps Family Members In General. Depression Father. Diabetes Mellitus Father. Hypertension Brother, Daughter, Father, Mother, Sister. Respiratory Condition Sister.  Pregnancy / Birth History ([name], [name]; 05/29/2017 10:04 AM) Age at menarche 89 years. Age of menopause 21-55 Gravida 1 Irregular periods Maternal age 38-20 Para 1  Other Problems ([name], [name]; 05/29/2017 10:04 AM) Arthritis Back Pain Cerebrovascular Accident Cholelithiasis Chronic Obstructive Lung Disease Congestive Heart Failure Diabetes Mellitus General anesthesia - complications High blood pressure Sleep Apnea     Review of Systems ([name]; 05/29/2017 10:04 AM) General Present- Chills, Fatigue and Fever. Not Present- Appetite Loss, Night Sweats, Weight Gain and Weight Loss. HEENT Present- Wears glasses/contact lenses. Not Present- Earache, Hearing Loss, Hoarseness, Nose Bleed, Oral Ulcers, Ringing in the Ears, Seasonal Allergies, Sinus Pain, Sore Throat, Visual Disturbances and Yellow Eyes. Breast Not Present- Breast Mass, Breast Pain, Nipple Discharge and Skin Changes. Cardiovascular Present- Difficulty Breathing Lying Down, Shortness of Breath and Swelling of Extremities. Not Present- Chest Pain, Leg Cramps, Palpitations and Rapid Heart Rate. Gastrointestinal Present- Abdominal Pain and Nausea. Not Present- Bloating, Bloody Stool, Change in Bowel Habits, Chronic diarrhea, Constipation, Difficulty Swallowing, Excessive gas, Gets full quickly at meals, Hemorrhoids, Indigestion, Rectal Pain and Vomiting. Female Genitourinary Not Present- Frequency, Nocturia, Painful Urination, Pelvic Pain and Urgency. Musculoskeletal Present- Back Pain, Joint Pain, Joint Stiffness and  Swelling of Extremities. Not Present- Muscle Pain and Muscle Weakness. Neurological Present- Trouble walking. Not Present- Decreased Memory, Fainting, Headaches, Numbness, Seizures, Tingling, Tremor and Weakness. Psychiatric Not Present- Anxiety, Bipolar, Change in Sleep Pattern, Depression, Fearful and Frequent crying. Endocrine Not Present- Cold Intolerance, Excessive Hunger, Hair Changes, Heat Intolerance, Hot flashes and New Diabetes. Hematology Not Present- Blood Thinners, Easy Bruising, Excessive bleeding, Gland problems, HIV and Persistent Infections.  Vitals ([name]; 05/29/2017 10:06 AM) 05/29/2017 10:05 AM Weight: 292.4 lb Height: 62in Body Surface Area: 2.25 m Body Mass Index: 53.48 kg/m  Temp.: 98.85F  Pulse: 105 (Regular)  P.OX: 92% (Room air) BP: 158/98 (Sitting, Left Arm, Standard)      Physical Exam Rodman Key K. Kameah Rawl MD; 05/29/2017 12:48 PM)  The physical exam findings are as follows: Note:WDWN in NAD Eyes: Pupils equal, round; sclera anicteric HENT: Oral mucosa moist; good dentition Neck: No masses palpated, no thyromegaly Lungs: CTA bilaterally; normal respiratory effort CV: Regular rate and rhythm; no murmurs; extremities well-perfused with no edema Abd: +bowel sounds, obese, soft, tender in RUQ around to flank, no palpable organomegaly; no palpable hernias Skin: Warm, dry; no sign of jaundice Psychiatric - alert and oriented x 4; calm mood and affect    Assessment & Plan Rodman Key K. 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 The surgical procedure has been discussed with the patient. Potential risks, benefits, alternative treatments, and expected outcomes have been explained. All of the patient's questions at this time have been answered. The likelihood of reaching the patient's treatment goal is  good. The patient understand the proposed surgical procedure and wishes to proceed. Note:The patient is considering possible bariatric surgery but she does not want to wait several months to have her cholecystectomy. We will proceed with her cholecystectomy and then she will begin the process to be evaluated for bariatric surgery.  Hailey Barnes. Georgette Dover, MD, [facility]  General/ Trauma Surgery  05/29/2017 12:49 PM

## 2017-06-01 ENCOUNTER — Ambulatory Visit (INDEPENDENT_AMBULATORY_CARE_PROVIDER_SITE_OTHER): Payer: Medicare HMO | Admitting: Women's Health

## 2017-06-01 ENCOUNTER — Other Ambulatory Visit: Payer: Self-pay | Admitting: Women's Health

## 2017-06-01 ENCOUNTER — Encounter: Payer: Self-pay | Admitting: Women's Health

## 2017-06-01 VITALS — BP 136/80

## 2017-06-01 DIAGNOSIS — B9689 Other specified bacterial agents as the cause of diseases classified elsewhere: Secondary | ICD-10-CM | POA: Diagnosis not present

## 2017-06-01 DIAGNOSIS — Z113 Encounter for screening for infections with a predominantly sexual mode of transmission: Secondary | ICD-10-CM

## 2017-06-01 DIAGNOSIS — N898 Other specified noninflammatory disorders of vagina: Secondary | ICD-10-CM

## 2017-06-01 DIAGNOSIS — R300 Dysuria: Secondary | ICD-10-CM

## 2017-06-01 DIAGNOSIS — N760 Acute vaginitis: Secondary | ICD-10-CM | POA: Diagnosis not present

## 2017-06-01 LAB — URINALYSIS W MICROSCOPIC + REFLEX CULTURE
BILIRUBIN URINE: NEGATIVE
CRYSTALS: NONE SEEN [HPF]
Casts: NONE SEEN [LPF]
GLUCOSE, UA: NEGATIVE
Hgb urine dipstick: NEGATIVE
KETONES UR: NEGATIVE
Nitrite: NEGATIVE
PROTEIN: NEGATIVE
RBC / HPF: NONE SEEN RBC/HPF (ref <=2)
SPECIFIC GRAVITY, URINE: 1.015 (ref 1.001–1.035)
Yeast: NONE SEEN [HPF]
pH: 6.5 (ref 5.0–8.0)

## 2017-06-01 LAB — WET PREP FOR TRICH, YEAST, CLUE
TRICH WET PREP: NONE SEEN
WBC WET PREP: NONE SEEN
Yeast Wet Prep HPF POC: NONE SEEN

## 2017-06-01 MED ORDER — METRONIDAZOLE 500 MG PO TABS: 500.0000 mg | ORAL_TABLET | Freq: Two times a day (BID) | ORAL | 0 refills | Status: DC

## 2017-06-01 NOTE — Progress Notes (Signed)
Presents with complaint of vaginal discharge with mild itching and slight burning with urination at initiation of stream. Denies pain at end of stream of urination, fever. Has upper right abdominal pain is scheduled for a cholecystectomy. Has numerous health problems and is on disability,  CHF, hypertension, stroke, diabetes, and morbid obesity. 03/23/2017 benign endometrial polyp per Dr.Fernandez,  no bleeding since. New partner, condom broke, had negative STD screen 02/2017. Declines HIV, hepatitis or RPR testing.  Exam: Appears well. Abdomen morbidly obese, nontender, external genitalia within normal limits, speculum exam moderate white adherent discharge, wet prep positive for moderate clues, TNTC bacteria. Negative for yeast. GC/Chlamydia culture taken. UA: +1 leukocytes, 6-10 WBCs, 6-10 squamous epithelium is, moderate bacteria, clue cells present.  Bacteria vaginosis  Plan: Options reviewed, Flagyl 500 twice daily for 7 days, alcohol precautions reviewed. Instructed to call if no relief of irritation. Urine culture pending. GC/Chlamydia culture pending.

## 2017-06-01 NOTE — Patient Instructions (Signed)
Bacterial Vaginosis Bacterial vaginosis is a vaginal infection that occurs when the normal balance of bacteria in the vagina is disrupted. It results from an overgrowth of certain bacteria. This is the most common vaginal infection among women ages 15-44. Because bacterial vaginosis increases your risk for STIs (sexually transmitted infections), getting treated can help reduce your risk for chlamydia, gonorrhea, herpes, and HIV (human immunodeficiency virus). Treatment is also important for preventing complications in pregnant women, because this condition can cause an early (premature) delivery. What are the causes? This condition is caused by an increase in harmful bacteria that are normally present in small amounts in the vagina. However, the reason that the condition develops is not fully understood. What increases the risk? The following factors may make you more likely to develop this condition:  Having a new sexual partner or multiple sexual partners.  Having unprotected sex.  Douching.  Having an intrauterine device (IUD).  Smoking.  Drug and alcohol abuse.  Taking certain antibiotic medicines.  Being pregnant.  You cannot get bacterial vaginosis from toilet seats, bedding, swimming pools, or contact with objects around you. What are the signs or symptoms? Symptoms of this condition include:  Grey or white vaginal discharge. The discharge can also be watery or foamy.  A fish-like odor with discharge, especially after sexual intercourse or during menstruation.  Itching in and around the vagina.  Burning or pain with urination.  Some women with bacterial vaginosis have no signs or symptoms. How is this diagnosed? This condition is diagnosed based on:  Your medical history.  A physical exam of the vagina.  Testing a sample of vaginal fluid under a microscope to look for a large amount of bad bacteria or abnormal cells. Your health care provider may use a cotton swab  or a small wooden spatula to collect the sample.  How is this treated? This condition is treated with antibiotics. These may be given as a pill, a vaginal cream, or a medicine that is put into the vagina (suppository). If the condition comes back after treatment, a second round of antibiotics may be needed. Follow these instructions at home: Medicines  Take over-the-counter and prescription medicines only as told by your health care provider.  Take or use your antibiotic as told by your health care provider. Do not stop taking or using the antibiotic even if you start to feel better. General instructions  If you have a female sexual partner, tell her that you have a vaginal infection. She should see her health care provider and be treated if she has symptoms. If you have a female sexual partner, he does not need treatment.  During treatment: ? Avoid sexual activity until you finish treatment. ? Do not douche. ? Avoid alcohol as directed by your health care provider. ? Avoid breastfeeding as directed by your health care provider.  Drink enough water and fluids to keep your urine clear or pale yellow.  Keep the area around your vagina and rectum clean. ? Wash the area daily with warm water. ? Wipe yourself from front to back after using the toilet.  Keep all follow-up visits as told by your health care provider. This is important. How is this prevented?  Do not douche.  Wash the outside of your vagina with warm water only.  Use protection when having sex. This includes latex condoms and dental dams.  Limit how many sexual partners you have. To help prevent bacterial vaginosis, it is best to have sex with just   one partner (monogamous).  Make sure you and your sexual partner are tested for STIs.  Wear cotton or cotton-lined underwear.  Avoid wearing tight pants and pantyhose, especially during summer.  Limit the amount of alcohol that you drink.  Do not use any products that  contain nicotine or tobacco, such as cigarettes and e-cigarettes. If you need help quitting, ask your health care provider.  Do not use illegal drugs. Where to find more information:  Centers for Disease Control and Prevention: www.cdc.gov/std  American Sexual Health Association (ASHA): www.ashastd.org  U.S. Department of Health and Human Services, Office on Women's Health: www.womenshealth.gov/ or https://www.womenshealth.gov/a-z-topics/bacterial-vaginosis Contact a health care provider if:  Your symptoms do not improve, even after treatment.  You have more discharge or pain when urinating.  You have a fever.  You have pain in your abdomen.  You have pain during sex.  You have vaginal bleeding between periods. Summary  Bacterial vaginosis is a vaginal infection that occurs when the normal balance of bacteria in the vagina is disrupted.  Because bacterial vaginosis increases your risk for STIs (sexually transmitted infections), getting treated can help reduce your risk for chlamydia, gonorrhea, herpes, and HIV (human immunodeficiency virus). Treatment is also important for preventing complications in pregnant women, because the condition can cause an early (premature) delivery.  This condition is treated with antibiotic medicines. These may be given as a pill, a vaginal cream, or a medicine that is put into the vagina (suppository). This information is not intended to replace advice given to you by your health care provider. Make sure you discuss any questions you have with your health care provider. Document Released: 10/06/2005 Document Revised: 06/21/2016 Document Reviewed: 06/21/2016 Elsevier Interactive Patient Education  2017 Elsevier Inc.  

## 2017-06-02 LAB — GC/CHLAMYDIA PROBE AMP
CT Probe RNA: NOT DETECTED
GC PROBE AMP APTIMA: NOT DETECTED

## 2017-06-04 ENCOUNTER — Other Ambulatory Visit: Payer: Self-pay | Admitting: Women's Health

## 2017-06-04 LAB — URINE CULTURE

## 2017-06-04 MED ORDER — NITROFURANTOIN MONOHYD MACRO 100 MG PO CAPS: 100.0000 mg | ORAL_CAPSULE | Freq: Two times a day (BID) | ORAL | 0 refills | Status: DC

## 2017-06-08 DIAGNOSIS — R269 Unspecified abnormalities of gait and mobility: Secondary | ICD-10-CM | POA: Diagnosis not present

## 2017-06-08 DIAGNOSIS — I5033 Acute on chronic diastolic (congestive) heart failure: Secondary | ICD-10-CM | POA: Diagnosis not present

## 2017-06-08 DIAGNOSIS — G4733 Obstructive sleep apnea (adult) (pediatric): Secondary | ICD-10-CM | POA: Diagnosis not present

## 2017-06-15 ENCOUNTER — Encounter (HOSPITAL_COMMUNITY): Payer: Self-pay

## 2017-06-15 ENCOUNTER — Encounter (HOSPITAL_COMMUNITY)
Admission: RE | Admit: 2017-06-15 | Discharge: 2017-06-15 | Disposition: A | Discharge status: Home / Self Care | Payer: Medicare HMO | Source: Ambulatory Visit | Attending: Surgery | Admitting: Surgery

## 2017-06-15 ENCOUNTER — Encounter: Payer: Medicare HMO | Admitting: Gynecology

## 2017-06-15 DIAGNOSIS — K8010 Calculus of gallbladder with chronic cholecystitis without obstruction: Secondary | ICD-10-CM | POA: Diagnosis present

## 2017-06-15 DIAGNOSIS — Z7982 Long term (current) use of aspirin: Secondary | ICD-10-CM | POA: Diagnosis not present

## 2017-06-15 DIAGNOSIS — J449 Chronic obstructive pulmonary disease, unspecified: Secondary | ICD-10-CM | POA: Diagnosis not present

## 2017-06-15 DIAGNOSIS — E109 Type 1 diabetes mellitus without complications: Secondary | ICD-10-CM | POA: Diagnosis not present

## 2017-06-15 DIAGNOSIS — G4733 Obstructive sleep apnea (adult) (pediatric): Secondary | ICD-10-CM | POA: Diagnosis not present

## 2017-06-15 DIAGNOSIS — Z79899 Other long term (current) drug therapy: Secondary | ICD-10-CM | POA: Diagnosis not present

## 2017-06-15 DIAGNOSIS — K760 Fatty (change of) liver, not elsewhere classified: Secondary | ICD-10-CM | POA: Diagnosis not present

## 2017-06-15 DIAGNOSIS — Z6841 Body Mass Index (BMI) 40.0 and over, adult: Secondary | ICD-10-CM | POA: Diagnosis not present

## 2017-06-15 DIAGNOSIS — Z7984 Long term (current) use of oral hypoglycemic drugs: Secondary | ICD-10-CM | POA: Diagnosis not present

## 2017-06-15 DIAGNOSIS — Z8673 Personal history of transient ischemic attack (TIA), and cerebral infarction without residual deficits: Secondary | ICD-10-CM | POA: Diagnosis not present

## 2017-06-15 DIAGNOSIS — I110 Hypertensive heart disease with heart failure: Secondary | ICD-10-CM | POA: Diagnosis not present

## 2017-06-15 DIAGNOSIS — K811 Chronic cholecystitis: Secondary | ICD-10-CM | POA: Diagnosis not present

## 2017-06-15 DIAGNOSIS — K828 Other specified diseases of gallbladder: Secondary | ICD-10-CM | POA: Diagnosis not present

## 2017-06-15 DIAGNOSIS — I509 Heart failure, unspecified: Secondary | ICD-10-CM | POA: Diagnosis not present

## 2017-06-15 LAB — BASIC METABOLIC PANEL
ANION GAP: 12 (ref 5–15)
BUN: 14 mg/dL (ref 6–20)
CALCIUM: 9.5 mg/dL (ref 8.9–10.3)
CO2: 26 mmol/L (ref 22–32)
Chloride: 100 mmol/L — ABNORMAL LOW (ref 101–111)
Creatinine, Ser: 1.28 mg/dL — ABNORMAL HIGH (ref 0.44–1.00)
GFR, EST AFRICAN AMERICAN: 54 mL/min — AB (ref >60)
GFR, EST NON AFRICAN AMERICAN: 47 mL/min — AB (ref >60)
Glucose, Bld: 144 mg/dL — ABNORMAL HIGH (ref 65–99)
Potassium: 3.2 mmol/L — ABNORMAL LOW (ref 3.5–5.1)
SODIUM: 138 mmol/L (ref 135–145)

## 2017-06-15 LAB — GLUCOSE, CAPILLARY: Glucose-Capillary: 201 mg/dL — ABNORMAL HIGH (ref 65–99)

## 2017-06-15 LAB — CBC
HCT: 44.7 % (ref 36.0–46.0)
HEMOGLOBIN: 14.2 g/dL (ref 12.0–15.0)
MCH: 26.9 pg (ref 26.0–34.0)
MCHC: 31.8 g/dL (ref 30.0–36.0)
MCV: 84.7 fL (ref 78.0–100.0)
PLATELETS: 223 10*3/uL (ref 150–400)
RBC: 5.28 MIL/uL — AB (ref 3.87–5.11)
RDW: 14.6 % (ref 11.5–15.5)
WBC: 10.5 10*3/uL (ref 4.0–10.5)

## 2017-06-15 LAB — HCG, SERUM, QUALITATIVE: Preg, Serum: NEGATIVE

## 2017-06-15 LAB — HEMOGLOBIN A1C
HEMOGLOBIN A1C: 7.2 % — AB (ref 4.8–5.6)
MEAN PLASMA GLUCOSE: 159.94 mg/dL

## 2017-06-15 NOTE — Progress Notes (Signed)
Anesthesia Chart Review:  Pt is a 56 year old female scheduled for laparoscopic cholecystectomy with intraoperative cholangiogram on 06/17/2017 with Donnie Mesa, MD  - Receives primary care at the [facility] - Cardiologist is Candee Furbish, MD  PMH includes:  CHF, stroke, HTN, DM, heart murmur, OSA, COPD, fatty liver.  Hx crack cocaine use, last in 2015. Current smoker. BMI 53  Medications include: Albuterol, ASA 81 mg, Lipitor, lisinopril, metformin, dulera, pindolol, potassium, Spiriva, torsemide  Preoperative labs reviewed.  HbA1c 7.2, glucose 144.   CXR 12/02/16:  - Low lung volumes with mild basilar atelectasis. Mild bibasilar infiltrates cannot be excluded.  EKG 02/17/17: NSR. Nonspecific ST changes.   Echo 10/23/16:  - Left ventricle: The cavity size is small. Wall thickness was increased in a pattern of severe LVH. Systolic function was vigorous. The estimated ejection fraction was 75%. Wall motion was normal; there were no regional wall motion abnormalities. Doppler parameters are consistent with abnormal left ventricular relaxation (grade 1 diastolic dysfunction). The E/e&' ratio is >15, suggesting elevated LV filling pressure. - Left atrium: The atrium was normal in size. - Inferior vena cava: The vessel was normal in size. The respirophasic diameter changes were in the normal range (>= 50%), consistent with normal central venous pressure. - Impressions: Compard to a prior study in 2015, the LVEF is higher at 75%. There is now severe LV wall thickening, diastolic dysfunction and elevated LV filling pressure.  R and L cardiac cath 03/31/14:  1. Normal left main coronary artery. 2. Minimal disease (luminal irregularities) of left anterior descending artery and normal branches. 3. Normal left circumflex artery and its branches. 4. Normal right coronary artery. 5. Normal left ventricular systolic function by echocardiogram.  LVEDP 19 mmHg.  Ej. fraction  65-70  %. 6. Normal/Elevated PA and wedge pressures.  If no changes, I anticipate pt can proceed with surgery as scheduled.   Willeen Cass, FNP-BC [facility]/Anesthesiology Phone: [phone] 06/15/2017 3:20 PM

## 2017-06-15 NOTE — Progress Notes (Signed)
PCP: Dr. [name] @ [place] [facility] Cardiologist: Dr. Marlou Porch  Pt. Doesn't check sugars at home,states she doesn't know how. Instructed her to go to pharmacy where she bought cbg meter and have them to review it with her. Stated she didn't think she had the right strips for the meter.

## 2017-06-15 NOTE — Pre-Procedure Instructions (Signed)
Hailey Barnes  06/15/2017      [id number] [address], [place] [address] Phone: [phone] Fax: [phone]  [id number] [id number]: [phone]: [phone] [place] [place]Phone: [phone]: [phone]   Your procedure is scheduled on Wed. Aug. 29  [facility]  Call this number if you have problems the morning of surgery:  [phone]   Remember:  Do not eat food or drink liquids after midnight on Aug. 28   Take these medicines the morning of surgery with A SIP OF WATER : tylenol if needed, albuterol and dulera and spiriva if needed-bring to hospital, macrobid, pindolol (visken)             Stop aspirin, advil, motrin, ibuprofen, aleve, Bc Powders, Goody's, vitamins and herbal medicines.    How to Manage Your Diabetes Before and After Surgery  Why is it important to control my blood sugar before and after surgery? . Improving blood sugar levels before and after surgery helps healing and can limit problems. . A way of improving blood sugar control is eating a healthy diet by: o  Eating less sugar and carbohydrates o  Increasing activity/exercise o  Talking with your doctor about reaching your blood sugar goals . High blood sugars (greater than 180 mg/dL) can raise your risk of infections and slow your recovery, so you will need to focus on controlling your diabetes during the weeks before surgery. . Make sure that the doctor who takes care of your diabetes knows about your planned surgery including the date and location.  How do I manage my blood sugar before surgery? . Check your blood sugar at least 4 times a day, starting 2 days  before surgery, to make sure that the level is not too high or low. o Check your blood sugar the morning of your surgery when you wake up and every 2 hours until you get to the Short Stay unit. . If your blood sugar is less than 70 mg/dL, you will need to treat for low blood sugar: o Do not take insulin. o Treat a low blood sugar (less than 70 mg/dL) with  cup of clear juice (cranberry or apple), 4 glucose tablets, OR glucose gel. o Recheck blood sugar in 15 minutes after treatment (to make sure it is greater than 70 mg/dL). If your blood sugar is not greater than 70 mg/dL on recheck, call [phone] further instructions. . Report your blood sugar to the short stay nurse when you get to Short Stay.  . If you are admitted to the hospital after surgery: o Your blood sugar will be checked by the staff and you will probably be given insulin after surgery (instead of oral diabetes medicines) to make sure you have good blood sugar levels. o The goal for blood sugar control after surgery is 80-180 mg/dL.              WHAT DO I DO ABOUT MY DIABETES MEDICATION?   .Marland KitchenDo not take oral diabetes medicines (pills) the morning of surgery.     Do not wear jewelry, make-up or nail polish.  Do not wear lotions, powders, or perfumes, or deoderant.  Do not shave 48 hours prior to surgery.  Men may shave face and neck.  Do not bring valuables to the hospital.  [facility] is not responsible for any belongings or valuables.  Contacts, dentures or bridgework may not be worn into surgery.  Leave your suitcase in the car.  After surgery it may be brought to your room.  For patients admitted to the hospital, discharge time will be determined by your treatment team.  Patients discharged the day of surgery will not be allowed to drive home.   Special instructions:  [place]- Preparing For Surgery  Before surgery, you can play an important role. Because skin is not sterile, your skin needs  to be as free of germs as possible. You can reduce the number of germs on your skin by washing with CHG (chlorahexidine gluconate) Soap before surgery.  CHG is an antiseptic cleaner which kills germs and bonds with the skin to continue killing germs even after washing.  Please do not use if you have an allergy to CHG or antibacterial soaps. If your skin becomes reddened/irritated stop using the CHG.  Do not shave (including legs and underarms) for at least 48 hours prior to first CHG shower. It is OK to shave your face.  Please follow these instructions carefully.   1. Shower the NIGHT BEFORE SURGERY and the MORNING OF SURGERY with CHG.   2. If you chose to wash your hair, wash your hair first as usual with your normal shampoo.  3. After you shampoo, rinse your hair and body thoroughly to remove the shampoo.  4. Use CHG as you would any other liquid soap. You can apply CHG directly to the skin and wash gently with a scrungie or a clean washcloth.   5. Apply the CHG Soap to your body ONLY FROM THE NECK DOWN.  Do not use on open wounds or open sores. Avoid contact with your eyes, ears, mouth and genitals (private parts). Wash genitals (private parts) with your normal soap.  6. Wash thoroughly, paying special attention to the area where your surgery will be performed.  7. Thoroughly rinse your body with warm water from the neck down.  8. DO NOT shower/wash with your normal soap after using and rinsing off the CHG Soap.  9. Pat yourself dry with a CLEAN TOWEL.   10. Wear CLEAN PAJAMAS   11. Place CLEAN SHEETS on your bed the night of your first shower and DO NOT SLEEP WITH PETS.    Day of Surgery: Do not apply any deodorants/lotions. Please wear clean clothes to the hospital/surgery center.      Please read over the following fact sheets that you were given. Coughing and Deep Breathing and Surgical Site Infection Prevention

## 2017-06-16 MED ORDER — CEFAZOLIN SODIUM 10 G IJ SOLR
3.0000 g | INTRAMUSCULAR | Status: AC
  Administered 2017-06-17: 3 g via INTRAVENOUS
  Filled 2017-06-16: qty 3000

## 2017-06-16 NOTE — Progress Notes (Signed)
Pt is aware of the new surgery and arrival time. Will be here at 0530.

## 2017-06-16 NOTE — Anesthesia Preprocedure Evaluation (Signed)
Anesthesia Evaluation  Patient identified by MRN, date of birth, ID band Patient awake    Reviewed: Allergy & Precautions, NPO status , Patient's Chart, lab work & pertinent test results  Airway Mallampati: III  TM Distance: >3 FB Neck ROM: Full    Dental  (+) Teeth Intact, Dental Advisory Given   Pulmonary sleep apnea , COPD,  COPD inhaler, Current Smoker,    Pulmonary exam normal breath sounds clear to auscultation       Cardiovascular hypertension, Pt. on home beta blockers and Pt. on medications +CHF (diastolic dysfunction)  Normal cardiovascular exam Rhythm:Regular Rate:Normal  Echo 10/23/16: Study Conclusions  - Left ventricle: The cavity size is small. Wall thickness was   increased in a pattern of severe LVH. Systolic function was   vigorous. The estimated ejection fraction was 75%. Wall motion   was normal; there were no regional wall motion abnormalities.   Doppler parameters are consistent with abnormal left ventricular   relaxation (grade 1 diastolic dysfunction). The E/e&' ratio is   >15, suggesting elevated LV filling pressure. - Left atrium: The atrium was normal in size. - Inferior vena cava: The vessel was normal in size. The   respirophasic diameter changes were in the normal range (>= 50%),   consistent with normal central venous pressure.  Impressions:  - Compard to a prior study in 2015, the LVEF is higher at 75%.   There is now severe LV wall thickening, diastolic dysfunction and   elevated LV filling pressure.   Neuro/Psych  Headaches, PSYCHIATRIC DISORDERS Anxiety Depression  Neuromuscular disease (Right leg nerve pain) CVA, Residual Symptoms    GI/Hepatic GERD  Medicated,(+)     substance abuse  cocaine use,   Endo/Other  diabetes, Type 2, Oral Hypoglycemic Agents  Renal/GU negative Renal ROS     Musculoskeletal  (+) Arthritis ,   Abdominal   Peds  Hematology negative hematology  ROS (+)   Anesthesia Other Findings Day of surgery medications reviewed with the patient.  Reproductive/Obstetrics                             Anesthesia Physical  Anesthesia Plan  ASA: III  Anesthesia Plan: General   Post-op Pain Management:    Induction: Intravenous  PONV Risk Score and Plan: 3 and Ondansetron, Dexamethasone, Propofol and Midazolam  Airway Management Planned: Oral ETT and Video Laryngoscope Planned  Additional Equipment:   Intra-op Plan:   Post-operative Plan: Extubation in OR  Informed Consent: I have reviewed the patients History and Physical, chart, labs and discussed the procedure including the risks, benefits and alternatives for the proposed anesthesia with the patient or authorized representative who has indicated his/her understanding and acceptance.   Dental advisory given  Plan Discussed with: CRNA  Anesthesia Plan Comments: (Risks/benefits of general anesthesia discussed with patient including risk of damage to teeth, lips, gum, and tongue, nausea/vomiting, allergic reactions to medications, and the possibility of heart attack, stroke and death.  All patient questions answered.  Patient wishes to proceed.)        Anesthesia Quick Evaluation

## 2017-06-17 ENCOUNTER — Ambulatory Visit (HOSPITAL_COMMUNITY): Payer: Medicare HMO | Admitting: Emergency Medicine

## 2017-06-17 ENCOUNTER — Encounter (HOSPITAL_COMMUNITY): Payer: Self-pay | Admitting: Certified Registered"

## 2017-06-17 ENCOUNTER — Ambulatory Visit (HOSPITAL_COMMUNITY): Payer: Medicare HMO

## 2017-06-17 ENCOUNTER — Encounter (HOSPITAL_COMMUNITY)
Admission: RE | Disposition: A | Discharge status: Home / Self Care | Payer: Self-pay | Source: Ambulatory Visit | Attending: Surgery

## 2017-06-17 ENCOUNTER — Ambulatory Visit (HOSPITAL_COMMUNITY): Payer: Medicare HMO | Admitting: Certified Registered Nurse Anesthetist

## 2017-06-17 ENCOUNTER — Ambulatory Visit (HOSPITAL_COMMUNITY)
Admission: RE | Admit: 2017-06-17 | Discharge: 2017-06-17 | Disposition: A | Discharge status: Home / Self Care | Payer: Medicare HMO | Source: Ambulatory Visit | Attending: Surgery | Admitting: Surgery

## 2017-06-17 DIAGNOSIS — F329 Major depressive disorder, single episode, unspecified: Secondary | ICD-10-CM | POA: Diagnosis not present

## 2017-06-17 DIAGNOSIS — E109 Type 1 diabetes mellitus without complications: Secondary | ICD-10-CM | POA: Diagnosis not present

## 2017-06-17 DIAGNOSIS — Z8673 Personal history of transient ischemic attack (TIA), and cerebral infarction without residual deficits: Secondary | ICD-10-CM | POA: Insufficient documentation

## 2017-06-17 DIAGNOSIS — E785 Hyperlipidemia, unspecified: Secondary | ICD-10-CM | POA: Diagnosis not present

## 2017-06-17 DIAGNOSIS — Z7984 Long term (current) use of oral hypoglycemic drugs: Secondary | ICD-10-CM | POA: Diagnosis not present

## 2017-06-17 DIAGNOSIS — Z7982 Long term (current) use of aspirin: Secondary | ICD-10-CM | POA: Insufficient documentation

## 2017-06-17 DIAGNOSIS — K811 Chronic cholecystitis: Secondary | ICD-10-CM | POA: Insufficient documentation

## 2017-06-17 DIAGNOSIS — J449 Chronic obstructive pulmonary disease, unspecified: Secondary | ICD-10-CM | POA: Diagnosis not present

## 2017-06-17 DIAGNOSIS — I509 Heart failure, unspecified: Secondary | ICD-10-CM | POA: Insufficient documentation

## 2017-06-17 DIAGNOSIS — K760 Fatty (change of) liver, not elsewhere classified: Secondary | ICD-10-CM | POA: Insufficient documentation

## 2017-06-17 DIAGNOSIS — G4733 Obstructive sleep apnea (adult) (pediatric): Secondary | ICD-10-CM | POA: Insufficient documentation

## 2017-06-17 DIAGNOSIS — Z79899 Other long term (current) drug therapy: Secondary | ICD-10-CM | POA: Insufficient documentation

## 2017-06-17 DIAGNOSIS — I110 Hypertensive heart disease with heart failure: Secondary | ICD-10-CM | POA: Insufficient documentation

## 2017-06-17 DIAGNOSIS — K828 Other specified diseases of gallbladder: Secondary | ICD-10-CM | POA: Insufficient documentation

## 2017-06-17 DIAGNOSIS — K824 Cholesterolosis of gallbladder: Secondary | ICD-10-CM | POA: Diagnosis not present

## 2017-06-17 DIAGNOSIS — Z6841 Body Mass Index (BMI) 40.0 and over, adult: Secondary | ICD-10-CM | POA: Diagnosis not present

## 2017-06-17 DIAGNOSIS — Z419 Encounter for procedure for purposes other than remedying health state, unspecified: Secondary | ICD-10-CM

## 2017-06-17 HISTORY — PX: CHOLECYSTECTOMY: SHX55

## 2017-06-17 LAB — GLUCOSE, CAPILLARY
GLUCOSE-CAPILLARY: 150 mg/dL — AB (ref 65–99)
Glucose-Capillary: 191 mg/dL — ABNORMAL HIGH (ref 65–99)

## 2017-06-17 SURGERY — LAPAROSCOPIC CHOLECYSTECTOMY WITH INTRAOPERATIVE CHOLANGIOGRAM
Anesthesia: General | Site: Abdomen

## 2017-06-17 MED ORDER — ROCURONIUM BROMIDE 100 MG/10ML IV SOLN
INTRAVENOUS | Status: DC | PRN
  Administered 2017-06-17: 10 mg via INTRAVENOUS
  Administered 2017-06-17: 50 mg via INTRAVENOUS

## 2017-06-17 MED ORDER — LABETALOL HCL 5 MG/ML IV SOLN
10.0000 mg | INTRAVENOUS | Status: DC | PRN
  Administered 2017-06-17: 10 mg via INTRAVENOUS

## 2017-06-17 MED ORDER — BUPIVACAINE-EPINEPHRINE 0.25% -1:200000 IJ SOLN
INTRAMUSCULAR | Status: DC | PRN
  Administered 2017-06-17: 30 mL

## 2017-06-17 MED ORDER — MIDAZOLAM HCL 2 MG/2ML IJ SOLN
INTRAMUSCULAR | Status: AC
  Filled 2017-06-17: qty 2

## 2017-06-17 MED ORDER — SODIUM CHLORIDE 0.9 % IR SOLN
Status: DC | PRN
  Administered 2017-06-17: 1000 mL

## 2017-06-17 MED ORDER — FENTANYL CITRATE (PF) 100 MCG/2ML IJ SOLN
25.0000 ug | INTRAMUSCULAR | Status: DC | PRN
  Administered 2017-06-17 (×4): 25 ug via INTRAVENOUS

## 2017-06-17 MED ORDER — SUCCINYLCHOLINE CHLORIDE 20 MG/ML IJ SOLN
INTRAMUSCULAR | Status: DC | PRN
  Administered 2017-06-17: 150 mg via INTRAVENOUS

## 2017-06-17 MED ORDER — FENTANYL CITRATE (PF) 250 MCG/5ML IJ SOLN
INTRAMUSCULAR | Status: AC
  Filled 2017-06-17: qty 5

## 2017-06-17 MED ORDER — BUPIVACAINE-EPINEPHRINE (PF) 0.25% -1:200000 IJ SOLN
INTRAMUSCULAR | Status: AC
  Filled 2017-06-17: qty 30

## 2017-06-17 MED ORDER — PROPOFOL 10 MG/ML IV BOLUS
INTRAVENOUS | Status: DC | PRN
  Administered 2017-06-17: 200 mg via INTRAVENOUS

## 2017-06-17 MED ORDER — SODIUM CHLORIDE 0.9 % IV SOLN
INTRAVENOUS | Status: DC | PRN
  Administered 2017-06-17: 100 mL

## 2017-06-17 MED ORDER — FENTANYL CITRATE (PF) 100 MCG/2ML IJ SOLN
INTRAMUSCULAR | Status: AC
  Filled 2017-06-17: qty 2

## 2017-06-17 MED ORDER — MIDAZOLAM HCL 5 MG/5ML IJ SOLN
INTRAMUSCULAR | Status: DC | PRN
  Administered 2017-06-17: 2 mg via INTRAVENOUS

## 2017-06-17 MED ORDER — IOPAMIDOL (ISOVUE-300) INJECTION 61%
INTRAVENOUS | Status: AC
  Filled 2017-06-17: qty 50

## 2017-06-17 MED ORDER — OXYCODONE HCL 5 MG PO TABS: 5.0000 mg | ORAL_TABLET | ORAL | 0 refills | Status: DC | PRN

## 2017-06-17 MED ORDER — LABETALOL HCL 5 MG/ML IV SOLN
INTRAVENOUS | Status: AC
  Administered 2017-06-17: 10 mg
  Filled 2017-06-17: qty 4

## 2017-06-17 MED ORDER — LIDOCAINE HCL (CARDIAC) 20 MG/ML IV SOLN
INTRAVENOUS | Status: DC | PRN
  Administered 2017-06-17: 100 mg via INTRAVENOUS

## 2017-06-17 MED ORDER — 0.9 % SODIUM CHLORIDE (POUR BTL) OPTIME
TOPICAL | Status: DC | PRN
  Administered 2017-06-17: 1000 mL

## 2017-06-17 MED ORDER — LACTATED RINGERS IV SOLN
INTRAVENOUS | Status: DC | PRN
  Administered 2017-06-17 (×2): via INTRAVENOUS

## 2017-06-17 MED ORDER — CHLORHEXIDINE GLUCONATE CLOTH 2 % EX PADS: 6.0000 | MEDICATED_PAD | Freq: Once | CUTANEOUS | Status: DC

## 2017-06-17 MED ORDER — SUGAMMADEX SODIUM 200 MG/2ML IV SOLN
INTRAVENOUS | Status: DC | PRN
  Administered 2017-06-17: 200 mg via INTRAVENOUS

## 2017-06-17 MED ORDER — ONDANSETRON HCL 4 MG/2ML IJ SOLN
INTRAMUSCULAR | Status: DC | PRN
  Administered 2017-06-17: 4 mg via INTRAVENOUS

## 2017-06-17 MED ORDER — FENTANYL CITRATE (PF) 100 MCG/2ML IJ SOLN
INTRAMUSCULAR | Status: DC | PRN
  Administered 2017-06-17 (×3): 50 ug via INTRAVENOUS
  Administered 2017-06-17: 100 ug via INTRAVENOUS

## 2017-06-17 MED ORDER — PROPOFOL 10 MG/ML IV BOLUS
INTRAVENOUS | Status: AC
  Filled 2017-06-17: qty 20

## 2017-06-17 SURGICAL SUPPLY — 47 items
APPLIER CLIP ROT 10 11.4 M/L (STAPLE) ×3
BENZOIN TINCTURE PRP APPL 2/3 (GAUZE/BANDAGES/DRESSINGS) ×3 IMPLANT
BLADE CLIPPER SURG (BLADE) IMPLANT
CANISTER SUCT 3000ML PPV (MISCELLANEOUS) ×3 IMPLANT
CHLORAPREP W/TINT 26ML (MISCELLANEOUS) ×3 IMPLANT
CLIP APPLIE ROT 10 11.4 M/L (STAPLE) ×1 IMPLANT
CLOSURE WOUND 1/2 X4 (GAUZE/BANDAGES/DRESSINGS) ×1
COVER MAYO STAND STRL (DRAPES) ×3 IMPLANT
COVER SURGICAL LIGHT HANDLE (MISCELLANEOUS) ×3 IMPLANT
DECANTER SPIKE VIAL GLASS SM (MISCELLANEOUS) ×3 IMPLANT
DRAPE C-ARM 42X72 X-RAY (DRAPES) ×3 IMPLANT
DRSG TEGADERM 2-3/8X2-3/4 SM (GAUZE/BANDAGES/DRESSINGS) ×9 IMPLANT
DRSG TEGADERM 4X4.75 (GAUZE/BANDAGES/DRESSINGS) ×3 IMPLANT
ELECT REM PT RETURN 9FT ADLT (ELECTROSURGICAL) ×3
ELECTRODE REM PT RTRN 9FT ADLT (ELECTROSURGICAL) ×1 IMPLANT
FILTER SMOKE EVAC LAPAROSHD (FILTER) ×3 IMPLANT
GAUZE SPONGE 2X2 8PLY STRL LF (GAUZE/BANDAGES/DRESSINGS) ×1 IMPLANT
GLOVE BIO SURGEON STRL SZ7 (GLOVE) ×6 IMPLANT
GLOVE BIOGEL PI IND STRL 6.5 (GLOVE) ×1 IMPLANT
GLOVE BIOGEL PI IND STRL 7.5 (GLOVE) ×1 IMPLANT
GLOVE BIOGEL PI IND STRL 8 (GLOVE) ×1 IMPLANT
GLOVE BIOGEL PI INDICATOR 6.5 (GLOVE) ×2
GLOVE BIOGEL PI INDICATOR 7.5 (GLOVE) ×2
GLOVE BIOGEL PI INDICATOR 8 (GLOVE) ×2
GLOVE ECLIPSE 6.0 STRL STRAW (GLOVE) ×3 IMPLANT
GOWN STRL REUS W/ TWL LRG LVL3 (GOWN DISPOSABLE) ×3 IMPLANT
GOWN STRL REUS W/TWL LRG LVL3 (GOWN DISPOSABLE) ×6
KIT BASIN OR (CUSTOM PROCEDURE TRAY) ×3 IMPLANT
KIT ROOM TURNOVER OR (KITS) ×3 IMPLANT
NS IRRIG 1000ML POUR BTL (IV SOLUTION) ×3 IMPLANT
PAD ARMBOARD 7.5X6 YLW CONV (MISCELLANEOUS) ×3 IMPLANT
POUCH SPECIMEN RETRIEVAL 10MM (ENDOMECHANICALS) ×3 IMPLANT
SCISSORS LAP 5X35 DISP (ENDOMECHANICALS) ×3 IMPLANT
SET CHOLANGIOGRAPH 5 50 .035 (SET/KITS/TRAYS/PACK) ×3 IMPLANT
SET IRRIG TUBING LAPAROSCOPIC (IRRIGATION / IRRIGATOR) ×3 IMPLANT
SLEEVE ENDOPATH XCEL 5M (ENDOMECHANICALS) ×3 IMPLANT
SPECIMEN JAR SMALL (MISCELLANEOUS) ×3 IMPLANT
SPONGE GAUZE 2X2 STER 10/PKG (GAUZE/BANDAGES/DRESSINGS) ×2
STRIP CLOSURE SKIN 1/2X4 (GAUZE/BANDAGES/DRESSINGS) ×2 IMPLANT
SUT MNCRL AB 4-0 PS2 18 (SUTURE) ×3 IMPLANT
TOWEL OR 17X24 6PK STRL BLUE (TOWEL DISPOSABLE) ×3 IMPLANT
TOWEL OR 17X26 10 PK STRL BLUE (TOWEL DISPOSABLE) ×3 IMPLANT
TRAY LAPAROSCOPIC MC (CUSTOM PROCEDURE TRAY) ×3 IMPLANT
TROCAR XCEL BLUNT TIP 100MML (ENDOMECHANICALS) ×3 IMPLANT
TROCAR XCEL NON-BLD 11X100MML (ENDOMECHANICALS) ×3 IMPLANT
TROCAR XCEL NON-BLD 5MMX100MML (ENDOMECHANICALS) ×3 IMPLANT
TUBING INSUFFLATION (TUBING) ×3 IMPLANT

## 2017-06-17 NOTE — Transfer of Care (Signed)
Immediate Anesthesia Transfer of Care Note  Patient: Hailey Barnes  Procedure(s) Performed: Procedure(s): LAPAROSCOPIC CHOLECYSTECTOMY WITH INTRAOPERATIVE CHOLANGIOGRAM (N/A)  Patient Location: PACU  Anesthesia Type:General  Level of Consciousness: awake and alert   Airway & Oxygen Therapy: Patient Spontanous Breathing and Patient connected to face mask oxygen  Post-op Assessment: Report given to RN and Post -op Vital signs reviewed and stable  Post vital signs: Reviewed and stable  Last Vitals:  Vitals:   06/17/17 0611 06/17/17 0904  BP: (!) 142/88 (!) 153/100  Pulse: 78 85  Resp: 18 20  Temp: 36.6 C (!) 36.2 C  SpO2: 98% 97%    Last Pain: There were no vitals filed for this visit.       Complications: No apparent anesthesia complications

## 2017-06-17 NOTE — Op Note (Signed)
Laparoscopic Cholecystectomy with IOC Procedure Note  Indications: This patient presents with symptomatic gallbladder disease and will undergo laparoscopic cholecystectomy.  Pre-operative Diagnosis: Calculus of gallbladder with other cholecystitis, without mention of obstruction  Post-operative Diagnosis: Same  Surgeon: Anan Dapolito K.   Assistants: none  Anesthesia: General endotracheal anesthesia  ASA Class: 2  Procedure Details  The patient was seen again in the Holding Room. The risks, benefits, complications, treatment options, and expected outcomes were discussed with the patient. The possibilities of reaction to medication, pulmonary aspiration, perforation of viscus, bleeding, recurrent infection, finding a normal gallbladder, the need for additional procedures, failure to diagnose a condition, the possible need to convert to an open procedure, and creating a complication requiring transfusion or operation were discussed with the patient. The likelihood of improving the patient's symptoms with return to their baseline status is good.  The patient and/or family concurred with the proposed plan, giving informed consent. The site of surgery properly noted. The patient was taken to Operating Room, identified as Hailey Barnes and the procedure verified as Laparoscopic Cholecystectomy with Intraoperative Cholangiogram. A Time Out was held and the above information confirmed.  Prior to the induction of general anesthesia, antibiotic prophylaxis was administered. General endotracheal anesthesia was then administered and tolerated well. After the induction, the abdomen was prepped with Chloraprep and draped in the sterile fashion. The patient was positioned in the supine position.  Local anesthetic agent was injected into the skin above the umbilicus and a vertical incision made. We dissected down through the thick layer of adipose tissue to the abdominal fascia with blunt dissection.  The  fascia was incised vertically and we entered the peritoneal cavity bluntly.  A pursestring suture of 0-Vicryl was placed around the fascial opening.  The Hasson cannula was inserted and secured with the stay suture.  Pneumoperitoneum was then created with CO2 and tolerated well without any adverse changes in the patient's vital signs. An 11-mm port was placed in the subxiphoid position.  Two 5-mm ports were placed in the right upper quadrant. All skin incisions were infiltrated with a local anesthetic agent before making the incision and placing the trocars.   We positioned the patient in reverse Trendelenburg, tilted slightly to the patient's left.  The gallbladder was identified, the fundus grasped and retracted cephalad. Adhesions were lysed bluntly and with the electrocautery where indicated, taking care not to injure any adjacent organs or viscus. The infundibulum was grasped and retracted laterally, exposing the peritoneum overlying the triangle of Calot. This was then divided and exposed in a blunt fashion. A critical view of the cystic duct and cystic artery was obtained.  The cystic duct was clearly identified and bluntly dissected circumferentially. The cystic duct was ligated with a clip distally.   An incision was made in the cystic duct and the [facility] cholangiogram catheter introduced. The catheter was secured using a clip. A cholangiogram was then obtained which showed good visualization of the distal and proximal biliary tree with no sign of filling defects or obstruction.  Contrast flowed easily into the duodenum. The catheter was then removed.   The cystic duct was then ligated with clips and divided. The cystic artery was identified, dissected free, ligated with clips and divided as well.   The gallbladder was dissected from the liver bed in retrograde fashion with the electrocautery.  A small amount of bile was spilled, but no stones were seen.  Dissection was difficult because of her obesity  and the difficulty with  lifting her liver. The gallbladder was removed and placed in an Endocatch sac. The liver bed was irrigated and inspected. Hemostasis was achieved with the electrocautery. Copious irrigation was utilized and was repeatedly aspirated until clear.  The gallbladder and Endocatch sac were then removed through the umbilical port site.  The pursestring suture was used to close the umbilical fascia.    We again inspected the right upper quadrant for hemostasis.  Pneumoperitoneum was released as we removed the trocars.  4-0 Monocryl was used to close the skin.   Benzoin, steri-strips, and clean dressings were applied. The patient was then extubated and brought to the recovery room in stable condition. Instrument, sponge, and needle counts were correct at closure and at the conclusion of the case.   Findings: Cholecystitis with Cholelithiasis  Estimated Blood Loss: Minimal         Drains: none         Specimens: Gallbladder           Complications: None; patient tolerated the procedure well.         Disposition: PACU - hemodynamically stable.         Condition: stable  Imogene Burn. Georgette Dover, MD, [facility]  General/ Trauma Surgery  06/17/2017 9:00 AM

## 2017-06-17 NOTE — H&P (View-Only) (Signed)
History of Present Illness Hailey Barnes. Hailey Lana MD; 05/29/2017 12:47 PM) The patient is a 56 year old female who presents with abdominal pain. Referred by Dr. Nicki Guadalajara for gallbladder disease Primary care physician Dr. Dimas Chyle This is a 56 year old female with morbid obesity, history of CVA, OSA, CHF, COPD, DM 1 who presents with a two-year history of intermittent right upper quadrant abdominal pain. This tends to be postprandial and seems to be worse with greasy foods. However recently, she has been experiencing symptoms even after drinking water. Her symptoms seem to be worsening. She does have some associated nausea but no vomiting. She has also experienced diarrhea. She was evaluated by GI who performed an EGD and colonoscopy. These did not show any obvious etiology for her symptoms. She had an ultrasound as well as HIDA scan that were both within normal limits. The most recent ultrasound did show a small gallbladder polyp. Because of her continuing symptoms as well as her new finding of a gallbladder polyp, she is now referred to discuss cholecystectomy.  CLINICAL DATA: RIGHT upper quadrant pain, nausea, and vomiting for 1 week, known fatty liver disease  EXAM: NUCLEAR MEDICINE HEPATOBILIARY IMAGING WITH GALLBLADDER EF  TECHNIQUE: Sequential images of the abdomen were obtained out to 60 minutes following intravenous administration of radiopharmaceutical. After oral ingestion of Ensure, gallbladder ejection fraction was determined. At 60 min, normal ejection fraction is greater than 33%.  RADIOPHARMACEUTICALS: 5.1 mCi Tc-27mCholetec IV  COMPARISON: None  FINDINGS: Patient motion artifacts throughout exam.  Normal tracer extraction from bloodstream indicating normal hepatocellular function.  Normal excretion of tracer into biliary tree.  Gallbladder visualized at 31 min.  Small bowel visualized at 42 min.  No hepatic retention of tracer.  Subjectively  normal emptying of tracer from gallbladder following fatty meal stimulation.  Calculated gallbladder ejection fraction is 89% , normal.  Patient reported no symptoms following Ensure ingestion.  Normal gallbladder ejection fraction following Ensure ingestion is greater than 33% at 1 hour.  IMPRESSION: Normal exam.   Electronically Signed By: [name] On: 12/04/2016 10:32  CLINICAL DATA: Right upper quadrant pain over the last 2 weeks.  EXAM: [place] LIMITED - RIGHT UPPER QUADRANT  COMPARISON: 12/02/2016  FINDINGS: Gallbladder:  Wall thickness is normal. No surrounding fluid. No Murphy sign. No shadowing stones. 8 mm gallbladder polyp demonstrated.  Common bile duct:  Diameter: 5 mm, normal  Liver:  Echogenic suggesting fatty change. No focal lesion or ductal dilatation.  IMPRESSION: Fatty liver.  Gallbladder polyp. No evidence of shadowing stone, gallbladder inflammation or biliary obstruction.   Electronically Signed By: [name] On: 03/20/2017 10:14    Past Surgical History ([name] R[place]ace 05/29/2017 10:04 AM) Cesarean Section - 1 Foot Surgery Bilateral.  Diagnostic Studies History ([name] 05/29/2017 10:04 AM) Colonoscopy within last year Mammogram 1-3 years ago Pap Smear 1-5 years ago  Allergies ([name] 05/29/2017 10:06 AM) No Known Drug Allergies 05/29/2017 Allergies Reconciled  Medication History ([name] [place] 05/29/2017 10:08 AM) Albuterol (90MCG/ACT Aerosol Soln, Inhalation) Active. Aspirin (81MG Tablet, Oral) Active. HydrOXYzine HCl (50MG Tablet, Oral) Active. Mometasone Furoate (100MCG/ACT Aerosol, Inhalation) Active. Lisinopril (20MG Tablet, Oral) Active. Medications Reconciled  Social History ([name] 05/29/2017 10:04 AM) Alcohol use Remotely quit alcohol use. Caffeine use Carbonated beverages, Coffee, Tea. Illicit drug use  Remotely quit drug use. Tobacco use Current every day smoker.  Family History ([name] [place] 05/29/2017 10:04 AM) Anesthetic  complications Daughter, Family Members In General. Colon Polyps Family Members In General. Depression Father. Diabetes Mellitus Father. Hypertension Brother, Daughter, Father, Mother, Sister. Respiratory Condition Sister.  Pregnancy / Birth History ([name], [name]; 05/29/2017 10:04 AM) Age at menarche 14 years. Age of menopause 82-55 Gravida 1 Irregular periods Maternal age 41-20 Para 1  Other Problems ([name], [place]; 05/29/2017 10:04 AM) Arthritis Back Pain Cerebrovascular Accident Cholelithiasis Chronic Obstructive Lung Disease Congestive Heart Failure Diabetes Mellitus General anesthesia - complications High blood pressure Sleep Apnea     Review of Systems ([name]; 05/29/2017 10:04 AM) General Present- Chills, Fatigue and Fever. Not Present- Appetite Loss, Night Sweats, Weight Gain and Weight Loss. HEENT Present- Wears glasses/contact lenses. Not Present- Earache, Hearing Loss, Hoarseness, Nose Bleed, Oral Ulcers, Ringing in the Ears, Seasonal Allergies, Sinus Pain, Sore Throat, Visual Disturbances and Yellow Eyes. Breast Not Present- Breast Mass, Breast Pain, Nipple Discharge and Skin Changes. Cardiovascular Present- Difficulty Breathing Lying Down, Shortness of Breath and Swelling of Extremities. Not Present- Chest Pain, Leg Cramps, Palpitations and Rapid Heart Rate. Gastrointestinal Present- Abdominal Pain and Nausea. Not Present- Bloating, Bloody Stool, Change in Bowel Habits, Chronic diarrhea, Constipation, Difficulty Swallowing, Excessive gas, Gets full quickly at meals, Hemorrhoids, Indigestion, Rectal Pain and Vomiting. Female Genitourinary Not Present- Frequency, Nocturia, Painful Urination, Pelvic Pain and Urgency. Musculoskeletal Present- Back Pain, Joint Pain, Joint Stiffness and  Swelling of Extremities. Not Present- Muscle Pain and Muscle Weakness. Neurological Present- Trouble walking. Not Present- Decreased Memory, Fainting, Headaches, Numbness, Seizures, Tingling, Tremor and Weakness. Psychiatric Not Present- Anxiety, Bipolar, Change in Sleep Pattern, Depression, Fearful and Frequent crying. Endocrine Not Present- Cold Intolerance, Excessive Hunger, Hair Changes, Heat Intolerance, Hot flashes and New Diabetes. Hematology Not Present- Blood Thinners, Easy Bruising, Excessive bleeding, Gland problems, HIV and Persistent Infections.  Vitals ([name]; 05/29/2017 10:06 AM) 05/29/2017 10:05 AM Weight: 292.4 lb Height: 62in Body Surface Area: 2.25 m Body Mass Index: 53.48 kg/m  Temp.: 98.61F  Pulse: 105 (Regular)  P.OX: 92% (Room air) BP: 158/98 (Sitting, Left Arm, Standard)      Physical Exam Rodman Key K. Mylz Yuan MD; 05/29/2017 12:48 PM)  The physical exam findings are as follows: Note:WDWN in NAD Eyes: Pupils equal, round; sclera anicteric HENT: Oral mucosa moist; good dentition Neck: No masses palpated, no thyromegaly Lungs: CTA bilaterally; normal respiratory effort CV: Regular rate and rhythm; no murmurs; extremities well-perfused with no edema Abd: +bowel sounds, obese, soft, tender in RUQ around to flank, no palpable organomegaly; no palpable hernias Skin: Warm, dry; no sign of jaundice Psychiatric - alert and oriented x 4; calm mood and affect    Assessment & Plan Rodman Key K. Jenevie Casstevens MD; 05/29/2017 12:48 PM)  CHRONIC CHOLECYSTITIS WITHOUT CALCULUS (K81.1)  Current Plans Schedule for Surgery - Laparoscopic cholecystectomy with intraoperative cholangiogram. The surgical procedure has been discussed with the patient. Potential risks, benefits, alternative treatments, and expected outcomes have been explained. All of the patient's questions at this time have been answered. The likelihood of reaching the patient's treatment goal is  good. The patient understand the proposed surgical procedure and wishes to proceed. Note:The patient is considering possible bariatric surgery but she does not want to wait several months to have her cholecystectomy. We will proceed with her cholecystectomy and then she will begin the process to be evaluated for bariatric surgery.  Hailey Barnes. Georgette Dover, MD, [facility]  General/ Trauma Surgery  05/29/2017 12:49 PM

## 2017-06-17 NOTE — Interval H&P Note (Signed)
History and Physical Interval Note:  06/17/2017 7:08 AM  Hailey Barnes  has presented today for surgery, with the diagnosis of Chronic cholecystitis  The various methods of treatment have been discussed with the patient and family. After consideration of risks, benefits and other options for treatment, the patient has consented to  Procedure(s): LAPAROSCOPIC CHOLECYSTECTOMY WITH INTRAOPERATIVE CHOLANGIOGRAM (N/A) as a surgical intervention .  The patient's history has been reviewed, patient examined, no change in status, stable for surgery.  I have reviewed the patient's chart and labs.  Questions were answered to the patient's satisfaction.     Dakia Schifano K.

## 2017-06-17 NOTE — Discharge Instructions (Signed)
[facility] LAPAROSCOPIC SURGERY: POST OP INSTRUCTIONS Always review your discharge instruction sheet given to you by the facility where your surgery was performed. IF YOU HAVE DISABILITY OR FAMILY LEAVE FORMS, YOU MUST BRING THEM TO THE OFFICE FOR PROCESSING.   DO NOT GIVE THEM TO YOUR DOCTOR.  1. A prescription for pain medication will be given to you upon discharge.  Take your pain medication as prescribed, if needed.  If narcotic pain medicine is not needed, then you may take acetaminophen (Tylenol) or ibuprofen (Advil) as needed. 2. Take your usually prescribed medications unless otherwise directed. 3. If you need a refill on your pain medication, please contact your pharmacy.  They will contact our office to request authorization. Prescriptions will not be filled after 5pm or on week-ends. 4. You should follow a light diet the first few days after arrival home, such as soup and crackers, etc.  Be sure to include lots of fluids daily. 5. Most patients will experience some swelling and bruising in the area of the incisions.  Ice packs will help.  Swelling and bruising can take several days to resolve.  6. It is common to experience some constipation if taking pain medication after surgery.  Increasing fluid intake and taking a stool softener (such as Colace) will usually help or prevent this problem from occurring.  A mild laxative (Milk of Magnesia or Miralax) should be taken according to package instructions if there are no bowel movements after 48 hours. 7. Unless discharge instructions indicate otherwise, you may remove your bandages 48 hours after surgery, and you may shower at that time.  You will have steri-strips (small skin tapes) in place directly over the incision.  These strips should be left on the skin for 7-10 days.  If your surgeon used skin glue on the incision, you may shower in 24 hours.  The glue will flake off over the next 2-3 weeks.  Any sutures or staples  will be removed at the office during your follow-up visit. 8. ACTIVITIES:  You may resume regular (light) daily activities beginning the next day--such as daily self-care, walking, climbing stairs--gradually increasing activities as tolerated.  You may have sexual intercourse when it is comfortable.  Refrain from any heavy lifting or straining until approved by your doctor. a. You may drive when you are no longer taking prescription pain medication, you can comfortably wear a seatbelt, and you can safely maneuver your car and apply brakes. b. RETURN TO WORK:   2-3 weeks 9. You should see your doctor in the office for a follow-up appointment approximately 2-3 weeks after your surgery.  Make sure that you call for this appointment within a day or two after you arrive home to insure a convenient appointment time. 10. OTHER INSTRUCTIONS: ________________________________________________________________________ WHEN TO CALL YOUR DOCTOR: 1. Fever over 101.0 2. Inability to urinate 3. Continued bleeding from incision. 4. Increased pain, redness, or drainage from the incision. 5. Increasing abdominal pain  The clinic staff is available to answer your questions during regular business hours.  Please dont hesitate to call and ask to speak to one of the nurses for clinical concerns.  If you have a medical emergency, go to the nearest emergency room or call 911.  A surgeon from [facility] is always on call at the hospital. [address] [place], [place], [place]  [address] ? [address], [place], [place]   [address] [phone] ? [phone] ? FAX [phone] Web site:  [email or web address]

## 2017-06-17 NOTE — Anesthesia Postprocedure Evaluation (Signed)
Anesthesia Post Note  Patient: Hailey Barnes  Procedure(s) Performed: Procedure(s) (LRB): LAPAROSCOPIC CHOLECYSTECTOMY WITH INTRAOPERATIVE CHOLANGIOGRAM (N/A)     Patient location during evaluation: PACU Anesthesia Type: General Level of consciousness: awake and alert Pain management: pain level controlled Vital Signs Assessment: post-procedure vital signs reviewed and stable Respiratory status: spontaneous breathing, nonlabored ventilation, respiratory function stable and patient connected to nasal cannula oxygen Cardiovascular status: blood pressure returned to baseline and stable Postop Assessment: no signs of nausea or vomiting Anesthetic complications: no    Last Vitals:  Vitals:   06/17/17 1055 06/17/17 1100  BP: (!) 174/96 (!) 170/104  Pulse: 71 75  Resp: 16 (!) 21  Temp:  (!) 36.1 C  SpO2: 100% 100%    Last Pain:  Vitals:   06/17/17 1100  PainSc: 3                  Hailey Barnes

## 2017-06-17 NOTE — Anesthesia Procedure Notes (Signed)
Procedure Name: Intubation Date/Time: 06/17/2017 7:29 AM Performed by: Manuela Schwartz B Pre-anesthesia Checklist: Patient identified, Emergency Drugs available, Suction available and Patient being monitored Patient Re-evaluated:Patient Re-evaluated prior to induction Oxygen Delivery Method: Circle System Utilized Preoxygenation: Pre-oxygenation with 100% oxygen Induction Type: IV induction and Rapid sequence Laryngoscope Size: Mac and 3 Grade View: Grade I Tube type: Oral Tube size: 7.0 mm Number of attempts: 1 Airway Equipment and Method: Stylet and Oral airway Placement Confirmation: ETT inserted through vocal cords under direct vision,  positive ETCO2 and breath sounds checked- equal and bilateral Secured at: 21 cm Tube secured with: Tape Dental Injury: Teeth and Oropharynx as per pre-operative assessment

## 2017-06-18 ENCOUNTER — Encounter (HOSPITAL_COMMUNITY): Payer: Self-pay | Admitting: Surgery

## 2017-06-27 DIAGNOSIS — R269 Unspecified abnormalities of gait and mobility: Secondary | ICD-10-CM | POA: Diagnosis not present

## 2017-06-27 DIAGNOSIS — I5033 Acute on chronic diastolic (congestive) heart failure: Secondary | ICD-10-CM | POA: Diagnosis not present

## 2017-07-02 ENCOUNTER — Observation Stay (HOSPITAL_COMMUNITY)
Admission: EM | Admit: 2017-07-02 | Discharge: 2017-07-02 | Discharge status: Against Medical Advice | Payer: Medicare HMO | Attending: Family Medicine | Admitting: Family Medicine

## 2017-07-02 ENCOUNTER — Emergency Department (HOSPITAL_COMMUNITY): Payer: Medicare HMO

## 2017-07-02 ENCOUNTER — Encounter (HOSPITAL_COMMUNITY): Payer: Self-pay | Admitting: Emergency Medicine

## 2017-07-02 DIAGNOSIS — Z7982 Long term (current) use of aspirin: Secondary | ICD-10-CM | POA: Insufficient documentation

## 2017-07-02 DIAGNOSIS — Z791 Long term (current) use of non-steroidal anti-inflammatories (NSAID): Secondary | ICD-10-CM | POA: Diagnosis not present

## 2017-07-02 DIAGNOSIS — E785 Hyperlipidemia, unspecified: Secondary | ICD-10-CM | POA: Diagnosis not present

## 2017-07-02 DIAGNOSIS — J449 Chronic obstructive pulmonary disease, unspecified: Secondary | ICD-10-CM | POA: Diagnosis not present

## 2017-07-02 DIAGNOSIS — G4733 Obstructive sleep apnea (adult) (pediatric): Secondary | ICD-10-CM | POA: Diagnosis not present

## 2017-07-02 DIAGNOSIS — R079 Chest pain, unspecified: Principal | ICD-10-CM | POA: Diagnosis present

## 2017-07-02 DIAGNOSIS — R0789 Other chest pain: Secondary | ICD-10-CM | POA: Diagnosis not present

## 2017-07-02 DIAGNOSIS — Z79891 Long term (current) use of opiate analgesic: Secondary | ICD-10-CM | POA: Insufficient documentation

## 2017-07-02 DIAGNOSIS — Z7984 Long term (current) use of oral hypoglycemic drugs: Secondary | ICD-10-CM | POA: Diagnosis not present

## 2017-07-02 DIAGNOSIS — F329 Major depressive disorder, single episode, unspecified: Secondary | ICD-10-CM | POA: Diagnosis not present

## 2017-07-02 DIAGNOSIS — F17210 Nicotine dependence, cigarettes, uncomplicated: Secondary | ICD-10-CM | POA: Insufficient documentation

## 2017-07-02 DIAGNOSIS — G4700 Insomnia, unspecified: Secondary | ICD-10-CM | POA: Insufficient documentation

## 2017-07-02 DIAGNOSIS — Z8673 Personal history of transient ischemic attack (TIA), and cerebral infarction without residual deficits: Secondary | ICD-10-CM | POA: Insufficient documentation

## 2017-07-02 DIAGNOSIS — I5032 Chronic diastolic (congestive) heart failure: Secondary | ICD-10-CM | POA: Diagnosis not present

## 2017-07-02 DIAGNOSIS — Z79899 Other long term (current) drug therapy: Secondary | ICD-10-CM | POA: Insufficient documentation

## 2017-07-02 DIAGNOSIS — E119 Type 2 diabetes mellitus without complications: Secondary | ICD-10-CM | POA: Diagnosis not present

## 2017-07-02 DIAGNOSIS — I110 Hypertensive heart disease with heart failure: Secondary | ICD-10-CM | POA: Insufficient documentation

## 2017-07-02 LAB — CREATININE, SERUM
CREATININE: 0.93 mg/dL (ref 0.44–1.00)
GFR calc Af Amer: >60 mL/min (ref >60)

## 2017-07-02 LAB — BASIC METABOLIC PANEL
Anion gap: 8 (ref 5–15)
BUN: 9 mg/dL (ref 6–20)
CALCIUM: 8.9 mg/dL (ref 8.9–10.3)
CHLORIDE: 106 mmol/L (ref 101–111)
CO2: 23 mmol/L (ref 22–32)
Creatinine, Ser: 1.01 mg/dL — ABNORMAL HIGH (ref 0.44–1.00)
GFR calc non Af Amer: >60 mL/min (ref >60)
Glucose, Bld: 124 mg/dL — ABNORMAL HIGH (ref 65–99)
Potassium: 4.2 mmol/L (ref 3.5–5.1)
SODIUM: 137 mmol/L (ref 135–145)

## 2017-07-02 LAB — CBC
HCT: 43.4 % (ref 36.0–46.0)
HCT: 41.4 % (ref 36.0–46.0)
HEMOGLOBIN: 13.7 g/dL (ref 12.0–15.0)
Hemoglobin: 14.2 g/dL (ref 12.0–15.0)
MCH: 27.6 pg (ref 26.0–34.0)
MCH: 27.9 pg (ref 26.0–34.0)
MCHC: 32.7 g/dL (ref 30.0–36.0)
MCHC: 33.1 g/dL (ref 30.0–36.0)
MCV: 84.4 fL (ref 78.0–100.0)
MCV: 84.3 fL (ref 78.0–100.0)
PLATELETS: 257 10*3/uL (ref 150–400)
Platelets: 307 10*3/uL (ref 150–400)
RBC: 5.14 MIL/uL — ABNORMAL HIGH (ref 3.87–5.11)
RBC: 4.91 MIL/uL (ref 3.87–5.11)
RDW: 14.4 % (ref 11.5–15.5)
RDW: 14.5 % (ref 11.5–15.5)
WBC: 13.2 10*3/uL — ABNORMAL HIGH (ref 4.0–10.5)
WBC: 10.6 10*3/uL — ABNORMAL HIGH (ref 4.0–10.5)

## 2017-07-02 LAB — LIPID PANEL
Cholesterol: 120 mg/dL (ref 0–200)
HDL: 29 mg/dL — ABNORMAL LOW (ref >40)
LDL CALC: 68 mg/dL (ref 0–99)
TRIGLYCERIDES: 117 mg/dL (ref <150)
Total CHOL/HDL Ratio: 4.1 RATIO
VLDL: 23 mg/dL (ref 0–40)

## 2017-07-02 LAB — I-STAT TROPONIN, ED: Troponin i, poc: 0 ng/mL (ref 0.00–0.08)

## 2017-07-02 LAB — TROPONIN I

## 2017-07-02 MED ORDER — ASPIRIN EC 81 MG PO TBEC: 81.0000 mg | DELAYED_RELEASE_TABLET | Freq: Every day | ORAL | Status: DC

## 2017-07-02 MED ORDER — TIOTROPIUM BROMIDE MONOHYDRATE 18 MCG IN CAPS
18.0000 ug | ORAL_CAPSULE | Freq: Every day | RESPIRATORY_TRACT | Status: DC
  Filled 2017-07-02: qty 5

## 2017-07-02 MED ORDER — NICOTINE 14 MG/24HR TD PT24: 14.0000 mg | MEDICATED_PATCH | Freq: Every day | TRANSDERMAL | Status: DC

## 2017-07-02 MED ORDER — MOMETASONE FURO-FORMOTEROL FUM 200-5 MCG/ACT IN AERO
2.0000 | INHALATION_SPRAY | Freq: Two times a day (BID) | RESPIRATORY_TRACT | Status: DC
  Filled 2017-07-02: qty 8.8

## 2017-07-02 MED ORDER — PINDOLOL 5 MG PO TABS
5.0000 mg | ORAL_TABLET | Freq: Two times a day (BID) | ORAL | Status: DC
  Filled 2017-07-02: qty 1

## 2017-07-02 MED ORDER — ATORVASTATIN CALCIUM 40 MG PO TABS: 40.0000 mg | ORAL_TABLET | Freq: Every day | ORAL | Status: DC

## 2017-07-02 MED ORDER — INSULIN ASPART 100 UNIT/ML ~~LOC~~ SOLN: 0.0000 [IU] | SUBCUTANEOUS | Status: DC

## 2017-07-02 MED ORDER — ENOXAPARIN SODIUM 40 MG/0.4ML ~~LOC~~ SOLN
40.0000 mg | SUBCUTANEOUS | Status: DC
  Filled 2017-07-02: qty 0.4

## 2017-07-02 MED ORDER — PANTOPRAZOLE SODIUM 40 MG PO TBEC: 40.0000 mg | DELAYED_RELEASE_TABLET | Freq: Every day | ORAL | Status: DC

## 2017-07-02 MED ORDER — NITROGLYCERIN 0.4 MG SL SUBL
0.4000 mg | SUBLINGUAL_TABLET | SUBLINGUAL | Status: DC | PRN
  Filled 2017-07-02: qty 1

## 2017-07-02 MED ORDER — ACETAMINOPHEN 325 MG PO TABS: 650.0000 mg | ORAL_TABLET | ORAL | Status: DC | PRN

## 2017-07-02 MED ORDER — ONDANSETRON HCL 4 MG/2ML IJ SOLN: 4.0000 mg | Freq: Four times a day (QID) | INTRAMUSCULAR | Status: DC | PRN

## 2017-07-02 MED ORDER — LISINOPRIL 20 MG PO TABS
20.0000 mg | ORAL_TABLET | Freq: Every day | ORAL | Status: DC
Start: 2017-07-02 — End: 2017-07-02

## 2017-07-02 MED ORDER — TORSEMIDE 20 MG PO TABS: 60.0000 mg | ORAL_TABLET | Freq: Every day | ORAL | Status: DC

## 2017-07-02 NOTE — Discharge Summary (Signed)
[facility] Discharge Summary  Patient name: Hailey Barnes Medical record number: [id number] Date of birth: 04/14/1961 Age: 56 y.o. Gender: female Date of Admission: 07/02/2017  Date of Discharge: 07/02/2017 Admitting Physician: No admitting provider for patient encounter.  Primary Care Provider: [name], DO Consultants: Cardiology  Indication for Hospitalization: Chest pain  Discharge Diagnoses/Problem List:  Chest pain Hypertension Type 2 diabetes Hyperlipidemia HFpEF Obesity Tobacco use COPD  Disposition: Home, AMA  Discharge Condition: Stable  Discharge Exam: See H&P for exam, patient left AMA and was not examined immediately prior to discharge.  Brief Hospital Course:  56 yo female with PMH significant for HTN, DM2, HLD, HFpEF, tobacco abuse, COPD who presented via EMS with central chest tightness/burning with radiation to her left breast, worse with exertion.  She had associated SOB and feeling hot/cold/clammy. EMS gave aspirin and nitro x1 that relieved her pain. In the ED, labs notable for WBC 13.2 > 10.6, Cr 1.01. CXR no acute process. EKG sinus rhythm and nonspecific T wave changes. Troponin negative x2.  Because symptoms were worse with exertion and were relieved by nitro, it is concerning for cardiac etiology.  Patient had minimal LAD disease on heart cath in 2015.  Cardiology was consulted.  Notified by nurse before cardiology evaluation that patient wanted to leave AMA.  Spoke with patient regarding reasoning to complete cardiac work up and advised to stay and let cardiology evaluate. She persisted and wanted to leave AMA. Discussed risks to leaving against medical advice and patient understood.  Issues for Follow Up:  1. Follow up on chest pain symptoms  Significant Procedures: None  Significant Labs and Imaging:   Recent Labs Lab 07/02/17 0135 07/02/17 0652  WBC 13.2* 10.6*  HGB 14.2 13.7  HCT 43.4 41.4  PLT 307 257     Recent Labs Lab 07/02/17 0135 07/02/17 0652  NA 137  --   K 4.2  --   CL 106  --   CO2 23  --   GLUCOSE 124*  --   BUN 9  --   CREATININE 1.01* 0.93  CALCIUM 8.9  --    EKG: NSR, no significant change from prior  I stat trop 0.00  CXR 07/02/17: The cardiomediastinal contours are normal. Mild chronic bronchial thickening. Pulmonary vasculature is normal. No consolidation, pleural effusion, or pneumothorax. No acute osseous abnormalities are seen. IMPRESSION: Mild chronic bronchial thickening without acute abnormality.  Results/Tests Pending at Time of Discharge: None  Discharge Medications:  Allergies as of 07/02/2017   No Known Allergies     Medication List    ASK your doctor about these medications   ACCU-CHEK AVIVA PLUS w/Device Kit Use daily as needed to check blood sugar.   accu-chek softclix lancets Use daily as needed to check blood sugar.   acetaminophen 500 MG tablet Commonly known as:  TYLENOL Take 1 tablet (500 mg total) by mouth every 6 (six) hours as needed.   albuterol 108 (90 Base) MCG/ACT inhaler Commonly known as:  PROVENTIL HFA;VENTOLIN HFA Inhale 2 puffs into the lungs every 6 (six) hours as needed for wheezing or shortness of breath.   aspirin 81 MG tablet Take 81 mg by mouth daily as needed (for chest pain).   atorvastatin 40 MG tablet Commonly known as:  LIPITOR TAKE ONE TABLET BY MOUTH ONCE DAILY   DULERA 200-5 MCG/ACT Aero Generic drug:  mometasone-formoterol Inhale 2 puffs into the lungs 2 (two) times daily as needed for wheezing or  shortness of breath.   E-Z SPACER inhaler Use as instructed   glucose blood test strip Commonly known as:  ACCU-CHEK AVIVA Use daily as needed to check blood sugar.   hydrocortisone 1 % ointment Apply 1 application topically 2 (two) times daily as needed (eczema on face).   hydrOXYzine 50 MG tablet Commonly known as:  ATARAX/VISTARIL Take 50 mg by mouth at bedtime as needed (sleep).    ibuprofen 800 MG tablet Commonly known as:  ADVIL,MOTRIN Take 800 mg by mouth daily as needed for mild pain.   lisinopril 20 MG tablet Commonly known as:  PRINIVIL,ZESTRIL Take 1 tablet (20 mg total) by mouth daily.   magnesium chloride 64 MG Tbec SR tablet Commonly known as:  SLOW-MAG Take 2 tablets (128 mg total) by mouth daily.   metFORMIN 1000 MG tablet Commonly known as:  GLUCOPHAGE Take 1 tablet (1,000 mg total) by mouth 2 (two) times daily with a meal.   metroNIDAZOLE 500 MG tablet Commonly known as:  FLAGYL Take 1 tablet (500 mg total) by mouth 2 (two) times daily.   nitrofurantoin (macrocrystal-monohydrate) 100 MG capsule Commonly known as:  MACROBID Take 1 capsule (100 mg total) by mouth 2 (two) times daily.   omeprazole 40 MG capsule Commonly known as:  PRILOSEC Take 40 mg by mouth daily.   oxyCODONE 5 MG immediate release tablet Commonly known as:  Oxy IR/ROXICODONE Take 1 tablet (5 mg total) by mouth every 4 (four) hours as needed for severe pain.   pindolol 5 MG tablet Commonly known as:  VISKEN Take 1 tablet (5 mg total) by mouth 2 (two) times daily.   Potassium Chloride ER 20 MEQ Tbcr TAKE 1 TABLET BY MOUTH ONCE DAILY   tiotropium 18 MCG inhalation capsule Commonly known as:  SPIRIVA Place 1 capsule (18 mcg total) into inhaler and inhale daily.   torsemide 20 MG tablet Commonly known as:  DEMADEX Take 3 tablets (60 mg total) by mouth daily. Take 3 tablets by mouth daily   triamcinolone cream 0.1 % Commonly known as:  KENALOG Apply 1 application topically 3 (three) times daily as needed (eczema on arms).       Discharge Instructions: Please refer to Patient Instructions section of EMR for full details.  Patient was counseled important signs and symptoms that should prompt return to medical care, changes in medications, dietary instructions, activity restrictions, and follow up appointments.   Follow-Up Appointments:   [name],  DO 07/02/2017, 2:04 PM PGY-1, [place]

## 2017-07-02 NOTE — ED Provider Notes (Signed)
TIME SEEN: 1:35 AM  CHIEF COMPLAINT: Chest pain  HPI: Patient is a 56 year old obese lady with history of hypertension, diabetes, hyperlipidemia, tobacco use, COPD, CHF who presents to the emergency department with complaints of chest pain. Pain started around 9 PM and described as a central pain without radiation that was a tightness in the middle of her chest. Had associated cold sweats, dizziness and shortness of breath. Given one nitroglycerin with EMS and symptoms completely resolved. She reports last catheterization was 2 years ago. No history of stress test. She does not have any stents. No history of PE or DVT. No lower extremity swelling or pain. No fevers or cough.  ROS: See HPI Constitutional: no fever  Eyes: no drainage  ENT: no runny nose   Cardiovascular:   chest pain  Resp:  SOB  GI: no vomiting GU: no dysuria Integumentary: no rash  Allergy: no hives  Musculoskeletal: no leg swelling  Neurological: no slurred speech ROS otherwise negative  PAST MEDICAL HISTORY/PAST SURGICAL HISTORY:  Past Medical History:  Diagnosis Date  . Arthritis    "knees" (02/21/2014), back  . CHF (congestive heart failure) ([place])   . Chronic bronchitis ([place])    "get it q yr" (02/21/2014)  . Chronic diastolic CHF (congestive heart failure) ([place])    a. Echo 05/2014: EF 65-70% with Grade 1 DD.  Marland Kitchen Chronic lower back pain   . Complication of anesthesia    "I don't come out well; I chew on my tongue"  . COPD (chronic obstructive pulmonary disease) ([place])   . Diabetes mellitus without complication ([place])    TYPE 2  . Family history of adverse reaction to anesthesia    BROTHER PONV, had a blood clot several days later and heart stopped  . Fatty liver    NONALCOLIC  . Fibroid   . Heart murmur   . Hypertension   . Infection of right eye    current dx on Saturday 7/15 - using drops  . Leg swelling    bilateral  . Migraine 1982-2009  . MVP (mitral valve prolapse)   . Neuromuscular disorder ([place])     right leg nerve pain  . Shortness of breath    WITH EXERTION  . Sinus pause    a. noted on telemetry during admission from 10/2016, lasting up to 4.4 seconds. BB discontinued.   . Sleep apnea 02/2016   CPAP 16 TO 20  . Stroke [facility]) noted on CAT 02/2014   "light", LLE weakness remains (03/30/2014)  . 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 Tingling in extremities 12/12/2010   Uric acid and electrolytes (02/23) normal but WBC elevated.   D/Dx: carpal tunnel, ulnar neuropathy Less likely: cervical radiculopathy, vasculitis     MEDICATIONS:  Prior to Admission medications   Medication Sig Start Date End Date Taking? Authorizing Provider  acetaminophen (TYLENOL) 500 MG tablet Take 1 tablet (500 mg total) by mouth every 6 (six) hours as needed. Patient taking differently: Take 500 mg by mouth every 6 (six) hours as needed for mild pain.  03/18/17   Carlyle Dolly, MD  albuterol (PROVENTIL HFA;VENTOLIN HFA) 108 (90 Base) MCG/ACT inhaler Inhale 2 puffs into the lungs every 6 (six) hours as needed for wheezing or shortness of breath.     [provider]  aspirin 81 MG tablet Take 81 mg by mouth daily.    [provider]  atorvastatin (LIPITOR)  40 MG tablet TAKE ONE TABLET BY MOUTH ONCE DAILY 11/20/16   Vivi Barrack, MD  Blood Glucose Monitoring Suppl (ACCU-CHEK AVIVA PLUS) w/Device KIT Use daily as needed to check blood sugar. 10/30/16   Vivi Barrack, MD  glucose blood (ACCU-CHEK AVIVA) test strip Use daily as needed to check blood sugar. 11/26/16   Vivi Barrack, MD  hydrocortisone 1 % ointment Apply 1 application topically 2 (two) times daily as needed (eczema on face). 05/21/16   Vivi Barrack, MD  hydrOXYzine (ATARAX/VISTARIL) 50 MG tablet Take 50 mg by mouth at bedtime as needed (sleep).    [provider]  ibuprofen (ADVIL,MOTRIN) 800 MG tablet Take 800 mg by mouth daily as needed for mild pain.      [provider]  Lancet Devices [facility]) lancets Use daily as needed to check blood sugar. 11/26/16   Vivi Barrack, MD  lisinopril (PRINIVIL,ZESTRIL) 20 MG tablet Take 1 tablet (20 mg total) by mouth daily. 12/11/16 03/18/18  Eileen Stanford, PA-C  magnesium chloride (SLOW-MAG) 64 MG TBEC SR tablet Take 2 tablets (128 mg total) by mouth daily. Patient taking differently: Take 2 tablets by mouth daily as needed (leg cramp "charlie horse").  10/31/16   Vivi Barrack, MD  metFORMIN (GLUCOPHAGE) 1000 MG tablet Take 1 tablet (1,000 mg total) by mouth 2 (two) times daily with a meal. Patient taking differently: Take 1,000 mg by mouth daily with breakfast.  08/07/16   Vivi Barrack, MD  metroNIDAZOLE (FLAGYL) 500 MG tablet Take 1 tablet (500 mg total) by mouth 2 (two) times daily. 06/01/17   Huel Cote, NP  mometasone-formoterol (DULERA) 200-5 MCG/ACT AERO Inhale 2 puffs into the lungs 2 (two) times daily as needed for wheezing or shortness of breath.     [provider]  nitrofurantoin, macrocrystal-monohydrate, (MACROBID) 100 MG capsule Take 1 capsule (100 mg total) by mouth 2 (two) times daily. 06/04/17   Huel Cote, NP  oxyCODONE (OXY IR/ROXICODONE) 5 MG immediate release tablet Take 1 tablet (5 mg total) by mouth every 4 (four) hours as needed for severe pain. 06/17/17   Donnie Mesa, MD  pindolol (VISKEN) 5 MG tablet Take 1 tablet (5 mg total) by mouth 2 (two) times daily. Patient taking differently: Take 5 mg by mouth daily.  12/10/16   Eileen Stanford, PA-C  Potassium Chloride ER 20 MEQ TBCR TAKE 1 TABLET BY MOUTH ONCE DAILY 04/06/17   Vivi Barrack, MD  Spacer/Aero-Holding Chambers (E-Z SPACER) inhaler Use as instructed 01/22/16   Olam Idler, MD  tiotropium (SPIRIVA) 18 MCG inhalation capsule Place 1 capsule (18 mcg total) into inhaler and inhale daily. Patient taking differently: Place 18 mcg into inhaler and inhale daily as needed (shortness of  breath).  03/18/16   Kinnie Feil, MD  torsemide (DEMADEX) 20 MG tablet Take 3 tablets (60 mg total) by mouth daily. Take 3 tablets by mouth daily 01/13/17   Vivi Barrack, MD  triamcinolone cream (KENALOG) 0.1 % Apply 1 application topically 3 (three) times daily as needed (eczema on arms). 05/21/16   Vivi Barrack, MD    ALLERGIES:  No Known Allergies  SOCIAL HISTORY:  Social History  Substance Use Topics  . Smoking status: Current Every Day Smoker    Packs/day: 0.50    Years: 40.00    Types: Cigarettes  . Smokeless tobacco: Never Used     Comment: Would like to quit  .  Alcohol use No    FAMILY HISTORY: Family History  Problem Relation Age of Onset  . Hypertension Mother   . COPD Mother   . Hypertension Father   . Cancer Sister        UTERINE???  . COPD Sister   . Hypertension Sister   . Hypertension Sister     EXAM: BP (!) 151/101 (BP Location: Right Arm)   Pulse 80   Temp 98 F (36.7 C) (Oral)   Resp 16   Ht 5' 2"  (1.575 m)   Wt 131.5 kg (290 lb)   SpO2 96%   BMI 53.04 kg/m  CONSTITUTIONAL: Alert and oriented and responds appropriately to questions. Well-appearing; well-nourished, obese HEAD: Normocephalic EYES: Conjunctivae clear, pupils appear equal, EOMI ENT: normal nose; moist mucous membranes NECK: Supple, no meningismus, no nuchal rigidity, no LAD  CARD: RRR; S1 and S2 appreciated; no murmurs, no clicks, no rubs, no gallops RESP: Normal chest excursion without splinting or tachypnea; breath sounds clear and equal bilaterally; no wheezes, no rhonchi, no rales, no hypoxia or respiratory distress, speaking full sentences ABD/GI: Normal bowel sounds; non-distended; soft, non-tender, no rebound, no guarding, no peritoneal signs, no hepatosplenomegaly BACK:  The back appears normal and is non-tender to palpation, there is no CVA tenderness EXT: Normal ROM in all joints; non-tender to palpation; no edema; normal capillary refill; no cyanosis, no calf  tenderness or swelling    SKIN: Normal color for age and race; warm; no rash NEURO: Moves all extremities equally PSYCH: The patient's mood and manner are appropriate. Grooming and personal hygiene are appropriate.  MEDICAL DECISION MAKING: Patient here with chest pain. Her heart score is 4. Troponin negative. EKG shows no new changes. Chest x-ray clear. She does not appear volume overloaded. Have recommended admission for chest pain rule out. She is pain-free at this time. PCP is with family medicine.  ED PROGRESS:    2:49 AM Discussed patient's case with family medicine resident.  I have recommended admission and patient (and family if present) agree with this plan. Admitting physician will place admission orders.   I reviewed all nursing notes, vitals, pertinent previous records, EKGs, lab and urine results, imaging (as available).      EKG Interpretation  Date/Time:  Thursday July 02 2017 01:18:53 EDT Ventricular Rate:  80 PR Interval:    QRS Duration: 91 QT Interval:  412 QTC Calculation: 476 R Axis:   7 Text Interpretation:  Sinus rhythm LAE, consider biatrial enlargement Low voltage, precordial leads Confirmed by Ward, Cyril Mourning [phone]) on 07/02/2017 1:29:55 AM         Ward, Delice Bison, DO 07/02/17 [id number]

## 2017-07-02 NOTE — ED Notes (Signed)
Called admitting to notify that pt wants to leave AMA.

## 2017-07-02 NOTE — H&P (Signed)
[facility] Admission History and Physical Service Pager: [phone]  Patient name: Hailey Barnes Medical record number: [id number] Date of birth: [birth year] Age: 56 y.o. Gender: female  Primary Care Provider: [name], DO Consultants: cards in the AM  Code Status: FULL  Chief Complaint: chest pain  Assessment and Plan: Hailey Barnes is a 56 y.o. female presenting with central chest pain. PMH is significant for HTN, DM2, HLD, HFpEF, Obesity, Tobacco use, COPD.   Chest Pain: vitals are stable. HEART score 5 (with moderately suspicious symptom, nonspecific T wave changes, age, risk factors). Symptoms are concerning for cardiac etiology as it worsened with activity and relieved with nitro in the ambulance.  EKG overall without significant change from prior. istat troponin 0.00. Less likely indigestion. Also considered PE; wells score is 1.5 (low risk) due to surgery in the past 2 weeks. However, less likely as patient is not tachycardic, no oxygen desaturation, and relief of symptoms with nitroglycerin in the ambulance. CXR is negative for acute process. Of note, patient had heart cath in 2015 which was normal except for minimal disease in LAD.  - admit to telemetry, attending Dr. Ardelia Mems S/p ASA 335m and nitro x 1 in ambulance  - SL nitroglycerin PRN  - troponin I x 3  - EKG in the AM - cards consult in the AM  - repeat lipid panel - continue lipitor 448mdaily - ASA 816maily - NPO in case cardiology would like to do studies   Leukocytosis: wbc to 13.2. Afebrile. No source of possible focal infection in history or physical exam. Recently had surgery for cholecystectomy and has been recovering well (pain improving). Incision sites are unremarkable.  - monitor closely  for now  HTN: Mild elevation initially with normal BP on repeat.  - continue Pindolol 5mg54mD and Lisinopril 20mg57mly  DM2: Last a1c 7.2 in 05/2017. Home medication Metformin  1000mg 44m  - hold metformin for now  - cbgs q 4 hr with Sensitive SSI (since NPO)   HLD: Lipid 11/2016: total cholesterol 129, HDL 30, TAG 249, LDL 49. Compliant with Lipitor - repeat lipid panel - continue Lipitor  HFpEF: EHO in 10/2016 with EF 75% and G1DD and severe LV wall thickening. Home meds: pindolol 5mg BI50mLisinopirl 20mg da53m Torsemide 60mg dai61mK supplement. Very mild trace pitting edema in the bilateral lower extremities.  - continue home meds   Tobacco Use: 0.5ppd x 30year. - would like Nicotine patch (14mg)   C11m not compliant with medications. She could not give me a reason; reports she forgets. In the med rec it was reported that she takes Spiriva and Dulera PRN.  - Dulera aRuthe Mannanva - counseling on proper use  FEN/GI: NPO until cardiology sees patient Prophylaxis: Lovenox   Disposition: admit for chest pain evaluation  History of Present Illness:  Hailey Barnes.o. fe24le presenting with chest pain. Patient reports that she was laying down around 9 pm yesterday evening when she started to have central chest tightness/burning with radiation to her left breast. She had associated shortness of breath and feeling of hot/cold and clammy. No nausea. She initially thought it was indigestion. The chest pain worsened when she ambulated to her bathroom. The symptoms lasted about 2 hours and stopped after EMS gave ASA and nitro x 1. No recent illness with fevers, chills, cold like symptoms. She recently had an elective cholecystectomy (06/17/2017) and reports she is recovering well from  this. She still has some abdominal soreness but this has been improving over time. She reports that she recently completed treatment for bacterial vaginosis and UTI (with macrobid); she no longer has symptoms of dysuria or increased urinary frequency.   She smokes tobacco 0.5ppd x 30 years. She had a heart cath in 2015 which was largely normal (minimal disease in LAD).   Review Of  Systems: Per HPI   Review of Systems  Constitutional: Positive for diaphoresis. Negative for chills and fever.  HENT: Negative for congestion.   Eyes: Negative for blurred vision.  Respiratory: Positive for shortness of breath. Negative for cough.   Cardiovascular: Positive for chest pain. Negative for palpitations, orthopnea and leg swelling.  Gastrointestinal: Negative for abdominal pain, constipation, diarrhea, nausea and vomiting.  Genitourinary: Negative for dysuria and urgency.  Neurological: Negative for focal weakness and headaches.  Psychiatric/Behavioral: Negative for substance abuse.    Patient Active Problem List   Diagnosis Date Noted  . Pain in joint, ankle and foot 04/29/2017  . Colon cancer screening   . Benign neoplasm of sigmoid colon   . Insomnia 02/17/2017  . Abdominal pain, right upper quadrant 12/03/2016  . OSA (obstructive sleep apnea) 10/30/2016  . Sinus pause 10/23/2016  . Chest pain 10/22/2016  . Panic attack 10/22/2016  . Lower extremity edema   . Nephrolithiasis 09/18/2016  . Lumbar radiculopathy 06/02/2016  . Hyperlipidemia 02/07/2016  . COPD exacerbation ([place]) 01/18/2016  . T2DM (type 2 diabetes mellitus) ([place]) 12/07/2015  . RUQ abdominal pain 09/21/2015  . Occipital lymphadenopathy 09/21/2015  . Depression 04/19/2015  . Allergic rhinitis 08/01/2014  . Eczema 08/01/2014  . Diminished vision 06/23/2014  . Chronic diastolic CHF (congestive heart failure) ([place]) 06/13/2014  . Restrictive lung disease 05/10/2014  . NASH (nonalcoholic steatohepatitis) 01/04/2014  . Hypertension 05/26/2010  . Morbid obesity ([place]) 12/17/2006  . Tobacco abuse 12/17/2006    Past Medical History: Past Medical History:  Diagnosis Date  . Arthritis    "knees" (02/21/2014), back  . CHF (congestive heart failure) ([place])   . Chronic bronchitis ([place])    "get it q yr" (02/21/2014)  . Chronic diastolic CHF (congestive heart failure) ([place])    a. Echo 05/2014: EF 65-70% with  Grade 1 DD.  Marland Kitchen Chronic lower back pain   . Complication of anesthesia    "I don't come out well; I chew on my tongue"  . COPD (chronic obstructive pulmonary disease) ([place])   . Diabetes mellitus without complication ([place])    TYPE 2  . Family history of adverse reaction to anesthesia    BROTHER PONV, had a blood clot several days later and heart stopped  . Fatty liver    NONALCOLIC  . Fibroid   . Heart murmur   . Hypertension   . Infection of right eye    current dx on Saturday 7/15 - using drops  . Leg swelling    bilateral  . Migraine 1982-2009  . MVP (mitral valve prolapse)   . Neuromuscular disorder ([place])    right leg nerve pain  . Shortness of breath    WITH EXERTION  . Sinus pause    a. noted on telemetry during admission from 10/2016, lasting up to 4.4 seconds. BB discontinued.   . Sleep apnea 02/2016   CPAP 16 TO 20  . Stroke [facility]) noted on CAT 02/2014   "light", LLE weakness remains (03/30/2014)  . 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 Tingling in extremities 12/12/2010   Uric acid and electrolytes (02/23) normal but WBC elevated.   D/Dx: carpal tunnel, ulnar neuropathy Less likely: cervical radiculopathy, vasculitis     Past Surgical History: Past Surgical History:  Procedure Laterality Date  . [place]  . CHOLECYSTECTOMY N/A 06/17/2017   Procedure: LAPAROSCOPIC CHOLECYSTECTOMY WITH INTRAOPERATIVE CHOLANGIOGRAM;  Surgeon: Donnie Mesa, MD;  Location: [facility];  Service: General;  Laterality: N/A;  . COLONOSCOPY WITH PROPOFOL N/A 04/28/2017   Procedure: COLONOSCOPY WITH PROPOFOL;  Surgeon: Manus Gunning, MD;  Location: [facility];  Service: Gastroenterology;  Laterality: N/A;  . DILATATION & CURETTAGE/HYSTEROSCOPY WITH MYOSURE N/A 05/06/2016   Procedure: DILATATION & CURETTAGE/HYSTEROSCOPY WITH MYOSURE;  Surgeon: Terrance Mass, MD;  Location: [facility];  Service: Gynecology;  Laterality: N/A;   request to follow around 8:45  requests one hour OR time  . DILATATION & CURETTAGE/HYSTEROSCOPY WITH MYOSURE N/A 04/03/2017   Procedure: DILATATION & CURETTAGE/HYSTEROSCOPY WITH MYOSURE;  Surgeon: Terrance Mass, MD;  Location: [facility];  Service: Gynecology;  Laterality: N/A;  . DILATION AND CURETTAGE OF UTERUS    . ESOPHAGOGASTRODUODENOSCOPY (EGD) WITH PROPOFOL N/A 04/28/2017   Procedure: ESOPHAGOGASTRODUODENOSCOPY (EGD) WITH PROPOFOL;  Surgeon: Manus Gunning, MD;  Location: [facility];  Service: Gastroenterology;  Laterality: N/A;  . FOOT SURGERY Bilateral    "took bones out; put pins in"  . LEFT AND RIGHT HEART CATHETERIZATION WITH CORONARY ANGIOGRAM N/A 03/31/2014   Procedure: LEFT AND RIGHT HEART CATHETERIZATION WITH CORONARY ANGIOGRAM;  Surgeon: Birdie Riddle, MD;  Location: [place] [place] [facility];  Service: Cardiovascular;  Laterality: N/A;  . MYOMECTOMY  YRS AGO  . sonohystogram  04/14/2016   [facility]: intramural fibroid, premenopausal endometrium, right fluid filled tubular structure    Social History: Social History  Substance Use Topics  . Smoking status: Current Every Day Smoker    Packs/day: 0.50    Years: 40.00    Types: Cigarettes  . Smokeless tobacco: Never Used     Comment: Would like to quit  . Alcohol use No   Additional social history: no other drug use  Please also refer to relevant sections of EMR.  Family History: Family History  Problem Relation Age of Onset  . Hypertension Mother   . COPD Mother   . Hypertension Father   . Cancer Sister        UTERINE???  . COPD Sister   . Hypertension Sister   . Hypertension Sister     Allergies and Medications: No Known Allergies No current facility-administered medications on file prior to encounter.    Current Outpatient Prescriptions on File Prior to Encounter  Medication Sig Dispense Refill  . acetaminophen (TYLENOL) 500 MG tablet Take 1 tablet (500 mg total) by mouth every 6  (six) hours as needed. (Patient taking differently: Take 500 mg by mouth every 6 (six) hours as needed for mild pain. ) 30 tablet 0  . aspirin 81 MG tablet Take 81 mg by mouth daily as needed (for chest pain).     Marland Kitchen lisinopril (PRINIVIL,ZESTRIL) 20 MG tablet Take 1 tablet (20 mg total) by mouth daily. 90 tablet 3  . metFORMIN (GLUCOPHAGE) 1000 MG tablet Take 1 tablet (1,000 mg total) by mouth 2 (two) times daily with a meal. 180 tablet 3  . pindolol (VISKEN) 5 MG tablet Take 1 tablet (5 mg total) by mouth 2 (two) times daily. (Patient taking differently: Take 5 mg by  mouth daily. ) 180 tablet 3  . Potassium Chloride ER 20 MEQ TBCR TAKE 1 TABLET BY MOUTH ONCE DAILY (Patient taking differently: Take 20 mEq by mouth once a day) 30 tablet 11  . torsemide (DEMADEX) 20 MG tablet Take 3 tablets (60 mg total) by mouth daily. Take 3 tablets by mouth daily (Patient taking differently: Take 60 mg by mouth daily. ) 90 tablet 11  . albuterol (PROVENTIL HFA;VENTOLIN HFA) 108 (90 Base) MCG/ACT inhaler Inhale 2 puffs into the lungs every 6 (six) hours as needed for wheezing or shortness of breath.     Marland Kitchen atorvastatin (LIPITOR) 40 MG tablet TAKE ONE TABLET BY MOUTH ONCE DAILY (Patient taking differently: Take 40 mg by mouth once a day) 90 tablet 3  . Blood Glucose Monitoring Suppl (ACCU-CHEK AVIVA PLUS) w/Device KIT Use daily as needed to check blood sugar. 1 kit 0  . glucose blood (ACCU-CHEK AVIVA) test strip Use daily as needed to check blood sugar. 100 each 12  . hydrocortisone 1 % ointment Apply 1 application topically 2 (two) times daily as needed (eczema on face). 30 g 1  . hydrOXYzine (ATARAX/VISTARIL) 50 MG tablet Take 50 mg by mouth at bedtime as needed (sleep).    Marland Kitchen ibuprofen (ADVIL,MOTRIN) 800 MG tablet Take 800 mg by mouth daily as needed for mild pain.     Elmore Guise Devices (ACCU-CHEK SOFTCLIX) lancets Use daily as needed to check blood sugar. 1 each 11  . magnesium chloride (SLOW-MAG) 64 MG TBEC SR tablet  Take 2 tablets (128 mg total) by mouth daily. (Patient taking differently: Take 2 tablets by mouth daily as needed (FOR LEG CRAMPS). ) 60 tablet 0  . metroNIDAZOLE (FLAGYL) 500 MG tablet Take 1 tablet (500 mg total) by mouth 2 (two) times daily. (Patient not taking: Reported on 07/02/2017) 14 tablet 0  . mometasone-formoterol (DULERA) 200-5 MCG/ACT AERO Inhale 2 puffs into the lungs 2 (two) times daily as needed for wheezing or shortness of breath.     . nitrofurantoin, macrocrystal-monohydrate, (MACROBID) 100 MG capsule Take 1 capsule (100 mg total) by mouth 2 (two) times daily. (Patient not taking: Reported on 07/02/2017) 14 capsule 0  . oxyCODONE (OXY IR/ROXICODONE) 5 MG immediate release tablet Take 1 tablet (5 mg total) by mouth every 4 (four) hours as needed for severe pain. 30 tablet 0  . Spacer/Aero-Holding Chambers (E-Z SPACER) inhaler Use as instructed 1 each 2  . tiotropium (SPIRIVA) 18 MCG inhalation capsule Place 1 capsule (18 mcg total) into inhaler and inhale daily. (Patient taking differently: Place 18 mcg into inhaler and inhale daily as needed (shortness of breath). ) 30 capsule 1  . triamcinolone cream (KENALOG) 0.1 % Apply 1 application topically 3 (three) times daily as needed (eczema on arms). 30 g 1  . [DISCONTINUED] loratadine (CLARITIN) 10 MG tablet Take 1 tablet (10 mg total) by mouth daily. (Patient not taking: Reported on 06/14/2015) 30 tablet 11  . [DISCONTINUED] nortriptyline (PAMELOR) 25 MG capsule Take 1 capsule (25 mg total) by mouth at bedtime. Take 1 capsule (25 mg total) by mouth at bedtime for one week then increase to 2 capsules (50 mg total) by mouth at bedtime. (Patient taking differently: Take 50 mg by mouth at bedtime. Take 1 capsule (25 mg total) by mouth at bedtime for one week then increase to 2 capsules (50 mg total) by mouth at bedtime.) 53 capsule 0  . [DISCONTINUED] sertraline (ZOLOFT) 50 MG tablet Take 1 tablet (50 mg total)  by mouth daily. (Patient not  taking: Reported on 06/14/2015) 30 tablet 3    Objective: BP 130/73   Pulse 82   Temp 98 F (36.7 C) (Oral)   Resp 20   Ht 5' 2"  (1.575 m)   Wt 290 lb (131.5 kg)   SpO2 98%   BMI 53.04 kg/m  Exam: General: NAD, resting in bed Eyes: PERRL, EOMI ENTM: normal oropharynx Neck: supple, no JVD noted Cardiovascular: RRR, no m/r/g Respiratory: normal effort, CTAB Gastrointestinal: soft, mild tenderness in the right upper quadrant, without guarding or rebound, + bowel sounds MSK: able to move all extremities equally Derm: no rashes Neuro: CN2-12 intact, no focal deficits noted grossly Psych: appropriate mood and affect   Labs and Imaging: CBC BMET   Recent Labs Lab 07/02/17 0135  WBC 13.2*  HGB 14.2  HCT 43.4  PLT 307    Recent Labs Lab 07/02/17 0135  NA 137  K 4.2  CL 106  CO2 23  BUN 9  CREATININE 1.01*  GLUCOSE 124*  CALCIUM 8.9    EKG: NSR, no significant change from prior  I stat trop 0.00  CXR 07/02/17: The cardiomediastinal contours are normal. Mild chronic bronchial thickening. Pulmonary vasculature is normal. No consolidation, pleural effusion, or pneumothorax. No acute osseous abnormalities are seen.  IMPRESSION: Mild chronic bronchial thickening without acute abnormality.  Smiley Houseman, MD 07/02/2017, 3:44 AM PGY-3, [place] [facility] pager: [phone], text pages welcome

## 2017-07-02 NOTE — ED Notes (Signed)
Patient transported to X-ray 

## 2017-07-02 NOTE — ED Triage Notes (Signed)
Per EMS, pt from home. Pt c/o central cp that started around 2300 last night while she was in bed. Pt reports some sob. EMS gave 324 asa and 1 nitro which brought her pain from a 10 to 0. Pt c/o 10/10 headache from nitro. Pt had her gallbladder recently removed two weeks ago, in which she's had nausea and diarrhea. Hx of CHF and pulmonary edema. EMS VS BP 200/120 which went down to 140/100 after nitro, 90 HR, 98% RA, 125 cbg.

## 2017-07-02 NOTE — ED Notes (Signed)
Pt ambulatory to restroom

## 2017-07-02 NOTE — ED Notes (Signed)
Pt stated that she doesn't want to take nitro at this time for her cp. Pt states that it gives her a headache and wants to wait to take it. Pt reports cp is only at a 3/10.

## 2017-07-03 DIAGNOSIS — R079 Chest pain, unspecified: Secondary | ICD-10-CM | POA: Diagnosis not present

## 2017-07-09 DIAGNOSIS — I5033 Acute on chronic diastolic (congestive) heart failure: Secondary | ICD-10-CM | POA: Diagnosis not present

## 2017-07-09 DIAGNOSIS — R269 Unspecified abnormalities of gait and mobility: Secondary | ICD-10-CM | POA: Diagnosis not present

## 2017-07-09 DIAGNOSIS — G4733 Obstructive sleep apnea (adult) (pediatric): Secondary | ICD-10-CM | POA: Diagnosis not present

## 2017-07-24 DIAGNOSIS — R269 Unspecified abnormalities of gait and mobility: Secondary | ICD-10-CM | POA: Diagnosis not present

## 2017-07-24 DIAGNOSIS — I5033 Acute on chronic diastolic (congestive) heart failure: Secondary | ICD-10-CM | POA: Diagnosis not present

## 2017-07-24 DIAGNOSIS — G4733 Obstructive sleep apnea (adult) (pediatric): Secondary | ICD-10-CM | POA: Diagnosis not present

## 2017-07-27 DIAGNOSIS — I5033 Acute on chronic diastolic (congestive) heart failure: Secondary | ICD-10-CM | POA: Diagnosis not present

## 2017-07-27 DIAGNOSIS — R269 Unspecified abnormalities of gait and mobility: Secondary | ICD-10-CM | POA: Diagnosis not present

## 2017-08-08 DIAGNOSIS — R269 Unspecified abnormalities of gait and mobility: Secondary | ICD-10-CM | POA: Diagnosis not present

## 2017-08-08 DIAGNOSIS — I5033 Acute on chronic diastolic (congestive) heart failure: Secondary | ICD-10-CM | POA: Diagnosis not present

## 2017-08-08 DIAGNOSIS — G4733 Obstructive sleep apnea (adult) (pediatric): Secondary | ICD-10-CM | POA: Diagnosis not present

## 2017-08-19 ENCOUNTER — Other Ambulatory Visit: Payer: Self-pay | Admitting: Family Medicine

## 2017-08-20 NOTE — Progress Notes (Signed)
Subjective:   Patient ID: Hailey Barnes    DOB: 1961-03-30, 56 y.o. female   MRN: [id number]  CHARMA MOCARSKI is a 56 y.o. female with a history of HTN, DM2, CHF, obesity, tobacco use disorder here for   Diabetes, Type 2 - Last A1c 7.2 06/15/2017 - Medications: Metformin 1000 mg daily - Compliance: yes - Checking BG at home: no, monitor is broken right now - vision gets blurry when CBG is high. Wakes up with headache but attributes to OSA. - Diet: staying away from fried/greasy foods since cholecystectomy, eats baked meats. Doesn't do a lot of sugary/starchy. Eats a lot of steamed vegetables. - Exercise: Not able to do much due to chronic back pain. Can walk for a short period. - Eye exam: due - Foot exam: 2011, due - Denies symptoms of hypoglycemia, polyuria, polydipsia, foot ulcers/trauma. Takes fluid pills - getting [employer] to send another CBG monitor - has history of neuropathy  Boil - boil between vagina and inner thigh, couple weeks - Denies fevers, N/V - started out small and has grown. Hasn't tried anything for it. - draining blood this morning. - associated with vaginal itching. Denies burning with urination, frequency, abd pain.  Review of Systems:  Per HPI.   [place]: reviewed. Smoking status reviewed. Medications reviewed.  Objective:   BP (!) 150/92   Pulse 81   Temp 97.9 F (36.6 C) (Oral)   Ht 5' 2"  (1.575 m)   Wt 284 lb 6.4 oz (129 kg)   SpO2 95%   BMI 52.02 kg/m  Vitals and nursing note reviewed.  General: well nourished, well developed, in no acute distress with non-toxic appearance CV: regular rate and rhythm without murmurs, rubs, or gallops Lungs: clear to auscultation bilaterally with normal work of breathing GYN:  External genitalia with ~2-3 mm firm nonfluctuant lesion in the L groin between labia and inner thigh. Vaginal mucosa pink, moist, normal rugae. No abnormal discharge or bleeding noted on speculum exam.  Bimanual exam revealed normal,  nongravid uterus.  No adnexal masses bilaterally.   Skin: warm, dry, no rashes or lesions Extremities: warm and well perfused, normal tone. See diabetic foot exam. MSK: Full ROM, strength intact, gait normal Neuro: Alert and oriented, speech normal  Assessment & Plan:   T2DM (type 2 diabetes mellitus) (HCC) A1c 7.2 06/15/2017. On Metformin 1075m daily. Is not monitoring CBGs at home b/c current monitor is broken. Foot exam performed today with no deformities.  - continue current regimen.  - encouraged yearly eye exam - return in one month for A1c check.  Boil Present for the past couple weeks with increase in size and irritation per patient. Patient notes it bled when she "mashed on it." Has not noticed any other drainage. Exam indicative of boil previously drained, no areas of fluctuance but with increased induration ~2-3 mm. Also notes associated vaginal itching and irritation at the introitus. Wet prep done and unrevealing. RTC if doesn't resolve or gets worse. - sitz baths - bacitracin ointment - avoid irritation, should resolve in a couple weeks. RTC if no better.   Orders Placed This Encounter  Procedures  . MM Digital Screening    Standing Status:   Future    Standing Expiration Date:   10/21/2018    Order Specific Question:   Reason for Exam (SYMPTOM  OR DIAGNOSIS REQUIRED)    Answer:   screening for breast cancer    Order Specific Question:   Is the patient pregnant?  Answer:   No    Order Specific Question:   Preferred imaging location?    Answer:   [facility]  . POCT Wet Prep [facility])   Meds ordered this encounter  Medications  . neomycin-bacitracin-polymyxin (NEOSPORIN) ointment    Sig: Apply 1 application topically 2 (two) times daily. Apply to boil    Dispense:  15 g    Refill:  0    [name], DO PGY-1, [facility] 08/21/2017 5:03 PM

## 2017-08-21 ENCOUNTER — Ambulatory Visit (INDEPENDENT_AMBULATORY_CARE_PROVIDER_SITE_OTHER): Payer: Medicare HMO | Admitting: Family Medicine

## 2017-08-21 ENCOUNTER — Encounter: Payer: Self-pay | Admitting: Family Medicine

## 2017-08-21 VITALS — BP 150/92 | HR 81 | Temp 97.9°F | Ht 62.0 in | Wt 284.4 lb

## 2017-08-21 DIAGNOSIS — E119 Type 2 diabetes mellitus without complications: Secondary | ICD-10-CM | POA: Diagnosis not present

## 2017-08-21 DIAGNOSIS — Z1239 Encounter for other screening for malignant neoplasm of breast: Secondary | ICD-10-CM

## 2017-08-21 DIAGNOSIS — N898 Other specified noninflammatory disorders of vagina: Secondary | ICD-10-CM | POA: Diagnosis not present

## 2017-08-21 DIAGNOSIS — Z1231 Encounter for screening mammogram for malignant neoplasm of breast: Secondary | ICD-10-CM

## 2017-08-21 DIAGNOSIS — L0292 Furuncle, unspecified: Secondary | ICD-10-CM | POA: Diagnosis not present

## 2017-08-21 LAB — POCT WET PREP (WET MOUNT)
Clue Cells Wet Prep Whiff POC: NEGATIVE
Trichomonas Wet Prep HPF POC: ABSENT

## 2017-08-21 MED ORDER — BACITRACIN-NEOMYCIN-POLYMYXIN 400-5-5000 EX OINT: 1.0000 "application " | TOPICAL_OINTMENT | Freq: Two times a day (BID) | CUTANEOUS | 0 refills | Status: DC

## 2017-08-21 NOTE — Patient Instructions (Signed)
It was great to see you!  For your Diabetes,  - Continue taking Metformin 1070m once daily - Return in one month for an A1c check  For your boil, - It is likely an abscess that has drained and will continue to heal on its own. Try not to mash or press on it or irritate it. - You can try sitz baths with warm water and epsom salts to soothe the boil - You can use bacitracin ointment on it. - If it is no better in 2 weeks, return to the clinic for reevaluation.  Take care and seek immediate care sooner if you develop any concerns.   Hailey Percy DO [facility]   Diet Recommendations for Diabetes   1. Eat at least 3 meals and 1-2 snacks per day. Never go more than 4-5 hours while awake without eating. Eat breakfast within the first hour of getting up.   2. Limit starchy foods to TWO per meal and ONE per snack. ONE portion of a starchy  food is equal to the following:   - ONE slice of bread (or its equivalent, such as half of a hamburger bun).   - 1/2 cup of a "scoopable" starchy food such as potatoes or rice.   - 15 grams of Total Carbohydrate as shown on food label.  3. Include at every meal: a protein food, a carb food, and vegetables and/or fruit.   - Obtain twice the volume of vegetables as protein or carbohydrate foods for both lunch and dinner.   - Fresh or frozen vegetables are best.   - Keep frozen vegetables on hand for a quick vegetable serving.       Starchy (carb) foods: Bread, rice, pasta, potatoes, corn, cereal, grits, crackers, bagels, muffins, all baked goods.  (Fruits, milk, and yogurt also have carbohydrate, but most of these foods will not spike your blood sugar as most starchy foods will.)  A few fruits do cause high blood sugars; use small portions of bananas (limit to 1/2 at a time), grapes, watermelon, oranges, and most tropical fruits.    Protein foods: Meat, fish, poultry, eggs, dairy foods, and beans such as pinto and kidney beans (beans also  provide carbohydrate).     Here is an example of what a healthy plate looks like:    ? Make half your plate fruits and vegetables.     ? Focus on whole fruits.     ? Vary your veggies.  ? Make half your grains whole grains. -     ? Look for the word "whole" at the beginning of the ingredients list    ? Some whole-grain ingredients include whole oats, whole-wheat flour,        whole-grain corn, whole-grain brown rice, and whole rye.  ? Move to low-fat and fat-free milk or yogurt.  ? Vary your protein routine. - Meat, fish, poultry (chicken, [place], eggs, beans (kidney, pinto), dairy.  ? Drink and eat less sodium, saturated fat, and added sugars.

## 2017-08-21 NOTE — Assessment & Plan Note (Addendum)
A1c 7.2 06/15/2017. On Metformin 1050m daily. Is not monitoring CBGs at home b/c current monitor is broken. Foot exam performed today with no deformities.  - continue current regimen.  - encouraged yearly eye exam - return in one month for A1c check.

## 2017-08-21 NOTE — Assessment & Plan Note (Addendum)
Present for the past couple weeks with increase in size and irritation per patient. Patient notes it bled when she "mashed on it." Has not noticed any other drainage. Exam indicative of boil previously drained, no areas of fluctuance but with increased induration ~2-3 mm. Also notes associated vaginal itching and irritation at the introitus. Wet prep done and unrevealing. RTC if doesn't resolve or gets worse. - sitz baths - bacitracin ointment - avoid irritation, should resolve in a couple weeks. RTC if no better.

## 2017-08-24 ENCOUNTER — Other Ambulatory Visit: Payer: Self-pay | Admitting: Family Medicine

## 2017-08-24 MED ORDER — FLUCONAZOLE 150 MG PO TABS: 150.0000 mg | ORAL_TABLET | Freq: Once | ORAL | 0 refills | Status: AC

## 2017-08-25 ENCOUNTER — Other Ambulatory Visit: Payer: Self-pay | Admitting: Family Medicine

## 2017-08-25 DIAGNOSIS — Z1231 Encounter for screening mammogram for malignant neoplasm of breast: Secondary | ICD-10-CM

## 2017-08-26 ENCOUNTER — Encounter: Payer: Self-pay | Admitting: Family Medicine

## 2017-08-26 ENCOUNTER — Ambulatory Visit: Payer: Medicare HMO | Admitting: Family Medicine

## 2017-08-26 ENCOUNTER — Other Ambulatory Visit (HOSPITAL_COMMUNITY)
Admission: RE | Admit: 2017-08-26 | Discharge: 2017-08-26 | Disposition: A | Discharge status: Home / Self Care | Payer: Medicare HMO | Source: Ambulatory Visit | Attending: Family Medicine | Admitting: Family Medicine

## 2017-08-26 ENCOUNTER — Other Ambulatory Visit: Payer: Self-pay

## 2017-08-26 VITALS — BP 120/78 | HR 92 | Temp 97.7°F | Wt 282.0 lb

## 2017-08-26 DIAGNOSIS — Z7251 High risk heterosexual behavior: Secondary | ICD-10-CM | POA: Insufficient documentation

## 2017-08-26 DIAGNOSIS — R300 Dysuria: Secondary | ICD-10-CM | POA: Diagnosis not present

## 2017-08-26 DIAGNOSIS — N3000 Acute cystitis without hematuria: Secondary | ICD-10-CM

## 2017-08-26 LAB — POCT URINALYSIS DIP (MANUAL ENTRY)
BILIRUBIN UA: NEGATIVE
BILIRUBIN UA: NEGATIVE mg/dL
Blood, UA: NEGATIVE
GLUCOSE UA: NEGATIVE mg/dL
Nitrite, UA: NEGATIVE
Protein Ur, POC: NEGATIVE mg/dL
SPEC GRAV UA: 1.015 (ref 1.010–1.025)
Urobilinogen, UA: 0.2 E.U./dL
pH, UA: 6.5 (ref 5.0–8.0)

## 2017-08-26 LAB — POCT WET PREP (WET MOUNT)
CLUE CELLS WET PREP WHIFF POC: NEGATIVE
TRICHOMONAS WET PREP HPF POC: ABSENT

## 2017-08-26 LAB — POCT UA - MICROSCOPIC ONLY

## 2017-08-26 MED ORDER — NITROFURANTOIN MONOHYD MACRO 100 MG PO CAPS: 100.0000 mg | ORAL_CAPSULE | Freq: Two times a day (BID) | ORAL | 0 refills | Status: AC

## 2017-08-26 MED ORDER — FLUCONAZOLE 150 MG PO TABS: 150.0000 mg | ORAL_TABLET | Freq: Once | ORAL | 0 refills | Status: AC

## 2017-08-26 NOTE — Progress Notes (Signed)
Subjective:  Hailey Barnes is a 56 y.o. female who presents to the [facility] today with a chief complaint of dysuria.   HPI:  Dysuria - Thinks has UTI, similar to ones she has in had in the past - Came last week with itch on inside at outside, was told that was yeast with bacteria - Started having L sided lower abomdinal pain that has worening and going down to pubic area, felt like passing stone.  - Feels like the dull stabbing, intermittent for 10 min - Very painful to pee, better today  - Pressure to use but only a little bit comes out - Urgency and frequency  - No blood in urine - Slight discharge out of urethra - No fever/chills. No nausea/vomiting  Vaginal discharge - did diflucan x1 with some improvement - itching has improved  - is sexually active and does not have any known exposures but would like to be checked for all STDs today   ROS: Per HPI  Objective:  Physical Exam: BP 120/78   Pulse 92   Temp 97.7 F (36.5 C) (Oral)   Wt 282 lb (127.9 kg)   SpO2 95%   BMI 51.58 kg/m   Gen: NAD, resting comfortably CV: RRR with no murmurs appreciated Pulm: NWOB, CTAB with no crackles, wheezes, or rhonchi GI: Normal bowel sounds present. Soft, Nontender, Nondistended. No CVA tenderness. GU: Pelvic exam: normal external genitalia, vulva, vagina. Cervix not visualized. No CMT. MSK: no edema, cyanosis, or clubbing noted Skin: warm, dry Neuro: grossly normal, moves all extremities Psych: Normal affect and thought content  Results for orders placed or performed in visit on 08/26/17 (from the past 72 hour(s))  Cervicovaginal ancillary only     Status: None   Collection Time: 08/26/17 12:00 AM  Result Value Ref Range   Chlamydia Negative     Comment: Normal Reference Range - Negative   Neisseria gonorrhea Negative     Comment: Normal Reference Range - Negative  POCT urinalysis dipstick     Status: Abnormal   Collection Time: 08/26/17  2:25 PM  Result Value Ref Range   Color, UA yellow yellow   Clarity, UA clear clear   Glucose, UA negative negative mg/dL   Bilirubin, UA negative negative   Ketones, POC UA negative negative mg/dL   Spec Grav, UA 1.015 1.010 - 1.025   Blood, UA negative negative   pH, UA 6.5 5.0 - 8.0   Protein Ur, POC negative negative mg/dL   Urobilinogen, UA 0.2 0.2 or 1.0 E.U./dL   Nitrite, UA Negative Negative   Leukocytes, UA Trace (A) Negative  POCT UA - Microscopic Only     Status: None   Collection Time: 08/26/17  2:25 PM  Result Value Ref Range   WBC, Ur, HPF, POC RARE    RBC, urine, microscopic NONE    Bacteria, U Microscopic FEW    Epithelial cells, urine per micros 5-10   POCT Wet Prep [facility])     Status: Abnormal   Collection Time: 08/26/17  3:00 PM  Result Value Ref Range   Source Wet Prep POC VAG    WBC, Wet Prep HPF POC 1-5    Bacteria Wet Prep HPF POC Many (A) Few   Clue Cells Wet Prep HPF POC None None   Clue Cells Wet Prep Whiff POC Negative Whiff    Yeast Wet Prep HPF POC None    Trichomonas Wet Prep HPF POC Absent Absent  HIV  antibody     Status: None   Collection Time: 08/26/17  3:54 PM  Result Value Ref Range   HIV Screen 4th Generation wRfx Non Reactive Non Reactive  RPR     Status: None   Collection Time: 08/26/17  3:54 PM  Result Value Ref Range   RPR Ser Ql Non Reactive Non Reactive     Assessment/Plan:  Acute cystitis without hematuria Treat with macrobid for 5 days. Urine culture sent. Patient given rx for diflucan to take at the end of her antibiotic course in case she has resumption of her yeast infection symptoms.  High risk sexual behavior STD testing performed and neg results reviewed   Bufford Lope, DO PGY-2, [facility] 08/26/2017 2:41 PM

## 2017-08-26 NOTE — Patient Instructions (Signed)
It was good to see you today!  For your UTI  - Take antibiotic macrobid twice a day for 5 days - take Diflucan at the end of the antibiotics for yeast infection if you need it  We are checking some labs today. If results require attention, either myself or my nurse will get in touch with you. If everything is normal, you will get a letter in the mail or a message in My Chart. Please give [place] a call if you do not hear from [place] after 2 weeks.   Please bring all of your medications with you to each visit.   Sign up for My Chart to have easy access to your labs results, and communication with your primary care physician.  Feel free to call with any questions or concerns at any time, at [phone].   Take care,  Dr. Bufford Lope, DO [facility]   Urinary Tract Infection, Adult A urinary tract infection (UTI) is an infection of any part of the urinary tract. The urinary tract includes the:  Kidneys.  Ureters.  Bladder.  Urethra.  These organs make, store, and get rid of pee (urine) in the body. Follow these instructions at home:  Take over-the-counter and prescription medicines only as told by your doctor.  If you were prescribed an antibiotic medicine, take it as told by your doctor. Do not stop taking the antibiotic even if you start to feel better.  Avoid the following drinks: ? Alcohol. ? Caffeine. ? Tea. ? Carbonated drinks.  Drink enough fluid to keep your pee clear or pale yellow.  Keep all follow-up visits as told by your doctor. This is important.  Make sure to: ? Empty your bladder often and completely. Do not to hold pee for long periods of time. ? Empty your bladder before and after sex. ? Wipe from front to back after a bowel movement if you are female. Use each tissue one time when you wipe. Contact a doctor if:  You have back pain.  You have a fever.  You feel sick to your stomach (nauseous).  You throw up (vomit).  Your symptoms  do not get better after 3 days.  Your symptoms go away and then come back. Get help right away if:  You have very bad back pain.  You have very bad lower belly (abdominal) pain.  You are throwing up and cannot keep down any medicines or water. This information is not intended to replace advice given to you by your health care provider. Make sure you discuss any questions you have with your health care provider. Document Released: 03/24/2008 Document Revised: 03/13/2016 Document Reviewed: 08/27/2015 Elsevier Interactive Patient Education  [employer].

## 2017-08-27 ENCOUNTER — Encounter: Payer: Self-pay | Admitting: Family Medicine

## 2017-08-27 DIAGNOSIS — R269 Unspecified abnormalities of gait and mobility: Secondary | ICD-10-CM | POA: Diagnosis not present

## 2017-08-27 DIAGNOSIS — N3000 Acute cystitis without hematuria: Secondary | ICD-10-CM | POA: Insufficient documentation

## 2017-08-27 DIAGNOSIS — Z7251 High risk heterosexual behavior: Secondary | ICD-10-CM | POA: Insufficient documentation

## 2017-08-27 DIAGNOSIS — I5033 Acute on chronic diastolic (congestive) heart failure: Secondary | ICD-10-CM | POA: Diagnosis not present

## 2017-08-27 LAB — HIV ANTIBODY (ROUTINE TESTING W REFLEX): HIV Screen 4th Generation wRfx: NONREACTIVE

## 2017-08-27 LAB — RPR: RPR: NONREACTIVE

## 2017-08-27 LAB — CERVICOVAGINAL ANCILLARY ONLY
Chlamydia: NEGATIVE
Neisseria Gonorrhea: NEGATIVE

## 2017-08-27 NOTE — Assessment & Plan Note (Signed)
STD testing performed and neg results reviewed

## 2017-08-27 NOTE — Assessment & Plan Note (Signed)
Treat with macrobid for 5 days. Urine culture sent. Patient given rx for diflucan to take at the end of her antibiotic course in case she has resumption of her yeast infection symptoms.

## 2017-08-30 LAB — URINE CULTURE

## 2017-09-08 DIAGNOSIS — I5033 Acute on chronic diastolic (congestive) heart failure: Secondary | ICD-10-CM | POA: Diagnosis not present

## 2017-09-08 DIAGNOSIS — G4733 Obstructive sleep apnea (adult) (pediatric): Secondary | ICD-10-CM | POA: Diagnosis not present

## 2017-09-08 DIAGNOSIS — R269 Unspecified abnormalities of gait and mobility: Secondary | ICD-10-CM | POA: Diagnosis not present

## 2017-09-21 ENCOUNTER — Ambulatory Visit: Payer: Medicare HMO | Admitting: Internal Medicine

## 2017-09-21 ENCOUNTER — Other Ambulatory Visit: Payer: Self-pay

## 2017-09-21 ENCOUNTER — Encounter: Payer: Self-pay | Admitting: Internal Medicine

## 2017-09-21 ENCOUNTER — Other Ambulatory Visit (HOSPITAL_COMMUNITY)
Admission: RE | Admit: 2017-09-21 | Discharge: 2017-09-21 | Disposition: A | Discharge status: Home / Self Care | Payer: Medicare HMO | Source: Ambulatory Visit | Attending: Family Medicine | Admitting: Family Medicine

## 2017-09-21 VITALS — BP 158/92 | HR 88 | Temp 98.0°F | Wt 283.0 lb

## 2017-09-21 DIAGNOSIS — R300 Dysuria: Secondary | ICD-10-CM

## 2017-09-21 DIAGNOSIS — N898 Other specified noninflammatory disorders of vagina: Secondary | ICD-10-CM | POA: Insufficient documentation

## 2017-09-21 DIAGNOSIS — I10 Essential (primary) hypertension: Secondary | ICD-10-CM | POA: Diagnosis not present

## 2017-09-21 DIAGNOSIS — Z23 Encounter for immunization: Secondary | ICD-10-CM

## 2017-09-21 LAB — POCT URINALYSIS DIP (MANUAL ENTRY)
Bilirubin, UA: NEGATIVE
GLUCOSE UA: NEGATIVE mg/dL
Ketones, POC UA: NEGATIVE mg/dL
Leukocytes, UA: NEGATIVE
Nitrite, UA: NEGATIVE
PH UA: 6 (ref 5.0–8.0)
Protein Ur, POC: NEGATIVE mg/dL
RBC UA: NEGATIVE
SPEC GRAV UA: 1.02 (ref 1.010–1.025)
UROBILINOGEN UA: 0.2 U/dL

## 2017-09-21 LAB — POCT WET PREP (WET MOUNT)
Clue Cells Wet Prep Whiff POC: NEGATIVE
TRICHOMONAS WET PREP HPF POC: ABSENT

## 2017-09-21 MED ORDER — FLUCONAZOLE 150 MG PO TABS: 150.0000 mg | ORAL_TABLET | Freq: Once | ORAL | 0 refills | Status: AC

## 2017-09-21 NOTE — Patient Instructions (Addendum)
It was so nice to meet you!  Your testing has been negative so far. Because you're having itchiness, I have prescribed some Diflucan. Please take 1 tablet today and then you can take the 2nd tablet in 3 days if you are still having symptoms.  I will send your urine for culture to see if it grows any bacteria.   I will call you with the results of the rest of your lab work.  Please schedule an appointment with your primary care doctor to discuss your blood pressure. Make sure you take your BP medications before that appointment.  -Dr. Brett Albino

## 2017-09-21 NOTE — Progress Notes (Signed)
   [facility] Phone: [phone]  Subjective:  Hailey Barnes is a 56 year old female presenting to clinic with vaginal itching and dysuria for the last week.  Vaginal Itching: Has been going on for 1 week. She has been having clear vaginal discharge. She has tried using an over-the-counter monistat, which helped just a little bit. She is sexually active with one female partner. She does not use protection. Recently treated for vaginal candidiasis last month.  Dysuria: She notes occasional burning when she urinates and urinary urgency. No urinary frequency. No fevers. No suprapubic pain. She has mild occasional right sided flank pain. Recently treated 08/26/17 for UTI with Macrobid x 5 days. Urine culture grew pan-sensitive Enterococcus.  ROS: See HPI for pertinent positives and negatives  Past Medical History- HTN, chronic HFpEF, OSA, T2DM, HLD, depression, morbid obesity  Family history reviewed for today's visit. No changes.  Social history- patient is a current smoker  Objective: BP (!) 158/92   Pulse 88   Temp 98 F (36.7 C) (Oral)   Wt 283 lb (128.4 kg)   SpO2 96%   BMI 51.76 kg/m  Gen: NAD, alert, cooperative with exam GI: No suprapubic tenderness, no rebound, no guarding. Back: No CVA tenderness GU: External genitalia normal in appearance. No vaginal lesions. Vaginal walls normal. Moderate amount of white discharge present. Cervix normal in appearance. No cervical motion tenderness.  Assessment/Plan: Vaginal Itching: Wet prep was negative; however, given her itching and moderate amount of white discharge on exam, will treat clinically for vaginal candidiasis. - Diflucan 158m x 1, can take 2nd tablet in 3 days if symptoms have persisted. - Gonorrhea and chlamydia pending  Dysuria: UA without signs of infection. May be related to her vaginal itching. Recently treated with Macrobid for a UTI- culture grew pansensitive Enterococcus faecalis. - Will send  for urine culture - Follow-up if no improvement after treatment for vaginal candidiasis.   HTN: BP 158/92 in clinic today. Patient has not taken her BP meds this morning. - Follow-up with PCP as soon as possible    KHyman Bible MD PGY-3

## 2017-09-22 DIAGNOSIS — N898 Other specified noninflammatory disorders of vagina: Secondary | ICD-10-CM | POA: Insufficient documentation

## 2017-09-22 DIAGNOSIS — R300 Dysuria: Secondary | ICD-10-CM | POA: Insufficient documentation

## 2017-09-22 LAB — CERVICOVAGINAL ANCILLARY ONLY
CHLAMYDIA, DNA PROBE: NEGATIVE
Neisseria Gonorrhea: NEGATIVE

## 2017-09-22 NOTE — Assessment & Plan Note (Signed)
Wet prep was negative; however, given her itching and moderate amount of white discharge on exam, will treat clinically for vaginal candidiasis. - Diflucan 17m x 1, can take 2nd tablet in 3 days if symptoms have persisted. - Gonorrhea and chlamydia pending

## 2017-09-22 NOTE — Assessment & Plan Note (Signed)
UA without signs of infection. May be related to her vaginal itching. Recently treated with Macrobid for a UTI- culture grew pansensitive Enterococcus faecalis. - Will send for urine culture - Follow-up if no improvement after treatment for vaginal candidiasis.

## 2017-09-22 NOTE — Assessment & Plan Note (Signed)
BP 158/92 in clinic today. Patient has not taken her BP meds this morning. - Follow-up with PCP as soon as possible

## 2017-09-23 ENCOUNTER — Other Ambulatory Visit: Payer: Self-pay | Admitting: Internal Medicine

## 2017-09-23 ENCOUNTER — Telehealth: Payer: Self-pay | Admitting: Internal Medicine

## 2017-09-23 LAB — URINE CULTURE

## 2017-09-23 MED ORDER — CIPROFLOXACIN HCL 500 MG PO TABS: 500.0000 mg | ORAL_TABLET | Freq: Two times a day (BID) | ORAL | 0 refills | Status: DC

## 2017-09-23 NOTE — Telephone Encounter (Signed)
Called patient to discuss the results of her urine culture- she grew 50,000-100,000 colonies of pansensitive Enterococcus faecalis, which is the same bacteria she grew 08/26/17. She was treated with 5 days of Macrobid and states that she took the full course, but her dysuria returned. Will treat with Ciprofloxacin 51m bid x 5 days. Patient should follow-up if her symptoms do not resolve.  KHyman Bible MD PGY-3

## 2017-09-23 NOTE — Progress Notes (Signed)
Patient informed.  Jazmin Hartsell,CMA  

## 2017-09-24 ENCOUNTER — Ambulatory Visit
Admission: RE | Admit: 2017-09-24 | Discharge: 2017-09-24 | Disposition: A | Discharge status: Home / Self Care | Payer: Medicare HMO | Source: Ambulatory Visit | Attending: Family Medicine | Admitting: Family Medicine

## 2017-09-24 DIAGNOSIS — Z1231 Encounter for screening mammogram for malignant neoplasm of breast: Secondary | ICD-10-CM | POA: Diagnosis not present

## 2017-10-08 DIAGNOSIS — I5033 Acute on chronic diastolic (congestive) heart failure: Secondary | ICD-10-CM | POA: Diagnosis not present

## 2017-10-08 DIAGNOSIS — G4733 Obstructive sleep apnea (adult) (pediatric): Secondary | ICD-10-CM | POA: Diagnosis not present

## 2017-10-08 DIAGNOSIS — R269 Unspecified abnormalities of gait and mobility: Secondary | ICD-10-CM | POA: Diagnosis not present

## 2017-10-19 ENCOUNTER — Encounter: Payer: Self-pay | Admitting: Family Medicine

## 2017-10-19 ENCOUNTER — Ambulatory Visit: Payer: Medicare HMO | Admitting: Family Medicine

## 2017-10-19 VITALS — BP 138/88 | HR 90 | Ht 62.5 in | Wt 287.0 lb

## 2017-10-19 DIAGNOSIS — M654 Radial styloid tenosynovitis [de Quervain]: Secondary | ICD-10-CM | POA: Diagnosis not present

## 2017-10-19 DIAGNOSIS — G5603 Carpal tunnel syndrome, bilateral upper limbs: Secondary | ICD-10-CM

## 2017-10-19 DIAGNOSIS — M1712 Unilateral primary osteoarthritis, left knee: Secondary | ICD-10-CM | POA: Diagnosis not present

## 2017-10-19 DIAGNOSIS — M79641 Pain in right hand: Secondary | ICD-10-CM

## 2017-10-19 DIAGNOSIS — M79642 Pain in left hand: Secondary | ICD-10-CM

## 2017-10-19 MED ORDER — DICLOFENAC SODIUM 1 % TD GEL: 4.0000 g | Freq: Four times a day (QID) | TRANSDERMAL | 11 refills | Status: DC

## 2017-10-19 NOTE — Patient Instructions (Addendum)
Thank you for coming in today. Apply the gel to the knee and wrists up to 4x daily as needed.  Use the wrist brace with activity as needed.  Recheck with me in 2 weeks.   Call or go to the ER if you develop a large red swollen joint with extreme pain or oozing puss.   De Quervain Tenosynovitis Tendons attach muscles to bones. They also help with joint movements. When tendons become irritated or swollen, it is called tendinitis. The extensor pollicis brevis (EPB) tendon connects the EPB muscle to a bone that is near the base of the thumb. The EPB muscle helps to straighten and extend the thumb. De Quervain tenosynovitis is a condition in which the EPB tendon lining (sheath) becomes irritated, thickened, and swollen. This condition is sometimes called stenosing tenosynovitis. This condition causes pain on the thumb side of the back of the wrist. What are the causes? Causes of this condition include:  Activities that repeatedly cause your thumb and wrist to extend.  A sudden increase in activity or change in activity that affects your wrist.  What increases the risk? This condition is more likely to develop in:  Females.  People who have diabetes.  Women who have recently given birth.  People who are over 34 years of age.  People who do activities that involve repeated hand and wrist motions, such as tennis, racquetball, volleyball, gardening, and taking care of children.  People who do heavy labor.  People who have poor wrist strength and flexibility.  People who do not warm up properly before activities.  What are the signs or symptoms? Symptoms of this condition include:  Pain or tenderness over the thumb side of the back of the wrist when your thumb and wrist are not moving.  Pain that gets worse when you straighten your thumb or extend your thumb or wrist.  Pain when the injured area is touched.  Locking or catching of the thumb joint while you bend and straighten your  thumb.  Decreased thumb motion due to pain.  Swelling over the affected area.  How is this diagnosed? This condition is diagnosed with a medical history and physical exam. Your health care provider will ask for details about your injury and ask about your symptoms. How is this treated? Treatment may include the use of icing and medicines to reduce pain and swelling. You may also be advised to wear a splint or brace to limit your thumb and wrist motion. In less severe cases, treatment may also include working with a physical therapist to strengthen your wrist and calm the irritation around your EPB tendon sheath. In severe cases, surgery may be needed. Follow these instructions at home: If you have a splint or brace:  Wear it as told by your health care provider. Remove it only as told by your health care provider.  Loosen the splint or brace if your fingers become numb and tingle, or if they turn cold and blue.  Keep the splint or brace clean and dry. Managing pain, stiffness, and swelling  If directed, apply ice to the injured area. ? Put ice in a plastic bag. ? Place a towel between your skin and the bag. ? Leave the ice on for 20 minutes, 2-3 times per day.  Move your fingers often to avoid stiffness and to lessen swelling.  Raise (elevate) the injured area above the level of your heart while you are sitting or lying down. General instructions  Return to  your normal activities as told by your health care provider. Ask your health care provider what activities are safe for you.  Take over-the-counter and prescription medicines only as told by your health care provider.  Keep all follow-up visits as told by your health care provider. This is important.  Do not drive or operate heavy machinery while taking prescription pain medicine. Contact a health care provider if:  Your pain, tenderness, or swelling gets worse, even if you have had treatment.  You have numbness or  tingling in your wrist, hand, or fingers on the injured side. This information is not intended to replace advice given to you by your health care provider. Make sure you discuss any questions you have with your health care provider. Document Released: 10/06/2005 Document Revised: 03/13/2016 Document Reviewed: 12/12/2014 Elsevier Interactive Patient Education  [employer].

## 2017-10-21 DIAGNOSIS — G5603 Carpal tunnel syndrome, bilateral upper limbs: Secondary | ICD-10-CM | POA: Insufficient documentation

## 2017-10-21 NOTE — Progress Notes (Signed)
Hailey Barnes is a 57 y.o. female who presents to [facility] today for left knee pain and bilateral hand and wrist pain.  Left knee pain: Hailey Barnes notes pain in the left knee present for several months.  This is associated with pain and swelling.  She denies significant locking or catching but does note popping.  She is tried some over-the-counter medications for pain control which have helped.  She denies any fevers or chills or recent injury.  She feels well otherwise.  She notes the pain is interfering with her ability to work around the house normally.  Right wrist and hand pain.  Hailey Barnes notes pain in the base of her right thumb and along the radial aspect of her wrist.  This is worse with grip and wrist motion especially with ulnar and radial deviation of the wrist.  Additionally she notes some numbness and tingling into her thumb and index and middle fingers of her right hand.  This seems to be worse at night.  She notes the wrist pain is interfering with her ability to cook or clean at home.  She has not tried any treatment yet.  This is been ongoing for a few weeks.  Left wrist numbness: Hailey Barnes notes numbness and tingling into the first 3 digits of her left hand worse at night better during the day.  She denies weakness or loss of function.   Past Medical History:  Diagnosis Date  . Arthritis    "knees" (02/21/2014), back  . CHF (congestive heart failure) ([place])   . Chronic bronchitis ([place])    "get it q yr" (02/21/2014)  . Chronic diastolic CHF (congestive heart failure) ([place])    a. Echo 05/2014: EF 65-70% with Grade 1 DD.  Marland Kitchen Chronic lower back pain   . Complication of anesthesia    "I don't come out well; I chew on my tongue"  . COPD (chronic obstructive pulmonary disease) ([place])   . Diabetes mellitus without complication ([place])    TYPE 2  . Family history of adverse reaction to anesthesia    BROTHER PONV, had a blood clot several  days later and heart stopped  . Fatty liver    NONALCOLIC  . Fibroid   . Heart murmur   . Hypertension   . Infection of right eye    current dx on Saturday 7/15 - using drops  . Leg swelling    bilateral  . Migraine 1982-2009  . MVP (mitral valve prolapse)   . Neuromuscular disorder ([place])    right leg nerve pain  . Shortness of breath    WITH EXERTION  . Sinus pause    a. noted on telemetry during admission from 10/2016, lasting up to 4.4 seconds. BB discontinued.   . Sleep apnea 02/2016   CPAP 16 TO 20  . Stroke [facility]) noted on CAT 02/2014   "light", LLE weakness remains (03/30/2014)  . 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 Tingling in extremities 12/12/2010   Uric acid and electrolytes (02/23) normal but WBC elevated.   D/Dx: carpal tunnel, ulnar neuropathy Less likely: cervical radiculopathy, vasculitis    Past Surgical History:  Procedure Laterality Date  . [place]  . CHOLECYSTECTOMY N/A 06/17/2017   Procedure: LAPAROSCOPIC CHOLECYSTECTOMY WITH INTRAOPERATIVE CHOLANGIOGRAM;  Surgeon: Donnie Mesa, MD;  Location: [facility];  Service: General;  Laterality: N/A;  .  COLONOSCOPY WITH PROPOFOL N/A 04/28/2017   Procedure: COLONOSCOPY WITH PROPOFOL;  Surgeon: Manus Gunning, MD;  [facility];  Service: Gastroenterology;  Laterality: N/A;  . DILATATION & CURETTAGE/HYSTEROSCOPY WITH MYOSURE N/A 05/06/2016   Procedure: DILATATION & CURETTAGE/HYSTEROSCOPY WITH MYOSURE;  Surgeon: Terrance Mass, MD;  Location: [facility];  Service: Gynecology;  Laterality: N/A;  request to follow around 8:45  requests one hour OR time  . DILATATION & CURETTAGE/HYSTEROSCOPY WITH MYOSURE N/A 04/03/2017   Procedure: DILATATION & CURETTAGE/HYSTEROSCOPY WITH MYOSURE;  Surgeon: Terrance Mass, MD;  Location: [facility];  Service: Gynecology;  Laterality: N/A;  . DILATION AND CURETTAGE OF UTERUS    . ESOPHAGOGASTRODUODENOSCOPY  (EGD) WITH PROPOFOL N/A 04/28/2017   Procedure: ESOPHAGOGASTRODUODENOSCOPY (EGD) WITH PROPOFOL;  Surgeon: Manus Gunning, MD;  [facility];  Service: Gastroenterology;  Laterality: N/A;  . FOOT SURGERY Bilateral    "took bones out; put pins in"  . LEFT AND RIGHT HEART CATHETERIZATION WITH CORONARY ANGIOGRAM N/A 03/31/2014   Procedure: LEFT AND RIGHT HEART CATHETERIZATION WITH CORONARY ANGIOGRAM;  Surgeon: Birdie Riddle, MD;  Location: [facility];  Service: Cardiovascular;  Laterality: N/A;  . MYOMECTOMY  YRS AGO  . sonohystogram  04/14/2016   [facility]: intramural fibroid, premenopausal endometrium, right fluid filled tubular structure   Social History   Tobacco Use  . Smoking status: Current Every Day Smoker    Packs/day: 0.50    Years: 40.00    Pack years: 20.00    Types: Cigarettes  . Smokeless tobacco: Never Used  . Tobacco comment: Would like to quit  Substance Use Topics  . Alcohol use: No    Alcohol/week: 0.0 oz     ROS:  As above   Medications: Current Outpatient Medications  Medication Sig Dispense Refill  . acetaminophen (TYLENOL) 500 MG tablet Take 1 tablet (500 mg total) by mouth every 6 (six) hours as needed. (Patient taking differently: Take 500 mg by mouth every 6 (six) hours as needed for mild pain. ) 30 tablet 0  . albuterol (PROVENTIL HFA;VENTOLIN HFA) 108 (90 Base) MCG/ACT inhaler Inhale 2 puffs into the lungs every 6 (six) hours as needed for wheezing or shortness of breath.     Marland Kitchen aspirin 81 MG tablet Take 81 mg by mouth daily as needed (for chest pain).     Marland Kitchen atorvastatin (LIPITOR) 40 MG tablet TAKE ONE TABLET BY MOUTH ONCE DAILY (Patient taking differently: Take 40 mg by mouth once a day) 90 tablet 3  . Blood Glucose Monitoring Suppl (ACCU-CHEK AVIVA PLUS) w/Device KIT Use daily as needed to check blood sugar. 1 kit 0  . glucose blood (ACCU-CHEK AVIVA) test strip Use daily as needed to check blood sugar. 100 each  12  . hydrocortisone 1 % ointment APPLY ONE APPLICATION TOPICALLY TWO TIMES DAILY AS NEEDED (ECZEMA ON FACE) 29 g 1  . hydrOXYzine (ATARAX/VISTARIL) 50 MG tablet Take 50 mg by mouth at bedtime as needed (sleep).    Elmore Guise Devices (ACCU-CHEK SOFTCLIX) lancets Use daily as needed to check blood sugar. 1 each 11  . lisinopril (PRINIVIL,ZESTRIL) 20 MG tablet Take 1 tablet (20 mg total) by mouth daily. 90 tablet 3  . metFORMIN (GLUCOPHAGE) 1000 MG tablet TAKE ONE TABLET BY MOUTH TWICE DAILY WITH  A  MEAL 180 tablet 3  . mometasone-formoterol (DULERA) 200-5 MCG/ACT AERO Inhale 2 puffs into the lungs 2 (two) times daily as needed for wheezing or shortness of breath.     Marland Kitchen  pindolol (VISKEN) 5 MG tablet Take 1 tablet (5 mg total) by mouth 2 (two) times daily. (Patient taking differently: Take 5 mg by mouth daily. ) 180 tablet 3  . Potassium Chloride ER 20 MEQ TBCR TAKE 1 TABLET BY MOUTH ONCE DAILY (Patient taking differently: Take 20 mEq by mouth once a day) 30 tablet 11  . tiotropium (SPIRIVA) 18 MCG inhalation capsule Place 1 capsule (18 mcg total) into inhaler and inhale daily. (Patient taking differently: Place 18 mcg into inhaler and inhale daily as needed (shortness of breath). ) 30 capsule 1  . torsemide (DEMADEX) 20 MG tablet Take 3 tablets (60 mg total) by mouth daily. Take 3 tablets by mouth daily (Patient taking differently: Take 60 mg by mouth daily. ) 90 tablet 11  . triamcinolone cream (KENALOG) 0.1 % APPLY ONE APPLICATION TOPICALLY THREE TIMES DAILY AS NEEDED (ECZEMA ON ARMS) 30 g 1  . diclofenac sodium (VOLTAREN) 1 % GEL Apply 4 g topically 4 (four) times daily. To affected joint. 100 g 11   No current facility-administered medications for this visit.    No Known Allergies   Exam:  BP 138/88   Pulse 90   Ht 5' 2.5" (1.588 m)   Wt 287 lb (130.2 kg)   LMP 04/06/2017   BMI 51.66 kg/m  General: Well Developed, well nourished, and in no acute distress.  Neuro/Psych: Alert and oriented  x3, extra-ocular muscles intact, able to move all 4 extremities, sensation grossly intact. Skin: Warm and dry, no rashes noted.  Respiratory: Not using accessory muscles, speaking in full sentences, trachea midline.  Cardiovascular: Pulses palpable, no extremity edema. Abdomen: Does not appear distended. MSK:  Left knee: Minimal effusion no erythema. Tender to palpation along the patellar tendon and patella. Range of motion 0-120 degrees with significant retropatellar crepitations. Stable ligamentous exam. Negative McMurray's test. Intact flexion and extension strength.  Right hand normal-appearing with no thenar atrophy or erythema. Tender to palpation at first [facility]. Tender to palpation at the radial styloid. Wrist motion is normal but significant pain with radial and ulnar deviation. Positive Finkelstein's test. Positive Tinel's overlying the carpal tunnel. Positive Phalen's test. Grip strength pulses capillary refill and sensation are intact.  Left hand normal-appearing no thenar atrophy or erythema. Nontender. Normal wrist motion. Positive Tinel's test and Phalen's test.    EXAM: LEFT KNEE - COMPLETE 4+ VIEW  COMPARISON:  09/24/2010.  FINDINGS: Diffuse degenerative change. No acute bony or joint abnormality identified. No evidence of fracture or dislocation.  IMPRESSION: Diffuse degenerative change.  No acute bony or joint abnormality.   Electronically Signed   By: Marcello Moores  Register   On: 09/09/2016 14:42   Assessment and Plan: 57 y.o. female with  Left knee pain is due to DJD.  We discussed treatment options.  Plan for trial of diclofenac gel if not better will proceed with steroid injection.  Right hand and wrist pain is multifactorial: 1) pain at first [facility] is DJD based on appearance of x-ray from 2011.  Plan for trial of diclofenac gel and thumb spica splint and relative rest.  2) pain at radial styloid is de Quervain's tenosynovitis.  Plan for relative  rest and immobilization with thumb spica splint and diclofenac gel.  3) carpal tunnel syndrome explains the numbness and tingling into the first 3 digits of the right and left hand.  The right hand seems to be more symptomatic.  The thumb spica splint should help at night.    Recheck in a  few weeks.   Orders Placed This Encounter  Procedures  . DG Hand Complete Right    Standing Status:   Future    Standing Expiration Date:   12/18/2018    Order Specific Question:   Reason for Exam (SYMPTOM  OR DIAGNOSIS REQUIRED)    Answer:   eval pain 1st cmc    Order Specific Question:   Is patient pregnant?    Answer:   No    Order Specific Question:   Preferred imaging location?    Answer:   [facility]    Order Specific Question:   Radiology Contrast Protocol - do NOT remove file path    Answer:   [email or web address]  . DG Hand Complete Left    Standing Status:   Future    Standing Expiration Date:   12/18/2018    Order Specific Question:   Reason for Exam (SYMPTOM  OR DIAGNOSIS REQUIRED)    Answer:   eval pain 1st cmc    Order Specific Question:   Is patient pregnant?    Answer:   No    Order Specific Question:   Preferred imaging location?    Answer:   [facility]    Order Specific Question:   Radiology Contrast Protocol - do NOT remove file path    Answer:   [email or web address]   Meds ordered this encounter  Medications  . diclofenac sodium (VOLTAREN) 1 % GEL    Sig: Apply 4 g topically 4 (four) times daily. To affected joint.    Dispense:  100 g    Refill:  11    Discussed warning signs or symptoms. Please see discharge instructions. Patient expresses understanding.

## 2017-10-23 ENCOUNTER — Encounter: Payer: Self-pay | Admitting: Gastroenterology

## 2017-11-02 ENCOUNTER — Ambulatory Visit (INDEPENDENT_AMBULATORY_CARE_PROVIDER_SITE_OTHER): Payer: Medicare HMO | Admitting: Family Medicine

## 2017-11-02 ENCOUNTER — Ambulatory Visit: Payer: Medicare HMO

## 2017-11-02 ENCOUNTER — Ambulatory Visit (INDEPENDENT_AMBULATORY_CARE_PROVIDER_SITE_OTHER): Payer: Medicare HMO

## 2017-11-02 ENCOUNTER — Encounter: Payer: Self-pay | Admitting: Family Medicine

## 2017-11-02 VITALS — BP 156/95 | HR 80 | Ht 62.5 in | Wt 289.0 lb

## 2017-11-02 DIAGNOSIS — M79641 Pain in right hand: Secondary | ICD-10-CM | POA: Diagnosis not present

## 2017-11-02 DIAGNOSIS — M79642 Pain in left hand: Secondary | ICD-10-CM | POA: Diagnosis not present

## 2017-11-02 DIAGNOSIS — M19042 Primary osteoarthritis, left hand: Secondary | ICD-10-CM

## 2017-11-02 DIAGNOSIS — M19041 Primary osteoarthritis, right hand: Secondary | ICD-10-CM

## 2017-11-02 DIAGNOSIS — M654 Radial styloid tenosynovitis [de Quervain]: Secondary | ICD-10-CM

## 2017-11-02 NOTE — Progress Notes (Signed)
Hailey Barnes is a 57 y.o. female who presents to [facility] today for right wrist pain.   Hailey Barnes was seen about 2 weeks ago for right wrist pain and left knee pain.   She notes considerable improvement in knee pain with diclofenac gel.  She has not had much improvement in the right wrist pain with bracing and diclofenac gel.  She notes the pain is predominantly felt at the radial styloid and is worse with ulnar deviation.  She also notes mild numbness in the first 3 fingers of her right hand.  Additionally she has some pain at the base of her thumb at the right and left hand.  She notes diclofenac gel has not helped these issues as well the pain is obnoxious and interferes with her quality of life and ability to complete tasks at home.   Past Medical History:  Diagnosis Date  . Arthritis    "knees" (02/21/2014), back  . CHF (congestive heart failure) ([place])   . Chronic bronchitis ([place])    "get it q yr" (02/21/2014)  . Chronic diastolic CHF (congestive heart failure) ([place])    a. Echo 05/2014: EF 65-70% with Grade 1 DD.  Marland Kitchen Chronic lower back pain   . Complication of anesthesia    "I don't come out well; I chew on my tongue"  . COPD (chronic obstructive pulmonary disease) ([place])   . Diabetes mellitus without complication ([place])    TYPE 2  . Family history of adverse reaction to anesthesia    BROTHER PONV, had a blood clot several days later and heart stopped  . Fatty liver    NONALCOLIC  . Fibroid   . Heart murmur   . Hypertension   . Infection of right eye    current dx on Saturday 7/15 - using drops  . Leg swelling    bilateral  . Migraine 1982-2009  . MVP (mitral valve prolapse)   . Neuromuscular disorder ([place])    right leg nerve pain  . Shortness of breath    WITH EXERTION  . Sinus pause    a. noted on telemetry during admission from 10/2016, lasting up to 4.4 seconds. BB discontinued.   . Sleep apnea 02/2016   CPAP 16 TO 20    . Stroke [facility]) noted on CAT 02/2014   "light", LLE weakness remains (03/30/2014)  . 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 Tingling in extremities 12/12/2010   Uric acid and electrolytes (02/23) normal but WBC elevated.   D/Dx: carpal tunnel, ulnar neuropathy Less likely: cervical radiculopathy, vasculitis    Past Surgical History:  Procedure Laterality Date  . [place]  . CHOLECYSTECTOMY N/A 06/17/2017   Procedure: LAPAROSCOPIC CHOLECYSTECTOMY WITH INTRAOPERATIVE CHOLANGIOGRAM;  Surgeon: Donnie Mesa, MD;  Location: [facility];  Service: General;  Laterality: N/A;  . COLONOSCOPY WITH PROPOFOL N/A 04/28/2017   Procedure: COLONOSCOPY WITH PROPOFOL;  Surgeon: Manus Gunning, MD;  [facility];  Service: Gastroenterology;  Laterality: N/A;  . DILATATION & CURETTAGE/HYSTEROSCOPY WITH MYOSURE N/A 05/06/2016   Procedure: DILATATION & CURETTAGE/HYSTEROSCOPY WITH MYOSURE;  Surgeon: Terrance Mass, MD;  Location: [facility];  Service: Gynecology;  Laterality: N/A;  request to follow around 8:45  requests one hour OR time  . DILATATION & CURETTAGE/HYSTEROSCOPY WITH MYOSURE N/A 04/03/2017   Procedure: DILATATION & CURETTAGE/HYSTEROSCOPY WITH MYOSURE;  Surgeon: Terrance Mass, MD;  Location: [facility];  Service: Gynecology;  Laterality: N/A;  . DILATION AND CURETTAGE OF UTERUS    . ESOPHAGOGASTRODUODENOSCOPY (EGD) WITH PROPOFOL N/A 04/28/2017   Procedure: ESOPHAGOGASTRODUODENOSCOPY (EGD) WITH PROPOFOL;  Surgeon: Manus Gunning, MD;  [facility];  Service: Gastroenterology;  Laterality: N/A;  . FOOT SURGERY Bilateral    "took bones out; put pins in"  . LEFT AND RIGHT HEART CATHETERIZATION WITH CORONARY ANGIOGRAM N/A 03/31/2014   Procedure: LEFT AND RIGHT HEART CATHETERIZATION WITH CORONARY ANGIOGRAM;  Surgeon: Birdie Riddle, MD;  Location: [facility];  Service: Cardiovascular;  Laterality: N/A;  .  MYOMECTOMY  YRS AGO  . sonohystogram  04/14/2016   [facility]: intramural fibroid, premenopausal endometrium, right fluid filled tubular structure   Social History   Tobacco Use  . Smoking status: Current Every Day Smoker    Packs/day: 0.50    Years: 40.00    Pack years: 20.00    Types: Cigarettes  . Smokeless tobacco: Never Used  . Tobacco comment: Would like to quit  Substance Use Topics  . Alcohol use: No    Alcohol/week: 0.0 oz     ROS:  As above   Medications: Current Outpatient Medications  Medication Sig Dispense Refill  . acetaminophen (TYLENOL) 500 MG tablet Take 1 tablet (500 mg total) by mouth every 6 (six) hours as needed. (Patient taking differently: Take 500 mg by mouth every 6 (six) hours as needed for mild pain. ) 30 tablet 0  . albuterol (PROVENTIL HFA;VENTOLIN HFA) 108 (90 Base) MCG/ACT inhaler Inhale 2 puffs into the lungs every 6 (six) hours as needed for wheezing or shortness of breath.     Marland Kitchen aspirin 81 MG tablet Take 81 mg by mouth daily as needed (for chest pain).     Marland Kitchen atorvastatin (LIPITOR) 40 MG tablet TAKE ONE TABLET BY MOUTH ONCE DAILY (Patient taking differently: Take 40 mg by mouth once a day) 90 tablet 3  . Blood Glucose Monitoring Suppl (ACCU-CHEK AVIVA PLUS) w/Device KIT Use daily as needed to check blood sugar. 1 kit 0  . diclofenac sodium (VOLTAREN) 1 % GEL Apply 4 g topically 4 (four) times daily. To affected joint. 100 g 11  . glucose blood (ACCU-CHEK AVIVA) test strip Use daily as needed to check blood sugar. 100 each 12  . hydrocortisone 1 % ointment APPLY ONE APPLICATION TOPICALLY TWO TIMES DAILY AS NEEDED (ECZEMA ON FACE) 29 g 1  . hydrOXYzine (ATARAX/VISTARIL) 50 MG tablet Take 50 mg by mouth at bedtime as needed (sleep).    Elmore Guise Devices (ACCU-CHEK SOFTCLIX) lancets Use daily as needed to check blood sugar. 1 each 11  . lisinopril (PRINIVIL,ZESTRIL) 20 MG tablet Take 1 tablet (20 mg total) by mouth daily. 90  tablet 3  . metFORMIN (GLUCOPHAGE) 1000 MG tablet TAKE ONE TABLET BY MOUTH TWICE DAILY WITH  A  MEAL 180 tablet 3  . mometasone-formoterol (DULERA) 200-5 MCG/ACT AERO Inhale 2 puffs into the lungs 2 (two) times daily as needed for wheezing or shortness of breath.     . pindolol (VISKEN) 5 MG tablet Take 1 tablet (5 mg total) by mouth 2 (two) times daily. (Patient taking differently: Take 5 mg by mouth daily. ) 180 tablet 3  . Potassium Chloride ER 20 MEQ TBCR TAKE 1 TABLET BY MOUTH ONCE DAILY (Patient taking differently: Take 20 mEq by mouth once a day) 30 tablet 11  . tiotropium (SPIRIVA) 18 MCG inhalation capsule Place 1 capsule (18  mcg total) into inhaler and inhale daily. (Patient taking differently: Place 18 mcg into inhaler and inhale daily as needed (shortness of breath). ) 30 capsule 1  . torsemide (DEMADEX) 20 MG tablet Take 3 tablets (60 mg total) by mouth daily. Take 3 tablets by mouth daily (Patient taking differently: Take 60 mg by mouth daily. ) 90 tablet 11  . triamcinolone cream (KENALOG) 0.1 % APPLY ONE APPLICATION TOPICALLY THREE TIMES DAILY AS NEEDED (ECZEMA ON ARMS) 30 g 1   No current facility-administered medications for this visit.    No Known Allergies   Exam:  BP (!) 156/95   Pulse 80   Ht 5' 2.5" (1.588 m)   Wt 289 lb (131.1 kg)   LMP 04/06/2017   BMI 52.02 kg/m  General: Well Developed, well nourished, and in no acute distress.  Neuro/Psych: Alert and oriented x3, extra-ocular muscles intact, able to move all 4 extremities, sensation grossly intact. Skin: Warm and dry, no rashes noted.  Respiratory: Not using accessory muscles, speaking in full sentences, trachea midline.  Cardiovascular: Pulses palpable, no extremity edema. Abdomen: Does not appear distended. MSK:  Right wrist normal-appearing.  Tender to palpation radial styloid thenar eminence and first [place].  Positive Finkelstein's test.  Motion and strength is intact.  Mildly positive Tinel's test.  Left  hand normal-appearing.  Tender to palpation thenar eminence and first [facility]   Procedure: Real-time Ultrasound Guided Injection of right 1st dorsal wrist compartment.  Tennis Must Quervain's injection) Device: GE Logiq E  Images permanently stored and available for review in the ultrasound unit. Verbal informed consent obtained. Discussed risks and benefits of procedure. Warned about infection bleeding damage to structures skin hypopigmentation and fat atrophy among others. Patient expresses understanding and agreement Time-out conducted.  Noted no overlying erythema, induration, or other signs of local infection.  Skin prepped in a sterile fashion.  Local anesthesia: Topical Ethyl chloride.  With sterile technique and under real time ultrasound guidance: 0.47m marcaine and 526mdexamethasone injected easily.  Completed without difficulty  Pain partially resolved suggesting accurate placement of the medication.  Advised to call if fevers/chills, erythema, induration, drainage, or persistent bleeding.  Images permanently stored and available for review in the ultrasound unit.  Impression: Technically successful ultrasound guided injection.     No results found for this or any previous visit (from the past 48 hour(s)). Dg Hand Complete Left  Result Date: 11/02/2017 CLINICAL DATA:  Pain at the base of the thumbs EXAM: LEFT HAND - COMPLETE 3+ VIEW COMPARISON:  None. FINDINGS: No fracture or malalignment. Mild degenerative changes at the first CMM[facility]MCP and PIP joint. Mild narrowing and spurring at the second through fifth DIP joints. 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 No acute osseous abnormality. Electronically Signed   By: KiDonavan Foil.D.   On: 11/02/2017 15:22   Dg Hand Complete Right  Result Date: 11/02/2017 CLINICAL DATA:  Pain at the base of the thumbs for 2 months with swelling EXAM: RIGHT HAND - COMPLETE 3+ VIEW COMPARISON:  None.  FINDINGS: No fracture or malalignment. Mild degenerative change at the first PIP and MCP joints. Mild degenerative change of the second through fifth DIP joints. Minimal degenerative change at the first [facility]. IMPRESSION: Mild arthritic changes involving the thumb and DIP joints of the second through fifth digits. No acute osseous abnormality Electronically Signed   By: KiDonavan Foil.D.   On: 11/02/2017 15:21  Assessment and Plan: 57 y.o. female with  Right Wrist Pain: Multifactorial: 1) de Quervain's tenosynovitis: Is post injection today after failing conservative trial of bracing and diclofenac gel.  Resume bracing and relative rest recheck in 2 weeks.  2) first [place] DJD we will continue bracing and diclofenac gel if not better likely will inject this area next.  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  If not better with bracing next step would probably be injection.  Check soon   Left wrist pain likely from her [place] DJD.  Injection if not better with relative rest and diclofenac gel.    No orders of the defined types were placed in this encounter.  No orders of the defined types were placed in this encounter.   Discussed warning signs or symptoms. Please see discharge instructions. Patient expresses understanding.

## 2017-11-02 NOTE — Patient Instructions (Signed)
Thank you for coming in today. Get hand xray today.  Continue brace and gel.  Recheck with me in 2-3 weeks or sooner if needed.   De Quervain Tenosynovitis Tendons attach muscles to bones. They also help with joint movements. When tendons become irritated or swollen, it is called tendinitis. The extensor pollicis brevis (EPB) tendon connects the EPB muscle to a bone that is near the base of the thumb. The EPB muscle helps to straighten and extend the thumb. De Quervain tenosynovitis is a condition in which the EPB tendon lining (sheath) becomes irritated, thickened, and swollen. This condition is sometimes called stenosing tenosynovitis. This condition causes pain on the thumb side of the back of the wrist. What are the causes? Causes of this condition include:  Activities that repeatedly cause your thumb and wrist to extend.  A sudden increase in activity or change in activity that affects your wrist.  What increases the risk? This condition is more likely to develop in:  Females.  People who have diabetes.  Women who have recently given birth.  People who are over 19 years of age.  People who do activities that involve repeated hand and wrist motions, such as tennis, racquetball, volleyball, gardening, and taking care of children.  People who do heavy labor.  People who have poor wrist strength and flexibility.  People who do not warm up properly before activities.  What are the signs or symptoms? Symptoms of this condition include:  Pain or tenderness over the thumb side of the back of the wrist when your thumb and wrist are not moving.  Pain that gets worse when you straighten your thumb or extend your thumb or wrist.  Pain when the injured area is touched.  Locking or catching of the thumb joint while you bend and straighten your thumb.  Decreased thumb motion due to pain.  Swelling over the affected area.  How is this diagnosed? This condition is diagnosed with  a medical history and physical exam. Your health care provider will ask for details about your injury and ask about your symptoms. How is this treated? Treatment may include the use of icing and medicines to reduce pain and swelling. You may also be advised to wear a splint or brace to limit your thumb and wrist motion. In less severe cases, treatment may also include working with a physical therapist to strengthen your wrist and calm the irritation around your EPB tendon sheath. In severe cases, surgery may be needed. Follow these instructions at home: If you have a splint or brace:  Wear it as told by your health care provider. Remove it only as told by your health care provider.  Loosen the splint or brace if your fingers become numb and tingle, or if they turn cold and blue.  Keep the splint or brace clean and dry. Managing pain, stiffness, and swelling  If directed, apply ice to the injured area. ? Put ice in a plastic bag. ? Place a towel between your skin and the bag. ? Leave the ice on for 20 minutes, 2-3 times per day.  Move your fingers often to avoid stiffness and to lessen swelling.  Raise (elevate) the injured area above the level of your heart while you are sitting or lying down. General instructions  Return to your normal activities as told by your health care provider. Ask your health care provider what activities are safe for you.  Take over-the-counter and prescription medicines only as told by your  health care provider.  Keep all follow-up visits as told by your health care provider. This is important.  Do not drive or operate heavy machinery while taking prescription pain medicine. Contact a health care provider if:  Your pain, tenderness, or swelling gets worse, even if you have had treatment.  You have numbness or tingling in your wrist, hand, or fingers on the injured side. This information is not intended to replace advice given to you by your health care  provider. Make sure you discuss any questions you have with your health care provider. Document Released: 10/06/2005 Document Revised: 03/13/2016 Document Reviewed: 12/12/2014 Elsevier Interactive Patient Education  [employer].

## 2017-11-05 DIAGNOSIS — E119 Type 2 diabetes mellitus without complications: Secondary | ICD-10-CM | POA: Diagnosis not present

## 2017-11-05 DIAGNOSIS — Z7984 Long term (current) use of oral hypoglycemic drugs: Secondary | ICD-10-CM | POA: Diagnosis not present

## 2017-11-05 DIAGNOSIS — I10 Essential (primary) hypertension: Secondary | ICD-10-CM | POA: Diagnosis not present

## 2017-11-05 DIAGNOSIS — H52223 Regular astigmatism, bilateral: Secondary | ICD-10-CM | POA: Diagnosis not present

## 2017-11-05 DIAGNOSIS — H524 Presbyopia: Secondary | ICD-10-CM | POA: Diagnosis not present

## 2017-11-05 LAB — HM DIABETES EYE EXAM

## 2017-11-08 DIAGNOSIS — G4733 Obstructive sleep apnea (adult) (pediatric): Secondary | ICD-10-CM | POA: Diagnosis not present

## 2017-11-08 DIAGNOSIS — R269 Unspecified abnormalities of gait and mobility: Secondary | ICD-10-CM | POA: Diagnosis not present

## 2017-11-08 DIAGNOSIS — I5033 Acute on chronic diastolic (congestive) heart failure: Secondary | ICD-10-CM | POA: Diagnosis not present

## 2017-11-16 ENCOUNTER — Ambulatory Visit (INDEPENDENT_AMBULATORY_CARE_PROVIDER_SITE_OTHER): Payer: Medicare HMO | Admitting: Sports Medicine

## 2017-11-16 ENCOUNTER — Encounter: Payer: Self-pay | Admitting: Sports Medicine

## 2017-11-16 DIAGNOSIS — M180 Bilateral primary osteoarthritis of first carpometacarpal joints: Secondary | ICD-10-CM

## 2017-11-16 DIAGNOSIS — M654 Radial styloid tenosynovitis [de Quervain]: Secondary | ICD-10-CM | POA: Diagnosis not present

## 2017-11-16 MED ORDER — IBUPROFEN 600 MG PO TABS: 600.0000 mg | ORAL_TABLET | Freq: Three times a day (TID) | ORAL | 0 refills | Status: DC | PRN

## 2017-11-16 NOTE — Assessment & Plan Note (Signed)
She has multiple probable pain generators on the right. CMC joint, wrist joint. First extensor compartment was the most tender today, I have repeated her injection, half dose into the first extensor compartment and she will return to her thumb spica brace. Return to see me in a month.

## 2017-11-16 NOTE — Assessment & Plan Note (Addendum)
Left CMC injection. Continue brace on the right. Adding ibuprofen 600, she will monitor her weights with history of CHF. Return to see either myself or Dr. [name] in 1 month.

## 2017-11-16 NOTE — Progress Notes (Addendum)
Subjective:    I'm seeing this patient as a consultation for:  Lynne Leader, MD  CC: Bilateral wrist pain  HPI: Hailey Barnes is a 57 y.o. female who presents to [facility] today for bilateral wrist pain.  R. Wrist:  Patient failed conservative therapy with Voltaren gel and wrist splinting for right de quervain's tenosynovitis. Patient then underwent injection of right wrist on 1/14. Patient had 1 day of relief of pain but pain returned thereafter. Patient has continued to wear brace and use Voltaren gel. Patient endorsing significant pain in and out of brace, though brace has helped. 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  L. Wrist: Patient with worsening left wrist pain, now that she has been using left hand more. States that pain is severe with any use of thumb. Pain especially worse with abduction. Patient has been using Voltaren gel on left side as well with minimal relief. DJD noted on Xray from prior visit.  Past medical history, Surgical history, Family history not pertinant except as noted below, Social history, Allergies, and medications have been entered into the medical record, reviewed, and no changes needed.   Review of Systems: No headache, visual changes, nausea, vomiting, diarrhea, constipation, dizziness, abdominal pain, skin rash, fevers, chills, night sweats, weight loss, swollen lymph nodes, body aches, joint swelling, muscle aches, chest pain, shortness of breath, mood changes, visual or auditory hallucinations.   Objective:    Vitals:   11/16/17 1034 11/16/17 1102  BP: (!) 158/85 (!) 142/84  Pulse: 80   SpO2: 98%    General: Well Developed, well nourished, and in no acute distress.  Neuro/Psych: Alert and oriented x3, extra-ocular muscles intact, able to move all 4 extremities, sensation grossly intact. Skin: Warm and dry, no rashes noted.  Respiratory: Not using accessory muscles, speaking in full sentences, trachea  midline.  Cardiovascular: Pulses palpable, no extremity edema. Abdomen: Does not appear distended. MSK:  R. Wrist: Minimal swelling with no overlying erythema. Tender to palpation over APL and EPB tendon. Significantly tender to palpation over [place]. Finkelstein positive.  L. Wrist: Normal in appearance with no overlying erythema. No tenderness over APL, EPB tendon, radial styloid thenar eminence. Severe pain over CMC, especially with forced abduction of thumb. Finkelstein negative.  PAST IMAGINg RESULTS: Dg Hand Complete Left  Result Date: 11/02/2017 CLINICAL DATA:  Pain at the base of the thumbs EXAM: LEFT HAND - COMPLETE 3+ VIEW COMPARISON:  None. FINDINGS: No fracture or malalignment. Mild degenerative changes at the first [place], MCP and PIP joint. Mild narrowing and spurring at the second through fifth DIP joints. 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 No acute osseous abnormality. Electronically Signed   By: Donavan Foil M.D.   On: 11/02/2017 15:22   Dg Hand Complete Right  Result Date: 11/02/2017 CLINICAL DATA:  Pain at the base of the thumbs for 2 months with swelling EXAM: RIGHT HAND - COMPLETE 3+ VIEW COMPARISON:  None. FINDINGS: No fracture or malalignment. Mild degenerative change at the first PIP and MCP joints. Mild degenerative change of the second through fifth DIP joints. Minimal degenerative change at the first [facility] joint. IMPRESSION: Mild arthritic changes involving the thumb and DIP joints of the second through fifth digits. No acute osseous abnormality Electronically Signed   By: Donavan Foil M.D.   On: 11/02/2017 15:21    No results found for this or any previous visit (from the past 24  hour(s)). No results found.  Impression and Recommendations:    Assessment and Plan: 57 y.o. female with bilateral wrist pain. Patient's presentation most consistent with right de quervain's tenosynovitis and left CMC arthritis.   R  Wrist: Consistent with R. De Quervain's. Performed repeat steroid injection of the extensor compartment. Patient should continue bracing and topical gel for right wrist. In addition, we will prescribe oral NSAID to be taken as needed for pain.   L wrist: Consistent with CMC arthritis. Performed steroid injection of left CMC today. Patient should continue with topical Voltaren. She will also benefit from oral ibuprofen. Patient did feel significant relief immediately following injection.  Patient instructed to follow weights to ensure she is not holding onto fluid given CHF and ibuprofen. She should stop ibuprofen if gaining significant weight.  She should follow up in 4 weeks.  No orders of the defined types were placed in this encounter.  Meds ordered this encounter  Medications  . ibuprofen (ADVIL,MOTRIN) 600 MG tablet    Sig: Take 1 tablet (600 mg total) by mouth every 8 (eight) hours as needed.    Dispense:  30 tablet    Refill:  0    Discussed warning signs or symptoms. Please see discharge instructions. Patient expresses understanding.

## 2017-11-20 DIAGNOSIS — I5033 Acute on chronic diastolic (congestive) heart failure: Secondary | ICD-10-CM | POA: Diagnosis not present

## 2017-11-20 DIAGNOSIS — R269 Unspecified abnormalities of gait and mobility: Secondary | ICD-10-CM | POA: Diagnosis not present

## 2017-11-20 DIAGNOSIS — G4733 Obstructive sleep apnea (adult) (pediatric): Secondary | ICD-10-CM | POA: Diagnosis not present

## 2017-11-23 ENCOUNTER — Ambulatory Visit: Payer: Medicare HMO | Admitting: Family Medicine

## 2017-11-27 ENCOUNTER — Other Ambulatory Visit: Payer: Self-pay

## 2017-11-27 ENCOUNTER — Other Ambulatory Visit (HOSPITAL_COMMUNITY)
Admission: RE | Admit: 2017-11-27 | Discharge: 2017-11-27 | Disposition: A | Discharge status: Home / Self Care | Payer: Medicare HMO | Source: Ambulatory Visit | Attending: Family Medicine | Admitting: Family Medicine

## 2017-11-27 ENCOUNTER — Ambulatory Visit (INDEPENDENT_AMBULATORY_CARE_PROVIDER_SITE_OTHER): Payer: Medicare HMO | Admitting: Family Medicine

## 2017-11-27 VITALS — BP 134/100 | HR 79 | Temp 98.3°F | Wt 284.2 lb

## 2017-11-27 DIAGNOSIS — N898 Other specified noninflammatory disorders of vagina: Secondary | ICD-10-CM | POA: Diagnosis not present

## 2017-11-27 DIAGNOSIS — R300 Dysuria: Secondary | ICD-10-CM

## 2017-11-27 DIAGNOSIS — I10 Essential (primary) hypertension: Secondary | ICD-10-CM | POA: Diagnosis not present

## 2017-11-27 LAB — POCT UA - MICROSCOPIC ONLY

## 2017-11-27 LAB — POCT URINALYSIS DIP (MANUAL ENTRY)
BILIRUBIN UA: NEGATIVE
BILIRUBIN UA: NEGATIVE mg/dL
Glucose, UA: NEGATIVE mg/dL
Leukocytes, UA: NEGATIVE
Nitrite, UA: NEGATIVE
PH UA: 5.5 (ref 5.0–8.0)
Protein Ur, POC: NEGATIVE mg/dL
SPEC GRAV UA: 1.01 (ref 1.010–1.025)
Urobilinogen, UA: 0.2 E.U./dL

## 2017-11-27 LAB — POCT WET PREP (WET MOUNT)
Clue Cells Wet Prep Whiff POC: NEGATIVE
TRICHOMONAS WET PREP HPF POC: ABSENT

## 2017-11-27 MED ORDER — CEPHALEXIN 500 MG PO CAPS: 500.0000 mg | ORAL_CAPSULE | Freq: Two times a day (BID) | ORAL | 0 refills | Status: AC

## 2017-11-27 NOTE — Assessment & Plan Note (Signed)
  UA + blood, microscopy with +1 rods. Will treat with 7 day course of Keflex and send urine for culture. Will inform patient of results of culture.

## 2017-11-27 NOTE — Progress Notes (Signed)
    Subjective:    Patient ID: Hailey Barnes, female    DOB: 05/24/1961, 57 y.o.   MRN: [id number]   CC: pain with urination  Endorses pain toward end of urination. She denies urgency, frequency. Endorses some ?pelvic vs suprapubic pain. Denies flank pain. No nausea or vomiting. No fevers or chills. Also endorses some abnormal vaginal discharge, no smell to it but she feels something is wrong. Has had new sexual partners recently. Would like to be checked for STD.  She reports her BP has been hard to control. She is supposed to get a dental procedure done but her dentist wouldn't do it because her BP was too high. She endorses headaches. Denies chest pain, SOB. She reports she takes her medications as prescribed.   Smoking status reviewed- every day smoker  Review of Systems- see HPI   Objective:  BP (!) 134/100 (BP Location: Left Arm, Patient Position: Sitting, Cuff Size: Large)   Pulse 79   Temp 98.3 F (36.8 C) (Oral)   Wt 284 lb 3.2 oz (128.9 kg)   LMP 04/06/2017   SpO2 95%   BMI 51.15 kg/m  Vitals and nursing note reviewed   General: well nourished, in no acute distress Cardiac: RRR, clear S1 and S2, no murmurs, rubs, or gallops Respiratory: clear to auscultation bilaterally, no increased work of breathing Abdomen: obese abdomen with midline incisional scar. Soft, non-distended. Tender to palpation in suprapubic region. No CVA tenderness. GU: normal female external genitalia. No rashes or lesions seen. Moderate amount of white discharge present. No adnexal masses. Tender to palpation over suprapubic area when checking for CMT. Extremities: no edema or cyanosis. Skin: warm and dry, no rashes noted Neuro: alert and oriented, no focal deficits   Assessment & Plan:    Dysuria  UA + blood, microscopy with +1 rods. Will treat with 7 day course of Keflex and send urine for culture. Will inform patient of results of culture.   Vaginal discharge  Wet prep with many  bacteria, otherwise normal. Gc/chlamydia sent, will inform patient of results.  Hypertension  Poorly controlled. Patient reports compliance with medications. She would benefit from weight loss and tobacco cessation, which I recommended. She is unable to get dental procedure due to elevated BP. Advised she double her lisinopril to 40 mg daily and follow up with PCP in 1-2 weeks for BP check and to assess kidney function and potassium level. Patient verbalized understanding and agreement with plan.     Return in about 10 days (around 12/07/2017), or if symptoms worsen or fail to improve, for HTN.   Lucila Maine, DO Family Medicine Resident PGY-2

## 2017-11-27 NOTE — Assessment & Plan Note (Signed)
  Poorly controlled. Patient reports compliance with medications. She would benefit from weight loss and tobacco cessation, which I recommended. She is unable to get dental procedure due to elevated BP. Advised she double her lisinopril to 40 mg daily and follow up with PCP in 1-2 weeks for BP check and to assess kidney function and potassium level. Patient verbalized understanding and agreement with plan.

## 2017-11-27 NOTE — Assessment & Plan Note (Signed)
  Wet prep with many bacteria, otherwise normal. Gc/chlamydia sent, will inform patient of results.

## 2017-11-27 NOTE — Patient Instructions (Addendum)
  I have sent in antibiotics to your pharmacy. I'll call you with the results of your urine culture and let you know if we need to make any changes.  If your pain worsens or you see blood in your urine please come back to be evaluated here or at the ED.  Please double your lisinopril from 20 mg daily to 40 mg daily. We'll see you 12/08/17 for blood pressure check and lab draw.  Quitting smoking is the single most important thing you can do for your health. Please make every effort to quit.  If you have questions or concerns please do not hesitate to call at [phone].  Lucila Maine, DO PGY-2, [facility] 11/27/2017 2:48 PM

## 2017-11-29 LAB — URINE CULTURE

## 2017-11-30 ENCOUNTER — Telehealth: Payer: Self-pay | Admitting: Family Medicine

## 2017-11-30 LAB — CERVICOVAGINAL ANCILLARY ONLY
CHLAMYDIA, DNA PROBE: NEGATIVE
NEISSERIA GONORRHEA: NEGATIVE

## 2017-11-30 NOTE — Telephone Encounter (Signed)
Patient returned call. Gave lab results. States she is feeling better and is taking the antibiotic prescribed. I advised her to finish the course unless PCP called and advised otherwise. Danley Danker, RN [facility] RN)

## 2017-11-30 NOTE — Telephone Encounter (Signed)
  Called Ms. Whorley to [employer]. She did not answer, left message to call us back. Urine culture w/o significant growth. Gc/chlamydia negative. Wanted to ensure her symptoms were improving since she was last seen.  Lucila Maine, DO PGY-2, [facility] 11/30/2017 1:44 PM

## 2017-12-06 NOTE — Progress Notes (Signed)
Subjective:   Patient ID: Hailey Barnes    DOB: 04/10/1961, 57 y.o. female   MRN: [id number]  Hailey Barnes is a 57 y.o. female with a history of HTN, CHF, OSA, restrictive lung disease here for   Hypertension: - Last appointment 11/27/17 with Dr. Vanetta Shawl, BP 134/100. Increased Lisinopril. Tobacco cessation and weight loss counseling provided.  - Medications: Lisinopril 56m daily, torsemide 643m Pindolol 61m37m Compliance: good - Denies any SOB, CP, vision changes, LE edema, medication SEs, or symptoms of hypotension - Diet: states she eats for comfort. Previously seen nutrition and able to describe "healthy plate" method with portion sizes. Says she knows what to do but states she hasn't been doing it.  VAGINAL DISCHARGE Having vaginal discharge for ~7 days that is abnormal for her. States it "doesn't feel quite right." Discharge consistency: liquid  Discharge color: white Medications tried: none OTC Recent antibiotic use: yes, previously on keflex for presumed UTI but urine culture negative Sex in last month: most recent last week, no sex since symptoms started Possible STD exposure: maybe, is unsure  Symptoms Fever: no Dysuria: no Vaginal bleeding: no Abdomen or Pelvic pain: no Back pain: no Genital sores or ulcers:no Rash: no Pain during sex: yes with deep penetration Missed menstrual period: post-menopausal  ROS see HPI Smoking Status noted  Review of Systems:  Per HPI.   PMFElwoodeviewed. Smoking status reviewed. Medications reviewed.  Objective:   BP 140/72   Pulse 80   Temp 97.7 F (36.5 C)   Ht 5' 2"  (1.575 m)   Wt 292 lb 3.2 oz (132.5 kg)   LMP 04/06/2017   SpO2 96%   BMI 53.44 kg/m  Vitals and nursing note reviewed.  General: well nourished, well developed, in no acute distress with non-toxic appearance HEENT: normocephalic, atraumatic, moist mucous membranes CV: regular rate and rhythm without murmurs, rubs, or gallops, trace lower extremity  edema Lungs: clear to auscultation bilaterally with normal work of breathing Skin: warm, dry, no rashes or lesions Extremities: warm and well perfused, normal tone GYN:  External genitalia within normal limits.  Vaginal mucosa pink, moist, normal rugae. Blind swab revealed no blood. MSK: ROM grossly intact, strength intact, gait normal Neuro: Alert and oriented, speech normal  Assessment & Plan:   Vaginal discharge Wet prep consistent with BV. U/A negative for infection. Treat with Flagyl 500m57mD x7 days. Return precautions given.  Hypertension BP improved on increased dose of Lisinopril. Continue current regimen. Encouraged to monitor BP readings. Tobacco cessation and weight loss counseling again provided. Instructed patient to make appointment with Dr. KovaValentina Lucks further tobacco cessation counseling.   Depression Offered Integrated Care warm handoff as patient endorsed eating as comfort but "knows what she needs to do." I suspect underlying depression as she endorses often feeling lonely and "thought her life would be different at her age," however patient declined warm hand off stating she is not crazy or depressed. No emergent intervention warranted as patient without SI/HI. On chart review, noted that she has previously been on Zoloft for depression, not currently taking. Will address more at further visits.  Morbid obesity (HCC)[place] BMI 53.4. Contributing to comorbid disorders (HTN, DM2). Weight loss counseling provided. Previously seen Dr. SykeJenne Campus nutrition counseling and states she "knows what she needs to do" but isn't doing it. Declined additional referral to nutrition. Explained risks of obesity on her health. Will continue to address at future visits.  Orders Placed This Encounter  Procedures  .  Hemoglobin A1c  . POCT glycosylated hemoglobin (Hb A1C)  . POCT Wet Prep [employer])  . POCT urinalysis dipstick   Meds ordered this encounter  Medications  . lisinopril  (PRINIVIL,ZESTRIL) 40 MG tablet    Sig: Take 1 tablet (40 mg total) by mouth daily.    Dispense:  90 tablet    Refill:  3  . atorvastatin (LIPITOR) 40 MG tablet    Sig: Take 40 mg by mouth once a day    Dispense:  90 tablet    Refill:  3  . metroNIDAZOLE (FLAGYL) 500 MG tablet    Sig: Take 1 tablet (500 mg total) by mouth 2 (two) times daily.    Dispense:  14 tablet    Refill:  0    [name], DO PGY-1, [facility] 12/09/2017 11:54 AM

## 2017-12-08 ENCOUNTER — Encounter: Payer: Self-pay | Admitting: Family Medicine

## 2017-12-08 ENCOUNTER — Other Ambulatory Visit (HOSPITAL_COMMUNITY)
Admission: RE | Admit: 2017-12-08 | Discharge: 2017-12-08 | Disposition: A | Discharge status: Home / Self Care | Payer: Medicare HMO | Source: Ambulatory Visit | Attending: Family Medicine | Admitting: Family Medicine

## 2017-12-08 ENCOUNTER — Other Ambulatory Visit: Payer: Self-pay

## 2017-12-08 ENCOUNTER — Ambulatory Visit (INDEPENDENT_AMBULATORY_CARE_PROVIDER_SITE_OTHER): Payer: Medicare HMO | Admitting: Family Medicine

## 2017-12-08 ENCOUNTER — Other Ambulatory Visit: Payer: Self-pay | Admitting: Family Medicine

## 2017-12-08 VITALS — BP 140/72 | HR 80 | Temp 97.7°F | Ht 62.0 in | Wt 292.2 lb

## 2017-12-08 DIAGNOSIS — N898 Other specified noninflammatory disorders of vagina: Secondary | ICD-10-CM | POA: Insufficient documentation

## 2017-12-08 DIAGNOSIS — I10 Essential (primary) hypertension: Secondary | ICD-10-CM

## 2017-12-08 DIAGNOSIS — F329 Major depressive disorder, single episode, unspecified: Secondary | ICD-10-CM

## 2017-12-08 DIAGNOSIS — B9689 Other specified bacterial agents as the cause of diseases classified elsewhere: Secondary | ICD-10-CM | POA: Diagnosis not present

## 2017-12-08 DIAGNOSIS — F32A Depression, unspecified: Secondary | ICD-10-CM

## 2017-12-08 DIAGNOSIS — E118 Type 2 diabetes mellitus with unspecified complications: Secondary | ICD-10-CM | POA: Diagnosis not present

## 2017-12-08 LAB — POCT WET PREP (WET MOUNT)
Clue Cells Wet Prep Whiff POC: POSITIVE
TRICHOMONAS WET PREP HPF POC: ABSENT

## 2017-12-08 LAB — POCT URINALYSIS DIP (MANUAL ENTRY)
BILIRUBIN UA: NEGATIVE
Blood, UA: NEGATIVE
GLUCOSE UA: NEGATIVE mg/dL
Ketones, POC UA: NEGATIVE mg/dL
Leukocytes, UA: NEGATIVE
NITRITE UA: NEGATIVE
Protein Ur, POC: NEGATIVE mg/dL
SPEC GRAV UA: 1.01 (ref 1.010–1.025)
UROBILINOGEN UA: 0.2 U/dL
pH, UA: 7 (ref 5.0–8.0)

## 2017-12-08 MED ORDER — METRONIDAZOLE 500 MG PO TABS: 500.0000 mg | ORAL_TABLET | Freq: Two times a day (BID) | ORAL | 0 refills | Status: DC

## 2017-12-08 MED ORDER — LISINOPRIL 40 MG PO TABS: 40.0000 mg | ORAL_TABLET | Freq: Every day | ORAL | 3 refills | Status: DC

## 2017-12-08 MED ORDER — ATORVASTATIN CALCIUM 40 MG PO TABS: ORAL_TABLET | ORAL | 3 refills | Status: DC

## 2017-12-08 NOTE — Patient Instructions (Addendum)
It was great to see you!  For your blood pressure,  - Continue taking the Lisinopril 52m. I encourage you to take your BP readings at home - Make an appointment with our pharmacist, Dr. KValentina Lucks who specializes in helping patients stop smoking.  For your vaginal discharge, - We are checking some labs today, we will call you or send you a letter if they are abnormal.  - Be sure to always use a barrier method such as condoms to protect against STDs.  Take care and seek immediate care sooner if you develop any concerns.   ARory Percy DO [facility]

## 2017-12-09 DIAGNOSIS — I5033 Acute on chronic diastolic (congestive) heart failure: Secondary | ICD-10-CM | POA: Diagnosis not present

## 2017-12-09 DIAGNOSIS — G4733 Obstructive sleep apnea (adult) (pediatric): Secondary | ICD-10-CM | POA: Diagnosis not present

## 2017-12-09 DIAGNOSIS — R269 Unspecified abnormalities of gait and mobility: Secondary | ICD-10-CM | POA: Diagnosis not present

## 2017-12-09 LAB — CERVICOVAGINAL ANCILLARY ONLY
BACTERIAL VAGINITIS: POSITIVE — AB
CHLAMYDIA, DNA PROBE: NEGATIVE
Neisseria Gonorrhea: NEGATIVE
TRICH (WINDOWPATH): NEGATIVE

## 2017-12-09 LAB — HEMOGLOBIN A1C
Est. average glucose Bld gHb Est-mCnc: 160 mg/dL
HEMOGLOBIN A1C: 7.2 % — AB (ref 4.8–5.6)

## 2017-12-09 NOTE — Assessment & Plan Note (Addendum)
BP improved on increased dose of Lisinopril. Continue current regimen. Encouraged to monitor BP readings. Tobacco cessation and weight loss counseling again provided. Instructed patient to make appointment with Dr. Valentina Lucks for further tobacco cessation counseling.

## 2017-12-09 NOTE — Assessment & Plan Note (Signed)
Offered Integrated Care warm handoff as patient endorsed eating as comfort but "knows what she needs to do." I suspect underlying depression as she endorses often feeling lonely and "thought her life would be different at her age," however patient declined warm hand off stating she is not crazy or depressed. No emergent intervention warranted as patient without SI/HI. On chart review, noted that she has previously been on Zoloft for depression, not currently taking. Will address more at further visits.

## 2017-12-09 NOTE — Assessment & Plan Note (Signed)
Wet prep consistent with BV. U/A negative for infection. Treat with Flagyl 561m BID x7 days. Return precautions given.

## 2017-12-09 NOTE — Assessment & Plan Note (Addendum)
Last BMI 53.4. Contributing to comorbid disorders (HTN, DM2). Weight loss counseling provided. Previously seen Dr. Jenne Campus for nutrition counseling and states she "knows what she needs to do" but isn't doing it. Declined additional referral to nutrition. Explained risks of obesity on her health. Will continue to address at future visits.

## 2017-12-11 ENCOUNTER — Ambulatory Visit (INDEPENDENT_AMBULATORY_CARE_PROVIDER_SITE_OTHER): Payer: Medicare HMO | Admitting: Gastroenterology

## 2017-12-11 ENCOUNTER — Other Ambulatory Visit (INDEPENDENT_AMBULATORY_CARE_PROVIDER_SITE_OTHER): Payer: Medicare HMO

## 2017-12-11 ENCOUNTER — Encounter: Payer: Self-pay | Admitting: Gastroenterology

## 2017-12-11 VITALS — BP 130/74 | HR 76 | Ht 62.5 in | Wt 288.5 lb

## 2017-12-11 DIAGNOSIS — R1011 Right upper quadrant pain: Secondary | ICD-10-CM

## 2017-12-11 DIAGNOSIS — R197 Diarrhea, unspecified: Secondary | ICD-10-CM

## 2017-12-11 LAB — HEPATIC FUNCTION PANEL
ALT: 18 U/L (ref 0–35)
AST: 14 U/L (ref 0–37)
Albumin: 3.9 g/dL (ref 3.5–5.2)
Alkaline Phosphatase: 111 U/L (ref 39–117)
BILIRUBIN DIRECT: 0 mg/dL (ref 0.0–0.3)
Total Bilirubin: 0.4 mg/dL (ref 0.2–1.2)
Total Protein: 6.8 g/dL (ref 6.0–8.3)

## 2017-12-11 MED ORDER — COLESTIPOL HCL 1 G PO TABS: 1.0000 g | ORAL_TABLET | Freq: Two times a day (BID) | ORAL | 3 refills | Status: DC

## 2017-12-11 NOTE — Patient Instructions (Addendum)
If you are age 55 or older, your body mass index should be between 23-30. Your Body mass index is 51.93 kg/m. If this is out of the aforementioned range listed, please consider follow up with your Primary Care Provider.  If you are age 72 or younger, your body mass index should be between 19-25. Your Body mass index is 51.93 kg/m. If this is out of the aformentioned range listed, please consider follow up with your Primary Care Provider.   Please go to the lab in the basement of our building to have lab work done as you leave today.  You can purchase over the counter Capsaicin cream and use twice a day for abdominal wall pain.  We have sent the following medications to your pharmacy for you to pick up at your convenience: Colestid, 1 gram: Take twice a day  Thank you for entrusting me with your care and for choosing [employer], Dr. La Paloma Cellar

## 2017-12-11 NOTE — Progress Notes (Signed)
HPI :  57 y/o female with a history of obesity (BMI > 50), CVA, OSA, CHF, COPD, DM, here for a follow up visit.   Since the last visit with [place] she had an upper endoscopy which was unremarkable for cause of her pain.  Given her gallbladder polyp noted on ultrasound she was referred to general surgery for her symptoms.  She underwent cholecystectomy with Dr. Georgette Dover last August.  She states initially this worked really well and resolved her pain completely.  Over time she says she has had some recurrent right upper quadrant pain over the past 2 months.  She states it is not as bad in severity as it was before, but it bothers her intermittently, can occur sporadically without any clear precipitants.  She is not sure if eating may make it worse.  Does not radiate anywhere else.  She otherwise endorses some loose stools since the time of her cholecystectomy.  She is having 3-4 loose stools per day without blood.  She denies any relation of her abdominal pain to her bowel movements.  She states eating fatty or greasy stools make her symptoms worse.  She had a colonoscopy last July which was remarkable for one small sessile serrated polyp and diverticulosis.  Prior workup: EGD 04/28/2017 - normal, biopsies obtained - biopsies negative for HP Colonoscopy 04/28/2017 - diverticulosis, 59m polyp - sessile serrated, recall 5 years RUQ [place]6/1/18 - 840mGB polyp, no stones, normal CBD, fatty liver H pylori breath test negative 03/18/17 HIDA 12/04/16 - normal USKoreabdomen 12/02/16 - no gallstones or polyps noted, fatty liver CT abdomen / pelvis 06/01/15 - ? Avascular necrosis of femoral head, adrenal adenoma, fatty liver    Past Medical History:  Diagnosis Date  . Arthritis    "knees" (02/21/2014), back  . CHF (congestive heart failure) ([facility]  . Chronic bronchitis ([facility]   "get it q yr" (02/21/2014)  . Chronic diastolic CHF (congestive heart failure) ([facility]   a. Echo 05/2014: EF 65-70% with Grade 1 DD.  . Marland Kitchenhronic  lower back pain   . Complication of anesthesia    "I don't come out well; I chew on my tongue"  . COPD (chronic obstructive pulmonary disease) ([facility]  . Diabetes mellitus without complication ([facility]   TYPE 2  . Family history of adverse reaction to anesthesia    BROTHER PONV, had a blood clot several days later and heart stopped  . Fatty liver    NONALCOLIC  . Fibroid   . Heart murmur   . Hypertension   . Infection of right eye    current dx on Saturday 7/15 - using drops  . Leg swelling    bilateral  . Migraine 1982-2009  . MVP (mitral valve prolapse)   . Neuromuscular disorder (HCLake Oz[facility]   right leg nerve pain  . Shortness of breath    WITH EXERTION  . Sinus pause    a. noted on telemetry during admission from 10/2016, lasting up to 4.4 seconds. BB discontinued.   . Sleep apnea 02/2016   CPAP 16 TO 20  . [facility] ([facility]2014   "light", LLE weakness remains (03/30/2014)  . 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 Tingling in extremities 12/12/2010   Uric acid and electrolytes (02/23) normal but WBC elevated.   D/Dx: carpal tunnel, ulnar neuropathy Less likely: cervical radiculopathy, vasculitis  Past Surgical History:  Procedure Laterality Date  . [place]  . CHOLECYSTECTOMY N/A 06/17/2017   Procedure: LAPAROSCOPIC CHOLECYSTECTOMY WITH INTRAOPERATIVE CHOLANGIOGRAM;  Surgeon: Donnie Mesa, MD;  Location: [facility];  Service: General;  Laterality: N/A;  . COLONOSCOPY WITH PROPOFOL N/A 04/28/2017   Procedure: COLONOSCOPY WITH PROPOFOL;  Surgeon: Manus Gunning, MD;  [facility];  Service: Gastroenterology;  Laterality: N/A;  . DILATATION & CURETTAGE/HYSTEROSCOPY WITH MYOSURE N/A 05/06/2016   Procedure: DILATATION & CURETTAGE/HYSTEROSCOPY WITH MYOSURE;  Surgeon: Terrance Mass, MD;  Location: [facility];  Service: Gynecology;  Laterality: N/A;  request to follow around  8:45  requests one hour OR time  . DILATATION & CURETTAGE/HYSTEROSCOPY WITH MYOSURE N/A 04/03/2017   Procedure: DILATATION & CURETTAGE/HYSTEROSCOPY WITH MYOSURE;  Surgeon: Terrance Mass, MD;  Location: [facility];  Service: Gynecology;  Laterality: N/A;  . DILATION AND CURETTAGE OF UTERUS    . ESOPHAGOGASTRODUODENOSCOPY (EGD) WITH PROPOFOL N/A 04/28/2017   Procedure: ESOPHAGOGASTRODUODENOSCOPY (EGD) WITH PROPOFOL;  Surgeon: Manus Gunning, MD;  [facility];  Service: Gastroenterology;  Laterality: N/A;  . FOOT SURGERY Bilateral    "took bones out; put pins in"  . LEFT AND RIGHT HEART CATHETERIZATION WITH CORONARY ANGIOGRAM N/A 03/31/2014   Procedure: LEFT AND RIGHT HEART CATHETERIZATION WITH CORONARY ANGIOGRAM;  Surgeon: Birdie Riddle, MD;  Location: [facility];  Service: Cardiovascular;  Laterality: N/A;  . MYOMECTOMY  YRS AGO  . sonohystogram  04/14/2016   [facility]: intramural fibroid, premenopausal endometrium, right fluid filled tubular structure   Family History  Problem Relation Age of Onset  . Hypertension Mother   . COPD Mother   . Hypertension Father   . Cancer Sister        UTERINE???  . COPD Sister   . Hypertension Sister   . Hypertension Sister    Social History   Tobacco Use  . Smoking status: Current Every Day Smoker    Packs/day: 0.50    Years: 40.00    Pack years: 20.00    Types: Cigarettes  . Smokeless tobacco: Never Used  . Tobacco comment: Would like to quit  Substance Use Topics  . Alcohol use: No    Alcohol/week: 0.0 oz  . Drug use: No    Comment: 03/30/2014 "stopped using crack 02/21/2014"   Current Outpatient Medications  Medication Sig Dispense Refill  . acetaminophen (TYLENOL) 500 MG tablet Take 1 tablet (500 mg total) by mouth every 6 (six) hours as needed. (Patient taking differently: Take 500 mg by mouth every 6 (six) hours as needed for mild pain. ) 30 tablet 0  . albuterol (PROVENTIL HFA;VENTOLIN HFA)  108 (90 Base) MCG/ACT inhaler Inhale 2 puffs into the lungs every 6 (six) hours as needed for wheezing or shortness of breath.     Marland Kitchen aspirin 81 MG tablet Take 81 mg by mouth daily as needed (for chest pain).     Marland Kitchen atorvastatin (LIPITOR) 40 MG tablet Take 40 mg by mouth once a day 90 tablet 3  . Blood Glucose Monitoring Suppl (ACCU-CHEK AVIVA PLUS) w/Device KIT Use daily as needed to check blood sugar. 1 kit 0  . diclofenac sodium (VOLTAREN) 1 % GEL Apply 4 g topically 4 (four) times daily. To affected joint. 100 g 11  . glucose blood (ACCU-CHEK AVIVA) test strip Use daily as needed to check blood sugar. 100 each 12  . hydrocortisone 1 % ointment APPLY ONE APPLICATION TOPICALLY TWO TIMES DAILY AS  NEEDED (ECZEMA ON FACE) 29 g 1  . hydrOXYzine (ATARAX/VISTARIL) 50 MG tablet Take 50 mg by mouth at bedtime as needed (sleep).    Marland Kitchen ibuprofen (ADVIL,MOTRIN) 600 MG tablet Take 1 tablet (600 mg total) by mouth every 8 (eight) hours as needed. 30 tablet 0  . Lancet Devices (ACCU-CHEK SOFTCLIX) lancets Use daily as needed to check blood sugar. 1 each 11  . lisinopril (PRINIVIL,ZESTRIL) 40 MG tablet Take 1 tablet (40 mg total) by mouth daily. 90 tablet 3  . metFORMIN (GLUCOPHAGE) 1000 MG tablet TAKE ONE TABLET BY MOUTH TWICE DAILY WITH  A  MEAL 180 tablet 3  . metroNIDAZOLE (FLAGYL) 500 MG tablet Take 1 tablet (500 mg total) by mouth 2 (two) times daily. 14 tablet 0  . mometasone-formoterol (DULERA) 200-5 MCG/ACT AERO Inhale 2 puffs into the lungs 2 (two) times daily as needed for wheezing or shortness of breath.     . pindolol (VISKEN) 5 MG tablet Take 1 tablet (5 mg total) by mouth 2 (two) times daily. (Patient taking differently: Take 5 mg by mouth daily. ) 180 tablet 3  . Potassium Chloride ER 20 MEQ TBCR TAKE 1 TABLET BY MOUTH ONCE DAILY (Patient taking differently: Take 20 mEq by mouth once a day) 30 tablet 11  . tiotropium (SPIRIVA) 18 MCG inhalation capsule Place 1 capsule (18 mcg total) into inhaler and  inhale daily. (Patient taking differently: Place 18 mcg into inhaler and inhale daily as needed (shortness of breath). ) 30 capsule 1  . torsemide (DEMADEX) 20 MG tablet Take 3 tablets (60 mg total) by mouth daily. Take 3 tablets by mouth daily (Patient taking differently: Take 60 mg by mouth daily. ) 90 tablet 11  . triamcinolone cream (KENALOG) 0.1 % APPLY ONE APPLICATION TOPICALLY THREE TIMES DAILY AS NEEDED (ECZEMA ON ARMS) 30 g 1   No current facility-administered medications for this visit.    No Known Allergies   Review of Systems: All systems reviewed and negative except where noted in HPI.   Lab Results  Component Value Date   WBC 10.6 (H) 07/02/2017   HGB 13.7 07/02/2017   HCT 41.4 07/02/2017   MCV 84.3 07/02/2017   PLT 257 07/02/2017    Lab Results  Component Value Date   CREATININE 0.93 07/02/2017   BUN 9 07/02/2017   NA 137 07/02/2017   K 4.2 07/02/2017   CL 106 07/02/2017   CO2 23 07/02/2017    Lab Results  Component Value Date   ALT 28 03/18/2017   AST 23 03/18/2017   ALKPHOS 104 03/18/2017   BILITOT 0.3 03/18/2017     Physical Exam: BP 130/74   Pulse 76   Ht 5' 2.5" (1.588 m)   Wt 288 lb 8 oz (130.9 kg)   LMP 04/06/2017   BMI 51.93 kg/m  Constitutional: Pleasant, female in no acute distress. HEENT: Normocephalic and atraumatic. Conjunctivae are normal. No scleral icterus. Neck supple.  Cardiovascular: Normal rate, regular rhythm.  Pulmonary/chest: Effort normal and breath sounds normal. No wheezing, rales or rhonchi. Abdominal: Soft, protuberant, RUQ TTP with postiive Carnett sign. . There are no masses palpable. No hepatomegaly. Extremities: no edema Lymphadenopathy: No cervical adenopathy noted. Neurological: Alert and oriented to person place and time. Skin: Skin is warm and dry. No rashes noted. Psychiatric: Normal mood and affect. Behavior is normal.   ASSESSMENT AND PLAN: 57 year old female with history as outlined above, here for  reassessment of the following issues:  Right upper quadrant  pain - now status post cholecystectomy.  This resolved her symptoms for several months however now having some intermittent right upper quadrant pain, slightly different than previous.  On exam she is rather tender and symptoms were easily reproduced with Carnett sign.  I suspect she may have abdominal wall pain or neuropathic pain post cholecystectomy.  I discussed options with her.  We will try some topical capsaicin cream applied to the area a few times a day to see if this helps initially.  We will check her LFTs today to make sure they are stable. If no improvement she will call me back.   Diarrhea - likely postcholecystectomy related.  We will try her on Colestid 1 g twice a day.  If no improvement I asked her to call back for reassessment.  [place] Cellar, MD [facility] Pager [phone]

## 2017-12-14 ENCOUNTER — Ambulatory Visit: Payer: Medicare HMO | Admitting: Sports Medicine

## 2017-12-14 DIAGNOSIS — Z0189 Encounter for other specified special examinations: Secondary | ICD-10-CM

## 2017-12-22 ENCOUNTER — Ambulatory Visit: Payer: Medicare HMO | Admitting: Sports Medicine

## 2018-01-06 DIAGNOSIS — I5033 Acute on chronic diastolic (congestive) heart failure: Secondary | ICD-10-CM | POA: Diagnosis not present

## 2018-01-06 DIAGNOSIS — G4733 Obstructive sleep apnea (adult) (pediatric): Secondary | ICD-10-CM | POA: Diagnosis not present

## 2018-01-06 DIAGNOSIS — R269 Unspecified abnormalities of gait and mobility: Secondary | ICD-10-CM | POA: Diagnosis not present

## 2018-01-08 ENCOUNTER — Telehealth: Payer: Self-pay

## 2018-01-08 DIAGNOSIS — M5416 Radiculopathy, lumbar region: Secondary | ICD-10-CM

## 2018-01-08 NOTE — Telephone Encounter (Signed)
Unable to leave message with patient. No voicemail. Left message at [facility] for epidural order.

## 2018-01-08 NOTE — Telephone Encounter (Signed)
Hailey Barnes called and states she needs an order for epidural injection. She states she hasn't need an appointment with Dr [name] in the past. Please advise.

## 2018-01-08 NOTE — Telephone Encounter (Signed)
Lumbar epidural steroid injection ordered.  This is for the left L5 nerve root.  This would produce pain down the left leg into the lateral calf and the foot. If her current pain is different than this location please let me know and we will cancel the epidural steroid injection and have patient return to clinic for repeat assessment. Repeat injections of chronic lumbar pain if not worsening do not require visits more frequently than yearly however changing symptoms will require visits.  Please contact [facility] to schedule the epidural steroid injection.

## 2018-01-11 ENCOUNTER — Encounter: Payer: Self-pay | Admitting: Family Medicine

## 2018-01-19 ENCOUNTER — Ambulatory Visit
Admission: RE | Admit: 2018-01-19 | Discharge: 2018-01-19 | Disposition: A | Discharge status: Home / Self Care | Payer: Medicare HMO | Source: Ambulatory Visit | Attending: Family Medicine | Admitting: Family Medicine

## 2018-01-19 DIAGNOSIS — M5416 Radiculopathy, lumbar region: Secondary | ICD-10-CM | POA: Diagnosis not present

## 2018-01-19 DIAGNOSIS — M47817 Spondylosis without myelopathy or radiculopathy, lumbosacral region: Secondary | ICD-10-CM | POA: Diagnosis not present

## 2018-01-19 MED ORDER — IOPAMIDOL (ISOVUE-M 200) INJECTION 41%
1.0000 mL | Freq: Once | INTRAMUSCULAR | Status: AC
  Administered 2018-01-19: 1 mL via EPIDURAL

## 2018-01-19 MED ORDER — METHYLPREDNISOLONE ACETATE 40 MG/ML INJ SUSP (RADIOLOG
120.0000 mg | Freq: Once | INTRAMUSCULAR | Status: AC
  Administered 2018-01-19: 120 mg via EPIDURAL

## 2018-01-26 ENCOUNTER — Other Ambulatory Visit: Payer: Self-pay | Admitting: Physician Assistant

## 2018-02-07 NOTE — Progress Notes (Deleted)
   [facility] Phone: [phone]   Date of Visit: 02/08/2018   HPI:  -   ROS: See HPI.  [place]:  PMH: HTN DM2 HLD Obesity HFpEF OSA NASH Tobacco Use  PHYSICAL EXAM: LMP 04/06/2017  Gen: *** HEENT: *** Heart: *** Lungs: *** Neuro: *** Ext: ***  ASSESSMENT/PLAN:  Health maintenance:  -***  No problem-specific Assessment & Plan notes found for this encounter.  FOLLOW UP: Follow up in *** for ***  Smiley Houseman, MD PGY [place]

## 2018-02-08 ENCOUNTER — Ambulatory Visit: Payer: Medicare HMO | Admitting: Internal Medicine

## 2018-02-17 NOTE — Progress Notes (Signed)
   [facility] Phone: [phone]   Date of Visit: 02/18/2018   HPI:  Vaginal Irritation:  - reports of intermittent vaginal itching and intermittent pinch on the right wall of the vaginal canal. Symptoms started about 2 weeks ago.  - she has not noticed any discharge  - no vaginal bleeding - symptoms have been stable  - no abdominal pain, dysuria, increased urinary frequency  Muscle Cramping:  - reports of 1 year history of intermittent cramping of her hands and feet  - symptoms are stable - she wonders if this is due to her diuretic - her prior PCP recommended taking magnesium supplement which she has not taken in months   ROS: See HPI.  [place]:  PMH: HTN HFpEF Restrictive Lung Disease OSA NASH DM2 OA Tobacco USe HLD Obesity   PHYSICAL EXAM: BP 114/72 (BP Location: Left Wrist, Patient Position: Sitting, Cuff Size: Normal)   Pulse 90   Temp 97.6 F (36.4 C) (Oral)   Ht 5' 2.5" (1.588 m)   Wt 291 lb (132 kg)   LMP 04/06/2017   SpO2 98%   BMI 52.38 kg/m  GEN: NAD CV: RRR, no murmurs, rubs, or gallops PULM: CTAB, normal effort ABD: Soft, nontender, nondistended, NABS, no organomegaly FU:XNATFT genitalia: normal external genitalia, vulva, vagina, cervix, uterus and adnexa. No Cervical motion tenderness. There is some thick white discharge but does not appear yeast like. There is a small dark brown macule at the 12 o'clock position on the cervix which is persistent after wiping with fauxswab. It has irregular borders. It is likely around 3-47m in diameter.   SKIN: No rash or cyanosis; warm and well-perfused EXTR: No lower extremity edema or calf tenderness PSYCH: Mood and affect euthymic, normal rate and volume of speech NEURO: Awake, alert, no focal deficits grossly, normal speech   ASSESSMENT/PLAN:  Health maintenance:  - Tdap sent to pharmacy  - pap smear done today   Vaginal Discharge:  Wet prep is unremarkable today.  Gc/chlamydia testing also done as well as HIV and RPR  Muscle Cramping:  Will check BMP and Mag to ensure electrolytes are stable.   Abnormal Cervix Finding There is a small dark brown macule at the 12 o'clock position on the cervix which is persistent after wiping with fauxswab. It has irregular borders. It is likely around 3-421min diameter. Will await results of pap smear. However, she will need to have a colposcopy regardless of the results.   KaSmiley HousemanMD PGY 3 [place]

## 2018-02-18 ENCOUNTER — Other Ambulatory Visit (HOSPITAL_COMMUNITY)
Admission: RE | Admit: 2018-02-18 | Discharge: 2018-02-18 | Disposition: A | Discharge status: Home / Self Care | Payer: Medicare HMO | Source: Ambulatory Visit | Attending: Family Medicine | Admitting: Family Medicine

## 2018-02-18 ENCOUNTER — Other Ambulatory Visit: Payer: Self-pay

## 2018-02-18 ENCOUNTER — Encounter: Payer: Self-pay | Admitting: Internal Medicine

## 2018-02-18 ENCOUNTER — Ambulatory Visit (INDEPENDENT_AMBULATORY_CARE_PROVIDER_SITE_OTHER): Payer: Medicare HMO | Admitting: Internal Medicine

## 2018-02-18 VITALS — BP 114/72 | HR 90 | Temp 97.6°F | Ht 62.5 in | Wt 291.0 lb

## 2018-02-18 DIAGNOSIS — R252 Cramp and spasm: Secondary | ICD-10-CM | POA: Diagnosis not present

## 2018-02-18 DIAGNOSIS — Z113 Encounter for screening for infections with a predominantly sexual mode of transmission: Secondary | ICD-10-CM | POA: Diagnosis not present

## 2018-02-18 DIAGNOSIS — N898 Other specified noninflammatory disorders of vagina: Secondary | ICD-10-CM

## 2018-02-18 DIAGNOSIS — N889 Noninflammatory disorder of cervix uteri, unspecified: Secondary | ICD-10-CM

## 2018-02-18 DIAGNOSIS — Z114 Encounter for screening for human immunodeficiency virus [HIV]: Secondary | ICD-10-CM

## 2018-02-18 DIAGNOSIS — Z124 Encounter for screening for malignant neoplasm of cervix: Secondary | ICD-10-CM

## 2018-02-18 LAB — POCT WET PREP (WET MOUNT)
CLUE CELLS WET PREP WHIFF POC: NEGATIVE
Trichomonas Wet Prep HPF POC: ABSENT

## 2018-02-18 MED ORDER — TETANUS-DIPHTH-ACELL PERTUSSIS 5-2.5-18.5 LF-MCG/0.5 IM SUSP: 0.5000 mL | Freq: Once | INTRAMUSCULAR | 0 refills | Status: AC

## 2018-02-18 NOTE — Patient Instructions (Addendum)
I will call you about the tests that we are getting today.  Then we can schedule a colposcopy as we discussed.  I sent a prescription for your tetanus shot to the pharmacy

## 2018-02-19 ENCOUNTER — Telehealth: Payer: Self-pay | Admitting: Internal Medicine

## 2018-02-19 LAB — HIV ANTIBODY (ROUTINE TESTING W REFLEX): HIV SCREEN 4TH GENERATION: NONREACTIVE

## 2018-02-19 LAB — MAGNESIUM: MAGNESIUM: 1.9 mg/dL (ref 1.6–2.3)

## 2018-02-19 LAB — BASIC METABOLIC PANEL
BUN / CREAT RATIO: 12 (ref 9–23)
BUN: 12 mg/dL (ref 6–24)
CHLORIDE: 101 mmol/L (ref 96–106)
CO2: 28 mmol/L (ref 20–29)
Calcium: 9.7 mg/dL (ref 8.7–10.2)
Creatinine, Ser: 1.02 mg/dL — ABNORMAL HIGH (ref 0.57–1.00)
GFR calc non Af Amer: 63 mL/min/{1.73_m2} (ref >59)
GFR, EST AFRICAN AMERICAN: 72 mL/min/{1.73_m2} (ref >59)
GLUCOSE: 92 mg/dL (ref 65–99)
Potassium: 4 mmol/L (ref 3.5–5.2)
SODIUM: 144 mmol/L (ref 134–144)

## 2018-02-19 LAB — CERVICOVAGINAL ANCILLARY ONLY
CHLAMYDIA, DNA PROBE: NEGATIVE
Neisseria Gonorrhea: NEGATIVE

## 2018-02-19 LAB — CYTOLOGY - PAP
Diagnosis: NEGATIVE
HPV: NOT DETECTED

## 2018-02-19 LAB — RPR: RPR: NONREACTIVE

## 2018-02-19 NOTE — Telephone Encounter (Signed)
Called to inform of normal results.

## 2018-02-24 ENCOUNTER — Other Ambulatory Visit: Payer: Self-pay | Admitting: Family Medicine

## 2018-02-25 ENCOUNTER — Ambulatory Visit (INDEPENDENT_AMBULATORY_CARE_PROVIDER_SITE_OTHER): Payer: Medicare HMO | Admitting: Family Medicine

## 2018-02-25 ENCOUNTER — Encounter: Payer: Self-pay | Admitting: Family Medicine

## 2018-02-25 ENCOUNTER — Other Ambulatory Visit: Payer: Self-pay

## 2018-02-25 VITALS — BP 132/84 | HR 76 | Temp 98.2°F | Wt 294.0 lb

## 2018-02-25 DIAGNOSIS — N889 Noninflammatory disorder of cervix uteri, unspecified: Secondary | ICD-10-CM

## 2018-02-25 MED ORDER — TORSEMIDE 20 MG PO TABS: ORAL_TABLET | ORAL | 3 refills | Status: DC

## 2018-02-25 NOTE — Progress Notes (Signed)
Patient here for colposcopy after recent preventive medicine visit.  Her Pap smear was normal.  Provider noticed an unusual looking area on her cervix on gross exam.  Patient is asymptomatic.  She has had previously myomectomy some polyps removed.  She has not had any vaginal bleeding.  She is postmenopausal.  Patient given informed consent, signed copy in the chart.  Placed in lithotomy position. Cervix viewed with speculum and colposcope after application of acetic acid.   Colposcopy adequate (entire squamocolumnar junctions seen  in entirety) ?  No. Acetowhite lesions?  None Punctation?  None Mosaicism?  None Abnormal vasculature?  None Biopsies?  No ECC?  No Complications?  No  COMMENTS:  Patient was given post procedure instructions.   Clinically normal colposcopic exam.  I do not know what the resolve but it is no longer there.  She has had 2 Pap smears both normal and her next will be due in 3 years.  We discussed.  Follow-up with regular PCP

## 2018-03-09 ENCOUNTER — Other Ambulatory Visit: Payer: Self-pay | Admitting: Family Medicine

## 2018-03-09 DIAGNOSIS — I10 Essential (primary) hypertension: Secondary | ICD-10-CM

## 2018-03-09 NOTE — Progress Notes (Signed)
   [facility] Phone: [phone]   Date of Visit: 03/10/2018   HPI:  Abdominal Pain:  - reports of intermittent achy/sharp in there RUQ for the past 3-4 months. It feels like spasms - she was seen by her GI physician for this who reported that it is likely nerve pain from her cholecystectomy done in August 2018. She does not recall having any labs done at that time.  - her symptoms last about 1 hour at a time. Symptoms ease off when say lays down.  - she has also been taking Tylenol 1014m every 4-6 hours without much relief - no fevers, nausea, or vomiting - her bowel movements have not changed - does not worsen with deep breathing - denies dysuria or urinary frequency or hematuria - she has had a history of H. Pylori in the past with negative testing after treatment.  - she had EGD in 04/2017: EGD was normal. Biopsy for h. Pylori was negative.  - colonoscopy in 04/2017 with polyp which was sessile serrated; follow up colonoscopy in 5 years recommended.   ROS: See HPI.  PAmistad  PMH: HTN CHF Restrictive Lung Disease OSA NASH, HLD DM2 Obesity  PHYSICAL EXAM: BP 124/70   Pulse 85   Temp 98.3 F (36.8 C) (Oral)   Wt 290 lb (131.5 kg)   LMP 04/06/2017   SpO2 95%   BMI 52.20 kg/m  GEN: NAD CV: RRR, no murmurs, rubs, or gallops PULM: CTAB, normal effort ABD: Soft, abdominal obesity. Tenderness to palpation of the RUQ and epigastric abdomen without guarding or rebound. nondistended, NABS, no organomegaly No flank pain  SKIN: No rash or cyanosis; warm and well-perfused EXTR: No lower extremity edema or calf tenderness PSYCH: Mood and affect euthymic, normal rate and volume of speech NEURO: Awake, alert, no focal deficits grossly, normal speech   ASSESSMENT/PLAN:  1. Type 2 diabetes mellitus with complication, without long-term current use of insulin (HCC) - HgB A1c  2. Abdominal pain, chronic, right upper quadrant Differentials include  hepatitis (she has a history of NAFLD), h.pylori, pancreatitis (less likely). She has a history of cholecystectomy. She is afebrile with stable vitals. No risk factors for PUD. Will further evaluate with CBC, CMP, Lipase, H. Pylori breath test, and RUQ ultrasound. Tramadol PRN for severe. Limit Tylenol for 10058mTID PRN.  - CMP14+EGFR - CBC - Lipase - H. pylori breath test - USKoreabdomen Limited RUQ; Future  KaSmiley HousemanMD PGY 3 [place]

## 2018-03-10 ENCOUNTER — Encounter: Payer: Self-pay | Admitting: Internal Medicine

## 2018-03-10 ENCOUNTER — Ambulatory Visit (INDEPENDENT_AMBULATORY_CARE_PROVIDER_SITE_OTHER): Payer: Medicare HMO | Admitting: Internal Medicine

## 2018-03-10 ENCOUNTER — Other Ambulatory Visit: Payer: Self-pay

## 2018-03-10 VITALS — BP 124/70 | HR 85 | Temp 98.3°F | Wt 290.0 lb

## 2018-03-10 DIAGNOSIS — G8929 Other chronic pain: Secondary | ICD-10-CM

## 2018-03-10 DIAGNOSIS — E118 Type 2 diabetes mellitus with unspecified complications: Secondary | ICD-10-CM

## 2018-03-10 DIAGNOSIS — R1011 Right upper quadrant pain: Secondary | ICD-10-CM

## 2018-03-10 LAB — POCT GLYCOSYLATED HEMOGLOBIN (HGB A1C): HBA1C, POC (CONTROLLED DIABETIC RANGE): 7.1 % — AB (ref 0.0–7.0)

## 2018-03-10 MED ORDER — ATORVASTATIN CALCIUM 40 MG PO TABS: ORAL_TABLET | ORAL | 3 refills | Status: DC

## 2018-03-10 MED ORDER — TRAMADOL HCL 50 MG PO TABS: 50.0000 mg | ORAL_TABLET | Freq: Three times a day (TID) | ORAL | 0 refills | Status: DC | PRN

## 2018-03-10 NOTE — Patient Instructions (Signed)
It was a pleasure seeing you today in our clinic. Today we discussed abdominal pain Here is the treatment plan we have discussed and agreed upon together:    1) get labs to further evaluate 2) get ultrasound of your liver 3) Tramadol as needed for severe pain  4) Tylenol take up to 2 tablets three times a day only    Our clinic's number is [phone]. Please call with questions or concerns about what we discussed today.   Be well, Dr. Dallas Schimke

## 2018-03-10 NOTE — Telephone Encounter (Signed)
Patient came in for an appointment today and medication refilled.  Jazmin Hartsell,CMA

## 2018-03-11 LAB — CBC
Hematocrit: 47.2 % — ABNORMAL HIGH (ref 34.0–46.6)
Hemoglobin: 16 g/dL — ABNORMAL HIGH (ref 11.1–15.9)
MCH: 28.4 pg (ref 26.6–33.0)
MCHC: 33.9 g/dL (ref 31.5–35.7)
MCV: 84 fL (ref 79–97)
PLATELETS: 260 10*3/uL (ref 150–450)
RBC: 5.63 x10E6/uL — ABNORMAL HIGH (ref 3.77–5.28)
RDW: 15.3 % (ref 12.3–15.4)
WBC: 11.7 10*3/uL — ABNORMAL HIGH (ref 3.4–10.8)

## 2018-03-11 LAB — CMP14+EGFR
A/G RATIO: 1.5 (ref 1.2–2.2)
ALK PHOS: 123 IU/L — AB (ref 39–117)
ALT: 27 IU/L (ref 0–32)
AST: 21 IU/L (ref 0–40)
Albumin: 4.4 g/dL (ref 3.5–5.5)
BUN / CREAT RATIO: 10 (ref 9–23)
BUN: 10 mg/dL (ref 6–24)
Bilirubin Total: <0.2 mg/dL (ref 0.0–1.2)
CO2: 27 mmol/L (ref 20–29)
Calcium: 9.7 mg/dL (ref 8.7–10.2)
Chloride: 101 mmol/L (ref 96–106)
Creatinine, Ser: 0.99 mg/dL (ref 0.57–1.00)
GFR calc Af Amer: 74 mL/min/{1.73_m2} (ref >59)
GFR, EST NON AFRICAN AMERICAN: 64 mL/min/{1.73_m2} (ref >59)
GLOBULIN, TOTAL: 2.9 g/dL (ref 1.5–4.5)
Glucose: 98 mg/dL (ref 65–99)
POTASSIUM: 4.1 mmol/L (ref 3.5–5.2)
SODIUM: 144 mmol/L (ref 134–144)
TOTAL PROTEIN: 7.3 g/dL (ref 6.0–8.5)

## 2018-03-11 LAB — LIPASE: Lipase: 28 U/L (ref 14–72)

## 2018-03-12 LAB — H. PYLORI BREATH TEST: H pylori Breath Test: NEGATIVE

## 2018-03-16 ENCOUNTER — Ambulatory Visit (HOSPITAL_COMMUNITY)
Admission: RE | Admit: 2018-03-16 | Discharge: 2018-03-16 | Disposition: A | Discharge status: Home / Self Care | Payer: Medicare HMO | Source: Ambulatory Visit | Attending: Family Medicine | Admitting: Family Medicine

## 2018-03-16 DIAGNOSIS — R1011 Right upper quadrant pain: Secondary | ICD-10-CM | POA: Diagnosis not present

## 2018-03-16 DIAGNOSIS — G8929 Other chronic pain: Secondary | ICD-10-CM

## 2018-03-16 DIAGNOSIS — Z9049 Acquired absence of other specified parts of digestive tract: Secondary | ICD-10-CM | POA: Insufficient documentation

## 2018-03-16 DIAGNOSIS — K760 Fatty (change of) liver, not elsewhere classified: Secondary | ICD-10-CM | POA: Diagnosis not present

## 2018-03-17 ENCOUNTER — Telehealth: Payer: Self-pay | Admitting: Internal Medicine

## 2018-03-17 ENCOUNTER — Other Ambulatory Visit: Payer: Self-pay | Admitting: Internal Medicine

## 2018-03-17 DIAGNOSIS — D72829 Elevated white blood cell count, unspecified: Secondary | ICD-10-CM

## 2018-03-17 DIAGNOSIS — R748 Abnormal levels of other serum enzymes: Secondary | ICD-10-CM

## 2018-03-17 DIAGNOSIS — D582 Other hemoglobinopathies: Secondary | ICD-10-CM

## 2018-03-17 MED ORDER — OMEPRAZOLE 20 MG PO CPDR: 20.0000 mg | DELAYED_RELEASE_CAPSULE | Freq: Every day | ORAL | 3 refills | Status: DC

## 2018-03-17 NOTE — Telephone Encounter (Signed)
Called patient to discuss right upper quadrant ultrasound results as well as her blood work from recent visit.  The right upper quadrant ultrasound does not really show a specific cause for her right upper quadrant pain.  She does have fatty liver which is not a new finding.  As far as her blood work, she had mildly elevated alkaline phosphatase and mildly elevated hemoglobin and white blood cell count.  I do not think she has an infectious process going on.  I would like to repeat her CBC and LFTs to reevaluate this.  Her H. pylori breath test is negative.  We will do a trial of omeprazole to see if that would help with her symptoms.  Prescription sent to pharmacy.  Lab visit made for this Friday.

## 2018-03-19 ENCOUNTER — Other Ambulatory Visit: Payer: Medicare HMO

## 2018-03-19 DIAGNOSIS — R748 Abnormal levels of other serum enzymes: Secondary | ICD-10-CM | POA: Diagnosis not present

## 2018-03-19 DIAGNOSIS — D72829 Elevated white blood cell count, unspecified: Secondary | ICD-10-CM | POA: Diagnosis not present

## 2018-03-19 DIAGNOSIS — D582 Other hemoglobinopathies: Secondary | ICD-10-CM | POA: Diagnosis not present

## 2018-03-20 LAB — CBC
HEMATOCRIT: 44.8 % (ref 34.0–46.6)
HEMOGLOBIN: 16 g/dL — AB (ref 11.1–15.9)
MCH: 29.5 pg (ref 26.6–33.0)
MCHC: 35.7 g/dL (ref 31.5–35.7)
MCV: 83 fL (ref 79–97)
Platelets: 232 10*3/uL (ref 150–450)
RBC: 5.43 x10E6/uL — AB (ref 3.77–5.28)
RDW: 15.2 % (ref 12.3–15.4)
WBC: 11 10*3/uL — AB (ref 3.4–10.8)

## 2018-03-20 LAB — GAMMA GT: GGT: 46 IU/L (ref 0–60)

## 2018-03-20 LAB — ALKALINE PHOSPHATASE: Alkaline Phosphatase: 106 IU/L (ref 39–117)

## 2018-03-22 ENCOUNTER — Telehealth: Payer: Self-pay | Admitting: Internal Medicine

## 2018-03-22 NOTE — Telephone Encounter (Signed)
Alk phos and GGT nml.  WBC is slightly elevated. Would repeat in a few weeks with diff and peripheral smear.  Questions answered.

## 2018-03-24 ENCOUNTER — Ambulatory Visit (INDEPENDENT_AMBULATORY_CARE_PROVIDER_SITE_OTHER): Payer: Medicare HMO | Admitting: Sports Medicine

## 2018-03-24 ENCOUNTER — Encounter: Payer: Self-pay | Admitting: Sports Medicine

## 2018-03-24 VITALS — BP 141/100 | HR 77 | Resp 16 | Wt 296.0 lb

## 2018-03-24 DIAGNOSIS — M170 Bilateral primary osteoarthritis of knee: Secondary | ICD-10-CM

## 2018-03-24 DIAGNOSIS — M180 Bilateral primary osteoarthritis of first carpometacarpal joints: Secondary | ICD-10-CM

## 2018-03-24 NOTE — Assessment & Plan Note (Signed)
Right CMC injection today. Last injection was approximately 6 months ago. Return in a few weeks for left CMC injection.

## 2018-03-24 NOTE — Assessment & Plan Note (Signed)
Referral to bariatric surgery, she is a candidate for sleeve gastrectomy.   Weight loss medicines and nutrition therapy will probably not be sufficient, she needs to lose 150 pounds or so. This will probably cure many of her medical comorbidities, as well as keep her from having to have a knee replacement.

## 2018-03-24 NOTE — Assessment & Plan Note (Signed)
Aspiration and injection of the right knee today. Return in a couple of weeks for the left. I am also going to get her approved for Visco supplementation.

## 2018-03-24 NOTE — Progress Notes (Signed)
Subjective:    I'm seeing this patient as a consultation for: Dr. Shela Commons, Dr. Dennie Fetters  CC: Hand pain, knee pain  HPI: This is a pleasant 57 year old female, she has known bilateral trapeziometacarpal joint osteoarthritis, more recently she is developed pain in both knees, right worse than left, medial joint line with swelling.  Referred to me for further evaluation and definitive treatment, oral NSAIDs, activity modification has not been effective.  We injected her trapeziometacarpal joint about 6 months ago, couple of weeks ago she started to have a recurrence of pain and desires repeat interventional treatment here today.  I reviewed the past medical history, family history, social history, surgical history, and allergies today and no changes were needed.  Please see the problem list section below in epic for further details.  Past Medical History: Past Medical History:  Diagnosis Date  . Arthritis    "knees" (02/21/2014), back  . CHF (congestive heart failure) ([facility])   . Chronic bronchitis ([facility])    "get it q yr" (02/21/2014)  . Chronic diastolic CHF (congestive heart failure) ([facility])    a. Echo 05/2014: EF 65-70% with Grade 1 DD.  Marland Kitchen Chronic lower back pain   . Complication of anesthesia    "I don't come out well; I chew on my tongue"  . COPD (chronic obstructive pulmonary disease) ([facility])   . 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 Family history of adverse reaction to anesthesia    BROTHER PONV, had a blood clot several days later and heart stopped  . Fatty liver    NONALCOLIC  . Fibroid   . Heart murmur   . Hypertension   . Infection of right eye    current dx on Saturday 7/15 - using drops  . Leg swelling    bilateral  . Migraine 1982-2009  . MVP (mitral valve prolapse)   . Neuromuscular disorder ([facility])    right leg nerve pain  . Shortness of breath    WITH EXERTION  . Sinus pause    a. noted on telemetry during admission from 10/2016,  lasting up to 4.4 seconds. BB discontinued.   . Sleep apnea 02/2016   CPAP 16 TO 20  . Stroke [facility]) noted on CAT 02/2014   "light", LLE weakness remains (03/30/2014)  . 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 Tingling in extremities 12/12/2010   Uric acid and electrolytes (02/23) normal but WBC elevated.   D/Dx: carpal tunnel, ulnar neuropathy Less likely: cervical radiculopathy, vasculitis    Past Surgical History: Past Surgical History:  Procedure Laterality Date  . [place]  . CHOLECYSTECTOMY N/A 06/17/2017   Procedure: LAPAROSCOPIC CHOLECYSTECTOMY WITH INTRAOPERATIVE CHOLANGIOGRAM;  Surgeon: Donnie Mesa, MD;  Location: [facility];  Service: General;  Laterality: N/A;  . COLONOSCOPY WITH PROPOFOL N/A 04/28/2017   Procedure: COLONOSCOPY WITH PROPOFOL;  Surgeon: Manus Gunning, MD;  Location: [facility];  Service: Gastroenterology;  Laterality: N/A;  . DILATATION & CURETTAGE/HYSTEROSCOPY WITH MYOSURE N/A 05/06/2016   Procedure: DILATATION & CURETTAGE/HYSTEROSCOPY WITH MYOSURE;  Surgeon: Terrance Mass, MD;  Location: [facility];  Service: Gynecology;  Laterality: N/A;  request to follow around 8:45  requests one hour OR time  . DILATATION & CURETTAGE/HYSTEROSCOPY WITH MYOSURE N/A 04/03/2017   Procedure: DILATATION & CURETTAGE/HYSTEROSCOPY WITH MYOSURE;  Surgeon: Terrance Mass, MD;  Location: [facility];  Service: Gynecology;  Laterality: N/A;  . DILATION AND CURETTAGE OF UTERUS    . ESOPHAGOGASTRODUODENOSCOPY (EGD) WITH PROPOFOL N/A 04/28/2017   Procedure: ESOPHAGOGASTRODUODENOSCOPY (EGD) WITH PROPOFOL;  Surgeon: Manus Gunning, MD;  Location: [facility];  Service: Gastroenterology;  Laterality: N/A;  . FOOT SURGERY Bilateral    "took bones out; put pins in"  . LEFT AND RIGHT HEART CATHETERIZATION WITH CORONARY ANGIOGRAM N/A 03/31/2014   Procedure: LEFT AND RIGHT HEART CATHETERIZATION WITH CORONARY  ANGIOGRAM;  Surgeon: Birdie Riddle, MD;  Location: [facility];  Service: Cardiovascular;  Laterality: N/A;  . MYOMECTOMY  YRS AGO  . sonohystogram  04/14/2016   [facility]: intramural fibroid, premenopausal endometrium, right fluid filled tubular structure   Social History: Social History   Socioeconomic History  . Marital status: Divorced    Spouse name: Not on file  . Number of children: 1  . Years of education: Not on file  . Highest education level: Not on file  Occupational History  . Not on file  Social Needs  . Financial resource strain: Not on file  . Food insecurity:    Worry: Not on file    Inability: Not on file  . Transportation needs:    Medical: Not on file    Non-medical: Not on file  Tobacco Use  . Smoking status: Current Every Day Smoker    Packs/day: 0.50    Years: 40.00    Pack years: 20.00    Types: Cigarettes  . Smokeless tobacco: Never Used  . Tobacco comment: Would like to quit  Substance and Sexual Activity  . Alcohol use: No    Alcohol/week: 0.0 oz  . Drug use: No    Types: "Crack" cocaine    Comment: 03/30/2014 "stopped using crack 02/21/2014"  . Sexual activity: Yes    Birth control/protection: Post-menopausal  Lifestyle  . Physical activity:    Days per week: Not on file    Minutes per session: Not on file  . Stress: Not on file  Relationships  . Social connections:    Talks on phone: Not on file    Gets together: Not on file    Attends religious service: Not on file    Active member of club or organization: Not on file    Attends meetings of clubs or organizations: Not on file    Relationship status: Not on file  Other Topics Concern  . Not on file  Social History Narrative   Married 11/16/2009 to [place] whom she met at Rehab.    Enjoys church at [employer], [place] geared towards people with substance abuse history.       Financial assistance approved for 100% discount at [facility] and has [facility] card  [facility]   June 27, 2010 11:36 am   Family History: Family History  Problem Relation Age of Onset  . Hypertension Mother   . COPD Mother   . Hypertension Father   . Cancer Sister        UTERINE???  . COPD Sister   . Hypertension Sister   . Hypertension Sister    Allergies: No Known Allergies Medications: See med rec.  Review of Systems: No headache, visual changes, nausea, vomiting, diarrhea, constipation, dizziness, abdominal pain, skin rash, fevers, chills, night sweats, weight loss, swollen lymph nodes, body aches, joint swelling, muscle aches, chest pain, shortness of breath, mood changes, visual or auditory hallucinations.   Objective:   General: Well Developed, well nourished, and in no acute  distress.  Neuro:  Extra-ocular muscles intact, able to move all 4 extremities, sensation grossly intact.  Deep tendon reflexes tested were normal. Psych: Alert and oriented, mood congruent with affect. ENT:  Ears and nose appear unremarkable.  Hearing grossly normal. Neck: Unremarkable overall appearance, trachea midline.  No visible thyroid enlargement. Eyes: Conjunctivae and lids appear unremarkable.  Pupils equal and round. Skin: Warm and dry, no rashes noted.  Cardiovascular: Pulses palpable, no extremity edema. Right hand: Tender to palpation at the trapeziometacarpal joint, some postinjection hypopigmentation from the previous procedure. Right knee: Visibly swollen with a palpable fluid wave, effusion, tenderness at the medial joint line ROM normal in flexion and extension and lower leg rotation. Ligaments with solid consistent endpoints including ACL, PCL, LCL, MCL. Negative Mcmurray's and provocative meniscal tests. Non painful patellar compression. Patellar and quadriceps tendons unremarkable. Hamstring and quadriceps strength is normal.  Procedure: Real-time Ultrasound guided aspiration/injection of right knee Device: GE Logiq E  Verbal informed consent  obtained.  Time-out conducted.  Noted no overlying erythema, induration, or other signs of local infection.  Skin prepped in a sterile fashion.  Local anesthesia: Topical Ethyl chloride.  With sterile technique and under real time ultrasound guidance: Using 18-gauge needle advanced into the suprapatellar recess, aspirated 14 cc of straw-colored fluid, syringe switched and 1 cc kenalog 40, 2 cc lidocaine 2 cc bupivacaine injected easily. Completed without difficulty  Pain immediately resolved suggesting accurate placement of the medication.  Advised to call if fevers/chills, erythema, induration, drainage, or persistent bleeding.  Images permanently stored and available for review in the ultrasound unit.  Impression: Technically successful ultrasound guided injection.  Procedure: Real-time Ultrasound Guided Injection of right first [facility] joint Device: GE Logiq E  Verbal informed consent obtained.  Time-out conducted.  Noted no overlying erythema, induration, or other signs of local infection.  Skin prepped in a sterile fashion.  Local anesthesia: Topical Ethyl chloride.  With sterile technique and under real time ultrasound guidance: 25-gauge needle advanced into the joint, I injected meditation into the joint, she did have some discomfort with pressurization of the joint so some of the medication was placed periarticular for a total of 1/2 cc kenalog 40, 1/2 cc lidocaine. Completed without difficulty  Pain immediately resolved suggesting accurate placement of the medication.  Advised to call if fevers/chills, erythema, induration, drainage, or persistent bleeding.  Images permanently stored and available for review in the ultrasound unit.  Impression: Technically successful ultrasound guided injection.  Impression and Recommendations:   This case required medical decision making of moderate complexity.  Primary osteoarthritis of both first carpometacarpal joints Right CMC injection  today. Last injection was approximately 6 months ago. Return in a few weeks for left CMC injection.  Primary osteoarthritis of both knees Aspiration and injection of the right knee today. Return in a couple of weeks for the left. I am also going to get her approved for Visco supplementation.  Morbid obesity ([place]) Referral to bariatric surgery, she is a candidate for sleeve gastrectomy.   Weight loss medicines and nutrition therapy will probably not be sufficient, she needs to lose 150 pounds or so. This will probably cure many of her medical comorbidities, as well as keep her from having to have a knee replacement. ___________________________________________ Gwen Her. Dianah Field, M.D., ABFM., CAQSM. Primary Care and [place] Instructor of [place] of [facility]

## 2018-03-25 ENCOUNTER — Other Ambulatory Visit: Payer: Self-pay

## 2018-03-25 ENCOUNTER — Ambulatory Visit (INDEPENDENT_AMBULATORY_CARE_PROVIDER_SITE_OTHER): Payer: Medicare HMO | Admitting: Family Medicine

## 2018-03-25 ENCOUNTER — Telehealth: Payer: Self-pay | Admitting: Sports Medicine

## 2018-03-25 VITALS — BP 134/82 | HR 89 | Temp 98.2°F | Ht 63.0 in | Wt 291.4 lb

## 2018-03-25 DIAGNOSIS — B079 Viral wart, unspecified: Secondary | ICD-10-CM

## 2018-03-25 NOTE — Progress Notes (Signed)
Cryotherapy Procedure Note  CC: Persistent wart, desires removal  HPI Patient states wart has been present for about 2 years. She has tried OTC wart removal gel without much effect. She has picked at wart but is unable to pick out. It is firm and painful with pressure. Never had cryotherapy or surgical excision of it before.  Obj: firm, circular, symmetric 2x2cm wart on plantar surface of R first digit.  Pre-operative Diagnosis: cutaneous wart  Post-operative Diagnosis: cutaneous wart  Locations: Plantar surface of R first digit  Indications: chronic irritation  Anesthesia: not required    Procedure Details  Patient informed of risks (permanent scarring, infection, light or dark discoloration, bleeding, infection, weakness, numbness and recurrence of the lesion) and benefits of the procedure and written informed consent obtained.  The areas are treated with liquid nitrogen therapy, frozen until ice ball extended 2 mm beyond lesion, allowed to thaw, and treated again x2. The patient tolerated procedure well.  The patient was instructed on post-op care, warned that there may be blister formation, redness and pain. Recommend OTC analgesia as needed for pain.  Condition: Stable  Complications: pain.  Plan: 1. Instructed to keep the area dry and covered for 24-48h and clean thereafter. 2. Warning signs of infection were reviewed.   3. Recommended that the patient use OTC analgesics as needed for pain.  4. Return in 2 weeks.

## 2018-03-25 NOTE — Telephone Encounter (Signed)
-----   Message from Silverio Decamp, MD sent at 03/24/2018 10:18 AM EDT ----- Orthovisc approval please, both knees. ___________________________________________ Gwen Her. Dianah Field, M.D., ABFM., CAQSM. Primary Care and [place] Instructor of [place] of [facility]

## 2018-03-25 NOTE — Telephone Encounter (Signed)
Information has been submitted to Orthov[employer] and awaiting determination.

## 2018-03-25 NOTE — Patient Instructions (Addendum)
  We froze your finger wart today using cryotherapy. You might need 1-2 more treatments to get the expected result. Come back to the clinic in 2-3 weeks for additional treatment. If there is no improvement, we will consider hand specialist referral just because of the location of the wart.  Cryotherapy WHAT IS CRYOTHERAPY? Cryotherapy, or cold therapy, is a treatment that uses cold temperatures to treat an injury or medical condition. It includes using cold packs or ice packs to reduce pain and swelling. Only use cryotherapy if your doctor says it is okay. HOW DO I USE CRYOTHERAPY?  Place a towel between the cold source and your skin.  Apply the cold source for no more than 20 minutes at a time.  Check your skin after 5 minutes to make sure there are no signs of a poor response to cold or skin damage. Check for: ? White spots on your skin. Your skin may look blotchy or mottled. ? Skin that looks blue or pale. ? Skin that feels waxy or hard.  Repeat these steps as many times each day as told by your doctor.  HOW CAN I MAKE A COLD PACK? When using a cold pack at home to reduce pain and swelling, you can use:  A silica gel cold pack that has been left in the freezer. You can buy this online or in stores.  A plastic bag of frozen vegetables.  A sealable plastic bag that has been filled with crushed ice.  Always wrap the pack in a dry or damp towel to avoid direct contact with your skin. WHEN SHOULD I CALL MY DOCTOR? Call your doctor if:  You start to have white spots on your skin. This may give your skin a blotchy or mottled look.  Your skin turns blue or pale.  Your skin becomes waxy or hard.  Your swelling gets worse.  This information is not intended to replace advice given to you by your health care provider. Make sure you discuss any questions you have with your health care provider. Document Released: 03/24/2008 Document Revised: 03/13/2016 Document Reviewed:  06/20/2015 Elsevier Interactive Patient Education  2017 [employer].

## 2018-03-29 NOTE — Telephone Encounter (Signed)
Orthovisc is covered.  There is only a copay for the office visit. If collected at the time of service, the copay will be $35.00. The deductible does not apply. Patient has a 20% coinsurance of the allowable amount. Once the out of pocket is met, the patient will have no financial responsibility. Call reference number is [id number].   Patient is unable to pay the remainder that insurance will not cover. She will check back at a later date.  

## 2018-04-11 NOTE — Progress Notes (Deleted)
   Subjective:   Patient ID: [facility]    DOB: June 12, 1961, 57 y.o. female   MRN: [id number]  [facility] is a 57 y.o. female with a history of wart on finger here for   F/u for wart on finger - seen in derm clinic 6/6, received cryotherapy with instructions to RTC in 2-3 weeks for repeat cryotherapy.  Diabetes, Type 2 - Last A1c 7.1 5/22 - Medications: metformin 1021m BID - Compliance: *** - Checking BG at home: *** - Diet: *** - Exercise: *** - Eye exam: UTD - Foot exam: UTD - Microalbumin: *** - Denies symptoms of hypoglycemia, polyuria, polydipsia, numbness extremities, foot ulcers/trauma  Review of Systems:  Per HPI.   [place] medications and smoking status reviewed.  Objective:   LMP 04/06/2017  Vitals and nursing note reviewed.  General: well nourished, well developed, in no acute distress with non-toxic appearance HEENT: normocephalic, atraumatic, moist mucous membranes Neck: supple, non-tender without lymphadenopathy CV: regular rate and rhythm without murmurs, rubs, or gallops, no lower extremity edema Lungs: clear to auscultation bilaterally with normal work of breathing Abdomen: soft, non-tender, non-distended, no masses or organomegaly palpable, normoactive bowel sounds Skin: warm, dry, no rashes or lesions Extremities: warm and well perfused, normal tone MSK: ROM grossly intact, strength intact, gait normal Neuro: Alert and oriented, speech normal  Assessment & Plan:   No problem-specific Assessment & Plan notes found for this encounter.  No orders of the defined types were placed in this encounter.  No orders of the defined types were placed in this encounter.   ARory Percy DO PGY-1, [facility] 04/11/2018 3:19 PM

## 2018-04-12 ENCOUNTER — Ambulatory Visit: Payer: Medicare HMO | Admitting: Family Medicine

## 2018-04-13 ENCOUNTER — Ambulatory Visit (INDEPENDENT_AMBULATORY_CARE_PROVIDER_SITE_OTHER): Payer: Medicare HMO | Admitting: Sports Medicine

## 2018-04-13 ENCOUNTER — Encounter: Payer: Self-pay | Admitting: Sports Medicine

## 2018-04-13 DIAGNOSIS — M170 Bilateral primary osteoarthritis of knee: Secondary | ICD-10-CM

## 2018-04-13 DIAGNOSIS — M654 Radial styloid tenosynovitis [de Quervain]: Secondary | ICD-10-CM

## 2018-04-13 DIAGNOSIS — M180 Bilateral primary osteoarthritis of first carpometacarpal joints: Secondary | ICD-10-CM

## 2018-04-13 NOTE — Progress Notes (Signed)
Subjective:    CC: Knee pain, wrist pain, hand pain  HPI: Bilateral knee osteoarthritis: Right side is now pain-free after injection, desires to have the left side injected, pain is moderate, persistent, localized to the medial joint line without radiation.  Bilateral CMC osteoarthritis: Right side is pain-free after injection, desires injection on the left side today, pain is moderate, persistent, localized without radiation.  Right de Quervain's tendinitis: Previous injection was back in January, now having a recurrence of pain, moderate, persistent, localized over the radial aspect with radiation of the mid forearm.  I reviewed the past medical history, family history, social history, surgical history, and allergies today and no changes were needed.  Please see the problem list section below in epic for further details.  Past Medical History: Past Medical History:  Diagnosis Date  . Arthritis    "knees" (02/21/2014), back  . CHF (congestive heart failure) ([place])   . Chronic bronchitis ([place])    "get it q yr" (02/21/2014)  . Chronic diastolic CHF (congestive heart failure) ([place])    a. Echo 05/2014: EF 65-70% with Grade 1 DD.  Marland Kitchen Chronic lower back pain   . Complication of anesthesia    "I don't come out well; I chew on my tongue"  . COPD (chronic obstructive pulmonary disease) ([place])   . Diabetes mellitus without complication ([place])    TYPE 2  . Family history of adverse reaction to anesthesia    BROTHER PONV, had a blood clot several days later and heart stopped  . Fatty liver    NONALCOLIC  . Fibroid   . Heart murmur   . Hypertension   . Infection of right eye    current dx on Saturday 7/15 - using drops  . Leg swelling    bilateral  . Migraine 1982-2009  . MVP (mitral valve prolapse)   . Neuromuscular disorder ([place])    right leg nerve pain  . Shortness of breath    WITH EXERTION  . Sinus pause    a. noted on telemetry during admission from 10/2016, lasting up to 4.4  seconds. BB discontinued.   . Sleep apnea 02/2016   CPAP 16 TO 20  . Stroke [facility]) noted on CAT 02/2014   "light", LLE weakness remains (03/30/2014)  . 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 Tingling in extremities 12/12/2010   Uric acid and electrolytes (02/23) normal but WBC elevated.   D/Dx: carpal tunnel, ulnar neuropathy Less likely: cervical radiculopathy, vasculitis    Past Surgical History: Past Surgical History:  Procedure Laterality Date  . [place]  . CHOLECYSTECTOMY N/A 06/17/2017   Procedure: LAPAROSCOPIC CHOLECYSTECTOMY WITH INTRAOPERATIVE CHOLANGIOGRAM;  Surgeon: Donnie Mesa, MD;  Location: [place];  Service: General;  Laterality: N/A;  . COLONOSCOPY WITH PROPOFOL N/A 04/28/2017   Procedure: COLONOSCOPY WITH PROPOFOL;  Surgeon: Manus Gunning, MD;  Location: [facility];  Service: Gastroenterology;  Laterality: N/A;  . DILATATION & CURETTAGE/HYSTEROSCOPY WITH MYOSURE N/A 05/06/2016   Procedure: DILATATION & CURETTAGE/HYSTEROSCOPY WITH MYOSURE;  Surgeon: Terrance Mass, MD;  Location: [facility];  Service: Gynecology;  Laterality: N/A;  request to follow around 8:45  requests one hour OR time  . DILATATION & CURETTAGE/HYSTEROSCOPY WITH MYOSURE N/A 04/03/2017   Procedure: DILATATION & CURETTAGE/HYSTEROSCOPY WITH MYOSURE;  Surgeon: Terrance Mass, MD;  Location: [facility];  Service: Gynecology;  Laterality: N/A;  . DILATION AND CURETTAGE OF UTERUS    .  ESOPHAGOGASTRODUODENOSCOPY (EGD) WITH PROPOFOL N/A 04/28/2017   Procedure: ESOPHAGOGASTRODUODENOSCOPY (EGD) WITH PROPOFOL;  Surgeon: Manus Gunning, MD;  Location: [facility];  Service: Gastroenterology;  Laterality: N/A;  . FOOT SURGERY Bilateral    "took bones out; put pins in"  . LEFT AND RIGHT HEART CATHETERIZATION WITH CORONARY ANGIOGRAM N/A 03/31/2014   Procedure: LEFT AND RIGHT HEART CATHETERIZATION WITH CORONARY ANGIOGRAM;  Surgeon: Birdie Riddle, MD;  Location: [facility] [facility];  Service: Cardiovascular;  Laterality: N/A;  . MYOMECTOMY  YRS AGO  . sonohystogram  04/14/2016   [facility]: intramural fibroid, premenopausal endometrium, right fluid filled tubular structure   Social History: Social History   Socioeconomic History  . Marital status: Divorced    Spouse name: Not on file  . Number of children: 1  . Years of education: Not on file  . Highest education level: Not on file  Occupational History  . Not on file  Social Needs  . Financial resource strain: Not on file  . Food insecurity:    Worry: Not on file    Inability: Not on file  . Transportation needs:    Medical: Not on file    Non-medical: Not on file  Tobacco Use  . Smoking status: Current Every Day Smoker    Packs/day: 0.50    Years: 40.00    Pack years: 20.00    Types: Cigarettes  . Smokeless tobacco: Never Used  . Tobacco comment: Would like to quit  Substance and Sexual Activity  . Alcohol use: No    Alcohol/week: 0.0 oz  . Drug use: No    Types: "Crack" cocaine    Comment: 03/30/2014 "stopped using crack 02/21/2014"  . Sexual activity: Yes    Birth control/protection: Post-menopausal  Lifestyle  . Physical activity:    Days per week: Not on file    Minutes per session: Not on file  . Stress: Not on file  Relationships  . Social connections:    Talks on phone: Not on file    Gets together: Not on file    Attends religious service: Not on file    Active member of club or organization: Not on file    Attends meetings of clubs or organizations: Not on file    Relationship status: Not on file  Other Topics Concern  . Not on file  Social History Narrative   Married 11/16/2009 to [place] whom she met at Rehab.    Enjoys church at [employer], [place] geared towards people with substance abuse history.       Financial assistance approved for 100% discount at [facility] and has [facility] card Bonna Gains   June 27, 2010 11:36 am   Family History: Family History  Problem Relation Age of Onset  . Hypertension Mother   . COPD Mother   . Hypertension Father   . Cancer Sister        UTERINE???  . COPD Sister   . Hypertension Sister   . Hypertension Sister    Allergies: No Known Allergies Medications: See med rec.  Review of Systems: No fevers, chills, night sweats, weight loss, chest pain, or shortness of breath.   Objective:    General: Well Developed, well nourished, and in no acute distress.  Neuro: Alert and oriented x3, extra-ocular muscles intact, sensation grossly intact.  HEENT: Normocephalic, atraumatic, pupils equal round reactive to light, neck supple, no masses, no lymphadenopathy, thyroid nonpalpable.  Skin: Warm and dry, no rashes.  Cardiac: Regular rate and rhythm, no murmurs rubs or gallops, no lower extremity edema.  Respiratory: Clear to auscultation bilaterally. Not using accessory muscles, speaking in full sentences.  Procedure: Real-time Ultrasound Guided Injection of left knee Device: GE Logiq E  Verbal informed consent obtained.  Time-out conducted.  Noted no overlying erythema, induration, or other signs of local infection.  Skin prepped in a sterile fashion.  Local anesthesia: Topical Ethyl chloride.  With sterile technique and under real time ultrasound guidance: Using a 25-gauge needle advanced into the suprapatellar recess and injected 1 cc Kenalog 40, 2 cc lidocaine, 2 cc bupivacaine. Completed without difficulty  Pain immediately resolved suggesting accurate placement of the medication.  Advised to call if fevers/chills, erythema, induration, drainage, or persistent bleeding.  Images permanently stored and available for review in the ultrasound unit.  Impression: Technically successful ultrasound guided injection.  Procedure: Real-time Ultrasound Guided Injection of left first [facility] Device: GE Logiq E  Verbal informed consent obtained.  Time-out conducted.    Noted no overlying erythema, induration, or other signs of local infection.  Skin prepped in a sterile fashion.  Local anesthesia: Topical Ethyl chloride.  With sterile technique and under real time ultrasound guidance: 25-gauge needle advanced between the first metacarpal and trapezium, injected 1/2 cc kenalog 40, 1/2 cc lidocaine Completed without difficulty  Pain immediately resolved suggesting accurate placement of the medication.  Advised to call if fevers/chills, erythema, induration, drainage, or persistent bleeding.  Images permanently stored and available for review in the ultrasound unit.  Impression: Technically successful ultrasound guided injection.  Procedure: Real-time Ultrasound Guided Injection of right first extensor compartment Device: GE Logiq E  Verbal informed consent obtained.  Time-out conducted.  Noted no overlying erythema, induration, or other signs of local infection.  Skin prepped in a sterile fashion.  Local anesthesia: Topical Ethyl chloride.  With sterile technique and under real time ultrasound guidance: 25-gauge needle advanced between the abductor pollicis longus and extensor pollicis longus brevis, taking care to avoid intratendinous injection I injected 1 cc Kenalog 40, 1 cc lidocaine, 1 cc bupivacaine. Completed without difficulty  Pain immediately resolved suggesting accurate placement of the medication.  Advised to call if fevers/chills, erythema, induration, drainage, or persistent bleeding.  Images permanently stored and available for review in the ultrasound unit.  Impression: Technically successful ultrasound guided injection.  Impression and Recommendations:    Primary osteoarthritis of both knees Right knee pain-free after injection, left knee injection as above. Unable to afford the co-pay for Orthovisc.  Primary osteoarthritis of both first carpometacarpal joints Right first [place] is pain-free now after injection, left first CMC injection  today.  De Quervain's tenosynovitis, right First extensor compartment was injected back in January, recurrence of pain, injected today on the right. Return in 1 month. She does have a bit of postinjection hypopigmentation over the right first [facility] as expected.. ___________________________________________ Gwen Her. Dianah Field, M.D., ABFM., CAQSM. Primary Care and [place] Instructor of [place] of [facility] [employer] Medicine

## 2018-04-13 NOTE — Assessment & Plan Note (Signed)
First extensor compartment was injected back in January, recurrence of pain, injected today on the right. Return in 1 month. She does have a bit of postinjection hypopigmentation over the right first [facility] as expected.Marland Kitchen

## 2018-04-13 NOTE — Assessment & Plan Note (Signed)
Right knee pain-free after injection, left knee injection as above. Unable to afford the co-pay for Orthovisc.

## 2018-04-13 NOTE — Assessment & Plan Note (Signed)
Right first [place] is pain-free now after injection, left first [place] injection today.

## 2018-04-14 ENCOUNTER — Ambulatory Visit: Payer: Medicare HMO | Admitting: Sports Medicine

## 2018-04-24 ENCOUNTER — Other Ambulatory Visit: Payer: Self-pay | Admitting: Family Medicine

## 2018-04-30 NOTE — Progress Notes (Signed)
Subjective  Hailey Barnes is a 57 y.o. female is presenting with the following  Chief Complaint noted Review of Symptoms - see HPI PMH - Smoking status noted.    Objective Vital Signs reviewed LMP 04/17/2015   Assessments/Plans  See after visit summary for details of patient instuctions  No problem-specific Assessment & Plan notes found for this encounter.

## 2018-05-03 ENCOUNTER — Ambulatory Visit (INDEPENDENT_AMBULATORY_CARE_PROVIDER_SITE_OTHER): Payer: Medicare HMO | Admitting: Family Medicine

## 2018-05-03 ENCOUNTER — Other Ambulatory Visit: Payer: Self-pay

## 2018-05-03 ENCOUNTER — Encounter: Payer: Self-pay | Admitting: Family Medicine

## 2018-05-03 ENCOUNTER — Other Ambulatory Visit (HOSPITAL_COMMUNITY)
Admission: RE | Admit: 2018-05-03 | Discharge: 2018-05-03 | Disposition: A | Discharge status: Home / Self Care | Payer: Medicare HMO | Source: Ambulatory Visit | Attending: Family Medicine | Admitting: Family Medicine

## 2018-05-03 VITALS — BP 130/84 | HR 74 | Temp 98.7°F | Ht 63.0 in | Wt 292.0 lb

## 2018-05-03 DIAGNOSIS — N898 Other specified noninflammatory disorders of vagina: Secondary | ICD-10-CM | POA: Insufficient documentation

## 2018-05-03 DIAGNOSIS — R1030 Lower abdominal pain, unspecified: Secondary | ICD-10-CM | POA: Diagnosis not present

## 2018-05-03 LAB — POCT URINALYSIS DIP (MANUAL ENTRY)
Bilirubin, UA: NEGATIVE
Blood, UA: NEGATIVE
Glucose, UA: NEGATIVE mg/dL
Ketones, POC UA: NEGATIVE mg/dL
LEUKOCYTES UA: NEGATIVE
Nitrite, UA: NEGATIVE
PROTEIN UA: NEGATIVE mg/dL
Spec Grav, UA: 1.01 (ref 1.010–1.025)
UROBILINOGEN UA: 0.2 U/dL
pH, UA: 6 (ref 5.0–8.0)

## 2018-05-03 LAB — POCT WET PREP (WET MOUNT)
Clue Cells Wet Prep Whiff POC: NEGATIVE
Trichomonas Wet Prep HPF POC: ABSENT

## 2018-05-03 MED ORDER — POTASSIUM CHLORIDE ER 20 MEQ PO TBCR: 1.0000 | EXTENDED_RELEASE_TABLET | Freq: Every day | ORAL | 11 refills | Status: DC

## 2018-05-03 NOTE — Progress Notes (Signed)
Subjective:  Hailey Barnes is a 57 y.o. female who presents to the [facility] today with a chief complaint of lower abdominal pain and muscle cramping.   HPI:  Patient states that she has had lower abdominal pain/soreness and groin pain for the past 4 days.  She says that this feels like a soreness.  She says that she feels like the muscles on both sides of her belly have kind of caved in and been tight and cramping and feeling.  She also says that she has had cramping in her hands and feet.  She has not had good water intake recently and feels a bit dehydrated.  She does not have any dysuria but notices that after she pees that she does have some cramping in her lower belly.  She does not have any new urinary urgency or frequency as she is on fluid pills.  She has noticed some increased vaginal discharge over this last 4 days but no itching or burning or foul odor or change in color.  She has not been sexually active for the last 2 months and has no known STD exposures.  She has had regular bowel movements which are chronically loose ever since her cholecystectomy. Has not had any fevers or chills.  She has been eating well without nausea or vomiting although she is trying to eat less intentionally over the last few days.  ROS: Per HPI  Objective:  Physical Exam: BP 130/84   Pulse 74   Temp 98.7 F (37.1 C) (Oral)   Ht 5' 3"  (1.6 m)   Wt 292 lb (132.5 kg)   LMP 04/06/2017   SpO2 98%   BMI 51.73 kg/m   Gen: NAD, resting comfortably CV: RRR with no murmurs appreciated Pulm: NWOB, CTAB with no crackles, wheezes, or rhonchi GI: Normal bowel sounds present. Soft, Nontender, Nondistended.  No CVA tenderness Pelvic:Normal external female genitalia.  Normal vaginal rugae and discharge.  Cervix is visualized.  No cervical motion tenderness MSK: no edema, cyanosis, or clubbing noted Skin: warm, dry Neuro: grossly normal, moves all extremities Psych: Normal affect and thought content  Results  for orders placed or performed in visit on 05/03/18 (from the past 72 hour(s))  POCT urinalysis dipstick     Status: None   Collection Time: 05/03/18  4:30 PM  Result Value Ref Range   Color, UA yellow yellow   Clarity, UA clear clear   Glucose, UA negative negative mg/dL   Bilirubin, UA negative negative   Ketones, POC UA negative negative mg/dL   Spec Grav, UA 1.010 1.010 - 1.025   Blood, UA negative negative   pH, UA 6.0 5.0 - 8.0   Protein Ur, POC negative negative mg/dL   Urobilinogen, UA 0.2 0.2 or 1.0 E.U./dL   Nitrite, UA Negative Negative   Leukocytes, UA Negative Negative  POCT Wet Prep [place])     Status: Abnormal   Collection Time: 05/03/18  4:39 PM  Result Value Ref Range   Source Wet Prep POC VAG    WBC, Wet Prep HPF POC 1-5    Bacteria Wet Prep HPF POC Many (A) Few   Clue Cells Wet Prep HPF POC None None   Clue Cells Wet Prep Whiff POC Negative Whiff    Yeast Wet Prep HPF POC None None   KOH Wet Prep POC None None   Trichomonas Wet Prep HPF POC Absent Absent     Assessment/Plan:  1. Lower abdominal pain  Most likely muscle cramping due to dehydration as patient has had cramping of her abdominal muscles as well as her feet hands and legs in the setting of poor water intake.  Unlikely to be UTI as UA does not show any nitrates or leukocytes and patient does not have dysuria or hematuria or any other new urinary symptoms.  Wet prep was also negative.  GC/chlamydia collected today.   Abdominal exam is benign unlikely to be an acute intra-abdominal pathology and patient has been tolerating food well by mouth.  Discussed return precautions with patient and recommend good water intake.   Bufford Lope, DO PGY-3, [facility] 05/03/2018 4:33 PM

## 2018-05-03 NOTE — Patient Instructions (Signed)
Drink lots of water.   Abdominal Pain, Adult Many things can cause belly (abdominal) pain. Most times, belly pain is not dangerous. Many cases of belly pain can be watched and treated at home. Sometimes belly pain is serious, though. Your doctor will try to find the cause of your belly pain. Follow these instructions at home:  Take over-the-counter and prescription medicines only as told by your doctor. Do not take medicines that help you poop (laxatives) unless told to by your doctor.  Drink enough fluid to keep your pee (urine) clear or pale yellow.  Watch your belly pain for any changes.  Keep all follow-up visits as told by your doctor. This is important. Contact a doctor if:  Your belly pain changes or gets worse.  You are not hungry, or you lose weight without trying.  You are having trouble pooping (constipated) or have watery poop (diarrhea) for more than 2-3 days.  You have pain when you pee or poop.  Your belly pain wakes you up at night.  Your pain gets worse with meals, after eating, or with certain foods.  You are throwing up and cannot keep anything down.  You have a fever. Get help right away if:  Your pain does not go away as soon as your doctor says it should.  You cannot stop throwing up.  Your pain is only in areas of your belly, such as the right side or the left lower part of the belly.  You have bloody or black poop, or poop that looks like tar.  You have very bad pain, cramping, or bloating in your belly.  You have signs of not having enough fluid or water in your body (dehydration), such as: ? Dark pee, very little pee, or no pee. ? Cracked lips. ? Dry mouth. ? Sunken eyes. ? Sleepiness. ? Weakness. This information is not intended to replace advice given to you by your health care provider. Make sure you discuss any questions you have with your health care provider. Document Released: 03/24/2008 Document Revised: 04/25/2016 Document  Reviewed: 03/19/2016 Elsevier Interactive Patient Education  2018 [employer].

## 2018-05-04 LAB — CERVICOVAGINAL ANCILLARY ONLY
CHLAMYDIA, DNA PROBE: NEGATIVE
NEISSERIA GONORRHEA: NEGATIVE

## 2018-05-05 ENCOUNTER — Encounter: Payer: Self-pay | Admitting: Family Medicine

## 2018-05-13 ENCOUNTER — Ambulatory Visit (INDEPENDENT_AMBULATORY_CARE_PROVIDER_SITE_OTHER): Payer: Medicare HMO | Admitting: Sports Medicine

## 2018-05-13 ENCOUNTER — Encounter: Payer: Self-pay | Admitting: Sports Medicine

## 2018-05-13 DIAGNOSIS — G8929 Other chronic pain: Secondary | ICD-10-CM | POA: Diagnosis not present

## 2018-05-13 DIAGNOSIS — M25562 Pain in left knee: Secondary | ICD-10-CM | POA: Diagnosis not present

## 2018-05-13 DIAGNOSIS — M170 Bilateral primary osteoarthritis of knee: Secondary | ICD-10-CM | POA: Diagnosis not present

## 2018-05-13 MED ORDER — TRAMADOL HCL 50 MG PO TABS: 50.0000 mg | ORAL_TABLET | Freq: Three times a day (TID) | ORAL | 0 refills | Status: DC | PRN

## 2018-05-13 NOTE — Progress Notes (Signed)
Subjective:    CC: Follow-up  HPI: Hailey Barnes returns to recheck her arthritic processes, she at this point has had bilateral de Quervain's injections, bilateral CMC injections both of which are doing well, she had bilateral knee injections, right knee is doing okay, the left knee still hurts significantly.  No mechanical symptoms, pain is at the posterior medial joint line with significant swelling.  I reviewed the past medical history, family history, social history, surgical history, and allergies today and no changes were needed.  Please see the problem list section below in epic for further details.  Past Medical History: Past Medical History:  Diagnosis Date  . Arthritis    "knees" (02/21/2014), back  . CHF (congestive heart failure) ([place])   . Chronic bronchitis ([place])    "get it q yr" (02/21/2014)  . Chronic diastolic CHF (congestive heart failure) ([place])    a. Echo 05/2014: EF 65-70% with Grade 1 DD.  Marland Kitchen Chronic lower back pain   . Complication of anesthesia    "I don't come out well; I chew on my tongue"  . COPD (chronic obstructive pulmonary disease) ([place])   . Diabetes mellitus without complication ([place])    TYPE 2  . Family history of adverse reaction to anesthesia    BROTHER PONV, had a blood clot several days later and heart stopped  . Fatty liver    NONALCOLIC  . Fibroid   . Heart murmur   . Hypertension   . Infection of right eye    current dx on Saturday 7/15 - using drops  . Leg swelling    bilateral  . Migraine 1982-2009  . MVP (mitral valve prolapse)   . Neuromuscular disorder ([place])    right leg nerve pain  . Shortness of breath    WITH EXERTION  . Sinus pause    a. noted on telemetry during admission from 10/2016, lasting up to 4.4 seconds. BB discontinued.   . Sleep apnea 02/2016   CPAP 16 TO 20  . Stroke [facility]) noted on CAT 02/2014   "light", LLE weakness remains (03/30/2014)  . 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 Tingling in extremities 12/12/2010   Uric acid and electrolytes (02/23) normal but WBC elevated.   D/Dx: carpal tunnel, ulnar neuropathy Less likely: cervical radiculopathy, vasculitis    Past Surgical History: Past Surgical History:  Procedure Laterality Date  . [place]  . CHOLECYSTECTOMY N/A 06/17/2017   Procedure: LAPAROSCOPIC CHOLECYSTECTOMY WITH INTRAOPERATIVE CHOLANGIOGRAM;  Surgeon: Donnie Mesa, MD;  Location: [place];  Service: General;  Laterality: N/A;  . COLONOSCOPY WITH PROPOFOL N/A 04/28/2017   Procedure: COLONOSCOPY WITH PROPOFOL;  Surgeon: Manus Gunning, MD;  [facility];  Service: Gastroenterology;  Laterality: N/A;  . DILATATION & CURETTAGE/HYSTEROSCOPY WITH MYOSURE N/A 05/06/2016   Procedure: DILATATION & CURETTAGE/HYSTEROSCOPY WITH MYOSURE;  Surgeon: Terrance Mass, MD;  Location: [place] [facility];  Service: Gynecology;  Laterality: N/A;  request to follow around 8:45  requests one hour OR time  . DILATATION & CURETTAGE/HYSTEROSCOPY WITH MYOSURE N/A 04/03/2017   Procedure: DILATATION & CURETTAGE/HYSTEROSCOPY WITH MYOSURE;  Surgeon: Terrance Mass, MD;  Location: [facility];  Service: Gynecology;  Laterality: N/A;  . DILATION AND CURETTAGE OF UTERUS    . ESOPHAGOGASTRODUODENOSCOPY (EGD) WITH PROPOFOL N/A 04/28/2017   Procedure: ESOPHAGOGASTRODUODENOSCOPY (EGD) WITH PROPOFOL;  Surgeon: Manus Gunning, MD;  [facility];  Service: Gastroenterology;  Laterality: N/A;  . FOOT SURGERY Bilateral    "took bones out; put pins in"  . LEFT AND RIGHT HEART CATHETERIZATION WITH CORONARY ANGIOGRAM N/A 03/31/2014   Procedure: LEFT AND RIGHT HEART CATHETERIZATION WITH CORONARY ANGIOGRAM;  Surgeon: Birdie Riddle, MD;  Location: [place] [facility];  Service: Cardiovascular;  Laterality: N/A;  . MYOMECTOMY  YRS AGO  . sonohystogram  04/14/2016   [facility]: intramural fibroid, premenopausal endometrium, right fluid  filled tubular structure   Social History: Social History   Socioeconomic History  . Marital status: Divorced    Spouse name: Not on file  . Number of children: 1  . Years of education: Not on file  . Highest education level: Not on file  Occupational History  . Not on file  Social Needs  . Financial resource strain: Not on file  . Food insecurity:    Worry: Not on file    Inability: Not on file  . Transportation needs:    Medical: Not on file    Non-medical: Not on file  Tobacco Use  . Smoking status: Current Every Day Smoker    Packs/day: 1.00    Years: 40.00    Pack years: 40.00    Types: Cigarettes  . Smokeless tobacco: Never Used  Substance and Sexual Activity  . Alcohol use: No    Alcohol/week: 0.0 oz  . Drug use: No    Types: "Crack" cocaine    Comment: 03/30/2014 "stopped using crack 02/21/2014"  . Sexual activity: Yes    Birth control/protection: Post-menopausal  Lifestyle  . Physical activity:    Days per week: Not on file    Minutes per session: Not on file  . Stress: Not on file  Relationships  . Social connections:    Talks on phone: Not on file    Gets together: Not on file    Attends religious service: Not on file    Active member of club or organization: Not on file    Attends meetings of clubs or organizations: Not on file    Relationship status: Not on file  Other Topics Concern  . Not on file  Social History Narrative   Married 11/16/2009 to [place] whom she met at Rehab.    Enjoys church at [employer], [place] [employer] geared towards people with substance abuse history.       Financial assistance approved for 100% discount at [facility] and has [facility] card Bonna Gains   June 27, 2010 11:36 am   Family History: Family History  Problem Relation Age of Onset  . Hypertension Mother   . COPD Mother   . Hypertension Father   . Cancer Sister        UTERINE???  . COPD Sister   . Hypertension Sister   . Hypertension Sister    Allergies: No  Known Allergies Medications: See med rec.  Review of Systems: No fevers, chills, night sweats, weight loss, chest pain, or shortness of breath.   Objective:    General: Well Developed, well nourished, and in no acute distress.  Neuro: Alert and oriented x3, extra-ocular muscles intact, sensation grossly intact.  HEENT: Normocephalic, atraumatic, pupils equal round reactive to light, neck supple, no masses, no lymphadenopathy, thyroid nonpalpable.  Skin: Warm and dry, no rashes. Cardiac: Regular rate and rhythm, no murmurs rubs or gallops, no lower extremity edema.  Respiratory: Clear to auscultation bilaterally. Not using accessory muscles, speaking in full sentences. Left knee: Mild to moderate effusion, tenderness  at the medial joint line ROM normal in flexion and extension and lower leg rotation. Ligaments with solid consistent endpoints including ACL, PCL, LCL, MCL. Negative Mcmurray's and provocative meniscal tests. Non painful patellar compression. Patellar and quadriceps tendons unremarkable. Hamstring and quadriceps strength is normal.  Procedure: Real-time Ultrasound Guided aspiration of left knee Device: GE Logiq E  Verbal informed consent obtained.  Time-out conducted.  Noted no overlying erythema, induration, or other signs of local infection.  Skin prepped in a sterile fashion.  Local anesthesia: Topical Ethyl chloride.  With sterile technique and under real time ultrasound guidance: Using an 18-gauge needle I aspirated approximately 15 to 20 cc of clear, straw-colored fluid Completed without difficulty  Pain immediately resolved suggesting accurate placement of the medication.  Advised to call if fevers/chills, erythema, induration, drainage, or persistent bleeding.  Images permanently stored and available for review in the ultrasound unit.  Impression: Technically successful ultrasound guided injection.  Impression and Recommendations:    Primary osteoarthritis of  both knees Right knee continues to be pain-free, left knee is still painful and swollen with an effusion after injection at the last visit. Arthrocentesis today, tramadol for pain and MRI to evaluate for surgical lesion. ___________________________________________ Gwen Her. Dianah Field, M.D., ABFM., CAQSM. Primary Care and [place] Instructor of [place] of [facility] of Medicine

## 2018-05-13 NOTE — Assessment & Plan Note (Addendum)
Right knee continues to be pain-free, left knee is still painful and swollen with an effusion after injection at the last visit. Arthrocentesis today, tramadol for pain and MRI to evaluate for surgical lesion.

## 2018-05-24 ENCOUNTER — Ambulatory Visit (INDEPENDENT_AMBULATORY_CARE_PROVIDER_SITE_OTHER): Payer: Medicare HMO

## 2018-05-24 DIAGNOSIS — M23222 Derangement of posterior horn of medial meniscus due to old tear or injury, left knee: Secondary | ICD-10-CM

## 2018-05-24 DIAGNOSIS — M7122 Synovial cyst of popliteal space [Baker], left knee: Secondary | ICD-10-CM | POA: Diagnosis not present

## 2018-05-24 DIAGNOSIS — M1712 Unilateral primary osteoarthritis, left knee: Secondary | ICD-10-CM

## 2018-05-24 DIAGNOSIS — M25562 Pain in left knee: Secondary | ICD-10-CM | POA: Diagnosis not present

## 2018-05-26 ENCOUNTER — Telehealth: Payer: Self-pay

## 2018-05-26 DIAGNOSIS — M170 Bilateral primary osteoarthritis of knee: Secondary | ICD-10-CM

## 2018-05-26 NOTE — Telephone Encounter (Signed)
Patient is having a lot of knee pain. She has appointment next week and would like something for the pain until then. Please advise.

## 2018-05-27 MED ORDER — TRAMADOL HCL 50 MG PO TABS: 50.0000 mg | ORAL_TABLET | Freq: Three times a day (TID) | ORAL | 0 refills | Status: DC | PRN

## 2018-05-27 NOTE — Telephone Encounter (Signed)
Adding a bit of tramadol.

## 2018-05-28 ENCOUNTER — Telehealth: Payer: Self-pay

## 2018-05-28 NOTE — Telephone Encounter (Signed)
Continue to use the tramadol for now 3 times a day, I can refill this if needed, we can also try referral to a pain management doctor for discussion of Coolief radiofrequency ablation of the genicular nerves that supply the knee.

## 2018-05-28 NOTE — Telephone Encounter (Signed)
Patient left message requesting referral to a foot specialist/podiatrist at [facility]. States she has had a pin in left foot x 20 years and is having a lot of pain in the area.  Call back is [phone]  Danley Danker, RN [facility])

## 2018-05-28 NOTE — Telephone Encounter (Signed)
Pt's daughter called clinic to get some more info regarding the OrthoVisc injections. States her mother is on a fixed income and was quotes an estimated OOP cost of about $900.  Reviewed the OV benefits investigation sheet with Pt's daughter and advised of the 20% patient coinsurance. Went over pricing of Rx and of the administration fee. Estimated OOP was higher than original quote. Pt's daughter was provided with J Code and CPT code for injections. She is going to contact [employer] with further questions.   Daughter reports her mother is at the pain level where it is affecting her quality of life. States the Pt doesn't want to proceed with Ortho Surgery referral at this time, and the steroid injections aren't helping as long as they used to - but being on a fixed income she is unable to afford the OV injections.   Questions what other steps there are. Routing to Provider for review.

## 2018-05-31 NOTE — Telephone Encounter (Signed)
Spoke with patient regarding pain. No prior documentation of foot surgery or xrays of L foot. States she had surgery 20 years ago and doesn't remember the name of the surgeon who performed her surgery. Denies fevers. Endorses pain and swelling for the past few days. Encouraged her to make an appointment in our clinic to determine best course of action.

## 2018-05-31 NOTE — Telephone Encounter (Signed)
Pt advised. She will think on this and discuss in more detail at upcoming appt

## 2018-05-31 NOTE — Telephone Encounter (Signed)
Patient advised.

## 2018-06-01 ENCOUNTER — Other Ambulatory Visit: Payer: Self-pay

## 2018-06-01 ENCOUNTER — Ambulatory Visit (INDEPENDENT_AMBULATORY_CARE_PROVIDER_SITE_OTHER): Payer: Medicare HMO | Admitting: Family Medicine

## 2018-06-01 VITALS — BP 146/92 | HR 86 | Temp 98.3°F | Ht 62.5 in | Wt 287.2 lb

## 2018-06-01 DIAGNOSIS — J441 Chronic obstructive pulmonary disease with (acute) exacerbation: Secondary | ICD-10-CM | POA: Diagnosis not present

## 2018-06-01 MED ORDER — PREDNISONE 50 MG PO TABS: 50.0000 mg | ORAL_TABLET | Freq: Every day | ORAL | 0 refills | Status: DC

## 2018-06-01 MED ORDER — DOXYCYCLINE HYCLATE 100 MG PO CAPS: 100.0000 mg | ORAL_CAPSULE | Freq: Two times a day (BID) | ORAL | 0 refills | Status: DC

## 2018-06-01 MED ORDER — ACCU-CHEK SOFTCLIX LANCETS MISC: 12 refills | Status: DC

## 2018-06-01 NOTE — Patient Instructions (Signed)
It was nice meeting you today [name]!  I am prescribing prednisone (take one pill per day for 5 days) and doxycycline (an antibiotic - take one pill twice per day for 5 days).  If you do not feel better by next week, please come back to the clinic.  Please also use your albuterol inhaler to help your breathing and reduce your smoking as much as possible.  If you have any questions or concerns, please feel free to call the clinic.   Be well,  Dr. Shan Levans

## 2018-06-01 NOTE — Progress Notes (Signed)
Subjective:    Hailey Barnes - 57 y.o. female MRN [id number]  Date of birth: November 11, 1960  HPI  Hailey Barnes is here for a possible COPD exacerbation.  Her symptoms have been going on for the past 4 days.  She has had a "terrible taste in my mouth" and is coughing up mucus that is brown and white in color.  She has had increase in mucus production and increase in cough over the past 4 days.  She continues to take [facility] and Spiriva as prescribed.  Has not taken any albuterol during the past 4 days because she says she just did not think about it.  Has been smoking up to 1 pack/day lately, but has not been able to smoke much during the past 4 days due to her worsening shortness of breath.  Will become short of breath with walking around the house.  Has felt fatigued and rundown as well.  Has needed to be hospitalized in the past for COPD exacerbations.   Health Maintenance:  Health Maintenance Due  Topic Date Due  . URINE MICROALBUMIN  03/04/1973  . TETANUS/TDAP  03/04/1982  . COLON CANCER SCREENING ANNUAL FOBT  04/28/2018  . INFLUENZA VACCINE  05/20/2018    -  reports that she has been smoking cigarettes. She has a 40.00 pack-year smoking history. She has never used smokeless tobacco. - Review of Systems: Per HPI. - Past Medical History: Patient Active Problem List   Diagnosis Date Noted  . Primary osteoarthritis of both knees 03/24/2018  . Primary osteoarthritis of both first carpometacarpal joints 11/16/2017  . De Quervain's tenosynovitis, right 11/02/2017  . Bilateral carpal tunnel syndrome 10/21/2017  . Dysuria 09/22/2017  . High risk sexual behavior 08/27/2017  . Benign neoplasm of sigmoid colon   . Insomnia 02/17/2017  . OSA (obstructive sleep apnea) 10/30/2016  . Sinus pause 10/23/2016  . Panic attack 10/22/2016  . Lower extremity edema   . Lumbar radiculopathy 06/02/2016  . Hyperlipidemia 02/07/2016  . T2DM (type 2 diabetes mellitus) ([place]) 12/07/2015  . Occipital  lymphadenopathy 09/21/2015  . Depression 04/19/2015  . COPD exacerbation ([place]) 10/06/2014  . Eczema 08/01/2014  . Diminished vision 06/23/2014  . Chronic diastolic CHF (congestive heart failure) ([place]) 06/13/2014  . Restrictive lung disease 05/10/2014  . Vaginal discharge 03/22/2014  . NASH (nonalcoholic steatohepatitis) 01/04/2014  . Hypertension 05/26/2010  . Morbid obesity ([place]) 12/17/2006  . Tobacco abuse 12/17/2006   - Medications: reviewed and updated   Objective:   Physical Exam BP (!) 146/92   Pulse 86   Temp 98.3 F (36.8 C) (Oral)   Ht 5' 2.5" (1.588 m)   Wt 287 lb 3.2 oz (130.3 kg)   LMP 04/06/2017   SpO2 95%   BMI 51.69 kg/m  Gen: NAD, alert, morbidly obese, appears fatigued CV: RRR, good S1/S2, no murmur Resp: no respiratory distress, reduced air movement, scattering wheezing, frequent cough  Assessment & Plan:   COPD exacerbation ([place]) History and exam consistent with COPD exacerbation.  Patient prescribed prednisone 50 mg daily for 5 days and doxycycline 100 mg twice daily for 5 days.  Patient encouraged to continue her Dulera and Spiriva and to take albuterol as needed for shortness of breath.  She was advised to return to clinic to be seen next week if her symptoms have not improved.  She was also highly encouraged to continue cutting down on cigarettes and reminded that we have resources here if she would like extra help  with quitting.    Maia Breslow, M.D. 06/01/2018, 4:24 PM PGY-2, [place]

## 2018-06-01 NOTE — Assessment & Plan Note (Addendum)
History and exam consistent with COPD exacerbation.  Patient prescribed prednisone 50 mg daily for 5 days and doxycycline 100 mg twice daily for 5 days.  Patient encouraged to continue her Dulera and Spiriva and to take albuterol as needed for shortness of breath.  She was advised to return to clinic to be seen next week if her symptoms have not improved.  She was also highly encouraged to continue cutting down on cigarettes and reminded that we have resources here if she would like extra help with quitting.

## 2018-06-02 ENCOUNTER — Encounter: Payer: Self-pay | Admitting: Sports Medicine

## 2018-06-02 ENCOUNTER — Ambulatory Visit (INDEPENDENT_AMBULATORY_CARE_PROVIDER_SITE_OTHER): Payer: Medicare HMO | Admitting: Sports Medicine

## 2018-06-02 DIAGNOSIS — M170 Bilateral primary osteoarthritis of knee: Secondary | ICD-10-CM | POA: Diagnosis not present

## 2018-06-02 NOTE — Progress Notes (Signed)
Subjective:    CC: Left knee pain  HPI: This is a pleasant 57 year old female, she has bilateral knee osteoarthritis, we injected both knees, her right knee is done extremely well.  Pain-free.  Unfortunately she continues to have pain in the left knee in spite of an aspiration and injection.  MRI showed meniscal tear, she only has a few mechanical symptoms but she does have significant buckling and giving way.  Tramadol provides minimal relief, she is agreeable to proceed with surgical evaluation.  I reviewed the past medical history, family history, social history, surgical history, and allergies today and no changes were needed.  Please see the problem list section below in epic for further details.  Past Medical History: Past Medical History:  Diagnosis Date  . Arthritis    "knees" (02/21/2014), back  . CHF (congestive heart failure) ([place])   . Chronic bronchitis ([place])    "get it q yr" (02/21/2014)  . Chronic diastolic CHF (congestive heart failure) ([place])    a. Echo 05/2014: EF 65-70% with Grade 1 DD.  Marland Kitchen Chronic lower back pain   . Complication of anesthesia    "I don't come out well; I chew on my tongue"  . COPD (chronic obstructive pulmonary disease) ([place])   . Diabetes mellitus without complication ([place])    TYPE 2  . Family history of adverse reaction to anesthesia    BROTHER PONV, had a blood clot several days later and heart stopped  . Fatty liver    NONALCOLIC  . Fibroid   . Heart murmur   . Hypertension   . Infection of right eye    current dx on Saturday 7/15 - using drops  . Leg swelling    bilateral  . Migraine 1982-2009  . MVP (mitral valve prolapse)   . Neuromuscular disorder ([place])    right leg nerve pain  . Shortness of breath    WITH EXERTION  . Sinus pause    a. noted on telemetry during admission from 10/2016, lasting up to 4.4 seconds. BB discontinued.   . Sleep apnea 02/2016   CPAP 16 TO 20  . Stroke [facility]) noted on CAT 02/2014   "light", LLE weakness  remains (03/30/2014)  . Substance abuse ([place])    s/p Rehab. 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 Tingling in extremities 12/12/2010   Uric acid and electrolytes (02/23) normal but WBC elevated.   D/Dx: carpal tunnel, ulnar neuropathy Less likely: cervical radiculopathy, vasculitis    Past Surgical History: Past Surgical History:  Procedure Laterality Date  . [place]  . CHOLECYSTECTOMY N/A 06/17/2017   Procedure: LAPAROSCOPIC CHOLECYSTECTOMY WITH INTRAOPERATIVE CHOLANGIOGRAM;  Surgeon: Donnie Mesa, MD;  Location: [place];  Service: General;  Laterality: N/A;  . COLONOSCOPY WITH PROPOFOL N/A 04/28/2017   Procedure: COLONOSCOPY WITH PROPOFOL;  Surgeon: Manus Gunning, MD;  [facility];  Service: Gastroenterology;  Laterality: N/A;  . DILATATION & CURETTAGE/HYSTEROSCOPY WITH MYOSURE N/A 05/06/2016   Procedure: DILATATION & CURETTAGE/HYSTEROSCOPY WITH MYOSURE;  Surgeon: Terrance Mass, MD;  Location: [facility] [facility];  Service: Gynecology;  Laterality: N/A;  request to follow around 8:45  requests one hour OR time  . DILATATION & CURETTAGE/HYSTEROSCOPY WITH MYOSURE N/A 04/03/2017   Procedure: DILATATION & CURETTAGE/HYSTEROSCOPY WITH MYOSURE;  Surgeon: Terrance Mass, MD;  Location: [place] [facility];  Service: Gynecology;  Laterality: N/A;  . DILATION AND CURETTAGE OF UTERUS    . ESOPHAGOGASTRODUODENOSCOPY (EGD) WITH PROPOFOL N/A 04/28/2017  Procedure: ESOPHAGOGASTRODUODENOSCOPY (EGD) WITH PROPOFOL;  Surgeon: Manus Gunning, MD;  [facility];  Service: Gastroenterology;  Laterality: N/A;  . FOOT SURGERY Bilateral    "took bones out; put pins in"  . LEFT AND RIGHT HEART CATHETERIZATION WITH CORONARY ANGIOGRAM N/A 03/31/2014   Procedure: LEFT AND RIGHT HEART CATHETERIZATION WITH CORONARY ANGIOGRAM;  Surgeon: Birdie Riddle, MD;  Location: [place] CATH LAB;  Service: Cardiovascular;  Laterality: N/A;  . MYOMECTOMY  YRS AGO  . sonohystogram   04/14/2016   [facility]: intramural fibroid, premenopausal endometrium, right fluid filled tubular structure   Social History: Social History   Socioeconomic History  . Marital status: Divorced    Spouse name: Not on file  . Number of children: 1  . Years of education: Not on file  . Highest education level: Not on file  Occupational History  . Not on file  Social Needs  . Financial resource strain: Not on file  . Food insecurity:    Worry: Not on file    Inability: Not on file  . Transportation needs:    Medical: Not on file    Non-medical: Not on file  Tobacco Use  . Smoking status: Current Every Day Smoker    Packs/day: 1.00    Years: 40.00    Pack years: 40.00    Types: Cigarettes  . Smokeless tobacco: Never Used  Substance and Sexual Activity  . Alcohol use: No    Alcohol/week: 0.0 standard drinks  . Drug use: No    Types: "Crack" cocaine    Comment: 03/30/2014 "stopped using crack 02/21/2014"  . Sexual activity: Yes    Birth control/protection: Post-menopausal  Lifestyle  . Physical activity:    Days per week: Not on file    Minutes per session: Not on file  . Stress: Not on file  Relationships  . Social connections:    Talks on phone: Not on file    Gets together: Not on file    Attends religious service: Not on file    Active member of club or organization: Not on file    Attends meetings of clubs or organizations: Not on file    Relationship status: Not on file  Other Topics Concern  . Not on file  Social History Narrative   Married 11/16/2009 to [place] whom she met at Rehab.    Enjoys church at [employer], [place] [employer] geared towards people with substance abuse history.       Financial assistance approved for 100% discount at [facility] and has [facility] card Bonna Gains   June 27, 2010 11:36 am   Family History: Family History  Problem Relation Age of Onset  . Hypertension Mother   . COPD Mother   . Hypertension Father   .  Cancer Sister        UTERINE???  . COPD Sister   . Hypertension Sister   . Hypertension Sister    Allergies: No Known Allergies Medications: See med rec.  Review of Systems: No fevers, chills, night sweats, weight loss, chest pain, or shortness of breath.   Objective:    General: Well Developed, well nourished, and in no acute distress.  Neuro: Alert and oriented x3, extra-ocular muscles intact, sensation grossly intact.  HEENT: Normocephalic, atraumatic, pupils equal round reactive to light, neck supple, no masses, no lymphadenopathy, thyroid nonpalpable.  Skin: Warm and dry, no rashes. Cardiac: Regular rate and rhythm, no murmurs rubs or gallops, no lower extremity edema.  Respiratory: Clear to auscultation bilaterally. Not using accessory muscles, speaking in full sentences.  Impression and Recommendations:    Primary osteoarthritis of both knees Right knee continues to be pain-free after injection several months ago. Left knee was persistently painful, in spite of aspiration and injection, MRI does show a meniscal tear, she also has osteoarthritis. We did discuss the limitations of arthroscopic meniscal debridement in the setting of osteoarthritis, but considering her acute pain I think it is certainly worth a try, referral to Dr. Berenice Primas. She does have tramadol for breakthrough pain. The knee was strapped with a compressive dressing today.  I spent 25 minutes with this patient, greater than 50% was face-to-face time counseling regarding the above diagnoses ___________________________________________ Gwen Her. Dianah Field, M.D., ABFM., CAQSM. Primary Care and [place] Instructor of [place] of [facility] of Medicine

## 2018-06-02 NOTE — Assessment & Plan Note (Signed)
Right knee continues to be pain-free after injection several months ago. Left knee was persistently painful, in spite of aspiration and injection, MRI does show a meniscal tear, she also has osteoarthritis. We did discuss the limitations of arthroscopic meniscal debridement in the setting of osteoarthritis, but considering her acute pain I think it is certainly worth a try, referral to Dr. Berenice Primas. She does have tramadol for breakthrough pain. The knee was strapped with a compressive dressing today.

## 2018-06-04 ENCOUNTER — Other Ambulatory Visit: Payer: Self-pay | Admitting: Family Medicine

## 2018-06-04 DIAGNOSIS — I10 Essential (primary) hypertension: Secondary | ICD-10-CM

## 2018-06-04 NOTE — Telephone Encounter (Signed)
Request 90 day supply.  Danley Danker, RN [facility] RN)

## 2018-06-04 NOTE — Telephone Encounter (Signed)
Refill given, have patient stop by lab to check her potassium levels. Order placed.

## 2018-06-05 ENCOUNTER — Other Ambulatory Visit: Payer: Self-pay | Admitting: Cardiology

## 2018-06-07 ENCOUNTER — Other Ambulatory Visit: Payer: Medicare HMO

## 2018-06-07 DIAGNOSIS — I10 Essential (primary) hypertension: Secondary | ICD-10-CM

## 2018-06-07 NOTE — Telephone Encounter (Signed)
Patient informed and will come by this morning to have her labs checked.  Jazmin Hartsell,CMA

## 2018-06-08 ENCOUNTER — Telehealth: Payer: Self-pay

## 2018-06-08 LAB — BASIC METABOLIC PANEL
BUN/Creatinine Ratio: 20 (ref 9–23)
BUN: 23 mg/dL (ref 6–24)
CALCIUM: 9.6 mg/dL (ref 8.7–10.2)
CO2: 27 mmol/L (ref 20–29)
CREATININE: 1.14 mg/dL — AB (ref 0.57–1.00)
Chloride: 99 mmol/L (ref 96–106)
GFR calc Af Amer: 63 mL/min/{1.73_m2} (ref >59)
GFR, EST NON AFRICAN AMERICAN: 54 mL/min/{1.73_m2} — AB (ref >59)
GLUCOSE: 134 mg/dL — AB (ref 65–99)
Potassium: 3.9 mmol/L (ref 3.5–5.2)
SODIUM: 144 mmol/L (ref 134–144)

## 2018-06-08 NOTE — Telephone Encounter (Signed)
Informed pt of stable lab work. Pt had no further questions.

## 2018-06-08 NOTE — Telephone Encounter (Signed)
-----   Message from [name], DO sent at 06/08/2018  1:38 PM EDT ----- Please inform patient of normal/stable results.

## 2018-06-10 DIAGNOSIS — S83241A Other tear of medial meniscus, current injury, right knee, initial encounter: Secondary | ICD-10-CM | POA: Diagnosis not present

## 2018-06-10 DIAGNOSIS — S83242A Other tear of medial meniscus, current injury, left knee, initial encounter: Secondary | ICD-10-CM | POA: Diagnosis not present

## 2018-07-07 ENCOUNTER — Ambulatory Visit (INDEPENDENT_AMBULATORY_CARE_PROVIDER_SITE_OTHER): Payer: Medicare HMO | Admitting: Sports Medicine

## 2018-07-07 DIAGNOSIS — M170 Bilateral primary osteoarthritis of knee: Secondary | ICD-10-CM | POA: Diagnosis not present

## 2018-07-07 DIAGNOSIS — I10 Essential (primary) hypertension: Secondary | ICD-10-CM | POA: Diagnosis not present

## 2018-07-07 NOTE — Assessment & Plan Note (Signed)
Pressure persistently elevated over the past several visits. I would suggest switching from lisinopril to lisinopril/HCTZ 20/25 mg combo.

## 2018-07-07 NOTE — Progress Notes (Signed)
Subjective:    CC: Right knee pain  HPI: This is a pleasant 57 year old female, known bilateral knee osteoarthritis, she does need bilateral knee arthroplasty, unfortunately she cannot do it until her BMI is less than 40, she is over 52 now.  Pain is moderate, persistent, localized to the medial joint line of the right knee without radiation.  Occasional mechanical symptoms.  Morbid obesity: Needs to lose over 100 pounds, has not yet thought about bariatric surgery.  Hypertension: No headaches, visual changes, chest pain, she will follow-up with her PCP for this.  I reviewed the past medical history, family history, social history, surgical history, and allergies today and no changes were needed.  Please see the problem list section below in epic for further details.  Past Medical History: Past Medical History:  Diagnosis Date  . Arthritis    "knees" (02/21/2014), back  . CHF (congestive heart failure) ([place])   . Chronic bronchitis ([place])    "get it q yr" (02/21/2014)  . Chronic diastolic CHF (congestive heart failure) ([place])    a. Echo 05/2014: EF 65-70% with Grade 1 DD.  Marland Kitchen Chronic lower back pain   . Complication of anesthesia    "I don't come out well; I chew on my tongue"  . COPD (chronic obstructive pulmonary disease) ([place])   . Diabetes mellitus without complication ([place])    TYPE 2  . Family history of adverse reaction to anesthesia    BROTHER PONV, had a blood clot several days later and heart stopped  . Fatty liver    NONALCOLIC  . Fibroid   . Heart murmur   . Hypertension   . Infection of right eye    current dx on Saturday 7/15 - using drops  . Leg swelling    bilateral  . Migraine 1982-2009  . MVP (mitral valve prolapse)   . Neuromuscular disorder ([place])    right leg nerve pain  . Shortness of breath    WITH EXERTION  . Sinus pause    a. noted on telemetry during admission from 10/2016, lasting up to 4.4 seconds. BB discontinued.   . Sleep apnea 02/2016   CPAP 16  TO 20  . Stroke [facility]) noted on CAT 02/2014   "light", LLE weakness remains (03/30/2014)  . 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 Tingling in extremities 12/12/2010   Uric acid and electrolytes (02/23) normal but WBC elevated.   D/Dx: carpal tunnel, ulnar neuropathy Less likely: cervical radiculopathy, vasculitis    Past Surgical History: Past Surgical History:  Procedure Laterality Date  . [place]  . CHOLECYSTECTOMY N/A 06/17/2017   Procedure: LAPAROSCOPIC CHOLECYSTECTOMY WITH INTRAOPERATIVE CHOLANGIOGRAM;  Surgeon: Donnie Mesa, MD;  Location: [place] [place];  Service: General;  Laterality: N/A;  . COLONOSCOPY WITH PROPOFOL N/A 04/28/2017   Procedure: COLONOSCOPY WITH PROPOFOL;  Surgeon: Manus Gunning, MD;  Location: [facility];  Service: Gastroenterology;  Laterality: N/A;  . DILATATION & CURETTAGE/HYSTEROSCOPY WITH MYOSURE N/A 05/06/2016   Procedure: DILATATION & CURETTAGE/HYSTEROSCOPY WITH MYOSURE;  Surgeon: Terrance Mass, MD;  Location: [facility];  Service: Gynecology;  Laterality: N/A;  request to follow around 8:45  requests one hour OR time  . DILATATION & CURETTAGE/HYSTEROSCOPY WITH MYOSURE N/A 04/03/2017   Procedure: DILATATION & CURETTAGE/HYSTEROSCOPY WITH MYOSURE;  Surgeon: Terrance Mass, MD;  Location: [facility];  Service: Gynecology;  Laterality: N/A;  . DILATION AND CURETTAGE OF UTERUS    .  ESOPHAGOGASTRODUODENOSCOPY (EGD) WITH PROPOFOL N/A 04/28/2017   Procedure: ESOPHAGOGASTRODUODENOSCOPY (EGD) WITH PROPOFOL;  Surgeon: Manus Gunning, MD;  Location: [facility];  Service: Gastroenterology;  Laterality: N/A;  . FOOT SURGERY Bilateral    "took bones out; put pins in"  . LEFT AND RIGHT HEART CATHETERIZATION WITH CORONARY ANGIOGRAM N/A 03/31/2014   Procedure: LEFT AND RIGHT HEART CATHETERIZATION WITH CORONARY ANGIOGRAM;  Surgeon: Birdie Riddle, MD;  Location: [facility];  Service:  Cardiovascular;  Laterality: N/A;  . MYOMECTOMY  YRS AGO  . sonohystogram  04/14/2016   [facility]: intramural fibroid, premenopausal endometrium, right fluid filled tubular structure   Social History: Social History   Socioeconomic History  . Marital status: Divorced    Spouse name: Not on file  . Number of children: 1  . Years of education: Not on file  . Highest education level: Not on file  Occupational History  . Not on file  Social Needs  . Financial resource strain: Not on file  . Food insecurity:    Worry: Not on file    Inability: Not on file  . Transportation needs:    Medical: Not on file    Non-medical: Not on file  Tobacco Use  . Smoking status: Current Every Day Smoker    Packs/day: 1.00    Years: 40.00    Pack years: 40.00    Types: Cigarettes  . Smokeless tobacco: Never Used  Substance and Sexual Activity  . Alcohol use: No    Alcohol/week: 0.0 standard drinks  . Drug use: No    Types: "Crack" cocaine    Comment: 03/30/2014 "stopped using crack 02/21/2014"  . Sexual activity: Yes    Birth control/protection: Post-menopausal  Lifestyle  . Physical activity:    Days per week: Not on file    Minutes per session: Not on file  . Stress: Not on file  Relationships  . Social connections:    Talks on phone: Not on file    Gets together: Not on file    Attends religious service: Not on file    Active member of club or organization: Not on file    Attends meetings of clubs or organizations: Not on file    Relationship status: Not on file  Other Topics Concern  . Not on file  Social History Narrative   Married 11/16/2009 to [place] whom she met at Rehab.    Enjoys church at [employer], [place] geared towards people with substance abuse history.       Financial assistance approved for 100% discount at [facility] and has [facility] card Bonna Gains   June 27, 2010 11:36 am   Family History: Family History  Problem Relation Age of  Onset  . Hypertension Mother   . COPD Mother   . Hypertension Father   . Cancer Sister        UTERINE???  . COPD Sister   . Hypertension Sister   . Hypertension Sister    Allergies: No Known Allergies Medications: See med rec.  Review of Systems: No fevers, chills, night sweats, weight loss, chest pain, or shortness of breath.   Objective:    General: Well Developed, well nourished, and in no acute distress.  Neuro: Alert and oriented x3, extra-ocular muscles intact, sensation grossly intact.  HEENT: Normocephalic, atraumatic, pupils equal round reactive to light, neck supple, no masses, no lymphadenopathy, thyroid nonpalpable.  Skin: Warm and dry, no rashes. Cardiac: Regular rate and rhythm, no murmurs  rubs or gallops, no lower extremity edema.  Respiratory: Clear to auscultation bilaterally. Not using accessory muscles, speaking in full sentences. Right knee: Visibly swollen with a palpable fluid wave, effusion, tenderness at the medial joint line ROM normal in flexion and extension and lower leg rotation. Ligaments with solid consistent endpoints including ACL, PCL, LCL, MCL. Negative Mcmurray's and provocative meniscal tests. Non painful patellar compression. Patellar and quadriceps tendons unremarkable. Hamstring and quadriceps strength is normal.  Procedure: Real-time Ultrasound Guided aspiration/injection of right knee Device: GE Logiq E  Verbal informed consent obtained.  Time-out conducted.  Noted no overlying erythema, induration, or other signs of local infection.  Skin prepped in a sterile fashion.  Local anesthesia: Topical Ethyl chloride.  With sterile technique and under real time ultrasound guidance: Using an 18-gauge needle aspirated 6 mL of clear, straw-colored fluid, syringe switched and 1 cc kenalog 40, 2 cc lidocaine, 2 cc bupivacaine injected easily Completed without difficulty  Pain immediately resolved suggesting accurate placement of the medication.    Advised to call if fevers/chills, erythema, induration, drainage, or persistent bleeding.  Images permanently stored and available for review in the ultrasound unit.  Impression: Technically successful ultrasound guided injection.  Impression and Recommendations:    Primary osteoarthritis of both knees Knees bilateral knee replacements but BMI needs to be less than 40, she needs to lose over 100 pounds. Right knee injection as above, previous injection was 3 months ago. I am good to get her set up with bariatric surgery.  Morbid obesity ([place]) Morbid obesity with multiple medical comorbidities. Referral to bariatric surgery to discuss a sleeve gastrectomy.  Hypertension Pressure persistently elevated over the past several visits. I would suggest switching from lisinopril to lisinopril/HCTZ 20/25 mg combo.  ___________________________________________ Gwen Her. Dianah Field, M.D., ABFM., CAQSM. Primary Care and [place] Instructor of [place] of [facility]

## 2018-07-07 NOTE — Assessment & Plan Note (Signed)
Knees bilateral knee replacements but BMI needs to be less than 40, she needs to lose over 100 pounds. Right knee injection as above, previous injection was 3 months ago. I am good to get her set up with bariatric surgery.

## 2018-07-07 NOTE — Assessment & Plan Note (Signed)
Morbid obesity with multiple medical comorbidities. Referral to bariatric surgery to discuss a sleeve gastrectomy.

## 2018-07-14 ENCOUNTER — Telehealth: Payer: Self-pay | Admitting: Cardiology

## 2018-07-14 NOTE — Telephone Encounter (Signed)
New Message          Cano Martin Pena with [facility]. Is needing a call back concerning this patient.

## 2018-07-15 ENCOUNTER — Ambulatory Visit: Payer: Medicare HMO | Admitting: Sports Medicine

## 2018-07-15 NOTE — Telephone Encounter (Signed)
I s/w Elmyra Ricks at [facility] in regards to pt being cleared for her surgery. Pt has not been seen since 02/2017. Pt has appt to see Dr. Marlou Porch 07/26/18. Elmyra Ricks states she sent clearance information directly to Dr. Marlou Porch in basket/email/staff message? I asked for her to please fax clearance information to [phone] so that our Pre Op Team can input the information into the computer. Elmyra Ricks thanked me for the call and the update. I will removed this from the pre op call back pool .

## 2018-07-15 NOTE — Telephone Encounter (Signed)
   Primary Cardiologist:Mark Marlou Porch, MD  Chart reviewed as part of pre-operative protocol coverage. Because of RHENA GLACE past medical history and time since last visit, he/she will require a follow-up visit in order to better assess preoperative cardiovascular risk.  Pre-op covering staff: - Please schedule appointment and call patient to inform them. - Please contact requesting surgeon's office via preferred method (i.e, phone, fax) to inform them of need for appointment prior to surgery.  [name], NP  07/15/2018, 2:13 PM

## 2018-07-23 ENCOUNTER — Ambulatory Visit: Payer: Medicare HMO | Admitting: Cardiology

## 2018-07-26 ENCOUNTER — Ambulatory Visit: Payer: Medicare HMO | Admitting: Cardiology

## 2018-07-26 ENCOUNTER — Encounter: Payer: Self-pay | Admitting: Cardiology

## 2018-07-26 VITALS — BP 134/98 | HR 77 | Ht 62.5 in | Wt 285.6 lb

## 2018-07-26 DIAGNOSIS — Z01810 Encounter for preprocedural cardiovascular examination: Secondary | ICD-10-CM

## 2018-07-26 DIAGNOSIS — I5032 Chronic diastolic (congestive) heart failure: Secondary | ICD-10-CM | POA: Diagnosis not present

## 2018-07-26 DIAGNOSIS — I110 Hypertensive heart disease with heart failure: Secondary | ICD-10-CM | POA: Diagnosis not present

## 2018-07-26 DIAGNOSIS — Z8673 Personal history of transient ischemic attack (TIA), and cerebral infarction without residual deficits: Secondary | ICD-10-CM | POA: Diagnosis not present

## 2018-07-26 MED ORDER — PINDOLOL 5 MG PO TABS: 5.0000 mg | ORAL_TABLET | Freq: Two times a day (BID) | ORAL | 3 refills | Status: DC

## 2018-07-26 NOTE — Patient Instructions (Signed)
Medication Instructions:  The current medical regimen is effective;  continue present plan and medications.  If you need a refill on your cardiac medications before your next appointment, please call your pharmacy.   Follow-Up: At [facility], you and your health needs are our priority.  As part of our continuing mission to provide you with exceptional heart care, we have created designated Provider Care Teams.  These Care Teams include your primary Cardiologist (physician) and Advanced Practice Providers (APPs -  Physician Assistants and Nurse Practitioners) who all work together to provide you with the care you need, when you need it. . You will need a follow up appointment in 6 months.  Please call our office 2 months in advance to schedule this appointment.  You may see Truitt Merle, NP or [name], NP.  Marland Kitchen Follow up with Dr Marlou Porch in 1 year.  You have been given clearance to proceed with you upcoming knee procedure.  Thank you for choosing [facility]!!

## 2018-07-26 NOTE — Progress Notes (Addendum)
Cardiology Office Note:    Date:  07/26/2018   ID:  Hailey Barnes, DOB October 30, 1960, MRN [id number]  PCP:  [name], DO  Cardiologist:  Candee Furbish, MD  Electrophysiologist:  None   Referring MD: [name], DO     History of Present Illness:    Hailey Barnes is a 57 y.o. female here for preoperative cardiovascular stratification prior to orthopedic surgery, knee.  I last visit with her was on 03/17/2017 where we were reviewing her hospitalization in January 2018 for chest discomfort dyspnea chronic diastolic heart failure with normal ejection fraction 70% hypertension COPD morbid obesity type 2 diabetes history of stroke and tobacco use.  At that time, she was going to undergo a fibroid removal.  Her weight in the hospital in January 2018 was 319 pounds.  Chest pain was very atypical, musculoskeletal improved with massaging her chest.  She did have sinus bradycardia at times especially at night with a pause of 4.4 seconds and her beta-blocker carvedilol 25 mg twice a day was discontinued at that time.  Heart rate improved.  Continues to take torsemide.  This helps her with her edema.  She ended up doing quite well for her fibroid removal surgery and she also had a cholecystectomy as well and did well under anesthesia.  She still is having some cramping however postop.  Now contemplating gastric sleeve.  Wants to lose weight in order to either qualify for knee replacement or to rid herself of knee pain.  Past Medical History:  Diagnosis Date  . Arthritis    "knees" (02/21/2014), back  . CHF (congestive heart failure) ([place])   . Chronic bronchitis ([place])    "get it q yr" (02/21/2014)  . Chronic diastolic CHF (congestive heart failure) ([place])    a. Echo 05/2014: EF 65-70% with Grade 1 DD.  Marland Kitchen Chronic lower back pain   . Complication of anesthesia    "I don't come out well; I chew on my tongue"  . COPD (chronic obstructive pulmonary disease) ([place])   . Diabetes mellitus  without complication ([place])    TYPE 2  . Family history of adverse reaction to anesthesia    BROTHER PONV, had a blood clot several days later and heart stopped  . Fatty liver    NONALCOLIC  . Fibroid   . Heart murmur   . Hypertension   . Infection of right eye    current dx on Saturday 7/15 - using drops  . Leg swelling    bilateral  . Migraine 1982-2009  . MVP (mitral valve prolapse)   . Neuromuscular disorder ([place])    right leg nerve pain  . Shortness of breath    WITH EXERTION  . Sinus pause    a. noted on telemetry during admission from 10/2016, lasting up to 4.4 seconds. BB discontinued.   . Sleep apnea 02/2016   CPAP 16 TO 20  . Stroke [facility]) noted on CAT 02/2014   "light", LLE weakness remains (03/30/2014)  . 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 Tingling in extremities 12/12/2010   Uric acid and electrolytes (02/23) normal but WBC elevated.   D/Dx: carpal tunnel, ulnar neuropathy Less likely: cervical radiculopathy, vasculitis     Past Surgical History:  Procedure Laterality Date  . [place]  . CHOLECYSTECTOMY N/A 06/17/2017   Procedure: LAPAROSCOPIC CHOLECYSTECTOMY WITH INTRAOPERATIVE CHOLANGIOGRAM;  Surgeon: Donnie Mesa, MD;  Location: [facility] OR;  Service: General;  Laterality: N/A;  . COLONOSCOPY WITH PROPOFOL N/A 04/28/2017   Procedure: COLONOSCOPY WITH PROPOFOL;  Surgeon: Manus Gunning, MD;  Location: [facility];  Service: Gastroenterology;  Laterality: N/A;  . DILATATION & CURETTAGE/HYSTEROSCOPY WITH MYOSURE N/A 05/06/2016   Procedure: DILATATION & CURETTAGE/HYSTEROSCOPY WITH MYOSURE;  Surgeon: Terrance Mass, MD;  Location: [facility];  Service: Gynecology;  Laterality: N/A;  request to follow around 8:45  requests one hour OR time  . DILATATION & CURETTAGE/HYSTEROSCOPY WITH MYOSURE N/A 04/03/2017   Procedure: DILATATION & CURETTAGE/HYSTEROSCOPY WITH MYOSURE;  Surgeon: Terrance Mass, MD;  Location: [facility];  Service: Gynecology;  Laterality: N/A;  . DILATION AND CURETTAGE OF UTERUS    . ESOPHAGOGASTRODUODENOSCOPY (EGD) WITH PROPOFOL N/A 04/28/2017   Procedure: ESOPHAGOGASTRODUODENOSCOPY (EGD) WITH PROPOFOL;  Surgeon: Manus Gunning, MD;  Location: [facility];  Service: Gastroenterology;  Laterality: N/A;  . FOOT SURGERY Bilateral    "took bones out; put pins in"  . LEFT AND RIGHT HEART CATHETERIZATION WITH CORONARY ANGIOGRAM N/A 03/31/2014   Procedure: LEFT AND RIGHT HEART CATHETERIZATION WITH CORONARY ANGIOGRAM;  Surgeon: Birdie Riddle, MD;  Location: [facility];  Service: Cardiovascular;  Laterality: N/A;  . MYOMECTOMY  YRS AGO  . sonohystogram  04/14/2016   [facility]: intramural fibroid, premenopausal endometrium, right fluid filled tubular structure    Current Medications: Current Meds  Medication Sig  . acetaminophen (TYLENOL) 500 MG tablet Take 500 mg by mouth every 6 (six) hours as needed.  Marland Kitchen albuterol (PROVENTIL HFA;VENTOLIN HFA) 108 (90 Base) MCG/ACT inhaler Inhale 2 puffs into the lungs every 6 (six) hours as needed for wheezing or shortness of breath.   Marland Kitchen aspirin 81 MG tablet Take 81 mg by mouth daily as needed (for chest pain).   Marland Kitchen atorvastatin (LIPITOR) 40 MG tablet Take 40 mg by mouth once a day  . hydrOXYzine (ATARAX/VISTARIL) 50 MG tablet Take 50 mg by mouth at bedtime as needed (sleep).  Marland Kitchen ibuprofen (ADVIL,MOTRIN) 600 MG tablet Take 1 tablet (600 mg total) by mouth every 8 (eight) hours as needed.  . metFORMIN (GLUCOPHAGE) 1000 MG tablet TAKE ONE TABLET BY MOUTH TWICE DAILY WITH  A  MEAL  . mometasone-formoterol (DULERA) 200-5 MCG/ACT AERO Inhale 2 puffs into the lungs 2 (two) times daily as needed for wheezing or shortness of breath.   . pindolol (VISKEN) 5 MG tablet Take 1 tablet (5 mg total) by mouth 2 (two) times daily. Please call and schedule an appt for further refills.2nd attempt  . Potassium Chloride ER 20 MEQ  TBCR TAKE 1 TABLET BY MOUTH EVERY DAY  . tiotropium (SPIRIVA) 18 MCG inhalation capsule Place 18 mcg into inhaler and inhale daily as needed (for shortness of breath).  . torsemide (DEMADEX) 20 MG tablet Take 3 tablets by mouth daily  . traMADol (ULTRAM) 50 MG tablet Take 1 tablet (50 mg total) by mouth every 8 (eight) hours as needed for moderate pain. Maximum 6 tabs per day.  . triamcinolone cream (KENALOG) 0.1 % APPLY ONE APPLICATION TOPICALLY THREE TIMES DAILY AS NEEDED (ECZEMA ON ARMS)     Allergies:   Patient has no known allergies.   Social History   Socioeconomic History  . Marital status: Divorced    Spouse name: Not on file  . Number of children: 1  . Years of education: Not on file  . Highest education level: Not on file  Occupational History  . Not on  file  Social Needs  . Financial resource strain: Not on file  . Food insecurity:    Worry: Not on file    Inability: Not on file  . Transportation needs:    Medical: Not on file    Non-medical: Not on file  Tobacco Use  . Smoking status: Current Every Day Smoker    Packs/day: 1.00    Years: 40.00    Pack years: 40.00    Types: Cigarettes  . Smokeless tobacco: Never Used  Substance and Sexual Activity  . Alcohol use: No    Alcohol/week: 0.0 standard drinks  . Drug use: No    Types: "Crack" cocaine    Comment: 03/30/2014 "stopped using crack 02/21/2014"  . Sexual activity: Yes    Birth control/protection: Post-menopausal  Lifestyle  . Physical activity:    Days per week: Not on file    Minutes per session: Not on file  . Stress: Not on file  Relationships  . Social connections:    Talks on phone: Not on file    Gets together: Not on file    Attends religious service: Not on file    Active member of club or organization: Not on file    Attends meetings of clubs or organizations: Not on file    Relationship status: Not on file  Other Topics Concern  . Not on file  Social History Narrative   Married  11/16/2009 to [place] whom she met at Rehab.    Enjoys church at [employer], [place] geared towards people with substance abuse history.       Financial assistance approved for 100% discount at [facility] and has [facility] card Bonna Gains   June 27, 2010 11:36 am     Family History: The patient's family history includes COPD in her mother and sister; Cancer in her sister; Hypertension in her father, mother, sister, and sister.  ROS:   Please see the history of present illness.     All other systems reviewed and are negative.  EKGs/Labs/Other Studies Reviewed:    The following studies were reviewed today: Echocardiogram 10/23/2016: Severe LVH EF 29% diastolic dysfunction  VQ scan 12/02/2016-low probability  EKG:  EKG is  ordered today.  The ekg ordered today demonstrates sinus rhythm 74 with poor R wave progression otherwise unremarkable.  No significant change from prior personally reviewed and interpreted.  Recent Labs: 02/18/2018: Magnesium 1.9 03/10/2018: ALT 27 03/19/2018: Hemoglobin 16.0; Platelets 232 06/07/2018: BUN 23; Creatinine, Ser 1.14; Potassium 3.9; Sodium 144  Recent Lipid Panel    Component Value Date/Time   CHOL 120 07/02/2017 0652   TRIG 117 07/02/2017 0652   HDL 29 (L) 07/02/2017 0652   CHOLHDL 4.1 07/02/2017 0652   VLDL 23 07/02/2017 0652   LDLCALC 68 07/02/2017 0652    Physical Exam:    VS:  BP (!) 134/98   Pulse 77   Ht 5' 2.5" (1.588 m)   Wt 285 lb 9.6 oz (129.5 kg)   LMP 04/06/2017   SpO2 96%   BMI 51.40 kg/m     Wt Readings from Last 3 Encounters:  07/26/18 285 lb 9.6 oz (129.5 kg)  07/07/18 287 lb (130.2 kg)  06/02/18 288 lb (130.6 kg)     GEN: obese Well nourished, well developed in no acute distress HEENT: Normal NECK: No JVD; No carotid bruits LYMPHATICS: No lymphadenopathy CARDIAC: RRR, no murmurs, rubs, gallops RESPIRATORY:  Clear to auscultation without rales, wheezing or rhonchi  ABDOMEN: Soft,  non-tender,  non-distended MUSCULOSKELETAL:  No edema; No deformity  SKIN: Warm and dry NEUROLOGIC:  Alert and oriented x 3 PSYCHIATRIC:  Normal affect   ASSESSMENT:    1. Pre-operative cardiovascular examination   2. Morbid obesity, unspecified obesity type ([place])   3. Chronic diastolic CHF (congestive heart failure) ([place])   4. Hypertensive heart failure ([place])   5. History of CVA (cerebrovascular accident)    PLAN:    In order of problems listed above:  Preop cardiovascular evaluation prior to orthopedic surgery, knee arthroscopy for torn meniscus - Prior visit in 2018 for preop visit prior to hysterectomy.  She seems to be able to complete greater than 4 METS of activity without any significant angina.  However, she does have chronic shortness of breath with activity likely as it relates to obesity.  No current signs of acute heart failure. - She may proceed with orthopedic surgery, knee arthroscopy with moderate overall cardiovascular risk based mainly upon weight.  She does understands that there are always potential for complications from a respiratory/cardiovascular respect.  She has done well last year with cholecystectomy and fibroid tumor removal.  Atypical chest pain - Surrounding left breast, shooting sharp discomfort protruding forward.  Fleeting.  No radiation.  By description, sounds more musculoskeletal.  She would not be a good candidate for nuclear imaging given body habitus.  Based upon the atypical nature of her symptoms and unremarkable ECG, I would not recommend cardiac catheterization or angiogram unless significant objective evidence was present.  Risks would outweigh the benefits.  Chronic diastolic heart failure - No change with torsemide.  Fluid restriction 1.5 L daily.  Creatinine has ranged from 1.1-1.3.  Prior history of stroke - Continue with statin, aspirin, blood pressure control.  LDL previously 68.  Excellent.  Morbid obesity - Had been involved with the [employer].   Weight had decreased.  Excellent.  She has been referred to the bariatric clinic.  She seems motivated to consider the gastric sleeve.  She would not be a candidate for knee replacement at this weight.  Hypertensive heart disease with heart failure - Blood pressure being managed very well.  Continue with fluid balance.  Weight loss.  Diabetes with hypertension - Prior hemoglobin A1c 7.2.   continue to work on this  Tobacco use -Continue to encourage cessation.  We will have her come back in in 6 months to see Cecille Rubin or Mickel Baas and 12 months to see me.  Medication Adjustments/Labs and Tests Ordered: Current medicines are reviewed at length with the patient today.  Concerns regarding medicines are outlined above.  Orders Placed This Encounter  Procedures  . EKG 12-Lead   No orders of the defined types were placed in this encounter.   Patient Instructions  Medication Instructions:  The current medical regimen is effective;  continue present plan and medications.  If you need a refill on your cardiac medications before your next appointment, please call your pharmacy.   Follow-Up: At [facility], you and your health needs are our priority.  As part of our continuing mission to provide you with exceptional heart care, we have created designated Provider Care Teams.  These Care Teams include your primary Cardiologist (physician) and Advanced Practice Providers (APPs -  Physician Assistants and Nurse Practitioners) who all work together to provide you with the care you need, when you need it. . You will need a follow up appointment in 6 months.  Please call our office 2 months in advance to schedule  this appointment.  You may see Truitt Merle, NP or [name], NP.  Marland Kitchen Follow up with Dr Marlou Porch in 1 year.  You have been given clearance to proceed with you upcoming knee procedure.  Thank you for choosing [facility]!!         Signed, Candee Furbish, MD  07/26/2018 11:57 AM     [place]

## 2018-07-26 NOTE — Addendum Note (Signed)
Addended by: Shellia Cleverly on: 07/26/2018 11:58 AM   Modules accepted: Orders

## 2018-07-27 ENCOUNTER — Telehealth: Payer: Self-pay

## 2018-07-27 NOTE — Telephone Encounter (Signed)
Pt called nurse line stating she has been having some RUQ pain since she returned from a trip. Pt stated she was constipated for a couple of days but finally some relief this morning, normal stools. Pt stated she has been having this pain associated with some nausea. Pt did say her gallbladder was removed. I scheduled pt for ATC 10/10 and to do the BRAT diet in the meantime and drink plenty fluids.

## 2018-07-29 ENCOUNTER — Ambulatory Visit: Payer: Medicare HMO

## 2018-08-04 ENCOUNTER — Other Ambulatory Visit: Payer: Self-pay | Admitting: Orthopedic Surgery

## 2018-08-10 NOTE — Pre-Procedure Instructions (Signed)
Hailey Barnes  08/10/2018      [id number] [address], [place] [address] Phone: [phone] Fax: [phone]  [id number] [id number]: [phone]: [phone]   Your procedure is scheduled on Monday October 28.  [facility]  Call this number if you have problems the morning of surgery:  [phone]   Remember:  Do not eat or drink after midnight.    Take these medicines the morning of surgery with A SIP OF WATER:   Pindolol (Visken) Albuterol if needed (please bring inhaler to hospital with you) DRuthe MannanSpiriva Tramadol (utlram) if needed  DO NOT TAKE Metformin (Glucophage) the day of surgery  7 days prior to surgery STOP taking any Aspirin(unless otherwise instructed by your surgeon), Aleve, Naproxen, Ibuprofen, Motrin, Advil, Goody's, BC's, all herbal medications, fish oil, and all vitamins     How to Manage Your Diabetes Before and After Surgery  Why is it important to control my blood sugar before and after surgery? . Improving blood sugar levels before and after surgery helps healing and can limit problems. . A way of improving blood sugar control is eating a healthy diet by: o  Eating less sugar and carbohydrates o  Increasing activity/exercise o  Talking with your doctor about reaching your blood sugar goals . High blood sugars (greater than 180 mg/dL) can raise your risk of infections and slow your recovery, so you will need to focus on controlling your diabetes during the weeks before surgery. . Make sure that the doctor who takes care of your diabetes knows about your planned surgery including the date and location.  How do I manage my blood sugar before surgery? . Check your blood sugar at least 4 times a day, starting 2  days before surgery, to make sure that the level is not too high or low. o Check your blood sugar the morning of your surgery when you wake up and every 2 hours until you get to the Short Stay unit. . If your blood sugar is less than 70 mg/dL, you will need to treat for low blood sugar: o Do not take insulin. o Treat a low blood sugar (less than 70 mg/dL) with  cup of clear juice (cranberry or apple), 4 glucose tablets, OR glucose gel. Recheck blood sugar in 15 minutes after treatment (to make sure it is greater than 70 mg/dL). If your blood sugar is not greater than 70 mg/dL on recheck, call [phone]  for further instructions. . Report your blood sugar to the short stay nurse when you get to Short Stay.  . If you are admitted to the hospital after surgery: o Your blood sugar will be checked by the staff and you will probably be given insulin after surgery (instead of oral diabetes medicines) to make sure you have good blood sugar levels. o The goal for blood sugar control after surgery is 80-180 mg/dL.              Do not wear jewelry, make-up or nail polish.  Do not wear lotions, powders, or perfumes, or deodorant.  Do not shave 48 hours prior to surgery.  Men may shave face and neck.  Do not bring valuables to the hospital.  [facility] any  belongings or valuables.  Contacts, dentures or bridgework may not be worn into surgery.  Leave your suitcase in the car.  After surgery it may be brought to your room.  For patients admitted to the hospital, discharge time will be determined by your treatment team.  Patients discharged the day of surgery will not be allowed to drive home.    Special instructions:    [facility]- Preparing For Surgery  Before surgery, you can play an important role. Because skin is not sterile, your skin needs to be as free of germs as possible. You can reduce the number of germs on your skin by washing with CHG  (chlorahexidine gluconate) Soap before surgery.  CHG is an antiseptic cleaner which kills germs and bonds with the skin to continue killing germs even after washing.    Oral Hygiene is also important to reduce your risk of infection.  Remember - BRUSH YOUR TEETH THE MORNING OF SURGERY WITH YOUR REGULAR TOOTHPASTE  Please do not use if you have an allergy to CHG or antibacterial soaps. If your skin becomes reddened/irritated stop using the CHG.  Do not shave (including legs and underarms) for at least 48 hours prior to first CHG shower. It is OK to shave your face.  Please follow these instructions carefully.   1. Shower the NIGHT BEFORE SURGERY and the MORNING OF SURGERY with CHG.   2. If you chose to wash your hair, wash your hair first as usual with your normal shampoo.  3. After you shampoo, rinse your hair and body thoroughly to remove the shampoo.  4. Use CHG as you would any other liquid soap. You can apply CHG directly to the skin and wash gently with a scrungie or a clean washcloth.   5. Apply the CHG Soap to your body ONLY FROM THE NECK DOWN.  Do not use on open wounds or open sores. Avoid contact with your eyes, ears, mouth and genitals (private parts). Wash Face and genitals (private parts)  with your normal soap.  6. Wash thoroughly, paying special attention to the area where your surgery will be performed.  7. Thoroughly rinse your body with warm water from the neck down.  8. DO NOT shower/wash with your normal soap after using and rinsing off the CHG Soap.  9. Pat yourself dry with a CLEAN TOWEL.  10. Wear CLEAN PAJAMAS to bed the night before surgery, wear comfortable clothes the morning of surgery  11. Place CLEAN SHEETS on your bed the night of your first shower and DO NOT SLEEP WITH PETS.    Day of Surgery:  Do not apply any deodorants/lotions.  Please wear clean clothes to the hospital/surgery center.   Remember to brush your teeth WITH YOUR REGULAR  TOOTHPASTE.    Please read over the following fact sheets that you were given. Coughing and Deep Breathing, MRSA Information and Surgical Site Infection Prevention

## 2018-08-12 ENCOUNTER — Other Ambulatory Visit: Payer: Self-pay

## 2018-08-12 ENCOUNTER — Encounter (HOSPITAL_COMMUNITY): Payer: Self-pay

## 2018-08-12 ENCOUNTER — Encounter (HOSPITAL_COMMUNITY)
Admission: RE | Admit: 2018-08-12 | Discharge: 2018-08-12 | Disposition: A | Discharge status: Home / Self Care | Payer: Medicare HMO | Source: Ambulatory Visit | Attending: Orthopedic Surgery | Admitting: Orthopedic Surgery

## 2018-08-12 DIAGNOSIS — Z01812 Encounter for preprocedural laboratory examination: Secondary | ICD-10-CM | POA: Insufficient documentation

## 2018-08-12 LAB — COMPREHENSIVE METABOLIC PANEL
ALBUMIN: 3.3 g/dL — AB (ref 3.5–5.0)
ALT: 18 U/L (ref 0–44)
AST: 16 U/L (ref 15–41)
Alkaline Phosphatase: 89 U/L (ref 38–126)
Anion gap: 7 (ref 5–15)
BUN: 9 mg/dL (ref 6–20)
CHLORIDE: 104 mmol/L (ref 98–111)
CO2: 28 mmol/L (ref 22–32)
CREATININE: 0.96 mg/dL (ref 0.44–1.00)
Calcium: 8.9 mg/dL (ref 8.9–10.3)
GFR calc Af Amer: >60 mL/min (ref >60)
GLUCOSE: 168 mg/dL — AB (ref 70–99)
POTASSIUM: 3.3 mmol/L — AB (ref 3.5–5.1)
SODIUM: 139 mmol/L (ref 135–145)
Total Bilirubin: 0.5 mg/dL (ref 0.3–1.2)
Total Protein: 6.1 g/dL — ABNORMAL LOW (ref 6.5–8.1)

## 2018-08-12 LAB — CBC
HEMATOCRIT: 48.6 % — AB (ref 36.0–46.0)
HEMOGLOBIN: 15.3 g/dL — AB (ref 12.0–15.0)
MCH: 26.9 pg (ref 26.0–34.0)
MCHC: 31.5 g/dL (ref 30.0–36.0)
MCV: 85.6 fL (ref 80.0–100.0)
Platelets: 238 10*3/uL (ref 150–400)
RBC: 5.68 MIL/uL — ABNORMAL HIGH (ref 3.87–5.11)
RDW: 13.8 % (ref 11.5–15.5)
WBC: 11.7 10*3/uL — AB (ref 4.0–10.5)
nRBC: 0 % (ref 0.0–0.2)

## 2018-08-12 LAB — HEMOGLOBIN A1C
HEMOGLOBIN A1C: 7.4 % — AB (ref 4.8–5.6)
MEAN PLASMA GLUCOSE: 165.68 mg/dL

## 2018-08-12 LAB — GLUCOSE, CAPILLARY: Glucose-Capillary: 179 mg/dL — ABNORMAL HIGH (ref 70–99)

## 2018-08-12 NOTE — Progress Notes (Signed)
PCP - Dr. Dianah Field  Cardiologist - Dr. Marlou Porch- [place] 07/26/18  Chest x-ray - Denies  EKG - 07/26/18 (E)  Stress Test - 10/23/16 (E)  ECHO - 10/23/16 (E)  Cardiac Cath - 03/31/14 (E)  AICD- n/a PM- n/a LOOP- n/a  Sleep Study - Yes- Positive CPAP - Yes  LABS- 08/12/18: CBC, CMP  ASA- Denies  HA1C- 08/12/18 Fasting Blood Sugar - Today, 179 Checks Blood Sugar __0___ times a day- Pt sts she does not know how to use her BS meter. Pt told to bring the equipment on DOS so we can help her with.  Pt sts she took her bp and heart medicine prior to arrival. Pt made aware that her surgery could be cancelled due to abnormal readings.  Anesthesia- yes- cardiac history  Pt denies having chest pain, sob, or fever at this time. All instructions explained to the pt, with a verbal understanding of the material. Pt agrees to go over the instructions while at home for a better understanding. The opportunity to ask questions was provided.

## 2018-08-13 ENCOUNTER — Encounter (HOSPITAL_COMMUNITY): Payer: Self-pay

## 2018-08-13 MED ORDER — DEXTROSE 5 % IV SOLN
3.0000 g | INTRAVENOUS | Status: AC
  Administered 2018-08-16: 3 g via INTRAVENOUS
  Filled 2018-08-13: qty 3

## 2018-08-13 NOTE — Anesthesia Preprocedure Evaluation (Addendum)
Anesthesia Evaluation  Patient identified by MRN, date of birth, ID band Patient awake    Reviewed: Allergy & Precautions, H&P , NPO status , Patient's Chart, lab work & pertinent test results  Airway Mallampati: II  TM Distance: >3 FB Neck ROM: Full    Dental no notable dental hx. (+) Teeth Intact, Dental Advisory Given   Pulmonary sleep apnea and Continuous Positive Airway Pressure Ventilation , COPD,  COPD inhaler, Current Smoker,    Pulmonary exam normal breath sounds clear to auscultation       Cardiovascular Exercise Tolerance: Good hypertension, Pt. on medications and Pt. on home beta blockers +CHF  + Valvular Problems/Murmurs MVP  Rhythm:Regular Rate:Normal     Neuro/Psych  Headaches, Anxiety Depression CVA, Residual Symptoms    GI/Hepatic negative GI ROS, Neg liver ROS,   Endo/Other  diabetes, Type 2, Oral Hypoglycemic AgentsMorbid obesity  Renal/GU negative Renal ROS  negative genitourinary   Musculoskeletal  (+) Arthritis , Osteoarthritis,    Abdominal   Peds  Hematology negative hematology ROS (+)   Anesthesia Other Findings   Reproductive/Obstetrics negative OB ROS                           Anesthesia Physical Anesthesia Plan  ASA: III  Anesthesia Plan: General   Post-op Pain Management:    Induction: Intravenous  PONV Risk Score and Plan: 3 and Ondansetron, Midazolam and Treatment may vary due to age or medical condition  Airway Management Planned: Oral ETT and LMA  Additional Equipment:   Intra-op Plan:   Post-operative Plan: Extubation in OR  Informed Consent: I have reviewed the patients History and Physical, chart, labs and discussed the procedure including the risks, benefits and alternatives for the proposed anesthesia with the patient or authorized representative who has indicated his/her understanding and acceptance.   Dental advisory given  Plan  Discussed with: CRNA  Anesthesia Plan Comments: (Cardiac clearance moderate risk by Dr. Marlou Porch 07/26/2018. Significantly elevated BP at PAT: 172/115; 182/111; 177/111. Recent BP at Dr. Kingsley Plan office 134/98 and he remarked her control has been good. Pt made aware that systolic >435 and/or diastolic >686 could be cause for cancellation. Dr. Berenice Primas and Dr. Marlou Porch made aware.  )   Anesthesia Quick Evaluation

## 2018-08-15 NOTE — H&P (Addendum)
A pre op hand p   Chief Complaint: l knee pain  HPI: Hailey Barnes is a 57 y.o. female who presents for evaluation of l knee pain. It has been present for greatre than 3 months and has been worsening. She has failed conservative measures. Pain is rated as moderate.  Past Medical History:  Diagnosis Date  . Arthritis    "knees" (02/21/2014), back  . CHF (congestive heart failure) ([place])   . Chronic bronchitis ([place])    "get it q yr" (02/21/2014)  . Chronic diastolic CHF (congestive heart failure) ([place])    a. Echo 10/23/2016: EF 75 %  . Chronic lower back pain   . Complication of anesthesia    "I don't come out well; I chew on my tongue"  . COPD (chronic obstructive pulmonary disease) ([place])   . Diabetes mellitus without complication ([place])    TYPE 2  . Family history of adverse reaction to anesthesia    BROTHER PONV, had a blood clot several days later and heart stopped  . Fatty liver    NONALCOLIC  . Fibroid   . Heart murmur   . Hypertension   . Infection of right eye    current dx on Saturday 7/15 - using drops  . Leg swelling    bilateral  . Migraine 1982-2009  . MVP (mitral valve prolapse)   . Neuromuscular disorder ([place])    right leg nerve pain  . Shortness of breath    WITH EXERTION  . Sinus pause    a. noted on telemetry during admission from 10/2016, lasting up to 4.4 seconds. BB discontinued.   . Sleep apnea 02/2016   CPAP 16 TO 20  . Stroke [facility]) noted on CAT 02/2014   "light", LLE weakness remains (03/30/2014)  . 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 Tingling in extremities 12/12/2010   Uric acid and electrolytes (02/23) normal but WBC elevated.   D/Dx: carpal tunnel, ulnar neuropathy Less likely: cervical radiculopathy, vasculitis    Past Surgical History:  Procedure Laterality Date  . [place]  . CHOLECYSTECTOMY N/A 06/17/2017   Procedure: LAPAROSCOPIC CHOLECYSTECTOMY WITH INTRAOPERATIVE  CHOLANGIOGRAM;  Surgeon: Donnie Mesa, MD;  Location: [facility];  Service: General;  Laterality: N/A;  . COLONOSCOPY WITH PROPOFOL N/A 04/28/2017   Procedure: COLONOSCOPY WITH PROPOFOL;  Surgeon: Manus Gunning, MD;  Location: [facility];  Service: Gastroenterology;  Laterality: N/A;  . DILATATION & CURETTAGE/HYSTEROSCOPY WITH MYOSURE N/A 05/06/2016   Procedure: DILATATION & CURETTAGE/HYSTEROSCOPY WITH MYOSURE;  Surgeon: Terrance Mass, MD;  Location: [facility];  Service: Gynecology;  Laterality: N/A;  request to follow around 8:45  requests one hour OR time  . DILATATION & CURETTAGE/HYSTEROSCOPY WITH MYOSURE N/A 04/03/2017   Procedure: DILATATION & CURETTAGE/HYSTEROSCOPY WITH MYOSURE;  Surgeon: Terrance Mass, MD;  Location: [facility];  Service: Gynecology;  Laterality: N/A;  . DILATION AND CURETTAGE OF UTERUS    . ESOPHAGOGASTRODUODENOSCOPY (EGD) WITH PROPOFOL N/A 04/28/2017   Procedure: ESOPHAGOGASTRODUODENOSCOPY (EGD) WITH PROPOFOL;  Surgeon: Manus Gunning, MD;  Location: [facility];  Service: Gastroenterology;  Laterality: N/A;  . FOOT SURGERY Bilateral    "took bones out; put pins in"  . LEFT AND RIGHT HEART CATHETERIZATION WITH CORONARY ANGIOGRAM N/A 03/31/2014   Procedure: LEFT AND RIGHT HEART CATHETERIZATION WITH CORONARY ANGIOGRAM;  Surgeon: Birdie Riddle, MD;  Location: [facility];  Service: Cardiovascular;  Laterality:  N/A;  . MYOMECTOMY  YRS AGO  . sonohystogram  04/14/2016   [facility]: intramural fibroid, premenopausal endometrium, right fluid filled tubular structure   Social History   Socioeconomic History  . Marital status: Divorced    Spouse name: Not on file  . Number of children: 1  . Years of education: Not on file  . Highest education level: Not on file  Occupational History  . Not on file  Social Needs  . Financial resource strain: Not on file  . Food insecurity:    Worry: Not on file    Inability: Not on file  .  Transportation needs:    Medical: Not on file    Non-medical: Not on file  Tobacco Use  . Smoking status: Current Every Day Smoker    Packs/day: 0.50    Years: 40.00    Pack years: 20.00    Types: Cigarettes  . Smokeless tobacco: Never Used  Substance and Sexual Activity  . Alcohol use: Yes    Comment: occasional  . Drug use: No    Types: "Crack" cocaine    Comment: 03/30/2014 "stopped using crack 02/21/2014"  . Sexual activity: Yes    Birth control/protection: Post-menopausal  Lifestyle  . Physical activity:    Days per week: Not on file    Minutes per session: Not on file  . Stress: Not on file  Relationships  . Social connections:    Talks on phone: Not on file    Gets together: Not on file    Attends religious service: Not on file    Active member of club or organization: Not on file    Attends meetings of clubs or organizations: Not on file    Relationship status: Not on file  Other Topics Concern  . Not on file  Social History Narrative   Married 11/16/2009 to [place] whom she met at Rehab.    Enjoys church at [employer], [place] geared towards people with substance abuse history.       Financial assistance approved for 100% discount at [facility] and has [facility] card Bonna Gains   June 27, 2010 11:36 am   Family History  Problem Relation Age of Onset  . Hypertension Mother   . COPD Mother   . Hypertension Father   . Cancer Sister        UTERINE???  . COPD Sister   . Hypertension Sister   . Hypertension Sister    No Known Allergies Prior to Admission medications   Medication Sig Start Date End Date Taking? Authorizing Provider  albuterol (PROVENTIL HFA;VENTOLIN HFA) 108 (90 Base) MCG/ACT inhaler Inhale 2 puffs into the lungs every 6 (six) hours as needed for wheezing or shortness of breath.    Yes [provider]  atorvastatin (LIPITOR) 40 MG tablet Take 40 mg by mouth once a day Patient taking differently: Take 40 mg by mouth daily.  03/10/18   Yes Smiley Houseman, MD  lisinopril (PRINIVIL,ZESTRIL) 40 MG tablet Take 1 tablet (40 mg total) by mouth daily. 12/08/17 08/06/18 Yes [name], DO  metFORMIN (GLUCOPHAGE) 1000 MG tablet TAKE ONE TABLET BY MOUTH TWICE DAILY WITH  A  MEAL Patient taking differently: Take 1,000 mg by mouth 2 (two) times daily with a meal.  08/19/17  Yes Bonnita Hollow, MD  mometasone-formoterol (DULERA) 200-5 MCG/ACT AERO Inhale 2 puffs into the lungs daily.    Yes [provider]  pindolol (VISKEN) 5 MG tablet Take  1 tablet (5 mg total) by mouth 2 (two) times daily. 07/26/18  Yes [name], MD  Potassium Chloride ER 20 MEQ TBCR TAKE 1 TABLET BY MOUTH EVERY DAY Patient taking differently: Take 20 mEq by mouth daily.  06/04/18  Yes [name], DO  tiotropium (SPIRIVA) 18 MCG inhalation capsule Place 18 mcg into inhaler and inhale daily.    Yes [provider]  torsemide (DEMADEX) 20 MG tablet Take 3 tablets by mouth daily Patient taking differently: Take 60 mg by mouth daily.  02/25/18  Yes Dickie La, MD  traMADol (ULTRAM) 50 MG tablet Take 1 tablet (50 mg total) by mouth every 8 (eight) hours as needed for moderate pain. Maximum 6 tabs per day. Patient taking differently: Take 50-100 mg by mouth every 6 (six) hours as needed for moderate pain. Maximum 6 tabs per day. 05/27/18  Yes Silverio Decamp, MD  triamcinolone cream (KENALOG) 0.1 % APPLY ONE APPLICATION TOPICALLY THREE TIMES DAILY AS NEEDED (ECZEMA ON ARMS) Patient taking differently: Apply 1 application topically 3 (three) times daily as needed (for eczema).  08/19/17  Yes Bonnita Hollow, MD  ibuprofen (ADVIL,MOTRIN) 600 MG tablet Take 1 tablet (600 mg total) by mouth every 8 (eight) hours as needed. Patient not taking: Reported on 08/06/2018 11/16/17   Silverio Decamp, MD     Positive ROS: none  All other systems have been reviewed and were otherwise negative with the exception of those mentioned in the  HPI and as above.  Physical Exam: There were no vitals filed for this visit. Vitals:   08/16/18 0950  BP: (!) 157/106  Pulse: 77  Resp: 20  Temp: 98.7 F (37.1 C)  SpO2: 95%   General: Alert, no acute distress Cardiovascular: No pedal edema Respiratory: No cyanosis, no use of accessory musculature GI: No organomegaly, abdomen is soft and non-tender Skin: No lesions in the area of chief complaint Neurologic: Sensation intact distally Psychiatric: Patient is competent for consent with normal mood and affect Lymphatic: No axillary or cervical lymphadenopathy  MUSCULOSKELETAL: l knee: painful rom limited rom med jt line tender -instability  Assessment/Plan: LEFT KNEE MEDIAL MENISCUS TEAR Plan for Procedure(s): ARTHROSCOPY KNEE  The risks benefits and alternatives were discussed with the patient including but not limited to the risks of nonoperative treatment, versus surgical intervention including infection, bleeding, nerve injury, malunion, nonunion, hardware prominence, hardware failure, need for hardware removal, blood clots, cardiopulmonary complications, morbidity, mortality, among others, and they were willing to proceed.  Predicted outcome is good, although there will be at least a six to nine month expected recovery.  Alta Corning, MD 08/15/2018 8:54 PM

## 2018-08-16 ENCOUNTER — Encounter (HOSPITAL_COMMUNITY): Payer: Self-pay

## 2018-08-16 ENCOUNTER — Encounter (HOSPITAL_COMMUNITY)
Admission: RE | Disposition: A | Discharge status: Home / Self Care | Payer: Self-pay | Source: Ambulatory Visit | Attending: Orthopedic Surgery

## 2018-08-16 ENCOUNTER — Ambulatory Visit (HOSPITAL_COMMUNITY): Payer: Medicare HMO | Admitting: Physician Assistant

## 2018-08-16 ENCOUNTER — Ambulatory Visit (HOSPITAL_COMMUNITY): Payer: Medicare HMO | Admitting: Anesthesiology

## 2018-08-16 ENCOUNTER — Ambulatory Visit (HOSPITAL_COMMUNITY)
Admission: RE | Admit: 2018-08-16 | Discharge: 2018-08-16 | Disposition: A | Discharge status: Home / Self Care | Payer: Medicare HMO | Source: Ambulatory Visit | Attending: Orthopedic Surgery | Admitting: Orthopedic Surgery

## 2018-08-16 ENCOUNTER — Other Ambulatory Visit: Payer: Self-pay

## 2018-08-16 DIAGNOSIS — M6752 Plica syndrome, left knee: Secondary | ICD-10-CM | POA: Diagnosis not present

## 2018-08-16 DIAGNOSIS — G4730 Sleep apnea, unspecified: Secondary | ICD-10-CM | POA: Insufficient documentation

## 2018-08-16 DIAGNOSIS — E119 Type 2 diabetes mellitus without complications: Secondary | ICD-10-CM | POA: Diagnosis not present

## 2018-08-16 DIAGNOSIS — F17210 Nicotine dependence, cigarettes, uncomplicated: Secondary | ICD-10-CM | POA: Diagnosis not present

## 2018-08-16 DIAGNOSIS — Z7951 Long term (current) use of inhaled steroids: Secondary | ICD-10-CM | POA: Diagnosis not present

## 2018-08-16 DIAGNOSIS — I110 Hypertensive heart disease with heart failure: Secondary | ICD-10-CM | POA: Diagnosis not present

## 2018-08-16 DIAGNOSIS — J449 Chronic obstructive pulmonary disease, unspecified: Secondary | ICD-10-CM | POA: Insufficient documentation

## 2018-08-16 DIAGNOSIS — I69354 Hemiplegia and hemiparesis following cerebral infarction affecting left non-dominant side: Secondary | ICD-10-CM | POA: Insufficient documentation

## 2018-08-16 DIAGNOSIS — M25562 Pain in left knee: Secondary | ICD-10-CM | POA: Diagnosis present

## 2018-08-16 DIAGNOSIS — Z7984 Long term (current) use of oral hypoglycemic drugs: Secondary | ICD-10-CM | POA: Diagnosis not present

## 2018-08-16 DIAGNOSIS — X58XXXA Exposure to other specified factors, initial encounter: Secondary | ICD-10-CM | POA: Diagnosis not present

## 2018-08-16 DIAGNOSIS — Z6841 Body Mass Index (BMI) 40.0 and over, adult: Secondary | ICD-10-CM | POA: Diagnosis not present

## 2018-08-16 DIAGNOSIS — S83242A Other tear of medial meniscus, current injury, left knee, initial encounter: Secondary | ICD-10-CM | POA: Insufficient documentation

## 2018-08-16 DIAGNOSIS — Z79899 Other long term (current) drug therapy: Secondary | ICD-10-CM | POA: Insufficient documentation

## 2018-08-16 DIAGNOSIS — I509 Heart failure, unspecified: Secondary | ICD-10-CM | POA: Insufficient documentation

## 2018-08-16 DIAGNOSIS — M94262 Chondromalacia, left knee: Secondary | ICD-10-CM | POA: Diagnosis present

## 2018-08-16 DIAGNOSIS — I5032 Chronic diastolic (congestive) heart failure: Secondary | ICD-10-CM | POA: Diagnosis not present

## 2018-08-16 DIAGNOSIS — M2242 Chondromalacia patellae, left knee: Secondary | ICD-10-CM | POA: Diagnosis not present

## 2018-08-16 DIAGNOSIS — Z9989 Dependence on other enabling machines and devices: Secondary | ICD-10-CM | POA: Diagnosis not present

## 2018-08-16 HISTORY — PX: KNEE ARTHROSCOPY: SHX127

## 2018-08-16 LAB — GLUCOSE, CAPILLARY
Glucose-Capillary: 145 mg/dL — ABNORMAL HIGH (ref 70–99)
Glucose-Capillary: 150 mg/dL — ABNORMAL HIGH (ref 70–99)
Glucose-Capillary: 143 mg/dL — ABNORMAL HIGH (ref 70–99)

## 2018-08-16 SURGERY — ARTHROSCOPY, KNEE
Anesthesia: General | Site: Knee | Laterality: Left

## 2018-08-16 MED ORDER — LACTATED RINGERS IV SOLN
INTRAVENOUS | Status: DC
  Administered 2018-08-16: 11:00:00 via INTRAVENOUS

## 2018-08-16 MED ORDER — ONDANSETRON HCL 4 MG/2ML IJ SOLN
INTRAMUSCULAR | Status: DC | PRN
  Administered 2018-08-16: 4 mg via INTRAVENOUS

## 2018-08-16 MED ORDER — HYDROCODONE-ACETAMINOPHEN 5-325 MG PO TABS: 1.0000 | ORAL_TABLET | Freq: Four times a day (QID) | ORAL | 0 refills | Status: DC | PRN

## 2018-08-16 MED ORDER — ESMOLOL HCL 100 MG/10ML IV SOLN
INTRAVENOUS | Status: DC | PRN
  Administered 2018-08-16: 50 mg via INTRAVENOUS

## 2018-08-16 MED ORDER — MIDAZOLAM HCL 2 MG/2ML IJ SOLN
INTRAMUSCULAR | Status: AC
  Filled 2018-08-16: qty 2

## 2018-08-16 MED ORDER — LIDOCAINE 2% (20 MG/ML) 5 ML SYRINGE
INTRAMUSCULAR | Status: DC | PRN
  Administered 2018-08-16: 100 mg via INTRAVENOUS

## 2018-08-16 MED ORDER — FENTANYL CITRATE (PF) 250 MCG/5ML IJ SOLN
INTRAMUSCULAR | Status: DC | PRN
  Administered 2018-08-16: 100 ug via INTRAVENOUS

## 2018-08-16 MED ORDER — ONDANSETRON HCL 4 MG/2ML IJ SOLN
INTRAMUSCULAR | Status: AC
  Filled 2018-08-16: qty 2

## 2018-08-16 MED ORDER — CHLORHEXIDINE GLUCONATE 4 % EX LIQD: 60.0000 mL | Freq: Once | CUTANEOUS | Status: DC

## 2018-08-16 MED ORDER — ALBUTEROL SULFATE HFA 108 (90 BASE) MCG/ACT IN AERS
INHALATION_SPRAY | RESPIRATORY_TRACT | Status: DC | PRN
  Administered 2018-08-16 (×2): 6 via RESPIRATORY_TRACT

## 2018-08-16 MED ORDER — FENTANYL CITRATE (PF) 100 MCG/2ML IJ SOLN
INTRAMUSCULAR | Status: AC
  Filled 2018-08-16: qty 2

## 2018-08-16 MED ORDER — DEXMEDETOMIDINE HCL 200 MCG/2ML IV SOLN
INTRAVENOUS | Status: DC | PRN
  Administered 2018-08-16: 24 ug via INTRAVENOUS
  Administered 2018-08-16: 16 ug via INTRAVENOUS

## 2018-08-16 MED ORDER — FENTANYL CITRATE (PF) 250 MCG/5ML IJ SOLN
INTRAMUSCULAR | Status: AC
  Filled 2018-08-16: qty 5

## 2018-08-16 MED ORDER — SODIUM CHLORIDE 0.9 % IR SOLN
Status: DC | PRN
  Administered 2018-08-16 (×2): 3000 mL

## 2018-08-16 MED ORDER — PROPOFOL 10 MG/ML IV BOLUS
INTRAVENOUS | Status: DC | PRN
  Administered 2018-08-16: 200 mg via INTRAVENOUS

## 2018-08-16 MED ORDER — DEXAMETHASONE SODIUM PHOSPHATE 10 MG/ML IJ SOLN
INTRAMUSCULAR | Status: DC | PRN
  Administered 2018-08-16: 5 mg via INTRAVENOUS

## 2018-08-16 MED ORDER — MIDAZOLAM HCL 2 MG/2ML IJ SOLN
INTRAMUSCULAR | Status: DC | PRN
  Administered 2018-08-16: 2 mg via INTRAVENOUS

## 2018-08-16 MED ORDER — ROCURONIUM BROMIDE 10 MG/ML (PF) SYRINGE
PREFILLED_SYRINGE | INTRAVENOUS | Status: DC | PRN
  Administered 2018-08-16: 70 mg via INTRAVENOUS

## 2018-08-16 MED ORDER — BUPIVACAINE HCL (PF) 0.25 % IJ SOLN
INTRAMUSCULAR | Status: AC
  Filled 2018-08-16: qty 30

## 2018-08-16 MED ORDER — EPINEPHRINE PF 1 MG/ML IJ SOLN
INTRAMUSCULAR | Status: DC | PRN
  Administered 2018-08-16: 1 mg

## 2018-08-16 MED ORDER — LABETALOL HCL 5 MG/ML IV SOLN
INTRAVENOUS | Status: DC | PRN
  Administered 2018-08-16: 20 mg via INTRAVENOUS

## 2018-08-16 MED ORDER — HYDROMORPHONE HCL 1 MG/ML IJ SOLN
INTRAMUSCULAR | Status: AC
  Administered 2018-08-16: 0.5 mg via INTRAVENOUS
  Filled 2018-08-16: qty 1

## 2018-08-16 MED ORDER — SUGAMMADEX SODIUM 200 MG/2ML IV SOLN
INTRAVENOUS | Status: DC | PRN
  Administered 2018-08-16: 500 mg via INTRAVENOUS

## 2018-08-16 MED ORDER — HYDROMORPHONE HCL 1 MG/ML IJ SOLN
0.2500 mg | INTRAMUSCULAR | Status: DC | PRN
  Administered 2018-08-16 (×2): 0.5 mg via INTRAVENOUS

## 2018-08-16 MED ORDER — BUPIVACAINE HCL (PF) 0.25 % IJ SOLN
INTRAMUSCULAR | Status: DC | PRN
  Administered 2018-08-16: 20 mL

## 2018-08-16 MED ORDER — SUCCINYLCHOLINE CHLORIDE 20 MG/ML IJ SOLN
INTRAMUSCULAR | Status: DC | PRN
  Administered 2018-08-16: 140 mg via INTRAVENOUS

## 2018-08-16 MED ORDER — ALBUTEROL SULFATE (2.5 MG/3ML) 0.083% IN NEBU
2.5000 mg | INHALATION_SOLUTION | Freq: Once | RESPIRATORY_TRACT | Status: AC
  Administered 2018-08-16: 2.5 mg via RESPIRATORY_TRACT

## 2018-08-16 MED ORDER — ALBUTEROL SULFATE (2.5 MG/3ML) 0.083% IN NEBU
INHALATION_SOLUTION | RESPIRATORY_TRACT | Status: AC
  Administered 2018-08-16: 2.5 mg via RESPIRATORY_TRACT
  Filled 2018-08-16: qty 3

## 2018-08-16 MED ORDER — EPINEPHRINE PF 1 MG/ML IJ SOLN
INTRAMUSCULAR | Status: AC
  Filled 2018-08-16: qty 1

## 2018-08-16 MED ORDER — DEXAMETHASONE SODIUM PHOSPHATE 10 MG/ML IJ SOLN
INTRAMUSCULAR | Status: AC
  Filled 2018-08-16: qty 1

## 2018-08-16 SURGICAL SUPPLY — 40 items
BANDAGE ACE 6X5 VEL STRL LF (GAUZE/BANDAGES/DRESSINGS) ×3 IMPLANT
BLADE CUDA 5.5 (BLADE) IMPLANT
BLADE CUTTER GATOR 3.5 (BLADE) IMPLANT
BLADE GREAT WHITE 4.2 (BLADE) ×2 IMPLANT
BLADE GREAT WHITE 4.2MM (BLADE) ×1
BNDG ELASTIC 6X10 VLCR STRL LF (GAUZE/BANDAGES/DRESSINGS) ×3 IMPLANT
BNDG GAUZE ELAST 4 BULKY (GAUZE/BANDAGES/DRESSINGS) ×3 IMPLANT
COVER WAND RF STERILE (DRAPES) ×3 IMPLANT
CUFF TOURNIQUET SINGLE 34IN LL (TOURNIQUET CUFF) IMPLANT
CUFF TOURNIQUET SINGLE 44IN (TOURNIQUET CUFF) IMPLANT
DRAPE ARTHROSCOPY W/POUCH 114 (DRAPES) ×3 IMPLANT
DRAPE HALF SHEET 40X57 (DRAPES) ×3 IMPLANT
DRAPE U-SHAPE 47X51 STRL (DRAPES) ×3 IMPLANT
DRSG EMULSION OIL 3X3 NADH (GAUZE/BANDAGES/DRESSINGS) ×3 IMPLANT
DRSG PAD ABDOMINAL 8X10 ST (GAUZE/BANDAGES/DRESSINGS) ×3 IMPLANT
DURAPREP 26ML APPLICATOR (WOUND CARE) ×3 IMPLANT
FILTER STRAW FLUID ASPIR (MISCELLANEOUS) ×3 IMPLANT
GAUZE SPONGE 4X4 12PLY STRL (GAUZE/BANDAGES/DRESSINGS) ×3 IMPLANT
GLOVE BIOGEL PI IND STRL 8 (GLOVE) ×2 IMPLANT
GLOVE BIOGEL PI INDICATOR 8 (GLOVE) ×4
GLOVE ECLIPSE 7.5 STRL STRAW (GLOVE) ×6 IMPLANT
GOWN STRL REUS W/ TWL LRG LVL3 (GOWN DISPOSABLE) ×1 IMPLANT
GOWN STRL REUS W/ TWL XL LVL3 (GOWN DISPOSABLE) ×2 IMPLANT
GOWN STRL REUS W/TWL LRG LVL3 (GOWN DISPOSABLE) ×2
GOWN STRL REUS W/TWL XL LVL3 (GOWN DISPOSABLE) ×4
KIT BASIN OR (CUSTOM PROCEDURE TRAY) ×3 IMPLANT
KIT TURNOVER KIT B (KITS) ×3 IMPLANT
NEEDLE 18GX1X1/2 (RX/OR ONLY) (NEEDLE) ×3 IMPLANT
PACK ARTHROSCOPY DSU (CUSTOM PROCEDURE TRAY) ×3 IMPLANT
PAD ABD 8X10 STRL (GAUZE/BANDAGES/DRESSINGS) ×3 IMPLANT
PAD ARMBOARD 7.5X6 YLW CONV (MISCELLANEOUS) ×6 IMPLANT
PAD CAST 4YDX4 CTTN HI CHSV (CAST SUPPLIES) ×2 IMPLANT
PADDING CAST COTTON 4X4 STRL (CAST SUPPLIES) ×4
SET ARTHROSCOPY TUBING (MISCELLANEOUS) ×2
SET ARTHROSCOPY TUBING LN (MISCELLANEOUS) ×1 IMPLANT
SUT ETHILON 4 0 PS 2 18 (SUTURE) ×3 IMPLANT
SYR 5ML LL (SYRINGE) ×3 IMPLANT
TOWEL OR 17X24 6PK STRL BLUE (TOWEL DISPOSABLE) ×3 IMPLANT
TOWEL OR 17X26 10 PK STRL BLUE (TOWEL DISPOSABLE) ×3 IMPLANT
WRAP KNEE MAXI GEL POST OP (GAUZE/BANDAGES/DRESSINGS) ×3 IMPLANT

## 2018-08-16 NOTE — Brief Op Note (Signed)
08/16/2018  12:33 PM  PATIENT:  Hailey Barnes  57 y.o. female  PRE-OPERATIVE DIAGNOSIS:  LEFT KNEE MEDIAL MENISCUS TEAR  POST-OPERATIVE DIAGNOSIS:  LEFT KNEE MEDIAL MENISCUS TEAR  PROCEDURE:  Procedure(s): ARTHROSCOPY KNEE PARTIAL MEDIAL MENISECTOMY, CHONDROPLASTY MEDIAL AND LATERAL, PLICA RELEASE MEDIAL (Left)  SURGEON:  Surgeon(s) and Role:    Dorna Leitz, MD - Primary  PHYSICIAN ASSISTANT:   ASSISTANTS: jim bethune   ANESTHESIA:   general  EBL:  none   BLOOD ADMINISTERED:none  DRAINS: none   LOCAL MEDICATIONS USED:  MARCAINE     SPECIMEN:  No Specimen  DISPOSITION OF SPECIMEN:  N/A  COUNTS:  YES  TOURNIQUET:  * No tourniquets in log *  DICTATION: .Other Dictation: Dictation Number [id number]  PLAN OF CARE: Discharge to home after PACU  PATIENT DISPOSITION:  PACU - hemodynamically stable.   Delay start of Pharmacological VTE agent (>24hrs) due to surgical blood loss or risk of bleeding: no

## 2018-08-16 NOTE — Anesthesia Postprocedure Evaluation (Signed)
Anesthesia Post Note  Patient: JUDINE ARCINIEGA  Procedure(s) Performed: ARTHROSCOPY KNEE PARTIAL MEDIAL MENISECTOMY, CHONDROPLASTY MEDIAL AND LATERAL, PLICA RELEASE MEDIAL (Left Knee)     Patient location during evaluation: PACU Anesthesia Type: General Level of consciousness: awake and alert Pain management: pain level controlled Vital Signs Assessment: post-procedure vital signs reviewed and stable Respiratory status: spontaneous breathing, nonlabored ventilation and respiratory function stable Cardiovascular status: blood pressure returned to baseline and stable Postop Assessment: no apparent nausea or vomiting Anesthetic complications: no    Last Vitals:  Vitals:   08/16/18 1350 08/16/18 1412  BP: (!) 147/98 (!) 152/99  Pulse: 77 75  Resp: (!) 21 16  Temp: 36.7 C   SpO2: (!) 88% 96%    Last Pain:  Vitals:   08/16/18 1412  TempSrc:   PainSc: 2                  Elishia Kaczorowski,W. EDMOND

## 2018-08-16 NOTE — Transfer of Care (Signed)
Immediate Anesthesia Transfer of Care Note  Patient: Hailey Barnes  Procedure(s) Performed: ARTHROSCOPY KNEE PARTIAL MEDIAL MENISECTOMY, CHONDROPLASTY MEDIAL AND LATERAL, PLICA RELEASE MEDIAL (Left Knee)  Patient Location: PACU  Anesthesia Type:General  Level of Consciousness: drowsy and patient cooperative  Airway & Oxygen Therapy: Patient Spontanous Breathing and Patient connected to face mask oxygen  Post-op Assessment: Report given to RN and Post -op Vital signs reviewed and stable  Post vital signs: Reviewed and stable  Last Vitals:  Vitals Value Taken Time  BP 131/106 08/16/2018 12:50 PM  Temp    Pulse 80 08/16/2018 12:51 PM  Resp 25 08/16/2018 12:51 PM  SpO2 98 % 08/16/2018 12:51 PM  Vitals shown include unvalidated device data.  Last Pain:  Vitals:   08/16/18 1007  TempSrc:   PainSc: 0-No pain         Complications: No apparent anesthesia complications

## 2018-08-16 NOTE — Op Note (Signed)
Hailey Barnes, [facility] MEDICAL RECORD [id number] ACCOUNT [id number] DATE OF BIRTH:23-Sep-1961 FACILITY: [facility] LOCATION: [facility] PHYSICIAN:Edenilson Austad L. Luca Burston, MD  OPERATIVE REPORT  DATE OF PROCEDURE:  08/16/2018  PREOPERATIVE DIAGNOSES:  Medial meniscal tear with chondromalacia, tricompartmental.  POSTOPERATIVE DIAGNOSES: 1.  Medial meniscal tear. 2.  Chondromalacia of patellofemoral joint. 3.  Chondromalacia medial femoral condyle. 4.  Medial shelf plica.  PROCEDURE: 1.  Partial posterior horn medial meniscectomy. 2.  Chondroplasty medial and patellofemoral down to bleeding bone were necessary. 3.  Medial shelf plica excision.  SURGEON:  Dorna Leitz, MD  ASSISTANT:  Gaspar Skeeters, PA-C, who was present throughout the case and assisted by providing retraction and mobility of the leg to enhance visualization and closure to minimize OR time.  BRIEF HISTORY:  The patient is a 57 year old female with a long history of complaints of left knee pain.  She has been treated conservatively for a prolonged period of time.  After failure of all conservative care, she was taken to the operating room for  left knee arthroscopy.  The patient does have a very high BMI need to be done in the main hospital because of these concerns.  DESCRIPTION OF PROCEDURE:  The patient was taken to the operating room after adequate anesthesia was obtained with general anesthetic, the patient was taken to the operating table.  Left leg was prepped and draped in the usual sterile fashion.  Following  this, routine arthroscopic examination of knee revealed there was severe chondromalacia of patellofemoral joint which was debrided back to a smooth stable rim of articular cartilage.  Attention was then turned medially where the medial shelf plica was  identified and debrided back to a smooth and stable rim of articular cartilage.  The attention was then turned towards the ACL, which was normal on the lateral side, which was  normal.  Once the posterior horn meniscal tear, medial plica, and  chondromalacia of the medial and patellofemoral compartments had been identified, and debrided the patient was taken to recovery was noted to be in satisfactory condition.    Prior taken to the recovery room, the patient was instilled with 20 mL of 0.25% Marcaine and she had a sterile compressive dressing applied.  AN/NUANCE  D:08/16/2018 T:08/16/2018 JOB:[id number]

## 2018-08-16 NOTE — Discharge Instructions (Signed)
POST-OP KNEE ARTHROSCOPY INSTRUCTIONS  Dr. Alain Marion PA-C  Pain You will be expected to have a moderate amount of pain in the affected knee for approximately two weeks. However, the first two days will be the most severe pain. A prescription has been provided to take as needed for the pain. The pain can be reduced by applying ice packs to the knee for the first 1-2 weeks post surgery. Also, keeping the leg elevated on pillows will help alleviate the pain. If you develop any acute pain or swelling in your calf muscle, please call the doctor.  Activity It is preferred that you stay at bed rest for approximately 24 hours. However, you may go to the bathroom with help. Weight bearing as tolerated. You may begin the knee exercises the day of surgery. Discontinue crutches as the knee pain resolves.  Dressing Keep the dressing dry. If the ace bandage should wrinkle or roll up, this can be rewrapped to prevent ridges in the bandage. You may remove all dressings in 48 hours,  apply bandaids to each wound. You may shower on the 4th day after surgery but no tub bath.  Symptoms to report to your doctor Extreme pain Extreme swelling Temperature above 101 degrees Change in the feeling, color, or movement of your toes Redness, heat, or swelling at your incision  Exercise If is preferred that as soon as possible you try to do a straight leg raise without bending the knee and concentrate on bringing the heel of your foot off the bed up to approximately 45 degrees and hold for the count of 10 seconds. Repeat this at least 10 times three or four times per day. Additional exercises are provided below.  You are encouraged to bend the knee as tolerated.  Follow-Up Call to schedule a follow-up appointment in 5-7 days. Our office # is [phone].  POST-OP EXERCISES  Short Arc Quads  1. Lie on back with legs straight. Place towel roll under thigh, just above knee. 2. Tighten thigh muscles to  straighten knee and lift heel off bed. 3. Hold for slow count of five, then lower. 4. Do three sets of ten    Straight Leg Raises  1. Lie on back with operative leg straight and other leg bent. 2. Keeping operative leg completely straight, slowly lift operative leg so foot is 5 inches off bed. 3. Hold for slow count of five, then lower. 4. Do three sets of ten.    DO BOTH EXERCISES 2 TIMES A DAY  Ankle Pumps  Work/move the operative ankle and foot up and down 10 times every hour while awake.   Post Anesthesia Home Care Instructions  Activity: Get plenty of rest for the remainder of the day. A responsible individual must stay with you for 24 hours following the procedure.  For the next 24 hours, DO NOT: -Drive a car -Paediatric nurse -Drink alcoholic beverages -Take any medication unless instructed by your physician -Make any legal decisions or sign important papers.  Meals: Start with liquid foods such as gelatin or soup. Progress to regular foods as tolerated. Avoid greasy, spicy, heavy foods. If nausea and/or vomiting occur, drink only clear liquids until the nausea and/or vomiting subsides. Call your physician if vomiting continues.  Special Instructions/Symptoms: Your throat may feel dry or sore from the anesthesia or the breathing tube placed in your throat during surgery. If this causes discomfort, gargle with warm salt water. The discomfort should disappear within 24 hours.  If you  had a scopolamine patch placed behind your ear for the management of post- operative nausea and/or vomiting:  1. The medication in the patch is effective for 72 hours, after which it should be removed.  Wrap patch in a tissue and discard in the trash. Wash hands thoroughly with soap and water. 2. You may remove the patch earlier than 72 hours if you experience unpleasant side effects which may include dry mouth, dizziness or visual disturbances. 3. Avoid touching the patch. Wash your  hands with soap and water after contact with the patch.

## 2018-08-16 NOTE — Anesthesia Procedure Notes (Signed)
Procedure Name: Intubation Date/Time: 08/16/2018 12:07 PM Performed by: White, Amedeo Plenty, CRNA Pre-anesthesia Checklist: Patient identified, Emergency Drugs available, Suction available and Patient being monitored Patient Re-evaluated:Patient Re-evaluated prior to induction Oxygen Delivery Method: Circle System Utilized Preoxygenation: Pre-oxygenation with 100% oxygen Induction Type: IV induction Ventilation: Two handed mask ventilation required and Oral airway inserted - appropriate to patient size Laryngoscope Size: Mac and 4 Grade View: Grade II Tube type: Oral Tube size: 7.0 mm Number of attempts: 1 Airway Equipment and Method: Stylet and Oral airway Placement Confirmation: ETT inserted through vocal cords under direct vision,  positive ETCO2,  breath sounds checked- equal and bilateral and CO2 detector Secured at: 22 cm Tube secured with: Tape Dental Injury: Teeth and Oropharynx as per pre-operative assessment

## 2018-08-17 ENCOUNTER — Encounter (HOSPITAL_COMMUNITY): Payer: Self-pay | Admitting: Orthopedic Surgery

## 2018-08-24 DIAGNOSIS — M25462 Effusion, left knee: Secondary | ICD-10-CM | POA: Diagnosis not present

## 2018-08-24 DIAGNOSIS — R262 Difficulty in walking, not elsewhere classified: Secondary | ICD-10-CM | POA: Diagnosis not present

## 2018-08-24 DIAGNOSIS — M25662 Stiffness of left knee, not elsewhere classified: Secondary | ICD-10-CM | POA: Diagnosis not present

## 2018-08-24 DIAGNOSIS — M6281 Muscle weakness (generalized): Secondary | ICD-10-CM | POA: Diagnosis not present

## 2018-08-25 DIAGNOSIS — M6281 Muscle weakness (generalized): Secondary | ICD-10-CM | POA: Diagnosis not present

## 2018-08-25 DIAGNOSIS — R262 Difficulty in walking, not elsewhere classified: Secondary | ICD-10-CM | POA: Diagnosis not present

## 2018-08-25 DIAGNOSIS — M25662 Stiffness of left knee, not elsewhere classified: Secondary | ICD-10-CM | POA: Diagnosis not present

## 2018-08-30 DIAGNOSIS — M25662 Stiffness of left knee, not elsewhere classified: Secondary | ICD-10-CM | POA: Diagnosis not present

## 2018-08-30 DIAGNOSIS — M6281 Muscle weakness (generalized): Secondary | ICD-10-CM | POA: Diagnosis not present

## 2018-08-30 DIAGNOSIS — R262 Difficulty in walking, not elsewhere classified: Secondary | ICD-10-CM | POA: Diagnosis not present

## 2018-08-30 IMAGING — XA Imaging study
2 series · 2 of 2 positions shown · non-contrast
Comparison: none

CLINICAL DATA: Lumbosacral spondylosis without myelopathy. Low back
pain and right greater than left lower extremity pain. Good response
to the prior injection, though pain has returned.

[Series 1: ortho adipose · 1 of 1 slices shown (1 of 2)]
[im 1/1]
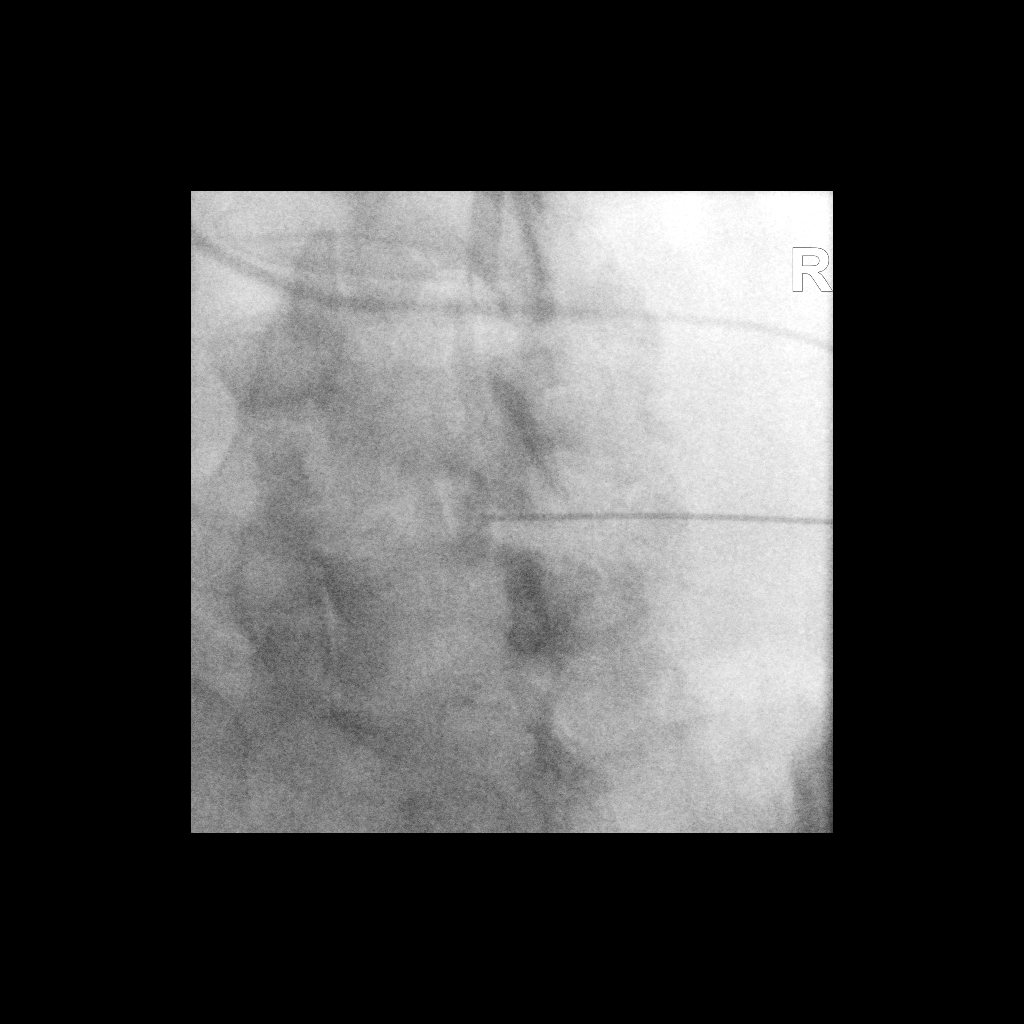

[Series 2: ortho adipose · 1 of 1 slices shown (2 of 2)]
[im 1/1]
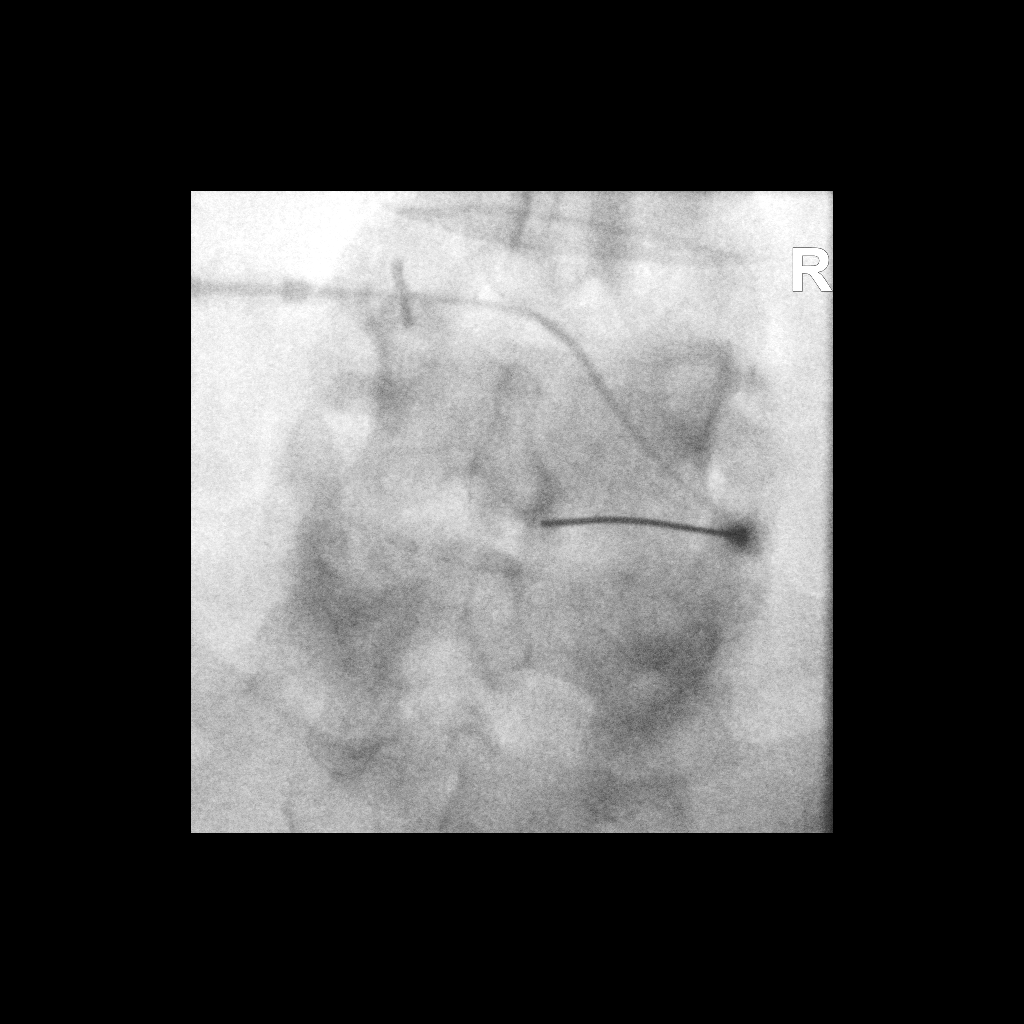

[2 of 2 positions shown; findings below may reference images not displayed]

FLUOROSCOPY TIME:  Radiation Exposure Index (as provided by the
fluoroscopic device): 126.62 microGray*m^2

Fluoroscopy Time (in minutes and seconds):  15 seconds

PROCEDURE:
The procedure, risks, benefits, and alternatives were explained to
the patient. Questions regarding the procedure were encouraged and
answered. The patient understands and consents to the procedure.

LUMBAR EPIDURAL INJECTION:

An interlaminar approach was performed on the right at L4-5. The
overlying skin was cleansed and anesthetized. A 6 inch 20 gauge
epidural needle was advanced using loss-of-resistance technique.

DIAGNOSTIC EPIDURAL INJECTION:

Injection of Isovue-M 200 shows a good epidural pattern with spread
above and below the level of needle placement, primarily on the
right. No vascular opacification is seen.

THERAPEUTIC EPIDURAL INJECTION:

120 mg of Depo-Medrol mixed with 3 mL of 1% lidocaine were
instilled. The procedure was well-tolerated, and the patient was
discharged thirty minutes following the injection in good condition.

COMPLICATIONS:
None
IMPRESSION: Technically successful lumbar interlaminar epidural injection on the
right at L4-5.

## 2018-09-01 DIAGNOSIS — R262 Difficulty in walking, not elsewhere classified: Secondary | ICD-10-CM | POA: Diagnosis not present

## 2018-09-01 DIAGNOSIS — M25662 Stiffness of left knee, not elsewhere classified: Secondary | ICD-10-CM | POA: Diagnosis not present

## 2018-09-01 DIAGNOSIS — M6281 Muscle weakness (generalized): Secondary | ICD-10-CM | POA: Diagnosis not present

## 2018-09-03 ENCOUNTER — Ambulatory Visit (INDEPENDENT_AMBULATORY_CARE_PROVIDER_SITE_OTHER): Payer: 59 | Admitting: Sports Medicine

## 2018-09-03 ENCOUNTER — Encounter: Payer: Self-pay | Admitting: Sports Medicine

## 2018-09-03 DIAGNOSIS — M180 Bilateral primary osteoarthritis of first carpometacarpal joints: Secondary | ICD-10-CM

## 2018-09-03 DIAGNOSIS — M1811 Unilateral primary osteoarthritis of first carpometacarpal joint, right hand: Secondary | ICD-10-CM | POA: Diagnosis not present

## 2018-09-03 DIAGNOSIS — M170 Bilateral primary osteoarthritis of knee: Secondary | ICD-10-CM | POA: Diagnosis not present

## 2018-09-03 DIAGNOSIS — M1812 Unilateral primary osteoarthritis of first carpometacarpal joint, left hand: Secondary | ICD-10-CM

## 2018-09-03 NOTE — Assessment & Plan Note (Signed)
Right first Hailey Barnes is pain-free after injection back in May, left first [place] injection today, previous injection was in June.

## 2018-09-03 NOTE — Assessment & Plan Note (Signed)
Recently post left knee arthroscopy with meniscectomy. Similar symptoms on the right, her injection was last done 2 months ago, too soon to consider another injection, ordering a right knee MRI and referral back to Dr. Berenice Primas. Ultimately she does need bilateral knee arthroplasties.

## 2018-09-03 NOTE — Progress Notes (Signed)
Subjective:    CC: Follow-up  HPI: Knee osteoarthritis: Status post left knee arthroscopy with aggressive meniscal debridement and debridement of chondromalacia.  Still with significant pain.  She has similar symptoms in her right knee, mild mechanical symptoms, medial joint line pain.  Her last injection of the right knee was only 2 months ago.  Morbid obesity: Has not yet followed through with her bariatric surgery referral, she did get a call from them.  Hand pain: Left CMC osteoarthritis, previous injection was 5 months ago, desires repeat, pain is moderate, persistent, localized without radiation.  I reviewed the past medical history, family history, social history, surgical history, and allergies today and no changes were needed.  Please see the problem list section below in epic for further details.  Past Medical History: Past Medical History:  Diagnosis Date  . Arthritis    "knees" (02/21/2014), back  . CHF (congestive heart failure) ([employer])   . Chronic bronchitis ([place])    "get it q yr" (02/21/2014)  . Chronic diastolic CHF (congestive heart failure) ([place])    a. Echo 10/23/2016: EF 75 %  . Chronic lower back pain   . Complication of anesthesia    "I don't come out well; I chew on my tongue"  . COPD (chronic obstructive pulmonary disease) ([place])   . Diabetes mellitus without complication ([place])    TYPE 2  . Family history of adverse reaction to anesthesia    BROTHER PONV, had a blood clot several days later and heart stopped  . Fatty liver    NONALCOLIC  . Fibroid   . Heart murmur   . Hypertension   . Infection of right eye    current dx on Saturday 7/15 - using drops  . Leg swelling    bilateral  . Migraine 1982-2009  . MVP (mitral valve prolapse)   . Neuromuscular disorder ([place])    right leg nerve pain  . Shortness of breath    WITH EXERTION  . Sinus pause    a. noted on telemetry during admission from 10/2016, lasting up to 4.4 seconds. BB discontinued.   . Sleep  apnea 02/2016   CPAP 16 TO 20  . Stroke [facility]) noted on CAT 02/2014   "light", LLE weakness remains (03/30/2014)  . 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 Tingling in extremities 12/12/2010   Uric acid and electrolytes (02/23) normal but WBC elevated.   D/Dx: carpal tunnel, ulnar neuropathy Less likely: cervical radiculopathy, vasculitis    Past Surgical History: Past Surgical History:  Procedure Laterality Date  . [place]  . CHOLECYSTECTOMY N/A 06/17/2017   Procedure: LAPAROSCOPIC CHOLECYSTECTOMY WITH INTRAOPERATIVE CHOLANGIOGRAM;  Surgeon: Donnie Mesa, MD;  Location: [facility];  Service: General;  Laterality: N/A;  . COLONOSCOPY WITH PROPOFOL N/A 04/28/2017   Procedure: COLONOSCOPY WITH PROPOFOL;  Surgeon: Manus Gunning, MD;  Location: [facility];  Service: Gastroenterology;  Laterality: N/A;  . DILATATION & CURETTAGE/HYSTEROSCOPY WITH MYOSURE N/A 05/06/2016   Procedure: DILATATION & CURETTAGE/HYSTEROSCOPY WITH MYOSURE;  Surgeon: Terrance Mass, MD;  Location: [facility];  Service: Gynecology;  Laterality: N/A;  request to follow around 8:45  requests one hour OR time  . DILATATION & CURETTAGE/HYSTEROSCOPY WITH MYOSURE N/A 04/03/2017   Procedure: DILATATION & CURETTAGE/HYSTEROSCOPY WITH MYOSURE;  Surgeon: Terrance Mass, MD;  Location: [facility];  Service: Gynecology;  Laterality: N/A;  . DILATION AND CURETTAGE OF UTERUS    .  ESOPHAGOGASTRODUODENOSCOPY (EGD) WITH PROPOFOL N/A 04/28/2017   Procedure: ESOPHAGOGASTRODUODENOSCOPY (EGD) WITH PROPOFOL;  Surgeon: Manus Gunning, MD;  Location: [facility];  Service: Gastroenterology;  Laterality: N/A;  . FOOT SURGERY Bilateral    "took bones out; put pins in"  . KNEE ARTHROSCOPY Left 08/16/2018   Procedure: ARTHROSCOPY KNEE PARTIAL MEDIAL MENISECTOMY, CHONDROPLASTY MEDIAL AND LATERAL, PLICA RELEASE MEDIAL;  Surgeon: Dorna Leitz, MD;  Location: [facility];  Service:  Orthopedics;  Laterality: Left;  . LEFT AND RIGHT HEART CATHETERIZATION WITH CORONARY ANGIOGRAM N/A 03/31/2014   Procedure: LEFT AND RIGHT HEART CATHETERIZATION WITH CORONARY ANGIOGRAM;  Surgeon: Birdie Riddle, MD;  Location: [facility];  Service: Cardiovascular;  Laterality: N/A;  . MYOMECTOMY  YRS AGO  . sonohystogram  04/14/2016   [facility]: intramural fibroid, premenopausal endometrium, right fluid filled tubular structure   Social History: Social History   Socioeconomic History  . Marital status: Divorced    Spouse name: Not on file  . Number of children: 1  . Years of education: Not on file  . Highest education level: Not on file  Occupational History  . Not on file  Social Needs  . Financial resource strain: Not on file  . Food insecurity:    Worry: Not on file    Inability: Not on file  . Transportation needs:    Medical: Not on file    Non-medical: Not on file  Tobacco Use  . Smoking status: Current Every Day Smoker    Packs/day: 0.50    Years: 40.00    Pack years: 20.00    Types: Cigarettes  . Smokeless tobacco: Never Used  Substance and Sexual Activity  . Alcohol use: Yes    Comment: occasional  . Drug use: No    Types: "Crack" cocaine    Comment: 03/30/2014 "stopped using crack 02/21/2014"  . Sexual activity: Yes    Birth control/protection: Post-menopausal  Lifestyle  . Physical activity:    Days per week: Not on file    Minutes per session: Not on file  . Stress: Not on file  Relationships  . Social connections:    Talks on phone: Not on file    Gets together: Not on file    Attends religious service: Not on file    Active member of club or organization: Not on file    Attends meetings of clubs or organizations: Not on file    Relationship status: Not on file  Other Topics Concern  . Not on file  Social History Narrative   Married 11/16/2009 to [place] whom she met at Rehab.    Enjoys church at [employer], [place]  geared towards people with substance abuse history.       Financial assistance approved for 100% discount at [facility] and has [facility] card Bonna Gains   June 27, 2010 11:36 am   Family History: Family History  Problem Relation Age of Onset  . Hypertension Mother   . COPD Mother   . Hypertension Father   . Cancer Sister        UTERINE???  . COPD Sister   . Hypertension Sister   . Hypertension Sister    Allergies: No Known Allergies Medications: See med rec.  Review of Systems: No fevers, chills, night sweats, weight loss, chest pain, or shortness of breath.   Objective:    General: Well Developed, well nourished, and in no acute distress.  Neuro: Alert and oriented x3, extra-ocular muscles intact, sensation  grossly intact.  HEENT: Normocephalic, atraumatic, pupils equal round reactive to light, neck supple, no masses, no lymphadenopathy, thyroid nonpalpable.  Skin: Warm and dry, no rashes. Cardiac: Regular rate and rhythm, no murmurs rubs or gallops, no lower extremity edema.  Respiratory: Clear to auscultation bilaterally. Not using accessory muscles, speaking in full sentences.  Procedure: Real-time Ultrasound Guided Injection of left first [place] [facility] Device: GE Logiq E  Verbal informed consent obtained.  Time-out conducted.  Noted no overlying erythema, induration, or other signs of local infection.  Skin prepped in a sterile fashion.  Local anesthesia: Topical Ethyl chloride.  With sterile technique and under real time ultrasound guidance: 1/2 cc Kenalog 40, 1/2 cc lidocaine injected easily Completed without difficulty  Pain immediately resolved suggesting accurate placement of the medication.  Advised to call if fevers/chills, erythema, induration, drainage, or persistent bleeding.  Images permanently stored and available for review in the ultrasound unit.  Impression: Technically successful ultrasound guided injection.  Impression and Recommendations:    Primary  osteoarthritis of both knees Recently post left knee arthroscopy with meniscectomy. Similar symptoms on the right, her injection was last done 2 months ago, too soon to consider another injection, ordering a right knee MRI and referral back to Dr. Berenice Primas. Ultimately she does need bilateral knee arthroplasties.  Primary osteoarthritis of both first carpometacarpal joints Right first [place] is pain-free after injection back in May, left first [place] injection today, previous injection was in June. ___________________________________________ Gwen Her. Dianah Field, M.D., ABFM., CAQSM. Primary Care and Sports Medicine [facility]  Adjunct Professor of [place] of [facility] [employer] [employer]

## 2018-09-07 ENCOUNTER — Ambulatory Visit (INDEPENDENT_AMBULATORY_CARE_PROVIDER_SITE_OTHER): Payer: 59

## 2018-09-07 ENCOUNTER — Other Ambulatory Visit: Payer: Self-pay | Admitting: Family Medicine

## 2018-09-07 ENCOUNTER — Telehealth: Payer: Self-pay

## 2018-09-07 VITALS — BP 148/100 | HR 84 | Temp 98.4°F | Ht 63.0 in | Wt 288.0 lb

## 2018-09-07 DIAGNOSIS — Z0000 Encounter for general adult medical examination without abnormal findings: Secondary | ICD-10-CM | POA: Diagnosis not present

## 2018-09-07 DIAGNOSIS — Z23 Encounter for immunization: Secondary | ICD-10-CM | POA: Diagnosis not present

## 2018-09-07 MED ORDER — ACCU-CHEK SOFT TOUCH LANCETS MISC: 12 refills | Status: DC

## 2018-09-07 MED ORDER — ACCU-CHEK AVIVA PLUS W/DEVICE KIT: 1.0000 | PACK | Freq: Every day | 0 refills | Status: DC

## 2018-09-07 MED ORDER — GLUCOSE BLOOD VI STRP: ORAL_STRIP | 12 refills | Status: DC

## 2018-09-07 NOTE — Patient Instructions (Signed)
Screening for Type 2 Diabetes A screening test for type 2 diabetes (type 2 diabetes mellitus) is a blood test to measure your blood sugar (glucose) level. This test is done to check for early signs of diabetes, before you develop symptoms. Type 2 diabetes is a long-term (chronic) disease that occurs when the pancreas does not make enough of a hormone called insulin. This results in high blood glucose levels, which can cause many complications. You may be screened for type 2 diabetes as part of your regular health care, especially if you have a high risk for diabetes. Screening can help identify type 2 diabetes at its early stage (prediabetes). Identifying and treating prediabetes may delay or prevent development of type 2 diabetes. What are the risk factors for type 2 diabetes? The following factors may make you more likely to develop type 2 diabetes:  Having a parent or sibling (first-degree relative) who has diabetes.  Being overweight or obese.  Being of American-Indian, [place], Hispanic, Latino, Asian, or African-American descent.  Not getting enough exercise.  Being older than 29.  Having a history of diabetes during pregnancy (gestational diabetes).  Having low levels of good cholesterol (HDL-C) or high levels of blood fats (triglycerides).  Having high blood glucose in a previous blood test.  Having high blood pressure.  Having certain diseases or conditions, including: ? Acanthosis nigricans. This is a condition that causes dark skin on the neck, armpits, and groin. ? Polycystic ovary syndrome (PCOS). ? Heart disease.  Having delivered a baby who weighed more than 9 lb (4.1 kg).  Who should be screened for type 2 diabetes? Adults  Adults age 54 and older. These adults should be screened at least once every three years.  Adults who are younger than 44, overweight, and have at least one other risk factor. These adults should be screened at least once every  three years.  Adults who have normal blood glucose levels and two or more risk factors. These adults may be screened once every year (annually).  Women who have had gestational diabetes in the past. These women should be screened at least once every three years.  Pregnant women who have risk factors. These women should be screened at their first prenatal visit.  Pregnant women with no risk factors. These women should be screened between weeks 24 and 28 of pregnancy. Children and adolescents  Children and adolescents should be screened for type 2 diabetes if they are overweight and have 2 of the following risk factors: ? A family history of type 2 diabetes. ? Being a member of a high risk race or ethnic group. ? Signs of insulin resistance or conditions associated with insulin resistance. ? A mother who had gestational diabetes while pregnant with him or her.  Screening should be done at least once every three years, starting at age 36. Your health care provider or your child's health care provider may recommend having a screening more or less often. What happens during screening? During screening, your health care provider may ask questions about:  Your health and your risk factors, including your activity level and any medical conditions that you have.  The health of your first-degree relatives.  Past pregnancies, if this applies.  Your health care provider will also do a physical exam, including a blood pressure measurement and blood tests. There are four blood tests that can be used to screen for type 2 diabetes. You may have one or more of the following:  A  fasting plasma glucose test (FBG). You will not be allowed to eat for at least eight hours before a blood sample is taken.  A random blood glucose test. This test checks your blood glucose at any time of the day regardless of when you ate.  An oral glucose tolerance test (OGTT). This test measures your blood glucose at two  times: ? After you have not eaten (have fasted) overnight. ? Two hours after you drink a glucose-containing beverage. A diagnosis can be made if the level is greater than 200 mg/dL.  An A1c test. This test provides information about blood glucose control over the previous three months.  What do the results mean? Your test results are a measurement of how much glucose is in your blood. Normal blood glucose levels mean that you do not have diabetes or prediabetes. High blood glucose levels may mean that you have prediabetes or diabetes. Depending on the results, other tests may be needed to confirm the diagnosis. This information is not intended to replace advice given to you by your health care provider. Make sure you discuss any questions you have with your health care provider. Document Released: 08/02/2009 Document Revised: 03/13/2016 Document Reviewed: 08/03/2015 Elsevier Interactive Patient Education  2017 [employer].

## 2018-09-07 NOTE — Telephone Encounter (Signed)
Patient in for AWV. Never figured out how to use glucometer in past so threw it out.  Please send new glucometer, strips, lancets to [facility]. I have made her an appt with Dr Valentina Lucks for training and also for smoking cessation.  Patient would also like a referral for the wart on thumb. It never got better after freezing and she does not want it frozen again.  Her call back for questions is: [place], RN [facility] RN)

## 2018-09-07 NOTE — Telephone Encounter (Signed)
Orders placed for glucometer. Called patient to see if she wanted to schedule in our derm clinic or if she preferred outside derm referral but had to LVM.

## 2018-09-07 NOTE — Progress Notes (Signed)
Subjective:   Hailey Barnes is a 57 y.o. female who presents for Medicare Annual (Subsequent) preventive examination.  Review of Systems:  Physical assessment deferred to PCP.  Cardiac Risk Factors include: diabetes mellitus;dyslipidemia;sedentary lifestyle;obesity (BMI >30kg/m2);hypertension     Objective:    Vitals: BP (!) 148/100 Comment: No BP meds taken yet today: advised to go home and take.  Pulse 84   Temp 98.4 F (36.9 C) (Oral)   Ht 5' 3"  (1.6 m)   Wt 288 lb (130.6 kg)   LMP 04/06/2017   SpO2 95%   BMI 51.02 kg/m   Body mass index is 51.02 kg/m.  Advanced Directives 09/07/2018 08/12/2018 06/01/2018 05/03/2018 02/25/2018 02/18/2018 08/26/2017  Does Patient Have a Medical Advance Directive? No No No No No No No  Would patient like information on creating a medical advance directive? Yes (MAU/Ambulatory/Procedural Areas - Information given) No - Patient declined No - Patient declined No - Patient declined No - Patient declined No - Patient declined No - Patient declined  Pre-existing out of facility DNR order (yellow form or pink MOST form) - - - - - - -    Tobacco Social History   Tobacco Use  Smoking Status Current Every Day Smoker  . Packs/day: 0.50  . Years: 40.00  . Pack years: 20.00  . Types: Cigarettes  Smokeless Tobacco Never Used     Ready to quit: Yes Counseling given: No   Clinical Intake:  Pre-visit preparation completed: Yes  Pain : 0-10 Pain Score: 4  Pain Type: Chronic pain Pain Location: Knee Pain Orientation: Left Pain Onset: More than a month ago Pain Frequency: Constant     Nutritional Status: BMI > 30  Obese Nutritional Risks: None Diabetes: Yes CBG done?: No Did pt. bring in CBG monitor from home?: No  What is the last grade level you completed in school?: some college  Interpreter Needed?: No     Past Medical History:  Diagnosis Date  . Arthritis    "knees" (02/21/2014), back  . CHF (congestive heart failure) ([place])    . Chronic bronchitis ([place])    "get it q yr" (02/21/2014)  . Chronic diastolic CHF (congestive heart failure) ([place])    a. Echo 10/23/2016: EF 75 %  . Chronic lower back pain   . Complication of anesthesia    "I don't come out well; I chew on my tongue"  . COPD (chronic obstructive pulmonary disease) ([place])   . Diabetes mellitus without complication ([place])    TYPE 2  . Family history of adverse reaction to anesthesia    BROTHER PONV, had a blood clot several days later and heart stopped  . Fatty liver    NONALCOLIC  . Fibroid   . Heart murmur   . Hypertension   . Infection of right eye    current dx on Saturday 7/15 - using drops  . Leg swelling    bilateral  . Migraine 1982-2009  . MVP (mitral valve prolapse)   . Neuromuscular disorder ([place])    right leg nerve pain  . Shortness of breath    WITH EXERTION  . Sinus pause    a. noted on telemetry during admission from 10/2016, lasting up to 4.4 seconds. BB discontinued.   . Sleep apnea 02/2016   CPAP 16 TO 20  . Stroke [facility]) noted on CAT 02/2014   "light", LLE weakness remains (03/30/2014)  . Substance abuse ([place])    s/p Rehab. 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 Tingling in extremities 12/12/2010   Uric acid and electrolytes (02/23) normal but WBC elevated.   D/Dx: carpal tunnel, ulnar neuropathy Less likely: cervical radiculopathy, vasculitis    Past Surgical History:  Procedure Laterality Date  . [place]  . CHOLECYSTECTOMY N/A 06/17/2017   Procedure: LAPAROSCOPIC CHOLECYSTECTOMY WITH INTRAOPERATIVE CHOLANGIOGRAM;  Surgeon: Donnie Mesa, MD;  Location: [place];  Service: General;  Laterality: N/A;  . COLONOSCOPY WITH PROPOFOL N/A 04/28/2017   Procedure: COLONOSCOPY WITH PROPOFOL;  Surgeon: Manus Gunning, MD;  Location: [facility];  Service: Gastroenterology;  Laterality: N/A;  . DILATATION & CURETTAGE/HYSTEROSCOPY WITH MYOSURE N/A 05/06/2016   Procedure: DILATATION &  CURETTAGE/HYSTEROSCOPY WITH MYOSURE;  Surgeon: Terrance Mass, MD;  Location: [place] [facility];  Service: Gynecology;  Laterality: N/A;  request to follow around 8:45  requests one hour OR time  . DILATATION & CURETTAGE/HYSTEROSCOPY WITH MYOSURE N/A 04/03/2017   Procedure: DILATATION & CURETTAGE/HYSTEROSCOPY WITH MYOSURE;  Surgeon: Terrance Mass, MD;  Location: [place] [facility];  Service: Gynecology;  Laterality: N/A;  . DILATION AND CURETTAGE OF UTERUS    . ESOPHAGOGASTRODUODENOSCOPY (EGD) WITH PROPOFOL N/A 04/28/2017   Procedure: ESOPHAGOGASTRODUODENOSCOPY (EGD) WITH PROPOFOL;  Surgeon: Manus Gunning, MD;  Location: [facility];  Service: Gastroenterology;  Laterality: N/A;  . FOOT SURGERY Bilateral    "took bones out; put pins in"  . KNEE ARTHROSCOPY Left 08/16/2018   Procedure: ARTHROSCOPY KNEE PARTIAL MEDIAL MENISECTOMY, CHONDROPLASTY MEDIAL AND LATERAL, PLICA RELEASE MEDIAL;  Surgeon: Dorna Leitz, MD;  Location: [place];  Service: Orthopedics;  Laterality: Left;  . LEFT AND RIGHT HEART CATHETERIZATION WITH CORONARY ANGIOGRAM N/A 03/31/2014   Procedure: LEFT AND RIGHT HEART CATHETERIZATION WITH CORONARY ANGIOGRAM;  Surgeon: Birdie Riddle, MD;  Location: [place] [facility];  Service: Cardiovascular;  Laterality: N/A;  . MYOMECTOMY  YRS AGO  . sonohystogram  04/14/2016   [facility]: intramural fibroid, premenopausal endometrium, right fluid filled tubular structure   Family History  Problem Relation Age of Onset  . Hypertension Mother   . COPD Mother   . Hypertension Father   . Cancer Sister        UTERINE???  . COPD Sister   . Hypertension Sister   . Hypertension Sister    Social History   Socioeconomic History  . Marital status: Divorced    Spouse name: Not on file  . Number of children: 1  . Years of education: 22  . Highest education level: 12th grade  Occupational History  . Occupation: Psychologist, educational    Comment: disabled  . Occupation: patient care assistance    Social Needs  . Financial resource strain: Not hard at all  . Food insecurity:    Worry: Sometimes true    Inability: Sometimes true  . Transportation needs:    Medical: Yes    Non-medical: Yes  Tobacco Use  . Smoking status: Current Every Day Smoker    Packs/day: 0.50    Years: 40.00    Pack years: 20.00    Types: Cigarettes  . Smokeless tobacco: Never Used  Substance and Sexual Activity  . Alcohol use: Yes    Comment: very occasional  . Drug use: No    Types: "Crack" cocaine    Comment: 03/30/2014 "stopped using crack 02/21/2014"  . Sexual activity: Yes    Birth control/protection: Post-menopausal  Lifestyle  . Physical activity:    Days per week: 0 days    Minutes  per session: 0 min  . Stress: Not at all  Relationships  . Social connections:    Talks on phone: More than three times a week    Gets together: Never    Attends religious service: More than 4 times per year    Active member of club or organization: No    Attends meetings of clubs or organizations: Never    Relationship status: Divorced  Other Topics Concern  . Not on file  Social History Narrative   Divorced since 2015.   Lives alone. No pets. Daughter lives in [place]. Daughter helps with transportation. Talks daily on phone, sees once or twice a week. 2 grandkids.   Enjoys church at [employer], [place] geared towards people with substance abuse history.    Lives in duplex apartment, one level, three stairs to get in. Has handrails, has grab bars in bathroom. Smoke alarms.   Bakes most meals, avoids starches, rice. Eats vegetables. Drinks tea, ginger ale.    Likes to go to movies, used to walk daily but not now due to knee pain. Likes to spend time with grandchildren, go out to eat. Spend time with family and circle of friends.     Outpatient Encounter Medications as of 09/07/2018  Medication Sig  . albuterol (PROVENTIL HFA;VENTOLIN HFA) 108 (90 Base) MCG/ACT inhaler Inhale 2 puffs into the  lungs every 6 (six) hours as needed for wheezing or shortness of breath.   Marland Kitchen aspirin EC 81 MG tablet Take 81 mg by mouth daily.  Marland Kitchen atorvastatin (LIPITOR) 40 MG tablet Take 40 mg by mouth once a day (Patient taking differently: Take 40 mg by mouth daily. )  . HYDROcodone-acetaminophen (NORCO) 5-325 MG tablet Take 1-2 tablets by mouth every 6 (six) hours as needed for moderate pain.  Marland Kitchen lisinopril (PRINIVIL,ZESTRIL) 40 MG tablet Take 1 tablet (40 mg total) by mouth daily.  . metFORMIN (GLUCOPHAGE) 1000 MG tablet TAKE ONE TABLET BY MOUTH TWICE DAILY WITH  A  MEAL (Patient taking differently: Take 1,000 mg by mouth 2 (two) times daily with a meal. )  . mometasone-formoterol (DULERA) 200-5 MCG/ACT AERO Inhale 2 puffs into the lungs daily.   . pindolol (VISKEN) 5 MG tablet Take 1 tablet (5 mg total) by mouth 2 (two) times daily.  . Potassium Chloride ER 20 MEQ TBCR TAKE 1 TABLET BY MOUTH EVERY DAY (Patient taking differently: Take 20 mEq by mouth daily. )  . tiotropium (SPIRIVA) 18 MCG inhalation capsule Place 18 mcg into inhaler and inhale daily.   Marland Kitchen torsemide (DEMADEX) 20 MG tablet Take 3 tablets by mouth daily (Patient taking differently: Take 60 mg by mouth daily. )  . triamcinolone cream (KENALOG) 0.1 % APPLY ONE APPLICATION TOPICALLY THREE TIMES DAILY AS NEEDED (ECZEMA ON ARMS) (Patient taking differently: Apply 1 application topically 3 (three) times daily as needed (for eczema). )  . ibuprofen (ADVIL,MOTRIN) 600 MG tablet Take 1 tablet (600 mg total) by mouth every 8 (eight) hours as needed. (Patient not taking: Reported on 09/07/2018)  . traMADol (ULTRAM) 50 MG tablet Take 1 tablet (50 mg total) by mouth every 8 (eight) hours as needed for moderate pain. Maximum 6 tabs per day. (Patient not taking: Reported on 09/07/2018)   No facility-administered encounter medications on file as of 09/07/2018.     Activities of Daily Living In your present state of health, do you have any difficulty performing  the following activities: 09/07/2018 08/12/2018  Hearing? N N  Vision?  N N  Difficulty concentrating or making decisions? N N  Walking or climbing stairs? Y N  Dressing or bathing? N N  Doing errands, shopping? N N  Preparing Food and eating ? N -  Using the Toilet? N -  In the past six months, have you accidently leaked urine? N -  Do you have problems with loss of bowel control? N -  Managing your Medications? N -  Managing your Finances? N -  Housekeeping or managing your Housekeeping? N -  Some recent data might be hidden    Patient Care Team: [name], DO as PCP - General [name], MD as PCP - Cardiology (Cardiology) Kennith Center, RD as Dietitian (Family Medicine) Dorna Leitz, MD as Consulting Physician (Orthopedic Surgery) Silverio Decamp, MD as Consulting Physician (Family Medicine) Armbruster, Carlota Raspberry, MD as Consulting Physician (Gastroenterology)    Assessment:   This is a routine wellness examination for [place].  Exercise Activities and Dietary recommendations Current Exercise Habits: The patient does not participate in regular exercise at present, Exercise limited by: orthopedic condition(s)  Goals    . Blood Pressure < 140/90    . Weight (lb) < 200 lb (90.7 kg)     Wants to lose weight to get knee replacement surgery; has been referred to [facility].       Fall Risk Fall Risk  09/07/2018 06/01/2018 09/21/2017 08/21/2017 01/22/2016  Falls in the past year? 0 No No No No   Is the patient's home free of loose throw rugs in walkways, pet beds, electrical cords, etc?   yes      Grab bars in the bathroom? yes      Handrails on the stairs?   yes      Adequate lighting?   yes  Depression Screen PHQ 2/9 Scores 09/07/2018 06/01/2018 05/03/2018 03/25/2018  PHQ - 2 Score 0 0 0 0  PHQ- 9 Score - - - -     Cognitive Function MMSE - Mini Mental State Exam 09/07/2018  Orientation to time 5  Orientation to Place 5  Registration 3  Attention/  Calculation 5  Recall 3  Language- name 2 objects 2  Language- repeat 1  Language- follow 3 step command 3  Language- read & follow direction 1  Write a sentence 1  Copy design 1  Total score 30     6CIT Screen 09/07/2018  What Year? 0 points  What month? 0 points  What time? 0 points  Count back from 20 0 points  Months in reverse 0 points  Repeat phrase 0 points  Total Score 0    Immunization History  Administered Date(s) Administered  . Influenza,inj,Quad PF,6+ Mos 06/23/2014, 12/07/2015, 08/07/2016, 09/21/2017, 09/07/2018  . Pneumococcal Polysaccharide-23 03/31/2014   Flu vaccine updated today.  Screening Tests Health Maintenance  Topic Date Due  . FOOT EXAM  08/21/2018  . TETANUS/TDAP  09/08/2019 (Originally 03/04/1982)  . MAMMOGRAM  09/24/2018  . OPHTHALMOLOGY EXAM  11/05/2018  . HEMOGLOBIN A1C  02/11/2019  . PAP SMEAR  02/18/2021  . COLONOSCOPY  04/28/2022  . INFLUENZA VACCINE  Completed  . PNEUMOCOCCAL POLYSACCHARIDE VACCINE AGE 42-64 HIGH RISK  Completed  . Hepatitis C Screening  Completed  . HIV Screening  Completed    Cancer Screenings: Lung: Low Dose CT Chest recommended if Age 79-80 years, 30 pack-year currently smoking OR have quit w/in 15years. Patient does not qualify. Breast:  Up to date on Mammogram? Yes   Up to  date of Bone Density/Dexa? N/A Colorectal: up to date   Additional Screenings: : Hepatitis C Screening: complete      Plan:  Flu vaccine given today. Patient currently not checking blood sugars because she never figured out how to use glucometer and threw it away. Will ask PCP to prescribe new glucometer/supplies and made patient appt with Dr Valentina Lucks to be trained in glucometer use and to discuss smoking cessation. Patient would like a dermatology referral for wart on thumb, does not want it frozen again.   I have personally reviewed and noted the following in the patient's chart:   . Medical and social history . Use of alcohol,  tobacco or illicit drugs  . Current medications and supplements . Functional ability and status . Nutritional status . Physical activity . Advanced directives . List of other physicians . Hospitalizations, surgeries, and ER visits in previous 12 months . Vitals . Screenings to include cognitive, depression, and falls . Referrals and appointments  In addition, I have reviewed and discussed with patient certain preventive protocols, quality metrics, and best practice recommendations. A written personalized care plan for preventive services as well as general preventive health recommendations were provided to patient.     Esau Grew, RN  09/07/2018

## 2018-09-07 NOTE — Progress Notes (Signed)
I have reviewed this visit and agree with the documentation.   [name], DO PGY-2, [facility] 09/07/2018 3:59 PM

## 2018-09-08 NOTE — Telephone Encounter (Signed)
Patient has an appointment on 09/23/18 with our derm clinic.   ,CMA

## 2018-09-09 DIAGNOSIS — M1712 Unilateral primary osteoarthritis, left knee: Secondary | ICD-10-CM | POA: Diagnosis not present

## 2018-09-09 DIAGNOSIS — M25562 Pain in left knee: Secondary | ICD-10-CM | POA: Diagnosis not present

## 2018-09-09 DIAGNOSIS — M1711 Unilateral primary osteoarthritis, right knee: Secondary | ICD-10-CM | POA: Diagnosis not present

## 2018-09-09 DIAGNOSIS — M25561 Pain in right knee: Secondary | ICD-10-CM | POA: Diagnosis not present

## 2018-09-20 ENCOUNTER — Ambulatory Visit (INDEPENDENT_AMBULATORY_CARE_PROVIDER_SITE_OTHER): Payer: Medicare HMO | Admitting: Pharmacist

## 2018-09-20 ENCOUNTER — Encounter: Payer: Self-pay | Admitting: Pharmacist

## 2018-09-20 DIAGNOSIS — I10 Essential (primary) hypertension: Secondary | ICD-10-CM | POA: Diagnosis not present

## 2018-09-20 DIAGNOSIS — E7800 Pure hypercholesterolemia, unspecified: Secondary | ICD-10-CM

## 2018-09-20 DIAGNOSIS — Z720 Tobacco use: Secondary | ICD-10-CM

## 2018-09-20 MED ORDER — VARENICLINE TARTRATE 0.5 MG X 11 & 1 MG X 42 PO MISC: ORAL | 0 refills | Status: DC

## 2018-09-20 NOTE — Assessment & Plan Note (Signed)
Hyperlipidemia - reevaluated with lipid panel blood draw today. (last checked > 1 year ago).  Reports adherence with atorvastatin 22m daily.

## 2018-09-20 NOTE — Patient Instructions (Signed)
Great to see you today.   I am thrilled you are trying to quit smoking.    New prescription for Chantix sent to your pharmacy today.  Take this as prescribed by the punch out pack.    Follow up in early January.  Rx Clinic.

## 2018-09-20 NOTE — Assessment & Plan Note (Signed)
Blood pressure elevated - noted she was having cramps in her lower legs and due to this she has been avoiding her diuretic therapy.  Still takes potassium pills daily.  Repeat BMET today.

## 2018-09-20 NOTE — Progress Notes (Signed)
   S:  Patient arrives in good spirits, walking without assistance. Patient arrives for evaluation/assistance with tobacco dependence.  (She has NOT yet been able to pick up a glucometer - deferred that education/review until future visit).  Patient was referred by Dr. Ky Barban on 09/07/2016.  Last PCP visit 06/04/2018.  Stated she has noticed her breathing includes "wheezing" when she lies down and more dyspnea with exertion.   Age when started using tobacco on a daily basis 56 yoa. Brand smoked [place] 100s. Number of cigarettes/day 10. Estimated nicotine content per cigarette (mg) ~ 1.  Estimated nicotine intake per day 74m.    Fagerstrom Score Question Scoring Patient Score  How soon after waking do you smoke your first cigarette? <5 mins (3) 5-30 mins (2) 31-60 mins (1) >60 mins (0) < 5 min - 3pts  Do you find it difficult NOT to smoke in places where you shouldn't? Yes(1) No (0) No  Which cigarette would you most hate to give up? First one in AM (1)  Any other one (0)      How many cigarettes do you smoke/day? 10 or less (0) 11-20 (1) 21-30 (2) >30 (3) 10 cigs - 0  Do you smoke more during the first few hours after waking? Yes (1) No (0)   Do you smoke if you are so ill you cannot get out of bed? Yes (1) No (0)    Total Score > 3  Score interpretation: low 1-2, low-to-moderate 3-4, moderate 5-7, high >7  Most recent quit attempt 2017 Longest time ever been tobacco free 620monthwhen she was highly involved in her church and "got the holy spirit"  Medications used in past cessation efforts include: NRT (patch and gum) and varenicline  Rates IMPORTANCE of quitting tobacco on 1-10 scale of 10. Rates CONFIDENCE of quitting tobacco on 1-10 scale of 1.  Most common triggers to use tobacco include; Boredom, being alone.   Motivation to quit: health/for her grandchildren ages 7 40nd 2.   Clinical ASCVD: Yes   A/P: Tobacco use disorder with mild nicotine dependence of 41  years duration in a patient who is fair candidate for success because of current level of commitment and previous success of 30m49monthuit duration.    -Initiated varenicline titration of 0.5 mg by mouth once daily with food x3 days, then 0.5 mg by mouth twice daily with food x4 days, then 1 mg by mouth twice daily with food thereafter. Patient counseled on purpose, proper use, and potential adverse effects, including GI upset, and potential change in mood.   Blood pressure elevated - noted she was having cramps in her lower legs and due to this she has been avoiding her diuretic therapy.  Still takes potassium pills daily.  Repeat BMET today.   Hyperlipidemia - reevaluated with lipid panel blood draw today. (last checked > 1 year ago).  Reports adherence with atorvastatin 73m54mily.      Blood Glucometer education/review was deferred due to her lack of a meter at this time. (Unable to afford co-pay this past month).  Written information provided.    F/U Rx Clinic Visit  1 month.  Total time in face-to-face counseling 35 minutes.

## 2018-09-20 NOTE — Assessment & Plan Note (Signed)
Tobacco use disorder with mild nicotine dependence of 41 years duration in a patient who is fair candidate for success because of current level of commitment and previous success of 23month quit duration.    -Initiated varenicline titration of 0.5 mg by mouth once daily with food x3 days, then 0.5 mg by mouth twice daily with food x4 days, then 1 mg by mouth twice daily with food thereafter. Patient counseled on purpose, proper use, and potential adverse effects, including GI upset, and potential change in mood.

## 2018-09-21 LAB — LIPID PANEL
CHOLESTEROL TOTAL: 152 mg/dL (ref 100–199)
Chol/HDL Ratio: 3.5 ratio (ref 0.0–4.4)
HDL: 43 mg/dL (ref >39)
LDL CALC: 73 mg/dL (ref 0–99)
TRIGLYCERIDES: 179 mg/dL — AB (ref 0–149)
VLDL Cholesterol Cal: 36 mg/dL (ref 5–40)

## 2018-09-21 LAB — BASIC METABOLIC PANEL
BUN/Creatinine Ratio: 13 (ref 9–23)
BUN: 17 mg/dL (ref 6–24)
CHLORIDE: 96 mmol/L (ref 96–106)
CO2: 26 mmol/L (ref 20–29)
CREATININE: 1.26 mg/dL — AB (ref 0.57–1.00)
Calcium: 9.8 mg/dL (ref 8.7–10.2)
GFR calc Af Amer: 55 mL/min/{1.73_m2} — ABNORMAL LOW (ref >59)
GFR calc non Af Amer: 48 mL/min/{1.73_m2} — ABNORMAL LOW (ref >59)
Glucose: 128 mg/dL — ABNORMAL HIGH (ref 65–99)
Potassium: 3.6 mmol/L (ref 3.5–5.2)
SODIUM: 137 mmol/L (ref 134–144)

## 2018-09-23 ENCOUNTER — Other Ambulatory Visit: Payer: Self-pay

## 2018-09-23 ENCOUNTER — Telehealth: Payer: Self-pay | Admitting: *Deleted

## 2018-09-23 ENCOUNTER — Ambulatory Visit (INDEPENDENT_AMBULATORY_CARE_PROVIDER_SITE_OTHER): Payer: Medicare HMO | Admitting: Family Medicine

## 2018-09-23 VITALS — BP 165/105 | HR 95 | Temp 97.9°F | Wt 290.0 lb

## 2018-09-23 DIAGNOSIS — I10 Essential (primary) hypertension: Secondary | ICD-10-CM

## 2018-09-23 DIAGNOSIS — B079 Viral wart, unspecified: Secondary | ICD-10-CM | POA: Insufficient documentation

## 2018-09-23 DIAGNOSIS — M5416 Radiculopathy, lumbar region: Secondary | ICD-10-CM

## 2018-09-23 NOTE — Progress Notes (Signed)
Subjective:    Hailey Barnes - 57 y.o. female MRN [id number]  Date of birth: 1961/03/01  CC: wart  HPI:  Hailey Barnes is here for a wart on her right thumb.  She has had this wart for the last four years, and it has been treated with cryotherapy and OTC wart treatment cream.  When it was frozen last time, it was very painful, and she had skin irritation surrounding the wart but no change in the wart.  In fact, the wart has seemed to become larger over time.  The bottom of her wart is tender, but it is mostly nonpainful.  Health Maintenance:  Health Maintenance Due  Topic Date Due  . URINE MICROALBUMIN  03/04/1973  . FOOT EXAM  08/21/2018    -  reports that she has been smoking cigarettes. She started smoking about 41 years ago. She has a 20.50 pack-year smoking history. She has never used smokeless tobacco. - Review of Systems: Per HPI. - Past Medical History: Patient Active Problem List   Diagnosis Date Noted  . Wart on thumb 09/23/2018  . Acute medial meniscus tear of left knee 08/16/2018  . Plica of knee, left 38/45/3646  . Chondromalacia, left knee 08/16/2018  . Primary osteoarthritis of both knees 03/24/2018  . Primary osteoarthritis of both first carpometacarpal joints 11/16/2017  . De Quervain's tenosynovitis, right 11/02/2017  . Bilateral carpal tunnel syndrome 10/21/2017  . Dysuria 09/22/2017  . High risk sexual behavior 08/27/2017  . Benign neoplasm of sigmoid colon   . Insomnia 02/17/2017  . OSA (obstructive sleep apnea) 10/30/2016  . Sinus pause 10/23/2016  . Panic attack 10/22/2016  . Lower extremity edema   . Lumbar radiculopathy 06/02/2016  . Hyperlipidemia 02/07/2016  . T2DM (type 2 diabetes mellitus) ([place]) 12/07/2015  . Occipital lymphadenopathy 09/21/2015  . Depression 04/19/2015  . COPD exacerbation ([place]) 10/06/2014  . Eczema 08/01/2014  . Diminished vision 06/23/2014  . Chronic diastolic CHF (congestive heart failure) ([place]) 06/13/2014  .  Restrictive lung disease 05/10/2014  . Vaginal discharge 03/22/2014  . NASH (nonalcoholic steatohepatitis) 01/04/2014  . Hypertension 05/26/2010  . Morbid obesity ([place]) 12/17/2006  . Tobacco abuse 12/17/2006   - Medications: reviewed and updated   Objective:   Physical Exam BP (!) 165/105   Pulse 95   Temp 97.9 F (36.6 C) (Oral)   Wt 290 lb (131.5 kg)   LMP 04/06/2017   SpO2 95%   BMI 50.57 kg/m  Gen: NAD, alert, cooperative with exam, morbidly obese Skin: Wart on palmar aspect of right thumb about 1 cm in diameter with some irritation due to skin picking Psych: good insight, alert and oriented        Assessment & Plan:   Wart on thumb Patient was advised on her options for therapy including cryotherapy, salicylic acid treatment, excision.  She elected to have an excision with salicylic acid therapy, so she was referred to a hand surgeon and shown which salicylic acid pads would be most effective for her.  She was given a handout on other remedies for wart removal and advised that unfortunately the remedies available for wart removal have varied effectiveness.  Of note, patient's blood pressure was elevated to 178/110 today, but she says that she did not take her blood pressure medications today.  After rechecking, patient's blood pressure was 165/105.  Patient was encouraged to take her hypertensive medications daily.  Maia Breslow, M.D. 09/23/2018, 2:20 PM PGY-2, [place]

## 2018-09-23 NOTE — Addendum Note (Signed)
Addended by: Andrena Mews T on: 09/23/2018 02:33 PM   Modules accepted: Level of Service

## 2018-09-23 NOTE — Patient Instructions (Addendum)
We are sending a referral to a hand surgeon to excise the wart on your thumb.  You can go ahead and use the salicylic acid patches before seeing the surgeon.  At-home wart removal  Common warts on the backs of the hands and fingers can often be treated at home. Here are seven options for at-home wart removal:  Salicylic acid  Salicylic acid may be the most effective topical wart-removal treatment. It's available over-the-counter in several forms, including as a concentrated liquid, gel, or adhesive pad. It's also available in varying strengths. Before using, talk to your doctor about the type and strength of salicylic acid you should use.  For best results, soak your wart in warm water for 10 to 15 minutes first, to soften it. Then, file away the dead skin on top using a nail file or pumice stone. Make sure to stop filing if you feel any discomfort. Next, apply the salicylic acid according to your doctor's directions, or the directions on the package.  It may take several weeks for the wart to fall off. Stop using salicylic acid if your skin becomes irritated, swollen, or painful.  Duct tape occlusion It may sound unconventional, but duct tape can be effective at removing warts on the hands and fingers. It may work by removing the wart, layer by layer, over the course of several weeks.  Place a small piece of duct tape on your wart and leave it in place for three to six days. Remove the tape and gently scrape the wart down with a nail file or pumice stone, leaving it exposed to air for around twelve hours. Reapply the duct tape and repeat this process until the wart is gone completely.  Apple cider vinegar Apple cider vinegar is a mild acid that may help to burn off the wart while attacking the virus. Create a mixture of two parts apple cider vinegar and one part water. Soak a cotton ball in the mixture and apply to the wart. Tape or bandage it in place overnight. Repeat nightly until the wart is  gone.

## 2018-09-23 NOTE — Telephone Encounter (Signed)
Left L4-L5 transforaminal epidural ordered, please contact [facility] for scheduling.

## 2018-09-23 NOTE — Telephone Encounter (Signed)
Pt left vm wanting you to place the order for to get "that shot in her back".

## 2018-09-23 NOTE — Assessment & Plan Note (Signed)
Patient was advised on her options for therapy including cryotherapy, salicylic acid treatment, excision.  She elected to have an excision with salicylic acid therapy, so she was referred to a hand surgeon and shown which salicylic acid pads would be most effective for her.  She was given a handout on other remedies for wart removal and advised that unfortunately the remedies available for wart removal have varied effectiveness.

## 2018-09-24 NOTE — Telephone Encounter (Signed)
LMOM with Roberta at GI.

## 2018-09-26 ENCOUNTER — Other Ambulatory Visit: Payer: Self-pay | Admitting: Family Medicine

## 2018-10-05 ENCOUNTER — Ambulatory Visit
Admission: RE | Admit: 2018-10-05 | Discharge: 2018-10-05 | Disposition: A | Discharge status: Home / Self Care | Payer: Medicare HMO | Source: Ambulatory Visit | Attending: Sports Medicine | Admitting: Sports Medicine

## 2018-10-05 DIAGNOSIS — M545 Low back pain: Secondary | ICD-10-CM | POA: Diagnosis not present

## 2018-10-05 MED ORDER — IOPAMIDOL (ISOVUE-M 200) INJECTION 41%
1.0000 mL | Freq: Once | INTRAMUSCULAR | Status: AC
  Administered 2018-10-05: 1 mL via EPIDURAL

## 2018-10-05 MED ORDER — METHYLPREDNISOLONE ACETATE 40 MG/ML INJ SUSP (RADIOLOG
120.0000 mg | Freq: Once | INTRAMUSCULAR | Status: AC
  Administered 2018-10-05: 120 mg via EPIDURAL

## 2018-10-07 DIAGNOSIS — Z6841 Body Mass Index (BMI) 40.0 and over, adult: Secondary | ICD-10-CM | POA: Diagnosis not present

## 2018-10-07 DIAGNOSIS — Z9889 Other specified postprocedural states: Secondary | ICD-10-CM | POA: Diagnosis not present

## 2018-10-22 ENCOUNTER — Ambulatory Visit: Payer: Medicare HMO

## 2018-10-22 ENCOUNTER — Ambulatory Visit (INDEPENDENT_AMBULATORY_CARE_PROVIDER_SITE_OTHER): Payer: Medicare HMO | Admitting: Family Medicine

## 2018-10-22 ENCOUNTER — Other Ambulatory Visit: Payer: Self-pay

## 2018-10-22 VITALS — BP 140/88 | Temp 97.6°F | Wt 288.0 lb

## 2018-10-22 DIAGNOSIS — N898 Other specified noninflammatory disorders of vagina: Secondary | ICD-10-CM

## 2018-10-22 LAB — POCT WET PREP (WET MOUNT): TRICHOMONAS WET PREP HPF POC: ABSENT

## 2018-10-22 MED ORDER — FLUCONAZOLE 150 MG PO TABS: 150.0000 mg | ORAL_TABLET | Freq: Once | ORAL | 0 refills | Status: AC

## 2018-10-22 NOTE — Progress Notes (Signed)
SUBJECTIVE:  58 y.o. female complains of clear vaginal discharge for 5 day(s) as well as itching/discomfort. Patient sexually active with ex husband. Denies abnormal vaginal bleeding or significant pelvic pain or fever. No UTI symptoms. Denies history of known exposure to STD.  Patient's last menstrual period was 04/06/2017.  OBJECTIVE:  She appears well, afebrile. Abdomen: benign, soft, nontender, no masses. Pelvic Exam: normal external genitalia, vulva, vagina, cervix, uterus and adnexa. White discharge. Urine dipstick: not done.  ASSESSMENT:  Wet prep positive for yeast and negative for clue cells or trichomonas.  PLAN:  Diflucan 150 mg once  ROV prn if symptoms persist or worsen.

## 2018-11-15 ENCOUNTER — Other Ambulatory Visit: Payer: Self-pay | Admitting: Family Medicine

## 2018-11-15 DIAGNOSIS — Z720 Tobacco use: Secondary | ICD-10-CM

## 2018-11-23 ENCOUNTER — Other Ambulatory Visit: Payer: Self-pay | Admitting: Family Medicine

## 2018-11-23 DIAGNOSIS — Z1231 Encounter for screening mammogram for malignant neoplasm of breast: Secondary | ICD-10-CM

## 2018-11-24 ENCOUNTER — Ambulatory Visit
Admission: RE | Admit: 2018-11-24 | Discharge: 2018-11-24 | Disposition: A | Discharge status: Home / Self Care | Payer: Medicare HMO | Source: Ambulatory Visit | Attending: Family Medicine | Admitting: Family Medicine

## 2018-11-24 DIAGNOSIS — Z1231 Encounter for screening mammogram for malignant neoplasm of breast: Secondary | ICD-10-CM | POA: Diagnosis not present

## 2018-12-06 ENCOUNTER — Other Ambulatory Visit: Payer: Self-pay | Admitting: Family Medicine

## 2018-12-06 NOTE — Telephone Encounter (Signed)
Please have pt make appt for diabetes. Refill provided.

## 2018-12-14 ENCOUNTER — Ambulatory Visit (INDEPENDENT_AMBULATORY_CARE_PROVIDER_SITE_OTHER): Payer: Medicare HMO | Admitting: Family Medicine

## 2018-12-14 ENCOUNTER — Ambulatory Visit: Payer: Medicare HMO | Admitting: Family Medicine

## 2018-12-14 ENCOUNTER — Other Ambulatory Visit: Payer: Self-pay

## 2018-12-14 ENCOUNTER — Encounter: Payer: Self-pay | Admitting: Family Medicine

## 2018-12-14 VITALS — BP 160/100 | HR 86 | Temp 98.2°F | Wt 294.1 lb

## 2018-12-14 DIAGNOSIS — J441 Chronic obstructive pulmonary disease with (acute) exacerbation: Secondary | ICD-10-CM | POA: Diagnosis not present

## 2018-12-14 MED ORDER — ALBUTEROL SULFATE HFA 108 (90 BASE) MCG/ACT IN AERS: 2.0000 | INHALATION_SPRAY | Freq: Four times a day (QID) | RESPIRATORY_TRACT | 5 refills | Status: DC | PRN

## 2018-12-14 MED ORDER — MOMETASONE FURO-FORMOTEROL FUM 200-5 MCG/ACT IN AERO: 2.0000 | INHALATION_SPRAY | Freq: Every day | RESPIRATORY_TRACT | 5 refills | Status: DC

## 2018-12-14 MED ORDER — IPRATROPIUM-ALBUTEROL 0.5-2.5 (3) MG/3ML IN SOLN: 3.0000 mL | Freq: Once | RESPIRATORY_TRACT | Status: DC

## 2018-12-14 MED ORDER — TIOTROPIUM BROMIDE MONOHYDRATE 18 MCG IN CAPS: 18.0000 ug | ORAL_CAPSULE | Freq: Every day | RESPIRATORY_TRACT | 5 refills | Status: DC

## 2018-12-14 MED ORDER — IPRATROPIUM BROMIDE 0.02 % IN SOLN
0.5000 mg | Freq: Once | RESPIRATORY_TRACT | Status: AC
  Administered 2018-12-14: 0.5 mg via RESPIRATORY_TRACT

## 2018-12-14 MED ORDER — ALBUTEROL SULFATE (2.5 MG/3ML) 0.083% IN NEBU
2.5000 mg | INHALATION_SOLUTION | Freq: Once | RESPIRATORY_TRACT | Status: AC
  Administered 2018-12-14: 2.5 mg via RESPIRATORY_TRACT

## 2018-12-14 NOTE — Progress Notes (Signed)
   [facility] Phone: [phone]   cc: dyspnea  Subjective:  SOB: one week.  It has been getting worse.  SOB with activity and at rest. She ran out of her controller inhalers 2-3 weeks ago because they were expired and she was told not to use them.  Has been using her albuterol rescue inhaler about twice a day since that time. Has a CPAP at night but has been Using CPAP for oxygen during the day for the past week.  She has had a cough during this time.Sometimes she coughs up thick yellowish sputum maybe twice daily.  No fevers.  The left side of her throat is dry.  She is complaining of hoarseness.  voice has gotten worse the more she has coughed.    She had chest pain yesterday but it is gone today. She thinks it is related to her cough. No headache. Nobody in the family has been sick. Isn't around a lot of people usually.  For her heart failure she takes the torsemide 2 days and then takes a day or two off bc she gets cramps.          ROS: See HPI for pertinent positives and negatives  Past Medical History  Family history reviewed for today's visit. No changes.  Social history- patient is a current smoker.  1/3 pack a day.  Smoking less today.    Objective: BP (!) 160/100 Comment: walk pluse ox: 96%-73BPM - 94%- 94 BPM  Pulse 86   Temp 98.2 F (36.8 C) (Oral)   Wt 294 lb 2 oz (133.4 kg)   LMP 04/06/2017   SpO2 98%   BMI 51.28 kg/m  XEN:MMHWK and oriented, cooperative with exam.  Appears to be  HEENT: NCAT, EOMI, MMM Neck: FROM, supple, no masses CV: normal rate, regular rhythm. No murmurs, no rubs. No JVD. No hepatojugular reflux. No pitting edema.  Resp: decreased breath sounds.  No wheezing heard. No crackles.  No tachypnea. Mildly increased work of breathing.  Skin: No rashes, no lesions Psych: Appropriate behavior  Assessment/Plan: COPD exacerbation ([place]) Patient with known COPD and CHF has been not taking controller medication for at least two  weeks comes in with increased dyspnea on exertion and at rest.  She reports she can hear wheezing sometimes but does not have wheezing on exam today.  Breath sounds diminished on exam. Patient dypsneic on exertion but has saturations above 94% even while ambulating. Favors COPD exacerbation of CHF exacerbation given history of not taking inhalers, and lack of signs on physical exam indicating heart failure (no crackles, no edema, no JVD, no hepatojugular refulx).  Patient was given duoneb treatment and stated this helped a little.  Do not believe there is an underlying infectious process given normal temp, lack of hypoxia, minimal sputum production, so no antibiotics prescribed.  No steroids given due to decreased severity of symptoms as well as concern for CHF exacerbation.    - refill dulera, spiriva, and albuterol  - f/u in [facility] clinic in 4 days.    Clemetine Marker, MD PGY-1

## 2018-12-14 NOTE — Assessment & Plan Note (Addendum)
Patient with known COPD and CHF has been not taking controller medication for at least two weeks comes in with increased dyspnea on exertion and at rest.  She reports she can hear wheezing sometimes but does not have wheezing on exam today.  Breath sounds diminished on exam. Patient dypsneic on exertion but has saturations above 94% even while ambulating. Favors COPD exacerbation of CHF exacerbation given history of not taking inhalers, and lack of signs on physical exam indicating heart failure (no crackles, no edema, no JVD, no hepatojugular refulx).  Patient was given duoneb treatment and stated this helped a little.  Do not believe there is an underlying infectious process given normal temp, lack of hypoxia, minimal sputum production, so no antibiotics prescribed.  No steroids given due to decreased severity of symptoms as well as concern for CHF exacerbation.    - refill dulera, spiriva, and albuterol  - f/u in [facility] clinic in 4 days.

## 2018-12-14 NOTE — Patient Instructions (Addendum)
I believe you are having a COPD exacerbation due to not taking your prescribed inhalers for the past few weeks.  I have given you a prescription for refills on your inhalers that you can take to the health department.  Because this is a minor exacerbation I have held of on starting steroids.  I have made an appointment for you to come back on Friday, the 28th of February.  If your breathing starts to feel worse before that I would try to get a same day appointment with [place] or go to the emergency department if you cannot get an appointment with [place] that day.    Clemetine Marker, MD

## 2018-12-15 ENCOUNTER — Ambulatory Visit: Payer: Medicare HMO

## 2018-12-15 ENCOUNTER — Telehealth: Payer: Self-pay | Admitting: *Deleted

## 2018-12-15 NOTE — Telephone Encounter (Signed)
Received message from [facility], Dulera and spiriva are not covered meds per pt insurance.  Alternatives are:  Dulera: Wixela, fluticasone/salmeterol, Advair Diskus, Symbicort or Breo  Spiriva: Spiriva Resprimat, incruse ellipta  Will forward to MD who saw her today. Sherelle Castelli, Salome Spotted, CMA

## 2018-12-17 ENCOUNTER — Ambulatory Visit (INDEPENDENT_AMBULATORY_CARE_PROVIDER_SITE_OTHER): Payer: Medicare HMO | Admitting: Family Medicine

## 2018-12-17 ENCOUNTER — Other Ambulatory Visit: Payer: Self-pay

## 2018-12-17 VITALS — BP 144/62 | HR 85 | Temp 98.2°F | Wt 295.0 lb

## 2018-12-17 DIAGNOSIS — E119 Type 2 diabetes mellitus without complications: Secondary | ICD-10-CM

## 2018-12-17 DIAGNOSIS — I5032 Chronic diastolic (congestive) heart failure: Secondary | ICD-10-CM

## 2018-12-17 DIAGNOSIS — J441 Chronic obstructive pulmonary disease with (acute) exacerbation: Secondary | ICD-10-CM | POA: Diagnosis not present

## 2018-12-17 MED ORDER — PREDNISONE 50 MG PO TABS: 50.0000 mg | ORAL_TABLET | Freq: Every day | ORAL | 0 refills | Status: DC

## 2018-12-17 MED ORDER — FLUTICASONE FUROATE-VILANTEROL 200-25 MCG/INH IN AEPB: 1.0000 | INHALATION_SPRAY | Freq: Every day | RESPIRATORY_TRACT | 5 refills | Status: DC

## 2018-12-17 NOTE — Patient Instructions (Signed)
It was nice meeting you today Hailey Barnes!  I am sending in a 3-day course of prednisone and Breo, which will become your new controller medication in place of Dulera.  Please continue to take albuterol as needed and Spiriva once a day as you have been.  You should also take Breo once daily.  We are checking your potassium level and your hemoglobin A1c today.  Please start taking metformin twice daily, since this will help improve your A1c.  Please also focus on eating more fruits and vegetables and less processed foods to help with weight loss.  Whenever you feel better, please try to get in about 30 minutes of physical activity per day.  Please continue to work on your smoking.  If you quit, you will have fewer of these COPD exacerbations.  If you have any questions or concerns, please feel free to call the clinic.   Be well,  Dr. Shan Levans

## 2018-12-17 NOTE — Progress Notes (Signed)
Subjective:    Hailey Barnes - 58 y.o. female MRN [id number]  Date of birth: 1960-11-05  CC:  Hailey Barnes is here for follow up of COPD exacerbation.  HPI: COPD exacerbation follow-up -Overall feels better than she did at her previous appointment on 2/25, but is not back to her baseline - feels tired and feels like her she struggles to get air in - she is fatigued by daily activities - feels like steroids may be helpful - usually smokes a little under one pack per day, but has not smoked much in the past few days - was able to refill albuterol and spiriva, but her insurance did not cover Dulera - finds CPAP helpful to assist in her inspiration - has been using albuterol twice daily over the last few days and has used Spiriva as prescribed -Does not report worsening orthopnea, saying that she has had to sleep in at an incline for several years, but does say that she thinks she has had some swelling in her abdomen due to extra fluid -Continues to take torsemide 60 mg/day  Health Maintenance:  Health Maintenance Due  Topic Date Due  . URINE MICROALBUMIN  03/04/1973  . FOOT EXAM  08/21/2018  . OPHTHALMOLOGY EXAM  11/05/2018    -  reports that she has been smoking cigarettes. She started smoking about 42 years ago. She has a 20.50 pack-year smoking history. She has never used smokeless tobacco. - Review of Systems: Per HPI. - Past Medical History: Patient Active Problem List   Diagnosis Date Noted  . Wart on thumb 09/23/2018  . Acute medial meniscus tear of left knee 08/16/2018  . Plica of knee, left 93/90/3009  . Chondromalacia, left knee 08/16/2018  . Primary osteoarthritis of both knees 03/24/2018  . Primary osteoarthritis of both first carpometacarpal joints 11/16/2017  . De Quervain's tenosynovitis, right 11/02/2017  . Bilateral carpal tunnel syndrome 10/21/2017  . Dysuria 09/22/2017  . High risk sexual behavior 08/27/2017  . Benign neoplasm of sigmoid  colon   . Insomnia 02/17/2017  . OSA (obstructive sleep apnea) 10/30/2016  . Sinus pause 10/23/2016  . Panic attack 10/22/2016  . Lower extremity edema   . Lumbar radiculopathy 06/02/2016  . Hyperlipidemia 02/07/2016  . T2DM (type 2 diabetes mellitus) ([place]) 12/07/2015  . Occipital lymphadenopathy 09/21/2015  . Depression 04/19/2015  . COPD exacerbation ([place]) 10/06/2014  . Eczema 08/01/2014  . Diminished vision 06/23/2014  . Chronic diastolic CHF (congestive heart failure) ([place]) 06/13/2014  . Restrictive lung disease 05/10/2014  . Vaginal discharge 03/22/2014  . NASH (nonalcoholic steatohepatitis) 01/04/2014  . Hypertension 05/26/2010  . Morbid obesity ([place]) 12/17/2006  . Tobacco abuse 12/17/2006   - Medications: reviewed and updated   Objective:   Physical Exam BP (!) 144/62   Pulse 85   Temp 98.2 F (36.8 C) (Oral)   Wt 295 lb (133.8 kg)   LMP 04/06/2017   SpO2 96%   BMI 51.44 kg/m  Gen: NAD, alert, cooperative with exam, morbidly obese CV: RRR, good S1/S2, no murmur, no pedal edema Resp: No rhonchi or wheezing, but reduced air movement in all lung fields.  No visible shortness of breath. Psych: good insight, alert and oriented    Assessment & Plan:   COPD exacerbation ([place]) Patient was prescribed Breo since [facility] is no longer covered by her insurance.  Patient was instructed to take 1 puff each day of Breo.  Patient was also prescribed a 3-day course of prednisone 50  mg once daily, since she thinks that steroids would benefit her breathing.  She was counseled on the importance of smoking cessation and weight loss, since her smoking and morbid obesity are causing obstructive and restrictive lung diseases respectively.  Patient was given return precautions, including increased shortness of breath, increased cough, and development of fever.  Chronic diastolic CHF (congestive heart failure) ([facility]) Will obtain BMP to check potassium level given her use of torsemide 60  mg/day.  T2DM (type 2 diabetes mellitus) ([place]) Patient is due for an A1c, so this was done today.  She was counseled to use metformin twice daily and to eat more fruits and vegetables and less processed foods to control her diabetes and obesity.    Maia Breslow, M.D. 12/17/2018, 1:57 PM PGY-2, [place]

## 2018-12-17 NOTE — Assessment & Plan Note (Signed)
Patient was prescribed Breo since Hailey Barnes is no longer covered by her insurance.  Patient was instructed to take 1 puff each day of Breo.  Patient was also prescribed a 3-day course of prednisone 50 mg once daily, since she thinks that steroids would benefit her breathing.  She was counseled on the importance of smoking cessation and weight loss, since her smoking and morbid obesity are causing obstructive and restrictive lung diseases respectively.  Patient was given return precautions, including increased shortness of breath, increased cough, and development of fever.

## 2018-12-17 NOTE — Assessment & Plan Note (Signed)
Will obtain BMP to check potassium level given her use of torsemide 60 mg/day.

## 2018-12-17 NOTE — Assessment & Plan Note (Signed)
Patient is due for an A1c, so this was done today.  She was counseled to use metformin twice daily and to eat more fruits and vegetables and less processed foods to control her diabetes and obesity.

## 2018-12-18 LAB — BASIC METABOLIC PANEL
BUN/Creatinine Ratio: 13 (ref 9–23)
BUN: 14 mg/dL (ref 6–24)
CO2: 26 mmol/L (ref 20–29)
Calcium: 9.5 mg/dL (ref 8.7–10.2)
Chloride: 99 mmol/L (ref 96–106)
Creatinine, Ser: 1.07 mg/dL — ABNORMAL HIGH (ref 0.57–1.00)
GFR calc Af Amer: 68 mL/min/{1.73_m2} (ref >59)
GFR calc non Af Amer: 59 mL/min/{1.73_m2} — ABNORMAL LOW (ref >59)
Glucose: 113 mg/dL — ABNORMAL HIGH (ref 65–99)
Potassium: 3.9 mmol/L (ref 3.5–5.2)
Sodium: 140 mmol/L (ref 134–144)

## 2018-12-18 LAB — HEMOGLOBIN A1C
Est. average glucose Bld gHb Est-mCnc: 171 mg/dL
Hgb A1c MFr Bld: 7.6 % — ABNORMAL HIGH (ref 4.8–5.6)

## 2018-12-21 NOTE — Telephone Encounter (Signed)
Pt was Rx'd Breo @ appt. Dasan Hardman, Salome Spotted, CMA

## 2018-12-29 DIAGNOSIS — R079 Chest pain, unspecified: Secondary | ICD-10-CM | POA: Diagnosis not present

## 2018-12-29 DIAGNOSIS — G4733 Obstructive sleep apnea (adult) (pediatric): Secondary | ICD-10-CM | POA: Diagnosis not present

## 2018-12-29 DIAGNOSIS — E119 Type 2 diabetes mellitus without complications: Secondary | ICD-10-CM | POA: Diagnosis not present

## 2018-12-31 ENCOUNTER — Other Ambulatory Visit: Payer: Self-pay | Admitting: Family Medicine

## 2019-01-01 NOTE — Telephone Encounter (Signed)
Refills provided. Please have patient make appointment to establish with new PCP.

## 2019-01-04 DIAGNOSIS — M170 Bilateral primary osteoarthritis of knee: Secondary | ICD-10-CM | POA: Diagnosis not present

## 2019-01-04 DIAGNOSIS — M25562 Pain in left knee: Secondary | ICD-10-CM | POA: Diagnosis not present

## 2019-01-04 DIAGNOSIS — M25561 Pain in right knee: Secondary | ICD-10-CM | POA: Diagnosis not present

## 2019-01-04 DIAGNOSIS — M67911 Unspecified disorder of synovium and tendon, right shoulder: Secondary | ICD-10-CM | POA: Diagnosis not present

## 2019-01-04 DIAGNOSIS — M19011 Primary osteoarthritis, right shoulder: Secondary | ICD-10-CM | POA: Diagnosis not present

## 2019-01-04 DIAGNOSIS — M1711 Unilateral primary osteoarthritis, right knee: Secondary | ICD-10-CM | POA: Diagnosis not present

## 2019-01-04 DIAGNOSIS — M1712 Unilateral primary osteoarthritis, left knee: Secondary | ICD-10-CM | POA: Diagnosis not present

## 2019-01-24 ENCOUNTER — Other Ambulatory Visit: Payer: Self-pay | Admitting: Family Medicine

## 2019-01-26 MED ORDER — POTASSIUM CHLORIDE CRYS ER 20 MEQ PO TBCR: 20.0000 meq | EXTENDED_RELEASE_TABLET | Freq: Every day | ORAL | 0 refills | Status: DC

## 2019-01-26 NOTE — Addendum Note (Signed)
Addended by: Christen Bame D on: 01/26/2019 10:02 AM   Modules accepted: Orders

## 2019-01-26 NOTE — Telephone Encounter (Signed)
Transmission failed. Resent. Christen Bame, CMA

## 2019-02-24 ENCOUNTER — Other Ambulatory Visit: Payer: Self-pay

## 2019-02-24 ENCOUNTER — Telehealth (INDEPENDENT_AMBULATORY_CARE_PROVIDER_SITE_OTHER): Payer: Medicare HMO | Admitting: Family Medicine

## 2019-02-24 DIAGNOSIS — J392 Other diseases of pharynx: Secondary | ICD-10-CM

## 2019-02-24 DIAGNOSIS — N760 Acute vaginitis: Secondary | ICD-10-CM | POA: Diagnosis not present

## 2019-02-24 MED ORDER — FLUCONAZOLE 150 MG PO TABS: 150.0000 mg | ORAL_TABLET | Freq: Once | ORAL | 1 refills | Status: AC

## 2019-02-24 NOTE — Progress Notes (Signed)
  [facility] Telemedicine Visit  Patient consented to have virtual visit. Method of visit: Video  Encounter participants: Patient: Hailey Barnes - located at home Provider: Steve Rattler - located at [facility] Others (if applicable): none  Chief Complaint: sore throat and vaginal irritation  HPI: Per patient she was sexually active "all weekend" with her ex-husband. She reports "mostly oral sex" and afterwards she had itching and irritation of her vagina and throat. She reports she has used monistat twice with little improvement of her vaginal irritation. She is scratching quite a bit and is very uncomfortable.  She states she planned to gargle with hydrogen peroxide but has not gotten to store to get this yet. She states she noticed some white film on her tongue. She states her throat is very dry and she is drinking a lot of water to help with this sensation. She reports her and her ex husband are still sexually active with only each other and she has little concern for any STD exposure.   She reports she has DM but last A1C was 7.6 in February. She has no way to monitor sugars at home.  ROS: per HPI, additionally, no cough, congestion, fever, chills  Pertinent PMHx: T2DM  Exam:  Gen: well appearing HEENT: tongue appears normal Respiratory: no distress  Assessment/Plan:  1. Acute vaginitis Presumptively yeast related, unable to examine patient. Will treat with diflucan x 1 with refill to repeat if not improved in 3 days. If no improvement advised patient to come in after weekend for in person assessment, she is agreeable.   2. Dry throat Unable to see throat on video call, per patient she has dry scratchy throat, no other URI symptoms. She denies seasonal allergies. She reports concern for thrush and states she saw white film on tongue, I do not see this on video call today. Treating with diflucan as above for yeast vaginitis. Discussed that if no  improvement to come in Monday for in person assessment. She is agreeable.  Time spent during visit with patient: 10 minutes  Lucila Maine, DO PGY-3, [facility] 02/24/2019 9:28 AM

## 2019-02-28 ENCOUNTER — Telehealth: Payer: Self-pay

## 2019-02-28 NOTE — Telephone Encounter (Signed)
Pt calls nurse line stating she had a telemed visit with Riccio for a yeast infection last week. Pt stated she was given one diflucan and hasn't felt much relief. Pt stated Vanetta Shawl would send in another pill if the first one didn't work. Please advise.

## 2019-03-01 NOTE — Telephone Encounter (Signed)
Please have her come in to be seen- I told her if the yeast pill did not work we would have to see her fpr an in person exam and swab her to see what was going on.

## 2019-03-02 ENCOUNTER — Ambulatory Visit (INDEPENDENT_AMBULATORY_CARE_PROVIDER_SITE_OTHER): Payer: Medicare HMO | Admitting: Family Medicine

## 2019-03-02 ENCOUNTER — Other Ambulatory Visit: Payer: Self-pay

## 2019-03-02 ENCOUNTER — Other Ambulatory Visit (HOSPITAL_COMMUNITY)
Admission: RE | Admit: 2019-03-02 | Discharge: 2019-03-02 | Disposition: A | Discharge status: Home / Self Care | Payer: Medicare HMO | Source: Ambulatory Visit | Attending: Family Medicine | Admitting: Family Medicine

## 2019-03-02 VITALS — BP 142/90 | HR 87 | Wt 282.2 lb

## 2019-03-02 DIAGNOSIS — B370 Candidal stomatitis: Secondary | ICD-10-CM

## 2019-03-02 DIAGNOSIS — N898 Other specified noninflammatory disorders of vagina: Secondary | ICD-10-CM | POA: Diagnosis not present

## 2019-03-02 LAB — POCT WET PREP (WET MOUNT)
Clue Cells Wet Prep Whiff POC: NEGATIVE
Trichomonas Wet Prep HPF POC: ABSENT

## 2019-03-02 MED ORDER — NYSTATIN 100000 UNIT/ML MT SUSP: 200000.0000 [IU] | Freq: Four times a day (QID) | OROMUCOSAL | 1 refills | Status: DC

## 2019-03-02 MED ORDER — TRIAMCINOLONE ACETONIDE 0.025 % EX OINT: 1.0000 "application " | TOPICAL_OINTMENT | Freq: Two times a day (BID) | CUTANEOUS | 0 refills | Status: DC

## 2019-03-02 NOTE — Patient Instructions (Signed)
  Good to see you today! Please use mouthwash 4 times a day for the next 7 days  You can use the cream on the irritated vaginal area twice a day as needed.   We'll let you know the results of your gonorrhea/chlamydia test in a few days.  We'll see you on 5/26 at 1:30 with Dr. Ky Barban to talk more.  If you have questions or concerns please do not hesitate to call at [phone].  Lucila Maine, DO PGY-3, [facility] 03/02/2019 2:21 PM

## 2019-03-02 NOTE — Progress Notes (Signed)
    Subjective:    Patient ID: Hailey Barnes, female    DOB: 1961/06/27, 58 y.o.   MRN: [id number]   CC: vaginal discharge  HPI: patient called in for telephone visit last week c/o vaginal irritation and throat irritation. Treated with diflucan without relief.  She states today she still has vaginal irritation and itching. She denies dysuria or frequency/urgency. No pelvic pain. No fevers or chills. She states she will be irritated after wiping especially and her vagina will itch and hurt after wiping.   Her throat discomfort has stopped but she reports persistent dry mouth and that her tongue appears white. She states her mouth stays dry and she has to drink a lot of water to relieve the sensation of dry mouth. She therefore urinates a lot. No increased hunger.  Smoking status reviewed - current smoker  Review of Systems- see HPI, additionally, no sore throat or difficulty swallowing   Objective:  BP (!) 142/90   Pulse 87   Wt 282 lb 4 oz (128 kg)   LMP 04/06/2017   SpO2 96%   BMI 49.21 kg/m  Vitals and nursing note reviewed  General: well nourished, in no acute distress HEENT: normocephalic, tongue with white coating, MMM. No exudate or erythema in posterior oropharynx Cardiac: regular rate Respiratory: no increased work of breathing Abdomen: obese abdomen, soft GU: normal external female genitalia, cervix pink, no CMT Extremities: no edema or cyanosis.  Neuro: alert and oriented, no focal deficits   Assessment & Plan:    1. Vaginal discharge Wet prep normal. Gc/chlamydia sent to lab. Recurrent irritation may be related to DM. Vagina looks normal, no irritated tissue on exam. Will give rx for steroid ointment to use as needed for irritation. appt made w/ PCP to follow up on DM.  - Cervicovaginal ancillary only - POCT Wet Prep [facility])  2. Thrush Oral nystatin sent to pharmacy, again may be related to poorly controlled DM. Patient endorsed dry mouth. Also on  breo ellipta which could cause oral thrush. Would anticipate improvement with nystatin mouthwash, follow up w/ PCP in 1 week.   Return in about 1 week (around 03/09/2019).   Lucila Maine, DO Family Medicine Resident PGY-3

## 2019-03-03 LAB — CERVICOVAGINAL ANCILLARY ONLY
Chlamydia: NEGATIVE
Neisseria Gonorrhea: NEGATIVE

## 2019-03-04 DIAGNOSIS — I10 Essential (primary) hypertension: Secondary | ICD-10-CM | POA: Diagnosis not present

## 2019-03-04 DIAGNOSIS — E119 Type 2 diabetes mellitus without complications: Secondary | ICD-10-CM | POA: Diagnosis not present

## 2019-03-04 DIAGNOSIS — H5213 Myopia, bilateral: Secondary | ICD-10-CM | POA: Diagnosis not present

## 2019-03-04 DIAGNOSIS — H524 Presbyopia: Secondary | ICD-10-CM | POA: Diagnosis not present

## 2019-03-04 DIAGNOSIS — Z7984 Long term (current) use of oral hypoglycemic drugs: Secondary | ICD-10-CM | POA: Diagnosis not present

## 2019-03-04 DIAGNOSIS — Z0100 Encounter for examination of eyes and vision without abnormal findings: Secondary | ICD-10-CM | POA: Diagnosis not present

## 2019-03-04 DIAGNOSIS — H52222 Regular astigmatism, left eye: Secondary | ICD-10-CM | POA: Diagnosis not present

## 2019-03-04 LAB — HM DIABETES EYE EXAM

## 2019-03-07 NOTE — Progress Notes (Signed)
Please let Hailey Barnes know gc/chlamydia test negative.

## 2019-03-15 ENCOUNTER — Ambulatory Visit (INDEPENDENT_AMBULATORY_CARE_PROVIDER_SITE_OTHER): Payer: Medicare HMO | Admitting: Family Medicine

## 2019-03-15 ENCOUNTER — Other Ambulatory Visit: Payer: Self-pay

## 2019-03-15 ENCOUNTER — Encounter: Payer: Self-pay | Admitting: Family Medicine

## 2019-03-15 ENCOUNTER — Other Ambulatory Visit: Payer: Self-pay | Admitting: Family Medicine

## 2019-03-15 VITALS — BP 132/78 | HR 88 | Wt 275.0 lb

## 2019-03-15 DIAGNOSIS — R630 Anorexia: Secondary | ICD-10-CM | POA: Diagnosis not present

## 2019-03-15 DIAGNOSIS — R300 Dysuria: Secondary | ICD-10-CM

## 2019-03-15 DIAGNOSIS — E119 Type 2 diabetes mellitus without complications: Secondary | ICD-10-CM

## 2019-03-15 LAB — POCT GLYCOSYLATED HEMOGLOBIN (HGB A1C): HbA1c, POC (controlled diabetic range): 14.5 % — AB (ref 0.0–7.0)

## 2019-03-15 LAB — POCT URINALYSIS DIP (MANUAL ENTRY)
Bilirubin, UA: NEGATIVE
Glucose, UA: 500 mg/dL — AB
Leukocytes, UA: NEGATIVE
Nitrite, UA: NEGATIVE
Protein Ur, POC: NEGATIVE mg/dL
Spec Grav, UA: <=1.005 — AB
Urobilinogen, UA: 0.2 U/dL
pH, UA: 5.5

## 2019-03-15 MED ORDER — EMPAGLIFLOZIN 10 MG PO TABS: 10.0000 mg | ORAL_TABLET | Freq: Every day | ORAL | 3 refills | Status: DC

## 2019-03-15 NOTE — Assessment & Plan Note (Signed)
Vaginal irritation and dysuria improved slightly since starting nystatin powder though dysuria continues. Previously with normal vulvar, vaginal exams and negative GC/CT and wet prep. Likely related to poor diabetic control given worsened A1c but will obtain U/A and urine culture to ensure no infection. No systemic signs of infection. Return precautions discussed.

## 2019-03-15 NOTE — Assessment & Plan Note (Addendum)
Worsened, A1c 14.5 today. Likely contributing to vaginal irritation, polyuria, decreased appetite. Will start Jardiance 49m. Foot exam completed today, with decreased sensation noted to bilateral heels, otherwise wnl. Will obtain records from most recent eye exam. Counseling regarding diet and exercise provided. Follow up in 3 months.

## 2019-03-15 NOTE — Progress Notes (Signed)
Subjective:   Patient ID: Hailey Barnes    DOB: 11-13-1960, 58 y.o. female   MRN: [id number]  Hailey Barnes is a 58 y.o. female with a history of HTN, chronic diastolic CHF, OSA, NASH, W0JW, tobacco use, depression here for   Diabetes, Type 2 - Last A1c 7.6 12/17/18 - Medications: metformin 108m BID - Compliance: sometimes only once per day. - Checking BG at home: no - Diet: eating once per day, eats a lot of fruit - Exercise: none, walks in the house.  - Eye exam: a week ago, Dr. MEinar Gip(stated R eye got a lot worse in a year's time) - Foot exam: due - Microalbumin: N/A - Statin: yes - Denies symptoms of hypoglycemia, numbness extremities, foot ulcers/trauma - endorses polydipsia, polyuria  Vaginal Irritation, dysuria - seen previously for telephone visit as well as in person visit on 5/13. No vaginal or vulvar abnormalities appreciated on exam. Wet prep, GC/CT testing negative. - received steroid cream, but broke out so she quit using it - has been using nystatin powder which is helping. - No abnl bleeding or blood in urine. Slight vaginal discharge. Tingles when she pees.  - been going on 3 weeks total, getting better now but still present - no fevers, chills - has had a few days of R groin pain - postmenopausal. - no N/V/D - increased urinary urgency and frequency  Lack of appetite Reports having issues of fatty foods "running through her" ever since she had her gall bladder removed. She also reports having lack of appetite for about 2-3 weeks and drinking lots of water. She denies fevers, heartburn, chest or abdominal pain, blood in urine or stool, abnormal lumps or bumps. She is UTD on cancer screenings. No N/V/D.  Review of Systems:  Per HPI.  [place] and smoking status [place].  Objective:   BP 132/78   Pulse 88   Wt 275 lb (124.7 kg)   LMP 04/06/2017   SpO2 96%   BMI 47.95 kg/m  Vitals and nursing note reviewed.  General: obese  female, in no acute distress with non-toxic appearance CV: regular rate and rhythm without murmurs, rubs, or gallops Lungs: clear to auscultation bilaterally with normal work of breathing Abdomen: soft, non-tender, normoactive bowel sounds Skin: warm, dry, no rashes or lesions Extremities: warm and well perfused, normal tone. Foot exam performed with slight decreased sensation to bilateral heels. MSK: ROM grossly intact, strength intact, gait normal Neuro: Alert and oriented, speech normal  Assessment & Plan:   T2DM (type 2 diabetes mellitus) (HCC) Worsened, A1c 14.5 today. Likely contributing to vaginal irritation, polyuria, decreased appetite. Will start Jardiance 122m Foot exam completed today, with decreased sensation noted to bilateral heels, otherwise wnl. Will obtain records from most recent eye exam. Counseling regarding diet and exercise provided. Follow up in 3 months.  Dysuria Vaginal irritation and dysuria improved slightly since starting nystatin powder though dysuria continues. Previously with normal vulvar, vaginal exams and negative GC/CT and wet prep. Likely related to poor diabetic control given worsened A1c but will obtain U/A and urine culture to ensure no infection. No systemic signs of infection. Return precautions discussed.  Lack of appetite Unclear etiology. Acute for the past 3-4 weeks with resultant decreased PO intake. Also noted 20lb weight loss in the last 3 months. Ddx includes cancer although she is UTD on cancer screenings with no red flags on history to point to possible etiology. She also has a h/o depression without a recent  PHQ-9 so could be contributing. As diabetes is markedly uncontrolled and worsened from prior, could also be contributing. Initiated new diabetic medication today. Will have her follow up in 1 month, will do depression screening at that time. Return and emergency precautions provided.  Orders Placed This Encounter  Procedures  . Urine  Culture  . HgB A1c  . POCT urinalysis dipstick   Meds ordered this encounter  Medications  . empagliflozin (JARDIANCE) 10 MG TABS tablet    Sig: Take 10 mg by mouth daily.    Dispense:  90 tablet    Refill:  [place], DO PGY-2, [facility] 03/15/2019 4:06 PM

## 2019-03-15 NOTE — Patient Instructions (Signed)
It was great to see you!  Our plans for today:  - See below for recommendations on a diabetic diet. Try to limit the amount of fruit you eat as this has a lot of sugar content.  - Try to walk for at least 5-10 minutes each day after your largest meal of the day. - We are starting a new medication, called Jardiance. Take this once daily. - Remember to take your metformin TWICE daily. You may have to set a reminder on your phone. - Continue to use the nystatin powder for your vaginal irritation. Once we get your blood sugars better controlled, your vaginal irritation will likely get better. - One of the best things you can do for your health is to quit smoking!   We are checking some labs today, we will call you or send you a letter if they are abnormal.   Take care and seek immediate care sooner if you develop any concerns.   Dr. [facility]   Diet Recommendations for Diabetes   1. Eat at least 3 meals and 1-2 snacks per day. Never go more than 4-5 hours while awake without eating. Eat breakfast within the first hour of getting up.   2. Limit starchy foods to TWO per meal and ONE per snack. ONE portion of a starchy  food is equal to the following:   - ONE slice of bread (or its equivalent, such as half of a hamburger bun).   - 1/2 cup of a "scoopable" starchy food such as potatoes or rice.   - 15 grams of Total Carbohydrate as shown on food label.  3. Include at every meal: a protein food, a carb food, and vegetables and/or fruit.   - Obtain twice the volume of vegetables as protein or carbohydrate foods for both lunch and dinner.   - Fresh or frozen vegetables are best.   - Keep frozen vegetables on hand for a quick vegetable serving.       Starchy (carb) foods: Bread, rice, pasta, potatoes, corn, cereal, grits, crackers, bagels, muffins, all baked goods.  (Fruits, milk, and yogurt also have carbohydrate, but most of these foods will not spike your blood sugar as most  starchy foods will.)  A few fruits do cause high blood sugars; use small portions of bananas (limit to 1/2 at a time), grapes, watermelon, oranges, and most tropical fruits.    Protein foods: Meat, fish, poultry, eggs, dairy foods, and beans such as pinto and kidney beans (beans also provide carbohydrate).      Here is an example of what a healthy plate looks like:    ? Make half your plate fruits and vegetables.     ? Focus on whole fruits.     ? Vary your veggies.  ? Make half your grains whole grains. -     ? Look for the word "whole" at the beginning of the ingredients list    ? Some whole-grain ingredients include whole oats, whole-wheat flour,        whole-grain corn, whole-grain brown rice, and whole rye.  ? Move to low-fat and fat-free milk or yogurt.  ? Vary your protein routine. - Meat, fish, poultry (chicken, [place]), eggs, beans (kidney, pinto), dairy.  ? Drink and eat less sodium, saturated fat, and added sugars.

## 2019-03-15 NOTE — Assessment & Plan Note (Signed)
Unclear etiology. Acute for the past 3-4 weeks with resultant decreased PO intake. Also noted 20lb weight loss in the last 3 months. Ddx includes cancer although she is UTD on cancer screenings with no red flags on history to point to possible etiology. She also has a h/o depression without a recent PHQ-9 so could be contributing. As diabetes is markedly uncontrolled and worsened from prior, could also be contributing. Initiated new diabetic medication today. Will have her follow up in 1 month, will do depression screening at that time. Return and emergency precautions provided.

## 2019-03-17 LAB — URINE CULTURE

## 2019-03-22 ENCOUNTER — Telehealth: Payer: Self-pay | Admitting: Cardiology

## 2019-03-22 NOTE — Progress Notes (Signed)
Virtual Visit via Video Note   This visit type was conducted due to national recommendations for restrictions regarding the COVID-19 Pandemic (e.g. social distancing) in an effort to limit this patient's exposure and mitigate transmission in our community.  Due to her co-morbid illnesses, this patient is at least at moderate risk for complications without adequate follow up.  This format is felt to be most appropriate for this patient at this time.  All issues noted in this document were discussed and addressed.  A limited physical exam was performed with this format.  Please refer to the patient's chart for her consent to telehealth for [facility].   Date:  03/24/2019   ID:  Hailey Barnes, Hailey Barnes [birth year], MRN [id number]  Patient Location: Home Provider Location: Office  PCP:  [name], DO  Cardiologist:  Candee Furbish, MD  Electrophysiologist:  None   Evaluation Performed:  Follow-Up Visit  Chief Complaint:  Chest burning.  History of Present Illness:    Hailey Barnes is a 58 y.o. female with hx as below.  January 2018 for chest discomfort dyspnea chronic diastolic heart failure with normal ejection fraction 70% hypertension COPD morbid obesity type 2 diabetes history of stroke and tobacco use.  At that time, she was going to undergo a fibroid removal.  Her weight in the hospital in January 2018 was 319 pounds.  Chest pain was very atypical, musculoskeletal improved with massaging her chest.  She did have sinus bradycardia at times especially at night with a pause of 4.4 seconds and her beta-blocker carvedilol 25 mg twice a day was discontinued at that time.  Heart rate improved.  Continues to take torsemide.  This helps her with her edema.  She ended up doing quite well for her fibroid removal surgery and she also had a cholecystectomy as well and did well under anesthesia.  She still is having some cramping however postop.  Prior visit she was  contemplating gastric sleeve.  Wants to lose weight in order to either qualify for knee replacement or to rid herself of knee pain.  Today she told me her diabetes was significantly high HgB A1c of 14  With wt loss, fatigue yeast infection.  She is now on jardience.  She continues to smoke 1/2 PPD but wants to stop - we discussed cutting back 1 cigarette a month. She will try and decrease.  She is exercising on stationary bike 30 min per day, congratulated.    She is having chest pain/ heart burn.  During the day and tums helps some.  At night she has it and brash comes up into throat and nose.  Not with exercise.   With normal cath in 2015 doubt CAD but with diabetes it is a concern.    She does wear a mask into stores but mostly stays at home.     The patient does not have symptoms concerning for COVID-19 infection (fever, chills, cough, or new shortness of breath).    Past Medical History:  Diagnosis Date  . Arthritis    "knees" (02/21/2014), back  . CHF (congestive heart failure) ([place])   . Chronic bronchitis ([place])    "get it q yr" (02/21/2014)  . Chronic diastolic CHF (congestive heart failure) ([place])    a. Echo 10/23/2016: EF 75 %  . Chronic lower back pain   . Complication of anesthesia    "I don't come out well; I chew on my tongue"  . COPD (chronic obstructive pulmonary disease) (  [place])   . Diabetes mellitus without complication ([place])    TYPE 2  . Family history of adverse reaction to anesthesia    BROTHER PONV, had a blood clot several days later and heart stopped  . Fatty liver    NONALCOLIC  . Fibroid   . Heart murmur   . Hypertension   . Infection of right eye    current dx on Saturday 7/15 - using drops  . Leg swelling    bilateral  . Migraine 1982-2009  . MVP (mitral valve prolapse)   . Neuromuscular disorder ([place])    right leg nerve pain  . Shortness of breath    WITH EXERTION  . Sinus pause    a. noted on telemetry during admission from 10/2016, lasting up to 4.4  seconds. BB discontinued.   . Sleep apnea 02/2016   CPAP 16 TO 20  . Stroke [facility]) noted on CAT 02/2014   "light", LLE weakness remains (03/30/2014)  . 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 Tingling in extremities 12/12/2010   Uric acid and electrolytes (02/23) normal but WBC elevated.   D/Dx: carpal tunnel, ulnar neuropathy Less likely: cervical radiculopathy, vasculitis    Past Surgical History:  Procedure Laterality Date  . [place]  . CHOLECYSTECTOMY N/A 06/17/2017   Procedure: LAPAROSCOPIC CHOLECYSTECTOMY WITH INTRAOPERATIVE CHOLANGIOGRAM;  Surgeon: Donnie Mesa, MD;  Location: [facility] [place];  Service: General;  Laterality: N/A;  . COLONOSCOPY WITH PROPOFOL N/A 04/28/2017   Procedure: COLONOSCOPY WITH PROPOFOL;  Surgeon: Manus Gunning, MD;  Location: [facility];  Service: Gastroenterology;  Laterality: N/A;  . DILATATION & CURETTAGE/HYSTEROSCOPY WITH MYOSURE N/A 05/06/2016   Procedure: DILATATION & CURETTAGE/HYSTEROSCOPY WITH MYOSURE;  Surgeon: Terrance Mass, MD;  Location: [facility];  Service: Gynecology;  Laterality: N/A;  request to follow around 8:45  requests one hour OR time  . DILATATION & CURETTAGE/HYSTEROSCOPY WITH MYOSURE N/A 04/03/2017   Procedure: DILATATION & CURETTAGE/HYSTEROSCOPY WITH MYOSURE;  Surgeon: Terrance Mass, MD;  Location: [facility];  Service: Gynecology;  Laterality: N/A;  . DILATION AND CURETTAGE OF UTERUS    . ESOPHAGOGASTRODUODENOSCOPY (EGD) WITH PROPOFOL N/A 04/28/2017   Procedure: ESOPHAGOGASTRODUODENOSCOPY (EGD) WITH PROPOFOL;  Surgeon: Manus Gunning, MD;  Location: [facility];  Service: Gastroenterology;  Laterality: N/A;  . FOOT SURGERY Bilateral    "took bones out; put pins in"  . KNEE ARTHROSCOPY Left 08/16/2018   Procedure: ARTHROSCOPY KNEE PARTIAL MEDIAL MENISECTOMY, CHONDROPLASTY MEDIAL AND LATERAL, PLICA RELEASE MEDIAL;  Surgeon: Dorna Leitz, MD;  Location: [facility];  Service: Orthopedics;  Laterality: Left;  . LEFT AND RIGHT HEART CATHETERIZATION WITH CORONARY ANGIOGRAM N/A 03/31/2014   Procedure: LEFT AND RIGHT HEART CATHETERIZATION WITH CORONARY ANGIOGRAM;  Surgeon: Birdie Riddle, MD;  Location: [facility];  Service: Cardiovascular;  Laterality: N/A;  . MYOMECTOMY  YRS AGO  . sonohystogram  04/14/2016   [facility]: intramural fibroid, premenopausal endometrium, right fluid filled tubular structure     Current Meds  Medication Sig  . albuterol (PROVENTIL HFA;VENTOLIN HFA) 108 (90 Base) MCG/ACT inhaler Inhale 2 puffs into the lungs every 6 (six) hours as needed for wheezing or shortness of breath.  Marland Kitchen atorvastatin (LIPITOR) 40 MG tablet Take 40 mg by mouth once a day  . Blood Glucose Monitoring Suppl (ACCU-CHEK AVIVA PLUS) w/Device KIT 1 Device by Does not apply route daily.  . Cholecalciferol (VITAMIN D3)  125 MCG (5000 UT) CAPS Take 1 capsule by mouth daily.  . empagliflozin (JARDIANCE) 10 MG TABS tablet Take 10 mg by mouth daily.  . fluconazole (DIFLUCAN) 150 MG tablet Use as needed  . fluticasone furoate-vilanterol (BREO ELLIPTA) 200-25 MCG/INH AEPB Inhale 1 puff into the lungs daily.  Marland Kitchen glucose blood (ACCU-CHEK AVIVA) test strip Use as instructed  . Lancets (ACCU-CHEK SOFT TOUCH) lancets Use as instructed  . lisinopril (PRINIVIL,ZESTRIL) 40 MG tablet TAKE 1 TABLET BY MOUTH EVERY DAY  . metFORMIN (GLUCOPHAGE) 1000 MG tablet TAKE 1 TABLET BY MOUTH TWICE A DAY WITH MEALS  . nystatin (MYCOSTATIN) 100000 UNIT/ML suspension Take 2 mLs (200,000 Units total) by mouth 4 (four) times daily. Apply 77m to each cheek  . potassium chloride SA (KLOR-CON M20) 20 MEQ tablet Take 1 tablet (20 mEq total) by mouth daily.  .Marland Kitchentiotropium (SPIRIVA) 18 MCG inhalation capsule Place 1 capsule (18 mcg total) into inhaler and inhale daily.  .Marland Kitchentorsemide (DEMADEX) 20 MG tablet Take 20 mg by mouth daily. Taking every two days  . triamcinolone (KENALOG)  0.025 % ointment Apply 1 application topically 2 (two) times daily.  . [DISCONTINUED] pindolol (VISKEN) 5 MG tablet Take 1 tablet (5 mg total) by mouth 2 (two) times daily.     Allergies:   Patient has no known allergies.   Social History   Tobacco Use  . Smoking status: Current Every Day Smoker    Packs/day: 0.50    Years: 41.00    Pack years: 20.50    Types: Cigarettes    Start date: 10/20/1976  . Smokeless tobacco: Never Used  Substance Use Topics  . Alcohol use: Yes    Comment: very occasional  . Drug use: No    Types: "Crack" cocaine    Comment: 03/30/2014 "stopped using crack 02/21/2014"     Family Hx: The patient's family history includes COPD in her mother and sister; Cancer in her sister; Hypertension in her father, mother, sister, and sister.  ROS:   Please see the history of present illness.    General:no colds or fevers, + weight changes Skin:no rashes or ulcers HEENT:no blurred vision, no congestion CV:see HPI PUL:see HPI GI:no diarrhea constipation or melena, no indigestion GU:no hematuria, no dysuria MS:no joint pain, no claudication Neuro:no syncope, no lightheadedness Endo:+ uncontrolled diabetes, no thyroid disease   All other systems reviewed and are negative.   Prior CV studies:   The following studies were reviewed today: Cath 03/31/14 IMPRESSION OF HEART CATHETERIZATION:   1. Normal left main coronary artery. 2. Minimal disease of left anterior descending artery and normal branches. 3. Normal left circumflex artery and its branches. 4. Normal right coronary artery. 5. Normal left ventricular systolic function by echocardiogram.  LVEDP 19 mmHg.  Ej. fraction  65-70 %. 6. Normal/Elevated PA and wedge pressures.  Echo 10/23/16 Study Conclusions  - Left ventricle: The cavity size is small. Wall thickness was   increased in a pattern of severe LVH. Systolic function was   vigorous. The estimated ejection fraction was 75%. Wall motion   was  normal; there were no regional wall motion abnormalities.   Doppler parameters are consistent with abnormal left ventricular   relaxation (grade 1 diastolic dysfunction). The E/e&' ratio is   >15, suggesting elevated LV filling pressure. - Left atrium: The atrium was normal in size. - Inferior vena cava: The vessel was normal in size. The   respirophasic diameter changes were in the normal range (>= 50%),  consistent with normal central venous pressure.  Impressions:  - Compard to a prior study in 2015, the LVEF is higher at 75%.   There is now severe LV wall thickening, diastolic dysfunction and   elevated LV filling pressure.  Labs/Other Tests and Data Reviewed:    EKG:  An ECG dated 07/26/18 was personally reviewed today and demonstrated:  SR normal EKG   Recent Labs: 08/12/2018: ALT 18; Hemoglobin 15.3; Platelets 238 12/17/2018: BUN 14; Creatinine, Ser 1.07; Potassium 3.9; Sodium 140   Recent Lipid Panel Lab Results  Component Value Date/Time   CHOL 152 09/20/2018 11:16 AM   TRIG 179 (H) 09/20/2018 11:16 AM   HDL 43 09/20/2018 11:16 AM   CHOLHDL 3.5 09/20/2018 11:16 AM   CHOLHDL 4.1 07/02/2017 06:52 AM   LDLCALC 73 09/20/2018 11:16 AM    Wt Readings from Last 3 Encounters:  03/24/19 260 lb (117.9 kg)  03/15/19 275 lb (124.7 kg)  03/02/19 282 lb 4 oz (128 kg)     Objective:    Vital Signs:  BP 132/78 Comment: taken by primary last week  Pulse 88   Ht 5' 3.5" (1.613 m)   Wt 260 lb (117.9 kg)   LMP 04/06/2017   BMI 45.33 kg/m    VITAL SIGNS:  reviewed  General Female in NAD Neuro A&O X 3 , follows commands and answers questions approp.  Lungs can speak in complete sentences without SOB Psych:  Pleasant affect   ASSESSMENT & PLAN:    1. Chest burning, symptoms sound GI with reflux and normal Coronary arteries 2015.  Will add protonix 40 mg daily then will see her in office 2 weeks and get EKG.  Hopefully she will be improved with protonix.  If not then  would do lexiscan myoview with her risk factors.  2. HTN controlled, there was concern on inhaler with pindolol but this is only BB she can take.  She had severe bradycardia with others, will decrease to 2.5 mg BID and see how she does may be able to stop.  She will monitor BP 3. Tobacco use -1/2 ppd, she will continue to decrease.   4. HLD on lipitor 40 and recent LDL in 12/19 was 73.  Continue statin 5. Chronic diastolic HF stable no edema per pt. No SOB.  6. Morbid obesity, wt down 22 lbs in one month on our record.  Due to her uncontrolled DM  7. Uncontrolled DM per PCP   COVID-19 Education: The signs and symptoms of COVID-19 were discussed with the patient and how to seek care for testing (follow up with PCP or arrange E-visit).  The importance of social distancing was discussed today.  Time:   Today, I have spent  15 minutes with the patient with telehealth technology discussing the above problems.     Medication Adjustments/Labs and Tests Ordered: Current medicines are reviewed at length with the patient today.  Concerns regarding medicines are outlined above.   Tests Ordered: No orders of the defined types were placed in this encounter.   Medication Changes: Meds ordered this encounter  Medications  . pantoprazole (PROTONIX) 40 MG tablet    Sig: Take 1 tablet (40 mg total) by mouth daily.    Dispense:  30 tablet    Refill:  11  . pindolol (VISKEN) 5 MG tablet    Sig: Take 0.5 tablets (2.5 mg total) by mouth 2 (two) times daily.    Dispense:  90 tablet    Refill:  3    Disposition:  Follow up in 2 week(s)  Signed, [name], NP  03/24/2019 9:42 AM    [place]

## 2019-03-22 NOTE — Telephone Encounter (Signed)
New Message    Patient scheduled for 6/5 for virtual appt please call to get consent and help setup.

## 2019-03-22 NOTE — Telephone Encounter (Signed)
Called pt back.  She has been set up to see [name], 03/24/2019 @ 8:45.  Pt is aware to have her vitals ready and current medications ready 15 mins prior to her appt when she gets the call.  Pt doesn't have mychart, but did give consent verbally for virtual visit.     Virtual Visit Pre-Appointment Phone Call  "(Name), I am calling you today to discuss your upcoming appointment. We are currently trying to limit exposure to the virus that causes COVID-19 by seeing patients at home rather than in the office."  1. "What is the BEST phone number to call the day of the visit?" - include this in appointment notes  2. "Do you have or have access to (through a family member/friend) a smartphone with video capability that we can use for your visit?" a. If yes - list this number in appt notes as "cell" (if different from BEST phone #) and list the appointment type as a VIDEO visit in appointment notes b. If no - list the appointment type as a PHONE visit in appointment notes  3. Confirm consent - "In the setting of the current Covid19 crisis, you are scheduled for a (phone or video) visit with your provider on (date) at (time).  Just as we do with many in-office visits, in order for you to participate in this visit, we must obtain consent.  If you'd like, I can send this to your mychart (if signed up) or email for you to review.  Otherwise, I can obtain your verbal consent now.  All virtual visits are billed to your insurance company just like a normal visit would be.  By agreeing to a virtual visit, we'd like you to understand that the technology does not allow for your provider to perform an examination, and thus may limit your provider's ability to fully assess your condition. If your provider identifies any concerns that need to be evaluated in person, we will make arrangements to do so.  Finally, though the technology is pretty good, we cannot assure that it will always work on either your or our end,  and in the setting of a video visit, we may have to convert it to a phone-only visit.  In either situation, we cannot ensure that we have a secure connection.  Are you willing to proceed?" STAFF: Did the patient verbally acknowledge consent to telehealth visit? Document YES/NO here: YES  4. Advise patient to be prepared - "Two hours prior to your appointment, go ahead and check your blood pressure, pulse, oxygen saturation, and your weight (if you have the equipment to check those) and write them all down. When your visit starts, your provider will ask you for this information. If you have an Apple Watch or Kardia device, please plan to have heart rate information ready on the day of your appointment. Please have a pen and paper handy nearby the day of the visit as well."  5. Give patient instructions for MyChart download to smartphone OR Doximity/Doxy.me as below if video visit (depending on what platform provider is using)  6. Inform patient they will receive a phone call 15 minutes prior to their appointment time (may be from unknown caller ID) so they should be prepared to answer    [place] has been deemed a candidate for a follow-up tele-health visit to limit community exposure during the Covid-19 pandemic. I spoke with the patient via phone to ensure availability of phone/video  source, confirm preferred email & phone number, and discuss instructions and expectations.  I reminded Alec Mcphee to be prepared with any vital sign and/or heart rhythm information that could potentially be obtained via home monitoring, at the time of her visit. I reminded Devanie Galanti to expect a phone call prior to her visit.  Jeanann Lewandowsky, [place] 03/22/2019 3:07 PM   INSTRUCTIONS FOR DOWNLOADING THE MYCHART APP TO SMARTPHONE  - The patient must first make sure to have activated MyChart and know their login information - If Apple, go to [employer] and type in  MyChart in the search bar and download the app. If Android, ask patient to go to [employer] and type in [place] in the search bar and download the app. The app is free but as with any other app downloads, their phone may require them to verify saved payment information or Apple/Android password.  - The patient will need to then log into the app with their MyChart username and password, and select [facility] as their healthcare provider to link the account. When it is time for your visit, go to the MyChart app, find appointments, and click Begin Video Visit. Be sure to Select Allow for your device to access the Microphone and Camera for your visit. You will then be connected, and your provider will be with you shortly.  **If they have any issues connecting, or need assistance please contact MyChart service desk [phone])**  **If using a computer, in order to ensure the best quality for their visit they will need to use either of the following Internet Browsers: [employer], or Google Chrome**  IF USING DOXIMITY or DOXY.ME - The patient will receive a link just prior to their visit by text.     FULL LENGTH CONSENT FOR TELE-HEALTH VISIT   I hereby voluntarily request, consent and authorize [facility] and its employed or contracted physicians, physician assistants, nurse practitioners or other licensed health care professionals (the Practitioner), to provide me with telemedicine health care services (the "Services") as deemed necessary by the treating Practitioner. I acknowledge and consent to receive the Services by the Practitioner via telemedicine. I understand that the telemedicine visit will involve communicating with the Practitioner through live audiovisual communication technology and the disclosure of certain medical information by electronic transmission. I acknowledge that I have been given the opportunity to request an in-person assessment or other available  alternative prior to the telemedicine visit and am voluntarily participating in the telemedicine visit.  I understand that I have the right to withhold or withdraw my consent to the use of telemedicine in the course of my care at any time, without affecting my right to future care or treatment, and that the Practitioner or I may terminate the telemedicine visit at any time. I understand that I have the right to inspect all information obtained and/or recorded in the course of the telemedicine visit and may receive copies of available information for a reasonable fee.  I understand that some of the potential risks of receiving the Services via telemedicine include:  Marland Kitchen Delay or interruption in medical evaluation due to technological equipment failure or disruption; . Information transmitted may not be sufficient (e.g. poor resolution of images) to allow for appropriate medical decision making by the Practitioner; and/or  . In rare instances, security protocols could fail, causing a breach of personal health information.  Furthermore, I acknowledge that it is my responsibility to provide information about my medical  history, conditions and care that is complete and accurate to the best of my ability. I acknowledge that Practitioner's advice, recommendations, and/or decision may be based on factors not within their control, such as incomplete or inaccurate data provided by me or distortions of diagnostic images or specimens that may result from electronic transmissions. I understand that the practice of medicine is not an exact science and that Practitioner makes no warranties or guarantees regarding treatment outcomes. I acknowledge that I will receive a copy of this consent concurrently upon execution via email to the email address I last provided but may also request a printed copy by calling the office of [facility].    I understand that my insurance will be billed for this visit.   I have read or had  this consent read to me. . I understand the contents of this consent, which adequately explains the benefits and risks of the Services being provided via telemedicine.  . I have been provided ample opportunity to ask questions regarding this consent and the Services and have had my questions answered to my satisfaction. . I give my informed consent for the services to be provided through the use of telemedicine in my medical care  By participating in this telemedicine visit I agree to the above.

## 2019-03-24 ENCOUNTER — Telehealth (INDEPENDENT_AMBULATORY_CARE_PROVIDER_SITE_OTHER): Payer: Medicare HMO | Admitting: Cardiology

## 2019-03-24 ENCOUNTER — Other Ambulatory Visit: Payer: Self-pay

## 2019-03-24 ENCOUNTER — Encounter: Payer: Self-pay | Admitting: Cardiology

## 2019-03-24 VITALS — BP 132/78 | HR 88 | Ht 63.5 in | Wt 260.0 lb

## 2019-03-24 DIAGNOSIS — I110 Hypertensive heart disease with heart failure: Secondary | ICD-10-CM

## 2019-03-24 DIAGNOSIS — R0789 Other chest pain: Secondary | ICD-10-CM | POA: Diagnosis not present

## 2019-03-24 DIAGNOSIS — I5032 Chronic diastolic (congestive) heart failure: Secondary | ICD-10-CM | POA: Diagnosis not present

## 2019-03-24 MED ORDER — PANTOPRAZOLE SODIUM 40 MG PO TBEC: 40.0000 mg | DELAYED_RELEASE_TABLET | Freq: Every day | ORAL | 11 refills | Status: DC

## 2019-03-24 MED ORDER — PINDOLOL 5 MG PO TABS: 2.5000 mg | ORAL_TABLET | Freq: Two times a day (BID) | ORAL | 3 refills | Status: DC

## 2019-03-24 NOTE — Patient Instructions (Addendum)
Medication Instructions:  Your physician has recommended you make the following change in your medication:  1.  START Protonix 40 mg taking 1 tablet daily.  This has [facility] into [facility] on [address]. 2.  DECREASE the Pindolol to 5 mg taking 1/2 tablet twice a day  If you need a refill on your cardiac medications before your next appointment, please call your pharmacy.   Lab work: None ordered  If you have labs (blood work) drawn today and your tests are completely normal, you will receive your results only by: Marland Kitchen MyChart Message (if you have MyChart) OR . A paper copy in the mail If you have any lab test that is abnormal or we need to change your treatment, we will call you to review the results.  Testing/Procedures: None ordered  Follow-Up: At [facility], you and your health needs are our priority.  As part of our continuing mission to provide you with exceptional heart care, we have created designated Provider Care Teams.  These Care Teams include your primary Cardiologist (physician) and Advanced Practice Providers (APPs -  Physician Assistants and Nurse Practitioners) who all work together to provide you with the care you need, when you need it. . YOU HAVE BEEN SCHEDULED AN IN OFFICE VISIT WITH Hailey Kicks, NP, 04/07/2019 AT 9:00.Marland Kitchen PLEASE ARRIVE ONLY 15 MINS PRIOR TO YOUR APPOINTMENT, NOT ANY EARLIER, SO YOU CAN BE PRESCREENED IN THE LOBBY BEFORE GETTING ON THE ELEVATOR  Any Other Special Instructions Will Be Listed Below (If Applicable).

## 2019-03-25 ENCOUNTER — Telehealth: Payer: Medicare HMO | Admitting: Cardiology

## 2019-03-29 ENCOUNTER — Encounter: Payer: Self-pay | Admitting: Family Medicine

## 2019-03-31 ENCOUNTER — Encounter: Payer: Self-pay | Admitting: Family Medicine

## 2019-03-31 ENCOUNTER — Other Ambulatory Visit: Payer: Self-pay

## 2019-03-31 ENCOUNTER — Ambulatory Visit (INDEPENDENT_AMBULATORY_CARE_PROVIDER_SITE_OTHER): Payer: Medicare HMO | Admitting: Family Medicine

## 2019-03-31 DIAGNOSIS — B356 Tinea cruris: Secondary | ICD-10-CM

## 2019-03-31 DIAGNOSIS — R252 Cramp and spasm: Secondary | ICD-10-CM

## 2019-03-31 DIAGNOSIS — E119 Type 2 diabetes mellitus without complications: Secondary | ICD-10-CM

## 2019-03-31 DIAGNOSIS — I10 Essential (primary) hypertension: Secondary | ICD-10-CM

## 2019-03-31 DIAGNOSIS — I5032 Chronic diastolic (congestive) heart failure: Secondary | ICD-10-CM

## 2019-03-31 MED ORDER — KETOCONAZOLE 2 % EX GEL: CUTANEOUS | 2 refills | Status: DC

## 2019-03-31 MED ORDER — ACCU-CHEK AVIVA DEVI: 0 refills | Status: AC

## 2019-03-31 NOTE — Patient Instructions (Addendum)
I put in an order for the diabetes classes.  Someone should call. I like your idea of finding a way to take the torsemide every day.  I am checking several things to see about the cramping. I will call tomorrow with the results and we make a plan together.   Try regular stretching to decrease the calf cramps. I sent in a external gel for the itch.

## 2019-04-01 ENCOUNTER — Encounter: Payer: Self-pay | Admitting: Family Medicine

## 2019-04-01 DIAGNOSIS — R252 Cramp and spasm: Secondary | ICD-10-CM | POA: Insufficient documentation

## 2019-04-01 LAB — CMP14+EGFR
ALT: 34 IU/L — ABNORMAL HIGH (ref 0–32)
AST: 24 IU/L (ref 0–40)
Albumin/Globulin Ratio: 1.9 (ref 1.2–2.2)
Albumin: 4.7 g/dL (ref 3.8–4.9)
Alkaline Phosphatase: 111 IU/L (ref 39–117)
BUN/Creatinine Ratio: 10 (ref 9–23)
BUN: 11 mg/dL (ref 6–24)
Bilirubin Total: 0.3 mg/dL (ref 0.0–1.2)
CO2: 23 mmol/L (ref 20–29)
Calcium: 9.9 mg/dL (ref 8.7–10.2)
Chloride: 100 mmol/L (ref 96–106)
Creatinine, Ser: 1.1 mg/dL — ABNORMAL HIGH (ref 0.57–1.00)
GFR calc Af Amer: 65 mL/min/{1.73_m2} (ref >59)
GFR calc non Af Amer: 56 mL/min/{1.73_m2} — ABNORMAL LOW (ref >59)
Globulin, Total: 2.5 g/dL (ref 1.5–4.5)
Glucose: 169 mg/dL — ABNORMAL HIGH (ref 65–99)
Potassium: 4 mmol/L (ref 3.5–5.2)
Sodium: 138 mmol/L (ref 134–144)
Total Protein: 7.2 g/dL (ref 6.0–8.5)

## 2019-04-01 LAB — MAGNESIUM: Magnesium: 1.9 mg/dL (ref 1.6–2.3)

## 2019-04-01 MED ORDER — FLUCONAZOLE 100 MG PO TABS: 100.0000 mg | ORAL_TABLET | Freq: Every day | ORAL | 0 refills | Status: DC

## 2019-04-01 NOTE — Progress Notes (Signed)
Established Patient Office Visit  Subjective:  Patient ID: Hailey Barnes, female    DOB: Mar 02, 1961  Age: 58 y.o. MRN: [id number]  CC:  Chief Complaint  Patient presents with  . Hypertension    HPI [name] presents for hypertension and other issues.   1. HBP, borderline high here.  Has been running quite high at home.  Does have peripheral edema.  On torsemide for both HBP and diastolic CHF.  Does not take torsemide daily because of severe calf cramping. 2. CHF.  Does have leg swelling.  Does not C/O DOE.  As above, not taking torsemide daily 3. Calf cramping.  Not previously worked up.  Bilateral.  Not on any stretching exercises. 4. DM.  Needs new meter.  She is not sure how to use it.  Has never been to classes.  Last A1C was very poor control. 5. Groin rash.  Using antifungal powder.  Still not clearing.  Past Medical History:  Diagnosis Date  . Arthritis    "knees" (02/21/2014), back  . CHF (congestive heart failure) ([place])   . Chronic bronchitis ([place])    "get it q yr" (02/21/2014)  . Chronic diastolic CHF (congestive heart failure) ([place])    a. Echo 10/23/2016: EF 75 %  . Chronic lower back pain   . Complication of anesthesia    "I don't come out well; I chew on my tongue"  . COPD (chronic obstructive pulmonary disease) ([place])   . Diabetes mellitus without complication ([place])    TYPE 2  . Family history of adverse reaction to anesthesia    BROTHER PONV, had a blood clot several days later and heart stopped  . Fatty liver    NONALCOLIC  . Fibroid   . Heart murmur   . Hypertension   . Infection of right eye    current dx on Saturday 7/15 - using drops  . Leg swelling    bilateral  . Migraine 1982-2009  . MVP (mitral valve prolapse)   . Neuromuscular disorder ([place])    right leg nerve pain  . Shortness of breath    WITH EXERTION  . Sinus pause    a. noted on telemetry during admission from 10/2016, lasting up to 4.4 seconds. BB discontinued.   .  Sleep apnea 02/2016   CPAP 16 TO 20  . Stroke [facility]) noted on CAT 02/2014   "light", LLE weakness remains (03/30/2014)  . 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 Tingling in extremities 12/12/2010   Uric acid and electrolytes (02/23) normal but WBC elevated.   D/Dx: carpal tunnel, ulnar neuropathy Less likely: cervical radiculopathy, vasculitis     Past Surgical History:  Procedure Laterality Date  . [place]  . CHOLECYSTECTOMY N/A 06/17/2017   Procedure: LAPAROSCOPIC CHOLECYSTECTOMY WITH INTRAOPERATIVE CHOLANGIOGRAM;  Surgeon: Donnie Mesa, MD;  Location: [facility];  Service: General;  Laterality: N/A;  . COLONOSCOPY WITH PROPOFOL N/A 04/28/2017   Procedure: COLONOSCOPY WITH PROPOFOL;  Surgeon: Manus Gunning, MD;  Location: [facility];  Service: Gastroenterology;  Laterality: N/A;  . DILATATION & CURETTAGE/HYSTEROSCOPY WITH MYOSURE N/A 05/06/2016   Procedure: DILATATION & CURETTAGE/HYSTEROSCOPY WITH MYOSURE;  Surgeon: Terrance Mass, MD;  Location: [facility];  Service: Gynecology;  Laterality: N/A;  request to follow around 8:45  requests one hour OR time  . DILATATION & CURETTAGE/HYSTEROSCOPY WITH MYOSURE N/A 04/03/2017   Procedure: DILATATION &  CURETTAGE/HYSTEROSCOPY WITH MYOSURE;  Surgeon: Terrance Mass, MD;  Location: [facility];  Service: Gynecology;  Laterality: N/A;  . DILATION AND CURETTAGE OF UTERUS    . ESOPHAGOGASTRODUODENOSCOPY (EGD) WITH PROPOFOL N/A 04/28/2017   Procedure: ESOPHAGOGASTRODUODENOSCOPY (EGD) WITH PROPOFOL;  Surgeon: Manus Gunning, MD;  Location: [facility];  Service: Gastroenterology;  Laterality: N/A;  . FOOT SURGERY Bilateral    "took bones out; put pins in"  . KNEE ARTHROSCOPY Left 08/16/2018   Procedure: ARTHROSCOPY KNEE PARTIAL MEDIAL MENISECTOMY, CHONDROPLASTY MEDIAL AND LATERAL, PLICA RELEASE MEDIAL;  Surgeon: Dorna Leitz, MD;  Location: [facility];  Service: Orthopedics;   Laterality: Left;  . LEFT AND RIGHT HEART CATHETERIZATION WITH CORONARY ANGIOGRAM N/A 03/31/2014   Procedure: LEFT AND RIGHT HEART CATHETERIZATION WITH CORONARY ANGIOGRAM;  Surgeon: Birdie Riddle, MD;  Location: [facility];  Service: Cardiovascular;  Laterality: N/A;  . MYOMECTOMY  YRS AGO  . sonohystogram  04/14/2016   [facility]: intramural fibroid, premenopausal endometrium, right fluid filled tubular structure    Family History  Problem Relation Age of Onset  . Hypertension Mother   . COPD Mother   . Hypertension Father   . Cancer Sister        UTERINE???  . COPD Sister   . Hypertension Sister   . Hypertension Sister     Social History   Socioeconomic History  . Marital status: Divorced    Spouse name: Not on file  . Number of children: 1  . Years of education: 52  . Highest education level: 12th grade  Occupational History  . Occupation: Psychologist, educational    Comment: disabled  . Occupation: patient care assistance  Social Needs  . Financial resource strain: Not hard at all  . Food insecurity    Worry: Sometimes true    Inability: Sometimes true  . Transportation needs    Medical: Yes    Non-medical: Yes  Tobacco Use  . Smoking status: Current Every Day Smoker    Packs/day: 0.50    Years: 41.00    Pack years: 20.50    Types: Cigarettes    Start date: 10/20/1976  . Smokeless tobacco: Never Used  Substance and Sexual Activity  . Alcohol use: Yes    Comment: very occasional  . Drug use: No    Types: "Crack" cocaine    Comment: 03/30/2014 "stopped using crack 02/21/2014"  . Sexual activity: Yes    Birth control/protection: Post-menopausal  Lifestyle  . Physical activity    Days per week: 0 days    Minutes per session: 0 min  . Stress: Not at all  Relationships  . Social connections    Talks on phone: More than three times a week    Gets together: Never    Attends religious service: More than 4 times per year    Active member of club or  organization: No    Attends meetings of clubs or organizations: Never    Relationship status: Divorced  . Intimate partner violence    Fear of current or ex partner: No    Emotionally abused: No    Physically abused: No    Forced sexual activity: No  Other Topics Concern  . Not on file  Social History Narrative   Divorced since 2015.   Lives alone. No pets. Daughter lives in [place]. Daughter helps with transportation. Talks daily on phone, sees once or twice a week. 2 grandkids.   Enjoys church at [employer] geared  towards people with substance abuse history.    Lives in duplex apartment, one level, three stairs to get in. Has handrails, has grab bars in bathroom. Smoke alarms.   Bakes most meals, avoids starches, rice. Eats vegetables. Drinks tea, ginger ale.    Likes to go to movies, used to walk daily but not now due to knee pain. Likes to spend time with grandchildren, go out to eat. Spend time with family and circle of friends.     Outpatient Medications Prior to Visit  Medication Sig Dispense Refill  . albuterol (PROVENTIL HFA;VENTOLIN HFA) 108 (90 Base) MCG/ACT inhaler Inhale 2 puffs into the lungs every 6 (six) hours as needed for wheezing or shortness of breath. 1 Inhaler 5  . atorvastatin (LIPITOR) 40 MG tablet Take 40 mg by mouth once a day 90 tablet 3  . Cholecalciferol (VITAMIN D3) 125 MCG (5000 UT) CAPS Take 1 capsule by mouth daily.    . empagliflozin (JARDIANCE) 10 MG TABS tablet Take 10 mg by mouth daily. 90 tablet 3  . fluticasone furoate-vilanterol (BREO ELLIPTA) 200-25 MCG/INH AEPB Inhale 1 puff into the lungs daily. 60 each 5  . glucose blood (ACCU-CHEK AVIVA) test strip Use as instructed 100 each 12  . Lancets (ACCU-CHEK SOFT TOUCH) lancets Use as instructed 100 each 12  . lisinopril (PRINIVIL,ZESTRIL) 40 MG tablet TAKE 1 TABLET BY MOUTH EVERY DAY 90 tablet 3  . metFORMIN (GLUCOPHAGE) 1000 MG tablet TAKE 1 TABLET BY MOUTH TWICE A DAY WITH  MEALS 180 tablet 1  . nystatin (MYCOSTATIN) 100000 UNIT/ML suspension Take 2 mLs (200,000 Units total) by mouth 4 (four) times daily. Apply 70m to each cheek 60 mL 1  . pantoprazole (PROTONIX) 40 MG tablet Take 1 tablet (40 mg total) by mouth daily. 30 tablet 11  . pindolol (VISKEN) 5 MG tablet Take 0.5 tablets (2.5 mg total) by mouth 2 (two) times daily. 90 tablet 3  . potassium chloride SA (KLOR-CON M20) 20 MEQ tablet Take 1 tablet (20 mEq total) by mouth daily. 90 tablet 0  . tiotropium (SPIRIVA) 18 MCG inhalation capsule Place 1 capsule (18 mcg total) into inhaler and inhale daily. 30 capsule 5  . torsemide (DEMADEX) 20 MG tablet Take 20 mg by mouth daily. Taking every two days    . triamcinolone (KENALOG) 0.025 % ointment Apply 1 application topically 2 (two) times daily. 15 g 0  . Blood Glucose Monitoring Suppl (ACCU-CHEK AVIVA PLUS) w/Device KIT 1 Device by Does not apply route daily. 1 kit 0  . fluconazole (DIFLUCAN) 150 MG tablet Use as needed     No facility-administered medications prior to visit.     No Known Allergies  ROS Review of Systems    Objective:    Physical Exam  BP 140/90   Pulse 86   Wt 270 lb 3.2 oz (122.6 kg)   LMP 04/06/2017   SpO2 98%   BMI 47.11 kg/m  Wt Readings from Last 3 Encounters:  03/31/19 270 lb 3.2 oz (122.6 kg)  03/24/19 260 lb (117.9 kg)  03/15/19 275 lb (124.7 kg)   Lungs clear Cardiac RRR without m or g Abd benign Has rash in groin and under panus.  Bilater 2+ edeama. Normal pulses in both feet.  There are no preventive care reminders to display for this patient.  There are no preventive care reminders to display for this patient.  Lab Results  Component Value Date   TSH 2.531 12/10/2013   Lab Results  Component Value Date   WBC 11.7 (H) 08/12/2018   HGB 15.3 (H) 08/12/2018   HCT 48.6 (H) 08/12/2018   MCV 85.6 08/12/2018   PLT 238 08/12/2018   Lab Results  Component Value Date   NA 138 03/31/2019   K 4.0  03/31/2019   CO2 23 03/31/2019   GLUCOSE 169 (H) 03/31/2019   BUN 11 03/31/2019   CREATININE 1.10 (H) 03/31/2019   BILITOT 0.3 03/31/2019   ALKPHOS 111 03/31/2019   AST 24 03/31/2019   ALT 34 (H) 03/31/2019   PROT 7.2 03/31/2019   ALBUMIN 4.7 03/31/2019   CALCIUM 9.9 03/31/2019   ANIONGAP 7 08/12/2018   Lab Results  Component Value Date   CHOL 152 09/20/2018   Lab Results  Component Value Date   HDL 43 09/20/2018   Lab Results  Component Value Date   LDLCALC 73 09/20/2018   Lab Results  Component Value Date   TRIG 179 (H) 09/20/2018   Lab Results  Component Value Date   CHOLHDL 3.5 09/20/2018   Lab Results  Component Value Date   HGBA1C 14.5 (A) 03/15/2019      Assessment & Plan:   Problem List Items Addressed This Visit    Tinea cruris   Relevant Medications   fluconazole (DIFLUCAN) 100 MG tablet   T2DM (type 2 diabetes mellitus) (HCC) (Chronic)   Relevant Medications   Blood Glucose Monitoring Suppl (ACCU-CHEK AVIVA) device   Other Relevant Orders   Ambulatory referral to diabetic education   Hypertension (Chronic)   Relevant Orders   CMP14+EGFR (Completed)   Magnesium (Completed)      Meds ordered this encounter  Medications  . DISCONTD: Ketoconazole 2 % GEL    Sig: Apply twice daily    Dispense:  45 g    Refill:  2  . Blood Glucose Monitoring Suppl (ACCU-CHEK AVIVA) device    Sig: Use as instructed    Dispense:  1 each    Refill:  0  . fluconazole (DIFLUCAN) 100 MG tablet    Sig: Take 1 tablet (100 mg total) by mouth daily.    Dispense:  10 tablet    Refill:  0    Follow-up: No follow-ups on file.    Zenia Resides, MD

## 2019-04-01 NOTE — Assessment & Plan Note (Signed)
Conceptualize as either tinea or intertrigo.  Not responding to topicals.  10 day rx for oral antifungal

## 2019-04-01 NOTE — Assessment & Plan Note (Signed)
Borderline OK at visit.  Good if she can take her torsemide daily.  Focus on treating cramps to allow daily torsemide.

## 2019-04-01 NOTE — Assessment & Plan Note (Signed)
Peripheral edema but no central edema.  Good if she can take her torsemide daily.  Focus on treating cramps to allow daily torsemide.

## 2019-04-01 NOTE — Assessment & Plan Note (Signed)
Refer to DM and nutrition management classes.  Also ordered new meter.

## 2019-04-01 NOTE — Assessment & Plan Note (Signed)
Mg, Ca and K all normal.  Stretching exercises and trial of tonic water.

## 2019-04-05 ENCOUNTER — Other Ambulatory Visit: Payer: Self-pay

## 2019-04-05 MED ORDER — ACCU-CHEK SOFT TOUCH LANCETS MISC: 12 refills | Status: DC

## 2019-04-05 MED ORDER — ACCU-CHEK AVIVA VI STRP: ORAL_STRIP | 12 refills | Status: DC

## 2019-04-06 NOTE — Progress Notes (Signed)
Cardiology Office Note   Date:  04/08/2019   ID:  Hailey, Barnes 01/17/1961, MRN [id number]  PCP:  [name], DO  Cardiologist:  Dr. Marlou Porch    Chief Complaint  Patient presents with  . Chest Pain      History of Present Illness: Hailey Barnes is a 58 y.o. female who presents for hx : January 2018 for chest discomfort dyspnea chronic diastolic heart failure with normal ejection fraction 70% hypertension COPD morbid obesity type 2 diabetes history of stroke and tobacco use. At that time, she was going to undergo a fibroid removal.  Her weight in the hospital in January 2018 was 319 pounds. Chest pain was very atypical, musculoskeletal improved with massaging her chest.  She did have sinus bradycardia at times especially at night with a pause of 4.4 seconds and her beta-blocker carvedilol 25 mg twice a day was discontinued at that time. Heart rate improved.  Continues to take torsemide. This helps her with her edema.  She ended up doing quite well for her fibroid removal surgery and she also had a cholecystectomy as well and did well under anesthesia. She still is having some cramping however postop.  Prior visit she was contemplating gastric sleeve. Wants to lose weight in order to either qualify for knee replacement or to rid herself of knee pain.  Today she told me her diabetes was significantly high HgB A1c of 14  With wt loss, fatigue yeast infection.  She is now on jardience.  She continues to smoke 1/2 PPD but wants to stop - we discussed cutting back 1 cigarette a month. She will try and decrease.  She is exercising on stationary bike 30 min per day, congratulated.    She is having chest pain/ heart burn.  During the day and tums helps some.  At night she has it and brash comes up into throat and nose.  Not with exercise.   With normal cath in 2015 doubt CAD but with diabetes it is a concern.    protonix added and back today for re-eval  and EKG was stable,. Her symptoms resolved.  She has no complaints today  Her BP is up since we decreased her Pindolol. Will go back to 5 mg daily, other BB have caused bradycardia.     Past Medical History:  Diagnosis Date  . Arthritis    "knees" (02/21/2014), back  . CHF (congestive heart failure) ([place])   . Chronic bronchitis ([place])    "get it q yr" (02/21/2014)  . Chronic diastolic CHF (congestive heart failure) ([place])    a. Echo 10/23/2016: EF 75 %  . Chronic lower back pain   . Complication of anesthesia    "I don't come out well; I chew on my tongue"  . COPD (chronic obstructive pulmonary disease) ([place])   . Diabetes mellitus without complication ([place])    TYPE 2  . Family history of adverse reaction to anesthesia    BROTHER PONV, had a blood clot several days later and heart stopped  . Fatty liver    NONALCOLIC  . Fibroid   . Heart murmur   . Hypertension   . Infection of right eye    current dx on Saturday 7/15 - using drops  . Leg swelling    bilateral  . Migraine 1982-2009  . MVP (mitral valve prolapse)   . Neuromuscular disorder ([place])    right leg nerve pain  . Shortness of breath  WITH EXERTION  . Sinus pause    a. noted on telemetry during admission from 10/2016, lasting up to 4.4 seconds. BB discontinued.   . Sleep apnea 02/2016   CPAP 16 TO 20  . Stroke [facility]) noted on CAT 02/2014   "light", LLE weakness remains (03/30/2014)  . 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 Tingling in extremities 12/12/2010   Uric acid and electrolytes (02/23) normal but WBC elevated.   D/Dx: carpal tunnel, ulnar neuropathy Less likely: cervical radiculopathy, vasculitis     Past Surgical History:  Procedure Laterality Date  . [place]  . CHOLECYSTECTOMY N/A 06/17/2017   Procedure: LAPAROSCOPIC CHOLECYSTECTOMY WITH INTRAOPERATIVE CHOLANGIOGRAM;  Surgeon: Donnie Mesa, MD;  Location: [place];  Service: General;   Laterality: N/A;  . COLONOSCOPY WITH PROPOFOL N/A 04/28/2017   Procedure: COLONOSCOPY WITH PROPOFOL;  Surgeon: Manus Gunning, MD;  Location: [facility];  Service: Gastroenterology;  Laterality: N/A;  . DILATATION & CURETTAGE/HYSTEROSCOPY WITH MYOSURE N/A 05/06/2016   Procedure: DILATATION & CURETTAGE/HYSTEROSCOPY WITH MYOSURE;  Surgeon: Terrance Mass, MD;  Location: [facility];  Service: Gynecology;  Laterality: N/A;  request to follow around 8:45  requests one hour OR time  . DILATATION & CURETTAGE/HYSTEROSCOPY WITH MYOSURE N/A 04/03/2017   Procedure: DILATATION & CURETTAGE/HYSTEROSCOPY WITH MYOSURE;  Surgeon: Terrance Mass, MD;  Location: [facility];  Service: Gynecology;  Laterality: N/A;  . DILATION AND CURETTAGE OF UTERUS    . ESOPHAGOGASTRODUODENOSCOPY (EGD) WITH PROPOFOL N/A 04/28/2017   Procedure: ESOPHAGOGASTRODUODENOSCOPY (EGD) WITH PROPOFOL;  Surgeon: Manus Gunning, MD;  Location: [facility];  Service: Gastroenterology;  Laterality: N/A;  . FOOT SURGERY Bilateral    "took bones out; put pins in"  . KNEE ARTHROSCOPY Left 08/16/2018   Procedure: ARTHROSCOPY KNEE PARTIAL MEDIAL MENISECTOMY, CHONDROPLASTY MEDIAL AND LATERAL, PLICA RELEASE MEDIAL;  Surgeon: Dorna Leitz, MD;  Location: [facility];  Service: Orthopedics;  Laterality: Left;  . LEFT AND RIGHT HEART CATHETERIZATION WITH CORONARY ANGIOGRAM N/A 03/31/2014   Procedure: LEFT AND RIGHT HEART CATHETERIZATION WITH CORONARY ANGIOGRAM;  Surgeon: Birdie Riddle, MD;  Location: [place] [facility];  Service: Cardiovascular;  Laterality: N/A;  . MYOMECTOMY  YRS AGO  . sonohystogram  04/14/2016   [facility]: intramural fibroid, premenopausal endometrium, right fluid filled tubular structure     Current Outpatient Medications  Medication Sig Dispense Refill  . albuterol (PROVENTIL HFA;VENTOLIN HFA) 108 (90 Base) MCG/ACT inhaler Inhale 2 puffs into the lungs every 6 (six) hours as needed for wheezing or  shortness of breath. 1 Inhaler 5  . atorvastatin (LIPITOR) 40 MG tablet Take 40 mg by mouth once a day 90 tablet 3  . Blood Glucose Monitoring Suppl (ACCU-CHEK AVIVA) device Use as instructed 1 each 0  . Cholecalciferol (VITAMIN D3) 125 MCG (5000 UT) CAPS Take 1 capsule by mouth daily.    . empagliflozin (JARDIANCE) 10 MG TABS tablet Take 10 mg by mouth daily. 90 tablet 3  . fluconazole (DIFLUCAN) 100 MG tablet Take 1 tablet (100 mg total) by mouth daily. 10 tablet 0  . fluticasone furoate-vilanterol (BREO ELLIPTA) 200-25 MCG/INH AEPB Inhale 1 puff into the lungs daily. 60 each 5  . glucose blood (ACCU-CHEK AVIVA) test strip Use as instructed 100 each 12  . Lancets (ACCU-CHEK SOFT TOUCH) lancets Use as instructed 100 each 12  . lisinopril (PRINIVIL,ZESTRIL) 40 MG tablet TAKE 1 TABLET BY MOUTH EVERY DAY  90 tablet 3  . metFORMIN (GLUCOPHAGE) 1000 MG tablet TAKE 1 TABLET BY MOUTH TWICE A DAY WITH MEALS 180 tablet 1  . nystatin (MYCOSTATIN) 100000 UNIT/ML suspension Take 2 mLs (200,000 Units total) by mouth 4 (four) times daily. Apply 82m to each cheek 60 mL 1  . pantoprazole (PROTONIX) 40 MG tablet Take 1 tablet (40 mg total) by mouth daily. 30 tablet 11  . pindolol (VISKEN) 5 MG tablet Take 0.5 tablets (2.5 mg total) by mouth 2 (two) times daily. 90 tablet 3  . potassium chloride SA (KLOR-CON M20) 20 MEQ tablet Take 1 tablet (20 mEq total) by mouth daily. 90 tablet 0  . tiotropium (SPIRIVA) 18 MCG inhalation capsule Place 1 capsule (18 mcg total) into inhaler and inhale daily. 30 capsule 5  . triamcinolone (KENALOG) 0.025 % ointment Apply 1 application topically 2 (two) times daily. 15 g 0  . torsemide (DEMADEX) 20 MG tablet Take 1 tablet by mouth daily, you may take 1 extra tablet daily by mouth only as needed 45 tablet 3   No current facility-administered medications for this visit.     Allergies:   Patient has no known allergies.    Social History:  The patient  reports that she has been  smoking cigarettes. She started smoking about 42 years ago. She has a 20.50 pack-year smoking history. She has never used smokeless tobacco. She reports current alcohol use. She reports that she does not use drugs.   Family History:  The patient's family history includes COPD in her mother and sister; Cancer in her sister; Hypertension in her father, mother, sister, and sister.    ROS:  General:no colds or fevers, no weight changes Skin:no rashes or ulcers HEENT:no blurred vision, no congestion CV:see HPI PUL:see HPI GI:no diarrhea constipation or melena, no indigestion GU:no hematuria, no dysuria MS:no joint pain, no claudication Neuro:no syncope, no lightheadedness Endo:+ diabetes, no thyroid disease  Wt Readings from Last 3 Encounters:  04/07/19 269 lb 1.9 oz (122.1 kg)  03/31/19 270 lb 3.2 oz (122.6 kg)  03/24/19 260 lb (117.9 kg)     PHYSICAL EXAM: VS:  BP (!) 138/98   Pulse 80   Ht 5' 3.5" (1.613 m)   Wt 269 lb 1.9 oz (122.1 kg)   LMP 04/06/2017   SpO2 94%   BMI 46.92 kg/m  , BMI Body mass index is 46.92 kg/m. General:Pleasant affect, NAD Skin:Warm and dry, brisk capillary refill HEENT:normocephalic, sclera clear, mucus membranes moist Neck:supple, no JVD, no bruits  Heart:S1S2 RRR without murmur, gallup, rub or click Lungs:clear without rales, rhonchi, or wheezes AJOA:CZYS non tender, + BS, do not palpate liver spleen or masses Ext:no lower ext edema, 2+ pedal pulses, 2+ radial pulses Neuro:alert and oriented X 3, MAE, follows commands, + facial symmetry    EKG:  EKG is ordered today. The ekg ordered today demonstrates SR low voltage QRS no ST changes.   Recent Labs: 08/12/2018: Hemoglobin 15.3; Platelets 238 03/31/2019: ALT 34; BUN 11; Creatinine, Ser 1.10; Magnesium 1.9; Potassium 4.0; Sodium 138    Lipid Panel    Component Value Date/Time   CHOL 152 09/20/2018 1116   TRIG 179 (H) 09/20/2018 1116   HDL 43 09/20/2018 1116   CHOLHDL 3.5 09/20/2018 1116    CHOLHDL 4.1 07/02/2017 0652   VLDL 23 07/02/2017 0652   LDLCALC 73 09/20/2018 1116       Other studies Reviewed: Additional studies/ records that were reviewed today include: . Cath 03/31/14 IMPRESSION  OF HEART CATHETERIZATION: 1. Normal left main coronary artery. 2. Minimal disease of left anterior descending artery and normal branches. 3. Normal left circumflex artery and its branches. 4. Normal right coronary artery. 5. Normal left ventricular systolic function by echocardiogram. LVEDP 19 mmHg. Ej. fraction 65-70 %. 6. Normal/Elevated PA and wedge pressures.  Echo 10/23/16 Study Conclusions  - Left ventricle: The cavity size is small. Wall thickness was increased in a pattern of severe LVH. Systolic function was vigorous. The estimated ejection fraction was 75%. Wall motion was normal; there were no regional wall motion abnormalities. Doppler parameters are consistent with abnormal left ventricular relaxation (grade 1 diastolic dysfunction). The E/e&' ratio is >15, suggesting elevated LV filling pressure. - Left atrium: The atrium was normal in size. - Inferior vena cava: The vessel was normal in size. The respirophasic diameter changes were in the normal range (>= 50%), consistent with normal central venous pressure.  Impressions:  - Compard to a prior study in 2015, the LVEF is higher at 75%. There is now severe LV wall thickening, diastolic dysfunction and elevated LV filling pressure.   ASSESSMENT AND PLAN:  1.  Chest burning resolved, she will take protonix for 2 weeks just to resolve the GI issues.  We discussed her cath and no CAD.   2.  HTN elevated today will increase BB back up to 5 mg BID 3.  HLD continue statin. 4. Chronic diastolic HF stable    Current medicines are reviewed with the patient today.  The patient Has no concerns regarding medicines.  The following changes have been made:  See above Labs/ tests ordered  today include:see above  Disposition:   FU:  see above  Signed, [name], NP  04/08/2019 3:19 PM    [facility] [place], [place], [place], [place] Phone: [phone]; Fax: [phone]

## 2019-04-07 ENCOUNTER — Other Ambulatory Visit: Payer: Self-pay

## 2019-04-07 ENCOUNTER — Encounter: Payer: Self-pay | Admitting: Cardiology

## 2019-04-07 ENCOUNTER — Ambulatory Visit (INDEPENDENT_AMBULATORY_CARE_PROVIDER_SITE_OTHER): Payer: Medicare HMO | Admitting: Cardiology

## 2019-04-07 VITALS — BP 138/98 | HR 80 | Ht 63.5 in | Wt 269.1 lb

## 2019-04-07 DIAGNOSIS — I110 Hypertensive heart disease with heart failure: Secondary | ICD-10-CM

## 2019-04-07 DIAGNOSIS — R0789 Other chest pain: Secondary | ICD-10-CM

## 2019-04-07 DIAGNOSIS — I5032 Chronic diastolic (congestive) heart failure: Secondary | ICD-10-CM

## 2019-04-07 MED ORDER — TORSEMIDE 20 MG PO TABS: ORAL_TABLET | ORAL | 3 refills | Status: DC

## 2019-04-07 NOTE — Patient Instructions (Addendum)
Medication Instructions:  Your physician has recommended you make the following change in your medication:  1.  DECREASE the Torsemide to 1 tablet daily and you may take the extra tablet daily ONLY AS NEEDED  Take the Protonix for 2 weeks then only take as needed after that  If you need a refill on your cardiac medications before your next appointment, please call your pharmacy.   Lab work: None ordered  If you have labs (blood work) drawn today and your tests are completely normal, you will receive your results only by: Marland Kitchen MyChart Message (if you have MyChart) OR . A paper copy in the mail If you have any lab test that is abnormal or we need to change your treatment, we will call you to review the results.  Testing/Procedures: None ordered  Follow-Up: At [facility], you and your health needs are our priority.  As part of our continuing mission to provide you with exceptional heart care, we have created designated Provider Care Teams.  These Care Teams include your primary Cardiologist (physician) and Advanced Practice Providers (APPs -  Physician Assistants and Nurse Practitioners) who all work together to provide you with the care you need, when you need it. You will need a follow up appointment in 3 months 07/05/2019 at 10:00 for an in office visit with Dr. Marlou Porch  Any Other Special Instructions Will Be Listed Below (If Applicable).

## 2019-04-08 ENCOUNTER — Encounter: Payer: Self-pay | Admitting: Cardiology

## 2019-04-11 ENCOUNTER — Other Ambulatory Visit: Payer: Self-pay

## 2019-04-11 ENCOUNTER — Ambulatory Visit (INDEPENDENT_AMBULATORY_CARE_PROVIDER_SITE_OTHER): Payer: Medicare HMO | Admitting: Family Medicine

## 2019-04-11 ENCOUNTER — Encounter: Payer: Self-pay | Admitting: Family Medicine

## 2019-04-11 VITALS — BP 110/82 | HR 90 | Wt 266.0 lb

## 2019-04-11 DIAGNOSIS — R630 Anorexia: Secondary | ICD-10-CM | POA: Diagnosis not present

## 2019-04-11 DIAGNOSIS — E119 Type 2 diabetes mellitus without complications: Secondary | ICD-10-CM

## 2019-04-11 MED ORDER — EMPAGLIFLOZIN 25 MG PO TABS: 25.0000 mg | ORAL_TABLET | Freq: Every day | ORAL | 3 refills | Status: DC

## 2019-04-11 NOTE — Patient Instructions (Addendum)
It was great to see you!  Our plans for today:  - We are increasing your Jardiance dose to 86m. You can take 2 of the tablets you already have to use these up, then pick up the new prescription. - Make an appointment with Dr. KValentina Lucks our pharmacist, when you check out. - Keep your nutrition appointment. - Keep checking your blood sugars. The goal is between 80-120. If you feel jittery, lightheaded, vision changes, nauseous, check your blood sugar. - Cut out all sugary beverages, aim for water only with meals.  - Follow up in one month.  We are checking some labs today, we will call you or send you a letter if they are abnormal.   Take care and seek immediate care sooner if you develop any concerns.   Dr. [facility]   Diet Recommendations for Diabetes   1. Eat at least 3 meals and 1-2 snacks per day. Never go more than 4-5 hours while awake without eating. Eat breakfast within the first hour of getting up.   2. Limit starchy foods to TWO per meal and ONE per snack. ONE portion of a starchy  food is equal to the following:   - ONE slice of bread (or its equivalent, such as half of a hamburger bun).   - 1/2 cup of a "scoopable" starchy food such as potatoes or rice.   - 15 grams of Total Carbohydrate as shown on food label.  3. Include at every meal: a protein food, a carb food, and vegetables and/or fruit.   - Obtain twice the volume of vegetables as protein or carbohydrate foods for both lunch and dinner.   - Fresh or frozen vegetables are best.   - Keep frozen vegetables on hand for a quick vegetable serving.       Starchy (carb) foods: Bread, rice, pasta, potatoes, corn, cereal, grits, crackers, bagels, muffins, all baked goods.  (Fruits, milk, and yogurt also have carbohydrate, but most of these foods will not spike your blood sugar as most starchy foods will.)  A few fruits do cause high blood sugars; use small portions of bananas (limit to 1/2 at a time), grapes,  watermelon, oranges, and most tropical fruits.    Protein foods: Meat, fish, poultry, eggs, dairy foods, and beans such as pinto and kidney beans (beans also provide carbohydrate).   Here is an example of what a healthy plate looks like:    ? Make half your plate fruits and vegetables.     ? Focus on whole fruits.     ? Vary your veggies.  ? Make half your grains whole grains. -     ? Look for the word whole at the beginning of the ingredients list    ? Some whole-grain ingredients include whole oats, whole-wheat flour,        whole-grain corn, whole-grain brown rice, and whole rye.  ? Move to low-fat and fat-free milk or yogurt.  ? Vary your protein routine. - Meat, fish, poultry (chicken, [place], eggs, beans (kidney, pinto), dairy.  ? Drink and eat less sodium, saturated fat, and added sugars.      Look for opportunities to move your body throughout your day:  Never lie down when you can sit; never sit when you can stand; never stand when you can pace.  Moving your body throughout the day is just as important as the 30 or 60 minutes of exercise at the gym!  Get social Get active  with your friends instead of going out to eat. Go for a hike, walk around the mall, or play an exercise-themed video game.   Move more at work Fit more activity into the workday. Stand during phone calls, use a printer farther from your desk, and get up to stretch each hour.    Do something new Develop a new skill to kick-start your motivation. Sign up for a class to learn how to [employer], surf, do tai chi, or play a sport.    Keep cool in the pool Dont like to sweat? Hit the local community pool for a swim, water polo, or water aerobics class to stay cool while exercising.    Stay on track Use a fitness tracker (FITBIT, Fitness Pal mobile app) to track your activity and provide motivation to reach your goals.

## 2019-04-11 NOTE — Assessment & Plan Note (Addendum)
Initial lab work-up unrevealing with ongoing weight loss, will obtain TSH today.  Up-to-date on cancer screenings.  PHQ 9 completed today, elevated at 14.  Patient states stressor is uncontrolled diabetes as she was previously well controlled in the past and does not know much about the disease process itself.  She is also unsure of what this means for her health going forward and this causes her great stress.  She previously has been on medication for anxiety in the past but took herself off of it and prefers to not be on medication for this.  She has not had any anxiety attacks in some time.  Given lack of red flags on history and exam, again believe this is likely due to her uncontrolled diabetes and stress surrounding.  Much education provided today regarding diabetes and will have her follow-up with nutrition and pharmacy for ongoing education.  Discussed counseling and medication for depression however patient declines at this time.  Follow-up in 1 month or sooner if concerns arise.

## 2019-04-11 NOTE — Assessment & Plan Note (Addendum)
Uncontrolled.  Has just started checking her sugars with ranges 190s-200 although reports her polydipsia and polyuria are better.  Tolerating Jardiance and metformin okay.  Counseled on weight loss through diet and exercise, has appointment with nutrition coming up on 05/04/19.  Will increase Jardiance dose to 25 mg daily.  Will also refer to Dr. Valentina Lucks for ongoing education.  May eventually need initiation of GLP-1 agonist for better glucose control.  Follow-up in 1 month.

## 2019-04-11 NOTE — Assessment & Plan Note (Signed)
Contributing to uncontrolled diabetes.  Counseled on weight loss through diet and exercise today, see handout provided.  Has appointment with nutrition coming up.

## 2019-04-11 NOTE — Progress Notes (Signed)
Subjective:   Patient ID: Hailey Barnes    DOB: 03/18/1961, 58 y.o. female   MRN: [id number]  Uriah Trueba is a 58 y.o. female with a history of HTN, diastolic CHF, COPD, NASH, T2DM, chondromalacia of knee, tobacco use, HLD, obesity, depression here for   Lack of appetite - Seen previously 5/26, UTD cancer screenings. Thought maybe depression and uncontrolled diabetes could be contributing.  -Reports lack of appetite is still persistent - trying to eat 3 meals per day.  - 24 hour recall: breakfast at 0830 (2 boiled eggs, sausage, coffee), lunch at noon (banana), pool party at 6pm (one piece of grilled chicken, strawberries, watermelon, chip dip, regular soda and water) - swam yesterday at pool party, sore today.  Is hoping to start walking with her daughter. - Reports she may have felt nauseous once but denies any vomiting.  She did not check her blood sugar but could tell it was up.  - feels it is about the same. - no abnormal lumps, bumps - no pain with eating - No blood in stool  Diabetes, Type 2 - Last A1c 14.5 03/15/19. - Medications: Jardiance 10 mg, metformin 1000 mg twice daily - Compliance: missing night doses (maybe 3 in the last week) - Checking BG at home: yes, CBG 206 this morning, 196 yesterday - Diet: See above - Exercise: plans to start walking with daughter - Eye exam: UTD, but is due for recheck for glasses - Foot exam: UTD - Microalbumin: N/A - Statin: yes - Denies symptoms of hypoglycemia, numbness extremities, foot ulcers/trauma - polyuria and polydipsia better than before  Review of Systems:  Per HPI.  [place], medications and smoking status reviewed.  Objective:   BP 110/82   Pulse 90   Wt 266 lb (120.7 kg)   LMP 04/06/2017   SpO2 94%   BMI 46.38 kg/m  Vitals and nursing note reviewed.  General: Obese female, in no acute distress with non-toxic appearance CV: regular rate and rhythm without murmurs, rubs, or gallops, no lower  extremity edema Lungs: clear to auscultation bilaterally with normal work of breathing Abdomen: soft, non-tender, obese abdomen, no masses or organomegaly palpable, normoactive bowel sounds Skin: warm, dry, no rashes or lesions Extremities: warm and well perfused, normal tone MSK: ROM grossly intact, strength intact, gait normal Neuro: Alert and oriented, speech normal  Depression screen [facility] 2/9 04/11/2019 03/31/2019 03/15/2019  Decreased Interest 1 0 0  Down, Depressed, Hopeless 0 0 0  PHQ - 2 Score 1 0 0  Altered sleeping 3 - -  Tired, decreased energy 2 - -  Change in appetite 3 - -  Feeling bad or failure about yourself  3 - -  Trouble concentrating 1 - -  Moving slowly or fidgety/restless 1 - -  Suicidal thoughts 0 - -  PHQ-9 Score 14 - -  Difficult doing work/chores Somewhat difficult - -  Some recent data might be hidden    Assessment & Plan:   T2DM (type 2 diabetes mellitus) ([place]) Uncontrolled.  Has just started checking her sugars with ranges 190s-200 although reports her polydipsia and polyuria are better.  Tolerating Jardiance and metformin okay.  Counseled on weight loss through diet and exercise, has appointment with nutrition coming up on 05/04/19.  Will increase Jardiance dose to 25 mg daily.  Will also refer to Dr. Valentina Lucks for ongoing education.  May eventually need initiation of GLP-1 agonist for better glucose control.  Follow-up in 1 month.  Morbid obesity (  [place]) Contributing to uncontrolled diabetes.  Counseled on weight loss through diet and exercise today, see handout provided.  Has appointment with nutrition coming up.  Lack of appetite Initial lab work-up unrevealing with ongoing weight loss, will obtain TSH today.  Up-to-date on cancer screenings.  PHQ 9 completed today, elevated at 14.  Patient states stressor is uncontrolled diabetes as she was previously well controlled in the past and does not know much about the disease process itself.  She is also unsure of  what this means for her health going forward and this causes her great stress.  She previously has been on medication for anxiety in the past but took herself off of it and prefers to not be on medication for this.  She has not had any anxiety attacks in some time.  Given lack of red flags on history and exam, again believe this is likely due to her uncontrolled diabetes and stress surrounding.  Much education provided today regarding diabetes and will have her follow-up with nutrition and pharmacy for ongoing education.  Discussed counseling and medication for depression however patient declines at this time.  Follow-up in 1 month or sooner if concerns arise.  Orders Placed This Encounter  Procedures  . TSH   Meds ordered this encounter  Medications  . empagliflozin (JARDIANCE) 25 MG TABS tablet    Sig: Take 25 mg by mouth daily.    Dispense:  90 tablet    Refill:  [place], DO PGY-2, [facility] 04/11/2019 5:27 PM

## 2019-04-12 ENCOUNTER — Encounter: Payer: Self-pay | Admitting: Family Medicine

## 2019-04-12 ENCOUNTER — Encounter (HOSPITAL_BASED_OUTPATIENT_CLINIC_OR_DEPARTMENT_OTHER): Payer: Self-pay | Admitting: Family Medicine

## 2019-04-12 LAB — TSH: TSH: 3.26 u[IU]/mL (ref 0.450–4.500)

## 2019-04-14 ENCOUNTER — Encounter: Payer: Self-pay | Admitting: Pharmacist

## 2019-04-14 ENCOUNTER — Ambulatory Visit (INDEPENDENT_AMBULATORY_CARE_PROVIDER_SITE_OTHER): Payer: Medicare HMO | Admitting: Pharmacist

## 2019-04-14 ENCOUNTER — Ambulatory Visit: Payer: Medicare HMO | Admitting: Sports Medicine

## 2019-04-14 ENCOUNTER — Other Ambulatory Visit: Payer: Self-pay

## 2019-04-14 VITALS — BP 125/87 | HR 90 | Ht 63.0 in | Wt 260.8 lb

## 2019-04-14 DIAGNOSIS — Z720 Tobacco use: Secondary | ICD-10-CM | POA: Diagnosis not present

## 2019-04-14 DIAGNOSIS — E119 Type 2 diabetes mellitus without complications: Secondary | ICD-10-CM

## 2019-04-14 MED ORDER — VICTOZA 18 MG/3ML ~~LOC~~ SOPN: 1.2000 mg | PEN_INJECTOR | Freq: Every day | SUBCUTANEOUS | 0 refills | Status: DC

## 2019-04-14 MED ORDER — NORTRIPTYLINE HCL 25 MG PO CAPS: 25.0000 mg | ORAL_CAPSULE | Freq: Every day | ORAL | 1 refills | Status: DC

## 2019-04-14 MED ORDER — NICOTINE 21 MG/24HR TD PT24: 21.0000 mg | MEDICATED_PATCH | Freq: Every day | TRANSDERMAL | 2 refills | Status: DC

## 2019-04-14 MED ORDER — ACCU-CHEK AVIVA VI STRP: ORAL_STRIP | 12 refills | Status: AC

## 2019-04-14 NOTE — Progress Notes (Signed)
Reviewed: Agree with Dr. Graylin Shiver documentation and management.

## 2019-04-14 NOTE — Assessment & Plan Note (Signed)
Tobacco use disorder Patient reports smoking 1 ppd since age 58 and is motivated to quit. She is a good candidate for success.  -Initiate nortriptyline 25 mg at bedtime x 1 week, then increase to 50 mg. This should help with her depression/mood, smoking cessation, and sleep. -Initiate nicotine 21 mg patches daily. Instructed patient to place a new patch every morning and wear it overnight. If she experiences vivid dreams, take it off before sleep. Patient verbalized understanding.

## 2019-04-14 NOTE — Progress Notes (Signed)
S:     Chief Complaint  Patient presents with  . Medication Management    Diabetes    Patient arrives in good spirits.  Presents for diabetes evaluation, education, and management at the request of PCP, Dr. Ky Barban,  at visit on 04/11/2019.   Reports some jitteriness/sleepiness during the day if she goes a while without eating, generally around noon. Notes that she has a hard time staying asleep, waking up around 3-4 am often. She is unsure what is negatively contributing to her sleep.   Upon medication review, she notes a history of thrush, but that she doesn't rinse her mouth out after taking Breo.   Patient reports Diabetes was diagnosed about 2 years ago. Was controlled on metformin for most of that time, however A1c jumped from 7.6% in 11/2018 to 14.5% on 03/15/2019. Jardiance was increased to 25 mg on 04/11/19 and patient states that she is tolerating it well.   Insurance coverage/medication affordability: [employer] Gold Plus Medicare Advantage - Patient has Extra Help, no concerns with cost   Patient reports adherence with medications currently.  Current diabetes medications include: Jardiance 25 mg daily, metformin 1000 mg BID Current hypertension medications include: lisinopril 40 mg, pindolol 5 mg BID Current hyperlipidemia medications include: atorvastatin 40 mg daily   Patient denies hypoglycemic events.   Patient reported dietary habits: Eats 3 meals/day Breakfast: 2 boiled eggs; coffee + stevia sweetener; +/- toast Lunch: Fruit + leftovers (greens, baked chicken); small servings of starch or not starches Mid afternoon snack: peanuts, fruit Dinner: Cooks- baked fish, cauliflower, broccoli Drinks: Water, diet coke  Patient-reported exercise habits: Swims, walks 5-10 minutes, but limited by her hip; does have a stationary bike at home; reports knee surgery that limits exercise    Patient reports improvement nocturia.  Patient denies neuropathy. Patient reports visual  changes as her sugar control has improved.  Patient denies self foot exams - was unsure about why she needed to check.   Age when started using tobacco on a daily basis: age 29  Brand smoked [place] 100. Number of cigarettes/day 20.    Fagerstrom Score Question Scoring Patient Score  How soon after waking do you smoke your first cigarette? <5 mins (3) 5-30 mins (2) 31-60 mins (1) >60 mins (0) 3  Do you find it difficult NOT to smoke in places where you shouldn't? Yes(1) No (0) 0  Which cigarette would you most hate to give up? First one in AM (1)  Any other one (0)  0    How many cigarettes do you smoke/day? 10 or less (0) 11-20 (1) 21-30 (2) >30 (3) 2  Do you smoke more during the first few hours after waking? Yes (1) No (0) 1  Do you smoke if you are so ill you cannot get out of bed? Yes (1) No (0) 1   Total Score   Score interpretation: low 1-2, low-to-moderate 3-4, moderate 5-7, high >7  Most recent quit attempt: 6 months Longest time ever been tobacco free: ~ 1 year (picked back up d/t curiosity if she still wanted them)  Medications used in past cessation efforts include: Chantix (took for ~ 2 months with no benefit), gum (notes nausea), patches (felt the OTC were not as effective as when she was in the hospital); bupropion was prescribed, but doesn't look like she ever took it d/t cost/access  Rates IMPORTANCE of quitting tobacco on 1-10 scale of: 10 Rates CONFIDENCE of quitting tobacco on 1-10 scale of:  7   Most common triggers to use tobacco include: stress, it's a "soother"  Motivation to quit: staying around for her grandchildren   O:  Physical Exam Vitals signs reviewed.  Constitutional:      Appearance: Normal appearance.  Pulmonary:     Effort: Pulmonary effort is normal.  Neurological:     Mental Status: She is alert.  Psychiatric:        Mood and Affect: Mood normal.        Behavior: Behavior normal.        Thought Content: Thought content  normal.        Judgment: Judgment normal.    Review of Systems  Gastrointestinal: Negative for heartburn.  Psychiatric/Behavioral: Positive for depression. The patient has insomnia.        Reports difficulty sleeping and low mood  All other systems reviewed and are negative.    Lab Results  Component Value Date   HGBA1C 14.5 (A) 03/15/2019   Vitals:   04/14/19 0945  BP: 125/87  Pulse: 90  SpO2: 95%    Lipid Panel     Component Value Date/Time   CHOL 152 09/20/2018 1116   TRIG 179 (H) 09/20/2018 1116   HDL 43 09/20/2018 1116   CHOLHDL 3.5 09/20/2018 1116   CHOLHDL 4.1 07/02/2017 0652   VLDL 23 07/02/2017 0652   LDLCALC 73 09/20/2018 1116    Home fasting CBG: 149; 159; 206 2 hour post-prandial/random CBG: not checking    Clinical ASCVD: No  The 10-year ASCVD risk score Mikey Bussing DC Jr., et al., 2013) is: 20.6%   Values used to calculate the score:     Age: 58 years     Sex: Female     Is Non-Hispanic African American: Yes     Diabetic: Yes     Tobacco smoker: Yes     Systolic Blood Pressure: 638 mmHg     Is BP treated: Yes     HDL Cholesterol: 43 mg/dL     Total Cholesterol: 152 mg/dL    A/P:  Type 2 diabetes - uncontrolled  Patient reports adherence with Jardiance and metformin. Home morning BG readings 149, 159, 206.  -Continue metformin 1000 mg BID -Continue Jardiance 25 mg daily -Initiate Victoza (liraglutide) 0.6 mg daily x 1 week, then increase to 1.2 mg daily if tolerated. First dose was given in clinic today. Provided education on proper administration and discussed potential weight loss and GI side effects with Victoza. Instructed patient to check BG 3 times daily. Goal <130 FBG and <180 PPBG. Performed teach-back and patient verbalized understanding.  -Extensively discussed pathophysiology of DM, recommended lifestyle interventions, dietary effects on glycemic control -Counseled on s/sx of and management of hypoglycemia. Instructed patient to contact  us if she has readings <80. -Next A1C anticipated August 2020. Goal A1c <7%   Tobacco use disorder Patient reports smoking 1 ppd since age 58 and is motivated to quit. She is a good candidate for success.  -Initiate nortriptyline 25 mg at bedtime x 1 week, then increase to 50 mg. This should help with her depression/mood, smoking cessation, and sleep. -Initiate nicotine 21 mg patches daily. Instructed patient to place a new patch every morning and wear it overnight. If she experiences vivid dreams, take it off before sleep. Patient verbalized understanding.    Medication management -Advised patient to rinse mouth after using Breo to prevent thrush      Written information provided.  F/U phone call in 1 week.  Total time in face-to-face counseling 60 minutes.  Patient seen with Evangeline Gula PharmD Candidate and Catie Darnelle Maffucci, PharmD, PGY2 Pharmacy Resident     . F/U PCP (Dr. Ky Barban) in 2 weeks.

## 2019-04-14 NOTE — Assessment & Plan Note (Signed)
Type 2 diabetes - uncontrolled  Patient reports adherence with Jardiance and metformin. Home morning BG readings 149, 159, 206.  -Continue metformin 1000 mg BID -Continue Jardiance 25 mg daily -Initiate Victoza (liraglutide) 0.6 mg daily x 1 week, then increase to 1.2 mg daily if tolerated. First dose was given in clinic today. Provided education on proper administration and discussed potential weight loss and GI side effects with Victoza. Instructed patient to check BG 3 times daily. Goal <130 FBG and <180 PPBG. Performed teach-back and patient verbalized understanding.  -Extensively discussed pathophysiology of DM, recommended lifestyle interventions, dietary effects on glycemic control -Counseled on s/sx of and management of hypoglycemia. Instructed patient to contact us if she has readings <80. -Next A1C anticipated August 2020. Goal A1c <7%

## 2019-04-14 NOTE — Patient Instructions (Addendum)
It was great to meet you today!  We are going to make a few changes:   1) Start Victoza (liraglutide) 0.6 mg once daily. Use 0.6 mg for one week, then increase to 1.2 mg once weekly. Continue metformin 1000 mg twice daily and Jardiance 25 mg once daily. Victoza may cause some mild nausea or stomach upset when first starting, but typically gets better over time.   2) To help with depression/mood, smoking cessation, and sleep, start nortriptyline 25 mg at bedtime for 1 week, then increase to 50 mg. This may make you sleepy, so take before bed.   3) We will also start the nicotine 21 mg patches. Place one every morning, and use a new patch the next day. If you develop strange dreams, feel free to take the patch off before going to sleep.   Continue checking blood sugars every morning. Goal fasting blood sugars are less than 130. A few meals a week, check about 2 hours after the largest meal of your day. Goal 2-hour-after-meal sugars are less than 180. Once we see those numbers, we will know your A1c is much closer, if not less than, 7%. Bring your meter to any appointments with [place] or Dr. Ky Barban. Give [place] a call if you start to see lots of readings less than 80.    If you feel like you aren't hungry, try to keep some snacks that are higher in protein but lower in carbohydrates - protein shakes, snack meats, cheese, nuts, etc. Protein helps your blood sugars be more stable.   Keep up the GREAT work with walking and using your stationary bike!  Make sure you rinse your mouth out after using your Breo inhaler to prevent thrush.   We will call you next week. Schedule follow up with Dr. Ky Barban in about 2 weeks.

## 2019-04-15 ENCOUNTER — Ambulatory Visit (INDEPENDENT_AMBULATORY_CARE_PROVIDER_SITE_OTHER): Payer: Medicare HMO | Admitting: Sports Medicine

## 2019-04-15 DIAGNOSIS — R2231 Localized swelling, mass and lump, right upper limb: Secondary | ICD-10-CM | POA: Diagnosis not present

## 2019-04-15 DIAGNOSIS — G4733 Obstructive sleep apnea (adult) (pediatric): Secondary | ICD-10-CM | POA: Diagnosis not present

## 2019-04-15 DIAGNOSIS — M180 Bilateral primary osteoarthritis of first carpometacarpal joints: Secondary | ICD-10-CM | POA: Diagnosis not present

## 2019-04-15 NOTE — Progress Notes (Addendum)
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  Pain is moderate, persistent, localized without radiation.  I reviewed the past medical history, family history, social history, surgical history, and allergies today and no changes were needed.  Please see the problem list section below in epic for further details.  Past Medical History: Past Medical History:  Diagnosis Date  . Arthritis    "knees" (02/21/2014), back  . CHF (congestive heart failure) ([place])   . Chronic bronchitis ([place])    "get it q yr" (02/21/2014)  . Chronic diastolic CHF (congestive heart failure) ([place])    a. Echo 10/23/2016: EF 75 %  . Chronic lower back pain   . Complication of anesthesia    "I don't come out well; I chew on my tongue"  . COPD (chronic obstructive pulmonary disease) ([place])   . Diabetes mellitus without complication ([place])    TYPE 2  . Family history of adverse reaction to anesthesia    BROTHER PONV, had a blood clot several days later and heart stopped  . Fatty liver    NONALCOLIC  . Fibroid   . Heart murmur   . Hypertension   . Infection of right eye    current dx on Saturday 7/15 - using drops  . Leg swelling    bilateral  . Migraine 1982-2009  . MVP (mitral valve prolapse)   . Neuromuscular disorder ([place])    right leg nerve pain  . Shortness of breath    WITH EXERTION  . Sinus pause    a. noted on telemetry during admission from 10/2016, lasting up to 4.4 seconds. BB discontinued.   . Sleep apnea 02/2016   CPAP 16 TO 20  . Stroke [facility]) noted on CAT 02/2014   "light", LLE weakness remains (03/30/2014)  . 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 Tingling in extremities 12/12/2010    Uric acid and electrolytes (02/23) normal but WBC elevated.   D/Dx: carpal tunnel, ulnar neuropathy Less likely: cervical radiculopathy, vasculitis    Past Surgical History: Past Surgical History:  Procedure Laterality Date  . [place]  . CHOLECYSTECTOMY N/A 06/17/2017   Procedure: LAPAROSCOPIC CHOLECYSTECTOMY WITH INTRAOPERATIVE CHOLANGIOGRAM;  Surgeon: Donnie Mesa, MD;  Location: [facility];  Service: General;  Laterality: N/A;  . COLONOSCOPY WITH PROPOFOL N/A 04/28/2017   Procedure: COLONOSCOPY WITH PROPOFOL;  Surgeon: Manus Gunning, MD;  Location: [facility];  Service: Gastroenterology;  Laterality: N/A;  . DILATATION & CURETTAGE/HYSTEROSCOPY WITH MYOSURE N/A 05/06/2016   Procedure: DILATATION & CURETTAGE/HYSTEROSCOPY WITH MYOSURE;  Surgeon: Terrance Mass, MD;  Location: [facility];  Service: Gynecology;  Laterality: N/A;  request to follow around 8:45  requests one hour OR time  . DILATATION & CURETTAGE/HYSTEROSCOPY WITH MYOSURE N/A 04/03/2017   Procedure: DILATATION & CURETTAGE/HYSTEROSCOPY WITH MYOSURE;  Surgeon: Terrance Mass, MD;  Location: [facility];  Service: Gynecology;  Laterality: N/A;  . DILATION AND CURETTAGE OF UTERUS    . ESOPHAGOGASTRODUODENOSCOPY (EGD) WITH PROPOFOL N/A 04/28/2017   Procedure: ESOPHAGOGASTRODUODENOSCOPY (EGD) WITH PROPOFOL;  Surgeon: Manus Gunning, MD;  Location: [facility];  Service: Gastroenterology;  Laterality: N/A;  . FOOT SURGERY Bilateral    "took bones out; put pins in"  . KNEE ARTHROSCOPY Left 08/16/2018   Procedure: ARTHROSCOPY KNEE PARTIAL MEDIAL MENISECTOMY, CHONDROPLASTY MEDIAL AND LATERAL, PLICA RELEASE MEDIAL;  Surgeon: Dorna Leitz, MD;  Location: [facility];  Service: Orthopedics;  Laterality: Left;  . LEFT AND RIGHT HEART CATHETERIZATION WITH CORONARY ANGIOGRAM N/A 03/31/2014   Procedure: LEFT AND RIGHT HEART CATHETERIZATION WITH CORONARY ANGIOGRAM;  Surgeon: Birdie Riddle, MD;  Location: [facility];   Service: Cardiovascular;  Laterality: N/A;  . MYOMECTOMY  YRS AGO  . sonohystogram  04/14/2016   [facility]: intramural fibroid, premenopausal endometrium, right fluid filled tubular structure   Social History: Social History   Socioeconomic History  . Marital status: Divorced    Spouse name: Not on file  . Number of children: 1  . Years of education: 58  . Highest education level: 12th grade  Occupational History  . Occupation: Psychologist, educational    Comment: disabled  . Occupation: patient care assistance  Social Needs  . Financial resource strain: Not hard at all  . Food insecurity    Worry: Sometimes true    Inability: Sometimes true  . Transportation needs    Medical: Yes    Non-medical: Yes  Tobacco Use  . Smoking status: Current Every Day Smoker    Packs/day: 1.00    Years: 42.00    Pack years: 42.00    Types: Cigarettes    Start date: 10/20/1976  . Smokeless tobacco: Never Used  Substance and Sexual Activity  . Alcohol use: Yes    Comment: very occasional  . Drug use: No    Types: "Crack" cocaine    Comment: 03/30/2014 "stopped using crack 02/21/2014"  . Sexual activity: Yes    Birth control/protection: Post-menopausal  Lifestyle  . Physical activity    Days per week: 0 days    Minutes per session: 0 min  . Stress: Not at all  Relationships  . Social connections    Talks on phone: More than three times a week    Gets together: Never    Attends religious service: More than 4 times per year    Active member of club or organization: No    Attends meetings of clubs or organizations: Never    Relationship status: Divorced  Other Topics Concern  . Not on file  Social History Narrative   Divorced since 2015.   Lives alone. No pets. Daughter lives in [place]. Daughter helps with transportation. Talks daily on phone, sees once or twice a week. 2 grandkids.   Enjoys church at [employer], [place] geared towards people with substance  abuse history.    Lives in duplex apartment, one level, three stairs to get in. Has handrails, has grab bars in bathroom. Smoke alarms.   Bakes most meals, avoids starches, rice. Eats vegetables. Drinks tea, ginger ale.    Likes to go to movies, used to walk daily but not now due to knee pain. Likes to spend time with grandchildren, go out to eat. Spend time with family and circle of friends.    Family History: Family History  Problem Relation Age of Onset  . Hypertension Mother   . COPD Mother   . Hypertension Father   . Cancer Sister        UTERINE???  . COPD Sister   . Hypertension Sister   . Hypertension Sister    Allergies: No Known Allergies Medications: See  med rec.  Review of Systems: No fevers, chills, night sweats, weight loss, chest pain, or shortness of breath.   Objective:    General: Well Developed, well nourished, and in no acute distress.  Neuro: Alert and oriented x3, extra-ocular muscles intact, sensation grossly intact.  HEENT: Normocephalic, atraumatic, pupils equal round reactive to light, neck supple, no masses, no lymphadenopathy, thyroid nonpalpable.  Skin: Warm and dry, no rashes. Cardiac: Regular rate and rhythm, no murmurs rubs or gallops, no lower extremity edema.  Respiratory: Clear to auscultation bilaterally. Not using accessory muscles, speaking in full sentences. Left wrist: Inspection normal with no visible erythema or swelling. ROM smooth and normal with good flexion and extension and ulnar/radial deviation that is symmetrical with opposite wrist. Palpation is normal over metacarpals, navicular, lunate, and TFCC; tendons without tenderness/ swelling No snuffbox tenderness. No tenderness over [place]. Strength 5/5 in all directions without pain. Negative tinel's and phalens signs. Negative Finkelstein sign. Negative Watson's test. Tender to palpation of the thumb basal joint.  Procedure: Real-time Ultrasound Guided injection of the  left first [facility] Device: GE Logiq E  Verbal informed consent obtained.  Time-out conducted.  Noted no overlying erythema, induration, or other signs of local infection.  Skin prepped in a sterile fashion.  Local anesthesia: Topical Ethyl chloride.  With sterile technique and under real time ultrasound guidance:  1/2 cc Kenalog 40, 1/2 cc lidocaine injected easily Completed without difficulty  Pain immediately resolved suggesting accurate placement of the medication.  Advised to call if fevers/chills, erythema, induration, drainage, or persistent bleeding.  Images permanently stored and available for review in the ultrasound unit.  Impression: Technically successful ultrasound guided injection.  Impression and Recommendations:    Primary osteoarthritis of both first carpometacarpal joints Right first [place] continues to do well after injection in May 2019. The last left CMC injection was in November 2019, repeated today. Return as needed.  Nodule of skin of right thumb Appears to be a wart versus a corn. Sounds like salicylic acid therapy failed. Return on Monday for 30-minute slot for surgical excision.   ___________________________________________ Gwen Her. Dianah Field, M.D., ABFM., CAQSM. Primary Care and Sports Medicine [facility]  Adjunct Professor of [place] [facility] [employer]

## 2019-04-15 NOTE — Assessment & Plan Note (Addendum)
Appears to be a wart versus a corn. Sounds like salicylic acid therapy failed. Return on Monday for 30-minute slot for surgical excision.

## 2019-04-15 NOTE — Assessment & Plan Note (Signed)
Right first [facility] continues to do well after injection in May 2019. The last left CMC injection was in November 2019, repeated today. Return as needed.

## 2019-04-18 ENCOUNTER — Ambulatory Visit (INDEPENDENT_AMBULATORY_CARE_PROVIDER_SITE_OTHER): Payer: Medicare HMO | Admitting: Sports Medicine

## 2019-04-18 ENCOUNTER — Encounter: Payer: Self-pay | Admitting: Sports Medicine

## 2019-04-18 DIAGNOSIS — R2231 Localized swelling, mass and lump, right upper limb: Secondary | ICD-10-CM | POA: Diagnosis not present

## 2019-04-18 DIAGNOSIS — M180 Bilateral primary osteoarthritis of first carpometacarpal joints: Secondary | ICD-10-CM

## 2019-04-18 NOTE — Assessment & Plan Note (Signed)
Right first [facility] continues to do well after an injection in May 2019. Left CMC injection was done in November 2019, and repeated on 04/15/2019, no improvement. Referral to hand surgery for consideration of [facility] arthroplasty.

## 2019-04-18 NOTE — Patient Instructions (Signed)
Incision Care, Adult An incision is a surgical cut that is made through your skin. Most incisions are closed after surgery. Your incision may be closed with stitches (sutures), staples, skin glue, or adhesive strips. You may need to return to your health care provider to have sutures or staples removed. This may occur several days to several weeks after your surgery. The incision needs to be cared for properly to prevent infection. How to care for your incision Incision care   Follow instructions from your health care provider about how to take care of your incision. Make sure you: ? Wash your hands with soap and water before you change the bandage (dressing). If soap and water are not available, use hand sanitizer. ? Change your dressing as told by your health care provider. ? Leave sutures, skin glue, or adhesive strips in place. These skin closures may need to stay in place for 2 weeks or longer. If adhesive strip edges start to loosen and curl up, you may trim the loose edges. Do not remove adhesive strips completely unless your health care provider tells you to do that.  Check your incision area every day for signs of infection. Check for: ? More redness, swelling, or pain. ? More fluid or blood. ? Warmth. ? Pus or a bad smell.  Ask your health care provider how to clean the incision. This may include: ? Using mild soap and water. ? Using a clean towel to pat the incision dry after cleaning it. ? Applying a cream or ointment. Do this only as told by your health care provider. ? Covering the incision with a clean dressing.  Ask your health care provider when you can leave the incision uncovered.  Do not take baths, swim, or use a hot tub until your health care provider approves. Ask your health care provider if you can take showers. You may only be allowed to take sponge baths for bathing. Medicines  If you were prescribed an antibiotic medicine, cream, or ointment, take or apply the  antibiotic as told by your health care provider. Do not stop taking or applying the antibiotic even if your condition improves.  Take over-the-counter and prescription medicines only as told by your health care provider. General instructions  Limit movement around your incision to improve healing. ? Avoid straining, lifting, or exercise for the first month, or for as long as told by your health care provider. ? Follow instructions from your health care provider about returning to your normal activities. ? Ask your health care provider what activities are safe.  Protect your incision from the sun when you are outside for the first 6 months, or for as long as told by your health care provider. Apply sunscreen around the scar or cover it up.  Keep all follow-up visits as told by your health care provider. This is important. Contact a health care provider if:  Your have more redness, swelling, or pain around the incision.  You have more fluid or blood coming from the incision.  Your incision feels warm to the touch.  You have pus or a bad smell coming from the incision.  You have a fever or shaking chills.  You are nauseous or you vomit.  You are dizzy.  Your sutures or staples come undone. Get help right away if:  You have a red streak coming from your incision.  Your incision bleeds through the dressing and the bleeding does not stop with gentle pressure.  The edges of  your incision open up and separate.  You have severe pain.  You have a rash.  You are confused.  You faint.  You have trouble breathing and a fast heartbeat. This information is not intended to replace advice given to you by your health care provider. Make sure you discuss any questions you have with your health care provider. Document Released: 04/25/2005 Document Revised: 10/08/2018 Document Reviewed: 04/23/2016 Elsevier Patient Education  2020 [employer].

## 2019-04-18 NOTE — Progress Notes (Signed)
   Procedure:  Excision of 1cm right thumb nodule Risks, benefits, and alternatives explained and consent obtained. Time out conducted. Surface prepped with alcohol. 2cc lidocaine with epinephine infiltrated in a field block. Adequate anesthesia ensured. Area prepped and draped in a sterile fashion. Excision performed with: Using a #11 blade I made an elliptical incision, removing the nodule, I then placed a horizontal mattress 5-0 Ethilon suture to close the incision. Hemostasis achieved. Pt stable.

## 2019-04-18 NOTE — Assessment & Plan Note (Addendum)
Surgical excision as above after failure of conservative treatment. This appeared to be a corn. Return in 1 week for suture removal.

## 2019-04-20 ENCOUNTER — Other Ambulatory Visit: Payer: Self-pay

## 2019-04-20 DIAGNOSIS — I10 Essential (primary) hypertension: Secondary | ICD-10-CM

## 2019-04-21 ENCOUNTER — Other Ambulatory Visit: Payer: Self-pay | Admitting: Family Medicine

## 2019-04-21 ENCOUNTER — Telehealth: Payer: Self-pay | Admitting: Pharmacist

## 2019-04-21 MED ORDER — ATORVASTATIN CALCIUM 40 MG PO TABS: ORAL_TABLET | ORAL | 3 refills | Status: DC

## 2019-04-21 NOTE — Telephone Encounter (Signed)
-----   Message from Leavy Cella, [facility] sent at 04/14/2019 11:48 AM EDT ----- Regarding: tobacco and DM

## 2019-04-21 NOTE — Telephone Encounter (Signed)
Follow-up RE diabetes and tobacco cessation.   Patient reports "much improved blood sugar readings"   Shared readings of 90-118 fasting with use of Victoza 0.77m plus metformin, and Jardiance. She reports continued nausea at 0.674mdose.  We agreed to stay at the same dose, continuing therapy as the nausea has lessened since starting therapy.  Dosing was adjusted to:  Stay at current 0.5.4TGr two clicks less than 0.6 to hopefully improve tolerability.   No progress on tobacco cessation as she has been unable to afford medicine.  Plans to pick up medicine (nortriptyline and nicotine patches) today. Remains motivated to quit.  Encouraged to set and keep quit date in the upcoming week.   Follow-up by phone in 1 week.

## 2019-04-22 NOTE — Telephone Encounter (Signed)
This is awesome, thank you!!

## 2019-04-26 ENCOUNTER — Other Ambulatory Visit: Payer: Self-pay

## 2019-04-26 ENCOUNTER — Ambulatory Visit (INDEPENDENT_AMBULATORY_CARE_PROVIDER_SITE_OTHER): Payer: Medicare HMO | Admitting: Sports Medicine

## 2019-04-26 ENCOUNTER — Encounter: Payer: Self-pay | Admitting: Sports Medicine

## 2019-04-26 DIAGNOSIS — R2231 Localized swelling, mass and lump, right upper limb: Secondary | ICD-10-CM

## 2019-04-26 MED ORDER — DOXYCYCLINE HYCLATE 100 MG PO TABS: 100.0000 mg | ORAL_TABLET | Freq: Two times a day (BID) | ORAL | 0 refills | Status: AC

## 2019-04-26 NOTE — Progress Notes (Signed)
  Subjective: Hailey Barnes returns, she is here to remove sutures from excision of a large nodule on her thumb.  Objective: General: Well-developed, well-nourished, and in no acute distress. Right thumb: Incision is dry, clean, there does appear to be a partial dehiscence though I think this is only superficial.  It is still moderately tender, minimally erythematous.  Sutures removed.  Assessment/plan:   Nodule of skin of right thumb Sutures removed today, her diabetes was recently uncontrolled, this is interfering with the healing. Adding a 7-day course of doxycycline, return to see me in 2 weeks.    ___________________________________________ Gwen Her. Dianah Field, M.D., ABFM., CAQSM. Primary Care and Sports Medicine [place] [facility] [facility]  Adjunct Professor of [place] [employer] [facility] [employer]

## 2019-04-26 NOTE — Assessment & Plan Note (Addendum)
Sutures removed today, her diabetes was recently uncontrolled, this is interfering with the healing. Adding a 7-day course of doxycycline, return to see me in 2 weeks.

## 2019-04-29 ENCOUNTER — Telehealth: Payer: Self-pay | Admitting: Pharmacist

## 2019-04-29 NOTE — Telephone Encounter (Signed)
-----   Message from Leavy Cella, [facility] sent at 04/21/2019  1:58 PM EDT ----- Regarding: Dm and tobacco f/u

## 2019-04-29 NOTE — Telephone Encounter (Signed)
Phone follow RE DM And tobacco  Reports improved tolerability with Victoza 0.6  Minus 1 click  Blood sugars reported 100-138 and states vision is markedly improved.    RE tobacco cessation:   Patient reports nortriptyline is helping with sleep but complains of daytime sedation with the 68m dose.  We discussed continuing short -term until patch is started as this may help with stimulation during the day.  Plans to start NRT - patches tomorrow or Monday.   Follow up with PCP in 1 week   Tentatively follow up via phone in 2 weeks if cessation achieved OR in office 7/23 if needing more assistance (decide on 7/17 with PCP)

## 2019-05-04 ENCOUNTER — Ambulatory Visit: Payer: Medicare HMO | Admitting: Registered"

## 2019-05-05 ENCOUNTER — Ambulatory Visit: Payer: Medicare HMO | Admitting: *Deleted

## 2019-05-05 ENCOUNTER — Ambulatory Visit: Payer: Medicare HMO

## 2019-05-06 ENCOUNTER — Encounter: Payer: Self-pay | Admitting: Family Medicine

## 2019-05-06 ENCOUNTER — Ambulatory Visit (INDEPENDENT_AMBULATORY_CARE_PROVIDER_SITE_OTHER): Payer: Medicare HMO | Admitting: Family Medicine

## 2019-05-06 ENCOUNTER — Other Ambulatory Visit: Payer: Self-pay

## 2019-05-06 ENCOUNTER — Ambulatory Visit
Admission: RE | Admit: 2019-05-06 | Discharge: 2019-05-06 | Disposition: A | Discharge status: Home / Self Care | Payer: Medicare HMO | Source: Ambulatory Visit | Attending: Family Medicine | Admitting: Family Medicine

## 2019-05-06 VITALS — BP 122/80 | HR 93 | Wt 259.0 lb

## 2019-05-06 DIAGNOSIS — M7731 Calcaneal spur, right foot: Secondary | ICD-10-CM | POA: Diagnosis not present

## 2019-05-06 DIAGNOSIS — M79671 Pain in right foot: Secondary | ICD-10-CM

## 2019-05-06 DIAGNOSIS — I10 Essential (primary) hypertension: Secondary | ICD-10-CM

## 2019-05-06 DIAGNOSIS — E119 Type 2 diabetes mellitus without complications: Secondary | ICD-10-CM | POA: Diagnosis not present

## 2019-05-06 DIAGNOSIS — Z716 Tobacco abuse counseling: Secondary | ICD-10-CM | POA: Diagnosis not present

## 2019-05-06 DIAGNOSIS — M2141 Flat foot [pes planus] (acquired), right foot: Secondary | ICD-10-CM | POA: Diagnosis not present

## 2019-05-06 DIAGNOSIS — M19071 Primary osteoarthritis, right ankle and foot: Secondary | ICD-10-CM | POA: Diagnosis not present

## 2019-05-06 NOTE — Telephone Encounter (Signed)
Doing well from diabetes standpoint. Has not started patches for tobacco cessation. Plans to start patches 7/20. Still having drowsiness with nortriptyline 65m, advised to take a little earlier in the night, around 8:30pm. Elected for f/u appt with pharmacy though could potentially do phone f/u.

## 2019-05-06 NOTE — Assessment & Plan Note (Signed)
At goal.  No changes made today.

## 2019-05-06 NOTE — Assessment & Plan Note (Signed)
No inciting trauma.  Slightly erythematous with localized swelling and warmth, tender to touch.  No other systemic signs or fever.  No exam findings to concern for infection including cellulitis, abscess, necrotizing fasciitis.  Sudden onset argues against arthritis though does have a history of osteoarthritis.  Could have gout flare though no known prior history of gout. Able to bear weight in the office. Will obtain XR, CBC, uric acid, BMP.

## 2019-05-06 NOTE — Progress Notes (Signed)
Subjective:   Patient ID: Hailey Barnes    DOB: Mar 24, 1961, 58 y.o. female   MRN: [id number]  Hailey Barnes is a 58 y.o. female with a history of HTN, diastolic CHF, OSA, COPD, NASH, T2DM, eczema, tobacco use, HLD, morbid obestiy, depression, OA here for   Diabetes, Type 2 - Last A1c 14.5 02/2019 - Medications: jardiance 32GM, victoza one click below 0.1UU daily, metformin 1054m BID - Compliance: having some nausea, taking one click below 07.2ZD No vomiting.  - Checking BG at home: yes, 90-140 - Diet: no sugary drinks, more water. Baking foods, small amount of starches. - Exercise: walking  - Eye exam: UTD - Foot exam: UTD - Microalbumin: U/A - Statin: yes - Denies symptoms of hypoglycemia, polyuria, polydipsia, numbness extremities, foot ulcers/trauma  Tobacco Use - taking nortriptyline 251m missing about 4 days per week due to drowsiness - quit date 7/20. - hasn't started the patches yet. Plans on starting Monday.  - smoking 1 pack per 1.5 days.  R Foot Pain On Wednesday morning, she noticed the dorsum of her right foot near her toes was red, swollen and painful.  Hurts to put weight on it.  Denies any preceding event or trauma.  States the day before she was playing with her grandbabies and denied any pain at that time.  States the pain was starting to get better yesterday but was on her feet all day which made her pain worse at the end of the day.  She denies any bug bites.  Describes pain as throbbing.  Denies any pain or rashes or swelling anywhere else on her body.  Denies numbness or tingling.  Denies fever.  Review of Systems:  Per HPI.  PM[place] Creek[facility]ications and smoking status reviewed.  Objective:   BP 122/80   Pulse 93   Wt 259 lb (117.5 kg)   LMP 04/06/2017   SpO2 96%   BMI 45.88 kg/m  Vitals and nursing note reviewed.  General: Obese female, in no acute distress with non-toxic appearance CV: regular rate and rhythm without murmurs, rubs, or  gallops, no lower extremity edema Lungs: clear to auscultation bilaterally with normal work of breathing Skin: warm, dry.  Localized swelling and warmth to dorsum of right foot along metatarsal head of second through fifth toes.  No break in the skin, crepitus, or fluctuance appreciated.  Tender to touch. Extremities: warm and well perfused, normal tone MSK: ROM grossly intact, strength intact, gait normal Neuro: Alert and oriented, speech normal  Assessment & Plan:   Hypertension At goal.  No changes made today.  T2DM (type 2 diabetes mellitus) ([place]terDoing well on current medication regimen with CBGs in desired range.  No symptoms of hypoglycemia.  Has lost 8 pounds since last visit.  Is having some nausea with Victoza but is able to control symptoms, currently taking 1 click below 0.6 mg.  Can consider decreasing Victoza if nausea continues.  Follow-up in 2 months for repeat A1c.  Foot pain, right No inciting trauma.  Slightly erythematous with localized swelling and warmth, tender to touch.  No other systemic signs or fever.  No exam findings to concern for infection including cellulitis, abscess, necrotizing fasciitis.  Sudden onset argues against arthritis though does have a history of osteoarthritis.  Could have gout flare though no known prior history of gout. Able to bear weight in the office. Will obtain XR, CBC, uric acid, BMP.  Orders Placed This Encounter  Procedures  . DG Foot Complete  Right    Standing Status:   Future    Number of Occurrences:   1    Standing Expiration Date:   07/06/2020    Order Specific Question:   Reason for Exam (SYMPTOM  OR DIAGNOSIS REQUIRED)    Answer:   acute right foot pain, TTP along distal metatarsals    Order Specific Question:   Is patient pregnant?    Answer:   No    Order Specific Question:   Preferred imaging location?    Answer:   [facility]    Order Specific Question:   Radiology Contrast Protocol - do NOT remove file path     Answer:   \\[email or web address].pdf  . CBC  . Basic Metabolic Panel  . Uric Acid   No orders of the defined types were placed in this encounter.   [name], DO PGY-3, [facility] 05/06/2019 9:42 PM

## 2019-05-06 NOTE — Assessment & Plan Note (Addendum)
Doing well on current medication regimen with CBGs in desired range.  No symptoms of hypoglycemia.  Has lost 8 pounds since last visit.  Is having some nausea with Victoza but is able to control symptoms, currently taking 1 click below 0.6 mg.  Can consider decreasing Victoza if nausea continues.  Follow-up in 2 months for repeat A1c.

## 2019-05-06 NOTE — Patient Instructions (Signed)
It was great to see you!  Congratulations on losing weight!! This is difficult to do and you are doing amazing! Keep up the great work.   Our plans for today:  - We are getting an xray of your foot. Take tylenol every 6 hours for the pain.  - We are checking some labs today, we will call you or send you a letter if they are abnormal.  - Make an appointment to follow up with Dr. Valentina Lucks. - Try taking the nortriptyline about 8:30 to help with daytime sleepiness. Once you start the patches, that may help with sleepiness.  - Come back in 2 months.  Take care and seek immediate care sooner if you develop any concerns.   Dr. [facility]

## 2019-05-07 LAB — CBC
Hematocrit: 53.1 % — ABNORMAL HIGH (ref 34.0–46.6)
Hemoglobin: 17.3 g/dL — ABNORMAL HIGH (ref 11.1–15.9)
MCH: 27.4 pg (ref 26.6–33.0)
MCHC: 32.6 g/dL (ref 31.5–35.7)
MCV: 84 fL (ref 79–97)
Platelets: 256 10*3/uL (ref 150–450)
RBC: 6.32 x10E6/uL — ABNORMAL HIGH (ref 3.77–5.28)
RDW: 13.3 % (ref 11.7–15.4)
WBC: 14.1 10*3/uL — ABNORMAL HIGH (ref 3.4–10.8)

## 2019-05-07 LAB — BASIC METABOLIC PANEL
BUN/Creatinine Ratio: 14 (ref 9–23)
BUN: 17 mg/dL (ref 6–24)
CO2: 26 mmol/L (ref 20–29)
Calcium: 10.2 mg/dL (ref 8.7–10.2)
Chloride: 97 mmol/L (ref 96–106)
Creatinine, Ser: 1.22 mg/dL — ABNORMAL HIGH (ref 0.57–1.00)
GFR calc Af Amer: 57 mL/min/{1.73_m2} — ABNORMAL LOW (ref >59)
GFR calc non Af Amer: 50 mL/min/{1.73_m2} — ABNORMAL LOW (ref >59)
Glucose: 89 mg/dL (ref 65–99)
Potassium: 3.9 mmol/L (ref 3.5–5.2)
Sodium: 143 mmol/L (ref 134–144)

## 2019-05-07 LAB — URIC ACID: Uric Acid: 8.4 mg/dL — ABNORMAL HIGH (ref 2.5–7.1)

## 2019-05-09 ENCOUNTER — Other Ambulatory Visit: Payer: Self-pay | Admitting: Family Medicine

## 2019-05-09 DIAGNOSIS — R319 Hematuria, unspecified: Secondary | ICD-10-CM

## 2019-05-09 DIAGNOSIS — D751 Secondary polycythemia: Secondary | ICD-10-CM

## 2019-05-10 ENCOUNTER — Other Ambulatory Visit: Payer: Medicare HMO

## 2019-05-10 ENCOUNTER — Ambulatory Visit (INDEPENDENT_AMBULATORY_CARE_PROVIDER_SITE_OTHER): Payer: Medicare HMO | Admitting: Sports Medicine

## 2019-05-10 ENCOUNTER — Encounter: Payer: Self-pay | Admitting: Sports Medicine

## 2019-05-10 ENCOUNTER — Other Ambulatory Visit: Payer: Self-pay

## 2019-05-10 ENCOUNTER — Telehealth: Payer: Self-pay | Admitting: *Deleted

## 2019-05-10 DIAGNOSIS — E790 Hyperuricemia without signs of inflammatory arthritis and tophaceous disease: Secondary | ICD-10-CM

## 2019-05-10 DIAGNOSIS — R2231 Localized swelling, mass and lump, right upper limb: Secondary | ICD-10-CM

## 2019-05-10 DIAGNOSIS — R319 Hematuria, unspecified: Secondary | ICD-10-CM | POA: Diagnosis not present

## 2019-05-10 DIAGNOSIS — M5416 Radiculopathy, lumbar region: Secondary | ICD-10-CM

## 2019-05-10 DIAGNOSIS — D751 Secondary polycythemia: Secondary | ICD-10-CM | POA: Diagnosis not present

## 2019-05-10 MED ORDER — NORTRIPTYLINE HCL 10 MG PO CAPS: 10.0000 mg | ORAL_CAPSULE | Freq: Every day | ORAL | 1 refills | Status: DC

## 2019-05-10 MED ORDER — ALLOPURINOL 300 MG PO TABS: 300.0000 mg | ORAL_TABLET | Freq: Every day | ORAL | 3 refills | Status: DC

## 2019-05-10 NOTE — Assessment & Plan Note (Signed)
Everything is healed now, no further evaluation needed.

## 2019-05-10 NOTE — Assessment & Plan Note (Signed)
Hot, swollen, red dorsal forefoot with elevated uric acid levels with her PCP, this is highly suspicious for gout. Adding allopurinol 300 daily. Recheck uric acid levels in 1 month either here or at the [facility]. Goal uric acid levels are between 5 and 6.

## 2019-05-10 NOTE — Telephone Encounter (Signed)
Result Notes for CBC  Notes recorded by Salvatore Marvel, CMA on 05/10/2019 at 9:24 AM EDT  Attempted to call pt. LVM stating result note. Ask pt to call back to make a lab visit for repeat labs. Salvatore Marvel, CMA   ------   Notes recorded by [name], DO on 05/09/2019 at 6:23 PM EDT  Left message on VM. Her labs show a persistent elevation of many lines of her blood counts. Discussed with preceptor and recommending repeat labs to follow up. She should come just for a lab visit.   Would love to know if her foot is feeling better. Also would ask and make sure she is not seeing any blood in her stool or urine?

## 2019-05-10 NOTE — Addendum Note (Signed)
Addended by: Leavy Cella on: 05/10/2019 12:51 PM   Modules accepted: Orders

## 2019-05-10 NOTE — Telephone Encounter (Addendum)
Patient reports improved tolerability with Victoza 0.6 minus 1 click.  States taking the shot with food on her stomach has improved tolerability.   Blood sugars remain in target range.  Asked to continue with same at this time.     RE tobacco cessation - Small reduction from 20 to 15 cigarettes reported.   Reports continued AM "groggy" feeling with nortriptyline 72m.  Reduced dose to 151mof Nortriptyline QHS (New Rx sent) - reassess for tolerability and deep sleep at next follow-up.   Patches reported to fall off after 1 hour.  Advised to thoroughly clean area with alcohol and use medical tape on patch edges to improved duration of patch.  She will try again tomorrow.   Next follow up planned by phone in 2 weeks.

## 2019-05-10 NOTE — Telephone Encounter (Signed)
Pt informed and she will come in this afternoon for a recheck.  FYI, she was started on allopurinol for the uric acid and she has not seen blood in her stool or urine. Christen Bame, CMA

## 2019-05-10 NOTE — Patient Instructions (Signed)

## 2019-05-10 NOTE — Progress Notes (Signed)
Subjective:    CC: Follow-up  HPI: Thumb nodule: Now completely closed.  Foot swelling: Had an episode of left dorsal forefoot swelling near the MTPs, gout was appropriately suspected, uric acid levels were drawn which were elevated.  Symptoms have resolved for the most part.  Left lumbar radiculopathy: Does well with occasional epidurals, her previous epidural was approximately 7 months ago, L4-L5 transforaminal on the left, MRI is about 56 years old.  Now having recurrence of pain, moderate, persistent, radiating down the left leg in an L4 distribution.  I reviewed the past medical history, family history, social history, surgical history, and allergies today and no changes were needed.  Please see the problem list section below in epic for further details.  Past Medical History: Past Medical History:  Diagnosis Date  . Arthritis    "knees" (02/21/2014), back  . CHF (congestive heart failure) ([place])   . Chronic bronchitis ([place])    "get it q yr" (02/21/2014)  . Chronic diastolic CHF (congestive heart failure) ([place])    a. Echo 10/23/2016: EF 75 %  . Chronic lower back pain   . Complication of anesthesia    "I don't come out well; I chew on my tongue"  . COPD (chronic obstructive pulmonary disease) ([place])   . Diabetes mellitus without complication ([place])    TYPE 2  . Family history of adverse reaction to anesthesia    BROTHER PONV, had a blood clot several days later and heart stopped  . Fatty liver    NONALCOLIC  . Fibroid   . Heart murmur   . Hypertension   . Infection of right eye    current dx on Saturday 7/15 - using drops  . Leg swelling    bilateral  . Migraine 1982-2009  . MVP (mitral valve prolapse)   . Neuromuscular disorder ([place])    right leg nerve pain  . Shortness of breath    WITH EXERTION  . Sinus pause    a. noted on telemetry during admission from 10/2016, lasting up to 4.4 seconds. BB discontinued.   . Sleep apnea 02/2016   CPAP 16 TO 20  . Stroke [facility])  noted on CAT 02/2014   "light", LLE weakness remains (03/30/2014)  . 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 Tingling in extremities 12/12/2010   Uric acid and electrolytes (02/23) normal but WBC elevated.   D/Dx: carpal tunnel, ulnar neuropathy Less likely: cervical radiculopathy, vasculitis    Past Surgical History: Past Surgical History:  Procedure Laterality Date  . [place]  . CHOLECYSTECTOMY N/A 06/17/2017   Procedure: LAPAROSCOPIC CHOLECYSTECTOMY WITH INTRAOPERATIVE CHOLANGIOGRAM;  Surgeon: Donnie Mesa, MD;  Location: [facility];  Service: General;  Laterality: N/A;  . COLONOSCOPY WITH PROPOFOL N/A 04/28/2017   Procedure: COLONOSCOPY WITH PROPOFOL;  Surgeon: Manus Gunning, MD;  Location: [facility];  Service: Gastroenterology;  Laterality: N/A;  . DILATATION & CURETTAGE/HYSTEROSCOPY WITH MYOSURE N/A 05/06/2016   Procedure: DILATATION & CURETTAGE/HYSTEROSCOPY WITH MYOSURE;  Surgeon: Terrance Mass, MD;  Location: [facility];  Service: Gynecology;  Laterality: N/A;  request to follow around 8:45  requests one hour OR time  . DILATATION & CURETTAGE/HYSTEROSCOPY WITH MYOSURE N/A 04/03/2017   Procedure: DILATATION & CURETTAGE/HYSTEROSCOPY WITH MYOSURE;  Surgeon: Terrance Mass, MD;  Location: [facility];  Service: Gynecology;  Laterality: N/A;  . DILATION AND CURETTAGE OF UTERUS    . ESOPHAGOGASTRODUODENOSCOPY (EGD) WITH  PROPOFOL N/A 04/28/2017   Procedure: ESOPHAGOGASTRODUODENOSCOPY (EGD) WITH PROPOFOL;  Surgeon: Manus Gunning, MD;  Location: [facility];  Service: Gastroenterology;  Laterality: N/A;  . FOOT SURGERY Bilateral    "took bones out; put pins in"  . KNEE ARTHROSCOPY Left 08/16/2018   Procedure: ARTHROSCOPY KNEE PARTIAL MEDIAL MENISECTOMY, CHONDROPLASTY MEDIAL AND LATERAL, PLICA RELEASE MEDIAL;  Surgeon: Dorna Leitz, MD;  Location: [place];  Service: Orthopedics;  Laterality: Left;  . LEFT AND  RIGHT HEART CATHETERIZATION WITH CORONARY ANGIOGRAM N/A 03/31/2014   Procedure: LEFT AND RIGHT HEART CATHETERIZATION WITH CORONARY ANGIOGRAM;  Surgeon: Birdie Riddle, MD;  Location: [facility];  Service: Cardiovascular;  Laterality: N/A;  . MYOMECTOMY  YRS AGO  . sonohystogram  04/14/2016   [facility]: intramural fibroid, premenopausal endometrium, right fluid filled tubular structure   Social History: Social History   Socioeconomic History  . Marital status: Divorced    Spouse name: Not on file  . Number of children: 1  . Years of education: 25  . Highest education level: 12th grade  Occupational History  . Occupation: Psychologist, educational    Comment: disabled  . Occupation: patient care assistance  Social Needs  . Financial resource strain: Not hard at all  . Food insecurity    Worry: Sometimes true    Inability: Sometimes true  . Transportation needs    Medical: Yes    Non-medical: Yes  Tobacco Use  . Smoking status: Current Every Day Smoker    Packs/day: 1.00    Years: 42.00    Pack years: 42.00    Types: Cigarettes    Start date: 10/20/1976  . Smokeless tobacco: Never Used  Substance and Sexual Activity  . Alcohol use: Yes    Comment: very occasional  . Drug use: No    Types: "Crack" cocaine    Comment: 03/30/2014 "stopped using crack 02/21/2014"  . Sexual activity: Yes    Birth control/protection: Post-menopausal  Lifestyle  . Physical activity    Days per week: 0 days    Minutes per session: 0 min  . Stress: Not at all  Relationships  . Social connections    Talks on phone: More than three times a week    Gets together: Never    Attends religious service: More than 4 times per year    Active member of club or organization: No    Attends meetings of clubs or organizations: Never    Relationship status: Divorced  Other Topics Concern  . Not on file  Social History Narrative   Divorced since 2015.   Lives alone. No pets. Daughter lives in  [place]. Daughter helps with transportation. Talks daily on phone, sees once or twice a week. 2 grandkids.   Enjoys church at [employer], [place] geared towards people with substance abuse history.    Lives in duplex apartment, one level, three stairs to get in. Has handrails, has grab bars in bathroom. Smoke alarms.   Bakes most meals, avoids starches, rice. Eats vegetables. Drinks tea, ginger ale.    Likes to go to movies, used to walk daily but not now due to knee pain. Likes to spend time with grandchildren, go out to eat. Spend time with family and circle of friends.    Family History: Family History  Problem Relation Age of Onset  . Hypertension Mother   . COPD Mother   . Hypertension Father   . Cancer Sister  UTERINE???  . COPD Sister   . Hypertension Sister   . Hypertension Sister    Allergies: No Known Allergies Medications: See med rec.  Review of Systems: No fevers, chills, night sweats, weight loss, chest pain, or shortness of breath.   Objective:    General: Well Developed, well nourished, and in no acute distress.  Neuro: Alert and oriented x3, extra-ocular muscles intact, sensation grossly intact.  HEENT: Normocephalic, atraumatic, pupils equal round reactive to light, neck supple, no masses, no lymphadenopathy, thyroid nonpalpable.  Skin: Warm and dry, no rashes. Cardiac: Regular rate and rhythm, no murmurs rubs or gallops, no lower extremity edema.  Respiratory: Clear to auscultation bilaterally. Not using accessory muscles, speaking in full sentences. Right thumb: Some scaling of the skin but incision is now completely closed.  Impression and Recommendations:    Nodule of skin of right thumb Everything is healed now, no further evaluation needed.  Left lumbar radiculopathy Has done well in the past with a left L4-L5 transforaminal epidurals. Previous epidural was 7 months ago, repeating. We do need an updated MRI, it is 56 years old now.   Hyperuricemia Hot, swollen, red dorsal forefoot with elevated uric acid levels with her PCP, this is highly suspicious for gout. Adding allopurinol 300 daily. Recheck uric acid levels in 1 month either here or at the [facility]. Goal uric acid levels are between 5 and 6.   ___________________________________________ Gwen Her. Dianah Field, M.D., ABFM., CAQSM. Primary Care and Sports Medicine [place] [facility]  Adjunct Professor of [place] [employer] [facility] Medicine

## 2019-05-10 NOTE — Telephone Encounter (Signed)
I was the preceptor available for this visit.

## 2019-05-10 NOTE — Assessment & Plan Note (Signed)
Has done well in the past with a left L4-L5 transforaminal epidurals. Previous epidural was 7 months ago, repeating. We do need an updated MRI, it is 58 years old now.

## 2019-05-11 ENCOUNTER — Other Ambulatory Visit: Payer: Self-pay | Admitting: Family Medicine

## 2019-05-11 DIAGNOSIS — D751 Secondary polycythemia: Secondary | ICD-10-CM

## 2019-05-11 LAB — COMPREHENSIVE METABOLIC PANEL
ALT: 17 IU/L (ref 0–32)
AST: 20 IU/L (ref 0–40)
Albumin/Globulin Ratio: 1.8 (ref 1.2–2.2)
Albumin: 4.4 g/dL (ref 3.8–4.9)
Alkaline Phosphatase: 90 IU/L (ref 39–117)
BUN/Creatinine Ratio: 13 (ref 9–23)
BUN: 16 mg/dL (ref 6–24)
Bilirubin Total: 0.3 mg/dL (ref 0.0–1.2)
CO2: 24 mmol/L (ref 20–29)
Calcium: 9.8 mg/dL (ref 8.7–10.2)
Chloride: 96 mmol/L (ref 96–106)
Creatinine, Ser: 1.22 mg/dL — ABNORMAL HIGH (ref 0.57–1.00)
GFR calc Af Amer: 57 mL/min/{1.73_m2} — ABNORMAL LOW (ref >59)
GFR calc non Af Amer: 50 mL/min/{1.73_m2} — ABNORMAL LOW (ref >59)
Globulin, Total: 2.5 g/dL (ref 1.5–4.5)
Glucose: 80 mg/dL (ref 65–99)
Potassium: 3.8 mmol/L (ref 3.5–5.2)
Sodium: 142 mmol/L (ref 134–144)
Total Protein: 6.9 g/dL (ref 6.0–8.5)

## 2019-05-11 LAB — URINALYSIS, MICROSCOPIC ONLY: Casts: NONE SEEN /lpf

## 2019-05-11 LAB — CBC WITH DIFFERENTIAL/PLATELET
Basophils Absolute: 0.1 10*3/uL (ref 0.0–0.2)
Basos: 1 %
EOS (ABSOLUTE): 0.2 10*3/uL (ref 0.0–0.4)
Eos: 1 %
Hematocrit: 50.4 % — ABNORMAL HIGH (ref 34.0–46.6)
Hemoglobin: 16.8 g/dL — ABNORMAL HIGH (ref 11.1–15.9)
Immature Grans (Abs): 0.1 10*3/uL (ref 0.0–0.1)
Immature Granulocytes: 1 %
Lymphocytes Absolute: 4.2 10*3/uL — ABNORMAL HIGH (ref 0.7–3.1)
Lymphs: 32 %
MCH: 27.5 pg (ref 26.6–33.0)
MCHC: 33.3 g/dL (ref 31.5–35.7)
MCV: 83 fL (ref 79–97)
Monocytes Absolute: 1.1 10*3/uL — ABNORMAL HIGH (ref 0.1–0.9)
Monocytes: 8 %
Neutrophils Absolute: 7.7 10*3/uL — ABNORMAL HIGH (ref 1.4–7.0)
Neutrophils: 57 %
Platelets: 236 10*3/uL (ref 150–450)
RBC: 6.11 x10E6/uL — ABNORMAL HIGH (ref 3.77–5.28)
RDW: 13.1 % (ref 11.7–15.4)
WBC: 13.3 10*3/uL — ABNORMAL HIGH (ref 3.4–10.8)

## 2019-05-11 LAB — ERYTHROPOIETIN: Erythropoietin: 15.9 m[IU]/mL (ref 2.6–18.5)

## 2019-05-11 NOTE — Telephone Encounter (Signed)
Noted, thanks!

## 2019-05-12 ENCOUNTER — Ambulatory Visit: Payer: Medicare HMO | Admitting: Registered"

## 2019-05-12 ENCOUNTER — Encounter: Payer: Self-pay | Admitting: Family Medicine

## 2019-05-12 ENCOUNTER — Telehealth: Payer: Self-pay | Admitting: Pharmacist

## 2019-05-12 LAB — SPECIMEN STATUS REPORT

## 2019-05-12 NOTE — Telephone Encounter (Signed)
Follow-up of tobacco cessation attempt.   Has not yet reduced dose of Nortriptyline from 26m to 176mHS - she will try to change with next med box fill.   She remains frustrated with the nicotine patches.   Tape has not helped.   Continues to use ~ 15 per day.   Follow-up  In 2 weeks - consider alternative options to patches at that time.    Note: Asked about lab follow-up.  I shared that I would route my note to PCP and remind of need for lab follow-up phone call.

## 2019-05-12 NOTE — Telephone Encounter (Signed)
-----   Message from Leavy Cella, [facility] sent at 04/29/2019  3:24 PM EDT ----- Regarding: Tobacco F/U and DM control

## 2019-05-13 NOTE — Addendum Note (Signed)
Addended by: Myles Gip on: 05/13/2019 06:45 PM   Modules accepted: Orders

## 2019-05-15 ENCOUNTER — Other Ambulatory Visit: Payer: Self-pay

## 2019-05-15 ENCOUNTER — Ambulatory Visit (INDEPENDENT_AMBULATORY_CARE_PROVIDER_SITE_OTHER): Payer: Medicare HMO

## 2019-05-15 DIAGNOSIS — M5416 Radiculopathy, lumbar region: Secondary | ICD-10-CM | POA: Diagnosis not present

## 2019-05-15 DIAGNOSIS — M545 Low back pain: Secondary | ICD-10-CM | POA: Diagnosis not present

## 2019-05-18 ENCOUNTER — Other Ambulatory Visit: Payer: Self-pay

## 2019-05-21 MED ORDER — TRIAMCINOLONE ACETONIDE 0.025 % EX OINT: 1.0000 "application " | TOPICAL_OINTMENT | Freq: Two times a day (BID) | CUTANEOUS | 0 refills | Status: DC

## 2019-05-24 ENCOUNTER — Telehealth: Payer: Self-pay | Admitting: Oncology

## 2019-05-24 NOTE — Telephone Encounter (Signed)
Received a new patient referral from Dr. Owens Shark for polycythemia. Pt has been cld and scheduled to see Dr. Alen Blew on 8/7 at 2pm. Pt aware to arrive 20 minutes early.

## 2019-05-25 ENCOUNTER — Other Ambulatory Visit: Payer: Self-pay

## 2019-05-25 ENCOUNTER — Telehealth: Payer: Self-pay | Admitting: Pharmacist

## 2019-05-25 ENCOUNTER — Ambulatory Visit
Admission: RE | Admit: 2019-05-25 | Discharge: 2019-05-25 | Disposition: A | Discharge status: Home / Self Care | Payer: Medicare HMO | Source: Ambulatory Visit | Attending: Sports Medicine | Admitting: Sports Medicine

## 2019-05-25 DIAGNOSIS — M545 Low back pain: Secondary | ICD-10-CM | POA: Diagnosis not present

## 2019-05-25 MED ORDER — IOPAMIDOL (ISOVUE-M 200) INJECTION 41%
1.0000 mL | Freq: Once | INTRAMUSCULAR | Status: AC
  Administered 2019-05-25: 10:00:00 1 mL via EPIDURAL

## 2019-05-25 MED ORDER — METHYLPREDNISOLONE ACETATE 40 MG/ML INJ SUSP (RADIOLOG
120.0000 mg | Freq: Once | INTRAMUSCULAR | Status: AC
  Administered 2019-05-25: 120 mg via EPIDURAL

## 2019-05-25 NOTE — Telephone Encounter (Signed)
Left message I would return call again.

## 2019-05-25 NOTE — Discharge Instructions (Signed)

## 2019-05-25 NOTE — Telephone Encounter (Signed)
-----   Message from Leavy Cella, [facility] sent at 05/10/2019 12:22 PM EDT ----- Regarding: Dm and tobacco f/u

## 2019-05-26 NOTE — Telephone Encounter (Signed)
Phone Call - F/U tobacco cessation attempt.   Patient verbalized increased smoking since her notification that she needs to visit an "oncologist" tomorrow RE her abnormal blood work.  She expressed stress and concern.   Tolerating nortriptyline 9m much better than previous 279mdose.  Plan to continue.   Follow-up plan - 1 week.   Expressed empathy and support for her work-up tomorrow.  She was appreciative.

## 2019-05-26 NOTE — Telephone Encounter (Signed)
-----   Message from Leavy Cella, [facility] sent at 05/10/2019 12:22 PM EDT ----- Regarding: Dm and tobacco f/u

## 2019-05-27 ENCOUNTER — Inpatient Hospital Stay: Payer: Medicare HMO | Attending: Oncology | Admitting: Oncology

## 2019-05-27 ENCOUNTER — Other Ambulatory Visit: Payer: Self-pay

## 2019-05-27 VITALS — BP 152/105 | HR 85 | Temp 98.3°F | Resp 20 | Ht 63.0 in | Wt 262.3 lb

## 2019-05-27 DIAGNOSIS — I5032 Chronic diastolic (congestive) heart failure: Secondary | ICD-10-CM | POA: Diagnosis not present

## 2019-05-27 DIAGNOSIS — G4730 Sleep apnea, unspecified: Secondary | ICD-10-CM

## 2019-05-27 DIAGNOSIS — Z809 Family history of malignant neoplasm, unspecified: Secondary | ICD-10-CM

## 2019-05-27 DIAGNOSIS — D72829 Elevated white blood cell count, unspecified: Secondary | ICD-10-CM | POA: Diagnosis not present

## 2019-05-27 DIAGNOSIS — I110 Hypertensive heart disease with heart failure: Secondary | ICD-10-CM

## 2019-05-27 DIAGNOSIS — E119 Type 2 diabetes mellitus without complications: Secondary | ICD-10-CM | POA: Diagnosis not present

## 2019-05-27 DIAGNOSIS — Z79899 Other long term (current) drug therapy: Secondary | ICD-10-CM

## 2019-05-27 DIAGNOSIS — Z825 Family history of asthma and other chronic lower respiratory diseases: Secondary | ICD-10-CM

## 2019-05-27 DIAGNOSIS — Z7984 Long term (current) use of oral hypoglycemic drugs: Secondary | ICD-10-CM

## 2019-05-27 DIAGNOSIS — F1911 Other psychoactive substance abuse, in remission: Secondary | ICD-10-CM

## 2019-05-27 DIAGNOSIS — Z8673 Personal history of transient ischemic attack (TIA), and cerebral infarction without residual deficits: Secondary | ICD-10-CM

## 2019-05-27 DIAGNOSIS — Z8249 Family history of ischemic heart disease and other diseases of the circulatory system: Secondary | ICD-10-CM

## 2019-05-27 DIAGNOSIS — J449 Chronic obstructive pulmonary disease, unspecified: Secondary | ICD-10-CM | POA: Diagnosis not present

## 2019-05-27 DIAGNOSIS — D751 Secondary polycythemia: Secondary | ICD-10-CM

## 2019-05-27 DIAGNOSIS — M1990 Unspecified osteoarthritis, unspecified site: Secondary | ICD-10-CM

## 2019-05-27 DIAGNOSIS — F17210 Nicotine dependence, cigarettes, uncomplicated: Secondary | ICD-10-CM

## 2019-05-27 NOTE — Progress Notes (Signed)
Reason for the request: Polycythemia     HPI: I was asked by Dr. Owens Shark to evaluate Hailey Barnes for polycythemia.  She is 58 year old woman with history of COPD, CHF and diabetes and was found to have elevated hemoglobin on a routine CBC.  On May 10, 2019 she had a CBC which showed a white cell count of 13.3, hemoglobin of 16.8 with a hematocrit of 50.  She had normal platelet count at this time.  Her electrolytes all within normal range with a creatinine of 1.2.  Previous hemoglobin on July 17 was 17.3 and it was 15.3 in 2019.  Her hemoglobin was as high as 18.1 in 2016 and had fluctuating counts since that time.  She has been also diagnosed with sleep apnea and has been using her CPAP machine although not regularly.  She has lost 40 pounds but still overweight at this time.  She denies any breathing issues at this time but does report shortness of breath on exertion.  She denies any chest pain or thrombosis episodes.  She denies any recent hospitalizations or illnesses.   She does not report any headaches, blurry vision, syncope or seizures. Does not report any fevers, chills or sweats.  Does not report any cough, wheezing or hemoptysis.  Does not report any chest pain, palpitation, orthopnea or leg edema.  Does not report any nausea, vomiting or abdominal pain.  Does not report any constipation or diarrhea.  Does not report any skeletal complaints.    Does not report frequency, urgency or hematuria.  Does not report any skin rashes or lesions. Does not report any heat or cold intolerance.  Does not report any lymphadenopathy or petechiae.  Does not report any anxiety or depression.  Remaining review of systems is negative.    Past Medical History:  Diagnosis Date  . Arthritis    "knees" (02/21/2014), back  . CHF (congestive heart failure) ([place])   . Chronic bronchitis ([place])    "get it q yr" (02/21/2014)  . Chronic diastolic CHF (congestive heart failure) ([place])    a. Echo 10/23/2016: EF 75 %  . Chronic  lower back pain   . Complication of anesthesia    "I don't come out well; I chew on my tongue"  . COPD (chronic obstructive pulmonary disease) ([place])   . Diabetes mellitus without complication ([place])    TYPE 2  . Family history of adverse reaction to anesthesia    BROTHER PONV, had a blood clot several days later and heart stopped  . Fatty liver    NONALCOLIC  . Fibroid   . Heart murmur   . Hypertension   . Infection of right eye    current dx on Saturday 7/15 - using drops  . Leg swelling    bilateral  . Migraine 1982-2009  . MVP (mitral valve prolapse)   . Neuromuscular disorder ([place])    right leg nerve pain  . Shortness of breath    WITH EXERTION  . Sinus pause    a. noted on telemetry during admission from 10/2016, lasting up to 4.4 seconds. BB discontinued.   . Sleep apnea 02/2016   CPAP 16 TO 20  . Stroke [facility]) noted on CAT 02/2014   "light", LLE weakness remains (03/30/2014)  . 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 Tingling in extremities 12/12/2010   Uric acid and electrolytes (02/23) normal but WBC elevated.   D/Dx:  carpal tunnel, ulnar neuropathy Less likely: cervical radiculopathy, vasculitis   :  Past Surgical History:  Procedure Laterality Date  . [place]  . CHOLECYSTECTOMY N/A 06/17/2017   Procedure: LAPAROSCOPIC CHOLECYSTECTOMY WITH INTRAOPERATIVE CHOLANGIOGRAM;  Surgeon: Donnie Mesa, MD;  Location: [facility];  Service: General;  Laterality: N/A;  . COLONOSCOPY WITH PROPOFOL N/A 04/28/2017   Procedure: COLONOSCOPY WITH PROPOFOL;  Surgeon: Manus Gunning, MD;  Location: [facility];  Service: Gastroenterology;  Laterality: N/A;  . DILATATION & CURETTAGE/HYSTEROSCOPY WITH MYOSURE N/A 05/06/2016   Procedure: DILATATION & CURETTAGE/HYSTEROSCOPY WITH MYOSURE;  Surgeon: Terrance Mass, MD;  Location: [facility];  Service: Gynecology;  Laterality: N/A;  request to follow around 8:45  requests  one hour OR time  . DILATATION & CURETTAGE/HYSTEROSCOPY WITH MYOSURE N/A 04/03/2017   Procedure: DILATATION & CURETTAGE/HYSTEROSCOPY WITH MYOSURE;  Surgeon: Terrance Mass, MD;  Location: [facility];  Service: Gynecology;  Laterality: N/A;  . DILATION AND CURETTAGE OF UTERUS    . ESOPHAGOGASTRODUODENOSCOPY (EGD) WITH PROPOFOL N/A 04/28/2017   Procedure: ESOPHAGOGASTRODUODENOSCOPY (EGD) WITH PROPOFOL;  Surgeon: Manus Gunning, MD;  Location: [facility];  Service: Gastroenterology;  Laterality: N/A;  . FOOT SURGERY Bilateral    "took bones out; put pins in"  . KNEE ARTHROSCOPY Left 08/16/2018   Procedure: ARTHROSCOPY KNEE PARTIAL MEDIAL MENISECTOMY, CHONDROPLASTY MEDIAL AND LATERAL, PLICA RELEASE MEDIAL;  Surgeon: Dorna Leitz, MD;  Location: [facility];  Service: Orthopedics;  Laterality: Left;  . LEFT AND RIGHT HEART CATHETERIZATION WITH CORONARY ANGIOGRAM N/A 03/31/2014   Procedure: LEFT AND RIGHT HEART CATHETERIZATION WITH CORONARY ANGIOGRAM;  Surgeon: Birdie Riddle, MD;  Location: [facility];  Service: Cardiovascular;  Laterality: N/A;  . MYOMECTOMY  YRS AGO  . sonohystogram  04/14/2016   [facility]: intramural fibroid, premenopausal endometrium, right fluid filled tubular structure  :   Current Outpatient Medications:  .  allopurinol (ZYLOPRIM) 300 MG tablet, Take 1 tablet (300 mg total) by mouth daily., Disp: 30 tablet, Rfl: 3 .  atorvastatin (LIPITOR) 40 MG tablet, Take 40 mg by mouth once a day, Disp: 90 tablet, Rfl: 3 .  Blood Glucose Monitoring Suppl (ACCU-CHEK AVIVA) device, Use as instructed, Disp: 1 each, Rfl: 0 .  Cholecalciferol (VITAMIN D3) 125 MCG (5000 UT) CAPS, Take 1 capsule by mouth daily., Disp: , Rfl:  .  empagliflozin (JARDIANCE) 25 MG TABS tablet, Take 25 mg by mouth daily., Disp: 90 tablet, Rfl: 3 .  fluticasone furoate-vilanterol (BREO ELLIPTA) 200-25 MCG/INH AEPB, Inhale 1 puff into the lungs daily., Disp: 60 each, Rfl: 5 .  glucose blood  (ACCU-CHEK AVIVA) test strip, Use to check sugar up to 3 times daily, Disp: 100 each, Rfl: 12 .  KLOR-CON M20 20 MEQ tablet, TAKE 1 TABLET BY MOUTH EVERY DAY, Disp: 90 tablet, Rfl: 0 .  Lancets (ACCU-CHEK SOFT TOUCH) lancets, Use as instructed, Disp: 100 each, Rfl: 12 .  liraglutide (VICTOZA) 18 MG/3ML SOPN, Inject 0.2 mLs (1.2 mg total) into the skin daily. Inject 0.6 mg daily for 1 week, then increase to 1.2 mg daily., Disp: 2 pen, Rfl: 0 .  lisinopril (PRINIVIL,ZESTRIL) 40 MG tablet, TAKE 1 TABLET BY MOUTH EVERY DAY, Disp: 90 tablet, Rfl: 3 .  metFORMIN (GLUCOPHAGE) 1000 MG tablet, TAKE 1 TABLET BY MOUTH TWICE A DAY WITH MEALS, Disp: 180 tablet, Rfl: 1 .  nicotine (NICODERM CQ - DOSED IN MG/24 HOURS) 21 mg/24hr patch, Place 1 patch (21 mg total) onto the skin daily., Disp:  28 patch, Rfl: 2 .  nortriptyline (PAMELOR) 10 MG capsule, Take 1 capsule (10 mg total) by mouth at bedtime., Disp: 90 capsule, Rfl: 1 .  pindolol (VISKEN) 5 MG tablet, Take 0.5 tablets (2.5 mg total) by mouth 2 (two) times daily., Disp: 90 tablet, Rfl: 3 .  tiotropium (SPIRIVA) 18 MCG inhalation capsule, Place 1 capsule (18 mcg total) into inhaler and inhale daily., Disp: 30 capsule, Rfl: 5 .  torsemide (DEMADEX) 20 MG tablet, Take 1 tablet by mouth daily, you may take 1 extra tablet daily by mouth only as needed, Disp: 45 tablet, Rfl: 3 .  triamcinolone (KENALOG) 0.025 % ointment, Apply 1 application topically 2 (two) times daily., Disp: 30 g, Rfl: 0:  No Known Allergies:  Family History  Problem Relation Age of Onset  . Hypertension Mother   . COPD Mother   . Hypertension Father   . Cancer Sister        UTERINE???  . COPD Sister   . Hypertension Sister   . Hypertension Sister   :  Social History   Socioeconomic History  . Marital status: Divorced    Spouse name: Not on file  . Number of children: 1  . Years of education: 25  . Highest education level: 12th grade  Occupational History  . Occupation:  Psychologist, educational    Comment: disabled  . Occupation: patient care assistance  Social Needs  . Financial resource strain: Not hard at all  . Food insecurity    Worry: Sometimes true    Inability: Sometimes true  . Transportation needs    Medical: Yes    Non-medical: Yes  Tobacco Use  . Smoking status: Current Every Day Smoker    Packs/day: 1.00    Years: 42.00    Pack years: 42.00    Types: Cigarettes    Start date: 10/20/1976  . Smokeless tobacco: Never Used  Substance and Sexual Activity  . Alcohol use: Yes    Comment: very occasional  . Drug use: No    Types: "Crack" cocaine    Comment: 03/30/2014 "stopped using crack 02/21/2014"  . Sexual activity: Yes    Birth control/protection: Post-menopausal  Lifestyle  . Physical activity    Days per week: 0 days    Minutes per session: 0 min  . Stress: Not at all  Relationships  . Social connections    Talks on phone: More than three times a week    Gets together: Never    Attends religious service: More than 4 times per year    Active member of club or organization: No    Attends meetings of clubs or organizations: Never    Relationship status: Divorced  . Intimate partner violence    Fear of current or ex partner: No    Emotionally abused: No    Physically abused: No    Forced sexual activity: No  Other Topics Concern  . Not on file  Social History Narrative   Divorced since 2015.   Lives alone. No pets. Daughter lives in [place]. Daughter helps with transportation. Talks daily on phone, sees once or twice a week. 2 grandkids.   Enjoys church at [employer], [place] geared towards people with substance abuse history.    Lives in duplex apartment, one level, three stairs to get in. Has handrails, has grab bars in bathroom. Smoke alarms.   Bakes most meals, avoids starches, rice. Eats vegetables. Drinks tea, ginger ale.    Likes to go  to movies, used to walk daily but not now due to knee pain. Likes to spend time  with grandchildren, go out to eat. Spend time with family and circle of friends.   :  Pertinent items are noted in HPI.  Exam:  ECOG   General appearance: alert and cooperative appeared without distress. Head: atraumatic without any abnormalities. Eyes: conjunctivae/corneas clear. PERRL.  Sclera anicteric. Throat: lips, mucosa, and tongue normal; without oral thrush or ulcers. Resp: clear to auscultation bilaterally without rhonchi, wheezes or dullness to percussion. Cardio: regular rate and rhythm, S1, S2 normal, no murmur, click, rub or gallop GI: soft, non-tender; bowel sounds normal; no masses,  no organomegaly Skin: Skin color, texture, turgor normal. No rashes or lesions Lymph nodes: Cervical, supraclavicular, and axillary nodes normal. Neurologic: Grossly normal without any motor, sensory or deep tendon reflexes. Musculoskeletal: No joint deformity or effusion.  CBC    Component Value Date/Time   WBC 13.3 (H) 05/10/2019 1352   WBC 11.7 (H) 08/12/2018 1045   RBC 6.11 (H) 05/10/2019 1352   RBC 5.68 (H) 08/12/2018 1045   HGB 16.8 (H) 05/10/2019 1352   HCT 50.4 (H) 05/10/2019 1352   PLT 236 05/10/2019 1352   MCV 83 05/10/2019 1352   MCH 27.5 05/10/2019 1352   MCH 26.9 08/12/2018 1045   MCHC 33.3 05/10/2019 1352   MCHC 31.5 08/12/2018 1045   RDW 13.1 05/10/2019 1352   LYMPHSABS 4.2 (H) 05/10/2019 1352   MONOABS 0.8 12/02/2016 1349   EOSABS 0.2 05/10/2019 1352   BASOSABS 0.1 05/10/2019 1352     Chemistry      Component Value Date/Time   NA 142 05/10/2019 1352   K 3.8 05/10/2019 1352   CL 96 05/10/2019 1352   CO2 24 05/10/2019 1352   BUN 16 05/10/2019 1352   CREATININE 1.22 (H) 05/10/2019 1352   CREATININE 1.21 (H) 12/08/2016 1538      Component Value Date/Time   CALCIUM 9.8 05/10/2019 1352   ALKPHOS 90 05/10/2019 1352   AST 20 05/10/2019 1352   ALT 17 05/10/2019 1352   BILITOT 0.3 05/10/2019 1352          Assessment and Plan:    58 year old  with:  1.  Polycythemia with a hemoglobin of 16.8 and hematocrit of 50 was detected on May 10, 2019.  Her hemoglobin is fluctuated as high as 18 for the last 4 years.  She had also fluctuating leukocytosis with normal platelets.  The differential diagnosis was reviewed today.  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  His cough is related to smoking, COPD and sleep apnea which can cause a chronic hypoxic state which can result in secondary polycythemia.  Polycythemia vera as a primary cause and part of myeloproliferative disorder is also a possibility but considered less likely.  From a management standpoint, I have recommended smoking cessation and adequate hydration and observation and surveillance for a period of time.  Obtaining a Jak 2 mutation as well as molecular testing for myeloproliferative disorder would be done if her hemoglobin persistently is elevated.  Potential complications related to her polycythemia from secondary causes are low at this time.  Strokes and MIs are considered less likely in this particular setting.  Adequate hydration and low-dose aspirin may be helpful and preventative in the future.   2.  Health maintenance recommendation: I recommended smoking cessation as well as weight loss and adherence to sleep apnea treatment.  3.  Leukocytosis: Mild and fluctuating in nature.  This is consistent with chronic smoking and secondary causes and not sign of a myeloproliferative disorder.   3.  Follow-up: I am happy to see her in the future as needed.  40  minutes was spent with the patient face-to-face today.  More than 50% of time was spent on reviewing laboratory data, differential diagnosis and management options for the future.    Thank you for the referral. A copy of this consult has been forwarded to the requesting physician.

## 2019-06-02 ENCOUNTER — Telehealth: Payer: Self-pay | Admitting: Pharmacist

## 2019-06-02 NOTE — Telephone Encounter (Signed)
-----   Message from Leavy Cella, [facility] sent at 05/26/2019  2:25 PM EDT ----- Regarding: tobacco

## 2019-06-02 NOTE — Telephone Encounter (Signed)
F/U tobacco cessation attempt.  Left HIPAA compliant message requesting call back.

## 2019-06-06 ENCOUNTER — Other Ambulatory Visit: Payer: Self-pay | Admitting: Family Medicine

## 2019-06-06 ENCOUNTER — Telehealth: Payer: Self-pay

## 2019-06-06 ENCOUNTER — Telehealth: Payer: Self-pay | Admitting: Pharmacist

## 2019-06-06 DIAGNOSIS — E119 Type 2 diabetes mellitus without complications: Secondary | ICD-10-CM

## 2019-06-06 MED ORDER — VICTOZA 18 MG/3ML ~~LOC~~ SOPN: 1.2000 mg | PEN_INJECTOR | Freq: Every day | SUBCUTANEOUS | 0 refills | Status: DC

## 2019-06-06 MED ORDER — PEN NEEDLES 32G X 4 MM MISC: 1.0000 | Freq: Three times a day (TID) | 4 refills | Status: AC

## 2019-06-06 NOTE — Telephone Encounter (Signed)
Patient LVM on nurse line stating she just used her last pen needles for her insulin. Please send a prescription to her pharmacy.

## 2019-06-06 NOTE — Telephone Encounter (Signed)
Patient called in Follow-up of request for tobacco cessation and diabetes control.   Reports high level of motivation for quitting after Heme/Onc visit.  States she has cut down to 6-7 cigs/day and plans to quit completely by the end of the week.  States interest in a pill that her friend had used for quitting smoking - did not think it was Chantix.   She may call back with that request.  Encouraged quit attempt and plan follow-up by phone in 1 week.   RE DM - requested needle tips and need for supply of victoza after her pen ran out.  States blood sugars are 100-132.  Denies any low readings (lowest was 91) leading to hypoglycemic symptoms.     Left 21 needle tips at front desk for pick-up.  Sent Rx for Victoza (liraglutide) to her pharmacy.

## 2019-06-06 NOTE — Telephone Encounter (Signed)
Noted and agree. 

## 2019-06-06 NOTE — Telephone Encounter (Signed)
Done

## 2019-06-13 ENCOUNTER — Telehealth: Payer: Self-pay | Admitting: Pharmacist

## 2019-06-13 MED ORDER — BUPROPION HCL ER (XL) 150 MG PO TB24: 150.0000 mg | ORAL_TABLET | Freq: Every day | ORAL | 1 refills | Status: DC

## 2019-06-13 NOTE — Telephone Encounter (Signed)
Reviewed and agree.

## 2019-06-13 NOTE — Telephone Encounter (Signed)
Phone F/U RE DM and tobacco cessation.   Patient reports blood sugars are doing well with Victoza.  She is in short supply but thinks she has enough to get her to her pay day.   Asked her to call for help if she is short supply.   She reports smoking more than usual with recent deaths of two friends, one by shooting (nine times) and the other by auto accident (car rolled over).  She remains interested in quitting and determined that her friend had used bupropion.   I agreed that Bupropion 174m XL daily for 7 days then increase to taking 2 daily.  Patient educated on purpose, proper use and potential adverse effects of bupropion including dry mouth and insomnia.  Following instruction patient verbalized understanding of treatment plan.  New Rx sent to her pharmacy  Plan follow-up by phone in 10-14 days.

## 2019-06-23 ENCOUNTER — Telehealth: Payer: Self-pay | Admitting: Pharmacist

## 2019-06-23 NOTE — Telephone Encounter (Signed)
Attempted phone call f/u re tobacco and DM   No answer, left message I will attempt next follow-up in 10-14 days.

## 2019-07-05 ENCOUNTER — Other Ambulatory Visit: Payer: Self-pay

## 2019-07-05 ENCOUNTER — Ambulatory Visit (INDEPENDENT_AMBULATORY_CARE_PROVIDER_SITE_OTHER): Payer: Medicare HMO | Admitting: Cardiology

## 2019-07-05 ENCOUNTER — Encounter: Payer: Self-pay | Admitting: Cardiology

## 2019-07-05 VITALS — BP 140/80 | HR 80 | Ht 63.0 in | Wt 263.0 lb

## 2019-07-05 DIAGNOSIS — I5032 Chronic diastolic (congestive) heart failure: Secondary | ICD-10-CM

## 2019-07-05 DIAGNOSIS — I110 Hypertensive heart disease with heart failure: Secondary | ICD-10-CM | POA: Diagnosis not present

## 2019-07-05 NOTE — Patient Instructions (Signed)
Medication Instructions:  The current medical regimen is effective;  continue present plan and medications.  If you need a refill on your cardiac medications before your next appointment, please call your pharmacy.   Follow-Up: At [facility], you and your health needs are our priority.  As part of our continuing mission to provide you with exceptional heart care, we have created designated Provider Care Teams.  These Care Teams include your primary Cardiologist (physician) and Advanced Practice Providers (APPs -  Physician Assistants and Nurse Practitioners) who all work together to provide you with the care you need, when you need it. You will need a follow up appointment in 6 months with [name], NP and Dr Marlou Porch in 1 year.  Please call our office 2 months in advance to schedule this appointment.  You may see Candee Furbish, MD or one of the following Advanced Practice Providers on your designated Care Team:   Truitt Merle, NP [name], NP . Kathyrn Drown, NP  Thank you for choosing [facility]!!

## 2019-07-05 NOTE — Progress Notes (Signed)
Cardiology Office Note:    Date:  07/05/2019   ID:  Hailey, Barnes 1961/03/08, MRN [id number]  PCP:  [name], DO  Cardiologist:  Candee Furbish, MD  Electrophysiologist:  None   Referring MD: [name], DO     History of Present Illness:    Hailey Barnes is a 58 y.o. female here for preoperative cardiovascular stratification prior to orthopedic surgery, knee.  I last visit with her was on 03/17/2017 where we were reviewing her hospitalization in January 2018 for chest discomfort dyspnea chronic diastolic heart failure with normal ejection fraction 70% hypertension COPD morbid obesity type 2 diabetes history of stroke and tobacco use.  At that time, she was going to undergo a fibroid removal.  Her weight in the hospital in January 2018 was 319 pounds.  Chest pain was very atypical, musculoskeletal improved with massaging her chest.  She did have sinus bradycardia at times especially at night with a pause of 4.4 seconds and her beta-blocker carvedilol 25 mg twice a day was discontinued at that time.  Heart rate improved.  Continues to take torsemide.  This helps her with her edema.  She ended up doing quite well for her fibroid removal surgery and she also had a cholecystectomy as well and did well under anesthesia.  She still is having some cramping however postop.  Now contemplating gastric sleeve.  Wants to lose weight in order to either qualify for knee replacement or to rid herself of knee pain.  07/05/2019-here for the follow-up of diastolic heart failure. Breathing ok. Weight down. No meds issues.   Past Medical History:  Diagnosis Date  . Arthritis    "knees" (02/21/2014), back  . CHF (congestive heart failure) ([place])   . Chronic bronchitis ([place])    "get it q yr" (02/21/2014)  . Chronic diastolic CHF (congestive heart failure) ([place])    a. Echo 10/23/2016: EF 75 %  . Chronic lower back pain   . Complication of anesthesia    "I don't come out well; I  chew on my tongue"  . COPD (chronic obstructive pulmonary disease) ([place])   . Diabetes mellitus without complication ([place])    TYPE 2  . Family history of adverse reaction to anesthesia    BROTHER PONV, had a blood clot several days later and heart stopped  . Fatty liver    NONALCOLIC  . Fibroid   . Heart murmur   . Hypertension   . Infection of right eye    current dx on Saturday 7/15 - using drops  . Leg swelling    bilateral  . Migraine 1982-2009  . MVP (mitral valve prolapse)   . Neuromuscular disorder ([place])    right leg nerve pain  . Shortness of breath    WITH EXERTION  . Sinus pause    a. noted on telemetry during admission from 10/2016, lasting up to 4.4 seconds. BB discontinued.   . Sleep apnea 02/2016   CPAP 16 TO 20  . Stroke [facility]) noted on CAT 02/2014   "light", LLE weakness remains (03/30/2014)  . 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 Tingling in extremities 12/12/2010   Uric acid and electrolytes (02/23) normal but WBC elevated.   D/Dx: carpal tunnel, ulnar neuropathy Less likely: cervical radiculopathy, vasculitis     Past Surgical History:  Procedure Laterality Date  . [place]  . CHOLECYSTECTOMY N/A 06/17/2017  Procedure: LAPAROSCOPIC CHOLECYSTECTOMY WITH INTRAOPERATIVE CHOLANGIOGRAM;  Surgeon: Donnie Mesa, MD;  Location: [place];  Service: General;  Laterality: N/A;  . COLONOSCOPY WITH PROPOFOL N/A 04/28/2017   Procedure: COLONOSCOPY WITH PROPOFOL;  Surgeon: Manus Gunning, MD;  Location: [facility];  Service: Gastroenterology;  Laterality: N/A;  . DILATATION & CURETTAGE/HYSTEROSCOPY WITH MYOSURE N/A 05/06/2016   Procedure: DILATATION & CURETTAGE/HYSTEROSCOPY WITH MYOSURE;  Surgeon: Terrance Mass, MD;  Location: [facility];  Service: Gynecology;  Laterality: N/A;  request to follow around 8:45  requests one hour OR time  . DILATATION & CURETTAGE/HYSTEROSCOPY WITH MYOSURE N/A  04/03/2017   Procedure: DILATATION & CURETTAGE/HYSTEROSCOPY WITH MYOSURE;  Surgeon: Terrance Mass, MD;  Location: [facility];  Service: Gynecology;  Laterality: N/A;  . DILATION AND CURETTAGE OF UTERUS    . ESOPHAGOGASTRODUODENOSCOPY (EGD) WITH PROPOFOL N/A 04/28/2017   Procedure: ESOPHAGOGASTRODUODENOSCOPY (EGD) WITH PROPOFOL;  Surgeon: Manus Gunning, MD;  Location: [facility];  Service: Gastroenterology;  Laterality: N/A;  . FOOT SURGERY Bilateral    "took bones out; put pins in"  . KNEE ARTHROSCOPY Left 08/16/2018   Procedure: ARTHROSCOPY KNEE PARTIAL MEDIAL MENISECTOMY, CHONDROPLASTY MEDIAL AND LATERAL, PLICA RELEASE MEDIAL;  Surgeon: Dorna Leitz, MD;  Location: [facility];  Service: Orthopedics;  Laterality: Left;  . LEFT AND RIGHT HEART CATHETERIZATION WITH CORONARY ANGIOGRAM N/A 03/31/2014   Procedure: LEFT AND RIGHT HEART CATHETERIZATION WITH CORONARY ANGIOGRAM;  Surgeon: Birdie Riddle, MD;  Location: [facility];  Service: Cardiovascular;  Laterality: N/A;  . MYOMECTOMY  YRS AGO  . sonohystogram  04/14/2016   [facility]: intramural fibroid, premenopausal endometrium, right fluid filled tubular structure    Current Medications: Current Meds  Medication Sig  . allopurinol (ZYLOPRIM) 300 MG tablet Take 1 tablet (300 mg total) by mouth daily.  Marland Kitchen atorvastatin (LIPITOR) 40 MG tablet Take 40 mg by mouth once a day  . Blood Glucose Monitoring Suppl (ACCU-CHEK AVIVA) device Use as instructed  . buPROPion (WELLBUTRIN XL) 150 MG 24 hr tablet Take 1 tablet (150 mg total) by mouth daily. Increase to 2 QAM daily in 1 week  . Cholecalciferol (VITAMIN D3) 125 MCG (5000 UT) CAPS Take 1 capsule by mouth daily.  . empagliflozin (JARDIANCE) 25 MG TABS tablet Take 25 mg by mouth daily.  . fluticasone furoate-vilanterol (BREO ELLIPTA) 200-25 MCG/INH AEPB Inhale 1 puff into the lungs daily.  Marland Kitchen glucose blood (ACCU-CHEK AVIVA) test strip Use to check sugar up to 3 times daily   . Insulin Pen Needle (PEN NEEDLES) 32G X 4 MM MISC 1 each by Does not apply route 3 (three) times daily.  Marland Kitchen KLOR-CON M20 20 MEQ tablet TAKE 1 TABLET BY MOUTH EVERY DAY  . Lancets (ACCU-CHEK SOFT TOUCH) lancets Use as instructed  . liraglutide (VICTOZA) 18 MG/3ML SOPN Inject 0.2 mLs (1.2 mg total) into the skin daily.  Marland Kitchen lisinopril (PRINIVIL,ZESTRIL) 40 MG tablet TAKE 1 TABLET BY MOUTH EVERY DAY  . metFORMIN (GLUCOPHAGE) 1000 MG tablet TAKE 1 TABLET BY MOUTH TWICE A DAY WITH MEALS  . nicotine (NICODERM CQ - DOSED IN MG/24 HOURS) 21 mg/24hr patch Place 1 patch (21 mg total) onto the skin daily.  . nortriptyline (PAMELOR) 10 MG capsule Take 1 capsule (10 mg total) by mouth at bedtime.  . pindolol (VISKEN) 5 MG tablet Take 0.5 tablets (2.5 mg total) by mouth 2 (two) times daily.  Marland Kitchen tiotropium (SPIRIVA) 18 MCG inhalation capsule Place 1 capsule (18 mcg total) into inhaler and inhale  daily.  . torsemide (DEMADEX) 20 MG tablet Take 1 tablet by mouth daily, you may take 1 extra tablet daily by mouth only as needed  . triamcinolone (KENALOG) 0.025 % ointment Apply 1 application topically 2 (two) times daily.     Allergies:   Patient has no known allergies.   Social History   Socioeconomic History  . Marital status: Divorced    Spouse name: Not on file  . Number of children: 1  . Years of education: 21  . Highest education level: 12th grade  Occupational History  . Occupation: Psychologist, educational    Comment: disabled  . Occupation: patient care assistance  Social Needs  . Financial resource strain: Not hard at all  . Food insecurity    Worry: Sometimes true    Inability: Sometimes true  . Transportation needs    Medical: Yes    Non-medical: Yes  Tobacco Use  . Smoking status: Current Every Day Smoker    Packs/day: 1.00    Years: 42.00    Pack years: 42.00    Types: Cigarettes    Start date: 10/20/1976  . Smokeless tobacco: Never Used  Substance and Sexual Activity  . Alcohol use: Yes     Comment: very occasional  . Drug use: No    Types: "Crack" cocaine    Comment: 03/30/2014 "stopped using crack 02/21/2014"  . Sexual activity: Yes    Birth control/protection: Post-menopausal  Lifestyle  . Physical activity    Days per week: 0 days    Minutes per session: 0 min  . Stress: Not at all  Relationships  . Social connections    Talks on phone: More than three times a week    Gets together: Never    Attends religious service: More than 4 times per year    Active member of club or organization: No    Attends meetings of clubs or organizations: Never    Relationship status: Divorced  Other Topics Concern  . Not on file  Social History Narrative   Divorced since 2015.   Lives alone. No pets. Daughter lives in [place]. Daughter helps with transportation. Talks daily on phone, sees once or twice a week. 2 grandkids.   Enjoys church at [employer], [place] geared towards people with substance abuse history.    Lives in duplex apartment, one level, three stairs to get in. Has handrails, has grab bars in bathroom. Smoke alarms.   Bakes most meals, avoids starches, rice. Eats vegetables. Drinks tea, ginger ale.    Likes to go to movies, used to walk daily but not now due to knee pain. Likes to spend time with grandchildren, go out to eat. Spend time with family and circle of friends.      Family History: The patient's family history includes COPD in her mother and sister; Cancer in her sister; Hypertension in her father, mother, sister, and sister.  ROS:   Please see the history of present illness.     All other systems reviewed and are negative.  EKGs/Labs/Other Studies Reviewed:    The following studies were reviewed today: Echocardiogram 10/23/2016: Severe LVH EF 32% diastolic dysfunction  VQ scan 12/02/2016-low probability  EKG:   04/07/2019-sinus rhythm 80-previous sinus rhythm 74 with poor R wave progression otherwise unremarkable.  No significant change from  prior personally reviewed and interpreted.  Recent Labs: 03/31/2019: Magnesium 1.9 04/11/2019: TSH 3.260 05/10/2019: ALT 17; BUN 16; Creatinine, Ser 1.22; Hemoglobin 16.8; Platelets 236; Potassium 3.8; Sodium  142  Recent Lipid Panel    Component Value Date/Time   CHOL 152 09/20/2018 1116   TRIG 179 (H) 09/20/2018 1116   HDL 43 09/20/2018 1116   CHOLHDL 3.5 09/20/2018 1116   CHOLHDL 4.1 07/02/2017 0652   VLDL 23 07/02/2017 0652   LDLCALC 73 09/20/2018 1116    Physical Exam:    VS:  BP 140/80   Pulse 80   Ht 5' 3"  (1.6 m)   Wt 263 lb (119.3 kg)   LMP 04/06/2017   SpO2 95%   BMI 46.59 kg/m     Wt Readings from Last 3 Encounters:  07/05/19 263 lb (119.3 kg)  05/27/19 262 lb 4.8 oz (119 kg)  05/10/19 260 lb (117.9 kg)     GEN: obese Well nourished, well developed in no acute distress HEENT: Normal NECK: No JVD; No carotid bruits LYMPHATICS: No lymphadenopathy CARDIAC: RRR, no murmurs, rubs, gallops RESPIRATORY:  Clear to auscultation without rales, wheezing or rhonchi  ABDOMEN: Soft, non-tender, non-distended MUSCULOSKELETAL:  No edema; No deformity  SKIN: Warm and dry NEUROLOGIC:  Alert and oriented x 3 PSYCHIATRIC:  Normal affect   ASSESSMENT:    1. Chronic diastolic CHF (congestive heart failure) ([place])   2. Morbid obesity, unspecified obesity type ([place])   3. Hypertensive heart failure ([place])    PLAN:    In order of problems listed above:   Atypical chest pain- no further issue at this time - Previously described surrounding left breast, shooting sharp discomfort protruding forward.  Fleeting.  No radiation.  By description, sounds more musculoskeletal.  She would not be a good candidate for nuclear imaging given body habitus.  Based upon the atypical nature of her symptoms and unremarkable ECG, I would not recommend cardiac catheterization or angiogram unless significant objective evidence was present.  Risks would outweigh the benefits.  Chronic diastolic heart  failure with LVH - No change with torsemide.  Fluid restriction 1.5 L daily.  Creatinine has ranged from 1.1-1.3.  Overall doing well.  Continue with weight loss and exercise.  Prior history of stroke - Continue with statin, aspirin, blood pressure control.  LDL previously 68-73.  Excellent.  Morbid obesity - Had been involved with the [employer] in the past.  Weight had decreased.  Excellent.  She has been referred to the bariatric clinic.  She seems motivated to consider the gastric sleeve.  She would not be a candidate for knee replacement at this weight.  Asked about this 1 more time.  Hypertensive heart disease with heart failure - Blood pressure being managed very well.  Continue with fluid balance.  Weight loss.  No changes  Diabetes with hypertension - Prior hemoglobin A1c 14?   continue to work on this.  Reassured her about metformin and lisinopril.  Tobacco use -Continue to encourage cessation.  We will have her come back in in 6 months to see Mickel Baas and 12 months to see me.  Medication Adjustments/Labs and Tests Ordered: Current medicines are reviewed at length with the patient today.  Concerns regarding medicines are outlined above.  No orders of the defined types were placed in this encounter.  No orders of the defined types were placed in this encounter.   Patient Instructions  Medication Instructions:  The current medical regimen is effective;  continue present plan and medications.  If you need a refill on your cardiac medications before your next appointment, please call your pharmacy.   Follow-Up: At [facility], you and your health needs  are our priority.  As part of our continuing mission to provide you with exceptional heart care, we have created designated Provider Care Teams.  These Care Teams include your primary Cardiologist (physician) and Advanced Practice Providers (APPs -  Physician Assistants and Nurse Practitioners) who all work together to provide you  with the care you need, when you need it. You will need a follow up appointment in 6 months with [name], NP and Dr Marlou Porch in 1 year.  Please call our office 2 months in advance to schedule this appointment.  You may see Candee Furbish, MD or one of the following Advanced Practice Providers on your designated Care Team:   Truitt Merle, NP [name], NP . Kathyrn Drown, NP  Thank you for choosing [facility]!!         Signed, Candee Furbish, MD  07/05/2019 10:25 AM    [place]

## 2019-07-06 ENCOUNTER — Telehealth: Payer: Self-pay | Admitting: Pharmacist

## 2019-07-06 NOTE — Telephone Encounter (Signed)
Attempted phone call RE Tobacco and Diabetes  Shared that I would try again 9/17

## 2019-07-07 NOTE — Telephone Encounter (Signed)
Attempted phone call RE tobacco cessation and DM  Requested she call me back.

## 2019-07-11 ENCOUNTER — Ambulatory Visit: Payer: Medicare HMO | Admitting: Family Medicine

## 2019-07-11 NOTE — Progress Notes (Deleted)
  Subjective:   Patient ID: Hailey Barnes    DOB: January 03, 1961, 58 y.o. female   MRN: [id number]  Hailey Barnes is a 58 y.o. female with a history of chs, HTN, COPD, OSA, [name], type 2 diabetes, OA, depression, HLD, morbid obesity, tobacco use here for   Diabetes, Type 2 - Last A1c 14.5 02/2019 - Medications: jardiance 73m daily, victoza 1.212mdaily, metformin 100087mID - Compliance: *** - Checking BG at home: *** - Diet: *** - Exercise: *** - Eye exam: *** - Foot exam: *** - Microalbumin: *** - Statin: *** - Denies symptoms of hypoglycemia, polyuria, polydipsia, numbness extremities, foot ulcers/trauma  Tobacco cessation - started on bupropion 150m44mily at recent pharmacy appt 8/24. - ***  Review of Systems:  Per HPI.  Medications and smoking status reviewed.  Objective:   LMP 04/06/2017  Vitals and nursing note reviewed.  General: well nourished, well developed, in no acute distress with non-toxic appearance HEENT: normocephalic, atraumatic, moist mucous membranes Neck: supple, non-tender without lymphadenopathy CV: regular rate and rhythm without murmurs, rubs, or gallops, no lower extremity edema Lungs: clear to auscultation bilaterally with normal work of breathing Abdomen: soft, non-tender, non-distended, no masses or organomegaly palpable, normoactive bowel sounds Skin: warm, dry, no rashes or lesions Extremities: warm and well perfused, normal tone MSK: ROM grossly intact, gait normal Neuro: Alert and oriented, speech normal  Assessment & Plan:   No problem-specific Assessment & Plan notes found for this encounter.  No orders of the defined types were placed in this encounter.  No orders of the defined types were placed in this encounter.   AlisRory Percy PGY-3, [facility] 07/11/2019 8:33 AM

## 2019-07-14 ENCOUNTER — Telehealth: Payer: Self-pay | Admitting: Pharmacist

## 2019-07-14 MED ORDER — BUPROPION HCL ER (XL) 300 MG PO TB24: 300.0000 mg | ORAL_TABLET | Freq: Every day | ORAL | 2 refills | Status: DC

## 2019-07-14 NOTE — Telephone Encounter (Signed)
Contacted patient to follow-up on DM and Tobacco Cessation   She apologized for missing appointment on Monday and also not picking up/answering phone calls recently.  She noted that she has been dealing with stressors including a death in the family.   She reports "good" blood sugar control.    She reports adherence with bupropion 122m (2 daily). We agreed that the 3068mXL should be the next Rx filled.  Intake of cigarettes ~ 10-12 per day and her goal is to reduce more next week.   Consider stack therapy if needed at next visit.

## 2019-07-14 NOTE — Telephone Encounter (Signed)
-----   Message from Leavy Cella, [facility] sent at 07/07/2019 10:18 AM EDT ----- Regarding: Tobacco and DM follow-up

## 2019-07-14 NOTE — Telephone Encounter (Signed)
Noted and agree. 

## 2019-07-19 ENCOUNTER — Encounter: Payer: Self-pay | Admitting: Gynecology

## 2019-07-26 ENCOUNTER — Other Ambulatory Visit (HOSPITAL_COMMUNITY)
Admission: RE | Admit: 2019-07-26 | Discharge: 2019-07-26 | Disposition: A | Discharge status: Home / Self Care | Payer: Medicare HMO | Source: Ambulatory Visit | Attending: Family Medicine | Admitting: Family Medicine

## 2019-07-26 ENCOUNTER — Other Ambulatory Visit: Payer: Self-pay

## 2019-07-26 ENCOUNTER — Ambulatory Visit (INDEPENDENT_AMBULATORY_CARE_PROVIDER_SITE_OTHER): Payer: Medicare HMO | Admitting: Family Medicine

## 2019-07-26 ENCOUNTER — Encounter: Payer: Self-pay | Admitting: Family Medicine

## 2019-07-26 VITALS — BP 130/90 | HR 81 | Wt 266.0 lb

## 2019-07-26 DIAGNOSIS — N898 Other specified noninflammatory disorders of vagina: Secondary | ICD-10-CM

## 2019-07-26 DIAGNOSIS — E119 Type 2 diabetes mellitus without complications: Secondary | ICD-10-CM

## 2019-07-26 LAB — POCT WET PREP (WET MOUNT)
Clue Cells Wet Prep Whiff POC: NEGATIVE
Trichomonas Wet Prep HPF POC: ABSENT

## 2019-07-26 LAB — POCT GLYCOSYLATED HEMOGLOBIN (HGB A1C): HbA1c, POC (controlled diabetic range): 6.3 % (ref 0.0–7.0)

## 2019-07-26 NOTE — Patient Instructions (Addendum)
[place]   It was great seeing you today!   - Stop by the grocery store and pick up Good Belly or Yogurt - Great job on managing your diabetes!!!! Keep it up!     Please check-out at the front desk before leaving the clinic. I'd like to see you back 3 months but if you need to be seen earlier than that for any new issues we're happy to fit you in, just give [place] a call!  We are checking some labs today. If results require attention, either myself or my nurse will get in touch with you. If everything is normal, you will get a letter in the mail or a message in My Chart. Please give [place] a call if you do not hear from [place] after 2 weeks.  Please bring all of your medications with you to each visit.    Sign up for My Chart to have easy access to your labs results, and communication with your primary care physician.  Feel free to call with any questions or concerns at any time, at [phone].   Take care,  Dr. Rushie [facility]

## 2019-07-26 NOTE — Assessment & Plan Note (Addendum)
A1C 6.3 today.  Previous A1C 14.5.  Patient has made significant dietary changes. No changes to medication regimen at this time. Checks her CGBs twice a week. Patient tolerating Victoza well.  Obtain BMP today. Praised patient for positive actions regarding her health and need to continue making healthy habits.  Patient would like to decrease the amount of medication she is taking.  Counseled on appropriate weight loss via diet and exercise. Recent eye exam and foot exam.  Medications: Jardiance 10 mg in morning, metformin 1000 mg twice daily, Victoza in the morning, Lipitor 40 mg  Follow up in 3 months.

## 2019-07-26 NOTE — Progress Notes (Signed)
Subjective:  Hailey Barnes is a 58 y.o. female who presents to the [facility] today with a chief complaint of  Vaginal itching and diabetes follow up.  HPI:  Hailey Barnes is here for follow and with concerns for vaginal itching.   Diabetes Mellitus  Last A1c 14.5. Here for recheck today.  Stopped drinking sodas, decreased portions of starchy foods, eating more vegetables and leaner meats. Better apetite and gets hungry whereas before she was not. Denies missing any doses of DM medications and takes Jaundice, Victoza and Metoformin. Eats 3 meals a day and 1 snack. Eats fruit, peanut butter and crackers for snacks.  Checks glucose with glucometer 2 times a week. Glucose at home 120s-130s. Endorses increased thirst, tremulous when she doesn't eat for a few hours and hunger. Denies frequent urination.   Vaginal Itching  Started a week and a half ago. Has had similar symptoms previously and was treated with steroids and Fluconazole. Had unprotected sexual encounter about a month ago. Denies new vaginal discharge, dysuria, rashes, odors, vaginal pain, vaginal bleeding, nausea, vomiting and abdominal pain.   [place], medications and smoking status reviewed.  ROS: See HPI  Objective:  Physical Exam: BP 130/90   Pulse 81   Wt 266 lb (120.7 kg)   LMP 04/06/2017   SpO2 97%   BMI 47.12 kg/m    GEN: pleasant, in no acute distress  CV: regular rate and rhythm, no murmurs appreciated  RESP: no increased work of breathing, clear to ascultation bilaterally ABD: Bowel sounds present. Soft, Nontender, Nondistended. No palpable masses, no CVA tenderness GU: no vulvar lesions or rashes, scant white discharge, no vaginal tears, pale vaginal mucosa, lesions, no cervical motion tenderness, no adnexal masses  MSK: no edema, or cyanosis noted SKIN: warm, dry PSYCH: Normal affect and thought content   Results for orders placed or performed in visit on 07/26/19 (from the past 72 hour(s))  HgB  A1c     Status: None   Collection Time: 07/26/19 10:11 AM  Result Value Ref Range   Hemoglobin A1C     HbA1c POC (<> result, manual entry)     HbA1c, POC (prediabetic range)     HbA1c, POC (controlled diabetic range) 6.3 0.0 - 7.0 %  POCT Wet Prep Dorette Grate)     Status: Abnormal   Collection Time: 07/26/19 10:40 AM  Result Value Ref Range   Source Wet Prep POC VAG    WBC, Wet Prep HPF POC OCCASIONAL    Bacteria Wet Prep HPF POC Many (A) Few   Clue Cells Wet Prep HPF POC None None   Clue Cells Wet Prep Whiff POC Negative Whiff    Yeast Wet Prep HPF POC None None   Trichomonas Wet Prep HPF POC Absent Absent     Assessment/Plan:  T2DM (type 2 diabetes mellitus) (HCC) A1C 6.3 today.  Previous A1C 14.5.  Patient has made significant dietary changes. No changes to medication regimen at this time. Checks her CGBs twice a week. Patient tolerating Victoza well.  Obtain BMP today. Praised patient for positive actions regarding her health and need to continue making healthy habits.  Patient would like to decrease the amount of medication she is taking.  Counseled on appropriate weight loss via diet and exercise. Recent eye exam and foot exam.  Medications: Jardiance 10 mg in morning, metformin 1000 mg twice daily, Victoza in the morning, Lipitor 40 mg  Follow up in 3 months.   Vaginal itching  Wet prep negative for trichomonas, BV and yeast. Etiology possibly due to atrophic vaginitis or bacterial flora disruption. Patient has been washing internal and external vaginal canal.  Counseled patient on causes of bacterial flora disruptions and to externally wash only.  Recommended gentle cleanser and probiotics. Patient with unprotected sexual intercourse about a month ago.  GC/Chlamydia collected.        Orders Placed This Encounter  Procedures  . Basic Metabolic Panel  . HgB A1c  . POCT Wet Prep [facility])     No orders of the defined types were placed in this encounter.   Health  Maintenance reviewed - Mammogram and PAP are UTD.  Lyndee Hensen, DO PGY-1, [facility] 07/26/2019 12:50 PM

## 2019-07-26 NOTE — Assessment & Plan Note (Signed)
Wet prep negative for trichomonas, BV and yeast. Etiology possibly due to atrophic vaginitis or bacterial flora disruption. Patient has been washing internal and external vaginal canal.  Counseled patient on causes of bacterial flora disruptions and to externally wash only.  Recommended gentle cleanser and probiotics. Patient with unprotected sexual intercourse about a month ago.  GC/Chlamydia collected.

## 2019-07-27 LAB — BASIC METABOLIC PANEL
BUN/Creatinine Ratio: 14 (ref 9–23)
BUN: 15 mg/dL (ref 6–24)
CO2: 24 mmol/L (ref 20–29)
Calcium: 10 mg/dL (ref 8.7–10.2)
Chloride: 100 mmol/L (ref 96–106)
Creatinine, Ser: 1.06 mg/dL — ABNORMAL HIGH (ref 0.57–1.00)
GFR calc Af Amer: 68 mL/min/{1.73_m2} (ref >59)
GFR calc non Af Amer: 59 mL/min/{1.73_m2} — ABNORMAL LOW (ref >59)
Glucose: 102 mg/dL — ABNORMAL HIGH (ref 65–99)
Potassium: 3.9 mmol/L (ref 3.5–5.2)
Sodium: 139 mmol/L (ref 134–144)

## 2019-07-28 ENCOUNTER — Encounter (HOSPITAL_COMMUNITY): Payer: Self-pay | Admitting: Emergency Medicine

## 2019-07-28 ENCOUNTER — Other Ambulatory Visit: Payer: Self-pay

## 2019-07-28 ENCOUNTER — Encounter: Payer: Self-pay | Admitting: Gastroenterology

## 2019-07-28 ENCOUNTER — Other Ambulatory Visit (INDEPENDENT_AMBULATORY_CARE_PROVIDER_SITE_OTHER): Payer: Medicare HMO

## 2019-07-28 ENCOUNTER — Telehealth: Payer: Self-pay | Admitting: Family Medicine

## 2019-07-28 ENCOUNTER — Ambulatory Visit: Payer: Medicare HMO | Admitting: Gastroenterology

## 2019-07-28 ENCOUNTER — Emergency Department (HOSPITAL_COMMUNITY)
Admission: EM | Admit: 2019-07-28 | Discharge: 2019-07-28 | Disposition: A | Discharge status: Home / Self Care | Payer: Medicare HMO | Attending: Emergency Medicine | Admitting: Emergency Medicine

## 2019-07-28 VITALS — BP 132/90 | HR 88 | Temp 97.5°F | Ht 62.5 in | Wt 261.0 lb

## 2019-07-28 DIAGNOSIS — K219 Gastro-esophageal reflux disease without esophagitis: Secondary | ICD-10-CM | POA: Diagnosis not present

## 2019-07-28 DIAGNOSIS — K5900 Constipation, unspecified: Secondary | ICD-10-CM

## 2019-07-28 DIAGNOSIS — Z5321 Procedure and treatment not carried out due to patient leaving prior to being seen by health care provider: Secondary | ICD-10-CM | POA: Insufficient documentation

## 2019-07-28 DIAGNOSIS — R1010 Upper abdominal pain, unspecified: Secondary | ICD-10-CM

## 2019-07-28 DIAGNOSIS — R109 Unspecified abdominal pain: Secondary | ICD-10-CM | POA: Diagnosis present

## 2019-07-28 LAB — LIPASE, BLOOD: Lipase: 199 U/L — ABNORMAL HIGH (ref 11–51)

## 2019-07-28 LAB — CBC
HCT: 50.4 % — ABNORMAL HIGH (ref 36.0–46.0)
Hemoglobin: 16.4 g/dL — ABNORMAL HIGH (ref 12.0–15.0)
MCH: 27.7 pg (ref 26.0–34.0)
MCHC: 32.5 g/dL (ref 30.0–36.0)
MCV: 85.3 fL (ref 80.0–100.0)
Platelets: 265 10*3/uL (ref 150–400)
RBC: 5.91 MIL/uL — ABNORMAL HIGH (ref 3.87–5.11)
RDW: 15.8 % — ABNORMAL HIGH (ref 11.5–15.5)
WBC: 13.5 10*3/uL — ABNORMAL HIGH (ref 4.0–10.5)
nRBC: 0 % (ref 0.0–0.2)

## 2019-07-28 LAB — COMPREHENSIVE METABOLIC PANEL
ALT: 13 U/L (ref 0–35)
ALT: 15 U/L (ref 0–44)
AST: 12 U/L (ref 0–37)
AST: 14 U/L — ABNORMAL LOW (ref 15–41)
Albumin: 4.2 g/dL (ref 3.5–5.2)
Albumin: 4.1 g/dL (ref 3.5–5.0)
Alkaline Phosphatase: 109 U/L (ref 39–117)
Alkaline Phosphatase: 104 U/L (ref 38–126)
Anion gap: 11 (ref 5–15)
BUN: 13 mg/dL (ref 6–23)
BUN: 15 mg/dL (ref 6–20)
CO2: 28 mEq/L (ref 19–32)
CO2: 24 mmol/L (ref 22–32)
Calcium: 9.9 mg/dL (ref 8.4–10.5)
Calcium: 9.5 mg/dL (ref 8.9–10.3)
Chloride: 99 mEq/L (ref 96–112)
Chloride: 101 mmol/L (ref 98–111)
Creatinine, Ser: 1.06 mg/dL (ref 0.40–1.20)
Creatinine, Ser: 1.05 mg/dL — ABNORMAL HIGH (ref 0.44–1.00)
GFR calc Af Amer: >60 mL/min (ref >60)
GFR calc non Af Amer: 59 mL/min — ABNORMAL LOW (ref >60)
GFR: 64.79 mL/min (ref >60.00)
Glucose, Bld: 92 mg/dL (ref 70–99)
Glucose, Bld: 94 mg/dL (ref 70–99)
Potassium: 3.5 mEq/L (ref 3.5–5.1)
Potassium: 3.5 mmol/L (ref 3.5–5.1)
Sodium: 136 mEq/L (ref 135–145)
Sodium: 136 mmol/L (ref 135–145)
Total Bilirubin: 0.4 mg/dL (ref 0.2–1.2)
Total Bilirubin: 0.4 mg/dL (ref 0.3–1.2)
Total Protein: 7.7 g/dL (ref 6.0–8.3)
Total Protein: 7.7 g/dL (ref 6.5–8.1)

## 2019-07-28 LAB — CBC WITH DIFFERENTIAL/PLATELET
Absolute Monocytes: 1208 cells/uL — ABNORMAL HIGH (ref 200–950)
Basophils Absolute: 45 cells/uL (ref 0–200)
Basophils Relative: 0.3 %
Eosinophils Absolute: 121 cells/uL (ref 15–500)
Eosinophils Relative: 0.8 %
HCT: 48.8 % — ABNORMAL HIGH (ref 35.0–45.0)
Hemoglobin: 16.7 g/dL — ABNORMAL HIGH (ref 11.7–15.5)
Lymphs Abs: 3247 cells/uL (ref 850–3900)
MCH: 28.2 pg (ref 27.0–33.0)
MCHC: 34.2 g/dL (ref 32.0–36.0)
MCV: 82.4 fL (ref 80.0–100.0)
MPV: 10.8 fL (ref 7.5–12.5)
Monocytes Relative: 8 %
Neutro Abs: 10479 cells/uL — ABNORMAL HIGH (ref 1500–7800)
Neutrophils Relative %: 69.4 %
Platelets: 287 10*3/uL (ref 140–400)
RBC: 5.92 10*6/uL — ABNORMAL HIGH (ref 3.80–5.10)
RDW: 15.6 % — ABNORMAL HIGH (ref 11.0–15.0)
Total Lymphocyte: 21.5 %
WBC: 15.1 10*3/uL — ABNORMAL HIGH (ref 3.8–10.8)

## 2019-07-28 LAB — CERVICOVAGINAL ANCILLARY ONLY
Chlamydia: NEGATIVE
Comment: NEGATIVE
Comment: NORMAL
Neisseria Gonorrhea: NEGATIVE

## 2019-07-28 LAB — LIPASE: Lipase: 546 U/L — ABNORMAL HIGH (ref 11.0–59.0)

## 2019-07-28 MED ORDER — OMEPRAZOLE 40 MG PO CPDR: 40.0000 mg | DELAYED_RELEASE_CAPSULE | Freq: Every day | ORAL | 3 refills | Status: DC

## 2019-07-28 MED ORDER — SODIUM CHLORIDE 0.9 % IV BOLUS: 2000.0000 mL | Freq: Once | INTRAVENOUS | Status: DC

## 2019-07-28 NOTE — ED Triage Notes (Signed)
Patient reports she was seen at PCP this morning for abdominal pain. States blood was drawn and she was told she had pancreatitis and to come to the ED for further evaluation. Denies N/V/D.

## 2019-07-28 NOTE — Patient Instructions (Signed)
Your provider has requested that you go to the basement level for lab work before leaving today. Press "B" on the elevator. The lab is located at the first door on the left as you exit the elevator.  We will contact you regarding your labwork as soon as we receive this. Please keep your phone close by!  We have sent the following medications to your pharmacy for you to pick up at your convenience: Omeprazole 40 mg daily.  Please purchase the following medications over the counter and take as directed: Miralax 17 grams dissolved in at least 8 ounces water/juice once daily as needed for constipation.  If you are age 9 or older, your body mass index should be between 23-30. Your Body mass index is 46.98 kg/m. If this is out of the aforementioned range listed, please consider follow up with your Primary Care Provider.  If you are age 59 or younger, your body mass index should be between 19-25. Your Body mass index is 46.98 kg/m. If this is out of the aformentioned range listed, please consider follow up with your Primary Care Provider.

## 2019-07-28 NOTE — ED Notes (Signed)
Patient called for room placement x3 with no answer. 

## 2019-07-28 NOTE — ED Notes (Signed)
OBSERVED PT GOING OUTSIDE OF WAITING LOBBY.

## 2019-07-28 NOTE — ED Notes (Signed)
PT RETURNED FROM OUTSIDE. WALKING IN LOBBY. GAIT STEADY. AMBULATING WITHOUT DIFFICULTY.

## 2019-07-28 NOTE — ED Notes (Signed)
OBSERVED PT EATING A FAST FOOD HAMBURGER.

## 2019-07-28 NOTE — Progress Notes (Signed)
HPI :  58 y/o female with a history ofobesity,CVA, post cholecystectomy, here for a follow up visit.   She has been followed with me in the past for right upper quadrant pain.  She previously had an EGD which was unremarkable for cause in 2018, she had a right upper quadrant ultrasound showing an 8 mm gallbladder polyp without stones.  Her HIDA scan was normal.  Given her persistent right upper quadrant pain she underwent cholecystectomy with Dr. Georgette Dover in 2018.  She states that appeared to resolve her prior right upper quadrant pain.  I saw her last year for some intermittent right upper quadrant pain that felt like spasms and was generally mild.  She had also had some loose stools at the time.  I thought she likely had neuropathic or musculoskeletal pain following her cholecystectomy.  She states those mild right upper quadrant spasms have generally resolved.  She may feel one every few weeks that is very short-lived and does not bother her.  Generall she is been doing well from that perspective.  More acutely she is developed severe upper abdominal to right upper quadrant to right flank pain for the past 2 days or so.  She had acute onset of pain without a clear trigger.  Rated 10 out of 10.  Since that time she has been in quite a bit of discomfort although decreased to 8 out of 10 today.  She denies any nausea or vomiting but has had worsening appetite.  She thinks eating makes her feel worse.  She states more recently she has been constipated having a bowel movement every 2 to 3 days, previously she was having 3-4 bowel movements a day after her cholecystectomy.  We gave her a trial of Colestid but she stopped it.  She has been on Jardiance and Victoza for the past 2 to 3 months and states when she has been on the regimen it had caused her constipation.  More recently she is had reflux symptoms that has been bothering her as well, she is had some pyrosis bothering her.  She denies any dysphagia.   She has not been using any NSAIDs.  She has been taking low-dose nortriptyline for sleep which has helped her.  She has not been taking anything for her bowels.  She denies any alcohol use.  Prior workup: EGD 04/28/2017 - normal, biopsies obtained - biopsies negative for HP Colonoscopy 04/28/2017 - diverticulosis, 17m polyp - sessile serrated, recall 5 years RUQ [place]6/1/18 - 861mGB polyp, no stones, normal CBD, fatty liver H pylori breath test negative 03/18/17 HIDA 12/04/16 - normal [place]domen 12/02/16 - no gallstones or polyps noted, fatty liver CT abdomen / pelvis 06/01/15 - ? Avascular necrosis of femoral head, adrenal adenoma, fatty liver  RUQ [place]/28/2019 - fatty liver, s/p cholecystectomy, normal CBD  Labs 05/10/19 - LFTs normal,       Past Medical History:  Diagnosis Date  . Arthritis    "knees" (02/21/2014), back  . CHF (congestive heart failure) ([facility]  . Chronic bronchitis ([facility]   "get it q yr" (02/21/2014)  . Chronic diastolic CHF (congestive heart failure) ([facility]   a. Echo 10/23/2016: EF 75 %  . Chronic lower back pain   . Complication of anesthesia    "I don't come out well; I chew on my tongue"  . COPD (chronic obstructive pulmonary disease) ([facility]  . Diabetes mellitus without complication ([facility]   TYPE 2  .  Family history of adverse reaction to anesthesia    BROTHER PONV, had a blood clot several days later and heart stopped  . Fatty liver    NONALCOLIC  . Fibroid   . Heart murmur   . Hypertension   . Infection of right eye    current dx on Saturday 7/15 - using drops  . Leg swelling    bilateral  . Migraine 1982-2009  . MVP (mitral valve prolapse)   . Neuromuscular disorder ([place])    right leg nerve pain  . Shortness of breath    WITH EXERTION  . Sinus pause    a. noted on telemetry during admission from 10/2016, lasting up to 4.4 seconds. BB discontinued.   . Sleep apnea 02/2016   CPAP 16 TO 20  . Stroke [facility]) noted on CAT 02/2014   "light", LLE weakness  remains (03/30/2014)  . 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 Tingling in extremities 12/12/2010   Uric acid and electrolytes (02/23) normal but WBC elevated.   D/Dx: carpal tunnel, ulnar neuropathy Less likely: cervical radiculopathy, vasculitis      Past Surgical History:  Procedure Laterality Date  . [place]  . CHOLECYSTECTOMY N/A 06/17/2017   Procedure: LAPAROSCOPIC CHOLECYSTECTOMY WITH INTRAOPERATIVE CHOLANGIOGRAM;  Surgeon: Donnie Mesa, MD;  Location: [place] [facility];  Service: General;  Laterality: N/A;  . COLONOSCOPY WITH PROPOFOL N/A 04/28/2017   Procedure: COLONOSCOPY WITH PROPOFOL;  Surgeon: Manus Gunning, MD;  Location: [facility];  Service: Gastroenterology;  Laterality: N/A;  . DILATATION & CURETTAGE/HYSTEROSCOPY WITH MYOSURE N/A 05/06/2016   Procedure: DILATATION & CURETTAGE/HYSTEROSCOPY WITH MYOSURE;  Surgeon: Terrance Mass, MD;  Location: [facility];  Service: Gynecology;  Laterality: N/A;  request to follow around 8:45  requests one hour OR time  . DILATATION & CURETTAGE/HYSTEROSCOPY WITH MYOSURE N/A 04/03/2017   Procedure: DILATATION & CURETTAGE/HYSTEROSCOPY WITH MYOSURE;  Surgeon: Terrance Mass, MD;  Location: [facility];  Service: Gynecology;  Laterality: N/A;  . DILATION AND CURETTAGE OF UTERUS    . ESOPHAGOGASTRODUODENOSCOPY (EGD) WITH PROPOFOL N/A 04/28/2017   Procedure: ESOPHAGOGASTRODUODENOSCOPY (EGD) WITH PROPOFOL;  Surgeon: Manus Gunning, MD;  Location: [facility];  Service: Gastroenterology;  Laterality: N/A;  . FOOT SURGERY Bilateral    "took bones out; put pins in"  . KNEE ARTHROSCOPY Left 08/16/2018   Procedure: ARTHROSCOPY KNEE PARTIAL MEDIAL MENISECTOMY, CHONDROPLASTY MEDIAL AND LATERAL, PLICA RELEASE MEDIAL;  Surgeon: Dorna Leitz, MD;  Location: [facility];  Service: Orthopedics;  Laterality: Left;  . LEFT AND RIGHT HEART CATHETERIZATION WITH CORONARY ANGIOGRAM N/A 03/31/2014    Procedure: LEFT AND RIGHT HEART CATHETERIZATION WITH CORONARY ANGIOGRAM;  Surgeon: Birdie Riddle, MD;  Location: [facility];  Service: Cardiovascular;  Laterality: N/A;  . MYOMECTOMY  YRS AGO  . sonohystogram  04/14/2016   [facility]: intramural fibroid, premenopausal endometrium, right fluid filled tubular structure   Family History  Problem Relation Age of Onset  . Hypertension Mother   . COPD Mother   . Hypertension Father   . Cancer Sister        UTERINE???  . COPD Sister   . Hypertension Sister   . Hypertension Sister    Social History   Tobacco Use  . Smoking status: Current Every Day Smoker    Packs/day: 1.00    Years: 42.00    Pack years: 42.00    Types: Cigarettes  Start date: 10/20/1976  . Smokeless tobacco: Never Used  Substance Use Topics  . Alcohol use: Yes    Comment: very occasional  . Drug use: No    Types: "Crack" cocaine    Comment: 03/30/2014 "stopped using crack 02/21/2014"   Current Outpatient Medications  Medication Sig Dispense Refill  . allopurinol (ZYLOPRIM) 300 MG tablet Take 1 tablet (300 mg total) by mouth daily. 30 tablet 3  . atorvastatin (LIPITOR) 40 MG tablet Take 40 mg by mouth once a day 90 tablet 3  . Blood Glucose Monitoring Suppl (ACCU-CHEK AVIVA) device Use as instructed 1 each 0  . buPROPion (WELLBUTRIN XL) 300 MG 24 hr tablet Take 1 tablet (300 mg total) by mouth daily. 60 tablet 2  . Cholecalciferol (VITAMIN D3) 125 MCG (5000 UT) CAPS Take 1 capsule by mouth daily.    . empagliflozin (JARDIANCE) 25 MG TABS tablet Take 25 mg by mouth daily. 90 tablet 3  . fluticasone furoate-vilanterol (BREO ELLIPTA) 200-25 MCG/INH AEPB Inhale 1 puff into the lungs daily. 60 each 5  . glucose blood (ACCU-CHEK AVIVA) test strip Use to check sugar up to 3 times daily 100 each 12  . Insulin Pen Needle (PEN NEEDLES) 32G X 4 MM MISC 1 each by Does not apply route 3 (three) times daily. 200 each 4  . KLOR-CON M20 20 MEQ tablet TAKE 1  TABLET BY MOUTH EVERY DAY 90 tablet 0  . Lancets (ACCU-CHEK SOFT TOUCH) lancets Use as instructed 100 each 12  . liraglutide (VICTOZA) 18 MG/3ML SOPN Inject 0.2 mLs (1.2 mg total) into the skin daily. 2 pen 0  . lisinopril (PRINIVIL,ZESTRIL) 40 MG tablet TAKE 1 TABLET BY MOUTH EVERY DAY 90 tablet 3  . metFORMIN (GLUCOPHAGE) 1000 MG tablet TAKE 1 TABLET BY MOUTH TWICE A DAY WITH MEALS 180 tablet 1  . nortriptyline (PAMELOR) 10 MG capsule Take 1 capsule (10 mg total) by mouth at bedtime. 90 capsule 1  . pindolol (VISKEN) 5 MG tablet Take 0.5 tablets (2.5 mg total) by mouth 2 (two) times daily. 90 tablet 3  . tiotropium (SPIRIVA) 18 MCG inhalation capsule Place 1 capsule (18 mcg total) into inhaler and inhale daily. 30 capsule 5  . torsemide (DEMADEX) 20 MG tablet Take 1 tablet by mouth daily, you may take 1 extra tablet daily by mouth only as needed 45 tablet 3  . triamcinolone (KENALOG) 0.025 % ointment Apply 1 application topically 2 (two) times daily. 30 g 0   No current facility-administered medications for this visit.    No Known Allergies   Review of Systems: All systems reviewed and negative except where noted in HPI.   Lab Results  Component Value Date   WBC 13.3 (H) 05/10/2019   HGB 16.8 (H) 05/10/2019   HCT 50.4 (H) 05/10/2019   MCV 83 05/10/2019   PLT 236 05/10/2019    Lab Results  Component Value Date   CREATININE 1.06 (H) 07/26/2019   BUN 15 07/26/2019   NA 139 07/26/2019   K 3.9 07/26/2019   CL 100 07/26/2019   CO2 24 07/26/2019    Lab Results  Component Value Date   ALT 17 05/10/2019   AST 20 05/10/2019   GGT 46 03/19/2018   ALKPHOS 90 05/10/2019   BILITOT 0.3 05/10/2019     Physical Exam: BP 132/90 (BP Location: Left Arm, Patient Position: Sitting, Cuff Size: Normal)   Pulse 88   Temp (!) 97.5 F (36.4 C)   Ht 5'  2.5" (1.588 m)   Wt 261 lb (118.4 kg)   LMP 04/06/2017   BMI 46.98 kg/m  Constitutional: Pleasant,well-developed, female in no acute  distress. HEENT: Normocephalic and atraumatic. Conjunctivae are normal. No scleral icterus. Neck supple.  Cardiovascular: Normal rate, regular rhythm.  Pulmonary/chest: Effort normal and breath sounds normal. No wheezing, rales or rhonchi. Abdominal: Soft, nondistended, tenderness to light touch / palpation of the R flank, RUQ, epigastric area.  There are no masses palpable. No hepatomegaly. Extremities: no edema Lymphadenopathy: No cervical adenopathy noted. Neurological: Alert and oriented to person place and time. Skin: Skin is warm and dry. No rashes noted. Psychiatric: Normal mood and affect. Behavior is normal.   ASSESSMENT AND PLAN: 58 year old female here for reassessment of the following:  Upper abdominal pain - patient with a history of periodic right upper quadrant pain post cholecystectomy which has since been quite minimal over the past year, presenting with different type of acute onset severe 10 out of 10 pain in the upper abdomen diffusely radiating to the right upper quadrant / right flank for the past few days.  Pain has abated slightly but she remains quite uncomfortable and is quite tender on exam.  I discussed differential with her.  Recommending labs to make sure she does not have pancreatitis, check her liver enzymes and CBC.  She has had some reflux which is bothering her and we will give her some omeprazole to take once daily to cover for PUD as well.  Victoza can cause upset stomach and abdominal pain however she has been on this a few months and would be an unusual presentation.  Will await her initial labs this morning.  If she has any significant abnormalities such as pancreatitis she will need to go to the emergency room for cross-sectional imaging and evaluation.  If the labs are normal, I think she will still warrant cross-sectional imaging of her abdomen with CT scan in the near future to further evaluate given the new symptoms.  I do not appreciate any rash such as  shingles at this time however she should be mindful of this and keep an eye out for this.  I will call her back with results of labs as I get them today, phone [phone]. She agreed  GERD - worsening reflux symptoms recently, will place on omeprazole 40 mg once a day for a few weeks in light of her symptoms as above.  Constipation - likely medication induced as outlined above, recommend MiraLAX once daily and titrate up as needed to produce 1 bowel movement a day.  Franklin Cellar, MD [facility]

## 2019-07-28 NOTE — ED Notes (Signed)
Patient called for room placement x2 without answer.

## 2019-07-28 NOTE — Addendum Note (Signed)
Addended by: Marzella Schlein on: 07/28/2019 10:25 AM   Modules accepted: Orders

## 2019-07-28 NOTE — Telephone Encounter (Signed)
Called patient to discuss negative GC/CT testing and normal kidney function testing.

## 2019-07-29 ENCOUNTER — Telehealth: Payer: Self-pay | Admitting: Gastroenterology

## 2019-07-29 ENCOUNTER — Other Ambulatory Visit: Payer: Self-pay

## 2019-07-29 ENCOUNTER — Encounter (HOSPITAL_COMMUNITY): Payer: Self-pay

## 2019-07-29 ENCOUNTER — Emergency Department (HOSPITAL_COMMUNITY)
Admission: EM | Admit: 2019-07-29 | Discharge: 2019-07-29 | Disposition: A | Discharge status: Home / Self Care | Payer: Medicare HMO | Attending: Emergency Medicine | Admitting: Emergency Medicine

## 2019-07-29 ENCOUNTER — Emergency Department (HOSPITAL_COMMUNITY): Payer: Medicare HMO

## 2019-07-29 ENCOUNTER — Telehealth: Payer: Self-pay | Admitting: Pharmacist

## 2019-07-29 DIAGNOSIS — Z7984 Long term (current) use of oral hypoglycemic drugs: Secondary | ICD-10-CM | POA: Diagnosis not present

## 2019-07-29 DIAGNOSIS — I509 Heart failure, unspecified: Secondary | ICD-10-CM | POA: Diagnosis not present

## 2019-07-29 DIAGNOSIS — K760 Fatty (change of) liver, not elsewhere classified: Secondary | ICD-10-CM | POA: Diagnosis not present

## 2019-07-29 DIAGNOSIS — E119 Type 2 diabetes mellitus without complications: Secondary | ICD-10-CM | POA: Insufficient documentation

## 2019-07-29 DIAGNOSIS — R109 Unspecified abdominal pain: Secondary | ICD-10-CM | POA: Diagnosis not present

## 2019-07-29 DIAGNOSIS — R748 Abnormal levels of other serum enzymes: Secondary | ICD-10-CM

## 2019-07-29 DIAGNOSIS — R7989 Other specified abnormal findings of blood chemistry: Secondary | ICD-10-CM | POA: Diagnosis present

## 2019-07-29 DIAGNOSIS — Z20828 Contact with and (suspected) exposure to other viral communicable diseases: Secondary | ICD-10-CM | POA: Insufficient documentation

## 2019-07-29 DIAGNOSIS — F17210 Nicotine dependence, cigarettes, uncomplicated: Secondary | ICD-10-CM | POA: Insufficient documentation

## 2019-07-29 DIAGNOSIS — R1013 Epigastric pain: Secondary | ICD-10-CM | POA: Diagnosis not present

## 2019-07-29 DIAGNOSIS — Z79899 Other long term (current) drug therapy: Secondary | ICD-10-CM | POA: Insufficient documentation

## 2019-07-29 DIAGNOSIS — Z03818 Encounter for observation for suspected exposure to other biological agents ruled out: Secondary | ICD-10-CM | POA: Diagnosis not present

## 2019-07-29 DIAGNOSIS — R1011 Right upper quadrant pain: Secondary | ICD-10-CM | POA: Diagnosis not present

## 2019-07-29 DIAGNOSIS — I10 Essential (primary) hypertension: Secondary | ICD-10-CM | POA: Diagnosis not present

## 2019-07-29 DIAGNOSIS — I110 Hypertensive heart disease with heart failure: Secondary | ICD-10-CM | POA: Diagnosis not present

## 2019-07-29 DIAGNOSIS — J449 Chronic obstructive pulmonary disease, unspecified: Secondary | ICD-10-CM | POA: Insufficient documentation

## 2019-07-29 DIAGNOSIS — K429 Umbilical hernia without obstruction or gangrene: Secondary | ICD-10-CM | POA: Diagnosis not present

## 2019-07-29 DIAGNOSIS — R110 Nausea: Secondary | ICD-10-CM | POA: Diagnosis not present

## 2019-07-29 LAB — SARS CORONAVIRUS 2 (TAT 6-24 HRS): SARS Coronavirus 2: NEGATIVE

## 2019-07-29 MED ORDER — ONDANSETRON 8 MG PO TBDP: 8.0000 mg | ORAL_TABLET | Freq: Three times a day (TID) | ORAL | 0 refills | Status: DC | PRN

## 2019-07-29 MED ORDER — MORPHINE SULFATE (PF) 4 MG/ML IV SOLN
8.0000 mg | Freq: Once | INTRAVENOUS | Status: AC
  Administered 2019-07-29: 14:00:00 8 mg via INTRAVENOUS
  Filled 2019-07-29: qty 2

## 2019-07-29 MED ORDER — OMEPRAZOLE 40 MG PO CPDR: 40.0000 mg | DELAYED_RELEASE_CAPSULE | Freq: Every day | ORAL | 1 refills | Status: DC

## 2019-07-29 MED ORDER — HYDROCODONE-ACETAMINOPHEN 5-325 MG PO TABS: 1.0000 | ORAL_TABLET | Freq: Three times a day (TID) | ORAL | 0 refills | Status: AC | PRN

## 2019-07-29 MED ORDER — IOHEXOL 300 MG/ML  SOLN
100.0000 mL | Freq: Once | INTRAMUSCULAR | Status: AC | PRN
  Administered 2019-07-29: 100 mL via INTRAVENOUS

## 2019-07-29 MED ORDER — SODIUM CHLORIDE (PF) 0.9 % IJ SOLN
INTRAMUSCULAR | Status: AC
  Filled 2019-07-29: qty 50

## 2019-07-29 MED ORDER — LACTATED RINGERS IV BOLUS
1000.0000 mL | Freq: Once | INTRAVENOUS | Status: AC
  Administered 2019-07-29: 1000 mL via INTRAVENOUS

## 2019-07-29 MED ORDER — ONDANSETRON HCL 4 MG/2ML IJ SOLN
4.0000 mg | Freq: Once | INTRAMUSCULAR | Status: AC
  Administered 2019-07-29: 4 mg via INTRAVENOUS
  Filled 2019-07-29: qty 2

## 2019-07-29 NOTE — ED Provider Notes (Signed)
[facility] DEPT Provider Note   CSN: [id number] Arrival date & time: 07/29/19  1213     History   Chief Complaint Chief Complaint  Patient presents with   Abnormal Lab   Abdominal Pain    HPI Hailey Barnes is a 58 y.o. female.     HPI  58 year old female with a chief complaint of abnormal labs or abdominal pain.  She has history of COPD, diabetes, CHF.  She is status post cholecystectomy.  She reports that she has been having abdominal pain off and on for the last several days.  Over the past 2 or 3 days patient's pain has been constant and worsening.  She went to GI doctor yesterday and was asked to come to the ER because of elevated lipase and possible need for admission.  Patient's pain is primarily located in the epigastric and right upper quadrant.  There is no associated UTI-like symptoms.  She denies any diarrhea.  Patient has nausea without vomiting.  Past Medical History:  Diagnosis Date   Arthritis    "knees" (02/21/2014), back   CHF (congestive heart failure) ([place])    Chronic bronchitis ([place])    "get it q yr" (02/21/2014)   Chronic diastolic CHF (congestive heart failure) ([place])    a. Echo 10/23/2016: EF 75 %   Chronic lower back pain    Complication of anesthesia    "I don't come out well; I chew on my tongue"   COPD (chronic obstructive pulmonary disease) ([place])    Diabetes mellitus without complication ([place])    TYPE 2   Family history of adverse reaction to anesthesia    BROTHER PONV, had a blood clot several days later and heart stopped   Fatty liver    NONALCOLIC   Fibroid    Heart murmur    Hypertension    Infection of right eye    current dx on Saturday 7/15 - using drops   Leg swelling    bilateral   Migraine 1982-2009   MVP (mitral valve prolapse)    Neuromuscular disorder (HCC)    right leg nerve pain   Shortness of breath    WITH EXERTION   Sinus pause    a. noted on telemetry during  admission from 10/2016, lasting up to 4.4 seconds. BB discontinued.    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   D/Dx: carpal tunnel, ulnar neuropathy Less likely: cervical radiculopathy, vasculitis     Patient Active Problem List   Diagnosis Date Noted   Hyperuricemia 05/10/2019   Foot pain, right 05/06/2019   Nodule of skin of right thumb 04/15/2019   Leg cramps 04/01/2019   Tinea cruris 03/31/2019   Lack of appetite 03/15/2019   Wart on thumb 09/23/2018   Acute medial meniscus tear of left knee 25/95/6387   Plica of knee, left 56/43/3295   Chondromalacia, left knee 08/16/2018   Primary osteoarthritis of both knees 03/24/2018   Primary osteoarthritis of both first carpometacarpal joints 11/16/2017   De Quervain's tenosynovitis, right 11/02/2017   Bilateral carpal tunnel syndrome 10/21/2017   Vaginal itching 09/22/2017   Dysuria 09/22/2017   High risk sexual behavior  08/27/2017   Benign neoplasm of sigmoid colon    Insomnia 02/17/2017   OSA (obstructive sleep apnea) 10/30/2016   Sinus pause 10/23/2016   Panic attack 10/22/2016   Lower extremity edema    Left lumbar radiculopathy 06/02/2016   Hyperlipidemia 02/07/2016   T2DM (type 2 diabetes mellitus) ([place]) 12/07/2015   Occipital lymphadenopathy 09/21/2015   Depression 04/19/2015   COPD exacerbation ([place]) 10/06/2014   Eczema 08/01/2014   Diminished vision 06/23/2014   Chronic diastolic CHF (congestive heart failure) ([place]) 06/13/2014   Restrictive lung disease 05/10/2014   NASH (nonalcoholic steatohepatitis) 01/04/2014   Hypertension 05/26/2010   Morbid obesity ([place]) 12/17/2006   Tobacco use disorder 12/17/2006     Past Surgical History:  Procedure Laterality Date   CESAREAN SECTION  1982   CHOLECYSTECTOMY N/A 06/17/2017   Procedure: LAPAROSCOPIC CHOLECYSTECTOMY WITH INTRAOPERATIVE CHOLANGIOGRAM;  Surgeon: Donnie Mesa, MD;  Location: [place] [facility];  Service: General;  Laterality: N/A;   COLONOSCOPY WITH PROPOFOL N/A 04/28/2017   Procedure: COLONOSCOPY WITH PROPOFOL;  Surgeon: Manus Gunning, MD;  Location: [facility];  Service: Gastroenterology;  Laterality: N/A;   DILATATION & CURETTAGE/HYSTEROSCOPY WITH MYOSURE N/A 05/06/2016   Procedure: DILATATION & CURETTAGE/HYSTEROSCOPY WITH MYOSURE;  Surgeon: Terrance Mass, MD;  Location: [facility];  Service: Gynecology;  Laterality: N/A;  request to follow around 8:45  requests one hour OR time   [place] N/A 04/03/2017   Procedure: [place];  Surgeon: Terrance Mass, MD;  Location: [facility];  Service: Gynecology;  Laterality: N/A;   DILATION AND CURETTAGE OF UTERUS     ESOPHAGOGASTRODUODENOSCOPY (EGD) WITH PROPOFOL N/A 04/28/2017   Procedure: ESOPHAGOGASTRODUODENOSCOPY (EGD) WITH PROPOFOL;  Surgeon: Manus Gunning, MD;  Location: [facility];  Service: Gastroenterology;  Laterality: N/A;   FOOT SURGERY Bilateral    "took bones out; put pins in"   KNEE ARTHROSCOPY Left 08/16/2018   Procedure: ARTHROSCOPY KNEE PARTIAL MEDIAL MENISECTOMY, CHONDROPLASTY MEDIAL AND LATERAL, PLICA RELEASE MEDIAL;  Surgeon: Dorna Leitz, MD;  Location: [facility];  Service: Orthopedics;  Laterality: Left;   LEFT AND RIGHT HEART CATHETERIZATION WITH CORONARY ANGIOGRAM N/A 03/31/2014   Procedure: LEFT AND RIGHT HEART CATHETERIZATION WITH CORONARY ANGIOGRAM;  Surgeon: Birdie Riddle, MD;  Location: [facility];  Service: Cardiovascular;  Laterality: N/A;   MYOMECTOMY  YRS AGO   sonohystogram  04/14/2016    [facility]: intramural fibroid, premenopausal endometrium,  right fluid filled tubular structure     OB History    Gravida  1   Para  1   Term      Preterm      AB      Living  1     SAB      TAB      Ectopic      Multiple      Live Births               Home Medications    Prior to Admission medications   Medication Sig Start Date End Date Taking? Authorizing Provider  allopurinol (ZYLOPRIM) 300 MG tablet Take 1 tablet (300 mg total) by mouth daily. 05/10/19  Yes Silverio Decamp, MD  atorvastatin (LIPITOR) 40 MG tablet Take 40 mg by mouth once a day 04/21/19  Yes Rumball, Bryson Ha, DO  buPROPion (WELLBUTRIN XL) 300 MG 24 hr tablet Take 1 tablet (300 mg total) by mouth daily. 07/14/19  Yes Hensel, Jamal Collin, MD  empagliflozin (JARDIANCE) 25  MG TABS tablet Take 25 mg by mouth daily. 04/11/19  Yes Rumball, Bryson Ha, DO  fluticasone furoate-vilanterol (BREO ELLIPTA) 200-25 MCG/INH AEPB Inhale 1 puff into the lungs daily. 12/17/18  Yes Winfrey, Alcario Drought, MD  KLOR-CON M20 20 MEQ tablet TAKE 1 TABLET BY MOUTH EVERY DAY Patient taking differently: Take 40 mEq by mouth daily.  04/21/19  Yes Rumball, Bryson Ha, DO  liraglutide (VICTOZA) 18 MG/3ML SOPN Inject 0.2 mLs (1.2 mg total) into the skin daily. 06/06/19  Yes Hensel, Jamal Collin, MD  lisinopril (PRINIVIL,ZESTRIL) 40 MG tablet TAKE 1 TABLET BY MOUTH EVERY DAY Patient taking differently: Take 40 mg by mouth daily.  01/01/19  Yes [name], DO  metFORMIN (GLUCOPHAGE) 1000 MG tablet TAKE 1 TABLET BY MOUTH TWICE A DAY WITH MEALS Patient taking differently: Take 1,000 mg by mouth 2 (two) times daily with a meal.  12/06/18  Yes Rumball, Bryson Ha, DO  nortriptyline (PAMELOR) 10 MG capsule Take 1 capsule (10 mg total) by mouth at bedtime. 05/10/19  Yes Hensel, Jamal Collin, MD  pindolol (VISKEN) 5 MG tablet Take 0.5 tablets (2.5 mg total) by mouth 2 (two) times daily. 03/24/19  Yes Isaiah Serge, NP  tiotropium (SPIRIVA) 18 MCG inhalation capsule Place 1 capsule (18 mcg total) into inhaler and inhale  daily. 12/14/18  Yes Benay Pike, MD  torsemide [facility]) 20 MG tablet Take 1 tablet by mouth daily, you may take 1 extra tablet daily by mouth only as needed 04/07/19  Yes Isaiah Serge, NP  triamcinolone (KENALOG) 0.025 % ointment Apply 1 application topically 2 (two) times daily. Patient taking differently: Apply 1 application topically 2 (two) times daily as needed.  05/21/19  Yes [name], DO  Blood Glucose Monitoring Suppl (ACCU-CHEK AVIVA) device Use as instructed 03/31/19 03/30/20  Zenia Resides, MD  glucose blood (ACCU-CHEK AVIVA) test strip Use to check sugar up to 3 times daily 04/14/19   Zenia Resides, MD  Insulin Pen Needle (PEN NEEDLES) 32G X 4 MM MISC 1 each by Does not apply route 3 (three) times daily. 06/06/19 05/31/20  [name], DO  Lancets (ACCU-CHEK SOFT TOUCH) lancets Use as instructed 04/05/19   [name], DO  omeprazole (PRILOSEC) 40 MG capsule Take 1 capsule (40 mg total) by mouth daily. 07/29/19   Varney Biles, MD    Family History Family History  Problem Relation Age of Onset   Hypertension Mother    COPD Mother    Hypertension Father    Cancer Sister        UTERINE???   COPD Sister    Hypertension Sister    Hypertension Sister     Social History Social History   Tobacco Use   Smoking status: Current Every Day Smoker    Packs/day: 1.00    Years: 42.00    Pack years: 42.00    Types: Cigarettes    Start date: 10/20/1976   Smokeless tobacco: Never Used  Substance Use Topics   Alcohol use: Yes    Comment: very occasional   Drug use: No    Types: "Crack" cocaine    Comment: 03/30/2014 "stopped using crack 02/21/2014"     Allergies   Patient has no known allergies.   Review of Systems Review of Systems  Constitutional: Positive for activity change.  Gastrointestinal: Positive for abdominal pain and nausea.  All other systems reviewed and are negative.    Physical Exam Updated Vital Signs BP (!) 158/96     Pulse  82    Temp 97.9 F (36.6 C) (Oral)    Resp 18    Ht 5' 2.5" (1.588 m)    Wt 118.4 kg    LMP 04/06/2017    SpO2 95%    BMI 46.98 kg/m   Physical Exam Vitals signs and nursing note reviewed.  Constitutional:      Appearance: She is well-developed.  HENT:     Head: Normocephalic and atraumatic.  Eyes:     Pupils: Pupils are equal, round, and reactive to light.  Neck:     Musculoskeletal: Neck supple.  Cardiovascular:     Rate and Rhythm: Normal rate and regular rhythm.     Heart sounds: Normal heart sounds. No murmur.  Pulmonary:     Effort: Pulmonary effort is normal. No respiratory distress.  Abdominal:     General: There is no distension.     Palpations: Abdomen is soft.     Tenderness: There is abdominal tenderness in the right upper quadrant and epigastric area. There is guarding. There is no rebound. Negative signs include Murphy's sign and McBurney's sign.  Skin:    General: Skin is warm and dry.  Neurological:     Mental Status: She is alert and oriented to person, place, and time.      ED Treatments / Results  Labs (all labs ordered are listed, but only abnormal results are displayed) Labs Reviewed  SARS CORONAVIRUS 2 (TAT 6-24 HRS)    EKG None  Radiology Ct Abdomen Pelvis W Contrast  Result Date: 07/29/2019 CLINICAL DATA:  Right upper quadrant pain.  Elevated lipase. EXAM: CT ABDOMEN AND PELVIS WITH CONTRAST TECHNIQUE: Multidetector CT imaging of the abdomen and pelvis was performed using the standard protocol following bolus administration of intravenous contrast. CONTRAST:  100 mL OMNIPAQUE IOHEXOL 300 MG/ML  SOLN COMPARISON:  CT abdomen and pelvis 06/01/2015. FINDINGS: Lower chest: Mild dependent atelectasis. No pleural or pericardial effusion. Hepatobiliary: There is diffuse fatty infiltration. The liver measures 20 cm craniocaudal. 0.6 cm hypoattenuating lesion in the right hepatic lobe on image 76 of series 5 is likely a cyst but can not be definitively  characterized. Biliary tree is unremarkable. Status post cholecystectomy. Pancreas: Unremarkable. No pancreatic ductal dilatation or surrounding inflammatory changes. Spleen: Normal in size without focal abnormality. Adrenals/Urinary Tract: Small right adrenal adenoma is unchanged. Mild thickening of the left adrenal gland compatible with hyperplasia is also unchanged. The kidneys, ureters and urinary bladder appear normal. Stomach/Bowel: Stomach is within normal limits. Appendix appears normal. No evidence of bowel wall thickening, distention, or inflammatory changes. Vascular/Lymphatic: Aortic atherosclerosis. No enlarged abdominal or pelvic lymph nodes. Reproductive: Uterus and bilateral adnexa are unremarkable. Other: Small fat containing umbilical hernia noted. Musculoskeletal: No fracture or worrisome lesion. 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 Multilevel lumbar facet arthropathy noted. IMPRESSION: Negative for pancreatitis.  No acute abnormality abdomen or pelvis. Fatty infiltration of the liver and hepatomegaly. Unchanged small right adrenal adenoma and adrenal hyperplasia on the left. Atherosclerosis. Small fat containing umbilical hernia. 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 Lower lumbar spondylosis noted Electronically Signed   By: Inge Rise M.D.   On: 07/29/2019 15:05    Procedures Procedures (including critical care time)  Medications Ordered in ED Medications  sodium chloride (PF) 0.9 % injection (has no administration in time range)  morphine 4 MG/ML injection 8 mg (8 mg Intravenous Given 07/29/19 1339)  lactated ringers bolus 1,000 mL (1,000 mLs Intravenous New  Bag/Given (Non-Interop) 07/29/19 1337)  ondansetron (ZOFRAN) injection 4 mg (4 mg Intravenous Given 07/29/19 1339)  iohexol (OMNIPAQUE) 300 MG/ML solution 100 mL (100 mLs Intravenous Contrast Given 07/29/19 1428)     Initial Impression / Assessment and  Plan / ED Course  I have reviewed the triage vital signs and the nursing notes.  Pertinent labs & imaging results that were available during my care of the patient were reviewed by me and considered in my medical decision making (see chart for details).        58 year old female comes in a chief complaint of abdominal pain.  She has been having off-and-on pain for the last several days that has gotten intense over the past 2 or 3 days.  She was seen by the GI doctors and had elevated lipase, advised to come to the ER.  On exam she is noted to have right upper quadrant tenderness and epigastric tenderness.  No peritoneal findings.  She is status post cholecystectomy.  Her lipase was in the 400 range at first.  CT scan of the abdomen was ordered because of new onset pancreatitis with unknown cause.  It is negative for any acute findings.  She does not drink or have any history of liver disease.  Her bilirubin were fine.  Patient's pain controlled here with oral narcotics.  We will discharge her with request for follow-up with PCP and GI doctors.  Further evaluation and elevated lipase will be required if it sustained, with the possible etiologies being gastritis or medication side effect.  Clear liquid diet recommended.  The patient appears reasonably screened and/or stabilized for discharge and I doubt any other medical condition or other [facility] requiring further screening, evaluation, or treatment in the ED at this time prior to discharge.   Results from the ER workup discussed with the patient face to face and all questions answered to the best of my ability. The patient is safe for discharge with strict return precautions.   Final Clinical Impressions(s) / ED Diagnoses   Final diagnoses:  Elevated lipase    ED Discharge Orders         Ordered    omeprazole (PRILOSEC) 40 MG capsule  Daily     07/29/19 1525           Varney Biles, MD 07/29/19 1533

## 2019-07-29 NOTE — Telephone Encounter (Signed)
I called the patient back. She waited for 7 hours in the waiting room of [facility] last night and left, she never got evaluated. She is hurting more today and called an ambulance and now back in [facility] ED. I think she needs to be admitted and further workup for pancreatitis. My inpatient colleagues have been notified in case assistance is needed for her case.

## 2019-07-29 NOTE — Discharge Instructions (Signed)
We signed the ER for elevated pancreatic enzymes.  Fortunately the CAT scan is not showing any concerning findings.  It is prudent that you follow-up with your GI doctor for optimal care.  As discussed, certain medications can also cause elevation of pancreatic enzymes and your PCP should be able to help you with that.  We recommend that you consume clear liquid diet for the next 2 days.

## 2019-07-29 NOTE — Telephone Encounter (Signed)
Called patient and she states they did blood work and VS but no CT. Said they had to give priority to more sever patient's  for admission. At 10:00pm said she finally left because she was hurting so bad and need to go home and lay down

## 2019-07-29 NOTE — ED Triage Notes (Signed)
Pt BIB EMS from home. Pt was here yesterday but waited in the lobby too long and left. Pt was sent here yesterday from PCP abnormal labs indicating pancreatitis. Pt reports RUQ pain.   BP 166/96 HR 76 CBG 120 98% RA Temp 97.9

## 2019-07-29 NOTE — Telephone Encounter (Signed)
Called [name] to check in and assess control of her diabetes and her progress with smoking cessation. She was in tremendous pain and relayed her frustration with her abdominal pain and long wait yesterday at the [facility].   She stated that her doctor told her she had pancreatitis yesterday and that she needed to go to the hospital for admission. Please see Dr. Ozella Rocks note for more detail on the visit. After waiting at the ED from 3:30 to 10 pm she stated she was in too much pain to wait any longer and had to go home to lie down.   She reports that nothing helps the pain except for lying down and being very still. She reports nothing worsens the pain as it is relatively constant.   She reports having minimal appetite but understands that she needs to eat some food as she is a diabetic. This morning so far she has eaten a baked potato. Per the patient, this food did not worsen or relieve her pain.   She also reported that she did have a bowel movement today for the first time in 3 days and is having no issues with urinating too little or too much.   [name] states she is trying to get a hold of her doctor when he is done with procedures around noon and will attempt to be admitted to the hospital for management of her pain/pancreatitis.     Due to the patient's current pain and more emergent issues at hand, conversation about her diabetes management and smoking cessation was not attempted.   Will call the patient in ~7-10 days to discuss her therapies and overall well-being.   Thank you,   Eddie Candle, PharmD PGY-1 Pharmacy Resident

## 2019-07-31 ENCOUNTER — Other Ambulatory Visit: Payer: Self-pay | Admitting: Sports Medicine

## 2019-07-31 DIAGNOSIS — E790 Hyperuricemia without signs of inflammatory arthritis and tophaceous disease: Secondary | ICD-10-CM

## 2019-08-04 ENCOUNTER — Other Ambulatory Visit: Payer: Self-pay | Admitting: Family Medicine

## 2019-08-08 ENCOUNTER — Telehealth: Payer: Self-pay | Admitting: Pharmacist

## 2019-08-08 NOTE — Telephone Encounter (Signed)
Attempted phone follow-up RE Diabetes   No answer, left message - plan to try again later in the week.

## 2019-08-09 ENCOUNTER — Other Ambulatory Visit: Payer: Self-pay | Admitting: Family Medicine

## 2019-08-11 ENCOUNTER — Telehealth: Payer: Self-pay | Admitting: Pharmacist

## 2019-08-11 MED ORDER — BUPROPION HCL ER (XL) 300 MG PO TB24: 300.0000 mg | ORAL_TABLET | Freq: Every day | ORAL | 1 refills | Status: DC

## 2019-08-11 NOTE — Telephone Encounter (Signed)
Called patient RE tobacco cessation and diabetes   Tobacco cessation - Patient states she is taking bupropion and finds that it is helping her. She is almost out and requests a refill. Bupropion prescription reordered. She is still smoking, reports smoking about 7-8 cigarettes per day. Her favorite cigarette is the one first thing in am with coffee. All other cigs are mostly out of habit.   She does have a supply of gum and patches at home already. She has had nausea with the gum with prior use, however she was chewing it as regular gum rather than using the chew and park method. She is open to trying the gum again using appropriate chewing technique. We talked about the importance of quitting smoking and I encouraged her to start replacing some cigarettes with her nicotine gum. Will have close follow up in 2 weeks   Diabetes- Patient's blood sugar is well controlled, reports SMBGs of 120, 119, 125, 130 (fasting in AM). She seldom hs nocturia, but only when she drinks liquids late at night. She reports no hypoglycemic episodes and overall states she is feeling much better. She did report concern with Victoza (liraglutide) and pancreatitis d/t her recent ED visit. However, her CT scan did not show pancreatitis, she only had elevated lipase. She is still taking the Victoza and reports feeling much better.  Will continue to follow, advised to contact us with new/recurrent abdominal pain by phone - 2 weeks.

## 2019-08-11 NOTE — Telephone Encounter (Signed)
Noted and agree. 

## 2019-08-25 ENCOUNTER — Telehealth: Payer: Self-pay | Admitting: Pharmacist

## 2019-08-25 NOTE — Telephone Encounter (Signed)
Attempted contact to follow-up on tobacco cessation and Diabetes control.   Patient answered and was unable to talk at present time.   Plan to contact her in 3-4 days.  Patient was accepting of this plan.

## 2019-08-29 ENCOUNTER — Ambulatory Visit (INDEPENDENT_AMBULATORY_CARE_PROVIDER_SITE_OTHER): Payer: Medicare HMO | Admitting: Sports Medicine

## 2019-08-29 ENCOUNTER — Telehealth: Payer: Self-pay | Admitting: Pharmacist

## 2019-08-29 ENCOUNTER — Other Ambulatory Visit: Payer: Self-pay

## 2019-08-29 DIAGNOSIS — M7541 Impingement syndrome of right shoulder: Secondary | ICD-10-CM | POA: Diagnosis not present

## 2019-08-29 DIAGNOSIS — M180 Bilateral primary osteoarthritis of first carpometacarpal joints: Secondary | ICD-10-CM

## 2019-08-29 DIAGNOSIS — G4733 Obstructive sleep apnea (adult) (pediatric): Secondary | ICD-10-CM | POA: Diagnosis not present

## 2019-08-29 DIAGNOSIS — M654 Radial styloid tenosynovitis [de Quervain]: Secondary | ICD-10-CM

## 2019-08-29 MED ORDER — TRAMADOL HCL 50 MG PO TABS: 50.0000 mg | ORAL_TABLET | Freq: Three times a day (TID) | ORAL | 0 refills | Status: DC | PRN

## 2019-08-29 MED ORDER — NICOTINE 21 MG/24HR TD PT24: 21.0000 mg | MEDICATED_PATCH | Freq: Every day | TRANSDERMAL | 1 refills | Status: DC

## 2019-08-29 NOTE — Assessment & Plan Note (Signed)
This is starting to flare as well, last injection was in June 2019. We will try a few weeks of physical therapy before considering a repeat right first extensor compartment injection.

## 2019-08-29 NOTE — Telephone Encounter (Signed)
Contacted patient RE diabetes control and tobacco cessation attempt.   Patient reports Diabetes control has been good.  We discussed A1C in October was ~ 6.  She has not checked blood sugars recently due to her busy schedule with a family funeral.   Denies any adverse effects of Liraglutide and empagliflozin.  Patient reports continued smoking despite use of bupropion 329m daily.  Reports reduced intake of 7-10 cigs per day.  Desires use of patch in combination.   Patient selected dose of 21 mg nicotine patch.  Agreed to send Rx for patches to her pharmacy + 1 refill.   Follow-up call in 1 week.

## 2019-08-29 NOTE — Telephone Encounter (Signed)
REviewed and agree.

## 2019-08-29 NOTE — Assessment & Plan Note (Signed)
Left first [facility] has been injected twice recently, November 2019, June 2020, continues to have significant pain. At the last visit we discussed referral to hand surgery for [facility] arthroplasty. I am going to place this referral again, she did not follow through with it before. Adding some tramadol to wear in the meantime as well as a short thumb spica Exos cast.

## 2019-08-29 NOTE — Assessment & Plan Note (Signed)
X-rays, formal PT, injection if no better.

## 2019-08-29 NOTE — Progress Notes (Signed)
Subjective:    CC: Bilateral Wist and R shoulder pain   HPI: Hailey Barnes is a pleasant 57 year old who presents today with returning symptoms from her thumb basal joint arthritis and tenosynovitis in addition to new onset impingement symptoms in her R shoulder. She received bilateral 1st CMC injections ~3 months ago from which she received good relief on the R side but minimal benefit on the L which only lasted ~ 1 month. She was referred to orthopedic hand surgery for arthroplasty of the L wrist however she did not follow-up.  Her shoulder has been painful since a fall several months ago and is worse with overhead movements.   I reviewed the past medical history, family history, social history, surgical history, and allergies today and no changes were needed.  Please see the problem list section below in epic for further details.  Past Medical History: Past Medical History:  Diagnosis Date  . Arthritis    "knees" (02/21/2014), back  . CHF (congestive heart failure) ([place])   . Chronic bronchitis ([place])    "get it q yr" (02/21/2014)  . Chronic diastolic CHF (congestive heart failure) ([place])    a. Echo 10/23/2016: EF 75 %  . Chronic lower back pain   . Complication of anesthesia    "I don't come out well; I chew on my tongue"  . COPD (chronic obstructive pulmonary disease) ([place])   . Diabetes mellitus without complication ([place])    TYPE 2  . Family history of adverse reaction to anesthesia    BROTHER PONV, had a blood clot several days later and heart stopped  . Fatty liver    NONALCOLIC  . Fibroid   . Heart murmur   . Hypertension   . Infection of right eye    current dx on Saturday 7/15 - using drops  . Leg swelling    bilateral  . Migraine 1982-2009  . MVP (mitral valve prolapse)   . Neuromuscular disorder ([place])    right leg nerve pain  . Shortness of breath    WITH EXERTION  . Sinus pause    a. noted on telemetry during admission from 10/2016, lasting up to 4.4 seconds. BB  discontinued.   . Sleep apnea 02/2016   CPAP 16 TO 20  . Stroke [facility]) noted on CAT 02/2014   "light", LLE weakness remains (03/30/2014)  . 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 Tingling in extremities 12/12/2010   Uric acid and electrolytes (02/23) normal but WBC elevated.   D/Dx: carpal tunnel, ulnar neuropathy Less likely: cervical radiculopathy, vasculitis    Past Surgical History: Past Surgical History:  Procedure Laterality Date  . [place]  . CHOLECYSTECTOMY N/A 06/17/2017   Procedure: LAPAROSCOPIC CHOLECYSTECTOMY WITH INTRAOPERATIVE CHOLANGIOGRAM;  Surgeon: Donnie Mesa, MD;  Location: [place];  Service: General;  Laterality: N/A;  . COLONOSCOPY WITH PROPOFOL N/A 04/28/2017   Procedure: COLONOSCOPY WITH PROPOFOL;  Surgeon: Manus Gunning, MD;  [facility];  Service: Gastroenterology;  Laterality: N/A;  . DILATATION & CURETTAGE/HYSTEROSCOPY WITH MYOSURE N/A 05/06/2016   Procedure: DILATATION & CURETTAGE/HYSTEROSCOPY WITH MYOSURE;  Surgeon: Terrance Mass, MD;  Location: [place] [facility];  Service: Gynecology;  Laterality: N/A;  request to follow around 8:45  requests one hour OR time  . DILATATION & CURETTAGE/HYSTEROSCOPY WITH MYOSURE N/A 04/03/2017   Procedure: DILATATION & CURETTAGE/HYSTEROSCOPY WITH MYOSURE;  Surgeon: Terrance Mass, MD;  Location:  [facility];  Service: Gynecology;  Laterality: N/A;  . DILATION AND CURETTAGE OF UTERUS    . ESOPHAGOGASTRODUODENOSCOPY (EGD) WITH PROPOFOL N/A 04/28/2017   Procedure: ESOPHAGOGASTRODUODENOSCOPY (EGD) WITH PROPOFOL;  Surgeon: Manus Gunning, MD;  [facility];  Service: Gastroenterology;  Laterality: N/A;  . FOOT SURGERY Bilateral    "took bones out; put pins in"  . KNEE ARTHROSCOPY Left 08/16/2018   Procedure: ARTHROSCOPY KNEE PARTIAL MEDIAL MENISECTOMY, CHONDROPLASTY MEDIAL AND LATERAL, PLICA RELEASE MEDIAL;  Surgeon: Dorna Leitz, MD;   Location: [place];  Service: Orthopedics;  Laterality: Left;  . LEFT AND RIGHT HEART CATHETERIZATION WITH CORONARY ANGIOGRAM N/A 03/31/2014   Procedure: LEFT AND RIGHT HEART CATHETERIZATION WITH CORONARY ANGIOGRAM;  Surgeon: Birdie Riddle, MD;  Location: [place] [facility];  Service: Cardiovascular;  Laterality: N/A;  . MYOMECTOMY  YRS AGO  . sonohystogram  04/14/2016   [facility]: intramural fibroid, premenopausal endometrium, right fluid filled tubular structure   Social History: Social History   Socioeconomic History  . Marital status: Divorced    Spouse name: Not on file  . Number of children: 1  . Years of education: 17  . Highest education level: 12th grade  Occupational History  . Occupation: Psychologist, educational    Comment: disabled  . Occupation: patient care assistance  Social Needs  . Financial resource strain: Not hard at all  . Food insecurity    Worry: Sometimes true    Inability: Sometimes true  . Transportation needs    Medical: Yes    Non-medical: Yes  Tobacco Use  . Smoking status: Current Every Day Smoker    Packs/day: 1.00    Years: 42.00    Pack years: 42.00    Types: Cigarettes    Start date: 10/20/1976  . Smokeless tobacco: Never Used  Substance and Sexual Activity  . Alcohol use: Yes    Comment: very occasional  . Drug use: No    Types: "Crack" cocaine    Comment: 03/30/2014 "stopped using crack 02/21/2014"  . Sexual activity: Yes    Birth control/protection: Post-menopausal  Lifestyle  . Physical activity    Days per week: 0 days    Minutes per session: 0 min  . Stress: Not at all  Relationships  . Social connections    Talks on phone: More than three times a week    Gets together: Never    Attends religious service: More than 4 times per year    Active member of club or organization: No    Attends meetings of clubs or organizations: Never    Relationship status: Divorced  Other Topics Concern  . Not on file  Social History  Narrative   Divorced since 2015.   Lives alone. No pets. Daughter lives in [place]. Daughter helps with transportation. Talks daily on phone, sees once or twice a week. 2 grandkids.   Enjoys church at [employer], [place] geared towards people with substance abuse history.    Lives in duplex apartment, one level, three stairs to get in. Has handrails, has grab bars in bathroom. Smoke alarms.   Bakes most meals, avoids starches, rice. Eats vegetables. Drinks tea, ginger ale.    Likes to go to movies, used to walk daily but not now due to knee pain. Likes to spend time with grandchildren, go out to eat. Spend time with family and circle of friends.    Family History: Family History  Problem Relation Age of Onset  . Hypertension Mother   .  COPD Mother   . Hypertension Father   . Cancer Sister        UTERINE???  . COPD Sister   . Hypertension Sister   . Hypertension Sister    Allergies: No Known Allergies Medications: See med rec.  Review of Systems: No fevers, chills, night sweats, weight loss, chest pain, or shortness of breath.   Objective:    General: Well Developed, well nourished, and in no acute distress.  Neuro: Alert and oriented x3, extra-ocular muscles intact, sensation grossly intact.  HEENT: Normocephalic, atraumatic, pupils equal round reactive to light. Skin: Warm and dry, no rashes. Cardiac: Regular rate and rhythm, no lower extremity edema.  Respiratory: Not using accessory muscles, speaking in full sentences.  Wrists: Swelling over Thenar eminence bilaterally. ROM smooth and normal with good flexion and extension and ulnar/radial deviation that is symmetrical with opposite wrist. Positive Finkelstein in L wrist. Tender to palpation over 1st CMC joint. Mild snuffbox tenderness.  R Shoulder: Inspection reveals no abnormalities, atrophy or asymmetry. Palpation is normal with no tenderness over AC joint or bicipital groove. ROM is full in all  planes. Positive Near's and empty can.  Speeds normal. Normal scapular function observed. Painful arc No drop arm sign.  A/P: Mekaylah is experiencing a return of painful symptoms following bilateral 1st CMC joint steroid injection. She was referred to orthopedic hand surgery for arthroplasty of the L wrist however she did not follow-up. This recommendation was restated today along with referrals to physical therapy for R shoulder and wrist. Injection was not performed today due to lack of response and concerns about her prior elevated A1c levels.  Impression and Recommendations:    Impingement syndrome, shoulder, right X-rays, formal PT, injection if no better.  De Quervain's tenosynovitis, right This is starting to flare as well, last injection was in June 2019. We will try a few weeks of physical therapy before considering a repeat right first extensor compartment injection.  Primary osteoarthritis of both first carpometacarpal joints Left first [facility] has been injected twice recently, November 2019, June 2020, continues to have significant pain. At the last visit we discussed referral to hand surgery for [facility] arthroplasty. I am going to place this referral again, she did not follow through with it before. Adding some tramadol to wear in the meantime as well as a short thumb spica Exos cast.   ___________________________________________ Gwen Her. Dianah Field, M.D., ABFM., CAQSM. Primary Care and Sports Medicine [facility]  Adjunct Professor of [place] [employer] [facility] [employer]

## 2019-09-06 ENCOUNTER — Ambulatory Visit: Payer: Medicare HMO | Admitting: Rehabilitative and Restorative Service Providers"

## 2019-09-08 ENCOUNTER — Telehealth: Payer: Self-pay | Admitting: Pharmacist

## 2019-09-08 NOTE — Telephone Encounter (Signed)
Attempted phone call RE  Tobacco cessation and Diabetes control.   Attempted 2 calls, left message requesting call back X 1.   Plan 1 additional follow-up in 7-10 days via phone.  No [facility] office visits scheduled at this time.

## 2019-09-20 ENCOUNTER — Encounter: Payer: Self-pay | Admitting: Sports Medicine

## 2019-09-20 ENCOUNTER — Other Ambulatory Visit: Payer: Self-pay

## 2019-09-20 ENCOUNTER — Ambulatory Visit (INDEPENDENT_AMBULATORY_CARE_PROVIDER_SITE_OTHER): Payer: Medicare HMO | Admitting: Sports Medicine

## 2019-09-20 DIAGNOSIS — M654 Radial styloid tenosynovitis [de Quervain]: Secondary | ICD-10-CM

## 2019-09-20 DIAGNOSIS — F32A Depression, unspecified: Secondary | ICD-10-CM

## 2019-09-20 DIAGNOSIS — F329 Major depressive disorder, single episode, unspecified: Secondary | ICD-10-CM

## 2019-09-20 DIAGNOSIS — M7541 Impingement syndrome of right shoulder: Secondary | ICD-10-CM

## 2019-09-20 DIAGNOSIS — G5603 Carpal tunnel syndrome, bilateral upper limbs: Secondary | ICD-10-CM | POA: Diagnosis not present

## 2019-09-20 NOTE — Assessment & Plan Note (Signed)
Currently on nortriptyline and Wellbutrin. To Dr. Ky Barban, I do think it might be prudent to switch to a serotonin norepinephrine reuptake inhibitor instead of her tricyclic, it will be impossible to fully control her pain until her mood disorder is controlled as well.

## 2019-09-20 NOTE — Assessment & Plan Note (Signed)
Could not afford therapy, right subacromial injection as above, rotator cuff appeared intact on ultrasound. Home rehab given, return in a month for this.

## 2019-09-20 NOTE — Progress Notes (Signed)
Subjective:    CC: Follow-up  HPI: Hailey Barnes returns, she is a 57 year old female, we been treating her for right de Quervain's tendinitis, she was unable to afford physical therapy but did not let us know.  Continued pain, first extensor compartment, last injection was many months ago.  In addition she is starting to develop numbness and tingling in her right hand, carpal tunnel syndrome has been diagnosed in the past, paresthesias are in a median nerve distribution.  Shoulder pain: Right-sided, localized over the deltoid and worse with abduction and overhead activities, again, did not do physical therapy.  Widespread aches and pains: She does have known mood disorder, currently treated with nortriptyline and Wellbutrin.  Husband is sick, she has worsening of her mood.  No suicidal or homicidal ideation.  I reviewed the past medical history, family history, social history, surgical history, and allergies today and no changes were needed.  Please see the problem list section below in epic for further details.  Past Medical History: Past Medical History:  Diagnosis Date  . Arthritis    "knees" (02/21/2014), back  . CHF (congestive heart failure) ([place])   . Chronic bronchitis ([place])    "get it q yr" (02/21/2014)  . Chronic diastolic CHF (congestive heart failure) ([place])    a. Echo 10/23/2016: EF 75 %  . Chronic lower back pain   . Complication of anesthesia    "I don't come out well; I chew on my tongue"  . COPD (chronic obstructive pulmonary disease) ([place])   . Diabetes mellitus without complication ([place])    TYPE 2  . Family history of adverse reaction to anesthesia    BROTHER PONV, had a blood clot several days later and heart stopped  . Fatty liver    NONALCOLIC  . Fibroid   . Heart murmur   . Hypertension   . Infection of right eye    current dx on Saturday 7/15 - using drops  . Leg swelling    bilateral  . Migraine 1982-2009  . MVP (mitral valve prolapse)   . Neuromuscular  disorder ([place])    right leg nerve pain  . Shortness of breath    WITH EXERTION  . Sinus pause    a. noted on telemetry during admission from 10/2016, lasting up to 4.4 seconds. BB discontinued.   . Sleep apnea 02/2016   CPAP 16 TO 20  . Stroke [facility]) noted on CAT 02/2014   "light", LLE weakness remains (03/30/2014)  . 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 Tingling in extremities 12/12/2010   Uric acid and electrolytes (02/23) normal but WBC elevated.   D/Dx: carpal tunnel, ulnar neuropathy Less likely: cervical radiculopathy, vasculitis    Past Surgical History: Past Surgical History:  Procedure Laterality Date  . [name]  . CHOLECYSTECTOMY N/A 06/17/2017   Procedure: LAPAROSCOPIC CHOLECYSTECTOMY WITH INTRAOPERATIVE CHOLANGIOGRAM;  Surgeon: Donnie Mesa, MD;  Location: [place];  Service: General;  Laterality: N/A;  . COLONOSCOPY WITH PROPOFOL N/A 04/28/2017   Procedure: COLONOSCOPY WITH PROPOFOL;  Surgeon: Manus Gunning, MD;  [facility];  Service: Gastroenterology;  Laterality: N/A;  . DILATATION & CURETTAGE/HYSTEROSCOPY WITH MYOSURE N/A 05/06/2016   Procedure: DILATATION & CURETTAGE/HYSTEROSCOPY WITH MYOSURE;  Surgeon: Terrance Mass, MD;  Location: [place];  Service: Gynecology;  Laterality: N/A;  request to follow around 8:45  requests one hour OR time  . DILATATION & CURETTAGE/HYSTEROSCOPY  WITH MYOSURE N/A 04/03/2017   Procedure: DILATATION & CURETTAGE/HYSTEROSCOPY WITH MYOSURE;  Surgeon: Terrance Mass, MD;  Location: [facility];  Service: Gynecology;  Laterality: N/A;  . DILATION AND CURETTAGE OF UTERUS    . ESOPHAGOGASTRODUODENOSCOPY (EGD) WITH PROPOFOL N/A 04/28/2017   Procedure: ESOPHAGOGASTRODUODENOSCOPY (EGD) WITH PROPOFOL;  Surgeon: Manus Gunning, MD;  [facility];  Service: Gastroenterology;  Laterality: N/A;  . FOOT SURGERY Bilateral    "took bones out; put pins  in"  . KNEE ARTHROSCOPY Left 08/16/2018   Procedure: ARTHROSCOPY KNEE PARTIAL MEDIAL MENISECTOMY, CHONDROPLASTY MEDIAL AND LATERAL, PLICA RELEASE MEDIAL;  Surgeon: Dorna Leitz, MD;  Location: [place];  Service: Orthopedics;  Laterality: Left;  . LEFT AND RIGHT HEART CATHETERIZATION WITH CORONARY ANGIOGRAM N/A 03/31/2014   Procedure: LEFT AND RIGHT HEART CATHETERIZATION WITH CORONARY ANGIOGRAM;  Surgeon: Birdie Riddle, MD;  Location: [place] [facility];  Service: Cardiovascular;  Laterality: N/A;  . MYOMECTOMY  YRS AGO  . sonohystogram  04/14/2016   [facility]: intramural fibroid, premenopausal endometrium, right fluid filled tubular structure   Social History: Social History   Socioeconomic History  . Marital status: Divorced    Spouse name: Not on file  . Number of children: 1  . Years of education: 4  . Highest education level: 12th grade  Occupational History  . Occupation: Psychologist, educational    Comment: disabled  . Occupation: patient care assistance  Social Needs  . Financial resource strain: Not hard at all  . Food insecurity    Worry: Sometimes true    Inability: Sometimes true  . Transportation needs    Medical: Yes    Non-medical: Yes  Tobacco Use  . Smoking status: Current Every Day Smoker    Packs/day: 1.00    Years: 42.00    Pack years: 42.00    Types: Cigarettes    Start date: 10/20/1976  . Smokeless tobacco: Never Used  Substance and Sexual Activity  . Alcohol use: Yes    Comment: very occasional  . Drug use: No    Types: "Crack" cocaine    Comment: 03/30/2014 "stopped using crack 02/21/2014"  . Sexual activity: Yes    Birth control/protection: Post-menopausal  Lifestyle  . Physical activity    Days per week: 0 days    Minutes per session: 0 min  . Stress: Not at all  Relationships  . Social connections    Talks on phone: More than three times a week    Gets together: Never    Attends religious service: More than 4 times per year    Active  member of club or organization: No    Attends meetings of clubs or organizations: Never    Relationship status: Divorced  Other Topics Concern  . Not on file  Social History Narrative   Divorced since 2015.   Lives alone. No pets. Daughter lives in [place]. Daughter helps with transportation. Talks daily on phone, sees once or twice a week. 2 grandkids.   Enjoys church at [employer], [place] geared towards people with substance abuse history.    Lives in duplex apartment, one level, three stairs to get in. Has handrails, has grab bars in bathroom. Smoke alarms.   Bakes most meals, avoids starches, rice. Eats vegetables. Drinks tea, ginger ale.    Likes to go to movies, used to walk daily but not now due to knee pain. Likes to spend time with grandchildren, go out to eat. Spend time with family and  circle of friends.    Family History: Family History  Problem Relation Age of Onset  . Hypertension Mother   . COPD Mother   . Hypertension Father   . Cancer Sister        UTERINE???  . COPD Sister   . Hypertension Sister   . Hypertension Sister    Allergies: No Known Allergies Medications: See med rec.  Review of Systems: No fevers, chills, night sweats, weight loss, chest pain, or shortness of breath.   Objective:    General: Well Developed, well nourished, and in no acute distress.  Neuro: Alert and oriented x3, extra-ocular muscles intact, sensation grossly intact.  HEENT: Normocephalic, atraumatic, pupils equal round reactive to light, neck supple, no masses, no lymphadenopathy, thyroid nonpalpable.  Skin: Warm and dry, no rashes. Cardiac: Regular rate and rhythm, no murmurs rubs or gallops, no lower extremity edema.  Respiratory: Clear to auscultation bilaterally. Not using accessory muscles, speaking in full sentences.  Procedure: Real-time Ultrasound Guided injection of the right first extensor compartment Device: Samsung HS60  Verbal informed consent  obtained.  Time-out conducted.  Noted no overlying erythema, induration, or other signs of local infection.  Skin prepped in a sterile fashion.  Local anesthesia: Topical Ethyl chloride.  With sterile technique and under real time ultrasound guidance:  25-gauge needle advanced between the abductor pollicis longus and the extensor pollicis brevis, 1 cc Kenalog 40, 1 cc lidocaine, 1 cc bupivacaine injected easily Completed without difficulty  Pain immediately resolved suggesting accurate placement of the medication.  Advised to call if fevers/chills, erythema, induration, drainage, or persistent bleeding.  Images permanently stored and available for review in the ultrasound unit.  Impression: Technically successful ultrasound guided injection.  Procedure: Real-time Ultrasound Guided injection of the right subacromial bursa  device: Samsung HS60  Verbal informed consent obtained.  Time-out conducted.  Noted no overlying erythema, induration, or other signs of local infection.  Skin prepped in a sterile fashion.  Local anesthesia: Topical Ethyl chloride.  With sterile technique and under real time ultrasound guidance: 1 cc Kenalog 40, 1 cc lidocaine, 1 cc bupivacaine injected easily Completed without difficulty  Pain immediately resolved suggesting accurate placement of the medication.  Advised to call if fevers/chills, erythema, induration, drainage, or persistent bleeding.  Images permanently stored and available for review in the ultrasound unit.  Impression: Technically successful ultrasound guided injection.  Impression and Recommendations:    Bilateral carpal tunnel syndrome Sounds as though her right hand is starting to go numb again, we will discuss this at a future visit.  De Quervain's tenosynovitis, right Could not afford physical therapy, first extensor compartment injection under ultrasound guidance today, home rehab given. Return in a month for this.  Depression  Currently on nortriptyline and Wellbutrin. To Dr. Ky Barban, I do think it might be prudent to switch to a serotonin norepinephrine reuptake inhibitor instead of her tricyclic, it will be impossible to fully control her pain until her mood disorder is controlled as well.  Impingement syndrome, shoulder, right Could not afford therapy, right subacromial injection as above, rotator cuff appeared intact on ultrasound. Home rehab given, return in a month for this.   ___________________________________________ Gwen Her. Dianah Field, M.D., ABFM., CAQSM. Primary Care and Sports Medicine [facility]  Adjunct Professor of [place] [employer] [facility] of Medicine

## 2019-09-20 NOTE — Assessment & Plan Note (Signed)
Could not afford physical therapy, first extensor compartment injection under ultrasound guidance today, home rehab given. Return in a month for this.

## 2019-09-20 NOTE — Assessment & Plan Note (Signed)
Sounds as though her right hand is starting to go numb again, we will discuss this at a future visit.

## 2019-09-22 ENCOUNTER — Telehealth: Payer: Self-pay | Admitting: Pharmacist

## 2019-09-22 NOTE — Telephone Encounter (Signed)
Attempted call X2 this week and left message both time.   I requested call back to my direct line at her convenience.   No additional call planned at this time.

## 2019-09-24 ENCOUNTER — Other Ambulatory Visit: Payer: Self-pay | Admitting: Gastroenterology

## 2019-10-04 ENCOUNTER — Other Ambulatory Visit: Payer: Self-pay | Admitting: *Deleted

## 2019-10-04 ENCOUNTER — Other Ambulatory Visit: Payer: Self-pay | Admitting: Gastroenterology

## 2019-10-04 MED ORDER — POTASSIUM CHLORIDE CRYS ER 20 MEQ PO TBCR: 20.0000 meq | EXTENDED_RELEASE_TABLET | Freq: Every day | ORAL | 1 refills | Status: DC

## 2019-10-10 ENCOUNTER — Other Ambulatory Visit: Payer: Self-pay | Admitting: Family Medicine

## 2019-10-10 ENCOUNTER — Other Ambulatory Visit: Payer: Self-pay | Admitting: *Deleted

## 2019-10-10 NOTE — Telephone Encounter (Signed)
Hailey Kicks, NP decreased Pindolol to 2.5 mg BID during her June 2020 appt.  Pt continue to take 5 mg BID.  Will forward to Hailey Barnes to determine if pt should continue on 5 mg BID or decrease as ordered in June.  Will refill once this has been reviewed.

## 2019-10-10 NOTE — Telephone Encounter (Signed)
Pt calling in for a refill for Pindolol.  She was given 1 year supply 03/2019.  Called pt to verify, and she advised that she has been taking a whole tablet every since she started the medication and she didn't know she was supposed to take 1/2 tablet until she spoke with me. Pt is going to run out in 6 days and can't get another refill, per her pharmacy, until 10/29/2019.  Please advise!

## 2019-10-10 NOTE — Telephone Encounter (Signed)
On last visit in 06/2019 she also said she was taking 2.5 mg BID.  She should continue whatever dose she is taking now.

## 2019-10-11 MED ORDER — PINDOLOL 5 MG PO TABS: 5.0000 mg | ORAL_TABLET | Freq: Two times a day (BID) | ORAL | 3 refills | Status: DC

## 2019-10-11 NOTE — Telephone Encounter (Signed)
Per [name], NP - pt is to continue Pindolol as she has been taking it.  Called and verified with patient who states she has always taken the whole 5 mg tablet twice a day.  Refill sent into pharmacy as requested.  Pt is aware.

## 2019-10-12 ENCOUNTER — Ambulatory Visit: Payer: Medicare HMO | Attending: Internal Medicine

## 2019-10-12 DIAGNOSIS — U071 COVID-19: Secondary | ICD-10-CM | POA: Diagnosis not present

## 2019-10-12 DIAGNOSIS — R238 Other skin changes: Secondary | ICD-10-CM | POA: Diagnosis not present

## 2019-10-12 DIAGNOSIS — G4733 Obstructive sleep apnea (adult) (pediatric): Secondary | ICD-10-CM | POA: Diagnosis not present

## 2019-10-14 LAB — NOVEL CORONAVIRUS, NAA: SARS-CoV-2, NAA: DETECTED — AB

## 2019-10-15 ENCOUNTER — Telehealth: Payer: Self-pay | Admitting: Unknown Physician Specialty

## 2019-10-15 NOTE — Telephone Encounter (Signed)
Discuss with patient about Covid symptoms and the use of bamlanivimab, a monoclonal antibody infusion for those with mild to moderate Covid symptoms and at a high risk of hospitalization.  Pt is qualified for this infusion at the [facility] infusion center due to Age >55 and Diabetes and symptom onset of 12/22  She will think about it and call me back

## 2019-10-17 ENCOUNTER — Telehealth: Payer: Self-pay | Admitting: Unknown Physician Specialty

## 2019-10-17 ENCOUNTER — Other Ambulatory Visit: Payer: Self-pay | Admitting: Unknown Physician Specialty

## 2019-10-17 DIAGNOSIS — U071 COVID-19: Secondary | ICD-10-CM

## 2019-10-17 NOTE — Telephone Encounter (Signed)
  I connected by phone with Hailey Barnes on 10/17/2019 at 9:44 AM to discuss the potential use of an new treatment for mild to moderate COVID-19 viral infection in non-hospitalized patients.  This patient is a 58 y.o. female that meets the FDA criteria for Emergency Use Authorization of bamlanivimab or casirivimab\imdevimab.  Has a (+) direct SARS-CoV-2 viral test result  Has mild or moderate COVID-19   Is ? 58 years of age and weighs ? 40 kg  Is NOT hospitalized due to COVID-19  Is NOT requiring oxygen therapy or requiring an increase in baseline oxygen flow rate due to COVID-19  Is within 10 days of symptom onset  Has at least one of the high risk factor(s) for progression to severe COVID-19 and/or hospitalization as defined in EUA.  Specific high risk criteria : Diabetes    I have spoken and communicated the following to the patient or parent/caregiver:  1. FDA has authorized the emergency use of bamlanivimab and casirivimab\imdevimab for the treatment of mild to moderate COVID-19 in adults and pediatric patients with positive results of direct SARS-CoV-2 viral testing who are 10 years of age and older weighing at least 40 kg, and who are at high risk for progressing to severe COVID-19 and/or hospitalization.  2. The significant known and potential risks and benefits of bamlanivimab and casirivimab\imdevimab, and the extent to which such potential risks and benefits are unknown.  3. Information on available alternative treatments and the risks and benefits of those alternatives, including clinical trials.  4. Patients treated with bamlanivimab and casirivimab\imdevimab should continue to self-isolate and use infection control measures (e.g., wear mask, isolate, social distance, avoid sharing personal items, clean and disinfect "high touch" surfaces, and frequent handwashing) according to CDC guidelines.   5. The patient or parent/caregiver has the option to accept or refuse  bamlanivimab or casirivimab\imdevimab .  After reviewing this information with the patient, The patient agreed to proceed with receiving the bamlanimivab infusion and will be provided a copy of the Fact sheet prior to receiving the infusion.Hailey Barnes 10/17/2019 9:44 AM

## 2019-10-18 ENCOUNTER — Ambulatory Visit (HOSPITAL_COMMUNITY)
Admission: RE | Admit: 2019-10-18 | Discharge: 2019-10-18 | Disposition: A | Discharge status: Home / Self Care | Payer: Medicare Other | Source: Ambulatory Visit | Attending: Pulmonary Disease | Admitting: Pulmonary Disease

## 2019-10-18 ENCOUNTER — Encounter (HOSPITAL_COMMUNITY): Payer: Self-pay

## 2019-10-18 ENCOUNTER — Ambulatory Visit: Payer: Medicare HMO | Admitting: Sports Medicine

## 2019-10-18 DIAGNOSIS — Z23 Encounter for immunization: Secondary | ICD-10-CM | POA: Insufficient documentation

## 2019-10-18 DIAGNOSIS — U071 COVID-19: Secondary | ICD-10-CM | POA: Diagnosis not present

## 2019-10-18 MED ORDER — SODIUM CHLORIDE 0.9 % IV SOLN
700.0000 mg | Freq: Once | INTRAVENOUS | Status: AC
  Administered 2019-10-18: 700 mg via INTRAVENOUS
  Filled 2019-10-18: qty 20

## 2019-10-18 MED ORDER — EPINEPHRINE 0.3 MG/0.3ML IJ SOAJ: 0.3000 mg | Freq: Once | INTRAMUSCULAR | Status: DC | PRN

## 2019-10-18 MED ORDER — METHYLPREDNISOLONE SODIUM SUCC 125 MG IJ SOLR: 125.0000 mg | Freq: Once | INTRAMUSCULAR | Status: DC | PRN

## 2019-10-18 MED ORDER — SODIUM CHLORIDE 0.9 % IV SOLN
INTRAVENOUS | Status: DC | PRN
  Administered 2019-10-18: 250 mL via INTRAVENOUS

## 2019-10-18 MED ORDER — DIPHENHYDRAMINE HCL 50 MG/ML IJ SOLN: 50.0000 mg | Freq: Once | INTRAMUSCULAR | Status: DC | PRN

## 2019-10-18 MED ORDER — FAMOTIDINE IN NACL 20-0.9 MG/50ML-% IV SOLN: 20.0000 mg | Freq: Once | INTRAVENOUS | Status: DC | PRN

## 2019-10-18 MED ORDER — ALBUTEROL SULFATE HFA 108 (90 BASE) MCG/ACT IN AERS: 2.0000 | INHALATION_SPRAY | Freq: Once | RESPIRATORY_TRACT | Status: DC | PRN

## 2019-10-18 NOTE — Discharge Instructions (Signed)
COVID-19: How to Protect Yourself and Others Know how it spreads  There is currently no vaccine to prevent coronavirus disease 2019 (COVID-19).  The best way to prevent illness is to avoid being exposed to this virus.  The virus is thought to spread mainly from person-to-person. ? Between people who are in close contact with one another (within about 6 feet). ? Through respiratory droplets produced when an infected person coughs, sneezes or talks. ? These droplets can land in the mouths or noses of people who are nearby or possibly be inhaled into the lungs. ? Some recent studies have suggested that COVID-19 may be spread by people who are not showing symptoms. Everyone should Clean your hands often  Wash your hands often with soap and water for at least 20 seconds especially after you have been in a public place, or after blowing your nose, coughing, or sneezing.  If soap and water are not readily available, use a hand sanitizer that contains at least 60% alcohol. Cover all surfaces of your hands and rub them together until they feel dry.  Avoid touching your eyes, nose, and mouth with unwashed hands. Avoid close contact  Stay home if you are sick.  Avoid close contact with people who are sick.  Put distance between yourself and other people. ? Remember that some people without symptoms may be able to spread virus. ? This is especially important for people who are at higher risk of getting very [employer].cy Cover your mouth and nose with a cloth face cover when around others  You could spread COVID-19 to others even if you do not feel sick.  Everyone should wear a cloth face cover when they have to go out in public, for example to the grocery store or to pick up other necessities. ? Cloth face coverings should not be placed on young children under age 21, anyone who has trouble breathing, or is unconscious,  incapacitated or otherwise unable to remove the mask without assistance.  The cloth face cover is meant to protect other people in case you are infected.  Do NOT use a facemask meant for a Dietitian.  Continue to keep about 6 feet between yourself and others. The cloth face cover is not a substitute for social distancing. Cover coughs and sneezes  If you are in a private setting and do not have on your cloth face covering, remember to always cover your mouth and nose with a tissue when you cough or sneeze or use the inside of your elbow.  Throw used tissues in the trash.  Immediately wash your hands with soap and water for at least 20 seconds. If soap and water are not readily available, clean your hands with a hand sanitizer that contains at least 60% alcohol. Clean and disinfect  Clean AND disinfect frequently touched surfaces daily. This includes tables, doorknobs, light switches, countertops, handles, desks, phones, keyboards, toilets, faucets, and sinks. [employer]fr  If surfaces are dirty, clean them: Use detergent or soap and water prior to disinfection.  Then, use a household disinfectant. You can see a list of EPA-registered household disinfectants here. michellinders.com 02/22/2019 This information is not intended to replace advice given to you by your health care provider. Make sure you discuss any questions you have with your health care provider. Document Released: 02/01/2019 Document Revised: 03/02/2019 Document Reviewed: 02/01/2019 Elsevier Patient Education  [place].

## 2019-10-18 NOTE — Progress Notes (Signed)
  Diagnosis: COVID-19  Physician: Dr. Ky Barban  Procedure: [facility] Med: bamlanivimab infusion - Provided patient with bamlanimivab fact sheet for patients, parents and caregivers prior to infusion.  Complications: No immediate complications noted.  Discharge: Discharged home   Hailey Barnes N Sierrah Luevano 10/18/2019

## 2019-10-25 ENCOUNTER — Other Ambulatory Visit: Payer: Self-pay | Admitting: Family Medicine

## 2019-10-25 DIAGNOSIS — B356 Tinea cruris: Secondary | ICD-10-CM

## 2019-11-10 ENCOUNTER — Ambulatory Visit (INDEPENDENT_AMBULATORY_CARE_PROVIDER_SITE_OTHER): Payer: Medicare HMO | Admitting: Family Medicine

## 2019-11-10 ENCOUNTER — Other Ambulatory Visit: Payer: Self-pay

## 2019-11-10 ENCOUNTER — Other Ambulatory Visit (HOSPITAL_COMMUNITY)
Admission: RE | Admit: 2019-11-10 | Discharge: 2019-11-10 | Disposition: A | Discharge status: Home / Self Care | Payer: Medicare HMO | Source: Ambulatory Visit | Attending: Family Medicine | Admitting: Family Medicine

## 2019-11-10 VITALS — BP 150/112 | HR 82 | Wt 273.2 lb

## 2019-11-10 DIAGNOSIS — N898 Other specified noninflammatory disorders of vagina: Secondary | ICD-10-CM | POA: Insufficient documentation

## 2019-11-10 DIAGNOSIS — E119 Type 2 diabetes mellitus without complications: Secondary | ICD-10-CM | POA: Diagnosis not present

## 2019-11-10 DIAGNOSIS — I10 Essential (primary) hypertension: Secondary | ICD-10-CM

## 2019-11-10 LAB — POCT WET PREP (WET MOUNT)
Clue Cells Wet Prep Whiff POC: NEGATIVE
Trichomonas Wet Prep HPF POC: ABSENT

## 2019-11-10 LAB — POCT GLYCOSYLATED HEMOGLOBIN (HGB A1C): HbA1c, POC (controlled diabetic range): 6 % (ref 0.0–7.0)

## 2019-11-10 MED ORDER — INDAPAMIDE 1.25 MG PO TABS: 1.2500 mg | ORAL_TABLET | Freq: Every day | ORAL | 3 refills | Status: DC

## 2019-11-10 MED ORDER — AMLODIPINE BESYLATE 5 MG PO TABS: 5.0000 mg | ORAL_TABLET | Freq: Every day | ORAL | 3 refills | Status: DC

## 2019-11-10 MED ORDER — PINDOLOL 10 MG PO TABS: 10.0000 mg | ORAL_TABLET | Freq: Two times a day (BID) | ORAL | 3 refills | Status: DC

## 2019-11-10 NOTE — Progress Notes (Signed)
Subjective:    Hailey Barnes - 59 y.o. female MRN [id number]  Date of birth: 04-25-1961  CC:  Hailey Barnes is here for vaginal discharge.  HPI: Vaginal discharge: Started one week ago [employer] discharge and itching, some pain No dysuria Thinks it is a yeast infection because the symptoms are similar to previous yeast infections, and she may have had higher sugars lately due to eating more cake and sweets, although she has been taking her liraglutide and empagliflozin as directed Female partner one month ago, would like testing for STIs including HIV and RPR  Hypertension: Took her lisinopril 40 mg earlier today Denies headache and blurry vision Takes torsemide, lisinopril   Health Maintenance:  Health Maintenance Due  Topic Date Due  . TETANUS/TDAP  03/04/1982  . INFLUENZA VACCINE  05/21/2019    -  reports that she has been smoking cigarettes. She started smoking about 43 years ago. She has a 42.00 pack-year smoking history. She has never used smokeless tobacco. - Review of Systems: Per HPI. - Past Medical History: Patient Active Problem List   Diagnosis Date Noted  . Impingement syndrome, shoulder, right 08/29/2019  . Hyperuricemia 05/10/2019  . Foot pain, right 05/06/2019  . Nodule of skin of right thumb 04/15/2019  . Leg cramps 04/01/2019  . Tinea cruris 03/31/2019  . Lack of appetite 03/15/2019  . Wart on thumb 09/23/2018  . Acute medial meniscus tear of left knee 08/16/2018  . Plica of knee, left 92/42/6834  . Chondromalacia, left knee 08/16/2018  . Primary osteoarthritis of both knees 03/24/2018  . Primary osteoarthritis of both first carpometacarpal joints 11/16/2017  . De Quervain's tenosynovitis, right 11/02/2017  . Bilateral carpal tunnel syndrome 10/21/2017  . Vaginal itching 09/22/2017  . Dysuria 09/22/2017  . High risk sexual behavior 08/27/2017  . Benign neoplasm of sigmoid colon   . Insomnia 02/17/2017  . OSA (obstructive sleep apnea)  10/30/2016  . Sinus pause 10/23/2016  . Panic attack 10/22/2016  . Lower extremity edema   . Left lumbar radiculopathy 06/02/2016  . Hyperlipidemia 02/07/2016  . T2DM (type 2 diabetes mellitus) ([place]) 12/07/2015  . Occipital lymphadenopathy 09/21/2015  . Depression 04/19/2015  . COPD exacerbation ([place]) 10/06/2014  . Eczema 08/01/2014  . Diminished vision 06/23/2014  . Chronic diastolic CHF (congestive heart failure) ([place]) 06/13/2014  . Restrictive lung disease 05/10/2014  . Vaginal discharge 03/22/2014  . NASH (nonalcoholic steatohepatitis) 01/04/2014  . Hypertension 05/26/2010  . Morbid obesity ([place]) 12/17/2006  . Tobacco use disorder 12/17/2006   - Medications: reviewed and updated   Objective:   Physical Exam BP (!) 150/112   Pulse 82   Wt 273 lb 3.2 oz (123.9 kg)   LMP 04/06/2017   SpO2 95%   BMI 49.17 kg/m  Gen: NAD, alert, cooperative with exam, well-appearing, morbidly obese GU: Normal-appearing vulva without erythema or tenderness to palpation, normal vagina and cervix with white discharge noted, no cervical motion tenderness Psych: good insight, alert and oriented, appropriate mood and affect        Assessment & Plan:   Vaginal discharge Wet prep was negative, GC/chlamydia, HIV, RPR were collected today.  Patient counseled to try Replens vaginal moisturizer to alleviate her vaginal irritation.  It does seem that this may be a chronic problem for her per chart review, however vulvar exam did not show signs of lichen planus or another cause for her vulvar irritation..  T2DM (type 2 diabetes mellitus) (HCC) Hemoglobin A1c is 6.0 today.  Patient was congratulated on her continued good control but diet modification was discussed.  It is possible that the A1c of 14 recently was an outlier since all other hemoglobin A1c measurements have been 6-7.  We will not make any medication changes today.  Hypertension Patient hypertensive today on both measurements.  Since she  cannot tolerate amlodipine due to lower extremity swelling, is on the maximum dose of lisinopril, and also takes torsemide, the best option will be to increase her dose of pindolol from 5 to 10 mg twice daily.  Patient was amenable to this change.  We will recheck her blood pressure in about 1 week at nurse clinic.    Maia Breslow, M.D. 11/11/2019, 6:18 AM PGY-3, [place]

## 2019-11-10 NOTE — Patient Instructions (Addendum)
It was nice meeting you today Ms. Everman!  Needed for a yeast for other abnormalities.  We will let you know what your STI testing results show when they return.  Your vaginal irritation could be due to vaginal dryness.  Try the vaginal moisturizer Replens or one similar, which you can find at the drugstore or [place].  Your hemoglobin A1c was 6.0 today, which is excellent.    If you have any questions or concerns, please feel free to call the clinic.   Be well,  Dr. Shan Levans

## 2019-11-11 LAB — RPR: RPR Ser Ql: NONREACTIVE

## 2019-11-11 LAB — CERVICOVAGINAL ANCILLARY ONLY
Chlamydia: NEGATIVE
Comment: NEGATIVE
Comment: NORMAL
Neisseria Gonorrhea: NEGATIVE

## 2019-11-11 LAB — HIV ANTIBODY (ROUTINE TESTING W REFLEX): HIV Screen 4th Generation wRfx: NONREACTIVE

## 2019-11-11 NOTE — Assessment & Plan Note (Addendum)
Wet prep was negative, GC/chlamydia, HIV, RPR were collected today.  Patient counseled to try Replens vaginal moisturizer to alleviate her vaginal irritation.  It does seem that this may be a chronic problem for her per chart review, however vulvar exam did not show signs of lichen planus or another cause for her vulvar irritation.Marland Kitchen

## 2019-11-11 NOTE — Assessment & Plan Note (Signed)
Patient hypertensive today on both measurements.  Since she cannot tolerate amlodipine due to lower extremity swelling, is on the maximum dose of lisinopril, and also takes torsemide, the best option will be to increase her dose of pindolol from 5 to 10 mg twice daily.  Patient was amenable to this change.  We will recheck her blood pressure in about 1 week at nurse clinic.

## 2019-11-11 NOTE — Assessment & Plan Note (Signed)
Hemoglobin A1c is 6.0 today.  Patient was congratulated on her continued good control but diet modification was discussed.  It is possible that the A1c of 14 recently was an outlier since all other hemoglobin A1c measurements have been 6-7.  We will not make any medication changes today.

## 2019-11-15 ENCOUNTER — Telehealth: Payer: Self-pay

## 2019-11-15 NOTE — Telephone Encounter (Signed)
-----   Message from Kathrene Alu, MD sent at 11/15/2019  8:53 AM EST ----- Would you let Ms. Sweitzer know that her HIV, RPR, GC/Chlamydia results are negative?  Thank you!

## 2019-11-15 NOTE — Telephone Encounter (Signed)
Pt informed. Hailey Barnes, CMA  

## 2019-11-23 ENCOUNTER — Telehealth: Payer: Self-pay | Admitting: Family Medicine

## 2019-11-23 DIAGNOSIS — M5416 Radiculopathy, lumbar region: Secondary | ICD-10-CM

## 2019-11-23 NOTE — Telephone Encounter (Signed)
Orders placed for left L4-L5 transforaminal epidural, please contact [facility] for scheduling.

## 2019-11-23 NOTE — Telephone Encounter (Signed)
Hailey Barnes called. She wants another referral for an injection in her back.  Thanks.

## 2019-11-24 NOTE — Telephone Encounter (Signed)
Roberta notified.

## 2019-11-29 ENCOUNTER — Telehealth: Payer: Self-pay

## 2019-12-01 ENCOUNTER — Other Ambulatory Visit: Payer: Self-pay | Admitting: Family Medicine

## 2019-12-01 MED ORDER — TORSEMIDE 20 MG PO TABS: ORAL_TABLET | ORAL | 3 refills | Status: DC

## 2019-12-01 NOTE — Telephone Encounter (Signed)
Patient calling concerning her refill on torsemide. Patient wants to know why this has been denied. She has been without this medication for 3+ days now. Her pharmacy gave her a few pills last week to last until it gets filled, but she took those days ago. Her ankles and legs are taking on fluid and she's starting to not feel well because of it. Please call patient and let her know whats going on with this medication.

## 2019-12-01 NOTE — Telephone Encounter (Signed)
LM for patient ok per DPR that medication has been sent to pharmacy. Louis Gaw,CMA

## 2019-12-01 NOTE — Telephone Encounter (Signed)
Routing to both [place].

## 2019-12-01 NOTE — Telephone Encounter (Signed)
Please inform patient I have refilled RX. It appears she gets this from her Cardiologist, but since she is out I have refilled it  Caroline More, [place], PGY-3 [facility] 12/01/2019 2:38 PM

## 2019-12-02 ENCOUNTER — Ambulatory Visit
Admission: RE | Admit: 2019-12-02 | Discharge: 2019-12-02 | Disposition: A | Discharge status: Home / Self Care | Payer: Medicare HMO | Source: Ambulatory Visit | Attending: Sports Medicine | Admitting: Sports Medicine

## 2019-12-02 ENCOUNTER — Other Ambulatory Visit: Payer: Self-pay | Admitting: Sports Medicine

## 2019-12-02 ENCOUNTER — Other Ambulatory Visit: Payer: Self-pay

## 2019-12-02 DIAGNOSIS — G8929 Other chronic pain: Secondary | ICD-10-CM

## 2019-12-02 DIAGNOSIS — M5416 Radiculopathy, lumbar region: Secondary | ICD-10-CM

## 2019-12-02 DIAGNOSIS — M545 Low back pain: Secondary | ICD-10-CM | POA: Diagnosis not present

## 2019-12-07 DIAGNOSIS — G5602 Carpal tunnel syndrome, left upper limb: Secondary | ICD-10-CM | POA: Diagnosis not present

## 2019-12-07 DIAGNOSIS — M79645 Pain in left finger(s): Secondary | ICD-10-CM | POA: Diagnosis not present

## 2019-12-07 DIAGNOSIS — M1812 Unilateral primary osteoarthritis of first carpometacarpal joint, left hand: Secondary | ICD-10-CM | POA: Diagnosis not present

## 2019-12-13 ENCOUNTER — Encounter: Payer: Self-pay | Admitting: Family Medicine

## 2019-12-13 ENCOUNTER — Telehealth: Payer: Self-pay | Admitting: Pharmacist

## 2019-12-13 NOTE — Telephone Encounter (Signed)
Followed up with patient via [place].

## 2019-12-13 NOTE — Telephone Encounter (Addendum)
Patient also reported feeling like she had a yeast infection.    She reports stress and steroid injection recently which have likely increased blood glucose.  She denies checking blood sugars recently.  She reports nocturia and thinks her blood sugars are probably high.   I agreed to share her complaint with PCP - [facility] for follow-up evaluation and potentially treatment by phone BUT I shared that prescribing something would be for the physician to prescribe.  She agreed with that plan.

## 2019-12-13 NOTE — Telephone Encounter (Signed)
Contacted patient RE tobacco cessation follow-up.   Patient reports continued intake of 3-4 cigs per day and remains interested in quitting.  However, life stress including back pain and multiple deaths in the family make a quit attempt in the near future less likely.   She reports less smoking during the nigh and continues to use nortriptyline which she believes has helped.   We agreed patient can ask for help at any point.  I am happy to help.

## 2019-12-15 ENCOUNTER — Other Ambulatory Visit: Payer: Self-pay | Admitting: Family Medicine

## 2019-12-15 MED ORDER — FLUCONAZOLE 150 MG PO TABS: 150.0000 mg | ORAL_TABLET | Freq: Once | ORAL | 0 refills | Status: AC

## 2020-01-01 ENCOUNTER — Other Ambulatory Visit: Payer: Self-pay | Admitting: Family Medicine

## 2020-01-10 DIAGNOSIS — M25562 Pain in left knee: Secondary | ICD-10-CM | POA: Diagnosis not present

## 2020-01-10 DIAGNOSIS — M1711 Unilateral primary osteoarthritis, right knee: Secondary | ICD-10-CM | POA: Diagnosis not present

## 2020-01-10 DIAGNOSIS — Z6841 Body Mass Index (BMI) 40.0 and over, adult: Secondary | ICD-10-CM | POA: Diagnosis not present

## 2020-01-10 DIAGNOSIS — M25561 Pain in right knee: Secondary | ICD-10-CM | POA: Diagnosis not present

## 2020-01-10 DIAGNOSIS — M1712 Unilateral primary osteoarthritis, left knee: Secondary | ICD-10-CM | POA: Diagnosis not present

## 2020-01-10 DIAGNOSIS — M170 Bilateral primary osteoarthritis of knee: Secondary | ICD-10-CM | POA: Diagnosis not present

## 2020-01-11 ENCOUNTER — Other Ambulatory Visit: Payer: Self-pay

## 2020-01-11 MED ORDER — METFORMIN HCL 1000 MG PO TABS: 1000.0000 mg | ORAL_TABLET | Freq: Two times a day (BID) | ORAL | 1 refills | Status: DC

## 2020-01-28 ENCOUNTER — Other Ambulatory Visit: Payer: Self-pay | Admitting: Family Medicine

## 2020-01-28 DIAGNOSIS — E119 Type 2 diabetes mellitus without complications: Secondary | ICD-10-CM

## 2020-02-02 ENCOUNTER — Ambulatory Visit: Payer: Medicare HMO | Admitting: Cardiology

## 2020-02-02 ENCOUNTER — Encounter: Payer: Self-pay | Admitting: Cardiology

## 2020-02-02 ENCOUNTER — Other Ambulatory Visit: Payer: Self-pay

## 2020-02-02 ENCOUNTER — Other Ambulatory Visit: Payer: Self-pay | Admitting: Orthopedic Surgery

## 2020-02-02 VITALS — BP 144/96 | HR 85 | Ht 63.0 in | Wt 283.0 lb

## 2020-02-02 DIAGNOSIS — M25569 Pain in unspecified knee: Secondary | ICD-10-CM

## 2020-02-02 DIAGNOSIS — Z8673 Personal history of transient ischemic attack (TIA), and cerebral infarction without residual deficits: Secondary | ICD-10-CM | POA: Diagnosis not present

## 2020-02-02 DIAGNOSIS — I5032 Chronic diastolic (congestive) heart failure: Secondary | ICD-10-CM | POA: Diagnosis not present

## 2020-02-02 NOTE — Progress Notes (Signed)
Cardiology Office Note:    Date:  02/02/2020   ID:  Skii, [place] 03-24-1961, MRN [id number]  PCP:  [name], DO  Cardiologist:  Candee Furbish, MD  Electrophysiologist:  None   Referring MD: [name], DO     History of Present Illness:    Hailey Barnes is a 59 y.o. female here for follow-up for chronic diastolic heart failure, COPD, diabetes prior stroke tobacco use morbid obesity.  Had prior thyroid removal.  At one point had sinus bradycardia at night with pause of 4.4 seconds.  Her beta-blocker was discontinued at that time.  Torsemide has helped with edema however she has noted some mild increase in lower extremity edema and wanted to get checked out.  When she takes the extra torsemide she does sometimes get cramping in her hands.  She has not been taking an extra potassium with that.  Unfortunately lost her ex-husband to lung cancer, in fact she states that he died earlier this morning.       Past Medical History:  Diagnosis Date  . Arthritis    "knees" (02/21/2014), back  . CHF (congestive heart failure) ([place])   . Chronic bronchitis ([place])    "get it q yr" (02/21/2014)  . Chronic diastolic CHF (congestive heart failure) ([place])    a. Echo 10/23/2016: EF 75 %  . Chronic lower back pain   . Complication of anesthesia    "I don't come out well; I chew on my tongue"  . COPD (chronic obstructive pulmonary disease) ([place])   . Diabetes mellitus without complication ([place])    TYPE 2  . Family history of adverse reaction to anesthesia    BROTHER PONV, had a blood clot several days later and heart stopped  . Fatty liver    NONALCOLIC  . Fibroid   . Heart murmur   . Hypertension   . Infection of right eye    current dx on Saturday 7/15 - using drops  . Leg swelling    bilateral  . Migraine 1982-2009  . MVP (mitral valve prolapse)   . Neuromuscular disorder ([place])    right leg nerve pain  . Shortness of breath    WITH EXERTION  . Sinus pause    a.  noted on telemetry during admission from 10/2016, lasting up to 4.4 seconds. BB discontinued.   . Sleep apnea 02/2016   CPAP 16 TO 20  . Stroke [facility]) noted on CAT 02/2014   "light", LLE weakness remains (03/30/2014)  . 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 Tingling in extremities 12/12/2010   Uric acid and electrolytes (02/23) normal but WBC elevated.   D/Dx: carpal tunnel, ulnar neuropathy Less likely: cervical radiculopathy, vasculitis     Past Surgical History:  Procedure Laterality Date  . [place]  . CHOLECYSTECTOMY N/A 06/17/2017   Procedure: LAPAROSCOPIC CHOLECYSTECTOMY WITH INTRAOPERATIVE CHOLANGIOGRAM;  Surgeon: Donnie Mesa, MD;  Location: [place];  Service: General;  Laterality: N/A;  . COLONOSCOPY WITH PROPOFOL N/A 04/28/2017   Procedure: COLONOSCOPY WITH PROPOFOL;  Surgeon: Manus Gunning, MD;  Location: [facility];  Service: Gastroenterology;  Laterality: N/A;  . DILATATION & CURETTAGE/HYSTEROSCOPY WITH MYOSURE N/A 05/06/2016   Procedure: DILATATION & CURETTAGE/HYSTEROSCOPY WITH MYOSURE;  Surgeon: Terrance Mass, MD;  Location: [place] [facility];  Service: Gynecology;  Laterality: N/A;  request to follow around 8:45  requests one hour OR time  .  DILATATION & CURETTAGE/HYSTEROSCOPY WITH MYOSURE N/A 04/03/2017   Procedure: DILATATION & CURETTAGE/HYSTEROSCOPY WITH MYOSURE;  Surgeon: Terrance Mass, MD;  Location: [place] [facility];  Service: Gynecology;  Laterality: N/A;  . DILATION AND CURETTAGE OF UTERUS    . ESOPHAGOGASTRODUODENOSCOPY (EGD) WITH PROPOFOL N/A 04/28/2017   Procedure: ESOPHAGOGASTRODUODENOSCOPY (EGD) WITH PROPOFOL;  Surgeon: Manus Gunning, MD;  Location: [facility];  Service: Gastroenterology;  Laterality: N/A;  . FOOT SURGERY Bilateral    "took bones out; put pins in"  . KNEE ARTHROSCOPY Left 08/16/2018   Procedure: ARTHROSCOPY KNEE PARTIAL MEDIAL MENISECTOMY, CHONDROPLASTY MEDIAL AND  LATERAL, PLICA RELEASE MEDIAL;  Surgeon: Dorna Leitz, MD;  Location: [place];  Service: Orthopedics;  Laterality: Left;  . LEFT AND RIGHT HEART CATHETERIZATION WITH CORONARY ANGIOGRAM N/A 03/31/2014   Procedure: LEFT AND RIGHT HEART CATHETERIZATION WITH CORONARY ANGIOGRAM;  Surgeon: Birdie Riddle, MD;  Location: [place] [facility];  Service: Cardiovascular;  Laterality: N/A;  . MYOMECTOMY  YRS AGO  . sonohystogram  04/14/2016   [facility]: intramural fibroid, premenopausal endometrium, right fluid filled tubular structure    Current Medications: Current Meds  Medication Sig  . allopurinol (ZYLOPRIM) 300 MG tablet TAKE 1 TABLET BY MOUTH EVERY DAY  . atorvastatin (LIPITOR) 40 MG tablet Take 40 mg by mouth once a day  . Blood Glucose Monitoring Suppl (ACCU-CHEK AVIVA) device Use as instructed  . BREO ELLIPTA 200-25 MCG/INH AEPB TAKE 1 PUFF BY MOUTH EVERY DAY  . empagliflozin (JARDIANCE) 25 MG TABS tablet Take 25 mg by mouth daily.  Marland Kitchen glucose blood (ACCU-CHEK AVIVA) test strip Use to check sugar up to 3 times daily  . Insulin Pen Needle (PEN NEEDLES) 32G X 4 MM MISC 1 each by Does not apply route 3 (three) times daily.  . Lancets (ACCU-CHEK SOFT TOUCH) lancets Use as instructed  . lisinopril (PRINIVIL,ZESTRIL) 40 MG tablet TAKE 1 TABLET BY MOUTH EVERY DAY (Patient taking differently: Take 40 mg by mouth daily. )  . metFORMIN (GLUCOPHAGE) 1000 MG tablet Take 1 tablet (1,000 mg total) by mouth 2 (two) times daily with a meal.  . nortriptyline (PAMELOR) 10 MG capsule Take 1 capsule (10 mg total) by mouth at bedtime. (Patient taking differently: Take 10 mg by mouth at bedtime as needed for sleep. )  . omeprazole (PRILOSEC) 40 MG capsule TAKE 1 CAPSULE BY MOUTH EVERY DAY  . pindolol (VISKEN) 10 MG tablet Take 1 tablet (10 mg total) by mouth 2 (two) times daily.  . potassium chloride SA (KLOR-CON M20) 20 MEQ tablet Take 1 tablet (20 mEq total) by mouth daily.  Marland Kitchen tiotropium (SPIRIVA) 18  MCG inhalation capsule Place 1 capsule (18 mcg total) into inhaler and inhale daily.  Marland Kitchen torsemide (DEMADEX) 20 MG tablet Take 1 tablet by mouth daily, you may take 1 extra tablet daily by mouth only as needed  . traMADol (ULTRAM) 50 MG tablet Take 1 tablet (50 mg total) by mouth every 8 (eight) hours as needed for moderate pain. Maximum 6 tabs per day.  . triamcinolone (KENALOG) 0.025 % ointment APPLY 1 APPLICATION TOPICALLY 2 (TWO) TIMES DAILY AS NEEDED.  Marland Kitchen VICTOZA 18 MG/3ML SOPN INJECT 1.2 MG UNDER THE SKIN ONCE DAILY     Allergies:   Bupropion   Social History   Socioeconomic History  . Marital status: Divorced    Spouse name: Not on file  . Number of children: 1  . Years of education: 50  . Highest education level: 12th grade  Occupational History  . Occupation: Psychologist, educational    Comment: disabled  . Occupation: patient care assistance  Tobacco Use  . Smoking status: Current Every Day Smoker    Packs/day: 1.00    Years: 42.00    Pack years: 42.00    Types: Cigarettes    Start date: 10/20/1976  . Smokeless tobacco: Never Used  Substance and Sexual Activity  . Alcohol use: Yes    Comment: very occasional  . Drug use: No    Types: "Crack" cocaine    Comment: 03/30/2014 "stopped using crack 02/21/2014"  . Sexual activity: Yes    Birth control/protection: Post-menopausal  Other Topics Concern  . Not on file  Social History Narrative   Divorced since 2015.   Lives alone. No pets. Daughter lives in [place]. Daughter helps with transportation. Talks daily on phone, sees once or twice a week. 2 grandkids.   Enjoys church at [employer], [place] geared towards people with substance abuse history.    Lives in duplex apartment, one level, three stairs to get in. Has handrails, has grab bars in bathroom. Smoke alarms.   Bakes most meals, avoids starches, rice. Eats vegetables. Drinks tea, ginger ale.    Likes to go to movies, used to walk daily but not now due to knee pain.  Likes to spend time with grandchildren, go out to eat. Spend time with family and circle of friends.    Social Determinants of Health   Financial Resource Strain:   . Difficulty of Paying Living Expenses:   Food Insecurity:   . Worried About Charity fundraiser in the Last Year:   . Arboriculturist in the Last Year:   Transportation Needs:   . Film/video editor (Medical):   Marland Kitchen Lack of Transportation (Non-Medical):   Physical Activity:   . Days of Exercise per Week:   . Minutes of Exercise per Session:   Stress:   . Feeling of Stress :   Social Connections:   . Frequency of Communication with Friends and Family:   . Frequency of Social Gatherings with Friends and Family:   . Attends Religious Services:   . Active Member of Clubs or Organizations:   . Attends Archivist Meetings:   Marland Kitchen Marital Status:      Family History: The patient's family history includes COPD in her mother and sister; Cancer in her sister; Hypertension in her father, mother, sister, and sister.  ROS:   Please see the history of present illness.     All other systems reviewed and are negative.  EKGs/Labs/Other Studies Reviewed:    The following studies were reviewed today:  ECHO 2018 - Left ventricle: The cavity size is small. Wall thickness was  increased in a pattern of severe LVH. Systolic function was  vigorous. The estimated ejection fraction was 75%. Wall motion  was normal; there were no regional wall motion abnormalities.  Doppler parameters are consistent with abnormal left ventricular  relaxation (grade 1 diastolic dysfunction). The E/e&' ratio is  >15, suggesting elevated LV filling pressure.  - Left atrium: The atrium was normal in size.  - Inferior vena cava: The vessel was normal in size. The  respirophasic diameter changes were in the normal range (>= 50%),  consistent with normal central venous pressure.   Impressions:   - Compard to a prior study in  2015, the LVEF is higher at 75%.  There is now severe LV wall thickening, diastolic dysfunction and  elevated LV filling pressure.   EKG:  EKG is not ordered today.    Recent Labs: 03/31/2019: Magnesium 1.9 04/11/2019: TSH 3.260 07/28/2019: ALT 15; BUN 15; Creatinine, Ser 1.05; Hemoglobin 16.4; Platelets 265; Potassium 3.5; Sodium 136  Recent Lipid Panel    Component Value Date/Time   CHOL 152 09/20/2018 1116   TRIG 179 (H) 09/20/2018 1116   HDL 43 09/20/2018 1116   CHOLHDL 3.5 09/20/2018 1116   CHOLHDL 4.1 07/02/2017 0652   VLDL 23 07/02/2017 0652   LDLCALC 73 09/20/2018 1116    Physical Exam:    VS:  BP (!) 144/96   Pulse 85   Ht 5' 3"  (1.6 m)   Wt 283 lb (128.4 kg)   LMP 04/06/2017   SpO2 98%   BMI 50.13 kg/m     Wt Readings from Last 3 Encounters:  02/02/20 283 lb (128.4 kg)  11/10/19 273 lb 3.2 oz (123.9 kg)  09/20/19 270 lb (122.5 kg)     GEN:  Well nourished, well developed in no acute distress HEENT: Normal NECK: No JVD; No carotid bruits LYMPHATICS: No lymphadenopathy CARDIAC: RRR, no murmurs, rubs, gallops RESPIRATORY:  Clear to auscultation without rales, wheezing or rhonchi  ABDOMEN: Soft, non-tender, non-distended MUSCULOSKELETAL:  2+ bilateral edema; No deformity  SKIN: Warm and dry NEUROLOGIC:  Alert and oriented x 3 PSYCHIATRIC:  Normal affect   ASSESSMENT:    1. Chronic diastolic CHF (congestive heart failure) ([place])   2. History of CVA (cerebrovascular accident)    PLAN:    In order of problems listed above:  Lower extremity edema -We will have her take the torsemide 20 mg twice daily for the next 4 days.  She will also take an extra potassium supplementation with for a total of 40 mEq to help prevent hands from cramping.  Chronic diastolic heart failure -Torsemide as above.  Continue with good blood pressure control.  Slightly elevated today.  Hopefully additional torsemide will help.  Prior history of stroke -Currently on statin  blood pressure control aspirin diabetes control.  Diabetes with hypertension -Dr. Ky Barban has been monitoring.  No changes made.  We will have her come back in 1 month to revisit with APP to see how she is doing.  Medication Adjustments/Labs and Tests Ordered: Current medicines are reviewed at length with the patient today.  Concerns regarding medicines are outlined above.  No orders of the defined types were placed in this encounter.  No orders of the defined types were placed in this encounter.   Patient Instructions  Medication Instructions:  Please take extra Torsemide 20 mg for 4 days along with an extra potassium chloride 20 MEQ.  Then return to your normal dose. Continue all other medications as listed.  *If you need a refill on your cardiac medications before your next appointment, please call your pharmacy*  Follow-Up: At [facility], you and your health needs are our priority.  As part of our continuing mission to provide you with exceptional heart care, we have created designated Provider Care Teams.  These Care Teams include your primary Cardiologist (physician) and Advanced Practice Providers (APPs -  Physician Assistants and Nurse Practitioners) who all work together to provide you with the care you need, when you need it.  We recommend signing up for the patient portal called "MyChart".  Sign up information is provided on this After Visit Summary.  MyChart is used to connect with patients for Virtual Visits (Telemedicine).  Patients are able to view  lab/test results, encounter notes, upcoming appointments, etc.  Non-urgent messages can be sent to your provider as well.   To learn more about what you can do with MyChart, go to [email or web address].    Your next appointment:   4 week(s)  The format for your next appointment:   In Person  Provider:   You may see Candee Furbish, MD or one of the following Advanced Practice Providers on your designated Care Team:     Truitt Merle, NP  [name], NP  Kathyrn Drown, NP    Thank you for choosing [facility]!!        Signed, Candee Furbish, MD  02/02/2020 12:16 PM    [place]

## 2020-02-02 NOTE — Patient Instructions (Addendum)
Medication Instructions:  Please take extra Torsemide 20 mg for 4 days along with an extra potassium chloride 20 MEQ.  Then return to your normal dose. Continue all other medications as listed.  *If you need a refill on your cardiac medications before your next appointment, please call your pharmacy*  Follow-Up: At [facility], you and your health needs are our priority.  As part of our continuing mission to provide you with exceptional heart care, we have created designated Provider Care Teams.  These Care Teams include your primary Cardiologist (physician) and Advanced Practice Providers (APPs -  Physician Assistants and Nurse Practitioners) who all work together to provide you with the care you need, when you need it.  We recommend signing up for the patient portal called "MyChart".  Sign up information is provided on this After Visit Summary.  MyChart is used to connect with patients for Virtual Visits (Telemedicine).  Patients are able to view lab/test results, encounter notes, upcoming appointments, etc.  Non-urgent messages can be sent to your provider as well.   To learn more about what you can do with MyChart, go to [email or web address].    Your next appointment:   4 week(s)  The format for your next appointment:   In Person  Provider:   You may see Candee Furbish, MD or one of the following Advanced Practice Providers on your designated Care Team:    Truitt Merle, NP  [name], NP  Kathyrn Drown, NP    Thank you for choosing [facility]!!

## 2020-02-03 ENCOUNTER — Ambulatory Visit
Admission: RE | Admit: 2020-02-03 | Discharge: 2020-02-03 | Disposition: A | Discharge status: Home / Self Care | Payer: Medicare HMO | Source: Ambulatory Visit | Attending: Orthopedic Surgery | Admitting: Orthopedic Surgery

## 2020-02-03 ENCOUNTER — Other Ambulatory Visit (HOSPITAL_COMMUNITY): Payer: Self-pay | Admitting: Interventional Radiology

## 2020-02-03 ENCOUNTER — Encounter: Payer: Self-pay | Admitting: *Deleted

## 2020-02-03 DIAGNOSIS — M25561 Pain in right knee: Secondary | ICD-10-CM | POA: Diagnosis not present

## 2020-02-03 DIAGNOSIS — M25569 Pain in unspecified knee: Secondary | ICD-10-CM

## 2020-02-03 HISTORY — PX: IR RADIOLOGIST EVAL & MGMT: IMG5224

## 2020-02-03 NOTE — Consult Note (Signed)
Chief Complaint: Knee pain  Referring Physician(s): Graves,Rosmarie Esquibel  History of Present Illness: Hailey Barnes is a 59 y.o. female with past medical history significant for mitral valve prolapse, congestive heart failure, COPD, diabetes, hypertension, stroke, sleep apnea and history of substance abuse who has been referred to the interventional radiology clinic for evaluation of knee pain.  Patient has a long history of bilateral knee pain though states her right knee has become quite severe recently.  She denies recent injury or trauma.  Patient states that she has been on tramadol in the past with does help to blunt her knee pain though she remains in constant discomfort.  She states her pain fluctuates between 8 out of 10 at rest to 10 out of 10 with activity.  Patient ambulates without an assistive device though reports an antalgic gait.  Of note, patient's husband passed away yesterday morning from battle with melanoma and she is currently coordinating funeral arrangements though is interested in pursuing any available treatment options.    Past Medical History:  Diagnosis Date  . Arthritis    "knees" (02/21/2014), back  . CHF (congestive heart failure) ([place])   . Chronic bronchitis ([place])    "get it q yr" (02/21/2014)  . Chronic diastolic CHF (congestive heart failure) ([place])    a. Echo 10/23/2016: EF 75 %  . Chronic lower back pain   . Complication of anesthesia    "I don't come out well; I chew on my tongue"  . COPD (chronic obstructive pulmonary disease) ([place])   . Diabetes mellitus without complication ([place])    TYPE 2  . Family history of adverse reaction to anesthesia    BROTHER PONV, had a blood clot several days later and heart stopped  . Fatty liver    NONALCOLIC  . Fibroid   . Heart murmur   . Hypertension   . Infection of right eye    current dx on Saturday 7/15 - using drops  . Leg swelling    bilateral  . Migraine 1982-2009  . MVP (mitral valve prolapse)    . Neuromuscular disorder ([place])    right leg nerve pain  . Shortness of breath    WITH EXERTION  . Sinus pause    a. noted on telemetry during admission from 10/2016, lasting up to 4.4 seconds. BB discontinued.   . Sleep apnea 02/2016   CPAP 16 TO 20  . Stroke [facility]) noted on CAT 02/2014   "light", LLE weakness remains (03/30/2014)  . Substance abuse ([place])    s/p Rehab. 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 Tingling in extremities 12/12/2010   Uric acid and electrolytes (02/23) normal but WBC elevated.   D/Dx: carpal tunnel, ulnar neuropathy Less likely: cervical radiculopathy, vasculitis     Past Surgical History:  Procedure Laterality Date  . [place]  . CHOLECYSTECTOMY N/A 06/17/2017   Procedure: LAPAROSCOPIC CHOLECYSTECTOMY WITH INTRAOPERATIVE CHOLANGIOGRAM;  Surgeon: Donnie Mesa, MD;  Location: [facility];  Service: General;  Laterality: N/A;  . COLONOSCOPY WITH PROPOFOL N/A 04/28/2017   Procedure: COLONOSCOPY WITH PROPOFOL;  Surgeon: Manus Gunning, MD;  Location: [facility];  Service: Gastroenterology;  Laterality: N/A;  . DILATATION & CURETTAGE/HYSTEROSCOPY WITH MYOSURE N/A 05/06/2016   Procedure: DILATATION & CURETTAGE/HYSTEROSCOPY WITH MYOSURE;  Surgeon: Terrance Mass, MD;  Location: [facility];  Service: Gynecology;  Laterality: N/A;  request to follow around 8:45  requests one hour OR time  .  DILATATION & CURETTAGE/HYSTEROSCOPY WITH MYOSURE N/A 04/03/2017   Procedure: DILATATION & CURETTAGE/HYSTEROSCOPY WITH MYOSURE;  Surgeon: Terrance Mass, MD;  Location: [facility];  Service: Gynecology;  Laterality: N/A;  . DILATION AND CURETTAGE OF UTERUS    . ESOPHAGOGASTRODUODENOSCOPY (EGD) WITH PROPOFOL N/A 04/28/2017   Procedure: ESOPHAGOGASTRODUODENOSCOPY (EGD) WITH PROPOFOL;  Surgeon: Manus Gunning, MD;  [facility];  Service: Gastroenterology;  Laterality: N/A;  . FOOT SURGERY Bilateral    "took bones out; put pins in"    . KNEE ARTHROSCOPY Left 08/16/2018   Procedure: ARTHROSCOPY KNEE PARTIAL MEDIAL MENISECTOMY, CHONDROPLASTY MEDIAL AND LATERAL, PLICA RELEASE MEDIAL;  Surgeon: Dorna Leitz, MD;  Location: [facility];  Service: Orthopedics;  Laterality: Left;  . LEFT AND RIGHT HEART CATHETERIZATION WITH CORONARY ANGIOGRAM N/A 03/31/2014   Procedure: LEFT AND RIGHT HEART CATHETERIZATION WITH CORONARY ANGIOGRAM;  Surgeon: Birdie Riddle, MD;  Location: [facility];  Service: Cardiovascular;  Laterality: N/A;  . MYOMECTOMY  YRS AGO  . sonohystogram  04/14/2016   [facility]: intramural fibroid, premenopausal endometrium, right fluid filled tubular structure    Allergies: Bupropion  Medications: Prior to Admission medications   Medication Sig Start Date End Date Taking? Authorizing Provider  allopurinol (ZYLOPRIM) 300 MG tablet TAKE 1 TABLET BY MOUTH EVERY DAY 08/01/19   Silverio Decamp, MD  atorvastatin (LIPITOR) 40 MG tablet Take 40 mg by mouth once a day 04/21/19   [name], DO  Blood Glucose Monitoring Suppl (ACCU-CHEK AVIVA) device Use as instructed 03/31/19 03/30/20  Zenia Resides, MD  BREO ELLIPTA 200-25 MCG/INH AEPB TAKE 1 PUFF BY MOUTH EVERY DAY 01/02/20   [name], DO  empagliflozin (JARDIANCE) 25 MG TABS tablet Take 25 mg by mouth daily. 04/11/19   [name], DO  empagliflozin (JARDIANCE) 25 MG TABS tablet Take 25 mg by mouth daily.    [provider]  glucose blood (ACCU-CHEK AVIVA) test strip Use to check sugar up to 3 times daily 04/14/19   Zenia Resides, MD  Insulin Pen Needle (PEN NEEDLES) 32G X 4 MM MISC 1 each by Does not apply route 3 (three) times daily. 06/06/19 05/31/20  [name], DO  Lancets (ACCU-CHEK SOFT TOUCH) lancets Use as instructed 04/05/19   [name], DO  lisinopril (PRINIVIL,ZESTRIL) 40 MG tablet TAKE 1 TABLET BY MOUTH EVERY DAY Patient taking differently: Take 40 mg by mouth daily.  01/01/19   [name], DO   metFORMIN (GLUCOPHAGE) 1000 MG tablet Take 1 tablet (1,000 mg total) by mouth 2 (two) times daily with a meal. 01/11/20   [name], DO  nortriptyline (PAMELOR) 10 MG capsule Take 1 capsule (10 mg total) by mouth at bedtime. Patient taking differently: Take 10 mg by mouth at bedtime as needed for sleep.  05/10/19   Zenia Resides, MD  omeprazole (PRILOSEC) 40 MG capsule TAKE 1 CAPSULE BY MOUTH EVERY DAY 10/04/19   Armbruster, Carlota Raspberry, MD  pindolol (VISKEN) 10 MG tablet Take 1 tablet (10 mg total) by mouth 2 (two) times daily. 11/10/19   Kathrene Alu, MD  potassium chloride SA (KLOR-CON M20) 20 MEQ tablet Take 1 tablet (20 mEq total) by mouth daily. 10/04/19   [name], DO  tiotropium (SPIRIVA) 18 MCG inhalation capsule Place 1 capsule (18 mcg total) into inhaler and inhale daily. 12/14/18   Benay Pike, MD  torsemide (DEMADEX) 20 MG tablet Take 1 tablet by mouth daily, you may take 1 extra tablet daily by  mouth only as needed 12/01/19   Caroline More, DO  traMADol (ULTRAM) 50 MG tablet Take 1 tablet (50 mg total) by mouth every 8 (eight) hours as needed for moderate pain. Maximum 6 tabs per day. 08/29/19   Silverio Decamp, MD  triamcinolone (KENALOG) 0.025 % ointment APPLY 1 APPLICATION TOPICALLY 2 (TWO) TIMES DAILY AS NEEDED. 10/11/19   Guadalupe Dawn, MD  VICTOZA 18 MG/3ML SOPN INJECT 1.2 MG UNDER THE SKIN ONCE DAILY 01/30/20   [name], DO     Family History  Problem Relation Age of Onset  . Hypertension Mother   . COPD Mother   . Hypertension Father   . Cancer Sister        UTERINE???  . COPD Sister   . Hypertension Sister   . Hypertension Sister     Social History   Socioeconomic History  . Marital status: Divorced    Spouse name: Not on file  . Number of children: 1  . Years of education: 57  . Highest education level: 12th grade  Occupational History  . Occupation: Psychologist, educational    Comment: disabled  . Occupation: patient care  assistance  Tobacco Use  . Smoking status: Current Every Day Smoker    Packs/day: 1.00    Years: 42.00    Pack years: 42.00    Types: Cigarettes    Start date: 10/20/1976  . Smokeless tobacco: Never Used  Substance and Sexual Activity  . Alcohol use: Yes    Comment: very occasional  . Drug use: No    Types: "Crack" cocaine    Comment: 03/30/2014 "stopped using crack 02/21/2014"  . Sexual activity: Yes    Birth control/protection: Post-menopausal  Other Topics Concern  . Not on file  Social History Narrative   Divorced since 2015.   Lives alone. No pets. Daughter lives in [place]. Daughter helps with transportation. Talks daily on phone, sees once or twice a week. 2 grandkids.   Enjoys church at [employer], [place] geared towards people with substance abuse history.    Lives in duplex apartment, one level, three stairs to get in. Has handrails, has grab bars in bathroom. Smoke alarms.   Bakes most meals, avoids starches, rice. Eats vegetables. Drinks tea, ginger ale.    Likes to go to movies, used to walk daily but not now due to knee pain. Likes to spend time with grandchildren, go out to eat. Spend time with family and circle of friends.    Social Determinants of Health   Financial Resource Strain:   . Difficulty of Paying Living Expenses:   Food Insecurity:   . Worried About Charity fundraiser in the Last Year:   . Arboriculturist in the Last Year:   Transportation Needs:   . Film/video editor (Medical):   Marland Kitchen Lack of Transportation (Non-Medical):   Physical Activity:   . Days of Exercise per Week:   . Minutes of Exercise per Session:   Stress:   . Feeling of Stress :   Social Connections:   . Frequency of Communication with Friends and Family:   . Frequency of Social Gatherings with Friends and Family:   . Attends Religious Services:   . Active Member of Clubs or Organizations:   . Attends Archivist Meetings:   Marland Kitchen Marital Status:     ECOG  Status: 2 - Symptomatic, <50% confined to bed  Review of Systems  Review of Systems: A 12 point  ROS discussed and pertinent positives are indicated in the HPI above.  All other systems are negative.  Physical Exam No direct physical exam was performed (except for noted visual exam findings with Video Visits).   Vital Signs: LMP 04/06/2017   Imaging: No results found.  Labs:  CBC: Recent Labs    05/06/19 1612 05/10/19 1352 07/28/19 1027 07/28/19 1516  WBC 14.1* 13.3* 15.1* 13.5*  HGB 17.3* 16.8* 16.7* 16.4*  HCT 53.1* 50.4* 48.8* 50.4*  PLT 256 236 287 265    COAGS: No results for input(s): INR, APTT in the last 8760 hours.  BMP: Recent Labs    05/06/19 1612 05/06/19 1612 05/10/19 1352 07/26/19 1349 07/28/19 1009 07/28/19 1516  NA 143   < > 142 139 136 136  K 3.9   < > 3.8 3.9 3.5 3.5  CL 97   < > 96 100 99 101  CO2 26   < > 24 24 28 24   GLUCOSE 89   < > 80 102* 92 94  BUN 17   < > 16 15 13 15   CALCIUM 10.2   < > 9.8 10.0 9.9 9.5  CREATININE 1.22*   < > 1.22* 1.06* 1.06 1.05*  GFRNONAA 50*  --  50* 59*  --  59*  GFRAA 57*  --  57* 68  --  >60   < > = values in this interval not displayed.    LIVER FUNCTION TESTS: Recent Labs    03/31/19 1130 05/10/19 1352 07/28/19 1009 07/28/19 1516  BILITOT 0.3 0.3 0.4 0.4  AST 24 20 12  14*  ALT 34* 17 13 15   ALKPHOS 111 90 109 104  PROT 7.2 6.9 7.7 7.7  ALBUMIN 4.7 4.4 4.2 4.1    TUMOR MARKERS: No results for input(s): AFPTM, CEA, CA199, CHROMGRNA in the last 8760 hours.  Assessment and Plan:  Hailey Barnes is a 59 y.o. female with past medical history significant for mitral valve prolapse, congestive heart failure, COPD, diabetes, hypertension, stroke, sleep apnea and history of substance abuse who has been referred to the interventional radiology clinic for evaluation of knee pain.  The patient has been evaluated by Dr. Berenice Primas and been deemed a poor operative candidate given her multiple medical  comorbidities.  I explained that the [place] treatment is a new modality that utilizes cryoablation technology to temporarily alter the pain receptor nerves which supply the anterior aspect of the knee, in hopes of achieving a clinically significant reduction in knee pain.    I explained that if successful, the patient will experience a rapid pain reduction which can last for approximately 3 months.  I also explained that while the pain will (hopefully) be reduced, the structural/functional damage of the knee remains, and thus, this is not a curative technology and their knee pain will return.    Risks associated with the procedure include bleeding/bruising, infection and post procedural paresthesias/weakness.  I explained that the procedure is performed as an outpatient basis at [facility].  The procedure requires local anesthesia and does NOT require conscious sedation, and therefore the patient does need to be NPO and there is no postprocedural recovery.  The patient was encouraged to wear either shorts or loose fitting pants that can be rolled up to the upper thigh.  The patient also does NOT need to hold anticoagulation for the procedure.  Following this prolonged and detailed conversation, the patient wishes to pursue IOVERA therapy.  As such, pending insurance approval,  this procedure would be scheduled at [facility] at the next earliest convenience.   Thank you for this interesting consult.  I greatly enjoyed meeting Hailey Barnes and look forward to participating in their care.  A copy of this report was sent to the requesting provider on this date.  Electronically Signed: Sandi Mariscal 02/03/2020, 9:57 AM   I spent a total of 15 minutes in remote  clinical consultation, greater than 50% of which was counseling/coordinating care for knee pain.    Visit type: Audio only (telephone). Audio (no video) only due to patient's lack of internet/smartphone  capability. Alternative for in-person consultation at [facility], [address] [place], [place], [place]. This visit type was conducted due to national recommendations for restrictions regarding the COVID-19 Pandemic (e.g. social distancing).  This format is felt to be most appropriate for this patient at this time.  All issues noted in this document were discussed and addressed.

## 2020-02-08 ENCOUNTER — Telehealth: Payer: Self-pay | Admitting: *Deleted

## 2020-02-08 ENCOUNTER — Ambulatory Visit (HOSPITAL_COMMUNITY)
Admission: RE | Admit: 2020-02-08 | Discharge: 2020-02-08 | Disposition: A | Discharge status: Home / Self Care | Payer: Medicare HMO | Source: Ambulatory Visit | Attending: Interventional Radiology | Admitting: Interventional Radiology

## 2020-02-08 ENCOUNTER — Other Ambulatory Visit: Payer: Self-pay

## 2020-02-08 DIAGNOSIS — M25561 Pain in right knee: Secondary | ICD-10-CM | POA: Diagnosis not present

## 2020-02-08 DIAGNOSIS — M5416 Radiculopathy, lumbar region: Secondary | ICD-10-CM

## 2020-02-08 DIAGNOSIS — M25569 Pain in unspecified knee: Secondary | ICD-10-CM

## 2020-02-08 HISTORY — PX: IR ABLATE LIVER CRYOABLATION: IMG5524

## 2020-02-08 MED ORDER — LIDOCAINE-EPINEPHRINE (PF) 2 %-1:200000 IJ SOLN
INTRAMUSCULAR | Status: AC
  Filled 2020-02-08: qty 20

## 2020-02-08 MED ORDER — LIDOCAINE-EPINEPHRINE (PF) 1 %-1:200000 IJ SOLN
INTRAMUSCULAR | Status: DC | PRN
  Administered 2020-02-08: 10 mL

## 2020-02-08 NOTE — Telephone Encounter (Signed)
Pt advised and Roberta at [facility] notified.

## 2020-02-08 NOTE — Telephone Encounter (Signed)
Epidural ordered, she can tell them which side she would like.

## 2020-02-08 NOTE — Telephone Encounter (Signed)
Pt wants an order for another epidural placed.

## 2020-02-09 ENCOUNTER — Telehealth: Payer: Self-pay | Admitting: Interventional Radiology

## 2020-02-09 NOTE — Telephone Encounter (Signed)
Patient underwent technically successful cryoneurolysis of the superficial nerves supplying the right knee.  Patient rated preprocedural pain as 10 out of 10 with activity with postprocedural pain of a 0 out of 10.  Patient called stating she had recurrence of lateral sided right knee pain which she attributes to lack of dedicated cryoneurolysis of the lateral femoral cutaneous nerve which was not identified sonographically and thus not treated during yesterday's treatment session.  The patient states that she feel this is due to the anesthetic wearing off however explained that the area was not anesthetized as it was not ablated.    Regardless, I recommended conservative management at this time and will call her next week to reassess.  If her lateral sided knee pain does not improve, she could return for repeat evaluation to identify and treat the lateral femoral cutaneous nerve.  Patient demonstrated excellent understanding of the above conversation and is agreement with the plan of care.  Ronny Bacon, MD Pager #: [phone]

## 2020-02-14 ENCOUNTER — Other Ambulatory Visit: Payer: Self-pay

## 2020-02-14 ENCOUNTER — Ambulatory Visit
Admission: RE | Admit: 2020-02-14 | Discharge: 2020-02-14 | Disposition: A | Discharge status: Home / Self Care | Payer: Medicare HMO | Source: Ambulatory Visit | Attending: Sports Medicine | Admitting: Sports Medicine

## 2020-02-14 DIAGNOSIS — M5416 Radiculopathy, lumbar region: Secondary | ICD-10-CM

## 2020-02-14 DIAGNOSIS — G8929 Other chronic pain: Secondary | ICD-10-CM | POA: Diagnosis not present

## 2020-02-14 DIAGNOSIS — M545 Low back pain: Secondary | ICD-10-CM | POA: Diagnosis not present

## 2020-02-14 MED ORDER — METHYLPREDNISOLONE ACETATE 40 MG/ML INJ SUSP (RADIOLOG
120.0000 mg | Freq: Once | INTRAMUSCULAR | Status: AC
  Administered 2020-02-14: 120 mg via EPIDURAL

## 2020-02-14 MED ORDER — IOPAMIDOL (ISOVUE-M 200) INJECTION 41%
1.0000 mL | Freq: Once | INTRAMUSCULAR | Status: AC
  Administered 2020-02-14: 1 mL via EPIDURAL

## 2020-02-14 NOTE — Discharge Instructions (Signed)

## 2020-02-15 ENCOUNTER — Telehealth: Payer: Self-pay | Admitting: Interventional Radiology

## 2020-02-15 NOTE — Telephone Encounter (Signed)
Pt called again this evening following right sided IOVERA treatment on 02/07/2009  Patient states pain not significantly changed since undergoing the ablation though reports numbness to the anterior thigh and knee.    Patient not currently interested in undergoing repeat ablation of the right knee.  Patient knows to call the IR clinic with any future questions or concerns to me otherwise follow-up on a as needed basis.  Ronny Bacon, MD Pager #: [phone]

## 2020-02-17 ENCOUNTER — Other Ambulatory Visit: Payer: Self-pay

## 2020-02-17 ENCOUNTER — Ambulatory Visit (INDEPENDENT_AMBULATORY_CARE_PROVIDER_SITE_OTHER): Payer: Medicare HMO | Admitting: Family Medicine

## 2020-02-17 ENCOUNTER — Encounter: Payer: Self-pay | Admitting: Family Medicine

## 2020-02-17 VITALS — BP 156/110 | HR 64 | Ht 63.0 in | Wt 283.1 lb

## 2020-02-17 DIAGNOSIS — J449 Chronic obstructive pulmonary disease, unspecified: Secondary | ICD-10-CM

## 2020-02-17 DIAGNOSIS — E119 Type 2 diabetes mellitus without complications: Secondary | ICD-10-CM | POA: Diagnosis not present

## 2020-02-17 DIAGNOSIS — F17200 Nicotine dependence, unspecified, uncomplicated: Secondary | ICD-10-CM | POA: Diagnosis not present

## 2020-02-17 DIAGNOSIS — I10 Essential (primary) hypertension: Secondary | ICD-10-CM

## 2020-02-17 DIAGNOSIS — J441 Chronic obstructive pulmonary disease with (acute) exacerbation: Secondary | ICD-10-CM | POA: Diagnosis not present

## 2020-02-17 LAB — POCT GLYCOSYLATED HEMOGLOBIN (HGB A1C): HbA1c, POC (controlled diabetic range): 6.3 % (ref 0.0–7.0)

## 2020-02-17 LAB — POCT UA - MICROALBUMIN
Albumin/Creatinine Ratio, Urine, POC: <30
Creatinine, POC: 200 mg/dL
Microalbumin Ur, POC: 30 mg/L

## 2020-02-17 MED ORDER — VICTOZA 18 MG/3ML ~~LOC~~ SOPN: PEN_INJECTOR | SUBCUTANEOUS | 2 refills | Status: DC

## 2020-02-17 NOTE — Assessment & Plan Note (Signed)
Contributing to diabetes, HTN. Counseling provided on weight loss via diet and exercise changes.

## 2020-02-17 NOTE — Assessment & Plan Note (Signed)
Will do ambulatory blood pressure monitoring prior to making medication changes, CCM referral sent to pharmacy. Obtained BMP today.

## 2020-02-17 NOTE — Assessment & Plan Note (Signed)
Cessation counseling provided. Will obtain low dose CT for lung cancer screening.

## 2020-02-17 NOTE — Patient Instructions (Signed)
It was great to see you!  Our plans for today:  - Take the victoza at the same dose but once weekly. - You will hear from the pharmacy team about blood pressure monitoring. - We are getting a CT scan to screen for lung cancer. - Follow up after your blood pressure monitoring.  We are checking some labs today, we will call you or send you a letter if they are abnormal.   Take care and seek immediate care sooner if you develop any concerns.   Dr. Johnsie [facility]

## 2020-02-17 NOTE — Assessment & Plan Note (Signed)
Well within goal, A1c 6.3 today. Advised to take victoza once weekly instead of daily, otherwise no medication changes made today. Counseled on weight loss through diet and exercise. BMP obtained today.

## 2020-02-17 NOTE — Assessment & Plan Note (Signed)
Doing well, no changes made today.

## 2020-02-17 NOTE — Progress Notes (Signed)
    SUBJECTIVE:   CHIEF COMPLAINT: diabetes f/u  HPI:   Diabetes, Type 2 - Last A1c 6.0 11/10/19 - Medications: Jardiance 25 mg, Metformin 1000 mg twice daily, Victoza 1.2 mg weekly - Compliance: taking  - Checking BG at home: yes, ~110 - Diet: 2-3 meals per day. Eating fruits and vegetables. Had recent increase in sweet snacks but is now working on cutting back since she gained weight. - Exercise: active with grandchildren - Eye exam: UTD - Foot exam: UTD - Microalbumin: due - Statin: Yes - Denies symptoms of hypoglycemia, foot ulcers/trauma  Hypertension: - Medications: Lisinopril 40 mg daily, torsemide 20 mg daily - Compliance: good. Occasionally takes additional torsemide if has LE swelling. - Checking BP at home: no, has wrist cuff and is not working correctly.  - Denies any vision changes, medication SEs, or symptoms of hypotension  COPD - taking spiriva and breo. No issues.  Tobacco use - knows she needs to cut down. Has not had lung cancer screening.   PERTINENT  PMH / PSH: HTN, diastolic CHF, OSA, COPD, [place], T2DM, HLD, obesity, tobacco use, depression  OBJECTIVE:   BP (!) 156/110   Pulse 64   Ht 5' 3"  (1.6 m)   Wt 283 lb 2 oz (128.4 kg)   LMP 04/06/2017   SpO2 97%   BMI 50.15 kg/m   Gen: morbidly obese, in NAD Cardiac: RRR, no murmur, no LE swelling Resp: CTAB, no wheezes/rales. Normal WOB on RA  ASSESSMENT/PLAN:   Hypertension Will do ambulatory blood pressure monitoring prior to making medication changes, CCM referral sent to pharmacy. Obtained BMP today.  COPD exacerbation ([place]) Doing well, no changes made today.  T2DM (type 2 diabetes mellitus) ([place]) Well within goal, A1c 6.3 today. Advised to take victoza once weekly instead of daily, otherwise no medication changes made today. Counseled on weight loss through diet and exercise. BMP obtained today.   COPD (chronic obstructive pulmonary disease) ([place]) Doing well, no changes made today.  Morbid  obesity ([place]) Contributing to diabetes, HTN. Counseling provided on weight loss via diet and exercise changes.  Tobacco use disorder Cessation counseling provided. Will obtain low dose CT for lung cancer screening.     [name], [place]

## 2020-02-18 LAB — BASIC METABOLIC PANEL
BUN/Creatinine Ratio: 12 (ref 9–23)
BUN: 12 mg/dL (ref 6–24)
CO2: 22 mmol/L (ref 20–29)
Calcium: 9.9 mg/dL (ref 8.7–10.2)
Chloride: 102 mmol/L (ref 96–106)
Creatinine, Ser: 0.99 mg/dL (ref 0.57–1.00)
GFR calc Af Amer: 74 mL/min/{1.73_m2} (ref >59)
GFR calc non Af Amer: 64 mL/min/{1.73_m2} (ref >59)
Glucose: 87 mg/dL (ref 65–99)
Potassium: 4.2 mmol/L (ref 3.5–5.2)
Sodium: 140 mmol/L (ref 134–144)

## 2020-02-20 ENCOUNTER — Other Ambulatory Visit: Payer: Self-pay | Admitting: Family Medicine

## 2020-02-23 ENCOUNTER — Encounter: Payer: Self-pay | Admitting: Family Medicine

## 2020-02-23 DIAGNOSIS — Z1231 Encounter for screening mammogram for malignant neoplasm of breast: Secondary | ICD-10-CM | POA: Diagnosis not present

## 2020-03-04 NOTE — Progress Notes (Signed)
Cardiology Office Note   Date:  03/05/2020   ID:  Hailey Barnes, Levels 09/08/1961, MRN [id number]  PCP:  Hailey Percy, DO  Cardiologist:  Dr. Marlou Barnes     Chief Complaint  Patient presents with  . Congestive Heart Failure    diastolic  . Hypertension    poorly controlled.       History of Present Illness: Hailey Barnes is a 59 y.o. female who presents for diastolic HF and HTN.    Hx of chronic diastolic heart failure, COPD, diabetes prior stroke tobacco use morbid obesity.  Had prior thyroid removal.  At one point had sinus bradycardia at night with pause of 4.4 seconds.  Her beta-blocker was discontinued at that time.  Torsemide has helped with edema however she has noted some mild increase in lower extremity edema and wanted to get checked out.  When she takes the extra torsemide she does sometimes get cramping in her hands.  She has not been taking an extra potassium with that.  Unfortunately lost her ex-husband to lung cancer, in fact she states that he died earlier this morning. last echo in 2018 with pattern of severe LVH, EF 75%, no RWMA, G1DD elevated LV filling pressure   For edema her torsemide 20 was increased to BID for 4 days along with K+  + tobocco, COPD HTN elevated on PCP visit.   Today has not had her lisinopril for today -will pick up later so BP is elevated. At her PCP she was 156/110 also.  occ chest pain sharp shooting pain, more atypical.  No associated symptoms and EKG is unchanged.  Discussed diet, exercise, and tobacco - she smokes in her home so first only smoke outside - currently at half a ppd.  She has tried amlodipine but with increased edema so stopped.    Past Medical History:  Diagnosis Date  . Arthritis    "knees" (02/21/2014), back  . CHF (congestive heart failure) ([place])   . Chronic bronchitis ([place])    "get it q yr" (02/21/2014)  . Chronic diastolic CHF (congestive heart failure) ([place])    a. Echo 10/23/2016: EF 75 %  . Chronic  lower back pain   . Complication of anesthesia    "I don't come out well; I chew on my tongue"  . COPD (chronic obstructive pulmonary disease) ([place])   . Diabetes mellitus without complication ([place])    TYPE 2  . Family history of adverse reaction to anesthesia    BROTHER PONV, had a blood clot several days later and heart stopped  . Fatty liver    NONALCOLIC  . Fibroid   . Heart murmur   . Hypertension   . Infection of right eye    current dx on Saturday 7/15 - using drops  . Leg swelling    bilateral  . Migraine 1982-2009  . MVP (mitral valve prolapse)   . Neuromuscular disorder ([place])    right leg nerve pain  . Shortness of breath    WITH EXERTION  . Sinus pause    a. noted on telemetry during admission from 10/2016, lasting up to 4.4 seconds. BB discontinued.   . Sleep apnea 02/2016   CPAP 16 TO 20  . Stroke [facility]) noted on CAT 02/2014   "light", LLE weakness remains (03/30/2014)  . 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 Tingling in extremities 12/12/2010   Uric acid  and electrolytes (02/23) normal but WBC elevated.   D/Dx: carpal tunnel, ulnar neuropathy Less likely: cervical radiculopathy, vasculitis     Past Surgical History:  Procedure Laterality Date  . [place]  . CHOLECYSTECTOMY N/A 06/17/2017   Procedure: LAPAROSCOPIC CHOLECYSTECTOMY WITH INTRAOPERATIVE CHOLANGIOGRAM;  Surgeon: Hailey Mesa, MD;  Location: [facility];  Service: General;  Laterality: N/A;  . COLONOSCOPY WITH PROPOFOL N/A 04/28/2017   Procedure: COLONOSCOPY WITH PROPOFOL;  Surgeon: Hailey Gunning, MD;  Location: [facility];  Service: Gastroenterology;  Laterality: N/A;  . DILATATION & CURETTAGE/HYSTEROSCOPY WITH MYOSURE N/A 05/06/2016   Procedure: DILATATION & CURETTAGE/HYSTEROSCOPY WITH MYOSURE;  Surgeon: Hailey Mass, MD;  Location: [facility];  Service: Gynecology;  Laterality: N/A;  request to follow around 8:45  requests  one hour OR time  . DILATATION & CURETTAGE/HYSTEROSCOPY WITH MYOSURE N/A 04/03/2017   Procedure: DILATATION & CURETTAGE/HYSTEROSCOPY WITH MYOSURE;  Surgeon: Hailey Mass, MD;  Location: [facility];  Service: Gynecology;  Laterality: N/A;  . DILATION AND CURETTAGE OF UTERUS    . ESOPHAGOGASTRODUODENOSCOPY (EGD) WITH PROPOFOL N/A 04/28/2017   Procedure: ESOPHAGOGASTRODUODENOSCOPY (EGD) WITH PROPOFOL;  Surgeon: Hailey Gunning, MD;  Location: [facility];  Service: Gastroenterology;  Laterality: N/A;  . FOOT SURGERY Bilateral    "took bones out; put pins in"  . IR ABLATE LIVER CRYOABLATION  02/08/2020  . IR RADIOLOGIST EVAL & MGMT  02/03/2020  . KNEE ARTHROSCOPY Left 08/16/2018   Procedure: ARTHROSCOPY KNEE PARTIAL MEDIAL MENISECTOMY, CHONDROPLASTY MEDIAL AND LATERAL, PLICA RELEASE MEDIAL;  Surgeon: Hailey Leitz, MD;  Location: [facility];  Service: Orthopedics;  Laterality: Left;  . LEFT AND RIGHT HEART CATHETERIZATION WITH CORONARY ANGIOGRAM N/A 03/31/2014   Procedure: LEFT AND RIGHT HEART CATHETERIZATION WITH CORONARY ANGIOGRAM;  Surgeon: Hailey Riddle, MD;  Location: [facility];  Service: Cardiovascular;  Laterality: N/A;  . MYOMECTOMY  YRS AGO  . sonohystogram  04/14/2016   [facility]: intramural fibroid, premenopausal endometrium, right fluid filled tubular structure     Current Outpatient Medications  Medication Sig Dispense Refill  . allopurinol (ZYLOPRIM) 300 MG tablet TAKE 1 TABLET BY MOUTH EVERY DAY 90 tablet 1  . atorvastatin (LIPITOR) 40 MG tablet Take 40 mg by mouth once a day 90 tablet 3  . Blood Glucose Monitoring Suppl (ACCU-CHEK AVIVA) device Use as instructed 1 each 0  . BREO ELLIPTA 200-25 MCG/INH AEPB TAKE 1 PUFF BY MOUTH EVERY DAY 60 each 5  . empagliflozin (JARDIANCE) 25 MG TABS tablet Take 25 mg by mouth daily. 90 tablet 3  . glucose blood (ACCU-CHEK AVIVA) test strip Use to check sugar up to 3 times daily 100 each 12  . Insulin Pen Needle (PEN  NEEDLES) 32G X 4 MM MISC 1 each by Does not apply route 3 (three) times daily. 200 each 4  . Lancets (ACCU-CHEK SOFT TOUCH) lancets Use as instructed 100 each 12  . liraglutide (VICTOZA) 18 MG/3ML SOPN Inject 5.4TG minus 2 clicks under the skin once weekly. 1 pen 2  . lisinopril (ZESTRIL) 40 MG tablet Take 1 tablet (40 mg total) by mouth daily. 90 tablet 3  . metFORMIN (GLUCOPHAGE) 1000 MG tablet Take 1 tablet (1,000 mg total) by mouth 2 (two) times daily with a meal. 180 tablet 1  . pindolol (VISKEN) 10 MG tablet Take 1 tablet (10 mg total) by mouth 2 (two) times daily. 180 tablet 3  . potassium chloride SA (KLOR-CON M20) 20 MEQ tablet Take 1 tablet (20  mEq total) by mouth daily. 90 tablet 1  . tiotropium (SPIRIVA) 18 MCG inhalation capsule Place 1 capsule (18 mcg total) into inhaler and inhale daily. 30 capsule 5  . torsemide (DEMADEX) 20 MG tablet Take 1 tablet by mouth daily, you may take 1 extra tablet daily by mouth only as needed 45 tablet 3  . traMADol (ULTRAM) 50 MG tablet Take 1 tablet (50 mg total) by mouth every 8 (eight) hours as needed for moderate pain. Maximum 6 tabs per day. 21 tablet 0  . triamcinolone (KENALOG) 0.025 % ointment APPLY 1 APPLICATION TOPICALLY 2 (TWO) TIMES DAILY AS NEEDED. 80 g 0   No current facility-administered medications for this visit.    Allergies:   Bupropion    Social History:  The patient  reports that she has been smoking cigarettes. She started smoking about 43 years ago. She has a 42.00 pack-year smoking history. She has never used smokeless tobacco. She reports current alcohol use. She reports that she does not use drugs.   Family History:  The patient's family history includes COPD in her mother and sister; Cancer in her sister; Hypertension in her father, mother, sister, and sister.    ROS:  General:no colds or fevers, some weight loss Skin:no rashes or ulcers HEENT:no blurred vision, no congestion CV:see HPI PUL:see HPI GI:no diarrhea  constipation or melena, no indigestion GU:no hematuria, no dysuria MS:no joint pain, no claudication Neuro:no syncope, no lightheadedness Endo:+ diabetes stable and well controlled , no thyroid disease  Wt Readings from Last 3 Encounters:  03/05/20 279 lb 6.4 oz (126.7 kg)  02/17/20 283 lb 2 oz (128.4 kg)  02/02/20 283 lb (128.4 kg)     PHYSICAL EXAM: VS:  BP (!) 150/110   Pulse 80   Ht 5' 3"  (1.6 m)   Wt 279 lb 6.4 oz (126.7 kg)   LMP 04/06/2017   SpO2 90%   BMI 49.49 kg/m  , BMI Body Barnes index is 49.49 kg/m. General:Pleasant affect, NAD Skin:Warm and dry, brisk capillary refill HEENT:normocephalic, sclera clear, mucus membranes moist Neck:supple, no JVD, no bruits  Heart:S1S2 RRR without murmur, gallup, rub or click Lungs:clear without rales, rhonchi, or wheezes WPV:XYIA, non tender, + BS, do not palpate liver spleen or masses Ext:no lower ext edema, 2+ pedal pulses, 2+ radial pulses Neuro:alert and oriented X 3, MAE, follows commands, + facial symmetry    EKG:  EKG is ordered today. The ekg ordered today demonstrates SR at 80 and no changes from 04/07/19   Recent Labs: 03/31/2019: Magnesium 1.9 04/11/2019: TSH 3.260 07/28/2019: ALT 15; Hemoglobin 16.4; Platelets 265 02/17/2020: BUN 12; Creatinine, Ser 0.99; Potassium 4.2; Sodium 140    Lipid Panel    Component Value Date/Time   CHOL 152 09/20/2018 1116   TRIG 179 (H) 09/20/2018 1116   HDL 43 09/20/2018 1116   CHOLHDL 3.5 09/20/2018 1116   CHOLHDL 4.1 07/02/2017 0652   VLDL 23 07/02/2017 0652   LDLCALC 73 09/20/2018 1116       Other studies Reviewed: Additional studies/ records that were reviewed today include: . Echo 10/23/16 Study Conclusions   - Left ventricle: The cavity size is small. Wall thickness was  increased in a pattern of severe LVH. Systolic function was  vigorous. The estimated ejection fraction was 75%. Wall motion  was normal; there were no regional wall motion abnormalities.    Doppler parameters are consistent with abnormal left ventricular  relaxation (grade 1 diastolic dysfunction). The E/e&' ratio is  >  15, suggesting elevated LV filling pressure.  - Left atrium: The atrium was normal in size.  - Inferior vena cava: The vessel was normal in size. The  respirophasic diameter changes were in the normal range (>= 50%),  consistent with normal central venous pressure.   Impressions:   - Compard to a prior study in 2015, the LVEF is higher at 75%.  There is now severe LV wall thickening, diastolic dysfunction and  elevated LV filling pressure.   ASSESSMENT AND PLAN:  1.  Chronic diastolic HF with LVH, BP poorly controlled, begin hydralazine 10 mg BID and take her lisinopril she has not had today.  BB slow HR to 40s, though she is tolerating pindolol.  Amlodipine caused increased swelling.  Some atypical chest pain if still present with BP control may consider stress test.  Also once BP improved, may need recheck Echo.    2.  Tobacco use discussed cutting back further.  important to stop    3.  Obesity - wt loss given info on DASH diet discussed exercise as well.    4.  HTN poorly controlled see #1.  Follow up in 4 weeks most likely will increase hydralazine.    5.  Diabetes type 2 per her PCP well controlled  6.  Sleep apnea on CPAP  7. Lower ext edema improved after taking extra torsemide.    8.  HLD on statin will check lipids on next visit fasting.  Continue statin.   Current medicines are reviewed with the patient today.  The patient Has no concerns regarding medicines.  The following changes have been made:  See above Labs/ tests ordered today include:see above  Disposition:   FU:  see above  Signed, [name], NP  03/05/2020 12:11 PM    [facility] [place], [place], [place] [place], [place] Phone: [phone]; Fax: [phone]

## 2020-03-05 ENCOUNTER — Encounter: Payer: Self-pay | Admitting: Cardiology

## 2020-03-05 ENCOUNTER — Ambulatory Visit: Payer: Medicare HMO | Admitting: Cardiology

## 2020-03-05 ENCOUNTER — Ambulatory Visit
Admission: RE | Admit: 2020-03-05 | Discharge: 2020-03-05 | Disposition: A | Discharge status: Home / Self Care | Payer: Medicare HMO | Source: Ambulatory Visit | Attending: Family Medicine | Admitting: Family Medicine

## 2020-03-05 ENCOUNTER — Other Ambulatory Visit: Payer: Self-pay

## 2020-03-05 VITALS — BP 150/110 | HR 80 | Ht 63.0 in | Wt 279.4 lb

## 2020-03-05 DIAGNOSIS — Z720 Tobacco use: Secondary | ICD-10-CM | POA: Diagnosis not present

## 2020-03-05 DIAGNOSIS — F17200 Nicotine dependence, unspecified, uncomplicated: Secondary | ICD-10-CM

## 2020-03-05 DIAGNOSIS — E119 Type 2 diabetes mellitus without complications: Secondary | ICD-10-CM

## 2020-03-05 DIAGNOSIS — F17210 Nicotine dependence, cigarettes, uncomplicated: Secondary | ICD-10-CM | POA: Diagnosis not present

## 2020-03-05 DIAGNOSIS — I10 Essential (primary) hypertension: Secondary | ICD-10-CM

## 2020-03-05 DIAGNOSIS — E785 Hyperlipidemia, unspecified: Secondary | ICD-10-CM | POA: Diagnosis not present

## 2020-03-05 DIAGNOSIS — I5032 Chronic diastolic (congestive) heart failure: Secondary | ICD-10-CM | POA: Diagnosis not present

## 2020-03-05 DIAGNOSIS — R609 Edema, unspecified: Secondary | ICD-10-CM

## 2020-03-05 MED ORDER — HYDRALAZINE HCL 10 MG PO TABS: 10.0000 mg | ORAL_TABLET | Freq: Two times a day (BID) | ORAL | 3 refills | Status: DC

## 2020-03-05 MED ORDER — LISINOPRIL 40 MG PO TABS: 40.0000 mg | ORAL_TABLET | Freq: Every day | ORAL | 3 refills | Status: DC

## 2020-03-05 NOTE — Patient Instructions (Addendum)
Medication Instructions:  Your physician has recommended you make the following change in your medication:  1-START Hydralazine 10 mg by mouth twice daily  *If you need a refill on your cardiac medications before your next appointment, please call your pharmacy*  Lab Work: Your physician recommends that you return for lab work in: with follow up appointment- fasting lipid and liver panel.  If you have labs (blood work) drawn today and your tests are completely normal, you will receive your results only by: Marland Kitchen MyChart Message (if you have MyChart) OR . A paper copy in the mail If you have any lab test that is abnormal or we need to change your treatment, we will call you to review the results.  Testing/Procedures: None ordered today.  Follow-Up: At [facility], you and your health needs are our priority.  As part of our continuing mission to provide you with exceptional heart care, we have created designated Provider Care Teams.  These Care Teams include your primary Cardiologist (physician) and Advanced Practice Providers (APPs -  Physician Assistants and Nurse Practitioners) who all work together to provide you with the care you need, when you need it.  We recommend signing up for the patient portal called "MyChart".  Sign up information is provided on this After Visit Summary.  MyChart is used to connect with patients for Virtual Visits (Telemedicine).  Patients are able to view lab/test results, encounter notes, upcoming appointments, etc.  Non-urgent messages can be sent to your provider as well.   To learn more about what you can do with MyChart, go to [email or web address].    Your next appointment:   1 month(s)  The format for your next appointment:   In Person  Provider:   You may see Candee Furbish, MD or one of the following Advanced Practice Providers on your designated Care Team:    Truitt Merle, NP  [name], NP  Kathyrn Drown, NP   DASH Eating Plan DASH  stands for "Dietary Approaches to Stop Hypertension." The DASH eating plan is a healthy eating plan that has been shown to reduce high blood pressure (hypertension). It may also reduce your risk for type 2 diabetes, heart disease, and stroke. The DASH eating plan may also help with weight loss. What are tips for following this plan?  General guidelines  Avoid eating more than 2,300 mg (milligrams) of salt (sodium) a day. If you have hypertension, you may need to reduce your sodium intake to 1,500 mg a day.  Limit alcohol intake to no more than 1 drink a day for nonpregnant women and 2 drinks a day for men. One drink equals 12 oz of beer, 5 oz of wine, or 1 oz of hard liquor.  Work with your health care provider to maintain a healthy body weight or to lose weight. Ask what an ideal weight is for you.  Get at least 30 minutes of exercise that causes your heart to beat faster (aerobic exercise) most days of the week. Activities may include walking, swimming, or biking.  Work with your health care provider or diet and nutrition specialist (dietitian) to adjust your eating plan to your individual calorie needs. Reading food labels   Check food labels for the amount of sodium per serving. Choose foods with less than 5 percent of the Daily Value of sodium. Generally, foods with less than 300 mg of sodium per serving fit into this eating plan.  To find whole grains, look for the word "whole" as  the first word in the ingredient list. Shopping  Buy products labeled as "low-sodium" or "no salt added."  Buy fresh foods. Avoid canned foods and premade or frozen meals. Cooking  Avoid adding salt when cooking. Use salt-free seasonings or herbs instead of table salt or sea salt. Check with your health care provider or pharmacist before using salt substitutes.  Do not fry foods. Cook foods using healthy methods such as baking, boiling, grilling, and broiling instead.  Cook with heart-healthy oils,  such as olive, canola, soybean, or sunflower oil. Meal planning  Eat a balanced diet that includes: ? 5 or more servings of fruits and vegetables each day. At each meal, try to fill half of your plate with fruits and vegetables. ? Up to 6-8 servings of whole grains each day. ? Less than 6 oz of lean meat, poultry, or fish each day. A 3-oz serving of meat is about the same size as a deck of cards. One egg equals 1 oz. ? 2 servings of low-fat dairy each day. ? A serving of nuts, seeds, or beans 5 times each week. ? Heart-healthy fats. Healthy fats called Omega-3 fatty acids are found in foods such as flaxseeds and coldwater fish, like sardines, salmon, and mackerel.  Limit how much you eat of the following: ? Canned or prepackaged foods. ? Food that is high in trans fat, such as fried foods. ? Food that is high in saturated fat, such as fatty meat. ? Sweets, desserts, sugary drinks, and other foods with added sugar. ? Full-fat dairy products.  Do not salt foods before eating.  Try to eat at least 2 vegetarian meals each week.  Eat more home-cooked food and less restaurant, buffet, and fast food.  When eating at a restaurant, ask that your food be prepared with less salt or no salt, if possible. What foods are recommended? The items listed may not be a complete list. Talk with your dietitian about what dietary choices are best for you. Grains Whole-grain or whole-wheat bread. Whole-grain or whole-wheat pasta. Brown rice. [place]. Bulgur. Whole-grain and low-sodium cereals. Pita bread. Low-fat, low-sodium crackers. Whole-wheat flour tortillas. Vegetables Fresh or frozen vegetables (raw, steamed, roasted, or grilled). Low-sodium or reduced-sodium tomato and vegetable juice. Low-sodium or reduced-sodium tomato sauce and tomato paste. Low-sodium or reduced-sodium canned vegetables. Fruits All fresh, dried, or frozen fruit. Canned fruit in natural juice (without added sugar). Meat  and other protein foods Skinless chicken or [place]. Ground chicken or [place]. Pork with fat trimmed off. Fish and seafood. Egg whites. Dried beans, peas, or lentils. Unsalted nuts, nut butters, and seeds. Unsalted canned beans. Lean cuts of beef with fat trimmed off. Low-sodium, lean deli meat. Dairy Low-fat (1%) or fat-free (skim) milk. Fat-free, low-fat, or reduced-fat cheeses. Nonfat, low-sodium ricotta or cottage cheese. Low-fat or nonfat yogurt. Low-fat, low-sodium cheese. Fats and oils Soft margarine without trans fats. Vegetable oil. Low-fat, reduced-fat, or light mayonnaise and salad dressings (reduced-sodium). Canola, safflower, olive, soybean, and sunflower oils. Avocado. Seasoning and other foods Herbs. Spices. Seasoning mixes without salt. Unsalted popcorn and pretzels. Fat-free sweets. What foods are not recommended? The items listed may not be a complete list. Talk with your dietitian about what dietary choices are best for you. Grains Baked goods made with fat, such as croissants, muffins, or some breads. Dry pasta or rice meal packs. Vegetables Creamed or fried vegetables. Vegetables in a cheese sauce. Regular canned vegetables (not low-sodium or reduced-sodium). Regular canned tomato sauce and paste (not  low-sodium or reduced-sodium). Regular tomato and vegetable juice (not low-sodium or reduced-sodium). Angie Fava. Olives. Fruits Canned fruit in a light or heavy syrup. Fried fruit. Fruit in cream or butter sauce. Meat and other protein foods Fatty cuts of meat. Ribs. Fried meat. Berniece Salines. Sausage. Bologna and other processed lunch meats. Salami. Fatback. Hotdogs. Bratwurst. Salted nuts and seeds. Canned beans with added salt. Canned or smoked fish. Whole eggs or egg yolks. Chicken or [place] with skin. Dairy Whole or 2% milk, cream, and half-and-half. Whole or full-fat cream cheese. Whole-fat or sweetened yogurt. Full-fat cheese. Nondairy creamers. Whipped toppings. Processed cheese and  cheese spreads. Fats and oils Butter. Stick margarine. Lard. Shortening. Ghee. Bacon fat. Tropical oils, such as coconut, palm kernel, or palm oil. Seasoning and other foods Salted popcorn and pretzels. Onion salt, garlic salt, seasoned salt, table salt, and sea salt. Worcestershire sauce. Tartar sauce. Barbecue sauce. Teriyaki sauce. Soy sauce, including reduced-sodium. Steak sauce. Canned and packaged gravies. Fish sauce. Oyster sauce. Cocktail sauce. Horseradish that you find on the shelf. Ketchup. Mustard. Meat flavorings and tenderizers. Bouillon cubes. Hot sauce and Tabasco sauce. Premade or packaged marinades. Premade or packaged taco seasonings. Relishes. Regular salad dressings. Where to find more information:  National Heart, Lung, and [place]: https://wilson-eaton.com/  American Heart Association: www.heart.org Summary  The DASH eating plan is a healthy eating plan that has been shown to reduce high blood pressure (hypertension). It may also reduce your risk for type 2 diabetes, heart disease, and stroke.  With the DASH eating plan, you should limit salt (sodium) intake to 2,300 mg a day. If you have hypertension, you may need to reduce your sodium intake to 1,500 mg a day.  When on the DASH eating plan, aim to eat more fresh fruits and vegetables, whole grains, lean proteins, low-fat dairy, and heart-healthy fats.  Work with your health care provider or diet and nutrition specialist (dietitian) to adjust your eating plan to your individual calorie needs. This information is not intended to replace advice given to you by your health care provider. Make sure you discuss any questions you have with your health care provider. Document Revised: 09/18/2017 Document Reviewed: 09/29/2016 Elsevier Patient Education  2020 [employer].

## 2020-03-06 DIAGNOSIS — G4733 Obstructive sleep apnea (adult) (pediatric): Secondary | ICD-10-CM | POA: Diagnosis not present

## 2020-03-08 ENCOUNTER — Other Ambulatory Visit: Payer: Self-pay | Admitting: Sports Medicine

## 2020-03-08 DIAGNOSIS — E790 Hyperuricemia without signs of inflammatory arthritis and tophaceous disease: Secondary | ICD-10-CM

## 2020-03-12 ENCOUNTER — Encounter: Payer: Self-pay | Admitting: Family Medicine

## 2020-03-24 ENCOUNTER — Ambulatory Visit (INDEPENDENT_AMBULATORY_CARE_PROVIDER_SITE_OTHER): Payer: Medicare HMO

## 2020-03-24 ENCOUNTER — Encounter (HOSPITAL_COMMUNITY): Payer: Self-pay

## 2020-03-24 ENCOUNTER — Other Ambulatory Visit: Payer: Self-pay

## 2020-03-24 ENCOUNTER — Ambulatory Visit (HOSPITAL_COMMUNITY)
Admission: EM | Admit: 2020-03-24 | Discharge: 2020-03-24 | Disposition: A | Discharge status: Home / Self Care | Payer: Medicare HMO | Attending: Urgent Care | Admitting: Urgent Care

## 2020-03-24 DIAGNOSIS — Z7982 Long term (current) use of aspirin: Secondary | ICD-10-CM | POA: Diagnosis not present

## 2020-03-24 DIAGNOSIS — E118 Type 2 diabetes mellitus with unspecified complications: Secondary | ICD-10-CM | POA: Diagnosis not present

## 2020-03-24 DIAGNOSIS — I5032 Chronic diastolic (congestive) heart failure: Secondary | ICD-10-CM | POA: Insufficient documentation

## 2020-03-24 DIAGNOSIS — J449 Chronic obstructive pulmonary disease, unspecified: Secondary | ICD-10-CM

## 2020-03-24 DIAGNOSIS — E785 Hyperlipidemia, unspecified: Secondary | ICD-10-CM | POA: Diagnosis not present

## 2020-03-24 DIAGNOSIS — Z794 Long term (current) use of insulin: Secondary | ICD-10-CM | POA: Diagnosis not present

## 2020-03-24 DIAGNOSIS — Z20822 Contact with and (suspected) exposure to covid-19: Secondary | ICD-10-CM | POA: Diagnosis not present

## 2020-03-24 DIAGNOSIS — F17210 Nicotine dependence, cigarettes, uncomplicated: Secondary | ICD-10-CM | POA: Diagnosis not present

## 2020-03-24 DIAGNOSIS — Z79899 Other long term (current) drug therapy: Secondary | ICD-10-CM | POA: Insufficient documentation

## 2020-03-24 DIAGNOSIS — R0602 Shortness of breath: Secondary | ICD-10-CM | POA: Insufficient documentation

## 2020-03-24 DIAGNOSIS — I10 Essential (primary) hypertension: Secondary | ICD-10-CM

## 2020-03-24 DIAGNOSIS — J029 Acute pharyngitis, unspecified: Secondary | ICD-10-CM | POA: Insufficient documentation

## 2020-03-24 DIAGNOSIS — I110 Hypertensive heart disease with heart failure: Secondary | ICD-10-CM | POA: Insufficient documentation

## 2020-03-24 DIAGNOSIS — M170 Bilateral primary osteoarthritis of knee: Secondary | ICD-10-CM | POA: Insufficient documentation

## 2020-03-24 LAB — SARS CORONAVIRUS 2 (TAT 6-24 HRS): SARS Coronavirus 2: NEGATIVE

## 2020-03-24 LAB — POCT RAPID STREP A: Streptococcus, Group A Screen (Direct): NEGATIVE

## 2020-03-24 MED ORDER — CEPACOL SORE THROAT 5.4 MG MT LOZG
1.0000 | LOZENGE | OROMUCOSAL | 0 refills | Status: DC | PRN
Start: 2020-03-24 — End: 2020-04-09

## 2020-03-24 MED ORDER — ACETAMINOPHEN 325 MG PO TABS
650.0000 mg | ORAL_TABLET | Freq: Four times a day (QID) | ORAL | 0 refills | Status: DC | PRN
Start: 2020-03-24 — End: 2021-05-14

## 2020-03-24 MED ORDER — ALBUTEROL SULFATE HFA 108 (90 BASE) MCG/ACT IN AERS: 1.0000 | INHALATION_SPRAY | Freq: Four times a day (QID) | RESPIRATORY_TRACT | 0 refills | Status: DC | PRN

## 2020-03-24 NOTE — Discharge Instructions (Signed)
Your EKG and chest x-ray were normal here today and reassuring.  The rapid strep was negative, we will wait for throat culture before starting any antibiotics.  I want you to take Tylenol and use the Cepacol lozenges for sore throat.  Drink soothing liquids  Continue use your albuterol as needed for the shortness of breath feeling, if this worsens or does not respond albuterol please return or report to the emergency department.  If you experience chest pain with the shortness of breath you should immediately report to the emergency department.  Please follow-up with your primary care and cardiologist early next week to discuss your blood pressure readings.

## 2020-03-24 NOTE — ED Triage Notes (Signed)
Pt c/o sore throat and dysphagiax4 days. Pt states grandson recently had strept throat.

## 2020-03-24 NOTE — ED Provider Notes (Signed)
[place]    CSN: [id number] Arrival date & time: 03/24/20  1015      History   Chief Complaint Chief Complaint  Patient presents with  . Sore Throat    HPI Hailey Barnes is a 59 y.o. female.   Patient with history of COPD, CHF and restrictive lung disease presents for evaluation of sore throat and painful swallowing for the last 3 to 4 days.  She reports symptoms started with sore throat around 4 days ago and have progressively gotten a little worse over time.  She reports now it is painful to swallow.  She reports she received her first Covid vaccine, the Moderna vaccine yesterday.  She reports reports since then she thinks things may have gotten a little worse.  She denies fever or chills.  She denies cough.  Denies headache.  She has had some mild nausea that started yesterday after the Covid vaccine.  She has had no vomiting.  No diarrhea.  She has had a chronic runny nose throughout the spring.  She does report that her grandson had strep throat last week and she keeps him.  She reports she has had shortness of breath the last 3 days.  She reports she has used her albuterol inhaler for this with good response.  However she notes last night she was unable to use her CPAP machine to sleep as she felt like she was struggling to breathe.  She does report this her throat while wearing it.  She has not had any chest pain associated with shortness of breath or at all.  She denies any lower extremity swelling.  She reports has been compliant with all of her medications.  She is concerned about her blood pressure being elevated today, she had recent changes with her cardiologist and primary care for blood pressure.  She does not endorse a little bit of dizziness today.  She again endorses lack of cough.       Past Medical History:  Diagnosis Date  . Arthritis    "knees" (02/21/2014), back  . CHF (congestive heart failure) ([place])   . Chronic bronchitis ([place])    "get it q  yr" (02/21/2014)  . Chronic diastolic CHF (congestive heart failure) ([place])    a. Echo 10/23/2016: EF 75 %  . Chronic lower back pain   . Complication of anesthesia    "I don't come out well; I chew on my tongue"  . COPD (chronic obstructive pulmonary disease) ([place])   . Diabetes mellitus without complication ([place])    TYPE 2  . Family history of adverse reaction to anesthesia    BROTHER PONV, had a blood clot several days later and heart stopped  . Fatty liver    NONALCOLIC  . Fibroid   . Heart murmur   . Hypertension   . Infection of right eye    current dx on Saturday 7/15 - using drops  . Leg swelling    bilateral  . Migraine 1982-2009  . MVP (mitral valve prolapse)   . Neuromuscular disorder ([place])    right leg nerve pain  . Shortness of breath    WITH EXERTION  . Sinus pause    a. noted on telemetry during admission from 10/2016, lasting up to 4.4 seconds. BB discontinued.   . Sleep apnea 02/2016   CPAP 16 TO 20  . Stroke [facility]) noted on CAT 02/2014   "light", LLE weakness remains (03/30/2014)  . 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 Tingling in extremities 12/12/2010   Uric acid and electrolytes (02/23) normal but WBC elevated.   D/Dx: carpal tunnel, ulnar neuropathy Less likely: cervical radiculopathy, vasculitis     Patient Active Problem List   Diagnosis Date Noted  . COPD (chronic obstructive pulmonary disease) ([place]) 02/17/2020  . Impingement syndrome, shoulder, right 08/29/2019  . Hyperuricemia 05/10/2019  . Foot pain, right 05/06/2019  . Nodule of skin of right thumb 04/15/2019  . Leg cramps 04/01/2019  . Tinea cruris 03/31/2019  . Lack of appetite 03/15/2019  . Wart on thumb 09/23/2018  . Acute medial meniscus tear of left knee 08/16/2018  . Plica of knee, left 41/96/2229  . Chondromalacia, left knee 08/16/2018  . Primary osteoarthritis of both knees 03/24/2018  . Osteoarthritis of carpometacarpal (CMC)  joint of thumb 11/16/2017  . De Quervain's tenosynovitis, right 11/02/2017  . Carpal tunnel syndrome of left wrist 10/21/2017  . Vaginal itching 09/22/2017  . Dysuria 09/22/2017  . High risk sexual behavior 08/27/2017  . Benign neoplasm of sigmoid colon   . Insomnia 02/17/2017  . OSA (obstructive sleep apnea) 10/30/2016  . Sinus pause 10/23/2016  . Panic attack 10/22/2016  . Lower extremity edema   . Left lumbar radiculopathy 06/02/2016  . Hyperlipidemia 02/07/2016  . T2DM (type 2 diabetes mellitus) ([place]) 12/07/2015  . Occipital lymphadenopathy 09/21/2015  . Depression 04/19/2015  . Eczema 08/01/2014  . Diminished vision 06/23/2014  . Chronic diastolic CHF (congestive heart failure) ([place]) 06/13/2014  . Restrictive lung disease 05/10/2014  . Vaginal discharge 03/22/2014  . NASH (nonalcoholic steatohepatitis) 01/04/2014  . Hypertension 05/26/2010  . Morbid obesity ([place]) 12/17/2006  . Tobacco use disorder 12/17/2006    Past Surgical History:  Procedure Laterality Date  . [place]  . CHOLECYSTECTOMY N/A 06/17/2017   Procedure: LAPAROSCOPIC CHOLECYSTECTOMY WITH INTRAOPERATIVE CHOLANGIOGRAM;  Surgeon: Donnie Mesa, MD;  Location: [facility];  Service: General;  Laterality: N/A;  . COLONOSCOPY WITH PROPOFOL N/A 04/28/2017   Procedure: COLONOSCOPY WITH PROPOFOL;  Surgeon: Manus Gunning, MD;  Location: [facility];  Service: Gastroenterology;  Laterality: N/A;  . DILATATION & CURETTAGE/HYSTEROSCOPY WITH MYOSURE N/A 05/06/2016   Procedure: DILATATION & CURETTAGE/HYSTEROSCOPY WITH MYOSURE;  Surgeon: Terrance Mass, MD;  Location: [facility];  Service: Gynecology;  Laterality: N/A;  request to follow around 8:45  requests one hour OR time  . DILATATION & CURETTAGE/HYSTEROSCOPY WITH MYOSURE N/A 04/03/2017   Procedure: DILATATION & CURETTAGE/HYSTEROSCOPY WITH MYOSURE;  Surgeon: Terrance Mass, MD;  Location: [facility];  Service: Gynecology;  Laterality: N/A;  . DILATION AND  CURETTAGE OF UTERUS    . ESOPHAGOGASTRODUODENOSCOPY (EGD) WITH PROPOFOL N/A 04/28/2017   Procedure: ESOPHAGOGASTRODUODENOSCOPY (EGD) WITH PROPOFOL;  Surgeon: Manus Gunning, MD;  Location: [facility];  Service: Gastroenterology;  Laterality: N/A;  . FOOT SURGERY Bilateral    "took bones out; put pins in"  . IR ABLATE LIVER CRYOABLATION  02/08/2020  . IR RADIOLOGIST EVAL & MGMT  02/03/2020  . KNEE ARTHROSCOPY Left 08/16/2018   Procedure: ARTHROSCOPY KNEE PARTIAL MEDIAL MENISECTOMY, CHONDROPLASTY MEDIAL AND LATERAL, PLICA RELEASE MEDIAL;  Surgeon: Dorna Leitz, MD;  Location: [facility];  Service: Orthopedics;  Laterality: Left;  . LEFT AND RIGHT HEART CATHETERIZATION WITH CORONARY ANGIOGRAM N/A 03/31/2014   Procedure: LEFT AND RIGHT HEART CATHETERIZATION WITH CORONARY ANGIOGRAM;  Surgeon: Birdie Riddle, MD;  Location: [facility];  Service: Cardiovascular;  Laterality: N/A;  .  MYOMECTOMY  YRS AGO  . sonohystogram  04/14/2016   [facility]: intramural fibroid, premenopausal endometrium, right fluid filled tubular structure    OB History    Gravida  1   Para  1   Term      Preterm      AB      Living  1     SAB      TAB      Ectopic      Multiple      Live Births               Home Medications    Prior to Admission medications   Medication Sig Start Date End Date Taking? Authorizing Provider  aspirin EC 81 MG tablet Take 81 mg by mouth daily.   Yes [provider]  acetaminophen (TYLENOL) 325 MG tablet Take 2 tablets (650 mg total) by mouth every 6 (six) hours as needed. 03/24/20   Ivor Kishi, Marguerita Beards, PA-C  albuterol (VENTOLIN HFA) 108 (90 Base) MCG/ACT inhaler Inhale 1-2 puffs into the lungs every 6 (six) hours as needed for wheezing or shortness of breath. 03/24/20   Mayline Dragon, Marguerita Beards, PA-C  allopurinol (ZYLOPRIM) 300 MG tablet TAKE 1 TABLET BY MOUTH EVERY DAY 03/08/20   Silverio Decamp, MD  atorvastatin (LIPITOR) 40 MG tablet Take 40 mg  by mouth once a day 04/21/19   [name], DO  Blood Glucose Monitoring Suppl (ACCU-CHEK AVIVA) device Use as instructed 03/31/19 03/30/20  Zenia Resides, MD  BREO ELLIPTA 200-25 MCG/INH AEPB TAKE 1 PUFF BY MOUTH EVERY DAY 01/02/20   [name], DO  empagliflozin (JARDIANCE) 25 MG TABS tablet Take 25 mg by mouth daily. 04/11/19   [name], DO  glucose blood (ACCU-CHEK AVIVA) test strip Use to check sugar up to 3 times daily 04/14/19   Zenia Resides, MD  hydrALAZINE (APRESOLINE) 10 MG tablet Take 1 tablet (10 mg total) by mouth in the morning and at bedtime. 03/05/20   Isaiah Serge, NP  Insulin Pen Needle (PEN NEEDLES) 32G X 4 MM MISC 1 each by Does not apply route 3 (three) times daily. 06/06/19 05/31/20  [name], DO  Lancets (ACCU-CHEK SOFT TOUCH) lancets Use as instructed 04/05/19   [name], DO  liraglutide (VICTOZA) 18 MG/3ML SOPN Inject 9.8PJ minus 2 clicks under the skin once weekly. 02/17/20   [name], DO  lisinopril (ZESTRIL) 40 MG tablet Take 1 tablet (40 mg total) by mouth daily. 03/05/20   Isaiah Serge, NP  Menthol (CEPACOL SORE THROAT) 5.4 MG LOZG Use as directed 1 lozenge (5.4 mg total) in the mouth or throat every 2 (two) hours as needed. 03/24/20   Chrystle Murillo, Marguerita Beards, PA-C  metFORMIN (GLUCOPHAGE) 1000 MG tablet Take 1 tablet (1,000 mg total) by mouth 2 (two) times daily with a meal. 01/11/20   [name], DO  pindolol (VISKEN) 10 MG tablet Take 1 tablet (10 mg total) by mouth 2 (two) times daily. 11/10/19   Kathrene Alu, MD  potassium chloride SA (KLOR-CON M20) 20 MEQ tablet Take 1 tablet (20 mEq total) by mouth daily. 10/04/19   [name], DO  tiotropium (SPIRIVA) 18 MCG inhalation capsule Place 1 capsule (18 mcg total) into inhaler and inhale daily. 12/14/18   Benay Pike, MD  torsemide (DEMADEX) 20 MG tablet Take 1 tablet by mouth daily, you may take 1 extra tablet daily by mouth only as needed 12/01/19  Tammi Klippel, Sherin, DO    traMADol (ULTRAM) 50 MG tablet Take 1 tablet (50 mg total) by mouth every 8 (eight) hours as needed for moderate pain. Maximum 6 tabs per day. 08/29/19   Silverio Decamp, MD  triamcinolone (KENALOG) 0.025 % ointment APPLY 1 APPLICATION TOPICALLY 2 (TWO) TIMES DAILY AS NEEDED. 10/11/19   Guadalupe Dawn, MD    Family History Family History  Problem Relation Age of Onset  . Hypertension Mother   . COPD Mother   . Hypertension Father   . Cancer Sister        UTERINE???  . COPD Sister   . Hypertension Sister   . Hypertension Sister     Social History Social History   Tobacco Use  . Smoking status: Current Every Day Smoker    Packs/day: 1.00    Years: 42.00    Pack years: 42.00    Types: Cigarettes    Start date: 10/20/1976  . Smokeless tobacco: Never Used  Substance Use Topics  . Alcohol use: Yes    Comment: very occasional  . Drug use: No    Types: "Crack" cocaine    Comment: 03/30/2014 "stopped using crack 02/21/2014"     Allergies   Bupropion   Review of Systems Review of Systems   Physical Exam Triage Vital Signs ED Triage Vitals  Enc Vitals Group     BP 03/24/20 1058 (!) 171/101     Pulse Rate 03/24/20 1058 78     Resp 03/24/20 1058 16     Temp 03/24/20 1058 98.3 F (36.8 C)     Temp Source 03/24/20 1058 Oral     SpO2 03/24/20 1058 97 %     Weight 03/24/20 1059 280 lb (127 kg)     Height 03/24/20 1059 5' 3"  (1.6 m)     Head Circumference --      Peak Flow --      Pain Score 03/24/20 1058 6     Pain Loc --      Pain Edu? --      Excl. in [place]? --    No data found.  Updated Vital Signs BP (!) 171/101   Pulse 78   Temp 98.3 F (36.8 C) (Oral)   Resp 16   Ht 5' 3"  (1.6 m)   Wt 280 lb (127 kg)   LMP 04/06/2017   SpO2 97%   BMI 49.60 kg/m   Visual Acuity Right Eye Distance:   Left Eye Distance:   Bilateral Distance:    Right Eye Near:   Left Eye Near:    Bilateral Near:     Physical Exam Vitals and nursing note reviewed.   Constitutional:      General: She is not in acute distress.    Appearance: She is well-developed. She is not ill-appearing, toxic-appearing or diaphoretic.  HENT:     Head: Normocephalic and atraumatic.     Right Ear: Tympanic membrane normal.     Left Ear: Tympanic membrane normal.     Mouth/Throat:     Mouth: Mucous membranes are moist. No oral lesions.     Pharynx: Uvula midline. Posterior oropharyngeal erythema present. No pharyngeal swelling, oropharyngeal exudate or uvula swelling.     Tonsils: No tonsillar exudate or tonsillar abscesses.  Eyes:     Conjunctiva/sclera: Conjunctivae normal.  Cardiovascular:     Rate and Rhythm: Normal rate and regular rhythm.     Heart sounds: No murmur.  Pulmonary:  Effort: Pulmonary effort is normal. No respiratory distress.     Breath sounds: Normal breath sounds.  Abdominal:     Palpations: Abdomen is soft.     Tenderness: There is no abdominal tenderness.  Musculoskeletal:     Cervical back: Neck supple.  Lymphadenopathy:     Cervical: No cervical adenopathy.  Skin:    General: Skin is warm and dry.  Neurological:     Mental Status: She is alert.      UC Treatments / Results  Labs (all labs ordered are listed, but only abnormal results are displayed) Labs Reviewed  SARS CORONAVIRUS 2 (TAT 6-24 HRS)  CULTURE, GROUP A STREP [facility])  POCT RAPID STREP A    EKG Normal sinus rhythm, there is some low voltage QRS, no significant change since last.  Given no acute changes likely normal EKG for this patient Radiology DG Chest 2 View  Result Date: 03/24/2020 CLINICAL DATA:  Shortness of breath.  COPD. EXAM: CHEST - 2 VIEW COMPARISON:  03/05/2020 CT and chest radiograph of 07/02/2017 FINDINGS: Mild cardiomegaly. The lungs appear clear. No blunting of the costophrenic angles. Mild thoracic spondylosis. Degenerative AC joint spurring bilaterally. IMPRESSION: 1. Mild cardiomegaly. 2. Mild thoracic spondylosis. Electronically Signed    By: Van Clines M.D.   On: 03/24/2020 12:35    Procedures Procedures (including critical care time)  Medications Ordered in UC Medications - No data to display  Initial Impression / Assessment and Plan / UC Course  I have reviewed the triage vital signs and the nursing notes.  Pertinent labs & imaging results that were available during my care of the patient were reviewed by me and considered in my medical decision making (see chart for details).     #Pharyngitis #Shortness of breath #Elevated blood pressure Patient is a 59 year old with history of CHF, COPD and restrictive lung disease presenting primarily for pharyngitis however there is reported shortness of breath.  EKG, lung exam and chest x-ray reassuring.  Patient utilize albuterol inhaler in clinic with relief of chest pressure, also reassuring for likely related to COPD.  Given good response to albuterol and Covid vaccine yesterday will avoid steroid therapy.  Her blood pressure was elevated today, doubt this is causing her symptoms and will have her follow-up with her cardiologist and primary care, for further management of her blood pressure.  Rapid strep was negative, will wait for throat culture prior to antibiotic use.  Covid PCR was also sent.  We will treat symptomatically with Tylenol and Cepacol lozenges.  Return emergency department precautions were discussed.  Patient verbalized understanding of plan of care. Final Clinical Impressions(s) / UC Diagnoses   Final diagnoses:  Pharyngitis, unspecified etiology  Shortness of breath  Elevated blood pressure reading in office with diagnosis of hypertension     Discharge Instructions     Your EKG and chest x-ray were normal here today and reassuring.  The rapid strep was negative, we will wait for throat culture before starting any antibiotics.  I want you to take Tylenol and use the Cepacol lozenges for sore throat.  Drink soothing liquids  Continue use your  albuterol as needed for the shortness of breath feeling, if this worsens or does not respond albuterol please return or report to the emergency department.  If you experience chest pain with the shortness of breath you should immediately report to the emergency department.  Please follow-up with your primary care and cardiologist early next week to discuss your blood pressure  readings.       ED Prescriptions    Medication Sig Dispense Auth. Provider   Menthol (CEPACOL SORE THROAT) 5.4 MG LOZG Use as directed 1 lozenge (5.4 mg total) in the mouth or throat every 2 (two) hours as needed. 30 lozenge Rudy Luhmann, Marguerita Beards, PA-C   acetaminophen (TYLENOL) 325 MG tablet Take 2 tablets (650 mg total) by mouth every 6 (six) hours as needed. 30 tablet Danee Soller, Marguerita Beards, PA-C   albuterol (VENTOLIN HFA) 108 (90 Base) MCG/ACT inhaler Inhale 1-2 puffs into the lungs every 6 (six) hours as needed for wheezing or shortness of breath. 18 g Mael Delap, Marguerita Beards, PA-C     PDMP not reviewed this encounter.   Purnell Shoemaker, PA-C 03/24/20 1916

## 2020-03-26 LAB — CULTURE, GROUP A STREP (THRC)

## 2020-03-27 ENCOUNTER — Telehealth: Payer: Self-pay | Admitting: Family Medicine

## 2020-03-27 NOTE — Chronic Care Management (AMB) (Signed)
  Chronic Care Management   Note  03/27/2020 Name: Hailey Barnes MRN: [id number] DOB: Jan 08, 1961  Hailey Barnes is a 59 y.o. year old female who is a primary care patient of [name], DO. I reached out to [employer] by phone today in response to a referral sent by Hailey Barnes's PCP, [name] DO     Hailey Barnes was given information about Chronic Care Management services today including:  1. CCM service includes personalized support from designated clinical staff supervised by her physician, including individualized plan of care and coordination with other care providers 2. 24/7 contact phone numbers for assistance for urgent and routine care needs. 3. Service will only be billed when office clinical staff spend 20 minutes or more in a month to coordinate care. 4. Only one practitioner may furnish and bill the service in a calendar month. 5. The patient may stop CCM services at any time (effective at the end of the month) by phone call to the office staff. 6. The patient will be responsible for cost sharing (co-pay) of up to 20% of the service fee (after annual deductible is met).  Patient agreed to services and verbal consent obtained.   Follow up plan: Telephone appointment with care management team member scheduled for:04/02/2020  Hailey Durand, LPN Health Advisor, [facility] ??[email or web address] ??[phone]

## 2020-03-28 ENCOUNTER — Other Ambulatory Visit: Payer: Self-pay | Admitting: Family Medicine

## 2020-04-02 ENCOUNTER — Ambulatory Visit: Payer: Medicare HMO

## 2020-04-02 ENCOUNTER — Other Ambulatory Visit: Payer: Self-pay

## 2020-04-02 DIAGNOSIS — I5032 Chronic diastolic (congestive) heart failure: Secondary | ICD-10-CM

## 2020-04-02 DIAGNOSIS — I10 Essential (primary) hypertension: Secondary | ICD-10-CM

## 2020-04-02 NOTE — Chronic Care Management (AMB) (Signed)
Care Management   Initial Visit Note  04/02/2020 Name: Hailey Barnes MRN: [id number] DOB: Mar 19, 1961   Assessment: Hailey Barnes is a 59 y.o. year old female who sees [name], [place] for primary care. The care management team was consulted for assistance with care management and care coordination needs related to Disease Management Educational Needs for HTN/ CHF.   Review of patient status, including review of consultants reports, relevant laboratory and other test results, and collaboration with appropriate care team members and the patient's provider was performed as part of comprehensive patient evaluation and provision of care management services.    SDOH (Social Determinants of Health) assessments performed: Yes See Care Plan activities for detailed interventions related to [facility])      Outpatient Encounter Medications as of 04/02/2020  Medication Sig Note  . acetaminophen (TYLENOL) 325 MG tablet Take 2 tablets (650 mg total) by mouth every 6 (six) hours as needed.   Marland Kitchen albuterol (VENTOLIN HFA) 108 (90 Base) MCG/ACT inhaler Inhale 1-2 puffs into the lungs every 6 (six) hours as needed for wheezing or shortness of breath.   . allopurinol (ZYLOPRIM) 300 MG tablet TAKE 1 TABLET BY MOUTH EVERY DAY   . aspirin EC 81 MG tablet Take 81 mg by mouth daily.   Marland Kitchen atorvastatin (LIPITOR) 40 MG tablet Take 40 mg by mouth once a day   . BREO ELLIPTA 200-25 MCG/INH AEPB TAKE 1 PUFF BY MOUTH EVERY DAY   . empagliflozin (JARDIANCE) 25 MG TABS tablet Take 25 mg by mouth daily.   Marland Kitchen glucose blood (ACCU-CHEK AVIVA) test strip Use to check sugar up to 3 times daily   . hydrALAZINE (APRESOLINE) 10 MG tablet Take 1 tablet (10 mg total) by mouth in the morning and at bedtime.   . Insulin Pen Needle (PEN NEEDLES) 32G X 4 MM MISC 1 each by Does not apply route 3 (three) times daily.   Marland Kitchen KLOR-CON M20 20 MEQ tablet TAKE 1 TABLET BY MOUTH EVERY DAY   . Lancets (ACCU-CHEK SOFT TOUCH) lancets Use as  instructed   . liraglutide (VICTOZA) 18 MG/3ML SOPN Inject 6.9GE minus 2 clicks under the skin once weekly.   Marland Kitchen lisinopril (ZESTRIL) 40 MG tablet Take 1 tablet (40 mg total) by mouth daily.   . metFORMIN (GLUCOPHAGE) 1000 MG tablet Take 1 tablet (1,000 mg total) by mouth 2 (two) times daily with a meal.   . pindolol (VISKEN) 10 MG tablet Take 1 tablet (10 mg total) by mouth 2 (two) times daily.   Marland Kitchen tiotropium (SPIRIVA) 18 MCG inhalation capsule Place 1 capsule (18 mcg total) into inhaler and inhale daily.   Marland Kitchen torsemide (DEMADEX) 20 MG tablet Take 1 tablet by mouth daily, you may take 1 extra tablet daily by mouth only as needed   . triamcinolone (KENALOG) 0.025 % ointment APPLY 1 APPLICATION TOPICALLY 2 (TWO) TIMES DAILY AS NEEDED.   Marland Kitchen Menthol (CEPACOL SORE THROAT) 5.4 MG LOZG Use as directed 1 lozenge (5.4 mg total) in the mouth or throat every 2 (two) hours as needed. (Patient not taking: Reported on 04/02/2020) 04/02/2020: Has not picked  up yet  . traMADol (ULTRAM) 50 MG tablet Take 1 tablet (50 mg total) by mouth every 8 (eight) hours as needed for moderate pain. Maximum 6 tabs per day. (Patient not taking: Reported on 04/02/2020)    No facility-administered encounter medications on file as of 04/02/2020.    Goals Addressed  This Visit's Progress   .  AI dont understand wwhy my BP is High (pt-stated)        CARE PLAN ENTRY (see longtitudinal plan of care for additional care plan information)  Objective:  . Last practice recorded BP readings:  BP Readings from Last 3 Encounters:  03/24/20 (!) 171/101  03/05/20 (!) 150/110  02/17/20 (!) 156/110     Current Barriers:  Marland Kitchen Knowledge Deficits related to basic understanding of hypertension pathophysiology and self care management  Case Manager Clinical Goal(s):  Marland Kitchen Over the next 90 days, patient will demonstrate improved adherence to prescribed treatment plan for hypertension as evidenced by taking all medications as  prescribed, monitoring and recording blood pressure as directed, adhering to low sodium/DASH diet  Interventions:  . Evaluation of current treatment plan related to hypertension self management and patient's adherence to plan as established by provider. . Provided education to patient re: stroke prevention, s/s of heart attack and stroke, DASH diet, complications of uncontrolled blood pressure . Reviewed medications with patient and discussed importance of compliance . Discussed plans with patient for ongoing care management follow up and provided patient with direct contact information for care management team . Advised patient, providing education and rationale, to monitor blood pressure daily and record, calling PCP for findings outside established parameters.  . Advised patient to call insurance company to see if her plan has monthly allowance to provide her wwith a new BP monitor she can order . Sending the patient educational material "a matter of choices Blood Pressure control, calendar,Relaxation techniques for stress, How to read labels for low sodium, and exercise activity booklet . Discussed diet and drinking H2o . Discussed stress, and smoking.  Also discussed about exercise  Patient Self Care Activities:  . UNABLE to independently self manage elevated BP . Self administers medications as prescribed . Attends all scheduled provider appointments . Calls provider office for new concerns, questions, or BP outside discussed parameters  Initial goal documentation          Follow up plan:  The care management team will reach out to the patient again over the next 14 days.  The patient has been provided with contact information for the care management team and has been advised to call with any health related questions or concerns.   [name] was given information about Care Management services today including:  1. Care Management services include personalized support from  designated clinical staff supervised by a physician, including individualized plan of care and coordination with other care providers 2. 24/7 contact phone numbers for assistance for urgent and routine care needs. 3. The patient may stop Care Management services at any time (effective at the end of the month) by phone call to the office staff.  Patient agreed to services and verbal consent obtained.  Lazaro Arms RN, BSN, [facility] Care Management Coordinator [place] Phone: [phone] Fax: [phone]

## 2020-04-02 NOTE — Patient Instructions (Signed)
Visit Information  Goals Addressed              This Visit's Progress   .  AI dont understand wwhy my BP is High (pt-stated)        CARE PLAN ENTRY (see longtitudinal plan of care for additional care plan information)  Objective:  . Last practice recorded BP readings:  BP Readings from Last 3 Encounters:  03/24/20 (!) 171/101  03/05/20 (!) 150/110  02/17/20 (!) 156/110     Current Barriers:  Marland Kitchen Knowledge Deficits related to basic understanding of hypertension pathophysiology and self care management  Case Manager Clinical Goal(s):  Marland Kitchen Over the next 90 days, patient will demonstrate improved adherence to prescribed treatment plan for hypertension as evidenced by taking all medications as prescribed, monitoring and recording blood pressure as directed, adhering to low sodium/DASH diet  Interventions:  . Evaluation of current treatment plan related to hypertension self management and patient's adherence to plan as established by provider. . Provided education to patient re: stroke prevention, s/s of heart attack and stroke, DASH diet, complications of uncontrolled blood pressure . Reviewed medications with patient and discussed importance of compliance . Discussed plans with patient for ongoing care management follow up and provided patient with direct contact information for care management team . Advised patient, providing education and rationale, to monitor blood pressure daily and record, calling PCP for findings outside established parameters.  . Advised patient to call insurance company to see if her plan has monthly allowance to provide her wwith a new BP monitor she can order . Sending the patient educational material "a matter of choices Blood Pressure control, calendar,Relaxation techniques for stress, How to read labels for low sodium, and exercise activity booklet . Discussed diet and drinking H2o . Discussed stress, and smoking.  Also discussed about exercise  Patient  Self Care Activities:  . UNABLE to independently self manage elevated BP . Self administers medications as prescribed . Attends all scheduled provider appointments . Calls provider office for new concerns, questions, or BP outside discussed parameters  Initial goal documentation         Ms. Artz was given information about Care Management services today including:  1. Care Management services include personalized support from designated clinical staff supervised by her physician, including individualized plan of care and coordination with other care providers 2. 24/7 contact phone numbers for assistance for urgent and routine care needs. 3. The patient may stop CCM services at any time (effective at the end of the month) by phone call to the office staff.  Patient agreed to services and verbal consent obtained.   The patient verbalized understanding of instructions provided today and declined a print copy of patient instruction materials.   The care management team will reach out to the patient again over the next 14 days.  The patient has been provided with contact information for the care management team and has been advised to call with any health related questions or concerns.   Lazaro Arms RN, BSN, [facility] Care Management Coordinator [place] Phone: [phone] Fax: [phone]

## 2020-04-04 NOTE — Progress Notes (Signed)
CARDIOLOGY OFFICE NOTE  Date:  04/09/2020    Hailey Barnes Date of Birth: Mar 14, 1961 Medical Record #[id number]  PCP:  [name], DO  Cardiologist:  [facility]   Chief Complaint  Patient presents with  . Follow-up    History of Present Illness: Hailey Barnes is a 59 y.o. female who presents today for a one month check. Seen for Dr. Marlou Porch.   She has a history of chronic diastolic heart failure, COPD, diabetes,  prior stroke, tobacco use and morbid obesity. Had prior thyroid removal. At one point had sinus bradycardia at night with pause of 4.4 seconds. Her beta-blocker was discontinued at that time.  Seen by Dr. Marlou Porch in April with some increase in swelling - wanted to be checked out. Husband had just died. On follow up with Mickel Baas a month ago - had not had her ACE - BP was thus elevated. Atypical chest pain noted. Continued to smoke. Noted to have tried Norvasc but had increased swelling.   The patient does not have symptoms concerning for COVID-19 infection (fever, chills, cough, or new shortness of breath).   Comes in today. Here alone. She has lots of back/OA issues. Has ran out of her Ultram - does not sound like she is using NSAID. BP is still up. She does not check at home - she is waiting on a BP cuff from [facility]. Sounds like she tries to restrict her salt - but using garlic/onion salt/powders. No chest pain. Breathing is ok. She continues to smoke - about a pack a day. Noted to have severe LVH on prior echo with diastolic dysfunction.   Past Medical History:  Diagnosis Date  . Arthritis    "knees" (02/21/2014), back  . CHF (congestive heart failure) ([place])   . Chronic bronchitis ([place])    "get it q yr" (02/21/2014)  . Chronic diastolic CHF (congestive heart failure) ([place])    a. Echo 10/23/2016: EF 75 %  . Chronic lower back pain   . Complication of anesthesia    "I don't come out well; I chew on my tongue"  . COPD (chronic obstructive pulmonary  disease) ([place])   . Diabetes mellitus without complication ([place])    TYPE 2  . Family history of adverse reaction to anesthesia    BROTHER PONV, had a blood clot several days later and heart stopped  . Fatty liver    NONALCOLIC  . Fibroid   . Heart murmur   . Hypertension   . Infection of right eye    current dx on Saturday 7/15 - using drops  . Leg swelling    bilateral  . Migraine 1982-2009  . MVP (mitral valve prolapse)   . Neuromuscular disorder ([place])    right leg nerve pain  . Shortness of breath    WITH EXERTION  . Sinus pause    a. noted on telemetry during admission from 10/2016, lasting up to 4.4 seconds. BB discontinued.   . Sleep apnea 02/2016   CPAP 16 TO 20  . Stroke [facility]) noted on CAT 02/2014   "light", LLE weakness remains (03/30/2014)  . 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 Tingling in extremities 12/12/2010   Uric acid and electrolytes (02/23) normal but WBC elevated.   D/Dx: carpal tunnel, ulnar neuropathy Less likely: cervical radiculopathy, vasculitis     Past Surgical History:  Procedure Laterality Date  . CESAREAN SECTION  [place] N/A 06/17/2017   Procedure: LAPAROSCOPIC CHOLECYSTECTOMY WITH INTRAOPERATIVE CHOLANGIOGRAM;  Surgeon: Donnie Mesa, MD;  Location: [place];  Service: General;  Laterality: N/A;  . COLONOSCOPY WITH PROPOFOL N/A 04/28/2017   Procedure: COLONOSCOPY WITH PROPOFOL;  Surgeon: Manus Gunning, MD;  Location: [facility];  Service: Gastroenterology;  Laterality: N/A;  . DILATATION & CURETTAGE/HYSTEROSCOPY WITH MYOSURE N/A 05/06/2016   Procedure: DILATATION & CURETTAGE/HYSTEROSCOPY WITH MYOSURE;  Surgeon: Terrance Mass, MD;  Location: [place] [facility];  Service: Gynecology;  Laterality: N/A;  request to follow around 8:45  requests one hour OR time  . DILATATION & CURETTAGE/HYSTEROSCOPY WITH MYOSURE N/A 04/03/2017   Procedure: DILATATION & CURETTAGE/HYSTEROSCOPY  WITH MYOSURE;  Surgeon: Terrance Mass, MD;  Location: [place] [facility];  Service: Gynecology;  Laterality: N/A;  . DILATION AND CURETTAGE OF UTERUS    . ESOPHAGOGASTRODUODENOSCOPY (EGD) WITH PROPOFOL N/A 04/28/2017   Procedure: ESOPHAGOGASTRODUODENOSCOPY (EGD) WITH PROPOFOL;  Surgeon: Manus Gunning, MD;  Location: [facility];  Service: Gastroenterology;  Laterality: N/A;  . FOOT SURGERY Bilateral    "took bones out; put pins in"  . IR ABLATE LIVER CRYOABLATION  02/08/2020  . IR RADIOLOGIST EVAL & MGMT  02/03/2020  . KNEE ARTHROSCOPY Left 08/16/2018   Procedure: ARTHROSCOPY KNEE PARTIAL MEDIAL MENISECTOMY, CHONDROPLASTY MEDIAL AND LATERAL, PLICA RELEASE MEDIAL;  Surgeon: Dorna Leitz, MD;  Location: [place];  Service: Orthopedics;  Laterality: Left;  . LEFT AND RIGHT HEART CATHETERIZATION WITH CORONARY ANGIOGRAM N/A 03/31/2014   Procedure: LEFT AND RIGHT HEART CATHETERIZATION WITH CORONARY ANGIOGRAM;  Surgeon: Birdie Riddle, MD;  Location: [place] [facility];  Service: Cardiovascular;  Laterality: N/A;  . MYOMECTOMY  YRS AGO  . sonohystogram  04/14/2016   [facility]: intramural fibroid, premenopausal endometrium, right fluid filled tubular structure     Medications: Current Meds  Medication Sig  . acetaminophen (TYLENOL) 325 MG tablet Take 2 tablets (650 mg total) by mouth every 6 (six) hours as needed.  Marland Kitchen albuterol (VENTOLIN HFA) 108 (90 Base) MCG/ACT inhaler Inhale 1-2 puffs into the lungs every 6 (six) hours as needed for wheezing or shortness of breath.  . allopurinol (ZYLOPRIM) 300 MG tablet TAKE 1 TABLET BY MOUTH EVERY DAY  . aspirin EC 81 MG tablet Take 81 mg by mouth daily.  Marland Kitchen atorvastatin (LIPITOR) 40 MG tablet Take 40 mg by mouth once a day  . BREO ELLIPTA 200-25 MCG/INH AEPB TAKE 1 PUFF BY MOUTH EVERY DAY  . empagliflozin (JARDIANCE) 25 MG TABS tablet Take 25 mg by mouth daily.  Marland Kitchen glucose blood (ACCU-CHEK AVIVA) test strip Use to check sugar up to 3 times daily    . Insulin Pen Needle (PEN NEEDLES) 32G X 4 MM MISC 1 each by Does not apply route 3 (three) times daily.  Marland Kitchen KLOR-CON M20 20 MEQ tablet TAKE 1 TABLET BY MOUTH EVERY DAY  . Lancets (ACCU-CHEK SOFT TOUCH) lancets Use as instructed  . liraglutide (VICTOZA) 18 MG/3ML SOPN Inject 9.2HV minus 2 clicks under the skin once weekly.  Marland Kitchen lisinopril (ZESTRIL) 40 MG tablet Take 1 tablet (40 mg total) by mouth daily.  . metFORMIN (GLUCOPHAGE) 1000 MG tablet Take 1 tablet (1,000 mg total) by mouth 2 (two) times daily with a meal.  . pindolol (VISKEN) 10 MG tablet Take 1 tablet (10 mg total) by mouth 2 (two) times daily.  Marland Kitchen tiotropium (SPIRIVA) 18 MCG inhalation capsule Place 1 capsule (18 mcg total) into inhaler and inhale daily.  Marland Kitchen torsemide (  DEMADEX) 20 MG tablet Take 1 tablet by mouth daily, you may take 1 extra tablet daily by mouth only as needed  . traMADol (ULTRAM) 50 MG tablet Take 1 tablet (50 mg total) by mouth every 8 (eight) hours as needed for moderate pain. Maximum 6 tabs per day.  . triamcinolone (KENALOG) 0.025 % ointment APPLY 1 APPLICATION TOPICALLY 2 (TWO) TIMES DAILY AS NEEDED.  . [DISCONTINUED] hydrALAZINE (APRESOLINE) 10 MG tablet Take 1 tablet (10 mg total) by mouth in the morning and at bedtime.     Allergies: Allergies  Allergen Reactions  . Bupropion Other (See Comments)    Throat soreness and difficulty swallowing.     Social History: The patient  reports that she has been smoking cigarettes. She started smoking about 43 years ago. She has a 42.00 pack-year smoking history. She has never used smokeless tobacco. She reports current alcohol use. She reports that she does not use drugs.   Family History: The patient's family history includes COPD in her mother and sister; Cancer in her sister; Hypertension in her father, mother, sister, and sister.   Review of Systems: Please see the history of present illness.   All other systems are reviewed and negative.   Physical  Exam: VS:  BP (!) 160/110   Pulse 83   Ht 5' 3"  (1.6 m)   Wt 278 lb 12.8 oz (126.5 kg)   LMP 04/06/2017   SpO2 97%   BMI 49.39 kg/m  .  BMI Body mass index is 49.39 kg/m.  Wt Readings from Last 3 Encounters:  04/09/20 278 lb 12.8 oz (126.5 kg)  03/24/20 280 lb (127 kg)  03/05/20 279 lb 6.4 oz (126.7 kg)   BP is 160/110 by me with a large cuff.   General: Alert and in no acute distress.  She is obese.  Cardiac: Regular rate and rhythm. No murmurs, rubs, or gallops. No edema.  Respiratory:  Lungs are fairly clear to auscultation bilaterally with normal work of breathing.  GI: Soft and nontender.  MS: No deformity or atrophy. Gait and ROM intact.  Skin: Warm and dry. Color is normal.  Neuro:  Strength and sensation are intact and no gross focal deficits noted.  Psych: Alert, appropriate and with normal affect.   LABORATORY DATA:  EKG:  EKG is not ordered today.    Lab Results  Component Value Date   WBC 13.5 (H) 07/28/2019   HGB 16.4 (H) 07/28/2019   HCT 50.4 (H) 07/28/2019   PLT 265 07/28/2019   GLUCOSE 87 02/17/2020   CHOL 152 09/20/2018   TRIG 179 (H) 09/20/2018   HDL 43 09/20/2018   LDLCALC 73 09/20/2018   ALT 15 07/28/2019   AST 14 (L) 07/28/2019   NA 140 02/17/2020   K 4.2 02/17/2020   CL 102 02/17/2020   CREATININE 0.99 02/17/2020   BUN 12 02/17/2020   CO2 22 02/17/2020   TSH 3.260 04/11/2019   INR 1.00 06/07/2014   HGBA1C 6.3 02/17/2020   MICROALBUR 30 02/17/2020     BNP (last 3 results) No results for input(s): BNP in the last 8760 hours.  ProBNP (last 3 results) No results for input(s): PROBNP in the last 8760 hours.   Other Studies Reviewed Today:  Echo 10/23/16 Study Conclusions   - Left ventricle: The cavity size is small. Wall thickness was  increased in a pattern of severe LVH. Systolic function was  vigorous. The estimated ejection fraction was 75%. Wall motion  was  normal; there were no regional wall motion abnormalities.   Doppler parameters are consistent with abnormal left ventricular  relaxation (grade 1 diastolic dysfunction). The E/e&' ratio is  >15, suggesting elevated LV filling pressure.  - Left atrium: The atrium was normal in size.  - Inferior vena cava: The vessel was normal in size. The  respirophasic diameter changes were in the normal range (>= 50%),  consistent with normal central venous pressure.   Impressions:   - [place]ard to a prior study in 2015, the LVEF is higher at 75%.  There is now severe LV wall thickening, diastolic dysfunction and  elevated LV filling pressure.   ASSESSMENT AND PLAN:  1.  HTN - not controlled - increasing Hydralazine to 25 mg BID. Would consider Aldactone next. Sodium restriction is imperative.   2. Chronic diastolic HF with LVH - noted prior intolerance to Norvasc due to swelling. Noted prior HR's in the 40's but tolerates current use of pindolol. See #1.   3. Tobacco abuse - she is not ready to stop.   4. LVH - BP control is needed.   5. DM - per PCP  6. OA/chronic back pain  7. Morbid obesity.   8. HLD - on statin - she is to get lab today.   Current medicines are reviewed with the patient today.  The patient does not have concerns regarding medicines other than what has been noted above.  The following changes have been made:  See above.  Labs/ tests ordered today include:   No orders of the defined types were placed in this encounter.    Disposition:   FU with [place] in about 2 to 3 weeks - consider Aldactone next.    Patient is agreeable to this plan and will call if any problems develop in the interim.   SignedTruitt Merle, NP  04/09/2020 11:03 AM  [facility] [address] [place], [place]  [address] Phone: [phone] Fax: [phone]

## 2020-04-09 ENCOUNTER — Other Ambulatory Visit: Payer: Medicare HMO | Admitting: *Deleted

## 2020-04-09 ENCOUNTER — Encounter: Payer: Self-pay | Admitting: Nurse Practitioner

## 2020-04-09 ENCOUNTER — Ambulatory Visit: Payer: Medicare HMO | Admitting: Nurse Practitioner

## 2020-04-09 ENCOUNTER — Other Ambulatory Visit: Payer: Self-pay

## 2020-04-09 VITALS — BP 160/110 | HR 83 | Ht 63.0 in | Wt 278.8 lb

## 2020-04-09 DIAGNOSIS — E785 Hyperlipidemia, unspecified: Secondary | ICD-10-CM

## 2020-04-09 DIAGNOSIS — I10 Essential (primary) hypertension: Secondary | ICD-10-CM

## 2020-04-09 DIAGNOSIS — I5032 Chronic diastolic (congestive) heart failure: Secondary | ICD-10-CM | POA: Diagnosis not present

## 2020-04-09 DIAGNOSIS — R609 Edema, unspecified: Secondary | ICD-10-CM | POA: Diagnosis not present

## 2020-04-09 DIAGNOSIS — Z720 Tobacco use: Secondary | ICD-10-CM | POA: Diagnosis not present

## 2020-04-09 LAB — HEPATIC FUNCTION PANEL
ALT: 21 IU/L (ref 0–32)
AST: 15 IU/L (ref 0–40)
Albumin: 4.3 g/dL (ref 3.8–4.9)
Alkaline Phosphatase: 119 IU/L (ref 48–121)
Bilirubin Total: 0.4 mg/dL (ref 0.0–1.2)
Bilirubin, Direct: 0.11 mg/dL (ref 0.00–0.40)
Total Protein: 6.8 g/dL (ref 6.0–8.5)

## 2020-04-09 LAB — LIPID PANEL
Chol/HDL Ratio: 3.3 ratio (ref 0.0–4.4)
Cholesterol, Total: 137 mg/dL (ref 100–199)
HDL: 42 mg/dL (ref >39)
LDL Chol Calc (NIH): 68 mg/dL (ref 0–99)
Triglycerides: 155 mg/dL — ABNORMAL HIGH (ref 0–149)
VLDL Cholesterol Cal: 27 mg/dL (ref 5–40)

## 2020-04-09 MED ORDER — HYDRALAZINE HCL 25 MG PO TABS: 25.0000 mg | ORAL_TABLET | Freq: Three times a day (TID) | ORAL | 3 refills | Status: DC

## 2020-04-09 NOTE — Patient Instructions (Addendum)
  After Visit Summary:  We will be checking the following labs today - Lipids and LFTs   Medication Instructions:    Continue with your current medicines. BUT  I am going to increase the dose of your Hydralazine to 25 mg twice a day - this is at your pharmacy.    If you need a refill on your cardiac medications before your next appointment, please call your pharmacy.     Testing/Procedures To Be Arranged:  N/A  Follow-Up:   See [place] back in 2 to 3 weeks    At [facility], you and your health needs are our priority.  As part of our continuing mission to provide you with exceptional heart care, we have created designated Provider Care Teams.  These Care Teams include your primary Cardiologist (physician) and Advanced Practice Providers (APPs -  Physician Assistants and Nurse Practitioners) who all work together to provide you with the care you need, when you need it.  Special Instructions:  . Stay safe, wash your hands for at least 20 seconds and wear a mask when needed.  . It was good to talk with you today.  . Switch to Ms. Dash for your spices/seasoning   Call the [place] office at [phone] if you have any questions, problems or concerns.

## 2020-04-10 ENCOUNTER — Telehealth: Payer: Self-pay | Admitting: Nurse Practitioner

## 2020-04-10 ENCOUNTER — Ambulatory Visit (INDEPENDENT_AMBULATORY_CARE_PROVIDER_SITE_OTHER): Payer: Medicare HMO | Admitting: Family Medicine

## 2020-04-10 ENCOUNTER — Encounter: Payer: Self-pay | Admitting: Family Medicine

## 2020-04-10 ENCOUNTER — Ambulatory Visit
Admission: RE | Admit: 2020-04-10 | Discharge: 2020-04-10 | Disposition: A | Discharge status: Home / Self Care | Payer: Medicare HMO | Source: Ambulatory Visit | Attending: Family Medicine | Admitting: Family Medicine

## 2020-04-10 VITALS — BP 142/90 | HR 85

## 2020-04-10 DIAGNOSIS — M542 Cervicalgia: Secondary | ICD-10-CM

## 2020-04-10 DIAGNOSIS — M795 Residual foreign body in soft tissue: Secondary | ICD-10-CM

## 2020-04-10 DIAGNOSIS — R070 Pain in throat: Secondary | ICD-10-CM | POA: Diagnosis not present

## 2020-04-10 MED ORDER — TORSEMIDE 20 MG PO TABS: ORAL_TABLET | ORAL | 0 refills | Status: DC

## 2020-04-10 MED ORDER — LIDOCAINE VISCOUS HCL 2 % MT SOLN
15.0000 mL | OROMUCOSAL | 0 refills | Status: DC | PRN
Start: 2020-04-10 — End: 2020-07-26

## 2020-04-10 NOTE — Telephone Encounter (Signed)
Pt c/o medication issue:  1. Name of Medication: Hydralazine  2. How are you currently taking this medication (dosage and times per day)? Take 3 times a day  3. Are you having a reaction (difficulty breathing--STAT)? no  4. What is your medication issue? Sever pain in her throat, feels like a stab- can not swallow- same thing happen when she was on this medicine before

## 2020-04-10 NOTE — Patient Instructions (Signed)
It was great seeing you today!  I am sorry about your throat pain.  To help with symptoms I am giving you; viscous lidocaine.  It will essentially numb your throat and make the pain bearable.  Since you only had the pain on one side I would like to get an x-ray to better evaluate.  Something like a fishbone or a abscess can cause symptoms like this occasionally.  Once I get the x-ray back I will give you call with results and we will go from there.

## 2020-04-10 NOTE — Telephone Encounter (Signed)
This is not a new medicine - was only a dose increase - I do not see this listed under her allergies.   We can stop.   Would send to HTN clinic for further evaluation.   Cecille Rubin

## 2020-04-10 NOTE — Telephone Encounter (Signed)
S/w pt stated pt's throat has been sore 2-3 weeks, went to urgent care and gave pt tylenol,  and pt has been gargling with salt water, doctor stated  pt's throat was red and beefy.  Pt stated did not say was not going to take medication, pt wanted to know if this medication could cause a sore throat.    Pt is going to stay on increased medication dose and keep f/u appt with Cecille Rubin. Pt has appt today with PCP to check throat. Pt will call back today and leave update on PCP ov today  Will send to [place] to [place].

## 2020-04-10 NOTE — Telephone Encounter (Signed)
Lets stay with the current plan of Hydralazine 25 mg BID.   [facility]

## 2020-04-11 ENCOUNTER — Telehealth: Payer: Self-pay

## 2020-04-11 ENCOUNTER — Encounter: Payer: Self-pay | Admitting: Family Medicine

## 2020-04-11 ENCOUNTER — Other Ambulatory Visit: Payer: Self-pay | Admitting: Family Medicine

## 2020-04-11 DIAGNOSIS — R070 Pain in throat: Secondary | ICD-10-CM | POA: Insufficient documentation

## 2020-04-11 DIAGNOSIS — M795 Residual foreign body in soft tissue: Secondary | ICD-10-CM

## 2020-04-11 NOTE — Telephone Encounter (Signed)
Explained during in-person office visit yesterday with Dr. Kris Mouton.

## 2020-04-11 NOTE — Progress Notes (Signed)
Red team,  Can we get this patient scheduled for a stat ct scan of her neck for a suspected fish bone. Preferably this week if that is available.  Guadalupe Dawn MD PGY-3 Family Medicine Resident

## 2020-04-11 NOTE — Assessment & Plan Note (Addendum)
Patient with unilateral throat pain which has dramatically increased in intensity recently.  Has x-ray finding of 4 mm linear opacity which is highly suspicious for foreign body, including fishbone.  I did prescribe viscous lidocaine.  I informed the patient of her neck x-ray results and recommended a follow-up CT scan of her neck as well as an urgent referral to ENT for further evaluation.  Patient agreed with this management plan and these orders were placed.  I did discuss return precautions and emergency department precautions in the event of a possible perforation.  Other items on the differential are infectious etiology including strep pharyngitis, possible mono, or retropharyngeal abscess these were felt to be less likely given her history and current testing.

## 2020-04-11 NOTE — Progress Notes (Signed)
   CHIEF COMPLAINT / HPI: 59 year old female who presents with unilateral throat pain.  Patient states that her throat pain has been present for approximately 3 weeks.  She was seen at an urgent care on 03/24/2020 and had a negative Covid test, negative group A strep rapid test and culture.  She had a normal chest x-ray and EKG at that time.  She instructed to take Tylenol and Cepacol lozenges.  He says the pain did improve a little bit, but she developed sudden unilateral throat pain that was both different in character, intensity, and distribution than previously overnight on 04/09/2020.  He states that she has found nothing to help the pain and that she is inattentive pain at clinic visit.  Patient has had no other symptoms to suggest infectious etiology.  She does state that she has been eating various displays recently.  She does not feel that she has swallowed a fishbone as she has been eating these fillets.   PERTINENT  PMH / PSH: None   OBJECTIVE: BP (!) 142/90   Pulse 85   LMP 04/06/2017   SpO2 25%   Gen: 59 year old African-American female, no acute distress HEENT: No pharyngeal erythema, no cervical lymphadenopathy.  No exudates, or swollen structures in the posterior pharynx.  There is mild tenderness to palpation of her neck anteriorly. Resp: CTAB, no wheezes, non-labored Neuro: Alert and oriented, Speech clear, No gross deficits  Neck x-ray: 4 mm linear opacity overlying upper esophageal soft tissues at C5, indeterminate for foreign body.  No evidence of retropharyngeal gas collection or prevertebral soft tissue thickness.  ASSESSMENT / PLAN:  Throat pain in adult Patient with unilateral throat pain which has dramatically increased in intensity recently.  Has x-ray finding of 4 mm linear opacity which is highly suspicious for foreign body, including fishbone.  I did prescribe viscous lidocaine.  I informed the patient of her neck x-ray results and recommended a follow-up CT scan  of her neck as well as an urgent referral to ENT for further evaluation.  Patient agreed with this management plan and these orders were placed.  I did discuss return precautions and emergency department precautions in the event of a possible perforation.     Guadalupe Dawn MD PGY-3 Family Medicine Resident [place]

## 2020-04-11 NOTE — Telephone Encounter (Signed)
LVM informing patient of appointment for CT.  04/17/2020 [facility] hospital 1530 1515 arrival time  Centralized scheduling [phone]  .Ozella Almond, CMA

## 2020-04-16 ENCOUNTER — Other Ambulatory Visit: Payer: Self-pay | Admitting: *Deleted

## 2020-04-16 ENCOUNTER — Other Ambulatory Visit: Payer: Self-pay | Admitting: Family Medicine

## 2020-04-16 ENCOUNTER — Other Ambulatory Visit: Payer: Self-pay

## 2020-04-16 ENCOUNTER — Telehealth: Payer: Self-pay | Admitting: Family Medicine

## 2020-04-16 ENCOUNTER — Telehealth: Payer: Self-pay | Admitting: Nurse Practitioner

## 2020-04-16 ENCOUNTER — Ambulatory Visit: Payer: Medicare HMO

## 2020-04-16 DIAGNOSIS — I10 Essential (primary) hypertension: Secondary | ICD-10-CM

## 2020-04-16 DIAGNOSIS — J359 Chronic disease of tonsils and adenoids, unspecified: Secondary | ICD-10-CM

## 2020-04-16 MED ORDER — HYDRALAZINE HCL 25 MG PO TABS: 25.0000 mg | ORAL_TABLET | Freq: Three times a day (TID) | ORAL | 3 refills | Status: DC

## 2020-04-16 MED ORDER — HYDRALAZINE HCL 25 MG PO TABS: 25.0000 mg | ORAL_TABLET | Freq: Two times a day (BID) | ORAL | 3 refills | Status: DC

## 2020-04-16 MED ORDER — EMPAGLIFLOZIN 25 MG PO TABS: 25.0000 mg | ORAL_TABLET | Freq: Every day | ORAL | 3 refills | Status: DC

## 2020-04-16 NOTE — Progress Notes (Signed)
I-stat cr ordered in anticipation of ct neck w/ contrast  Guadalupe Dawn MD PGY-3 Family Medicine Resident

## 2020-04-16 NOTE — Telephone Encounter (Signed)
Pt c/o medication issue:  1. Name of Medication: hydrALAZINE (APRESOLINE) 25 MG tablet  2. How are you currently taking this medication (dosage and times per day)?   3. Are you having a reaction (difficulty breathing--STAT)?   4. What is your medication issue? Traci the nurse case manager at [facility] is calling wanting to know the dosage of how the patient is to take this medication. The AVS states it is increasing to twice a day while the prescription says three. Please advise.

## 2020-04-16 NOTE — Progress Notes (Signed)
Reordered ct scan of neck with contrast per radiology request  Guadalupe Dawn MD PGY-3 Family Medicine Resident

## 2020-04-16 NOTE — Telephone Encounter (Signed)
Have attempted to contact patient's insurance for a peer to peer and been placed on hold for >45 minutes 3 times over the last week. Was instructed to call peer to peer in regards to her ct neck which is scheduled for 6/29  Guadalupe Dawn MD PGY-3 Family Medicine Resident

## 2020-04-16 NOTE — Telephone Encounter (Signed)
S/w Traci case manager to clear up pts medication.  S/w pt about hydralazine.  Pt has been taking hydralazine (25 mg) by mouth tid and feeling good.  Advised with Cecille Rubin pt is to stay on that dose, medication list updated.  Pt has not been able to check bp due to not having a cuff.  If pt does not have cuff by next ov with Lori in two weeks.  This office will give pt a bp cuff.  Pt is aware.

## 2020-04-17 ENCOUNTER — Other Ambulatory Visit: Payer: Self-pay

## 2020-04-17 ENCOUNTER — Ambulatory Visit (HOSPITAL_COMMUNITY)
Admission: RE | Admit: 2020-04-17 | Discharge: 2020-04-17 | Disposition: A | Discharge status: Home / Self Care | Payer: Medicare HMO | Source: Ambulatory Visit | Attending: Family Medicine | Admitting: Family Medicine

## 2020-04-17 DIAGNOSIS — J359 Chronic disease of tonsils and adenoids, unspecified: Secondary | ICD-10-CM | POA: Diagnosis not present

## 2020-04-17 DIAGNOSIS — M542 Cervicalgia: Secondary | ICD-10-CM | POA: Diagnosis not present

## 2020-04-17 DIAGNOSIS — K118 Other diseases of salivary glands: Secondary | ICD-10-CM | POA: Diagnosis not present

## 2020-04-17 LAB — POCT I-STAT CREATININE: Creatinine, Ser: 1.2 mg/dL — ABNORMAL HIGH (ref 0.44–1.00)

## 2020-04-17 MED ORDER — IOHEXOL 300 MG/ML  SOLN
75.0000 mL | Freq: Once | INTRAMUSCULAR | Status: AC | PRN
  Administered 2020-04-17: 75 mL via INTRAVENOUS

## 2020-04-17 NOTE — Patient Instructions (Signed)
Visit Information  Goals Addressed              This Visit's Progress   .  I dont understand why my BP is High (pt-stated)        CARE PLAN ENTRY (see longtitudinal plan of care for additional care plan information)  Objective:  . Last practice recorded BP readings:  BP Readings from Last 3 Encounters:  03/24/20 (!) 171/101  03/05/20 (!) 150/110  02/17/20 (!) 156/110     Current Barriers:  Marland Kitchen Knowledge Deficits related to basic understanding of hypertension pathophysiology and self care management  Case Manager Clinical Goal(s):  Marland Kitchen Over the next 90 days, patient will demonstrate improved adherence to prescribed treatment plan for hypertension as evidenced by taking all medications as prescribed, monitoring and recording blood pressure as directed, adhering to low sodium/DASH diet  Interventions:  . Evaluation of current treatment plan related to hypertension self management and patient's adherence to plan as established by provider. . Provided education to patient re: stroke prevention, s/s of heart attack and stroke, DASH diet, complications of uncontrolled blood pressure . Reviewed medications with patient and discussed importance of compliance . Discussed plans with patient for ongoing care management follow up and provided patient with direct contact information for care management team . Advised patient, providing education and rationale, to monitor blood pressure daily and record, calling PCP for findings outside established parameters.  . Advised patient to call insurance company to see if her plan has monthly allowance to provide her wwith a new BP monitor she can order . Sending the patient educational material "a matter of choices Blood Pressure control, calendar,Relaxation techniques for stress, How to read labels for low sodium, and exercise activity booklet . Discussed diet and drinking H2o . Discussed stress, and smoking.  Also discussed about  exercise . 04/16/20 . Patient received educational material and found it very interesting . The patient called her insurance company and had 90 dollars saved on her card and was able to order her BP monitor . Patient states that she had a visit with cardiology on 6/21 and her medication was increased and she takes it tid. After a review of medication there was a discrepancy RNCM called the office and asked for clarification. Danielle from the office called and clarified the dosage and verified that she would call the patient. . The patient stated that she has an appointment with ENT 04/17/20 due to having a fish bone stuck in her throat and needs to have it removed.  Patient Self Care Activities:  . UNABLE to independently self manage elevated BP . Self administers medications as prescribed . Attends all scheduled provider appointments . Calls provider office for new concerns, questions, or BP outside discussed parameters  Initial goal documentation         Ms. Collignon was given information about Care Management services today including:  1. Care Management services include personalized support from designated clinical staff supervised by her physician, including individualized plan of care and coordination with other care providers 2. 24/7 contact phone numbers for assistance for urgent and routine care needs. 3. The patient may stop CCM services at any time (effective at the end of the month) by phone call to the office staff.  Patient agreed to services and verbal consent obtained.   The patient verbalized understanding of instructions provided today and declined a print copy of patient instruction materials.   The care management team will reach out to the patient  again over the next 14 days.  The patient has been provided with contact information for the care management team and has been advised to call with any health related questions or concerns.   Lazaro Arms RN, BSN, [facility] Care  Management Coordinator [place] Phone: [phone] Fax: [phone]

## 2020-04-17 NOTE — Progress Notes (Signed)
CARDIOLOGY OFFICE NOTE  Date:  05/01/2020    Hailey Barnes Date of Birth: 02-04-1961 Medical Record #[id number]  PCP:  Lurline Del, DO  Cardiologist:  Marisa Cyphers  Chief Complaint  Patient presents with  . Follow-up    History of Present Illness: [facility] Barnes is a 59 y.o. female who presents today for a follow up visit. Seen for Dr. Marlou Porch.   She has a history of chronic diastolic heart failure, COPD, diabetes,  prior stroke, tobacco use and morbid obesity. Had prior thyroid removal. At one point had sinus bradycardia at night with pause of 4.4 seconds. Her beta-blocker was discontinued at that time.  Seen by Dr. Marlou Porch in April with some increase in swelling - wanted to be checked out. Husband had just died. On follow up with Mickel Baas in May - had not had her ACE - BP was thus elevated. Atypical chest pain noted. Continued to smoke. Noted to have tried Norvasc but had increased swelling. Low dose Hydralazine was added.   I then saw her last month - lots of back/OA issues. Using lots of salt spices. Continue to smoke. Severe LVH on prior echo with diastolic dysfunction noted. I increased her Hydralazine.   Comes in today. Here with her grand daughter. She received a BP cuff from her insurance company - she is unsure in how to use. BP has improved with the increase in her Hydralazine and she is tolerating ok. She has since developed a sinus infection - she is on steroids and antibiotics. Can't sleep due to the steroids and admits she has been eating but her weight is down 4 pounds. Some belly discomfort from these medicines as well - has about 6 more days of this regimen. No chest pain. Swelling is stable.   Past Medical History:  Diagnosis Date  . Arthritis    "knees" (02/21/2014), back  . CHF (congestive heart failure) ([place])   . Chronic bronchitis ([place])    "get it q yr" (02/21/2014)  . Chronic diastolic CHF (congestive heart failure) ([place])    a. Echo  10/23/2016: EF 75 %  . Chronic lower back pain   . Complication of anesthesia    "I don't come out well; I chew on my tongue"  . COPD (chronic obstructive pulmonary disease) ([place])   . Diabetes mellitus without complication ([place])    TYPE 2  . Family history of adverse reaction to anesthesia    BROTHER PONV, had a blood clot several days later and heart stopped  . Fatty liver    NONALCOLIC  . Fibroid   . Heart murmur   . Hypertension   . Infection of right eye    current dx on Saturday 7/15 - using drops  . Leg swelling    bilateral  . Migraine 1982-2009  . MVP (mitral valve prolapse)   . Neuromuscular disorder ([place])    right leg nerve pain  . Shortness of breath    WITH EXERTION  . Sinus pause    a. noted on telemetry during admission from 10/2016, lasting up to 4.4 seconds. BB discontinued.   . Sleep apnea 02/2016   CPAP 16 TO 20  . Stroke [facility]) noted on CAT 02/2014   "light", LLE weakness remains (03/30/2014)  . 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 Tingling in extremities 12/12/2010   Uric acid and electrolytes (02/23) normal but WBC  elevated.   D/Dx: carpal tunnel, ulnar neuropathy Less likely: cervical radiculopathy, vasculitis     Past Surgical History:  Procedure Laterality Date  . [place]  . CHOLECYSTECTOMY N/A 06/17/2017   Procedure: LAPAROSCOPIC CHOLECYSTECTOMY WITH INTRAOPERATIVE CHOLANGIOGRAM;  Surgeon: Donnie Mesa, MD;  Location: [facility];  Service: General;  Laterality: N/A;  . COLONOSCOPY WITH PROPOFOL N/A 04/28/2017   Procedure: COLONOSCOPY WITH PROPOFOL;  Surgeon: Manus Gunning, MD;  Location: [facility];  Service: Gastroenterology;  Laterality: N/A;  . DILATATION & CURETTAGE/HYSTEROSCOPY WITH MYOSURE N/A 05/06/2016   Procedure: DILATATION & CURETTAGE/HYSTEROSCOPY WITH MYOSURE;  Surgeon: Terrance Mass, MD;  Location: [facility];  Service: Gynecology;  Laterality: N/A;  request to  follow around 8:45  requests one hour OR time  . DILATATION & CURETTAGE/HYSTEROSCOPY WITH MYOSURE N/A 04/03/2017   Procedure: DILATATION & CURETTAGE/HYSTEROSCOPY WITH MYOSURE;  Surgeon: Terrance Mass, MD;  Location: [facility];  Service: Gynecology;  Laterality: N/A;  . DILATION AND CURETTAGE OF UTERUS    . ESOPHAGOGASTRODUODENOSCOPY (EGD) WITH PROPOFOL N/A 04/28/2017   Procedure: ESOPHAGOGASTRODUODENOSCOPY (EGD) WITH PROPOFOL;  Surgeon: Manus Gunning, MD;  Location: [facility];  Service: Gastroenterology;  Laterality: N/A;  . FOOT SURGERY Bilateral    "took bones out; put pins in"  . IR ABLATE LIVER CRYOABLATION  02/08/2020  . IR RADIOLOGIST EVAL & MGMT  02/03/2020  . KNEE ARTHROSCOPY Left 08/16/2018   Procedure: ARTHROSCOPY KNEE PARTIAL MEDIAL MENISECTOMY, CHONDROPLASTY MEDIAL AND LATERAL, PLICA RELEASE MEDIAL;  Surgeon: Dorna Leitz, MD;  Location: [facility];  Service: Orthopedics;  Laterality: Left;  . LEFT AND RIGHT HEART CATHETERIZATION WITH CORONARY ANGIOGRAM N/A 03/31/2014   Procedure: LEFT AND RIGHT HEART CATHETERIZATION WITH CORONARY ANGIOGRAM;  Surgeon: Birdie Riddle, MD;  Location: [facility];  Service: Cardiovascular;  Laterality: N/A;  . MYOMECTOMY  YRS AGO  . sonohystogram  04/14/2016   [facility]: intramural fibroid, premenopausal endometrium, right fluid filled tubular structure     Medications: Current Meds  Medication Sig  . acetaminophen (TYLENOL) 325 MG tablet Take 2 tablets (650 mg total) by mouth every 6 (six) hours as needed.  Marland Kitchen albuterol (VENTOLIN HFA) 108 (90 Base) MCG/ACT inhaler Inhale 1-2 puffs into the lungs every 6 (six) hours as needed for wheezing or shortness of breath.  . allopurinol (ZYLOPRIM) 300 MG tablet TAKE 1 TABLET BY MOUTH EVERY DAY  . amoxicillin-clavulanate (AUGMENTIN) 875-125 MG tablet Take 1 tablet by mouth 2 (two) times daily.  Marland Kitchen aspirin EC 81 MG tablet Take 81 mg by mouth daily.  Marland Kitchen atorvastatin (LIPITOR) 40 MG  tablet Take 40 mg by mouth once a day  . BREO ELLIPTA 200-25 MCG/INH AEPB TAKE 1 PUFF BY MOUTH EVERY DAY  . empagliflozin (JARDIANCE) 25 MG TABS tablet Take 1 tablet (25 mg total) by mouth daily.  . fluticasone (FLONASE) 50 MCG/ACT nasal spray Place 2 sprays into both nostrils daily.  Marland Kitchen glucose blood (ACCU-CHEK AVIVA) test strip Use to check sugar up to 3 times daily  . Insulin Pen Needle (PEN NEEDLES) 32G X 4 MM MISC 1 each by Does not apply route 3 (three) times daily.  Marland Kitchen KLOR-CON M20 20 MEQ tablet TAKE 1 TABLET BY MOUTH EVERY DAY  . Lancets (ACCU-CHEK SOFT TOUCH) lancets Use as instructed  . lidocaine (XYLOCAINE) 2 % solution Use as directed 15 mLs in the mouth or throat as needed for mouth pain.  Marland Kitchen liraglutide (VICTOZA) 18 MG/3ML SOPN Inject 1.2IN minus 2 clicks  under the skin once weekly.  Marland Kitchen lisinopril (ZESTRIL) 40 MG tablet Take 1 tablet (40 mg total) by mouth daily.  . metFORMIN (GLUCOPHAGE) 1000 MG tablet Take 1 tablet (1,000 mg total) by mouth 2 (two) times daily with a meal.  . pindolol (VISKEN) 10 MG tablet Take 1 tablet (10 mg total) by mouth 2 (two) times daily.  . predniSONE (DELTASONE) 20 MG tablet Take 2 tablets (40 mg total) by mouth daily with breakfast.  . tiotropium (SPIRIVA) 18 MCG inhalation capsule Place 1 capsule (18 mcg total) into inhaler and inhale daily.  Marland Kitchen torsemide (DEMADEX) 20 MG tablet Take 1 tablet by mouth daily, you may take 1 extra tablet daily by mouth only as needed  . traMADol (ULTRAM) 50 MG tablet Take 1 tablet (50 mg total) by mouth every 8 (eight) hours as needed for moderate pain. Maximum 6 tabs per day.  . triamcinolone (KENALOG) 0.025 % ointment APPLY 1 APPLICATION TOPICALLY 2 (TWO) TIMES DAILY AS NEEDED.  . [DISCONTINUED] hydrALAZINE (APRESOLINE) 25 MG tablet Take 1 tablet (25 mg total) by mouth 3 (three) times daily.  . [DISCONTINUED] hydrALAZINE (APRESOLINE) 25 MG tablet Take 2 tablets (50 mg total) by mouth in the morning and at bedtime.      Allergies: Allergies  Allergen Reactions  . Bupropion Other (See Comments)    Throat soreness and difficulty swallowing.   . Norvasc [Amlodipine]     Swelling    Social History: The patient  reports that she has been smoking cigarettes. She started smoking about 43 years ago. She has a 42.00 pack-year smoking history. She has never used smokeless tobacco. She reports current alcohol use. She reports that she does not use drugs.   Family History: The patient's family history includes COPD in her mother and sister; Cancer in her sister; Hypertension in her father, mother, sister, and sister.   Review of Systems: Please see the history of present illness.   All other systems are reviewed and negative.   Physical Exam: VS:  BP (!) 144/98   Pulse 93   Ht 5' 3"  (1.6 m)   Wt 274 lb 12.8 oz (124.6 kg)   LMP 04/06/2017   SpO2 95%   BMI 48.68 kg/m  .  BMI Body mass index is 48.68 kg/m.  Wt Readings from Last 3 Encounters:  05/01/20 274 lb 12.8 oz (124.6 kg)  04/09/20 278 lb 12.8 oz (126.5 kg)  03/24/20 280 lb (127 kg)    General: Pleasant. Alert and in no acute distress.   Cardiac: Regular rate and rhythm. No murmurs, rubs, or gallops. No edema.  Respiratory:  Lungs are clear to auscultation bilaterally with normal work of breathing.  GI: Soft and nontender.  MS: No deformity or atrophy. Gait and ROM intact.  Skin: Warm and dry. Color is normal.  Neuro:  Strength and sensation are intact and no gross focal deficits noted.  Psych: Alert, appropriate and with normal affect.   LABORATORY DATA:  EKG:  EKG is not ordered today.    Lab Results  Component Value Date   WBC 13.5 (H) 07/28/2019   HGB 16.4 (H) 07/28/2019   HCT 50.4 (H) 07/28/2019   PLT 265 07/28/2019   GLUCOSE 87 02/17/2020   CHOL 137 04/09/2020   TRIG 155 (H) 04/09/2020   HDL 42 04/09/2020   LDLCALC 68 04/09/2020   ALT 21 04/09/2020   AST 15 04/09/2020   NA 140 02/17/2020   K 4.2 02/17/2020   CL  102 02/17/2020   CREATININE 1.20 (H) 04/17/2020   BUN 12 02/17/2020   CO2 22 02/17/2020   TSH 3.260 04/11/2019   INR 1.00 06/07/2014   HGBA1C 6.3 02/17/2020   MICROALBUR 30 02/17/2020     BNP (last 3 results) No results for input(s): BNP in the last 8760 hours.  ProBNP (last 3 results) No results for input(s): PROBNP in the last 8760 hours.   Other Studies Reviewed Today:  Echo 10/23/16 Study Conclusions   - Left ventricle: The cavity size is small. Wall thickness was  increased in a pattern of severe LVH. Systolic function was  vigorous. The estimated ejection fraction was 75%. Wall motion  was normal; there were no regional wall motion abnormalities.  Doppler parameters are consistent with abnormal left ventricular  relaxation (grade 1 diastolic dysfunction). The E/e&' ratio is  >15, suggesting elevated LV filling pressure.  - Left atrium: The atrium was normal in size.  - Inferior vena cava: The vessel was normal in size. The  respirophasic diameter changes were in the normal range (>= 50%),  consistent with normal central venous pressure.   Impressions:   - [place] to a prior study in 2015, the LVEF is higher at 75%.  There is now severe LV wall thickening, diastolic dysfunction and  elevated LV filling pressure.  ASSESSMENT AND PLAN:  1.HTN - BP has improved (was 160/110 at last visit) - she is only taking Hydralazine BID - too hard to remember the mid day dose - will increase this to 50 mg BID.   2. Chronic diastolic HF - has LVH - needs good BP control and we have had improvement.   3. Tobacco abuse - encouraged her to stop. This sinus infection has made her smoke less.   4. LVH - see above.   5. DM - per PCP  6. HLD - on statin  Current medicines are reviewed with the patient today.  The patient does not have concerns regarding medicines other than what has been noted above.  The following changes have been made:  See  above.  Labs/ tests ordered today include:   No orders of the defined types were placed in this encounter.    Disposition:   FU with [place] in about 4 to 6 weeks - Hydralazine is increased today to 50 mg BID.    Patient is agreeable to this plan and will call if any problems develop in the interim.   SignedTruitt Merle, NP  05/01/2020 2:32 PM  [facility] [address] [place], [place]  [id number] Phone: [phone] Fax: [phone]

## 2020-04-17 NOTE — Chronic Care Management (AMB) (Signed)
Care Management   Follow Up Note   04/17/2020 Name: Hailey Barnes MRN: [id number] DOB: Apr 20, 1961  Referred by: [name], DO Reason for referral : Care Coordination (Care Management RNCM HTN/CHF)   Hailey Barnes is a 59 y.o. year old female who is a primary care patient of [name], DO. The care management team was consulted for assistance with care management and care coordination needs.    Review of patient status, including review of consultants reports, relevant laboratory and other test results, and collaboration with appropriate care team members and the patient's provider was performed as part of comprehensive patient evaluation and provision of chronic care management services.    SDOH (Social Determinants of Health) assessments performed: No See Care Plan activities for detailed interventions related to [place])     Advanced Directives: See Care Plan and Vynca application for related entries.   Goals Addressed              This Visit's Progress   .  I dont understand why my BP is High (pt-stated)        CARE PLAN ENTRY (see longtitudinal plan of care for additional care plan information)  Objective:  . Last practice recorded BP readings:  BP Readings from Last 3 Encounters:  03/24/20 (!) 171/101  03/05/20 (!) 150/110  02/17/20 (!) 156/110     Current Barriers:  Marland Kitchen Knowledge Deficits related to basic understanding of hypertension pathophysiology and self care management  Case Manager Clinical Goal(s):  Marland Kitchen Over the next 90 days, patient will demonstrate improved adherence to prescribed treatment plan for hypertension as evidenced by taking all medications as prescribed, monitoring and recording blood pressure as directed, adhering to low sodium/DASH diet  Interventions:  . Evaluation of current treatment plan related to hypertension self management and patient's adherence to plan as established by provider. . Provided education to patient  re: stroke prevention, s/s of heart attack and stroke, DASH diet, complications of uncontrolled blood pressure . Reviewed medications with patient and discussed importance of compliance . Discussed plans with patient for ongoing care management follow up and provided patient with direct contact information for care management team . Advised patient, providing education and rationale, to monitor blood pressure daily and record, calling PCP for findings outside established parameters.  . Advised patient to call insurance company to see if her plan has monthly allowance to provide her wwith a new BP monitor she can order . Sending the patient educational material "a matter of choices Blood Pressure control, calendar,Relaxation techniques for stress, How to read labels for low sodium, and exercise activity booklet . Discussed diet and drinking H2o . Discussed stress, and smoking.  Also discussed about exercise . 04/16/20 . Patient received educational material and found it very interesting . The patient called her insurance company and had 90 dollars saved on her card and was able to order her BP monitor . Patient states that she had a visit with cardiology on 6/21 and her medication was increased and she takes it tid. After a review of medication there was a discrepancy RNCM called the office and asked for clarification. Danielle from the office called and clarified the dosage and verified that she would call the patient. . The patient stated that she has an appointment with ENT 04/17/20 due to having a fish bone stuck in her throat and needs to have it removed.  Patient Self Care Activities:  . UNABLE to independently self manage elevated BP . Self  administers medications as prescribed . Attends all scheduled provider appointments . Calls provider office for new concerns, questions, or BP outside discussed parameters  Initial goal documentation          The care management team will reach out  to the patient again over the next 14 days.  The patient has been provided with contact information for the care management team and has been advised to call with any health related questions or concerns.   Lazaro Arms RN, BSN, [facility] Care Management Coordinator [place] Phone: [phone] Fax: [phone]

## 2020-04-26 ENCOUNTER — Ambulatory Visit (INDEPENDENT_AMBULATORY_CARE_PROVIDER_SITE_OTHER): Payer: Medicare HMO | Admitting: Family Medicine

## 2020-04-26 ENCOUNTER — Other Ambulatory Visit: Payer: Self-pay

## 2020-04-26 VITALS — BP 138/90 | HR 88

## 2020-04-26 DIAGNOSIS — J449 Chronic obstructive pulmonary disease, unspecified: Secondary | ICD-10-CM | POA: Diagnosis not present

## 2020-04-26 MED ORDER — PREDNISONE 20 MG PO TABS
40.0000 mg | ORAL_TABLET | Freq: Every day | ORAL | 0 refills | Status: DC
Start: 2020-04-26 — End: 2020-06-18

## 2020-04-26 MED ORDER — AMOXICILLIN-POT CLAVULANATE 875-125 MG PO TABS: 1.0000 | ORAL_TABLET | Freq: Two times a day (BID) | ORAL | 0 refills | Status: DC

## 2020-04-26 MED ORDER — FLUTICASONE PROPIONATE 50 MCG/ACT NA SUSP
2.0000 | Freq: Every day | NASAL | 6 refills | Status: DC
Start: 2020-04-26 — End: 2020-07-26

## 2020-04-26 NOTE — Progress Notes (Signed)
    SUBJECTIVE:   CHIEF COMPLAINT / HPI:   Cough, congestion.  This is a pleasant patient who reports to clinic today with concerns for sinus pressure, sneezing, and bilateral nasal congestion that started Saturday (5 days prior).  On Saturday she also had nonproductive cough.  Her friend gave her some Zyrtec and she felt better, reports it helped to "open her up a little bit".  Patient reports that on Sunday she had swelling of her nose, puffy eyes, and cough productive for yellow mucus.  She did not experience drainage from her nasal passages into her throat and chest.  Also reports chills, body aches, raspy breathing, nausea and diarrhea.  Denies any vomiting.  Also denies significant sore throat.  The patient reports that she also developed some shortness of breath.  This is not particularly unusual for her because she has COPD (well controlled with Breo and Spiriva, as well as occasional albuterol as needed).  She reports she has been controlling her symptoms at home as best that she can with Allegra.  She reports that Allegra is helpful but has not made her completely better.  She is concerned she is having a COPD exacerbation.  PERTINENT  PMH / PSH:  OSA COPD  OBJECTIVE:   BP 138/90   Pulse 88   LMP 04/06/2017   SpO2 96%    Physical exam: General: Pleasant patient, nontoxic-appearing Respiratory: Speaking in complete sentences, occasional cough, wheezing appreciated on physical exam, otherwise patient moving air very well Cardio: RRR, S1-S2 present, no murmurs appreciated Abdomen: Soft, nontender, normal bowel sounds appreciated   ASSESSMENT/PLAN:   COPD (chronic obstructive pulmonary disease) ([place]) Patient given the following instructions: 1. Keep taking Allegra.  2. Spray Flonase nasal spray into each nostril once daily.  3. Take Augmentin (antibiotic) twice daily x5 days. 4. Take Prednisone 19m once daily x 10.   5. Tylenol 5027m(1 tablet) every 8 hours for aches,  pains.  6. If no improvement check in with [place]orean Monday. If worse over the weekend seek medical attention.     [place]

## 2020-04-26 NOTE — Patient Instructions (Addendum)
Thank you for coming in to see [place] today! Please see below to review our plan for today's visit:  1. Keep taking Allegra.  2. Spray Flonase nasal spray into each nostril once daily.  3. Take Augmentin (antibiotic) twice daily x5 days. 4. Take Prednisone 63m once daily x 10.   5. Tylenol 5038m(1 tablet) every 8 hours for aches, pains.  6. If no improvement check in with us[place]orean Monday. If worse over the weekend seek medical attention.  Please call the clinic at [phone] your symptoms worsen or you have any concerns. It was our pleasure to serve you!   Dr. [facility]Milus [facility]

## 2020-04-26 NOTE — Assessment & Plan Note (Signed)
Patient given the following instructions: 1. Keep taking Allegra.  2. Spray Flonase nasal spray into each nostril once daily.  3. Take Augmentin (antibiotic) twice daily x5 days. 4. Take Prednisone 1m once daily x 10.   5. Tylenol 5053m(1 tablet) every 8 hours for aches, pains.  6. If no improvement check in with us[place]orean Monday. If worse over the weekend seek medical attention.

## 2020-05-01 ENCOUNTER — Encounter: Payer: Self-pay | Admitting: Nurse Practitioner

## 2020-05-01 ENCOUNTER — Other Ambulatory Visit: Payer: Self-pay

## 2020-05-01 ENCOUNTER — Telehealth: Payer: Self-pay | Admitting: Nurse Practitioner

## 2020-05-01 ENCOUNTER — Ambulatory Visit (INDEPENDENT_AMBULATORY_CARE_PROVIDER_SITE_OTHER): Payer: Medicare HMO | Admitting: Nurse Practitioner

## 2020-05-01 VITALS — BP 144/98 | HR 93 | Ht 63.0 in | Wt 274.8 lb

## 2020-05-01 DIAGNOSIS — I10 Essential (primary) hypertension: Secondary | ICD-10-CM

## 2020-05-01 DIAGNOSIS — I5032 Chronic diastolic (congestive) heart failure: Secondary | ICD-10-CM

## 2020-05-01 DIAGNOSIS — E785 Hyperlipidemia, unspecified: Secondary | ICD-10-CM | POA: Diagnosis not present

## 2020-05-01 MED ORDER — HYDRALAZINE HCL 25 MG PO TABS: 50.0000 mg | ORAL_TABLET | Freq: Two times a day (BID) | ORAL | 3 refills | Status: DC

## 2020-05-01 MED ORDER — HYDRALAZINE HCL 50 MG PO TABS
50.0000 mg | ORAL_TABLET | Freq: Two times a day (BID) | ORAL | 3 refills | Status: DC
Start: 2020-05-01 — End: 2020-07-26

## 2020-05-01 NOTE — Telephone Encounter (Signed)
Patient calling stating her 57 year old granddaughter is with her and she would like to know if she can bring her to her appointment today at 2:15 pm.

## 2020-05-01 NOTE — Telephone Encounter (Signed)
If pt has no other options and granddaughter has to wear mask. Pt stated ok.

## 2020-05-01 NOTE — Patient Instructions (Addendum)
After Visit Summary:  We will be checking the following labs today - NONE   Medication Instructions:    Continue with your current medicines. BUT  I am going to increase the Hydralazine to 50 mg twice a day -    If you need a refill on your cardiac medications before your next appointment, please call your pharmacy.     Testing/Procedures To Be Arranged:  N/A  Follow-Up:   See [place] back in about 4 to 6 weeks.     At [facility], you and your health needs are our priority.  As part of our continuing mission to provide you with exceptional heart care, we have created designated Provider Care Teams.  These Care Teams include your primary Cardiologist (physician) and Advanced Practice Providers (APPs -  Physician Assistants and Nurse Practitioners) who all work together to provide you with the care you need, when you need it.  Special Instructions:  . Stay safe, wash your hands for at least 20 seconds and wear a mask when needed.  . It was good to talk with you today.    Call the [place] office at [phone] if you have any questions, problems or concerns.

## 2020-05-02 ENCOUNTER — Ambulatory Visit: Payer: Medicare HMO

## 2020-05-02 NOTE — Chronic Care Management (AMB) (Signed)
  Care Management   Outreach Note  05/02/2020 Name: Hailey Barnes MRN: [id number] DOB: 03-Jan-1961  Referred by: Lurline Del, DO Reason for referral : Chronic Care Management (HTN)   RNCM spoke with the  patient but she was unable to talk at the time and asked if I could call her at a later date.   Follow Up Plan: RNCM will call the patient within the month of July   [name] RN, BSN, [facility] Care Management Coordinator [place] Phone: [phone] Fax: [phone]

## 2020-05-03 ENCOUNTER — Encounter: Payer: Self-pay | Admitting: Family Medicine

## 2020-05-07 ENCOUNTER — Ambulatory Visit (INDEPENDENT_AMBULATORY_CARE_PROVIDER_SITE_OTHER): Payer: Medicare HMO | Admitting: Family Medicine

## 2020-05-07 ENCOUNTER — Other Ambulatory Visit: Payer: Self-pay

## 2020-05-07 VITALS — BP 142/92 | HR 81 | Wt 281.4 lb

## 2020-05-07 DIAGNOSIS — F329 Major depressive disorder, single episode, unspecified: Secondary | ICD-10-CM | POA: Diagnosis not present

## 2020-05-07 DIAGNOSIS — M25561 Pain in right knee: Secondary | ICD-10-CM | POA: Insufficient documentation

## 2020-05-07 DIAGNOSIS — G8929 Other chronic pain: Secondary | ICD-10-CM | POA: Diagnosis not present

## 2020-05-07 DIAGNOSIS — F32A Depression, unspecified: Secondary | ICD-10-CM

## 2020-05-07 DIAGNOSIS — M1711 Unilateral primary osteoarthritis, right knee: Secondary | ICD-10-CM

## 2020-05-07 MED ORDER — CAPSAICIN 0.035 % EX CREA: 1.0000 [oz_av] | TOPICAL_CREAM | Freq: Three times a day (TID) | CUTANEOUS | 1 refills | Status: DC

## 2020-05-07 NOTE — Progress Notes (Signed)
SUBJECTIVE:   CHIEF COMPLAINT / HPI:   Right knee pain Per documentation in April of patient's previous procedure if successful that procedure can reduce pain for approximately 3 months but thereafter the pain will return.  Procedure was performed on 4/22.  And per documentation appears preprocedural pain was 10 out of 10 with postprocedural pain 0 out of 10.  It appears the lateral femoral cutaneous nerve was not identified and not treated during that procedure and there is documentation that patient can follow-up if you continue to have lateral sided knee pain for the nerve to be ablated.  Patient states that she initially had benefit from this procedure with pain resolution of the medial aspect of the right knee and some improvement pain on the lateral aspect of the right knee but this only lasted for a few days.  Patient states over the past few weeks she has gotten tingling painful sensation on both sides of the knees which she thinks may be from the nerves "coming back" after the procedure.  Patient also endorses some pain deep in the knee which is chronic as well as some tightness to the hamstrings.  Patient states she would not want to have this procedure again but that her BMI is "too high" per surgery for a knee replacement. Patient with fine touch pain to knee as well as chronic osteoarthritis pain. Has tried voltaren.   PERTINENT  PMH / PSH: Chronic osteoarthritis of right and left knee  OBJECTIVE:   BP (!) 142/92   Pulse 81   Wt 281 lb 6.4 oz (127.6 kg)   LMP 04/06/2017   SpO2 97%   BMI 49.85 kg/m      Office Visit from 05/07/2020 in [place]  PHQ-9 Total Score 16      General: NAD, pleasant, able to participate in exam Cardiac: RRR, no murmurs. Respiratory: CTAB MSK: Pain with light touch sensation on both the medial and lateral aspects of the right knee consistent with nerve pain.  Pain in the hamstring with knee extension suggestive of tight  hamstrings.  Some pain deep in the knee with mobility of the knee suggestive of osteoarthritis.  Negative anterior drawer sign.  No erythema or swelling of the knee. Psych: Normal affect and mood  ASSESSMENT/PLAN:   Right knee pain Assessment: Chronic osteoarthritis of the right knee.  Patient is status post procedure to ablate the nerves of this approximately 3 months ago with the plan per documentation to improve patient's pain for about 3 months.  Patient is starting to feel some nerve pain as expected at this point which is expected.  Patient states she plans to eventually have the knee replaced but was told she needed to reduce her BMI before this. Plan: -We will provide referral for physical therapy to help patient with stretches of the quadriceps and hamstrings as these seem tight on exam -We will provide cessation cream for the nerve pain/superficial pain -We will provide referral for weight management as the patient states the surgeon told her she needed to reduce her BMI before being considered for a knee replacement or arthroscopic surgery. -Recommend follow-up in 3-4 weeks at which point can consider cortisone injection.  Depression Assessment: Depression with passive suicidal ideation.  PHQ 9 score of 15 with a 2 marked on question 9.  Patient without any current plans for suicide.  States that these are just passive thoughts due to her chronic right knee pain. Plan: -Precepted patient  with Dr. Oren Binet due to patient's answer positive on question #9 -Discussed with patient considering therapy versus medication treatment -Patient plans to follow-up in 2 weeks -Provided suicide hotline resources   -If blood pressure elevated at next appointment we will consider adjustment of blood pressure medications.  Lurline Del, [place]

## 2020-05-07 NOTE — Patient Instructions (Signed)
It was great to see you!  Our plans for today:  -Today we discussed your right knee pain. I am prescribing capsaicin cream to use on this 3 times per day. Apply a light film to your right knee for pain. I am also providing a referral for weight management to assist you in losing weight with the plan to eventually have surgery on your knee. -For your depression and passive suicidal thoughts I would like for you to follow-up in the next 2 weeks to further discuss options for these. As we discussed in person I am providing resources below in case your suicidal thoughts return. Should this occur I would like for you to call the resources below and set a follow-up appointment with [place] as well sooner than planned. At her next appointment we can discuss medications and counseling in more detail if you would prefer.   Take care and seek immediate care sooner if you develop any concerns.   Dr. [facility]   If you are feeling suicidal or depression symptoms worsen please immediately go to:   [facility]  [address] [place], [place] [phone] Crisis [phone]     If you are thinking about harming yourself or having thoughts of suicide, or if you know someone who is, seek help right away.  Call your doctor or mental health care provider.  Call 911 or go to a hospital emergency room to get immediate help, or ask a friend or family member to help you do these things.  Call the [place] National Suicide Prevention Lifelines toll-free, 24-hour hotline at 1-800-273-TALK 8704329038) or TTY: 204 118 3552 TTY (864)412-8911) to talk to a trained counselor.  If you are in crisis, make sure you are not left alone.   If someone else is in crisis, make sure he or she is not left alone   [facility] [employer] (Domestic Violence, Rape & Victim Assistance [phone]  [facility] -  [facility]  [address], [place]  [address]               [phone] or [phone]  [place]    (ONLY from 8am-4pm)    [phone]  [facility] Mobile [facility] (24/7)   [phone]  [place] National Suicide Hotline   [phone] Diamantina Monks)

## 2020-05-07 NOTE — Assessment & Plan Note (Signed)
Assessment: Chronic osteoarthritis of the right knee.  Patient is status post procedure to ablate the nerves of this approximately 3 months ago with the plan per documentation to improve patient's pain for about 3 months.  Patient is starting to feel some nerve pain as expected at this point which is expected.  Patient states she plans to eventually have the knee replaced but was told she needed to reduce her BMI before this. Plan: -We will provide referral for physical therapy to help patient with stretches of the quadriceps and hamstrings as these seem tight on exam -We will provide cessation cream for the nerve pain/superficial pain -We will provide referral for weight management as the patient states the surgeon told her she needed to reduce her BMI before being considered for a knee replacement or arthroscopic surgery. -Recommend follow-up in 3-4 weeks at which point can consider cortisone injection.

## 2020-05-07 NOTE — Assessment & Plan Note (Signed)
Assessment: Depression with passive suicidal ideation.  PHQ 9 score of 15 with a 2 marked on question 9.  Patient without any current plans for suicide.  States that these are just passive thoughts due to her chronic right knee pain. Plan: -Precepted patient with Dr. Oren Binet due to patient's answer positive on question #9 -Discussed with patient considering therapy versus medication treatment -Patient plans to follow-up in 2 weeks -Provided suicide hotline resources

## 2020-05-16 ENCOUNTER — Other Ambulatory Visit: Payer: Self-pay

## 2020-05-16 DIAGNOSIS — I10 Essential (primary) hypertension: Secondary | ICD-10-CM

## 2020-05-16 MED ORDER — ATORVASTATIN CALCIUM 40 MG PO TABS: ORAL_TABLET | ORAL | 3 refills | Status: DC

## 2020-05-22 ENCOUNTER — Ambulatory Visit: Payer: Medicare HMO | Attending: Family Medicine | Admitting: Physical Therapy

## 2020-06-07 DIAGNOSIS — G4733 Obstructive sleep apnea (adult) (pediatric): Secondary | ICD-10-CM | POA: Diagnosis not present

## 2020-06-18 ENCOUNTER — Encounter: Payer: Self-pay | Admitting: Cardiology

## 2020-06-18 ENCOUNTER — Ambulatory Visit (INDEPENDENT_AMBULATORY_CARE_PROVIDER_SITE_OTHER): Payer: Medicare HMO | Admitting: Cardiology

## 2020-06-18 ENCOUNTER — Other Ambulatory Visit: Payer: Self-pay

## 2020-06-18 VITALS — BP 140/90 | HR 86 | Ht 63.0 in | Wt 277.0 lb

## 2020-06-18 DIAGNOSIS — R0789 Other chest pain: Secondary | ICD-10-CM

## 2020-06-18 DIAGNOSIS — I10 Essential (primary) hypertension: Secondary | ICD-10-CM

## 2020-06-18 NOTE — Progress Notes (Signed)
Cardiology Office Note:    Date:  06/18/2020   ID:  Hailey Barnes [birth year], MRN [id number]  PCP:  Lurline Del, DO  [facility] Cardiologist:  Candee Furbish, MD  [facility] Electrophysiologist:  None   Referring MD: Lurline Del, DO    History of Present Illness:    Hailey Barnes is a 59 y.o. female here for follow-up of sinus bradycardia pause at night of 4.4 seconds.  Beta-blocker was discontinued at that time.  Widowed.  Has had lower extremity edema in the past.  Back issues.  Has severe LVH on prior echo  Past Medical History:  Diagnosis Date  . Arthritis    "knees" (02/21/2014), back  . CHF (congestive heart failure) ([place])   . Chronic bronchitis ([place])    "get it q yr" (02/21/2014)  . Chronic diastolic CHF (congestive heart failure) ([place])    a. Echo 10/23/2016: EF 75 %  . Chronic lower back pain   . Complication of anesthesia    "I don't come out well; I chew on my tongue"  . COPD (chronic obstructive pulmonary disease) ([place])   . Diabetes mellitus without complication ([place])    TYPE 2  . Family history of adverse reaction to anesthesia    BROTHER PONV, had a blood clot several days later and heart stopped  . Fatty liver    NONALCOLIC  . Fibroid   . Heart murmur   . Hypertension   . Infection of right eye    current dx on Saturday 7/15 - using drops  . Leg swelling    bilateral  . Migraine 1982-2009  . MVP (mitral valve prolapse)   . Neuromuscular disorder ([place])    right leg nerve pain  . Shortness of breath    WITH EXERTION  . Sinus pause    a. noted on telemetry during admission from 10/2016, lasting up to 4.4 seconds. BB discontinued.   . Sleep apnea 02/2016   CPAP 16 TO 20  . Stroke [facility]) noted on CAT 02/2014   "light", LLE weakness remains (03/30/2014)  . 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 Tingling in extremities 12/12/2010   Uric acid and electrolytes (02/23) normal  but WBC elevated.   D/Dx: carpal tunnel, ulnar neuropathy Less likely: cervical radiculopathy, vasculitis     Past Surgical History:  Procedure Laterality Date  . [place]  . CHOLECYSTECTOMY N/A 06/17/2017   Procedure: LAPAROSCOPIC CHOLECYSTECTOMY WITH INTRAOPERATIVE CHOLANGIOGRAM;  Surgeon: Donnie Mesa, MD;  Location: [facility];  Service: General;  Laterality: N/A;  . COLONOSCOPY WITH PROPOFOL N/A 04/28/2017   Procedure: COLONOSCOPY WITH PROPOFOL;  Surgeon: Manus Gunning, MD;  Location: [facility];  Service: Gastroenterology;  Laterality: N/A;  . DILATATION & CURETTAGE/HYSTEROSCOPY WITH MYOSURE N/A 05/06/2016   Procedure: DILATATION & CURETTAGE/HYSTEROSCOPY WITH MYOSURE;  Surgeon: Terrance Mass, MD;  Location: [facility];  Service: Gynecology;  Laterality: N/A;  request to follow around 8:45  requests one hour OR time  . DILATATION & CURETTAGE/HYSTEROSCOPY WITH MYOSURE N/A 04/03/2017   Procedure: DILATATION & CURETTAGE/HYSTEROSCOPY WITH MYOSURE;  Surgeon: Terrance Mass, MD;  Location: [facility];  Service: Gynecology;  Laterality: N/A;  . DILATION AND CURETTAGE OF UTERUS    . ESOPHAGOGASTRODUODENOSCOPY (EGD) WITH PROPOFOL N/A 04/28/2017   Procedure: ESOPHAGOGASTRODUODENOSCOPY (EGD) WITH PROPOFOL;  Surgeon: Manus Gunning, MD;  [facility];  Service: Gastroenterology;  Laterality: N/A;  .  FOOT SURGERY Bilateral    "took bones out; put pins in"  . IR ABLATE LIVER CRYOABLATION  02/08/2020  . IR RADIOLOGIST EVAL & MGMT  02/03/2020  . KNEE ARTHROSCOPY Left 08/16/2018   Procedure: ARTHROSCOPY KNEE PARTIAL MEDIAL MENISECTOMY, CHONDROPLASTY MEDIAL AND LATERAL, PLICA RELEASE MEDIAL;  Surgeon: Dorna Leitz, MD;  Location: [facility];  Service: Orthopedics;  Laterality: Left;  . LEFT AND RIGHT HEART CATHETERIZATION WITH CORONARY ANGIOGRAM N/A 03/31/2014   Procedure: LEFT AND RIGHT HEART CATHETERIZATION WITH CORONARY ANGIOGRAM;  Surgeon: Birdie Riddle, MD;  Location: [facility]  CATH LAB;  Service: Cardiovascular;  Laterality: N/A;  . MYOMECTOMY  YRS AGO  . sonohystogram  04/14/2016   [facility]: intramural fibroid, premenopausal endometrium, right fluid filled tubular structure    Current Medications: Current Meds  Medication Sig  . acetaminophen (TYLENOL) 325 MG tablet Take 2 tablets (650 mg total) by mouth every 6 (six) hours as needed.  [place] Kitchen albuterol (VENTOLIN HFA) 108 (90 Base) MCG/ACT inhaler Inhale 1-2 puffs into the lungs every 6 (six) hours as needed for wheezing or shortness of breath.  . allopurinol (ZYLOPRIM) 300 MG tablet TAKE 1 TABLET BY MOUTH EVERY DAY  . amoxicillin-clavulanate (AUGMENTIN) 875-125 MG tablet Take 1 tablet by mouth 2 (two) times daily.  Mar[place] Kitchen aspirin EC 81 MG tablet Take 81 mg by mouth daily.  Mar[place] Kitchen atorvastatin (LIPITOR) 40 MG tablet Take 40 mg by mouth once a day  . BREO ELLIPTA 200-25 MCG/INH AEPB TAKE 1 PUFF BY MOUTH EVERY DAY  . Capsaicin 0.035 % CREA Apply 1 oz topically 3 (three) times daily. Apply to right knee for pain  . empagliflozin (JARDIANCE) 25 MG TABS tablet Take 1 tablet (25 mg total) by mouth daily.  . fluticasone (FLONASE) 50 MCG/ACT nasal spray Place 2 sprays into both nostrils daily.  [place] Kitchen glucose blood (ACCU-CHEK AVIVA) test strip Use to check sugar up to 3 times daily  . hydrALAZINE (APRESOLINE) 50 MG tablet Take 1 tablet (50 mg total) by mouth in the morning and at bedtime.  Mar[place] Kitchen KLOR-CON M20 20 MEQ tablet TAKE 1 TABLET BY MOUTH EVERY DAY  . Lancets (ACCU-CHEK SOFT TOUCH) lancets Use as instructed  . lidocaine (XYLOCAINE) 2 % solution Use as directed 15 mLs in the mouth or throat as needed for mouth pain.  [place] Kitchen liraglutide (VICTOZA) 18 MG/3ML SOPN Inject 4.6TK minus 2 clicks under the skin once weekly.  Mar[place] Kitchen lisinopril (ZESTRIL) 40 MG tablet Take 1 tablet (40 mg total) by mouth daily.  . metFORMIN (GLUCOPHAGE) 1000 MG tablet Take 1 tablet (1,000 mg total) by mouth 2 (two) times daily with a meal.  . pindolol  (VISKEN) 10 MG tablet Take 1 tablet (10 mg total) by mouth 2 (two) times daily.  Mar[place] Kitchen tiotropium (SPIRIVA) 18 MCG inhalation capsule Place 1 capsule (18 mcg total) into inhaler and inhale daily.  Marland Kitchen torsemide (DEMADEX) 20 MG tablet Take 1 tablet by mouth daily, you may take 1 extra tablet daily by mouth only as needed  . traMADol (ULTRAM) 50 MG tablet Take 1 tablet (50 mg total) by mouth every 8 (eight) hours as needed for moderate pain. Maximum 6 tabs per day.  . triamcinolone (KENALOG) 0.025 % ointment APPLY 1 APPLICATION TOPICALLY 2 (TWO) TIMES DAILY AS NEEDED.     Allergies:   Bupropion and Norvasc [amlodipine]   Social History   Socioeconomic History  . Marital status: Divorced    Spouse name: Not on file  . Number of children:  1  . Years of education: 71  . Highest education level: 12th grade  Occupational History  . Occupation: Psychologist, educational    Comment: disabled  . Occupation: patient care assistance  Tobacco Use  . Smoking status: Current Every Day Smoker    Packs/day: 1.00    Years: 42.00    Pack years: 42.00    Types: Cigarettes    Start date: 10/20/1976  . Smokeless tobacco: Never Used  Vaping Use  . Vaping Use: Never used  Substance and Sexual Activity  . Alcohol use: Yes    Comment: very occasional  . Drug use: No    Types: "Crack" cocaine    Comment: 03/30/2014 "stopped using crack 02/21/2014"  . Sexual activity: Yes    Birth control/protection: Post-menopausal  Other Topics Concern  . Not on file  Social History Narrative   Divorced since 2015.   Lives alone. No pets. Daughter lives in [place]. Daughter helps with transportation. Talks daily on phone, sees once or twice a week. 2 grandkids.   Enjoys church at [employer], [place] geared towards people with substance abuse history.    Lives in duplex apartment, one level, three stairs to get in. Has handrails, has grab bars in bathroom. Smoke alarms.   Bakes most meals, avoids starches, rice. Eats  vegetables. Drinks tea, ginger ale.    Likes to go to movies, used to walk daily but not now due to knee pain. Likes to spend time with grandchildren, go out to eat. Spend time with family and circle of friends.    Social Determinants of Health   Financial Resource Strain:   . Difficulty of Paying Living Expenses: Not on file  Food Insecurity:   . Worried About Charity fundraiser in the Last Year: Not on file  . Ran Out of Food in the Last Year: Not on file  Transportation Needs:   . Lack of Transportation (Medical): Not on file  . Lack of Transportation (Non-Medical): Not on file  Physical Activity:   . Days of Exercise per Week: Not on file  . Minutes of Exercise per Session: Not on file  Stress:   . Feeling of Stress : Not on file  Social Connections:   . Frequency of Communication with Friends and Family: Not on file  . Frequency of Social Gatherings with Friends and Family: Not on file  . Attends Religious Services: Not on file  . Active Member of Clubs or Organizations: Not on file  . Attends Archivist Meetings: Not on file  . Marital Status: Not on file     Family History: The patient's family history includes COPD in her mother and sister; Cancer in her sister; Hypertension in her father, mother, sister, and sister.  ROS:   Please see the history of present illness.     All other systems reviewed and are negative.  EKGs/Labs/Other Studies Reviewed:    The following studies were reviewed today:  Echocardiogram 10/23/2016 -Severe LVH EF 75%, elevated left atrial filling pressures  Cardiac catheterization 03/31/2014: -No significant CAD, EF 70%, LVEDP 19   Recent Labs: 07/28/2019: Hemoglobin 16.4; Platelets 265 02/17/2020: BUN 12; Potassium 4.2; Sodium 140 04/09/2020: ALT 21 04/17/2020: Creatinine, Ser 1.20  Recent Lipid Panel    Component Value Date/Time   CHOL 137 04/09/2020 1119   TRIG 155 (H) 04/09/2020 1119   HDL 42 04/09/2020 1119   CHOLHDL 3.3  04/09/2020 1119   CHOLHDL 4.1 07/02/2017 1540  VLDL 23 07/02/2017 0652   LDLCALC 68 04/09/2020 1119    Physical Exam:    VS:  BP 140/90   Pulse 86   Ht 5' 3"  (1.6 m)   Wt 277 lb (125.6 kg)   LMP 04/06/2017   SpO2 96%   BMI 49.07 kg/m     Wt Readings from Last 3 Encounters:  06/18/20 277 lb (125.6 kg)  05/07/20 281 lb 6.4 oz (127.6 kg)  05/01/20 274 lb 12.8 oz (124.6 kg)     GEN:  Well nourished, well developed in no acute distress HEENT: Normal NECK: No JVD; No carotid bruits LYMPHATICS: No lymphadenopathy CARDIAC: RRR, no murmurs, rubs, gallops RESPIRATORY:  Clear to auscultation without rales, wheezing or rhonchi  ABDOMEN: Soft, non-tender, non-distended MUSCULOSKELETAL:  No edema; No deformity  SKIN: Warm and dry NEUROLOGIC:  Alert and oriented x 3 PSYCHIATRIC:  Normal affect   ASSESSMENT:    1. Hypertension, unspecified type   2. Burning chest pain    PLAN:    In order of problems listed above:  Essential hypertension -Continue to encourage medication.  Outside labs creatinine 1.2 hemoglobin 16.4 hemoglobin A1c 6.3 LDL 73 ALT 21  Tobacco use -Continue to encourage cessation  Severe LVH -Pressure control.  Doing better with hydralazine 50 twice daily.  Weight loss.  Diabetes with hyperlipidemia -Continue with statin therapy  GERD/burning chest pain --TUMS, can try Pepcid.  Remember, no CAD on heart cath in 2015.  Insomnia  can't sleep.  Continue to encourage sleep hygiene.  Wearing sleep mask.  OSA --CPAP  Medication Adjustments/Labs and Tests Ordered: Current medicines are reviewed at length with the patient today.  Concerns regarding medicines are outlined above.  No orders of the defined types were placed in this encounter.  No orders of the defined types were placed in this encounter.   Patient Instructions  Medication Instructions:  The current medical regimen is effective;  continue present plan and medications.  *If you need a  refill on your cardiac medications before your next appointment, please call your pharmacy*  Follow-Up: At [facility], you and your health needs are our priority.  As part of our continuing mission to provide you with exceptional heart care, we have created designated Provider Care Teams.  These Care Teams include your primary Cardiologist (physician) and Advanced Practice Providers (APPs -  Physician Assistants and Nurse Practitioners) who all work together to provide you with the care you need, when you need it.  We recommend signing up for the patient portal called "MyChart".  Sign up information is provided on this After Visit Summary.  MyChart is used to connect with patients for Virtual Visits (Telemedicine).  Patients are able to view lab/test results, encounter notes, upcoming appointments, etc.  Non-urgent messages can be sent to your provider as well.   To learn more about what you can do with MyChart, go to [email or web address].    Your next appointment:   6 month(s)  The format for your next appointment:   In Person  Provider:   Candee Furbish, MD   Thank you for choosing [facility]!!        Signed, Candee Furbish, MD  06/18/2020 3:19 PM    [place]

## 2020-06-18 NOTE — Patient Instructions (Signed)

## 2020-07-02 ENCOUNTER — Other Ambulatory Visit: Payer: Self-pay | Admitting: Family Medicine

## 2020-07-03 ENCOUNTER — Ambulatory Visit (INDEPENDENT_AMBULATORY_CARE_PROVIDER_SITE_OTHER): Payer: Medicare HMO | Admitting: Family Medicine

## 2020-07-03 ENCOUNTER — Telehealth: Payer: Self-pay | Admitting: *Deleted

## 2020-07-03 ENCOUNTER — Other Ambulatory Visit (HOSPITAL_COMMUNITY)
Admission: RE | Admit: 2020-07-03 | Discharge: 2020-07-03 | Disposition: A | Discharge status: Home / Self Care | Payer: Medicare HMO | Source: Ambulatory Visit | Attending: Family Medicine | Admitting: Family Medicine

## 2020-07-03 ENCOUNTER — Encounter: Payer: Self-pay | Admitting: Family Medicine

## 2020-07-03 ENCOUNTER — Other Ambulatory Visit: Payer: Self-pay

## 2020-07-03 VITALS — BP 160/92 | HR 84 | Wt 278.2 lb

## 2020-07-03 DIAGNOSIS — N898 Other specified noninflammatory disorders of vagina: Secondary | ICD-10-CM

## 2020-07-03 DIAGNOSIS — M5416 Radiculopathy, lumbar region: Secondary | ICD-10-CM

## 2020-07-03 LAB — POCT WET PREP (WET MOUNT)
Clue Cells Wet Prep Whiff POC: NEGATIVE
Trichomonas Wet Prep HPF POC: ABSENT

## 2020-07-03 NOTE — Telephone Encounter (Signed)
Orders placed, please contact [facility] for scheduling. 

## 2020-07-03 NOTE — Telephone Encounter (Signed)
LMOM with Angelita Ingles and pt also notified.

## 2020-07-03 NOTE — Telephone Encounter (Signed)
Pt would like an order for another epidural sent to [facility].

## 2020-07-03 NOTE — Patient Instructions (Signed)
It was nice to meet you today!  Waiting on results of swabs for infections  Please schedule in colposcopy clinic here at the [facility]. This clinic is on Thursday mornings  Try lubrication with intercourse to see if that helps.  Be well, Dr. Ardelia Mems

## 2020-07-03 NOTE — Progress Notes (Signed)
    SUBJECTIVE:   CHIEF COMPLAINT / HPI:   Presents for same day appointment for vaginal itching.  Vaginal itching Patient reports intermittent vaginal itching of weeks-long duration occurring predominantly after intercourse. She is in a monogamous relationship with her partner, with no condoms used. She endorses a longstanding history of dyspareunia. This new-onset itch is intravaginal rather than along the vulva. Medical hx is significant for T2DM [well controlled, A1C 6.3 in April 2021], for which she has been on Jardiance 25 mg for ~a year (also taking Victoza, Metformin 1000 mg bid). Denies dysuria, change to frequency of urination, hematuria, change to vaginal discharge, vaginal odor, vaginal dryness. Patient desires STI testing.   PERTINENT  PMH / PSH: T2DM, HTN, COPD, OSA, HLD, Severe Obesity  OBJECTIVE:   BP (!) 160/92   Pulse 84   Wt 278 lb 3.2 oz (126.2 kg)   LMP 04/06/2017   SpO2 97%   BMI 49.28 kg/m   General: well appearing, NAD. Pleasant and cooperative Pulmonary: Normal work of breathing Pelvic: Chaperone present. Moderately atrophic vulva and vaginal walls. Small, flat-appearing, solitary hyperpigmented lesion at [place] position on cervix, not wipeable with swab, no adnexal or cervical motion tenderness.  Neuro: Alert and responsive.  ASSESSMENT/PLAN:   Health maintenance -has received both doses of COVID vaccine  Vaginal itching Longstanding background complaint in patient (~1 year+), with superimposed acute condition. Suspect postmenopausal atrophy contributing. Question whether jardiance is contributing, though diabetes is well controlled so hesitant to make changes at this time.  -Wet prep today -GC/Chlamydia today -Advised to use water-based lubricant prior to intercourse to see if this resolves symptoms   Cervical lesion  Hyperpigmented lesion noted on exam today. Hx of normal pap smears, most recent in 2019. Advised scheduling in [facility] for  further assessment of lesion. May warrant biopsy.  [place]   Patient seen along with MS3 student Romeo Apple. I personally evaluated this patient along with the student, and verified all aspects of the history, physical exam, and medical decision making as documented by the student. I agree with the student's documentation and have made all necessary edits.  Chrisandra Netters, MD  [place]

## 2020-07-04 LAB — CERVICOVAGINAL ANCILLARY ONLY
Chlamydia: NEGATIVE
Comment: NEGATIVE
Comment: NORMAL
Neisseria Gonorrhea: NEGATIVE

## 2020-07-05 ENCOUNTER — Encounter: Payer: Self-pay | Admitting: Family Medicine

## 2020-07-05 DIAGNOSIS — N889 Noninflammatory disorder of cervix uteri, unspecified: Secondary | ICD-10-CM | POA: Insufficient documentation

## 2020-07-06 ENCOUNTER — Other Ambulatory Visit: Payer: Self-pay

## 2020-07-06 ENCOUNTER — Ambulatory Visit
Admission: RE | Admit: 2020-07-06 | Discharge: 2020-07-06 | Disposition: A | Discharge status: Home / Self Care | Payer: Medicare HMO | Source: Ambulatory Visit | Attending: Sports Medicine | Admitting: Sports Medicine

## 2020-07-06 DIAGNOSIS — M47817 Spondylosis without myelopathy or radiculopathy, lumbosacral region: Secondary | ICD-10-CM | POA: Diagnosis not present

## 2020-07-06 DIAGNOSIS — M5116 Intervertebral disc disorders with radiculopathy, lumbar region: Secondary | ICD-10-CM | POA: Diagnosis not present

## 2020-07-06 DIAGNOSIS — M5416 Radiculopathy, lumbar region: Secondary | ICD-10-CM

## 2020-07-06 MED ORDER — IOPAMIDOL (ISOVUE-M 200) INJECTION 41%
1.0000 mL | Freq: Once | INTRAMUSCULAR | Status: AC
  Administered 2020-07-06: 1 mL via EPIDURAL

## 2020-07-06 MED ORDER — METHYLPREDNISOLONE ACETATE 40 MG/ML INJ SUSP (RADIOLOG
120.0000 mg | Freq: Once | INTRAMUSCULAR | Status: AC
  Administered 2020-07-06: 120 mg via EPIDURAL

## 2020-07-06 NOTE — Discharge Instructions (Signed)

## 2020-07-10 ENCOUNTER — Ambulatory Visit (INDEPENDENT_AMBULATORY_CARE_PROVIDER_SITE_OTHER): Payer: Medicare HMO

## 2020-07-10 ENCOUNTER — Ambulatory Visit (INDEPENDENT_AMBULATORY_CARE_PROVIDER_SITE_OTHER): Payer: Medicare HMO | Admitting: Sports Medicine

## 2020-07-10 DIAGNOSIS — M180 Bilateral primary osteoarthritis of first carpometacarpal joints: Secondary | ICD-10-CM

## 2020-07-10 DIAGNOSIS — G5603 Carpal tunnel syndrome, bilateral upper limbs: Secondary | ICD-10-CM

## 2020-07-10 NOTE — Assessment & Plan Note (Signed)
Hailey Barnes has been having increasing pain in her left first [facility], this was last injected in June 2020. She knows she needs surgery, but is hoping to put this off, repeat injection today. She does have right-sided pain as well, since we are doing a left CMC injection and a right carpal tunnel hydrodissection we will hope for systemic effect, if not she can return and we can inject her right CMC.

## 2020-07-10 NOTE — Progress Notes (Signed)
    Procedures performed today:    Procedure: Real-time Ultrasound Guided hydrodissection of the right median nerve at the carpal tunnel Device: Samsung HS60 Verbal informed consent obtained.  Time-out conducted.  Noted no overlying erythema, induration, or other signs of local infection.  Skin prepped in a sterile fashion.  Local anesthesia: Topical Ethyl chloride.  With sterile technique and under real time ultrasound guidance: Using a 25-gauge needle advanced into the carpal tunnel, taking care to avoid intraneural injection I injected medication both superficial to and deep to the median nerve freeing it from surrounding structures, I then redirected the needle deep and injected further medication around the flexor tendons deep within the carpal tunnel for a total of 1 cc kenalog 40, 5 cc 1% lidocaine without epinephrine. Completed without difficulty  Advised to call if fevers/chills, erythema, induration, drainage, or persistent bleeding.  Images permanently stored and available for review in PACS.  Impression: Technically successful ultrasound guided median nerve hydrodissection.  Procedure: Real-time Ultrasound Guided injection of the left 1st [facility] Device: Samsung HS60  Verbal informed consent obtained.  Time-out conducted.  Noted no overlying erythema, induration, or other signs of local infection.  Skin prepped in a sterile fashion.  Local anesthesia: Topical Ethyl chloride.  With sterile technique and under real time ultrasound guidance:  1/2 cc lidocaine, 1/2 cc kenalog 40 injected easily completed without difficulty  Pain immediately resolved suggesting accurate placement of the medication.  Advised to call if fevers/chills, erythema, induration, drainage, or persistent bleeding.  Images permanently stored and available for review in PACS.  Impression: Technically successful ultrasound guided injection.  ___________________________________________ Gwen Her. Dianah Field,  M.D., ABFM., CAQSM. Primary Care and [place] Instructor of [place] [facility] of Medicine  Independent interpretation of notes and tests performed by another provider:   None.  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 She knows she needs surgery, but is hoping to put this off, repeat injection today. She does have right-sided pain as well, since we are doing a left CMC injection and a right carpal tunnel hydrodissection we will hope for systemic effect, if not she can return and we can inject her right CMC.  Carpal tunnel syndrome, bilateral Right-sided carpal tunnel symptoms are recurring, right-sided median nerve hydrodissection today.    ___________________________________________ Gwen Her. Dianah Field, M.D., ABFM., CAQSM. Primary Care and [place] Instructor of [place] [facility] of Medicine

## 2020-07-10 NOTE — Assessment & Plan Note (Signed)
Right-sided carpal tunnel symptoms are recurring, right-sided median nerve hydrodissection today.

## 2020-07-23 ENCOUNTER — Other Ambulatory Visit: Payer: Self-pay

## 2020-07-23 MED ORDER — METFORMIN HCL 1000 MG PO TABS
1000.0000 mg | ORAL_TABLET | Freq: Two times a day (BID) | ORAL | 1 refills | Status: DC
Start: 2020-07-23 — End: 2021-01-29

## 2020-07-26 ENCOUNTER — Ambulatory Visit (INDEPENDENT_AMBULATORY_CARE_PROVIDER_SITE_OTHER): Payer: Medicare HMO | Admitting: Family Medicine

## 2020-07-26 ENCOUNTER — Other Ambulatory Visit: Payer: Self-pay

## 2020-07-26 ENCOUNTER — Ambulatory Visit (HOSPITAL_COMMUNITY)
Admission: RE | Admit: 2020-07-26 | Discharge: 2020-07-26 | Disposition: A | Discharge status: Home / Self Care | Payer: Medicare HMO | Source: Ambulatory Visit | Attending: Family Medicine | Admitting: Family Medicine

## 2020-07-26 VITALS — BP 149/87 | HR 77 | Wt 281.0 lb

## 2020-07-26 DIAGNOSIS — R42 Dizziness and giddiness: Secondary | ICD-10-CM

## 2020-07-26 DIAGNOSIS — I160 Hypertensive urgency: Secondary | ICD-10-CM | POA: Insufficient documentation

## 2020-07-26 DIAGNOSIS — I10 Essential (primary) hypertension: Secondary | ICD-10-CM

## 2020-07-26 LAB — GLUCOSE, POCT (MANUAL RESULT ENTRY): POC Glucose: 96 mg/dl (ref 70–99)

## 2020-07-26 MED ORDER — HYDRALAZINE HCL 50 MG PO TABS: 50.0000 mg | ORAL_TABLET | Freq: Three times a day (TID) | ORAL | 3 refills | Status: DC

## 2020-07-26 MED ORDER — CLONIDINE HCL 0.1 MG PO TABS
0.1000 mg | ORAL_TABLET | Freq: Once | ORAL | Status: AC
  Administered 2020-07-26: 0.1 mg via ORAL

## 2020-07-26 NOTE — Patient Instructions (Addendum)
Your blood pressure was quite elevated in clinic today.  Your EKG looked unchanged.  As we discussed, I gave you an extra pill to see if we can get your blood pressure down today.  It was 962 systolic I would like that to be closer to 140 or 150 at the highest.  I reviewed your cardiology notes.  Until you see your cardiologist back, I would recommend you add an additional dose of midday hydralazine taking 1 tablet 3 times a day.  Additionally your other medications include your pindolol which is twice a day and your lisinopril which is once a day.  You should also continue your torsemide once a day.  Continue all your other medicines for diabetes as you have been.  We are going to schedule you an appointment with [place] early next week just to check on your blood pressure.  As we discussed, blood pressures at this level with symptoms such as dizziness or blurred vision are very worrisome.  If you have the symptoms get worse or you have new symptoms of chest pain or shortness of breath, you need to go immediately to the emergency room.   We will call you and reschedule the [facility] visit

## 2020-07-26 NOTE — Progress Notes (Signed)
    CHIEF COMPLAINT / HPI: Patient was originally scheduled for colposcopy.  Upon arrival to the examination room she told the nurse that she was dizzy.  We took her blood pressure which was quite elevated.  She also was seeing some dancing spots in front of her eyes.  All this started sometime yesterday.  She has not eaten breakfast.  She has missed some of her medications in the last few days.  She is having no chest pain, no shortness of breath, no unusual lower extremity edema.   PERTINENT  PMH / PSH: I have reviewed the patient's medications, allergies, past medical and surgical history, smoking status and updated in the EMR as appropriate.   OBJECTIVE:  BP (!) 190/110   Pulse 77   Wt 281 lb (127.5 kg)   LMP 04/06/2017   SpO2 96%   BMI 49.78 kg/m  GENERAL: Well-developed overweight female no acute distress CARDIOVASCULAR: Regular rate and rhythm without murmur gallop or rub LUNGS: Clear to auscultation bilaterally without rales or wheeze  CHEST: Nontender to palpation  EXTREMITY: No edema.  Dorsalis pedis pulses are 2+ bilaterally symmetrical. NEURO: Cranial nerves II through XII are grossly intact.  Pupil exam of the posterior eye is difficult but I do not see any papilledema.  She has no noted photosensitivity. PSYCH: AxOx4. Good eye contact.. No psychomotor retardation or agitation. Appropriate speech fluency and content. Asks and answers questions appropriately. Mood is congruent.   ASSESSMENT / PLAN:   Hypertension Quite elevated blood pressure today with some symptoms.  I called her cardiologist office and there was no one available from the care team that I can speak with immediately.  I discussed options with her including transfer to the emergency department which she really did not want to do.  Given the current waiting situation with the emergency department patient's who are sometimes waiting up to 24 hours, I think it would probably be in the best interest to treat  her here with some clonidine 0.1 mg p.o., see if we can get her blood pressure down a little bit.  I will make the change of increasing her hydralazine from 50 mg twice daily to 50 mg 3 times daily; we will see her back early next week.  Ideally I had like to get her back in with her cardiologist in the next few weeks as well.  We discussed red flags such as stroke symptoms, ACS, other things that would warrant her immediate visit to the emergency department.  She seems to understand.  Spent a good while discussing absolute need for medication adherence particularly in the next week or so.  She is also still smoking.  Total time spent face-to-face and in evaluation including EKG, ordering lab work, attempted discussion with cardiology, setting up follow-up was 45 minutes.   We will call you and reschedule the [facility] visit Dorcas Mcmurray MD  Patient felt much better and her pressure had steadily come down after the clonidine. I reviewed her meds and the changes. See AVS

## 2020-07-26 NOTE — Assessment & Plan Note (Signed)
Quite elevated blood pressure today with some symptoms.  I called her cardiologist office and there was no one available from the care team that I can speak with immediately.  I discussed options with her including transfer to the emergency department which she really did not want to do.  Given the current waiting situation with the emergency department patient's who are sometimes waiting up to 24 hours, I think it would probably be in the best interest to treat her here with some clonidine 0.1 mg p.o., see if we can get her blood pressure down a little bit.  I will make the change of increasing her hydralazine from 50 mg twice daily to 50 mg 3 times daily; we will see her back early next week.  Ideally I had like to get her back in with her cardiologist in the next few weeks as well.  We discussed red flags such as stroke symptoms, ACS, other things that would warrant her immediate visit to the emergency department.  She seems to understand.  Spent a good while discussing absolute need for medication adherence particularly in the next week or so.  She is also still smoking.  Total time spent face-to-face and in evaluation including EKG, ordering lab work, attempted discussion with cardiology, setting up follow-up was 45 minutes.

## 2020-07-30 NOTE — Patient Instructions (Addendum)
It was great to see you!  Our plans for today:  -Today we discussed your previously elevated blood pressures.  We are making the following medication changes: We are increasing your pindolol to 20 mg twice per day. -We also checked your A1c.  Previously this was 6.3, today it is 6.3. -I recommend that you have your diabetic eye exam if you have not yet had this this year. -Due to your recently elevated blood pressures I do recommend that you schedule follow-up appointment with your cardiologist.  We are checking some labs today, I will call you if they are abnormal will send you a MyChart message or a letter if they are normal.  If you do not hear about your labs in the next 2 weeks please let us know.  Take care and seek immediate care sooner if you develop any concerns.   Dr. [facility]

## 2020-07-30 NOTE — Progress Notes (Signed)
SUBJECTIVE:   CHIEF COMPLAINT / HPI:   Hypertension: Patient is a 59 year old female that presents today for follow-up of elevated blood pressure which was noted when she attempted to have her colposcopy on 10/7.  At that time her blood pressure was elevated at 190/110.  The provider at that time provided clonidine.  Recommended for follow-up the following week with PCP.  The provider at that time also increased heart hydralazine from 50 mg twice daily to 50 mg 3 times daily.   Patient states that last Monday she wasn't able to do her dental work due to elevated blood pressure. Patient states she was supposed to get labs to check her kidney function last visit but didn't have this drawn.  Patient states she previously was on amlodipine which she did not like due to the lower limb swelling which complicate things with her history of CHF.  She also tried hydrochlorothiazide in the past and found it to be no benefit.  Type 2 diabetes: Patient's current medications include Jardiance 25 mg/day, Victoza 0.6 mg -2 clicks weekly, Metformin 1000 mg twice daily.  Previous A1c completed in April was well controlled at 6.3. .  Patient endorses good compliance with these medications.  History of CHF Patient also has a history of CHF with preserved ejection fraction.  She states that her home medications include torsemide as well as potassium supplementation for this.  She endorses no issues with this of late.  She states that she has dropped about 10 pounds over the past couple of weeks and feels really good overall.  Denies any lower limb swelling.   PERTINENT  PMH / PSH: History of heart failure with preserved ejection fraction  OBJECTIVE:   BP (!) 142/78   Pulse 87   Ht 5' 3"  (1.6 m)   Wt 271 lb (122.9 kg)   LMP 04/06/2017   SpO2 94%   BMI 48.01 kg/m    140/82 on recheck  General: NAD, pleasant, able to participate in exam Cardiac: RRR, no murmurs. Respiratory: CTAB, normal  effort Extremities: no edema or cyanosis. Skin: warm and dry, no rashes noted Neuro: alert, no obvious focal deficits Psych: Normal affect and mood  ASSESSMENT/PLAN:   T2DM (type 2 diabetes mellitus) ([place]) Assessment: Well-controlled type 2 diabetes with A1c today of 6.3. Plan: -We will continue current medications -Follow-up in 3 months for A1c recheck -Patient plans to get her diabetic eye exam in the next few months.  Chronic diastolic CHF (congestive heart failure) ([facility]) Assessment: History of diastolic CHF.  Home medications include torsemide.  Patient has endorsed a recent weight loss of about 10 pounds over the past few weeks, does not appear fluid overloaded on physical exam.  States good compliance with her medications.  Due to the fact the patient does take torsemide as well as potassium soft month we will check BMP today. Plan: -BMP as above -We will increase pindolol for blood pressure management  Hypertension Assessment: Much better controlled blood pressure with 140/78 today after increasing patient's hydralazine.  Home medications include hydralazine, pindolol, lisinopril, torsemide for CHF, patient is also on an SGLT 2 which can provide some blood pressure assistance. Plan: -Discussed patient with Dr. Andria Frames -Due to patient's heart rate in the upper 70s to low 80s over past few visits and the fact that patient's pindolol does have some room to increase will increase this to 20 mg twice per day -Recommended patient follow-up in the next 2 weeks for blood pressure  check -Patient does have a blood pressure cuff at home and plans to check her blood pressures with this    Lurline Del, [place]    This note was prepared using Dragon voice recognition software and may include unintentional dictation errors due to the inherent limitations of voice recognition software.

## 2020-07-31 ENCOUNTER — Encounter: Payer: Self-pay | Admitting: Family Medicine

## 2020-07-31 ENCOUNTER — Ambulatory Visit (INDEPENDENT_AMBULATORY_CARE_PROVIDER_SITE_OTHER): Payer: Medicare HMO | Admitting: Family Medicine

## 2020-07-31 ENCOUNTER — Other Ambulatory Visit: Payer: Self-pay

## 2020-07-31 VITALS — BP 142/78 | HR 87 | Ht 63.0 in | Wt 271.0 lb

## 2020-07-31 DIAGNOSIS — E119 Type 2 diabetes mellitus without complications: Secondary | ICD-10-CM

## 2020-07-31 DIAGNOSIS — I5032 Chronic diastolic (congestive) heart failure: Secondary | ICD-10-CM | POA: Diagnosis not present

## 2020-07-31 DIAGNOSIS — I10 Essential (primary) hypertension: Secondary | ICD-10-CM

## 2020-07-31 LAB — POCT GLYCOSYLATED HEMOGLOBIN (HGB A1C): HbA1c, POC (controlled diabetic range): 6.3 % (ref 0.0–7.0)

## 2020-07-31 NOTE — Assessment & Plan Note (Signed)
Assessment: Much better controlled blood pressure with 140/78 today after increasing patient's hydralazine.  Home medications include hydralazine, pindolol, lisinopril, torsemide for CHF, patient is also on an SGLT 2 which can provide some blood pressure assistance. Plan: -Discussed patient with Dr. Andria Frames -Due to patient's heart rate in the upper 70s to low 80s over past few visits and the fact that patient's pindolol does have some room to increase will increase this to 20 mg twice per day -Recommended patient follow-up in the next 2 weeks for blood pressure check -Patient does have a blood pressure cuff at home and plans to check her blood pressures with this

## 2020-07-31 NOTE — Assessment & Plan Note (Addendum)
Assessment: Well-controlled type 2 diabetes with A1c today of 6.3. Plan: -We will continue current medications -Follow-up in 3 months for A1c recheck -Patient plans to get her diabetic eye exam in the next few months.

## 2020-07-31 NOTE — Assessment & Plan Note (Signed)
Assessment: History of diastolic CHF.  Home medications include torsemide.  Patient has endorsed a recent weight loss of about 10 pounds over the past few weeks, does not appear fluid overloaded on physical exam.  States good compliance with her medications.  Due to the fact the patient does take torsemide as well as potassium soft month we will check BMP today. Plan: -BMP as above -We will increase pindolol for blood pressure management

## 2020-08-01 LAB — BASIC METABOLIC PANEL
BUN/Creatinine Ratio: 15 (ref 9–23)
BUN: 18 mg/dL (ref 6–24)
CO2: 22 mmol/L (ref 20–29)
Calcium: 9.6 mg/dL (ref 8.7–10.2)
Chloride: 103 mmol/L (ref 96–106)
Creatinine, Ser: 1.2 mg/dL — ABNORMAL HIGH (ref 0.57–1.00)
GFR calc Af Amer: 58 mL/min/{1.73_m2} — ABNORMAL LOW (ref >59)
GFR calc non Af Amer: 50 mL/min/{1.73_m2} — ABNORMAL LOW (ref >59)
Glucose: 83 mg/dL (ref 65–99)
Potassium: 4 mmol/L (ref 3.5–5.2)
Sodium: 143 mmol/L (ref 134–144)

## 2020-08-02 ENCOUNTER — Other Ambulatory Visit: Payer: Self-pay | Admitting: Family Medicine

## 2020-08-07 ENCOUNTER — Other Ambulatory Visit: Payer: Self-pay | Admitting: Family Medicine

## 2020-08-09 ENCOUNTER — Telehealth: Payer: Self-pay

## 2020-08-09 DIAGNOSIS — R0789 Other chest pain: Secondary | ICD-10-CM | POA: Diagnosis not present

## 2020-08-09 DIAGNOSIS — R079 Chest pain, unspecified: Secondary | ICD-10-CM | POA: Diagnosis not present

## 2020-08-09 DIAGNOSIS — R42 Dizziness and giddiness: Secondary | ICD-10-CM | POA: Diagnosis not present

## 2020-08-09 DIAGNOSIS — R000 Tachycardia, unspecified: Secondary | ICD-10-CM | POA: Diagnosis not present

## 2020-08-09 DIAGNOSIS — G4489 Other headache syndrome: Secondary | ICD-10-CM | POA: Diagnosis not present

## 2020-08-09 NOTE — Telephone Encounter (Signed)
Patient calls nurse line reporting dizziness, SOB, and chest pain. Patient reports, "I just feel really off," and I can barely get around with out feeling faint. Patient reports her BP meds have been adjusted over the last few weeks, however she started feeling "funny" this week, the worst today. Patient reprots compliance to medications. I asked patient to take her BP, she stated, "I dont think my cuff works right." Given symptoms I advised patient she should be evaluated immediately at [facility] or ED. I advised if someone could not take her to call EMS. Patient agreed with plan and was appreciative.

## 2020-08-13 ENCOUNTER — Other Ambulatory Visit: Payer: Self-pay | Admitting: Family Medicine

## 2020-08-13 ENCOUNTER — Telehealth: Payer: Self-pay

## 2020-08-13 MED ORDER — PINDOLOL 10 MG PO TABS
20.0000 mg | ORAL_TABLET | Freq: Two times a day (BID) | ORAL | 1 refills | Status: DC
Start: 2020-08-13 — End: 2020-10-04

## 2020-08-13 NOTE — Telephone Encounter (Signed)
Patient calls nurse line stating the pharmacy has not received Pindolol. Patient reports she thought this was increased and to be sent in. Will forward to PCP.

## 2020-08-13 NOTE — Progress Notes (Signed)
Refill sent.

## 2020-08-13 NOTE — Telephone Encounter (Signed)
Refill sent. Patient called and HIPAA compliant voicemail left.

## 2020-08-17 ENCOUNTER — Other Ambulatory Visit: Payer: Self-pay | Admitting: Gastroenterology

## 2020-08-21 ENCOUNTER — Other Ambulatory Visit: Payer: Self-pay

## 2020-08-21 ENCOUNTER — Encounter: Payer: Self-pay | Admitting: Family Medicine

## 2020-08-21 ENCOUNTER — Ambulatory Visit (INDEPENDENT_AMBULATORY_CARE_PROVIDER_SITE_OTHER): Payer: Medicare HMO | Admitting: Family Medicine

## 2020-08-21 VITALS — BP 160/90 | HR 77 | Ht 63.0 in | Wt 279.1 lb

## 2020-08-21 DIAGNOSIS — I10 Essential (primary) hypertension: Secondary | ICD-10-CM

## 2020-08-21 NOTE — Progress Notes (Signed)
° ° °  SUBJECTIVE:   CHIEF COMPLAINT / HPI:   Hypertension follow-up Patient is a 59 year old female that presents today for hypertension follow-up. During most recent appointment patient's home medications which normally include hydralazine, pindolol, lisinopril, torsemide, as well as an SGLT2 were adjusted with the pindolol being increased to 20 mg twice per day.  Patient follows up today for blood pressure check. Has been seeing pressures at home in the 160s. Might miss a dose or two on occasion.  PERTINENT  PMH / PSH: History of hypertension  OBJECTIVE:   BP (!) 160/90 Comment: PROVIDER INFORMED   Pulse 77    Ht 5' 3"  (1.6 m)    Wt 279 lb 2 oz (126.6 kg)    LMP 04/06/2017    SpO2 96%    BMI 49.44 kg/m    General: NAD, pleasant, able to participate in exam Cardiac: RRR, no murmurs. Respiratory: CTAB, normal effort Abdomen: Bowel sounds present Extremities: No lower limb edema. 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  Patient home medications currently include hydralazine, pindolol, lisinopril, torsemide.  Patient is also on an SGLT2 medication.  Patient's blood pressure remains elevated in the 160s over 90s.  Patient states that she has checked her pressures at home and these have also been in the same range.  Patient does have a family history of hypertension as well as another family member that require dialysis.  This is the patient's greatest concern that she will progress to that point. Plan: -Discussed patient with preceptor, also discussed patient with pharmacy team. -Referral to nephrology placed -Recommended patient give [place] a call if her pressures are above 170/100 at home.    Lurline Del, [place]    This note was prepared using Dragon voice recognition software and may include unintentional dictation  errors due to the inherent limitations of voice recognition software.

## 2020-08-21 NOTE — Patient Instructions (Signed)
It was great to see you!  Our plans for today:  -I am sending a referral to nephrology, the kidney specialist to determine what other changes we can make to help with your high blood pressure.  Expect to hear from them in the next 1 to 2 weeks.  If you do not hear from them in that time please let me know. -I would like for you to continue to check your blood pressures and let us know if you note any numbers with a top number over 170 or a bottom number over 100.  Take care and seek immediate care sooner if you develop any concerns.   Dr. [facility]

## 2020-08-21 NOTE — Assessment & Plan Note (Signed)
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  Patient home medications currently include hydralazine, pindolol, lisinopril, torsemide.  Patient is also on an SGLT2 medication.  Patient's blood pressure remains elevated in the 160s over 90s.  Patient states that Hailey Barnes has checked her pressures at home and these have also been in the same range.  Patient does have a family history of hypertension as well as another family member that require dialysis.  This is the patient's greatest concern that Hailey Barnes will progress to that point. Plan: -Discussed patient with preceptor, also discussed patient with pharmacy team. -Referral to nephrology placed -Recommended patient give [place] a call if her pressures are above 170/100 at home.

## 2020-08-30 ENCOUNTER — Encounter: Payer: Self-pay | Admitting: Family Medicine

## 2020-08-30 ENCOUNTER — Other Ambulatory Visit: Payer: Self-pay | Admitting: Family Medicine

## 2020-08-30 DIAGNOSIS — I10 Essential (primary) hypertension: Secondary | ICD-10-CM | POA: Diagnosis not present

## 2020-08-30 DIAGNOSIS — E118 Type 2 diabetes mellitus with unspecified complications: Secondary | ICD-10-CM | POA: Diagnosis not present

## 2020-08-30 DIAGNOSIS — K7581 Nonalcoholic steatohepatitis (NASH): Secondary | ICD-10-CM | POA: Diagnosis not present

## 2020-08-30 DIAGNOSIS — G4733 Obstructive sleep apnea (adult) (pediatric): Secondary | ICD-10-CM | POA: Diagnosis not present

## 2020-09-06 DIAGNOSIS — I10 Essential (primary) hypertension: Secondary | ICD-10-CM | POA: Diagnosis not present

## 2020-09-17 ENCOUNTER — Other Ambulatory Visit: Payer: Self-pay | Admitting: Family Medicine

## 2020-09-21 ENCOUNTER — Encounter (INDEPENDENT_AMBULATORY_CARE_PROVIDER_SITE_OTHER): Payer: Self-pay

## 2020-09-24 ENCOUNTER — Other Ambulatory Visit: Payer: Self-pay

## 2020-09-24 ENCOUNTER — Emergency Department (HOSPITAL_COMMUNITY)
Admission: EM | Admit: 2020-09-24 | Discharge: 2020-09-24 | Disposition: A | Discharge status: Home / Self Care | Payer: Medicare HMO | Attending: Emergency Medicine | Admitting: Emergency Medicine

## 2020-09-24 ENCOUNTER — Encounter (HOSPITAL_COMMUNITY): Payer: Self-pay

## 2020-09-24 ENCOUNTER — Emergency Department (HOSPITAL_COMMUNITY): Payer: Medicare HMO

## 2020-09-24 DIAGNOSIS — Z7984 Long term (current) use of oral hypoglycemic drugs: Secondary | ICD-10-CM | POA: Insufficient documentation

## 2020-09-24 DIAGNOSIS — R0602 Shortness of breath: Secondary | ICD-10-CM | POA: Insufficient documentation

## 2020-09-24 DIAGNOSIS — F17210 Nicotine dependence, cigarettes, uncomplicated: Secondary | ICD-10-CM | POA: Insufficient documentation

## 2020-09-24 DIAGNOSIS — I471 Supraventricular tachycardia: Secondary | ICD-10-CM

## 2020-09-24 DIAGNOSIS — E119 Type 2 diabetes mellitus without complications: Secondary | ICD-10-CM | POA: Insufficient documentation

## 2020-09-24 DIAGNOSIS — J449 Chronic obstructive pulmonary disease, unspecified: Secondary | ICD-10-CM | POA: Insufficient documentation

## 2020-09-24 DIAGNOSIS — I5032 Chronic diastolic (congestive) heart failure: Secondary | ICD-10-CM | POA: Insufficient documentation

## 2020-09-24 DIAGNOSIS — Z7982 Long term (current) use of aspirin: Secondary | ICD-10-CM | POA: Insufficient documentation

## 2020-09-24 DIAGNOSIS — I10 Essential (primary) hypertension: Secondary | ICD-10-CM | POA: Diagnosis not present

## 2020-09-24 DIAGNOSIS — R001 Bradycardia, unspecified: Secondary | ICD-10-CM | POA: Diagnosis not present

## 2020-09-24 DIAGNOSIS — Z79899 Other long term (current) drug therapy: Secondary | ICD-10-CM | POA: Insufficient documentation

## 2020-09-24 DIAGNOSIS — R079 Chest pain, unspecified: Secondary | ICD-10-CM | POA: Diagnosis not present

## 2020-09-24 DIAGNOSIS — R000 Tachycardia, unspecified: Secondary | ICD-10-CM | POA: Diagnosis not present

## 2020-09-24 DIAGNOSIS — R52 Pain, unspecified: Secondary | ICD-10-CM | POA: Diagnosis not present

## 2020-09-24 DIAGNOSIS — R002 Palpitations: Secondary | ICD-10-CM | POA: Diagnosis present

## 2020-09-24 DIAGNOSIS — I110 Hypertensive heart disease with heart failure: Secondary | ICD-10-CM | POA: Diagnosis not present

## 2020-09-24 LAB — CBC WITH DIFFERENTIAL/PLATELET
Abs Immature Granulocytes: 0.08 10*3/uL — ABNORMAL HIGH (ref 0.00–0.07)
Basophils Absolute: 0.1 10*3/uL (ref 0.0–0.1)
Basophils Relative: 1 %
Eosinophils Absolute: 0.2 10*3/uL (ref 0.0–0.5)
Eosinophils Relative: 1 %
HCT: 50 % — ABNORMAL HIGH (ref 36.0–46.0)
Hemoglobin: 16.6 g/dL — ABNORMAL HIGH (ref 12.0–15.0)
Immature Granulocytes: 1 %
Lymphocytes Relative: 28 %
Lymphs Abs: 3.6 10*3/uL (ref 0.7–4.0)
MCH: 28 pg (ref 26.0–34.0)
MCHC: 33.2 g/dL (ref 30.0–36.0)
MCV: 84.5 fL (ref 80.0–100.0)
Monocytes Absolute: 0.9 10*3/uL (ref 0.1–1.0)
Monocytes Relative: 7 %
Neutro Abs: 8.1 10*3/uL — ABNORMAL HIGH (ref 1.7–7.7)
Neutrophils Relative %: 62 %
Platelets: 259 10*3/uL (ref 150–400)
RBC: 5.92 MIL/uL — ABNORMAL HIGH (ref 3.87–5.11)
RDW: 15.9 % — ABNORMAL HIGH (ref 11.5–15.5)
WBC: 12.9 10*3/uL — ABNORMAL HIGH (ref 4.0–10.5)
nRBC: 0 % (ref 0.0–0.2)

## 2020-09-24 LAB — BASIC METABOLIC PANEL
Anion gap: 10 (ref 5–15)
BUN: 14 mg/dL (ref 6–20)
CO2: 23 mmol/L (ref 22–32)
Calcium: 9.2 mg/dL (ref 8.9–10.3)
Chloride: 105 mmol/L (ref 98–111)
Creatinine, Ser: 1.2 mg/dL — ABNORMAL HIGH (ref 0.44–1.00)
GFR, Estimated: 53 mL/min — ABNORMAL LOW (ref >60)
Glucose, Bld: 83 mg/dL (ref 70–99)
Potassium: 4 mmol/L (ref 3.5–5.1)
Sodium: 138 mmol/L (ref 135–145)

## 2020-09-24 LAB — URINALYSIS, ROUTINE W REFLEX MICROSCOPIC
Bacteria, UA: NONE SEEN
Bilirubin Urine: NEGATIVE
Glucose, UA: >=500 mg/dL — AB
Hgb urine dipstick: NEGATIVE
Ketones, ur: NEGATIVE mg/dL
Nitrite: NEGATIVE
Protein, ur: NEGATIVE mg/dL
Specific Gravity, Urine: 1.02 (ref 1.005–1.030)
pH: 5 (ref 5.0–8.0)

## 2020-09-24 MED ORDER — SODIUM CHLORIDE 0.9 % IV SOLN: INTRAVENOUS | Status: DC

## 2020-09-24 MED ORDER — NITROFURANTOIN MONOHYD MACRO 100 MG PO CAPS: 100.0000 mg | ORAL_CAPSULE | Freq: Two times a day (BID) | ORAL | 0 refills | Status: DC

## 2020-09-24 NOTE — ED Triage Notes (Signed)
Per [facility]: Pt initally called out for chest pain, shortness of breath, and diaphoresis. Pt started feeling weak and unwell this AM. Pt found by EMS to be tachycardic around 150 with BP around 200/130. Pt given 6 mg of adenosine, converted to NSR with rate about 70 BPM. Also given about 150 ml of saline. Pt with 18 G IV in left AC. Pt states that she feels better.

## 2020-09-24 NOTE — ED Provider Notes (Signed)
[facility] EMERGENCY DEPARTMENT Provider Note   CSN: [id number] Arrival date & time: 09/24/20  1905     History Chief Complaint  Patient presents with  . Atrial Fibrillation    Hailey Barnes is a 59 y.o. female.  HPI She presents BMS for evaluation of chest discomfort, palpitations and shortness of breath.  She was seen by me at 7:10 PM.  Reports onset of symptoms today.  Per EMS initially her heart rate was 150 with a blood pressure 200/130.  She was treated with adenosine 6 mg with conversion to normal sinus rhythm with a rate of 70.  She was treated with saline, IV, 150 mg, during transport.  Patient reports her symptoms have resolved after treatment by EMS.  Rhythm strips, reviewed by me, with patient from EMS.  Indicate conversion from heart rate of 147 to normal sinus rhythm with slight sinus pause.  Patient denies recent illnesses including fever, chills, nausea, vomiting,, shortness of breath or chest pain.  No known sick contacts.  No prior similar problem.  She states she is taking her usual medications as prescribed.  There are no other known modifying factors.    Past Medical History:  Diagnosis Date  . Arthritis    "knees" (02/21/2014), back  . CHF (congestive heart failure) ([place])   . Chronic bronchitis ([place])    "get it q yr" (02/21/2014)  . Chronic diastolic CHF (congestive heart failure) ([place])    a. Echo 10/23/2016: EF 75 %  . Chronic lower back pain   . Complication of anesthesia    "I don't come out well; I chew on my tongue"  . COPD (chronic obstructive pulmonary disease) ([place])   . Diabetes mellitus without complication ([place])    TYPE 2  . Family history of adverse reaction to anesthesia    BROTHER PONV, had a blood clot several days later and heart stopped  . Fatty liver    NONALCOLIC  . Fibroid   . Heart murmur   . Hypertension   . Infection of right eye    current dx on Saturday 7/15 - using drops  . Leg swelling    bilateral  .  Migraine 1982-2009  . MVP (mitral valve prolapse)   . Neuromuscular disorder ([place])    right leg nerve pain  . Shortness of breath    WITH EXERTION  . Sinus pause    a. noted on telemetry during admission from 10/2016, lasting up to 4.4 seconds. BB discontinued.   . Sleep apnea 02/2016   CPAP 16 TO 20  . Stroke [facility]) noted on CAT 02/2014   "light", LLE weakness remains (03/30/2014)  . 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 Tingling in extremities 12/12/2010   Uric acid and electrolytes (02/23) normal but WBC elevated.   D/Dx: carpal tunnel, ulnar neuropathy Less likely: cervical radiculopathy, vasculitis     Patient Active Problem List   Diagnosis Date Noted  . Lesion of cervix 07/05/2020  . Right knee pain 05/07/2020  . Throat pain in adult 04/11/2020  . COPD (chronic obstructive pulmonary disease) ([place]) 02/17/2020  . Impingement syndrome, shoulder, right 08/29/2019  . Hyperuricemia 05/10/2019  . Foot pain, right 05/06/2019  . Nodule of skin of right thumb 04/15/2019  . Leg cramps 04/01/2019  . Tinea cruris 03/31/2019  . Lack of appetite 03/15/2019  . Wart on thumb 09/23/2018  . Acute medial meniscus  tear of left knee 08/16/2018  . Plica of knee, left 94/17/4081  . Chondromalacia, left knee 08/16/2018  . Primary osteoarthritis of both knees 03/24/2018  . Bilateral primary osteoarthritis of first carpometacarpal joints 11/16/2017  . De Quervain's tenosynovitis, right 11/02/2017  . Carpal tunnel syndrome, bilateral 10/21/2017  . Vaginal itching 09/22/2017  . Dysuria 09/22/2017  . High risk sexual behavior 08/27/2017  . Benign neoplasm of sigmoid colon   . Insomnia 02/17/2017  . OSA (obstructive sleep apnea) 10/30/2016  . Sinus pause 10/23/2016  . Panic attack 10/22/2016  . Lower extremity edema   . Left lumbar radiculopathy 06/02/2016  . Hyperlipidemia 02/07/2016  . T2DM (type 2 diabetes mellitus) ([place]) 12/07/2015   . Occipital lymphadenopathy 09/21/2015  . Depression 04/19/2015  . Eczema 08/01/2014  . Diminished vision 06/23/2014  . Chronic diastolic CHF (congestive heart failure) ([place]) 06/13/2014  . Restrictive lung disease 05/10/2014  . Vaginal discharge 03/22/2014  . NASH (nonalcoholic steatohepatitis) 01/04/2014  . Hypertension 05/26/2010  . Morbid obesity ([place]) 12/17/2006  . Tobacco use disorder 12/17/2006    Past Surgical History:  Procedure Laterality Date  . [place]  . CHOLECYSTECTOMY N/A 06/17/2017   Procedure: LAPAROSCOPIC CHOLECYSTECTOMY WITH INTRAOPERATIVE CHOLANGIOGRAM;  Surgeon: Donnie Mesa, MD;  Location: [facility];  Service: General;  Laterality: N/A;  . COLONOSCOPY WITH PROPOFOL N/A 04/28/2017   Procedure: COLONOSCOPY WITH PROPOFOL;  Surgeon: Manus Gunning, MD;  Location: [facility];  Service: Gastroenterology;  Laterality: N/A;  . DILATATION & CURETTAGE/HYSTEROSCOPY WITH MYOSURE N/A 05/06/2016   Procedure: DILATATION & CURETTAGE/HYSTEROSCOPY WITH MYOSURE;  Surgeon: Terrance Mass, MD;  Location: [facility];  Service: Gynecology;  Laterality: N/A;  request to follow around 8:45  requests one hour OR time  . DILATATION & CURETTAGE/HYSTEROSCOPY WITH MYOSURE N/A 04/03/2017   Procedure: DILATATION & CURETTAGE/HYSTEROSCOPY WITH MYOSURE;  Surgeon: Terrance Mass, MD;  Location: [facility];  Service: Gynecology;  Laterality: N/A;  . DILATION AND CURETTAGE OF UTERUS    . ESOPHAGOGASTRODUODENOSCOPY (EGD) WITH PROPOFOL N/A 04/28/2017   Procedure: ESOPHAGOGASTRODUODENOSCOPY (EGD) WITH PROPOFOL;  Surgeon: Manus Gunning, MD;  Location: [facility];  Service: Gastroenterology;  Laterality: N/A;  . FOOT SURGERY Bilateral    "took bones out; put pins in"  . IR ABLATE LIVER CRYOABLATION  02/08/2020  . IR RADIOLOGIST EVAL & MGMT  02/03/2020  . KNEE ARTHROSCOPY Left 08/16/2018   Procedure: ARTHROSCOPY KNEE PARTIAL MEDIAL MENISECTOMY, CHONDROPLASTY MEDIAL AND LATERAL,  PLICA RELEASE MEDIAL;  Surgeon: Dorna Leitz, MD;  Location: [facility];  Service: Orthopedics;  Laterality: Left;  . LEFT AND RIGHT HEART CATHETERIZATION WITH CORONARY ANGIOGRAM N/A 03/31/2014   Procedure: LEFT AND RIGHT HEART CATHETERIZATION WITH CORONARY ANGIOGRAM;  Surgeon: Birdie Riddle, MD;  Location: [facility];  Service: Cardiovascular;  Laterality: N/A;  . MYOMECTOMY  YRS AGO  . sonohystogram  04/14/2016   [facility]: intramural fibroid, premenopausal endometrium, right fluid filled tubular structure     OB History    Gravida  1   Para  1   Term      Preterm      AB      Living  1     SAB      TAB      Ectopic      Multiple      Live Births              Family History  Problem Relation Age of Onset  . Hypertension  Mother   . COPD Mother   . Hypertension Father   . Cancer Sister        UTERINE???  . COPD Sister   . Hypertension Sister   . Hypertension Sister     Social History   Tobacco Use  . Smoking status: Current Every Day Smoker    Packs/day: 1.00    Years: 42.00    Pack years: 42.00    Types: Cigarettes    Start date: 10/20/1976  . Smokeless tobacco: Never Used  Vaping Use  . Vaping Use: Never used  Substance Use Topics  . Alcohol use: Yes    Comment: very occasional  . Drug use: Yes    Types: "Crack" cocaine, Marijuana    Comment: 03/30/2014 "stopped using crack 02/21/2014"    Home Medications Prior to Admission medications   Medication Sig Start Date End Date Taking? Authorizing Provider  torsemide (DEMADEX) 20 MG tablet TAKE 1 TABLET BY MOUTH EVERY DAY MAY TAKE 1 EXTRA TABLET DAILY ONLY AS NEEDED 09/17/20   Lurline Del, DO  acetaminophen (TYLENOL) 325 MG tablet Take 2 tablets (650 mg total) by mouth every 6 (six) hours as needed. 03/24/20   Darr, Edison Nasuti, PA-C  albuterol (VENTOLIN HFA) 108 (90 Base) MCG/ACT inhaler Inhale 1-2 puffs into the lungs every 6 (six) hours as needed for wheezing or shortness of breath.  03/24/20   Darr, Edison Nasuti, PA-C  allopurinol (ZYLOPRIM) 300 MG tablet TAKE 1 TABLET BY MOUTH EVERY DAY 03/08/20   Silverio Decamp, MD  aspirin EC 81 MG tablet Take 81 mg by mouth daily.    [provider]  atorvastatin (LIPITOR) 40 MG tablet Take 40 mg by mouth once a day 05/16/20   Lurline Del, DO  BREO ELLIPTA 200-25 MCG/INH AEPB TAKE 1 PUFF BY MOUTH EVERY DAY 01/02/20   Myles Gip, DO  empagliflozin (JARDIANCE) 25 MG TABS tablet Take 1 tablet (25 mg total) by mouth daily. 04/16/20   Myles Gip, DO  glucose blood (ACCU-CHEK AVIVA) test strip Use to check sugar up to 3 times daily 04/14/19   Zenia Resides, MD  hydrALAZINE (APRESOLINE) 50 MG tablet Take 1 tablet (50 mg total) by mouth 3 (three) times daily. 07/26/20 10/24/20  Dickie La, MD  KLOR-CON M20 20 MEQ tablet TAKE 1 TABLET BY MOUTH EVERY DAY 03/28/20   Myles Gip, DO  Lancets (ACCU-CHEK SOFT TOUCH) lancets Use as instructed 04/05/19   Myles Gip, DO  liraglutide (VICTOZA) 18 MG/3ML SOPN Inject 7.6LY minus 2 clicks under the skin once weekly. 02/17/20   Myles Gip, DO  lisinopril (ZESTRIL) 40 MG tablet Take 1 tablet (40 mg total) by mouth daily. 03/05/20   Isaiah Serge, NP  metFORMIN (GLUCOPHAGE) 1000 MG tablet Take 1 tablet (1,000 mg total) by mouth 2 (two) times daily with a meal. 07/23/20   Welborn, Ryan, DO  pindolol (VISKEN) 10 MG tablet Take 2 tablets (20 mg total) by mouth 2 (two) times daily. 08/13/20   Lurline Del, DO  tiotropium (SPIRIVA) 18 MCG inhalation capsule Place 1 capsule (18 mcg total) into inhaler and inhale daily. 12/14/18   Benay Pike, MD  triamcinolone (KENALOG) 0.025 % ointment APPLY 1 APPLICATION TOPICALLY 2 (TWO) TIMES DAILY AS NEEDED. 10/11/19   Guadalupe Dawn, MD    Allergies    Bupropion and Norvasc [amlodipine]  Review of Systems   Review of Systems  All other systems reviewed and are negative.  Physical Exam Updated Vital Signs BP (!) 133/99   Pulse  87   Temp 98 F (36.7 C) (Oral)   Resp 14   LMP 04/06/2017   SpO2 97%   Physical Exam Vitals and nursing note reviewed.  Constitutional:      General: She is not in acute distress.    Appearance: She is well-developed. She is obese. She is not ill-appearing, toxic-appearing or diaphoretic.  HENT:     Head: Normocephalic and atraumatic.  Eyes:     Conjunctiva/sclera: Conjunctivae normal.     Pupils: Pupils are equal, round, and reactive to light.  Neck:     Trachea: Phonation normal.  Cardiovascular:     Rate and Rhythm: Normal rate and regular rhythm.     Pulses: Normal pulses.  Pulmonary:     Effort: Pulmonary effort is normal. No respiratory distress.     Breath sounds: Normal breath sounds. No stridor.  Chest:     Chest wall: No tenderness.  Abdominal:     General: There is no distension.     Palpations: Abdomen is soft.     Tenderness: There is no abdominal tenderness. There is no guarding.  Musculoskeletal:        General: Normal range of motion.     Cervical back: Normal range of motion and neck supple.  Skin:    General: Skin is warm and dry.  Neurological:     Mental Status: She is alert and oriented to person, place, and time.     Motor: No abnormal muscle tone.  Psychiatric:        Mood and Affect: Mood normal.        Behavior: Behavior normal.        Thought Content: Thought content normal.        Judgment: Judgment normal.     ED Results / Procedures / Treatments   Labs (all labs ordered are listed, but only abnormal results are displayed) Labs Reviewed  BASIC METABOLIC PANEL  CBC WITH DIFFERENTIAL/PLATELET  URINALYSIS, ROUTINE W REFLEX MICROSCOPIC    EKG None  Radiology No results found.  Procedures Procedures (including critical care time)  Medications Ordered in ED Medications  0.9 %  sodium chloride infusion (has no administration in time range)    ED Course  I have reviewed the triage vital signs and the nursing notes.   Pertinent labs & imaging results that were available during my care of the patient were reviewed by me and considered in my medical decision making (see chart for details).  Clinical Course as of Sep 25 1504  Mon Sep 24, 2020  2314 [place]: 105 [EW]    Clinical Course User Index [EW] Daleen Bo, MD   MDM Rules/Calculators/A&P                           Patient Vitals for the past 24 hrs:  BP Temp Temp src Pulse Resp SpO2  09/24/20 1916 (!) 133/99 - - - - -  09/24/20 1914 - 57 F (36.7 C) Oral 87 14 97 %    At D/C- Reevaluation with update and discussion. After initial assessment and treatment, an updated evaluation reveals comfortable; findings discussed, questions answered. Daleen Bo   Medical Decision Making:  This patient is presenting for evaluation of palpitations and chest discomfort, which does require a range of treatment options, and is a complaint that involves a high risk of morbidity and mortality. The  differential diagnoses include ACS, SVT, atrial fibrillation. I decided to review old records, and in summary middle-aged female with history of multiple medical problems including congestive heart failure, diabetes, tobacco use and cardiac arrhythmias presenting with palpitations and chest discomfort.  I did not require additional historical information from anyone.  Clinical Laboratory Tests Ordered, included CBC, Metabolic panel and Urinalysis. Review indicates increased WBC and Hb, Creat. High, WBC in urine. Radiologic Tests Ordered, included CXR.  I independently Visualized: chest Images, which show normal     Critical Interventions-clinical evaluation, laboratory testing, radiography, observation reassessment  After These Interventions, the Patient was reevaluated and was found stable for discharge.  No recurrence of rapid heartbeat.  Patient with SVT versus atrial flutter, resolved after treatment with adenosine.  No high risk for worsening, and no  indication for hospitalization at this time.  CRITICAL CARE-no Performed by: Daleen Bo  Nursing Notes Reviewed/ Care Coordinated Applicable Imaging Reviewed Interpretation of Laboratory Data incorporated into ED treatment  The patient appears reasonably screened and/or stabilized for discharge and I doubt any other medical condition or other [facility] requiring further screening, evaluation, or treatment in the ED at this time prior to discharge.  Plan: Home Medications- continue usual; Home Treatments- rest, fluids; return here if the recommended treatment, does not improve the symptoms; Recommended follow up- PCP 1 week for check up     Final Clinical Impression(s) / ED Diagnoses Final diagnoses:  SVT (supraventricular tachycardia) ([place])    Rx / DC Orders ED Discharge Orders    None       Daleen Bo, MD 09/25/20 2316

## 2020-09-24 NOTE — Discharge Instructions (Addendum)
You are treated by EMS with adenosine for supraventricular tachycardia.  This could also have been atrial flutter.  It is important to follow-up with your doctor for checkup next week to see if you are getting better.  Urinalysis indicates infection and you have symptoms of a UTI.  We sent a prescription for an antibiotic to your pharmacy.  Make sure you are getting plenty of rest and drinking a lot of fluids to help treat your symptoms.  Return here if needed for problems.

## 2020-09-25 DIAGNOSIS — G4733 Obstructive sleep apnea (adult) (pediatric): Secondary | ICD-10-CM | POA: Diagnosis not present

## 2020-09-26 DIAGNOSIS — G4733 Obstructive sleep apnea (adult) (pediatric): Secondary | ICD-10-CM | POA: Diagnosis not present

## 2020-09-26 DIAGNOSIS — K7581 Nonalcoholic steatohepatitis (NASH): Secondary | ICD-10-CM | POA: Diagnosis not present

## 2020-09-26 DIAGNOSIS — E118 Type 2 diabetes mellitus with unspecified complications: Secondary | ICD-10-CM | POA: Diagnosis not present

## 2020-09-26 DIAGNOSIS — I10 Essential (primary) hypertension: Secondary | ICD-10-CM | POA: Diagnosis not present

## 2020-09-29 ENCOUNTER — Other Ambulatory Visit: Payer: Self-pay | Admitting: Family Medicine

## 2020-10-02 ENCOUNTER — Other Ambulatory Visit: Payer: Self-pay | Admitting: Sports Medicine

## 2020-10-02 DIAGNOSIS — E790 Hyperuricemia without signs of inflammatory arthritis and tophaceous disease: Secondary | ICD-10-CM

## 2020-10-04 ENCOUNTER — Other Ambulatory Visit: Payer: Self-pay

## 2020-10-04 ENCOUNTER — Encounter: Payer: Self-pay | Admitting: Cardiology

## 2020-10-04 ENCOUNTER — Ambulatory Visit (INDEPENDENT_AMBULATORY_CARE_PROVIDER_SITE_OTHER): Payer: Medicare HMO | Admitting: Cardiology

## 2020-10-04 ENCOUNTER — Other Ambulatory Visit: Payer: Self-pay | Admitting: Nurse Practitioner

## 2020-10-04 VITALS — BP 122/80 | HR 76 | Ht 63.0 in | Wt 278.0 lb

## 2020-10-04 DIAGNOSIS — I10 Essential (primary) hypertension: Secondary | ICD-10-CM

## 2020-10-04 DIAGNOSIS — E119 Type 2 diabetes mellitus without complications: Secondary | ICD-10-CM | POA: Diagnosis not present

## 2020-10-04 DIAGNOSIS — R9431 Abnormal electrocardiogram [ECG] [EKG]: Secondary | ICD-10-CM | POA: Diagnosis not present

## 2020-10-04 DIAGNOSIS — I471 Supraventricular tachycardia: Secondary | ICD-10-CM

## 2020-10-04 MED ORDER — PINDOLOL 10 MG PO TABS
15.0000 mg | ORAL_TABLET | Freq: Two times a day (BID) | ORAL | 3 refills | Status: DC
Start: 2020-10-04 — End: 2020-11-06

## 2020-10-04 NOTE — Progress Notes (Signed)
Cardiology Office Note:    Date:  10/04/2020   ID:  Harlie, Buening 12-23-1960, MRN [id number]  PCP:  Lurline Del, DO  [facility] Cardiologist:  Candee Furbish, MD  [facility] Electrophysiologist:  None   Referring MD: Lurline Del, DO     History of Present Illness:    Hailey Barnes is a 59 y.o. female here for follow-up of SVT, recent ER visit with prior sinus bradycardia and pause at night of 4.4 seconds. Beta-blocker was discontinued at that time during monitoring. Looks like she is on pindolol 10 mg BID. BP watched by nephrology.   On 09/25/2019 when she was in the emergency department with palpitation shortness of breath. Per EMS initial heart rate was 150 bpm with blood pressure of 200/130. He was treated with adenosine 6 mg with conversion to normal sinus rhythm with rate of 70. Symptoms resolved after treatment by EMS. Rapid heart rate was all day. Came out of no where, woke up like that. Felt sweaty, SOB, elephant on chest. Could not move without fatigue.   The rhythm strips were reviewed by Dr. Vira Agar once in the emergency room and indicated conversion from heart rate of 147 and normal sinus rhythm with slight sinus pause. She denied any recent fevers chills.  Has severe LVH on prior echocardiogram.  Past Medical History:  Diagnosis Date  . Arthritis    "knees" (02/21/2014), back  . CHF (congestive heart failure) ([place])   . Chronic bronchitis ([place])    "get it q yr" (02/21/2014)  . Chronic diastolic CHF (congestive heart failure) ([place])    a. Echo 10/23/2016: EF 75 %  . Chronic lower back pain   . Complication of anesthesia    "I don't come out well; I chew on my tongue"  . COPD (chronic obstructive pulmonary disease) ([place])   . Diabetes mellitus without complication ([place])    TYPE 2  . Family history of adverse reaction to anesthesia    BROTHER PONV, had a blood clot several days later and heart stopped  . Fatty liver    NONALCOLIC  . Fibroid   .  Heart murmur   . Hypertension   . Infection of right eye    current dx on Saturday 7/15 - using drops  . Leg swelling    bilateral  . Migraine 1982-2009  . MVP (mitral valve prolapse)   . Neuromuscular disorder ([place])    right leg nerve pain  . Shortness of breath    WITH EXERTION  . Sinus pause    a. noted on telemetry during admission from 10/2016, lasting up to 4.4 seconds. BB discontinued.   . Sleep apnea 02/2016   CPAP 16 TO 20  . Stroke [facility]) noted on CAT 02/2014   "light", LLE weakness remains (03/30/2014)  . 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 Tingling in extremities 12/12/2010   Uric acid and electrolytes (02/23) normal but WBC elevated.   D/Dx: carpal tunnel, ulnar neuropathy Less likely: cervical radiculopathy, vasculitis     Past Surgical History:  Procedure Laterality Date  . [place]  . CHOLECYSTECTOMY N/A 06/17/2017   Procedure: LAPAROSCOPIC CHOLECYSTECTOMY WITH INTRAOPERATIVE CHOLANGIOGRAM;  Surgeon: Donnie Mesa, MD;  Location: [facility];  Service: General;  Laterality: N/A;  . COLONOSCOPY WITH PROPOFOL N/A 04/28/2017   Procedure: COLONOSCOPY WITH PROPOFOL;  Surgeon: Manus Gunning, MD;  Location: [facility];  Service: Gastroenterology;  Laterality: N/A;  . DILATATION & CURETTAGE/HYSTEROSCOPY WITH MYOSURE N/A 05/06/2016   Procedure: DILATATION & CURETTAGE/HYSTEROSCOPY WITH MYOSURE;  Surgeon: Terrance Mass, MD;  Location: [facility];  Service: Gynecology;  Laterality: N/A;  request to follow around 8:45  requests one hour OR time  . DILATATION & CURETTAGE/HYSTEROSCOPY WITH MYOSURE N/A 04/03/2017   Procedure: DILATATION & CURETTAGE/HYSTEROSCOPY WITH MYOSURE;  Surgeon: Terrance Mass, MD;  Location: [facility];  Service: Gynecology;  Laterality: N/A;  . DILATION AND CURETTAGE OF UTERUS    . ESOPHAGOGASTRODUODENOSCOPY (EGD) WITH PROPOFOL N/A 04/28/2017   Procedure: ESOPHAGOGASTRODUODENOSCOPY  (EGD) WITH PROPOFOL;  Surgeon: Manus Gunning, MD;  Location: [facility];  Service: Gastroenterology;  Laterality: N/A;  . FOOT SURGERY Bilateral    "took bones out; put pins in"  . IR ABLATE LIVER CRYOABLATION  02/08/2020  . IR RADIOLOGIST EVAL & MGMT  02/03/2020  . KNEE ARTHROSCOPY Left 08/16/2018   Procedure: ARTHROSCOPY KNEE PARTIAL MEDIAL MENISECTOMY, CHONDROPLASTY MEDIAL AND LATERAL, PLICA RELEASE MEDIAL;  Surgeon: Dorna Leitz, MD;  Location: [facility];  Service: Orthopedics;  Laterality: Left;  . LEFT AND RIGHT HEART CATHETERIZATION WITH CORONARY ANGIOGRAM N/A 03/31/2014   Procedure: LEFT AND RIGHT HEART CATHETERIZATION WITH CORONARY ANGIOGRAM;  Surgeon: Birdie Riddle, MD;  Location: [facility];  Service: Cardiovascular;  Laterality: N/A;  . MYOMECTOMY  YRS AGO  . sonohystogram  04/14/2016   [facility]: intramural fibroid, premenopausal endometrium, right fluid filled tubular structure    Current Medications: Current Meds  Medication Sig  . acetaminophen (TYLENOL) 325 MG tablet Take 2 tablets (650 mg total) by mouth every 6 (six) hours as needed.  Marland Kitchen albuterol (VENTOLIN HFA) 108 (90 Base) MCG/ACT inhaler Inhale 1-2 puffs into the lungs every 6 (six) hours as needed for wheezing or shortness of breath.  . allopurinol (ZYLOPRIM) 300 MG tablet TAKE 1 TABLET BY MOUTH EVERY DAY  . aspirin EC 81 MG tablet Take 81 mg by mouth daily.  Marland Kitchen atorvastatin (LIPITOR) 40 MG tablet Take 40 mg by mouth once a day  . BREO ELLIPTA 200-25 MCG/INH AEPB TAKE 1 PUFF BY MOUTH EVERY DAY  . empagliflozin (JARDIANCE) 25 MG TABS tablet Take 1 tablet (25 mg total) by mouth daily.  Marland Kitchen glucose blood (ACCU-CHEK AVIVA) test strip Use to check sugar up to 3 times daily  . hydrALAZINE (APRESOLINE) 50 MG tablet Take 1 tablet (50 mg total) by mouth 3 (three) times daily.  Marland Kitchen KLOR-CON M20 20 MEQ tablet TAKE 1 TABLET BY MOUTH EVERY DAY  . Lancets (ACCU-CHEK SOFT TOUCH) lancets Use as instructed   . liraglutide (VICTOZA) 18 MG/3ML SOPN Inject 3.5WS minus 2 clicks under the skin once weekly.  Marland Kitchen lisinopril (ZESTRIL) 40 MG tablet Take 1 tablet (40 mg total) by mouth daily.  . metFORMIN (GLUCOPHAGE) 1000 MG tablet Take 1 tablet (1,000 mg total) by mouth 2 (two) times daily with a meal.  . nitrofurantoin, macrocrystal-monohydrate, (MACROBID) 100 MG capsule Take 1 capsule (100 mg total) by mouth 2 (two) times daily. X 7 days  . spironolactone (ALDACTONE) 25 MG tablet Take 25 mg by mouth daily.  Marland Kitchen tiotropium (SPIRIVA) 18 MCG inhalation capsule Place 1 capsule (18 mcg total) into inhaler and inhale daily.  Marland Kitchen torsemide (DEMADEX) 20 MG tablet TAKE 1 TABLET BY MOUTH EVERY DAY MAY TAKE 1 EXTRA TABLET DAILY ONLY AS NEEDED  . triamcinolone (KENALOG) 0.025 % ointment APPLY 1 APPLICATION TOPICALLY 2 (TWO) TIMES DAILY AS NEEDED.  . [  DISCONTINUED] pindolol (VISKEN) 10 MG tablet Take 2 tablets (20 mg total) by mouth 2 (two) times daily.     Allergies:   Bupropion and Norvasc [amlodipine]   Social History   Socioeconomic History  . Marital status: Divorced    Spouse name: Not on file  . Number of children: 1  . Years of education: 60  . Highest education level: 12th grade  Occupational History  . Occupation: Psychologist, educational    Comment: disabled  . Occupation: patient care assistance  Tobacco Use  . Smoking status: Current Every Day Smoker    Packs/day: 1.00    Years: 42.00    Pack years: 42.00    Types: Cigarettes    Start date: 10/20/1976  . Smokeless tobacco: Never Used  Vaping Use  . Vaping Use: Never used  Substance and Sexual Activity  . Alcohol use: Yes    Comment: very occasional  . Drug use: Yes    Types: "Crack" cocaine, Marijuana    Comment: 03/30/2014 "stopped using crack 02/21/2014"  . Sexual activity: Yes    Birth control/protection: Post-menopausal  Other Topics Concern  . Not on file  Social History Narrative   Divorced since 2015.   Lives alone. No pets. Daughter lives in  [place]. Daughter helps with transportation. Talks daily on phone, sees once or twice a week. 2 grandkids.   Enjoys church at [employer], [place] geared towards people with substance abuse history.    Lives in duplex apartment, one level, three stairs to get in. Has handrails, has grab bars in bathroom. Smoke alarms.   Bakes most meals, avoids starches, rice. Eats vegetables. Drinks tea, ginger ale.    Likes to go to movies, used to walk daily but not now due to knee pain. Likes to spend time with grandchildren, go out to eat. Spend time with family and circle of friends.    Social Determinants of Health   Financial Resource Strain: Not on file  Food Insecurity: Not on file  Transportation Needs: Not on file  Physical Activity: Not on file  Stress: Not on file  Social Connections: Not on file     Family History: The patient's family history includes COPD in her mother and sister; Cancer in her sister; Hypertension in her father, mother, sister, and sister.  ROS:   Please see the history of present illness.     All other systems reviewed and are negative.  EKGs/Labs/Other Studies Reviewed:    The following studies were reviewed today:   Echocardiogram 10/23/2016 -Severe LVH EF 75%, elevated left atrial filling pressures  Cardiac catheterization 03/31/2014: -No significant CAD, EF 70%, LVEDP 19   Prior EKG and strips reviewed from EMS under media.  Looks like SVT 150 broke with adenosine to sinus rhythm.  Recent Labs: 04/09/2020: ALT 21 09/24/2020: BUN 14; Creatinine, Ser 1.20; Hemoglobin 16.6; Platelets 259; Potassium 4.0; Sodium 138  Recent Lipid Panel    Component Value Date/Time   CHOL 137 04/09/2020 1119   TRIG 155 (H) 04/09/2020 1119   HDL 42 04/09/2020 1119   CHOLHDL 3.3 04/09/2020 1119   CHOLHDL 4.1 07/02/2017 0652   VLDL 23 07/02/2017 0652   LDLCALC 68 04/09/2020 1119     Risk Assessment/Calculations:       Physical Exam:    VS:  BP 122/80  (BP Location: Left Arm, Patient Position: Sitting, Cuff Size: Normal)   Pulse 76   Ht 5' 3"  (1.6 m)   Wt 278 lb (126.1  kg)   LMP 04/06/2017   SpO2 93%   BMI 49.25 kg/m     Wt Readings from Last 3 Encounters:  10/04/20 278 lb (126.1 kg)  08/21/20 279 lb 2 oz (126.6 kg)  07/31/20 271 lb (122.9 kg)     GEN:  Well nourished, well developed in no acute distress HEENT: Normal NECK: No JVD; No carotid bruits LYMPHATICS: No lymphadenopathy CARDIAC: RRR, no murmurs, rubs, gallops RESPIRATORY:  Clear to auscultation without rales, wheezing or rhonchi  ABDOMEN: Soft, non-tender, non-distended MUSCULOSKELETAL:  No edema; No deformity  SKIN: Warm and dry NEUROLOGIC:  Alert and oriented x 3 PSYCHIATRIC:  Normal affect   ASSESSMENT:    1. PSVT (paroxysmal supraventricular tachycardia) ([place])   2. Nonspecific abnormal electrocardiogram (ECG) (EKG)   3. Type 2 diabetes mellitus without complication, without long-term current use of insulin (HCC)    PLAN:    In order of problems listed above:  PSVT -I will increase her pindolol from 10 mg twice a day to 15 mg twice a day. -She continues to use her CPAP.,  No alcohol.  No stimulants.  Could have been a reentrant type tachycardia since it broke with adenosine. -Checking echocardiogram since it has been 3 years.  Prior TSH was normal at 3.2  Essential hypertension -Excellent blood pressure control, followed by nephrology as well.  Tobacco use -Continue to encourage cessation  Severe LVH -Likely from uncontrolled hypertension over years.  Diabetes with hyperlipidemia -On atorvastatin 40.  Last LDL 68.  Excellent.  Hemoglobin A1c 6.3  GERD/burning chest pain --TUMS, can try Pepcid.  Remember, no CAD on heart cath in 2015.  Insomnia  can't sleep.  Continue to encourage sleep hygiene.  Wearing sleep mask.  OSA --Continue use.       Medication Adjustments/Labs and Tests Ordered: Current medicines are reviewed at length  with the patient today.  Concerns regarding medicines are outlined above.  Orders Placed This Encounter  Procedures  . ECHOCARDIOGRAM COMPLETE   Meds ordered this encounter  Medications  . pindolol (VISKEN) 10 MG tablet    Sig: Take 1.5 tablets (15 mg total) by mouth 2 (two) times daily.    Dispense:  135 tablet    Refill:  3    Patient Instructions  Medication Instructions:  Please increase Pindolol to 15 mg twice a day. Continue all other medications as listed.  *If you need a refill on your cardiac medications before your next appointment, please call your pharmacy*  Testing/Procedures: Your physician has requested that you have an echocardiogram. Echocardiography is a painless test that uses sound waves to create images of your heart. It provides your doctor with information about the size and shape of your heart and how well your heart's chambers and valves are working. This procedure takes approximately one hour. There are no restrictions for this procedure.  Follow-Up: At [facility], you and your health needs are our priority.  As part of our continuing mission to provide you with exceptional heart care, we have created designated Provider Care Teams.  These Care Teams include your primary Cardiologist (physician) and Advanced Practice Providers (APPs -  Physician Assistants and Nurse Practitioners) who all work together to provide you with the care you need, when you need it.  We recommend signing up for the patient portal called "MyChart".  Sign up information is provided on this After Visit Summary.  MyChart is used to connect with patients for Virtual Visits (Telemedicine).  Patients are able to  view lab/test results, encounter notes, upcoming appointments, etc.  Non-urgent messages can be sent to your provider as well.   To learn more about what you can do with MyChart, go to [email or web address].    Your next appointment:   2 month(s)  The format for your next  appointment:   In Person  Provider:   Candee Furbish, MD  Thank you for choosing [facility]!!        Signed, Candee Furbish, MD  10/04/2020 9:39 AM    [place]

## 2020-10-04 NOTE — Patient Instructions (Signed)
Medication Instructions:  Please increase Pindolol to 15 mg twice a day. Continue all other medications as listed.  *If you need a refill on your cardiac medications before your next appointment, please call your pharmacy*  Testing/Procedures: Your physician has requested that you have an echocardiogram. Echocardiography is a painless test that uses sound waves to create images of your heart. It provides your doctor with information about the size and shape of your heart and how well your heart's chambers and valves are working. This procedure takes approximately one hour. There are no restrictions for this procedure.  Follow-Up: At [facility], you and your health needs are our priority.  As part of our continuing mission to provide you with exceptional heart care, we have created designated Provider Care Teams.  These Care Teams include your primary Cardiologist (physician) and Advanced Practice Providers (APPs -  Physician Assistants and Nurse Practitioners) who all work together to provide you with the care you need, when you need it.  We recommend signing up for the patient portal called "MyChart".  Sign up information is provided on this After Visit Summary.  MyChart is used to connect with patients for Virtual Visits (Telemedicine).  Patients are able to view lab/test results, encounter notes, upcoming appointments, etc.  Non-urgent messages can be sent to your provider as well.   To learn more about what you can do with MyChart, go to [email or web address].    Your next appointment:   2 month(s)  The format for your next appointment:   In Person  Provider:   Candee Furbish, MD  Thank you for choosing [facility]!!

## 2020-10-15 ENCOUNTER — Other Ambulatory Visit (HOSPITAL_COMMUNITY)
Admission: RE | Admit: 2020-10-15 | Discharge: 2020-10-15 | Disposition: A | Discharge status: Home / Self Care | Payer: Medicare HMO | Source: Ambulatory Visit | Attending: Family Medicine | Admitting: Family Medicine

## 2020-10-15 ENCOUNTER — Telehealth: Payer: Self-pay

## 2020-10-15 ENCOUNTER — Other Ambulatory Visit: Payer: Self-pay

## 2020-10-15 ENCOUNTER — Ambulatory Visit (INDEPENDENT_AMBULATORY_CARE_PROVIDER_SITE_OTHER): Payer: Medicare HMO | Admitting: Family Medicine

## 2020-10-15 VITALS — BP 168/102 | HR 76 | Wt 276.8 lb

## 2020-10-15 DIAGNOSIS — N898 Other specified noninflammatory disorders of vagina: Secondary | ICD-10-CM

## 2020-10-15 DIAGNOSIS — M5416 Radiculopathy, lumbar region: Secondary | ICD-10-CM

## 2020-10-15 DIAGNOSIS — R350 Frequency of micturition: Secondary | ICD-10-CM

## 2020-10-15 LAB — POCT URINALYSIS DIP (MANUAL ENTRY)
Bilirubin, UA: NEGATIVE
Blood, UA: NEGATIVE
Glucose, UA: >=1000 mg/dL — AB
Leukocytes, UA: NEGATIVE
Nitrite, UA: NEGATIVE
Protein Ur, POC: NEGATIVE mg/dL
Spec Grav, UA: 1.025 (ref 1.010–1.025)
Urobilinogen, UA: 0.2 E.U./dL
pH, UA: 5.5 (ref 5.0–8.0)

## 2020-10-15 LAB — POCT WET PREP (WET MOUNT)
Clue Cells Wet Prep Whiff POC: POSITIVE
Trichomonas Wet Prep HPF POC: ABSENT

## 2020-10-15 NOTE — Progress Notes (Signed)
    SUBJECTIVE:   CHIEF COMPLAINT / HPI:   Ms. Lietz is a 59 yo who presents with urinary frequency. She states she just wants "everything checked out."  She does endorse burning when she starts to urinate. States when she uses the restroom it takes a while to urinate. Patient also endorses an odor, and well as some mild lower abdominal pain. Also endorses recent unprotected sex and would like to be tested for STIs.   Of note, she was treated for a UTI on 12/6 in the ED with Macrobid and she completed her abx course   PERTINENT  PMH / PSH:  DM2,  Dysuria, vaginal discharge OBJECTIVE:   BP (!) 168/102   Pulse 76   Wt 125.6 kg   LMP 04/06/2017   SpO2 95%   BMI 49.03 kg/m    General: alert, pleasant, NAD Cardiovascular: RRR no murmurs Respiratory: Lungs CTAB. Normal WOB Abdomen: soft, obese, non tender to palpation. No masses  Pelvic: VULVA: normal appearing vulva with no masses, tenderness or lesions, VAGINA: Normal appearing vagina with normal color, no lesions, with minimal clear/white discharge present, CERVIX: difficult to visualize. Minimal clear/white discharge   Chaperone present for pelvic exam. Assisted by Dr. Ardelia Mems   ASSESSMENT/PLAN:   No problem-specific Assessment & Plan notes found for this encounter.   Urinary frequency  Patient presented with urinary frequency and recent unprotected sex. On physical exam it was difficult to visualize cervix. Had assistance of Dr. Ardelia Mems who visualized a dark lesion on cervix. Advised patient to schedule appointment with Gyn clinic to further visualize via colposcopy. Wet prep performed today shows few clue cells consistent with BV. UA negative for infection. G/C negative  - Tried to call patient about results but mailbox full. Will try again and notify patient via [place]. Will send Rx for Flagyl for 7 days  - f/u with Gyn clinic for colposcopy    Shary Key, [place]

## 2020-10-15 NOTE — Telephone Encounter (Signed)
Epidural ordered, please contact [facility] for scheduling.

## 2020-10-15 NOTE — Telephone Encounter (Signed)
Cyndal called and states she is having back pain again. She would like to get another epidural injection.

## 2020-10-15 NOTE — Patient Instructions (Signed)
It was great seeing you today! Today we checked your urine and cervix for infection. I will call you if there are any abnormal results.  We have scheduled you for a colposcopy on 1/6 at 9:20am to check the spot on your cervix, but if you need to be seen earlier than that for any new issues we're happy to fit you in, just give [place] a call!  Feel free to call with any questions or concerns at any time, at [phone].   Take care,  Dr. Shary Key [place] [facility]    Colposcopy Colposcopy is a procedure to examine the lowest part of the uterus (cervix), the vagina, and the area around the vaginal opening (vulva) for abnormalities or signs of disease. The procedure is done using a lighted microscope or magnifying lens (colposcope). If any unusual cells are found during the procedure, your health care provider may remove a tissue sample for testing (biopsy). A colposcopy may be done if you:  Have an abnormal Pap test. A Pap test is a screening test that is used to check for signs of cancer or infection of the vagina, cervix, and uterus.  Have a Pap smear test in which you test positive for high-risk HPV (human papillomavirus).  Have a sore or lesion on your cervix.  Have genital warts on your vulva, vagina, or cervix.  Took certain medicines while pregnant, such as diethylstilbestrol (DES).  Have pain during sexual intercourse.  Have vaginal bleeding, especially after sexual intercourse.  Need to have a cervical polyp removed.  Need to have a lost intrauterine device (IUD) string located. Let your health care provider know about:  Any allergies you have, including allergies to prescribed medicine, latex, or iodine.  All medicines you are taking, including vitamins, herbs, eye drops, creams, and over-the-counter medicines. Bring a list of all of your medicines to your appointment.  Any problems you or family members have had with anesthetic medicines.  Any  blood disorders you have.  Any surgeries you have had.  Any medical conditions you have, such as pelvic inflammatory disease (PID) or endometrial disorder.  Any history of frequent fainting.  Your menstrual cycle and what form of birth control (contraception) you use.  Your medical history, including any prior cervical treatment.  Whether you are pregnant or may be pregnant. What are the risks? Generally, this is a safe procedure. However, problems may occur, including:  Pain.  Infection, which may include a fever, bad-smelling discharge, or pelvic pain.  Bleeding or discharge.  Misdiagnosis.  Fainting and vasovagal reactions, but this is rare.  Allergic reactions to medicines.  Damage to other structures or organs. What happens before the procedure?  If you have your menstrual period or will have it at the time of your procedure, tell your health care provider. A colposcopy typically is not done during menstruation.  Continue your contraceptive practices before and after the procedure.  For 24 hours before the colposcopy: ? Do not douche. ? Do not use tampons. ? Do not use medicines, creams, or suppositories in the vagina. ? Do not have sexual intercourse.  Ask your health care provider about: ? Changing or stopping your regular medicines. This is especially important if you are taking diabetes medicines or blood thinners. ? Taking medicines such as aspirin and ibuprofen. These medicines can thin your blood. Do not take these medicines before your procedure if your health care provider instructs you not to. It is likely that your health  care provider will tell you to avoid taking aspirin or medicine that contains aspirin for 7 days before the procedure.  Follow instructions from your health care provider about eating or drinking restrictions. You will likely need to eat a regular diet the day of the procedure and not skip any meals.  You may have an exam or testing. A  pregnancy test will be taken on the day of the procedure.  You may have a blood or urine sample taken.  Plan to have someone take you home from the hospital or clinic.  If you will be going home right after the procedure, plan to have someone with you for 24 hours. What happens during the procedure?  You will lie down on your back, with your feet in foot rests (stirrups).  A warmed and lubricated instrument (speculum) will be inserted into your vagina. The speculum will be used to hold apart the walls of your vagina so your health care provider can see your cervix and the inside of your vagina.  A cotton swab will be used to place a small amount of liquid solution on the areas to be examined. This solution makes it easier to see abnormal cells. You may feel a slight burning during this part.  The colposcope will be used to scan the cervix with a bright white light. The colposcope will be held near your vulvaand will magnify your vulva, vagina, and cervix for easier examination.  Your health care provider may decide to take a biopsy. If so: ? You may be given medicine to numb the area (local anesthetic). ? Surgical instruments will be used to suck out mucus and cells through your vagina. ? You may feel mild pain while the tissue sample is removed. ? Bleeding may occur. A solution may be used to stop the bleeding. ? If a sample of tissue is needed from the inside of the cervix, a different procedure called endocervical curettage (ECC) may be completed. During this procedure, a curved instrument (curette) will be used to scrape cells from your cervix or the top of your cervix (endocervix).  Your health care provider will record the location of any abnormalities. The procedure may vary among health care providers and hospitals. What happens after the procedure?  You will lie down and rest for a few minutes. You may be offered juice or cookies.  Your blood pressure, heart rate, breathing  rate, and blood oxygen level will be monitored until any medicines you were given have worn off.  You may have to wear compression stockings. These stockings help to prevent blood clots and reduce swelling in your legs.  You may have some cramping in your abdomen. This should go away after a few minutes. This information is not intended to replace advice given to you by your health care provider. Make sure you discuss any questions you have with your health care provider. Document Revised: 09/18/2017 Document Reviewed: 05/12/2016 Elsevier Patient Education  [place].

## 2020-10-16 LAB — CERVICOVAGINAL ANCILLARY ONLY
Chlamydia: NEGATIVE
Comment: NEGATIVE
Comment: NORMAL
Neisseria Gonorrhea: NEGATIVE

## 2020-10-16 NOTE — Telephone Encounter (Signed)
[facility] advised.

## 2020-10-18 ENCOUNTER — Ambulatory Visit
Admission: RE | Admit: 2020-10-18 | Discharge: 2020-10-18 | Disposition: A | Discharge status: Home / Self Care | Payer: Medicare HMO | Source: Ambulatory Visit | Attending: Sports Medicine | Admitting: Sports Medicine

## 2020-10-18 DIAGNOSIS — M5416 Radiculopathy, lumbar region: Secondary | ICD-10-CM

## 2020-10-18 DIAGNOSIS — M5116 Intervertebral disc disorders with radiculopathy, lumbar region: Secondary | ICD-10-CM | POA: Diagnosis not present

## 2020-10-18 DIAGNOSIS — M47816 Spondylosis without myelopathy or radiculopathy, lumbar region: Secondary | ICD-10-CM | POA: Diagnosis not present

## 2020-10-18 MED ORDER — METHYLPREDNISOLONE ACETATE 40 MG/ML INJ SUSP (RADIOLOG
120.0000 mg | Freq: Once | INTRAMUSCULAR | Status: AC
  Administered 2020-10-18: 13:00:00 120 mg via EPIDURAL

## 2020-10-18 MED ORDER — IOPAMIDOL (ISOVUE-M 200) INJECTION 41%
1.0000 mL | Freq: Once | INTRAMUSCULAR | Status: AC
  Administered 2020-10-18: 13:00:00 1 mL via EPIDURAL

## 2020-10-18 NOTE — Discharge Instructions (Signed)

## 2020-10-23 ENCOUNTER — Other Ambulatory Visit: Payer: Self-pay | Admitting: Family Medicine

## 2020-10-23 MED ORDER — METRONIDAZOLE 500 MG PO TABS: 500.0000 mg | ORAL_TABLET | Freq: Two times a day (BID) | ORAL | 0 refills | Status: AC

## 2020-10-23 MED ORDER — FLUCONAZOLE 150 MG PO TABS: 150.0000 mg | ORAL_TABLET | Freq: Once | ORAL | 0 refills | Status: AC

## 2020-10-24 ENCOUNTER — Other Ambulatory Visit: Payer: Medicare HMO

## 2020-10-25 ENCOUNTER — Other Ambulatory Visit: Payer: Self-pay | Admitting: Family Medicine

## 2020-10-25 ENCOUNTER — Other Ambulatory Visit: Payer: Self-pay

## 2020-10-25 ENCOUNTER — Other Ambulatory Visit (HOSPITAL_COMMUNITY)
Admission: RE | Admit: 2020-10-25 | Discharge: 2020-10-25 | Disposition: A | Discharge status: Home / Self Care | Payer: Medicare HMO | Source: Ambulatory Visit | Attending: Family Medicine | Admitting: Family Medicine

## 2020-10-25 ENCOUNTER — Ambulatory Visit (INDEPENDENT_AMBULATORY_CARE_PROVIDER_SITE_OTHER): Payer: Medicare HMO | Admitting: Family Medicine

## 2020-10-25 VITALS — BP 128/84 | HR 77 | Ht 62.0 in | Wt 280.4 lb

## 2020-10-25 DIAGNOSIS — Z124 Encounter for screening for malignant neoplasm of cervix: Secondary | ICD-10-CM | POA: Diagnosis not present

## 2020-10-25 DIAGNOSIS — Z1151 Encounter for screening for human papillomavirus (HPV): Secondary | ICD-10-CM | POA: Insufficient documentation

## 2020-10-25 DIAGNOSIS — N889 Noninflammatory disorder of cervix uteri, unspecified: Secondary | ICD-10-CM | POA: Insufficient documentation

## 2020-10-25 DIAGNOSIS — N888 Other specified noninflammatory disorders of cervix uteri: Secondary | ICD-10-CM | POA: Diagnosis not present

## 2020-10-25 MED ORDER — IBUPROFEN 200 MG PO TABS: 600.0000 mg | ORAL_TABLET | Freq: Once | ORAL | Status: DC

## 2020-10-25 MED ORDER — IBUPROFEN 200 MG PO TABS
400.0000 mg | ORAL_TABLET | Freq: Once | ORAL | Status: AC
  Administered 2020-10-25: 400 mg via ORAL

## 2020-10-25 NOTE — Assessment & Plan Note (Signed)
Colposcopy performed today.  3 separate biopsies collected at today's visit.  Pap smear was also collected.  Will call patient with results when they return. -Strict return precautions given -Pain control with 400 mg ibuprofen

## 2020-10-25 NOTE — Progress Notes (Addendum)
    SUBJECTIVE:   CHIEF COMPLAINT / HPI:   Concern for cervical changes Patient is a 60 year old female here for colposcopy clinic out of concern for cervical changes.  Most recent Pap smear was completed 02/18/2018 and was negative for any intraepithelial lesions or malignancy.  Patient had issue with vaginal itching on 07/26/2019 and was evaluated.  There was visible changes in the cervix which were concerning and the patient was referred for this clinic.  Patient denies any vaginal bleeding but reports that she has had occasional pelvic pain.  OBJECTIVE:   BP 128/84   Pulse 77   Ht 5' 2"  (1.575 m)   Wt 280 lb 6.4 oz (127.2 kg)   LMP 04/06/2017   SpO2 95%   BMI 51.29 kg/m   General: Well-appearing 60 year old female GU: Unsatisfactory transition zone, decreased iodine uptake in the 12 o'clock position, 6 o'clock position, 9 o'clock position.  Colposcopy   Preoperative diagnosis: Cervical changes Postoperative diagnosis: Cervical changes Procedure: Colposcopy of cervix Preprocedure counseling: The risks, benefits, and alternatives of the procedure were discussed with the patient.   Anesthesia: None  Procedure: Consent and a timeout were performed prior to starting the procedure. .  The patient was placed in dorsal lithotomy position.  The perineum, vulva and vagina were normal.  Speculum introduced and cervix identified.  The colposcope was advanced.  Acetic acid applied x 2 to the cervix.    Lesions identified: 1.  At the 12 o'clock position, 1 o'clock position, 6 o'clock position, 9 o'clock position the lesion at the 1 o'clock position was considerably darker.  Iodine applied with decreased uptake at these locations.  A biopsy was collected at the 12 o'clock position, 6 o'clock position, and the darkened area at the 1 o'clock position was also biopsied.  Monsel was applied for hemostasis was achieved.  Patient reported pain after the procedure but otherwise tolerated well.      Gifford Shave, MD  PGY 2 FM Resident [facility]      ASSESSMENT/PLAN:   Lesion of cervix Colposcopy performed today.  3 separate biopsies collected at today's visit.  Pap smear was also collected.  Will call patient with results when they return. -Strict return precautions given -Pain control with 400 mg ibuprofen     Gifford Shave, MD [place]

## 2020-10-25 NOTE — Patient Instructions (Addendum)
It was wonderful to meet you today. Thank you for allowing me to be a part of your care. Below is a short summary of what we discussed at your visit today:  Pelvic exam Today we performed a PAP smear. During this exam, we visualized your cervix and it looked normal. We did not perform a colposcopy today. If the PAP smear results come back abnormal, we will proceed with a colposcopy at a later time.   Vaginal Pain Today you told us about sharp vaginal pains. Please make an appointment to see your PCP regarding this.   Smoking Your extensive smoking history puts you at higher risk of cancers, including cervical cancer.    If you have any questions or concerns, please do not hesitate to contact us via phone or MyChart message.   Ezequiel Essex, MD   Colposcopy, Care After This sheet gives you information about how to care for yourself after your procedure. Your doctor may also give you more specific instructions. If you have problems or questions, contact your doctor. What can I expect after the procedure? If you did not have a tissue sample removed (did not have a biopsy), you may only have some spotting for a few days. You can go back to your normal activities. If you had a tissue sample removed, it is common to have:  Soreness and pain. This may last for a few days.  Light-headedness.  Mild bleeding from your vagina or dark-colored, grainy discharge from your vagina. This may last for a few days. You may need to wear a sanitary pad.  Spotting for at least 48 hours after the procedure. Follow these instructions at home:   Take over-the-counter and prescription medicines only as told by your doctor. Ask your doctor what medicines you can start taking again. This is very important if you take blood-thinning medicine.  Do not drive or use heavy machinery while taking prescription pain medicine.  For 3 days, or as long as your doctor tells you, avoid: ? Douching. ? Using  tampons. ? Having sex.  If you use birth control (contraception), keep using it.  Limit activity for the first day after the procedure. Ask your doctor what activities are safe for you.  It is up to you to get the results of your procedure. Ask your doctor when your results will be ready.  Keep all follow-up visits as told by your doctor. This is important. Contact a doctor if:  You get a skin rash. Get help right away if:  You are bleeding a lot from your vagina. It is a lot of bleeding if you are using more than one pad an hour for 2 hours in a row.  You have clumps of blood (blood clots) coming from your vagina.  You have a fever.  You have chills  You have pain in your lower belly (pelvic area).  You have signs of infection, such as vaginal discharge that is: ? Different than usual. ? Yellow. ? Bad-smelling.  You have very pain or cramps in your lower belly that do not get better with medicine.  You feel light-headed.  You feel dizzy.  You pass out (faint). Summary  If you did not have a tissue sample removed (did not have a biopsy), you may only have some spotting for a few days. You can go back to your normal activities.  If you had a tissue sample removed, it is common to have mild pain and spotting for 48 hours.  For 3 days, or as long as your doctor tells you, avoid douching, using tampons and having sex.  Get help right away if you have bleeding, very bad pain, or signs of infection. This information is not intended to replace advice given to you by your health care provider. Make sure you discuss any questions you have with your health care provider. Document Revised: 09/18/2017 Document Reviewed: 06/25/2016 Elsevier Patient Education  [place].

## 2020-10-31 ENCOUNTER — Telehealth: Payer: Self-pay | Admitting: Family Medicine

## 2020-10-31 LAB — CYTOLOGY - PAP
Comment: NEGATIVE
Diagnosis: NEGATIVE
High risk HPV: NEGATIVE

## 2020-10-31 NOTE — Telephone Encounter (Signed)
Patient is calling wanting to know about her test results for pap on the 6th. If someone could call and let her know what is going on she would appreciated it. Thanks

## 2020-10-31 NOTE — Telephone Encounter (Signed)
Contacted Cytology and it appears that they do have the sample and I asked if the lady if she would ask them to post results so that the doctor could relay them to the patient. Contacted pt and informed her that I did reach out to them and requested they post results ASAP so the doctor could relay them to her. Hailey Barnes, CMA

## 2020-10-31 NOTE — Telephone Encounter (Signed)
I called pathology lab and the pathologist stated that some of the lab request was sent out because they are back logged. I informed the patient and I will contact her as soon as I have a result.

## 2020-11-01 ENCOUNTER — Other Ambulatory Visit: Payer: Self-pay

## 2020-11-01 ENCOUNTER — Other Ambulatory Visit: Payer: Self-pay | Admitting: Family Medicine

## 2020-11-01 ENCOUNTER — Telehealth: Payer: Self-pay | Admitting: Family Medicine

## 2020-11-01 ENCOUNTER — Encounter: Payer: Self-pay | Admitting: Family Medicine

## 2020-11-01 ENCOUNTER — Ambulatory Visit (HOSPITAL_COMMUNITY): Payer: Medicare HMO | Attending: Cardiology

## 2020-11-01 DIAGNOSIS — R9431 Abnormal electrocardiogram [ECG] [EKG]: Secondary | ICD-10-CM | POA: Insufficient documentation

## 2020-11-01 LAB — ECHOCARDIOGRAM COMPLETE
Area-P 1/2: 3.54 cm2
MV VTI: 1.37 cm2
S' Lateral: 2.4 cm

## 2020-11-01 NOTE — Patient Instructions (Signed)
Mitral Valve Regurgitation  Mitral valve regurgitation, also called mitral regurgitation, is a condition in which some blood leaks back (regurgitates) through the mitral valve in the heart. The mitral valve is located between the upper left chamber (left atrium) and the lower left chamber (left ventricle) of the heart. When blood travels through the heart, it goes from the left atrium to the left ventricle and then out to the body. Normally, the mitral valve opens when the atrium pumps blood into the ventricle, and it closes when the ventricle pumps blood out to the body. Mitral valve regurgitation happens when the mitral valve does not close properly. As a result, blood in the ventricle leaks back into the atrium. Mitral valve regurgitation causes the heart to work harder to pump blood. If the condition is mild, a person may not have symptoms. However, over time, this can lead to heart failure. What are the causes? This condition may be caused by:  A condition in which the mitral valve does not close completely when the heart pumps blood (mitral valve prolapse).  Heart valve infection (endocarditis).  Certain types of heart disease.  A condition that causes inflammation of the heart, blood vessels, or joints (rheumatic fever).  Certain conditions that are present at birth (congenital heart defect).  Previous radiation therapy to the chest area.  Damage to the mitral valve, such as from injury (trauma) to the heart or a heart attack.  Certain combinations of weight-loss (anti-obesity) medicines. What are the signs or symptoms? In some cases of mild to moderate mitral regurgitation, there are no symptoms. Symptoms of this condition include:  Shortness of breath.  Fatigue.  Activity intolerance.  Cough. Severe symptoms of this condition include:  Suddenly waking up at night with difficulty breathing.  Fast or irregular heartbeat.  Swelling in the lower legs, ankles, and  feet.  Fluid in the lungs that makes it very hard to breathe (pulmonary edema). How is this diagnosed? This condition may be diagnosed based on:  A physical exam. Your health care provider will listen to your heart for an abnormal heart sound (murmur).  Tests to confirm the diagnosis. These may include: ? A test that creates ultrasound images of the heart (echocardiogram). This test allows your health care provider to see how the heart valves work while your heart is beating. ? Chest X-ray. ? A test that records the electrical impulses of the heart (electrocardiogram, or ECG). ? A test that looks at the structure and function of the heart (cardiac catheterization). How is this treated? This condition may be treated with:  Medicines. These may be given to treat symptoms and prevent complications.  Surgery or catheter-based procedures to repair or replace the mitral valve. This may be done in severe, long-term (chronic) cases. Follow these instructions at home: Eating and drinking  Eat a heart-healthy diet that includes whole grains, fresh fruits and vegetables, low-fat (lean) proteins, and low-fat or nonfat dairy products. Consider working with a dietitian to help you make healthy food choices.  Limit how much salt (sodium) you eat as told by your health care provider. Follow instructions from your health care provider about any other eating or drinking restrictions, such as limiting foods that are high in fat and processed sugars.  Use healthy cooking methods, such as roasting, grilling, broiling, baking, poaching, steaming, or stir-frying.   Alcohol use  Do not drink alcohol if: ? Your health care provider tells you not to drink. ? You are pregnant, may be  pregnant, or are planning to become pregnant.  If you drink alcohol: ? Limit how much you use to:  0-1 drink a day for women.  0-2 drinks a day for men. ? Be aware of how much alcohol is in your drink. In the [place] one drink  equals one 12 oz bottle of beer (355 mL), one 5 oz glass of wine (148 mL), or one 1 oz glass of hard liquor (44 mL). Activity  Return to your normal activities as told by your health care provider. Ask your health care provider what activities are safe for you.  Regular exercise is important for the health of your heart and for maintaining a healthy weight. Ask your health care provider what type of exercise is safe for you. You may need to avoid strenuous exercise. Lifestyle  Maintain a healthy weight.  Do not use any products that contain nicotine or tobacco, such as cigarettes, e-cigarettes, and chewing tobacco. If you need help quitting, ask your health care provider.  Find ways to manage stress. If you need help with this, ask your health care provider. General instructions  Take over-the-counter and prescription medicines only as told by your health care provider.  Work closely with your health care provider to manage any other health conditions you have, such as diabetes or high blood pressure.  If you plan to become pregnant, talk with your health care provider first.  Keep all follow-up visits as told by your health care provider. This is important. Contact a health care provider if you:  Have a fever.  Feel more tired than usual when doing physical activity.  Have a dry cough. Get help right away if you:  Have shortness of breath.  Develop chest pain.  Have swelling in your hands, feet, ankles, or abdomen that is getting worse.  Have trouble staying awake or you faint.  Feel dizzy or unsteady.  Suddenly gain weight.  Feel confused. These symptoms may represent a serious problem that is an emergency. Do not wait to see if the symptoms will go away. Get medical help right away. Call your local emergency services (911 in the [place] Do not drive yourself to the hospital. Summary  Mitral valve regurgitation, also called mitral regurgitation, is a condition in  which some blood leaks back (regurgitates) through the mitral valve in the heart.  Depending on how severe your condition is, you may be treated with medicines, catheter-based procedures, or surgery.  Practice heart-healthy habits to manage this condition. These include limiting alcohol, avoiding nicotine and tobacco, and eating a heart-healthy diet that is low in salt (sodium). This information is not intended to replace advice given to you by your health care provider. Make sure you discuss any questions you have with your health care provider. Document Revised: 09/19/2018 Document Reviewed: 09/19/2018 Elsevier Patient Education  2021 [employer].

## 2020-11-01 NOTE — Telephone Encounter (Signed)
Pathology report discussed with the patient.   I explained what a mucosal melanosis is and the risk of transition to cancer.  Note that only the 1 O'clock biopsy tissue was reported on the pathology. The other areas were not reported. As discussed with the patient, I will contact pathology tomorrow to clarify. I will only call her back if there is any concerning report. She agreed with the plan.

## 2020-11-02 NOTE — Telephone Encounter (Signed)
I also left a message for Dr. Mark Martinique with [facility]. She stated they will get the report corrected today.

## 2020-11-02 NOTE — Telephone Encounter (Signed)
I contacted pathology to confirm that all specimen sent in for evaluation were negative for malignant cells since they only reported the 1 o'clock specimen and not the 6 and 12 o'clock specimen.  Dr. Saralyn Pilar was out of the office but I discussed with Dr. Ernst Bowler. She will have someone review the slides again and update the report.

## 2020-11-06 ENCOUNTER — Other Ambulatory Visit: Payer: Self-pay | Admitting: Family Medicine

## 2020-11-07 ENCOUNTER — Encounter: Payer: Self-pay | Admitting: Family Medicine

## 2020-11-07 DIAGNOSIS — L814 Other melanin hyperpigmentation: Secondary | ICD-10-CM | POA: Insufficient documentation

## 2020-11-08 ENCOUNTER — Other Ambulatory Visit: Payer: Self-pay | Admitting: *Deleted

## 2020-11-08 DIAGNOSIS — I340 Nonrheumatic mitral (valve) insufficiency: Secondary | ICD-10-CM

## 2020-11-08 NOTE — Progress Notes (Signed)
[name], MD  11/01/2020 2:46 PM EST      Normal pump function, hyperdynamic function. Moderate mitral valve regurgitation.  Given mitral valve regurgitation, please repeat echocardiogram in 1 year.  Candee Furbish, MD

## 2020-11-14 ENCOUNTER — Other Ambulatory Visit: Payer: Self-pay | Admitting: Family Medicine

## 2020-11-21 ENCOUNTER — Other Ambulatory Visit: Payer: Self-pay | Admitting: Cardiology

## 2020-12-03 ENCOUNTER — Ambulatory Visit: Payer: Self-pay

## 2020-12-03 ENCOUNTER — Ambulatory Visit: Payer: Medicare HMO | Admitting: Family Medicine

## 2020-12-03 ENCOUNTER — Other Ambulatory Visit: Payer: Self-pay

## 2020-12-03 VITALS — BP 142/100 | HR 82 | Ht 62.0 in | Wt 274.2 lb

## 2020-12-03 DIAGNOSIS — M25531 Pain in right wrist: Secondary | ICD-10-CM

## 2020-12-03 DIAGNOSIS — M25512 Pain in left shoulder: Secondary | ICD-10-CM | POA: Diagnosis not present

## 2020-12-03 DIAGNOSIS — M25532 Pain in left wrist: Secondary | ICD-10-CM | POA: Diagnosis not present

## 2020-12-03 DIAGNOSIS — M542 Cervicalgia: Secondary | ICD-10-CM | POA: Diagnosis not present

## 2020-12-03 DIAGNOSIS — G5603 Carpal tunnel syndrome, bilateral upper limbs: Secondary | ICD-10-CM | POA: Diagnosis not present

## 2020-12-03 NOTE — Progress Notes (Signed)
Subjective:    CC: B hand and finger pain  I, Hailey Barnes, LAT, ATC, am serving as scribe for Dr. Lynne Leader.  HPI: Pt is a 60 y/o female c/o bilat wrist, hand, and finger pain x 1 1/2 weeks w/ recent worsening pain, R>L. Pt locates pain to over carpals, R 1st CMC and MCP joint and numbness in 1-3, and partial numbness in 4th. 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 Aundria Mems  Additionally she notes some left lateral neck pain and left shoulder pain.  She thinks these are unrelated to her hand issues.  She would like these to be addressed at a later date if possible.  Neck pain: Left lateral Numbness/tingling: yes Aggravating factors: any fine motor activity- using a can opener Treatments tried: steroid injections- Dr. Dianah Field in [place]  Pertinent review of Systems: No fevers or chills  Relevant historical information: Diabetes   Objective:    Vitals:   12/03/20 1046  BP: (!) 142/100  Pulse: 82  SpO2: 95%   General: Well Developed, well nourished, and in no acute distress.   MSK: C-spine normal-appearing Nontender midline. Tender palpation left cervical paraspinal musculature. Decreased cervical motion.  Left shoulder normal. Not particularly tender palpation. Decreased shoulder motion to abduction and internal rotation. Strength is intact abduction external and internal rotation.  Right hand and wrist normal-appearing Positive Tinel's at carpal tunnel.  Positive Phalen's test. Intact strength.  Left hand swollen at first [facility]. Tender palpation first [place]. Mildly positive Tinel's carpal tunnel.  Lab and Radiology Results   Procedure: Real-time Ultrasound Guided hydrodissection of median nerve at right carpal tunnel Device: Philips Affiniti 50G Images permanently stored and available for review in PACS Verbal informed consent obtained.  Discussed risks and benefits of procedure.  Warned about infection bleeding damage to structures skin hypopigmentation and fat atrophy among others. Patient expresses understanding and agreement Time-out conducted.   Noted no overlying erythema, induration, or other signs of local infection.   Skin prepped in a sterile fashion.   Local anesthesia: Topical Ethyl chloride.   With sterile technique and under real time ultrasound guidance:  40 mg of Kenalog and 1 mL of lidocaine injected into carpal tunnel around median nerve. Fluid seen entering the carpal tunnel.   Completed without difficulty   Pain immediately resolved suggesting accurate placement of the medication.   Advised to call if fevers/chills, erythema, induration, drainage, or persistent bleeding.   Images permanently stored and available for review in the ultrasound unit.  Impression: Technically successful ultrasound guided injection.     Procedure: Real-time Ultrasound Guided Injection of left first [facility] Device: Philips Affiniti 50G Images permanently stored and available for review in PACS Verbal informed consent obtained.  Discussed risks and benefits of procedure. Warned about infection bleeding damage to structures skin hypopigmentation and fat atrophy among others. Patient expresses understanding and agreement Time-out conducted.   Noted no overlying erythema, induration, or other signs of local infection.   Skin prepped in a sterile fashion.   Local anesthesia: Topical Ethyl chloride.   With sterile technique and under real time ultrasound guidance:  30 mg of Kenalog and 0.75 mL of lidocaine injected into the first [place]. Fluid seen entering the [facility].   Completed without difficulty   Pain immediately resolved suggesting accurate placement of the medication.   Advised to call if fevers/chills, erythema, induration, drainage, or persistent bleeding.   Images  permanently stored and available for review in the ultrasound unit.  Impression: Technically successful  ultrasound guided injection.     Impression and Recommendations:    Assessment and Plan: 60 y.o. female with right carpal tunnel syndrome.  This is a recurrent issue.  Previously she is done pretty well with steroid injections.  Plan for repeat injection/Hydro dissection.  Recheck back in 1 week to reassess and proceed with other issues.  If not improving in the more long-term would consider nerve conduction study to plan for surgery.  Left thumb pain.  Patient has DJD at the first [facility].  Plan for injection of this issue today.  Again reassess in 1 week.  Left lateral neck and shoulder pain.  Unrelated to today's issue.  Patient has left trapezius and paraspinal muscle dysfunction as well as what appears to be rotator cuff shoulder pain.  Plan for referral to physical therapy and reassess in 1 week to spend more time on this issue.Marland Kitchen  PDMP not reviewed this encounter. Orders Placed This Encounter  Procedures  . [place] LIMITED JOINT SPACE STRUCTURES UP BILAT(NO LINKED CHARGES)    Standing Status:   Future    Number of Occurrences:   1    Standing Expiration Date:   06/02/2021    Order Specific Question:   Reason for Exam (SYMPTOM  OR DIAGNOSIS REQUIRED)    Answer:   bilateral wrist pain    Order Specific Question:   Preferred imaging location?    Answer:   [place]  . Ambulatory referral to Physical Therapy    Referral Priority:   Routine    Referral Type:   Physical Medicine    Referral Reason:   Specialty Services Required    Requested Specialty:   Physical Therapy   No orders of the defined types were placed in this encounter.   Discussed warning signs or symptoms. Please see discharge instructions. Patient expresses understanding.   The above documentation has been reviewed and is accurate and complete Lynne Leader, M.D.

## 2020-12-03 NOTE — Patient Instructions (Signed)
Thank you for coming in today.  I've referred you to Physical Therapy.  Let us know if you don't hear from them in one week.  Recheck with me in about 1 week.   Let me know if this is not helping.   Call or go to the ER if you develop a large red swollen joint with extreme pain or oozing puss.

## 2020-12-07 NOTE — Progress Notes (Signed)
   I, [name], LAT, ATC, am serving as scribe for Dr. Lynne Leader.  Hailey Barnes is a 60 y.o. female who presents to [facility] today for B hand and finger pain.  She was last seen by Dr. Georgina Barnes on 12/03/20 and had a R wrist and L thumb injection.  She also noted L lateral neck and shoulder pain at her last visit which weren't fully assessed.  She was referred to PT for her neck and shoulder.  Since her last visit, pt reports numbness in R hand and fingers 1-3. Pt reports L wrist and thumb are painful, but no numbness/tingling. Pt reports no weakness in grip strength.  Additionally her left shoulder is feeling better.  She notes a little bit of pain but improvement following the hand and wrist injections last week.  Physical therapy ordered last week is scheduled to start next week.  Diagnostic imaging:CT soft tissue neck- 04/17/20; Neck XR- 04/10/20;   Pertinent review of systems: No fevers or chills  Relevant historical information: No fevers or chills   Exam:  BP (!) 148/98 (BP Location: Left Arm, Patient Position: Sitting, Cuff Size: Normal)   Pulse 77   Ht 5' 2"  (1.575 m)   Wt 266 lb 6.4 oz (120.8 kg)   LMP 04/06/2017   SpO2 97%   BMI 48.73 kg/m  General: Well Developed, well nourished, and in no acute distress.   MSK: Right wrist normal-appearing Mildly positive Tinel's carpal tunnel.  Decreased sensation light touch median nerve distribution.  Grip strength is intact.  Left hand normal. Mildly tender palpation first [place].  Left shoulder normal-appearing nontender normal motion pain with abduction.  Positive Hawkins and Neer's test mildly. Intact strength.     Assessment and Plan: 60 y.o. female with  Right carpal tunnel syndrome: Improved following injection but not fully resolved.  Patient still has some numbness.  Plan for nerve conduction study to further characterize severity of disease and for surgical planning.  Recheck back  in about 6 6 weeks.  Left thumb pain due to DJD.  Repeat steroid injections as needed ultimately will require reconstruction.  Left shoulder pain thought to be subacromial bursitis.  PT pending.  Improved following systemic steroids.  Recheck 6 weeks.   PDMP not reviewed this encounter. Orders Placed This Encounter  Procedures  . Ambulatory referral to Neurology    Referral Priority:   Routine    Referral Type:   Consultation    Referral Reason:   Specialty Services Required    Requested Specialty:   Neurology    Number of Visits Requested:   1  . NCV with EMG(electromyography)    Standing Status:   Future    Standing Expiration Date:   12/10/2021    Order Specific Question:   Where should this test be performed?    Answer:   [facility]   No orders of the defined types were placed in this encounter.    Discussed warning signs or symptoms. Please see discharge instructions. Patient expresses understanding.   The above documentation has been reviewed and is accurate and complete Lynne Leader, M.D.

## 2020-12-10 ENCOUNTER — Telehealth: Payer: Medicare HMO

## 2020-12-10 ENCOUNTER — Ambulatory Visit (INDEPENDENT_AMBULATORY_CARE_PROVIDER_SITE_OTHER): Payer: Medicare HMO | Admitting: Family Medicine

## 2020-12-10 ENCOUNTER — Other Ambulatory Visit: Payer: Self-pay

## 2020-12-10 VITALS — BP 148/98 | HR 77 | Ht 62.0 in | Wt 266.4 lb

## 2020-12-10 DIAGNOSIS — G5603 Carpal tunnel syndrome, bilateral upper limbs: Secondary | ICD-10-CM | POA: Diagnosis not present

## 2020-12-10 DIAGNOSIS — M25512 Pain in left shoulder: Secondary | ICD-10-CM

## 2020-12-10 DIAGNOSIS — M79645 Pain in left finger(s): Secondary | ICD-10-CM | POA: Diagnosis not present

## 2020-12-10 DIAGNOSIS — G8929 Other chronic pain: Secondary | ICD-10-CM

## 2020-12-10 NOTE — Patient Instructions (Addendum)
Follow up with PT.  For the shoulder  You should hear about Neurology and nerve study for carpal tunnel.   We can do the thumb injection every 3 months as needed.  Eventually it will wear out enough that surgery makes sense.   Recheck in 6 weeks.    If things go [place] before then I can get you back sooner.

## 2020-12-11 ENCOUNTER — Telehealth: Payer: Self-pay

## 2020-12-11 NOTE — Telephone Encounter (Signed)
  Care Management   Outreach Note  12/11/2020 Name: Hailey Barnes MRN: [id number] DOB: 1961/01/18  Referred by: Lurline Del, DO Reason for referral : Chronic Care Management (HTN)   An unsuccessful telephone outreach was attempted today. The patient was referred to the case management team for assistance with care management and care coordination.   Follow Up Plan: A HIPAA compliant phone message was left for the patient providing contact information and requesting a return call.  The care management team will reach out to the patient again over the next 7-10 days.   Lazaro Arms RN, BSN, [facility] Care Management Coordinator [place] Phone: [phone] I Fax: [phone]

## 2020-12-13 ENCOUNTER — Ambulatory Visit: Payer: Medicare HMO | Admitting: Rehabilitative and Restorative Service Providers"

## 2020-12-14 ENCOUNTER — Other Ambulatory Visit: Payer: Self-pay | Admitting: Family Medicine

## 2020-12-18 ENCOUNTER — Other Ambulatory Visit: Payer: Self-pay

## 2020-12-18 ENCOUNTER — Ambulatory Visit: Payer: Medicare HMO | Admitting: Cardiology

## 2020-12-18 ENCOUNTER — Encounter: Payer: Self-pay | Admitting: Cardiology

## 2020-12-18 VITALS — BP 140/90 | HR 82 | Ht 62.0 in | Wt 267.0 lb

## 2020-12-18 DIAGNOSIS — I340 Nonrheumatic mitral (valve) insufficiency: Secondary | ICD-10-CM

## 2020-12-18 DIAGNOSIS — Z01812 Encounter for preprocedural laboratory examination: Secondary | ICD-10-CM

## 2020-12-18 DIAGNOSIS — I10 Essential (primary) hypertension: Secondary | ICD-10-CM

## 2020-12-18 DIAGNOSIS — R072 Precordial pain: Secondary | ICD-10-CM

## 2020-12-18 MED ORDER — METOPROLOL TARTRATE 50 MG PO TABS: 50.0000 mg | ORAL_TABLET | Freq: Once | ORAL | 0 refills | Status: DC

## 2020-12-18 MED ORDER — PINDOLOL 10 MG PO TABS: 20.0000 mg | ORAL_TABLET | Freq: Two times a day (BID) | ORAL | 11 refills | Status: DC

## 2020-12-18 MED ORDER — HYDRALAZINE HCL 50 MG PO TABS: 50.0000 mg | ORAL_TABLET | Freq: Two times a day (BID) | ORAL | 3 refills | Status: DC

## 2020-12-18 NOTE — Progress Notes (Signed)
Cardiology Office Note:    Date:  12/18/2020   ID:  Hailey, Barnes [birth year], MRN [id number]  PCP:  Lurline Del, [place]  Cardiologist:  Candee Furbish, MD  Advanced Practice Provider:  No care team member to display Electrophysiologist:  None       Referring MD: Lurline Del, DO    History of Present Illness:    Hailey Barnes is a 60 y.o. female here for the follow-up of SVT.  Prior sinus bradycardia with pause at night of 4.4 seconds.  Continuing to take pindolol however.  Back in December 2020, EMS initial heart rate was 150 with blood pressure 200/130.  Adenosine 6 mg converted to normal sinus rhythm.  Chest discomfort with tachycardia.  She has been feeling some occasional chest discomfort centralized medium in intensity burning-like.  Nonexertional.  No fevers chills nausea vomiting syncope bleeding  Past Medical History:  Diagnosis Date  . Arthritis    "knees" (02/21/2014), back  . CHF (congestive heart failure) ([place])   . Chronic bronchitis ([place])    "get it q yr" (02/21/2014)  . Chronic diastolic CHF (congestive heart failure) ([place])    a. Echo 10/23/2016: EF 75 %  . Chronic lower back pain   . Complication of anesthesia    "I don't come out well; I chew on my tongue"  . COPD (chronic obstructive pulmonary disease) ([place])   . Diabetes mellitus without complication ([place])    TYPE 2  . Family history of adverse reaction to anesthesia    BROTHER PONV, had a blood clot several days later and heart stopped  . Fatty liver    NONALCOLIC  . Fibroid   . Heart murmur   . Hypertension   . Infection of right eye    current dx on Saturday 7/15 - using drops  . Leg swelling    bilateral  . Migraine 1982-2009  . MVP (mitral valve prolapse)   . Neuromuscular disorder ([place])    right leg nerve pain  . Shortness of breath    WITH EXERTION  . Sinus pause    a. noted on telemetry during admission from 10/2016, lasting up to 4.4  seconds. BB discontinued.   . Sleep apnea 02/2016   CPAP 16 TO 20  . Stroke [facility]) noted on CAT 02/2014   "light", LLE weakness remains (03/30/2014)  . 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 Tingling in extremities 12/12/2010   Uric acid and electrolytes (02/23) normal but WBC elevated.   D/Dx: carpal tunnel, ulnar neuropathy Less likely: cervical radiculopathy, vasculitis     Past Surgical History:  Procedure Laterality Date  . [place]  . CHOLECYSTECTOMY N/A 06/17/2017   Procedure: LAPAROSCOPIC CHOLECYSTECTOMY WITH INTRAOPERATIVE CHOLANGIOGRAM;  Surgeon: Donnie Mesa, MD;  Location: [place];  Service: General;  Laterality: N/A;  . COLONOSCOPY WITH PROPOFOL N/A 04/28/2017   Procedure: COLONOSCOPY WITH PROPOFOL;  Surgeon: Manus Gunning, MD;  Location: [facility];  Service: Gastroenterology;  Laterality: N/A;  . DILATATION & CURETTAGE/HYSTEROSCOPY WITH MYOSURE N/A 05/06/2016   Procedure: DILATATION & CURETTAGE/HYSTEROSCOPY WITH MYOSURE;  Surgeon: Terrance Mass, MD;  Location: [facility];  Service: Gynecology;  Laterality: N/A;  request to follow around 8:45  requests one hour OR time  . DILATATION & CURETTAGE/HYSTEROSCOPY WITH MYOSURE N/A 04/03/2017   Procedure: DILATATION & CURETTAGE/HYSTEROSCOPY WITH MYOSURE;  Surgeon: Toney Rakes,  Starlyn Skeans, MD;  Location: [facility];  Service: Gynecology;  Laterality: N/A;  . DILATION AND CURETTAGE OF UTERUS    . ESOPHAGOGASTRODUODENOSCOPY (EGD) WITH PROPOFOL N/A 04/28/2017   Procedure: ESOPHAGOGASTRODUODENOSCOPY (EGD) WITH PROPOFOL;  Surgeon: Manus Gunning, MD;  Location: [facility];  Service: Gastroenterology;  Laterality: N/A;  . FOOT SURGERY Bilateral    "took bones out; put pins in"  . IR ABLATE LIVER CRYOABLATION  02/08/2020  . IR RADIOLOGIST EVAL & MGMT  02/03/2020  . KNEE ARTHROSCOPY Left 08/16/2018   Procedure: ARTHROSCOPY KNEE PARTIAL MEDIAL MENISECTOMY,  CHONDROPLASTY MEDIAL AND LATERAL, PLICA RELEASE MEDIAL;  Surgeon: Dorna Leitz, MD;  Location: [place];  Service: Orthopedics;  Laterality: Left;  . LEFT AND RIGHT HEART CATHETERIZATION WITH CORONARY ANGIOGRAM N/A 03/31/2014   Procedure: LEFT AND RIGHT HEART CATHETERIZATION WITH CORONARY ANGIOGRAM;  Surgeon: Birdie Riddle, MD;  Location: [place];  Service: Cardiovascular;  Laterality: N/A;  . MYOMECTOMY  YRS AGO  . sonohystogram  04/14/2016   [facility]: intramural fibroid, premenopausal endometrium, right fluid filled tubular structure    Current Medications: Current Meds  Medication Sig  . acetaminophen (TYLENOL) 325 MG tablet Take 2 tablets (650 mg total) by mouth every 6 (six) hours as needed.  Marland Kitchen albuterol (VENTOLIN HFA) 108 (90 Base) MCG/ACT inhaler Inhale 1-2 puffs into the lungs every 6 (six) hours as needed for wheezing or shortness of breath.  . allopurinol (ZYLOPRIM) 300 MG tablet TAKE 1 TABLET BY MOUTH EVERY DAY  . aspirin EC 81 MG tablet Take 81 mg by mouth daily.  Marland Kitchen atorvastatin (LIPITOR) 40 MG tablet Take 40 mg by mouth once a day  . BREO ELLIPTA 200-25 MCG/INH AEPB TAKE 1 PUFF BY MOUTH EVERY DAY  . empagliflozin (JARDIANCE) 25 MG TABS tablet Take 1 tablet (25 mg total) by mouth daily.  Marland Kitchen glucose blood (ACCU-CHEK AVIVA) test strip Use to check sugar up to 3 times daily  . KLOR-CON M20 20 MEQ tablet TAKE 1 TABLET BY MOUTH EVERY DAY  . Lancets (ACCU-CHEK SOFT TOUCH) lancets Use as instructed  . liraglutide (VICTOZA) 18 MG/3ML SOPN Inject 1.2IN minus 2 clicks under the skin once weekly.  Marland Kitchen lisinopril (ZESTRIL) 40 MG tablet Take 1 tablet (40 mg total) by mouth daily.  . metFORMIN (GLUCOPHAGE) 1000 MG tablet Take 1 tablet (1,000 mg total) by mouth 2 (two) times daily with a meal.  . metoprolol tartrate (LOPRESSOR) 50 MG tablet Take 1 tablet (50 mg total) by mouth once for 1 dose. Take 1 tablet 2 hours before the CT scan  . pindolol (VISKEN) 10 MG tablet Take  2 tablets (20 mg total) by mouth 2 (two) times daily.  Marland Kitchen spironolactone (ALDACTONE) 25 MG tablet Take 25 mg by mouth daily.  Marland Kitchen tiotropium (SPIRIVA) 18 MCG inhalation capsule Place 1 capsule (18 mcg total) into inhaler and inhale daily.  Marland Kitchen torsemide (DEMADEX) 20 MG tablet TAKE 1 TABLET BY MOUTH EVERY DAY MAY TAKE 1 EXTRA TABLET DAILY ONLY AS NEEDED  . triamcinolone (KENALOG) 0.025 % ointment APPLY 1 APPLICATION TOPICALLY 2 (TWO) TIMES DAILY AS NEEDED.  . [DISCONTINUED] hydrALAZINE (APRESOLINE) 25 MG tablet TAKE 1 TABLET BY MOUTH THREE TIMES A DAY  . [DISCONTINUED] hydrALAZINE (APRESOLINE) 50 MG tablet Take 1 tablet (50 mg total) by mouth 3 (three) times daily.  . [DISCONTINUED] pindolol (VISKEN) 10 MG tablet TAKE 2 TABLETS BY MOUTH 2 TIMES DAILY.     Allergies:   Bupropion and Norvasc [amlodipine]  Social History   Socioeconomic History  . Marital status: Divorced    Spouse name: Not on file  . Number of children: 1  . Years of education: 30  . Highest education level: 12th grade  Occupational History  . Occupation: Psychologist, educational    Comment: disabled  . Occupation: patient care assistance  Tobacco Use  . Smoking status: Current Every Day Smoker    Packs/day: 1.00    Years: 42.00    Pack years: 42.00    Types: Cigarettes    Start date: 10/20/1976  . Smokeless tobacco: Never Used  Vaping Use  . Vaping Use: Never used  Substance and Sexual Activity  . Alcohol use: Yes    Comment: very occasional  . Drug use: Yes    Types: "Crack" cocaine, Marijuana    Comment: 03/30/2014 "stopped using crack 02/21/2014"  . Sexual activity: Yes    Birth control/protection: Post-menopausal  Other Topics Concern  . Not on file  Social History Narrative   Divorced since 2015.   Lives alone. No pets. Daughter lives in [place]. Daughter helps with transportation. Talks daily on phone, sees once or twice a week. 2 grandkids.   Enjoys church at [employer], [place] geared towards people  with substance abuse history.    Lives in duplex apartment, one level, three stairs to get in. Has handrails, has grab bars in bathroom. Smoke alarms.   Bakes most meals, avoids starches, rice. Eats vegetables. Drinks tea, ginger ale.    Likes to go to movies, used to walk daily but not now due to knee pain. Likes to spend time with grandchildren, go out to eat. Spend time with family and circle of friends.    Social Determinants of Health   Financial Resource Strain: Not on file  Food Insecurity: Not on file  Transportation Needs: Not on file  Physical Activity: Not on file  Stress: Not on file  Social Connections: Not on file     Family History: The patient's family history includes COPD in her mother and sister; Cancer in her sister; Hypertension in her father, mother, sister, and sister.  ROS:   Please see the history of present illness.     All other systems reviewed and are negative.  EKGs/Labs/Other Studies Reviewed:    The following studies were reviewed today:  ECHO 2022 1. Left ventricular ejection fraction, by estimation, is 70 to 75%. The  left ventricle has hyperdynamic function. The left ventricle has no  regional wall motion abnormalities. Left ventricular diastolic parameters  are consistent with Grade I diastolic  dysfunction (impaired relaxation). Elevated left atrial pressure.  2. Right ventricular systolic function is normal. The right ventricular  size is normal. There is normal pulmonary artery systolic pressure. The  estimated right ventricular systolic pressure is 60.6 mmHg.  3. The mitral valve is normal in structure. Moderate mitral valve  regurgitation. No evidence of mitral stenosis.  4. The aortic valve is normal in structure. Aortic valve regurgitation is  not visualized. No aortic stenosis is present.  5. The inferior vena cava is normal in size with greater than 50%  respiratory variability, suggesting right atrial pressure of 3 mmHg.    EKG: 09/26/2020-sinus rhythm with old anterior infarct pattern  Recent Labs: 04/09/2020: ALT 21 09/24/2020: BUN 14; Creatinine, Ser 1.20; Hemoglobin 16.6; Platelets 259; Potassium 4.0; Sodium 138  Recent Lipid Panel    Component Value Date/Time   CHOL 137 04/09/2020 1119   TRIG 155 (H) 04/09/2020  1119   HDL 42 04/09/2020 1119   CHOLHDL 3.3 04/09/2020 1119   CHOLHDL 4.1 07/02/2017 0652   VLDL 23 07/02/2017 0652   LDLCALC 68 04/09/2020 1119     Risk Assessment/Calculations:      Physical Exam:    VS:  BP 140/90 (BP Location: Left Arm, Patient Position: Sitting, Cuff Size: Normal)   Pulse 82   Ht 5' 2"  (1.575 m)   Wt 267 lb (121.1 kg)   LMP 04/06/2017   SpO2 96%   BMI 48.83 kg/m     Wt Readings from Last 3 Encounters:  12/18/20 267 lb (121.1 kg)  12/10/20 266 lb 6.4 oz (120.8 kg)  12/03/20 274 lb 3.2 oz (124.4 kg)     GEN:  Well nourished, well developed in no acute distress HEENT: Normal NECK: No JVD; No carotid bruits LYMPHATICS: No lymphadenopathy CARDIAC: RRR, no murmurs, rubs, gallops RESPIRATORY:  Clear to auscultation without rales, wheezing or rhonchi  ABDOMEN: Soft, non-tender, non-distended MUSCULOSKELETAL:  No edema; No deformity  SKIN: Warm and dry NEUROLOGIC:  Alert and oriented x 3 PSYCHIATRIC:  Normal affect   ASSESSMENT:    1. Mitral valve insufficiency, unspecified etiology   2. Hypertension, unspecified type   3. Precordial pain   4. Pre-procedure lab exam    PLAN:    In order of problems listed above:  Chest pain -We will check coronary CT scan. -Prior cardiac catheterization several years ago showed no significant CAD.  PSVT -Continue with pindolol but at 20 mg twice a day.  She had been taking 15 twice a day  Essential hypertension -Still difficult to control.  Move hydralazine to 50 twice a day for convenience.  Severe LVH -Likely from uncontrolled hypertension  Tobacco use -Continue to encourage cessation  Mitral  regurgitation -Moderate on echocardiogram.  Continue to monitor       Medication Adjustments/Labs and Tests Ordered: Current medicines are reviewed at length with the patient today.  Concerns regarding medicines are outlined above.  Orders Placed This Encounter  Procedures  . CT CORONARY MORPH W/CTA COR W/SCORE W/CA W/CM &/OR WO/CM  . CT CORONARY FRACTIONAL FLOW RESERVE DATA PREP  . CT CORONARY FRACTIONAL FLOW RESERVE FLUID ANALYSIS  . Basic metabolic panel   Meds ordered this encounter  Medications  . pindolol (VISKEN) 10 MG tablet    Sig: Take 2 tablets (20 mg total) by mouth 2 (two) times daily.    Dispense:  120 tablet    Refill:  11  . hydrALAZINE (APRESOLINE) 50 MG tablet    Sig: Take 1 tablet (50 mg total) by mouth in the morning and at bedtime.    Dispense:  180 tablet    Refill:  3  . metoprolol tartrate (LOPRESSOR) 50 MG tablet    Sig: Take 1 tablet (50 mg total) by mouth once for 1 dose. Take 1 tablet 2 hours before the CT scan    Dispense:  1 tablet    Refill:  0    Patient Instructions  Medication Instructions:  Please take Pindolol 10 mg (2) tablets twice a day. Take Hydralazine 50 mg twice a day. Continue all other medications as listed.  *If you need a refill on your cardiac medications before your next appointment, please call your pharmacy*  Lab Work: Please have blood work before your Coronary CT. (BMP)  If you have labs (blood work) drawn today and your tests are completely normal, you will receive your results only by: Marland Kitchen MyChart  Message (if you have MyChart) OR . A paper copy in the mail If you have any lab test that is abnormal or we need to change your treatment, we will call you to review the results.  Testing/Procedures: Your cardiac CT will be scheduled at:   [facility] [address] [place], [place] [address] [phone]  Please arrive at the [facility] main entrance (entrance A) of [facility] 30 minutes  prior to test start time. Proceed to the [facility] Radiology Department (first floor) to check-in and test prep.  Please follow these instructions carefully (unless otherwise directed):  On the Night Before the Test: . Be sure to Drink plenty of water. . Do not consume any caffeinated/decaffeinated beverages or chocolate 12 hours prior to your test. . Do not take any antihistamines 12 hours prior to your test.  On the Day of the Test: . Drink plenty of water until 1 hour prior to the test. . Do not eat any food 4 hours prior to the test. . You may take your regular medications prior to the test.  . Take metoprolol (Lopressor) two hours prior to test. . HOLD Furosemide/Hydrochlorothiazide morning of the test. . FEMALES- please wear underwire-free bra if available   After the Test: . Drink plenty of water. . After receiving IV contrast, you may experience a mild flushed feeling. This is normal. . On occasion, you may experience a mild rash up to 24 hours after the test. This is not dangerous. If this occurs, you can take Benadryl 25 mg and increase your fluid intake. . If you experience trouble breathing, this can be serious. If it is severe call 911 IMMEDIATELY. If it is mild, please call our office. . If you take any of these medications: Glipizide/Metformin, Avandament, Glucavance, please do not take 48 hours after completing test unless otherwise instructed.   Once we have confirmed authorization from your insurance company, we will call you to set up a date and time for your test. Based on how quickly your insurance processes prior authorizations requests, please allow up to 4 weeks to be contacted for scheduling your Cardiac CT appointment. Be advised that routine Cardiac CT appointments could be scheduled as many as 8 weeks after your provider has ordered it.  For non-scheduling related questions, please contact the cardiac imaging nurse navigator should you have any  questions/concerns: Marchia Bond, Cardiac Imaging Nurse Navigator Gordy Clement, Cardiac Imaging Nurse Navigator [facility] Direct Office Dial: [phone]   For scheduling needs, including cancellations and rescheduling, please call [place], [phone].  Follow-Up: At [facility], you and your health needs are our priority.  As part of our continuing mission to provide you with exceptional heart care, we have created designated Provider Care Teams.  These Care Teams include your primary Cardiologist (physician) and Advanced Practice Providers (APPs -  Physician Assistants and Nurse Practitioners) who all work together to provide you with the care you need, when you need it.  We recommend signing up for the patient portal called "MyChart".  Sign up information is provided on this After Visit Summary.  MyChart is used to connect with patients for Virtual Visits (Telemedicine).  Patients are able to view lab/test results, encounter notes, upcoming appointments, etc.  Non-urgent messages can be sent to your provider as well.   To learn more about what you can do with MyChart, go to [email or web address].    Your next appointment:   6 month(s)  The format for your next appointment:   In Person  Provider:   Candee Furbish, MD   Thank you for choosing [facility]!!        Signed, Candee Furbish, MD  12/18/2020 3:38 PM    [place]

## 2020-12-18 NOTE — Patient Instructions (Signed)
Medication Instructions:  Please take Pindolol 10 mg (2) tablets twice a day. Take Hydralazine 50 mg twice a day. Continue all other medications as listed.  *If you need a refill on your cardiac medications before your next appointment, please call your pharmacy*  Lab Work: Please have blood work before your Coronary CT. (BMP)  If you have labs (blood work) drawn today and your tests are completely normal, you will receive your results only by: Marland Kitchen MyChart Message (if you have MyChart) OR . A paper copy in the mail If you have any lab test that is abnormal or we need to change your treatment, we will call you to review the results.  Testing/Procedures: Your cardiac CT will be scheduled at:   [facility] [address] [place], [place] [address] [phone]  Please arrive at the [facility] main entrance (entrance A) of [facility] 30 minutes prior to test start time. Proceed to the [facility] Radiology Department (first floor) to check-in and test prep.  Please follow these instructions carefully (unless otherwise directed):  On the Night Before the Test: . Be sure to Drink plenty of water. . Do not consume any caffeinated/decaffeinated beverages or chocolate 12 hours prior to your test. . Do not take any antihistamines 12 hours prior to your test.  On the Day of the Test: . Drink plenty of water until 1 hour prior to the test. . Do not eat any food 4 hours prior to the test. . You may take your regular medications prior to the test.  . Take metoprolol (Lopressor) two hours prior to test. . HOLD Furosemide/Hydrochlorothiazide morning of the test. . FEMALES- please wear underwire-free bra if available   After the Test: . Drink plenty of water. . After receiving IV contrast, you may experience a mild flushed feeling. This is normal. . On occasion, you may experience a mild rash up to 24 hours after the test. This is not dangerous. If this occurs, you can  take Benadryl 25 mg and increase your fluid intake. . If you experience trouble breathing, this can be serious. If it is severe call 911 IMMEDIATELY. If it is mild, please call our office. . If you take any of these medications: Glipizide/Metformin, Avandament, Glucavance, please do not take 48 hours after completing test unless otherwise instructed.   Once we have confirmed authorization from your insurance company, we will call you to set up a date and time for your test. Based on how quickly your insurance processes prior authorizations requests, please allow up to 4 weeks to be contacted for scheduling your Cardiac CT appointment. Be advised that routine Cardiac CT appointments could be scheduled as many as 8 weeks after your provider has ordered it.  For non-scheduling related questions, please contact the cardiac imaging nurse navigator should you have any questions/concerns: Marchia Bond, Cardiac Imaging Nurse Navigator Gordy Clement, Cardiac Imaging Nurse Navigator [facility] Direct Office Dial: [phone]   For scheduling needs, including cancellations and rescheduling, please call [place], [phone].  Follow-Up: At [facility], you and your health needs are our priority.  As part of our continuing mission to provide you with exceptional heart care, we have created designated Provider Care Teams.  These Care Teams include your primary Cardiologist (physician) and Advanced Practice Providers (APPs -  Physician Assistants and Nurse Practitioners) who all work together to provide you with the care you need, when you need it.  We recommend signing  up for the patient portal called "MyChart".  Sign up information is provided on this After Visit Summary.  MyChart is used to connect with patients for Virtual Visits (Telemedicine).  Patients are able to view lab/test results, encounter notes, upcoming appointments, etc.  Non-urgent messages can be sent to your  provider as well.   To learn more about what you can do with MyChart, go to [email or web address].    Your next appointment:   6 month(s)  The format for your next appointment:   In Person  Provider:   Candee Furbish, MD   Thank you for choosing [facility]!!

## 2020-12-21 ENCOUNTER — Telehealth: Payer: Self-pay

## 2020-12-21 NOTE — Telephone Encounter (Signed)
I attempted a Pindolol PA through covermymeds but was unable as I received tthis message: Everett Graff Key: [id number] Need help? Call us at [phone] Status: New (Not sent to plan) Eligibility could not be verified for this patient - patient not found. Please review patient information and re-submit. Drug: Pindolol 10MG tablets Form: Administrator, sports PA Form  I then called [employer] and attempted to do the PA over the phone with May. After working with May for more than 25 mins on the phone she stated that her system updated and is now showing that the pts [employer] ins coverage ended on 12/17/2020.  Pindolol PA can not be done as the pt has no ins currently.

## 2020-12-25 NOTE — Telephone Encounter (Signed)
Rescheduled 3/11

## 2020-12-28 ENCOUNTER — Ambulatory Visit: Payer: Medicare Other

## 2020-12-28 DIAGNOSIS — E118 Type 2 diabetes mellitus with unspecified complications: Secondary | ICD-10-CM | POA: Diagnosis not present

## 2020-12-28 DIAGNOSIS — I10 Essential (primary) hypertension: Secondary | ICD-10-CM | POA: Diagnosis not present

## 2020-12-28 DIAGNOSIS — G4733 Obstructive sleep apnea (adult) (pediatric): Secondary | ICD-10-CM | POA: Diagnosis not present

## 2020-12-28 DIAGNOSIS — K7581 Nonalcoholic steatohepatitis (NASH): Secondary | ICD-10-CM | POA: Diagnosis not present

## 2020-12-28 NOTE — Chronic Care Management (AMB) (Signed)
Care Management    RN Visit Note  12/28/2020 Name: Hailey Barnes MRN: [id number] DOB: [birth year]  Subjective: Hailey Barnes is a 60 y.o. year old female who is a primary care patient of Lurline Del, DO. The care management team was consulted for assistance with disease management and care coordination needs.    Engaged with patient by telephone for follow up visit in response to provider referral for case management and/or care coordination services.   Consent to Services:   Ms. Vanderhoof was given information about Care Management services today including:  1. Care Management services includes personalized support from designated clinical staff supervised by her physician, including individualized plan of care and coordination with other care providers 2. 24/7 contact phone numbers for assistance for urgent and routine care needs. 3. The patient may stop case management services at any time by phone call to the office staff.  Patient agreed to services and consent obtained.    Assessment: Patient continues to experience difficulty with not checking her blood pressures... See Care Plan below for interventions and patient self-care actives. Follow up Plan: Patient would like continued follow-up.  CCM RNCM will outreach the patient withint the next 7 days.. Patient will call office if needed prior to next encounter  Review of patient past medical history, allergies, medications, health status, including review of consultants reports, laboratory and other test data, was performed as part of comprehensive evaluation and provision of chronic care management services.   SDOH (Social Determinants of Health) assessments and interventions performed:    Care Plan  Allergies  Allergen Reactions  . Bupropion Other (See Comments)    Throat soreness and difficulty swallowing.   . Norvasc [Amlodipine]     Swelling    Outpatient Encounter Medications as of 12/28/2020  Medication Sig   . acetaminophen (TYLENOL) 325 MG tablet Take 2 tablets (650 mg total) by mouth every 6 (six) hours as needed.  Marland Kitchen albuterol (VENTOLIN HFA) 108 (90 Base) MCG/ACT inhaler Inhale 1-2 puffs into the lungs every 6 (six) hours as needed for wheezing or shortness of breath.  . allopurinol (ZYLOPRIM) 300 MG tablet TAKE 1 TABLET BY MOUTH EVERY DAY  . aspirin EC 81 MG tablet Take 81 mg by mouth daily.  Marland Kitchen atorvastatin (LIPITOR) 40 MG tablet Take 40 mg by mouth once a day  . BREO ELLIPTA 200-25 MCG/INH AEPB TAKE 1 PUFF BY MOUTH EVERY DAY  . empagliflozin (JARDIANCE) 25 MG TABS tablet Take 1 tablet (25 mg total) by mouth daily.  Marland Kitchen glucose blood (ACCU-CHEK AVIVA) test strip Use to check sugar up to 3 times daily  . hydrALAZINE (APRESOLINE) 50 MG tablet Take 1 tablet (50 mg total) by mouth in the morning and at bedtime.  Marland Kitchen KLOR-CON M20 20 MEQ tablet TAKE 1 TABLET BY MOUTH EVERY DAY  . Lancets (ACCU-CHEK SOFT TOUCH) lancets Use as instructed  . liraglutide (VICTOZA) 18 MG/3ML SOPN Inject 6.2VO minus 2 clicks under the skin once weekly.  Marland Kitchen lisinopril (ZESTRIL) 40 MG tablet Take 1 tablet (40 mg total) by mouth daily.  . metFORMIN (GLUCOPHAGE) 1000 MG tablet Take 1 tablet (1,000 mg total) by mouth 2 (two) times daily with a meal.  . metoprolol tartrate (LOPRESSOR) 50 MG tablet Take 1 tablet (50 mg total) by mouth once for 1 dose. Take 1 tablet 2 hours before the CT scan  . pindolol (VISKEN) 10 MG tablet Take 2 tablets (20 mg total) by mouth 2 (two) times daily.  Marland Kitchen  spironolactone (ALDACTONE) 25 MG tablet Take 25 mg by mouth daily.  Marland Kitchen tiotropium (SPIRIVA) 18 MCG inhalation capsule Place 1 capsule (18 mcg total) into inhaler and inhale daily.  Marland Kitchen torsemide (DEMADEX) 20 MG tablet TAKE 1 TABLET BY MOUTH EVERY DAY MAY TAKE 1 EXTRA TABLET DAILY ONLY AS NEEDED  . triamcinolone (KENALOG) 0.025 % ointment APPLY 1 APPLICATION TOPICALLY 2 (TWO) TIMES DAILY AS NEEDED.   No facility-administered encounter medications on file as  of 12/28/2020.    Patient Active Problem List   Diagnosis Date Noted  . Melanosis 11/07/2020  . Lesion of cervix 07/05/2020  . Right knee pain 05/07/2020  . Throat pain in adult 04/11/2020  . COPD (chronic obstructive pulmonary disease) ([place]) 02/17/2020  . Impingement syndrome, shoulder, right 08/29/2019  . Hyperuricemia 05/10/2019  . Foot pain, right 05/06/2019  . Nodule of skin of right thumb 04/15/2019  . Leg cramps 04/01/2019  . Tinea cruris 03/31/2019  . Lack of appetite 03/15/2019  . Wart on thumb 09/23/2018  . Acute medial meniscus tear of left knee 08/16/2018  . Plica of knee, left 14/78/2956  . Chondromalacia, left knee 08/16/2018  . Primary osteoarthritis of both knees 03/24/2018  . Bilateral primary osteoarthritis of first carpometacarpal joints 11/16/2017  . De Quervain's tenosynovitis, right 11/02/2017  . Carpal tunnel syndrome, bilateral 10/21/2017  . Vaginal itching 09/22/2017  . Dysuria 09/22/2017  . High risk sexual behavior 08/27/2017  . Benign neoplasm of sigmoid colon   . Insomnia 02/17/2017  . OSA (obstructive sleep apnea) 10/30/2016  . Sinus pause 10/23/2016  . Panic attack 10/22/2016  . Lower extremity edema   . Left lumbar radiculopathy 06/02/2016  . Hyperlipidemia 02/07/2016  . T2DM (type 2 diabetes mellitus) ([place]) 12/07/2015  . Occipital lymphadenopathy 09/21/2015  . Depression 04/19/2015  . Eczema 08/01/2014  . Diminished vision 06/23/2014  . Chronic diastolic CHF (congestive heart failure) ([place]) 06/13/2014  . Restrictive lung disease 05/10/2014  . Vaginal discharge 03/22/2014  . NASH (nonalcoholic steatohepatitis) 01/04/2014  . Hypertension 05/26/2010  . Morbid obesity ([place]) 12/17/2006  . Tobacco use disorder 12/17/2006    Conditions to be addressed/monitored: HTN  Care Plan : RN Case Manager  Updates made by Lazaro Arms, RN since 12/28/2020 12:00 AM    Problem: Hypertension (Hypertension)   Priority: High  Onset Date: 04/02/2020   Note:   BP Readings from Last 3 Encounters:  12/18/20 140/90  12/10/20 (!) 148/98  12/03/20 (!) 142/100    Current Barriers:  Marland Kitchen Knowledge Deficits related to basic understanding of hypertension pathophysiology and self care management  Nurse Case Manager Clinical Goal(s):  Marland Kitchen Over the next 30 days, patient will demonstrate improved adherence to prescribed treatment plan for hypertension as evidenced by taking all medications as prescribed, monitoring and recording blood pressure as directed, adhering to low sodium/DASH diet  Interventions:  . Evaluation of current treatment plan related to hypertension self management and patient's adherence to plan as established by provider. . Provided education to patient re: stroke prevention, s/s of heart attack and stroke, DASH diet, complications of uncontrolled blood pressure . Reviewed medications with patient and discussed importance of compliance . Discussed plans with patient for ongoing care management follow up and provided patient with direct contact information for care management team . Advised patient, providing education and rationale, to monitor blood pressure daily and record, calling PCP for findings outside established parameters.  . Reviewed scheduled/upcoming provider appointments including: patient states that she has an appointment with her nephrologist  today 12/28/20 @ 915 am and asked if I could follow up with her on Monday 12/31/20 . healthy diet promoted . pain assessed and managed . reduction of dietary sodium encouraged . medication adherence assessment completed- unable to assess at this time . support and encouragement provided . blood pressure trends reviewed- patient is not checking her BP at home. Encouraged the patient to check at least 3x/week and record values.  She denies any signs or symptom of HTN . home or ambulatory blood pressure monitoring encouraged  Patient Goals/Self Care Activities:  . Self administers  medications as prescribed . Attends all scheduled provider appointments . Calls provider office for new concerns, questions, or BP outside discussed parameters . Checks BP and records as discussed . Follows a low sodium diet/DASH diet       Lazaro Arms RN, BSN, [place] Phone: [phone] I Fax: [phone]

## 2020-12-31 ENCOUNTER — Telehealth: Payer: Self-pay

## 2020-12-31 ENCOUNTER — Telehealth: Payer: Medicare Other

## 2020-12-31 NOTE — Telephone Encounter (Signed)
  Care Management   Outreach Note  12/31/2020 Name: Hailey Barnes MRN: [id number] DOB: 1961/06/14  Referred by: Lurline Del, DO Reason for referral : Chronic Care Management (HTN)   An unsuccessful telephone outreach was attempted today. The patient was referred to the case management team for assistance with care management and care coordination.    Follow Up Plan: Telephone follow up appointment with care management team member scheduled for: 01/17/21 A HIPAA compliant phone message was left for the patient providing contact information and requesting a return call.   Lazaro Arms RN, BSN, [facility] Care Management Coordinator [place] Phone: [phone] I Fax: [phone]

## 2021-01-10 ENCOUNTER — Other Ambulatory Visit: Payer: Self-pay

## 2021-01-10 ENCOUNTER — Other Ambulatory Visit: Payer: Medicare Other

## 2021-01-10 DIAGNOSIS — R072 Precordial pain: Secondary | ICD-10-CM

## 2021-01-10 DIAGNOSIS — G4733 Obstructive sleep apnea (adult) (pediatric): Secondary | ICD-10-CM | POA: Diagnosis not present

## 2021-01-10 DIAGNOSIS — Z01812 Encounter for preprocedural laboratory examination: Secondary | ICD-10-CM | POA: Diagnosis not present

## 2021-01-10 LAB — BASIC METABOLIC PANEL
BUN/Creatinine Ratio: 10 (ref 9–23)
BUN: 10 mg/dL (ref 6–24)
CO2: 23 mmol/L (ref 20–29)
Calcium: 9.9 mg/dL (ref 8.7–10.2)
Chloride: 102 mmol/L (ref 96–106)
Creatinine, Ser: 0.98 mg/dL (ref 0.57–1.00)
Glucose: 91 mg/dL (ref 65–99)
Potassium: 4.2 mmol/L (ref 3.5–5.2)
Sodium: 141 mmol/L (ref 134–144)
eGFR: 67 mL/min/{1.73_m2} (ref >59)

## 2021-01-11 ENCOUNTER — Telehealth (HOSPITAL_COMMUNITY): Payer: Self-pay | Admitting: Emergency Medicine

## 2021-01-11 NOTE — Telephone Encounter (Signed)
Reaching out to patient to offer assistance regarding upcoming cardiac imaging study; pt verbalizes understanding of appt date/time, parking situation and where to check in, pre-test NPO status and medications ordered, and verified current allergies; name and call back number provided for further questions should they arise Marchia Bond RN Navigator Cardiac Imaging [facility] [phone] office [phone] cell   Holding torsemide, spironolactone; taking 20m metoprolol tartrate 2hr prior SClarise Cruz

## 2021-01-11 NOTE — Progress Notes (Signed)
I, [name], LAT, ATC, am serving as scribe for Dr. Lynne Leader.  Hailey Barnes is a 60 y.o. female who presents to [place] at [facility] today for f/u R carpal tunnel syndrome w/ NCV study review and L shoulder/neck pain. Pt was last seen by Dr. [name] on 12/10/20 and was advised proceed w/ NCV study.  She was also advised to f/u w/ PT regarding her L shoulder but has not completed any sessions. Since her last visit, pt reports that the L side of her neck and upper trap con't to bother her.  She states that she hit her head while trying to get into a [employer] about 3 weeks ago.  She states that her pain is getting progressively worse.  She did not get her NCV test done and is not interested in proceeding w/ that test as her R wrist is feeling better after the injection she had at her last visit.  Hit her head on a [employer] about 4 weeks ago and has pain in the left side of the neck and pain into both shoulders. No pain radiating down arms.   Dx imaging: 04/17/20 CT soft tissue neck  04/10/20 Neck XR  Pertinent review of systems: no fever or chills  Relevant historical information: Heart failure, diabetes   Exam:  BP (!) 142/100 (BP Location: Right Arm, Patient Position: Sitting, Cuff Size: Large)   Pulse 84   Ht 5' 2"  (1.575 m)   Wt 263 lb 9.6 oz (119.6 kg)   LMP 04/06/2017   SpO2 95%   BMI 48.21 kg/m  General: Well Developed, well nourished, and in no acute distress.   MSK: C-spine: Normal-appearing Nontender midline. Tender palpation cervical paraspinal musculature. Decreased cervical motion to left rotation and left lateral flexion. Number extremity strength reflexes and sensation are equal normal throughout bilateral upper extremity     Lab and Radiology Results  X-ray images C-spine obtained today personally and independently interpreted Loss of cervical lordosis indicating cervical neck spasm. Cervical DDD at lower portion of cervical spine. No  fractures visible. Await formal radiology review    Assessment and Plan: 60 y.o. female with neck pain thought to be mostly related to cervical muscle spasm and dysfunction.  Plan for physical therapy, and tizanidine.  Also recommend heating pad.  Recheck in about 6 weeks.  Return sooner if needed.   PDMP not reviewed this encounter. Orders Placed This Encounter  Procedures  . DG Cervical Spine 2 or 3 views    Standing Status:   Future    Number of Occurrences:   1    Standing Expiration Date:   01/14/2022    Order Specific Question:   Reason for Exam (SYMPTOM  OR DIAGNOSIS REQUIRED)    Answer:   eval left neck pain    Order Specific Question:   Is patient pregnant?    Answer:   No    Order Specific Question:   Preferred imaging location?    Answer:   [facility]  . Ambulatory referral to Physical Therapy    Referral Priority:   Routine    Referral Type:   Physical Medicine    Referral Reason:   Specialty Services Required    Requested Specialty:   Physical Therapy   Meds ordered this encounter  Medications  . tiZANidine (ZANAFLEX) 4 MG tablet    Sig: Take 1 tablet (4 mg total) by mouth every 6 (six) hours as needed for muscle spasms.  Dispense:  30 tablet    Refill:  1     Discussed warning signs or symptoms. Please see discharge instructions. Patient expresses understanding.   The above documentation has been reviewed and is accurate and complete Lynne Leader, M.D.

## 2021-01-14 ENCOUNTER — Ambulatory Visit (INDEPENDENT_AMBULATORY_CARE_PROVIDER_SITE_OTHER): Payer: Medicare Other

## 2021-01-14 ENCOUNTER — Ambulatory Visit (HOSPITAL_COMMUNITY): Admission: RE | Admit: 2021-01-14 | Payer: Medicare Other | Source: Ambulatory Visit

## 2021-01-14 ENCOUNTER — Encounter: Payer: Self-pay | Admitting: Family Medicine

## 2021-01-14 ENCOUNTER — Ambulatory Visit (INDEPENDENT_AMBULATORY_CARE_PROVIDER_SITE_OTHER): Payer: Medicare Other | Admitting: Family Medicine

## 2021-01-14 ENCOUNTER — Other Ambulatory Visit: Payer: Self-pay

## 2021-01-14 ENCOUNTER — Telehealth: Payer: Self-pay | Admitting: Cardiology

## 2021-01-14 VITALS — BP 142/100 | HR 84 | Ht 62.0 in | Wt 263.6 lb

## 2021-01-14 DIAGNOSIS — M542 Cervicalgia: Secondary | ICD-10-CM

## 2021-01-14 MED ORDER — TIZANIDINE HCL 4 MG PO TABS: 4.0000 mg | ORAL_TABLET | Freq: Four times a day (QID) | ORAL | 1 refills | Status: DC | PRN

## 2021-01-14 MED ORDER — METOPROLOL TARTRATE 50 MG PO TABS: 50.0000 mg | ORAL_TABLET | Freq: Once | ORAL | 0 refills | Status: DC

## 2021-01-14 NOTE — Telephone Encounter (Signed)
Medication refilled as requested.

## 2021-01-14 NOTE — Telephone Encounter (Signed)
Pt is requesting another refill on metoprolol for CT scan. Would Dr. Marlou Porch like to refill this medication again? Please address

## 2021-01-14 NOTE — Patient Instructions (Addendum)
Thank you for coming in today.  I've referred you to Physical Therapy.  Let us know if you don't hear from them in one week.  Try the muscle relaxer mostly at bedtime.   Use heat.   Recheck in 6 weeks.   Keep me updated.

## 2021-01-14 NOTE — Telephone Encounter (Signed)
*  STAT* If patient is at the pharmacy, call can be transferred to refill team.   1. Which medications need to be refilled? (please list name of each medication and dose if known)  metoprolol tartrate (LOPRESSOR) 50 MG tablet(Expired)  2. Which pharmacy/location (including street and city if local pharmacy) is medication to be sent to? [id number]- [place], [place] - [address] AT [address]. Do they need a 30 day or 90 day supply? One pill   Patient was late and was not able to do her CT scan. She took the medication as directed but will need another pill for when her test is r/s

## 2021-01-15 NOTE — Progress Notes (Signed)
X-ray cervical spine shows some arthritis changes

## 2021-01-16 ENCOUNTER — Telehealth (HOSPITAL_COMMUNITY): Payer: Self-pay | Admitting: Emergency Medicine

## 2021-01-16 NOTE — Telephone Encounter (Signed)
Reaching out to patient to offer assistance regarding upcoming cardiac imaging study; pt verbalizes understanding of appt date/time, parking situation and where to check in, pre-test NPO status and medications ordered, and verified current allergies; name and call back number provided for further questions should they arise Marchia Bond RN Navigator Cardiac Imaging [facility] [phone] office [phone] cell  Pt holding torsemide (no longer takes spironolactone), albuterol Taking 10m metoprolol PTA

## 2021-01-17 ENCOUNTER — Ambulatory Visit (HOSPITAL_COMMUNITY)
Admission: RE | Admit: 2021-01-17 | Discharge: 2021-01-17 | Disposition: A | Discharge status: Home / Self Care | Payer: Medicare Other | Source: Ambulatory Visit | Attending: Cardiology | Admitting: Cardiology

## 2021-01-17 ENCOUNTER — Other Ambulatory Visit: Payer: Self-pay

## 2021-01-17 ENCOUNTER — Telehealth: Payer: Medicare Other

## 2021-01-17 DIAGNOSIS — I700 Atherosclerosis of aorta: Secondary | ICD-10-CM | POA: Diagnosis not present

## 2021-01-17 DIAGNOSIS — R072 Precordial pain: Secondary | ICD-10-CM | POA: Diagnosis not present

## 2021-01-17 MED ORDER — NITROGLYCERIN 0.4 MG SL SUBL
SUBLINGUAL_TABLET | SUBLINGUAL | Status: AC
  Filled 2021-01-17: qty 2

## 2021-01-17 MED ORDER — METOPROLOL TARTRATE 5 MG/5ML IV SOLN
5.0000 mg | INTRAVENOUS | Status: DC | PRN
  Administered 2021-01-17: 5 mg via INTRAVENOUS

## 2021-01-17 MED ORDER — METOPROLOL TARTRATE 5 MG/5ML IV SOLN
INTRAVENOUS | Status: AC
  Administered 2021-01-17: 5 mg via INTRAVENOUS
  Filled 2021-01-17: qty 5

## 2021-01-17 MED ORDER — METOPROLOL TARTRATE 5 MG/5ML IV SOLN
INTRAVENOUS | Status: AC
  Filled 2021-01-17: qty 5

## 2021-01-17 MED ORDER — NITROGLYCERIN 0.4 MG SL SUBL
0.8000 mg | SUBLINGUAL_TABLET | Freq: Once | SUBLINGUAL | Status: AC
  Administered 2021-01-17: 0.8 mg via SUBLINGUAL

## 2021-01-17 MED ORDER — IOHEXOL 350 MG/ML SOLN
80.0000 mL | Freq: Once | INTRAVENOUS | Status: AC | PRN
  Administered 2021-01-17: 80 mL via INTRAVENOUS

## 2021-01-17 MED ORDER — DILTIAZEM HCL 25 MG/5ML IV SOLN
INTRAVENOUS | Status: AC
  Filled 2021-01-17: qty 5

## 2021-01-21 ENCOUNTER — Telehealth: Payer: Self-pay

## 2021-01-21 ENCOUNTER — Ambulatory Visit: Payer: Medicare HMO | Admitting: Family Medicine

## 2021-01-21 DIAGNOSIS — M5416 Radiculopathy, lumbar region: Secondary | ICD-10-CM

## 2021-01-21 NOTE — Telephone Encounter (Signed)
Patient calls to get an order placed for another epidural in her back.

## 2021-01-21 NOTE — Telephone Encounter (Signed)
Orders placed.

## 2021-01-22 ENCOUNTER — Other Ambulatory Visit: Payer: Self-pay

## 2021-01-22 ENCOUNTER — Encounter: Payer: Self-pay | Admitting: Family Medicine

## 2021-01-22 ENCOUNTER — Other Ambulatory Visit (HOSPITAL_COMMUNITY)
Admission: RE | Admit: 2021-01-22 | Discharge: 2021-01-22 | Disposition: A | Discharge status: Home / Self Care | Payer: Medicare Other | Source: Ambulatory Visit | Attending: Family Medicine | Admitting: Family Medicine

## 2021-01-22 ENCOUNTER — Ambulatory Visit (INDEPENDENT_AMBULATORY_CARE_PROVIDER_SITE_OTHER): Payer: Medicare Other | Admitting: Family Medicine

## 2021-01-22 DIAGNOSIS — I10 Essential (primary) hypertension: Secondary | ICD-10-CM | POA: Diagnosis not present

## 2021-01-22 DIAGNOSIS — E119 Type 2 diabetes mellitus without complications: Secondary | ICD-10-CM | POA: Diagnosis not present

## 2021-01-22 DIAGNOSIS — N898 Other specified noninflammatory disorders of vagina: Secondary | ICD-10-CM

## 2021-01-22 DIAGNOSIS — M79672 Pain in left foot: Secondary | ICD-10-CM | POA: Diagnosis not present

## 2021-01-22 LAB — POCT URINALYSIS DIP (MANUAL ENTRY)
Bilirubin, UA: NEGATIVE
Blood, UA: NEGATIVE
Glucose, UA: 500 mg/dL — AB
Ketones, POC UA: NEGATIVE mg/dL
Leukocytes, UA: NEGATIVE
Nitrite, UA: NEGATIVE
Protein Ur, POC: NEGATIVE mg/dL
Spec Grav, UA: 1.015 (ref 1.010–1.025)
Urobilinogen, UA: 0.2 E.U./dL
pH, UA: 6 (ref 5.0–8.0)

## 2021-01-22 LAB — POCT WET PREP (WET MOUNT)
Clue Cells Wet Prep Whiff POC: NEGATIVE
Trichomonas Wet Prep HPF POC: ABSENT

## 2021-01-22 LAB — POCT GLYCOSYLATED HEMOGLOBIN (HGB A1C): HbA1c, POC (controlled diabetic range): 6 % (ref 0.0–7.0)

## 2021-01-22 MED ORDER — ACETAMINOPHEN 500 MG PO CAPS: 500.0000 mg | ORAL_CAPSULE | Freq: Once | ORAL | Status: DC

## 2021-01-22 MED ORDER — FLUCONAZOLE 150 MG PO TABS: 150.0000 mg | ORAL_TABLET | Freq: Once | ORAL | 0 refills | Status: AC

## 2021-01-22 NOTE — Assessment & Plan Note (Signed)
A1C of 6.0 today despite being off metformin. Continue to home Metformin. F/U with PCP in 2-3 months for reassessment. She agreed with the plan.

## 2021-01-22 NOTE — Patient Instructions (Signed)
Urinary Tract Infection, Adult A urinary tract infection (UTI) is an infection of any part of the urinary tract. The urinary tract includes:  The kidneys.  The ureters.  The bladder.  The urethra. These organs make, store, and get rid of pee (urine) in the body. What are the causes? This infection is caused by germs (bacteria) in your genital area. These germs grow and cause swelling (inflammation) of your urinary tract. What increases the risk? The following factors may make you more likely to develop this condition:  Using a small, thin tube (catheter) to drain pee.  Not being able to control when you pee or poop (incontinence).  Being female. If you are female, these things can increase the risk: ? Using these methods to prevent pregnancy:  A medicine that kills sperm (spermicide).  A device that blocks sperm (diaphragm). ? Having low levels of a female hormone (estrogen). ? Being pregnant. You are more likely to develop this condition if:  You have genes that add to your risk.  You are sexually active.  You take antibiotic medicines.  You have trouble peeing because of: ? A prostate that is bigger than normal, if you are female. ? A blockage in the part of your body that drains pee from the bladder. ? A kidney stone. ? A nerve condition that affects your bladder. ? Not getting enough to drink. ? Not peeing often enough.  You have other conditions, such as: ? Diabetes. ? A weak disease-fighting system (immune system). ? Sickle cell disease. ? Gout. ? Injury of the spine. What are the signs or symptoms? Symptoms of this condition include:  Needing to pee right away.  Peeing small amounts often.  Pain or burning when peeing.  Blood in the pee.  Pee that smells bad or not like normal.  Trouble peeing.  Pee that is cloudy.  Fluid coming from the vagina, if you are female.  Pain in the belly or lower back. Other symptoms include:  Vomiting.  Not  feeling hungry.  Feeling mixed up (confused). This may be the first symptom in older adults.  Being tired and grouchy (irritable).  A fever.  Watery poop (diarrhea). How is this treated?  Taking antibiotic medicine.  Taking other medicines.  Drinking enough water. In some cases, you may need to see a specialist. Follow these instructions at home: Medicines  Take over-the-counter and prescription medicines only as told by your doctor.  If you were prescribed an antibiotic medicine, take it as told by your doctor. Do not stop taking it even if you start to feel better. General instructions  Make sure you: ? Pee until your bladder is empty. ? Do not hold pee for a long time. ? Empty your bladder after sex. ? Wipe from front to back after peeing or pooping if you are a female. Use each tissue one time when you wipe.  Drink enough fluid to keep your pee pale yellow.  Keep all follow-up visits.   Contact a doctor if:  You do not get better after 1-2 days.  Your symptoms go away and then come back. Get help right away if:  You have very bad back pain.  You have very bad pain in your lower belly.  You have a fever.  You have chills.  You feeling like you will vomit or you vomit. Summary  A urinary tract infection (UTI) is an infection of any part of the urinary tract.  This condition is caused by   germs in your genital area.  There are many risk factors for a UTI.  Treatment includes antibiotic medicines.  Drink enough fluid to keep your pee pale yellow. This information is not intended to replace advice given to you by your health care provider. Make sure you discuss any questions you have with your health care provider. Document Revised: 05/18/2020 Document Reviewed: 05/18/2020 Elsevier Patient Education  2021 Elsevier Inc.  

## 2021-01-22 NOTE — Assessment & Plan Note (Signed)
BP elevated today. Was not repeated after visit due to pain following pelvic exam. I did schedule f/u with me next Tuesday for reassessment. No medication adjustment at this time. She agreed with the plan.

## 2021-01-22 NOTE — Telephone Encounter (Signed)
Patient aware and is scheduled for this coming Thursday.

## 2021-01-22 NOTE — Progress Notes (Signed)
    SUBJECTIVE:   CHIEF COMPLAINT / HPI:  Urinary Frequency  This is a new (pain with urination) problem. The problem occurs intermittently. The problem has been waxing and waning. Quality: pressure with urination. Symptoms similar to previous UTI. There has been no fever. She is sexually active (New partner). Associated symptoms include a discharge and frequency. Pertinent negatives include no hematuria or vomiting. She has tried increased fluids (Cranberry juice) for the symptoms. Hx of UTI   DM2: Sick on Metformin, hence she self d/ced meds. Otherwise, compliant with other meds. No new concerns.  Vaginitis: C/O vaginal discharge x one episode last week. She is sexually active with a new partner with no use of protection. She will like to get GC/Chlam screen. No other concerns.  Heel Pain: C/O left heel pain x 3 weeks. Worse with ambulation. Worse at night. Feels like there is a bump on her heel that causes pain. No swelling or redness.  HTN: Compliant with all her meds. No other concerns.  PERTINENT  PMH / PSH: PMX reviewed.    OBJECTIVE:   BP (!) 147/104   Pulse 82   Ht 5' 2"  (1.575 m)   Wt 266 lb 3.2 oz (120.7 kg)   LMP 04/06/2017   SpO2 97%   BMI 48.69 kg/m   Physical Exam Vitals and nursing note reviewed. Exam conducted with a chaperone present Lavell Anchors).  Cardiovascular:     Rate and Rhythm: Normal rate and regular rhythm.     Heart sounds: Normal heart sounds. No murmur heard.   Pulmonary:     Effort: Pulmonary effort is normal. No respiratory distress.     Breath sounds: Normal breath sounds. No wheezing.  Abdominal:     General: Abdomen is flat. Bowel sounds are normal. There is no distension.     Palpations: Abdomen is soft. There is no mass.     Tenderness: There is no abdominal tenderness.  Genitourinary:    Vagina: No signs of injury. Vaginal discharge present.     Cervix: Discharge present.     Comments: While attempting to bring out the speculum,  it clamped on her cervix causing pain.  Exam was terminated after.  Musculoskeletal:     Comments: No leg edema or swelling  Neurological:     Mental Status: She is alert.      ASSESSMENT/PLAN:  Increase urine frequency: UA negative for infection Patient reassured.  Vaginitis:  Wet prep showed yeast. I called and discussed result with her. Diflucan escribed. Note that she experienced pain with speculum removal. Tylenol was given during this visit. I called later to check on her. Pain improved but present. Return precaution discussed. GC/Chlamydia pending.  T2DM (type 2 diabetes mellitus) (HCC) A1C of 6.0 today despite being off metformin. Continue to home Metformin. F/U with PCP in 2-3 months for reassessment. She agreed with the plan.  Hypertension BP elevated today. Was not repeated after visit due to pain following pelvic exam. I did schedule f/u with me next Tuesday for reassessment. No medication adjustment at this time. She agreed with the plan.   Heel pain: Likely calcaneal bone spur. Discussed referral to podiatrist. She can also get her yearly DM foot exam with them. She agreed with the plan. Referral placed.  Andrena Mews, MD [place]

## 2021-01-23 ENCOUNTER — Telehealth: Payer: Self-pay | Admitting: Family Medicine

## 2021-01-23 LAB — CERVICOVAGINAL ANCILLARY ONLY
Chlamydia: NEGATIVE
Comment: NEGATIVE
Comment: NEGATIVE
Comment: NORMAL
Neisseria Gonorrhea: NEGATIVE
Trichomonas: POSITIVE — AB

## 2021-01-23 MED ORDER — ACETAMINOPHEN 500 MG PO TABS
500.0000 mg | ORAL_TABLET | Freq: Once | ORAL | Status: AC
  Administered 2021-01-22: 500 mg via ORAL

## 2021-01-23 MED ORDER — ACETAMINOPHEN 500 MG PO TABS
500.0000 mg | ORAL_TABLET | Freq: Once | ORAL | Status: AC
  Administered 2021-01-23: 500 mg via ORAL

## 2021-01-23 MED ORDER — METRONIDAZOLE 500 MG PO TABS: 500.0000 mg | ORAL_TABLET | Freq: Two times a day (BID) | ORAL | 0 refills | Status: AC

## 2021-01-23 NOTE — Telephone Encounter (Signed)
+  Trich discussed with the patient.  I advised that she informs her partner so he can get tested and treated as well. Avoid alcohol intake while on Metronidazole.  F/U in 3 weeks for TOC. I reminded her of her F/U appointment with me for her HTN.  She agreed with the plan.  Note routed to RN team to report to the health department.

## 2021-01-23 NOTE — Addendum Note (Signed)
Addended by: Lavell Anchors A on: 01/23/2021 10:02 AM   Modules accepted: Orders

## 2021-01-23 NOTE — Addendum Note (Signed)
Addended by: Lavell Anchors A on: 01/23/2021 09:17 AM   Modules accepted: Orders

## 2021-01-24 ENCOUNTER — Encounter: Payer: Self-pay | Admitting: Family Medicine

## 2021-01-24 ENCOUNTER — Other Ambulatory Visit: Payer: Medicare Other

## 2021-01-24 ENCOUNTER — Other Ambulatory Visit: Payer: Self-pay | Admitting: Family Medicine

## 2021-01-25 ENCOUNTER — Ambulatory Visit
Admission: RE | Admit: 2021-01-25 | Discharge: 2021-01-25 | Disposition: A | Discharge status: Home / Self Care | Payer: Medicare Other | Source: Ambulatory Visit | Attending: Sports Medicine | Admitting: Sports Medicine

## 2021-01-25 ENCOUNTER — Other Ambulatory Visit: Payer: Self-pay

## 2021-01-25 DIAGNOSIS — M47816 Spondylosis without myelopathy or radiculopathy, lumbar region: Secondary | ICD-10-CM | POA: Diagnosis not present

## 2021-01-25 DIAGNOSIS — M5416 Radiculopathy, lumbar region: Secondary | ICD-10-CM

## 2021-01-25 MED ORDER — METHYLPREDNISOLONE ACETATE 40 MG/ML INJ SUSP (RADIOLOG
120.0000 mg | Freq: Once | INTRAMUSCULAR | Status: AC
  Administered 2021-01-25: 120 mg via EPIDURAL

## 2021-01-25 MED ORDER — IOPAMIDOL (ISOVUE-M 200) INJECTION 41%
10.0000 mL | Freq: Once | INTRAMUSCULAR | Status: AC
  Administered 2021-01-25: 10 mL via EPIDURAL

## 2021-01-25 NOTE — Discharge Instructions (Signed)

## 2021-01-26 ENCOUNTER — Other Ambulatory Visit: Payer: Self-pay | Admitting: Family Medicine

## 2021-01-28 NOTE — Telephone Encounter (Signed)
This has been done.

## 2021-01-29 ENCOUNTER — Encounter: Payer: Self-pay | Admitting: Family Medicine

## 2021-01-29 ENCOUNTER — Other Ambulatory Visit: Payer: Self-pay

## 2021-01-29 ENCOUNTER — Ambulatory Visit (INDEPENDENT_AMBULATORY_CARE_PROVIDER_SITE_OTHER): Payer: Medicare Other | Admitting: Family Medicine

## 2021-01-29 VITALS — BP 186/111 | HR 74 | Ht 62.0 in | Wt 270.4 lb

## 2021-01-29 DIAGNOSIS — Z114 Encounter for screening for human immunodeficiency virus [HIV]: Secondary | ICD-10-CM | POA: Diagnosis not present

## 2021-01-29 DIAGNOSIS — I10 Essential (primary) hypertension: Secondary | ICD-10-CM

## 2021-01-29 DIAGNOSIS — A5901 Trichomonal vulvovaginitis: Secondary | ICD-10-CM | POA: Insufficient documentation

## 2021-01-29 DIAGNOSIS — Z113 Encounter for screening for infections with a predominantly sexual mode of transmission: Secondary | ICD-10-CM | POA: Diagnosis not present

## 2021-01-29 MED ORDER — HYDRALAZINE HCL 25 MG PO TABS: 75.0000 mg | ORAL_TABLET | Freq: Three times a day (TID) | ORAL | Status: DC

## 2021-01-29 NOTE — Patient Instructions (Signed)
It was nice seeing you today. Your BP is still pretty elevated. Please start Hydralazine as instructed at 75 mg TID. Continue home BP measures and f/u with [place] in 2 weeks.

## 2021-01-29 NOTE — Progress Notes (Signed)
    SUBJECTIVE:   CHIEF COMPLAINT / HPI:   HTN:  She is here for a follow-up. She has not been checking her BP at home. For her BP, she was on Aldactone but was d/ced by her Nephrologist about one month ago due to GI symptoms. Amlodipine gave her leg swelling, and she does not want to get back on it. Her Nephrologist increased her Hydralazine to 25 mg three-tabs TID (i.e., 75 mg TID). However,  she has been taking it BID instead. Her other meds, which she is compliant with, are; Lisinopril 40 mg QD, Pindolol 20 mg BID, and Torsemide 20 mg QD. She denies any symptoms.  Trich: Her last dose of treatment is today. Her partner is also getting treated. No new concern. Here for f/u.  PERTINENT  PMH / PSH: PMX reviewed.   OBJECTIVE:   Vitals:   01/29/21 0857 01/29/21 0911  BP: (!) 173/99 (!) 186/111  Pulse: 74   SpO2: 96%   Weight: 270 lb 6.4 oz (122.7 kg)   Height: 5' 2"  (1.575 m)     Physical Exam Vitals and nursing note reviewed.  Cardiovascular:     Rate and Rhythm: Normal rate and regular rhythm.     Heart sounds: Normal heart sounds.  Pulmonary:     Effort: Pulmonary effort is normal. No respiratory distress.     Breath sounds: Normal breath sounds. No wheezing or rhonchi.  Abdominal:     General: Bowel sounds are normal. There is no distension.     Palpations: Abdomen is soft.     Tenderness: There is no abdominal tenderness.  Musculoskeletal:     Right lower leg: No edema.     Left lower leg: No edema.      ASSESSMENT/PLAN:   Hypertension Uncontrolled. Non-adherence to Hydralazine recommendation. I encouraged her to take Hydralazine 75 mg TID as instruction and monitor BP closely at home. Continue other home regimen. I suspect she will need to increase the dose of her other regimen. F/U in 1-2 weeks for medication dose adjustment with her PCP. She agreed with the plan. Appointment scheduled.  Trichomonas vaginitis With a new partner who is also getting  treatment. She will like to remarry since her husband passed about a year ago. HIV/RPR discussed and were done today. I will contact her with the result. PCP to consider discussing PrEP with her. Trich TOC in 3 weeks with PCP. I pray she gets her wish of getting remarried soon. She is otherwise, doing well. No new concerns.     Andrena Mews, MD [place]

## 2021-01-29 NOTE — Assessment & Plan Note (Signed)
Uncontrolled. Non-adherence to Hydralazine recommendation. I encouraged her to take Hydralazine 75 mg TID as instruction and monitor BP closely at home. Continue other home regimen. I suspect she will need to increase the dose of her other regimen. F/U in 1-2 weeks for medication dose adjustment with her PCP. She agreed with the plan. Appointment scheduled.

## 2021-01-29 NOTE — Assessment & Plan Note (Addendum)
With a new partner who is also getting treatment. She will like to remarry since her husband passed about a year ago. HIV/RPR discussed and were done today. I will contact her with the result. PCP to consider discussing PrEP with her. Trich TOC in 3 weeks with PCP. I pray she gets her wish of getting remarried soon. She is otherwise, doing well. No new concerns.

## 2021-01-30 ENCOUNTER — Ambulatory Visit (INDEPENDENT_AMBULATORY_CARE_PROVIDER_SITE_OTHER): Payer: Medicare Other | Admitting: Podiatry

## 2021-01-30 ENCOUNTER — Ambulatory Visit: Payer: Medicare Other

## 2021-01-30 ENCOUNTER — Ambulatory Visit (INDEPENDENT_AMBULATORY_CARE_PROVIDER_SITE_OTHER): Payer: Medicare Other

## 2021-01-30 ENCOUNTER — Ambulatory Visit: Payer: Medicare Other | Attending: Family Medicine | Admitting: Physical Therapy

## 2021-01-30 ENCOUNTER — Telehealth: Payer: Self-pay | Admitting: Family Medicine

## 2021-01-30 ENCOUNTER — Other Ambulatory Visit: Payer: Self-pay | Admitting: Podiatry

## 2021-01-30 DIAGNOSIS — R296 Repeated falls: Secondary | ICD-10-CM | POA: Insufficient documentation

## 2021-01-30 DIAGNOSIS — M722 Plantar fascial fibromatosis: Secondary | ICD-10-CM | POA: Diagnosis not present

## 2021-01-30 DIAGNOSIS — M79672 Pain in left foot: Secondary | ICD-10-CM

## 2021-01-30 DIAGNOSIS — R293 Abnormal posture: Secondary | ICD-10-CM | POA: Insufficient documentation

## 2021-01-30 DIAGNOSIS — M6281 Muscle weakness (generalized): Secondary | ICD-10-CM | POA: Insufficient documentation

## 2021-01-30 DIAGNOSIS — M542 Cervicalgia: Secondary | ICD-10-CM | POA: Insufficient documentation

## 2021-01-30 DIAGNOSIS — L989 Disorder of the skin and subcutaneous tissue, unspecified: Secondary | ICD-10-CM | POA: Diagnosis not present

## 2021-01-30 DIAGNOSIS — G5601 Carpal tunnel syndrome, right upper limb: Secondary | ICD-10-CM | POA: Insufficient documentation

## 2021-01-30 DIAGNOSIS — M25512 Pain in left shoulder: Secondary | ICD-10-CM | POA: Insufficient documentation

## 2021-01-30 LAB — RPR: RPR Ser Ql: NONREACTIVE

## 2021-01-30 LAB — HIV ANTIBODY (ROUTINE TESTING W REFLEX): HIV Screen 4th Generation wRfx: NONREACTIVE

## 2021-01-30 MED ORDER — MELOXICAM 15 MG PO TABS: 15.0000 mg | ORAL_TABLET | Freq: Every day | ORAL | 1 refills | Status: DC

## 2021-01-30 MED ORDER — BETAMETHASONE SOD PHOS & ACET 6 (3-3) MG/ML IJ SUSP
3.0000 mg | Freq: Once | INTRAMUSCULAR | Status: AC
  Administered 2021-01-30: 3 mg via INTRA_ARTICULAR

## 2021-01-30 NOTE — Progress Notes (Signed)
Subjective: 60 y.o. female presenting as a new patient for evaluation of left heel and foot pain this been going on for approximately 1 month.  Patient states that she goes barefoot around her house the majority of the day.  She denies a history of injury.  She presents for further treatment evaluation.  She has not done anything for treatment currently   Past Medical History:  Diagnosis Date  . Arthritis    "knees" (02/21/2014), back  . CHF (congestive heart failure) ([place])   . Chronic bronchitis ([place])    "get it q yr" (02/21/2014)  . Chronic diastolic CHF (congestive heart failure) ([place])    a. Echo 10/23/2016: EF 75 %  . Chronic lower back pain   . Complication of anesthesia    "I don't come out well; I chew on my tongue"  . COPD (chronic obstructive pulmonary disease) ([place])   . Diabetes mellitus without complication ([place])    TYPE 2  . Family history of adverse reaction to anesthesia    BROTHER PONV, had a blood clot several days later and heart stopped  . Fatty liver    NONALCOLIC  . Fibroid   . Heart murmur   . Hypertension   . Infection of right eye    current dx on Saturday 7/15 - using drops  . Leg swelling    bilateral  . Migraine 1982-2009  . MVP (mitral valve prolapse)   . Neuromuscular disorder ([place])    right leg nerve pain  . Shortness of breath    WITH EXERTION  . Sinus pause    a. noted on telemetry during admission from 10/2016, lasting up to 4.4 seconds. BB discontinued.   . Sleep apnea 02/2016   CPAP 16 TO 20  . Stroke [facility]) noted on CAT 02/2014   "light", LLE weakness remains (03/30/2014)  . 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 Tingling in extremities 12/12/2010   Uric acid and electrolytes (02/23) normal but WBC elevated.   D/Dx: carpal tunnel, ulnar neuropathy Less likely: cervical radiculopathy, vasculitis      Objective: Physical Exam General: The patient is alert and oriented x3 in no acute  distress.  Dermatology: Skin is warm, dry and supple bilateral lower extremities. Negative for open lesions or macerations bilateral.   Vascular: Dorsalis Pedis and Posterior Tibial pulses palpable bilateral.  Capillary fill time is immediate to all digits.  Neurological: Epicritic and protective threshold intact bilateral.   Musculoskeletal: Tenderness to palpation to the plantar aspect of the left heel along the plantar fascia. All other joints range of motion within normal limits bilateral. Strength 5/5 in all groups bilateral.   Radiographic exam: Normal osseous mineralization. Joint spaces preserved. No fracture/dislocation/boney destruction. No other soft tissue abnormalities or radiopaque foreign bodies.   Assessment: 1. Plantar fasciitis left foot  Plan of Care:  1. Patient evaluated. Xrays reviewed.   2. Injection of 0.5cc Celestone soluspan injected into the left plantar fascia.  3.  Recommend that the patient does not go barefoot around the house and wears good supportive sneakers and shoes 4. Rx for Meloxicam ordered for patient. 5. Instructed patient regarding therapies and modalities at home to alleviate symptoms.  6. Return to clinic in 4 weeks.     Edrick Kins, DPM [facility]  Dr. Edrick Kins, DPM    [address]  [place], [place] [address]                Office [phone]  Fax [phone]

## 2021-01-30 NOTE — Telephone Encounter (Signed)
Result discussed. Neg HIV and RPR.

## 2021-01-31 ENCOUNTER — Other Ambulatory Visit: Payer: Self-pay

## 2021-01-31 ENCOUNTER — Ambulatory Visit: Payer: Medicare Other

## 2021-01-31 DIAGNOSIS — M542 Cervicalgia: Secondary | ICD-10-CM | POA: Diagnosis not present

## 2021-01-31 DIAGNOSIS — R293 Abnormal posture: Secondary | ICD-10-CM | POA: Diagnosis not present

## 2021-01-31 DIAGNOSIS — M6281 Muscle weakness (generalized): Secondary | ICD-10-CM

## 2021-01-31 DIAGNOSIS — R296 Repeated falls: Secondary | ICD-10-CM | POA: Diagnosis not present

## 2021-01-31 DIAGNOSIS — G5601 Carpal tunnel syndrome, right upper limb: Secondary | ICD-10-CM | POA: Diagnosis not present

## 2021-01-31 DIAGNOSIS — M25512 Pain in left shoulder: Secondary | ICD-10-CM

## 2021-01-31 NOTE — Therapy (Signed)
[address], [place], [address] Phone: [phone]   Fax:  [phone]  Physical Therapy Evaluation  Patient Details  Name: Hailey Barnes MRN: [id number] Date of Birth: [birth year] Referring Provider (PT): Gregor Hams   Encounter Date: 01/31/2021   PT End of Session - 01/31/21 1058    Visit Number 1    Number of Visits 13    Date for PT Re-Evaluation 03/16/21    Authorization Type UHC MCR    PT Start Time 1016    PT Stop Time 1100    PT Time Calculation (min) 44 min    Activity Tolerance Patient tolerated treatment well    Behavior During Therapy [facility] for tasks assessed/performed           Past Medical History:  Diagnosis Date  . Arthritis    "knees" (02/21/2014), back  . CHF (congestive heart failure) ([place])   . Chronic bronchitis ([place])    "get it q yr" (02/21/2014)  . Chronic diastolic CHF (congestive heart failure) ([place])    a. Echo 10/23/2016: EF 75 %  . Chronic lower back pain   . Complication of anesthesia    "I don't come out well; I chew on my tongue"  . COPD (chronic obstructive pulmonary disease) ([place])   . Diabetes mellitus without complication ([place])    TYPE 2  . Family history of adverse reaction to anesthesia    BROTHER PONV, had a blood clot several days later and heart stopped  . Fatty liver    NONALCOLIC  . Fibroid   . Heart murmur   . Hypertension   . Infection of right eye    current dx on Saturday 7/15 - using drops  . Leg swelling    bilateral  . Migraine 1982-2009  . MVP (mitral valve prolapse)   . Neuromuscular disorder ([place])    right leg nerve pain  . Shortness of breath    WITH EXERTION  . Sinus pause    a. noted on telemetry during admission from 10/2016, lasting up to 4.4 seconds. BB discontinued.   . Sleep apnea 02/2016   CPAP 16 TO 20  . Stroke [facility]) noted on CAT 02/2014   "light", LLE weakness remains (03/30/2014)  . Substance abuse ([place])    s/p Rehab.  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 Tingling in extremities 12/12/2010   Uric acid and electrolytes (02/23) normal but WBC elevated.   D/Dx: carpal tunnel, ulnar neuropathy Less likely: cervical radiculopathy, vasculitis     Past Surgical History:  Procedure Laterality Date  . [place]  . CHOLECYSTECTOMY N/A 06/17/2017   Procedure: LAPAROSCOPIC CHOLECYSTECTOMY WITH INTRAOPERATIVE CHOLANGIOGRAM;  Surgeon: Donnie Mesa, MD;  Location: [facility];  Service: General;  Laterality: N/A;  . COLONOSCOPY WITH PROPOFOL N/A 04/28/2017   Procedure: COLONOSCOPY WITH PROPOFOL;  Surgeon: Manus Gunning, MD;  Location: [facility];  Service: Gastroenterology;  Laterality: N/A;  . DILATATION & CURETTAGE/HYSTEROSCOPY WITH MYOSURE N/A 05/06/2016   Procedure: DILATATION & CURETTAGE/HYSTEROSCOPY WITH MYOSURE;  Surgeon: Terrance Mass, MD;  Location: [facility];  Service: Gynecology;  Laterality: N/A;  request to follow around 8:45  requests one hour OR time  . DILATATION & CURETTAGE/HYSTEROSCOPY WITH MYOSURE N/A 04/03/2017   Procedure: DILATATION & CURETTAGE/HYSTEROSCOPY WITH MYOSURE;  Surgeon: Terrance Mass, MD;  Location: [facility];  Service: Gynecology;  Laterality: N/A;  . DILATION AND CURETTAGE OF UTERUS    .  ESOPHAGOGASTRODUODENOSCOPY (EGD) WITH PROPOFOL N/A 04/28/2017   Procedure: ESOPHAGOGASTRODUODENOSCOPY (EGD) WITH PROPOFOL;  Surgeon: Manus Gunning, MD;  Location: [facility];  Service: Gastroenterology;  Laterality: N/A;  . FOOT SURGERY Bilateral    "took bones out; put pins in"  . IR ABLATE LIVER CRYOABLATION  02/08/2020  . IR RADIOLOGIST EVAL & MGMT  02/03/2020  . KNEE ARTHROSCOPY Left 08/16/2018   Procedure: ARTHROSCOPY KNEE PARTIAL MEDIAL MENISECTOMY, CHONDROPLASTY MEDIAL AND LATERAL, PLICA RELEASE MEDIAL;  Surgeon: Dorna Leitz, MD;  Location: [facility];  Service: Orthopedics;  Laterality: Left;  . LEFT AND RIGHT HEART CATHETERIZATION WITH CORONARY  ANGIOGRAM N/A 03/31/2014   Procedure: LEFT AND RIGHT HEART CATHETERIZATION WITH CORONARY ANGIOGRAM;  Surgeon: Birdie Riddle, MD;  Location: [facility];  Service: Cardiovascular;  Laterality: N/A;  . MYOMECTOMY  YRS AGO  . sonohystogram  04/14/2016   [facility]: intramural fibroid, premenopausal endometrium, right fluid filled tubular structure    There were no vitals filed for this visit.    Subjective Assessment - 01/31/21 1018    Subjective "I drive the church van. When I was getting in it a few months ago the top of my head hit the roof and caused my neck to turn and I've been having pain in my neck and across my shoulders. It's especially discomforting when I try to sleep. I have back problems too and go and get an epidural injection in my back and had one earlier in the week. They said the medicine may help the upper part, but it may not. I slept and haven't had the pain in the couple days, so I don't know if it's the medicine that's helped me out a couple days. I couldn't move my arm before the shot and now I can." She reports hitting the top of her head which caused her neck to laterally flex to the Lt and felt immediate pain along the Lt upper trap. She describes the pain as stabbing along the back of the neck and top of her shoulders prior to recent lumbar injection. She reports pain at worst 10/10 with laying on the left side, reaching overhead and was relieved with heat and changing positions rated as 5/10. She reports 2 falls in the past 6 months from her feet getting tripped up. She reports numbness in Rt hand, which is related to carpal tunnel. Patient denies any dizziness or vision changes.    Pertinent History see PMH above    Limitations Other (comment)   laying on Lt side,reaching   Diagnostic tests X-ray IMPRESSION:  Degenerative changes without evidence of an acute osseous  abnormality.    Patient Stated Goals pain relief    Currently in Pain? No/denies               [facility] PT Assessment - 01/31/21 0001      Assessment   Medical Diagnosis Bilateral wrist pain  G56.03 (ICD-10-CM) - Carpal tunnel syndrome, bilateral  M25.512 (ICD-10-CM) - Acute pain of left shoulder  M54.2 (ICD-10-CM) - Neck pain on left side    Referring Provider (PT) Gregor Hams    Onset Date/Surgical Date --   neck injury 2 months, carpal tunnel 4-5 months   Hand Dominance Right    Next MD Visit unsure    Prior Therapy none      Precautions   Precautions None      Restrictions   Weight Bearing Restrictions No      Balance Screen  Has the patient fallen in the past 6 months Yes    How many times? 2    Has the patient had a decrease in activity level because of a fear of falling?  Yes    Is the patient reluctant to leave their home because of a fear of falling?  No      Home Environment   Living Environment Private residence    Living Arrangements Alone    Type of [place]      Prior Function   Level of Independence Independent   cleaning takes longer   Vocation On disability    Leisure drives church van      Cognition   Overall Cognitive Status Within Functional Limits for tasks assessed      Observation/Other Assessments   Focus on Therapeutic Outcomes (FOTO)  not setup      Sensation   Light Touch Impaired Detail    Light Touch Impaired Details Impaired RUE   decreased sensation to C6-C8     Coordination   Gross Motor Movements are Fluid and Coordinated Yes      Posture/Postural Control   Posture/Postural Control Postural limitations    Postural Limitations Rounded Shoulders;Forward head;Increased thoracic kyphosis      AROM   Overall AROM Comments bilateral shoulder AROM WNL though bilateral shoulder shrug with active flexion and abduction    Cervical Flexion WFL    Cervical Extension WFL    Cervical - Right Side Bend WFL    Cervical - Left Side Bend WFL    Cervical - Right Rotation 70    Cervical - Left Rotation 40       Strength   Overall Strength Comments gross UE strength 5/5 with exception of Rt wrist flexion/extension 4/5, Lt shoulder abduction 4/5, Lt shoulder flexion 4-/5    Right Hand Grip (lbs) 60    Left Hand Grip (lbs) 60      Palpation   Palpation comment tautness and palpable tenderness bilateral upper traps      Special Tests   Other special tests (-) Cervical compression (-) Cervical Distraction (+) Phalen's      Transfers   Five time sit to stand comments  22 seconds                      Objective measurements completed on examination: See above findings.       [facility] Adult PT Treatment/Exercise - 01/31/21 0001      Self-Care   Self-Care Other Self-Care Comments    Other Self-Care Comments  see patient education                  PT Education - 01/31/21 1340    Education Details Education on current condition, POC, HEP.    Person(s) Educated Patient    Methods Explanation;Demonstration;Tactile cues;Verbal cues;Handout    Comprehension Verbalized understanding;Returned demonstration;Verbal cues required;Tactile cues required;Need further instruction            PT Short Term Goals - 01/31/21 1107      PT SHORT TERM GOAL #1   Title Patient will be independent with intial HEP.    Baseline issued at eval.    Time 3    Period Weeks    Status New    Target Date 02/21/21      PT SHORT TERM GOAL #2   Title PT will capture FOTO and review results/anticipated progress with patient.    Baseline FOTO not  setup at eval.    Time 1    Period --   visit   Status New      PT SHORT TERM GOAL #3   Title Patient will demonstrate at least 60 degrees of Lt cervical rotation to improve ability to complete head turns while driving.    Baseline see flowsheet    Time 3    Period Weeks    Status New    Target Date 02/21/21      PT SHORT TERM GOAL #4   Title Patient will complete complete shoulder AROM without upper trap compensation to reduce stress on  shoulder/neck with overhead activity.    Baseline Full range, though significant upper trap engagement.    Time 3    Period Weeks    Status New    Target Date 02/21/21             PT Long Term Goals - 01/31/21 1351      PT LONG TERM GOAL #1   Title Patient will demonstrate 5/5 Rt wrist strength to improve ability to carry objects.    Baseline see flowsheet    Time 6    Period Weeks    Status New    Target Date 03/14/21      PT LONG TERM GOAL #2   Title Patient will demonstrate 5/5 Lt shoulder strength to improve stability about the shoulder necessary for pulling herself into the church van.    Baseline see flowsheet    Time 6    Period Weeks    Status New    Target Date 03/14/21      PT LONG TERM GOAL #3   Title Patient will report ability to lay on Lt side without worsening of pain.    Baseline unable    Time 6    Period Weeks    Status New    Target Date 03/14/21      PT LONG TERM GOAL #4   Title Patient will complete 5xSTS in </=15 seconds to decrease risk of falls.    Baseline 22 seconds    Time 6    Period Weeks    Status New    Target Date 03/14/21                  Plan - 01/31/21 1359    Clinical Impression Statement Patient is a 60 y/o female who presents to OPPT with chief complaint of acute neck and Lt shoulder pain that began when she hit her head on the roof of the church [employer] when she was getting inside a few months ago as well as Rt carpal tunnel that has worsened over the past 5 months. She reports recently receiving an injection in her low back for chronic low back pain, but this injection has helped to relieve her neck/shoulder pain as well. Overall she has good cervical ROM with exception of left rotation. She has weakness about the Lt shoulder and Rt wrist and has significant compensatory upper trap engagement when performing MMT and AROM about bilateral shoulders. She also reports a history of falls and has a fear of falling. Her 5xSTS score  reveals increased fall risk as well. She should benefit from skilled PT to address above stated deficits in order to return to optimal function.    Personal Factors and Comorbidities Time since onset of injury/illness/exacerbation;Comorbidity 3+    Comorbidities see extensive PMH above    Examination-Activity Limitations Reach Overhead;Sleep  Examination-Participation Restrictions Cleaning    Stability/Clinical Decision Making Stable/Uncomplicated    Clinical Decision Making Low    Rehab Potential Good    PT Frequency 2x / week    PT Duration 6 weeks    PT Treatment/Interventions ADLs/Self Care Home Management;Cryotherapy;Electrical Stimulation;Moist Heat;Traction;Therapeutic activities;Therapeutic exercise;Neuromuscular re-education;Patient/family education;Manual techniques;Passive range of motion;Dry needling;Taping    PT Next Visit Plan capture FOTO (not setup on eval), posture education/body mechanics with reaching, review HEP and update PRN    PT Home Exercise Plan Access Code [email or web address]    Consulted and Agree with Plan of Care Patient           Patient will benefit from skilled therapeutic intervention in order to improve the following deficits and impairments:  Decreased range of motion,Improper body mechanics,Pain,Postural dysfunction,Decreased strength  Visit Diagnosis: Cervicalgia  Acute pain of left shoulder  Carpal tunnel syndrome of right wrist  Muscle weakness (generalized)  Abnormal posture  Repeated falls     Problem List Patient Active Problem List   Diagnosis Date Noted  . Trichomonas vaginitis 01/29/2021  . Melanosis 11/07/2020  . Lesion of cervix 07/05/2020  . Right knee pain 05/07/2020  . Throat pain in adult 04/11/2020  . COPD (chronic obstructive pulmonary disease) ([place]) 02/17/2020  . Impingement syndrome, shoulder, right 08/29/2019  . Hyperuricemia 05/10/2019  . Foot pain, right 05/06/2019  . Nodule of skin of right thumb 04/15/2019  . Leg  cramps 04/01/2019  . Tinea cruris 03/31/2019  . Lack of appetite 03/15/2019  . Wart on thumb 09/23/2018  . Acute medial meniscus tear of left knee 08/16/2018  . Plica of knee, left 14/97/0263  . Chondromalacia, left knee 08/16/2018  . Primary osteoarthritis of both knees 03/24/2018  . Bilateral primary osteoarthritis of first carpometacarpal joints 11/16/2017  . De Quervain's tenosynovitis, right 11/02/2017  . Carpal tunnel syndrome, bilateral 10/21/2017  . Vaginal itching 09/22/2017  . Dysuria 09/22/2017  . High risk sexual behavior 08/27/2017  . Benign neoplasm of sigmoid colon   . Insomnia 02/17/2017  . OSA (obstructive sleep apnea) 10/30/2016  . Sinus pause 10/23/2016  . Panic attack 10/22/2016  . Lower extremity edema   . Left lumbar radiculopathy 06/02/2016  . Hyperlipidemia 02/07/2016  . T2DM (type 2 diabetes mellitus) ([place]) 12/07/2015  . Occipital lymphadenopathy 09/21/2015  . Depression 04/19/2015  . Eczema 08/01/2014  . Diminished vision 06/23/2014  . Chronic diastolic CHF (congestive heart failure) ([place]) 06/13/2014  . Restrictive lung disease 05/10/2014  . Vaginal discharge 03/22/2014  . NASH (nonalcoholic steatohepatitis) 01/04/2014  . Hypertension 05/26/2010  . Morbid obesity ([place]) 12/17/2006  . Tobacco use disorder 12/17/2006   Gwendolyn Grant, PT, DPT, ATC 01/31/21 2:20 PM  [place] [address] [place], [place], [address] Phone: [phone]   Fax:  [phone]  Name: Leandra Vanderweele MRN: [id number] Date of Birth: [birth year]

## 2021-02-10 NOTE — Patient Instructions (Signed)
It was great to see you! Thank you for allowing me to participate in your care!  I recommend that you always bring your medications to each appointment as this makes it easy to ensure we are on the correct medications and helps [place] not miss when refills are needed.  Our plans for today:  -For your blood pressure we start a medication called spironolactone.  We will start this at 25 mg/day.  I would like to have you come back in 1 week to recheck your potassium and kidney function as well as do a blood pressure check. -We are also checking urine today to test for cure for trichomonas.  I will let you know this result when it returns. -I would like for you to continue checking blood pressures at home at least once to twice per day.  Please write these numbers down and bring them at your next appointment when I see you in 1 week.  Take care and seek immediate care sooner if you develop any concerns.   Dr. Lurline Del, [place]

## 2021-02-10 NOTE — Progress Notes (Signed)
    SUBJECTIVE:   CHIEF COMPLAINT / HPI:   Hypertension: Patient is a 60 y.o. female who present today for follow up of hypertension.   Patient endorses no problems  Home medications include: Lisinopril 40 mg daily, hydralazine 75 mg 3 times daily, patient also takes torsemide 20 mg daily as needed for leg swelling Patient endorses taking these medications as prescribed.  Most recent creatinine trend:  Lab Results  Component Value Date   CREATININE 0.98 01/10/2021   CREATININE 1.20 (H) 09/24/2020   CREATININE 1.20 (H) 07/31/2020   Patient does check blood pressure at home. She states they range higher than her pressure here today.   Patient has had a BMP in the past 1 year.  Trichomonas positive in the past Patient also states she would like to do a test of cure for trichomonas.  She was tested positive for this on 01/22/2021 and prescribed metronidazole.  She states she completed the course of this.  PERTINENT  PMH / PSH: Recent positive trichomonas  OBJECTIVE:   BP (!) 142/92   Pulse 78   Ht 5' 2"  (1.575 m)   Wt 261 lb 6.4 oz (118.6 kg)   LMP 04/06/2017   SpO2 96%   BMI 47.81 kg/m    General: NAD, pleasant, able to participate in exam Cardiac: RRR, no murmurs. Respiratory: CTAB, normal effort Psych: Normal affect and mood  ASSESSMENT/PLAN:   Hypertension Assessment: 60 year old female with history of hypertension currently on lisinopril 40 mg/day, hydralazine 75 mg 3 times per day.  Blood pressure today of 140s/90.  Patient had a recent BMP last month that showed creatinine 0.98. Plan: -We will initiate spironolactone 25 mg/day. -We will have patient follow-up in 1 week for blood pressure check and for BMP  Trichomonas vaginitis Patient presents today for test of cure.  She denies any symptoms at this time.  We do not have staff members in order to do a wet prep today so unfortunately we will be limited to checking this via urine cytology.  Patient states she  would actually prefer to have her urine sent to check for it.  Will order urine cytology for trichomonas.    Lurline Del, [place]    This note was prepared using Dragon voice recognition software and may include unintentional dictation errors due to the inherent limitations of voice recognition software.

## 2021-02-11 ENCOUNTER — Other Ambulatory Visit (HOSPITAL_COMMUNITY)
Admission: RE | Admit: 2021-02-11 | Discharge: 2021-02-11 | Disposition: A | Discharge status: Home / Self Care | Payer: Medicare Other | Source: Ambulatory Visit | Attending: Family Medicine | Admitting: Family Medicine

## 2021-02-11 ENCOUNTER — Encounter: Payer: Self-pay | Admitting: Family Medicine

## 2021-02-11 ENCOUNTER — Other Ambulatory Visit: Payer: Self-pay

## 2021-02-11 ENCOUNTER — Ambulatory Visit (INDEPENDENT_AMBULATORY_CARE_PROVIDER_SITE_OTHER): Payer: Medicare Other | Admitting: Family Medicine

## 2021-02-11 VITALS — BP 142/92 | HR 78 | Ht 62.0 in | Wt 261.4 lb

## 2021-02-11 DIAGNOSIS — A5901 Trichomonal vulvovaginitis: Secondary | ICD-10-CM | POA: Diagnosis not present

## 2021-02-11 DIAGNOSIS — I10 Essential (primary) hypertension: Secondary | ICD-10-CM | POA: Diagnosis not present

## 2021-02-11 DIAGNOSIS — Z113 Encounter for screening for infections with a predominantly sexual mode of transmission: Secondary | ICD-10-CM

## 2021-02-11 MED ORDER — SPIRONOLACTONE 25 MG PO TABS: 25.0000 mg | ORAL_TABLET | Freq: Every day | ORAL | 3 refills | Status: DC

## 2021-02-11 NOTE — Assessment & Plan Note (Signed)
Assessment: 60 year old female with history of hypertension currently on lisinopril 40 mg/day, hydralazine 75 mg 3 times per day.  Blood pressure today of 140s/90.  Patient had a recent BMP last month that showed creatinine 0.98. Plan: -We will initiate spironolactone 25 mg/day. -We will have patient follow-up in 1 week for blood pressure check and for BMP

## 2021-02-11 NOTE — Assessment & Plan Note (Signed)
Patient presents today for test of cure.  She denies any symptoms at this time.  We do not have staff members in order to do a wet prep today so unfortunately we will be limited to checking this via urine cytology.  Patient states she would actually prefer to have her urine sent to check for it.  Will order urine cytology for trichomonas.

## 2021-02-12 LAB — URINE CYTOLOGY ANCILLARY ONLY
Chlamydia: NEGATIVE
Comment: NEGATIVE
Comment: NEGATIVE
Comment: NORMAL
Neisseria Gonorrhea: NEGATIVE
Trichomonas: NEGATIVE

## 2021-02-20 ENCOUNTER — Encounter: Payer: Self-pay | Admitting: Physical Therapy

## 2021-02-20 ENCOUNTER — Ambulatory Visit: Payer: Medicare Other | Attending: Family Medicine | Admitting: Physical Therapy

## 2021-02-20 ENCOUNTER — Other Ambulatory Visit: Payer: Self-pay

## 2021-02-20 DIAGNOSIS — R293 Abnormal posture: Secondary | ICD-10-CM | POA: Insufficient documentation

## 2021-02-20 DIAGNOSIS — R296 Repeated falls: Secondary | ICD-10-CM | POA: Insufficient documentation

## 2021-02-20 DIAGNOSIS — M6281 Muscle weakness (generalized): Secondary | ICD-10-CM | POA: Diagnosis not present

## 2021-02-20 DIAGNOSIS — M25512 Pain in left shoulder: Secondary | ICD-10-CM | POA: Diagnosis not present

## 2021-02-20 DIAGNOSIS — G5601 Carpal tunnel syndrome, right upper limb: Secondary | ICD-10-CM | POA: Insufficient documentation

## 2021-02-20 DIAGNOSIS — M542 Cervicalgia: Secondary | ICD-10-CM | POA: Insufficient documentation

## 2021-02-20 NOTE — Therapy (Addendum)
[place], [place], [address] Phone: [phone]   Fax:  [phone]  Physical Therapy Treatment  Patient Details  Name: Hailey Barnes MRN: [id number] Date of Birth: [birth year] Referring Provider (PT): Gregor Hams   Encounter Date: 02/20/2021   PT End of Session - 02/20/21 1059    Visit Number 2    Number of Visits 13    Date for PT Re-Evaluation 03/16/21    Authorization Type UHC MCR    PT Start Time 5053    PT Stop Time 1057    PT Time Calculation (min) 42 min    Activity Tolerance Patient tolerated treatment well    Behavior During Therapy [facility] for tasks assessed/performed           Past Medical History:  Diagnosis Date  . Arthritis    "knees" (02/21/2014), back  . CHF (congestive heart failure) ([place])   . Chronic bronchitis ([place])    "get it q yr" (02/21/2014)  . Chronic diastolic CHF (congestive heart failure) ([place])    a. Echo 10/23/2016: EF 75 %  . Chronic lower back pain   . Complication of anesthesia    "I don't come out well; I chew on my tongue"  . COPD (chronic obstructive pulmonary disease) ([place])   . Diabetes mellitus without complication ([place])    TYPE 2  . Family history of adverse reaction to anesthesia    BROTHER PONV, had a blood clot several days later and heart stopped  . Fatty liver    NONALCOLIC  . Fibroid   . Heart murmur   . Hypertension   . Infection of right eye    current dx on Saturday 7/15 - using drops  . Leg swelling    bilateral  . Migraine 1982-2009  . MVP (mitral valve prolapse)   . Neuromuscular disorder ([place])    right leg nerve pain  . Shortness of breath    WITH EXERTION  . Sinus pause    a. noted on telemetry during admission from 10/2016, lasting up to 4.4 seconds. BB discontinued.   . Sleep apnea 02/2016   CPAP 16 TO 20  . Stroke [facility]) noted on CAT 02/2014   "light", LLE weakness remains (03/30/2014)  . 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 Tingling in extremities 12/12/2010   Uric acid and electrolytes (02/23) normal but WBC elevated.   D/Dx: carpal tunnel, ulnar neuropathy Less likely: cervical radiculopathy, vasculitis     Past Surgical History:  Procedure Laterality Date  . [place]  . CHOLECYSTECTOMY N/A 06/17/2017   Procedure: LAPAROSCOPIC CHOLECYSTECTOMY WITH INTRAOPERATIVE CHOLANGIOGRAM;  Surgeon: Donnie Mesa, MD;  Location: [facility];  Service: General;  Laterality: N/A;  . COLONOSCOPY WITH PROPOFOL N/A 04/28/2017   Procedure: COLONOSCOPY WITH PROPOFOL;  Surgeon: Manus Gunning, MD;  Location: [facility];  Service: Gastroenterology;  Laterality: N/A;  . DILATATION & CURETTAGE/HYSTEROSCOPY WITH MYOSURE N/A 05/06/2016   Procedure: DILATATION & CURETTAGE/HYSTEROSCOPY WITH MYOSURE;  Surgeon: Terrance Mass, MD;  Location: [facility];  Service: Gynecology;  Laterality: N/A;  request to follow around 8:45  requests one hour OR time  . DILATATION & CURETTAGE/HYSTEROSCOPY WITH MYOSURE N/A 04/03/2017   Procedure: DILATATION & CURETTAGE/HYSTEROSCOPY WITH MYOSURE;  Surgeon: Terrance Mass, MD;  Location: [facility];  Service: Gynecology;  Laterality: N/A;  . DILATION AND CURETTAGE OF UTERUS    .  ESOPHAGOGASTRODUODENOSCOPY (EGD) WITH PROPOFOL N/A 04/28/2017   Procedure: ESOPHAGOGASTRODUODENOSCOPY (EGD) WITH PROPOFOL;  Surgeon: Manus Gunning, MD;  Location: [facility];  Service: Gastroenterology;  Laterality: N/A;  . FOOT SURGERY Bilateral    "took bones out; put pins in"  . IR ABLATE LIVER CRYOABLATION  02/08/2020  . IR RADIOLOGIST EVAL & MGMT  02/03/2020  . KNEE ARTHROSCOPY Left 08/16/2018   Procedure: ARTHROSCOPY KNEE PARTIAL MEDIAL MENISECTOMY, CHONDROPLASTY MEDIAL AND LATERAL, PLICA RELEASE MEDIAL;  Surgeon: Dorna Leitz, MD;  Location: [facility];  Service: Orthopedics;  Laterality: Left;  . LEFT AND RIGHT HEART CATHETERIZATION WITH CORONARY  ANGIOGRAM N/A 03/31/2014   Procedure: LEFT AND RIGHT HEART CATHETERIZATION WITH CORONARY ANGIOGRAM;  Surgeon: Birdie Riddle, MD;  Location: [facility];  Service: Cardiovascular;  Laterality: N/A;  . MYOMECTOMY  YRS AGO  . sonohystogram  04/14/2016   [facility]: intramural fibroid, premenopausal endometrium, right fluid filled tubular structure    There were no vitals filed for this visit.   Subjective Assessment - 02/20/21 1014    Subjective Pt reports she is doing well. The injection in her back has helped in all her pain. The only time it bothers her is when she is sidelying on the right side and her R shoulder bothers her then.    Currently in Pain? No/denies    Pain Score 0-No pain              [facility] PT Assessment - 02/20/21 0001      AROM   Cervical - Right Rotation 60    Cervical - Left Rotation 70                         [facility] Adult PT Treatment/Exercise - 02/20/21 0001      Self-Care   Self-Care Other Self-Care Comments    Other Self-Care Comments  education on positioning while pt sleeps on their side      Exercises   Exercises Neck;Shoulder      Neck Exercises: Machines for Strengthening   UBE (Upper Arm Bike) 2 min forward, 1 min backward   pt reported pain going backward so discontinued     Neck Exercises: Seated   Lateral Flexion Right;Left   2x30 sec   Lateral Flexion Limitations Upper trap and levator stretch    Other Seated Exercise scapular retraction 2x10 5 sec hold    Other Seated Exercise cervicle rotation with towel 3x20 sec      Neck Exercises: Supine   Neck Retraction 10 reps;3 secs   2 sets, cues for keeping head down     Shoulder Exercises: Seated   Other Seated Exercises AROM L shoulder flexion and abduction 10 x      Hand Exercises for Cervical Radiculopathy   Other Hand Exercise for Cervical Radiculopathy wrist flex/ext 2x10 with green theraband    Other Hand Exercise for Cervical Radiculopathy wrist  flex/ext stretch 2x30sec      Manual Therapy   Manual Therapy Soft tissue mobilization    Manual therapy comments trigger point release to upper trap                    PT Short Term Goals - 02/20/21 1030      PT SHORT TERM GOAL #1   Title Patient will be independent with intial HEP.    Baseline independent with initial exercises    Time 3    Period Weeks  Status On-going    Target Date 02/21/21      PT SHORT TERM GOAL #2   Title PT will capture FOTO and review results/anticipated progress with patient.    Baseline FOTO not setup at eval, FOTO not set up at first treatment    Time 1    Status Deferred      PT SHORT TERM GOAL #3   Title Patient will demonstrate at least 60 degrees of Lt cervical rotation to improve ability to complete head turns while driving.    Baseline pt was able to complete 70 L cervical rotation    Time 3    Period Weeks    Status Achieved    Target Date 02/21/21      PT SHORT TERM GOAL #4   Title Patient will complete complete shoulder AROM without upper trap compensation to reduce stress on shoulder/neck with overhead activity.    Baseline Pt able to complete 10 x shoulder flexion and abduction without upper trap engagement.    Time 3    Period Weeks    Status Achieved    Target Date 02/21/21             PT Long Term Goals - 01/31/21 1351      PT LONG TERM GOAL #1   Title Patient will demonstrate 5/5 Rt wrist strength to improve ability to carry objects.    Baseline see flowsheet    Time 6    Period Weeks    Status New    Target Date 03/14/21      PT LONG TERM GOAL #2   Title Patient will demonstrate 5/5 Lt shoulder strength to improve stability about the shoulder necessary for pulling herself into the church van.    Baseline see flowsheet    Time 6    Period Weeks    Status New    Target Date 03/14/21      PT LONG TERM GOAL #3   Title Patient will report ability to lay on Lt side without worsening of pain.     Baseline unable    Time 6    Period Weeks    Status New    Target Date 03/14/21      PT LONG TERM GOAL #4   Title Patient will complete 5xSTS in </=15 seconds to decrease risk of falls.    Baseline 22 seconds    Time 6    Period Weeks    Status New    Target Date 03/14/21                 Plan - 02/20/21 1100    Clinical Impression Statement Pt reports she is doing well and has been having no pain since her injection. The only time her shoulder bothers her is in sidelying on the right side and the right shoulder is the one that bothers her. STM and TPR was done to B upper trap to decrease any tightness. Pt was able to complete all exercises today without adverse affects. Pt did report pain on UBE going backwards so exercise was stopped. She met STG #3 and 4 increasing her cervical ROM to the L to 70 degrees and by completing L shouler AROM flexion and abduction without upper trap engagement.Pt reported feeling well following treatment.    PT Treatment/Interventions ADLs/Self Care Home Management;Cryotherapy;Electrical Stimulation;Moist Heat;Traction;Therapeutic activities;Therapeutic exercise;Neuromuscular re-education;Patient/family education;Manual techniques;Passive range of motion;Dry needling;Taping    PT Next Visit Plan posture education/body mechanics with reaching, review  HEP and update PRN    PT Home Exercise Plan Access Code [email or web address]           Patient will benefit from skilled therapeutic intervention in order to improve the following deficits and impairments:  Decreased range of motion,Improper body mechanics,Pain,Postural dysfunction,Decreased strength  Visit Diagnosis: Cervicalgia  Acute pain of left shoulder  Carpal tunnel syndrome of right wrist  Muscle weakness (generalized)  Abnormal posture  Repeated falls     Problem List Patient Active Problem List   Diagnosis Date Noted  . Trichomonas vaginitis 01/29/2021  . Melanosis 11/07/2020  . Lesion of  cervix 07/05/2020  . Right knee pain 05/07/2020  . Throat pain in adult 04/11/2020  . COPD (chronic obstructive pulmonary disease) ([place]) 02/17/2020  . Impingement syndrome, shoulder, right 08/29/2019  . Hyperuricemia 05/10/2019  . Foot pain, right 05/06/2019  . Nodule of skin of right thumb 04/15/2019  . Leg cramps 04/01/2019  . Tinea cruris 03/31/2019  . Lack of appetite 03/15/2019  . Wart on thumb 09/23/2018  . Acute medial meniscus tear of left knee 08/16/2018  . Plica of knee, left 13/14/3888  . Chondromalacia, left knee 08/16/2018  . Primary osteoarthritis of both knees 03/24/2018  . Bilateral primary osteoarthritis of first carpometacarpal joints 11/16/2017  . De Quervain's tenosynovitis, right 11/02/2017  . Carpal tunnel syndrome, bilateral 10/21/2017  . Vaginal itching 09/22/2017  . Dysuria 09/22/2017  . High risk sexual behavior 08/27/2017  . Benign neoplasm of sigmoid colon   . Insomnia 02/17/2017  . OSA (obstructive sleep apnea) 10/30/2016  . Sinus pause 10/23/2016  . Panic attack 10/22/2016  . Lower extremity edema   . Left lumbar radiculopathy 06/02/2016  . Hyperlipidemia 02/07/2016  . T2DM (type 2 diabetes mellitus) ([place]) 12/07/2015  . Occipital lymphadenopathy 09/21/2015  . Depression 04/19/2015  . Eczema 08/01/2014  . Diminished vision 06/23/2014  . Chronic diastolic CHF (congestive heart failure) ([place]) 06/13/2014  . Restrictive lung disease 05/10/2014  . Vaginal discharge 03/22/2014  . NASH (nonalcoholic steatohepatitis) 01/04/2014  . Hypertension 05/26/2010  . Morbid obesity ([place]) 12/17/2006  . Tobacco use disorder 12/17/2006    Wyman Songster 02/20/2021, 1:13 PM  [facility] [address] [place], [place], [address] Phone: [phone]   Fax:  [phone]  Name: Hailey Barnes MRN: [id number] Date of Birth: Jan 21, 1961

## 2021-02-21 NOTE — Progress Notes (Signed)
    SUBJECTIVE:   CHIEF COMPLAINT / HPI:   Follow-up-hypertension: Patient is a 60 year old female that presents today for follow-up after initiating spironolactone for hypertension. She is currently controlled on lisinopril 40 mg daily, hydralazine 75 mg 3 times per day, and spironolactone 25 mg/day.  We checked a BMP recently which showed creatinine within normal limits.  Our plan was to recheck a BMP today and see how her potassium and creatinine are doing as well as to evaluate her blood pressure and see how her symptoms are doing after starting the new medication.  Today she states she has been taking blood pressure medicine as usual and has been checking her blood pressure at home with a new blood pressure cuff and has been seeing numbers in the 932I/712W systolic.Marland Kitchen  PERTINENT  PMH / PSH: History of hypertension  OBJECTIVE:   BP (!) 168/99   Pulse 75   Ht 5' 2"  (1.575 m)   Wt 268 lb (121.6 kg)   LMP 04/06/2017   SpO2 98%   BMI 49.02 kg/m    Blood pressure recheck 150/90.  General: NAD, pleasant, able to participate in exam Respiratory: No respiratory distress Skin: warm and dry, no rashes noted Psych: Normal affect and mood  ASSESSMENT/PLAN:   Hypertension Assessment: 60 year old female with a history of hypertension who is currently on lisinopril 40 mg daily, hydralazine 75 mg 3 times daily, and spironolactone 25 mg daily.  Her blood pressure continues to be elevated with the last reading of 150/90 in office today.  We will check a BMP today to ensure that her kidney function and potassium is appropriate and if so we will call her to discuss increasing her spironolactone to 50 mg daily.    Lurline Del, [place]    This note was prepared using Dragon voice recognition software and may include unintentional dictation errors due to the inherent limitations of voice recognition software.

## 2021-02-21 NOTE — Patient Instructions (Signed)
It was great to see you! Thank you for allowing me to participate in your care!  I recommend that you always bring your medications to each appointment as this makes it easy to ensure we are on the correct medications and helps [place] not miss when refills are needed.  Our plans for today:  -Your blood pressure was 150/90 today.  I want you to continue checking your blood pressures at home at least once or twice per day.  Rest at least 5 minutes before you check it to get an accurate reading and write these numbers down.  I would not feel like a follow-up appointment with me in about 1-2 weeks and bring the list of blood pressure numbers as well as her blood pressure cuff. -We are going check some blood work today and I will call you to discuss next steps after we get back the results of your blood work.  Take care and seek immediate care sooner if you develop any concerns.   Dr. Lurline Del, [place]

## 2021-02-22 ENCOUNTER — Other Ambulatory Visit: Payer: Self-pay

## 2021-02-22 ENCOUNTER — Ambulatory Visit: Payer: Medicare Other | Admitting: Physical Therapy

## 2021-02-22 ENCOUNTER — Encounter: Payer: Self-pay | Admitting: Physical Therapy

## 2021-02-22 ENCOUNTER — Encounter: Payer: Self-pay | Admitting: Family Medicine

## 2021-02-22 ENCOUNTER — Ambulatory Visit (INDEPENDENT_AMBULATORY_CARE_PROVIDER_SITE_OTHER): Payer: Medicare Other | Admitting: Family Medicine

## 2021-02-22 VITALS — BP 168/99 | HR 75 | Ht 62.0 in | Wt 268.0 lb

## 2021-02-22 DIAGNOSIS — M542 Cervicalgia: Secondary | ICD-10-CM | POA: Diagnosis not present

## 2021-02-22 DIAGNOSIS — M6281 Muscle weakness (generalized): Secondary | ICD-10-CM

## 2021-02-22 DIAGNOSIS — M25512 Pain in left shoulder: Secondary | ICD-10-CM

## 2021-02-22 DIAGNOSIS — I10 Essential (primary) hypertension: Secondary | ICD-10-CM

## 2021-02-22 DIAGNOSIS — R293 Abnormal posture: Secondary | ICD-10-CM | POA: Diagnosis not present

## 2021-02-22 DIAGNOSIS — G5601 Carpal tunnel syndrome, right upper limb: Secondary | ICD-10-CM

## 2021-02-22 DIAGNOSIS — R296 Repeated falls: Secondary | ICD-10-CM

## 2021-02-22 NOTE — Assessment & Plan Note (Signed)
Assessment: 60 year old female with a history of hypertension who is currently on lisinopril 40 mg daily, hydralazine 75 mg 3 times daily, and spironolactone 25 mg daily.  Her blood pressure continues to be elevated with the last reading of 150/90 in office today.  We will check a BMP today to ensure that her kidney function and potassium is appropriate and if so we will call her to discuss increasing her spironolactone to 50 mg daily.

## 2021-02-22 NOTE — Progress Notes (Deleted)
    Hailey Barnes is a 60 y.o. female who presents to [place] at [facility] today for f/u of neck and L shoulder pain.  She was last seen by Dr. [name] on 01/14/21 and advised to do PT as previously suggested.  Pt has completed 2 PT sessions.  She was also prescribed tizanidine.  Since her last visit, pt reports  Diagnostic testing: C-spine XR- 01/14/21   Pertinent review of systems: ***  Relevant historical information: ***   Exam:  LMP 04/06/2017  General: Well Developed, well nourished, and in no acute distress.   MSK: ***    Lab and Radiology Results No results found for this or any previous visit (from the past 72 hour(s)). No results found.     Assessment and Plan: 60 y.o. female with ***   PDMP not reviewed this encounter. No orders of the defined types were placed in this encounter.  No orders of the defined types were placed in this encounter.    Discussed warning signs or symptoms. Please see discharge instructions. Patient expresses understanding.   ***

## 2021-02-22 NOTE — Therapy (Addendum)
[facility] [address] [place], [place], [address] Phone: [phone]   Fax:  [phone]  Physical Therapy Treatment/Discharge   Patient Details  Name: Hailey Barnes MRN: [id number] Date of Birth: 09/21/1961 Referring Provider (PT): Corey, Evan S   Encounter Date: 02/22/2021   PT End of Session - 02/22/21 1058     Visit Number 3    Number of Visits 13    Date for PT Re-Evaluation 03/16/21    Authorization Type UHC MCR    PT Start Time 1015    PT Stop Time 1058    PT Time Calculation (min) 43 min    Activity Tolerance Patient tolerated treatment well    Behavior During Therapy WFL for tasks assessed/performed             Past Medical History:  Diagnosis Date   Arthritis    "knees" (02/21/2014), back   CHF (congestive heart failure) (HCC)    Chronic bronchitis (HCC)    "get it q yr" (02/21/2014)   Chronic diastolic CHF (congestive heart failure) (HCC)    a. Echo 10/23/2016: EF 75 %   Chronic lower back pain    Complication of anesthesia    "I don't come out well; I chew on my tongue"   COPD (chronic obstructive pulmonary disease) (HCC)    Diabetes mellitus without complication (HCC)    TYPE 2   Family history of adverse reaction to anesthesia    BROTHER PONV, had a blood clot several days later and heart stopped   Fatty liver    NONALCOLIC   Fibroid    Heart murmur    Hypertension    Infection of right eye    current dx on Saturday 7/15 - using drops   Leg swelling    bilateral   Migraine 1982-2009   MVP (mitral valve prolapse)    Neuromuscular disorder (HCC)    right leg nerve pain   Shortness of breath    WITH EXERTION   Sinus pause    a. noted on telemetry during admission from 10/2016, lasting up to 4.4 seconds. BB discontinued.    Sleep apnea 02/2016   CPAP 16 TO 20   Stroke (HCC) noted on CAT 02/2014   "light", LLE weakness remains (03/30/2014)   Substance abuse (HCC)    s/p Rehab. Now in  remission since Summer 2011. 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   D/Dx: carpal tunnel, ulnar neuropathy Less likely: cervical radiculopathy, vasculitis     Past Surgical History:  Procedure Laterality Date   CESAREAN SECTION  1982   CHOLECYSTECTOMY N/A 06/17/2017   Procedure: LAPAROSCOPIC CHOLECYSTECTOMY WITH INTRAOPERATIVE CHOLANGIOGRAM;  Surgeon: Tsuei, Matthew, MD;  Location: [facility] OR;  Service: General;  Laterality: N/A;   COLONOSCOPY WITH PROPOFOL N/A 04/28/2017   Procedure: COLONOSCOPY WITH PROPOFOL;  Surgeon: Armbruster, Steven Paul, MD;  Location: [facility];  Service: Gastroenterology;  Laterality: N/A;   DILATATION & CURETTAGE/HYSTEROSCOPY WITH MYOSURE N/A 05/06/2016   Procedure: DILATATION & CURETTAGE/HYSTEROSCOPY WITH MYOSURE;  Surgeon: Juan H Fernandez, MD;  Location: [facility] OR[facility];  Service: Gynecology;  Laterality: N/A;  request to follow around 8:45  requests one hour OR time   DILATATION & CURETTAGE/HYSTEROSCOPY WITH MYOSURE N/A 04/03/2017   Procedure: DILATATION & CURETTAGE/HYSTEROSCOPY WITH MYOSURE;  Surgeon: Fernandez, Juan H, MD;  Location: [facility];  Service: Gynecology;  Laterality: N/A;   DILATION AND   CURETTAGE OF UTERUS     ESOPHAGOGASTRODUODENOSCOPY (EGD) WITH PROPOFOL N/A 04/28/2017   Procedure: ESOPHAGOGASTRODUODENOSCOPY (EGD) WITH PROPOFOL;  Surgeon: Armbruster, Steven Paul, MD;  Location: [facility];  Service: Gastroenterology;  Laterality: N/A;   FOOT SURGERY Bilateral    "took bones out; put pins in"   IR ABLATE LIVER CRYOABLATION  02/08/2020   IR RADIOLOGIST EVAL & MGMT  02/03/2020   KNEE ARTHROSCOPY Left 08/16/2018   Procedure: ARTHROSCOPY KNEE PARTIAL MEDIAL MENISECTOMY, CHONDROPLASTY MEDIAL AND LATERAL, PLICA RELEASE MEDIAL;  Surgeon: Graves, John, MD;  Location: [facility] OR;  Service: Orthopedics;  Laterality: Left;   LEFT AND RIGHT HEART CATHETERIZATION WITH CORONARY ANGIOGRAM N/A  03/31/2014   Procedure: LEFT AND RIGHT HEART CATHETERIZATION WITH CORONARY ANGIOGRAM;  Surgeon: Ajay S Kadakia, MD;  Location: [facility] CATH LAB;  Service: Cardiovascular;  Laterality: N/A;   MYOMECTOMY  YRS AGO   sonohystogram  04/14/2016   [facility]: intramural fibroid, premenopausal endometrium, right fluid filled tubular structure    There were no vitals filed for this visit.   Subjective Assessment - 02/22/21 1012     Subjective Pt reports she is doing well. She still has shoulder pain while laying down and she only has pain at night time.    Currently in Pain? No/denies    Pain Score 0-No pain                               [facility] Adult PT Treatment/Exercise - 02/22/21 0001       Neck Exercises: Machines for Strengthening   UBE (Upper Arm Bike) 2 min forward, 1 min backward   pt reported pain going backward so discontinued exercise     Neck Exercises: Seated   Lateral Flexion Right;Left   2x30 sec   Lateral Flexion Limitations Upper trap and levator stretch    Other Seated Exercise scapular retraction 2x10 5 sec hold      Neck Exercises: Supine   Neck Retraction 10 reps;3 secs   2 sets, cues for keeping head down     Shoulder Exercises: Supine   Flexion 10 reps;AROM   pt reports pain at end range     Shoulder Exercises: Seated   Other Seated Exercises scapular retraction 2x10      Shoulder Exercises: Sidelying   ABduction 20 reps;AROM   pain at end range     Shoulder Exercises: Standing   Extension 20 reps;Both   red theraband   Row 20 reps;Both   red theraband   Other Standing Exercises front raise 2x10 1#, lateral raise 2x10 1#    Other Standing Exercises bicep curls 2x10 3#   cues for posture     Hand Exercises for Cervical Radiculopathy   Other Hand Exercise for Cervical Radiculopathy wrist flex/ext 2x10 with green theraband    Other Hand Exercise for Cervical Radiculopathy wrist flex/ext stretch 2x30sec      Manual Therapy    Manual Therapy Soft tissue mobilization    Manual therapy comments PROM into flexion with trigger point release to middle deltoid                      PT Short Term Goals - 02/20/21 1030       PT SHORT TERM GOAL #1   Title Patient will be independent with intial HEP.    Baseline independent with initial exercises    Time 3    Period   Weeks    Status On-going    Target Date 02/21/21      PT SHORT TERM GOAL #2   Title PT will capture FOTO and review results/anticipated progress with patient.    Baseline FOTO not setup at eval, FOTO not set up at first treatment    Time 1    Status Deferred      PT SHORT TERM GOAL #3   Title Patient will demonstrate at least 60 degrees of Lt cervical rotation to improve ability to complete head turns while driving.    Baseline pt was able to complete 70 L cervical rotation    Time 3    Period Weeks    Status Achieved    Target Date 02/21/21      PT SHORT TERM GOAL #4   Title Patient will complete complete shoulder AROM without upper trap compensation to reduce stress on shoulder/neck with overhead activity.    Baseline Pt able to complete 10 x shoulder flexion and abduction without upper trap engagement.    Time 3    Period Weeks    Status Achieved    Target Date 02/21/21               PT Long Term Goals - 01/31/21 1351       PT LONG TERM GOAL #1   Title Patient will demonstrate 5/5 Rt wrist strength to improve ability to carry objects.    Baseline see flowsheet    Time 6    Period Weeks    Status New    Target Date 03/14/21      PT LONG TERM GOAL #2   Title Patient will demonstrate 5/5 Lt shoulder strength to improve stability about the shoulder necessary for pulling herself into the church van.    Baseline see flowsheet    Time 6    Period Weeks    Status New    Target Date 03/14/21      PT LONG TERM GOAL #3   Title Patient will report ability to lay on Lt side without worsening of pain.    Baseline  unable    Time 6    Period Weeks    Status New    Target Date 03/14/21      PT LONG TERM GOAL #4   Title Patient will complete 5xSTS in </=15 seconds to decrease risk of falls.    Baseline 22 seconds    Time 6    Period Weeks    Status New    Target Date 03/14/21                   Plan - 02/22/21 1239     Clinical Impression Statement Pt reports she is feeling well today. Pt continued strengthening her shoulder and wrist. STM/TPR and PROM was completed to help decrease shoulder pain during AROM. She was able to complete all exercises without any adverse effects following treatment.    PT Treatment/Interventions ADLs/Self Care Home Management;Cryotherapy;Electrical Stimulation;Moist Heat;Traction;Therapeutic activities;Therapeutic exercise;Neuromuscular re-education;Patient/family education;Manual techniques;Passive range of motion;Dry needling;Taping    PT Next Visit Plan posture education/body mechanics with reaching, review HEP and update PRN    PT Home Exercise Plan Access Code [email or web address]             Patient will benefit from skilled therapeutic intervention in order to improve the following deficits and impairments:  Decreased range of motion,Improper body mechanics,Pain,Postural dysfunction,Decreased strength  Visit Diagnosis: Cervicalgia  Acute pain of left   shoulder  Carpal tunnel syndrome of right wrist  Muscle weakness (generalized)  Abnormal posture  Repeated falls     Problem List 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   Lesion of cervix 07/05/2020   COPD (chronic obstructive pulmonary disease) ([place]) 82/50/0370   Plica of knee, left 48/88/9169   Chondromalacia, left knee 08/16/2018   Primary osteoarthritis of both knees 03/24/2018   Bilateral primary osteoarthritis of first carpometacarpal joints 11/16/2017   OSA (obstructive sleep apnea) 10/30/2016   Left lumbar radiculopathy 06/02/2016   Hyperlipidemia  02/07/2016   T2DM (type 2 diabetes mellitus) ([place]) 12/07/2015   Depression 04/19/2015   Diminished vision 06/23/2014   Chronic diastolic CHF (congestive heart failure) ([place]) 06/13/2014   Restrictive lung disease 05/10/2014   NASH (nonalcoholic steatohepatitis) 01/04/2014   Hypertension 05/26/2010   Morbid obesity ([place]) 12/17/2006   Tobacco use disorder 12/17/2006    Dawayne Cirri, SPTA 02/22/2021, 12:46 PM  PHYSICAL THERAPY DISCHARGE SUMMARY  Visits from Start of Care: 3  Current functional level related to goals / functional outcomes: See goals above   Remaining deficits: Status unknown   Education / Equipment: N/A   Patient agrees to discharge. Patient goals were partially met. Patient is being discharged due to not returning since the last visit.  Gwendolyn Grant, PT, DPT, ATC 04/23/21 10:00 AM  [facility] [address] [place], [place], [address] Phone: [phone]   Fax:  [phone]  Name: Hailey Barnes MRN: [id number] Date of Birth: [birth year]

## 2021-02-23 ENCOUNTER — Other Ambulatory Visit: Payer: Self-pay | Admitting: Family Medicine

## 2021-02-23 ENCOUNTER — Telehealth: Payer: Self-pay | Admitting: Family Medicine

## 2021-02-23 DIAGNOSIS — I10 Essential (primary) hypertension: Secondary | ICD-10-CM

## 2021-02-23 LAB — BASIC METABOLIC PANEL
BUN/Creatinine Ratio: 17 (ref 9–23)
BUN: 18 mg/dL (ref 6–24)
CO2: 21 mmol/L (ref 20–29)
Calcium: 9.6 mg/dL (ref 8.7–10.2)
Chloride: 102 mmol/L (ref 96–106)
Creatinine, Ser: 1.09 mg/dL — ABNORMAL HIGH (ref 0.57–1.00)
Glucose: 90 mg/dL (ref 65–99)
Potassium: 4.2 mmol/L (ref 3.5–5.2)
Sodium: 142 mmol/L (ref 134–144)
eGFR: 59 mL/min/{1.73_m2} — ABNORMAL LOW (ref >59)

## 2021-02-23 NOTE — Telephone Encounter (Signed)
Called patient to discuss her BMP, the plan was to increase her spironolactone to 44m per day if her potassium and creatinine looked appropriate. They do and so we discussed making this change starting tomorrow morning. I created a lab-only visit for repeat BMP on Wednesday at 11:45am for her and will see her on Friday at our prescheduled visit for BP check. I have ordered a future BMP for her Wednesday lab visit.

## 2021-02-25 ENCOUNTER — Ambulatory Visit: Payer: Medicare Other

## 2021-02-25 ENCOUNTER — Ambulatory Visit: Payer: Medicare Other | Admitting: Family Medicine

## 2021-02-27 ENCOUNTER — Other Ambulatory Visit: Payer: Medicare Other

## 2021-02-27 ENCOUNTER — Telehealth: Payer: Self-pay

## 2021-02-27 ENCOUNTER — Other Ambulatory Visit: Payer: Self-pay

## 2021-02-27 ENCOUNTER — Ambulatory Visit: Payer: Medicare Other

## 2021-02-27 DIAGNOSIS — Z1231 Encounter for screening mammogram for malignant neoplasm of breast: Secondary | ICD-10-CM | POA: Diagnosis not present

## 2021-02-27 DIAGNOSIS — I10 Essential (primary) hypertension: Secondary | ICD-10-CM | POA: Diagnosis not present

## 2021-02-27 NOTE — Telephone Encounter (Signed)
Left voicemail notifying patient of missed PT appointment. Reminded patient of next scheduled visit and to call back if she needs to cancel or reschedule.

## 2021-02-28 LAB — BASIC METABOLIC PANEL
BUN/Creatinine Ratio: 14 (ref 9–23)
BUN: 12 mg/dL (ref 6–24)
CO2: 23 mmol/L (ref 20–29)
Calcium: 9.2 mg/dL (ref 8.7–10.2)
Chloride: 105 mmol/L (ref 96–106)
Creatinine, Ser: 0.87 mg/dL (ref 0.57–1.00)
Glucose: 90 mg/dL (ref 65–99)
Potassium: 4.3 mmol/L (ref 3.5–5.2)
Sodium: 140 mmol/L (ref 134–144)
eGFR: 78 mL/min/{1.73_m2} (ref >59)

## 2021-02-28 NOTE — Patient Instructions (Signed)
It was great to see you! Thank you for allowing me to participate in your care!  I recommend that you always bring your medications to each appointment as this makes it easy to ensure we are on the correct medications and helps [place] not miss when refills are needed.  Our plans for today:  -I do not want to make any changes on your blood pressure medicines today.  I would like for you to make a follow-up appointment with Dr. Valentina Lucks for ambulatory blood pressure when you go upfront. -I would like to see you back in about 2 weeks.  I would like for you to get a different blood pressure cuff that is bigger to get more accurate measurements at home.   Take care and seek immediate care sooner if you develop any concerns.   Dr. Lurline Del, [place]

## 2021-02-28 NOTE — Progress Notes (Signed)
    SUBJECTIVE:   CHIEF COMPLAINT / HPI:   Follow-up-hypertension: Patient is a 60 year old female who presents today for follow-up on hypertension.  At her previous appointment we increased her spironolactone from 25 mg a day to 20 mg/day.  She did have a repeat BMP which was completed earlier this week and showed kidney function and potassium levels within normal limits.  Her current medications include lisinopril 40 mg/day, spironolactone 50 mg/day, torsemide as needed for for swelling, she previously used furosemide but because of an AKI we will switch to torsemide., and hydralazine 75 mg 3 times per day.Today she states she continues to measure her blood pressure at home but is getting elevated readings in the 140s over 100 range.  PERTINENT  PMH / PSH: Hypertension  OBJECTIVE:   BP (!) 130/100   Pulse 97   Ht 5' 2"  (1.575 m)   Wt 264 lb 2 oz (119.8 kg)   LMP 04/06/2017   SpO2 96%   BMI 48.31 kg/m    Blood pressure recheck 134/96  General: NAD, pleasant, able to participate in exam Respiratory: No respiratory distress Psych: Normal affect and mood  ASSESSMENT/PLAN:   Hypertension Assessment: 60 year old female with history of hypertension blood pressures remaining elevated with pressure of 130/100, 134/96 on recheck.  She is currently maintained on lisinopril 40 mg/day, spironolactone 50 mg/day, hydralazine 75 mg 3 times per day.  She does have a blood pressure cuff but it seems to be inappropriately sized and seems small. Plan: -Will not make any blood pressure medications changes today -Patient plans to get a bigger blood pressure cuff for more accurate measurements at home -I will have patient see me back in 2 to 3 weeks with these outpatient blood pressure medicines -We will refer for Dr. Valentina Lucks for ambulatory blood pressure to get a better picture of what is going on with the patient's blood pressures, particularly given her narrow pulse pressure.   Lurline Del,  [place]    This note was prepared using Dragon voice recognition software and may include unintentional dictation errors due to the inherent limitations of voice recognition software.

## 2021-03-01 ENCOUNTER — Encounter: Payer: Self-pay | Admitting: Family Medicine

## 2021-03-01 ENCOUNTER — Other Ambulatory Visit: Payer: Self-pay

## 2021-03-01 ENCOUNTER — Ambulatory Visit (INDEPENDENT_AMBULATORY_CARE_PROVIDER_SITE_OTHER): Payer: Medicare Other | Admitting: Family Medicine

## 2021-03-01 DIAGNOSIS — I10 Essential (primary) hypertension: Secondary | ICD-10-CM | POA: Diagnosis not present

## 2021-03-01 MED ORDER — ALBUTEROL SULFATE HFA 108 (90 BASE) MCG/ACT IN AERS: 1.0000 | INHALATION_SPRAY | Freq: Four times a day (QID) | RESPIRATORY_TRACT | 0 refills | Status: DC | PRN

## 2021-03-01 MED ORDER — SPIRONOLACTONE 25 MG PO TABS: 50.0000 mg | ORAL_TABLET | Freq: Every day | ORAL | 3 refills | Status: DC

## 2021-03-01 NOTE — Addendum Note (Signed)
Addended by: Lurline Del on: 03/01/2021 11:14 AM   Modules accepted: Orders

## 2021-03-01 NOTE — Assessment & Plan Note (Signed)
Assessment: 60 year old female with history of hypertension blood pressures remaining elevated with pressure of 130/100, 134/96 on recheck.  She is currently maintained on lisinopril 40 mg/day, spironolactone 50 mg/day, hydralazine 75 mg 3 times per day.  She does have a blood pressure cuff but it seems to be inappropriately sized and seems small. Plan: -Will not make any blood pressure medications changes today -Patient plans to get a bigger blood pressure cuff for more accurate measurements at home -I will have patient see me back in 2 to 3 weeks with these outpatient blood pressure medicines -We will refer for Dr. Valentina Lucks for ambulatory blood pressure to get a better picture of what is going on with the patient's blood pressures, particularly given her narrow pulse pressure.

## 2021-03-04 ENCOUNTER — Ambulatory Visit: Payer: Medicare Other

## 2021-03-04 ENCOUNTER — Telehealth: Payer: Self-pay

## 2021-03-04 NOTE — Telephone Encounter (Signed)
Spoke with patient regarding missed PT appointment. She forgot about scheduled appointment as [birth year] her birthday. She requested to cancel her remaining visits at this time due to financial reasons and will call to reschedule within the next month if she wants to continue with PT; otherwise will plan to discharge in 30 days.

## 2021-03-06 ENCOUNTER — Ambulatory Visit: Payer: Medicare Other

## 2021-03-07 DIAGNOSIS — R928 Other abnormal and inconclusive findings on diagnostic imaging of breast: Secondary | ICD-10-CM | POA: Diagnosis not present

## 2021-03-07 DIAGNOSIS — R922 Inconclusive mammogram: Secondary | ICD-10-CM | POA: Diagnosis not present

## 2021-03-14 ENCOUNTER — Other Ambulatory Visit: Payer: Self-pay

## 2021-03-14 ENCOUNTER — Encounter: Payer: Self-pay | Admitting: Pharmacist

## 2021-03-14 ENCOUNTER — Ambulatory Visit (INDEPENDENT_AMBULATORY_CARE_PROVIDER_SITE_OTHER): Payer: Medicare Other | Admitting: Pharmacist

## 2021-03-14 DIAGNOSIS — E119 Type 2 diabetes mellitus without complications: Secondary | ICD-10-CM | POA: Diagnosis not present

## 2021-03-14 DIAGNOSIS — H52223 Regular astigmatism, bilateral: Secondary | ICD-10-CM | POA: Diagnosis not present

## 2021-03-14 DIAGNOSIS — F17200 Nicotine dependence, unspecified, uncomplicated: Secondary | ICD-10-CM | POA: Diagnosis not present

## 2021-03-14 DIAGNOSIS — H524 Presbyopia: Secondary | ICD-10-CM | POA: Diagnosis not present

## 2021-03-14 DIAGNOSIS — H5213 Myopia, bilateral: Secondary | ICD-10-CM | POA: Diagnosis not present

## 2021-03-14 DIAGNOSIS — I10 Essential (primary) hypertension: Secondary | ICD-10-CM

## 2021-03-14 DIAGNOSIS — H2513 Age-related nuclear cataract, bilateral: Secondary | ICD-10-CM | POA: Diagnosis not present

## 2021-03-14 NOTE — Patient Instructions (Addendum)
Hi, it was good to see you today.   Please do the following:  -STOP taking lisinopril 40 mg.  -START taking losartan 100 mg once daily. We have sent this prescription to your  Pharmacy.  -Try to cut down to 5 cigarettes/day   Please come to your appointment with [place] on June 9, 9:00am.

## 2021-03-14 NOTE — Progress Notes (Signed)
S:    Patient arrives pleasant and in good spirits.    Presents to the clinic for ambulatory blood pressure evaluation.   Patient was referred and last seen by Primary Care Provider Dr. Vanessa Aledo on 03/01/21.     Medication compliance is reported to be good, sometimes struggles with midday doses of TID, self-discontinued tiotropium (spiriva) due to medication burden. Takes all other meds >80% of the time.  Discussed procedure for wearing the monitor and gave patient written instructions. Monitor was placed on non-dominant arm with instructions to return in the morning.   Current BP Medications include:  Hydralazine 75 TID, lisinopril 40 daily, pindolol 10 BID, spironolactone 50 daily,   Antihypertensives tried in the past include: amlodipine (swelling), hydrochlorothiazide, carvedilol, metoprolol,   Dietary habits include:  O:   Last 3 Office BP readings: BP Readings from Last 3 Encounters:  03/01/21 (!) 130/100  02/22/21 (!) 168/99  02/11/21 (!) 142/92     Clinical Atherosclerotic Cardiovascular Disease (ASCVD): No  The 10-year ASCVD risk score Mikey Bussing DC Jr., et al., 2013) is: 23.4%   Values used to calculate the score:     Age: 60 years     Sex: Female     Is Non-Hispanic African American: Yes     Diabetic: Yes     Tobacco smoker: Yes     Systolic Blood Pressure: 837 mmHg     Is BP treated: Yes     HDL Cholesterol: 42 mg/dL     Total Cholesterol: 137 mg/dL  Basic Metabolic Panel    Component Value Date/Time   NA 140 02/27/2021 1000   K 4.3 02/27/2021 1000   CL 105 02/27/2021 1000   CO2 23 02/27/2021 1000   GLUCOSE 90 02/27/2021 1000   GLUCOSE 83 09/24/2020 2047   BUN 12 02/27/2021 1000   CREATININE 0.87 02/27/2021 1000   CREATININE 1.21 (H) 12/08/2016 1538   CALCIUM 9.2 02/27/2021 1000   GFRNONAA 53 (L) 09/24/2020 2047   GFRNONAA 51 (L) 12/08/2016 1538   GFRAA 58 (L) 07/31/2020 1457   GFRAA 59 (L) 12/08/2016 1538    Renal function: Estimated Creatinine  Clearance: 86.8 mL/min (by C-G formula based on SCr of 0.87 mg/dL).   Today's Office Blood Pressure (BP) reading: 186/116 mmHg  Patient sent home with 24 hour blood pressure monitor, and instructions for use. Will return tomorrow at 8:30 am for evaluation.  24 Hour Blood Pressure Monitoring Results Evaluation Patient returns today with blood pressure monitor.   ABPM Study Data: Arm Placement left arm   Overall Mean 24hr BP:   164/103 mmHg HR: 80   Daytime Mean BP:  168/106 mmHg HR: 81   Nighttime Mean BP:  151/93 mmHg  HR: 75   Dipping Pattern: Yes.    Sys:   10.0%   Dia: 12.3%   [normal dipping ~10-20%]  Non-hypertensive ABPM thresholds: daytime BP <125/75 mmHg, sleeptime BP <120/70 mmHg   Discussed results of blood pressure with patient. Blood pressure is uncontrolled, no episodes of tachycardia or bradycardia noted. Pt has normal dipping patterns during sleeptime. Plan to stop lisinopril and initiate losartan for additional uric acid lowering, due to pt's history of gout preventing use of hydrochlorothiazide. Would like to check uric acid levels after initiating losartan. If uric acid <6 at that time, consider initiating a low-dose thiazide for additional blood pressure lowering.  Future trial of low dose alternate DHP-CCB, perhaps nifedipine 51m - Stopped lisinopril 40 mg. - Started losartan 100 mg  daily. Rx sent to pharmacy. Pt instructed to take at bedtime. Patient agreeable to plan.   Tobacco Use disorder - Moderate - long-term interested and willing to attempt tobacco intake reduction and cessation.  Discussed lowering intake as first step.  Patient was asked to keep intake to 8 or less and never more than 15 (even stressful days) until her next follow-up with Rx Clinic.   COPD - Consider use of Trelegy at next visit (patient non-adherent with tiotropium).    Patient will follow up with Dr. Valentina Lucks on 03/28/2021 to check uric acid levels.   Patient was seen with Marlowe Alt  PharmD Candidate, Norina Buzzard, PharmD - PGY-1 Resident. and Lorel Monaco, PharmD, BCPS - PGY2 Pharmacy Resident.

## 2021-03-14 NOTE — Progress Notes (Signed)
Reviewed: I agree with Dr. Koval's documentation and management. 

## 2021-03-15 ENCOUNTER — Other Ambulatory Visit: Payer: Self-pay | Admitting: Family Medicine

## 2021-03-15 MED ORDER — LOSARTAN POTASSIUM 100 MG PO TABS: 100.0000 mg | ORAL_TABLET | Freq: Every day | ORAL | 3 refills | Status: DC

## 2021-03-15 MED ORDER — ALBUTEROL SULFATE HFA 108 (90 BASE) MCG/ACT IN AERS: 2.0000 | INHALATION_SPRAY | Freq: Four times a day (QID) | RESPIRATORY_TRACT | 2 refills | Status: DC | PRN

## 2021-03-15 NOTE — Assessment & Plan Note (Signed)
Tobacco Use disorder - Moderate - long-term interested and willing to attempt tobacco intake reduction and cessation.  Discussed lowering intake as first step.  Patient was asked to keep intake to 8 or less and never more than 15 (even stressful days) until her next follow-up with Rx Clinic.

## 2021-03-15 NOTE — Assessment & Plan Note (Signed)
Discussed results of blood pressure with patient. Blood pressure is uncontrolled, no episodes of tachycardia or bradycardia noted. Pt has normal dipping patterns during sleeptime. Plan to stop lisinopril and initiate losartan for additional uric acid lowering, due to pt's history of gout preventing use of hydrochlorothiazide. Would like to check uric acid levels after initiating losartan. If uric acid <6 at that time, consider initiating a low-dose thiazide for additional blood pressure lowering.  Future trial of low dose alternate DHP-CCB, perhaps nifedipine 44m - Stopped lisinopril 40 mg. - Started losartan 100 mg daily. Rx sent to pharmacy. Pt instructed to take at bedtime. Patient agreeable to plan.

## 2021-03-15 NOTE — Progress Notes (Signed)
I was watching Dr. Luz Lex box while he was out of the office:  Received notification that previous order for Albuterol Aer HFA was not preferred by patient's insurance. Instead it recommended Albuterol HFA (Proventil). I placed an order for Albuterol HFA (Proventil) 2 puffs q6 PRN with 2 refills.   Milus Banister, [place], PGY-3 03/15/2021 12:06 PM

## 2021-03-19 NOTE — Progress Notes (Signed)
Reviewed: I agree with Dr. Koval's documentation and management. 

## 2021-03-21 ENCOUNTER — Ambulatory Visit: Payer: Medicare Other | Admitting: Pharmacist

## 2021-03-24 NOTE — Progress Notes (Deleted)
    SUBJECTIVE:   CHIEF COMPLAINT / HPI:   Hypertension: Patient is a 60 year old female who presents today for follow-up on hypertension. At her previous appointment on 5/13 she was noted to have an elevated blood pressure and was going to keep a record of her blood pressure measurements at home after getting a more appropriate blood pressure cuff.  The patient was also going to see Dr. Everitt Amber for ambulatory blood pressure discussion.  She did have the appointment with Dr. Everitt Amber who exchanged her lisinopril for losartan 100 mg daily with plan for her to follow-up on 03/28/2021 to check uric acid levels to determine if hydrochlorothiazide or similar may be appropriate.  Today she states***.  PERTINENT  PMH / PSH: ***  OBJECTIVE:   LMP 04/06/2017    General: NAD, pleasant, able to participate in exam Respiratory: No respiratory distress Skin: warm and dry, no rashes noted Psych: Normal affect and mood  ASSESSMENT/PLAN:   No problem-specific Assessment & Plan notes found for this encounter.   Assessment: 60 year old female with a history of hypertension.  She has been evaluated with our pharmacy team and has recently had her lisinopril stopped and exchanged for losartan.  She is also been seeing them back on 03/28/2021 to check uric acid levels for consideration of whether hydrochlorothiazide or similar would be appropriate.  Blood pressure today of***. Plan:-***  Referral sent for diabetic eye exam***.  Order sent for mammogram***.  Lurline Del, [place]    This note was prepared using Dragon voice recognition software and may include unintentional dictation errors due to the inherent limitations of voice recognition software.  {    This will disappear when note is signed, click to select method of visit    :1}

## 2021-03-25 ENCOUNTER — Other Ambulatory Visit: Payer: Self-pay

## 2021-03-25 ENCOUNTER — Ambulatory Visit: Payer: Medicare Other | Admitting: Family Medicine

## 2021-03-25 DIAGNOSIS — E119 Type 2 diabetes mellitus without complications: Secondary | ICD-10-CM

## 2021-03-25 MED ORDER — VICTOZA 18 MG/3ML ~~LOC~~ SOPN: PEN_INJECTOR | SUBCUTANEOUS | 3 refills | Status: DC

## 2021-03-28 ENCOUNTER — Ambulatory Visit (INDEPENDENT_AMBULATORY_CARE_PROVIDER_SITE_OTHER): Payer: Medicare Other | Admitting: Pharmacist

## 2021-03-28 ENCOUNTER — Other Ambulatory Visit: Payer: Self-pay

## 2021-03-28 ENCOUNTER — Encounter: Payer: Self-pay | Admitting: Pharmacist

## 2021-03-28 DIAGNOSIS — I10 Essential (primary) hypertension: Secondary | ICD-10-CM | POA: Diagnosis not present

## 2021-03-28 DIAGNOSIS — I5032 Chronic diastolic (congestive) heart failure: Secondary | ICD-10-CM

## 2021-03-28 DIAGNOSIS — E119 Type 2 diabetes mellitus without complications: Secondary | ICD-10-CM

## 2021-03-28 DIAGNOSIS — F17200 Nicotine dependence, unspecified, uncomplicated: Secondary | ICD-10-CM

## 2021-03-28 MED ORDER — NICOTINE POLACRILEX 2 MG MT LOZG: 2.0000 mg | LOZENGE | OROMUCOSAL | 1 refills | Status: DC | PRN

## 2021-03-28 MED ORDER — NIFEDIPINE ER OSMOTIC RELEASE 30 MG PO TB24: 30.0000 mg | ORAL_TABLET | Freq: Every day | ORAL | 3 refills | Status: DC

## 2021-03-28 MED ORDER — HYDRALAZINE HCL 25 MG PO TABS: 75.0000 mg | ORAL_TABLET | Freq: Three times a day (TID) | ORAL | 3 refills | Status: DC

## 2021-03-28 MED ORDER — VICTOZA 18 MG/3ML ~~LOC~~ SOPN: 0.6000 mg | PEN_INJECTOR | Freq: Every day | SUBCUTANEOUS | 3 refills | Status: DC

## 2021-03-28 MED ORDER — SPIRONOLACTONE 100 MG PO TABS: 100.0000 mg | ORAL_TABLET | Freq: Every day | ORAL | 3 refills | Status: DC

## 2021-03-28 MED ORDER — NICOTINE 21 MG/24HR TD PT24: 21.0000 mg | MEDICATED_PATCH | Freq: Every day | TRANSDERMAL | 1 refills | Status: DC

## 2021-03-28 NOTE — Assessment & Plan Note (Addendum)
Diabetes is well controlled with most recently A1c in April 2022 of 6.0 on Jardiance 25 mg daily and Victoza 0.6 mg minus 2 clicks once weekly.  - CHANGE Victoza (liraglutide) to 0.6 mg minus 2 clicks injection to once daily - F/U A1c in 3-6 months

## 2021-03-28 NOTE — Assessment & Plan Note (Signed)
Hypertension diagnosed 2011 currently uncontrolled on current medications. BP Goal = < 130/80 mmHg. Medication adherence good however patient reports taking spironolactone 50 mg twice daily instead of once daily.  -Started nifedipine (Procardia XL) 30 mg once daily. Patient educated on purpose, proper use and potential adverse effects of lower extremity edema.  Lowest dose attempted as she has experience lower extremity edema in the past with 60m amlodipine.  -Continued losartan 100 mg daily -Continued hydralazine 75 mg (3x255m three times daily -Continued pindolol 20 mg twice daily -Continued torsemide 20 mg daily + extra as needed  -Continued spironolactone 100 mg daily (new prescription ordered - for simplicity) as patient seems to be tolerating -F/u labs ordered - BMET -Counseled on lifestyle modifications for blood pressure control including reduced dietary sodium, increased exercise, adequate sleep.

## 2021-03-28 NOTE — Assessment & Plan Note (Signed)
Tobacco use disorder with moderate nicotine dependence.  -Initiated nicotine replacement tx with nicotine 21 mg / 24 h patch daily and nicotine 2 mg lozenge as needed for smoking cessation.  Patient counseled on purpose, proper use, and potential adverse effects.

## 2021-03-28 NOTE — Progress Notes (Signed)
S:    Patient arrives ambulating independently. Presents to the clinic for hypertension evaluation, counseling, and management.  Patient was referred and last seen by Primary Care Provider on 03/01/2021. Patient was last seen by pharmacy hypertension clinic on 03/14/2021.  Medication adherence good however patient is taking some medications not as prescribed (see below).  Current BP Medications include:  hydralazine 75 mg three times daily, losartan 100 mg daily, pindolol 20 mg twice daily, spironolactone 50 mg (prescribed daily, pt reports taking twice daily)  Antihypertensives tried in the past include: carvedilol (sinus pauses, dyspnea, SOB), amlodipine 5 mg daily, amlodipine 10 mg daily (lower extremity swelling)  Dietary habits include: bake foods or airfry, uses Mrs. DASH salt substitute, eats pork and sausage a few times a week, avoids canned food, occasional soda (diet)  ASCVD risk factors include: smoking, diabetes, hypertension  Brand smoked [place]. Number of cigarettes/day 10. Estimated nicotine content per cigarette (mg) 1.4.  Estimated nicotine intake per day 14 mg.    Fagerstrom Score <5  Medications used in past cessation efforts include: Chantix & lozenges did not help in past, patches did work in past  O:  Physical Exam Vitals reviewed.  Constitutional:      Appearance: She is obese.  Neurological:     Mental Status: She is alert and oriented to person, place, and time.  Psychiatric:        Mood and Affect: Mood normal.        Behavior: Behavior normal.        Thought Content: Thought content normal.        Judgment: Judgment normal.   Review of Systems  Gastrointestinal:  Positive for heartburn.  All other systems reviewed and are negative.  Home BP readings: elevated diastolic >544  Last 3 Office BP readings: BP Readings from Last 3 Encounters:  03/14/21 (!) 186/116  03/01/21 (!) 130/100  02/22/21 (!) 168/99    BMET    Component Value Date/Time    NA 140 02/27/2021 1000   K 4.3 02/27/2021 1000   CL 105 02/27/2021 1000   CO2 23 02/27/2021 1000   GLUCOSE 90 02/27/2021 1000   GLUCOSE 83 09/24/2020 2047   BUN 12 02/27/2021 1000   CREATININE 0.87 02/27/2021 1000   CREATININE 1.21 (H) 12/08/2016 1538   CALCIUM 9.2 02/27/2021 1000   GFRNONAA 53 (L) 09/24/2020 2047   GFRNONAA 51 (L) 12/08/2016 1538   GFRAA 58 (L) 07/31/2020 1457   GFRAA 59 (L) 12/08/2016 1538    Renal function: CrCl cannot be calculated (Patient's most recent lab result is older than the maximum 21 days allowed.).  Clinical ASCVD: No  The 10-year ASCVD risk score Mikey Bussing DC Jr., et al., 2013) is: 56.5%   Values used to calculate the score:     Age: 49 years     Sex: Female     Is Non-Hispanic African American: Yes     Diabetic: Yes     Tobacco smoker: Yes     Systolic Blood Pressure: 920 mmHg     Is BP treated: Yes     HDL Cholesterol: 42 mg/dL     Total Cholesterol: 137 mg/dL  A/P:  Hypertension diagnosed 2011 currently uncontrolled on current medications. BP Goal = < 130/80 mmHg. Medication adherence good however patient reports taking spironolactone 50 mg twice daily instead of once daily.  -Started nifedipine (Procardia XL) 30 mg once daily. Patient educated on purpose, proper use and potential adverse effects of lower extremity  edema.  Lowest dose attempted as she has experience lower extremity edema in the past with 9m amlodipine.  -Continued losartan 100 mg daily -Continued hydralazine 75 mg (3x227m three times daily -Continued pindolol 20 mg twice daily -Continued torsemide 20 mg daily + extra as needed  -Continued spironolactone 100 mg daily (new prescription ordered - for simplicity) as patient seems to be tolerating -F/u labs ordered - BMET -Counseled on lifestyle modifications for blood pressure control including reduced dietary sodium, increased exercise, adequate sleep.  Tobacco use disorder with moderate nicotine dependence.  -Initiated  nicotine replacement tx with nicotine 21 mg / 24 h patch daily and nicotine 2 mg lozenge as needed for smoking cessation.  Patient counseled on purpose, proper use, and potential adverse effects.  History of Gout-like symptoms asymptomatic since initiation of allopurinol 300 mg daily.  Most recent uric acid level in 2020 was 8.4.  - Check uric acid level today.  Diabetes is well controlled with most recently A1c in April 2022 of 6.0 on Jardiance  (empagliflozin) 25 mg daily and Victoza (liraglutide) 0.6 mg minus 2 clicks once weekly.  - CHANGE Victoza (liraglutide) to 0.6 mg minus 2 clicks injection to once daily - F/U A1c in 3-6 months  Results reviewed and written information provided.   Total time in face-to-face counseling 25 minutes.   F/U Clinic Visit with Dr. WeVanessa Durhamn June 13th.  Patient seen with JoCaren MacadamharmD Candidate, and EmRomilda GarretPharmD - PGY-1 Resident.

## 2021-03-28 NOTE — Patient Instructions (Addendum)
It was great seeing you today!  For your tobacco cessation, continue working hard on cutting back on your cigarette smoking.  - START taking nicotine patches 21 mg / 24 hr patches. Place one patch onto the skin daily. - START taking nicotine lozenges 2 mg as needed for cigarette cravings.   For your Hypertension, your blood pressure was elevated today above your goal of < 130/80. Continue eating a low salt diet and taking your blood pressure regularly at home.  - START taking nifedipine (Procardia XL) 30 mg once daily  - Let us know if you experience any swelling in your legs  For your diabetes, we will check your A1c at another visit.  - CONTINUE taking Victoza 0.6 mg minus 2 clicks injection under the skin but change to taking DAILY  We are checking a uric acid level for your gout today and a BMET to check your kidney function and electrolytes.   We will follow up with you after your visit with Dr. Vanessa Cooleemee next week. Follow up in pharmacy clinic in 3-4 weeks based on follow up with Dr. Vanessa Haines.

## 2021-03-28 NOTE — Progress Notes (Signed)
Reviewed: I agree with Dr. Koval's documentation and management. 

## 2021-03-29 LAB — BASIC METABOLIC PANEL
BUN/Creatinine Ratio: 15 (ref 9–23)
BUN: 16 mg/dL (ref 6–24)
CO2: 23 mmol/L (ref 20–29)
Calcium: 9.5 mg/dL (ref 8.7–10.2)
Chloride: 101 mmol/L (ref 96–106)
Creatinine, Ser: 1.09 mg/dL — ABNORMAL HIGH (ref 0.57–1.00)
Glucose: 127 mg/dL — ABNORMAL HIGH (ref 65–99)
Potassium: 4.5 mmol/L (ref 3.5–5.2)
Sodium: 140 mmol/L (ref 134–144)
eGFR: 59 mL/min/{1.73_m2} — ABNORMAL LOW (ref >59)

## 2021-03-29 LAB — URIC ACID: Uric Acid: 3 mg/dL (ref 3.0–7.2)

## 2021-04-01 ENCOUNTER — Ambulatory Visit: Payer: Medicare Other | Admitting: Family Medicine

## 2021-04-01 NOTE — Progress Notes (Deleted)
    SUBJECTIVE:   CHIEF COMPLAINT / HPI:   Hypertension follow-up: Patient is a 60 year old female who presents today for hypertension follow-up.  She previously saw pharmacist, Dr. Valentina Lucks who started nifedipine. She continues on losartan 100 mg daily, hydralazine 75 mg 3 times daily, pindolol 5 mg twice daily, torsemide 20 mg daily, spironolactone 100 mg daily.  Obesity: Last BMI 47.9.  Patient has tried***.  PERTINENT  PMH / PSH:   OBJECTIVE:   LMP 04/06/2017    General: NAD, pleasant, able to participate in exam Respiratory: No respiratory distress Skin: warm and dry, no rashes noted Psych: Normal affect and mood   ASSESSMENT/PLAN:   No problem-specific Assessment & Plan notes found for this encounter.   Is due for mammogram***  Lurline Del, DO [place]    {    This will disappear when note is signed, click to select method of visit    :1}

## 2021-04-09 ENCOUNTER — Telehealth: Payer: Self-pay | Admitting: Pharmacist

## 2021-04-09 NOTE — Telephone Encounter (Signed)
Contacted patient for lab and blood pressure follow-up.   Shared lab results - unchanged since last - with patient.   Patient reports lightheadedness, and nausea with recent medication change.  She denies any lower extremity swelling.  She noted that she stopped her torsemide for 3 days due to cramps and feeling like she was dehydrated.    She has not been checking blood pressures at home.    We discussed continuing with medications as currently prescribed until she can be seen in the office.  Asked patient to schedule appointment with me at some point in the near future (later this week).  I shared that I am willing to have her "double booked" in order to be seen this week.   She will call and schedule when she can determine her ride schedule.  Consider D/C hydralazine if BP is at/below goal.

## 2021-04-09 NOTE — Telephone Encounter (Signed)
-----   Message from Zenia Resides, MD sent at 03/29/2021  9:56 AM EDT -----  ----- Message ----- From: Lavone Neri Lab Results In Sent: 03/29/2021   8:14 AM EDT To: Zenia Resides, MD

## 2021-04-09 NOTE — Telephone Encounter (Signed)
Noted and agree. 

## 2021-04-10 ENCOUNTER — Other Ambulatory Visit: Payer: Self-pay | Admitting: Sports Medicine

## 2021-04-10 ENCOUNTER — Other Ambulatory Visit: Payer: Self-pay | Admitting: Podiatry

## 2021-04-10 DIAGNOSIS — E790 Hyperuricemia without signs of inflammatory arthritis and tophaceous disease: Secondary | ICD-10-CM

## 2021-04-11 ENCOUNTER — Ambulatory Visit: Payer: Medicare Other | Admitting: Pharmacist

## 2021-04-12 DIAGNOSIS — G4733 Obstructive sleep apnea (adult) (pediatric): Secondary | ICD-10-CM | POA: Diagnosis not present

## 2021-04-18 ENCOUNTER — Other Ambulatory Visit: Payer: Self-pay

## 2021-04-18 ENCOUNTER — Ambulatory Visit (INDEPENDENT_AMBULATORY_CARE_PROVIDER_SITE_OTHER): Payer: Medicare Other | Admitting: Pharmacist

## 2021-04-18 ENCOUNTER — Encounter: Payer: Self-pay | Admitting: Pharmacist

## 2021-04-18 DIAGNOSIS — J449 Chronic obstructive pulmonary disease, unspecified: Secondary | ICD-10-CM | POA: Diagnosis not present

## 2021-04-18 DIAGNOSIS — I5032 Chronic diastolic (congestive) heart failure: Secondary | ICD-10-CM | POA: Diagnosis not present

## 2021-04-18 DIAGNOSIS — I10 Essential (primary) hypertension: Secondary | ICD-10-CM

## 2021-04-18 MED ORDER — HYDRALAZINE HCL 25 MG PO TABS: 50.0000 mg | ORAL_TABLET | Freq: Three times a day (TID) | ORAL | 3 refills | Status: DC

## 2021-04-18 MED ORDER — TRELEGY ELLIPTA 200-62.5-25 MCG/INH IN AEPB: 1.0000 | INHALATION_SPRAY | Freq: Every day | RESPIRATORY_TRACT | 3 refills | Status: DC

## 2021-04-18 MED ORDER — AMLODIPINE BESYLATE 2.5 MG PO TABS: 2.5000 mg | ORAL_TABLET | Freq: Every day | ORAL | 3 refills | Status: DC

## 2021-04-18 NOTE — Assessment & Plan Note (Signed)
Continues to have dyspnea due to long-term smoking and COPD.  No longer taking tiotropium.  - Discontinued Breo Ellipta, Initiate Trelegy Ellipta 200-62.5-25 1 puff once a day.  -Referred patient to [facility] for smoking cessation counseling and FREE products (patch plus lozenge/gum).

## 2021-04-18 NOTE — Progress Notes (Signed)
S:    Patient arrives in good spirits and ambulating on her own.    Presents to the clinic for hypertension evaluation, counseling, and management.  Patient was referred and last seen by Primary Care Provider on 02/28/21.   Medication adherence is good except for NRT (reports stress (recent deaths in social circle/family)). Patient reports smoking 1 PPD of [employer]. Nicotine Exposure: 20 mg/day(?) Motivation to quit: Change in taste, lack of menthol.  Reports she plans to quit in the next two weeks.   Reports not taking her BG frequently, but when she does take it tends to be in the 100's. Denies need for more lancets or BG strips.   Patient endorses allergies, not currently taking an allergy medications. Reports having taken Spiriva (tiotropium) in the past.   Patient reported dizziness on phone call on 04/09/21. Reports that it has since resolved. Pt. Reports lower extremity edema since starting Nifedipine (also states she has been on her feet more often). BP today is 126/82.   Current BP Medications include:  Spironolactone 100 mg daily at bedtime, Nifedipine 30 mg daily at bedtime, Losartan 100 mg daily at bedtime, Pindolol 10 mg twice daily, Hydralazine 25 mg 3 tabs TID  Antihypertensives tried in the past include: Amlodipine 10 mg (swelling, tolerated 5 mg)  O:  Physical Exam Vitals reviewed.  Constitutional:      Appearance: She is obese.  Pulmonary:     Effort: Pulmonary effort is normal.  Musculoskeletal:     Right lower leg: No edema (trace edema bilaterally).     Left lower leg: No edema.  Neurological:     Mental Status: She is alert.  Psychiatric:        Behavior: Behavior normal.        Thought Content: Thought content normal.    Review of Systems  Eyes:  Positive for redness.  Cardiovascular:  Positive for leg swelling.  Genitourinary:  Positive for frequency.  All other systems reviewed and are negative.  Home BP readings: none  Last 3 Office BP  readings: BP Readings from Last 3 Encounters:  04/18/21 126/82  03/28/21 (!) 163/118  03/14/21 (!) 186/116    BMET    Component Value Date/Time   NA 140 03/28/2021 1135   K 4.5 03/28/2021 1135   CL 101 03/28/2021 1135   CO2 23 03/28/2021 1135   GLUCOSE 127 (H) 03/28/2021 1135   GLUCOSE 83 09/24/2020 2047   BUN 16 03/28/2021 1135   CREATININE 1.09 (H) 03/28/2021 1135   CREATININE 1.21 (H) 12/08/2016 1538   CALCIUM 9.5 03/28/2021 1135   GFRNONAA 53 (L) 09/24/2020 2047   GFRNONAA 51 (L) 12/08/2016 1538   GFRAA 58 (L) 07/31/2020 1457   GFRAA 59 (L) 12/08/2016 1538    Renal function: CrCl cannot be calculated (Patient's most recent lab result is older than the maximum 21 days allowed.).  Clinical ASCVD: No  The 10-year ASCVD risk score Mikey Bussing DC Jr., et al., 2013) is: 21.5%   Values used to calculate the score:     Age: 59 years     Sex: Female     Is Non-Hispanic African American: Yes     Diabetic: Yes     Tobacco smoker: Yes     Systolic Blood Pressure: 846 mmHg     Is BP treated: Yes     HDL Cholesterol: 42 mg/dL     Total Cholesterol: 137 mg/dL  PHQ-9 Score: 7   A/P: Hypertension Hypertension  currently controlled on current medications. BP Goal = < 120/80 mmHg. Medication adherence good.  -Discontinued Nifedipine 30 mg PO daily. Initiate amlodipine 2.5 mg PO QHS.  - Lowered dose of Hydralazine 25 mg tabs to 2 tabs (50 mg) TID.  -Counseled on lifestyle modifications for blood pressure control including reduced dietary sodium, increased exercise, adequate sleep. Consider addition of low dose HCTZ 12.63m at next visit in attempt to reduce / discontinue hydralazine.   COPD/Smoking Cessation Continues to have dyspnea due to long-term smoking and COPD.  No longer taking tiotropium.  - Discontinued Breo Ellipta, Initiate Trelegy Ellipta 200-62.5-25 1 puff once a day.  [facility] (patch plus  lozenge/gum).   Results reviewed and written information provided.   Total time in face-to-face counseling 30 minutes.   F/U Clinic Visit in two weeks (05/02/21).  Patient seen with JCoral Spikes BAnastasia FiedlerPharmD Candidate, ILorel MonacoPharmD, [id number] Ambulatory Care Pharmacy Resident.

## 2021-04-18 NOTE — Assessment & Plan Note (Signed)
Hypertension  currently controlled on current medications. BP Goal = < 120/80 mmHg. Medication adherence good.  -Discontinued Nifedipine 30 mg PO daily. Initiate amlodipine 2.5 mg PO QHS.  - Lowered dose of Hydralazine 25 mg tabs to 2 tabs (50 mg) TID.  -Counseled on lifestyle modifications for blood pressure control including reduced dietary sodium, increased exercise, adequate sleep. Consider addition of low dose HCTZ 12.36m at next visit in attempt to reduce / discontinue hydralazine.

## 2021-04-18 NOTE — Patient Instructions (Addendum)
It was great seeing you today at your visit!  STOP taking Nifedipine 30 mg 1 tablet at night. START taking amlodipine 2.5 mg 1 tablet at night CHANGE how you're taking Hydralazine 25 mg 3 tabs 3 times a day, START taking Hydralazine 25 mg 2 tabs 3 times a day.  STOP taking Breo Ellipta, START taking Trelegy Ellipta 1 inhalation once a day.   Follow-Up with Dr. Valentina Lucks on 05/02/21

## 2021-04-23 NOTE — Progress Notes (Signed)
Reviewed: I agree with Dr. Koval's documentation and management. 

## 2021-04-24 ENCOUNTER — Ambulatory Visit: Payer: Self-pay

## 2021-04-24 ENCOUNTER — Encounter: Payer: Self-pay | Admitting: Neurology

## 2021-04-24 ENCOUNTER — Other Ambulatory Visit: Payer: Self-pay

## 2021-04-24 ENCOUNTER — Ambulatory Visit (INDEPENDENT_AMBULATORY_CARE_PROVIDER_SITE_OTHER): Payer: Medicare Other | Admitting: Family Medicine

## 2021-04-24 ENCOUNTER — Ambulatory Visit (INDEPENDENT_AMBULATORY_CARE_PROVIDER_SITE_OTHER): Payer: Medicare Other

## 2021-04-24 VITALS — BP 136/88 | HR 81 | Ht 62.0 in | Wt 267.6 lb

## 2021-04-24 DIAGNOSIS — G5603 Carpal tunnel syndrome, bilateral upper limbs: Secondary | ICD-10-CM

## 2021-04-24 DIAGNOSIS — M79645 Pain in left finger(s): Secondary | ICD-10-CM

## 2021-04-24 DIAGNOSIS — M5416 Radiculopathy, lumbar region: Secondary | ICD-10-CM

## 2021-04-24 DIAGNOSIS — G8929 Other chronic pain: Secondary | ICD-10-CM

## 2021-04-24 DIAGNOSIS — M1812 Unilateral primary osteoarthritis of first carpometacarpal joint, left hand: Secondary | ICD-10-CM | POA: Diagnosis not present

## 2021-04-24 NOTE — Patient Instructions (Signed)
Thank you for coming in today.   Please get an Xray today before you leave   Please call [facility] at [phone] to schedule your spine injection.    Call or go to the ER if you develop a large red swollen joint with extreme pain or oozing puss.    You should hear from neurology about scheduling the nerve test.   Recheck as needed.

## 2021-04-24 NOTE — Progress Notes (Signed)
I, Peterson Lombard, LAT, ATC acting as a scribe for Lynne Leader, MD.  Hailey Barnes is a 60 y.o. female who presents to [place] [facility] today for bilat hand pain that flared up over the last 2 weeks. Pt was previously seen by Dr. [name] on 11/02/17 for these complaints. Pt locates pain to R wrist w/ radiating pain throughout hand. Pt locates L hand to 1st MCP joint. Pt c/o increased pain w/ gripping.  Dx imaging: 01/14/21 C-spine XR  11/02/17 R & L hand XR   Additionally patient notes returning lumbar radicular pain.  She has a history of back pain and pain rating down the leg.  She has had successful interlaminar injections at L4-5 in the past and would like a repeat injection in the near future if possible.  Last injection was April and December before that.  Pertinent review of systems: No fevers or chills  Relevant historical information: Heart failure, diabetes   Exam:  BP 136/88 (BP Location: Right Arm, Patient Position: Sitting, Cuff Size: Large)   Pulse 81   Ht 5' 2"  (1.575 m)   Wt 267 lb 9.6 oz (121.4 kg)   LMP 04/06/2017   SpO2 98%   BMI 48.94 kg/m  General: Well Developed, well nourished, and in no acute distress.   MSK: Right wrist normal. Positive Tinel's carpal tunnel.  Normal grip strength.  Left thumb swollen at first [facility].  Tender to palpation first [place].  Mild decreased thumb motion.    Lab and Radiology Results  Procedure: Real-time Ultrasound Guided hydrodissection median nerve right carpal tunnel Device: Philips Affiniti 50G Images permanently stored and available for review in PACS Verbal informed consent obtained.  Discussed risks and benefits of procedure. Warned about infection bleeding damage to structures skin hypopigmentation and fat atrophy among others. Patient expresses understanding and agreement Time-out conducted.   Noted no overlying erythema, induration, or other signs of local infection.   Skin prepped in a  sterile fashion.   Local anesthesia: Topical Ethyl chloride.   With sterile technique and under real time ultrasound guidance: 40 mg of Kenalog and 1 mL of lidocaine injected into carpal tunnel around median nerve. Fluid seen entering the carpal tunnel.   Completed without difficulty   Pain immediately resolved suggesting accurate placement of the medication.   Advised to call if fevers/chills, erythema, induration, drainage, or persistent bleeding.   Images permanently stored and available for review in the ultrasound unit.  Impression: Technically successful ultrasound guided injection.    Procedure: Real-time Ultrasound Guided Injection of left first [facility] Device: Philips Affiniti 50G Images permanently stored and available for review in PACS Verbal informed consent obtained.  Discussed risks and benefits of procedure. Warned about infection bleeding damage to structures skin hypopigmentation and fat atrophy among others. Patient expresses understanding and agreement Time-out conducted.   Noted no overlying erythema, induration, or other signs of local infection.   Skin prepped in a sterile fashion.   Local anesthesia: Topical Ethyl chloride.   With sterile technique and under real time ultrasound guidance: 20 mg of Kenalog and 0.5 mL of lidocaine injected into first [place]. Fluid seen entering the joint capsule.   Completed without difficulty   Pain immediately resolved suggesting accurate placement of the medication.   Advised to call if fevers/chills, erythema, induration, drainage, or persistent bleeding.   Images permanently stored and available for review in the ultrasound unit.  Impression: Technically successful ultrasound guided injection.  X-ray images left hand obtained today personally and independently interpreted Significant for [facility] DJD Await formal radiology review     Assessment and Plan: 60 y.o. 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   Additionally lumbar radiculopathy..  Carpal tunnel: Repeat carpal tunnel injection today.  Last injection was about 4-1/2 months ago.  Her symptoms are progressing and I am concerned that repeat injections will not be a good long-term solution.  Plan to proceed injection today but additionally ordered a nerve conduction study that should be done in the next month or 2 to better characterize severity of carpal tunnel syndrome and for potential surgical planning in the future.  Left first [facility] DJD: Repeat injection today.  X-ray obtained today shows more severe DJD per my read however radiology overread is still pending.  Ultimately this may need surgery as well.  Over this is less of an urgent issue.  Lumbar radiculopathy: Repeat epidural steroid injection ordered.  PDMP not reviewed this encounter. Orders Placed This Encounter  Procedures   [place] LIMITED JOINT SPACE STRUCTURES UP RIGHT(NO LINKED CHARGES)    Standing Status:   Future    Number of Occurrences:   1    Standing Expiration Date:   10/25/2021    Order Specific Question:   Reason for Exam (SYMPTOM  OR DIAGNOSIS REQUIRED)    Answer:   right wrist pain    Order Specific Question:   Preferred imaging location?    Answer:   [facility]   DG Hand Complete Left    Standing Status:   Future    Number of Occurrences:   1    Standing Expiration Date:   04/24/2022    Order Specific Question:   Reason for Exam (SYMPTOM  OR DIAGNOSIS REQUIRED)    Answer:   eval 1sr cmc djd    Order Specific Question:   Is patient pregnant?    Answer:   No    Order Specific Question:   Preferred imaging location?    Answer:   [facility] DIAG/THERA/INC NEEDLE/CATH/PLC EPI/LUMB/SAC W/IMG    LUMB EPI #2 [facility] MCR 267 LBS PACS (05/15/19) NO THINS/OTC *SCREENED*     Standing Status:   Future    Standing Expiration Date:   04/24/2022    Order Specific Question:   Reason for Exam (SYMPTOM  OR DIAGNOSIS REQUIRED)    Answer:    back pain and radiculopathy interlaminar approach left at L4-5.    Order Specific Question:   Is the patient pregnant?    Answer:   No    Order Specific Question:   Preferred Imaging Location?    Answer:   [id number]    Order Specific Question:   Radiology Contrast Protocol - do NOT remove file path    Answer:   [email or web address]   Ambulatory referral to Neurology    Referral Priority:   Routine    Referral Type:   Consultation    Referral Reason:   Specialty Services Required    Requested Specialty:   Neurology    Number of Visits Requested:   1   NCV with EMG(electromyography)    Standing Status:   Future    Standing Expiration Date:   04/24/2022    Order Specific Question:   Where should this test be performed?    Answer:   [facility]   No orders of the defined types were placed in this encounter.  Discussed warning signs or symptoms. Please see discharge instructions. Patient expresses understanding.   The above documentation has been reviewed and is accurate and complete Lynne Leader, M.D.

## 2021-04-25 ENCOUNTER — Encounter: Payer: Self-pay | Admitting: Family Medicine

## 2021-04-25 ENCOUNTER — Ambulatory Visit: Payer: Medicare Other | Admitting: Pharmacist

## 2021-04-25 NOTE — Progress Notes (Signed)
X-ray left hand shows arthritis at the base of the thumb possibly due to gout

## 2021-04-29 DIAGNOSIS — Z20822 Contact with and (suspected) exposure to covid-19: Secondary | ICD-10-CM | POA: Diagnosis not present

## 2021-05-02 ENCOUNTER — Encounter: Payer: Self-pay | Admitting: Pharmacist

## 2021-05-02 ENCOUNTER — Other Ambulatory Visit: Payer: Self-pay

## 2021-05-02 ENCOUNTER — Ambulatory Visit (INDEPENDENT_AMBULATORY_CARE_PROVIDER_SITE_OTHER): Payer: Medicare Other | Admitting: Pharmacist

## 2021-05-02 DIAGNOSIS — I10 Essential (primary) hypertension: Secondary | ICD-10-CM | POA: Diagnosis not present

## 2021-05-02 DIAGNOSIS — J449 Chronic obstructive pulmonary disease, unspecified: Secondary | ICD-10-CM | POA: Diagnosis not present

## 2021-05-02 MED ORDER — TRIAMCINOLONE ACETONIDE 0.025 % EX OINT: 1.0000 "application " | TOPICAL_OINTMENT | Freq: Two times a day (BID) | CUTANEOUS | 0 refills | Status: DC | PRN

## 2021-05-02 MED ORDER — FLUCONAZOLE 150 MG PO TABS: 150.0000 mg | ORAL_TABLET | ORAL | 0 refills | Status: AC

## 2021-05-02 NOTE — Assessment & Plan Note (Signed)
Hypertension Hypertension currently controlled on current medications. BP Goal = < 130/80 mmHg. Medication adherence is appropriate.  -Continue current blood pressure medications.  Consider further dose reduction of hydralazine in the future.  -Encouraged her to reduce drinking sodas to improve weight fluctuations.

## 2021-05-02 NOTE — Assessment & Plan Note (Signed)
COPD/Smoking Cessation Breathing has improved since switching to Trelegy Ellipta. Encouraged her to take once daily.  -Referred patient to [facility] for smoking cessation counseling and FREE products (patch plus lozenge/gum).

## 2021-05-02 NOTE — Progress Notes (Signed)
Reviewed.  I agree with the documentation and management of Dr. Valentina Lucks.

## 2021-05-02 NOTE — Patient Instructions (Addendum)
It was nice to see you today!  Your goal blood pressure is less than 130/80 mmHg. In clinic, your blood pressure was 132/78 mmHg!  Medication Changes: Continue current blood pressure medications.   Start checking your blood sugars at home.   Set a quit date! You can call 1-800-QUITNOW. We will call you in a few weeks to see how you're doing.   Keep up the good work with diet and exercise. Aim for a diet full of vegetables, fruit and lean meats (chicken, [place], fish). Try to limit salt intake by eating fresh or frozen vegetables (instead of canned), rinse canned vegetables prior to cooking and do not add any additional salt to meals.

## 2021-05-02 NOTE — Progress Notes (Signed)
S:    Patient arrives in good spirits and ambulating on her own.    Presents to the clinic for hypertension evaluation, counseling, and management.  Patient was referred and last seen by Primary Care Provider, Dr. Vanessa Myrtle, on 03/01/21.   At last pharmacy visit, switched nifedipine to amlodipine 2.5 mg daily and decreased hydralazine from 75 mg TID to 50 mg TID. She reports doing well with this regimen with a home BP of 138/80. Does report slight swelling in feet later in the day but it is tolerable and much improved with switching from nifedipine to amlodipine. Her weight fluctuates, which she attributes to drinking sodas. She is up 6 lbs today from last week. Endorses foot pain a few days ago that caused her to not put weight on her foot but this has resolved and she thinks was related to a gout flare. BP today in clinic was 132/72.   Medication adherence is good except for NRT (reports stress (recent deaths in social circle/family)).  Patient reports smoking 1 PPD of [employer]. Nicotine Exposure: 20 mg/day(?) Motivation to quit: Change in taste, lack of menthol.  Reports she plans to quit in the next two weeks.   Patient reports her breathing is doing much better on Trelegy which she reports taking twice daily rather than once daily as prescribed.   Reports not checking her BG at home. Reports that she feels like her blood sugars are "good" so she has not been checking, however reports vaginal itching and discharge for the last 2 days. Denies need for more lancets or BG strips at this time.   Current BP Medications include:  Spironolactone 100 mg daily at bedtime, amlodipine 2.5 mg daily at bedtime, Losartan 100 mg daily at bedtime, Pindolol 10 mg twice daily, Hydralazine 50 mg TID  Antihypertensives tried in the past include: Amlodipine 10 mg (swelling, tolerated 5 mg)  O:  Physical Exam Vitals reviewed.  Constitutional:      Appearance: She is obese.  Cardiovascular:     Rate  and Rhythm: Normal rate.  Pulmonary:     Effort: Pulmonary effort is normal.  Musculoskeletal:     Right lower leg: No edema (trace edema bilaterally).     Left lower leg: No edema.  Neurological:     Mental Status: She is alert.  Psychiatric:        Mood and Affect: Mood normal.        Behavior: Behavior normal.        Thought Content: Thought content normal.    Review of Systems  Cardiovascular:  Positive for leg swelling (Mild foot swelling at night).  Genitourinary:        Itching, discharge  All other systems reviewed and are negative.  Home BP readings: none  Last 3 Office BP readings: BP Readings from Last 3 Encounters:  04/24/21 136/88  04/18/21 126/82  03/28/21 (!) 163/118    BMET    Component Value Date/Time   NA 140 03/28/2021 1135   K 4.5 03/28/2021 1135   CL 101 03/28/2021 1135   CO2 23 03/28/2021 1135   GLUCOSE 127 (H) 03/28/2021 1135   GLUCOSE 83 09/24/2020 2047   BUN 16 03/28/2021 1135   CREATININE 1.09 (H) 03/28/2021 1135   CREATININE 1.21 (H) 12/08/2016 1538   CALCIUM 9.5 03/28/2021 1135   GFRNONAA 53 (L) 09/24/2020 2047   GFRNONAA 51 (L) 12/08/2016 1538   GFRAA 58 (L) 07/31/2020 1457   GFRAA 59 (L) 12/08/2016  1538    Renal function: CrCl cannot be calculated (Patient's most recent lab result is older than the maximum 21 days allowed.).  Clinical ASCVD: No  The 10-year ASCVD risk score Mikey Bussing DC Jr., et al., 2013) is: 26.5%   Values used to calculate the score:     Age: 60 years     Sex: Female     Is Non-Hispanic African American: Yes     Diabetic: Yes     Tobacco smoker: Yes     Systolic Blood Pressure: 497 mmHg     Is BP treated: Yes     HDL Cholesterol: 42 mg/dL     Total Cholesterol: 137 mg/dL  PHQ-9 Score: 7  A/P: Hypertension Hypertension currently controlled on current medications. BP Goal = < 130/80 mmHg. Medication adherence is appropriate.  -Continue current blood pressure medications.  Consider further dose reduction of  hydralazine in the future.  -Encouraged her to reduce drinking sodas to improve weight fluctuations.   COPD/Smoking Cessation Breathing has improved since switching to Trelegy Ellipta. Encouraged her to take once daily.  -Referred patient to [facility] for smoking cessation counseling and FREE products (patch plus lozenge/gum).   Yeast Infection/Diabetes Suspect patient has a yeast infection, given itching and discharge, due to increased sugar in urine.  -Discussed with Dr. Owens Shark, will initiate fluconazole 150 mg by mouth once, repeat in 3 days. She knows to discontinue Jardiance until infection resolves and then to resume.  -Encouraged her to start checking her blood sugars at home.   Chronic Skin symptoms patient reports as "eczema" treated in the past with Triamcinolone ointment.  Refilled with no refills. Reevaluate efficacy at next PCP visit.   Written information provided.   Total time in face-to-face counseling 24 minutes.    F/U phone call in 2 weeks to check in on smoking cessation and blood glucose.  Patient seen with Rebbeca Paul, PharmD - PGY2 Ambulatory Care Pharmacy Resident.

## 2021-05-07 DIAGNOSIS — Z20822 Contact with and (suspected) exposure to covid-19: Secondary | ICD-10-CM | POA: Diagnosis not present

## 2021-05-13 ENCOUNTER — Ambulatory Visit
Admission: RE | Admit: 2021-05-13 | Discharge: 2021-05-13 | Disposition: A | Discharge status: Home / Self Care | Payer: Medicare Other | Source: Ambulatory Visit | Attending: Family Medicine | Admitting: Family Medicine

## 2021-05-13 DIAGNOSIS — M5416 Radiculopathy, lumbar region: Secondary | ICD-10-CM

## 2021-05-13 MED ORDER — IOPAMIDOL (ISOVUE-M 200) INJECTION 41%
1.0000 mL | Freq: Once | INTRAMUSCULAR | Status: AC
  Administered 2021-05-13: 1 mL via EPIDURAL

## 2021-05-13 MED ORDER — METHYLPREDNISOLONE ACETATE 40 MG/ML INJ SUSP (RADIOLOG
80.0000 mg | Freq: Once | INTRAMUSCULAR | Status: AC
  Administered 2021-05-13: 80 mg via EPIDURAL

## 2021-05-13 NOTE — Discharge Instructions (Signed)

## 2021-05-14 ENCOUNTER — Other Ambulatory Visit: Payer: Self-pay

## 2021-05-14 ENCOUNTER — Telehealth: Payer: Self-pay | Admitting: Cardiology

## 2021-05-14 ENCOUNTER — Emergency Department (HOSPITAL_BASED_OUTPATIENT_CLINIC_OR_DEPARTMENT_OTHER)
Admission: EM | Admit: 2021-05-14 | Discharge: 2021-05-14 | Disposition: A | Discharge status: Home / Self Care | Payer: Medicare Other | Source: Home / Self Care | Attending: Emergency Medicine | Admitting: Emergency Medicine

## 2021-05-14 ENCOUNTER — Emergency Department (HOSPITAL_BASED_OUTPATIENT_CLINIC_OR_DEPARTMENT_OTHER): Payer: Medicare Other | Admitting: Radiology

## 2021-05-14 ENCOUNTER — Emergency Department (HOSPITAL_COMMUNITY)
Admission: EM | Admit: 2021-05-14 | Discharge: 2021-05-14 | Disposition: A | Discharge status: Home / Self Care | Payer: Medicare Other | Attending: Emergency Medicine | Admitting: Emergency Medicine

## 2021-05-14 DIAGNOSIS — I471 Supraventricular tachycardia: Secondary | ICD-10-CM | POA: Insufficient documentation

## 2021-05-14 DIAGNOSIS — I5032 Chronic diastolic (congestive) heart failure: Secondary | ICD-10-CM | POA: Insufficient documentation

## 2021-05-14 DIAGNOSIS — E119 Type 2 diabetes mellitus without complications: Secondary | ICD-10-CM | POA: Insufficient documentation

## 2021-05-14 DIAGNOSIS — R002 Palpitations: Secondary | ICD-10-CM | POA: Insufficient documentation

## 2021-05-14 DIAGNOSIS — Z7982 Long term (current) use of aspirin: Secondary | ICD-10-CM | POA: Insufficient documentation

## 2021-05-14 DIAGNOSIS — I110 Hypertensive heart disease with heart failure: Secondary | ICD-10-CM | POA: Insufficient documentation

## 2021-05-14 DIAGNOSIS — J45909 Unspecified asthma, uncomplicated: Secondary | ICD-10-CM | POA: Insufficient documentation

## 2021-05-14 DIAGNOSIS — R0902 Hypoxemia: Secondary | ICD-10-CM | POA: Diagnosis not present

## 2021-05-14 DIAGNOSIS — Z79899 Other long term (current) drug therapy: Secondary | ICD-10-CM | POA: Insufficient documentation

## 2021-05-14 DIAGNOSIS — F17210 Nicotine dependence, cigarettes, uncomplicated: Secondary | ICD-10-CM | POA: Insufficient documentation

## 2021-05-14 DIAGNOSIS — J449 Chronic obstructive pulmonary disease, unspecified: Secondary | ICD-10-CM | POA: Insufficient documentation

## 2021-05-14 DIAGNOSIS — R000 Tachycardia, unspecified: Secondary | ICD-10-CM | POA: Diagnosis not present

## 2021-05-14 DIAGNOSIS — R0602 Shortness of breath: Secondary | ICD-10-CM | POA: Insufficient documentation

## 2021-05-14 DIAGNOSIS — Z5321 Procedure and treatment not carried out due to patient leaving prior to being seen by health care provider: Secondary | ICD-10-CM | POA: Insufficient documentation

## 2021-05-14 DIAGNOSIS — I10 Essential (primary) hypertension: Secondary | ICD-10-CM | POA: Diagnosis not present

## 2021-05-14 LAB — CBC WITH DIFFERENTIAL/PLATELET
Abs Immature Granulocytes: 0.08 10*3/uL — ABNORMAL HIGH (ref 0.00–0.07)
Basophils Absolute: 0.1 10*3/uL (ref 0.0–0.1)
Basophils Relative: 0 %
Eosinophils Absolute: 0.1 10*3/uL (ref 0.0–0.5)
Eosinophils Relative: 1 %
HCT: 56.1 % — ABNORMAL HIGH (ref 36.0–46.0)
Hemoglobin: 18.4 g/dL — ABNORMAL HIGH (ref 12.0–15.0)
Immature Granulocytes: 1 %
Lymphocytes Relative: 26 %
Lymphs Abs: 4 10*3/uL (ref 0.7–4.0)
MCH: 28.8 pg (ref 26.0–34.0)
MCHC: 32.8 g/dL (ref 30.0–36.0)
MCV: 87.7 fL (ref 80.0–100.0)
Monocytes Absolute: 1 10*3/uL (ref 0.1–1.0)
Monocytes Relative: 6 %
Neutro Abs: 10.4 10*3/uL — ABNORMAL HIGH (ref 1.7–7.7)
Neutrophils Relative %: 66 %
Platelets: 261 10*3/uL (ref 150–400)
RBC: 6.4 MIL/uL — ABNORMAL HIGH (ref 3.87–5.11)
RDW: 15.9 % — ABNORMAL HIGH (ref 11.5–15.5)
WBC: 15.7 10*3/uL — ABNORMAL HIGH (ref 4.0–10.5)
nRBC: 0 % (ref 0.0–0.2)

## 2021-05-14 LAB — BASIC METABOLIC PANEL
Anion gap: 6 (ref 5–15)
BUN: 19 mg/dL (ref 6–20)
CO2: 25 mmol/L (ref 22–32)
Calcium: 9.5 mg/dL (ref 8.9–10.3)
Chloride: 105 mmol/L (ref 98–111)
Creatinine, Ser: 1.26 mg/dL — ABNORMAL HIGH (ref 0.44–1.00)
GFR, Estimated: 49 mL/min — ABNORMAL LOW (ref >60)
Glucose, Bld: 138 mg/dL — ABNORMAL HIGH (ref 70–99)
Potassium: 3.9 mmol/L (ref 3.5–5.1)
Sodium: 136 mmol/L (ref 135–145)

## 2021-05-14 LAB — TROPONIN I (HIGH SENSITIVITY): Troponin I (High Sensitivity): 6 ng/L (ref <18)

## 2021-05-14 NOTE — ED Notes (Signed)
Called patient for vitals patient didn't answer

## 2021-05-14 NOTE — ED Notes (Signed)
Pt requested IV be taken out, as she planned to leave. I removed pt's IV and she walked out of the ED lobby

## 2021-05-14 NOTE — ED Notes (Signed)
Patient is resting comfortably. 

## 2021-05-14 NOTE — Telephone Encounter (Signed)
Patient c/o Palpitations:  High priority if patient c/o lightheadedness, shortness of breath, or chest pain  How long have you had palpitations/irregular HR/ Afib? Are you having the symptoms now? yes  Are you currently experiencing lightheadedness, SOB or CP? Sob and cp  Do you have a history of afib (atrial fibrillation) or irregular heart rhythm? yes  Have you checked your BP or HR? (document readings if available): HR: 140  Are you experiencing any other symptoms? Nausea

## 2021-05-14 NOTE — ED Triage Notes (Signed)
Pt hx of SVT and cardioversion and CHF.  Awoke from sleep this morning with sudden onset of palpitations.  Pt denies palpitations at present but reports tiredness and chest heaviness.

## 2021-05-14 NOTE — Telephone Encounter (Signed)
Pt calling to report her heart is racing. She was in the ED earlier today presenting with SVT. She left AMA. She states her heart is "racing again." I advised her to go back to the hospital for care and evaluation. I offered to call EMS for her. She declined and will have her daughter take her. I strongly recommended she does not leave AMA again and allows treatment.   She verbalized understanding and had no additional questions.

## 2021-05-14 NOTE — ED Provider Notes (Signed)
Emergency Medicine Provider Triage Evaluation Note  Hailey Barnes , a 60 y.o. female  was evaluated in triage.  Pt complains of palpitations. H/o SVT. In SVT here. Started around 8:30am. No CP. She has mild SOB. BP 160's per EMS.   Review of Systems  Positive: palpitations Negative: CP  Physical Exam  LMP 04/06/2017  Gen:   Awake, no distress   Resp:  Normal effort  MSK:   Moves extremities without difficulty  Other:  Tachycardia, 140  Medical Decision Making  Medically screening exam initiated at 9:59 AM.  Appropriate orders placed.  Carlei Huang was informed that the remainder of the evaluation will be completed by another provider, this initial triage assessment does not replace that evaluation, and the importance of remaining in the ED until their evaluation is complete.     Carlisle Cater, PA-C 05/14/21 1000    Lajean Saver, MD 05/16/21 [phone]

## 2021-05-14 NOTE — ED Triage Notes (Signed)
Pt with hx of SVT and cardioversion had sudden onset of palpitations this morning while laying in bed. HR 140 with EMS. Endorses shob.

## 2021-05-14 NOTE — ED Provider Notes (Signed)
[facility] EMERGENCY DEPT Provider Note   CSN: [id number] Arrival date & time: 05/14/21  1740     History Chief Complaint  Patient presents with   Palpitations    Hailey Barnes is a 60 y.o. female.  The history is provided by the patient.  Palpitations Palpitations quality:  Fast Onset quality:  Sudden Progression:  Resolved Chronicity:  New Context: dehydration   Relieved by:  Nothing Worsened by:  Nothing Associated symptoms: chest pressure   Associated symptoms: no back pain, no chest pain, no cough, no lower extremity edema, no shortness of breath and no vomiting       Past Medical History:  Diagnosis Date   Arthritis    "knees" (02/21/2014), back   CHF (congestive heart failure) ([place])    Chronic bronchitis ([place])    "get it q yr" (02/21/2014)   Chronic diastolic CHF (congestive heart failure) ([place])    a. Echo 10/23/2016: EF 75 %   Chronic lower back pain    Complication of anesthesia    "I don't come out well; I chew on my tongue"   COPD (chronic obstructive pulmonary disease) ([place])    Diabetes mellitus without complication ([place])    TYPE 2   Family history of adverse reaction to anesthesia    BROTHER PONV, had a blood clot several days later and heart stopped   Fatty liver    NONALCOLIC   Fibroid    Heart murmur    Hypertension    Infection of right eye    current dx on Saturday 7/15 - using drops   Leg swelling    bilateral   Migraine 1982-2009   MVP (mitral valve prolapse)    Neuromuscular disorder (HCC)    right leg nerve pain   Shortness of breath    WITH EXERTION   Sinus pause    a. noted on telemetry during admission from 10/2016, lasting up to 4.4 seconds. BB discontinued.    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   D/Dx: carpal tunnel, ulnar neuropathy Less likely: cervical radiculopathy, vasculitis     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   Lesion of cervix 07/05/2020   COPD (chronic obstructive pulmonary disease) ([place]) 98/92/1194   Plica of knee, left 17/40/8144   Chondromalacia, left knee 08/16/2018   Primary osteoarthritis of both knees 03/24/2018   Bilateral primary osteoarthritis of first carpometacarpal joints 11/16/2017   OSA (obstructive sleep apnea) 10/30/2016   Left lumbar radiculopathy 06/02/2016   Hyperlipidemia 02/07/2016   T2DM (type 2 diabetes mellitus) ([place]) 12/07/2015   Depression 04/19/2015   Diminished vision 06/23/2014   Chronic diastolic CHF (congestive heart failure) ([place]) 06/13/2014   Restrictive lung disease 05/10/2014   NASH (nonalcoholic steatohepatitis) 01/04/2014   Hypertension 05/26/2010   Morbid obesity ([place]) 12/17/2006   Tobacco use disorder 12/17/2006    Past Surgical History:  Procedure Laterality Date   CESAREAN SECTION  1982   CHOLECYSTECTOMY N/A 06/17/2017   Procedure: LAPAROSCOPIC CHOLECYSTECTOMY WITH INTRAOPERATIVE CHOLANGIOGRAM;  Surgeon: Donnie Mesa, MD;  Location: [facility];  Service: General;  Laterality: N/A;   COLONOSCOPY WITH PROPOFOL N/A 04/28/2017  Procedure: COLONOSCOPY WITH PROPOFOL;  Surgeon: Manus Gunning, MD;  Location: [facility];  Service: Gastroenterology;  Laterality: N/A;   DILATATION & CURETTAGE/HYSTEROSCOPY WITH MYOSURE N/A 05/06/2016   Procedure: DILATATION & CURETTAGE/HYSTEROSCOPY WITH MYOSURE;  Surgeon: Terrance Mass, MD;  Location: [facility];  Service: Gynecology;  Laterality: N/A;  request to follow around 8:45  requests one hour OR time   [place] N/A 04/03/2017   Procedure: [place];  Surgeon: Terrance Mass, MD;  Location: [facility];   Service: Gynecology;  Laterality: N/A;   DILATION AND CURETTAGE OF UTERUS     ESOPHAGOGASTRODUODENOSCOPY (EGD) WITH PROPOFOL N/A 04/28/2017   Procedure: ESOPHAGOGASTRODUODENOSCOPY (EGD) WITH PROPOFOL;  Surgeon: Manus Gunning, MD;  Location: [facility];  Service: Gastroenterology;  Laterality: N/A;   FOOT SURGERY Bilateral    "took bones out; put pins in"   IR ABLATE LIVER CRYOABLATION  02/08/2020   IR RADIOLOGIST EVAL & MGMT  02/03/2020   KNEE ARTHROSCOPY Left 08/16/2018   Procedure: ARTHROSCOPY KNEE PARTIAL MEDIAL MENISECTOMY, CHONDROPLASTY MEDIAL AND LATERAL, PLICA RELEASE MEDIAL;  Surgeon: Dorna Leitz, MD;  Location: [facility];  Service: Orthopedics;  Laterality: Left;   LEFT AND RIGHT HEART CATHETERIZATION WITH CORONARY ANGIOGRAM N/A 03/31/2014   Procedure: LEFT AND RIGHT HEART CATHETERIZATION WITH CORONARY ANGIOGRAM;  Surgeon: Birdie Riddle, MD;  Location: [facility];  Service: Cardiovascular;  Laterality: N/A;   MYOMECTOMY  YRS AGO   sonohystogram  04/14/2016   [facility]: intramural fibroid, premenopausal endometrium, right fluid filled tubular structure     OB History     Gravida  1   Para  1   Term      Preterm      AB      Living  1      SAB      IAB      Ectopic      Multiple      Live Births              Family History  Problem Relation Age of Onset   Hypertension Mother    COPD Mother    Hypertension Father    Cancer Sister        UTERINE???   COPD Sister    Hypertension Sister    Hypertension Sister     Social History   Tobacco Use   Smoking status: Every Day    Packs/day: 0.50    Years: 42.00    Pack years: 21.00    Types: Cigarettes    Start date: 10/20/1976   Smokeless tobacco: Never   Tobacco comments:    Previous 1 ppd smoker x > 40 years.  Now smokes 10 cigarettes per day  Vaping Use   Vaping Use: Never used  Substance Use Topics   Alcohol use: Yes    Comment: very occasional   Drug use: Yes     Types: "Crack" cocaine, Marijuana    Comment: 03/30/2014 "stopped using crack 02/21/2014"    Home Medications Prior to Admission medications   Medication Sig Start Date End Date Taking? Authorizing Provider  albuterol (VENTOLIN HFA) 108 (90 Base) MCG/ACT inhaler Inhale 2 puffs into the lungs every 6 (six) hours as needed for wheezing or shortness of breath. 03/15/21   Daisy Floro, DO  allopurinol (ZYLOPRIM) 300 MG tablet TAKE 1 TABLET BY MOUTH EVERY DAY Patient taking differently: Take 300 mg by mouth daily. 04/10/21  Silverio Decamp, MD  amLODipine (NORVASC) 2.5 MG tablet Take 1 tablet (2.5 mg total) by mouth at bedtime. 04/18/21   Zenia Resides, MD  aspirin EC 81 MG tablet Take 81 mg by mouth daily.    [provider]  atorvastatin (LIPITOR) 40 MG tablet Take 40 mg by mouth daily.    [provider]  Cholecalciferol (VITAMIN D) 125 MCG (5000 UT) CAPS Take 10,000 Units by mouth daily.    [provider]  empagliflozin (JARDIANCE) 25 MG TABS tablet Take 1 tablet (25 mg total) by mouth daily. 04/16/20   Myles Gip, DO  Fluticasone-Umeclidin-Vilant (TRELEGY ELLIPTA) 200-62.5-25 MCG/INH AEPB Inhale 1 puff into the lungs daily. 04/18/21   Zenia Resides, MD  glucose blood (ACCU-CHEK AVIVA) test strip Use to check sugar up to 3 times daily 04/14/19   Zenia Resides, MD  hydrALAZINE (APRESOLINE) 25 MG tablet Take 2 tablets (50 mg total) by mouth 3 (three) times daily. 04/18/21   Zenia Resides, MD  Lancets (ACCU-CHEK SOFT TOUCH) lancets Use as instructed 04/05/19   Myles Gip, DO  liraglutide (VICTOZA) 18 MG/3ML SOPN Inject 0.6 mg into the skin daily. Inject 2.7OZ minus 2 clicks under the skin daily. Patient taking differently: Inject 0.6 mg into the skin daily. 03/28/21   Zenia Resides, MD  losartan (COZAAR) 100 MG tablet Take 1 tablet (100 mg total) by mouth daily. Patient taking differently: Take 100 mg by mouth at bedtime. 03/15/21    Zenia Resides, MD  meloxicam (MOBIC) 15 MG tablet TAKE 1 TABLET (15 MG TOTAL) BY MOUTH DAILY. Patient taking differently: Take 15 mg by mouth daily as needed for pain. 04/10/21   Edrick Kins, DPM  Multiple Vitamin (MULTIVITAMIN) tablet Take 1 tablet by mouth daily.    [provider]  nicotine (NICODERM CQ - DOSED IN MG/24 HOURS) 21 mg/24hr patch Place 1 patch (21 mg total) onto the skin daily. 03/28/21   Zenia Resides, MD  nicotine polacrilex (COMMIT) 2 MG lozenge Take 1 lozenge (2 mg total) by mouth as needed for smoking cessation. 03/28/21   Zenia Resides, MD  NIFEdipine (ADALAT CC) 30 MG 24 hr tablet Take 30 mg by mouth at bedtime.    [provider]  pindolol (VISKEN) 10 MG tablet Take 2 tablets (20 mg total) by mouth 2 (two) times daily. 12/18/20   [name], MD  Polyvinyl Alcohol-Povidone (CLEAR EYES ALL SEASONS OP) Place 1 drop into both eyes daily as needed (allergies/itchy eyes).    [provider]  saccharomyces boulardii (FLORASTOR) 250 MG capsule Take 250 mg by mouth daily.    [provider]  spironolactone (ALDACTONE) 100 MG tablet Take 1 tablet (100 mg total) by mouth at bedtime. 03/28/21   Zenia Resides, MD  torsemide (DEMADEX) 20 MG tablet TAKE 1 TABLET BY MOUTH EVERY DAY MAY TAKE 1 EXTRA TABLET DAILY ONLY AS NEEDED Patient taking differently: Take 20 mg by mouth 2 (two) times daily as needed (excess fluid). 11/14/20   Lurline Del, DO  triamcinolone (KENALOG) 0.025 % ointment Apply 1 application topically 2 (two) times daily as needed. Patient taking differently: Apply 1 application topically 2 (two) times daily as needed (irritation). 05/02/21   Zenia Resides, MD    Allergies    Bupropion and Norvasc [amlodipine]  Review of Systems   Review of Systems  Constitutional:  Negative for chills and fever.  HENT:  Negative for  ear pain and sore throat.   Eyes:  Negative for pain and visual disturbance.  Respiratory:  Negative  for cough and shortness of breath.   Cardiovascular:  Positive for palpitations. Negative for chest pain.  Gastrointestinal:  Negative for abdominal pain and vomiting.  Genitourinary:  Negative for dysuria and hematuria.  Musculoskeletal:  Negative for arthralgias and back pain.  Skin:  Negative for color change and rash.  Neurological:  Negative for seizures and syncope.  All other systems reviewed and are negative.  Physical Exam Updated Vital Signs BP 112/69   Pulse 76   Temp 98.6 F (37 C) (Oral)   Resp 15   Ht 5' 2"  (1.575 m)   Wt 118.4 kg   LMP 04/06/2017   SpO2 90% Comment: pt sleeping  BMI 47.74 kg/m   Physical Exam Vitals and nursing note reviewed.  Constitutional:      General: She is not in acute distress.    Appearance: She is well-developed. She is not ill-appearing.  HENT:     Head: Normocephalic and atraumatic.     Mouth/Throat:     Mouth: Mucous membranes are moist.  Eyes:     Extraocular Movements: Extraocular movements intact.     Conjunctiva/sclera: Conjunctivae normal.     Pupils: Pupils are equal, round, and reactive to light.  Cardiovascular:     Rate and Rhythm: Normal rate and regular rhythm.     Pulses: Normal pulses.     Heart sounds: Normal heart sounds. No murmur heard. Pulmonary:     Effort: Pulmonary effort is normal. No respiratory distress.     Breath sounds: Normal breath sounds.  Abdominal:     Palpations: Abdomen is soft.     Tenderness: There is no abdominal tenderness.  Musculoskeletal:     Cervical back: Neck supple.  Skin:    General: Skin is warm and dry.     Capillary Refill: Capillary refill takes less than 2 seconds.  Neurological:     General: No focal deficit present.     Mental Status: She is alert.    ED Results / Procedures / Treatments   Labs (all labs ordered are listed, but only abnormal results are displayed) Labs Reviewed  TROPONIN I (HIGH SENSITIVITY)    EKG EKG Interpretation  Date/Time:  Tuesday  May 14 2021 17:49:42 EDT Ventricular Rate:  84 PR Interval:  144 QRS Duration: 80 QT Interval:  388 QTC Calculation: 458 R Axis:   0 Text Interpretation: Normal sinus rhythm Right atrial enlargement Low voltage QRS Confirmed by Lennice Sites ([id number]) on 05/14/2021 6:12:14 PM  Radiology DG Chest 2 View  Result Date: 05/14/2021 CLINICAL DATA:  Palpitations. EXAM: CHEST - 2 VIEW COMPARISON:  04/08/2020. FINDINGS: The heart size and mediastinal contours are within normal limits. Both lungs are clear. No visible pleural effusions or pneumothorax. No acute osseous abnormality. IMPRESSION: No evidence of acute cardiopulmonary disease. Electronically Signed   By: Margaretha Sheffield MD   On: 05/14/2021 19:48   DG INJECT DIAG/THERA/INC NEEDLE/CATH/PLC EPI/LUMB/SAC W/IMG  Result Date: 05/13/2021 CLINICAL DATA:  Back pain and radiculopathy. Bilateral lower extremity pain, right greater than left. FLUOROSCOPY TIME:  Radiation Exposure Index (as provided by the fluoroscopic device): 44.36 uGy*m2 PROCEDURE: The procedure, risks, benefits, and alternatives were explained to the patient. Questions regarding the procedure were encouraged and answered. The patient understands and consents to the procedure. LUMBAR EPIDURAL INJECTION: An interlaminar approach was performed on right at L4-5. The overlying skin was cleansed  and anesthetized. A 20 gauge epidural needle was advanced using loss-of-resistance technique. DIAGNOSTIC EPIDURAL INJECTION: Injection of Isovue-M 200 shows a good epidural pattern with spread above and below the level of needle placement, primarily on the right no vascular opacification is seen. THERAPEUTIC EPIDURAL INJECTION: 80 mg of Depo-Medrol mixed with 1 mL 1% lidocaine. Were instilled. The procedure was well-tolerated, and the patient was discharged thirty minutes following the injection in good condition. COMPLICATIONS: None IMPRESSION: Technically successful epidural injection on the right L4-5 #  2 Electronically Signed   By: San Morelle M.D.   On: 05/13/2021 13:52    Procedures Procedures   Medications Ordered in ED Medications - No data to display  ED Course  I have reviewed the triage vital signs and the nursing notes.  Pertinent labs & imaging results that were available during my care of the patient were reviewed by me and considered in my medical decision making (see chart for details).    MDM Rules/Calculators/A&P                           Hailey Barnes is here with palpitations.  Was at previous emergency department and then came here.  Initial EKG does appear to be consistent with SVT but EKG here does not show SVT.  She is in normal sinus rhythm.  Lab work is unremarkable.  Troponin normal.  Overall educated about SVT.  Given reassurance and discharged in ED in good condition.  Recommend follow-up with cardiology.  This chart was dictated using voice recognition software.  Despite best efforts to proofread,  errors can occur which can change the documentation meaning.   Final Clinical Impression(s) / ED Diagnoses Final diagnoses:  SVT (supraventricular tachycardia) [facility])    Rx / DC Orders ED Discharge Orders     None        Lennice Sites, DO 05/14/21 2014

## 2021-05-16 NOTE — Addendum Note (Signed)
Addended by: Dollene Primrose on: 05/16/2021 02:07 PM   Modules accepted: Orders

## 2021-05-16 NOTE — Telephone Encounter (Signed)
Referral for EP placed.

## 2021-05-17 ENCOUNTER — Other Ambulatory Visit: Payer: Self-pay | Admitting: Family Medicine

## 2021-05-20 ENCOUNTER — Other Ambulatory Visit: Payer: Self-pay

## 2021-05-20 ENCOUNTER — Ambulatory Visit (INDEPENDENT_AMBULATORY_CARE_PROVIDER_SITE_OTHER): Payer: Medicare Other | Admitting: Internal Medicine

## 2021-05-20 ENCOUNTER — Encounter: Payer: Self-pay | Admitting: *Deleted

## 2021-05-20 VITALS — BP 134/84 | HR 75 | Ht 62.5 in | Wt 270.6 lb

## 2021-05-20 DIAGNOSIS — I471 Supraventricular tachycardia: Secondary | ICD-10-CM | POA: Diagnosis not present

## 2021-05-20 DIAGNOSIS — G4733 Obstructive sleep apnea (adult) (pediatric): Secondary | ICD-10-CM

## 2021-05-20 DIAGNOSIS — Z720 Tobacco use: Secondary | ICD-10-CM | POA: Diagnosis not present

## 2021-05-20 DIAGNOSIS — Z20822 Contact with and (suspected) exposure to covid-19: Secondary | ICD-10-CM | POA: Diagnosis not present

## 2021-05-20 NOTE — Progress Notes (Signed)
Electrophysiology Office Note   Date:  05/20/2021   ID:  [name], [name] [birth year], MRN [id number]  PCP:  Lurline Del, DO  Cardiologist:  Dr Marlou Porch Primary Electrophysiologist: Thompson Grayer, MD    CC: SVT   History of Present Illness: Hailey Barnes is a 60 y.o. female who presents today for electrophysiology evaluation.   The patient is referred by Dr Marlou Porch for EP consultation regarding SVT. The patient reports initially having SVT 09/24/20 upon waking from sleep.  She had termination of her arrhythmia with adensine 70m IV by EMS.  Strips are reviewed in media from 09/24/20 EMS run sheet. She has been treated with pindolol. She has recurrence of SVT again waking her from sleep 05/14/21.  She did not receive adenosine.  Her tachycardia gradually terminated. She is unaware of triggers, precipitants, or modifying factors.   Today, she denies symptoms of palpitations, exertional chest pain, shortness of breath, orthopnea, PND, lower extremity edema, claudication, dizziness, presyncope, syncope, bleeding, or neurologic sequela. The patient is tolerating medications without difficulties and is otherwise without complaint today.    Past Medical History:  Diagnosis Date   Arthritis    "knees" (02/21/2014), back   CHF (congestive heart failure) ([place]    Chronic bronchitis ([place]    "get it q yr" (02/21/2014)   Chronic diastolic CHF (congestive heart failure) ([facility]    a. 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 noted on telemetry during admission from 10/2016, lasting up to 4.4 seconds. BB discontinued.    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   D/Dx: carpal tunnel, ulnar neuropathy Less likely: cervical radiculopathy, vasculitis    Past Surgical History:  Procedure Laterality Date   CESAREAN SECTION  1982   CHOLECYSTECTOMY N/A 06/17/2017   Procedure: LAPAROSCOPIC CHOLECYSTECTOMY WITH INTRAOPERATIVE CHOLANGIOGRAM;  Surgeon: TDonnie [place] MD;  [place]  Service: General;  Laterality: N/A;   COLONOSCOPY WITH PROPOFOL N/A 04/28/2017   Procedure: COLONOSCOPY WITH PROPOFOL;  Surgeon: AManus Gunning MD;  [facility];  Service: Gastroenterology;  Laterality: N/A;   DILATATION & CURETTAGE/HYSTEROSCOPY WITH MYOSURE N/A 05/06/2016   Procedure: DILATATION & CURETTAGE/HYSTEROSCOPY WITH MYOSURE;  Surgeon: JTerrance Mass MD;  [facility];  Service: Gynecology;  Laterality: N/A;  request  to follow around 8:45  requests one hour OR time   [place] N/A 04/03/2017   Procedure: DILATATION & CURETTAGE/HYSTEROSCOPY WITH MYOSURE;  Surgeon: Terrance Mass, MD;  Location: [facility];  Service: Gynecology;  Laterality: N/A;   DILATION AND CURETTAGE OF UTERUS     ESOPHAGOGASTRODUODENOSCOPY (EGD) WITH PROPOFOL N/A 04/28/2017   Procedure: ESOPHAGOGASTRODUODENOSCOPY (EGD) WITH PROPOFOL;  Surgeon: Manus Gunning, MD;  [facility];  Service: Gastroenterology;  Laterality:  N/A;   FOOT SURGERY Bilateral    "took bones out; put pins in"   IR ABLATE LIVER CRYOABLATION  02/08/2020   IR RADIOLOGIST EVAL & MGMT  02/03/2020   KNEE ARTHROSCOPY Left 08/16/2018   Procedure: ARTHROSCOPY KNEE PARTIAL MEDIAL MENISECTOMY, CHONDROPLASTY MEDIAL AND LATERAL, PLICA RELEASE MEDIAL;  Surgeon: Dorna Leitz, MD;  Location: [place];  Service: Orthopedics;  Laterality: Left;   LEFT AND RIGHT HEART CATHETERIZATION WITH CORONARY ANGIOGRAM N/A 03/31/2014   Procedure: LEFT AND RIGHT HEART CATHETERIZATION WITH CORONARY ANGIOGRAM;  Surgeon: Birdie Riddle, MD;  Location: [facility];  Service: Cardiovascular;  Laterality: N/A;   MYOMECTOMY  YRS AGO   sonohystogram  04/14/2016   [facility]: intramural fibroid, premenopausal endometrium, right fluid filled tubular structure     Current Outpatient Medications  Medication Sig Dispense Refill   albuterol (VENTOLIN HFA) 108 (90 Base) MCG/ACT inhaler Inhale 2 puffs into the lungs every 6 (six) hours as needed for wheezing or shortness of breath. 1 each 2   allopurinol (ZYLOPRIM) 300 MG tablet TAKE 1 TABLET BY MOUTH EVERY DAY (Patient taking differently: Take 300 mg by mouth daily.) 90 tablet 1   amLODipine (NORVASC) 2.5 MG tablet Take 1 tablet (2.5 mg total) by mouth at bedtime. 90 tablet 3   aspirin EC 81 MG tablet Take 81 mg by mouth daily.     atorvastatin (LIPITOR) 40 MG tablet Take 40 mg by mouth daily.     Cholecalciferol (VITAMIN D) 125 MCG (5000 UT) CAPS Take 10,000 Units by mouth daily.     empagliflozin (JARDIANCE) 25 MG TABS tablet Take 1 tablet (25 mg total) by mouth daily. 90 tablet 3   Fluticasone-Umeclidin-Vilant (TRELEGY ELLIPTA) 200-62.5-25 MCG/INH AEPB Inhale 1 puff into the lungs daily. 1 each 3   glucose blood (ACCU-CHEK AVIVA) test strip Use to check sugar up to 3 times daily 100 each 12   hydrALAZINE (APRESOLINE) 25 MG tablet Take 2 tablets (50 mg total) by mouth 3 (three) times daily. 270 tablet 3    Lancets (ACCU-CHEK SOFT TOUCH) lancets Use as instructed 100 each 12   liraglutide (VICTOZA) 18 MG/3ML SOPN Inject 0.6 mg into the skin daily. Inject 0.3JK minus 2 clicks under the skin daily. (Patient taking differently: Inject 0.6 mg into the skin daily.) 3 mL 3   losartan (COZAAR) 100 MG tablet Take 1 tablet (100 mg total) by mouth daily. (Patient taking differently: Take 100 mg by mouth at bedtime.) 90 tablet 3   meloxicam (MOBIC) 15 MG tablet TAKE 1 TABLET (15 MG TOTAL) BY MOUTH DAILY. (Patient taking differently: Take 15 mg by mouth daily as needed for pain.) 30 tablet 1   Multiple Vitamin (MULTIVITAMIN) tablet Take 1 tablet by mouth daily.     nicotine (NICODERM CQ - DOSED IN MG/24 HOURS) 21 mg/24hr patch Place 1 patch (21 mg total) onto the skin daily. 28 patch 1   nicotine polacrilex (COMMIT) 2 MG lozenge Take 1 lozenge (2 mg total) by mouth  as needed for smoking cessation. 100 tablet 1   NIFEdipine (ADALAT CC) 30 MG 24 hr tablet Take 30 mg by mouth at bedtime.     pindolol (VISKEN) 10 MG tablet Take 2 tablets (20 mg total) by mouth 2 (two) times daily. 120 tablet 11   Polyvinyl Alcohol-Povidone (CLEAR EYES ALL SEASONS OP) Place 1 drop into both eyes daily as needed (allergies/itchy eyes).     saccharomyces boulardii (FLORASTOR) 250 MG capsule Take 250 mg by mouth daily.     spironolactone (ALDACTONE) 100 MG tablet Take 1 tablet (100 mg total) by mouth at bedtime. 90 tablet 3   torsemide (DEMADEX) 20 MG tablet TAKE 1 TABLET BY MOUTH EVERY DAY MAY TAKE 1 EXTRA TABLET DAILY ONLY AS NEEDED (Patient taking differently: Take 20 mg by mouth 2 (two) times daily as needed (excess fluid).) 180 tablet 1   triamcinolone (KENALOG) 0.025 % ointment Apply 1 application topically 2 (two) times daily as needed. (Patient taking differently: Apply 1 application topically 2 (two) times daily as needed (irritation).) 80 g 0   No current facility-administered medications for this visit.    Allergies:    Bupropion and Norvasc [amlodipine]   Social History:  The patient  reports that she has been smoking cigarettes. She started smoking about 44 years ago. She has a 21.00 pack-year smoking history. She has never used smokeless tobacco. She reports current alcohol use. She reports current drug use. Drugs: "Crack" cocaine and Marijuana.   Family History:  The patient's family history includes COPD in her mother and sister; Cancer in her sister; Hypertension in her father, mother, sister, and sister.    ROS:  Please see the history of present illness.   All other systems are personally reviewed and negative.    PHYSICAL EXAM: VS:  BP 134/84   Pulse 75   Ht 5' 2.5" (1.588 m)   Wt 270 lb 9.6 oz (122.7 kg)   LMP 04/06/2017   SpO2 97%   BMI 48.70 kg/m  , BMI Body mass index is 48.7 kg/m. GEN: overweight, in no acute distress HEENT: normal Neck: no JVD, carotid bruits, or masses Cardiac: RRR; no murmurs, rubs, or gallops,no edema  Respiratory:  normal work of breathing GI: soft  MS: no deformity or atrophy Skin: warm and dry  Neuro:  Strength and sensation are intact Psych: euthymic mood, full affect  EKG:  EKG is ordered today. The ekg ordered today is personally reviewed and shows sinus rhythm 75 bpm, PR 164 msec, QRS 82 msec   Recent Labs: 05/14/2021: BUN 19; Creatinine, Ser 1.26; Hemoglobin 18.4; Platelets 261; Potassium 3.9; Sodium 136  personally reviewed   Lipid Panel     Component Value Date/Time   CHOL 137 04/09/2020 1119   TRIG 155 (H) 04/09/2020 1119   HDL 42 04/09/2020 1119   CHOLHDL 3.3 04/09/2020 1119   CHOLHDL 4.1 07/02/2017 0652   VLDL 23 07/02/2017 0652   LDLCALC 68 04/09/2020 1119   personally reviewed   Wt Readings from Last 3 Encounters:  05/20/21 270 lb 9.6 oz (122.7 kg)  05/14/21 261 lb (118.4 kg)  05/02/21 267 lb 3.2 oz (121.2 kg)      Other studies personally reviewed: Additional studies/ records that were reviewed today include: Dr Marlou Porch  notes, prior echo, ems run sheet and ED notes, prior ecgs  Review of the above records today demonstrates: as above   ASSESSMENT AND PLAN:  1.  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  She has failed medical therapy with pindolol. Therapeutic strategies for supraventricular tachycardia including medicine and ablation were discussed in detail with the patient today. Risk, benefits, and alternatives to EP study and radiofrequency ablation were also discussed in detail today. These risks include but are not limited to stroke, bleeding, vascular damage, tamponade, perforation, damage to the heart and other structures, AV block requiring pacemaker, worsening renal function, and death. The patient understands these risk and wishes to proceed.  We will therefore proceed with catheter ablation at the next available time.   Risks, benefits and potential toxicities for medications prescribed and/or refilled reviewed with patient today.   2. OSA Uses CPAP  3. Morbid obesity Body mass index is 48.7 kg/m. Lifestyle modification advised  4. Tobacco Cessation advised      Signed, Thompson Grayer, MD  05/20/2021 10:36 AM     [facility] [address] [place] [address] [phone] (office) [phone] (fax)

## 2021-05-20 NOTE — Patient Instructions (Addendum)
Medication Instructions:  Your physician recommends that you continue on your current medications as directed. Please refer to the Current Medication list given to you today.  Labwork: None ordered.  Testing/Procedures: Your physician has recommended that you have an ablation. Catheter ablation is a medical procedure used to treat some cardiac arrhythmias (irregular heartbeats). During catheter ablation, a long, thin, flexible tube is put into a blood vessel in your groin (upper thigh), or neck. This tube is called an ablation catheter. It is then guided to your heart through the blood vessel. Radio frequency waves destroy small areas of heart tissue where abnormal heartbeats may cause an arrhythmia to start. Please see the instruction sheet given to you today.     Any Other Special Instructions Will Be Listed Below (If Applicable).  If you need a refill on your cardiac medications before your next appointment, please call your pharmacy.   Cardiac Ablation Cardiac ablation is a procedure to destroy (ablate) some heart tissue that is sending bad signals. These bad signals causeproblems in heart rhythm. The heart has many areas that make these signals. If there are problems in these areas, they can make the heart beat in a way that is not normal.Destroying some tissues can help make the heart rhythm normal. Tell your doctor about: Any allergies you have. All medicines you are taking. These include vitamins, herbs, eye drops, creams, and over-the-counter medicines. Any problems you or family members have had with medicines that make you fall asleep (anesthetics). Any blood disorders you have. Any surgeries you have had. Any medical conditions you have, such as kidney failure. Whether you are pregnant or may be pregnant. What are the risks? This is a safe procedure. But problems may occur, including: Infection. Bruising and bleeding. Bleeding into the chest. Stroke or blood clots. Damage  to nearby areas of your body. Allergies to medicines or dyes. The need for a pacemaker if the normal system is damaged. Failure of the procedure to treat the problem. What happens before the procedure? Medicines Ask your doctor about: Changing or stopping your normal medicines. This is important. Taking aspirin and ibuprofen. Do not take these medicines unless your doctor tells you to take them. Taking other medicines, vitamins, herbs, and supplements. General instructions Follow instructions from your doctor about what you cannot eat or drink. Plan to have someone take you home from the hospital or clinic. If you will be going home right after the procedure, plan to have someone with you for 24 hours. Ask your doctor what steps will be taken to prevent infection. What happens during the procedure?  An IV tube will be put into one of your veins. You will be given a medicine to help you relax. The skin on your neck or groin will be numbed. A cut (incision) will be made in your neck or groin. A needle will be put through your cut and into a large vein. A tube (catheter) will be put into the needle. The tube will be moved to your heart. Dye may be put through the tube. This helps your doctor see your heart. Small devices (electrodes) on the tube will send out signals. A type of energy will be used to destroy some heart tissue. The tube will be taken out. Pressure will be held on your cut. This helps stop bleeding. A bandage will be put over your cut. The exact procedure may vary among doctors and hospitals. What happens after the procedure? You will be watched until you  leave the hospital or clinic. This includes checking your heart rate, breathing rate, oxygen, and blood pressure. Your cut will be watched for bleeding. You will need to lie still for a few hours. Do not drive for 24 hours or as long as your doctor tells you. Summary Cardiac ablation is a procedure to destroy some heart  tissue. This is done to treat heart rhythm problems. Tell your doctor about any medical conditions you may have. Tell him or her about all medicines you are taking to treat them. This is a safe procedure. But problems may occur. These include infection, bruising, bleeding, and damage to nearby areas of your body. Follow what your doctor tells you about food and drink. You may also be told to change or stop some of your medicines. After the procedure, do not drive for 24 hours or as long as your doctor tells you. This information is not intended to replace advice given to you by your health care provider. Make sure you discuss any questions you have with your healthcare provider. Document Revised: 09/08/2019 Document Reviewed: 09/08/2019 Elsevier Patient Education  2022 [employer].

## 2021-05-23 ENCOUNTER — Ambulatory Visit: Payer: Medicare Other | Admitting: Family Medicine

## 2021-05-26 ENCOUNTER — Other Ambulatory Visit: Payer: Self-pay | Admitting: Family Medicine

## 2021-05-26 DIAGNOSIS — I10 Essential (primary) hypertension: Secondary | ICD-10-CM

## 2021-05-28 ENCOUNTER — Telehealth: Payer: Self-pay | Admitting: Pharmacist

## 2021-05-28 DIAGNOSIS — F17200 Nicotine dependence, unspecified, uncomplicated: Secondary | ICD-10-CM

## 2021-05-28 NOTE — Telephone Encounter (Signed)
-----   Message from Leavy Cella, [place] sent at 05/17/2021  3:42 PM EDT ----- Regarding: Tobacco Cessation - DM

## 2021-05-28 NOTE — Telephone Encounter (Signed)
Patient contacted for follow/up of tobacco intake reduction / tobacco cessation attempt. Patient reports receiving nicotine patches from [employer] support service.  She started her first patch today!.  She reports that she has NOT smoked at all today.   Since last contact patient reports reducing to intake of tobacco(cigarettes) to ~ 7 cigarette over the last several days.    Medications currently being used; Nicotine patch  Rates IMPORTANCE of quitting tobacco very high Rates CONFIDENCE of quitting tobacco as high  Most common triggers to use tobacco include; after meals   Also discussed blood sugar control - patient is not checking CBGs currently.  Denies symptoms of hyper or hypoglycemia.   Total time with patient call and documentation of interaction: 12 minutes.  F/U Phone call planned: 1 week to assess progress with tobacco cessation.

## 2021-05-28 NOTE — Telephone Encounter (Signed)
Noted and agree. 

## 2021-05-28 NOTE — Assessment & Plan Note (Signed)
Patient contacted for follow/up of tobacco intake reduction / tobacco cessation attempt. Patient reports receiving nicotine patches from [employer] support service.  She started her first patch today!.  She reports that she has NOT smoked at all today.   Since last contact patient reports reducing to intake of tobacco(cigarettes) to ~ 7 cigarette over the last several days.    Medications currently being used; Nicotine patch  Rates IMPORTANCE of quitting tobacco very high Rates CONFIDENCE of quitting tobacco as high  Most common triggers to use tobacco include; after meals

## 2021-06-03 ENCOUNTER — Telehealth: Payer: Self-pay | Admitting: Pharmacist

## 2021-06-03 NOTE — Telephone Encounter (Signed)
-----   Message from Leavy Cella, [place] sent at 05/28/2021  1:56 PM EDT ----- Regarding: Tobacco Cessatin Follow-up

## 2021-06-03 NOTE — Telephone Encounter (Signed)
Attempted to contact patient for follow-up of tobacco intake reduction / cessation.   Left HIPAA compliant voice mail requesting call back to direct phone: [phone]   Plan to attempt follow-up again in 2-3 days.

## 2021-06-04 ENCOUNTER — Ambulatory Visit (INDEPENDENT_AMBULATORY_CARE_PROVIDER_SITE_OTHER): Payer: Medicare Other | Admitting: Neurology

## 2021-06-04 ENCOUNTER — Telehealth: Payer: Self-pay | Admitting: *Deleted

## 2021-06-04 ENCOUNTER — Other Ambulatory Visit: Payer: Self-pay

## 2021-06-04 DIAGNOSIS — G5603 Carpal tunnel syndrome, bilateral upper limbs: Secondary | ICD-10-CM | POA: Diagnosis not present

## 2021-06-04 NOTE — Procedures (Signed)
[facility]  [place], [place]  [place], [place] [address] Tel: [phone] Fax:  [phone] Test Date:  06/04/2021  Patient: Hailey Barnes DOB: 04-Nov-1960 Physician: Narda Amber, DO  Sex: Female Height: 5' 6"  Ref Phys: Michiel Cowboy, MD  ID#: [id number]   Technician:    Patient Complaints: This is a 60 year old female referred for evaluation of bilateral hand paresthesias.  NCV & EMG Findings: Extensive electrodiagnostic testing of the right upper extremity and additional studies of the left shows:  Right median sensory response is absent.  Left median sensory response shows prolonged latency (4.9 ms) and reduced amplitude (10.3 V).  Bilateral ulnar sensory responses are within normal limits. Bilateral median motor responses show prolonged latency (R7.2, L5.5 ms).  Left ulnar motor response shows asymmetrically reduced amplitude (L7.8, R14.0 mV) and slowed conduction velocity across the elbow (A Elbow-B Elbow, 32 m/s).  Right ulnar motor responses within normal limits.   Chronic motor axonal loss changes are seen affecting the right abductor pollicis brevis muscle, without accompanying active denervation.    Impression: Bilateral median neuropathy at or distal to the wrist, consistent with a clinical diagnosis of carpal tunnel syndrome.  Overall, these findings are severe on the right and moderate on the left. Left ulnar neuropathy with slowing across the elbow, purely demyelinating, mild.   ___________________________ Narda Amber, DO    Nerve Conduction Studies Anti Sensory Summary Table   Stim Site NR Peak (ms) Norm Peak (ms) P-T Amp (V) Norm P-T Amp  Left Median Anti Sensory (2nd Digit)  34C  Wrist    4.9 <3.6 10.3 >15  Right Median Anti Sensory (2nd Digit)  34C  Wrist NR  <3.6  >15  Left Ulnar Anti Sensory (5th Digit)  34C  Wrist    2.9 <3.1 21.1 >10  Right Ulnar Anti Sensory (5th Digit)  34C  Wrist    2.8 <3.1 26.1 >10   Motor Summary  Table   Stim Site NR Onset (ms) Norm Onset (ms) O-P Amp (mV) Norm O-P Amp Site1 Site2 Delta-0 (ms) Dist (cm) Vel (m/s) Norm Vel (m/s)  Left Median Motor (Abd Poll Brev)  34C  Wrist    5.5 <4.0 9.1 >6 Elbow Wrist 5.3 29.0 55 >50  Elbow    10.8  8.7         Right Median Motor (Abd Poll Brev)  34C  Wrist    7.2 <4.0 6.2 >6 Elbow Wrist 5.0 29.0 58 >50  Elbow    12.2  5.8         Left Ulnar Motor (Abd Dig Minimi)  34C  Wrist    2.0 <3.1 7.8 >7 B Elbow Wrist 3.7 23.0 62 >50  B Elbow    5.7  6.7  A Elbow B Elbow 3.1 10.0 32 >50  A Elbow    8.8  6.5         Right Ulnar Motor (Abd Dig Minimi)  34C  Wrist    2.7 <3.1 14.0 >7 B Elbow Wrist 3.8 25.0 66 >50  B Elbow    6.5  13.8  A Elbow B Elbow 1.5 10.0 67 >50  A Elbow    8.0  13.7          EMG   Side Muscle Ins Act Fibs Psw Fasc Number Recrt Dur Dur. Amp Amp. Poly Poly. Comment  Right 1stDorInt Nml Nml Nml Nml Nml Nml Nml Nml Nml Nml Nml Nml N/A  Right Abd [place]  Brev Nml Nml Nml Nml 1- Rapid Few 1+ Few 1+ Few 1+ N/A  Right PronatorTeres Nml Nml Nml Nml Nml Nml Nml Nml Nml Nml Nml Nml N/A  Right Biceps Nml Nml Nml Nml Nml Nml Nml Nml Nml Nml Nml Nml N/A  Right Triceps Nml Nml Nml Nml Nml Nml Nml Nml Nml Nml Nml Nml N/A  Right Deltoid Nml Nml Nml Nml Nml Nml Nml Nml Nml Nml Nml Nml N/A  Left 1stDorInt Nml Nml Nml Nml Nml Nml Nml Nml Nml Nml Nml Nml N/A  Left Abd Poll Brev Nml Nml Nml Nml Nml Nml Nml Nml Nml Nml Nml Nml N/A  Left PronatorTeres Nml Nml Nml Nml Nml Nml Nml Nml Nml Nml Nml Nml N/A  Left Biceps Nml Nml Nml Nml Nml Nml Nml Nml Nml Nml Nml Nml N/A  Left Triceps Nml Nml Nml Nml Nml Nml Nml Nml Nml Nml Nml Nml N/A  Left Deltoid Nml Nml Nml Nml Nml Nml Nml Nml Nml Nml Nml Nml N/A  Left ABD Dig Min Nml Nml Nml Nml Nml Nml Nml Nml Nml Nml Nml Nml N/A  Left FlexCarpiUln Nml Nml Nml Nml Nml Nml Nml Nml Nml Nml Nml Nml N/A      Waveforms:

## 2021-06-04 NOTE — Chronic Care Management (AMB) (Signed)
  Care Management   Note  06/04/2021 Name: Hailey Barnes MRN: [id number] DOB: 08/30/1961  Hailey Barnes is a 60 y.o. year old female who is a primary care patient of Lurline Del, DO and is actively engaged with the care management team. I reached out to [name] by phone today to assist with scheduling a follow up visit with the RN Case Manager  Follow up plan: Unsuccessful telephone outreach attempt made. A HIPAA compliant phone message was left for the patient providing contact information and requesting a return call. The care management team will reach out to the patient again over the next 7 days. If patient returns call to provider office, please advise to call [place] at [phone].  [facility]  Direct Dial: [phone]

## 2021-06-05 NOTE — Progress Notes (Signed)
Nerve conduction study shows severe right-sided carpal tunnel syndrome and medium left.  You also have a problem with the ulnar nerve on the left.  Generally I think referral to hand surgery for surgical consultation is reasonable for severe carpal tunnel syndrome results of the nerve conduction study.  Would you like me to place a referral to discuss surgical options now?  Or would you like me to wait until the symptoms start coming back more significantly?

## 2021-06-13 NOTE — Chronic Care Management (AMB) (Signed)
  Care Management   Note  06/13/2021 Name: [name] MRN: [id number] DOB: 03-Apr-1961  Hailey Barnes is a 60 y.o. year old female who is a primary care patient of Lurline Del, DO and is actively engaged with the care management team. I reached out to [name] by phone today to assist with scheduling a follow up visit with the RN Case Manager  Follow up plan: A second unsuccessful telephone outreach attempt made. A HIPAA compliant phone message was left for the patient providing contact information and requesting a return call.  The care management team will reach out to the patient again over the next 7 days.  If patient returns call to provider office, please advise to call [place] at [facility]  Direct Dial: [phone]

## 2021-06-14 ENCOUNTER — Telehealth: Payer: Self-pay | Admitting: Pharmacist

## 2021-06-14 NOTE — Telephone Encounter (Signed)
-----   Message from Leavy Cella, [place] sent at 06/03/2021 11:13 AM EDT ----- Regarding: Tobacco Cessation ? Using Patch

## 2021-06-14 NOTE — Telephone Encounter (Signed)
Attempted to contact patient for follow-up of tobacco intake reduction / cessation.   Left HIPAA compliant voice mail requesting call back to direct phone: [phone]   Plan to attempt follow-up again in 5-7 days.

## 2021-06-19 ENCOUNTER — Other Ambulatory Visit: Payer: Self-pay | Admitting: Family Medicine

## 2021-06-20 ENCOUNTER — Other Ambulatory Visit: Payer: Self-pay

## 2021-06-20 ENCOUNTER — Other Ambulatory Visit: Payer: Medicare Other

## 2021-06-20 DIAGNOSIS — I471 Supraventricular tachycardia: Secondary | ICD-10-CM

## 2021-06-20 LAB — CBC WITH DIFFERENTIAL/PLATELET
Basophils Absolute: 0 10*3/uL (ref 0.0–0.2)
Basos: 0 %
EOS (ABSOLUTE): 0.1 10*3/uL (ref 0.0–0.4)
Eos: 1 %
Hematocrit: 46.5 % (ref 34.0–46.6)
Hemoglobin: 15.9 g/dL (ref 11.1–15.9)
Lymphocytes Absolute: 3.9 10*3/uL — ABNORMAL HIGH (ref 0.7–3.1)
Lymphs: 29 %
MCH: 29.1 pg (ref 26.6–33.0)
MCHC: 34.2 g/dL (ref 31.5–35.7)
MCV: 85 fL (ref 79–97)
Monocytes Absolute: 1.1 10*3/uL — ABNORMAL HIGH (ref 0.1–0.9)
Monocytes: 9 %
Neutrophils Absolute: 8 10*3/uL — ABNORMAL HIGH (ref 1.4–7.0)
Neutrophils: 61 %
Platelets: 223 10*3/uL (ref 150–450)
RBC: 5.46 x10E6/uL — ABNORMAL HIGH (ref 3.77–5.28)
RDW: 15.3 % (ref 11.7–15.4)
WBC: 13.2 10*3/uL — ABNORMAL HIGH (ref 3.4–10.8)

## 2021-06-20 LAB — BASIC METABOLIC PANEL
BUN/Creatinine Ratio: 21 (ref 9–23)
BUN: 23 mg/dL (ref 6–24)
CO2: 24 mmol/L (ref 20–29)
Calcium: 9.7 mg/dL (ref 8.7–10.2)
Chloride: 105 mmol/L (ref 96–106)
Creatinine, Ser: 1.1 mg/dL — ABNORMAL HIGH (ref 0.57–1.00)
Glucose: 128 mg/dL — ABNORMAL HIGH (ref 65–99)
Potassium: 4.3 mmol/L (ref 3.5–5.2)
Sodium: 136 mmol/L (ref 134–144)
eGFR: 58 mL/min/{1.73_m2} — ABNORMAL LOW (ref >59)

## 2021-06-20 NOTE — Chronic Care Management (AMB) (Signed)
  Care Management   Note  06/20/2021 Name: Hailey Barnes MRN: [id number] DOB: 1961/09/24  Hailey Barnes is a 60 y.o. year old female who is a primary care patient of Lurline Del, DO and is actively engaged with the care management team. I reached out to [name] by phone today to assist with re-scheduling a follow up visit with the RN Case Manager  Follow up plan: Telephone appointment with care management team member scheduled for:07/08/2021  Haig Gerardo  Care Guide, Embedded Care Coordination [place]  Care Management  Direct Dial: [phone]

## 2021-06-21 ENCOUNTER — Telehealth: Payer: Self-pay | Admitting: Pharmacist

## 2021-06-21 NOTE — Telephone Encounter (Signed)
Attempted to contact patient for follow-up of tobacco intake reduction / cessation.   Left HIPAA compliant voice mail requesting call back to direct phone: [phone]    Plan to attempt follow-up again in 1 month if no call back from patient.

## 2021-06-21 NOTE — Telephone Encounter (Signed)
-----   Message from Leavy Cella, [place] sent at 06/14/2021  1:10 PM EDT -----

## 2021-06-25 ENCOUNTER — Telehealth: Payer: Self-pay | Admitting: Internal Medicine

## 2021-06-25 NOTE — Telephone Encounter (Signed)
   Pt is calling, she needs to get information about her upcoming procedure, she said she lost her AVS and doesn't know what time she needs to be at the hospital for her procedure

## 2021-06-25 NOTE — Telephone Encounter (Signed)
Left detailed message for patient with her arrival time for ablation scheduled on 9/14. I advised that I am sending the instructions letter through her MyChart and to call back if she has additional questions.

## 2021-06-26 ENCOUNTER — Encounter: Payer: Self-pay | Admitting: Family Medicine

## 2021-06-26 ENCOUNTER — Other Ambulatory Visit: Payer: Self-pay

## 2021-06-26 ENCOUNTER — Other Ambulatory Visit (HOSPITAL_COMMUNITY)
Admission: RE | Admit: 2021-06-26 | Discharge: 2021-06-26 | Disposition: A | Discharge status: Home / Self Care | Payer: Medicare Other | Source: Ambulatory Visit | Attending: Family Medicine | Admitting: Family Medicine

## 2021-06-26 ENCOUNTER — Ambulatory Visit (INDEPENDENT_AMBULATORY_CARE_PROVIDER_SITE_OTHER): Payer: Medicare Other | Admitting: Family Medicine

## 2021-06-26 VITALS — BP 112/80 | HR 80 | Ht 62.0 in | Wt 276.0 lb

## 2021-06-26 DIAGNOSIS — N766 Ulceration of vulva: Secondary | ICD-10-CM

## 2021-06-26 DIAGNOSIS — N898 Other specified noninflammatory disorders of vagina: Secondary | ICD-10-CM | POA: Insufficient documentation

## 2021-06-26 DIAGNOSIS — R300 Dysuria: Secondary | ICD-10-CM

## 2021-06-26 LAB — POCT UA - MICROSCOPIC ONLY

## 2021-06-26 LAB — POCT URINALYSIS DIP (MANUAL ENTRY)
Bilirubin, UA: NEGATIVE
Blood, UA: NEGATIVE
Glucose, UA: >=1000 mg/dL — AB
Ketones, POC UA: NEGATIVE mg/dL
Leukocytes, UA: NEGATIVE
Nitrite, UA: NEGATIVE
Protein Ur, POC: NEGATIVE mg/dL
Spec Grav, UA: 1.02 (ref 1.010–1.025)
Urobilinogen, UA: 0.2 E.U./dL
pH, UA: 5.5 (ref 5.0–8.0)

## 2021-06-26 NOTE — Assessment & Plan Note (Addendum)
Wide differential for symptoms including UTI for increased urinary frequency, could also consider bacterial vaginitis or Candida we will vaginitis given color and burning associated with vaginal discharge.  Patient is sexually active so could also consider sexually transmitted infections.  Patient is postmenopausal and could be experiencing vaginal irritation from decrease estrogen promoting lubrication.  We will work to rule out infectious causes first. External shallow ulcer concerning for HSV vs syphilis chancre -Gonorrhea chlamydia testing -Urine analysis -HIV -RPR -HSV culture  -Follow-up with patient once results are available and treat as appropriate

## 2021-06-26 NOTE — Progress Notes (Signed)
    SUBJECTIVE:   CHIEF COMPLAINT / HPI: Vaginal discharge  Vaginal discharge Patient reports that for the last 2 weeks she has been experiencing clear vaginal discharge.  She reports that her last period was 3 years ago.  She reports that since undergoing menopause she has not had any vaginal discharge and is usually "dry".  Patient states that the discharge is not associated with any itching.  She does report that she is sexually active with a monogamous partner.  She denies any lower abdominal pain but does have some right sided inguinal discomfort at times.  She denies any fevers or chills.  She reports increased urinary urgency but no dysuria or hematuria.  Patient states that often experiences burning sensation after intercourse.  She reports increased libido.  Patient states that she also uses a face tollway.  She claims after each use with warm water.  Patient denies any vaginal bleeding.  PERTINENT  PMH / PSH:  Hypertension Heart failure Type 2 diabetes  OBJECTIVE:   BP 112/80   Pulse 80   Ht 5' 2"  (1.575 m)   Wt 276 lb (125.2 kg)   LMP 04/06/2017   SpO2 96%   BMI 50.48 kg/m   General: Female appearing stated age sitting on exam table in no acute distress Genitalia:  Normal introitus for age, single external ulcer on right labia majora no bleeding or purulent drainage/shallow, scant white vaginal discharge, mucosa pink and moist, no vaginal or cervical lesions, no vaginal atrophy, no friaility or hemorrhage  ASSESSMENT/PLAN:   Vaginal discharge Wide differential for symptoms including UTI for increased urinary frequency, could also consider bacterial vaginitis or Candida we will vaginitis given color and burning associated with vaginal discharge.  Patient is sexually active so could also consider sexually transmitted infections.  Patient is postmenopausal and could be experiencing vaginal irritation from decrease estrogen promoting lubrication.  We will work to rule out  infectious causes first. External shallow ulcer concerning for HSV vs syphilis chancre -Gonorrhea chlamydia testing -Urine analysis -HIV -RPR -HSV culture  -Follow-up with patient once results are available and treat as appropriate     Eulis Foster, MD [place]

## 2021-06-26 NOTE — Progress Notes (Signed)
dip

## 2021-06-26 NOTE — Patient Instructions (Signed)
Today we will complete testing of urine samples as well as a vaginal swab to test for sexually transmitted infections as well as any type of urinary tract infection.  I will follow with you once results are available.  In the meantime I recommend continuing to drink plenty of fluids to remain hydrated and not introducing any new substances or medications your vaginal area.

## 2021-06-27 LAB — CERVICOVAGINAL ANCILLARY ONLY
Bacterial Vaginitis (gardnerella): NEGATIVE
Candida Glabrata: NEGATIVE
Candida Vaginitis: NEGATIVE
Chlamydia: NEGATIVE
Comment: NEGATIVE
Comment: NEGATIVE
Comment: NEGATIVE
Comment: NEGATIVE
Comment: NEGATIVE
Comment: NORMAL
Neisseria Gonorrhea: NEGATIVE
Trichomonas: NEGATIVE

## 2021-06-27 LAB — HIV ANTIBODY (ROUTINE TESTING W REFLEX): HIV Screen 4th Generation wRfx: NONREACTIVE

## 2021-06-27 LAB — RPR: RPR Ser Ql: NONREACTIVE

## 2021-06-29 LAB — HERPES SIMPLEX VIRUS CULTURE

## 2021-07-01 DIAGNOSIS — R319 Hematuria, unspecified: Secondary | ICD-10-CM | POA: Diagnosis not present

## 2021-07-01 DIAGNOSIS — N3001 Acute cystitis with hematuria: Secondary | ICD-10-CM | POA: Diagnosis not present

## 2021-07-02 ENCOUNTER — Ambulatory Visit (HOSPITAL_COMMUNITY): Payer: Medicare Other | Admitting: Anesthesiology

## 2021-07-02 NOTE — Pre-Procedure Instructions (Signed)
Instructed patient on the following items: Arrival time 0930 Nothing to eat or drink after midnight No meds AM of procedure Responsible person to drive you home and stay with you for 24 hrs

## 2021-07-03 ENCOUNTER — Encounter (HOSPITAL_COMMUNITY)
Admission: RE | Disposition: A | Discharge status: Home / Self Care | Payer: Medicare Other | Source: Home / Self Care | Attending: Internal Medicine

## 2021-07-03 ENCOUNTER — Other Ambulatory Visit: Payer: Self-pay

## 2021-07-03 ENCOUNTER — Ambulatory Visit (HOSPITAL_COMMUNITY)
Admission: RE | Admit: 2021-07-03 | Discharge: 2021-07-03 | Disposition: A | Discharge status: Home / Self Care | Payer: Medicare Other | Attending: Internal Medicine | Admitting: Internal Medicine

## 2021-07-03 DIAGNOSIS — N390 Urinary tract infection, site not specified: Secondary | ICD-10-CM | POA: Insufficient documentation

## 2021-07-03 DIAGNOSIS — Z539 Procedure and treatment not carried out, unspecified reason: Secondary | ICD-10-CM | POA: Insufficient documentation

## 2021-07-03 LAB — GLUCOSE, CAPILLARY: Glucose-Capillary: 119 mg/dL — ABNORMAL HIGH (ref 70–99)

## 2021-07-03 SURGERY — SVT ABLATION
Anesthesia: General

## 2021-07-03 MED ORDER — SODIUM CHLORIDE 0.9 % IV SOLN: INTRAVENOUS | Status: DC

## 2021-07-03 MED ORDER — ACETAMINOPHEN 500 MG PO TABS
1000.0000 mg | ORAL_TABLET | Freq: Once | ORAL | Status: AC
  Administered 2021-07-03: 1000 mg via ORAL
  Filled 2021-07-03 (×2): qty 2

## 2021-07-03 NOTE — Anesthesia Preprocedure Evaluation (Addendum)
Anesthesia Evaluation  Patient identified by MRN, date of birth, ID band Patient awake    Reviewed: Allergy & Precautions, NPO status , Patient's Chart, lab work & pertinent test results, reviewed documented beta blocker date and time   History of Anesthesia Complications (+) Family history of anesthesia reaction and history of anesthetic complications  Airway Mallampati: II  TM Distance: >3 FB Neck ROM: Full    Dental  (+) Teeth Intact, Dental Advisory Given   Pulmonary sleep apnea and Continuous Positive Airway Pressure Ventilation , COPD,  COPD inhaler, Current SmokerPatient did not abstain from smoking.,    Pulmonary exam normal breath sounds clear to auscultation       Cardiovascular hypertension, Pt. on medications and Pt. on home beta blockers (-) angina+CHF  (-) Past MI + dysrhythmias Supra Ventricular Tachycardia + Valvular Problems/Murmurs MVP and MR  Rhythm:Regular Rate:Normal     Neuro/Psych  Headaches, PSYCHIATRIC DISORDERS Depression  Neuromuscular disease CVA, Residual Symptoms    GI/Hepatic negative GI ROS, NASH    Endo/Other  diabetes ([place]), Type 2Morbid obesity (BMI 50)  Renal/GU negative Renal ROS     Musculoskeletal  (+) Arthritis ,   Abdominal   Peds  Hematology negative hematology ROS (+)   Anesthesia Other Findings Day of surgery medications reviewed with the patient.  Reproductive/Obstetrics                            Anesthesia Physical Anesthesia Plan  ASA: 4  Anesthesia Plan: General   Post-op Pain Management:    Induction: Intravenous  PONV Risk Score and Plan: 2 and Dexamethasone and Ondansetron  Airway Management Planned: Oral ETT  Additional Equipment:   Intra-op Plan:   Post-operative Plan:   Informed Consent: I have reviewed the patients History and Physical, chart, labs and discussed the procedure including the risks, benefits and  alternatives for the proposed anesthesia with the patient or authorized representative who has indicated his/her understanding and acceptance.     Dental advisory given  Plan Discussed with: CRNA  Anesthesia Plan Comments:         Anesthesia Quick Evaluation

## 2021-07-03 NOTE — H&P (Signed)
The patient presents for ablation.  On review, she has UTI symptoms, ongoing.  She was diagnosed with UTI 07/01/21.  She called our office yesterday to let us know that symptoms had resolved.  Today, she reports that she was called (while in short stay) but [facility] physician and told that her urine culture revealed a rare bacteria and that she needed to have her antibiotics changed for better coverage.  Today, she reports ongoing R flank pain.  After long discussion with the patient, I have decided to cancel her procedure for today.  Given the elective nature of the procedure and risks of bacteremia, worsening infection if we proceed, I feel that risks/ benefit ratio would suggest that we should return after her UTI is treated adequately.  We will therefore reschedule.  Thompson Grayer MD, [facility] 07/03/2021 11:26 AM

## 2021-07-08 ENCOUNTER — Ambulatory Visit: Payer: Medicare Other

## 2021-07-08 NOTE — Chronic Care Management (AMB) (Signed)
Chronic Care Management   CCM RN Visit Note  07/08/2021 Name: Hailey Barnes MRN: [id number] DOB: [birth year]  Subjective: Claudean Leavelle is a 60 y.o. year old female who is a primary care patient of Lurline Del, DO. The care management team was consulted for assistance with disease management and care coordination needs.    Engaged with patient by telephone for follow up visit in response to provider referral for case management and/or care coordination services.   Consent to Services:  The patient was given information about Chronic Care Management services, agreed to services, and gave verbal consent prior to initiation of services.  Please see initial visit note for detailed documentation.   Patient agreed to services and verbal consent obtained.    Assessment:  The patient continues to maintain positive progress with care plan goals.. See Care Plan below for interventions and patient self-care actives. Follow up Plan: Patient would like continued follow-up.  CCM RNCM will outreach the patient within the next 6 weeks.  Patient will call office if needed prior to next encounter t: Review of patient past medical history, allergies, medications, health status, including review of consultants reports, laboratory and other test data, was performed as part of comprehensive evaluation and provision of chronic care management services.   SDOH (Social Determinants of Health) assessments and interventions performed:    CCM Care Plan  Allergies  Allergen Reactions   Bupropion Other (See Comments)    Throat soreness and difficulty swallowing.    Norvasc [Amlodipine] Swelling    Significant leg swelling with 64m. Able to tolerate 2.550m     Outpatient Encounter Medications as of 07/08/2021  Medication Sig Note   acetaminophen (TYLENOL) 500 MG tablet Take 1,000 mg by mouth every 6 (six) hours as needed for moderate pain or headache.    albuterol (VENTOLIN HFA) 108 (90 Base)  MCG/ACT inhaler Inhale 2 puffs into the lungs every 6 (six) hours as needed for wheezing or shortness of breath.    allopurinol (ZYLOPRIM) 300 MG tablet TAKE 1 TABLET BY MOUTH EVERY DAY (Patient taking differently: Take 300 mg by mouth daily.)    amLODipine (NORVASC) 2.5 MG tablet Take 1 tablet (2.5 mg total) by mouth at bedtime.    aspirin EC 81 MG tablet Take 81 mg by mouth daily.    atorvastatin (LIPITOR) 40 MG tablet TAKE 1 TABLET BY MOUTH ONCE DAILY    Cholecalciferol (VITAMIN D) 50 MCG (2000 UT) tablet Take 4,000 Units by mouth daily.    empagliflozin (JARDIANCE) 25 MG TABS tablet Take 1 tablet (25 mg total) by mouth daily.    Fluticasone-Umeclidin-Vilant (TRELEGY ELLIPTA) 200-62.5-25 MCG/INH AEPB Inhale 1 puff into the lungs daily.    glucose blood (ACCU-CHEK AVIVA) test strip Use to check sugar up to 3 times daily    hydrALAZINE (APRESOLINE) 25 MG tablet Take 2 tablets (50 mg total) by mouth 3 (three) times daily.    liraglutide (VICTOZA) 18 MG/3ML SOPN Inject 0.6 mg into the skin daily. Inject 0.3.7DSinus 2 clicks under the skin daily. (Patient taking differently: Inject 0.6 mg into the skin daily.)    losartan (COZAAR) 100 MG tablet Take 1 tablet (100 mg total) by mouth daily. (Patient taking differently: Take 100 mg by mouth at bedtime.)    Melaton-Thean-Cham-PassF-LBalm (MELATONIN + L-THEANINE) CAPS Take 2 capsules by mouth at bedtime as needed (sleep).    meloxicam (MOBIC) 15 MG tablet TAKE 1 TABLET (15 MG TOTAL) BY MOUTH DAILY. (Patient taking differently: Take  15 mg by mouth daily as needed for pain.)    Multiple Vitamins-Minerals (MULTIVITAMIN GUMMIES ADULT PO) Take 2 capsules by mouth daily.    pindolol (VISKEN) 10 MG tablet Take 2 tablets (20 mg total) by mouth 2 (two) times daily.    Polyvinyl Alcohol-Povidone (CLEAR EYES ALL SEASONS OP) Place 1 drop into both eyes daily as needed (allergies/itchy eyes).    Probiotic Product (PROBIOTIC PO) Take 2 capsules by mouth daily.     spironolactone (ALDACTONE) 100 MG tablet Take 1 tablet (100 mg total) by mouth at bedtime.    sulfamethoxazole-trimethoprim (BACTRIM DS) 800-160 MG tablet Take 1 tablet by mouth 2 (two) times daily.    torsemide (DEMADEX) 20 MG tablet TAKE 1 TABLET BY MOUTH EVERY DAY MAY TAKE 1 EXTRA TABLET DAILY ONLY AS NEEDED (Patient taking differently: Take 20 mg by mouth every other day.)    triamcinolone (KENALOG) 0.025 % ointment Apply 1 application topically 2 (two) times daily as needed. (Patient taking differently: Apply 1 application topically 2 (two) times daily as needed (irritation).)    Lancets (ACCU-CHEK SOFT TOUCH) lancets Use as instructed    nicotine (NICODERM CQ - DOSED IN MG/24 HOURS) 21 mg/24hr patch Place 1 patch (21 mg total) onto the skin daily. 06/27/2021: Pt hasnt started yet   nicotine polacrilex (COMMIT) 2 MG lozenge Take 1 lozenge (2 mg total) by mouth as needed for smoking cessation. 06/27/2021: Pt hasnt started yet   nitrofurantoin, macrocrystal-monohydrate, (MACROBID) 100 MG capsule Take 100 mg by mouth 2 (two) times daily. (Patient not taking: Reported on 07/08/2021)    phenazopyridine (PYRIDIUM) 200 MG tablet Take 200 mg by mouth 3 (three) times daily as needed for pain. (Patient not taking: Reported on 07/08/2021)    No facility-administered encounter medications on file as of 07/08/2021.    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   Lesion of cervix 07/05/2020   COPD (chronic obstructive pulmonary disease) ([place]) 85/46/2703   Plica of knee, left 50/06/3817   Chondromalacia, left knee 08/16/2018   Primary osteoarthritis of both knees 03/24/2018   Bilateral primary osteoarthritis of first carpometacarpal joints 11/16/2017   OSA (obstructive sleep apnea) 10/30/2016   Left lumbar radiculopathy 06/02/2016   Hyperlipidemia 02/07/2016   T2DM (type 2 diabetes mellitus) ([place]) 12/07/2015   Depression 04/19/2015   Diminished vision 06/23/2014   Chronic  diastolic CHF (congestive heart failure) ([place]) 06/13/2014   Restrictive lung disease 05/10/2014   Vaginal discharge 03/22/2014   NASH (nonalcoholic steatohepatitis) 01/04/2014   Hypertension 05/26/2010   Morbid obesity ([place]) 12/17/2006   Tobacco use disorder 12/17/2006    Conditions to be addressed/monitored:HTN  Care Plan : RN Case Manager  Updates made by Lazaro Arms, RN since 07/08/2021 12:00 AM     Problem: Hypertension (Hypertension)   Priority: High  Onset Date: 04/02/2020  Note:   BP Readings from Last 3 Encounters:  07/03/21 126/79  06/26/21 112/80  05/20/21 134/84    Current Barriers:  Knowledge Deficits related to basic understanding of hypertension pathophysiology and self care management  Nurse Case Manager Clinical Goal(s):  Over the next 30 days, patient will demonstrate improved adherence to prescribed treatment plan for hypertension as evidenced by taking all medications as prescribed, monitoring and recording blood pressure as directed, adhering to low sodium/DASH diet  Interventions:  Evaluation of current treatment plan related to hypertension self management and patient's adherence to plan as established by provider. Provided education to patient re: stroke  prevention, s/s of heart attack and stroke, DASH diet, complications of uncontrolled blood pressure Reviewed medications with patient and discussed importance of compliance Discussed plans with patient for ongoing care management follow up and provided patient with direct contact information for care management team Advised patient, providing education and rationale, to monitor blood pressure daily and record, calling PCP for findings outside established parameters.  healthy diet promoted reduction of dietary sodium encouraged medication adherence assessment completed- support and encouragement provided blood pressure trends reviewed-   home or ambulatory blood pressure monitoring encouraged  07/08/21:  I spoke with the patient today, and she states that on 05/02/21, Nicholas Lose Pharm D switched nifedipine to amlodipine 2.5 mg daily and decreased hydralazine from 75 mg TID to 50 mg, and her blood pressure improved. She has been seeing Nicholas Lose, [place] D, and feels he has the right combination for her. She denies any headaches, chest pain, or flushing. She said that her blood pressure today was 120/88. We discussed exercise, and she stated that she does not do much exercise due to her back pain. She says that she has started to monitor her diet. She is watching the sodium and carbs. Advised her to continue her regimen.  Patient Goals/Self Care Activities:  Self administers medications as prescribed Attends all scheduled provider appointments Calls provider office for new concerns, questions, or BP outside discussed parameters Checks BP and records as discussed Follows a low sodium diet/DASH diet       Lazaro Arms RN, BSN, [facility] Care Management Coordinator [place] Phone: [phone] I Fax: [phone]

## 2021-07-08 NOTE — Patient Instructions (Signed)
Visit Information  Hailey Barnes  it was nice speaking with you. Please call me directly [phone] if you have questions about the goals we discussed.   Goals Addressed             This Visit's Progress    Track and Manage My Blood Pressure-Hypertension       Timeframe:  Long-Range Goal Priority:  High Start Date:   04/02/2020                          Expected End Date:    09/18/21                - check blood pressure 3 times per week - choose a place to take my blood pressure (home, clinic or office, retail store) - write blood pressure results in a log or diary    Why is this important?   You won't feel high blood pressure, but it can still hurt your blood vessels.  High blood pressure can cause heart or kidney problems. It can also cause a stroke.  Making lifestyle changes like losing a little weight or eating less salt will help.  Checking your blood pressure at home and at different times of the day can help to control blood pressure.  If the doctor prescribes medicine remember to take it the way the doctor ordered.  Call the office if you cannot afford the medicine or if there are questions about it.     Notes:        The patient verbalizes understanding of the information and instructions discussed today.  Our next appointment is scheduled for  08/19/21.   Please feel free to call me or the office if you have any questions or concerns.  Lazaro Arms RN, BSN, [facility] Care Management Coordinator [place] Phone: [phone] I Fax: [phone]

## 2021-07-09 ENCOUNTER — Other Ambulatory Visit: Payer: Self-pay | Admitting: Family Medicine

## 2021-07-12 DIAGNOSIS — G4733 Obstructive sleep apnea (adult) (pediatric): Secondary | ICD-10-CM | POA: Diagnosis not present

## 2021-07-14 ENCOUNTER — Other Ambulatory Visit: Payer: Self-pay | Admitting: Family Medicine

## 2021-07-18 ENCOUNTER — Telehealth: Payer: Self-pay | Admitting: *Deleted

## 2021-07-18 ENCOUNTER — Encounter: Payer: Self-pay | Admitting: *Deleted

## 2021-07-18 DIAGNOSIS — Z01812 Encounter for preprocedural laboratory examination: Secondary | ICD-10-CM

## 2021-07-18 DIAGNOSIS — I471 Supraventricular tachycardia: Secondary | ICD-10-CM

## 2021-07-18 NOTE — Telephone Encounter (Signed)
Patient picked a date to reschedule ablation and lab work. Will send out new instruction letter over mychart.  Verbalized understanding.

## 2021-07-25 ENCOUNTER — Other Ambulatory Visit: Payer: Self-pay

## 2021-07-25 ENCOUNTER — Ambulatory Visit (INDEPENDENT_AMBULATORY_CARE_PROVIDER_SITE_OTHER): Payer: Medicare Other | Admitting: Podiatry

## 2021-07-25 DIAGNOSIS — E119 Type 2 diabetes mellitus without complications: Secondary | ICD-10-CM

## 2021-07-25 DIAGNOSIS — L84 Corns and callosities: Secondary | ICD-10-CM

## 2021-07-25 DIAGNOSIS — S91209A Unspecified open wound of unspecified toe(s) with damage to nail, initial encounter: Secondary | ICD-10-CM | POA: Diagnosis not present

## 2021-07-25 NOTE — Patient Instructions (Signed)

## 2021-07-26 ENCOUNTER — Telehealth: Payer: Self-pay | Admitting: Pharmacist

## 2021-07-26 NOTE — Telephone Encounter (Signed)
Attempted to contact patient for follow-up of tobacco intake reduction / cessation.    Left HIPAA compliant voice mail requesting call back to direct phone: [phone]    Total time with patient call and documentation of interaction: 6 minutes.  Additional F/U Phone call planned: 4 weeks

## 2021-07-28 ENCOUNTER — Encounter: Payer: Self-pay | Admitting: Podiatry

## 2021-07-28 NOTE — Progress Notes (Signed)
  Subjective:  Patient ID: Hailey Barnes, female    DOB: [birth year],  MRN: [id number]  Chief Complaint  Patient presents with   Nail Problem    right foot pinkey toe injury-redness//left foot great toe callus  pain-diabetic    60 y.o. female presents with the above complaint. History confirmed with patient.  She injured her fifth toenail caught it on furniture and nearly pulled it off completely.  She also has a painful callus on the left great toe. Objective:  Physical Exam: warm, good capillary refill, no trophic changes or ulcerative lesions, and normal DP and PT pulses. Left Foot: Medial pinch callus Right Foot: Loose bleeding fifth toenail, no signs of infection or ulceration  Assessment:   1. Traumatic avulsion of nail plate of toe, initial encounter   2. Callus of foot   3. Type 2 diabetes mellitus without complication, without long-term current use of insulin ([place])      Plan:  Patient was evaluated and treated and all questions answered.  Patient educated on diabetes. Discussed proper diabetic foot care and discussed risks and complications of disease. Educated patient in depth on reasons to return to the office immediately should he/she discover anything concerning or new on the feet. All questions answered. Discussed proper shoes as well.   All symptomatic hyperkeratoses were safely debrided with a sterile #15 blade to patient's level of comfort without incident. We discussed preventative and palliative care of these lesions including supportive and accommodative shoegear, padding, prefabricated and custom molded accommodative orthoses, use of a pumice stone and lotions/creams daily.  The fifth toenail required avulsion.  I removed the nail using a freer elevator following sterile prep with Betadine and digital block with lidocaine.  She tolerated this well.  A dressing was applied.  Post care instructions were given.  Return if symptoms worsen or fail to improve, for  right nail check.

## 2021-07-30 DIAGNOSIS — R81 Glycosuria: Secondary | ICD-10-CM | POA: Diagnosis not present

## 2021-07-30 DIAGNOSIS — R300 Dysuria: Secondary | ICD-10-CM | POA: Diagnosis not present

## 2021-07-30 DIAGNOSIS — M1712 Unilateral primary osteoarthritis, left knee: Secondary | ICD-10-CM | POA: Diagnosis not present

## 2021-07-30 DIAGNOSIS — M1711 Unilateral primary osteoarthritis, right knee: Secondary | ICD-10-CM | POA: Diagnosis not present

## 2021-07-30 DIAGNOSIS — R350 Frequency of micturition: Secondary | ICD-10-CM | POA: Diagnosis not present

## 2021-07-30 DIAGNOSIS — M25562 Pain in left knee: Secondary | ICD-10-CM | POA: Diagnosis not present

## 2021-07-30 DIAGNOSIS — M25561 Pain in right knee: Secondary | ICD-10-CM | POA: Diagnosis not present

## 2021-07-31 ENCOUNTER — Ambulatory Visit: Payer: Medicare Other | Admitting: Internal Medicine

## 2021-08-07 ENCOUNTER — Other Ambulatory Visit: Payer: Medicare Other

## 2021-08-07 ENCOUNTER — Other Ambulatory Visit: Payer: Self-pay | Admitting: Family Medicine

## 2021-08-12 ENCOUNTER — Ambulatory Visit: Payer: Self-pay

## 2021-08-12 ENCOUNTER — Other Ambulatory Visit: Payer: Self-pay

## 2021-08-12 ENCOUNTER — Ambulatory Visit (INDEPENDENT_AMBULATORY_CARE_PROVIDER_SITE_OTHER): Payer: Medicare Other | Admitting: Family Medicine

## 2021-08-12 VITALS — BP 128/82 | HR 80 | Ht 62.0 in | Wt 270.4 lb

## 2021-08-12 DIAGNOSIS — G8929 Other chronic pain: Secondary | ICD-10-CM | POA: Diagnosis not present

## 2021-08-12 DIAGNOSIS — G5601 Carpal tunnel syndrome, right upper limb: Secondary | ICD-10-CM | POA: Diagnosis not present

## 2021-08-12 DIAGNOSIS — M79644 Pain in right finger(s): Secondary | ICD-10-CM

## 2021-08-12 DIAGNOSIS — M1811 Unilateral primary osteoarthritis of first carpometacarpal joint, right hand: Secondary | ICD-10-CM | POA: Diagnosis not present

## 2021-08-12 NOTE — Progress Notes (Signed)
I, Hailey Barnes, LAT, ATC, am serving as scribe for Dr. Lynne Leader.  Hailey Barnes is a 60 y.o. female who presents to [employer] at [facility] today for f/u of R thumb pain.  She was last seen by Dr. [name] on 04/24/21 for f/u of B hand pain and had a R carpal tunnel injection and L thumb injection.  She was referred for an UE  NCV/EMG that she had on 06/04/21.  Today, pt reports R thumb pain has been very bothersome lately. Pt locates pain to [facility] joint. Pt notes decreased grip strength.  She also notes persistent numbness and tingling in her right hand along the median nerve distribution.  She did have a carpal tunnel injection a few months ago and had a nerve conduction study done and August.  She would reluctantly consider surgery.  She has an established relationship with Dr. Amedeo Plenty.  Diagnostic testing: L hand XR- 04/24/21;   Pertinent review of systems: No fevers or chills  Relevant historical information: Hypertension.  CHF.  Diabetes.   Exam:  BP 128/82   Pulse 80   Ht 5' 2"  (1.575 m)   Wt 270 lb 6.4 oz (122.7 kg)   LMP 04/06/2017   SpO2 95%   BMI 49.46 kg/m  General: Well Developed, well nourished, and in no acute distress.   MSK: Right hand mild hypopigmentation at volar wrist. Tender palpation first [place].    Lab and Radiology Results  Procedure: Real-time Ultrasound Guided Injection of right first [facility] Device: Philips Affiniti 50G Images permanently stored and available for review in PACS Verbal informed consent obtained.  Discussed risks and benefits of procedure. Warned about infection bleeding damage to structures skin hypopigmentation and fat atrophy among others. Patient expresses understanding and agreement Time-out conducted.   Noted no overlying erythema, induration, or other signs of local infection.   Skin prepped in a sterile fashion.   Local anesthesia: Topical Ethyl chloride.   With sterile technique and under real time  ultrasound guidance: 20 mg of Kenalog and 0.5 mL of lidocaine injected into first [place]. Fluid seen entering the [facility].   Completed without difficulty   Pain immediately resolved suggesting accurate placement of the medication.   Advised to call if fevers/chills, erythema, induration, drainage, or persistent bleeding.   Images permanently stored and available for review in the ultrasound unit.  Impression: Technically successful ultrasound guided injection.  EXAM: RIGHT HAND - COMPLETE 3+ VIEW   COMPARISON:  None.   FINDINGS: No fracture or malalignment. Mild degenerative change at the first PIP and MCP joints. Mild degenerative change of the second through fifth DIP joints. Minimal degenerative change at the first [facility] joint.   IMPRESSION: Mild arthritic changes involving the thumb and DIP joints of the second through fifth digits. No acute osseous abnormality     Electronically Signed   By: Donavan Foil M.D.   On: 11/02/2017 15:21 I, Lynne Leader, personally (independently) visualized and performed the interpretation of the images attached in this note.    EMG/NVC 06/04/21 Patient: Hailey Barnes DOB: 1960-12-18 Physician: Narda Amber, DO  Sex: Female Height: 5' 6"  Ref Phys: Michiel Cowboy, MD  ID#: [id number]     Technician:      Patient Complaints: This is a 60 year old female referred for evaluation of bilateral hand paresthesias.   NCV & EMG Findings: Extensive electrodiagnostic testing of the right upper extremity and additional studies of the left shows:  Right median sensory response is  absent.  Left median sensory response shows prolonged latency (4.9 ms) and reduced amplitude (10.3 V).  Bilateral ulnar sensory responses are within normal limits. Bilateral median motor responses show prolonged latency (R7.2, L5.5 ms).  Left ulnar motor response shows asymmetrically reduced amplitude (L7.8, R14.0 mV) and slowed conduction velocity across the elbow (A Elbow-B Elbow, 32 m/s).   Right ulnar motor responses within normal limits.   Chronic motor axonal loss changes are seen affecting the right abductor pollicis brevis muscle, without accompanying active denervation.     Impression: Bilateral median neuropathy at or distal to the wrist, consistent with a clinical diagnosis of carpal tunnel syndrome.  Overall, these findings are severe on the right and moderate on the left. Left ulnar neuropathy with slowing across the elbow, purely demyelinating, mild.     ___________________________ Narda Amber, DO     Assessment and Plan: 60 y.o. 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  Plan for steroid injection today.  Additionally patient has right-sided carpal tunnel syndrome.  Patient already had a carpal tunnel steroid injection which provided a little bit of relief.  She has severe carpal tunnel syndrome based on nerve conduction study from August.  I am not optimistic about conservative management and think she would benefit from a surgical consultation with hand surgery.  She has an established relationship with Dr. Amedeo Plenty so referred to him.  Recheck back with me as needed.   PDMP not reviewed this encounter. Orders Placed This Encounter  Procedures   [place] LIMITED JOINT SPACE STRUCTURES UP RIGHT(NO LINKED CHARGES)    Standing Status:   Future    Number of Occurrences:   1    Standing Expiration Date:   02/10/2022    Order Specific Question:   Reason for Exam (SYMPTOM  OR DIAGNOSIS REQUIRED)    Answer:   right thumb pain    Order Specific Question:   Preferred imaging location?    Answer:   [place]   Ambulatory referral to Orthopedic Surgery    Referral Priority:   Routine    Referral Type:   Surgical    Referral Reason:   Specialty Services Required    Requested Specialty:   Orthopedic Surgery    Number of Visits Requested:   1   No orders of the defined types were placed in this encounter.    Discussed warning signs or  symptoms. Please see discharge instructions. Patient expresses understanding.   The above documentation has been reviewed and is accurate and complete Lynne Leader, M.D.

## 2021-08-12 NOTE — Patient Instructions (Addendum)
Thank you for coming in today.   You received a steroid injection today in your right thumb. Seek immediate medical attention if the joint becomes red, extremely painful, or is oozing fluid.   I've referred you to [facility] to see Dr. Amedeo Plenty about your right carpal tunnel.  Recheck with me as needed.

## 2021-08-19 ENCOUNTER — Encounter: Payer: Self-pay | Admitting: Family Medicine

## 2021-08-19 ENCOUNTER — Ambulatory Visit (INDEPENDENT_AMBULATORY_CARE_PROVIDER_SITE_OTHER): Payer: Medicare Other | Admitting: Family Medicine

## 2021-08-19 ENCOUNTER — Other Ambulatory Visit (HOSPITAL_COMMUNITY)
Admission: RE | Admit: 2021-08-19 | Discharge: 2021-08-19 | Disposition: A | Discharge status: Home / Self Care | Payer: Medicare Other | Source: Ambulatory Visit | Attending: Family Medicine | Admitting: Family Medicine

## 2021-08-19 ENCOUNTER — Other Ambulatory Visit: Payer: Self-pay

## 2021-08-19 ENCOUNTER — Ambulatory Visit: Payer: Medicare Other

## 2021-08-19 VITALS — BP 126/80 | HR 81 | Ht 62.0 in | Wt 272.6 lb

## 2021-08-19 DIAGNOSIS — Z23 Encounter for immunization: Secondary | ICD-10-CM | POA: Diagnosis not present

## 2021-08-19 DIAGNOSIS — R300 Dysuria: Secondary | ICD-10-CM | POA: Diagnosis not present

## 2021-08-19 DIAGNOSIS — N390 Urinary tract infection, site not specified: Secondary | ICD-10-CM | POA: Insufficient documentation

## 2021-08-19 LAB — POCT URINALYSIS DIP (MANUAL ENTRY)
Bilirubin, UA: NEGATIVE
Blood, UA: NEGATIVE
Glucose, UA: >=1000 mg/dL — AB
Ketones, POC UA: NEGATIVE mg/dL
Leukocytes, UA: NEGATIVE
Nitrite, UA: NEGATIVE
Protein Ur, POC: NEGATIVE mg/dL
Spec Grav, UA: 1.015 (ref 1.010–1.025)
Urobilinogen, UA: 0.2 E.U./dL
pH, UA: 6 (ref 5.0–8.0)

## 2021-08-19 NOTE — Progress Notes (Signed)
    SUBJECTIVE:   CHIEF COMPLAINT / HPI:   SUBJECTIVE: Hailey Barnes is a 60 y.o. female who complains of urinary frequency, dysuria and right flank pain for the past few weeks. Denies fever, chills, or abnormal vaginal discharge, itching or bleeding. She also endorses her bottom burning. She does endorse recent intercourse.   Was seen by urgent care 10/11 for concern for recurrent UTI. urine culture positive for Proteus - initial Macrobid prescription was changed to Bactrim. Urine was grossly bloody the prior month but has not been since. Treated with Cipro 579m BID for 7 days. Was discussed that high glucose levels in urine from SGLT2 could be contributing to urinary symptoms    OBJECTIVE:   BP 126/80   Pulse 81   Ht 5' 2"  (1.575 m)   Wt 272 lb 9.6 oz (123.7 kg)   LMP 04/06/2017   SpO2 97%   BMI 49.86 kg/m    Physical exam General: well appearing, NAD Cardiovascular: RRR, no murmurs Lungs: CTAB. Normal WOB Abdomen: soft, non-distended, non-tender. No CVA tenderness  Skin: warm, dry. No edema Pelvic exam: normal external genitalia, vulva, vagina, cervix, uterus and adnexa.   ASSESSMENT/PLAN:   No problem-specific Assessment & Plan notes found for this encounter.   Dysuria  Patient endorses dysuria, R flank pain, burning of bottom for about 3 weeks, with 2 UTIs in the past 2 months. On exam no CVA tenderness. Pelvic exam unremarkable and without pain. Obtained UA and urine culture as well as STI testing given unprotected intercourse. Did not preemptively treat UTI due to abx resistance with past UTIs. Discussed various measures to help prevent UTIs. Discussed following up with PCP to potentially discontinue Jardiance given her recurrent UTIs.  - f/u UA, culture, G/C  Health maintenance - flu vaccine   [place]

## 2021-08-19 NOTE — Patient Instructions (Signed)
Visit Information  Ms. Alegria  it was nice speaking with you. Please call me directly [phone] if you have questions about the goals we discussed.   Patient Goals/Self Care Activities:  Self administers medications as prescribed Attends all scheduled provider appointments Calls provider office for new concerns, questions, or BP outside discussed parameters Checks BP and records as discussed Follows a low sodium diet/DASH diet     The patient verbalized understanding of instructions, educational materials, and care plan provided today and declined offer to receive copy of patient instructions, educational materials, and care plan.   Follow up Plan: Patient would like continued follow-up.  CCM RNCM will outreach the patient within the next 6 weeks.  Patient will call office if needed prior to next encounter  Lazaro Arms, RN  [phone]

## 2021-08-19 NOTE — Chronic Care Management (AMB) (Signed)
Chronic Care Management   CCM RN Visit Note  08/19/2021 Name: Hailey Barnes MRN: [id number] DOB: 1961/06/07  Subjective: Hailey Barnes is a 60 y.o. year old female who is a primary care patient of Hailey Del, DO. The care management team was consulted for assistance with disease management and care coordination needs.    Engaged with patient by telephone for follow up visit in response to provider referral for case management and/or care coordination services.   Consent to Services:  The patient was given information about Chronic Care Management services, agreed to services, and gave verbal consent prior to initiation of services.  Please see initial visit note for detailed documentation.   Patient agreed to services and verbal consent obtained.    Assessment: The patient continues to maintain positive progress with care plan goals,but she contiues to experience difficulty with UTI symptoms. See Care Plan below for interventions and patient self-care actives.5 weeks. Follow up Plan: Patient would like continued follow-up.  CCM RNCM  will outreach the patient within the next 6 weeks  Patient will call office if needed prior to next encounter Review of patient past medical history, allergies, medications, health status, including review of consultants reports, laboratory and other test data, was performed as part of comprehensive evaluation and provision of chronic care management services.   SDOH (Social Determinants of Health) assessments and interventions performed:    CCM Care Plan  Allergies  Allergen Reactions   Bupropion Other (See Comments)    Throat soreness and difficulty swallowing.    Norvasc [Amlodipine] Swelling    Significant leg swelling with 58m. Able to tolerate 2.550m     Outpatient Encounter Medications as of 08/19/2021  Medication Sig Note   acetaminophen (TYLENOL) 500 MG tablet Take 1,000 mg by mouth every 6 (six) hours as needed for moderate  pain or headache.    albuterol (VENTOLIN HFA) 108 (90 Base) MCG/ACT inhaler INHALE 1-2 PUFFS BY MOUTH EVERY 6 HOURS AS NEEDED FOR WHEEZE OR SHORTNESS OF BREATH    allopurinol (ZYLOPRIM) 300 MG tablet TAKE 1 TABLET BY MOUTH EVERY DAY (Patient taking differently: Take 300 mg by mouth daily.)    amLODipine (NORVASC) 2.5 MG tablet Take 1 tablet (2.5 mg total) by mouth at bedtime.    aspirin EC 81 MG tablet Take 81 mg by mouth daily.    atorvastatin (LIPITOR) 40 MG tablet TAKE 1 TABLET BY MOUTH ONCE DAILY    Cholecalciferol (VITAMIN D) 50 MCG (2000 UT) tablet Take 4,000 Units by mouth daily.    Fluticasone-Umeclidin-Vilant (TRELEGY ELLIPTA) 200-62.5-25 MCG/INH AEPB Inhale 1 puff into the lungs daily.    glucose blood (ACCU-CHEK AVIVA) test strip Use to check sugar up to 3 times daily    hydrALAZINE (APRESOLINE) 25 MG tablet Take 2 tablets (50 mg total) by mouth 3 (three) times daily.    JARDIANCE 25 MG TABS tablet TAKE 1 TABLET BY MOUTH EVERY DAY    Lancets (ACCU-CHEK SOFT TOUCH) lancets Use as instructed    liraglutide (VICTOZA) 18 MG/3ML SOPN Inject 0.6 mg into the skin daily. Inject 0.6.3ZCinus 2 clicks under the skin daily. (Patient taking differently: Inject 0.6 mg into the skin daily.)    losartan (COZAAR) 100 MG tablet Take 1 tablet (100 mg total) by mouth daily. (Patient taking differently: Take 100 mg by mouth at bedtime.)    Melaton-Thean-Cham-PassF-LBalm (MELATONIN + L-THEANINE) CAPS Take 2 capsules by mouth at bedtime as needed (sleep).    meloxicam (MOBIC) 15 MG  tablet TAKE 1 TABLET (15 MG TOTAL) BY MOUTH DAILY. (Patient taking differently: Take 15 mg by mouth daily as needed for pain.)    Multiple Vitamins-Minerals (MULTIVITAMIN GUMMIES ADULT PO) Take 2 capsules by mouth daily.    nicotine (NICODERM CQ - DOSED IN MG/24 HOURS) 21 mg/24hr patch Place 1 patch (21 mg total) onto the skin daily. 06/27/2021: Pt hasnt started yet   nicotine polacrilex (COMMIT) 2 MG lozenge Take 1 lozenge (2 mg  total) by mouth as needed for smoking cessation. 06/27/2021: Pt hasnt started yet   phenazopyridine (PYRIDIUM) 200 MG tablet Take 200 mg by mouth 3 (three) times daily as needed for pain. (Patient not taking: Reported on 07/08/2021)    pindolol (VISKEN) 10 MG tablet Take 2 tablets (20 mg total) by mouth 2 (two) times daily.    Polyvinyl Alcohol-Povidone (CLEAR EYES ALL SEASONS OP) Place 1 drop into both eyes daily as needed (allergies/itchy eyes).    Probiotic Product (PROBIOTIC PO) Take 2 capsules by mouth daily.    spironolactone (ALDACTONE) 100 MG tablet Take 1 tablet (100 mg total) by mouth at bedtime.    sulfamethoxazole-trimethoprim (BACTRIM DS) 800-160 MG tablet Take 1 tablet by mouth 2 (two) times daily.    torsemide (DEMADEX) 20 MG tablet TAKE 1 TABLET BY MOUTH EVERY DAY MAY TAKE 1 EXTRA TABLET DAILY ONLY AS NEEDED (Patient taking differently: Take 20 mg by mouth every other day.)    triamcinolone (KENALOG) 0.025 % ointment Apply 1 application topically 2 (two) times daily as needed. (Patient taking differently: Apply 1 application topically 2 (two) times daily as needed (irritation).)    No facility-administered encounter medications on file as of 08/19/2021.    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 11/16/2017   OSA (obstructive sleep apnea) 10/30/2016   Left lumbar radiculopathy 06/02/2016   Hyperlipidemia 02/07/2016   T2DM (type 2 diabetes mellitus) ([place]) 12/07/2015   Depression 04/19/2015   Diminished vision 06/23/2014   Chronic diastolic CHF (congestive heart failure) ([place]) 06/13/2014   Restrictive lung disease 05/10/2014   Vaginal discharge 03/22/2014   NASH  (nonalcoholic steatohepatitis) 01/04/2014   Hypertension 05/26/2010   Morbid obesity ([place]) 12/17/2006   Tobacco use disorder 12/17/2006    Conditions to be addressed/monitored:HTN  Care Plan : RN Case Manager  Updates made by Lazaro Arms, RN since 08/19/2021 12:00 AM     Problem: Hypertension (Hypertension)   Priority: High  Onset Date: 04/02/2020  Note:   BP Readings from Last 3 Encounters:  08/19/21 126/80  08/12/21 128/82  07/03/21 126/79    Current Barriers:  Knowledge Deficits related to basic understanding of hypertension pathophysiology and self care management  Nurse Case Manager Clinical Goal(s):  Over the next 30 days, patient will demonstrate improved adherence to prescribed treatment plan for hypertension as evidenced by taking all medications as prescribed, monitoring and recording blood pressure as directed, adhering to low sodium/DASH diet  Interventions:  Evaluation of current treatment plan related to hypertension self management and patient's adherence to plan as established by provider. Provided education to patient re: stroke prevention, s/s of heart attack and stroke, DASH diet, complications of uncontrolled blood pressure Reviewed medications with patient and discussed importance  of compliance Discussed plans with patient for ongoing care management follow up and provided patient with direct contact information for care management team Advised patient, providing education and rationale, to monitor blood pressure daily and record, calling PCP for findings outside established parameters.  healthy diet promoted reduction of dietary sodium encouraged medication adherence assessment completed- support and encouragement provided blood pressure trends reviewed-   home or ambulatory blood pressure monitoring encouraged  08/19/21: Speaking with Hailey Barnes, She is doing fair. She has been having problems with a possible UTI, which the doctor had given an antibiotic  and finished previously before.     She had symptoms again and gave a urine sample today to see if it was a UTI. The office will call Hailey Barnes with the results of her test. They also advised making an appointment with her PCP to discuss her Jardiance, which may be causing her symptoms. Her Bp today was good, 126/80, and her blood sugars have ranged from 95 to 105. I advise her to keep her same regime.  Patient Goals/Self Care Activities:  Self administers medications as prescribed Attends all scheduled provider appointments Calls provider office for new concerns, questions, or BP outside discussed parameters Checks BP and records as discussed Follows a low sodium diet/DASH diet       Lazaro Arms RN, BSN, [facility] Care Management Coordinator [place] Phone: [phone] I Fax: [phone]

## 2021-08-19 NOTE — Patient Instructions (Addendum)
It was great seeing you today!  You came in for UTI symptoms even after completing your recent course of antibiotics. We are checking your urine for infection, and also checking for STIs and BV. I will call you with any abnormal results and send an antibiotic at that time just to make sure we send in the right one.   You also received your flu vaccine today.   Please check-out at the front desk before leaving the clinic. Schedule to see your PCP in the next couple of weeks to discuss possibly coming off of the Jardiance due to your recurrent UTIs, but if you need to be seen earlier than that for any new issues we're happy to fit you in, just give [place] a call!  Feel free to call with any questions or concerns at any time, at [phone].   Take care,  Dr. Shary [facility]

## 2021-08-20 ENCOUNTER — Telehealth: Payer: Self-pay | Admitting: Internal Medicine

## 2021-08-20 LAB — CERVICOVAGINAL ANCILLARY ONLY
Bacterial Vaginitis (gardnerella): NEGATIVE
Candida Glabrata: NEGATIVE
Candida Vaginitis: NEGATIVE
Chlamydia: NEGATIVE
Comment: NEGATIVE
Comment: NEGATIVE
Comment: NEGATIVE
Comment: NEGATIVE
Comment: NEGATIVE
Comment: NORMAL
Neisseria Gonorrhea: NEGATIVE
Trichomonas: POSITIVE — AB

## 2021-08-20 NOTE — Telephone Encounter (Signed)
Patient asking to cancel upcoming procedure. Stating she has not had any problems and wishes to hold off for now. Educated patient. Canceled procedure. Will verify when Dr. Rayann Heman would like the follow up.

## 2021-08-20 NOTE — Telephone Encounter (Signed)
Patient would like to cancel her ablation 08/29/21

## 2021-08-21 ENCOUNTER — Other Ambulatory Visit: Payer: Self-pay

## 2021-08-21 ENCOUNTER — Telehealth: Payer: Self-pay | Admitting: Family Medicine

## 2021-08-21 ENCOUNTER — Encounter: Payer: Self-pay | Admitting: Family Medicine

## 2021-08-21 ENCOUNTER — Other Ambulatory Visit: Payer: Self-pay | Admitting: Family Medicine

## 2021-08-21 ENCOUNTER — Telehealth: Payer: Self-pay | Admitting: *Deleted

## 2021-08-21 DIAGNOSIS — M5416 Radiculopathy, lumbar region: Secondary | ICD-10-CM

## 2021-08-21 MED ORDER — METRONIDAZOLE 500 MG PO TABS: 500.0000 mg | ORAL_TABLET | Freq: Two times a day (BID) | ORAL | 0 refills | Status: AC

## 2021-08-21 NOTE — Telephone Encounter (Signed)
Patient called asking if Dr [name] would order another epidural for her?  Please advise.

## 2021-08-21 NOTE — Telephone Encounter (Signed)
Pt saw her results on mychart and said they were positive for trich. Wants medication sent in ASAP. Please advise. Regis Wiland Kennon Holter, CMA

## 2021-08-22 ENCOUNTER — Telehealth: Payer: Self-pay | Admitting: Pharmacist

## 2021-08-22 ENCOUNTER — Telehealth: Payer: Self-pay | Admitting: Family Medicine

## 2021-08-22 NOTE — Telephone Encounter (Signed)
Called patient yesterday to discuss results. Sent in Flagyl 57m BID x7 days

## 2021-08-22 NOTE — Telephone Encounter (Signed)
I agree with Dr Graylin Shiver assessment and plan.

## 2021-08-22 NOTE — Telephone Encounter (Signed)
-----   Message from Leavy Cella, [place] sent at 07/26/2021 11:16 AM EDT ----- Regarding: Tobacco Cessaton / Reduction.

## 2021-08-22 NOTE — Telephone Encounter (Signed)
Patient contacted for follow/up of tobacco intake reduction / tobacco cessation attempt.   Since last contact patient reports smoking "slightly more than 1/2 per day".    Medications currently being used;  intermittently using patches and lozenges but is worried about smoking on patch so she has been removing patch if she smokes. We discussed need to minimize intake with smoking while on cigarettes but also discussed that smoking while using patch is acceptable if use is limited to 2-3 cigarettes per day.  Also encouraged patient to use lozenges more consistently AND trying to exchange lozenges for cigarette when possible.    Patient denies any significant side effects from tobacco cessation therapy.   Rates IMPORTANCE of quitting tobacco as moderately high.  Stated she had adequate supply of patches and lozenges.    Patient also has recently been diagnosed with a UTI and started antibiotic therapy yesterday 11/2 (following visit on 10/31).   We discussed discontinuation of Jardiance (empagliflozin) while symptomatic with her UTI.  Agreed to hold until 7 days symptom free then restart her Jardiance.   Total time with patient call and documentation of interaction: 13 minutes.  F/U Phone call planned: 2 weeks. Goal is to smoke only a rare few 2-3 cigarettes per day and use patch daily with lozenges PRN.

## 2021-08-23 ENCOUNTER — Ambulatory Visit
Admission: RE | Admit: 2021-08-23 | Discharge: 2021-08-23 | Disposition: A | Discharge status: Home / Self Care | Payer: Medicare Other | Source: Ambulatory Visit | Attending: Family Medicine | Admitting: Family Medicine

## 2021-08-23 DIAGNOSIS — M5416 Radiculopathy, lumbar region: Secondary | ICD-10-CM

## 2021-08-23 DIAGNOSIS — M545 Low back pain, unspecified: Secondary | ICD-10-CM | POA: Diagnosis not present

## 2021-08-23 LAB — URINE CULTURE: Organism ID, Bacteria: NO GROWTH

## 2021-08-23 MED ORDER — IOPAMIDOL (ISOVUE-M 200) INJECTION 41%
1.0000 mL | Freq: Once | INTRAMUSCULAR | Status: AC
  Administered 2021-08-23: 1 mL via EPIDURAL

## 2021-08-23 MED ORDER — METHYLPREDNISOLONE ACETATE 40 MG/ML INJ SUSP (RADIOLOG
80.0000 mg | Freq: Once | INTRAMUSCULAR | Status: AC
  Administered 2021-08-23: 80 mg via EPIDURAL

## 2021-08-23 NOTE — Discharge Instructions (Signed)

## 2021-08-24 NOTE — Telephone Encounter (Signed)
Cancel appointment.  Reschedule with Dr Lovena Le after the new year.

## 2021-08-26 NOTE — Telephone Encounter (Signed)
Notified patient of schedule and doctor change.  Verbalized agreement and understanding

## 2021-08-29 ENCOUNTER — Other Ambulatory Visit (HOSPITAL_COMMUNITY)
Admission: RE | Admit: 2021-08-29 | Discharge: 2021-08-29 | Disposition: A | Discharge status: Home / Self Care | Payer: Medicare Other | Source: Ambulatory Visit | Attending: Family Medicine | Admitting: Family Medicine

## 2021-08-29 ENCOUNTER — Ambulatory Visit (INDEPENDENT_AMBULATORY_CARE_PROVIDER_SITE_OTHER): Payer: Medicare Other | Admitting: Family Medicine

## 2021-08-29 ENCOUNTER — Ambulatory Visit (HOSPITAL_COMMUNITY): Admit: 2021-08-29 | Payer: Medicare Other | Admitting: Internal Medicine

## 2021-08-29 ENCOUNTER — Encounter (HOSPITAL_COMMUNITY): Payer: Medicare Other

## 2021-08-29 ENCOUNTER — Other Ambulatory Visit: Payer: Self-pay

## 2021-08-29 ENCOUNTER — Other Ambulatory Visit: Payer: Medicare Other

## 2021-08-29 VITALS — BP 114/68 | HR 84 | Ht 62.0 in | Wt 274.5 lb

## 2021-08-29 DIAGNOSIS — Z124 Encounter for screening for malignant neoplasm of cervix: Secondary | ICD-10-CM | POA: Insufficient documentation

## 2021-08-29 DIAGNOSIS — Z122 Encounter for screening for malignant neoplasm of respiratory organs: Secondary | ICD-10-CM

## 2021-08-29 DIAGNOSIS — N949 Unspecified condition associated with female genital organs and menstrual cycle: Secondary | ICD-10-CM

## 2021-08-29 DIAGNOSIS — E119 Type 2 diabetes mellitus without complications: Secondary | ICD-10-CM

## 2021-08-29 DIAGNOSIS — Z1151 Encounter for screening for human papillomavirus (HPV): Secondary | ICD-10-CM | POA: Insufficient documentation

## 2021-08-29 DIAGNOSIS — N898 Other specified noninflammatory disorders of vagina: Secondary | ICD-10-CM | POA: Diagnosis not present

## 2021-08-29 DIAGNOSIS — R300 Dysuria: Secondary | ICD-10-CM | POA: Diagnosis not present

## 2021-08-29 DIAGNOSIS — Z01411 Encounter for gynecological examination (general) (routine) with abnormal findings: Secondary | ICD-10-CM | POA: Diagnosis present

## 2021-08-29 DIAGNOSIS — Z1231 Encounter for screening mammogram for malignant neoplasm of breast: Secondary | ICD-10-CM | POA: Diagnosis not present

## 2021-08-29 LAB — POCT URINALYSIS DIP (MANUAL ENTRY)
Bilirubin, UA: NEGATIVE
Blood, UA: NEGATIVE
Glucose, UA: >=1000 mg/dL — AB
Ketones, POC UA: NEGATIVE mg/dL
Leukocytes, UA: NEGATIVE
Nitrite, UA: NEGATIVE
Protein Ur, POC: NEGATIVE mg/dL
Spec Grav, UA: 1.02 (ref 1.010–1.025)
Urobilinogen, UA: 0.2 E.U./dL
pH, UA: 5.5 (ref 5.0–8.0)

## 2021-08-29 LAB — POCT GLYCOSYLATED HEMOGLOBIN (HGB A1C): HbA1c, POC (controlled diabetic range): 6 % (ref 0.0–7.0)

## 2021-08-29 LAB — POCT WET PREP (WET MOUNT)
Clue Cells Wet Prep Whiff POC: NEGATIVE
Trichomonas Wet Prep HPF POC: ABSENT

## 2021-08-29 SURGERY — SVT ABLATION
Anesthesia: General

## 2021-08-29 MED ORDER — FLUCONAZOLE 150 MG PO TABS: 150.0000 mg | ORAL_TABLET | Freq: Once | ORAL | 0 refills | Status: AC

## 2021-08-29 NOTE — Assessment & Plan Note (Signed)
A1c today of 6.0.  Previously 6.0.  Continues on Jardiance and Victoza.

## 2021-08-29 NOTE — Patient Instructions (Signed)
I have ordered a mammogram as well as a lung cancer screening for you.  We performed your Pap smear today and I will let you know the results when it returns.  We performed a urinalysis and wet prep today which showed no signs of a bacterial or other infection but your symptoms are consistent with yeast infection so would like to treat you for this.  I sent this medicine to your pharmacy..  We also checked for gonorrhea and chlamydia and I will let you know the results in a few days.  If you are still having burning when you pee in 1 week I would like to see you back.  If the pain improves then we have likely fix this issue or was due to trauma.

## 2021-08-29 NOTE — Progress Notes (Addendum)
    SUBJECTIVE:   CHIEF COMPLAINT / HPI:   Encounter for Pap smear: 60 year old female presenting for follow-up after noting cervical mucosal melanosis on cervical biopsy with recommendation to repeat Pap smear between June and December of this year.  Patient without concerns or complaints at this time.  Prediabetes: Last A1c in April of 6.0.Currently on Jardiance and Victoza.  She has not had her diabetic eye exam.  Dysuria/vaginal pain: Patient requesting wet prep and STD testing due to dysuria and vaginal pain.  Also wants her urine checked for urine infection.  States she recently had trichomonas and was treated for this.  She denies any discharge at this time.  Thinks when she urinates but is not completely sure.  PERTINENT  PMH / PSH: T2DM  OBJECTIVE:   BP 114/68   Pulse 84   Ht 5' 2"  (1.575 m)   Wt 274 lb 8 oz (124.5 kg)   LMP 04/06/2017   SpO2 95%   BMI 50.21 kg/m    General: NAD, pleasant, able to participate in exam Respiratory: No respiratory distress Pelvic exam: VULVA: normal appearing vulva with no masses, tenderness or lesions, VAGINA: normal appearing vagina with normal color, no lesions, CERVIX: normal appearing cervix without discharge or lesions.  Some whitish discharge present. Chaperone Sherri Neuro: alert, no obvious focal deficits Psych: Normal affect and mood  ASSESSMENT/PLAN:   T2DM (type 2 diabetes mellitus) (HCC) A1c today of 6.0.  Previously 6.0.  Continues on Jardiance and Victoza.   Vaginal discomfort/dysuria: 60 year old female presenting with complaint of either vaginal discomfort or dysuria.  She would like her urine checked and would also like to be checked for yeast/BV/trichomoniasis/GC/chlamydia.  She denies any significant discharge but on physical exam she does have some whitish discharge suggestive of yeast infection.  Wet prep performed which was negative..  Swabs collected for GC/chlamydia.  Urinalysis was negative for signs of  infection.  On physical exam she has signs consistent with yeast infections antibiotics.  She also had a negative urine culture about 10 days ago.  Will treat with Diflucan.    Follow-up in 1 week if symptoms do not improve.  Pap smear performed.  Lung cancer screening: Patient with 30-pack-year smoking history.  Had a screening low-dose CT last year.  We will repeat.  Lurline Del, [place]

## 2021-08-30 ENCOUNTER — Telehealth: Payer: Self-pay

## 2021-08-30 LAB — CERVICOVAGINAL ANCILLARY ONLY
Chlamydia: NEGATIVE
Comment: NEGATIVE
Comment: NORMAL
Neisseria Gonorrhea: NEGATIVE

## 2021-08-30 NOTE — Telephone Encounter (Signed)
Called and sent Hailey Barnes with info for CT scheduled at [facility] on 11/22 with an arrival time of 12:15. Ottis Stain, CMA

## 2021-09-02 LAB — CYTOLOGY - PAP
Comment: NEGATIVE
Diagnosis: NEGATIVE
High risk HPV: NEGATIVE

## 2021-09-08 ENCOUNTER — Other Ambulatory Visit: Payer: Self-pay | Admitting: Family Medicine

## 2021-09-10 ENCOUNTER — Telehealth: Payer: Self-pay | Admitting: Pharmacist

## 2021-09-10 ENCOUNTER — Ambulatory Visit (HOSPITAL_COMMUNITY)
Admission: RE | Admit: 2021-09-10 | Discharge: 2021-09-10 | Disposition: A | Discharge status: Home / Self Care | Payer: Medicare Other | Source: Ambulatory Visit | Attending: Family Medicine | Admitting: Family Medicine

## 2021-09-10 ENCOUNTER — Other Ambulatory Visit: Payer: Self-pay

## 2021-09-10 DIAGNOSIS — F17210 Nicotine dependence, cigarettes, uncomplicated: Secondary | ICD-10-CM | POA: Diagnosis not present

## 2021-09-10 DIAGNOSIS — Z122 Encounter for screening for malignant neoplasm of respiratory organs: Secondary | ICD-10-CM | POA: Diagnosis not present

## 2021-09-10 DIAGNOSIS — Z87891 Personal history of nicotine dependence: Secondary | ICD-10-CM | POA: Diagnosis not present

## 2021-09-10 NOTE — Telephone Encounter (Signed)
-----   Message from Leavy Cella, [place] sent at 08/22/2021  2:41 PM EDT ----- Regarding: Tobacco Cessation smoking 3 or less per day and UTI follow-up REstart Jardiance?

## 2021-09-10 NOTE — Telephone Encounter (Signed)
Noted and agree. 

## 2021-09-10 NOTE — Telephone Encounter (Signed)
Patient contacted for follow/up of tobacco intake reduction / cessation attempt.   Since last contact patient reports smoking "about the same amount and admits to not using/waring her nicotine patches.      Rates IMPORTANCE of quitting tobacco as very high.  She shared that she was going to [facility] later today to have her lung cancer screening scan performed.  She shared she was anxious about the test. We discussed several details of the procedure and concluded that finding a cancer earlier was likely to have a better prognosis.   Rates CONFIDENCE of quitting tobacco remains high (states - "don't give up on me, I am going to do this".  Motivation to quit: health  Plan to restart patches, and paper to 5 or less by mid-December.  Plan to quit by the end of the yea.r   Total time with patient call and documentation of interaction: 14 minutes.  F/U Phone call planned: 3 weeks.

## 2021-09-12 ENCOUNTER — Other Ambulatory Visit: Payer: Self-pay | Admitting: Family Medicine

## 2021-09-12 DIAGNOSIS — I10 Essential (primary) hypertension: Secondary | ICD-10-CM

## 2021-09-12 DIAGNOSIS — I5032 Chronic diastolic (congestive) heart failure: Secondary | ICD-10-CM

## 2021-09-14 ENCOUNTER — Other Ambulatory Visit: Payer: Self-pay | Admitting: Family Medicine

## 2021-09-14 DIAGNOSIS — J449 Chronic obstructive pulmonary disease, unspecified: Secondary | ICD-10-CM

## 2021-09-16 ENCOUNTER — Encounter (HOSPITAL_COMMUNITY): Payer: Self-pay | Admitting: *Deleted

## 2021-09-16 ENCOUNTER — Ambulatory Visit (HOSPITAL_COMMUNITY)
Admission: EM | Admit: 2021-09-16 | Discharge: 2021-09-16 | Disposition: A | Discharge status: Home / Self Care | Payer: Medicare Other | Attending: Student | Admitting: Student

## 2021-09-16 ENCOUNTER — Other Ambulatory Visit: Payer: Self-pay

## 2021-09-16 DIAGNOSIS — J441 Chronic obstructive pulmonary disease with (acute) exacerbation: Secondary | ICD-10-CM | POA: Insufficient documentation

## 2021-09-16 DIAGNOSIS — N760 Acute vaginitis: Secondary | ICD-10-CM | POA: Insufficient documentation

## 2021-09-16 DIAGNOSIS — Z113 Encounter for screening for infections with a predominantly sexual mode of transmission: Secondary | ICD-10-CM | POA: Diagnosis present

## 2021-09-16 MED ORDER — AMOXICILLIN-POT CLAVULANATE 875-125 MG PO TABS: 1.0000 | ORAL_TABLET | Freq: Two times a day (BID) | ORAL | 0 refills | Status: DC

## 2021-09-16 NOTE — ED Triage Notes (Signed)
Pt reports vag itching started 1 1/2 weeks ago. Pt also has a cough with wheezing .

## 2021-09-16 NOTE — Discharge Instructions (Addendum)
-  Start the antibiotic-Augmentin (amoxicillin-clavulanate), 1 pill every 12 hours for 7 days.  You can take this with food like with breakfast and dinner. -For your yeast infection, start the Diflucan (fluconazole) that you have at home- Take one pill today (day 1). If you're still having symptoms in 3 days, take the second pill.  -Follow-up with PCP if symptoms worsen/persist.

## 2021-09-16 NOTE — ED Provider Notes (Signed)
[facility]    CSN: [id number] Arrival date & time: 09/16/21  0174      History   Chief Complaint Chief Complaint  Patient presents with   Vaginal Itching   Cough   Wheezing    HPI Hailey Barnes is a 60 y.o. female presenting with cough for 1 month and vaginal itching x10 days. Medical history COPD, diabetes.  States that she has had a cough productive of yellow sputum with shortness of breath and wheezing for about 1 month, CT scan was performed 6 days ago and patient states there was a nodule.  No treatment has been sent.  She does have some right-sided rib pain from the coughing.  This also hurts with movement and deep inspiration.  Also with vaginal itching for 10 days, denies discharge.  Does endorse new partner.  Chronic yeast infections given diabetes/Jardiance.  Requesting STI screen.  HPI  Past Medical History:  Diagnosis Date   Arthritis    "knees" (02/21/2014), back   CHF (congestive heart failure) ([place])    Chronic bronchitis ([place])    "get it q yr" (02/21/2014)   Chronic diastolic CHF (congestive heart failure) ([place])    a. Echo 10/23/2016: EF 75 %   Chronic lower back pain    Complication of anesthesia    "I don't come out well; I chew on my tongue"   COPD (chronic obstructive pulmonary disease) ([place])    Diabetes mellitus without complication ([place])    TYPE 2   Family history of adverse reaction to anesthesia    BROTHER PONV, had a blood clot several days later and heart stopped   Fatty liver    NONALCOLIC   Fibroid    Heart murmur    Hypertension    Infection of right eye    current dx on Saturday 7/15 - using drops   Leg swelling    bilateral   Migraine 1982-2009   MVP (mitral valve prolapse)    Neuromuscular disorder (HCC)    right leg nerve pain   Shortness of breath    WITH EXERTION   Sinus pause    a. noted on telemetry during admission from 10/2016, lasting up to 4.4 seconds. BB discontinued.    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   D/Dx: carpal tunnel, ulnar neuropathy Less likely: cervical radiculopathy, vasculitis     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   Lesion of cervix 07/05/2020   COPD (chronic obstructive pulmonary disease) ([place]) 94/49/6759   P[place] of knee, left 16/38/4665   Chondromalacia, left knee 08/16/2018   Primary osteoarthritis of both knees 03/24/2018   Bilateral primary osteoarthritis of first carpometacarpal joints 11/16/2017   OSA (obstructive sleep apnea) 10/30/2016   Left lumbar radiculopathy 06/02/2016   Hyperlipidemia 02/07/2016   T2DM (type 2 diabetes mellitus) ([place]) 12/07/2015   Depression 04/19/2015   Diminished vision 06/23/2014   Chronic diastolic CHF (congestive heart failure) ([place]) 06/13/2014   Restrictive lung disease 05/10/2014   Vaginal discharge 03/22/2014   NASH (nonalcoholic steatohepatitis) 01/04/2014   Hypertension 05/26/2010   Morbid obesity ([place]) 12/17/2006   Tobacco use disorder  12/17/2006    Past Surgical History:  Procedure Laterality Date   CESAREAN SECTION  1982   CHOLECYSTECTOMY N/A 06/17/2017   Procedure: LAPAROSCOPIC CHOLECYSTECTOMY WITH INTRAOPERATIVE CHOLANGIOGRAM;  Surgeon: Donnie Mesa, MD;  Location: [facility];  Service: General;  Laterality: N/A;   COLONOSCOPY WITH PROPOFOL N/A 04/28/2017   Procedure: COLONOSCOPY WITH PROPOFOL;  Surgeon: Manus Gunning, MD;  Location: [facility];  Service: Gastroenterology;  Laterality: N/A;   DILATATION & CURETTAGE/HYSTEROSCOPY WITH MYOSURE N/A 05/06/2016   Procedure: DILATATION & CURETTAGE/HYSTEROSCOPY WITH MYOSURE;  Surgeon: Terrance Mass, MD;  Location: [facility];  Service: Gynecology;   Laterality: N/A;  request to follow around 8:45  requests one hour OR time   [place] N/A 04/03/2017   Procedure: [place];  Surgeon: Terrance Mass, MD;  Location: [facility];  Service: Gynecology;  Laterality: N/A;   DILATION AND CURETTAGE OF UTERUS     ESOPHAGOGASTRODUODENOSCOPY (EGD) WITH PROPOFOL N/A 04/28/2017   Procedure: ESOPHAGOGASTRODUODENOSCOPY (EGD) WITH PROPOFOL;  Surgeon: Manus Gunning, MD;  Location: [facility];  Service: Gastroenterology;  Laterality: N/A;   FOOT SURGERY Bilateral    "took bones out; put pins in"   IR ABLATE LIVER CRYOABLATION  02/08/2020   IR RADIOLOGIST EVAL & MGMT  02/03/2020   KNEE ARTHROSCOPY Left 08/16/2018   Procedure: ARTHROSCOPY KNEE PARTIAL MEDIAL MENISECTOMY, CHONDROPLASTY MEDIAL AND LATERAL, PLICA RELEASE MEDIAL;  Surgeon: Dorna Leitz, MD;  Location: [facility];  Service: Orthopedics;  Laterality: Left;   LEFT AND RIGHT HEART CATHETERIZATION WITH CORONARY ANGIOGRAM N/A 03/31/2014   Procedure: LEFT AND RIGHT HEART CATHETERIZATION WITH CORONARY ANGIOGRAM;  Surgeon: Birdie Riddle, MD;  Location: [facility];  Service: Cardiovascular;  Laterality: N/A;   MYOMECTOMY  YRS AGO   sonohystogram  04/14/2016   [facility]: intramural fibroid, premenopausal endometrium, right fluid filled tubular structure    OB History     Gravida  1   Para  1   Term      Preterm      AB      Living  1      SAB      IAB      Ectopic      Multiple      Live Births               Home Medications    Prior to Admission medications   Medication Sig Start Date End Date Taking? Authorizing Provider  amoxicillin-clavulanate (AUGMENTIN) 875-125 MG tablet Take 1 tablet by mouth every 12 (twelve) hours. 09/16/21  Yes Hazel Sams, PA-C  acetaminophen (TYLENOL) 500 MG tablet Take 1,000 mg by mouth every 6 (six) hours as needed for moderate pain or  headache.    [provider]  albuterol (VENTOLIN HFA) 108 (90 Base) MCG/ACT inhaler INHALE 1-2 PUFFS BY MOUTH EVERY 6 HOURS AS NEEDED FOR WHEEZE OR SHORTNESS OF BREATH 08/07/21   Vanessa [place], Ryan, DO  allopurinol (ZYLOPRIM) 300 MG tablet TAKE 1 TABLET BY MOUTH EVERY DAY Patient taking differently: Take 300 mg by mouth daily. 04/10/21   Silverio Decamp, MD  amLODipine (NORVASC) 2.5 MG tablet Take 1 tablet (2.5 mg total) by mouth at bedtime. 04/18/21   Zenia Resides, MD  aspirin EC 81 MG tablet Take 81 mg by mouth daily.    [provider]  atorvastatin (LIPITOR) 40 MG tablet TAKE 1 TABLET BY MOUTH ONCE DAILY 05/27/21   Lurline Del,  DO  Cholecalciferol (VITAMIN D) 50 MCG (2000 UT) tablet Take 4,000 Units by mouth daily.    [provider]  Fluticasone-Umeclidin-Vilant (TRELEGY ELLIPTA) 200-62.5-25 MCG/INH AEPB Inhale 1 puff into the lungs daily. 04/18/21   Zenia Resides, MD  glucose blood (ACCU-CHEK AVIVA) test strip Use to check sugar up to 3 times daily 04/14/19   Zenia Resides, MD  hydrALAZINE (APRESOLINE) 25 MG tablet Take 2 tablets (50 mg total) by mouth 3 (three) times daily. 04/18/21   Zenia Resides, MD  JARDIANCE 25 MG TABS tablet TAKE 1 TABLET BY MOUTH EVERY DAY 07/09/21   Lurline Del, DO  Lancets (ACCU-CHEK SOFT TOUCH) lancets Use as instructed 04/05/19   Myles Gip, DO  liraglutide (VICTOZA) 18 MG/3ML SOPN Inject 0.6 mg into the skin daily. Inject 9.4WN minus 2 clicks under the skin daily. Patient taking differently: Inject 0.6 mg into the skin daily. 03/28/21   Zenia Resides, MD  losartan (COZAAR) 100 MG tablet Take 1 tablet (100 mg total) by mouth daily. Patient taking differently: Take 100 mg by mouth at bedtime. 03/15/21   Zenia Resides, MD  Melaton-Thean-Cham-PassF-LBalm (MELATONIN + L-THEANINE) CAPS Take 2 capsules by mouth at bedtime as needed (sleep).    [provider]  meloxicam (MOBIC) 15 MG tablet TAKE 1 TABLET (15  MG TOTAL) BY MOUTH DAILY. Patient taking differently: Take 15 mg by mouth daily as needed for pain. 04/10/21   Edrick Kins, DPM  Multiple Vitamins-Minerals (MULTIVITAMIN GUMMIES ADULT PO) Take 2 capsules by mouth daily.    [provider]  nicotine (NICODERM CQ - DOSED IN MG/24 HOURS) 21 mg/24hr patch Place 1 patch (21 mg total) onto the skin daily. Patient not taking: Reported on 09/10/2021 03/28/21   Zenia Resides, MD  nicotine polacrilex (COMMIT) 2 MG lozenge Take 1 lozenge (2 mg total) by mouth as needed for smoking cessation. 03/28/21   Zenia Resides, MD  phenazopyridine (PYRIDIUM) 200 MG tablet Take 200 mg by mouth 3 (three) times daily as needed for pain. Patient not taking: Reported on 07/08/2021 07/01/21   [provider]  pindolol (VISKEN) 10 MG tablet Take 2 tablets (20 mg total) by mouth 2 (two) times daily. 12/18/20   [name], MD  Polyvinyl Alcohol-Povidone (CLEAR EYES ALL SEASONS OP) Place 1 drop into both eyes daily as needed (allergies/itchy eyes).    [provider]  Probiotic Product (PROBIOTIC PO) Take 2 capsules by mouth daily.    [provider]  spironolactone (ALDACTONE) 100 MG tablet Take 1 tablet (100 mg total) by mouth at bedtime. 03/28/21   Zenia Resides, MD  sulfamethoxazole-trimethoprim (BACTRIM DS) 800-160 MG tablet Take 1 tablet by mouth 2 (two) times daily. 07/03/21   [provider]  torsemide (DEMADEX) 20 MG tablet TAKE 1 TABLET BY MOUTH EVERY DAY MAY TAKE 1 EXTRA TABLET DAILY ONLY AS NEEDED Patient taking differently: Take 20 mg by mouth every other day. 06/19/21   Vanessa [place], Ryan, DO  triamcinolone (KENALOG) 0.025 % ointment APPLY 1 APPLICATION TOPICALLY 2 (TWO) TIMES DAILY AS NEEDED. 09/09/21   Lurline Del, DO    Family History Family History  Problem Relation Age of Onset   Hypertension Mother    COPD Mother    Hypertension Father    Cancer Sister        UTERINE???   COPD Sister    Hypertension  Sister    Hypertension Sister     Social  History Social History   Tobacco Use   Smoking status: Every Day    Packs/day: 0.50    Years: 42.00    Pack years: 21.00    Types: Cigarettes    Start date: 10/20/1976   Smokeless tobacco: Never   Tobacco comments:    Previous 1 ppd smoker x > 40 years.  Now smokes 10 cigarettes per day  Vaping Use   Vaping Use: Never used  Substance Use Topics   Alcohol use: Yes    Comment: very occasional   Drug use: Yes    Types: "Crack" cocaine, Marijuana    Comment: 03/30/2014 "stopped using crack 02/21/2014"     Allergies   Bupropion and Norvasc [amlodipine]   Review of Systems Review of Systems  Constitutional:  Negative for chills and fever.  HENT:  Negative for sore throat.   Eyes:  Negative for pain and redness.  Respiratory:  Positive for cough and wheezing. Negative for shortness of breath.   Cardiovascular:  Negative for chest pain.  Gastrointestinal:  Negative for abdominal pain, diarrhea, nausea and vomiting.  Genitourinary:  Negative for decreased urine volume, difficulty urinating, dysuria, flank pain, frequency, genital sores, hematuria, urgency, vaginal bleeding, vaginal discharge and vaginal pain.  Musculoskeletal:  Negative for back pain.  Skin:  Negative for rash.  All other systems reviewed and are negative.   Physical Exam Triage Vital Signs ED Triage Vitals [09/16/21 1114]  Enc Vitals Group     BP      Pulse      Resp      Temp      Temp src      SpO2      Weight      Height      Head Circumference      Peak Flow      Pain Score 7     Pain Loc      Pain Edu?      Excl. in [place]?    No data found.  Updated Vital Signs BP (!) 152/95   Pulse 73   Temp 98.3 F (36.8 C)   Resp 18   LMP 04/06/2017   SpO2 98%   Visual Acuity Right Eye Distance:   Left Eye Distance:   Bilateral Distance:    Right Eye Near:   Left Eye Near:    Bilateral Near:     Physical Exam Vitals reviewed.  Constitutional:       General: She is not in acute distress.    Appearance: Normal appearance. She is not ill-appearing.  HENT:     Head: Normocephalic and atraumatic.     Right Ear: Tympanic membrane, ear canal and external ear normal. No tenderness. No middle ear effusion. There is no impacted cerumen. Tympanic membrane is not perforated, erythematous, retracted or bulging.     Left Ear: Tympanic membrane, ear canal and external ear normal. No tenderness.  No middle ear effusion. There is no impacted cerumen. Tympanic membrane is not perforated, erythematous, retracted or bulging.     Nose: Nose normal. No congestion.     Mouth/Throat:     Mouth: Mucous membranes are moist.     Pharynx: Uvula midline. No oropharyngeal exudate or posterior oropharyngeal erythema.  Eyes:     Extraocular Movements: Extraocular movements intact.     Pupils: Pupils are equal, round, and reactive to light.  Cardiovascular:     Rate and Rhythm: Normal rate and regular rhythm.     Heart  sounds: Normal heart sounds.  Pulmonary:     Effort: Pulmonary effort is normal.     Breath sounds: Decreased breath sounds present. No wheezing, rhonchi or rales.     Comments: Decreased breath sounds throughout  Abdominal:     Palpations: Abdomen is soft.     Tenderness: There is no abdominal tenderness. There is no guarding or rebound.  Genitourinary:    Comments: deferred Lymphadenopathy:     Cervical: No cervical adenopathy.     Right cervical: No superficial cervical adenopathy.    Left cervical: No superficial cervical adenopathy.  Neurological:     General: No focal deficit present.     Mental Status: She is alert and oriented to person, place, and time.  Psychiatric:        Mood and Affect: Mood normal.        Behavior: Behavior normal.        Thought Content: Thought content normal.        Judgment: Judgment normal.     UC Treatments / Results  Labs (all labs ordered are listed, but only abnormal results are displayed) Labs  Reviewed  CERVICOVAGINAL ANCILLARY ONLY    EKG   Radiology No results found.  Procedures Procedures (including critical care time)  Medications Ordered in UC Medications - No data to display  Initial Impression / Assessment and Plan / UC Course  I have reviewed the triage vital signs and the nursing notes.  Pertinent labs & imaging results that were available during my care of the patient were reviewed by me and considered in my medical decision making (see chart for details).     This patient is a very pleasant 60 y.o. year old female presenting with COPD exacerbation and suspected vaginal candidiasis. Afebrile, nontachycardic, no reproducible abd pain or CVAT.  CT 09/10/21: New small ground-glass nodule of the right lower lobe measuring 7.4 mm located on image 181, likely infectious or inflammatory.  Symptoms are unchanged since this study (per pt).  She has not been prescribed any medications on the basis of the CT; will start Augmentin today given concern for infection.  This patient does take Jardiance for diabetes, recurrent vaginal candida due to this.  Last cervicovaginal swab performed at PCP 11/10 and was negative.  New partner. Will send self-swab for G/C, trich, yeast, BV testing. Declines HIV, RPR. Safe sex precautions.   Augmentin sent as below to cover for pneumonia. She will take diflucan she has at home already.   ED return precautions discussed. Patient verbalizes understanding and agreement.   Level 4 for acute exacerbation of chronic condition and prescription drug management.   Final Clinical Impressions(s) / UC Diagnoses   Final diagnoses:  COPD exacerbation ([place])  Vaginitis and vulvovaginitis  Routine screening for STI (sexually transmitted infection)     Discharge Instructions      -Start the antibiotic-Augmentin (amoxicillin-clavulanate), 1 pill every 12 hours for 7 days.  You can take this with food like with breakfast and dinner. -For  your yeast infection, start the Diflucan (fluconazole) that you have at home- Take one pill today (day 1). If you're still having symptoms in 3 days, take the second pill.  -Follow-up with PCP if symptoms worsen/persist.      ED Prescriptions     Medication Sig Dispense Auth. Provider   amoxicillin-clavulanate (AUGMENTIN) 875-125 MG tablet Take 1 tablet by mouth every 12 (twelve) hours. 14 tablet [place] Reader      PDMP not  reviewed this encounter.   Hazel Sams, PA-C 09/16/21 1142

## 2021-09-17 LAB — CERVICOVAGINAL ANCILLARY ONLY
Bacterial Vaginitis (gardnerella): NEGATIVE
Candida Glabrata: NEGATIVE
Candida Vaginitis: NEGATIVE
Chlamydia: NEGATIVE
Comment: NEGATIVE
Comment: NEGATIVE
Comment: NEGATIVE
Comment: NEGATIVE
Comment: NEGATIVE
Comment: NORMAL
Neisseria Gonorrhea: NEGATIVE
Trichomonas: NEGATIVE

## 2021-09-18 ENCOUNTER — Other Ambulatory Visit: Payer: Self-pay | Admitting: Family Medicine

## 2021-09-18 ENCOUNTER — Ambulatory Visit: Payer: Medicare Other | Admitting: Family Medicine

## 2021-09-27 ENCOUNTER — Ambulatory Visit: Payer: Medicare Other | Admitting: Internal Medicine

## 2021-09-30 ENCOUNTER — Ambulatory Visit: Payer: Medicare Other

## 2021-09-30 NOTE — Chronic Care Management (AMB) (Signed)
   RN Case Manager Care Management   Phone Outreach    09/30/2021 Name: Hailey Barnes MRN: [id number] DOB: June 27, 1961  Hailey Barnes is a 60 y.o. year old female who is a primary care patient of Lurline Del, DO .   Unable to keep phone appointment today and requested to reschedule.  Follow Up Plan: Appointment was rescheduled with CCM RN on 10/01/21 at 10 am    Review of patient status, including review of consultants reports, relevant laboratory and other test results, and collaboration with appropriate care team members and the patient's provider was performed as part of comprehensive patient evaluation and provision of care management services.    Lazaro Arms RN, BSN, [facility] Care Management Coordinator [place] Phone: [phone] Fax: [phone]

## 2021-10-01 ENCOUNTER — Telehealth: Payer: Self-pay | Admitting: Pharmacist

## 2021-10-01 ENCOUNTER — Telehealth: Payer: Medicare Other

## 2021-10-01 NOTE — Telephone Encounter (Signed)
-----   Message from Leavy Cella, [place] sent at 09/10/2021 11:39 AM EST ----- Regarding: Tobacco Cessation / Quit Date prior to the end of the year?

## 2021-10-01 NOTE — Telephone Encounter (Signed)
Attempted to contact patient for follow-up of tobacco intake reduction / cessation.    Left HIPAA compliant voice mail requesting call back to direct phone: [phone]  I will plan to call her again soon.    Total time with patient call and documentation of interaction: 8 minutes.  Additional F/U Phone call planned:

## 2021-10-03 ENCOUNTER — Telehealth: Payer: Self-pay

## 2021-10-03 NOTE — Telephone Encounter (Signed)
° °  RN Case Manager Care Management   Phone Outreach    10/03/2021 Name: Jackquelyn Sundberg MRN: [id number] DOB: Jan 27, 1961  Hailey Barnes is a 60 y.o. year old female who is a primary care patient of Lurline Del, DO .   Telephone outreach was unsuccessful A HIPPA compliant phone message was left for the patient providing contact information and requesting a return call.   Follow Up Plan: Will route chart to Care Guide to see if patient would like to reschedule phone appointment    Review of patient status, including review of consultants reports, relevant laboratory and other test results, and collaboration with appropriate care team members and the patient's provider was performed as part of comprehensive patient evaluation and provision of care management services.    Lazaro Arms RN, BSN, [facility] Care Management Coordinator [place] Phone: [phone] Fax: [phone]

## 2021-10-04 ENCOUNTER — Telehealth: Payer: Self-pay | Admitting: *Deleted

## 2021-10-04 NOTE — Telephone Encounter (Signed)
Attempted to contact patient for follow-up of tobacco intake reduction / cessation.    Left HIPAA compliant voice mail requesting call back to direct phone: [phone]    Total time with patient call and documentation of interaction: 8 minutes.  Additional F/U Phone call planned: 10/2021

## 2021-10-04 NOTE — Chronic Care Management (AMB) (Signed)
°  Care Management   Note  10/04/2021 Name: Hailey Barnes MRN: [id number] DOB: [birth year]  Hailey Barnes is a 60 y.o. year old female who is a primary care patient of Lurline Del, DO and is actively engaged with the care management team. I reached out to [name] by phone today to assist with re-scheduling a follow up visit with the RN Case Manager  Follow up plan: Unsuccessful telephone outreach attempt made. A HIPAA compliant phone message was left for the patient providing contact information and requesting a return call.  The care management team will reach out to the patient again over the next 7-14 days.  If patient returns call to provider office, please advise to call [place] at [phone].  [place] Management  Direct Dial: [phone]

## 2021-10-07 NOTE — Chronic Care Management (AMB) (Signed)
°  Care Management   Note  10/07/2021 Name: Hailey Barnes MRN: [id number] DOB: April 04, 1961  Hailey Barnes is a 60 y.o. year old female who is a primary care patient of Lurline Del, DO and is actively engaged with the care management team. I reached out to [name] by phone today to assist with re-scheduling a follow up visit with the RN Case Manager  Follow up plan: Telephone appointment with care management team member scheduled for:10/11/21  [facility]  Direct Dial: [phone]

## 2021-10-11 ENCOUNTER — Ambulatory Visit: Payer: Medicare Other

## 2021-10-11 ENCOUNTER — Other Ambulatory Visit: Payer: Self-pay | Admitting: Sports Medicine

## 2021-10-11 DIAGNOSIS — E790 Hyperuricemia without signs of inflammatory arthritis and tophaceous disease: Secondary | ICD-10-CM

## 2021-10-11 NOTE — Patient Instructions (Signed)
Visit Information  Ms. Yohannes  it was nice speaking with you. Please call me directly [phone] if you have questions about the goals we discussed.  Patient Goals/Self Care Activities: -Patient/Caregiver will self-administer medications as prescribed as evidenced by self-report/primary caregiver report  -Patient/Caregiver will attend all scheduled provider appointments as evidenced by clinician review of documented attendance to scheduled appointments and patient/caregiver report -Patient/Caregiver will call pharmacy for medication refills as evidenced by patient report and review of pharmacy fill history as appropriate -Patient/Caregiver will call provider office for new concerns or questions as evidenced by review of documented incoming telephone call notes and patient report -Patient/Caregiver verbalizes understanding of plan -Patient/Caregiver will focus on medication adherence by taking medications as prescribed -Calls provider office for new concerns, questions, or BP outside discussed parameters -Checks BP and records as discussed -Follows a low sodium diet/DASH diet     The patient verbalized understanding of instructions, educational materials, and care plan provided today and declined offer to receive copy of patient instructions, educational materials, and care plan.   Follow up Plan: Patient would like continued follow-up.  CCM RNCM will outreach the patient within the next 7 weeks.  Patient will call office if needed prior to next encounter  Lazaro Arms, RN  [phone]

## 2021-10-11 NOTE — Chronic Care Management (AMB) (Signed)
Chronic Care Management   CCM RN Visit Note  10/11/2021 Name: Hailey Barnes MRN: [id number] DOB: May 11, 1961  Subjective: Hailey Barnes is a 60 y.o. year old female who is a primary care patient of Lurline Del, DO. The care management team was consulted for assistance with disease management and care coordination needs.    Engaged with patient by telephone for follow up visit in response to provider referral for case management and/or care coordination services.   Consent to Services:  The patient was given information about Chronic Care Management services, agreed to services, and gave verbal consent prior to initiation of services.  Please see initial visit note for detailed documentation.   Patient agreed to services and verbal consent obtained.    Assessment: Patient is making progress with her Hypertension , but continues to have difficulty with checking and recording her values . See Care Plan below for interventions and patient self-care actives. Follow up Plan: Patient would like continued follow-up.  CCM RNCM will outreach the patient within the next 7 weeks.  Patient will call office if needed prior to next encounter Review of patient past medical history, allergies, medications, health status, including review of consultants reports, laboratory and other test data, was performed as part of comprehensive evaluation and provision of chronic care management services.   SDOH (Social Determinants of Health) assessments and interventions performed:    CCM Care Plan  Allergies  Allergen Reactions   Bupropion Other (See Comments)    Throat soreness and difficulty swallowing.    Norvasc [Amlodipine] Swelling    Significant leg swelling with 32m. Able to tolerate 2.537m     Outpatient Encounter Medications as of 10/11/2021  Medication Sig Note   acetaminophen (TYLENOL) 500 MG tablet Take 1,000 mg by mouth every 6 (six) hours as needed for moderate pain or  headache.    albuterol (VENTOLIN HFA) 108 (90 Base) MCG/ACT inhaler INHALE 1-2 PUFFS BY MOUTH EVERY 6 HOURS AS NEEDED FOR WHEEZE OR SHORTNESS OF BREATH    allopurinol (ZYLOPRIM) 300 MG tablet TAKE 1 TABLET BY MOUTH EVERY DAY (Patient taking differently: Take 300 mg by mouth daily.)    amLODipine (NORVASC) 2.5 MG tablet Take 1 tablet (2.5 mg total) by mouth at bedtime.    amoxicillin-clavulanate (AUGMENTIN) 875-125 MG tablet Take 1 tablet by mouth every 12 (twelve) hours.    aspirin EC 81 MG tablet Take 81 mg by mouth daily.    atorvastatin (LIPITOR) 40 MG tablet TAKE 1 TABLET BY MOUTH ONCE DAILY    BD PEN NEEDLE NANO U/F 32G X 4 MM MISC USE 3 TIMES A DAY    Cholecalciferol (VITAMIN D) 50 MCG (2000 UT) tablet Take 4,000 Units by mouth daily.    glucose blood (ACCU-CHEK AVIVA) test strip Use to check sugar up to 3 times daily    hydrALAZINE (APRESOLINE) 25 MG tablet TAKE 3 TABLETS BY MOUTH 3 TIMES A DAY    JARDIANCE 25 MG TABS tablet TAKE 1 TABLET BY MOUTH EVERY DAY    Lancets (ACCU-CHEK SOFT TOUCH) lancets Use as instructed    liraglutide (VICTOZA) 18 MG/3ML SOPN Inject 0.6 mg into the skin daily. Inject 0.9.6PRinus 2 clicks under the skin daily. (Patient taking differently: Inject 0.6 mg into the skin daily.)    losartan (COZAAR) 100 MG tablet Take 1 tablet (100 mg total) by mouth daily. (Patient taking differently: Take 100 mg by mouth at bedtime.)    Melaton-Thean-Cham-PassF-LBalm (MELATONIN + L-THEANINE) CAPS Take 2  capsules by mouth at bedtime as needed (sleep).    meloxicam (MOBIC) 15 MG tablet TAKE 1 TABLET (15 MG TOTAL) BY MOUTH DAILY. (Patient taking differently: Take 15 mg by mouth daily as needed for pain.)    Multiple Vitamins-Minerals (MULTIVITAMIN GUMMIES ADULT PO) Take 2 capsules by mouth daily.    nicotine (NICODERM CQ - DOSED IN MG/24 HOURS) 21 mg/24hr patch Place 1 patch (21 mg total) onto the skin daily. (Patient not taking: Reported on 09/10/2021) 06/27/2021: Pt hasnt started yet    nicotine polacrilex (COMMIT) 2 MG lozenge Take 1 lozenge (2 mg total) by mouth as needed for smoking cessation. 06/27/2021: Pt hasnt started yet   phenazopyridine (PYRIDIUM) 200 MG tablet Take 200 mg by mouth 3 (three) times daily as needed for pain. (Patient not taking: Reported on 07/08/2021)    pindolol (VISKEN) 10 MG tablet Take 2 tablets (20 mg total) by mouth 2 (two) times daily.    Polyvinyl Alcohol-Povidone (CLEAR EYES ALL SEASONS OP) Place 1 drop into both eyes daily as needed (allergies/itchy eyes).    Probiotic Product (PROBIOTIC PO) Take 2 capsules by mouth daily.    spironolactone (ALDACTONE) 100 MG tablet Take 1 tablet (100 mg total) by mouth at bedtime.    sulfamethoxazole-trimethoprim (BACTRIM DS) 800-160 MG tablet Take 1 tablet by mouth 2 (two) times daily.    torsemide (DEMADEX) 20 MG tablet TAKE 1 TABLET BY MOUTH EVERY DAY MAY TAKE 1 EXTRA TABLET DAILY ONLY AS NEEDED (Patient taking differently: Take 20 mg by mouth every other day.)    TRELEGY ELLIPTA 200-62.5-25 MCG/ACT AEPB TAKE 1 PUFF BY MOUTH EVERY DAY    triamcinolone (KENALOG) 0.025 % ointment APPLY 1 APPLICATION TOPICALLY 2 (TWO) TIMES DAILY AS NEEDED.    No facility-administered encounter medications on file as of 10/11/2021.    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 11/16/2017   OSA (obstructive sleep apnea) 10/30/2016   Left lumbar radiculopathy 06/02/2016   Hyperlipidemia 02/07/2016   T2DM (type 2 diabetes mellitus) ([place]) 12/07/2015   Depression 04/19/2015   Diminished vision 06/23/2014   Chronic diastolic CHF (congestive heart failure) ([place]) 06/13/2014   Restrictive lung disease 05/10/2014    Vaginal discharge 03/22/2014   NASH (nonalcoholic steatohepatitis) 01/04/2014   Hypertension 05/26/2010   Morbid obesity ([place]) 12/17/2006   Tobacco use disorder 12/17/2006    Conditions to be addressed/monitored:HTN  Care Plan : RN Case Manager  Updates made by Lazaro Arms, RN since 10/11/2021 12:00 AM     Problem: Hypertension (Hypertension)   Priority: High  Onset Date: 04/02/2020  Note:   Current Barriers:  Knowledge Deficits related to basic understanding of hypertension pathophysiology and self care management  Nurse Case Manager Clinical Goal(s):  Over the next 30 days, patient will demonstrate improved adherence to prescribed treatment plan for hypertension as evidenced by taking all medications as prescribed, monitoring and recording blood pressure as directed, adhering to low sodium/DASH diet  Interventions: 1:1 collaboration with primary care provider regarding development and update of comprehensive plan of care as evidenced by provider attestation and co-signature Inter-disciplinary care team collaboration (see longitudinal plan of care) Evaluation of current treatment plan related to  self management and patient's  adherence to plan as established by provider   Hypertension: (Status: Goal on Track (progressing): YES.) Long Term Last practice recorded BP readings:  BP Readings from Last 3 Encounters:  09/16/21 (!) 152/95  08/29/21 114/68  08/23/21 (!) 150/84  Most recent eGFR/CrCl:  Lab Results  Component Value Date   EGFR 58 (L) 06/20/2021    No components found for: CRCL   Evaluation of current treatment plan related to hypertension self management and patient's adherence to plan as established by provider. Discussed plans with patient for ongoing care management follow up and provided patient with direct contact information for care management team Advised patient, providing education and rationale, to monitor blood pressure daily and record, calling PCP for  findings outside established parameters.  healthy diet promoted reduction of dietary sodium encouraged medication adherence assessment completed- support and encouragement provided blood pressure trends reviewed-   home or ambulatory blood pressure monitoring encouraged 10/11/21: Speaking with Mrs. Dargan, She is doing good. She denies any headaches, chest pain, or flushing, and she did not check her blood pressure today and had not been checking it regularly. She is taking her medications regularly. We discuss the importance of reviewing and writing the values.    Patient Goals/Self Care Activities: -Patient/Caregiver will self-administer medications as prescribed as evidenced by self-report/primary caregiver report  -Patient/Caregiver will attend all scheduled provider appointments as evidenced by clinician review of documented attendance to scheduled appointments and patient/caregiver report -Patient/Caregiver will call pharmacy for medication refills as evidenced by patient report and review of pharmacy fill history as appropriate -Patient/Caregiver will call provider office for new concerns or questions as evidenced by review of documented incoming telephone call notes and patient report -Patient/Caregiver verbalizes understanding of plan -Patient/Caregiver will focus on medication adherence by taking medications as prescribed -Calls provider office for new concerns, questions, or BP outside discussed parameters -Checks BP and records as discussed -Follows a low sodium diet/DASH diet       Lazaro Arms RN, BSN, [facility] Management Coordinator [place] Phone: [phone] Fax: [phone]

## 2021-10-15 DIAGNOSIS — G4733 Obstructive sleep apnea (adult) (pediatric): Secondary | ICD-10-CM | POA: Diagnosis not present

## 2021-10-23 ENCOUNTER — Ambulatory Visit (INDEPENDENT_AMBULATORY_CARE_PROVIDER_SITE_OTHER): Payer: Commercial Managed Care - HMO | Admitting: Family Medicine

## 2021-10-23 ENCOUNTER — Other Ambulatory Visit: Payer: Self-pay

## 2021-10-23 ENCOUNTER — Other Ambulatory Visit: Payer: Self-pay | Admitting: Student

## 2021-10-23 ENCOUNTER — Ambulatory Visit: Payer: Self-pay

## 2021-10-23 ENCOUNTER — Ambulatory Visit (INDEPENDENT_AMBULATORY_CARE_PROVIDER_SITE_OTHER): Payer: Medicare Other | Admitting: Student

## 2021-10-23 ENCOUNTER — Encounter: Payer: Self-pay | Admitting: Family Medicine

## 2021-10-23 ENCOUNTER — Ambulatory Visit (INDEPENDENT_AMBULATORY_CARE_PROVIDER_SITE_OTHER): Payer: Commercial Managed Care - HMO

## 2021-10-23 ENCOUNTER — Encounter: Payer: Self-pay | Admitting: Student

## 2021-10-23 VITALS — BP 140/90 | HR 82 | Ht 62.0 in | Wt 274.4 lb

## 2021-10-23 VITALS — BP 119/73 | HR 81 | Ht 62.0 in | Wt 274.6 lb

## 2021-10-23 DIAGNOSIS — G8929 Other chronic pain: Secondary | ICD-10-CM

## 2021-10-23 DIAGNOSIS — E790 Hyperuricemia without signs of inflammatory arthritis and tophaceous disease: Secondary | ICD-10-CM

## 2021-10-23 DIAGNOSIS — M25562 Pain in left knee: Secondary | ICD-10-CM

## 2021-10-23 DIAGNOSIS — Z23 Encounter for immunization: Secondary | ICD-10-CM

## 2021-10-23 DIAGNOSIS — N63 Unspecified lump in unspecified breast: Secondary | ICD-10-CM | POA: Diagnosis not present

## 2021-10-23 DIAGNOSIS — M1711 Unilateral primary osteoarthritis, right knee: Secondary | ICD-10-CM | POA: Diagnosis not present

## 2021-10-23 DIAGNOSIS — M25561 Pain in right knee: Secondary | ICD-10-CM | POA: Diagnosis not present

## 2021-10-23 DIAGNOSIS — M5416 Radiculopathy, lumbar region: Secondary | ICD-10-CM | POA: Diagnosis not present

## 2021-10-23 DIAGNOSIS — M1712 Unilateral primary osteoarthritis, left knee: Secondary | ICD-10-CM | POA: Diagnosis not present

## 2021-10-23 MED ORDER — ALLOPURINOL 300 MG PO TABS: 300.0000 mg | ORAL_TABLET | Freq: Every day | ORAL | 1 refills | Status: DC

## 2021-10-23 MED ORDER — LIDOCAINE-PRILOCAINE 2.5-2.5 % EX CREA: 1.0000 "application " | TOPICAL_CREAM | CUTANEOUS | 0 refills | Status: DC | PRN

## 2021-10-23 NOTE — Assessment & Plan Note (Signed)
Under left breast near ribs small mass slightly mobile and rubbery. Last mammogram was normal in 2021. Likely a cyst or lipoma under skin. Denies any weightloss, fevers, bleeding, leakage. No rashes/lesions unlikely to be shingles. -Obtain diagnostic mammogram  -Lidocaine cream for pain on breast

## 2021-10-23 NOTE — Progress Notes (Signed)
° ° °  SUBJECTIVE:   CHIEF COMPLAINT / HPI: Breast Lump  Breast Lump Last mammogram 02/2020 normal. Underneath left breast has small mass that is tender when touching it that started around new years. She says the pain is getting better but that it has stayed the same size. Denies any trauma to the breast. Denies any nipple discharge/crusting, bleeding, and no nipple inversions. No fevers. Having hotflashes now and is going though menopause.   Also needed refill for allopurinol  PERTINENT  PMH / PSH: htn, chf, diabetes, obesity, copd  OBJECTIVE:   BP 119/73    Pulse 81    Ht 5' 2"  (1.575 m)    Wt 274 lb 9.6 oz (124.6 kg)    LMP 04/06/2017    SpO2 97%    BMI 50.22 kg/m   General:NAD, awake, alert, responsive to questions Head: Normocephalic atraumatic Respiratory: no increased work of breathing Breast: Under left breast near rib rubbery slightly mobile mass, no rashes or lesions visualized, no nipple inversion/crusting/bleeding/leakage Chaperone CMA Alexis present for exam   ASSESSMENT/PLAN:   Breast mass in female Under left breast near ribs small mass slightly mobile and rubbery. Last mammogram was normal in 2021. Likely a cyst or lipoma under skin. Denies any weightloss, fevers, bleeding, leakage. No rashes/lesions unlikely to be shingles. -Obtain diagnostic mammogram  -Lidocaine cream for pain on breast   Received pneumococcal vaccine  Gerrit Heck, MD [place]

## 2021-10-23 NOTE — Patient Instructions (Addendum)
Good to see you today.  You had a R and L knee injection.  Call or go to the ER if you develop a large red swollen joint with extreme pain or oozing puss.   I've ordered another lumbar epidural.  Please call [facility] at [phone] or [phone] to schedule.  Ask your doctor about Darcel Bayley. That could help you lose much more weight than Victoza could.   Please get an Xray today before you leave.  Follow-up: as needed

## 2021-10-23 NOTE — Progress Notes (Signed)
I, [name], LAT, ATC, am serving as scribe for Dr. Lynne Leader.  Hailey Barnes is a 61 y.o. female who presents to [place] at [facility] today for B knee pain.  She was last seen by Dr. [name] on 08/12/21 for R thumb pain and had a R 1st CMC joint injection.  She also was referred to Dr. Amedeo Plenty for R CTS.  Today, pt reports B knee pain x years that have worsened over the past few months.  She locates her pain to her B ant knees, L>R.  She has hx of L knee arthroscopic surgery w Dr. Berenice Primas at [facility].  Additionally she notes that her low back pain radiating to the buttocks and posterior thighs has returned.  She last had an epidural steroid injection for this in November and prior to that in July.  These previously worked quite well.  She would like to try a repeat injection if possible.  B knee swelling: yes B knee mechanical symptoms:es Aggravating factors: walking; climbing stairs; squatting Treatments tried: hot shower; CBD roll-on  Diagnostic testing: L knee MRI- 05/24/18  Pertinent review of systems: No fevers or chills  Relevant historical information: Obesity.  Diabetes.  Diabetes controlled with Victoza.   Exam:  BP 140/90 (BP Location: Right Arm, Patient Position: Sitting, Cuff Size: Large)    Pulse 82    Ht 5' 2"  (1.575 m)    Wt 274 lb 6.4 oz (124.5 kg)    LMP 04/06/2017    SpO2 95%    BMI 50.19 kg/m  General: Well Developed, well nourished, and in no acute distress.   MSK: Right knee mild effusion normal motion with crepitation tender palpation medial joint line.  Left knee mild effusion.  Normal motion with crepitation.  Tender palpation medial joint line.  L-spine nontender midline decreased lumbar motion.    Lab and Radiology Results  Procedure: Real-time Ultrasound Guided Injection of left knee superior lateral patellar space Device: Philips Affiniti 50G Images permanently stored and available for review in PACS Verbal  informed consent obtained.  Discussed risks and benefits of procedure. Warned about infection bleeding damage to structures skin hypopigmentation and fat atrophy among others. Patient expresses understanding and agreement Time-out conducted.   Noted no overlying erythema, induration, or other signs of local infection.   Skin prepped in a sterile fashion.   Local anesthesia: Topical Ethyl chloride.   With sterile technique and under real time ultrasound guidance: 40 mg of Kenalog and 2 mL of Marcaine injected into knee joint. Fluid seen entering the joint capsule.   Completed without difficulty   Pain immediately resolved suggesting accurate placement of the medication.   Advised to call if fevers/chills, erythema, induration, drainage, or persistent bleeding.   Images permanently stored and available for review in the ultrasound unit.  Impression: Technically successful ultrasound guided injection.    Procedure: Real-time Ultrasound Guided Injection of right knee superior lateral patellar space Device: Philips Affiniti 50G Images permanently stored and available for review in PACS Verbal informed consent obtained.  Discussed risks and benefits of procedure. Warned about infection bleeding damage to structures skin hypopigmentation and fat atrophy among others. Patient expresses understanding and agreement Time-out conducted.   Noted no overlying erythema, induration, or other signs of local infection.   Skin prepped in a sterile fashion.   Local anesthesia: Topical Ethyl chloride.   With sterile technique and under real time ultrasound guidance: 40 mg of Kenalog and 2 mL of  Marcaine injected into knee joint. Fluid seen entering the joint capsule.   Completed without difficulty   Pain immediately resolved suggesting accurate placement of the medication.   Advised to call if fevers/chills, erythema, induration, drainage, or persistent bleeding.   Images permanently stored and available for  review in the ultrasound unit.  Impression: Technically successful ultrasound guided injection.     EXAM: MRI LUMBAR SPINE WITHOUT CONTRAST   TECHNIQUE: Multiplanar, multisequence MR imaging of the lumbar spine was performed. No intravenous contrast was administered.   COMPARISON:  Prior MRI from 06/06/2015.   FINDINGS: Segmentation: Standard. Lowest well-formed disc labeled the L5-S1 level.   Alignment: 4 mm anterolisthesis of L4 on L5, chronic and facet mediated. Alignment otherwise normal with preservation of the normal lumbar lordosis.   Vertebrae: Vertebral body height maintained without evidence for acute or chronic fracture. Bone marrow signal intensity mildly heterogeneous but within normal limits without discrete or worrisome osseous lesion. Reactive marrow edema present about the bilateral L2-3 facets due to facet arthritis. Minimal reactive endplate changes noted about the L2-3 and L4-5 interspaces. No other abnormal marrow edema.   Conus medullaris and cauda equina: Conus extends to the L1 level. Conus and cauda equina appear normal.   Paraspinal and other soft tissues: Paraspinous soft tissues within normal limits. 2.2 cm right adrenal nodule noted, indeterminate, but could reflect a small adenoma. Visualized visceral structures otherwise unremarkable.   Disc levels:   T11-12: Seen only on sagittal projection. Diffuse disc bulge with disc desiccation and intervertebral disc space narrowing. No significant stenosis or impingement.   T12-L1: Unremarkable.   L1-2:  Unremarkable.   L2-3: Mild diffuse disc bulge with disc desiccation. Moderate right worse than left facet hypertrophy with associated reactive marrow edema. No significant spinal stenosis. Moderate right worse than left L2 foraminal narrowing.   L3-4: Negative interspace. Moderate bilateral facet hypertrophy. Mild epidural lipomatosis. Borderline mild canal with left lateral recess  stenosis. Mild bilateral L3 foraminal narrowing. No impingement.   L4-5: 4 mm anterolisthesis. Associated broad posterior pseudo disc bulge/uncovering. Severe bilateral facet arthrosis with associated small joint effusions. Resultant moderate bilateral subarticular stenosis with bilateral L4 foraminal narrowing. Either of the L4 or descending L5 nerve roots could be affected.   L5-S1: Negative interspace. Mild to moderate facet hypertrophy. Mild epidural lipomatosis. No significant canal stenosis. Mild bilateral L5 foraminal narrowing.   IMPRESSION: 1. 4 mm facet mediated anterolisthesis of L4 on L5 with associated moderate bilateral foraminal and subarticular stenosis. Either of the exiting L4 or descending L5 nerve roots could be affected. 2. Disc bulging with facet hypertrophy at L2-3 with resultant moderate right worse than left L2 foraminal stenosis. 3. Reactive marrow edema about the bilateral L2-3 facets due to facet arthritis, which could contribute to underlying low back pain. 4. Additional facet hypertrophy at L3-4 and L5-S1 without significant stenosis or impingement.     Electronically Signed   By: Jeannine Boga M.D.   On: 05/15/2019 19:50   I, Lynne Leader, personally (independently) visualized and performed the interpretation of the images attached in this note.   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  No acute fractures.  Left knee: Moderate to severe medial compartment DJD.  Mild to moderate patellofemoral DJD.  No acute fractures.  Await formal radiology review  Assessment and Plan: 61 y.o. female with  Bilateral knee pain left worse than right.  Pain thought  to be due to DJD exacerbation.  Plan for steroid injection today.  If this does not work well enough can move to hyaluronic acid injection series.  Ultimately she will  need knee replacement however her BMI is 50 which is too much for her knee replacement.  BMI needs to be 40 or less.    BMI 50: She will struggle to get her weight sufficiently low enough to be safe for knee replacement.  We discussed some strategies for this including [place] surgery.  She does not want to consider bariatric surgery for now.  She does have diabetes which is currently managed with a GLP-1 agonist Victoza which she tolerates quite well.  However newer medications such as Montanaro or Ozempic may be a great option for her as she is likely to experience significant weight loss with this medication than Victoza.  Discussed this with Rise Paganini and we will send a message to PCP.  Low back pain: Currently managed intermittently with epidural steroid injections.  Plan for repeat epidural steroid injection today.  However if this does not last May consider removing 2 facet injection or medial branch block and ablation planning.   PDMP not reviewed this encounter. Orders Placed This Encounter  Procedures   [place] LIMITED JOINT SPACE STRUCTURES LOW BILAT(NO LINKED CHARGES)    Order Specific Question:   Reason for Exam (SYMPTOM  OR DIAGNOSIS REQUIRED)    Answer:   B knee pain    Order Specific Question:   Preferred imaging location?    Answer:   [place]   DG Knee AP/LAT W/Sun[place] Left    Standing Status:   Future    Number of Occurrences:   1    Standing Expiration Date:   11/23/2021    Order Specific Question:   Reason for Exam (SYMPTOM  OR DIAGNOSIS REQUIRED)    Answer:   L knee pain    Order Specific Question:   Is patient pregnant?    Answer:   No    Order Specific Question:   Preferred imaging location?    Answer:   [facility]   DG Knee AP/LAT W/Sunrise Right    Standing Status:   Future    Number of Occurrences:   1    Standing Expiration Date:   11/23/2021    Order Specific Question:   Reason for Exam (SYMPTOM  OR DIAGNOSIS REQUIRED)    Answer:    R knee pain    Order Specific Question:   Is patient pregnant?    Answer:   No    Order Specific Question:   Preferred imaging location?    Answer:   [facility]   DG INJECT DIAG/THERA/INC NEEDLE/CATH/PLC EPI/LUMB/SAC W/IMG    Repeat Lumbar epidural for lumbar radiculopathy.  Technique and level per radiology.    Standing Status:   Future    Standing Expiration Date:   10/23/2022    Scheduling Instructions:     Repeat lumbar epidural for lumbar radiculopathy.  Technique and level per radiology.    Order Specific Question:   Reason for Exam (SYMPTOM  OR DIAGNOSIS REQUIRED)    Answer:   lumbar radiculopathy    Order Specific Question:   Is the patient pregnant?    Answer:   No    Order Specific Question:   Preferred Imaging Location?    Answer:   [facility]   No orders of the defined types were placed in this encounter.  Discussed warning signs or symptoms. Please see discharge instructions. Patient expresses understanding.   The above documentation has been reviewed and is accurate and complete Lynne Leader, M.D.

## 2021-10-23 NOTE — Patient Instructions (Signed)
It was great to see you! Thank you for allowing me to participate in your care!   I recommend that you always bring your medications to each appointment as this makes it easy to ensure we are on the correct medications and helps [place] not miss when refills are needed.  Our plans for today:  - I have attached the breast center's number to schedule the mammogram  - I have sent in some lidocaine cream for pain - getting pneumococcal vaccine today!   Take care and seek immediate care sooner if you develop any concerns. Please remember to show up 15 minutes before your scheduled appointment time!  Gerrit Heck, MD [place]

## 2021-10-24 NOTE — Progress Notes (Signed)
Severe arthritis present medial knee present.

## 2021-10-24 NOTE — Progress Notes (Signed)
Severe arthritis present in the medial knee.

## 2021-10-29 ENCOUNTER — Ambulatory Visit: Payer: Commercial Managed Care - HMO | Admitting: Internal Medicine

## 2021-10-30 ENCOUNTER — Ambulatory Visit (HOSPITAL_COMMUNITY): Payer: Medicare Other | Attending: Cardiology

## 2021-10-30 ENCOUNTER — Other Ambulatory Visit: Payer: Self-pay

## 2021-10-30 DIAGNOSIS — I340 Nonrheumatic mitral (valve) insufficiency: Secondary | ICD-10-CM | POA: Diagnosis not present

## 2021-10-30 LAB — ECHOCARDIOGRAM COMPLETE
Area-P 1/2: 3.24 cm2
S' Lateral: 2.7 cm

## 2021-10-31 ENCOUNTER — Ambulatory Visit
Admission: RE | Admit: 2021-10-31 | Discharge: 2021-10-31 | Disposition: A | Discharge status: Home / Self Care | Payer: Commercial Managed Care - HMO | Source: Ambulatory Visit | Attending: Family Medicine | Admitting: Family Medicine

## 2021-10-31 DIAGNOSIS — M5416 Radiculopathy, lumbar region: Secondary | ICD-10-CM

## 2021-10-31 DIAGNOSIS — M47817 Spondylosis without myelopathy or radiculopathy, lumbosacral region: Secondary | ICD-10-CM | POA: Diagnosis not present

## 2021-10-31 MED ORDER — METHYLPREDNISOLONE ACETATE 40 MG/ML INJ SUSP (RADIOLOG
80.0000 mg | Freq: Once | INTRAMUSCULAR | Status: AC
  Administered 2021-10-31: 80 mg via EPIDURAL

## 2021-10-31 MED ORDER — IOPAMIDOL (ISOVUE-M 200) INJECTION 41%
1.0000 mL | Freq: Once | INTRAMUSCULAR | Status: AC
  Administered 2021-10-31: 1 mL via EPIDURAL

## 2021-10-31 NOTE — Discharge Instructions (Signed)

## 2021-11-06 ENCOUNTER — Telehealth: Payer: Self-pay | Admitting: Pharmacist

## 2021-11-06 DIAGNOSIS — F17200 Nicotine dependence, unspecified, uncomplicated: Secondary | ICD-10-CM

## 2021-11-06 NOTE — Telephone Encounter (Signed)
-----   Message from Leavy Cella, [place] sent at 10/04/2021  9:17 AM EST ----- Regarding: Tobacco Cessation Interest Assessment

## 2021-11-06 NOTE — Telephone Encounter (Signed)
Noted and agree. 

## 2021-11-06 NOTE — Assessment & Plan Note (Signed)
Patient contacted for follow/up of tobacco intake reduction attempt.  Since last contact patient reports that she is smoking ~ 1 ppd.  She also reports that she plans to restart her nicotine lozenges today and decrease her smoking with a plan to completely stop smoking in the next few weeks.  Medications currently being used; None currently but has used  Nicotine patch and lozenges recently Patient  reports having additional support to help quitting at this time.  States that her support wants her to live forever.  She reports commitment to attempting reduction at this time.

## 2021-11-06 NOTE — Telephone Encounter (Signed)
Patient contacted for follow/up of tobacco intake reduction attempt.  Since last contact patient reports that she is smoking ~ 1 ppd.  She also reports that she plans to restart her nicotine lozenges today and decrease her smoking with a plan to completely stop smoking in the next few weeks.  Medications currently being used; None currently but has used  Nicotine patch and lozenges recently Patient  reports having additional support to help quitting at this time.  States that her support wants her to live forever.  She reports commitment to attempting reduction at this time.   Total time with patient call and documentation of interaction: 13 minutes.  F/U Phone call planned:  2 weeks.

## 2021-11-14 ENCOUNTER — Encounter: Payer: Self-pay | Admitting: Family Medicine

## 2021-11-19 ENCOUNTER — Ambulatory Visit
Admission: RE | Admit: 2021-11-19 | Discharge: 2021-11-19 | Disposition: A | Discharge status: Home / Self Care | Payer: Medicare Other | Source: Ambulatory Visit | Attending: Family Medicine | Admitting: Family Medicine

## 2021-11-19 ENCOUNTER — Other Ambulatory Visit: Payer: Self-pay | Admitting: Student

## 2021-11-19 ENCOUNTER — Ambulatory Visit
Admission: RE | Admit: 2021-11-19 | Discharge: 2021-11-19 | Disposition: A | Discharge status: Home / Self Care | Payer: Commercial Managed Care - HMO | Source: Ambulatory Visit | Attending: Family Medicine | Admitting: Family Medicine

## 2021-11-19 DIAGNOSIS — R922 Inconclusive mammogram: Secondary | ICD-10-CM | POA: Diagnosis not present

## 2021-11-19 DIAGNOSIS — N63 Unspecified lump in unspecified breast: Secondary | ICD-10-CM

## 2021-11-19 DIAGNOSIS — N644 Mastodynia: Secondary | ICD-10-CM | POA: Diagnosis not present

## 2021-11-28 ENCOUNTER — Other Ambulatory Visit: Payer: Self-pay

## 2021-11-28 ENCOUNTER — Ambulatory Visit (INDEPENDENT_AMBULATORY_CARE_PROVIDER_SITE_OTHER): Payer: Medicare Other | Admitting: Family Medicine

## 2021-11-28 ENCOUNTER — Ambulatory Visit: Payer: Self-pay

## 2021-11-28 ENCOUNTER — Telehealth: Payer: Self-pay | Admitting: Pharmacist

## 2021-11-28 VITALS — BP 130/84 | HR 84 | Ht 62.0 in | Wt 263.6 lb

## 2021-11-28 DIAGNOSIS — M79641 Pain in right hand: Secondary | ICD-10-CM

## 2021-11-28 DIAGNOSIS — G5603 Carpal tunnel syndrome, bilateral upper limbs: Secondary | ICD-10-CM | POA: Diagnosis not present

## 2021-11-28 DIAGNOSIS — M79642 Pain in left hand: Secondary | ICD-10-CM

## 2021-11-28 NOTE — Progress Notes (Signed)
I, Hailey Barnes, LAT, ATC acting as a scribe for Hailey Leader, MD.  Hailey Barnes is a 61 y.o. female who presents to [place] at [facility] today for bilat hand pain, R>L. Pt was last seen by Dr. 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 Today, pt reports bilat hand pain start about 1 week ago after doing a lot of house cleaning. Pt had to r/s her appointment w/ Dr. Amedeo Plenty and now has a visit scheduled for March or April. Pt locates pain to all fingers and hands. Pt c/o pain waking her up at night.  Grip strength: decreased Numbness/tingling: yes- into finger, feels like "glass" Aggravates: using hands in general Treatments tried: none  Dx testing: 06/04/21 NCV w/ EMG  04/24/21 L hand XR    Pertinent review of systems: No fevers or chills  Relevant historical information: Obesity and diabetes.  She has lost 10 pounds intentionally.   Exam:  BP 130/84    Pulse 84    Ht 5' 2"  (1.575 m)    Wt 263 lb 9.6 oz (119.6 kg)    LMP 04/06/2017    SpO2 93%    BMI 48.21 kg/m  General: Well Developed, well nourished, and in no acute distress.   MSK:   Right hand: No thenar atrophy.  Normal hand and wrist motion.  Positive Tinel's at carpal tunnel.  Grip strength is intact.  Left hand: No thenar atrophy.  Normal hand and wrist motion.  Positive Tinel's at carpal tunnel.  Grip strength is intact.    Lab and Radiology Results  Procedure: Real-time Ultrasound Guided hydrodissection of median nerve right carpal tunnel Device: Philips Affiniti 50G Images permanently stored and available for review in PACS Verbal informed consent obtained.  Discussed risks and benefits of procedure. Warned about infection bleeding damage to structures skin hypopigmentation and fat atrophy among others. Patient expresses understanding and agreement Time-out conducted.   Noted no overlying erythema, induration, or other signs of  local infection.   Skin prepped in a sterile fashion.   Local anesthesia: Topical Ethyl chloride.   With sterile technique and under real time ultrasound guidance: 40 mg of Kenalog and 1 mL of lidocaine injected into carpal tunnel around median nerve. Fluid seen entering the carpal tunnel tunnel.   Completed without difficulty   Pain immediately resolved suggesting accurate placement of the medication.   Advised to call if fevers/chills, erythema, induration, drainage, or persistent bleeding.   Images permanently stored and available for review in the ultrasound unit.  Impression: Technically successful ultrasound guided injection.    Procedure: Real-time Ultrasound Guided hydrodissection of median nerve left carpal tunnel Device: Philips Affiniti 50G Images permanently stored and available for review in PACS Verbal informed consent obtained.  Discussed risks and benefits of procedure. Warned about infection bleeding damage to structures skin hypopigmentation and fat atrophy among others. Patient expresses understanding and agreement Time-out conducted.   Noted no overlying erythema, induration, or other signs of local infection.   Skin prepped in a sterile fashion.   Local anesthesia: Topical Ethyl chloride.   With sterile technique and under real time ultrasound guidance: 40 mg of Kenalog and 1 mL of lidocaine injected into carpal tunnel around median nerve. Fluid seen entering the carpal tunnel.   Completed without difficulty   Pain immediately resolved suggesting accurate placement of the medication.   Advised to call if fevers/chills, erythema,  induration, drainage, or persistent bleeding.   Images permanently stored and available for review in the ultrasound unit.  Impression: Technically successful ultrasound guided injection.        EMG/NVC 06/04/21 Patient: Hailey Barnes DOB: Jan 04, 1961 Physician: Narda Amber, DO  Sex: Female Height: 5' 6"  Ref Phys: Michiel Cowboy, MD  ID#:  [id number]     Technician:      Patient Complaints: This is a 61 year old female referred for evaluation of bilateral hand paresthesias.   NCV & EMG Findings: Extensive electrodiagnostic testing of the right upper extremity and additional studies of the left shows:  Right median sensory response is absent.  Left median sensory response shows prolonged latency (4.9 ms) and reduced amplitude (10.3 V).  Bilateral ulnar sensory responses are within normal limits. Bilateral median motor responses show prolonged latency (R7.2, L5.5 ms).  Left ulnar motor response shows asymmetrically reduced amplitude (L7.8, R14.0 mV) and slowed conduction velocity across the elbow (A Elbow-B Elbow, 32 m/s).  Right ulnar motor responses within normal limits.   Chronic motor axonal loss changes are seen affecting the right abductor pollicis brevis muscle, without accompanying active denervation.     Impression: Bilateral median neuropathy at or distal to the wrist, consistent with a clinical diagnosis of carpal tunnel syndrome.  Overall, these findings are severe on the right and moderate on the left. Left ulnar neuropathy with slowing across the elbow, purely demyelinating, mild.     ___________________________ Narda Amber, DO      Assessment and Plan: 61 y.o. female with bilateral carpal tunnel syndrome.  She is very symptomatic right now and unfortunately is going to have at least 1 month wait until she can see Dr. Amedeo Plenty to consider surgery.  Ultimately I do think surgery is her best option.  Plan for steroid injection today and recheck as needed.  Follow-up with Dr. Amedeo Plenty.  Recheck with me PRN.    PDMP not reviewed this encounter. Orders Placed This Encounter  Procedures   [place] LIMITED JOINT SPACE STRUCTURES UP BILAT(NO LINKED CHARGES)    Standing Status:   Future    Number of Occurrences:   1    Standing Expiration Date:   05/28/2022    Order Specific Question:   Reason for Exam (SYMPTOM  OR DIAGNOSIS  REQUIRED)    Answer:   bilateral hand pain    Order Specific Question:   Preferred imaging location?    Answer:   [place]   No orders of the defined types were placed in this encounter.    Discussed warning signs or symptoms. Please see discharge instructions. Patient expresses understanding.   The above documentation has been reviewed and is accurate and complete Hailey Barnes, M.D.

## 2021-11-28 NOTE — Patient Instructions (Addendum)
Thank you for coming in today.   You received an injection today. Seek immediate medical attention if the joint becomes red, extremely painful, or is oozing fluid.   Wrist braces  Be sure to keep your visit with Dr. Gilmore Laroche back with me as needed

## 2021-11-28 NOTE — Telephone Encounter (Signed)
-----   Message from Leavy Cella, [place] sent at 11/06/2021 11:56 AM EST ----- Regarding: Quit ?

## 2021-11-28 NOTE — Progress Notes (Signed)
° ° °  SUBJECTIVE:   CHIEF COMPLAINT / HPI:   Concern for urinary tract infection: 61 year old female presenting for concerns of urinary tract infection.  She states she was having burning with urination for about a week. She also has suprapubic pain and urinary frequency.  PERTINENT  PMH / PSH: None relevant  OBJECTIVE:   BP 129/86    Pulse 84    Ht 5' 2"  (1.575 m)    Wt 265 lb (120.2 kg)    LMP 04/06/2017    SpO2 97%    BMI 48.47 kg/m    General: NAD, pleasant, able to participate in exam Cardiac: RRR, no murmurs. Respiratory: CTAB, normal effort, No wheezes, rales or rhonchi Abdomen: Bowel sounds present, no significant abdominal discomfort or suprapubic discomfort present.  Negative CVA testing Psych: Normal affect and mood  ASSESSMENT/PLAN:   Urinary tract infection: Patient presenting with 1 week of dysuria, urinary frequency, mild suprapubic discomfort.  Urinalysis with cloudy urine, greater than 1000 glucose though she is on Jardiance.  Did show signs of urinary tract infection with small leukocytes positive nitrite and moderate bacteria.  Will treat with Keflex.  We will get a urine culture in case we need to make any changes with antibiotic.  Follow-up as needed.  Lurline Del, [place]

## 2021-11-28 NOTE — Telephone Encounter (Signed)
Patient contacted for follow/up of tobacco intake reduction / cessation attempt.   Since last contact patient reports no reduction in intake number however she reported switching   Medications currently being used;  Nicotine Lozenge - 29m (however patient states too strong)  Patient has had two extended family members pass away in the last week.  She is not prepared to make any progress at this time.   We discussed timing of next follow-up as 2-3 weeks.  I will plan to contact her by phone.   Total time with patient call and documentation of interaction: 13 minutes.  F/U Phone call planned: ~ 3 weeks

## 2021-11-29 ENCOUNTER — Other Ambulatory Visit: Payer: Self-pay

## 2021-11-29 ENCOUNTER — Encounter: Payer: Self-pay | Admitting: Family Medicine

## 2021-11-29 ENCOUNTER — Ambulatory Visit (INDEPENDENT_AMBULATORY_CARE_PROVIDER_SITE_OTHER): Payer: Medicare Other | Admitting: Family Medicine

## 2021-11-29 ENCOUNTER — Telehealth: Payer: Self-pay

## 2021-11-29 ENCOUNTER — Telehealth: Payer: Medicare Other

## 2021-11-29 VITALS — BP 129/86 | HR 84 | Ht 62.0 in | Wt 265.0 lb

## 2021-11-29 DIAGNOSIS — R399 Unspecified symptoms and signs involving the genitourinary system: Secondary | ICD-10-CM

## 2021-11-29 LAB — POCT URINALYSIS DIP (MANUAL ENTRY)
Bilirubin, UA: NEGATIVE
Glucose, UA: >=1000 mg/dL — AB
Ketones, POC UA: NEGATIVE mg/dL
Nitrite, UA: POSITIVE — AB
Protein Ur, POC: 30 mg/dL — AB
Spec Grav, UA: 1.01 (ref 1.010–1.025)
Urobilinogen, UA: 0.2 E.U./dL
pH, UA: 6 (ref 5.0–8.0)

## 2021-11-29 LAB — POCT UA - MICROSCOPIC ONLY: WBC, Ur, HPF, POC: >20 (ref 0–5)

## 2021-11-29 MED ORDER — CEPHALEXIN 500 MG PO CAPS: 500.0000 mg | ORAL_CAPSULE | Freq: Two times a day (BID) | ORAL | 0 refills | Status: AC

## 2021-11-29 NOTE — Telephone Encounter (Signed)
° °  RN Case Manager Care Management   Phone Outreach    11/29/2021 Name: Hailey Barnes MRN: [id number] DOB: Nov 18, 1960  Hailey Barnes is a 61 y.o. year old female who is a primary care patient of Lurline Del, DO .   Telephone outreach was unsuccessful A HIPPA compliant phone message was left for the patient providing contact information and requesting a return call.   Follow Up Plan: Will route chart to Care Guide to see if patient would like to reschedule phone appointment.    Review of patient status, including review of consultants reports, relevant laboratory and other test results, and collaboration with appropriate care team members and the patient's provider was performed as part of comprehensive patient evaluation and provision of care management services.    Lazaro Arms RN, BSN, [facility] Care Management Coordinator [place] Phone: [phone] Fax: [phone]

## 2021-11-29 NOTE — Patient Instructions (Addendum)
You have a urinary tract infection.  I will treat with an antibiotic for the next 7 days.  We will also check a urine culture and if we need to change the antibiotic I will call you.  When we follow-up for diabetes we can discuss if we want to try changing your Victoza to either Ozempic or Mounjaro.   We can have further discussions about this.

## 2021-12-02 ENCOUNTER — Other Ambulatory Visit: Payer: Self-pay | Admitting: Family Medicine

## 2021-12-02 LAB — URINE CULTURE

## 2021-12-02 MED ORDER — NITROFURANTOIN MONOHYD MACRO 100 MG PO CAPS: 100.0000 mg | ORAL_CAPSULE | Freq: Two times a day (BID) | ORAL | 0 refills | Status: AC

## 2021-12-03 ENCOUNTER — Other Ambulatory Visit: Payer: Self-pay | Admitting: Family Medicine

## 2021-12-03 NOTE — Telephone Encounter (Signed)
Rescheduled 12/10/21.   [place] Management  Direct Dial: [phone]

## 2021-12-06 ENCOUNTER — Telehealth: Payer: Medicare Other

## 2021-12-09 ENCOUNTER — Telehealth: Payer: Self-pay | Admitting: Cardiology

## 2021-12-09 DIAGNOSIS — R000 Tachycardia, unspecified: Secondary | ICD-10-CM | POA: Diagnosis not present

## 2021-12-09 DIAGNOSIS — I10 Essential (primary) hypertension: Secondary | ICD-10-CM | POA: Diagnosis not present

## 2021-12-09 DIAGNOSIS — R42 Dizziness and giddiness: Secondary | ICD-10-CM | POA: Diagnosis not present

## 2021-12-09 NOTE — Telephone Encounter (Signed)
Spoke with pt who reports earlier today she was cleaning in the bathroom with bleach.  She became dizzy and felt over heated.  She got in the shower with cool water and tried taking deep breaths.  EMS was called and she was checked out at home.  HR was around 150 bpm but has since returned to her normal rate.  She has been, in the past scheduled for SVT ablation with Dr Rayann Heman but this was cancelled due to UTI.  Pt's care was to be transferred to Dr Lovena Le.   Pt reports this is the first episode she has had since September or October of last year.  She will continue to monitor heart rates and take medications as listed.  Advised I will forward to  MD for review and any new orders/follow up.  Pt will call back with any further concerns.

## 2021-12-09 NOTE — Telephone Encounter (Signed)
Patient c/o Palpitations:  High priority if patient c/o lightheadedness, shortness of breath, or chest pain  How long have you had palpitations/irregular HR/ Afib? Are you having the symptoms now?  Patient states palpitations developed today. No symptoms currently.  Are you currently experiencing lightheadedness, SOB or CP?  No   Do you have a history of afib (atrial fibrillation) or irregular heart rhythm?  Hx of palpitations  Have you checked your BP or HR? (document readings if available):  136/70 150 (taken by EMS about 1 hour ago)   Are you experiencing any other symptoms?  No   Patient states today her heart has been racing. She called 911 about an hour ago and HR was at 150. She is unsure of her current reading. States she is feeling better, but still wanted to follow up with cardiology.

## 2021-12-10 ENCOUNTER — Ambulatory Visit: Payer: Medicare Other

## 2021-12-10 NOTE — Chronic Care Management (AMB) (Signed)
Chronic Care Management   CCM RN Visit Note  12/10/2021 Name: Hailey Barnes MRN: [id number] DOB: 05-Jun-1961  Subjective: Hailey Barnes is a 61 y.o. year old female who is a primary care patient of Hailey Del, DO. The care management team was consulted for assistance with disease management and care coordination needs.    Engaged with patient by telephone for follow up visit in response to provider referral for case management and/or care coordination services.   Consent to Services:  The patient was given information about Chronic Care Management services, agreed to services, and gave verbal consent prior to initiation of services.  Please see initial visit note for detailed documentation.   Patient agreed to services and verbal consent obtained.    Summary:  The patient continues to maintain positive progress with care plan goals. See Care Plan below for interventions and patient self-care actives.  Recommendation: The patient may benefit from taking meds as prescribed, and continue to be active; if using bleach or cleaning solutions that can affect your breathing make sure the area is well ventilated.  The patient agrees.  Follow up Plan: Patient would like continued follow-up.  CCM RNCM will outreach the patient within the next 8 weeks.  Patient will call office if needed prior to next encounter   Assessment: Review of patient past medical history, allergies, medications, health status, including review of consultants reports, laboratory and other test data, was performed as part of comprehensive evaluation and provision of chronic care management services.   SDOH (Social Determinants of Health) assessments and interventions performed:  No  CCM Care Plan    Conditions to be addressed/monitored:HTN  Care Plan : RN Case Manager  Updates made by Hailey Arms, RN since 12/10/2021 12:00 AM     Problem: Hypertension (Hypertension)   Priority: High  Onset Date:  04/02/2020  Note:   Current Barriers:  Knowledge Deficits related to basic understanding of hypertension pathophysiology and self care management  Nurse Case Manager Clinical Goal(s):  Over the next 30 days, patient will demonstrate improved adherence to prescribed treatment plan for hypertension as evidenced by taking all medications as prescribed, monitoring and recording blood pressure as directed, adhering to low sodium/DASH diet  Interventions: 1:1 collaboration with primary care provider regarding development and update of comprehensive plan of care as evidenced by provider attestation and co-signature Inter-disciplinary care team collaboration (see longitudinal plan of care) Evaluation of current treatment plan related to  self management and patient's adherence to plan as established by provider   Hypertension: (Status: Goal on Track (progressing): YES.) Long Term Last practice recorded BP readings:  BP Readings from Last 3 Encounters:  11/29/21 129/86  11/28/21 130/84  10/31/21 (!) 154/90  Most recent eGFR/CrCl:  Lab Results  Component Value Date   EGFR 58 (L) 06/20/2021    No components found for: CRCL   Evaluation of current treatment plan related to hypertension self management and patient's adherence to plan as established by provider. Discussed plans with patient for ongoing care management follow up and provided patient with direct contact information for care management team healthy diet promoted medication adherence assessment completed- support and encouragement provided 12/10/21: Hailey Barnes said she had an incident yesterday while cleaning; she sprayed some bleach and became dizzy, weak, heart racing, and clammy.  Her friend called EMS; Hailey Barnes was examined, and everything was normal.  We discussed using those types of cleaning solutions to ventilate the area well.   She denies symptoms of  hypertension; her reading was 135/70, and her O2 sat was 96%.  She is trying  to be more active and has lost 10 lbs.  I advised her to continue with her medications, stay hydrated, and be active.   Patient Goals/Self Care Activities: -Patient/Caregiver will self-administer medications as prescribed as evidenced by self-report/primary caregiver report  -Patient/Caregiver will attend all scheduled provider appointments as evidenced by clinician review of documented attendance to scheduled appointments and patient/caregiver report -Patient/Caregiver will call pharmacy for medication refills as evidenced by patient report and review of pharmacy fill history as appropriate -Patient/Caregiver will call provider office for new concerns or questions as evidenced by review of documented incoming telephone call notes and patient report -Patient/Caregiver verbalizes understanding of plan -Patient/Caregiver will focus on medication adherence by taking medications as prescribed -Calls provider office for new concerns, questions, or BP outside discussed parameters -Checks BP and records as discussed -Follows a low sodium diet/DASH diet - Ventilate room when using cleaning solutions      Hailey Arms RN, BSN, [facility] Care Management Coordinator Hailey Barnes  Phone: [phone]

## 2021-12-10 NOTE — Progress Notes (Signed)
° ° °  SUBJECTIVE:   CHIEF COMPLAINT / HPI:   UTI follow up: Previously diagnosed with UTI which showed significant resistance. She was started on nitrofurantoin which was showed intermediate resistance due to lack of other available options. She continues to have symptoms. Discussed with ID who recommended fosfomycin 3 g by mouth every other day for 3 doses. She states she has burning with urination that did not improve with the nitrofuritonin. She also endorses urinary frequency and suprapubic discomfort.   PERTINENT  PMH / PSH: Recent urine culture showing E. coli with multiple drug resistance  OBJECTIVE:   BP 123/85    Pulse 85    Ht 5' 2"  (1.575 m)    Wt 262 lb 4 oz (119 kg)    LMP 04/06/2017    SpO2 95%    BMI 47.97 kg/m    General: NAD, pleasant, able to participate in exam Cardiac: RRR, no murmurs. Respiratory: CTAB, normal effort Abdomen: Bowel sounds present, suprapubic discomfort present, no CVA tenderness bilaterally Psych: Normal affect and mood  ASSESSMENT/PLAN:    UTI symptoms: Symptoms persist after being diagnosed with a urinary tract infection at her last visit.  At that time she grew E. coli with multiple resistances.  We started nitrofurantoin which showed intermediate resistance.  She endorses no improvement of her symptoms and still has urinary frequency, dysuria, suprapubic discomfort.  She does not have any fevers.  She has no CVA tenderness bilaterally.  She continues on Jardiance which we are discontinuing today.  UA today shows trace leukocytes with no nitrite.  This suggest against a current infection.  We will send a urine culture. If positive will use fosfomycin 3 g by mouth every other day for 3 doses for treatment of resistant UTI.Discussed return precautions.  I did discuss with her that we could try a treatment for interstitial cystitis as this may be the cause of her discomfort such as low-dose amitriptyline.  Also discussed this with attending Dr. Nori Riis.   After ongoing discussion with the patient we will hold off on this until the culture comes back and decide neck steps based off of that.  Lurline Del, [place]

## 2021-12-10 NOTE — Patient Instructions (Signed)
Visit Information  Ms. Jarema  it was nice speaking with you. Please call me directly [phone] if you have questions about the goals we discussed.  Patient Goals/Self Care Activities: -Patient/Caregiver will self-administer medications as prescribed as evidenced by self-report/primary caregiver report  -Patient/Caregiver will attend all scheduled provider appointments as evidenced by clinician review of documented attendance to scheduled appointments and patient/caregiver report -Patient/Caregiver will call pharmacy for medication refills as evidenced by patient report and review of pharmacy fill history as appropriate -Patient/Caregiver will call provider office for new concerns or questions as evidenced by review of documented incoming telephone call notes and patient report -Patient/Caregiver verbalizes understanding of plan -Patient/Caregiver will focus on medication adherence by taking medications as prescribed -Calls provider office for new concerns, questions, or BP outside discussed parameters -Checks BP and records as discussed -Follows a low sodium diet/DASH diet - Ventilate room when using cleaning solutions     Patient verbalizes understanding of instructions and care plan provided today and agrees to view in [place]. Active MyChart status confirmed with patient.    Follow up Plan: Patient would like continued follow-up.  CCM RNCM will outreach the patient within the next 8 weeks.  Patient will call office if needed prior to next encounter  Lazaro Arms, RN  [phone]

## 2021-12-11 ENCOUNTER — Ambulatory Visit (INDEPENDENT_AMBULATORY_CARE_PROVIDER_SITE_OTHER): Payer: Medicare Other | Admitting: Family Medicine

## 2021-12-11 ENCOUNTER — Other Ambulatory Visit: Payer: Self-pay

## 2021-12-11 ENCOUNTER — Encounter: Payer: Self-pay | Admitting: Family Medicine

## 2021-12-11 VITALS — BP 123/85 | HR 85 | Ht 62.0 in | Wt 262.2 lb

## 2021-12-11 DIAGNOSIS — R399 Unspecified symptoms and signs involving the genitourinary system: Secondary | ICD-10-CM

## 2021-12-11 LAB — POCT UA - MICROSCOPIC ONLY

## 2021-12-11 LAB — POCT URINALYSIS DIP (MANUAL ENTRY)
Bilirubin, UA: NEGATIVE
Blood, UA: NEGATIVE
Glucose, UA: 250 mg/dL — AB
Ketones, POC UA: NEGATIVE mg/dL
Nitrite, UA: NEGATIVE
Protein Ur, POC: NEGATIVE mg/dL
Spec Grav, UA: 1.01 (ref 1.010–1.025)
Urobilinogen, UA: 0.2 E.U./dL
pH, UA: 6 (ref 5.0–8.0)

## 2021-12-11 MED ORDER — FOSFOMYCIN TROMETHAMINE 3 G PO PACK: 3.0000 g | PACK | ORAL | 0 refills | Status: DC

## 2021-12-11 NOTE — Patient Instructions (Signed)
We are getting a urine culture.  If the urine culture shows significant growth we will provide a new antibiotic.  If it does not we will discuss next steps.  I do want you to stop your Jardiance as this may improve your symptoms.

## 2021-12-14 LAB — URINE CULTURE: Organism ID, Bacteria: NO GROWTH

## 2021-12-19 ENCOUNTER — Telehealth: Payer: Self-pay | Admitting: Pharmacist

## 2021-12-19 NOTE — Telephone Encounter (Signed)
Attempted to contact patient for follow-up of tobacco intake reduction / cessation.  ? ? ?Left HIPAA compliant voice mail requesting call back to direct phone: [phone] ? ? ?Total time with patient call and documentation of interaction: 3 minutes. ? ?Additional F/U Phone call planned: within the next week.  ? ?

## 2021-12-19 NOTE — Telephone Encounter (Signed)
-----   Message from Leavy Cella, [place] sent at 11/28/2021  5:19 PM EST ----- ?Regarding: 2 mg lozenge in place of previous 30m? ? ? ?

## 2021-12-25 DIAGNOSIS — M1812 Unilateral primary osteoarthritis of first carpometacarpal joint, left hand: Secondary | ICD-10-CM | POA: Diagnosis not present

## 2021-12-25 DIAGNOSIS — M25531 Pain in right wrist: Secondary | ICD-10-CM | POA: Diagnosis not present

## 2021-12-25 DIAGNOSIS — G5603 Carpal tunnel syndrome, bilateral upper limbs: Secondary | ICD-10-CM | POA: Diagnosis not present

## 2022-01-07 ENCOUNTER — Telehealth: Payer: Self-pay | Admitting: Pharmacist

## 2022-01-07 DIAGNOSIS — F17200 Nicotine dependence, unspecified, uncomplicated: Secondary | ICD-10-CM

## 2022-01-07 NOTE — Telephone Encounter (Signed)
Attempted to contact patient for follow-up of tobacco intake reduction / cessation.  ? ? ?Left HIPAA compliant voice mail requesting call back to direct phone: [phone] ?Patient has appt at [facility] 4/11.  I will be out of the office at that time.  If patient interested in further tobacco cessation support. Please ask her to make another Rx Clinic appointment.  ? ? ?Total time with patient call and documentation of interaction: 9 minutes. ? ?Additional F/U Phone call planned: None planned at this time.  ? ?

## 2022-01-07 NOTE — Assessment & Plan Note (Signed)
Attempted to contact patient for follow-up of tobacco intake reduction / cessation.  ? ? ?Left HIPAA compliant voice mail requesting call back to direct phone: [phone] ?Patient has appt at [facility] 4/11.  I will be out of the office at that time.  If patient interested in further tobacco cessation support. Please ask her to make another Rx Clinic appointment.  ? ? ?Total time with patient call and documentation of interaction: 9 minutes. ? ?Additional F/U Phone call planned: None planned at this time.  ?

## 2022-01-07 NOTE — Telephone Encounter (Signed)
-----   Message from Leavy Cella, [place] sent at 12/19/2021  1:27 PM EST ----- ?Regarding: Progress. ? ? ?

## 2022-01-08 ENCOUNTER — Other Ambulatory Visit: Payer: Self-pay | Admitting: Cardiology

## 2022-01-24 ENCOUNTER — Ambulatory Visit: Payer: Medicare Other | Admitting: Internal Medicine

## 2022-01-28 ENCOUNTER — Ambulatory Visit: Payer: Medicare Other

## 2022-01-28 NOTE — Patient Instructions (Signed)
Visit Information ? ?Ms. Gertz  it was nice speaking with you. Please call me directly [phone] if you have questions about the goals we discussed. ? ?  ?Patient Goals/Self Care Activities: ?-Patient/Caregiver will self-administer medications as prescribed as evidenced by self-report/primary caregiver report  ?-Patient/Caregiver will attend all scheduled provider appointments as evidenced by clinician review of documented attendance to scheduled appointments and patient/caregiver report ?-Patient/Caregiver will call pharmacy for medication refills as evidenced by patient report and review of pharmacy fill history as appropriate ?-Patient/Caregiver will call provider office for new concerns or questions as evidenced by review of documented incoming telephone call notes and patient report ?-Patient/Caregiver verbalizes understanding of plan ?-Patient/Caregiver will focus on medication adherence by taking medications as prescribed ?-Calls provider office for new concerns, questions, or BP outside discussed parameters ?-Checks BP and records as discussed ?-Follows a low sodium diet/DASH diet ?  ?  ? ? ?Patient verbalizes understanding of instructions and care plan provided today and agrees to view in [place]. Active MyChart status confirmed with patient.   ? ?Follow up Plan: Patient would like continued follow-up.  CCM RNCM will outreach the patient within the next 5 weeks.  Patient will call office if needed prior to next encounter ? ?Lazaro Arms, RN ? ?[phone]  ?
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
## 2022-02-03 ENCOUNTER — Other Ambulatory Visit: Payer: Self-pay | Admitting: Physical Therapy

## 2022-02-03 ENCOUNTER — Telehealth: Payer: Self-pay | Admitting: *Deleted

## 2022-02-03 DIAGNOSIS — M5416 Radiculopathy, lumbar region: Secondary | ICD-10-CM

## 2022-02-03 NOTE — Telephone Encounter (Signed)
Pt called & would like an order placed for the same epidural she had in January 2023.  ?

## 2022-02-03 NOTE — Telephone Encounter (Signed)
L L4-5 ESI ordered to [facility] per pt request.  Pt may call [facility] at [phone] or [phone] to schedule. ?

## 2022-02-05 ENCOUNTER — Ambulatory Visit: Payer: Self-pay

## 2022-02-05 ENCOUNTER — Ambulatory Visit (INDEPENDENT_AMBULATORY_CARE_PROVIDER_SITE_OTHER): Payer: Medicare Other | Admitting: Family Medicine

## 2022-02-05 VITALS — BP 132/86 | HR 77 | Ht 62.0 in | Wt 258.6 lb

## 2022-02-05 DIAGNOSIS — E1169 Type 2 diabetes mellitus with other specified complication: Secondary | ICD-10-CM

## 2022-02-05 DIAGNOSIS — M25562 Pain in left knee: Secondary | ICD-10-CM

## 2022-02-05 DIAGNOSIS — M5416 Radiculopathy, lumbar region: Secondary | ICD-10-CM | POA: Diagnosis not present

## 2022-02-05 DIAGNOSIS — G8929 Other chronic pain: Secondary | ICD-10-CM | POA: Diagnosis not present

## 2022-02-05 NOTE — Patient Instructions (Addendum)
Thank you for coming in today.  ? ?Let's see how you feel after the epidural injection. ? ?If not improved, please return for a steroid injection in your knee. ? ? ?
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
## 2022-02-07 ENCOUNTER — Ambulatory Visit
Admission: RE | Admit: 2022-02-07 | Discharge: 2022-02-07 | Disposition: A | Discharge status: Home / Self Care | Payer: Medicare Other | Source: Ambulatory Visit | Attending: Family Medicine | Admitting: Family Medicine

## 2022-02-07 DIAGNOSIS — M5416 Radiculopathy, lumbar region: Secondary | ICD-10-CM

## 2022-02-07 DIAGNOSIS — M4727 Other spondylosis with radiculopathy, lumbosacral region: Secondary | ICD-10-CM | POA: Diagnosis not present

## 2022-02-07 MED ORDER — METHYLPREDNISOLONE ACETATE 40 MG/ML INJ SUSP (RADIOLOG
80.0000 mg | Freq: Once | INTRAMUSCULAR | Status: AC
  Administered 2022-02-07: 80 mg via EPIDURAL

## 2022-02-07 MED ORDER — IOPAMIDOL (ISOVUE-M 200) INJECTION 41%
1.0000 mL | Freq: Once | INTRAMUSCULAR | Status: AC
  Administered 2022-02-07: 1 mL via EPIDURAL

## 2022-02-07 NOTE — Discharge Instructions (Signed)
Post Procedure Spinal Discharge Instruction Sheet ? ?You may resume a regular diet and any medications that you routinely take (including pain medications) unless otherwise noted by MD. ? ?No driving day of procedure. ? ?Light activity throughout the rest of the day.  Do not do any strenuous work, exercise, bending or lifting.  The day following the procedure, you can resume normal physical activity but you should refrain from exercising or physical therapy for at least three days thereafter. ? ?You may apply ice to the injection site, 20 minutes on, 20 minutes off, as needed. Do not apply ice directly to skin.  ? ? ?Common Side Effects: ? ?Headaches- take your usual medications as directed by your physician.  Increase your fluid intake.  Caffeinated beverages may be helpful.  Lie flat in bed until your headache resolves. ? ?Restlessness or inability to sleep- you may have trouble sleeping for the next few days.  Ask your referring physician if you need any medication for sleep. ? ?Facial flushing or redness- should subside within a few days. ? ?Increased pain- a temporary increase in pain a day or two following your procedure is not unusual.  Take your pain medication as prescribed by your referring physician. ? ?Leg cramps ? ?Please contact our office at [phone] for the following symptoms: ?Fever greater than 100 degrees. ?Headaches unresolved with medication after 2-3 days. ?Increased swelling, pain, or redness at injection site. ? ? ?Thank you for visiting [facility] today.  ? ?MAY RESUME ASPIRIN IMMEDIATELY AFTER PROCEDURE!  ?

## 2022-02-17 DIAGNOSIS — G4733 Obstructive sleep apnea (adult) (pediatric): Secondary | ICD-10-CM | POA: Diagnosis not present

## 2022-02-24 ENCOUNTER — Ambulatory Visit (INDEPENDENT_AMBULATORY_CARE_PROVIDER_SITE_OTHER): Payer: Medicare Other | Admitting: Family Medicine

## 2022-02-24 ENCOUNTER — Encounter: Payer: Self-pay | Admitting: Family Medicine

## 2022-02-24 VITALS — BP 152/94 | HR 88 | Ht 62.0 in | Wt 260.0 lb

## 2022-02-24 DIAGNOSIS — R399 Unspecified symptoms and signs involving the genitourinary system: Secondary | ICD-10-CM

## 2022-02-24 DIAGNOSIS — I10 Essential (primary) hypertension: Secondary | ICD-10-CM

## 2022-02-24 LAB — POCT URINALYSIS DIP (MANUAL ENTRY)
Bilirubin, UA: NEGATIVE
Blood, UA: NEGATIVE
Glucose, UA: NEGATIVE mg/dL
Ketones, POC UA: NEGATIVE mg/dL
Nitrite, UA: POSITIVE — AB
Protein Ur, POC: NEGATIVE mg/dL
Spec Grav, UA: 1.015 (ref 1.010–1.025)
Urobilinogen, UA: 0.2 E.U./dL
pH, UA: 6.5 (ref 5.0–8.0)

## 2022-02-24 LAB — POCT UA - MICROSCOPIC ONLY

## 2022-02-24 NOTE — Patient Instructions (Signed)
We will going check a urine culture and if it shows positive bacteria I will call in an antibiotic for you.  If it is negative we will likely do a referral for urology.  I will let you know the results when they return.  If you develop any fevers, worsening symptoms or pain, or other concerns please let us know. ? ?For your blood pressure was elevated today.  I would like for you make a follow-up with me in 1 week and we will recheck your pressures.  Make sure and take your medications before that appointment. ? ?At her follow-up appointment we can discuss weight loss options and can see if we can come up with better medications to help you lose weight. ?

## 2022-02-24 NOTE — Progress Notes (Signed)
? ? ?  SUBJECTIVE:  ? ?CHIEF COMPLAINT / HPI:  ? ?Concern for UTI: ?61 year old female present for the above. She has had history of UTIs in the past with the most recent urine culture being clear but the one before this should show when E. coli there is multiple drug-resistant, however there are only 10,000 colonies of this but we did treat her due to her symptoms.  She was treated with fosfomycin 3 g every day for 3 doses.  We also discontinued her Vania Rea thinking that may be the cause with possibility of interstitial cystitis. She states she had pain and urinary frequency starting about 2 weeks ago. No fevers. She notes the main symptoms is dysuria.  ? ?Discuss weight loss: ?Patient states that she would like to have an appointment sometime to discuss options for weight loss that she thinks she will need some knee replacements in the future.  She is currently on Victoza for diabetes and I discussed that there may be other options to consider for this.  She is going to follow-up appointment in a week to discuss this. ? ?PERTINENT  PMH / PSH: Type 2 diabetes ? ?OBJECTIVE:  ? ?BP (!) 154/97   Pulse 88   Ht 5' 2"  (1.575 m)   Wt 260 lb (117.9 kg)   LMP 04/06/2017   SpO2 96%   BMI 47.55 kg/m?   ? ?General: NAD, pleasant, able to participate in exam ?Cardiac: RRR, no murmurs. ?Respiratory: CTAB, normal effort, No wheezes, rales or rhonchi ?Abdomen: Suprapubic discomfort mildly present. ?Neuro: alert, no obvious focal deficits ?Psych: Normal affect and mood ? ?ASSESSMENT/PLAN:  ? ? ?Dysuria: ?Patient with a history of drug-resistant UTI previously treated with fosfomycin.  She endorses 2 weeks of dysuria and urinary frequency.  Urinalysis with cloudy urine, trace leukocytes, positive nitrite.  Discussed with her that with her history I would like to wait for the urine culture as we have limited medications to treat her UTIs and there is still possibility of interstitial cystitis that with positive nitrite I think  it is more likely this is going to be an infection.  She is interested in waiting the urine culture before we initiate treatment.  We will await this.  She is not having any fevers or other systemic signs.  Discussed return precautions. ? ?Hypertension: ?Blood pressure elevated 154/97, similar on recheck.  She did not take her blood pressure medications this morning.  Recommended following up in 1 week to check her pressures after she takes her medication. ? ?Discuss weight loss: ?Patient states that she would like to have an appointment sometime to discuss options for weight loss that she thinks she will need some knee replacements in the future.  She is currently on Victoza for diabetes and I discussed that there may be other options to consider for this.  She is going to follow-up appointment in a week to discuss this. ? ?Lurline Del, DO ?[place]  ? ? ? ?

## 2022-02-25 NOTE — Progress Notes (Deleted)
    SUBJECTIVE:   CHIEF COMPLAINT / HPI:   Hypertension: BP elevated at appointment last week at 154/97. She had not taken her blood pressure meds that morning and so we decided to follow up this week to check her pressure after taking her meds. She states ***  Discuss weight loss: Patient is interested in trying to lose weight. She expects to have a knee replacement surgery in the near future and inquires about medications and options for weight loss. ***  PERTINENT  PMH / PSH: ***  OBJECTIVE:   LMP 04/06/2017  ***  General: NAD, pleasant, able to participate in exam Cardiac: RRR, no murmurs. Respiratory: CTAB, normal effort, No wheezes, rales or rhonchi Abdomen: Bowel sounds present, nontender, nondistended, no hepatosplenomegaly. Extremities: no edema or cyanosis. Skin: warm and dry, no rashes noted Neuro: alert, no obvious focal deficits Psych: Normal affect and mood  ASSESSMENT/PLAN:   No problem-specific Assessment & Plan notes found for this encounter.   BP today of ***   Lurline Del, DO [place]    {    This will disappear when note is signed, click to select method of visit    :1}

## 2022-02-27 ENCOUNTER — Other Ambulatory Visit: Payer: Self-pay | Admitting: Family Medicine

## 2022-02-27 ENCOUNTER — Ambulatory Visit: Payer: Medicare Other

## 2022-02-27 ENCOUNTER — Encounter: Payer: Self-pay | Admitting: Family Medicine

## 2022-02-27 DIAGNOSIS — I10 Essential (primary) hypertension: Secondary | ICD-10-CM

## 2022-02-27 DIAGNOSIS — I5032 Chronic diastolic (congestive) heart failure: Secondary | ICD-10-CM

## 2022-02-27 DIAGNOSIS — Z789 Other specified health status: Secondary | ICD-10-CM

## 2022-02-27 LAB — URINE CULTURE

## 2022-02-27 MED ORDER — AMOXICILLIN-POT CLAVULANATE 500-125 MG PO TABS: 1.0000 | ORAL_TABLET | Freq: Two times a day (BID) | ORAL | 0 refills | Status: AC

## 2022-02-27 MED ORDER — FLUCONAZOLE 150 MG PO TABS: 150.0000 mg | ORAL_TABLET | Freq: Once | ORAL | 0 refills | Status: AC

## 2022-02-27 NOTE — Patient Instructions (Signed)
Visit Information ? ?Ms. Tregre  it was nice speaking with you. Please call me directly [phone] if you have questions about the goals we discussed. ? ?  ?Patient Goals/Self Care Activities: ?-Patient/Caregiver will self-administer medications as prescribed as evidenced by self-report/primary caregiver report  ?-Patient/Caregiver will attend all scheduled provider appointments as evidenced by clinician review of documented attendance to scheduled appointments and patient/caregiver report ?-Patient/Caregiver will call pharmacy for medication refills as evidenced by patient report and review of pharmacy fill history as appropriate ?-Patient/Caregiver will call provider office for new concerns or questions as evidenced by review of documented incoming telephone call notes and patient report ?-Patient/Caregiver verbalizes understanding of plan ?-Patient/Caregiver will focus on medication adherence by taking medications as prescribed ?-Calls provider office for new concerns, questions, or BP outside discussed parameters ?-Checks BP and records as discussed ?-Follows a low sodium diet/DASH diet ?  ? ? ?Patient verbalizes understanding of instructions and care plan provided today and agrees to view in [place]. Active MyChart status confirmed with patient.   ? ?Follow up Plan: Patient would like continued follow-up.  CCM RNCM will outreach the patient within the next 6 weeks  Patient will call office if needed prior to next encounter ? ?Lazaro Arms, RN ? ?[phone]  ?

## 2022-02-27 NOTE — Chronic Care Management (AMB) (Signed)
?Chronic Care Management  ? ?CCM RN Visit Note ? ?02/27/2022 ?Name: Hailey Barnes MRN: [id number] DOB: 02-28-1961 ? ?Subjective: ?Hailey Barnes is a 61 y.o. year old female who is a primary care patient of Lurline Del, DO. The care management team was consulted for assistance with disease management and care coordination needs.   ? ?Engaged with patient by telephone for follow up visit in response to provider referral for case management and/or care coordination services.  ? ?Consent to Services:  ?The patient was given information about Chronic Care Management services, agreed to services, and gave verbal consent prior to initiation of services.  Please see initial visit note for detailed documentation.  ? ?Patient agreed to services and verbal consent obtained.  ? ? ? ?Summary: The patient has been living in her apartment for eight years. However, her landlord recently sold the property to a new company that does not accept Section 8. Consequently, the patient received a 30-day notice to vacate the premises, which caused her to worry about finding a new place to live. Her blood pressure was elevated at 159/115. She is adhering to her medication and diet regimen but attributes the elevation to pain and worriation. . See Care Plan below for interventions and patient self-care actives. ? ?Recommendation: The patient may benefit from taking medications as prescribed, monitoring food intake, checking blood pressures and record values, calling your physician if numbers are abnormal, and The patient agrees. ? ?Follow up Plan: Patient would like continued follow-up.  CCM RNCM will outreach the patient within the next 6 weeks.  Patient will call office if needed prior to next encounter ? ? ?Assessment: Review of patient past medical history, allergies, medications, health status, including review of consultants reports, laboratory and other test data, was performed as part of comprehensive evaluation and  provision of chronic care management services.  ? ?SDOH (Social Determinants of Health) assessments and interventions performed:  ?SDOH Interventions   ? ?Flowsheet Row Most Recent Value  ?SDOH Interventions   ?Housing Interventions Other (Comment)  [Sending a referral to the care guides]  ? ?  ?  ? ?CCM Care Plan ? ? ? ?Conditions to be addressed/monitored:HTN ? ?Care Plan : RN Case Manager  ?Updates made by Lazaro Arms, RN since 02/27/2022 12:00 AM  ?  ? ?Problem: Hypertension (Hypertension)   ?Priority: High  ?Onset Date: 04/02/2020  ?Note:   ?Current Barriers:  ?Knowledge Deficits related to basic understanding of hypertension pathophysiology and self care management ?Housing ? ? ? ?Nurse Case Manager Clinical Goal(s):  ?Over the next 30 days, patient will demonstrate improved adherence to prescribed treatment plan for hypertension as evidenced by taking all medications as prescribed, monitoring and recording blood pressure as directed, adhering to low sodium/DASH diet ?The patient will work with the care guides to help with finding  housing ? ?Interventions: ?1:1 collaboration with primary care provider regarding development and update of comprehensive plan of care as evidenced by provider attestation and co-signature ?Inter-disciplinary care team collaboration (see longitudinal plan of care) ?Evaluation of current treatment plan related to  self management and patient's adherence to plan as established by provider ? ? ?Hypertension: (Status: Goal on Track (progressing): YES.) Long Term ?Last practice recorded BP readings:  ?BP Readings from Last 3 Encounters:  ?02/24/22 (!) 152/94  ?02/07/22 (!) 148/94  ?02/05/22 132/86  ?Most recent eGFR/CrCl:  ?Lab Results  ?Component Value Date  ? EGFR 58 (L) 06/20/2021  ?  No components found for: CRCL ?  ?  Evaluation of current treatment plan related to hypertension self management and patient's adherence to plan as established by provider. ?Discussed plans with patient for  ongoing care management follow up and provided patient with direct contact information for care management team ?healthy diet promoted ?medication adherence assessment completed- ?support and encouragement provided ?02/27/22: Today, the patient's condition is fair. However, she mentioned that she still experiences UTIs and back pain. She reported no pain today, but her blood pressure was elevated at 159/115. She is adhering to her medication and diet regimen but attributes the elevation to pain and worriation. A follow-up appointment with Dr. Vanessa Haralson is scheduled at [facility] on 03/05/22. ?The patient has been living in her apartment for eight years. However, her landlord recently sold the property to a new company that does not accept Section 8. Consequently, the patient received a 30-day notice to vacate the premises, which caused her to worry about finding a new place to live. She kindly asks for any helpful information on potential housing options. Sadly, this situation has raised her blood pressure. ? ?Patient Goals/Self Care Activities: ?-Patient/Caregiver will self-administer medications as prescribed as evidenced by self-report/primary caregiver report  ?-Patient/Caregiver will attend all scheduled provider appointments as evidenced by clinician review of documented attendance to scheduled appointments and patient/caregiver report ?-Patient/Caregiver will call pharmacy for medication refills as evidenced by patient report and review of pharmacy fill history as appropriate ?-Patient/Caregiver will call provider office for new concerns or questions as evidenced by review of documented incoming telephone call notes and patient report ?-Patient/Caregiver verbalizes understanding of plan ?-Patient/Caregiver will focus on medication adherence by taking medications as prescribed ?-Calls provider office for new concerns, questions, or BP outside discussed parameters ?-Checks BP and records as discussed ?-Follows a low  sodium diet/DASH diet ? ?  ? ? ? ?Lazaro Arms RN, BSN, [place] ?Care Management Coordinator ?[place]  ?Phone: [phone]  ?  ? ? ? ? ? ? ? ? ? ?

## 2022-02-27 NOTE — Progress Notes (Signed)
Called patient to discuss her urine culture.  Urine culture is positive.  She does have significant levels of resistant.  Fortunately this time she is sensitive to Augmentin.  Discussed this with her over the phone.  We will send in this to treat.  We will also send in Diflucan as she states she often gets yeast infections after antibiotics for UTIs.  Recommended that if she has any additional urinary tract infection in the next 6 months to a year I recommend getting her in with urology. ?

## 2022-02-28 DIAGNOSIS — Z1231 Encounter for screening mammogram for malignant neoplasm of breast: Secondary | ICD-10-CM | POA: Diagnosis not present

## 2022-03-02 ENCOUNTER — Encounter: Payer: Self-pay | Admitting: Family Medicine

## 2022-03-05 ENCOUNTER — Ambulatory Visit: Payer: Medicare Other | Admitting: Family Medicine

## 2022-03-06 ENCOUNTER — Telehealth: Payer: Self-pay | Admitting: *Deleted

## 2022-03-06 NOTE — Telephone Encounter (Signed)
   Telephone encounter was:  Successful.  03/06/2022 Name: Jalyn Dutta MRN: [id number] DOB: 1960/11/23  Hailey Barnes is a 61 y.o. year old female who is a primary care patient of Lurline Del, DO . The community resource team was consulted for assistance with  Housing  has to be out by the 31st of may and unsure if the apartment if will owner house will be ready ,  Section 8 inspection is needed . was mailed information  [email or web address]  Care guide performed the following interventions: Patient provided with information about care guide support team and interviewed to confirm resource needs.  Follow Up Plan:  No further follow up planned at this time. The patient has been provided with needed resources.  [place], Care Management  [phone] [address] , [place] [address] Email : [email or web address]

## 2022-03-16 ENCOUNTER — Other Ambulatory Visit: Payer: Self-pay

## 2022-03-16 ENCOUNTER — Inpatient Hospital Stay (HOSPITAL_BASED_OUTPATIENT_CLINIC_OR_DEPARTMENT_OTHER)
Admission: EM | Admit: 2022-03-16 | Discharge: 2022-03-18 | DRG: 690 | Disposition: A | Discharge status: Home / Self Care | Payer: Medicare Other | Attending: Internal Medicine | Admitting: Internal Medicine

## 2022-03-16 ENCOUNTER — Inpatient Hospital Stay (HOSPITAL_COMMUNITY): Payer: Medicare Other

## 2022-03-16 ENCOUNTER — Encounter (HOSPITAL_BASED_OUTPATIENT_CLINIC_OR_DEPARTMENT_OTHER): Payer: Self-pay | Admitting: Emergency Medicine

## 2022-03-16 DIAGNOSIS — Z7982 Long term (current) use of aspirin: Secondary | ICD-10-CM | POA: Diagnosis not present

## 2022-03-16 DIAGNOSIS — E785 Hyperlipidemia, unspecified: Secondary | ICD-10-CM | POA: Diagnosis not present

## 2022-03-16 DIAGNOSIS — B9629 Other Escherichia coli [E. coli] as the cause of diseases classified elsewhere: Secondary | ICD-10-CM | POA: Diagnosis present

## 2022-03-16 DIAGNOSIS — Z825 Family history of asthma and other chronic lower respiratory diseases: Secondary | ICD-10-CM

## 2022-03-16 DIAGNOSIS — K5730 Diverticulosis of large intestine without perforation or abscess without bleeding: Secondary | ICD-10-CM | POA: Diagnosis not present

## 2022-03-16 DIAGNOSIS — F17200 Nicotine dependence, unspecified, uncomplicated: Secondary | ICD-10-CM | POA: Diagnosis present

## 2022-03-16 DIAGNOSIS — J449 Chronic obstructive pulmonary disease, unspecified: Secondary | ICD-10-CM | POA: Diagnosis present

## 2022-03-16 DIAGNOSIS — I10 Essential (primary) hypertension: Secondary | ICD-10-CM | POA: Diagnosis not present

## 2022-03-16 DIAGNOSIS — E119 Type 2 diabetes mellitus without complications: Secondary | ICD-10-CM | POA: Diagnosis present

## 2022-03-16 DIAGNOSIS — Z7951 Long term (current) use of inhaled steroids: Secondary | ICD-10-CM

## 2022-03-16 DIAGNOSIS — Z6841 Body Mass Index (BMI) 40.0 and over, adult: Secondary | ICD-10-CM

## 2022-03-16 DIAGNOSIS — Z79899 Other long term (current) drug therapy: Secondary | ICD-10-CM

## 2022-03-16 DIAGNOSIS — Z1612 Extended spectrum beta lactamase (ESBL) resistance: Secondary | ICD-10-CM | POA: Diagnosis not present

## 2022-03-16 DIAGNOSIS — I5032 Chronic diastolic (congestive) heart failure: Secondary | ICD-10-CM | POA: Diagnosis not present

## 2022-03-16 DIAGNOSIS — Z8249 Family history of ischemic heart disease and other diseases of the circulatory system: Secondary | ICD-10-CM | POA: Diagnosis not present

## 2022-03-16 DIAGNOSIS — K7581 Nonalcoholic steatohepatitis (NASH): Secondary | ICD-10-CM | POA: Diagnosis not present

## 2022-03-16 DIAGNOSIS — Z87440 Personal history of urinary (tract) infections: Secondary | ICD-10-CM | POA: Diagnosis not present

## 2022-03-16 DIAGNOSIS — B9620 Unspecified Escherichia coli [E. coli] as the cause of diseases classified elsewhere: Secondary | ICD-10-CM | POA: Diagnosis present

## 2022-03-16 DIAGNOSIS — F17210 Nicotine dependence, cigarettes, uncomplicated: Secondary | ICD-10-CM | POA: Diagnosis not present

## 2022-03-16 DIAGNOSIS — Z888 Allergy status to other drugs, medicaments and biological substances status: Secondary | ICD-10-CM

## 2022-03-16 DIAGNOSIS — D3502 Benign neoplasm of left adrenal gland: Secondary | ICD-10-CM | POA: Diagnosis not present

## 2022-03-16 DIAGNOSIS — J984 Other disorders of lung: Secondary | ICD-10-CM

## 2022-03-16 DIAGNOSIS — Z8673 Personal history of transient ischemic attack (TIA), and cerebral infarction without residual deficits: Secondary | ICD-10-CM

## 2022-03-16 DIAGNOSIS — N390 Urinary tract infection, site not specified: Secondary | ICD-10-CM | POA: Diagnosis not present

## 2022-03-16 DIAGNOSIS — G4733 Obstructive sleep apnea (adult) (pediatric): Secondary | ICD-10-CM | POA: Diagnosis not present

## 2022-03-16 DIAGNOSIS — I5030 Unspecified diastolic (congestive) heart failure: Secondary | ICD-10-CM | POA: Diagnosis present

## 2022-03-16 DIAGNOSIS — I110 Hypertensive heart disease with heart failure: Secondary | ICD-10-CM | POA: Diagnosis not present

## 2022-03-16 LAB — CBC
HCT: 48.4 % — ABNORMAL HIGH (ref 36.0–46.0)
Hemoglobin: 15.9 g/dL — ABNORMAL HIGH (ref 12.0–15.0)
MCH: 28.5 pg (ref 26.0–34.0)
MCHC: 32.9 g/dL (ref 30.0–36.0)
MCV: 86.7 fL (ref 80.0–100.0)
Platelets: 209 10*3/uL (ref 150–400)
RBC: 5.58 MIL/uL — ABNORMAL HIGH (ref 3.87–5.11)
RDW: 15.3 % (ref 11.5–15.5)
WBC: 12.7 10*3/uL — ABNORMAL HIGH (ref 4.0–10.5)
nRBC: 0 % (ref 0.0–0.2)

## 2022-03-16 LAB — BASIC METABOLIC PANEL
Anion gap: 8 (ref 5–15)
BUN: 11 mg/dL (ref 6–20)
CO2: 25 mmol/L (ref 22–32)
Calcium: 9.3 mg/dL (ref 8.9–10.3)
Chloride: 105 mmol/L (ref 98–111)
Creatinine, Ser: 0.89 mg/dL (ref 0.44–1.00)
GFR, Estimated: >60 mL/min (ref >60)
Glucose, Bld: 103 mg/dL — ABNORMAL HIGH (ref 70–99)
Potassium: 3.5 mmol/L (ref 3.5–5.1)
Sodium: 138 mmol/L (ref 135–145)

## 2022-03-16 LAB — URINALYSIS, ROUTINE W REFLEX MICROSCOPIC
Bilirubin Urine: NEGATIVE
Glucose, UA: NEGATIVE mg/dL
Ketones, ur: NEGATIVE mg/dL
Nitrite: NEGATIVE
Protein, ur: NEGATIVE mg/dL
Specific Gravity, Urine: 1.012 (ref 1.005–1.030)
WBC, UA: >50 WBC/hpf — ABNORMAL HIGH (ref 0–5)
pH: 6 (ref 5.0–8.0)

## 2022-03-16 LAB — HEMOGLOBIN A1C
Hgb A1c MFr Bld: 6.3 % — ABNORMAL HIGH (ref 4.8–5.6)
Mean Plasma Glucose: 134.11 mg/dL

## 2022-03-16 LAB — GLUCOSE, CAPILLARY
Glucose-Capillary: 97 mg/dL (ref 70–99)
Glucose-Capillary: 94 mg/dL (ref 70–99)

## 2022-03-16 LAB — TROPONIN I (HIGH SENSITIVITY)
Troponin I (High Sensitivity): 4 ng/L (ref <18)
Troponin I (High Sensitivity): 3 ng/L (ref <18)

## 2022-03-16 MED ORDER — ASPIRIN 81 MG PO TBEC
81.0000 mg | DELAYED_RELEASE_TABLET | Freq: Every day | ORAL | Status: DC
  Administered 2022-03-17 – 2022-03-18 (×2): 81 mg via ORAL
  Filled 2022-03-16 (×2): qty 1

## 2022-03-16 MED ORDER — HYDRALAZINE HCL 25 MG PO TABS
25.0000 mg | ORAL_TABLET | Freq: Three times a day (TID) | ORAL | Status: DC
  Administered 2022-03-16 – 2022-03-17 (×2): 25 mg via ORAL
  Filled 2022-03-16 (×2): qty 1

## 2022-03-16 MED ORDER — ONDANSETRON 4 MG PO TBDP
4.0000 mg | ORAL_TABLET | Freq: Once | ORAL | Status: AC
  Administered 2022-03-16: 4 mg via ORAL
  Filled 2022-03-16: qty 1

## 2022-03-16 MED ORDER — SODIUM CHLORIDE 0.9 % IV SOLN
500.0000 mg | Freq: Once | INTRAVENOUS | Status: AC
  Administered 2022-03-16: 500 mg via INTRAVENOUS
  Filled 2022-03-16: qty 500

## 2022-03-16 MED ORDER — OXYCODONE-ACETAMINOPHEN 5-325 MG PO TABS
1.0000 | ORAL_TABLET | Freq: Once | ORAL | Status: AC
  Administered 2022-03-16: 1 via ORAL
  Filled 2022-03-16: qty 1

## 2022-03-16 MED ORDER — AMLODIPINE BESYLATE 5 MG PO TABS
2.5000 mg | ORAL_TABLET | Freq: Every day | ORAL | Status: DC
  Administered 2022-03-16 – 2022-03-17 (×2): 2.5 mg via ORAL
  Filled 2022-03-16 (×2): qty 1

## 2022-03-16 MED ORDER — INSULIN ASPART 100 UNIT/ML IJ SOLN: 0.0000 [IU] | Freq: Every day | INTRAMUSCULAR | Status: DC

## 2022-03-16 MED ORDER — ALBUTEROL SULFATE (2.5 MG/3ML) 0.083% IN NEBU: 2.5000 mg | INHALATION_SOLUTION | Freq: Four times a day (QID) | RESPIRATORY_TRACT | Status: DC | PRN

## 2022-03-16 MED ORDER — ATORVASTATIN CALCIUM 40 MG PO TABS
40.0000 mg | ORAL_TABLET | Freq: Every day | ORAL | Status: DC
  Administered 2022-03-17 – 2022-03-18 (×2): 40 mg via ORAL
  Filled 2022-03-16 (×2): qty 1

## 2022-03-16 MED ORDER — FLUTICASONE FUROATE-VILANTEROL 200-25 MCG/ACT IN AEPB
1.0000 | INHALATION_SPRAY | Freq: Every day | RESPIRATORY_TRACT | Status: DC
  Administered 2022-03-17 – 2022-03-18 (×2): 1 via RESPIRATORY_TRACT
  Filled 2022-03-16: qty 28

## 2022-03-16 MED ORDER — ENOXAPARIN SODIUM 60 MG/0.6ML IJ SOSY
60.0000 mg | PREFILLED_SYRINGE | INTRAMUSCULAR | Status: DC
  Administered 2022-03-16 – 2022-03-17 (×2): 60 mg via SUBCUTANEOUS
  Filled 2022-03-16 (×2): qty 0.6

## 2022-03-16 MED ORDER — ACETAMINOPHEN 650 MG RE SUPP: 650.0000 mg | Freq: Four times a day (QID) | RECTAL | Status: DC | PRN

## 2022-03-16 MED ORDER — ACETAMINOPHEN 325 MG PO TABS
650.0000 mg | ORAL_TABLET | Freq: Four times a day (QID) | ORAL | Status: DC | PRN
Start: 2022-03-16 — End: 2022-03-18
  Administered 2022-03-16: 650 mg via ORAL
  Filled 2022-03-16: qty 2

## 2022-03-16 MED ORDER — NICOTINE 21 MG/24HR TD PT24
21.0000 mg | MEDICATED_PATCH | Freq: Every day | TRANSDERMAL | Status: DC
  Administered 2022-03-16: 21 mg via TRANSDERMAL
  Filled 2022-03-16: qty 1

## 2022-03-16 MED ORDER — FLUCONAZOLE 150 MG PO TABS
150.0000 mg | ORAL_TABLET | Freq: Once | ORAL | Status: AC
Start: 2022-03-16 — End: 2022-03-16
  Administered 2022-03-16: 150 mg via ORAL
  Filled 2022-03-16: qty 1

## 2022-03-16 MED ORDER — NICOTINE 21 MG/24HR TD PT24
21.0000 mg | MEDICATED_PATCH | TRANSDERMAL | Status: DC
  Administered 2022-03-16: 21 mg via TRANSDERMAL
  Filled 2022-03-16: qty 1

## 2022-03-16 MED ORDER — POLYVINYL ALCOHOL-POVIDONE 5-6 MG/ML OP SOLN
Freq: Every day | OPHTHALMIC | Status: DC | PRN
Start: 2022-03-16 — End: 2022-03-16

## 2022-03-16 MED ORDER — UMECLIDINIUM BROMIDE 62.5 MCG/ACT IN AEPB
1.0000 | INHALATION_SPRAY | Freq: Every day | RESPIRATORY_TRACT | Status: DC
  Administered 2022-03-17 – 2022-03-18 (×2): 1 via RESPIRATORY_TRACT
  Filled 2022-03-16: qty 7

## 2022-03-16 MED ORDER — MORPHINE SULFATE (PF) 2 MG/ML IV SOLN
2.0000 mg | INTRAVENOUS | Status: DC | PRN
  Administered 2022-03-16 – 2022-03-17 (×2): 2 mg via INTRAVENOUS
  Filled 2022-03-16 (×2): qty 1

## 2022-03-16 MED ORDER — SODIUM CHLORIDE 0.9 % IV SOLN
500.0000 mg | Freq: Three times a day (TID) | INTRAVENOUS | Status: DC
  Administered 2022-03-16: 500 mg via INTRAVENOUS
  Filled 2022-03-16 (×2): qty 10

## 2022-03-16 MED ORDER — ONDANSETRON HCL 4 MG PO TABS: 4.0000 mg | ORAL_TABLET | Freq: Four times a day (QID) | ORAL | Status: DC | PRN

## 2022-03-16 MED ORDER — ONDANSETRON HCL 4 MG/2ML IJ SOLN: 4.0000 mg | Freq: Four times a day (QID) | INTRAMUSCULAR | Status: DC | PRN

## 2022-03-16 MED ORDER — SENNOSIDES-DOCUSATE SODIUM 8.6-50 MG PO TABS
1.0000 | ORAL_TABLET | Freq: Every evening | ORAL | Status: DC | PRN
  Administered 2022-03-17: 1 via ORAL
  Filled 2022-03-16 (×2): qty 1

## 2022-03-16 MED ORDER — INSULIN ASPART 100 UNIT/ML IJ SOLN: 0.0000 [IU] | Freq: Three times a day (TID) | INTRAMUSCULAR | Status: DC

## 2022-03-16 MED ORDER — DIPHENHYDRAMINE HCL 25 MG PO CAPS
25.0000 mg | ORAL_CAPSULE | Freq: Four times a day (QID) | ORAL | Status: DC | PRN
  Administered 2022-03-16 – 2022-03-17 (×2): 25 mg via ORAL
  Filled 2022-03-16 (×2): qty 1

## 2022-03-16 MED ORDER — SODIUM CHLORIDE 0.9 % IV SOLN
1.0000 g | Freq: Three times a day (TID) | INTRAVENOUS | Status: DC
  Administered 2022-03-16 – 2022-03-18 (×5): 1 g via INTRAVENOUS
  Filled 2022-03-16: qty 1
  Filled 2022-03-16: qty 20
  Filled 2022-03-16: qty 1
  Filled 2022-03-16 (×3): qty 20

## 2022-03-16 MED ORDER — ALBUTEROL SULFATE HFA 108 (90 BASE) MCG/ACT IN AERS: 1.0000 | INHALATION_SPRAY | Freq: Four times a day (QID) | RESPIRATORY_TRACT | Status: DC | PRN

## 2022-03-16 MED ORDER — NAPHAZOLINE-GLYCERIN 0.012-0.25 % OP SOLN
1.0000 [drp] | Freq: Every day | OPHTHALMIC | Status: DC | PRN
  Filled 2022-03-16: qty 15

## 2022-03-16 NOTE — Plan of Care (Signed)
61 year old F with PMH of NASH cirrhosis, diastolic CHF, restrictive lung disease, COPD, OSA, DM-2, morbid obesity, HTN, osteoarthritis, lumbar radiculopathy and recent ESBL UTI for which she was treated with Augmentin presenting with dysuria, frequency and urgency.  Seen by PCP on 5/8.  Urine culture with ESBL E. coli but sensitive to Augmentin and Zosyn.  She was treated with p.o. Augmentin with interval improvement in her symptoms.  In ED, hypertensive to 173/107.  Other vitals stable.  WBC 12.7.  BMP without significant finding.  UA with pyuria and bacteria.  Urine culture obtained.  ED PA discussed with infectious disease, Dr. Tommy Medal and started meropenem.  Accepted admission to [facility] telemetry bed for possible ESBL UTI.

## 2022-03-16 NOTE — ED Triage Notes (Signed)
Urinary frequency and urgency. Recently treated and completed antibiotics for UTI and symptoms returned 3-4 days ago.

## 2022-03-16 NOTE — H&P (Addendum)
History and Physical    Hailey Barnes VWU:[id number] DOB: Dec 03, 1960 DOA: 03/16/2022  I have briefly reviewed the patient's prior medical records in [place]  PCP: Lurline Del, DO  Patient coming from: home via [facility]  Chief Complaint: lower abdominal pain, burning with urination, increased frequency  HPI: Hailey Barnes is a 61 y.o. female with medical history significant of type 2 diabetes mellitus, COPD, DM 2, ongoing tobacco use, obesity, hypertension, hyperlipidemia, OSA on CPAP who comes to the hospital with complaints of lower abdominal pain, nausea, burning with urination, increased frequency.  Patient tells me she has been having the symptoms, on and off for the past 6 months.  Most recently, at the beginning of this month, she was seen by PCP for similar symptoms.  Urinalysis at that time grew ESBL E. Coli, and it appears that she was treated Augmentin for this as in vitro it appeared to be sensitive.  She felt better initially but then symptoms recurred.  She denies any fever or chills.  She denies any flank pain or back pain.  She reports some chest pain that started earlier today.  She presented to [facility] ED  ED Course: In the ED she is afebrile, blood pressure is on the high side, satting well on room air.  Blood work reveals leukocytosis with a white count of 12.7, urinalysis has many bacteria, pyuria with large leukocytes.  EDP discussed with Dr. Drucilla Schmidt who recommended Carbapenem treatment and she was admitted to the hospital.  Review of Systems: All systems reviewed, and apart from HPI, all negative  Past Medical History:  Diagnosis Date   Arthritis    "knees" (02/21/2014), back   CHF (congestive heart failure) ([place])    Chronic bronchitis ([place])    "get it q yr" (02/21/2014)   Chronic diastolic CHF (congestive heart failure) ([place])    a. Echo 10/23/2016: EF 75 %   Chronic lower back pain    Complication of anesthesia    "I don't come out well; I  chew on my tongue"   COPD (chronic obstructive pulmonary disease) ([place])    Diabetes mellitus without complication ([place])    TYPE 2   Family history of adverse reaction to anesthesia    BROTHER PONV, had a blood clot several days later and heart stopped   Fatty liver    NONALCOLIC   Fibroid    Heart murmur    Hypertension    Infection of right eye    current dx on Saturday 7/15 - using drops   Leg swelling    bilateral   Migraine 1982-2009   MVP (mitral valve prolapse)    Neuromuscular disorder (HCC)    right leg nerve pain   Shortness of breath    WITH EXERTION   Sinus pause    a. noted on telemetry during admission from 10/2016, lasting up to 4.4 seconds. BB discontinued.    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   D/Dx: carpal tunnel, ulnar neuropathy Less likely: cervical radiculopathy, vasculitis     Past Surgical History:  Procedure Laterality Date   CESAREAN SECTION  1982   CHOLECYSTECTOMY N/A 06/17/2017   Procedure:  LAPAROSCOPIC CHOLECYSTECTOMY WITH INTRAOPERATIVE CHOLANGIOGRAM;  Surgeon: Donnie Mesa, MD;  Location: [place];  Service: General;  Laterality: N/A;   COLONOSCOPY WITH PROPOFOL N/A 04/28/2017   Procedure: COLONOSCOPY WITH PROPOFOL;  Surgeon: Manus Gunning, MD;  Location: [facility];  Service: Gastroenterology;  Laterality: N/A;   DILATATION & CURETTAGE/HYSTEROSCOPY WITH MYOSURE N/A 05/06/2016   Procedure: DILATATION & CURETTAGE/HYSTEROSCOPY WITH MYOSURE;  Surgeon: Terrance Mass, MD;  Location: [facility];  Service: Gynecology;  Laterality: N/A;  request to follow around 8:45  requests one hour OR time   [place] N/A 04/03/2017   Procedure:  [place];  Surgeon: Terrance Mass, MD;  Location: [place] [facility];  Service: Gynecology;  Laterality: N/A;   DILATION AND CURETTAGE OF UTERUS     ESOPHAGOGASTRODUODENOSCOPY (EGD) WITH PROPOFOL N/A 04/28/2017   Procedure: ESOPHAGOGASTRODUODENOSCOPY (EGD) WITH PROPOFOL;  Surgeon: Manus Gunning, MD;  Location: [facility];  Service: Gastroenterology;  Laterality: N/A;   FOOT SURGERY Bilateral    "took bones out; put pins in"   IR ABLATE LIVER CRYOABLATION  02/08/2020   IR RADIOLOGIST EVAL & MGMT  02/03/2020   KNEE ARTHROSCOPY Left 08/16/2018   Procedure: ARTHROSCOPY KNEE PARTIAL MEDIAL MENISECTOMY, CHONDROPLASTY MEDIAL AND LATERAL, PLICA RELEASE MEDIAL;  Surgeon: Dorna Leitz, MD;  Location: [place];  Service: Orthopedics;  Laterality: Left;   LEFT AND RIGHT HEART CATHETERIZATION WITH CORONARY ANGIOGRAM N/A 03/31/2014   Procedure: LEFT AND RIGHT HEART CATHETERIZATION WITH CORONARY ANGIOGRAM;  Surgeon: Birdie Riddle, MD;  Location: [facility] [facility];  Service: Cardiovascular;  Laterality: N/A;   MYOMECTOMY  YRS AGO   sonohystogram  04/14/2016   [facility]: intramural fibroid, premenopausal endometrium, right fluid filled tubular structure     reports that she has been smoking cigarettes. She started smoking about 45 years ago. She has a 21.00 pack-year smoking history. She has never used smokeless tobacco. She reports current alcohol use. She reports current drug use. Drugs: "Crack" cocaine and Marijuana.  Allergies  Allergen Reactions   Bupropion Other (See Comments)    Throat soreness and difficulty swallowing.    Norvasc [Amlodipine] Swelling    Significant leg swelling with 9m. Able to tolerate 2.565m     Family History  Problem Relation Age of Onset   Hypertension Mother    COPD Mother    Hypertension Father    Cancer Sister        UTERINE???   COPD Sister    Hypertension Sister    Hypertension Sister     Prior to  Admission medications   Medication Sig Start Date End Date Taking? Authorizing Provider  acetaminophen (TYLENOL) 500 MG tablet Take 1,000 mg by mouth every 6 (six) hours as needed for moderate pain or headache.    [provider]  albuterol (VENTOLIN HFA) 108 (90 Base) MCG/ACT inhaler INHALE 1-2 PUFFS BY MOUTH EVERY 6 HOURS AS NEEDED FOR WHEEZE OR SHORTNESS OF BREATH 08/07/21   WeLurline DelDO  allopurinol (ZYLOPRIM) 300 MG tablet Take 1 tablet (300 mg total) by mouth daily. 10/23/21   JaGerrit HeckMD  amLODipine (NORVASC) 2.5 MG tablet Take 1 tablet (2.5 mg total) by mouth at bedtime. 04/18/21   HeZenia ResidesMD  aspirin EC 81 MG tablet Take 81 mg by mouth daily.    [provider]  atorvastatin (LIPITOR) 40 MG tablet TAKE 1 TABLET BY MOUTH ONCE DAILY 05/27/21   WeLurline DelDO  BD PEN NEEDLE  NANO U/F 32G X 4 MM MISC USE 3 TIMES A DAY 09/19/21   Lurline Del, DO  Cholecalciferol (VITAMIN D) 50 MCG (2000 UT) tablet Take 4,000 Units by mouth daily.    [provider]  glucose blood (ACCU-CHEK AVIVA) test strip Use to check sugar up to 3 times daily 04/14/19   Zenia Resides, MD  hydrALAZINE (APRESOLINE) 25 MG tablet TAKE 3 TABLETS BY MOUTH 3 TIMES A DAY 09/16/21   Welborn, Ryan, DO  Lancets (ACCU-CHEK SOFT TOUCH) lancets Use as instructed 04/05/19   Myles Gip, DO  lidocaine-prilocaine (EMLA) cream Apply 1 application topically as needed. For under breast 10/23/21   Gerrit Heck, MD  liraglutide (VICTOZA) 18 MG/3ML SOPN Inject 0.6 mg into the skin daily. Inject 4.2AJ minus 2 clicks under the skin daily. Patient taking differently: Inject 0.6 mg into the skin daily. 03/28/21   Zenia Resides, MD  losartan (COZAAR) 100 MG tablet Take 1 tablet (100 mg total) by mouth daily. Patient taking differently: Take 100 mg by mouth at bedtime. 03/15/21   Zenia Resides, MD  Melaton-Thean-Cham-PassF-LBalm (MELATONIN + L-THEANINE) CAPS Take 2 capsules by mouth at  bedtime as needed (sleep).    [provider]  meloxicam (MOBIC) 15 MG tablet TAKE 1 TABLET (15 MG TOTAL) BY MOUTH DAILY. Patient not taking: Reported on 11/28/2021 04/10/21   Edrick Kins, DPM  Multiple Vitamins-Minerals (MULTIVITAMIN GUMMIES ADULT PO) Take 2 capsules by mouth daily.    [provider]  nicotine (NICODERM CQ - DOSED IN MG/24 HOURS) 21 mg/24hr patch Place 1 patch (21 mg total) onto the skin daily. Patient not taking: Reported on 11/06/2021 03/28/21   Zenia Resides, MD  nicotine polacrilex (COMMIT) 2 MG lozenge Take 1 lozenge (2 mg total) by mouth as needed for smoking cessation. Patient not taking: Reported on 11/06/2021 03/28/21   Zenia Resides, MD  pindolol (VISKEN) 10 MG tablet TAKE 2 TABLETS BY MOUTH 2 TIMES DAILY. 01/08/22   [name], MD  Polyvinyl Alcohol-Povidone (CLEAR EYES ALL SEASONS OP) Place 1 drop into both eyes daily as needed (allergies/itchy eyes).    [provider]  Probiotic Product (PROBIOTIC PO) Take 2 capsules by mouth daily.    [provider]  spironolactone (ALDACTONE) 100 MG tablet Take 1 tablet (100 mg total) by mouth at bedtime. 03/28/21   Zenia Resides, MD  torsemide (DEMADEX) 20 MG tablet TAKE 1 TABLET BY MOUTH EVERY DAY MAY TAKE 1 EXTRA TABLET DAILY ONLY AS NEEDED Patient taking differently: Take 20 mg by mouth every other day. 06/19/21   Lurline Del, DO  TRELEGY ELLIPTA 200-62.5-25 MCG/ACT AEPB TAKE 1 PUFF BY MOUTH EVERY DAY 09/16/21   Lurline Del, DO  triamcinolone (KENALOG) 0.025 % ointment APPLY 1 APPLICATION TOPICALLY 2 (TWO) TIMES DAILY AS NEEDED. 09/09/21   Lurline Del, DO    Physical Exam: Vitals:   03/16/22 1500 03/16/22 1543 03/16/22 1635 03/16/22 1647  BP: (!) 158/99  (!) 162/128 (!) 163/105  Pulse: 67 72 65 68  Resp:  18 18   Temp:  98 F (36.7 C) 97.7 F (36.5 C)   TempSrc:  Oral Oral   SpO2: 95% 96% 99%   Weight:      Height:       Constitutional: NAD, calm,  comfortable Eyes: PERRL, lids and conjunctivae normal ENMT: Mucous membranes are moist. Posterior pharynx clear of any exudate or lesions.Normal dentition.  Neck: normal, supple Respiratory: clear to  auscultation bilaterally, no wheezing, no crackles. Normal respiratory effort. No accessory muscle use.  Cardiovascular: Regular rate and rhythm, no murmurs / rubs / gallops. No extremity edema. 2+ pedal pulses.  Abdomen: Diffuse tenderness to palpation in the lower abdomen, no guarding or rebound Musculoskeletal: no clubbing / cyanosis. Normal muscle tone.  Skin: no rashes, lesions, ulcers. No induration Neurologic: No focal deficits  Labs on Admission: I have personally reviewed following labs and imaging studies  CBC: Recent Labs  Lab 03/16/22 1304  WBC 12.7*  HGB 15.9*  HCT 48.4*  MCV 86.7  PLT 401   Basic Metabolic Panel: Recent Labs  Lab 03/16/22 1304  NA 138  K 3.5  CL 105  CO2 25  GLUCOSE 103*  BUN 11  CREATININE 0.89  CALCIUM 9.3   Liver Function Tests: No results for input(s): AST, ALT, ALKPHOS, BILITOT, PROT, ALBUMIN in the last 168 hours. Coagulation Profile: No results for input(s): INR, PROTIME in the last 168 hours. BNP (last 3 results) No results for input(s): PROBNP in the last 8760 hours. CBG: No results for input(s): GLUCAP in the last 168 hours. Thyroid Function Tests: No results for input(s): TSH, T4TOTAL, FREET4, T3FREE, THYROIDAB in the last 72 hours. Urine analysis:    Component Value Date/Time   COLORURINE YELLOW 03/16/2022 1345   APPEARANCEUR HAZY (A) 03/16/2022 1345   LABSPEC 1.012 03/16/2022 1345   PHURINE 6.0 03/16/2022 1345   GLUCOSEU NEGATIVE 03/16/2022 1345   HGBUR MODERATE (A) 03/16/2022 1345   HGBUR small 06/27/2010 1409   BILIRUBINUR NEGATIVE 03/16/2022 1345   BILIRUBINUR negative 02/24/2022 1000   BILIRUBINUR NEG 09/18/2016 1013   KETONESUR NEGATIVE 03/16/2022 1345   PROTEINUR NEGATIVE 03/16/2022 1345   UROBILINOGEN 0.2  02/24/2022 1000   UROBILINOGEN 0.2 12/07/2010 0202   NITRITE NEGATIVE 03/16/2022 1345   LEUKOCYTESUR LARGE (A) 03/16/2022 1345     Radiological Exams on Admission: No results found.  Chest x-ray pending  EKG: Independently reviewed.  EKG pending.  Assessment/Plan  Principal Problem:   UTI due to extended-spectrum beta lactamase (ESBL) producing Escherichia coli Active Problems:   Morbid obesity (HCC)   Tobacco use disorder   Hypertension   NASH (nonalcoholic steatohepatitis)   Restrictive lung disease   Chronic diastolic CHF (congestive heart failure) (HCC)   T2DM (type 2 diabetes mellitus) (HCC)   Hyperlipidemia   OSA (obstructive sleep apnea)   COPD (chronic obstructive pulmonary disease) (HCC)  Principal problem ESBL E. coli UTI-patient with symptoms of pressure in the lower abdomen, dysuria, increased frequency and with prior culture showing beta-lactamase UTI UTI treated with Augmentin.  It is a beta-lactam agent, so this is less likely to eradicate an infection in vivo.  Placed on meropenem, await urine culture speciation.  If her nausea resolves and may be able to take oral agents could potentially treat with fosfomycin.  Given reported itching following prior antibiotic course will give Diflucan x1  Active problems COPD-resume home medications, no wheezing, does appear stable  Chest pain-no known CAD but has risk factors.  Obtain chest x-ray, EKG, high-sensitivity troponin x2  Essential hypertension-resume home medications, she is hypertensive here  Obstructive sleep apnea-nightly CPAP  Hyperlipidemia-continue statin  DM2-she was on Jardiance in the past which has been discontinued due to her UTIs.  Currently she is on Victoza.  Keep on sliding scale while here.  Morbid obesity-based on a BMI of 47, she would benefit from weight loss  Tobacco use-recommend cessation, continue nicotine patch  NASH-she would benefit  from weight loss    DVT prophylaxis:  Lovenox  Code Status: Full code  Family Communication: no family at bedside  Disposition Plan: home when ready  Bed Type: telemetry  Consults called: none  Obs/Inp: inpatient (by accepting MD)   Marzetta Board, MD, PhD [facility]  Contact via [email or web address]  03/16/2022, 4:50 PM

## 2022-03-16 NOTE — ED Provider Notes (Signed)
[place] EMERGENCY DEPT Provider Note   CSN: [id number] Arrival date & time: 03/16/22  1231     History  Chief Complaint  Patient presents with   Urinary Frequency    Hailey Barnes is a 60 y.o. female with past medical history significant for hypertension, hepatic steatosis, COPD, congestive heart failure, diabetes, obesity, hyperlipidemia, with recurrent urinary tract infections, multiple visits this year who presents with ongoing urinary frequency, or urgency despite recently completing antibiotics for UTI.  Patient reports that she did have some respite but then symptoms returned 3 to 4 days ago.  Patient previously with culture showing E. coli with multidrug resistance, at her last office visit discontinued her Jardiance, also her PCP had had some concern for interstitial cystitis.  Patient was taking Augmentin for 2 weeks approximately.  Today she also endorses some urgency, feeling like she needs to go the bathroom but is only getting out a small dribble.   Urinary Frequency      Home Medications Prior to Admission medications   Medication Sig Start Date End Date Taking? Authorizing Provider  acetaminophen (TYLENOL) 500 MG tablet Take 1,000 mg by mouth every 6 (six) hours as needed for moderate pain or headache.    [provider]  albuterol (VENTOLIN HFA) 108 (90 Base) MCG/ACT inhaler INHALE 1-2 PUFFS BY MOUTH EVERY 6 HOURS AS NEEDED FOR WHEEZE OR SHORTNESS OF BREATH 08/07/21   Lurline Del, DO  allopurinol (ZYLOPRIM) 300 MG tablet Take 1 tablet (300 mg total) by mouth daily. 10/23/21   Gerrit Heck, MD  amLODipine (NORVASC) 2.5 MG tablet Take 1 tablet (2.5 mg total) by mouth at bedtime. 04/18/21   Zenia Resides, MD  aspirin EC 81 MG tablet Take 81 mg by mouth daily.    [provider]  atorvastatin (LIPITOR) 40 MG tablet TAKE 1 TABLET BY MOUTH ONCE DAILY 05/27/21   Lurline Del, DO  BD PEN NEEDLE NANO U/F 32G X 4 MM MISC USE 3  TIMES A DAY 09/19/21   Lurline Del, DO  Cholecalciferol (VITAMIN D) 50 MCG (2000 UT) tablet Take 4,000 Units by mouth daily.    [provider]  glucose blood (ACCU-CHEK AVIVA) test strip Use to check sugar up to 3 times daily 04/14/19   Zenia Resides, MD  hydrALAZINE (APRESOLINE) 25 MG tablet TAKE 3 TABLETS BY MOUTH 3 TIMES A DAY 09/16/21   Welborn, Ryan, DO  Lancets (ACCU-CHEK SOFT TOUCH) lancets Use as instructed 04/05/19   Myles Gip, DO  lidocaine-prilocaine (EMLA) cream Apply 1 application topically as needed. For under breast 10/23/21   Gerrit Heck, MD  liraglutide (VICTOZA) 18 MG/3ML SOPN Inject 0.6 mg into the skin daily. Inject 5.0NL minus 2 clicks under the skin daily. Patient taking differently: Inject 0.6 mg into the skin daily. 03/28/21   Zenia Resides, MD  losartan (COZAAR) 100 MG tablet Take 1 tablet (100 mg total) by mouth daily. Patient taking differently: Take 100 mg by mouth at bedtime. 03/15/21   Zenia Resides, MD  Melaton-Thean-Cham-PassF-LBalm (MELATONIN + L-THEANINE) CAPS Take 2 capsules by mouth at bedtime as needed (sleep).    [provider]  meloxicam (MOBIC) 15 MG tablet TAKE 1 TABLET (15 MG TOTAL) BY MOUTH DAILY. Patient not taking: Reported on 11/28/2021 04/10/21   Edrick Kins, DPM  Multiple Vitamins-Minerals (MULTIVITAMIN GUMMIES ADULT PO) Take 2 capsules by mouth daily.    [provider]  nicotine (NICODERM CQ - DOSED IN MG/24 HOURS)  21 mg/24hr patch Place 1 patch (21 mg total) onto the skin daily. Patient not taking: Reported on 11/06/2021 03/28/21   Zenia Resides, MD  nicotine polacrilex (COMMIT) 2 MG lozenge Take 1 lozenge (2 mg total) by mouth as needed for smoking cessation. Patient not taking: Reported on 11/06/2021 03/28/21   Zenia Resides, MD  pindolol (VISKEN) 10 MG tablet TAKE 2 TABLETS BY MOUTH 2 TIMES DAILY. 01/08/22   [name], MD  Polyvinyl Alcohol-Povidone (CLEAR EYES ALL SEASONS OP) Place 1 drop  into both eyes daily as needed (allergies/itchy eyes).    [provider]  Probiotic Product (PROBIOTIC PO) Take 2 capsules by mouth daily.    [provider]  spironolactone (ALDACTONE) 100 MG tablet Take 1 tablet (100 mg total) by mouth at bedtime. 03/28/21   Zenia Resides, MD  torsemide (DEMADEX) 20 MG tablet TAKE 1 TABLET BY MOUTH EVERY DAY MAY TAKE 1 EXTRA TABLET DAILY ONLY AS NEEDED Patient taking differently: Take 20 mg by mouth every other day. 06/19/21   Lurline Del, DO  TRELEGY ELLIPTA 200-62.5-25 MCG/ACT AEPB TAKE 1 PUFF BY MOUTH EVERY DAY 09/16/21   Lurline Del, DO  triamcinolone (KENALOG) 0.025 % ointment APPLY 1 APPLICATION TOPICALLY 2 (TWO) TIMES DAILY AS NEEDED. 09/09/21   Lurline Del, DO      Allergies    Bupropion and Norvasc [amlodipine]    Review of Systems   Review of Systems  Genitourinary:  Positive for dysuria, frequency and urgency.  All other systems reviewed and are negative.  Physical Exam Updated Vital Signs BP (!) 173/107 (BP Location: Right Arm)   Pulse 90   Temp 98.1 F (36.7 C) (Oral)   Ht 5' 2"  (1.575 m)   Wt 117 kg   LMP 04/06/2017   SpO2 97%   BMI 47.18 kg/m  Physical Exam Vitals and nursing note reviewed.  Constitutional:      General: She is not in acute distress.    Appearance: Normal appearance. She is obese.  HENT:     Head: Normocephalic and atraumatic.  Eyes:     General:        Right eye: No discharge.        Left eye: No discharge.  Cardiovascular:     Rate and Rhythm: Normal rate and regular rhythm.     Heart sounds: No murmur heard.   No friction rub. No gallop.  Pulmonary:     Effort: Pulmonary effort is normal.     Breath sounds: Normal breath sounds.  Abdominal:     General: Bowel sounds are normal.     Palpations: Abdomen is soft.     Comments: Some tenderness to palpation especially suprapubically, no rebound, rigidity, guarding.  No significant abdominal distention.  Normal bowel sounds  throughout.  Skin:    General: Skin is warm and dry.     Capillary Refill: Capillary refill takes less than 2 seconds.  Neurological:     Mental Status: She is alert and oriented to person, place, and time.  Psychiatric:        Mood and Affect: Mood normal.        Behavior: Behavior normal.    ED Results / Procedures / Treatments   Labs (all labs ordered are listed, but only abnormal results are displayed) Labs Reviewed  URINALYSIS, ROUTINE W REFLEX MICROSCOPIC - Abnormal; Notable for the following components:      Result Value   APPearance HAZY (*)  Hgb urine dipstick MODERATE (*)    Leukocytes,Ua LARGE (*)    WBC, UA >50 (*)    Bacteria, UA MANY (*)    All other components within normal limits  CBC - Abnormal; Notable for the following components:   WBC 12.7 (*)    RBC 5.58 (*)    Hemoglobin 15.9 (*)    HCT 48.4 (*)    All other components within normal limits  BASIC METABOLIC PANEL - Abnormal; Notable for the following components:   Glucose, Bld 103 (*)    All other components within normal limits  URINE CULTURE    EKG None  Radiology No results found.  Procedures Procedures    Medications Ordered in ED Medications  meropenem (MERREM) 500 mg in sodium chloride 0.9 % 100 mL IVPB (has no administration in time range)  oxyCODONE-acetaminophen (PERCOCET/ROXICET) 5-325 MG per tablet 1 tablet (1 tablet Oral Given 03/16/22 1300)  ondansetron (ZOFRAN-ODT) disintegrating tablet 4 mg (4 mg Oral Given 03/16/22 1300)    ED Course/ Medical Decision Making/ A&P Clinical Course as of 03/16/22 1456  Sun Mar 16, 2022  1441 Spoke with Dr. Tommy Medal with ID who requests admission with IV antibiotics if patient with high clinical suspicion for acute UTI [CP]    Clinical Course User Index [CP] Anselmo Pickler, PA-C                           Medical Decision Making Amount and/or Complexity of Data Reviewed Labs: ordered.  Risk Prescription drug management.   This  patient is a 61 y.o. female who presents to the ED for concern of dysuria, urinary frequency, urinary urgency, this involves an extensive number of treatment options, and is a complaint that carries with it a high risk of complications and morbidity. The emergent differential diagnosis prior to evaluation includes, but is not limited to, UTI, interstitial cystitis, pyelonephritis, nephrolithiasis, pelvic inflammatory disease, versus other.   This is not an exhaustive differential.   Past Medical History / Co-morbidities / Social History: hypertension, hepatic steatosis, COPD, congestive heart failure, diabetes, obesity, hyperlipidemia, with recurrent urinary tract infections  Additional history: Chart reviewed. Pertinent results include: Extensively reviewed recent PCP visits, urine cultures, antibiotic regimen for similar problem.  Urine culture from previous urinary tract infection earlier this month showed sensitivity only to Augmentin, the carbapenem class, and Zosyn.  Physical Exam: Physical exam performed. The pertinent findings include: Overall well-appearing nonfebrile patient who is somewhat hypertensive with systolic of 384.  She has some tenderness to palpation suprapubically, no significant rebound, rigidity, guarding.  No abdominal distention noted.  No acute distress.  Lab Tests: I ordered, and personally interpreted labs.  The pertinent results include: Patient with overall unremarkable BMP.  Her CBC shows a mild leukocytosis with white blood cells 12.7.  Her urinalysis does seem suggestive of urinary tract infection with moderate hemoglobin large leukocytes greater than 50 white blood cells and many bacteria.  She does have some minimal contamination with squamous cells, however I do believe this is representative of a true urinary tract infection and contact the patient's urinary frequency, urgency, and frequent history of same.  Her urine culture is still pending at this time.    Medications: I ordered medication including Zofran, Percocet for pain, meropenem for urinary tract infectious symptoms with likely ESBL organism.  Patient with improvement of pain, nausea after medication, continues to appear nonseptic, nontoxic appearing.  I have low clinical  suspicion for emergent pyelonephritis, but in context of multidrug-resistant organism, feel that it is important to treat aggressively, especially after speaking with infectious disease.  Consultations Obtained: I requested consultation with the infectious disease doctor, spoke with Dr. Tommy Medal, and discussed lab and imaging findings as well as pertinent plan - they recommend: If patient's clinical condition is clinically suspicious for true urinary tract infection he did recommend carbapenem class antibiotics for ESBL.  Spoke with admitting physician Dr. Cyndia Skeeters who agrees to admission at this time for IV antibiotics and further management.  I discussed this case with my attending physician Dr. Tyrone Nine who cosigned this note including patient's presenting symptoms, physical exam, and planned diagnostics and interventions. Attending physician stated agreement with plan or made changes to plan which were implemented.    Final Clinical Impression(s) / ED Diagnoses Final diagnoses:  None    Rx / DC Orders ED Discharge Orders     None         Anselmo Pickler, PA-C 03/16/22 [place], [place], DO 03/17/22 [id number]

## 2022-03-16 NOTE — Progress Notes (Addendum)
PHARMACY NOTE:  ANTIMICROBIAL RENAL DOSAGE ADJUSTMENT  Current antimicrobial regimen includes a mismatch between antimicrobial dosage and estimated renal function.  As per policy approved by the Pharmacy & Therapeutics and Medical Executive Committees, the antimicrobial dosage will be adjusted accordingly.  Current antimicrobial dosage:  meropenem 500 mg q8h  Indication: ESBL UTI  Renal Function:  Estimated Creatinine Clearance: 82.6 mL/min (by C-G formula based on SCr of 0.89 mg/dL). []      On intermittent HD, scheduled: []      On CRRT    Antimicrobial dosage has been changed to:  meropenem 1000 mg q8h    Thank you for allowing pharmacy to be a part of this patient's care.  Lynelle Doctor, [facility] 03/16/2022 4:28 PM  ________________________________  Adden: Patient 's a 61 y.o F with ESBL Ecoli with urine culture collected on 02/24/22.    She was prescribed augmentin by her PCP.  She presented to the ED on 03/16/22 with c/o urinary frequency/urgency. ID team recom. to start meropenem for infection. - see above for meropenem dose  - with good renal function. Pharmacy will sign off for abx. Re-consult [place] if need further assistance  Dia Sitter, PharmD, BCPS 03/16/2022 5:01 PM

## 2022-03-17 ENCOUNTER — Inpatient Hospital Stay (HOSPITAL_COMMUNITY): Payer: Medicare Other

## 2022-03-17 ENCOUNTER — Encounter (HOSPITAL_COMMUNITY): Payer: Self-pay | Admitting: Student

## 2022-03-17 DIAGNOSIS — B9629 Other Escherichia coli [E. coli] as the cause of diseases classified elsewhere: Secondary | ICD-10-CM | POA: Diagnosis not present

## 2022-03-17 DIAGNOSIS — Z1612 Extended spectrum beta lactamase (ESBL) resistance: Secondary | ICD-10-CM | POA: Diagnosis not present

## 2022-03-17 DIAGNOSIS — N390 Urinary tract infection, site not specified: Secondary | ICD-10-CM | POA: Diagnosis not present

## 2022-03-17 LAB — CBC
HCT: 46.1 % — ABNORMAL HIGH (ref 36.0–46.0)
Hemoglobin: 15.1 g/dL — ABNORMAL HIGH (ref 12.0–15.0)
MCH: 29 pg (ref 26.0–34.0)
MCHC: 32.8 g/dL (ref 30.0–36.0)
MCV: 88.7 fL (ref 80.0–100.0)
Platelets: 188 10*3/uL (ref 150–400)
RBC: 5.2 MIL/uL — ABNORMAL HIGH (ref 3.87–5.11)
RDW: 15.2 % (ref 11.5–15.5)
WBC: 10.3 10*3/uL (ref 4.0–10.5)
nRBC: 0 % (ref 0.0–0.2)

## 2022-03-17 LAB — COMPREHENSIVE METABOLIC PANEL
ALT: 19 U/L (ref 0–44)
AST: 16 U/L (ref 15–41)
Albumin: 3.4 g/dL — ABNORMAL LOW (ref 3.5–5.0)
Alkaline Phosphatase: 80 U/L (ref 38–126)
Anion gap: 6 (ref 5–15)
BUN: 12 mg/dL (ref 6–20)
CO2: 27 mmol/L (ref 22–32)
Calcium: 9 mg/dL (ref 8.9–10.3)
Chloride: 107 mmol/L (ref 98–111)
Creatinine, Ser: 0.88 mg/dL (ref 0.44–1.00)
GFR, Estimated: >60 mL/min (ref >60)
Glucose, Bld: 89 mg/dL (ref 70–99)
Potassium: 3.4 mmol/L — ABNORMAL LOW (ref 3.5–5.1)
Sodium: 140 mmol/L (ref 135–145)
Total Bilirubin: 0.5 mg/dL (ref 0.3–1.2)
Total Protein: 6.4 g/dL — ABNORMAL LOW (ref 6.5–8.1)

## 2022-03-17 LAB — GLUCOSE, CAPILLARY
Glucose-Capillary: 97 mg/dL (ref 70–99)
Glucose-Capillary: 103 mg/dL — ABNORMAL HIGH (ref 70–99)
Glucose-Capillary: 100 mg/dL — ABNORMAL HIGH (ref 70–99)
Glucose-Capillary: 114 mg/dL — ABNORMAL HIGH (ref 70–99)

## 2022-03-17 MED ORDER — HYDRALAZINE HCL 50 MG PO TABS
50.0000 mg | ORAL_TABLET | Freq: Three times a day (TID) | ORAL | Status: DC
  Administered 2022-03-17 – 2022-03-18 (×3): 50 mg via ORAL
  Filled 2022-03-17 (×3): qty 1

## 2022-03-17 MED ORDER — NICOTINE 21 MG/24HR TD PT24
21.0000 mg | MEDICATED_PATCH | TRANSDERMAL | Status: DC
  Administered 2022-03-17: 21 mg via TRANSDERMAL
  Filled 2022-03-17: qty 1

## 2022-03-17 MED ORDER — LOSARTAN POTASSIUM 50 MG PO TABS
100.0000 mg | ORAL_TABLET | Freq: Every day | ORAL | Status: DC
  Administered 2022-03-17 – 2022-03-18 (×2): 100 mg via ORAL
  Filled 2022-03-17 (×2): qty 2

## 2022-03-17 NOTE — TOC Initial Note (Signed)
Transition of Care [facility]) - Initial/Assessment Note    Patient Details  Name: Hailey Barnes MRN: [id number] Date of Birth: [birth year]  [facility] CM/SW Contact:    Leeroy Cha, RN Phone Number: 03/17/2022, 7:35 AM  Clinical Narrative:                  Transition of Care [facility]) Screening Note   Patient Details  Name: Hailey Barnes Date of Birth: Feb 06, 1961   Transition [facility]) CM/SW Contact:    Leeroy Cha, RN Phone Number: 03/17/2022, 7:35 AM    Transition of Care Department [facility]) has reviewed patient and no TOC needs have been identified at this time. We will continue to monitor patient advancement through interdisciplinary progression rounds. If new patient transition needs arise, please place a TOC consult.    Expected Discharge Plan: Home/Self Care Barriers to Discharge: No Barriers Identified   Patient Goals and CMS Choice Patient states their goals for this hospitalization and ongoing recovery are:: to return to my home CMS Medicare.gov Compare Post Acute Care list provided to:: Patient    Expected Discharge Plan and Services Expected Discharge Plan: Home/Self Care   Discharge Planning Services: CM Consult   Living arrangements for the past 2 months: Single Family Home                                      Prior Living Arrangements/Services Living arrangements for the past 2 months: Single Family Home Lives with:: Self Patient language and need for interpreter reviewed:: Yes Do you feel safe going back to the place where you live?: Yes            Criminal Activity/Legal Involvement Pertinent to Current Situation/Hospitalization: No - Comment as needed  Activities of Daily Living Home Assistive Devices/Equipment: Cane (specify quad or straight), Walker (specify type) ADL Screening (condition at time of admission) Patient's cognitive ability adequate to safely complete daily activities?: Yes Is the  patient deaf or have difficulty hearing?: No Does the patient have difficulty seeing, even when wearing glasses/contacts?: No Does the patient have difficulty concentrating, remembering, or making decisions?: No Patient able to express need for assistance with ADLs?: Yes Does the patient have difficulty dressing or bathing?: No Independently performs ADLs?: Yes (appropriate for developmental age) Does the patient have difficulty walking or climbing stairs?: Yes Weakness of Legs: None Weakness of Arms/Hands: None  Permission Sought/Granted                  Emotional Assessment       Orientation: : Oriented to Self, Oriented to Place, Oriented to  Time, Oriented to Situation Alcohol / Substance Use: Not Applicable Psych Involvement: No (comment)  Admission diagnosis:  UTI due to extended-spectrum beta lactamase (ESBL) producing Escherichia coli [N39.0, B96.29, Z16.12] Patient Active Problem List   Diagnosis Date Noted   UTI due to extended-spectrum beta lactamase (ESBL) producing Escherichia coli 03/16/2022   Breast mass in female 10/23/2021   Melanosis 11/07/2020   Lesion of cervix 07/05/2020   COPD (chronic obstructive pulmonary disease) ([place]) 25/36/6440   Plica of knee, left 34/74/2595   Chondromalacia, left knee 08/16/2018   Primary osteoarthritis of both knees 03/24/2018   Bilateral primary osteoarthritis of first carpometacarpal joints 11/16/2017   Carpal tunnel syndrome, bilateral 10/21/2017   OSA (obstructive sleep apnea) 10/30/2016   Left lumbar radiculopathy 06/02/2016  Hyperlipidemia 02/07/2016   T2DM (type 2 diabetes mellitus) ([place]) 12/07/2015   Depression 04/19/2015   Diminished vision 06/23/2014   Chronic diastolic CHF (congestive heart failure) ([place]) 06/13/2014   Restrictive lung disease 05/10/2014   Vaginal discharge 03/22/2014   NASH (nonalcoholic steatohepatitis) 01/04/2014   Hypertension 05/26/2010   Morbid obesity ([place]) 12/17/2006   Tobacco use  disorder 12/17/2006   PCP:  Lurline Del, DO Pharmacy:   [facility] [place] [address] Phone: [phone] Fax: [phone]  [id number] [place] [place]Phone: [phone]: [phone]    Social Determinants of Health (SDOH) Interventions    Readmission Risk Interventions     View : No data to display.

## 2022-03-17 NOTE — Progress Notes (Signed)
PROGRESS NOTE  Hailey Barnes KPV:[id number] DOB: July 17, 1961 DOA: 03/16/2022 PCP: Lurline Del, DO   LOS: 1 day   Brief Narrative / Interim history: 61 y.o. female with medical history significant of type 2 diabetes mellitus, COPD, DM 2, ongoing tobacco use, obesity, hypertension, hyperlipidemia, OSA on CPAP who comes to the hospital with complaints of lower abdominal pain, nausea, burning with urination, increased frequency.  Patient tells me she has been having the symptoms, on and off for the past 6 months.  Most recently, at the beginning of this month, she was seen by PCP for similar symptoms.  Urinalysis at that time grew ESBL E. Coli, and it appears that she was treated Augmentin for this as in vitro it appeared to be sensitive.  She felt better initially but then symptoms recurred.  ID curb sided by EDP who recommended carbapenems and admission  Subjective / 24h Interval events: She is feeling better today.  She is finally able to urinate better.  Still has dysuria but improved.  Less abdominal pressure.  Less nausea and has been eating a little bit more  Assesement and Plan: Principal Problem:   UTI due to extended-spectrum beta lactamase (ESBL) producing Escherichia coli Active Problems:   Morbid obesity (HCC)   Tobacco use disorder   Hypertension   NASH (nonalcoholic steatohepatitis)   Restrictive lung disease   Chronic diastolic CHF (congestive heart failure) (HCC)   T2DM (type 2 diabetes mellitus) (HCC)   Hyperlipidemia   OSA (obstructive sleep apnea)   COPD (chronic obstructive pulmonary disease) (HCC)  Principal problem ESBL E. coli UTI-patient with symptoms of pressure in the lower abdomen, dysuria, increased frequency and with prior culture showing beta-lactamase UTI UTI treated with Augmentin.  Augmentin is less likely to eradicate an infection in vivo.  She was placed on meropenem, urine cultures were sent and pending.  Given reported itching associated after  using Augmentin she was given Diflucan x1 on admission.  Still has some nausea, continue IV antibiotics for today.  If she continues to improve could potentially do fosfomycin tomorrow and discharge. -Patient reports hematuria when she started having these urinary tract infections, raising suspicion for nephrolithiasis.  She does not seem to have had imaging for this, obtain a noncontrast CT scan today  Active problems COPD-resume home medications, no wheezing, stable this morning   Chest pain-reported on admission, no known CAD but has risk factors.  Chest x-ray, EKG, high-sensitivity troponin did not indicate ACS  Essential hypertension-continue home medications  Obstructive sleep apnea-nightly CPAP  Hyperlipidemia-continue statin  DM2-she was on Jardiance in the past which has been discontinued due to her UTIs.  Currently she is on Victoza which is now on hold.  Keep on sliding scale while here.   Morbid obesity-based on a BMI of 47, she would benefit from weight loss  Tobacco use-recommend cessation, continue nicotine patch   NASH-she would benefit from weight loss    Scheduled Meds:  amLODipine  2.5 mg Oral QHS   aspirin EC  81 mg Oral Daily   atorvastatin  40 mg Oral Daily   enoxaparin (LOVENOX) injection  60 mg Subcutaneous Q24H   fluticasone furoate-vilanterol  1 puff Inhalation Daily   And   umeclidinium bromide  1 puff Inhalation Daily   hydrALAZINE  50 mg Oral Q8H   insulin aspart  0-5 Units Subcutaneous QHS   insulin aspart  0-9 Units Subcutaneous TID WC   nicotine  21 mg Transdermal Q24H   Continuous Infusions:  meropenem (MERREM) IV 1 g (03/17/22 0801)   PRN Meds:.acetaminophen **OR** acetaminophen, albuterol, diphenhydrAMINE, morphine injection, naphazoline-glycerin, ondansetron **OR** ondansetron (ZOFRAN) IV, senna-docusate  Diet Orders (From admission, onward)     Start     Ordered   03/16/22 1647  Diet Carb Modified Fluid consistency: Thin; Room service  appropriate? Yes  Diet effective now       Question Answer Comment  Diet-HS Snack? Nothing   Calorie Level Medium 1600-2000   Fluid consistency: Thin   Room service appropriate? Yes      03/16/22 1647            DVT prophylaxis:    Lab Results  Component Value Date   PLT 188 03/17/2022      Code Status: Full Code  Family Communication: No family at bedside  Status is: Inpatient Remains inpatient appropriate because: IV antibiotics   Level of care: Telemetry  Consultants:  None  Procedures:  none  Microbiology  Urine cultures - pending  Antimicrobials: Meropenem    Objective: Vitals:   03/16/22 1647 03/16/22 2148 03/17/22 0550 03/17/22 0915  BP: (!) 163/105 (!) 153/106 (!) 170/112 (!) 169/110  Pulse: 68 76  81  Resp:    16  Temp:  98.4 F (36.9 C)  97.8 F (36.6 C)  TempSrc:  Oral  Oral  SpO2:  97%  94%  Weight:      Height:        Intake/Output Summary (Last 24 hours) at 03/17/2022 1007 Last data filed at 03/17/2022 0553 Gross per 24 hour  Intake 940.2 ml  Output 403 ml  Net 537.2 ml   Wt Readings from Last 3 Encounters:  03/16/22 117 kg  02/24/22 117.9 kg  02/05/22 117.3 kg    Examination:  Constitutional: NAD Eyes: no scleral icterus ENMT: Mucous membranes are moist.  Neck: normal, supple Respiratory: clear to auscultation bilaterally, no wheezing, no crackles. Normal respiratory effort. No accessory muscle use.  Cardiovascular: Regular rate and rhythm, no murmurs / rubs / gallops.  Abdomen: non distended, no tenderness. Bowel sounds positive.  Musculoskeletal: no clubbing / cyanosis.  Skin: no rashes Neurologic: non focal    Data Reviewed: I have independently reviewed following labs and imaging studies  CBC Recent Labs  Lab 03/16/22 1304 03/17/22 0320  WBC 12.7* 10.3  HGB 15.9* 15.1*  HCT 48.4* 46.1*  PLT 209 188  MCV 86.7 88.7  MCH 28.5 29.0  MCHC 32.9 32.8  RDW 15.3 15.2    Recent Labs  Lab 03/16/22 1304  03/16/22 1708 03/17/22 0320  NA 138  --  140  K 3.5  --  3.4*  CL 105  --  107  CO2 25  --  27  GLUCOSE 103*  --  89  BUN 11  --  12  CREATININE 0.89  --  0.88  CALCIUM 9.3  --  9.0  AST  --   --  16  ALT  --   --  19  ALKPHOS  --   --  80  BILITOT  --   --  0.5  ALBUMIN  --   --  3.4*  HGBA1C  --  6.3*  --     ------------------------------------------------------------------------------------------------------------------ No results for input(s): CHOL, HDL, LDLCALC, TRIG, CHOLHDL, LDLDIRECT in the last 72 hours.  Lab Results  Component Value Date   HGBA1C 6.3 (H) 03/16/2022   ------------------------------------------------------------------------------------------------------------------ No results for input(s): TSH, T4TOTAL, T3FREE, THYROIDAB in the last 72 hours.  Invalid  input(s): FREET3  Cardiac Enzymes No results for input(s): CKMB, TROPONINI, MYOGLOBIN in the last 168 hours.  Invalid input(s): CK ------------------------------------------------------------------------------------------------------------------    Component Value Date/Time   BNP 13.9 12/02/2016 1349   BNP 4.4 11/22/2015 1629    CBG: Recent Labs  Lab 03/16/22 1726 03/16/22 2144 03/17/22 0750  GLUCAP 97 94 97    No results found for this or any previous visit (from the past 240 hour(s)).   Radiology Studies: DG CHEST PORT 1 VIEW  Result Date: 03/16/2022 CLINICAL DATA:  UTI EXAM: PORTABLE CHEST 1 VIEW COMPARISON:  05/14/2021 FINDINGS: The heart size and mediastinal contours are within normal limits. Both lungs are clear. The visualized skeletal structures are unremarkable. IMPRESSION: No acute abnormality of the lungs in AP portable projection. Electronically Signed   By: Delanna Ahmadi M.D.   On: 03/16/2022 17:56     Marzetta Board, MD, PhD [facility]  Between 7 am - 7 pm I am available, please contact me via Amion (for emergencies) or Securechat (non urgent  messages)  Between 7 pm - 7 am I am not available, please contact night coverage MD/APP via Amion

## 2022-03-18 DIAGNOSIS — K7581 Nonalcoholic steatohepatitis (NASH): Secondary | ICD-10-CM

## 2022-03-18 DIAGNOSIS — N390 Urinary tract infection, site not specified: Secondary | ICD-10-CM | POA: Diagnosis not present

## 2022-03-18 DIAGNOSIS — I10 Essential (primary) hypertension: Secondary | ICD-10-CM

## 2022-03-18 DIAGNOSIS — I5032 Chronic diastolic (congestive) heart failure: Secondary | ICD-10-CM

## 2022-03-18 LAB — COMPREHENSIVE METABOLIC PANEL
ALT: 20 U/L (ref 0–44)
AST: 20 U/L (ref 15–41)
Albumin: 3.8 g/dL (ref 3.5–5.0)
Alkaline Phosphatase: 77 U/L (ref 38–126)
Anion gap: 8 (ref 5–15)
BUN: 12 mg/dL (ref 6–20)
CO2: 27 mmol/L (ref 22–32)
Calcium: 9 mg/dL (ref 8.9–10.3)
Chloride: 105 mmol/L (ref 98–111)
Creatinine, Ser: 0.81 mg/dL (ref 0.44–1.00)
GFR, Estimated: >60 mL/min (ref >60)
Glucose, Bld: 94 mg/dL (ref 70–99)
Potassium: 3.5 mmol/L (ref 3.5–5.1)
Sodium: 140 mmol/L (ref 135–145)
Total Bilirubin: 0.9 mg/dL (ref 0.3–1.2)
Total Protein: 7 g/dL (ref 6.5–8.1)

## 2022-03-18 LAB — MAGNESIUM: Magnesium: 2.4 mg/dL (ref 1.7–2.4)

## 2022-03-18 LAB — CBC
HCT: 48.7 % — ABNORMAL HIGH (ref 36.0–46.0)
Hemoglobin: 15.7 g/dL — ABNORMAL HIGH (ref 12.0–15.0)
MCH: 28.2 pg (ref 26.0–34.0)
MCHC: 32.2 g/dL (ref 30.0–36.0)
MCV: 87.4 fL (ref 80.0–100.0)
Platelets: 208 10*3/uL (ref 150–400)
RBC: 5.57 MIL/uL — ABNORMAL HIGH (ref 3.87–5.11)
RDW: 15 % (ref 11.5–15.5)
WBC: 9.5 10*3/uL (ref 4.0–10.5)
nRBC: 0 % (ref 0.0–0.2)

## 2022-03-18 LAB — URINE CULTURE: Culture: >=100000 — AB

## 2022-03-18 LAB — GLUCOSE, CAPILLARY: Glucose-Capillary: 114 mg/dL — ABNORMAL HIGH (ref 70–99)

## 2022-03-18 MED ORDER — CHLORHEXIDINE GLUCONATE 0.12 % MT SOLN
15.0000 mL | Freq: Two times a day (BID) | OROMUCOSAL | Status: DC
  Administered 2022-03-18: 15 mL via OROMUCOSAL
  Filled 2022-03-18: qty 15

## 2022-03-18 MED ORDER — ORAL CARE MOUTH RINSE: 15.0000 mL | Freq: Two times a day (BID) | OROMUCOSAL | Status: DC

## 2022-03-18 MED ORDER — BISACODYL 5 MG PO TBEC
5.0000 mg | DELAYED_RELEASE_TABLET | Freq: Once | ORAL | Status: AC
  Administered 2022-03-18: 5 mg via ORAL
  Filled 2022-03-18: qty 1

## 2022-03-18 MED ORDER — FOSFOMYCIN TROMETHAMINE 3 G PO PACK
3.0000 g | PACK | Freq: Once | ORAL | Status: AC
  Administered 2022-03-18: 3 g via ORAL
  Filled 2022-03-18: qty 3

## 2022-03-18 MED ORDER — HYDRALAZINE HCL 25 MG PO TABS: 50.0000 mg | ORAL_TABLET | Freq: Three times a day (TID) | ORAL | Status: DC

## 2022-03-18 MED ORDER — TORSEMIDE 20 MG PO TABS: 20.0000 mg | ORAL_TABLET | ORAL | Status: DC

## 2022-03-18 NOTE — Discharge Summary (Signed)
Physician Discharge Summary  Hailey Barnes TXM:[id number] DOB: 1961-02-05 DOA: 03/16/2022  PCP: Lurline Del, DO  Admit date: 03/16/2022 Discharge date: 03/18/2022  Admitted From: Home Disposition: Home  Recommendations for Outpatient Follow-up:  Follow up with PCP in 1 week with repeat CBC/BMP Follow up in ED if symptoms worsen or new appear   Home Health: No Equipment/Devices: None  Discharge Condition: Stable CODE STATUS: Full Diet recommendation: Heart healthy/carb modified  Brief/Interim Summary: 61 y.o. female with medical history significant of type 2 diabetes mellitus, COPD, DM 2, ongoing tobacco use, obesity, hypertension, hyperlipidemia, OSA on CPAP presented with lower abdominal pain, nausea, increased frequency and dysuria.  She was recently treated as an outpatient by PCP for ESBL E. coli urinary tract infection with Augmentin.  She felt better initially but then symptoms recurred.  She was admitted for ESBL E. coli UTI and started on IV meropenem; ID was curb sided.  Given the hospitalization, her symptoms have improved.  She feels much better and wants to go home today.  She will be given 1 dose of oral fosfomycin today and will be discharged home today.  Outpatient follow-up with PCP.  Discharge Diagnoses:   ESBL E. coli UTI -Currently on meropenem.  Symptoms have much improved.  Urine culture grew Klebsiella pneumoniae and ESBL E. coli. -We will give 1 dose of oral fosfomycin today.  Patient wants to go home today.  Discharge home today. -She is hemodynamically stable and tolerating diet.  Essential hypertension -Blood pressure extremely elevated.  Resume all of her home medications.  Outpatient follow-up with PCP.  Chest pain: Reported on admission; resolved -Chest x-ray, EKG, high-sensitivity troponin did not indicate any signs of acute coronary syndrome.  Outpatient follow-up with PCP/cardiology  Morbid obesity -Outpatient follow-up  Obstructive sleep  apnea--continue nightly CPAP  Hyperlipidemia Continue statin  Diabetes mellitus type 2 -Carb modified diet.  Continue home regimen and outpatient follow-up with PCP  NASH -He would benefit from weight loss.  Outpatient follow-up Discharge Instructions  Discharge Instructions     Diet - low sodium heart healthy   Complete by: As directed    Diet Carb Modified   Complete by: As directed    Increase activity slowly   Complete by: As directed       Allergies as of 03/18/2022       Reactions   Bupropion Other (See Comments)   Throat soreness and difficulty swallowing.    Norvasc [amlodipine] Swelling   Significant leg swelling with 56m. Able to tolerate 2.534m         Medication List     STOP taking these medications    lidocaine-prilocaine cream Commonly known as: EMLA   meloxicam 15 MG tablet Commonly known as: MOBIC   nicotine 21 mg/24hr patch Commonly known as: NICODERM CQ - dosed in mg/24 hours   nicotine polacrilex 2 MG lozenge Commonly known as: COMMIT       TAKE these medications    Accu-Chek Aviva test strip Generic drug: glucose blood Use to check sugar up to 3 times daily   accu-chek soft touch lancets Use as instructed   albuterol 108 (90 Base) MCG/ACT inhaler Commonly known as: VENTOLIN HFA INHALE 1-2 PUFFS BY MOUTH EVERY 6 HOURS AS NEEDED FOR WHEEZE OR SHORTNESS OF BREATH What changed: See the new instructions.   allopurinol 300 MG tablet Commonly known as: ZYLOPRIM Take 1 tablet (300 mg total) by mouth daily.   amLODipine 2.5 MG tablet Commonly known as: NORVASC  Take 1 tablet (2.5 mg total) by mouth at bedtime.   aspirin EC 81 MG tablet Take 81 mg by mouth daily.   atorvastatin 40 MG tablet Commonly known as: LIPITOR TAKE 1 TABLET BY MOUTH ONCE DAILY   BD Pen Needle Nano U/F 32G X 4 MM Misc Generic drug: Insulin Pen Needle USE 3 TIMES A DAY   CLEAR EYES ALL SEASONS OP Place 1 drop into both eyes daily as needed  (allergies/itchy eyes).   hydrALAZINE 25 MG tablet Commonly known as: APRESOLINE Take 2 tablets (50 mg total) by mouth 3 (three) times daily. What changed: See the new instructions.   ibuprofen 200 MG tablet Commonly known as: ADVIL Take 400 mg by mouth every 6 (six) hours as needed for moderate pain.   losartan 100 MG tablet Commonly known as: COZAAR Take 1 tablet (100 mg total) by mouth daily. What changed: when to take this   Melatonin + L-Theanine Caps Take 2 capsules by mouth at bedtime as needed (sleep).   MULTIVITAMIN GUMMIES ADULT PO Take 2 capsules by mouth daily.   naproxen sodium 220 MG tablet Commonly known as: ALEVE Take 440 mg by mouth daily as needed (pain).   pindolol 10 MG tablet Commonly known as: VISKEN TAKE 2 TABLETS BY MOUTH 2 TIMES DAILY.   spironolactone 100 MG tablet Commonly known as: ALDACTONE Take 1 tablet (100 mg total) by mouth at bedtime.   torsemide 20 MG tablet Commonly known as: DEMADEX Take 1 tablet (20 mg total) by mouth every other day. What changed: See the new instructions.   Trelegy Ellipta 200-62.5-25 MCG/ACT Aepb Generic drug: Fluticasone-Umeclidin-Vilant TAKE 1 PUFF BY MOUTH EVERY DAY What changed: See the new instructions.   triamcinolone 0.025 % ointment Commonly known as: KENALOG APPLY 1 APPLICATION TOPICALLY 2 (TWO) TIMES DAILY AS NEEDED. What changed: reasons to take this   Victoza 18 MG/3ML Sopn Generic drug: liraglutide Inject 0.6 mg into the skin daily. Inject 7.59YO minus 2 clicks under the skin daily. What changed: additional instructions   Vitamin D 50 MCG (2000 UT) tablet Take 4,000 Units by mouth daily.        Follow-up Information     Lurline Del, DO. Schedule an appointment as soon as possible for a visit in 1 week(s).   Specialty: Family Medicine Contact information: [address] [address] [phone]         [name], MD .   Specialty: Cardiology Contact  information: [phone] [place] [address] [phone]                Allergies  Allergen Reactions   Bupropion Other (See Comments)    Throat soreness and difficulty swallowing.    Norvasc [Amlodipine] Swelling    Significant leg swelling with 41m. Able to tolerate 2.572m     Consultations: None   Procedures/Studies: CT ABDOMEN PELVIS WO CONTRAST  Result Date: 03/17/2022 CLINICAL DATA:  Flank pain, kidney stone suspected. EXAM: CT ABDOMEN AND PELVIS WITHOUT CONTRAST TECHNIQUE: Multidetector CT imaging of the abdomen and pelvis was performed following the standard protocol without IV contrast. RADIATION DOSE REDUCTION: This exam was performed according to the departmental dose-optimization program which includes automated exposure control, adjustment of the mA and/or kV according to patient size and/or use of iterative reconstruction technique. COMPARISON:  CT examination dated July 29, 2019 FINDINGS: Lower chest: No acute abnormality. Hepatobiliary: Multiple small hypodense structures scattered throughout the liver likely cysts. Status  post cholecystectomy. No biliary dilatation. Pancreas: Unremarkable. No pancreatic ductal dilatation or surrounding inflammatory changes. Spleen: Normal in size without focal abnormality. Adrenals/Urinary Tract: Small bilateral adrenal adenoma measuring less than 10 Hounsfield units in density. Kidneys are normal, without renal calculi, focal lesion, or hydronephrosis. Bladder is unremarkable. Stomach/Bowel: Stomach is within normal limits. Appendix appears normal. No evidence of bowel wall thickening, distention, or inflammatory changes. Scattered colonic diverticula without evidence of acute diverticulitis. Vascular/Lymphatic: Aortic atherosclerosis. No enlarged abdominal or pelvic lymph nodes. Reproductive: Uterus and bilateral adnexa are unremarkable. Other: No abdominal wall hernia or abnormality. No abdominopelvic ascites.  Musculoskeletal: Mild multilevel degenerative disc disease of the lumbar spine. No acute osseous abnormality. Mild bilateral hip osteoarthritis. IMPRESSION: 1. No evidence of nephrolithiasis or hydronephrosis. No perinephric fat stranding. Urinalysis could be considered for further evaluation if clinical concern for urinary tract infection. 2.  Stable bilateral small adrenal adenomas. 3. Scattered colonic diverticula without evidence of acute diverticulitis. Normal appendix. 4. Multilevel degenerative disc disease of the lumbar spine. No acute osseous abnormality. 5.  No CT evidence of acute abdominal/pelvic process. Electronically Signed   By: Keane Police D.O.   On: 03/17/2022 12:52   DG CHEST PORT 1 VIEW  Result Date: 03/16/2022 CLINICAL DATA:  UTI EXAM: PORTABLE CHEST 1 VIEW COMPARISON:  05/14/2021 FINDINGS: The heart size and mediastinal contours are within normal limits. Both lungs are clear. The visualized skeletal structures are unremarkable. IMPRESSION: No acute abnormality of the lungs in AP portable projection. Electronically Signed   By: Delanna Ahmadi M.D.   On: 03/16/2022 17:56      Subjective: Patient seen and examined at bedside.  Feels much better and wants to go home today.  No overnight fever, nausea, vomiting or worsening shortness of breath reported.  Discharge Exam: Vitals:   03/18/22 0801 03/18/22 0813  BP:  (!) 162/104  Pulse:  75  Resp:    Temp:    SpO2: 98%     General: Pt is alert, awake, not in acute distress.  Currently on room air. Cardiovascular: rate controlled, S1/S2 + Respiratory: bilateral decreased breath sounds at bases with some scattered crackles Abdominal: Soft, morbidly obese, NT, ND, bowel sounds + Extremities: Trace lower extremity edema; no cyanosis    The results of significant diagnostics from this hospitalization (including imaging, microbiology, ancillary and laboratory) are listed below for reference.     Microbiology: Recent Results  (from the past 240 hour(s))  Urine Culture     Status: Abnormal   Collection Time: 03/16/22  1:48 PM   Specimen: Urine, Clean Catch  Result Value Ref Range Status   Specimen Description   Final    URINE, CLEAN CATCH Performed at [facility], [address] [place], [place] [address]    Special Requests   Final    NONE Performed at [facility], [place], [place] [address]    Culture (A)  Final    >=100,000 COLONIES/mL ESCHERICHIA COLI 40,000 COLONIES/mL KLEBSIELLA PNEUMONIAE Confirmed Extended Spectrum Beta-Lactamase Producer (ESBL).  In bloodstream infections from ESBL organisms, carbapenems are preferred over piperacillin/tazobactam. They are shown to have a lower risk of mortality.    Report Status 03/18/2022 FINAL  Final   Organism ID, Bacteria ESCHERICHIA COLI (A)  Final   Organism ID, Bacteria KLEBSIELLA PNEUMONIAE (A)  Final      Susceptibility   Escherichia coli - MIC*    AMPICILLIN >=32 RESISTANT Resistant     CEFAZOLIN >=64 RESISTANT Resistant  CEFEPIME 16 RESISTANT Resistant     CEFTRIAXONE >=64 RESISTANT Resistant     CIPROFLOXACIN 0.5 INTERMEDIATE Intermediate     GENTAMICIN <=1 SENSITIVE Sensitive     IMIPENEM <=0.25 SENSITIVE Sensitive     NITROFURANTOIN <=16 SENSITIVE Sensitive     TRIMETH/SULFA <=20 SENSITIVE Sensitive     AMPICILLIN/SULBACTAM >=32 RESISTANT Resistant     PIP/TAZO <=4 SENSITIVE Sensitive     * >=100,000 COLONIES/mL ESCHERICHIA COLI   Klebsiella pneumoniae - MIC*    AMPICILLIN >=32 RESISTANT Resistant     CEFAZOLIN <=4 SENSITIVE Sensitive     CEFEPIME <=0.12 SENSITIVE Sensitive     CEFTRIAXONE <=0.25 SENSITIVE Sensitive     CIPROFLOXACIN <=0.25 SENSITIVE Sensitive     GENTAMICIN <=1 SENSITIVE Sensitive     IMIPENEM <=0.25 SENSITIVE Sensitive     NITROFURANTOIN <=16 SENSITIVE Sensitive     TRIMETH/SULFA <=20 SENSITIVE Sensitive     AMPICILLIN/SULBACTAM 4 SENSITIVE Sensitive      PIP/TAZO <=4 SENSITIVE Sensitive     * 40,000 COLONIES/mL KLEBSIELLA PNEUMONIAE     Labs: BNP (last 3 results) No results for input(s): BNP in the last 8760 hours. Basic Metabolic Panel: Recent Labs  Lab 03/16/22 1304 03/17/22 0320 03/18/22 0321  NA 138 140 140  K 3.5 3.4* 3.5  CL 105 107 105  CO2 25 27 27   GLUCOSE 103* 89 94  BUN 11 12 12   CREATININE 0.89 0.88 0.81  CALCIUM 9.3 9.0 9.0  MG  --   --  2.4   Liver Function Tests: Recent Labs  Lab 03/17/22 0320 03/18/22 0321  AST 16 20  ALT 19 20  ALKPHOS 80 77  BILITOT 0.5 0.9  PROT 6.4* 7.0  ALBUMIN 3.4* 3.8   No results for input(s): LIPASE, AMYLASE in the last 168 hours. No results for input(s): AMMONIA in the last 168 hours. CBC: Recent Labs  Lab 03/16/22 1304 03/17/22 0320 03/18/22 0321  WBC 12.7* 10.3 9.5  HGB 15.9* 15.1* 15.7*  HCT 48.4* 46.1* 48.7*  MCV 86.7 88.7 87.4  PLT 209 188 208   Cardiac Enzymes: No results for input(s): CKTOTAL, CKMB, CKMBINDEX, TROPONINI in the last 168 hours. BNP: Invalid input(s): POCBNP CBG: Recent Labs  Lab 03/17/22 0750 03/17/22 1145 03/17/22 1617 03/17/22 2131 03/18/22 0736  GLUCAP 97 103* 100* 114* 114*   D-Dimer No results for input(s): DDIMER in the last 72 hours. Hgb A1c Recent Labs    03/16/22 1708  HGBA1C 6.3*   Lipid Profile No results for input(s): CHOL, HDL, LDLCALC, TRIG, CHOLHDL, LDLDIRECT in the last 72 hours. Thyroid function studies No results for input(s): TSH, T4TOTAL, T3FREE, THYROIDAB in the last 72 hours.  Invalid input(s): FREET3 Anemia work up No results for input(s): VITAMINB12, FOLATE, FERRITIN, TIBC, IRON, RETICCTPCT in the last 72 hours. Urinalysis    Component Value Date/Time   COLORURINE YELLOW 03/16/2022 1345   APPEARANCEUR HAZY (A) 03/16/2022 1345   LABSPEC 1.012 03/16/2022 1345   PHURINE 6.0 03/16/2022 1345   GLUCOSEU NEGATIVE 03/16/2022 1345   HGBUR MODERATE (A) 03/16/2022 1345   HGBUR small 06/27/2010 1409    BILIRUBINUR NEGATIVE 03/16/2022 1345   BILIRUBINUR negative 02/24/2022 1000   BILIRUBINUR NEG 09/18/2016 1013   KETONESUR NEGATIVE 03/16/2022 1345   PROTEINUR NEGATIVE 03/16/2022 1345   UROBILINOGEN 0.2 02/24/2022 1000   UROBILINOGEN 0.2 12/07/2010 0202   NITRITE NEGATIVE 03/16/2022 1345   LEUKOCYTESUR LARGE (A) 03/16/2022 1345   Sepsis Labs Invalid input(s): PROCALCITONIN,  WBC,  LACTICIDVEN  Microbiology Recent Results (from the past 240 hour(s))  Urine Culture     Status: Abnormal   Collection Time: 03/16/22  1:48 PM   Specimen: Urine, Clean Catch  Result Value Ref Range Status   Specimen Description   Final    URINE, CLEAN CATCH Performed at [facility], [address], [place], [place] [address]    Special Requests   Final    NONE Performed at [facility], [place], [place] [address]    Culture (A)  Final    >=100,000 COLONIES/mL ESCHERICHIA COLI 40,000 COLONIES/mL KLEBSIELLA PNEUMONIAE Confirmed Extended Spectrum Beta-Lactamase Producer (ESBL).  In bloodstream infections from ESBL organisms, carbapenems are preferred over piperacillin/tazobactam. They are shown to have a lower risk of mortality.    Report Status 03/18/2022 FINAL  Final   Organism ID, Bacteria ESCHERICHIA COLI (A)  Final   Organism ID, Bacteria KLEBSIELLA PNEUMONIAE (A)  Final      Susceptibility   Escherichia coli - MIC*    AMPICILLIN >=32 RESISTANT Resistant     CEFAZOLIN >=64 RESISTANT Resistant     CEFEPIME 16 RESISTANT Resistant     CEFTRIAXONE >=64 RESISTANT Resistant     CIPROFLOXACIN 0.5 INTERMEDIATE Intermediate     GENTAMICIN <=1 SENSITIVE Sensitive     IMIPENEM <=0.25 SENSITIVE Sensitive     NITROFURANTOIN <=16 SENSITIVE Sensitive     TRIMETH/SULFA <=20 SENSITIVE Sensitive     AMPICILLIN/SULBACTAM >=32 RESISTANT Resistant     PIP/TAZO <=4 SENSITIVE Sensitive     * >=100,000 COLONIES/mL ESCHERICHIA COLI   Klebsiella pneumoniae -  MIC*    AMPICILLIN >=32 RESISTANT Resistant     CEFAZOLIN <=4 SENSITIVE Sensitive     CEFEPIME <=0.12 SENSITIVE Sensitive     CEFTRIAXONE <=0.25 SENSITIVE Sensitive     CIPROFLOXACIN <=0.25 SENSITIVE Sensitive     GENTAMICIN <=1 SENSITIVE Sensitive     IMIPENEM <=0.25 SENSITIVE Sensitive     NITROFURANTOIN <=16 SENSITIVE Sensitive     TRIMETH/SULFA <=20 SENSITIVE Sensitive     AMPICILLIN/SULBACTAM 4 SENSITIVE Sensitive     PIP/TAZO <=4 SENSITIVE Sensitive     * 40,000 COLONIES/mL KLEBSIELLA PNEUMONIAE     Time coordinating discharge: 35 minutes  SIGNED:   Aline August, MD  [facility] 03/18/2022, 10:02 AM

## 2022-03-18 NOTE — Plan of Care (Signed)

## 2022-03-18 NOTE — TOC Transition Note (Signed)
Transition of Care [facility]) - CM/SW Discharge Note   Patient Details  Name: Hailey Barnes MRN: [id number] Date of Birth: April 30, 1961  Transition of Care [facility]) CM/SW Contact:  Leeroy Cha, RN Phone Number: 03/18/2022, 12:07 PM   Clinical Narrative:    Patient dcd to return home no toc needs   Final next level of care: Home/Self Care Barriers to Discharge: No Barriers Identified   Patient Goals and CMS Choice Patient states their goals for this hospitalization and ongoing recovery are:: to return to my home CMS Medicare.gov Compare Post Acute Care list provided to:: Patient    Discharge Placement                       Discharge Plan and Services   Discharge Planning Services: CM Consult                                 Social Determinants of Health (SDOH) Interventions     Readmission Risk Interventions     View : No data to display.

## 2022-03-18 NOTE — Progress Notes (Signed)
Patient discharged home.  Discharge instructions explained, patient verbalizes understanding.

## 2022-03-19 ENCOUNTER — Encounter: Payer: Self-pay | Admitting: Family Medicine

## 2022-03-19 ENCOUNTER — Telehealth: Payer: Self-pay

## 2022-03-19 NOTE — Telephone Encounter (Signed)
Transition Care Management Follow-up Telephone Call Date of discharge and from where: 03/18/22 Hailey Barnes How have you been since you were released from the hospital? Doing ok a little sore. Urinating Fine no problems Any questions or concerns? No  Items Reviewed: Did the pt receive and understand the discharge instructions provided? Yes  Medications obtained and verified? Yes  Other? No  Any new allergies since your discharge? No  Dietary orders reviewed? Yes Do you have support at home? Yes   Home Care and Equipment/Supplies: Were home health services ordered? no If so, what is the name of the agency? N/a  Has the agency set up a time to come to the patient's home? no Were any new equipment or medical supplies ordered?  No What is the name of the medical supply agency? N/a Were you able to get the supplies/equipment? not applicable Do you have any questions related to the use of the equipment or supplies? No  Functional Questionnaire: (I = Independent and D = Dependent) ADLs: I  Bathing/Dressing- I  Meal Prep- I  Eating- I  Maintaining continence- I  Transferring/Ambulation- I  Managing Meds- I  Follow up appointments reviewed:  PCP Hospital f/u appt confirmed? Yes  Scheduled to see Dr Vanessa [place] on 03/26/22 @ 310 pm. [facility] f/u appt confirmed?  We were unable to get through due to call volume but her number was put in for them to call her back at Dr. Marlou Porch office  Scheduled to see N/a on N/a @ N/a. Are transportation arrangements needed? No  If their condition worsens, is the pt aware to call PCP or go to the Emergency Dept.? Yes Was the patient provided with contact information for the PCP's office or ED? Yes Was to pt encouraged to call back with questions or concerns? Yes   Lazaro Arms RN, BSN, [facility] Care Management Coordinator Transition of Care  Phone: [phone]

## 2022-03-19 NOTE — Telephone Encounter (Signed)
Transition Care Management Unsuccessful Follow-up Telephone Call  Date of discharge and from where:  03/18/22 Hailey Barnes  Attempts:  1st Attempt  Reason for unsuccessful TCM follow-up call:  Left voice message  Lazaro Arms RN, BSN, [facility] Care Management Coordinator Transition of Care  Phone: [phone]

## 2022-03-24 ENCOUNTER — Ambulatory Visit: Payer: Medicare Other | Admitting: Physician Assistant

## 2022-03-24 NOTE — Progress Notes (Deleted)
Cardiology Office Note:    Date:  03/24/2022   ID:  [name], [name] September 01, 1961, MRN [id number]  PCP:  Lurline Del, DO  [facility] Providers Cardiologist:  Candee Furbish, MD { Click to update primary MD,subspecialty MD or APP then REFRESH:1}  *** Referring MD: Lurline Del, DO   Chief Complaint:  No chief complaint on file. {Click here for Visit Info    :1}   Patient Profile: Paroxysmal Supraventricular Tachycardia  Hx of sinus bradycardia w nocturnal pause of 4.4 sec Ok on Pindolol Mitral regurgitation   Echo 10/2020: Moderate MR Echo 10/2021: Trivial MR (HFpEF) heart failure with preserved ejection fraction  Copd Diabetes mellitus  Hypertension with LVH MVP  Sleep apnea Hx of CVA   Prior CV Studies: {Select studies to display:26339}  Echocardiogram 10/30/2021 EF 65-70, no RWMA, mild LVH, G1 DD, normal RVSF, RVSP 29.6, normal PASP, trivial MR  CCTA 01/17/2021 CAC score 0 No evidence of CAD Aortic atherosclerosis Dilated main pulmonary artery to 35 mm-suggestive of pulmonary hypertension  Echocardiogram 11/01/2020 EF 70-75, GR 1 DD, moderate MR, RVSP 33  Cardiac catheterization 03/31/2014 LAD luminal irregularities otherwise normal coronary arteries LVEDP 19  History of Present Illness:   Hailey Barnes is a 61 y.o. female with the above problem list.  She ***    She was admitted 5/28-5/30 for a UTI.  She was treated with IV antibiotics and transition to oral antibiotics.  She did note chest pain upon admission.  Her high sensitivity troponins remained negative without significant delta.  Electrocardiogram demonstrated sinus rhythm without acute ST-T wave changes.  She returns for cardiology follow-up    Past Medical History:  Diagnosis Date   Arthritis    "knees" (02/21/2014), back   CHF (congestive heart failure) ([place])    Chronic bronchitis ([place])    "get it q yr" (02/21/2014)   Chronic diastolic CHF (congestive heart failure) ([place])    a. Echo  10/23/2016: EF 75 %   Chronic lower back pain    Complication of anesthesia    "I don't come out well; I chew on my tongue"   COPD (chronic obstructive pulmonary disease) ([place])    Diabetes mellitus without complication ([place])    TYPE 2   Family history of adverse reaction to anesthesia    BROTHER PONV, had a blood clot several days later and heart stopped   Fatty liver    NONALCOLIC   Fibroid    Heart murmur    Hypertension    Infection of right eye    current dx on Saturday 7/15 - using drops   Leg swelling    bilateral   Migraine 1982-2009   MVP (mitral valve prolapse)    Neuromuscular disorder (HCC)    right leg nerve pain   Shortness of breath    WITH EXERTION   Sinus pause    a. noted on telemetry during admission from 10/2016, lasting up to 4.4 seconds. BB discontinued.    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   D/Dx: carpal tunnel, ulnar neuropathy Less likely: cervical radiculopathy, vasculitis    Current Medications: No outpatient medications have been marked as taking for  the 03/24/22 encounter (Appointment) with Liliane Shi, PA-C.    Allergies:   Bupropion and Norvasc [amlodipine]   Social History   Tobacco Use   Smoking status: Every Day    Packs/day: 0.50    Years: 42.00    Pack years: 21.00    Types: Cigarettes    Start date: 10/20/1976   Smokeless tobacco: Never   Tobacco comments:    Previous 1 ppd smoker x > 40 years.  Now smokes 10 cigarettes per day  Vaping Use   Vaping Use: Never used  Substance Use Topics   Alcohol use: Yes    Comment: very occasional   Drug use: Yes    Types: "Crack" cocaine, Marijuana    Comment: 03/30/2014 "stopped using crack 02/21/2014"    Family  Hx: The patient's family history includes COPD in her mother and sister; Cancer in her sister; Hypertension in her father, mother, sister, and sister.  ROS   EKGs/Labs/Other Test Reviewed:    EKG:  EKG is *** ordered today.  The ekg ordered today demonstrates ***  Recent Labs: 03/18/2022: ALT 20; BUN 12; Creatinine, Ser 0.81; Hemoglobin 15.7; Magnesium 2.4; Platelets 208; Potassium 3.5; Sodium 140   Recent Lipid Panel No results for input(s): CHOL, TRIG, HDL, VLDL, LDLCALC, LDLDIRECT in the last 8760 hours.   Risk Assessment/Calculations:   {Does this patient have ATRIAL FIBRILLATION?:[id number]}     Physical Exam:    VS:  LMP 04/06/2017     Wt Readings from Last 3 Encounters:  03/16/22 257 lb 15 oz (117 kg)  02/24/22 260 lb (117.9 kg)  02/05/22 258 lb 9.6 oz (117.3 kg)    Physical Exam ***     ASSESSMENT & PLAN:   No problem-specific Assessment & Plan notes found for this encounter.        {Are you ordering a CV Procedure (e.g. stress test, cath, DCCV, TEE, etc)?   Press F2        :[id number]}  Dispo:  No follow-ups on file.   Medication Adjustments/Labs and Tests Ordered: Current medicines are reviewed at length with the patient today.  Concerns regarding medicines are outlined above.  Tests Ordered: No orders of the defined types were placed in this encounter.  Medication Changes: No orders of the defined types were placed in this encounter.  Signed, Richardson Dopp, PA-C  03/24/2022 9:17 AM    [facility] [address], [place], [place]  [address] Phone: [phone]; Fax: [phone]

## 2022-03-25 ENCOUNTER — Encounter: Payer: Self-pay | Admitting: *Deleted

## 2022-03-26 ENCOUNTER — Encounter: Payer: Self-pay | Admitting: Family Medicine

## 2022-03-26 ENCOUNTER — Ambulatory Visit (INDEPENDENT_AMBULATORY_CARE_PROVIDER_SITE_OTHER): Payer: Medicare Other | Admitting: Family Medicine

## 2022-03-26 VITALS — BP 137/80 | HR 84 | Ht 62.0 in | Wt 258.0 lb

## 2022-03-26 DIAGNOSIS — Z1612 Extended spectrum beta lactamase (ESBL) resistance: Secondary | ICD-10-CM

## 2022-03-26 DIAGNOSIS — Z09 Encounter for follow-up examination after completed treatment for conditions other than malignant neoplasm: Secondary | ICD-10-CM

## 2022-03-26 DIAGNOSIS — B9629 Other Escherichia coli [E. coli] as the cause of diseases classified elsewhere: Secondary | ICD-10-CM

## 2022-03-26 DIAGNOSIS — N390 Urinary tract infection, site not specified: Secondary | ICD-10-CM

## 2022-03-26 MED ORDER — ESTROGENS CONJUGATED 0.625 MG/GM VA CREA: 1.0000 | TOPICAL_CREAM | Freq: Every day | VAGINAL | 12 refills | Status: DC

## 2022-03-26 NOTE — Progress Notes (Signed)
    SUBJECTIVE:   CHIEF COMPLAINT / HPI:   Hospital follow-up: 61 year old female present for follow-up after being admitted to the hospital on 03/16/2022 due to UTI with the need for IV antibiotics.  She was recently treated outpatient for ESBL E. coli UTI with Augmentin with which showed sensitive and sensitivities.  She stated that she felt better but then her symptoms recurred.  She was given IV meropenem and infectious disease was curb sided.  At time of discharge she was given 1 dose of oral fosfomycin with recommendation to follow-up for repeat CBC/BMP in 1 week.  Today she states her symptoms have resolved and she feels much better.  She states that she was initially feeling better after starting the Augmentin but after a few days her symptoms worsened prompting her to go to the emergency department.  She states she is doing much better at this time.  PERTINENT  PMH / PSH: History of recurrent ESBL UTIs  OBJECTIVE:   BP 137/80   Pulse 84   Ht 5' 2"  (1.575 m)   Wt 258 lb (117 kg)   LMP 04/06/2017   SpO2 96%   BMI 47.19 kg/m    General: NAD, pleasant, able to participate in exam Cardiac: RRR, no murmurs. Respiratory: CTAB, normal effort, No wheezes, rales or rhonchi Abdomen: Bowel sounds present, no suprapubic discomfort to palpation, nontender abdomen to palpation Neuro: alert, no obvious focal deficits Psych: Normal affect and mood  ASSESSMENT/PLAN:   Follow-up-hospitalization for resistant UTI: Symptoms resolved.  We will check a CBC and a BMP as recommended in the discharge summary.  Discussed modalities to reduce her likelihood of future UTIs including hygiene, fully emptying bladder, and discussed vaginal estrogens.  Ultimately decided to initiate Premarin cream as this may reduce the frequency of her urinary tract infections.  Discussed in detail that if she has further UTI symptoms in the future and presents to the clinic that we should check a urine culture before  ordering any antibiotics and that she may end up needing IV antibiotics in the future given the limitations on her previous sensitivities from previous urine cultures.  She expressed her understanding of this.  Follow-up as needed  Lurline Del, [place]

## 2022-03-26 NOTE — Assessment & Plan Note (Signed)
Presents for follow-up after recent hospitalization for the above.  Symptoms resolved.  We will check a CBC and a BMP as recommended in the discharge summary.  Discussed modalities to reduce her likelihood of future UTIs including hygiene, fully emptying bladder, and discussed vaginal estrogens.  Ultimately decided to initiate Premarin cream as this may reduce the frequency of her urinary tract infections.  Discussed in detail that if she has further UTI symptoms in the future and presents to the clinic that we should check a urine culture before ordering any antibiotics and that she may end up needing IV antibiotics in the future given the limitations on her previous sensitivities from previous urine cultures.  She expressed her understanding of this.  Follow-up as needed

## 2022-03-27 DIAGNOSIS — R928 Other abnormal and inconclusive findings on diagnostic imaging of breast: Secondary | ICD-10-CM | POA: Diagnosis not present

## 2022-03-27 LAB — CBC
Hematocrit: 47.3 % — ABNORMAL HIGH (ref 34.0–46.6)
Hemoglobin: 16.2 g/dL — ABNORMAL HIGH (ref 11.1–15.9)
MCH: 29 pg (ref 26.6–33.0)
MCHC: 34.2 g/dL (ref 31.5–35.7)
MCV: 85 fL (ref 79–97)
Platelets: 233 10*3/uL (ref 150–450)
RBC: 5.59 x10E6/uL — ABNORMAL HIGH (ref 3.77–5.28)
RDW: 14.1 % (ref 11.7–15.4)
WBC: 11.8 10*3/uL — ABNORMAL HIGH (ref 3.4–10.8)

## 2022-03-27 LAB — BASIC METABOLIC PANEL
BUN/Creatinine Ratio: 12 (ref 9–23)
BUN: 11 mg/dL (ref 6–24)
CO2: 25 mmol/L (ref 20–29)
Calcium: 9.7 mg/dL (ref 8.7–10.2)
Chloride: 104 mmol/L (ref 96–106)
Creatinine, Ser: 0.92 mg/dL (ref 0.57–1.00)
Glucose: 83 mg/dL (ref 70–99)
Potassium: 3.8 mmol/L (ref 3.5–5.2)
Sodium: 143 mmol/L (ref 134–144)
eGFR: 72 mL/min/{1.73_m2} (ref >59)
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
## 2022-03-28 ENCOUNTER — Ambulatory Visit: Payer: Self-pay

## 2022-03-28 ENCOUNTER — Encounter: Payer: Self-pay | Admitting: Family Medicine

## 2022-03-28 ENCOUNTER — Ambulatory Visit (INDEPENDENT_AMBULATORY_CARE_PROVIDER_SITE_OTHER): Payer: Medicare Other | Admitting: Family Medicine

## 2022-03-28 VITALS — BP 142/88 | HR 78 | Ht 62.0 in | Wt 259.4 lb

## 2022-03-28 DIAGNOSIS — E1169 Type 2 diabetes mellitus with other specified complication: Secondary | ICD-10-CM

## 2022-03-28 DIAGNOSIS — G8929 Other chronic pain: Secondary | ICD-10-CM

## 2022-03-28 DIAGNOSIS — M25561 Pain in right knee: Secondary | ICD-10-CM

## 2022-03-28 DIAGNOSIS — M25562 Pain in left knee: Secondary | ICD-10-CM | POA: Diagnosis not present

## 2022-03-28 NOTE — Patient Instructions (Addendum)
Good to see you today.  You had B knee injections.  Call or go to the ER if you develop a large red swollen joint with extreme pain or oozing puss.   Wegovy or Ozempic.  Schedule w/ one of the primary care doctors upstairs  Follow-up: as needed

## 2022-04-01 ENCOUNTER — Telehealth: Payer: Self-pay

## 2022-04-01 ENCOUNTER — Telehealth: Payer: Medicare Other

## 2022-04-01 NOTE — Telephone Encounter (Signed)
   RN Case Manager Care Management   Phone Outreach    04/01/2022 Name: Hailey Barnes MRN: [id number] DOB: 09/29/1961  [name] is a 61 y.o. year old female who is a primary care patient of Lurline Del, DO .   Telephone outreach was unsuccessful A HIPPA compliant phone message was left for the patient providing contact information and requesting a return call.   Follow Up Plan: Will route chart to Care Guide to see if patient would like to reschedule phone appointment.    Review of patient status, including review of consultants reports, relevant laboratory and other test results, and collaboration with appropriate care team members and the patient's provider was performed as part of comprehensive patient evaluation and provision of care management services.    Lazaro Arms RN, BSN, [facility] Care Management Coordinator [place] Phone: [phone] Fax: [phone]

## 2022-04-08 ENCOUNTER — Other Ambulatory Visit: Payer: Self-pay | Admitting: Family Medicine

## 2022-04-08 DIAGNOSIS — E119 Type 2 diabetes mellitus without complications: Secondary | ICD-10-CM

## 2022-04-11 ENCOUNTER — Telehealth: Payer: Self-pay

## 2022-04-11 NOTE — Chronic Care Management (AMB) (Signed)
  Care Coordination Note  04/11/2022 Name: [name] MRN: [id number] DOB: 27-Feb-1961  Hailey Barnes is a 62 y.o. year old female who is a primary care patient of Jackelyn Poling, DO and is actively engaged with the care management team. I reached out to [name] by phone today to assist with re-scheduling a follow up visit with the RN Case Manager  Follow up plan: Patient declines further follow up and engagement by the care management team. Appropriate care team members and provider have been notified via electronic communication.   Penne Lash, [name] Care Guide, Embedded Care Coordination [facility]  [place], [place] [address] Direct Dial: [phone] [email or web address]: [email or web address]

## 2022-04-15 ENCOUNTER — Other Ambulatory Visit: Payer: Self-pay | Admitting: Student

## 2022-04-15 DIAGNOSIS — E790 Hyperuricemia without signs of inflammatory arthritis and tophaceous disease: Secondary | ICD-10-CM

## 2022-04-16 ENCOUNTER — Encounter: Payer: Self-pay | Admitting: Family Medicine

## 2022-04-17 NOTE — Progress Notes (Signed)
Hailey Barnes is a 61 y.o. female who presents to [address] at [facility] today for B thumb pain.  She was last seen by Dr. [name] on 03/28/22 for B knee pain and had B knee steroid injections.  Today, pt reports bilat wrists have been bothering her over the last month. Pt locates pain to the L 1st CMC joint w/ a "knot" along the anterior aspect of the L forearm. R wrist is painful w/ numbness through fingers 1-3, ongoing for 1 week.   Swelling: yes Grip strength: diminished, interfering w/ her ADL Aggravating factors: all ADLs Treatments tried: IBU,   Diagnostic testing: L hand XR- 04/24/21   Additionally she notes continued radiating pain down her left leg.  She has had prior epidural steroid injections for this and would like a repeat epidural steroid injection.  Pertinent review of systems: no fever or chills  Relevant historical information:.  Heart failure.  Diabetes.  Hypertension. Relevant social history until 2 months ago Ms. Savino was experiencing homelessness and was living in a friend's house.  Fortunately she now has her own place and is very thankful.  However her tenuous financial situation still impacts her ability to receive health care.  For example she cannot afford carpal tunnel surgery at this time.   Exam:  BP (!) 158/98   Pulse 85   Ht 5' 2"  (1.575 m)   Wt 253 lb 9.6 oz (115 kg)   LMP 04/06/2017   SpO2 95%   BMI 46.38 kg/m  General: Well Developed, well nourished, and in no acute distress.   MSK: Right wrist exam skin hypopigmentation volar wrist otherwise normal. Positive Tinel's at carpal tunnel.  Normal wrist motion intact strength.  Left forearm and wrist: Slight mass at left volar forearm. Volar wrist hypopigmentation otherwise normal. Tender palpation left volar forearm mass.  Nonpulsatile. Normal wrist motion and strength. Tender palpation at first [facility]. Normal thumb motion and strength.     Lab and Radiology  Results  Diagnostic Limited MSK Ultrasound of: Left forearm Palpable mass left forearm corresponds to forearm musculature.  No significant vascular structures are in this area.  Doubtful for superficial venous thrombosis.. Impression: Left forearm mass thought to be forearm musculature.  Procedure: Real-time Ultrasound Guided Injection of left first [facility] Device: Philips Affiniti 50G Images permanently stored and available for review in PACS Verbal informed consent obtained.  Discussed risks and benefits of procedure. Warned about infection, bleeding, hyperglycemia damage to structures among others. Patient expresses understanding and agreement Time-out conducted.   Noted no overlying erythema, induration, or other signs of local infection.   Skin prepped in a sterile fashion.   Local anesthesia: Topical Ethyl chloride.   With sterile technique and under real time ultrasound guidance: 0.5 mL of 80 mg/mL Depo-Medrol solution and 0.5 mL of lidocaine injected into first [place]. Fluid seen entering the joint capsule.   Completed without difficulty   Pain immediately resolved suggesting accurate placement of the medication.   Advised to call if fevers/chills, erythema, induration, drainage, or persistent bleeding.   Images permanently stored and available for review in the ultrasound unit.  Impression: Technically successful ultrasound guided injection.    Procedure: Real-time Ultrasound Guided hydrodissection median nerve right carpal tunnel Device: Philips Affiniti 50G Images permanently stored and available for review in PACS Verbal informed consent obtained.  Discussed risks and benefits of procedure. Warned about infection, bleeding, hyperglycemia damage to structures among others. Patient expresses understanding and agreement Time-out  conducted.   Noted no overlying erythema, induration, or other signs of local infection.   Skin prepped in a sterile fashion.   Local anesthesia: Topical  Ethyl chloride.   With sterile technique and under real time ultrasound guidance: 40 mg of Kenalog and 1 mL of lidocaine injected into carpal tunnel around right median nerve. Fluid seen entering the carpal tunnel.   Completed without difficulty   Pain immediately resolved suggesting accurate placement of the medication.   Advised to call if fevers/chills, erythema, induration, drainage, or persistent bleeding.   Images permanently stored and available for review in the ultrasound unit.  Impression: Technically successful ultrasound guided injection.       EXAM: LEFT HAND - COMPLETE 3+ VIEW   COMPARISON:  11/02/2017   FINDINGS: Osseous mineralization normal.   Joint space narrowing first MCP joint with significant cystic changes which are new since the prior exam.   This could reflect gout or less likely cystic degenerative changes.   Additional cyst identified at the proximal pole of the scaphoid.   No acute fracture dislocation.   Scattered degenerative changes of IP joints.   IMPRESSION: Cystic degenerative changes at first [facility] joint, favor gout.   Additional scattered degenerative changes as above with a small cyst at the proximal pole the scaphoid.     Electronically Signed   By: Lavonia Dana M.D.   On: 04/25/2021 09:56  EXAM: RIGHT HAND - COMPLETE 3+ VIEW   COMPARISON:  None.   FINDINGS: No fracture or malalignment. Mild degenerative change at the first PIP and MCP joints. Mild degenerative change of the second through fifth DIP joints. Minimal degenerative change at the first [facility] joint.   IMPRESSION: Mild arthritic changes involving the thumb and DIP joints of the second through fifth digits. No acute osseous abnormality     Electronically Signed   By: Donavan Foil M.D.   On: 11/02/2017 15:21    Assessment and Plan: 61 y.o. female with right carpal tunnel syndrome.  Repeat carpal tunnel injection today.  Continue night splint.  Left thumb  pain.  Exacerbation of DJD.  First CMC injection.  Left forearm soreness.  Physical exam is concerning for superficial venous thrombosis however ultrasound examination is less concerning.  Plan for stretching compression and Voltaren gel.  If not improving would recommend vascular ultrasound to evaluate more thoroughly for superficial venous thrombosis.  Left lumbar radiculopathy: Repeat epidural steroid injection.  PDMP not reviewed this encounter. Orders Placed This Encounter  Procedures   [place] LIMITED JOINT SPACE STRUCTURES UP BILAT(NO LINKED CHARGES)    Order Specific Question:   Reason for Exam (SYMPTOM  OR DIAGNOSIS REQUIRED)    Answer:   BL thumb inj    Order Specific Question:   Preferred imaging location?    Answer:   [place]   DG INJECT DIAG/THERA/INC NEEDLE/CATH/PLC EPI/LUMB/SAC W/IMG    Standing Status:   Future    Standing Expiration Date:   04/19/2023    Order Specific Question:   Reason for Exam (SYMPTOM  OR DIAGNOSIS REQUIRED)    Answer:   Repeat ESI left symptoms. Level and technique per radiology    Order Specific Question:   Is the patient pregnant?    Answer:   No    Order Specific Question:   Preferred Imaging Location?    Answer:   [facility]    Order Specific Question:   Radiology Contrast Protocol - do NOT remove file path  Answer:   \\[email or web address]   No orders of the defined types were placed in this encounter.    Discussed warning signs or symptoms. Please see discharge instructions. Patient expresses understanding.   The above documentation has been reviewed and is accurate and complete Lynne Leader, M.D.

## 2022-04-18 ENCOUNTER — Ambulatory Visit (INDEPENDENT_AMBULATORY_CARE_PROVIDER_SITE_OTHER): Payer: Medicare Other | Admitting: Family Medicine

## 2022-04-18 ENCOUNTER — Ambulatory Visit: Payer: Self-pay

## 2022-04-18 VITALS — BP 158/98 | HR 85 | Ht 62.0 in | Wt 253.6 lb

## 2022-04-18 DIAGNOSIS — M79642 Pain in left hand: Secondary | ICD-10-CM | POA: Diagnosis not present

## 2022-04-18 DIAGNOSIS — M1812 Unilateral primary osteoarthritis of first carpometacarpal joint, left hand: Secondary | ICD-10-CM | POA: Diagnosis not present

## 2022-04-18 DIAGNOSIS — R2232 Localized swelling, mass and lump, left upper limb: Secondary | ICD-10-CM | POA: Diagnosis not present

## 2022-04-18 DIAGNOSIS — M5416 Radiculopathy, lumbar region: Secondary | ICD-10-CM | POA: Diagnosis not present

## 2022-04-18 DIAGNOSIS — M79641 Pain in right hand: Secondary | ICD-10-CM

## 2022-04-18 DIAGNOSIS — G5601 Carpal tunnel syndrome, right upper limb: Secondary | ICD-10-CM

## 2022-04-18 NOTE — Patient Instructions (Addendum)
Thank you for coming in today.   You received an injection today. Seek immediate medical attention if the joint becomes red, extremely painful, or is oozing fluid.   Please call [facility] at [phone] to schedule your spine injection.    Use compression and heat and try voltaren gel for the forearm pain.  Do the stretching for the left forearm.   If not improving let me know.   We can arrange for a vascular ultrasound of the arm.

## 2022-04-26 ENCOUNTER — Other Ambulatory Visit: Payer: Self-pay | Admitting: Family Medicine

## 2022-04-26 DIAGNOSIS — J449 Chronic obstructive pulmonary disease, unspecified: Secondary | ICD-10-CM

## 2022-04-29 ENCOUNTER — Encounter: Payer: Self-pay | Admitting: Student

## 2022-04-29 ENCOUNTER — Other Ambulatory Visit: Payer: Self-pay

## 2022-04-29 ENCOUNTER — Ambulatory Visit (INDEPENDENT_AMBULATORY_CARE_PROVIDER_SITE_OTHER): Payer: Medicare Other | Admitting: Student

## 2022-04-29 VITALS — BP 141/83 | HR 75 | Wt 252.4 lb

## 2022-04-29 DIAGNOSIS — E1169 Type 2 diabetes mellitus with other specified complication: Secondary | ICD-10-CM

## 2022-04-29 DIAGNOSIS — J449 Chronic obstructive pulmonary disease, unspecified: Secondary | ICD-10-CM | POA: Diagnosis not present

## 2022-04-29 DIAGNOSIS — M180 Bilateral primary osteoarthritis of first carpometacarpal joints: Secondary | ICD-10-CM

## 2022-04-29 MED ORDER — SEMAGLUTIDE(0.25 OR 0.5MG/DOS) 2 MG/1.5ML ~~LOC~~ SOPN: 0.5000 mg | PEN_INJECTOR | SUBCUTANEOUS | 0 refills | Status: DC

## 2022-04-29 MED ORDER — TRELEGY ELLIPTA 200-62.5-25 MCG/ACT IN AEPB: 1.0000 | INHALATION_SPRAY | Freq: Every day | RESPIRATORY_TRACT | 3 refills | Status: DC

## 2022-04-29 NOTE — Assessment & Plan Note (Addendum)
Patient with severe bilateral knee osteoarthritis. Sees sports medicine for steroid injections to the knees every 4 months which has provided some relieve. Per chart review patient will eventual need knee replacement surgeries but would need to lose about 50lb more or BMI below 40.  She is currently on Trulicity and is informed she will need to switch to Ozempic or Mounjaro by her sports medicine provider.  Started patient on Ozempic 0.5 mg weekly with plans to slowly titrate up as tolerated. Follow up in 2-3 weeks. She is not due for A1c check today, last A1c was 6.3 two months ago.

## 2022-04-29 NOTE — Progress Notes (Addendum)
    SUBJECTIVE:   CHIEF COMPLAINT / HPI:   Patient with History of Obesity and significant bilateral knee and back  arthritis presents today to discuss treatment regime for weight loss. She gets bilateral steroid knee and back injections every 4 months. Recently discussed with sport medicine provider about need to loose about 50lbs or bring BMI below 40 to qualifiy for knee replacement surgery.  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 Sees sports medicine for steroid injections to the knees every 4 months which has provided some relieve. Per chart review patient will eventual need knee replacement surgeries but would need to lose about 50lb more or BMI below 40.  She is currently on Trulicity and is informed she will need to switch to Ozempic or Mounjaro by her sports medicine provider.  Started patient on Ozempic 0.5 mg weekly with plans to slowly titrate up as tolerated. Follow up in 2-3 weeks. She is not due for A1c check today, last A1c was 6.3 two months ago.     Alen Bleacher, MD [employer]

## 2022-04-29 NOTE — Patient Instructions (Addendum)
It was wonderful to meet you today. Thank you for allowing me to be a part of your care. Below is a short summary of what we discussed at your visit today:  Your last A1c was 2 months ago and I was 6.3  Prescribed Ozempic for 0.5 mg weekly. With plan to slowly go up in dose  Follow up in 2-3 weeks   If you have any questions or concerns, please do not hesitate to contact us via phone or MyChart message.   Alen Bleacher, MD [facility]

## 2022-05-06 ENCOUNTER — Ambulatory Visit
Admission: RE | Admit: 2022-05-06 | Discharge: 2022-05-06 | Disposition: A | Discharge status: Home / Self Care | Payer: Medicare Other | Source: Ambulatory Visit | Attending: Family Medicine | Admitting: Family Medicine

## 2022-05-06 DIAGNOSIS — M5416 Radiculopathy, lumbar region: Secondary | ICD-10-CM

## 2022-05-06 DIAGNOSIS — M47817 Spondylosis without myelopathy or radiculopathy, lumbosacral region: Secondary | ICD-10-CM | POA: Diagnosis not present

## 2022-05-06 MED ORDER — METHYLPREDNISOLONE ACETATE 40 MG/ML INJ SUSP (RADIOLOG
80.0000 mg | Freq: Once | INTRAMUSCULAR | Status: AC
  Administered 2022-05-06: 80 mg via EPIDURAL

## 2022-05-06 MED ORDER — IOPAMIDOL (ISOVUE-M 200) INJECTION 41%
1.0000 mL | Freq: Once | INTRAMUSCULAR | Status: AC
  Administered 2022-05-06: 1 mL via EPIDURAL

## 2022-05-06 NOTE — Discharge Instructions (Signed)

## 2022-05-09 ENCOUNTER — Ambulatory Visit: Payer: Medicare Other | Admitting: Nurse Practitioner

## 2022-05-13 ENCOUNTER — Other Ambulatory Visit: Payer: Self-pay | Admitting: Cardiology

## 2022-05-16 ENCOUNTER — Encounter: Payer: Self-pay | Admitting: Student

## 2022-05-16 ENCOUNTER — Ambulatory Visit (INDEPENDENT_AMBULATORY_CARE_PROVIDER_SITE_OTHER): Payer: Medicare Other | Admitting: Student

## 2022-05-16 VITALS — Ht 62.0 in | Wt 249.8 lb

## 2022-05-16 DIAGNOSIS — E1169 Type 2 diabetes mellitus with other specified complication: Secondary | ICD-10-CM

## 2022-05-16 DIAGNOSIS — R300 Dysuria: Secondary | ICD-10-CM

## 2022-05-16 LAB — POCT URINALYSIS DIP (MANUAL ENTRY)
Bilirubin, UA: NEGATIVE
Glucose, UA: NEGATIVE mg/dL
Ketones, POC UA: NEGATIVE mg/dL
Nitrite, UA: NEGATIVE
Protein Ur, POC: NEGATIVE mg/dL
Spec Grav, UA: 1.01 (ref 1.010–1.025)
Urobilinogen, UA: 0.2 E.U./dL
pH, UA: 5.5 (ref 5.0–8.0)

## 2022-05-16 LAB — POCT UA - MICROSCOPIC ONLY

## 2022-05-16 MED ORDER — CEPHALEXIN 500 MG PO CAPS: 500.0000 mg | ORAL_CAPSULE | Freq: Two times a day (BID) | ORAL | 0 refills | Status: AC

## 2022-05-16 NOTE — Progress Notes (Signed)
    SUBJECTIVE:   CHIEF COMPLAINT / HPI:   Diabetes 61 year old femal here for follow up after started Ozempic 2 weeks ago for diabetes management and weight loss.  Tolerating well without side effects.  Patient endorses compliance with these medications.  Patient's last A1c was 6.3   Dysuria Patient reports irritation with voiding that started 3 days ago. She has increased frequency. Sexually active with one partner. Denies any vaginal discharge, itchiness or odor.   PERTINENT  PMH / PSH: T2DM, HLD, osteoarthritis.  OBJECTIVE:   Ht 5' 2"  (1.575 m)   Wt 249 lb 12.8 oz (113.3 kg)   LMP 04/06/2017   BMI 45.69 kg/m     Physical Exam General: Alert, well appearing, NAD Cardiovascular: RRR Respiratory: Normal work of breathing on room air Abdomen: No distension or tenderness  ASSESSMENT/PLAN:   T2DM (type 2 diabetes mellitus) ([place]) Patient reports tolerance after 2 weeks on weekly 0.40m of  Ozempic.  Recommend patient continues on 0.5 mg of Ozempic for additional 2 weeks and if no side effects can increase to 1 mg weekly.    Dysuria Patient complaining of dysuria and increased urinary frequency in the last 3 days with suprapubic pain.  POCT dipstick was consistent with UTI.  Started patient on Keflex 500 mg twice daily for 5 days.   JAlen Bleacher MD [place]

## 2022-05-16 NOTE — Patient Instructions (Signed)
It was wonderful to meet you today. Thank you for allowing me to be a part of your care. Below is a short summary of what we discussed at your visit today:  Urine test Show you have UTI  Take Keflex 585m twice daily for 5 days.  Continue Ozempic 0.535mweekly and in two weeks we can go up to 65m765meekly.  If you have any questions or concerns, please do not hesitate to contact us [place] phone or MyChart message.   JohAlen BleacherD [facility]

## 2022-05-16 NOTE — Assessment & Plan Note (Signed)
Patient reports tolerance after 2 weeks on weekly 0.70m of  Ozempic.  Recommend patient continues on 0.5 mg of Ozempic for additional 2 weeks and if no side effects can increase to 1 mg weekly.

## 2022-05-21 ENCOUNTER — Encounter: Payer: Self-pay | Admitting: Podiatry

## 2022-05-21 ENCOUNTER — Ambulatory Visit (INDEPENDENT_AMBULATORY_CARE_PROVIDER_SITE_OTHER): Payer: Medicare Other | Admitting: Podiatry

## 2022-05-21 DIAGNOSIS — L84 Corns and callosities: Secondary | ICD-10-CM

## 2022-05-21 DIAGNOSIS — E119 Type 2 diabetes mellitus without complications: Secondary | ICD-10-CM | POA: Diagnosis not present

## 2022-05-21 NOTE — Progress Notes (Signed)
This patient returns to my office for at risk foot care.  This patient requires this care by a professional since this patient will be at risk due to having type 2 diabetes.   This patient is unable to trim painful callus under the right foot.  She has applied acid which has lessened her pain but there is some pain remaining.  This callus  is painful walking and wearing shoes.  This patient presents for at risk foot care today.  General Appearance  Alert, conversant and in no acute stress.  Vascular  Dorsalis pedis and posterior tibial  pulses are palpable  bilaterally.  Capillary return is within normal limits  bilaterally. Temperature is within normal limits  bilaterally.  Neurologic  Senn-Weinstein monofilament wire test within normal limits  bilaterally. Muscle power within normal limits bilaterally.  Nails Normal nails noted bilaterally. No evidence of bacterial infection or drainage bilaterally.  Orthopedic  No limitations of motion  feet .  No crepitus or effusions noted.  No bony pathology or digital deformities noted.  Skin  normotropic skin with  porokeratosis noted under her right foot.  No signs of infections or ulcers noted.     Callus right foot.  Consent was obtained for treatment procedures.   Debridement of callus with # 15 blade right foot. Filed with dremel without incident.    Return office visit   prn                  Told patient to return for periodic foot care and evaluation due to potential at risk complications.   Gardiner Barefoot DPM

## 2022-05-22 DIAGNOSIS — G4733 Obstructive sleep apnea (adult) (pediatric): Secondary | ICD-10-CM | POA: Diagnosis not present

## 2022-05-23 ENCOUNTER — Other Ambulatory Visit: Payer: Self-pay | Admitting: Cardiology

## 2022-05-24 ENCOUNTER — Other Ambulatory Visit: Payer: Self-pay | Admitting: Family Medicine

## 2022-05-28 ENCOUNTER — Telehealth: Payer: Self-pay | Admitting: *Deleted

## 2022-05-28 ENCOUNTER — Encounter (INDEPENDENT_AMBULATORY_CARE_PROVIDER_SITE_OTHER): Payer: Self-pay

## 2022-05-28 NOTE — Patient Outreach (Signed)
  Care Coordination   05/28/2022 Name: Hailey Barnes MRN: [id number] DOB: 1960/12/02   Care Coordination Outreach Attempts:  An unsuccessful telephone outreach was attempted today to offer the patient information about available care coordination services as a benefit of their health plan.   Follow Up Plan:  Additional outreach attempts will be made to offer the patient care coordination information and services.   Encounter Outcome:  No Answer  Care Coordination Interventions Activated:  No   Care Coordination Interventions:  No, not indicated    Eduard Clos MSW, LCSW Licensed Clinical Social Worker      [phone]

## 2022-05-29 ENCOUNTER — Ambulatory Visit (INDEPENDENT_AMBULATORY_CARE_PROVIDER_SITE_OTHER): Payer: Medicare Other | Admitting: Family Medicine

## 2022-05-29 VITALS — BP 143/97 | HR 84 | Ht 62.0 in | Wt 248.2 lb

## 2022-05-29 DIAGNOSIS — N390 Urinary tract infection, site not specified: Secondary | ICD-10-CM

## 2022-05-29 DIAGNOSIS — Z1612 Extended spectrum beta lactamase (ESBL) resistance: Secondary | ICD-10-CM

## 2022-05-29 DIAGNOSIS — R399 Unspecified symptoms and signs involving the genitourinary system: Secondary | ICD-10-CM

## 2022-05-29 DIAGNOSIS — B9629 Other Escherichia coli [E. coli] as the cause of diseases classified elsewhere: Secondary | ICD-10-CM | POA: Diagnosis not present

## 2022-05-29 LAB — POCT URINALYSIS DIP (MANUAL ENTRY)
Blood, UA: NEGATIVE
Glucose, UA: NEGATIVE mg/dL
Ketones, POC UA: NEGATIVE mg/dL
Nitrite, UA: NEGATIVE
Spec Grav, UA: 1.02 (ref 1.010–1.025)
Urobilinogen, UA: 0.2 E.U./dL
pH, UA: 5.5 (ref 5.0–8.0)

## 2022-05-29 LAB — POCT UA - MICROSCOPIC ONLY

## 2022-05-29 MED ORDER — NITROFURANTOIN MONOHYD MACRO 100 MG PO CAPS: 100.0000 mg | ORAL_CAPSULE | Freq: Two times a day (BID) | ORAL | 0 refills | Status: AC

## 2022-05-29 NOTE — Patient Instructions (Signed)
It was wonderful to see you today. Thank you for allowing me to be a part of your care. Below is a short summary of what we discussed at your visit today:  UTI I am going to start you on a medication called Macrobid.  You will take it twice a day for 7 days.  I have also sent your urine off for culture.  If we get the culture results back and Macrobid will not work on the bacteria you have currently, I will call you to change your medication.  If you develop bad kidney pain, fevers above 100.4 F, or worsening symptoms please return to the clinic for another evaluation.  Follow-up with your PCP in 1 to 2 weeks.  Health Maintenance We like to think about ways to keep you healthy for years to come. Below are some interventions and screenings we can offer to keep you healthy: -Tdap vaccine (available here anytime) -Colonoscopy (let us know and we can refer you to the GI office) -Routine diabetic eye exam (if you do not have a regular ophthalmologist, let us know and we can refer you to 1) -Shingles vaccine (can be obtained at your favorite pharmacy) -Flu vaccine (can be obtained here about early September, call to check before)  Please bring all of your medications to every appointment!  If you have any questions or concerns, please do not hesitate to contact us via phone or MyChart message.   Ezequiel Essex, MD

## 2022-05-29 NOTE — Progress Notes (Signed)
    SUBJECTIVE:   CHIEF COMPLAINT / HPI:   Dysuria Patient presents today for ongoing UTI symptoms.  She last presented to [facility] 7/28 with urinary frequency and dysuria, UA consistent with UTI.  She was prescribed Keflex 500 mg twice daily x 5 days.  She reports the symptoms improved somewhat, but have returned. She is experiencing dysuria, flank pain, slowed urine stream, and difficulty emptying bladder. She would like repeat testing.  Denies fever and chills. Does feel hot from time to time, attributes it to menopausal hot flashes.   She has a fairly significant history with recent UTIs.  Brief timeline below: 11/29/2021 at [facility], dysuria symptoms, started on nitrofurantoin - Later urine culture positive for E. coli with multidrug resistances, likely ESBL, ID consulted, treated with fosfomycin 3 g every other day x 3 doses - Jardiance discontinued 12/11/2021 at [facility], test of cure urine culture with no growth 02/24/2022 seen in [facility] for UTI, urine culture grew ESBL, treated with Augmentin 5/28-30 hospitalized at  [facility] for UTI, urien culture again showed ESBL and now klebsiella pneumoniae (relatively pan-sensitive), treated with IV meropenem then one dose oral fosfomycin on day of discharge  6/07 hospital follow up at [facility], premarin started to reduce chance of repeat UTI 7/28 [facility] visit for return UTI, rx keflex 500 BID x 5 days  PERTINENT  PMH / PSH: UTI due to ESBL requiring hospitalization May 2023, COPD, HLD, T2DM, HFpEF, NASH, HTN, tobacco use  OBJECTIVE:   BP (!) 143/97   Pulse 84   Ht 5' 2"  (1.575 m)   Wt 248 lb 4 oz (112.6 kg)   LMP 04/06/2017   SpO2 96%   BMI 45.41 kg/m    PHQ-9:     05/29/2022   10:38 AM 05/16/2022    2:26 PM 04/29/2022    1:26 PM  Depression screen PHQ 2/9  Decreased Interest 0 0 0  Down, Depressed, Hopeless 0 0 0  PHQ - 2 Score 0 0 0  Altered sleeping 1 3 3   Tired, decreased energy 0 0 2  Change in appetite 0 0 2  Feeling bad or failure about  yourself  0 0 0  Trouble concentrating 0 0 0  Moving slowly or fidgety/restless 0 0 0  Suicidal thoughts 0 0 0  PHQ-9 Score 1 3 7   Difficult doing work/chores Very difficult Somewhat difficult Somewhat difficult   Physical Exam General: Awake, alert, oriented, no acute distress Respiratory: Unlabored respirations, speaking in full sentences, no respiratory distress MSK: No CVA tenderness ABD: mild suprapubic TTP  ASSESSMENT/PLAN:   UTI due to extended-spectrum beta lactamase (ESBL) producing Escherichia coli Acute. Symptoms never full resolved after latest Keflex, however are worsening. Given complicated UTI history with recent hospitalization with ESBL UTI requiring meropenem, will obtain repeat urine culture. Last urine culture sensitivities demonstrated sensitivity to macrobid; given lack of pyelo symptoms and reassuring physical exam today, I believe it is safe to start there to treat for uncomplicated UTI. Will follow urine culture sensitivities and adjust antibiotics as necessary.     Ezequiel Essex, MD [place]

## 2022-05-29 NOTE — Progress Notes (Signed)
Dr. Jeani Hawking request pharmacy to counsel patient on Ozempic (semaglutide) administration. Patient had been counting to 5-6 clicks versus dialing up to the 0.25 mg dose. She had severe GI upset causing when she went too quickly to the 0.5 mg dose. Instructed patient to restart the 0.25 mg dose and f/up with PCP regarding dose increase.  Patient educated on purpose, proper use and potential adverse effects of Ozempic. Following instruction patient verbalized understanding of administration.   Joseph Art, Pharm.D. PGY-2 Ambulatory Care Pharmacy Resident 05/29/2022 2:01 PM

## 2022-05-30 DIAGNOSIS — R399 Unspecified symptoms and signs involving the genitourinary system: Secondary | ICD-10-CM | POA: Insufficient documentation

## 2022-05-30 NOTE — Assessment & Plan Note (Signed)
Acute. Symptoms never full resolved after latest Keflex, however are worsening. Given complicated UTI history with recent hospitalization with ESBL UTI requiring meropenem, will obtain repeat urine culture. Last urine culture sensitivities demonstrated sensitivity to macrobid; given lack of pyelo symptoms and reassuring physical exam today, I believe it is safe to start there to treat for uncomplicated UTI. Will follow urine culture sensitivities and adjust antibiotics as necessary.

## 2022-06-01 LAB — URINE CULTURE

## 2022-06-12 DIAGNOSIS — H02825 Cysts of left lower eyelid: Secondary | ICD-10-CM | POA: Diagnosis not present

## 2022-06-15 NOTE — Progress Notes (Unsigned)
    SUBJECTIVE:   CHIEF COMPLAINT / HPI:   61 y.o.  year old female presents with with extended history of recurrent UTI presents today for follow up following recent treatment for Klebsiella UTI treated with Macrobid. Urine culture  on 05/29/2022 grew pansensititve  Klebsiella  She has completed her Macrobid course and reports improvement of symptoms at the time but came back 3 days ago. She also endorses irritation with voiding, increased frequency and urgency.  She is sexually active with one partner and inconsistent use of protection. Denies vaginal discharge, itchiness or odor.  No hematuria or recent change in medication.  T2DM Patient reports she has been taking the wrong dose for her Ozempic due to poor understanding on how to use the pens.  Was recently seen by pharmacy who explained to her and she is currently on 0.25 weekly dose.  Still endorses mild nausea but much improved from previously.     PERTINENT  PMH / PSH: HTN, HFpEF, T2DM, recurrent UTI  OBJECTIVE:   BP 130/75   Pulse 75   Wt 248 lb (112.5 kg)   LMP 04/06/2017   SpO2 97%   BMI 45.36 kg/m    Physical Exam General: Alert, well appearing, NAD Cardiovascular: Well perfused Respiratory: Normal work of breathing Extremities: No edema on extremities     ASSESSMENT/PLAN:   T2DM (type 2 diabetes mellitus) ([place]) She reports some nausea and vomiting due to non-compliance to have Ozempic.  She has had improved symptoms since going down to 0.25 mg weekly and taking the appropriate dose. -Rx Zofran as needed -Discussed side effects of Ozempic and option to discontinue.  Patient will like to continue on lower dose.  Dysuria Patient's irritation with voiding could possibly be due to current UTI or painful intercourse due to postmenopausal vaginal dryness.  Dipstick stick was negative for UTI and microscopy was unremarkable. -Follow-up urine culture -Discussed option of postcoital antibiotics and urinary culture.      Alen Bleacher, MD [place]

## 2022-06-16 ENCOUNTER — Ambulatory Visit (INDEPENDENT_AMBULATORY_CARE_PROVIDER_SITE_OTHER): Payer: Medicare Other | Admitting: Student

## 2022-06-16 ENCOUNTER — Encounter: Payer: Self-pay | Admitting: Student

## 2022-06-16 VITALS — BP 130/75 | HR 75 | Wt 248.0 lb

## 2022-06-16 DIAGNOSIS — R399 Unspecified symptoms and signs involving the genitourinary system: Secondary | ICD-10-CM | POA: Diagnosis not present

## 2022-06-16 DIAGNOSIS — E1169 Type 2 diabetes mellitus with other specified complication: Secondary | ICD-10-CM

## 2022-06-16 LAB — POCT UA - MICROSCOPIC ONLY

## 2022-06-16 LAB — POCT URINALYSIS DIP (MANUAL ENTRY)
Bilirubin, UA: NEGATIVE
Blood, UA: NEGATIVE
Glucose, UA: NEGATIVE mg/dL
Ketones, POC UA: NEGATIVE mg/dL
Nitrite, UA: NEGATIVE
Protein Ur, POC: NEGATIVE mg/dL
Spec Grav, UA: 1.01 (ref 1.010–1.025)
Urobilinogen, UA: 0.2 E.U./dL
pH, UA: 6 (ref 5.0–8.0)

## 2022-06-16 MED ORDER — ONDANSETRON HCL 4 MG PO TABS: 4.0000 mg | ORAL_TABLET | Freq: Three times a day (TID) | ORAL | 0 refills | Status: AC | PRN

## 2022-06-16 NOTE — Assessment & Plan Note (Signed)
She reports some nausea and vomiting due to non-compliance to have Ozempic.  She has had improved symptoms since going down to 0.25 mg weekly and taking the appropriate dose. -Rx Zofran as needed -Discussed side effects of Ozempic and option to discontinue.  Patient will like to continue on lower dose.

## 2022-06-16 NOTE — Patient Instructions (Addendum)
It was wonderful to see you today. Thank you for allowing me to be a part of your care. Below is a short summary of what we discussed at your visit today:  Your urine test was negative for UTI.  However we will send in urine culture  and follow-up with you for the results.  I will follow-up with you pending the urine culture results on whether would need antibiotic after every intercourse.  I have sent an order for Zofran for your nausea.  Please bring all of your medications to every appointment!  If you have any questions or concerns, please do not hesitate to contact us via phone or MyChart message.   Alen Bleacher, MD [facility]

## 2022-06-21 ENCOUNTER — Other Ambulatory Visit: Payer: Self-pay | Admitting: Student

## 2022-06-21 DIAGNOSIS — E1169 Type 2 diabetes mellitus with other specified complication: Secondary | ICD-10-CM

## 2022-07-05 ENCOUNTER — Other Ambulatory Visit: Payer: Self-pay | Admitting: Cardiology

## 2022-07-07 NOTE — Telephone Encounter (Signed)
*  STAT* If patient is at the pharmacy, call can be transferred to refill team.   1. Which medications need to be refilled? (please list name of each medication and dose if known)   pindolol (VISKEN) 10 MG tablet  2. Which pharmacy/location (including street and city if local pharmacy) is medication to be sent to?[id number] [place], [place] - [address]. Do they need a 30 day or 90 day supply? 30 day  Patient is out of medication, she has appt for tomorrow.

## 2022-07-07 NOTE — Progress Notes (Deleted)
Office Visit    Patient Name: Hailey Barnes Date of Encounter: 07/07/2022  Primary Care Provider:  Alen Bleacher, MD Primary Cardiologist:  Candee Furbish, MD Primary Electrophysiologist: None  Chief Complaint    Hailey Barnes is a 61 y.o. female with PMH of HTN, COPD, HFpEF, tobacco abuse, migraines, SVT, nonobstructive CAD, CVA, DM II, who presents today for follow-up of hypertension and congestive heart failure.  Past Medical History    Past Medical History:  Diagnosis Date   Arthritis    "knees" (02/21/2014), back   CHF (congestive heart failure) ([place])    Chronic bronchitis ([place])    "get it q yr" (02/21/2014)   Chronic diastolic CHF (congestive heart failure) ([place])    a. Echo 10/23/2016: EF 75 %   Chronic lower back pain    Complication of anesthesia    "I don't come out well; I chew on my tongue"   COPD (chronic obstructive pulmonary disease) ([place])    Diabetes mellitus without complication ([place])    TYPE 2   Family history of adverse reaction to anesthesia    BROTHER PONV, had a blood clot several days later and heart stopped   Fatty liver    NONALCOLIC   Fibroid    Heart murmur    Hypertension    Infection of right eye    current dx on Saturday 7/15 - using drops   Leg swelling    bilateral   Migraine 1982-2009   MVP (mitral valve prolapse)    Neuromuscular disorder (H[facility])    right leg nerve pain   Shortness of breath    WITH EXERTION   Sinus pause    a. noted on telemetry during admission from 10/2016, lasting up to 4.4 seconds. BB discontinued.    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   D/Dx: carpal tunnel, ulnar neuropathy Less likely: cervical radiculopathy, vasculitis     Past Surgical History:  Procedure Laterality Date   CESAREAN SECTION  1982   CHOLECYSTECTOMY N/A 06/17/2017   Procedure: LAPAROSCOPIC CHOLECYSTECTOMY WITH INTRAOPERATIVE CHOLANGIOGRAM;  Surgeon: Donnie Mesa, MD;  Location: [place];  Service: General;  Laterality: N/A;   COLONOSCOPY WITH PROPOFOL N/A 04/28/2017   Procedure: COLONOSCOPY WITH PROPOFOL;  Surgeon: Manus Gunning, MD;  Location: [facility];  Service: Gastroenterology;  Laterality: N/A;   DILATATION & CURETTAGE/HYSTEROSCOPY WITH MYOSURE N/A 05/06/2016   Procedure: DILATATION & CURETTAGE/HYSTEROSCOPY WITH MYOSURE;  Surgeon: Terrance Mass, MD;  Location: [facility];  Service: Gynecology;  Laterality: N/A;  request to follow around 8:45  requests one hour OR time   [place] N/A 04/03/2017   Procedure: [place];  Surgeon: Terrance Mass, MD;  Location: [facility];  Service: Gynecology;  Laterality: N/A;   DILATION AND CURETTAGE OF UTERUS     ESOPHAGOGASTRODUODENOSCOPY (EGD) WITH PROPOFOL N/A 04/28/2017   Procedure: ESOPHAGOGASTRODUODENOSCOPY (EGD) WITH PROPOFOL;  Surgeon: Manus Gunning, MD;  Location: [facility];  Service: Gastroenterology;  Laterality: N/A;   FOOT SURGERY Bilateral    "took bones out; put pins in"   IR ABLATE LIVER CRYOABLATION  02/08/2020   IR RADIOLOGIST EVAL &  MGMT  02/03/2020   KNEE ARTHROSCOPY Left 08/16/2018   Procedure: ARTHROSCOPY KNEE PARTIAL MEDIAL MENISECTOMY, CHONDROPLASTY MEDIAL AND LATERAL, PLICA RELEASE MEDIAL;  Surgeon: Dorna Leitz, MD;  Location: [place];  Service: Orthopedics;  Laterality: Left;   LEFT AND RIGHT HEART CATHETERIZATION WITH CORONARY ANGIOGRAM N/A 03/31/2014   Procedure: LEFT AND RIGHT HEART CATHETERIZATION WITH CORONARY ANGIOGRAM;  Surgeon: Birdie Riddle, MD;  Location: [facility];  Service: Cardiovascular;  Laterality: N/A;   MYOMECTOMY  YRS AGO   sonohystogram  04/14/2016   [facility]: intramural fibroid, premenopausal endometrium, right fluid filled tubular structure    Allergies  Allergies  Allergen Reactions   Bupropion Other (See Comments)    Throat soreness and difficulty swallowing.    Norvasc [Amlodipine] Swelling    Significant leg swelling with 87m. Able to tolerate 2.559m     History of Present Illness    Hailey Barnes a [age]ear old female  with the above mention past medical history who presents today for overdue follow-up of CHF and HTN.  Hailey Barnes initially seen by Dr. SkMarlou Porchn 2017 following hospitalization for shortness of breath.  Patient was diagnosed with chronic diastolic heart failure.  2D echo completed during that time with EF of 70% with grade 1 DD.  She was seen in the ED 11/2016 with complaint of chest pain.  Cardiac enzymes were negative and ECG with no acute changes.  In 09/2020 patient  was transported to the ED via EMS for SVT that terminated with 6 mg of adenosine.  She was seen by Dr. AlRayann Hemanor consultation and elected to have catheter ablation for management.  She eventually decided against ablation and has been managed with medications.  She was last seen by Dr. SkMarlou Porch/2022.  She had complaints of chest discomfort and coronary CT was completed that revealed no evidence of CAD with dilated main pulmonary artery of 35 mm suggesting HTN.  2D echo was completed with EF of 65-70%, no RWMA, mild LVH, grade 1 DD, with normal valve function.  Since last being seen in the office patient reports***.  Patient denies chest pain, palpitations, dyspnea, PND, orthopnea, nausea, vomiting, dizziness, syncope, edema, weight gain, or early satiety.     ***Notes: -Sinus bradycardia with beta-blocker therapy.  Home Medications    Current Outpatient Medications  Medication Sig Dispense Refill   albuterol (VENTOLIN HFA) 108 (90 Base) MCG/ACT inhaler INHALE 1-2 PUFFS BY MOUTH EVERY 6 HOURS AS NEEDED FOR WHEEZE OR  SHORTNESS OF BREATH (Patient taking differently: 2 puffs every 6 (six) hours as needed for wheezing or shortness of breath.) 18 each 1   allopurinol (ZYLOPRIM) 300 MG tablet TAKE 1 TABLET BY MOUTH EVERY DAY 90 tablet 1   amLODipine (NORVASC) 2.5 MG tablet Take 1 tablet (2.5 mg total) by mouth at bedtime. 90 tablet 3   aspirin EC 81 MG tablet Take 81 mg by mouth daily.     atorvastatin (LIPITOR) 40 MG tablet TAKE 1 TABLET BY MOUTH ONCE DAILY (Patient taking differently: Take 40 mg by mouth daily.) 90 tablet 3   BD PEN NEEDLE NANO U/F 32G X 4 MM MISC USE 3 TIMES A DAY 200 each 4   Cholecalciferol (VITAMIN D) 50 MCG (2000 UT) tablet Take 4,000 Units by mouth daily.     conjugated estrogens (PREMARIN) vaginal cream Place 1 Applicatorful vaginally daily. 42.5 g 12   Fluticasone-Umeclidin-Vilant (TRELEGY ELLIPTA) 200-62.5-25 MCG/ACT AEPB Use as directed 1  puff in the mouth or throat daily. TAKE 1 PUFF BY MOUTH EVERY DAY Strength: 200-62.5-25 MCG/ACT 60 each 3   glucose blood (ACCU-CHEK AVIVA) test strip Use to check sugar up to 3 times daily 100 each 12   hydrALAZINE (APRESOLINE) 25 MG tablet Take 2 tablets (50 mg total) by mouth 3 (three) times daily.     ibuprofen (ADVIL) 200 MG tablet Take 400 mg by mouth every 6 (six) hours as needed for moderate pain.     Lancets (ACCU-CHEK SOFT TOUCH) lancets Use as instructed 100 each 12   losartan (COZAAR) 100 MG tablet TAKE 1 TABLET BY MOUTH EVERY DAY 90 tablet 3   Melaton-Thean-Cham-PassF-LBalm (MELATONIN + L-THEANINE) CAPS Take 2 capsules by mouth at bedtime as needed (sleep).     Multiple Vitamins-Minerals (MULTIVITAMIN GUMMIES ADULT PO) Take 2 capsules by mouth daily.     naproxen sodium (ALEVE) 220 MG tablet Take 440 mg by mouth daily as needed (pain).     ondansetron (ZOFRAN) 4 MG tablet Take 1 tablet (4 mg total) by mouth every 8 (eight) hours as needed for nausea or vomiting. 20 tablet 0   OZEMPIC, 0.25 OR 0.5 MG/DOSE, 2 MG/3ML SOPN INJECT 0.5 MG INTO  THE SKIN ONCE A WEEK. 0.25 MG ONCE WEEKLY FOR 4 WEEKS THEN INCREASE TO 0.5 MG WEEKLY FOR AT LEAST 4 WEEKS,MAX 1 MG 3 mL 11   pindolol (VISKEN) 10 MG tablet TAKE 2 TABLETS BY MOUTH TWICE A DAY 60 tablet 0   Polyvinyl Alcohol-Povidone (CLEAR EYES ALL SEASONS OP) Place 1 drop into both eyes daily as needed (allergies/itchy eyes).     spironolactone (ALDACTONE) 100 MG tablet Take 1 tablet (100 mg total) by mouth at bedtime. 90 tablet 3   torsemide (DEMADEX) 20 MG tablet Take 1 tablet (20 mg total) by mouth every other day.     triamcinolone (KENALOG) 0.025 % ointment APPLY 1 APPLICATION TOPICALLY 2 (TWO) TIMES DAILY AS NEEDED. (Patient taking differently: Apply 1 application  topically 2 (two) times daily as needed (eczema).) 75 g 0   No current facility-administered medications for this visit.     Review of Systems  Please see the history of present illness.    (+)*** (+)***  All other systems reviewed and are otherwise negative except as noted above.  Physical Exam    Wt Readings from Last 3 Encounters:  06/16/22 248 lb (112.5 kg)  05/29/22 248 lb 4 oz (112.6 kg)  05/16/22 249 lb 12.8 oz (113.3 kg)   DV:VOHYW were no vitals filed for this visit.,There is no height or weight on file to calculate BMI.  Constitutional:      Appearance: Healthy appearance. Not in distress.  Neck:     Vascular: JVD normal.  Pulmonary:     Effort: Pulmonary effort is normal.     Breath sounds: No wheezing. No rales. Diminished in the bases Cardiovascular:     Normal rate. Regular rhythm. Normal S1. Normal S2.      Murmurs: There is no murmur.  Edema:    Peripheral edema absent.  Abdominal:     Palpations: Abdomen is soft non tender. There is no hepatomegaly.  Skin:    General: Skin is warm and dry.  Neurological:     General: No focal deficit present.     Mental Status: Alert and oriented to person, place and time.     Cranial Nerves: Cranial nerves are intact.  EKG/LABS/Other Studies Reviewed     ECG personally  reviewed by me today - ***  Risk Assessment/Calculations:   {Does this patient have ATRIAL FIBRILLATION?[phone]}        Lab Results  Component Value Date   WBC 11.8 (H) 03/26/2022   HGB 16.2 (H) 03/26/2022   HCT 47.3 (H) 03/26/2022   MCV 85 03/26/2022   PLT 233 03/26/2022   Lab Results  Component Value Date   CREATININE 0.92 03/26/2022   BUN 11 03/26/2022   NA 143 03/26/2022   K 3.8 03/26/2022   CL 104 03/26/2022   CO2 25 03/26/2022   Lab Results  Component Value Date   ALT 20 03/18/2022   AST 20 03/18/2022   GGT 46 03/19/2018   ALKPHOS 77 03/18/2022   BILITOT 0.9 03/18/2022   Lab Results  Component Value Date   CHOL 137 04/09/2020   HDL 42 04/09/2020   LDLCALC 68 04/09/2020   TRIG 155 (H) 04/09/2020   CHOLHDL 3.3 04/09/2020    Lab Results  Component Value Date   HGBA1C 6.3 (H) 03/16/2022    Assessment & Plan    1.  HFpEF: -EF of 65-70%, no RWMA, mild LVH, grade 1 DD, with normal valve function.  2.  Nonobstructive CAD: -CT was completed that revealed no evidence of CAD with dilated main pulmonary artery of 35 mm suggesting HTN.    3.  History of SVT: -Patient was referred to EP in 2021 and agreed to have SVT ablation however changed her mind -Today patient reports***   4.  Hypertension: -Patient's blood pressure today was***  Disposition: Follow-up with Candee Furbish, MD or APP in *** months {Are you ordering a CV Procedure (e.g. stress test, cath, DCCV, TEE, etc)?   Press F2        :[id number]}   Medication Adjustments/Labs and Tests Ordered: Current medicines are reviewed at length with the patient today.  Concerns regarding medicines are outlined above.   Signed, Mable Fill, Marissa Nestle, NP 07/07/2022, 12:10 PM [place]

## 2022-07-08 ENCOUNTER — Telehealth: Payer: Self-pay | Admitting: Cardiology

## 2022-07-08 ENCOUNTER — Ambulatory Visit: Payer: Medicare Other | Admitting: Nurse Practitioner

## 2022-07-08 DIAGNOSIS — I5032 Chronic diastolic (congestive) heart failure: Secondary | ICD-10-CM

## 2022-07-08 NOTE — Telephone Encounter (Signed)
*  STAT* If patient is at the pharmacy, call can be transferred to refill team.   1. Which medications need to be refilled? (please list name of each medication and dose if known)   pindolol (VISKEN) 10 MG tablet  2. Which pharmacy/location (including street and city if local pharmacy) is medication to be sent to?  [id number] [place],  - [address]. Do they need a 30 day or 90 day supply? 90 day  Patient stated she is out of this medication.

## 2022-07-08 NOTE — Telephone Encounter (Signed)
Pt has been made aware that a 30 day refill for Pindolol was sent to her pharmacy yesterday, 07/07/22 and she can get a 90 day refill at her upcoming appointment.  Pt verbalized understanding and thanked me for the call.

## 2022-07-16 ENCOUNTER — Other Ambulatory Visit: Payer: Self-pay | Admitting: Cardiology

## 2022-07-21 NOTE — Progress Notes (Signed)
Office Visit    Patient Name: Hailey Barnes Date of Encounter: 07/21/2022  Primary Care Provider:  Alen Bleacher, Barnes Primary Cardiologist:  Hailey Barnes Primary Electrophysiologist: None  Chief Complaint    Hailey Barnes is a 61 y.o. female with PMH of HTN, diastolic CHF, COPD, HLD, DM 2, CVA, tobacco abuse, migraines who presents today for follow-up of HTN and HFpEF.  Past Medical History    Past Medical History:  Diagnosis Date   Arthritis    "knees" (02/21/2014), back   CHF (congestive heart failure) ([place])    Chronic bronchitis ([place])    "get it q yr" (02/21/2014)   Chronic diastolic CHF (congestive heart failure) ([place])    a. Echo 10/23/2016: EF 75 %   Chronic lower back pain    Complication of anesthesia    "I don't come out well; I chew on my tongue"   COPD (chronic obstructive pulmonary disease) ([place])    Diabetes mellitus without complication ([place])    TYPE 2   Family history of adverse reaction to anesthesia    BROTHER PONV, had a blood clot several days later and heart stopped   Fatty liver    NONALCOLIC   Fibroid    Heart murmur    Hypertension    Infection of right eye    current dx on Saturday 7/15 - using drops   Leg swelling    bilateral   Migraine 1982-2009   MVP (mitral valve prolapse)    Neuromuscular disorder (HCC)    right leg nerve pain   Shortness of breath    WITH EXERTION   Sinus pause    a. noted on telemetry during admission from 10/2016, lasting up to 4.4 seconds. BB discontinued.    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   D/Dx: carpal tunnel, ulnar neuropathy Less likely: cervical radiculopathy, vasculitis    Past Surgical History:  Procedure Laterality  Date   CESAREAN SECTION  1982   CHOLECYSTECTOMY N/A 06/17/2017   Procedure: LAPAROSCOPIC CHOLECYSTECTOMY WITH INTRAOPERATIVE CHOLANGIOGRAM;  Surgeon: Donnie Mesa, Barnes;  Location: [place];  Service: General;  Laterality: N/A;   COLONOSCOPY WITH PROPOFOL N/A 04/28/2017   Procedure: COLONOSCOPY WITH PROPOFOL;  Surgeon: Manus Gunning, Barnes;  Location: [facility];  Service: Gastroenterology;  Laterality: N/A;   DILATATION & CURETTAGE/HYSTEROSCOPY WITH MYOSURE N/A 05/06/2016   Procedure: DILATATION & CURETTAGE/HYSTEROSCOPY WITH MYOSURE;  Surgeon: Terrance Mass, Barnes;  Location: [facility];  Service: Gynecology;  Laterality: N/A;  request to follow around 8:45  requests one hour OR time   [place] N/A 04/03/2017   Procedure: [place];  Surgeon: Terrance Mass, Barnes;  Location: [facility];  Service: Gynecology;  Laterality: N/A;   DILATION AND CURETTAGE OF UTERUS     ESOPHAGOGASTRODUODENOSCOPY (EGD) WITH PROPOFOL N/A 04/28/2017   Procedure: ESOPHAGOGASTRODUODENOSCOPY (EGD) WITH PROPOFOL;  Surgeon: Manus Gunning, Barnes;  Location: [facility];  Service: Gastroenterology;  Laterality: N/A;   FOOT SURGERY Bilateral    "took bones out; put pins in"   IR ABLATE LIVER CRYOABLATION  02/08/2020   IR RADIOLOGIST EVAL & MGMT  02/03/2020   KNEE ARTHROSCOPY Left 08/16/2018   Procedure: ARTHROSCOPY KNEE PARTIAL MEDIAL MENISECTOMY, CHONDROPLASTY MEDIAL AND LATERAL, PLICA RELEASE MEDIAL;  Surgeon: Dorna Leitz, Barnes;  Location: [place];  Service: Orthopedics;  Laterality: Left;   LEFT AND RIGHT HEART CATHETERIZATION WITH CORONARY ANGIOGRAM N/A 03/31/2014   Procedure: LEFT AND RIGHT HEART CATHETERIZATION WITH CORONARY ANGIOGRAM;  Surgeon: Birdie Riddle, Barnes;  Location: [place] [facility];  Service: Cardiovascular;  Laterality: N/A;   MYOMECTOMY  YRS AGO   sonohystogram  04/14/2016   [facility]: intramural fibroid,  premenopausal endometrium, right fluid filled tubular structure    Allergies  Allergies  Allergen Reactions   Bupropion Other (See Comments)    Throat soreness and difficulty swallowing.    Norvasc [Amlodipine] Swelling    Significant leg swelling with 24m. Able to tolerate 2.540m     History of Present Illness    BeJaretssi Barnes a [age]ear old female  with the above mention past medical history who presents today for overdue follow-up of CHF and HTN.  Hailey Barnes initially seen by Dr. SkMarlou Porchn 2017 following hospitalization for shortness of breath.  Patient was diagnosed with chronic diastolic heart failure.  2D echo completed during that time with EF of 70% with grade 1 DD.  She was seen in the ED 11/2016 with complaint of chest pain.  Cardiac enzymes were negative and ECG with no acute changes.  In 09/2020 patient  was transported to the ED via EMS for SVT that terminated with 6 mg of adenosine.  She was seen by Hailey Barnes consultation and elected to have catheter ablation for management.  She eventually decided against ablation and has been managed with medications.  She was last seen by Hailey Barnes/2022.  She had complaints of chest discomfort and coronary CT was completed that revealed no evidence of CAD with dilated main pulmonary artery of 35 mm suggesting HTN.  2D echo was completed with EF of 65-70%, no RWMA, mild LVH, grade 1 DD, with normal valve function.   Hailey Barnes today alone for follow-up visit.  Since last being seen in the office patient reports she has been doing much better from a cardiac perspective with no complaints of chest pain or shortness of breath.  She does endorse occasional bouts of tachycardia that are relieved with vagal maneuvers.  She reports some exercise intolerance that causes shortness of breath.  She is euvolemic on examination today and blood pressures are controlled at 132/85 with heart rate of 79.  She is still actively smoking 10  cigarettes/day.  We discussed the importance of abstaining from cigarettes and preventing progression of coronary disease and lung disease.  Patient denies chest pain, palpitations, dyspnea, PND, orthopnea, nausea, vomiting, dizziness, syncope, edema, weight gain, or early satiety.   Home Medications    Current Outpatient Medications  Medication Sig Dispense Refill   albuterol (VENTOLIN HFA) 108 (90 Base) MCG/ACT inhaler INHALE 1-2 PUFFS BY MOUTH EVERY 6 HOURS AS NEEDED FOR WHEEZE OR SHORTNESS OF BREATH (Patient taking differently: 2 puffs every 6 (six) hours as needed for wheezing or shortness of breath.) 18 each 1   allopurinol (ZYLOPRIM) 300 MG tablet TAKE 1 TABLET BY MOUTH EVERY DAY 90 tablet 1   amLODipine (NORVASC) 2.5 MG tablet Take 1 tablet (2.5 mg total) by mouth at bedtime. 90 tablet 3   aspirin EC 81 MG tablet Take 81 mg by mouth daily.  atorvastatin (LIPITOR) 40 MG tablet TAKE 1 TABLET BY MOUTH ONCE DAILY (Patient taking differently: Take 40 mg by mouth daily.) 90 tablet 3   BD PEN NEEDLE NANO U/F 32G X 4 MM MISC USE 3 TIMES A DAY 200 each 4   Cholecalciferol (VITAMIN D) 50 MCG (2000 UT) tablet Take 4,000 Units by mouth daily.     conjugated estrogens (PREMARIN) vaginal cream Place 1 Applicatorful vaginally daily. 42.5 g 12   Fluticasone-Umeclidin-Vilant (TRELEGY ELLIPTA) 200-62.5-25 MCG/ACT AEPB Use as directed 1 puff in the mouth or throat daily. TAKE 1 PUFF BY MOUTH EVERY DAY Strength: 200-62.5-25 MCG/ACT 60 each 3   glucose blood (ACCU-CHEK AVIVA) test strip Use to check sugar up to 3 times daily 100 each 12   hydrALAZINE (APRESOLINE) 25 MG tablet Take 2 tablets (50 mg total) by mouth 3 (three) times daily.     ibuprofen (ADVIL) 200 MG tablet Take 400 mg by mouth every 6 (six) hours as needed for moderate pain.     Lancets (ACCU-CHEK SOFT TOUCH) lancets Use as instructed 100 each 12   losartan (COZAAR) 100 MG tablet TAKE 1 TABLET BY MOUTH EVERY DAY 90 tablet 3    Melaton-Thean-Cham-PassF-LBalm (MELATONIN + L-THEANINE) CAPS Take 2 capsules by mouth at bedtime as needed (sleep).     Multiple Vitamins-Minerals (MULTIVITAMIN GUMMIES ADULT PO) Take 2 capsules by mouth daily.     naproxen sodium (ALEVE) 220 MG tablet Take 440 mg by mouth daily as needed (pain).     OZEMPIC, 0.25 OR 0.5 MG/DOSE, 2 MG/3ML SOPN INJECT 0.5 MG INTO THE SKIN ONCE A WEEK. 0.25 MG ONCE WEEKLY FOR 4 WEEKS THEN INCREASE TO 0.5 MG WEEKLY FOR AT LEAST 4 WEEKS,MAX 1 MG 3 mL 11   pindolol (VISKEN) 10 MG tablet Take 2 tablets (20 mg total) by mouth 2 (two) times daily. NO FURTHER REFILLS UNTIL KEEPS APPT 9/19 60 tablet 0   Polyvinyl Alcohol-Povidone (CLEAR EYES ALL SEASONS OP) Place 1 drop into both eyes daily as needed (allergies/itchy eyes).     spironolactone (ALDACTONE) 100 MG tablet Take 1 tablet (100 mg total) by mouth at bedtime. 90 tablet 3   torsemide (DEMADEX) 20 MG tablet Take 1 tablet (20 mg total) by mouth every other day.     triamcinolone (KENALOG) 0.025 % ointment APPLY 1 APPLICATION TOPICALLY 2 (TWO) TIMES DAILY AS NEEDED. (Patient taking differently: Apply 1 application  topically 2 (two) times daily as needed (eczema).) 75 g 0   No current facility-administered medications for this visit.     Review of Systems  Please see the history of present illness.    (+) Shortness of breath with exertion (+) Tachycardia  All other systems reviewed and are otherwise negative except as noted above.  Physical Exam    Wt Readings from Last 3 Encounters:  06/16/22 248 lb (112.5 kg)  05/29/22 248 lb 4 oz (112.6 kg)  05/16/22 249 lb 12.8 oz (113.3 kg)   EX:BMWUX were no vitals filed for this visit.,There is no height or weight on file to calculate BMI.  Constitutional:      Appearance: Healthy appearance. Not in distress.  Neck:     Vascular: JVD normal.  Pulmonary:     Effort: Pulmonary effort is normal.     Breath sounds: No wheezing. No rales. Diminished in the  bases Cardiovascular:     Normal rate. Regular rhythm. Normal S1. Normal S2.      Murmurs: There is no murmur.  Edema:    Peripheral edema absent.  Abdominal:     Palpations: Abdomen is soft non tender. There is no hepatomegaly.  Skin:    General: Skin is warm and dry.  Neurological:     General: No focal deficit present.     Mental Status: Alert and oriented to person, place and time.     Cranial Nerves: Cranial nerves are intact.  EKG/LABS/Other Studies Reviewed    ECG personally reviewed by me today -none completed today  Risk Assessment/Calculations:            Lab Results  Component Value Date   WBC 11.8 (H) 03/26/2022   HGB 16.2 (H) 03/26/2022   HCT 47.3 (H) 03/26/2022   MCV 85 03/26/2022   PLT 233 03/26/2022   Lab Results  Component Value Date   CREATININE 0.92 03/26/2022   BUN 11 03/26/2022   NA 143 03/26/2022   K 3.8 03/26/2022   CL 104 03/26/2022   CO2 25 03/26/2022   Lab Results  Component Value Date   ALT 20 03/18/2022   AST 20 03/18/2022   GGT 46 03/19/2018   ALKPHOS 77 03/18/2022   BILITOT 0.9 03/18/2022   Lab Results  Component Value Date   CHOL 137 04/09/2020   HDL 42 04/09/2020   LDLCALC 68 04/09/2020   TRIG 155 (H) 04/09/2020   CHOLHDL 3.3 04/09/2020    Lab Results  Component Value Date   HGBA1C 6.3 (H) 03/16/2022    Assessment & Plan    1.  HFpEF: -EF of 65-70%, no RWMA, mild LVH, grade 1 DD, with normal valve function. -Patient is euvolemic on examination with shortness of breath with heavy exertion. -She is abstaining from excess sodium in her diet and is currently weighing herself daily. -Continue GDMT with losartan 100 mg, spironolactone 100 mg daily daily and pindolol 10 mg daily -Patient was intolerant to SGLT2 due to increased UTIs and yeast infections.  -Continue torsemide 20 mg daily  2.  Nonobstructive CAD: -CT was completed that revealed no evidence of CAD with dilated main pulmonary artery of 35 mm suggesting HTN.    -Patient denies any complaints of chest pain shortness of breath.   3.  History of SVT: -Patient was referred to EP in 2021 and agreed to have SVT ablation however changed her mind -Today patient reports some bouts of palpitations that are relieved with vagal maneuver -Continue pindolol 10 mg as noted above   4.  Hypertension: -Patient's blood pressure today was well controlled at 132/85 -Continue current antihypertensive regimen as noted below  5.  Morbid obesity: Patient's BMI is 42.30 kg/m   6.  History of tobacco abuse: -Patient reports smoking half a pack per day -Patient also reports claudication symptoms when walking with complaints of burning in the lower extremities. -We will order ABIs to rule out possible claudication -Patient also had auscultated bruit on the right side and we will order carotid Dopplers to evaluate for possible carotid stenosis  Disposition: Follow-up with Hailey Barnes or APP in 6 months     Medication Adjustments/Labs and Tests Ordered: Current medicines are reviewed at length with the patient today.  Concerns regarding medicines are outlined above.   Signed, Mable Fill, Marissa Nestle, NP 07/21/2022, 10:36 AM [facility]  Note:  This document was prepared using Dragon voice recognition software and may include unintentional dictation errors.

## 2022-07-22 ENCOUNTER — Ambulatory Visit: Payer: Medicare Other | Attending: Nurse Practitioner | Admitting: Nurse Practitioner

## 2022-07-22 ENCOUNTER — Encounter: Payer: Self-pay | Admitting: Nurse Practitioner

## 2022-07-22 VITALS — BP 132/85 | HR 79 | Ht 62.5 in | Wt 235.0 lb

## 2022-07-22 DIAGNOSIS — I5032 Chronic diastolic (congestive) heart failure: Secondary | ICD-10-CM | POA: Diagnosis not present

## 2022-07-22 DIAGNOSIS — I10 Essential (primary) hypertension: Secondary | ICD-10-CM

## 2022-07-22 DIAGNOSIS — R0989 Other specified symptoms and signs involving the circulatory and respiratory systems: Secondary | ICD-10-CM

## 2022-07-22 DIAGNOSIS — E7800 Pure hypercholesterolemia, unspecified: Secondary | ICD-10-CM | POA: Diagnosis not present

## 2022-07-22 DIAGNOSIS — I739 Peripheral vascular disease, unspecified: Secondary | ICD-10-CM

## 2022-07-22 MED ORDER — PINDOLOL 10 MG PO TABS: 20.0000 mg | ORAL_TABLET | Freq: Two times a day (BID) | ORAL | 3 refills | Status: DC

## 2022-07-22 NOTE — Patient Instructions (Signed)
Medication Instructions:   Your physician recommends that you continue on your current medications as directed. Please refer to the Current Medication list given to you today.   *If you need a refill on your cardiac medications before your next appointment, please call your pharmacy*   Lab Work:  None ordered.   If you have labs (blood work) drawn today and your tests are completely normal, you will receive your results only by: [place] (if you have MyChart) OR A paper copy in the mail If you have any lab test that is abnormal or we need to change your treatment, we will call you to review the results.   Testing/Procedures:  Your physician has requested that you have a carotid duplex. This test is an ultrasound of the carotid arteries in your neck. It looks at blood flow through these arteries that supply the brain with blood. Allow one hour for this exam. There are no restrictions or special instructions.  Your physician has requested that you have an ankle brachial index (ABI). During this test an ultrasound and blood pressure cuff are used to evaluate the arteries that supply the arms and legs with blood. Allow thirty minutes for this exam. There are no restrictions or special instructions.   Follow-Up: At [facility], you and your health needs are our priority.  As part of our continuing mission to provide you with exceptional heart care, we have created designated Provider Care Teams.  These Care Teams include your primary Cardiologist (physician) and Advanced Practice Providers (APPs -  Physician Assistants and Nurse Practitioners) who all work together to provide you with the care you need, when you need it.  We recommend signing up for the patient portal called "MyChart".  Sign up information is provided on this After Visit Summary.  MyChart is used to connect with patients for Virtual Visits (Telemedicine).  Patients are able to view lab/test results, encounter  notes, upcoming appointments, etc.  Non-urgent messages can be sent to your provider as well.   To learn more about what you can do with MyChart, go to [email or web address].    Your next appointment:   6 month(s)  The format for your next appointment:   In Person  Provider:   Ambrose Pancoast, NP         Other Instructions  Your physician wants you to follow-up in: 6 months with Madaline Guthrie.  You will receive a reminder letter in the mail two months in advance. If you don't receive a letter, please call our office to schedule the follow-up appointment.   Important Information About Sugar

## 2022-07-24 ENCOUNTER — Other Ambulatory Visit: Payer: Self-pay

## 2022-07-24 DIAGNOSIS — I10 Essential (primary) hypertension: Secondary | ICD-10-CM

## 2022-07-24 MED ORDER — ACCU-CHEK SOFTCLIX LANCETS MISC: 12 refills | Status: AC

## 2022-07-24 MED ORDER — ATORVASTATIN CALCIUM 40 MG PO TABS: 40.0000 mg | ORAL_TABLET | Freq: Every day | ORAL | 3 refills | Status: DC

## 2022-07-28 ENCOUNTER — Ambulatory Visit (HOSPITAL_COMMUNITY)
Admission: RE | Admit: 2022-07-28 | Discharge: 2022-07-28 | Disposition: A | Discharge status: Home / Self Care | Payer: Medicare Other | Source: Ambulatory Visit | Attending: Internal Medicine | Admitting: Internal Medicine

## 2022-07-28 DIAGNOSIS — I5032 Chronic diastolic (congestive) heart failure: Secondary | ICD-10-CM | POA: Insufficient documentation

## 2022-07-28 DIAGNOSIS — R0989 Other specified symptoms and signs involving the circulatory and respiratory systems: Secondary | ICD-10-CM | POA: Diagnosis not present

## 2022-07-28 DIAGNOSIS — I739 Peripheral vascular disease, unspecified: Secondary | ICD-10-CM | POA: Insufficient documentation

## 2022-08-06 ENCOUNTER — Ambulatory Visit (HOSPITAL_COMMUNITY)
Admission: RE | Admit: 2022-08-06 | Discharge: 2022-08-06 | Disposition: A | Discharge status: Home / Self Care | Payer: Medicare Other | Source: Ambulatory Visit | Attending: Internal Medicine | Admitting: Internal Medicine

## 2022-08-06 DIAGNOSIS — I739 Peripheral vascular disease, unspecified: Secondary | ICD-10-CM | POA: Insufficient documentation

## 2022-08-06 DIAGNOSIS — R0989 Other specified symptoms and signs involving the circulatory and respiratory systems: Secondary | ICD-10-CM | POA: Diagnosis not present

## 2022-08-06 DIAGNOSIS — I5032 Chronic diastolic (congestive) heart failure: Secondary | ICD-10-CM | POA: Diagnosis not present

## 2022-08-11 ENCOUNTER — Ambulatory Visit (INDEPENDENT_AMBULATORY_CARE_PROVIDER_SITE_OTHER): Payer: Medicare Other | Admitting: Family Medicine

## 2022-08-11 VITALS — BP 128/83 | HR 80 | Ht 62.0 in | Wt 238.2 lb

## 2022-08-11 DIAGNOSIS — G609 Hereditary and idiopathic neuropathy, unspecified: Secondary | ICD-10-CM | POA: Diagnosis not present

## 2022-08-11 DIAGNOSIS — E1169 Type 2 diabetes mellitus with other specified complication: Secondary | ICD-10-CM | POA: Diagnosis not present

## 2022-08-11 DIAGNOSIS — R300 Dysuria: Secondary | ICD-10-CM

## 2022-08-11 LAB — POCT GLYCOSYLATED HEMOGLOBIN (HGB A1C): HbA1c, POC (controlled diabetic range): 5.7 % (ref 0.0–7.0)

## 2022-08-11 NOTE — Patient Instructions (Signed)
It was great seeing you today!  You came in for peripheral neuropathy which is likely due to diabetes but we are checking other labs to make sure there isn't another cause.   We discussed starting Gabapentin but you would like to wait for now. If you worsen and later want to start medicine let us know.   Feel free to call with any questions or concerns at any time, at [phone].   Take care,  Dr. Shary Key [facility]

## 2022-08-11 NOTE — Progress Notes (Signed)
    SUBJECTIVE:   CHIEF COMPLAINT / HPI:   Patient presents for lower extremity pain. States she feels like burning pins and needles sensation of both of her feet and lower extremities for the past 2 months. Does not occur every day. Feels it when she is walking. Does not feel it when sitting down. Also endorses this feeling in her hands. A1c 6.3 four months ago. She states she saw her cardiologist who did work up for PAD and everything looked good.    PERTINENT  PMH / PSH: Reviewed   OBJECTIVE:   BP 128/83   Pulse 80   Ht 5' 2"  (1.575 m)   Wt 238 lb 4 oz (108.1 kg)   LMP 04/06/2017   SpO2 99%   BMI 43.58 kg/m    Physical exam General: well appearing, NAD Cardiovascular: RRR, no murmurs Lungs: CTAB. Normal WOB Abdomen: soft, non-distended, non-tender Skin: warm, dry. No edema  Diabetic foot exam was performed with the following findings:   No deformities, ulcerations, or other skin breakdown Normal sensation of 10g monofilament Intact posterior tibialis and dorsalis pedis pulses      ASSESSMENT/PLAN:   Peripheral neuropathy Patient presents with bilateral hand and lower extremity pins and needles sensation. Physical exam unremarkable and with normal sensation. Neuropathy likely due to diabetes though it is well controlled with her A1c being 5.7 today. Will obtain additional blood work to ensure there is no other cause of this neuropathy such as Vitamin B12 deficiency or infectious causes.  - f/u Vit B12, CBC, CMP, RPR, HIV, TSH    Shary Key, [place]

## 2022-08-12 DIAGNOSIS — G629 Polyneuropathy, unspecified: Secondary | ICD-10-CM | POA: Insufficient documentation

## 2022-08-12 LAB — CMP14+EGFR
ALT: 13 IU/L (ref 0–32)
AST: 18 IU/L (ref 0–40)
Albumin/Globulin Ratio: 1.6 (ref 1.2–2.2)
Albumin: 4 g/dL (ref 3.8–4.9)
Alkaline Phosphatase: 111 IU/L (ref 44–121)
BUN/Creatinine Ratio: 9 (ref 9–23)
BUN: 9 mg/dL (ref 6–24)
Bilirubin Total: 0.3 mg/dL (ref 0.0–1.2)
CO2: 25 mmol/L (ref 20–29)
Calcium: 9.8 mg/dL (ref 8.7–10.2)
Chloride: 101 mmol/L (ref 96–106)
Creatinine, Ser: 0.97 mg/dL (ref 0.57–1.00)
Globulin, Total: 2.5 g/dL (ref 1.5–4.5)
Glucose: 71 mg/dL (ref 70–99)
Potassium: 4 mmol/L (ref 3.5–5.2)
Sodium: 142 mmol/L (ref 134–144)
Total Protein: 6.5 g/dL (ref 6.0–8.5)
eGFR: 67 mL/min/{1.73_m2} (ref >59)

## 2022-08-12 LAB — CBC
Hematocrit: 46 % (ref 34.0–46.6)
Hemoglobin: 15.8 g/dL (ref 11.1–15.9)
MCH: 29.4 pg (ref 26.6–33.0)
MCHC: 34.3 g/dL (ref 31.5–35.7)
MCV: 86 fL (ref 79–97)
Platelets: 231 10*3/uL (ref 150–450)
RBC: 5.38 x10E6/uL — ABNORMAL HIGH (ref 3.77–5.28)
RDW: 13.5 % (ref 11.7–15.4)
WBC: 10.7 10*3/uL (ref 3.4–10.8)

## 2022-08-12 LAB — HIV ANTIBODY (ROUTINE TESTING W REFLEX): HIV Screen 4th Generation wRfx: NONREACTIVE

## 2022-08-12 LAB — VITAMIN B12: Vitamin B-12: 487 pg/mL (ref 232–1245)

## 2022-08-12 LAB — TSH: TSH: 1.78 u[IU]/mL (ref 0.450–4.500)

## 2022-08-12 NOTE — Assessment & Plan Note (Addendum)
Patient presents with bilateral hand and lower extremity pins and needles sensation. Physical exam unremarkable and with normal sensation. Neuropathy likely due to diabetes though it is well controlled with her A1c being 5.7 today. Will obtain additional blood work to ensure there is no other cause of this neuropathy such as Vitamin B12 deficiency or infectious causes.  - f/u Vit B12, CBC, CMP, RPR, HIV, TSH

## 2022-08-13 ENCOUNTER — Ambulatory Visit (INDEPENDENT_AMBULATORY_CARE_PROVIDER_SITE_OTHER): Payer: Medicare Other | Admitting: Family Medicine

## 2022-08-13 ENCOUNTER — Ambulatory Visit: Payer: Self-pay

## 2022-08-13 VITALS — BP 142/90 | HR 73 | Ht 62.0 in | Wt 238.0 lb

## 2022-08-13 DIAGNOSIS — M1812 Unilateral primary osteoarthritis of first carpometacarpal joint, left hand: Secondary | ICD-10-CM | POA: Diagnosis not present

## 2022-08-13 DIAGNOSIS — M79642 Pain in left hand: Secondary | ICD-10-CM | POA: Diagnosis not present

## 2022-08-13 DIAGNOSIS — R29898 Other symptoms and signs involving the musculoskeletal system: Secondary | ICD-10-CM

## 2022-08-13 DIAGNOSIS — M79641 Pain in right hand: Secondary | ICD-10-CM

## 2022-08-13 DIAGNOSIS — G5601 Carpal tunnel syndrome, right upper limb: Secondary | ICD-10-CM | POA: Diagnosis not present

## 2022-08-13 DIAGNOSIS — M5416 Radiculopathy, lumbar region: Secondary | ICD-10-CM | POA: Diagnosis not present

## 2022-08-13 LAB — SPECIMEN STATUS REPORT

## 2022-08-13 LAB — RPR: RPR Ser Ql: NONREACTIVE

## 2022-08-13 NOTE — Patient Instructions (Signed)
Thank you for coming in today.   Call or go to the ER if you develop a large red swollen joint with extreme pain or oozing puss.    Continue carpal tunnel brace.   Please use Voltaren gel (Generic Diclofenac Gel) up to 4x daily for pain as needed.  This is available over-the-counter as both the name brand Voltaren gel and the generic diclofenac gel.   Recheck as needed.   We can repeat these injections every 3 months.

## 2022-08-13 NOTE — Progress Notes (Signed)
Hailey Barnes is a 61 y.o. female who presents to [place] at [facility] today for B thumb pain. She was last seen by Dr. [name] on 04/18/22 for B thumb pain and had carpal tunnel injections. Today patient reports the right hand is the carpal tunnel acting up and in the left thumb it is the joint. Right thumb will wake her up in the middle of the night throbbing, burning, and numb. Feeling that it is time for injections again.   Additionally she notes weakness in her right lower extremity.  She has a history of lumbar radiculopathy and epidural steroid injections.  Last MRI was about 3 years ago in 2020.  She notes recently she has developed some weakness in her right leg.  She can only walk about 1 city block before she has difficulty with leg motion and strength and walking.  She has difficulty climbing stairs.  She denies any bowel or bladder dysfunction.  She had recently a ABI vascular ultrasound that was normal at her cardiology office.   Pertinent review of systems: No fevers or chills  Relevant historical information: Hypertension.  Heart failure.  Diabetes.  Carpal tunnel syndrome   Exam:  BP (!) 142/90   Pulse 73   Ht 5' 2"  (1.575 m)   Wt 238 lb (108 kg)   LMP 04/06/2017   SpO2 95%   BMI 43.53 kg/m  General: Well Developed, well nourished, and in no acute distress.   MSK:  Left wrist: Normal appearing.  No thenar atrophy. Positive Tinel's carpal tunnel. Intact grip strength.  Decreased sensation to light touch first 3 digits.  Left hand and wrist: Swelling at first [facility].  Tender palpation of this region.  L-spine: Normal appearing Nontender to palpation lumbar midline. Lower extremity strength 3+/5 right hip flexion and 4/5 right hip abduction.  5/5 right hip adduction. Knee extension is 4/5.  Knee flexion 5/5 right foot dorsiflexion 4/5 right foot plantarflexion 5/5. Left lower extremity strength is intact Reflexes are mildly diminished  bilaterally.  Sensation is intact.  Lab and Radiology Results  Procedure: Real-time Ultrasound Guided Injection of left first [facility] Device: Philips Affiniti 50G Images permanently stored and available for review in PACS Verbal informed consent obtained.  Discussed risks and benefits of procedure. Warned about infection, bleeding, hyperglycemia damage to structures among others. Patient expresses understanding and agreement Time-out conducted.   Noted no overlying erythema, induration, or other signs of local infection.   Skin prepped in a sterile fashion.   Local anesthesia: Topical Ethyl chloride.   With sterile technique and under real time ultrasound guidance: 40 mg of Kenalog and 1 mL of lidocaine injected into first [place]. Fluid seen entering the joint capsule.   Completed without difficulty   Pain immediately resolved suggesting accurate placement of the medication.   Advised to call if fevers/chills, erythema, induration, drainage, or persistent bleeding.   Images permanently stored and available for review in the ultrasound unit.  Impression: Technically successful ultrasound guided injection.    Procedure: Real-time Ultrasound Guided hydrodissection median nerve right carpal tunnel Device: Philips Affiniti 50G Images permanently stored and available for review in PACS Verbal informed consent obtained.  Discussed risks and benefits of procedure. Warned about infection, bleeding, hyperglycemia damage to structures among others. Patient expresses understanding and agreement Time-out conducted.   Noted no overlying erythema, induration, or other signs of local infection.   Skin prepped in a sterile fashion.   Local anesthesia: Topical Ethyl  chloride.   With sterile technique and under real time ultrasound guidance: 40 mg of Kenalog and 1 mL of lidocaine injected into carpal tunnel around median nerve. Fluid seen entering the carpal tunnel.   Completed without difficulty   Pain  immediately resolved suggesting accurate placement of the medication.   Advised to call if fevers/chills, erythema, induration, drainage, or persistent bleeding.   Images permanently stored and available for review in the ultrasound unit.  Impression: Technically successful ultrasound guided injection.   EXAM: MRI LUMBAR SPINE WITHOUT CONTRAST   TECHNIQUE: Multiplanar, multisequence MR imaging of the lumbar spine was performed. No intravenous contrast was administered.   COMPARISON:  Prior MRI from 06/06/2015.   FINDINGS: Segmentation: Standard. Lowest well-formed disc labeled the L5-S1 level.   Alignment: 4 mm anterolisthesis of L4 on L5, chronic and facet mediated. Alignment otherwise normal with preservation of the normal lumbar lordosis.   Vertebrae: Vertebral body height maintained without evidence for acute or chronic fracture. Bone marrow signal intensity mildly heterogeneous but within normal limits without discrete or worrisome osseous lesion. Reactive marrow edema present about the bilateral L2-3 facets due to facet arthritis. Minimal reactive endplate changes noted about the L2-3 and L4-5 interspaces. No other abnormal marrow edema.   Conus medullaris and cauda equina: Conus extends to the L1 level. Conus and cauda equina appear normal.   Paraspinal and other soft tissues: Paraspinous soft tissues within normal limits. 2.2 cm right adrenal nodule noted, indeterminate, but could reflect a small adenoma. Visualized visceral structures otherwise unremarkable.   Disc levels:   T11-12: Seen only on sagittal projection. Diffuse disc bulge with disc desiccation and intervertebral disc space narrowing. No significant stenosis or impingement.   T12-L1: Unremarkable.   L1-2:  Unremarkable.   L2-3: Mild diffuse disc bulge with disc desiccation. Moderate right worse than left facet hypertrophy with associated reactive marrow edema. No significant spinal stenosis.  Moderate right worse than left L2 foraminal narrowing.   L3-4: Negative interspace. Moderate bilateral facet hypertrophy. Mild epidural lipomatosis. Borderline mild canal with left lateral recess stenosis. Mild bilateral L3 foraminal narrowing. No impingement.   L4-5: 4 mm anterolisthesis. Associated broad posterior pseudo disc bulge/uncovering. Severe bilateral facet arthrosis with associated small joint effusions. Resultant moderate bilateral subarticular stenosis with bilateral L4 foraminal narrowing. Either of the L4 or descending L5 nerve roots could be affected.   L5-S1: Negative interspace. Mild to moderate facet hypertrophy. Mild epidural lipomatosis. No significant canal stenosis. Mild bilateral L5 foraminal narrowing.   IMPRESSION: 1. 4 mm facet mediated anterolisthesis of L4 on L5 with associated moderate bilateral foraminal and subarticular stenosis. Either of the exiting L4 or descending L5 nerve roots could be affected. 2. Disc bulging with facet hypertrophy at L2-3 with resultant moderate right worse than left L2 foraminal stenosis. 3. Reactive marrow edema about the bilateral L2-3 facets due to facet arthritis, which could contribute to underlying low back pain. 4. Additional facet hypertrophy at L3-4 and L5-S1 without significant stenosis or impingement.     Electronically Signed   By: Jeannine Boga M.D.   On: 05/15/2019 19:50 I, Lynne Leader, personally (independently) visualized and performed the interpretation of the images attached in this note.  Lumbar spine was also imaged on recent CT scan abdomen and pelvis dated Mar 17, 2022.    Assessment and Plan: 61 y.o. female with  Right carpal tunnel syndrome: This is a acute exacerbation of a chronic issue.  Plan for repeat injection today.  Continue night splint and  recheck back as needed.  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  Again this is an acute exacerbation of a chronic problem.  Repeat steroid  injection today.  I can repeat this injection every 3 months.  The new or acute issue today is right leg weakness.  I believe this is due to lumbar radiculopathy.  This would be an acute exacerbation of a chronic issue as well.  She has had epidural steroid injections for this most recently July 2023.  Her MRI however is out of date and with her new worsening neurologic symptoms of weakness we will repeat lumbar spine MRI to further evaluate source of weakness.  Recheck after MRI.   PDMP not reviewed this encounter. Orders Placed This Encounter  Procedures   [place] LIMITED JOINT SPACE STRUCTURES UP LEFT(NO LINKED CHARGES)    Standing Status:   Future    Number of Occurrences:   1    Standing Expiration Date:   08/14/2023    Order Specific Question:   Reason for Exam (SYMPTOM  OR DIAGNOSIS REQUIRED)    Answer:   Bilateral hand pain    Order Specific Question:   Preferred imaging location?    Answer:   [facility]   MR Lumbar Spine Wo Contrast    Standing Status:   Future    Standing Expiration Date:   08/14/2023    Order Specific Question:   What is the patient's sedation requirement?    Answer:   No Sedation    Order Specific Question:   Does the patient have a pacemaker or implanted devices?    Answer:   No    Order Specific Question:   Preferred imaging location?    Answer:   [facility] (table limit-550lbs)   No orders of the defined types were placed in this encounter.    Discussed warning signs or symptoms. Please see discharge instructions. Patient expresses understanding.   The above documentation has been reviewed and is accurate and complete Lynne Leader, M.D.

## 2022-08-28 ENCOUNTER — Ambulatory Visit (INDEPENDENT_AMBULATORY_CARE_PROVIDER_SITE_OTHER): Payer: Medicare Other | Admitting: Student

## 2022-08-28 ENCOUNTER — Encounter: Payer: Self-pay | Admitting: Student

## 2022-08-28 VITALS — BP 118/66 | HR 78 | Ht 62.0 in | Wt 236.0 lb

## 2022-08-28 DIAGNOSIS — I10 Essential (primary) hypertension: Secondary | ICD-10-CM

## 2022-08-28 DIAGNOSIS — R3911 Hesitancy of micturition: Secondary | ICD-10-CM | POA: Diagnosis not present

## 2022-08-28 DIAGNOSIS — M5441 Lumbago with sciatica, right side: Secondary | ICD-10-CM

## 2022-08-28 DIAGNOSIS — R300 Dysuria: Secondary | ICD-10-CM

## 2022-08-28 DIAGNOSIS — G4733 Obstructive sleep apnea (adult) (pediatric): Secondary | ICD-10-CM | POA: Diagnosis not present

## 2022-08-28 LAB — POCT URINALYSIS DIP (MANUAL ENTRY)
Bilirubin, UA: NEGATIVE
Glucose, UA: NEGATIVE mg/dL
Ketones, POC UA: NEGATIVE mg/dL
Nitrite, UA: NEGATIVE
Spec Grav, UA: 1.02 (ref 1.010–1.025)
Urobilinogen, UA: 0.2 E.U./dL
pH, UA: 6 (ref 5.0–8.0)

## 2022-08-28 LAB — POCT UA - MICROSCOPIC ONLY
Epithelial cells, urine per micros: >20
WBC, Ur, HPF, POC: >20 (ref 0–5)

## 2022-08-28 NOTE — Assessment & Plan Note (Signed)
Right lower back. Reproducible with exam, positional in nature. Likely musculoskeletal in nature. No concern for upper tract infection or CVA tenderness as etiology. Encouraged stretching 2-3x daily with topical diclofenac daily.

## 2022-08-28 NOTE — Assessment & Plan Note (Signed)
With patient concern for UTI and hx ESBL E-coli UTI requiring hospitalization and Meropenem (and hx frequet UTI) obtained urine culture. POC dipstick w/ moderate leuks, negative nitrites. F/u culture and will start abx if appropriate. No concern for pyelonephritis- afebrile, well-appearing, no n/v or other sx's.

## 2022-08-28 NOTE — Assessment & Plan Note (Signed)
Well controlled today.

## 2022-08-28 NOTE — Progress Notes (Signed)
    SUBJECTIVE:   CHIEF COMPLAINT / HPI:   Hailey Barnes is a 61 year-old female here for back pain.  Sudden onset right sided pain that goes down the right back since last week. Intermittent. Described as dull pain like "something is there" and back is "pounding sometimes" that hurts worse with bending over and rotating. No falls, injury. No fevers, chills. No pain with urination, but this feels like when she starts to have a UTI. Having a few dribbles of urine for the last 1.5 weeks. And then has to sit on the toilet for a long time to finish urinating.   PERTINENT  PMH / PSH: Reviewed  OBJECTIVE:   BP 118/66   Pulse 78   Ht 5' 2"  (1.575 m)   Wt 236 lb (107 kg)   LMP 04/06/2017   SpO2 96%   BMI 43.16 kg/m   General: Well-appearing, no distress CV: RRR Resp: Normal WOB on room air. No crackles, wheezing. Good air movement throughout. MSK, back: No CVA tenderness. Tender to palpation with palpation of quadratus lumborum. Ext: Warm, well perfused  ASSESSMENT/PLAN:   Hypertension Well-controlled today.  Back pain Right lower back. Reproducible with exam, positional in nature. Likely musculoskeletal in nature. No concern for upper tract infection or CVA tenderness as etiology. Encouraged stretching 2-3x daily with topical diclofenac daily.  Urinary hesitancy With patient concern for UTI and hx ESBL E-coli UTI requiring hospitalization and Meropenem (and hx frequet UTI) obtained urine culture. POC dipstick w/ moderate leuks, negative nitrites. F/u culture and will start abx if appropriate. No concern for pyelonephritis- afebrile, well-appearing, no n/v or other sx's.     Orvis Brill, [place]

## 2022-08-28 NOTE — Patient Instructions (Addendum)
Great to see you today,.  I will call you if your urine shows bacteria and needs treatment with an antibiotic.  For your muscle pain, you may take Tylenol and use topical voltaren gel as needed  Dr. Owens Shark

## 2022-09-01 LAB — URINE CULTURE

## 2022-09-02 ENCOUNTER — Other Ambulatory Visit: Payer: Self-pay | Admitting: Student

## 2022-09-02 MED ORDER — CEFADROXIL 500 MG PO CAPS: 500.0000 mg | ORAL_CAPSULE | Freq: Two times a day (BID) | ORAL | 0 refills | Status: AC

## 2022-09-08 ENCOUNTER — Ambulatory Visit
Admission: RE | Admit: 2022-09-08 | Discharge: 2022-09-08 | Disposition: A | Discharge status: Home / Self Care | Payer: Medicare Other | Source: Ambulatory Visit | Attending: Family Medicine | Admitting: Family Medicine

## 2022-09-08 DIAGNOSIS — M4807 Spinal stenosis, lumbosacral region: Secondary | ICD-10-CM | POA: Diagnosis not present

## 2022-09-08 DIAGNOSIS — M5416 Radiculopathy, lumbar region: Secondary | ICD-10-CM

## 2022-09-08 DIAGNOSIS — M4316 Spondylolisthesis, lumbar region: Secondary | ICD-10-CM | POA: Diagnosis not present

## 2022-09-08 DIAGNOSIS — R200 Anesthesia of skin: Secondary | ICD-10-CM | POA: Diagnosis not present

## 2022-09-08 DIAGNOSIS — M48061 Spinal stenosis, lumbar region without neurogenic claudication: Secondary | ICD-10-CM | POA: Diagnosis not present

## 2022-09-08 DIAGNOSIS — M545 Low back pain, unspecified: Secondary | ICD-10-CM | POA: Diagnosis not present

## 2022-09-08 DIAGNOSIS — R29898 Other symptoms and signs involving the musculoskeletal system: Secondary | ICD-10-CM

## 2022-09-10 ENCOUNTER — Telehealth: Payer: Self-pay | Admitting: Family Medicine

## 2022-09-10 DIAGNOSIS — M5416 Radiculopathy, lumbar region: Secondary | ICD-10-CM

## 2022-09-10 DIAGNOSIS — R29898 Other symptoms and signs involving the musculoskeletal system: Secondary | ICD-10-CM

## 2022-09-10 NOTE — Progress Notes (Signed)
MRI lumbar spine shows multiple areas that could cause back pain and pinched nerves in your back.  I have ordered an back injection which should help.

## 2022-09-10 NOTE — Telephone Encounter (Signed)
Epidural steroid injection ordered 

## 2022-09-15 ENCOUNTER — Ambulatory Visit: Payer: Medicare Other | Admitting: Family Medicine

## 2022-09-15 ENCOUNTER — Encounter: Payer: Self-pay | Admitting: Physician Assistant

## 2022-09-15 NOTE — Progress Notes (Unsigned)
    SUBJECTIVE:   CHIEF COMPLAINT / HPI:   Hailey Barnes is a 61 y.o. female who presents to the [facility] clinic today to discuss the following concerns:   Urinary Symptoms Patient was last seen on 11/9 for symptoms of dysuria and right-sided back pain. She had a UA which showed 2+ leukocytes, negative nitrites but did have >20 epithelial cells. Her urine culture ultimately did grow klebsiella resistant to Ampicillin and indeterminate to Nitro. States that she continues to have symptoms or dysuria. She states that she has urinary urgency but when she goes to the bathroom only a little will come out. She has mostly right-sided back pain "around kidney area" but sometimes it is both sides. Some nausea but no vomiting. She reports that she completed her 7-day course of antibiotics.    She reports that she has had multiple urinary tract infections this year. Hx ESBL E-coli UTI requiring hospitalization in May of this year. Was treated with IV Meropenem and given an additional dose of oral fosfomycin prior to d/c.   PERTINENT  PMH / PSH: Hypertension, HFpEF, OSA, COPD, NASH, type 2 diabetes, hyperlipidemia, depression  OBJECTIVE:   BP (!) 164/92   Pulse 78   Wt 241 lb 6.4 oz (109.5 kg)   LMP 04/06/2017   SpO2 97%   BMI 44.15 kg/m   Vitals:   09/16/22 1127 09/16/22 1202  BP: (!) 164/92 (!) 142/70  Pulse: 78   SpO2: 97%    General: NAD, pleasant, able to participate in exam Respiratory: normal effort Abdomen: Bowel sounds present, mildly tender to suprapubic area without R/G Back: No CVAT. She did have mild ttp to lower back paraspinal w/o deformities. Normal ROM  Extremities: no edema or cyanosis. Psych: Normal affect and mood  ASSESSMENT/PLAN:  1. Dysuria Recently completed 7-day course of abx (unclear which abx she was on, unable to find in chart). Pt will send me a message with the abx since she still has a bottle at home. Reviewed sensitivities and mostly sensitive, will  adjust abx if needed. UA with trace leukocytes, no nitrites and no blood. Recollected Ucx today, will treat again as appropriate. Will also check blood work to ensure there is no AKI. She does endorse only "dribbles" of urine, may need further workup with imaging to r/u stones or hydronephrosis if abnormal. Afebrile and vitals stable, not meeting SIRS criteria.  - Urine Culture - POCT urinalysis dipstick - Basic Metabolic Panel - CBC - Can take AZO for symptomatic relief   Sharion Settler, [place]

## 2022-09-16 ENCOUNTER — Telehealth: Payer: Self-pay

## 2022-09-16 ENCOUNTER — Ambulatory Visit (INDEPENDENT_AMBULATORY_CARE_PROVIDER_SITE_OTHER): Payer: Medicare Other | Admitting: Family Medicine

## 2022-09-16 VITALS — BP 142/70 | HR 78 | Wt 241.4 lb

## 2022-09-16 DIAGNOSIS — Z8619 Personal history of other infectious and parasitic diseases: Secondary | ICD-10-CM

## 2022-09-16 DIAGNOSIS — R300 Dysuria: Secondary | ICD-10-CM

## 2022-09-16 DIAGNOSIS — R3911 Hesitancy of micturition: Secondary | ICD-10-CM | POA: Diagnosis not present

## 2022-09-16 LAB — POCT URINALYSIS DIP (MANUAL ENTRY)
Bilirubin, UA: NEGATIVE
Blood, UA: NEGATIVE
Glucose, UA: NEGATIVE mg/dL
Ketones, POC UA: NEGATIVE mg/dL
Nitrite, UA: NEGATIVE
Protein Ur, POC: NEGATIVE mg/dL
Spec Grav, UA: 1.015 (ref 1.010–1.025)
Urobilinogen, UA: 0.2 E.U./dL
pH, UA: 7 (ref 5.0–8.0)

## 2022-09-16 NOTE — Telephone Encounter (Signed)
Previous Ucx sensitive to cefadroxil. Will await repeat Ucx collected today.

## 2022-09-16 NOTE — Telephone Encounter (Signed)
Patient calls nurse line regarding office visit this morning. She was told to call our office back with the name of the abx she was previously prescribed for UTI.   Patient was prescribed cefadroxil 500 mg BID for seven days on 09/02/22.   Will forward to Dr. Trina Ao, RN

## 2022-09-16 NOTE — Telephone Encounter (Signed)
Pt called and had some questions about her MRI results. Questions were answered and she is already scheduled for ESI on 11/30.

## 2022-09-16 NOTE — Patient Instructions (Signed)
It was wonderful to see you today.  Please bring ALL of your medications with you to every visit.   Today we talked about:  We are doing lab work today to check your blood count and markers for infection as well as your kidney function. We did another urine test today and will await the culture to see if it continues to show an infection. For symptomatic relief you can buy AZO over the counter- keep in mind it will turn your urine orange. I will send you a MyChart message if you have MyChart. Otherwise, I will give you a call for abnormal results or send a letter if everything returned back normal. If you don't hear from me in 2 weeks, please call the office.    When you have a moment, take a picture of the antibiotic you were prescribed and send me a message with the info on MyChart.  Thank you for coming to your visit as scheduled. We have had a large "no-show" problem lately, and this significantly limits our ability to see and care for patients. As a friendly reminder- if you cannot make your appointment please call to cancel. We do have a no show policy for those who do not cancel within 24 hours. Our policy is that if you miss or fail to cancel an appointment within 24 hours, 3 times in a 63-monthperiod, you may be dismissed from our clinic.   Thank you for choosing [facility]   Please call [phone] any questions about today's appointment.  Please be sure to schedule follow up at the front  desk before you leave today.   ASharion Settler DO PGY-3 Family Medicine

## 2022-09-17 LAB — CBC
Hematocrit: 46.2 % (ref 34.0–46.6)
Hemoglobin: 15.9 g/dL (ref 11.1–15.9)
MCH: 29.1 pg (ref 26.6–33.0)
MCHC: 34.4 g/dL (ref 31.5–35.7)
MCV: 85 fL (ref 79–97)
Platelets: 232 10*3/uL (ref 150–450)
RBC: 5.47 x10E6/uL — ABNORMAL HIGH (ref 3.77–5.28)
RDW: 13.7 % (ref 11.7–15.4)
WBC: 12.9 10*3/uL — ABNORMAL HIGH (ref 3.4–10.8)

## 2022-09-17 LAB — BASIC METABOLIC PANEL
BUN/Creatinine Ratio: 16 (ref 9–23)
BUN: 14 mg/dL (ref 6–24)
CO2: 25 mmol/L (ref 20–29)
Calcium: 9.6 mg/dL (ref 8.7–10.2)
Chloride: 103 mmol/L (ref 96–106)
Creatinine, Ser: 0.88 mg/dL (ref 0.57–1.00)
Glucose: 70 mg/dL (ref 70–99)
Potassium: 4.8 mmol/L (ref 3.5–5.2)
Sodium: 142 mmol/L (ref 134–144)
eGFR: 76 mL/min/{1.73_m2} (ref >59)

## 2022-09-18 ENCOUNTER — Ambulatory Visit
Admission: RE | Admit: 2022-09-18 | Discharge: 2022-09-18 | Disposition: A | Discharge status: Home / Self Care | Payer: Medicare Other | Source: Ambulatory Visit | Attending: Family Medicine | Admitting: Family Medicine

## 2022-09-18 DIAGNOSIS — R29898 Other symptoms and signs involving the musculoskeletal system: Secondary | ICD-10-CM

## 2022-09-18 DIAGNOSIS — M5416 Radiculopathy, lumbar region: Secondary | ICD-10-CM

## 2022-09-18 DIAGNOSIS — M5116 Intervertebral disc disorders with radiculopathy, lumbar region: Secondary | ICD-10-CM | POA: Diagnosis not present

## 2022-09-18 LAB — URINE CULTURE

## 2022-09-18 MED ORDER — IOPAMIDOL (ISOVUE-M 200) INJECTION 41%
1.0000 mL | Freq: Once | INTRAMUSCULAR | Status: AC
  Administered 2022-09-18: 1 mL via EPIDURAL

## 2022-09-18 MED ORDER — METHYLPREDNISOLONE ACETATE 40 MG/ML INJ SUSP (RADIOLOG
80.0000 mg | Freq: Once | INTRAMUSCULAR | Status: AC
  Administered 2022-09-18: 80 mg via EPIDURAL

## 2022-09-18 NOTE — Discharge Instructions (Signed)

## 2022-09-30 DIAGNOSIS — R928 Other abnormal and inconclusive findings on diagnostic imaging of breast: Secondary | ICD-10-CM | POA: Diagnosis not present

## 2022-10-06 ENCOUNTER — Telehealth: Payer: Self-pay

## 2022-10-06 NOTE — Patient Outreach (Signed)
  Care Coordination   10/06/2022 Name: Hailey Barnes MRN: [id number] DOB: 1961-05-12   Care Coordination Outreach Attempts:  An unsuccessful telephone outreach was attempted today to offer the patient information about available care coordination services as a benefit of their health plan.   Follow Up Plan:  Additional outreach attempts will be made to offer the patient care coordination information and services.   Encounter Outcome:  No Answer   Care Coordination Interventions:  No, not indicated    Jone Baseman, RN, MSN [facility] Care Management Coordinator Direct Line [phone]

## 2022-10-14 ENCOUNTER — Other Ambulatory Visit (INDEPENDENT_AMBULATORY_CARE_PROVIDER_SITE_OTHER): Payer: Medicare Other

## 2022-10-14 ENCOUNTER — Ambulatory Visit (INDEPENDENT_AMBULATORY_CARE_PROVIDER_SITE_OTHER): Payer: Medicare Other | Admitting: Physician Assistant

## 2022-10-14 ENCOUNTER — Other Ambulatory Visit: Payer: Self-pay | Admitting: Student

## 2022-10-14 ENCOUNTER — Encounter: Payer: Self-pay | Admitting: Physician Assistant

## 2022-10-14 ENCOUNTER — Ambulatory Visit (INDEPENDENT_AMBULATORY_CARE_PROVIDER_SITE_OTHER)
Admission: RE | Admit: 2022-10-14 | Discharge: 2022-10-14 | Disposition: A | Discharge status: Home / Self Care | Payer: Medicare Other | Source: Ambulatory Visit | Attending: Physician Assistant | Admitting: Physician Assistant

## 2022-10-14 VITALS — BP 150/98 | HR 70 | Ht 62.0 in | Wt 242.2 lb

## 2022-10-14 DIAGNOSIS — R1013 Epigastric pain: Secondary | ICD-10-CM

## 2022-10-14 DIAGNOSIS — R1011 Right upper quadrant pain: Secondary | ICD-10-CM

## 2022-10-14 DIAGNOSIS — R079 Chest pain, unspecified: Secondary | ICD-10-CM | POA: Diagnosis not present

## 2022-10-14 DIAGNOSIS — J449 Chronic obstructive pulmonary disease, unspecified: Secondary | ICD-10-CM

## 2022-10-14 DIAGNOSIS — R0781 Pleurodynia: Secondary | ICD-10-CM

## 2022-10-14 DIAGNOSIS — R0789 Other chest pain: Secondary | ICD-10-CM

## 2022-10-14 LAB — CBC WITH DIFFERENTIAL/PLATELET
Basophils Absolute: 0.1 10*3/uL (ref 0.0–0.1)
Basophils Relative: 0.6 % (ref 0.0–3.0)
Eosinophils Absolute: 0.1 10*3/uL (ref 0.0–0.7)
Eosinophils Relative: 1.4 % (ref 0.0–5.0)
HCT: 45 % (ref 36.0–46.0)
Hemoglobin: 15 g/dL (ref 12.0–15.0)
Lymphocytes Relative: 20.5 % (ref 12.0–46.0)
Lymphs Abs: 2.2 10*3/uL (ref 0.7–4.0)
MCHC: 33.3 g/dL (ref 30.0–36.0)
MCV: 88.3 fl (ref 78.0–100.0)
Monocytes Absolute: 1 10*3/uL (ref 0.1–1.0)
Monocytes Relative: 9.7 % (ref 3.0–12.0)
Neutro Abs: 7.2 10*3/uL (ref 1.4–7.7)
Neutrophils Relative %: 67.8 % (ref 43.0–77.0)
Platelets: 178 10*3/uL (ref 150.0–400.0)
RBC: 5.09 Mil/uL (ref 3.87–5.11)
RDW: 15 % (ref 11.5–15.5)
WBC: 10.6 10*3/uL — ABNORMAL HIGH (ref 4.0–10.5)

## 2022-10-14 LAB — COMPREHENSIVE METABOLIC PANEL
ALT: 15 U/L (ref 0–35)
AST: 14 U/L (ref 0–37)
Albumin: 3.8 g/dL (ref 3.5–5.2)
Alkaline Phosphatase: 80 U/L (ref 39–117)
BUN: 14 mg/dL (ref 6–23)
CO2: 28 mEq/L (ref 19–32)
Calcium: 9.4 mg/dL (ref 8.4–10.5)
Chloride: 107 mEq/L (ref 96–112)
Creatinine, Ser: 1 mg/dL (ref 0.40–1.20)
GFR: 61.62 mL/min (ref >60.00)
Glucose, Bld: 82 mg/dL (ref 70–99)
Potassium: 3.6 mEq/L (ref 3.5–5.1)
Sodium: 141 mEq/L (ref 135–145)
Total Bilirubin: 0.4 mg/dL (ref 0.2–1.2)
Total Protein: 6.5 g/dL (ref 6.0–8.3)

## 2022-10-14 LAB — SEDIMENTATION RATE: Sed Rate: 21 mm/hr (ref 0–30)

## 2022-10-14 MED ORDER — METHYLPREDNISOLONE 4 MG PO TBPK: ORAL_TABLET | ORAL | 0 refills | Status: DC

## 2022-10-14 MED ORDER — PANTOPRAZOLE SODIUM 40 MG PO TBEC: DELAYED_RELEASE_TABLET | ORAL | 1 refills | Status: DC

## 2022-10-14 NOTE — Patient Instructions (Addendum)
_______________________________________________________  If you are age 59 or older, your body mass index should be between 23-30. Your Body mass index is 44.3 kg/m. If this is out of the aforementioned range listed, please consider follow up with your Primary Care Provider.  If you are age 59 or younger, your body mass index should be between 19-25. Your Body mass index is 44.3 kg/m. If this is out of the aformentioned range listed, please consider follow up with your Primary Care Provider.   ________________________________________________________  The [facility] providers would like to encourage you to use [facility] to communicate with providers for non-urgent requests or questions.  Due to long hold times on the telephone, sending your provider a message by [facility] may be a faster and more efficient way to get a response.  Please allow 48 business hours for a response.  Please remember that this is for non-urgent requests.  _______________________________________________________  We have sent the following medications to your pharmacy for you to pick up at your convenience:   Your provider has requested that you go to the basement level for lab work before leaving today. Press "B" on the elevator. The lab is located at the first door on the left as you exit the elevator.  Your provider has requested that you have an chest x ray before leaving today. Please go to the basement floor to our Radiology department for the test.    Take ibuprofen twice daily with food.  Medrol Dosepak take as directed DO NOT TAKE IBUPROFEN WHILE USING   You have been scheduled for an abdominal ultrasound at [facility] (1st floor of hospital) on Wednesday at 8:30 am. Please arrive 30 minutes prior to your appointment for registration. Make certain not to have anything to eat or drink 6 hours prior to your appointment. Should you need to reschedule your appointment, please contact radiology at  [phone]. This test typically takes about 30 minutes to perform.  Due to recent changes in healthcare laws, you may see the results of your imaging and laboratory studies on MyChart before your provider has had a chance to review them.  We understand that in some cases there may be results that are confusing or concerning to you. Not all laboratory results come back in the same time frame and the provider may be waiting for multiple results in order to interpret others.  Please give [place] 48 hours in order for your provider to thoroughly review all the results before contacting the office for clarification of your results.

## 2022-10-14 NOTE — Progress Notes (Signed)
Subjective:    Patient ID: Hailey Barnes, female    DOB: Mar 13, 1961, 61 y.o.   MRN: [id number]  HPI Lillyian is a pleasant 61 year old African-American female, established with Dr. Havery Moros.  She was last seen here in October 2020 at that time with complaints of right upper quadrant pain which was felt to be consistent with abdominal wall pain and she was treated with a capsaicin cream. She has had prior EGD which was negative in July 2018 including negative biopsies for H. pylori, she status post laparoscopic cholecystectomy August 2018, and had colonoscopy in July 2018 with finding of diverticulosis and 1 small sessile serrated polyp with indication to follow-up in 5 years.  She says as far she recalls the pain she was seen here for in 2020 had resolved and she had not been bothered for some time but had recurrence of similar type symptoms about 2 months ago.  She was seen by her PCP who said that her pain is musculoskeletal most likely due to a pulled muscle.  However she says that the pain has gradually gotten worse, and she does not feel that this pain is consistent with a pulled muscle and therefore made appointment here for further evaluation. She is not aware of any injury fall etc.  She says the pain is constant, dull, located high in the right upper quadrant and then around into the right back, it is exacerbated by lying on her right side, and twisting or turning in certain ways.  She also has been having some epigastric discomfort which starts immediately after eating and feels that her abdomen "knots up". She had been on Ozempic which was a new medicine for her, she stopped that about 2 weeks ago but has not had any change in the epigastric discomfort since then. She has not been on a PPI over the past couple of years and says she really had not had any problems with GERD symptoms and denies any dysphagia or odynophagia.  No regular NSAID use. The medical issues include  hypertension, congestive heart failure with preserved EF, sleep apnea, COPD, NASH, adult onset diabetes mellitus, osteoarthritis, peripheral neuropathy and obesity.  Review of Systems Pertinent positive and negative review of systems were noted in the above HPI section.  All other review of systems was otherwise negative.   Outpatient Encounter Medications as of 10/14/2022  Medication Sig   allopurinol (ZYLOPRIM) 300 MG tablet TAKE 1 TABLET BY MOUTH EVERY DAY   amLODipine (NORVASC) 2.5 MG tablet Take 1 tablet (2.5 mg total) by mouth at bedtime.   aspirin EC 81 MG tablet Take 81 mg by mouth daily.   BD PEN NEEDLE NANO U/F 32G X 4 MM MISC USE 3 TIMES A DAY   Cholecalciferol (VITAMIN D) 50 MCG (2000 UT) tablet Take 4,000 Units by mouth daily.   conjugated estrogens (PREMARIN) vaginal cream Place 1 Applicatorful vaginally daily.   Fluticasone-Umeclidin-Vilant (TRELEGY ELLIPTA) 200-62.5-25 MCG/ACT AEPB Use as directed 1 puff in the mouth or throat daily. TAKE 1 PUFF BY MOUTH EVERY DAY Strength: 200-62.5-25 MCG/ACT   glucose blood (ACCU-CHEK AVIVA) test strip Use to check sugar up to 3 times daily   hydrALAZINE (APRESOLINE) 25 MG tablet Take 2 tablets (50 mg total) by mouth 3 (three) times daily.   ibuprofen (ADVIL) 200 MG tablet Take 400 mg by mouth every 6 (six) hours as needed for moderate pain.   lidocaine-prilocaine (EMLA) cream    losartan (COZAAR) 100 MG tablet TAKE 1 TABLET  BY MOUTH EVERY DAY   Melaton-Thean-Cham-PassF-LBalm (MELATONIN + L-THEANINE) CAPS Take 2 capsules by mouth at bedtime as needed (sleep).   methylPREDNISolone (MEDROL DOSEPAK) 4 MG TBPK tablet Use as directed   Multiple Vitamins-Minerals (MULTIVITAMIN GUMMIES ADULT PO) Take 2 capsules by mouth daily.   ondansetron (ZOFRAN-ODT) 8 MG disintegrating tablet Take 1 tablet by mouth every 8 (eight) hours as needed.   pantoprazole (PROTONIX) 40 MG tablet One tablet 30 min before breakfast   pindolol (VISKEN) 10 MG tablet Take 2  tablets (20 mg total) by mouth 2 (two) times daily.   Polyvinyl Alcohol-Povidone (CLEAR EYES ALL SEASONS OP) Place 1 drop into both eyes daily as needed (allergies/itchy eyes).   spironolactone (ALDACTONE) 100 MG tablet Take 1 tablet (100 mg total) by mouth at bedtime.   torsemide (DEMADEX) 20 MG tablet Take 1 tablet (20 mg total) by mouth every other day.   triamcinolone (KENALOG) 0.025 % ointment APPLY 1 APPLICATION TOPICALLY 2 (TWO) TIMES DAILY AS NEEDED. (Patient taking differently: Apply 1 application  topically 2 (two) times daily as needed (eczema).)   Accu-Chek Softclix Lancets lancets Use to check blood sugar once daily. E11.9   albuterol (VENTOLIN HFA) 108 (90 Base) MCG/ACT inhaler INHALE 1-2 PUFFS BY MOUTH EVERY 6 HOURS AS NEEDED FOR WHEEZE OR SHORTNESS OF BREATH (Patient taking differently: 2 puffs every 6 (six) hours as needed for wheezing or shortness of breath.)   atorvastatin (LIPITOR) 40 MG tablet Take 1 tablet (40 mg total) by mouth daily. (Patient not taking: Reported on 10/14/2022)   OZEMPIC, 0.25 OR 0.5 MG/DOSE, 2 MG/3ML SOPN INJECT 0.5 MG INTO THE SKIN ONCE A WEEK. 0.25 MG ONCE WEEKLY FOR 4 WEEKS THEN INCREASE TO 0.5 MG WEEKLY FOR AT LEAST 4 WEEKS,MAX 1 MG (Patient not taking: Reported on 10/14/2022)   No facility-administered encounter medications on file as of 10/14/2022.   Allergies  Allergen Reactions   Bupropion Other (See Comments)    Throat soreness and difficulty swallowing.    Norvasc [Amlodipine] Swelling    Significant leg swelling with 76m. Able to tolerate 2.558m    Patient Active Problem List   Diagnosis Date Noted   Urinary hesitancy 08/28/2022   Peripheral neuropathy 08/12/2022   UTI due to extended-spectrum beta lactamase (ESBL) producing Escherichia coli 03/16/2022   Breast mass in female 10/23/2021   Melanosis 11/07/2020   Lesion of cervix 07/05/2020   COPD (chronic obstructive pulmonary disease) ([facility]0495/06/3266 [place] of knee, left 1012/45/8099  Chondromalacia, left knee 08/16/2018   Primary osteoarthritis of both knees 03/24/2018   Bilateral primary osteoarthritis of first carpometacarpal joints 11/16/2017   Carpal tunnel syndrome, bilateral 10/21/2017   OSA (obstructive sleep apnea) 10/30/2016   Left lumbar radiculopathy 06/02/2016   Hyperlipidemia 02/07/2016   T2DM (type 2 diabetes mellitus) ([facility]02/17/2017   Depression 04/19/2015   Diminished vision 06/23/2014   (HFpEF) heart failure with preserved ejection fraction (HCD[place]08/25/2015   Restrictive lung disease 05/10/2014   Vaginal discharge 03/22/2014   Back pain 01/26/2014   NASH (nonalcoholic steatohepatitis) 01/04/2014   Hypertension 05/26/2010   Morbid obesity (HCIonia02/28/2008   Tobacco use disorder 12/17/2006   Social History   Socioeconomic History   Marital status: Divorced    Spouse name: Not on file   Number of children: 1   Years of education: 12   Highest education level: 12th grade  Occupational History   Occupation: maPsychologist, educational  Comment: disabled   Occupation: patient care  assistance  Tobacco Use   Smoking status: Every Day    Packs/day: 0.50    Years: 42.00    Total pack years: 21.00    Types: Cigarettes    Start date: 10/20/1976   Smokeless tobacco: Never   Tobacco comments:    Previous 1 ppd smoker x > 40 years.  Now smokes 10 cigarettes per day  Vaping Use   Vaping Use: Never used  Substance and Sexual Activity   Alcohol use: Yes    Comment: very occasional   Drug use: Yes    Types: "Crack" cocaine, Marijuana    Comment: 03/30/2014 "stopped using crack 02/21/2014"   Sexual activity: Yes    Birth control/protection: Post-menopausal  Other Topics Concern   Not on file  Social History Narrative   Divorced since 2015.   Lives alone. No pets. Daughter lives in [place]. Daughter helps with transportation. Talks daily on phone, sees once or twice a week. 2 grandkids.   Enjoys church at [employer], [place] geared towards  people with substance abuse history.    Lives in duplex apartment, one level, three stairs to get in. Has handrails, has grab bars in bathroom. Smoke alarms.   Bakes most meals, avoids starches, rice. Eats vegetables. Drinks tea, ginger ale.    Likes to go to movies, used to walk daily but not now due to knee pain. Likes to spend time with grandchildren, go out to eat. Spend time with family and circle of friends.    Social Determinants of Health   Financial Resource Strain: Low Risk  (09/07/2018)   Overall Financial Resource Strain (CARDIA)    Difficulty of Paying Living Expenses: Not hard at all  Food Insecurity: Food Insecurity Present (09/07/2018)   Hunger Vital Sign    Worried About Running Out of Food in the Last Year: Sometimes true    Ran Out of Food in the Last Year: Sometimes true  Transportation Needs: Unmet Transportation Needs (09/07/2018)   PRAPARE - Hydrologist (Medical): Yes    Lack of Transportation (Non-Medical): Yes  Physical Activity: Inactive (09/07/2018)   Exercise Vital Sign    Days of Exercise per Week: 0 days    Minutes of Exercise per Session: 0 min  Stress: No Stress Concern Present (09/07/2018)   [place]    Feeling of Stress : Not at all  Social Connections: Somewhat Isolated (09/07/2018)   Social Connection and Isolation Panel [NHANES]    Frequency of Communication with Friends and Family: More than three times a week    Frequency of Social Gatherings with Friends and Family: Never    Attends Religious Services: More than 4 times per year    Active Member of [employer] or Organizations: No    Attends Archivist Meetings: Never    Marital Status: Divorced  Human resources officer Violence: Not At Risk (09/07/2018)   Humiliation, Afraid, Rape, and Kick questionnaire    Fear of Current or Ex-Partner: No    Emotionally Abused: No    Physically Abused: No     Sexually Abused: No    Ms. Cullins's family history includes COPD in her mother and sister; Cancer in her sister; Hypertension in her father, mother, sister, and sister.      Objective:    Vitals:   10/14/22 1359  BP: (!) 150/98  Pulse: 70  SpO2: 96%    Physical Exam Well-developed obese older  African-American female in no acute distress.  Height, Weight, 242 BMI 44.3  HEENT; nontraumatic normocephalic, EOMI, PE R LA, sclera anicteric. Oropharynx; not examined today Neck; supple, no JVD Cardiovascular; regular rate and rhythm with S1-S2, no murmur rub or gallop Pulmonary; Clear bilaterally-she is quite tender to palpation along the right lower anterior and lateral ribs Abdomen; soft, minimal tenderness in the right upper quadrant no guarding ,nondistended, no palpable mass or hepatosplenomegaly, bowel sounds are active Rectal; not done today Skin; benign exam, no jaundice rash or appreciable lesions Extremities; no clubbing cyanosis or edema skin warm and dry Neuro/Psych; alert and oriented x4, grossly nonfocal mood and affect appropriate        Assessment & Plan:   #82 61 year old African-American female with 2-monthhistory of recurrent right upper quadrant, right lateral into the back pain which on exam is consistent with rib pain at the costal margin/costochondritis.  She is status post cholecystectomy, has had some complaints of epigastric pain which may be a separate etiology.  Had recently started Ozempic which she has been off of now over the past couple of weeks. Consider component of gastropathy, dyspepsia, doubt choledocholithiasis.  #2 history of NKarlene Lineman#3 history of small sessile serrated polyp-last colonoscopy July 2018 at that time indicated for 5-year interval follow-up-current recommendation would be for 7-year interval follow-up #4 diverticulosis 5 adult onset diabetes mellitus 6.  Sleep apnea 7.  COPD 8.  Hypertension 9.  Obesity  Plan CBC with  differential, c-Met, sed rate Scheduled for upper abdominal ultrasound this will also be helpful to reassess NASH/fatty liver Will give her a Medrol Dosepak after she completes the Dosepak she can try Advil or Aleve 1 p.o. twice daily short-term to be taken with food Restart Protonix 40 mg p.o. every morning x 1 month Discussed use of a heating pad to the right lower and lateral ribs as well as trial of a over-the-counter lidocaine gel i.e. Salonpas or other similar product 3- 4 times daily The recommendations pending labs and ultrasound and then response to Dosepak. Patient also requested chest x-ray which will be done today  AAlfredia FergusonPA-C 10/14/2022   Cc: NAlen Bleacher MD

## 2022-10-15 ENCOUNTER — Ambulatory Visit (HOSPITAL_COMMUNITY)
Admission: RE | Admit: 2022-10-15 | Discharge: 2022-10-15 | Disposition: A | Discharge status: Home / Self Care | Payer: Medicare Other | Source: Ambulatory Visit | Attending: Physician Assistant | Admitting: Physician Assistant

## 2022-10-15 DIAGNOSIS — R0781 Pleurodynia: Secondary | ICD-10-CM | POA: Insufficient documentation

## 2022-10-15 DIAGNOSIS — R1013 Epigastric pain: Secondary | ICD-10-CM | POA: Insufficient documentation

## 2022-10-15 DIAGNOSIS — R1011 Right upper quadrant pain: Secondary | ICD-10-CM | POA: Insufficient documentation

## 2022-10-15 NOTE — Progress Notes (Signed)
Agree with assessment and plan as outlined.  

## 2022-10-23 ENCOUNTER — Telehealth: Payer: Self-pay

## 2022-10-23 NOTE — Patient Instructions (Signed)
Visit Information  Thank you for taking time to visit with me today. Please don't hesitate to contact me if I can be of assistance to you.   Following are the goals we discussed today:   Goals Addressed             This Visit's Progress    COMPLETED: Care coordination activities-No follow up required       Care Coordination Interventions: Advised patient to Annual Wellness exam. Discussed [facility] services and support. Assessed SDOH. Advised to discuss with primary care physician if services needed in the future.          If you are experiencing a Mental Health or [place] or need someone to talk to, please call the Suicide and Crisis Lifeline: 988   Patient verbalizes understanding of instructions and care plan provided today and agrees to view in [place]. Active MyChart status and patient understanding of how to access instructions and care plan via MyChart confirmed with patient.     No further follow up required:    Jone Baseman, RN, MSN [place] [facility] Care Management Coordinator Direct Line [phone]

## 2022-10-23 NOTE — Patient Outreach (Signed)
  Care Coordination   Initial Visit Note   10/23/2022 Name: Micheline Markes MRN: [id number] DOB: 04/11/1961  Ivyrose Hashman is a 62 y.o. year old female who sees Alen Bleacher, MD for primary care. I spoke with  [name] by phone today.  What matters to the patients health and wellness today?  none    Goals Addressed             This Visit's Progress    COMPLETED: Care coordination activities-No follow up required       Care Coordination Interventions: Advised patient to Annual Wellness exam. Discussed [facility] services and support. Assessed SDOH. Advised to discuss with primary care physician if services needed in the future.          SDOH assessments and interventions completed:  Yes  SDOH Interventions Today    Flowsheet Row Most Recent Value  SDOH Interventions   Transportation Interventions Intervention Not Indicated  Utilities Interventions Intervention Not Indicated        Care Coordination Interventions:  Yes, provided   Follow up plan: No further intervention required.   Encounter Outcome:  Pt. Visit Completed   Jone Baseman, RN, MSN [place] [facility] Care Management Coordinator Direct Line [phone]

## 2022-10-29 ENCOUNTER — Other Ambulatory Visit: Payer: Self-pay | Admitting: Family Medicine

## 2022-10-29 DIAGNOSIS — I10 Essential (primary) hypertension: Secondary | ICD-10-CM

## 2022-11-05 ENCOUNTER — Other Ambulatory Visit: Payer: Self-pay | Admitting: Physician Assistant

## 2022-11-07 ENCOUNTER — Other Ambulatory Visit: Payer: Self-pay

## 2022-11-07 ENCOUNTER — Emergency Department (HOSPITAL_COMMUNITY): Payer: 59

## 2022-11-07 ENCOUNTER — Encounter (HOSPITAL_COMMUNITY): Payer: Self-pay

## 2022-11-07 ENCOUNTER — Emergency Department (HOSPITAL_COMMUNITY)
Admission: EM | Admit: 2022-11-07 | Discharge: 2022-11-07 | Disposition: A | Discharge status: Home / Self Care | Payer: 59 | Attending: Emergency Medicine | Admitting: Emergency Medicine

## 2022-11-07 DIAGNOSIS — K5792 Diverticulitis of intestine, part unspecified, without perforation or abscess without bleeding: Secondary | ICD-10-CM

## 2022-11-07 DIAGNOSIS — K5732 Diverticulitis of large intestine without perforation or abscess without bleeding: Secondary | ICD-10-CM | POA: Diagnosis not present

## 2022-11-07 DIAGNOSIS — N390 Urinary tract infection, site not specified: Secondary | ICD-10-CM | POA: Insufficient documentation

## 2022-11-07 DIAGNOSIS — Z7982 Long term (current) use of aspirin: Secondary | ICD-10-CM | POA: Diagnosis not present

## 2022-11-07 DIAGNOSIS — K5730 Diverticulosis of large intestine without perforation or abscess without bleeding: Secondary | ICD-10-CM | POA: Diagnosis not present

## 2022-11-07 DIAGNOSIS — R1084 Generalized abdominal pain: Secondary | ICD-10-CM | POA: Diagnosis present

## 2022-11-07 LAB — COMPREHENSIVE METABOLIC PANEL
ALT: 15 U/L (ref 0–44)
AST: 14 U/L — ABNORMAL LOW (ref 15–41)
Albumin: 3.5 g/dL (ref 3.5–5.0)
Alkaline Phosphatase: 83 U/L (ref 38–126)
Anion gap: 10 (ref 5–15)
BUN: 9 mg/dL (ref 6–20)
CO2: 23 mmol/L (ref 22–32)
Calcium: 9 mg/dL (ref 8.9–10.3)
Chloride: 105 mmol/L (ref 98–111)
Creatinine, Ser: 0.85 mg/dL (ref 0.44–1.00)
GFR, Estimated: >60 mL/min (ref >60)
Glucose, Bld: 116 mg/dL — ABNORMAL HIGH (ref 70–99)
Potassium: 3.4 mmol/L — ABNORMAL LOW (ref 3.5–5.1)
Sodium: 138 mmol/L (ref 135–145)
Total Bilirubin: 0.4 mg/dL (ref 0.3–1.2)
Total Protein: 7 g/dL (ref 6.5–8.1)

## 2022-11-07 LAB — CBC WITH DIFFERENTIAL/PLATELET
Abs Immature Granulocytes: 0.07 10*3/uL (ref 0.00–0.07)
Basophils Absolute: 0.1 10*3/uL (ref 0.0–0.1)
Basophils Relative: 0 %
Eosinophils Absolute: 0.1 10*3/uL (ref 0.0–0.5)
Eosinophils Relative: 1 %
HCT: 48.6 % — ABNORMAL HIGH (ref 36.0–46.0)
Hemoglobin: 16 g/dL — ABNORMAL HIGH (ref 12.0–15.0)
Immature Granulocytes: 0 %
Lymphocytes Relative: 15 %
Lymphs Abs: 2.4 10*3/uL (ref 0.7–4.0)
MCH: 28.5 pg (ref 26.0–34.0)
MCHC: 32.9 g/dL (ref 30.0–36.0)
MCV: 86.6 fL (ref 80.0–100.0)
Monocytes Absolute: 1.2 10*3/uL — ABNORMAL HIGH (ref 0.1–1.0)
Monocytes Relative: 7 %
Neutro Abs: 12.4 10*3/uL — ABNORMAL HIGH (ref 1.7–7.7)
Neutrophils Relative %: 77 %
Platelets: 226 10*3/uL (ref 150–400)
RBC: 5.61 MIL/uL — ABNORMAL HIGH (ref 3.87–5.11)
RDW: 13.9 % (ref 11.5–15.5)
WBC: 16.2 10*3/uL — ABNORMAL HIGH (ref 4.0–10.5)
nRBC: 0 % (ref 0.0–0.2)

## 2022-11-07 LAB — URINALYSIS, ROUTINE W REFLEX MICROSCOPIC
Bilirubin Urine: NEGATIVE
Glucose, UA: NEGATIVE mg/dL
Ketones, ur: NEGATIVE mg/dL
Nitrite: POSITIVE — AB
Protein, ur: NEGATIVE mg/dL
Specific Gravity, Urine: 1.035 — ABNORMAL HIGH (ref 1.005–1.030)
pH: 6 (ref 5.0–8.0)

## 2022-11-07 LAB — LIPASE, BLOOD: Lipase: 31 U/L (ref 11–51)

## 2022-11-07 MED ORDER — ONDANSETRON HCL 4 MG/2ML IJ SOLN
4.0000 mg | Freq: Once | INTRAMUSCULAR | Status: AC
  Administered 2022-11-07: 4 mg via INTRAVENOUS
  Filled 2022-11-07: qty 2

## 2022-11-07 MED ORDER — SODIUM CHLORIDE 0.9 % IV SOLN: 3.0000 g | Freq: Once | INTRAVENOUS | Status: DC

## 2022-11-07 MED ORDER — METRONIDAZOLE 500 MG PO TABS: 500.0000 mg | ORAL_TABLET | Freq: Three times a day (TID) | ORAL | 0 refills | Status: DC

## 2022-11-07 MED ORDER — MORPHINE SULFATE (PF) 4 MG/ML IV SOLN
4.0000 mg | Freq: Once | INTRAVENOUS | Status: AC
  Administered 2022-11-07: 4 mg via INTRAVENOUS
  Filled 2022-11-07: qty 1

## 2022-11-07 MED ORDER — IBUPROFEN 800 MG PO TABS: 800.0000 mg | ORAL_TABLET | Freq: Three times a day (TID) | ORAL | 0 refills | Status: AC | PRN

## 2022-11-07 MED ORDER — SODIUM CHLORIDE (PF) 0.9 % IJ SOLN
INTRAMUSCULAR | Status: AC
  Filled 2022-11-07: qty 50

## 2022-11-07 MED ORDER — SODIUM CHLORIDE 0.9 % IV SOLN
2.0000 g | Freq: Once | INTRAVENOUS | Status: AC
  Administered 2022-11-07: 2 g via INTRAVENOUS
  Filled 2022-11-07: qty 20

## 2022-11-07 MED ORDER — SODIUM CHLORIDE 0.9 % IV BOLUS
500.0000 mL | Freq: Once | INTRAVENOUS | Status: AC
  Administered 2022-11-07: 500 mL via INTRAVENOUS

## 2022-11-07 MED ORDER — METRONIDAZOLE 500 MG/100ML IV SOLN
500.0000 mg | Freq: Once | INTRAVENOUS | Status: AC
  Administered 2022-11-07: 500 mg via INTRAVENOUS
  Filled 2022-11-07: qty 100

## 2022-11-07 MED ORDER — IOHEXOL 300 MG/ML  SOLN
100.0000 mL | Freq: Once | INTRAMUSCULAR | Status: AC | PRN
  Administered 2022-11-07: 100 mL via INTRAVENOUS

## 2022-11-07 MED ORDER — CEFDINIR 300 MG PO CAPS: 300.0000 mg | ORAL_CAPSULE | Freq: Two times a day (BID) | ORAL | 0 refills | Status: DC

## 2022-11-07 NOTE — ED Triage Notes (Signed)
Pt states that her "cootie hurts and I think I have a UTI". Pt has diabetes and states that this has happened before. Pt reports taking all medications as ordered.

## 2022-11-07 NOTE — ED Provider Notes (Signed)
[facility] DEPT Provider Note   CSN: [id number] Arrival date & time: 11/07/22  0253     History  Chief Complaint  Patient presents with   Abdominal Pain   Vaginal Pain    Hailey Barnes is a 62 y.o. female.  Patient presents to the emergency department for evaluation of diffuse abdominal pain.  Patient reports that she has now noticed that she is having urinary frequency, small urine volume and has a fair amount of pain when she passes her urine.  This feels similar to prior UTI.  Current pain is across the upper abdomen diffusely.  She also has not had a bowel movement in 2 days and reports that when she tries to have a bowel movement, her rectum hurts as well.       Home Medications Prior to Admission medications   Medication Sig Start Date End Date Taking? Authorizing Provider  cefdinir (OMNICEF) 300 MG capsule Take 1 capsule (300 mg total) by mouth 2 (two) times daily. 11/07/22  Yes Walterine Amodei, Gwenyth Allegra, MD  ibuprofen (ADVIL) 800 MG tablet Take 1 tablet (800 mg total) by mouth every 8 (eight) hours as needed for moderate pain. 11/07/22  Yes Emmalynne Courtney, Gwenyth Allegra, MD  metroNIDAZOLE (FLAGYL) 500 MG tablet Take 1 tablet (500 mg total) by mouth 3 (three) times daily. 11/07/22  Yes Jamesa Tedrick, Gwenyth Allegra, MD  Accu-Chek Softclix Lancets lancets Use to check blood sugar once daily. E11.9 07/24/22   Alen Bleacher, MD  albuterol (VENTOLIN HFA) 108 (90 Base) MCG/ACT inhaler INHALE 1-2 PUFFS BY MOUTH EVERY 6 HOURS AS NEEDED FOR WHEEZE OR SHORTNESS OF BREATH Patient taking differently: 2 puffs every 6 (six) hours as needed for wheezing or shortness of breath. 08/07/21   Lurline Del, DO  allopurinol (ZYLOPRIM) 300 MG tablet TAKE 1 TABLET BY MOUTH EVERY DAY 04/15/22   Lurline Del, DO  amLODipine (NORVASC) 2.5 MG tablet TAKE 1 TABLET BY MOUTH AT BEDTIME. 10/29/22   Alen Bleacher, MD  aspirin EC 81 MG tablet Take 81 mg by mouth daily.    [provider]  atorvastatin (LIPITOR) 40 MG tablet Take 1 tablet (40 mg total) by mouth daily. 07/24/22   Alen Bleacher, MD  BD PEN NEEDLE NANO U/F 32G X 4 MM MISC USE 3 TIMES A DAY 09/19/21   Lurline Del, DO  Cholecalciferol (VITAMIN D) 50 MCG (2000 UT) tablet Take 4,000 Units by mouth daily.    [provider]  conjugated estrogens (PREMARIN) vaginal cream Place 1 Applicatorful vaginally daily. 03/26/22   Lurline Del, DO  glucose blood (ACCU-CHEK AVIVA) test strip Use to check sugar up to 3 times daily 04/14/19   Zenia Resides, MD  hydrALAZINE (APRESOLINE) 25 MG tablet Take 2 tablets (50 mg total) by mouth 3 (three) times daily. 03/18/22   Aline August, MD  ibuprofen (ADVIL) 200 MG tablet Take 400 mg by mouth every 6 (six) hours as needed for moderate pain.    [provider]  lidocaine-prilocaine (EMLA) cream     [provider]  losartan (COZAAR) 100 MG tablet TAKE 1 TABLET BY MOUTH EVERY DAY 05/26/22   Alen Bleacher, MD  Melaton-Thean-Cham-PassF-LBalm (MELATONIN + L-THEANINE) CAPS Take 2 capsules by mouth at bedtime as needed (sleep).    [provider]  methylPREDNISolone (MEDROL DOSEPAK) 4 MG TBPK tablet Use as directed Patient not taking: Reported on 10/23/2022 10/14/22   Esterwood, Amy S, PA-C  Multiple Vitamins-Minerals (MULTIVITAMIN GUMMIES ADULT PO) Take  2 capsules by mouth daily.    [provider]  ondansetron (ZOFRAN-ODT) 8 MG disintegrating tablet Take 1 tablet by mouth every 8 (eight) hours as needed.    [provider]  OZEMPIC, 0.25 OR 0.5 MG/DOSE, 2 MG/3ML SOPN INJECT 0.5 MG INTO THE SKIN ONCE A WEEK. 0.25 MG ONCE WEEKLY FOR 4 WEEKS THEN INCREASE TO 0.5 MG WEEKLY FOR AT LEAST 4 WEEKS,MAX 1 MG Patient not taking: Reported on 10/14/2022 06/26/22   Alen Bleacher, MD  pantoprazole (PROTONIX) 40 MG tablet ONE TABLET 30 MIN BEFORE BREAKFAST 11/05/22   Esterwood, Amy S, PA-C  pindolol (VISKEN) 10 MG tablet Take 2 tablets (20 mg total)  by mouth 2 (two) times daily. 07/22/22 07/23/23  Marylu Lund., NP  Polyvinyl Alcohol-Povidone (CLEAR EYES ALL SEASONS OP) Place 1 drop into both eyes daily as needed (allergies/itchy eyes).    [provider]  spironolactone (ALDACTONE) 100 MG tablet Take 1 tablet (100 mg total) by mouth at bedtime. 03/28/21   Zenia Resides, MD  torsemide (DEMADEX) 20 MG tablet Take 1 tablet (20 mg total) by mouth every other day. 03/18/22   Aline August, MD  [place] 200-62.5-25 MCG/ACT AEPB USE AS DIRECTED 1 PUFF IN THE MOUTH OR THROAT DAILY. TAKE 1 PUFF BY MOUTH EVERY DAY STRENGTH: 200-62.5-25 MCG/ACT 10/15/22   Holley Bouche, MD  triamcinolone (KENALOG) 0.025 % ointment APPLY 1 APPLICATION TOPICALLY 2 (TWO) TIMES DAILY AS NEEDED. Patient taking differently: Apply 1 application  topically 2 (two) times daily as needed (eczema). 09/09/21   Lurline Del, DO      Allergies    Bupropion and Norvasc [amlodipine]    Review of Systems   Review of Systems  Physical Exam Updated Vital Signs BP (!) 182/116   Pulse 87   Temp 98.3 F (36.8 C) (Oral)   Resp 16   Ht '5\' 2"'$  (1.575 m)   Wt 110.2 kg   LMP 04/06/2017   SpO2 95%   BMI 44.45 kg/m  Physical Exam Vitals and nursing note reviewed.  Constitutional:      General: She is not in acute distress.    Appearance: She is well-developed.  HENT:     Head: Normocephalic and atraumatic.     Mouth/Throat:     Mouth: Mucous membranes are moist.  Eyes:     General: Vision grossly intact. Gaze aligned appropriately.     Extraocular Movements: Extraocular movements intact.     Conjunctiva/sclera: Conjunctivae normal.  Cardiovascular:     Rate and Rhythm: Normal rate and regular rhythm.     Pulses: Normal pulses.     Heart sounds: Normal heart sounds, S1 normal and S2 normal. No murmur heard.    No friction rub. No gallop.  Pulmonary:     Effort: Pulmonary effort is normal. No respiratory distress.     Breath sounds: Normal breath  sounds.  Abdominal:     General: Bowel sounds are normal.     Palpations: Abdomen is soft.     Tenderness: There is generalized abdominal tenderness. There is no guarding or rebound.     Hernia: No hernia is present.  Musculoskeletal:        General: No swelling.     Cervical back: Full passive range of motion without pain, normal range of motion and neck supple. No spinous process tenderness or muscular tenderness. Normal range of motion.     Right lower leg: No edema.     Left lower  leg: No edema.  Skin:    General: Skin is warm and dry.     Capillary Refill: Capillary refill takes less than 2 seconds.     Findings: No ecchymosis, erythema, rash or wound.  Neurological:     General: No focal deficit present.     Mental Status: She is alert and oriented to person, place, and time.     GCS: GCS eye subscore is 4. GCS verbal subscore is 5. GCS motor subscore is 6.     Cranial Nerves: Cranial nerves 2-12 are intact.     Sensory: Sensation is intact.     Motor: Motor function is intact.     Coordination: Coordination is intact.  Psychiatric:        Attention and Perception: Attention normal.        Mood and Affect: Mood normal.        Speech: Speech normal.        Behavior: Behavior normal.     ED Results / Procedures / Treatments   Labs (all labs ordered are listed, but only abnormal results are displayed) Labs Reviewed  URINALYSIS, ROUTINE W REFLEX MICROSCOPIC - Abnormal; Notable for the following components:      Result Value   APPearance HAZY (*)    Specific Gravity, Urine 1.035 (*)    Hgb urine dipstick SMALL (*)    Nitrite POSITIVE (*)    Leukocytes,Ua SMALL (*)    Bacteria, UA FEW (*)    All other components within normal limits  CBC WITH DIFFERENTIAL/PLATELET - Abnormal; Notable for the following components:   WBC 16.2 (*)    RBC 5.61 (*)    Hemoglobin 16.0 (*)    HCT 48.6 (*)    Neutro Abs 12.4 (*)    Monocytes Absolute 1.2 (*)    All other components within  normal limits  COMPREHENSIVE METABOLIC PANEL - Abnormal; Notable for the following components:   Potassium 3.4 (*)    Glucose, Bld 116 (*)    AST 14 (*)    All other components within normal limits  URINE CULTURE  LIPASE, BLOOD    EKG None  Radiology CT ABDOMEN PELVIS W CONTRAST  Result Date: 11/07/2022 CLINICAL DATA:  Acute, nonlocalized abdominal pain.  UTI sensation EXAM: CT ABDOMEN AND PELVIS WITH CONTRAST TECHNIQUE: Multidetector CT imaging of the abdomen and pelvis was performed using the standard protocol following bolus administration of intravenous contrast. RADIATION DOSE REDUCTION: This exam was performed according to the departmental dose-optimization program which includes automated exposure control, adjustment of the mA and/or kV according to patient size and/or use of iterative reconstruction technique. CONTRAST:  145m OMNIPAQUE IOHEXOL 300 MG/ML  SOLN COMPARISON:  Abdominal CT 03/17/2022 FINDINGS: Lower chest: No acute finding. Mild ground-glass density clustered in the right lower lobe is stable and attributed to scarring. Hepatobiliary: Two small low densities in the upper liver are stable and likely incidental cysts. No worrisome finding.Cholecystectomy without bile duct dilatation. Pancreas: Unremarkable. Spleen: Unremarkable. Adrenals/Urinary Tract: Bilateral adrenal nodules, adenoma by relative stability and prior noncontrast assessment, up to 2.8 cm on the right. No hydronephrosis or stone. Unremarkable bladder. Stomach/Bowel: Fat inflammation surrounds an area of mild sigmoid colonic wall thickening and diverticular thickening. No pneumoperitoneum or abscess. Vascular/Lymphatic: No acute vascular abnormality. Atheromatous calcification of the aorta and iliacs. No mass or adenopathy. Reproductive:No pathologic findings. Other: Trace peritoneal fluid in the pelvis. Musculoskeletal: No acute abnormalities. Advanced degeneration of the lumbar spine and sacroiliac joints. L4-5  anterolisthesis.  IMPRESSION: 1. Sigmoid diverticulitis without abscess or pneumoperitoneum. 2. Chronic findings are stable from May 2023 and described above. Electronically Signed   By: Jorje Guild M.D.   On: 11/07/2022 05:13    Procedures Procedures    Medications Ordered in ED Medications  cefTRIAXone (ROCEPHIN) 2 g in sodium chloride 0.9 % 100 mL IVPB (2 g Intravenous New Bag/Given 11/07/22 0610)  metroNIDAZOLE (FLAGYL) IVPB 500 mg (500 mg Intravenous New Bag/Given 11/07/22 0610)  morphine (PF) 4 MG/ML injection 4 mg (4 mg Intravenous Given 11/07/22 0348)  ondansetron (ZOFRAN) injection 4 mg (4 mg Intravenous Given 11/07/22 0348)  sodium chloride 0.9 % bolus 500 mL (0 mLs Intravenous Stopped 11/07/22 0537)  sodium chloride (PF) 0.9 % injection (  Given by Other 11/07/22 0445)  iohexol (OMNIPAQUE) 300 MG/ML solution 100 mL (100 mLs Intravenous Contrast Given 11/07/22 0440)    ED Course/ Medical Decision Making/ A&P                             Medical Decision Making Amount and/or Complexity of Data Reviewed Labs: ordered. Radiology: ordered.  Risk Prescription drug management.   Patient presents to the emergency department with complaints of abdominal pain, dysuria, rectal pain.  Patient reports that she thinks she has a UTI.  Symptoms could be assisted with UTI, but with rectal pain and painful bowel movements, diverticulitis was also considered.  Patient does have a mild leukocytosis.  No hypotension or signs of sepsis.  Patient treated with analgesia, IV fluids.  CT scan does show acute diverticulitis which likely is the cause of her symptoms.  Urinalysis will be cultured.  Treated with cephalosporin plus Flagyl which will cover diverticulitis and UTI.        Final Clinical Impression(s) / ED Diagnoses Final diagnoses:  Urinary tract infection without hematuria, site unspecified  Diverticulitis    Rx / DC Orders ED Discharge Orders          Ordered    metroNIDAZOLE  (FLAGYL) 500 MG tablet  3 times daily        11/07/22 0633    cefdinir (OMNICEF) 300 MG capsule  2 times daily        11/07/22 0633    ibuprofen (ADVIL) 800 MG tablet  Every 8 hours PRN        11/07/22 7169              Orpah Greek, MD 11/07/22 [phone]

## 2022-11-07 NOTE — ED Notes (Signed)
Pt aware urine sample is needed, unable to provide one at this time

## 2022-11-09 LAB — URINE CULTURE: Culture: >=100000 — AB

## 2022-11-10 NOTE — Progress Notes (Signed)
ED Antimicrobial Stewardship Positive Culture Follow Up   Hailey Barnes is an 62 y.o. female who presented to [facility] on 11/07/2022 with a chief complaint of  Chief Complaint  Patient presents with   Abdominal Pain   Vaginal Pain    Recent Results (from the past 720 hour(s))  Urine Culture     Status: Abnormal   Collection Time: 11/07/22  5:33 AM   Specimen: Urine, Clean Catch  Result Value Ref Range Status   Specimen Description   Final    URINE, CLEAN CATCH Performed at [facility], [place] [address], [place], [place] [address]    Special Requests   Final    NONE Performed at [facility], [place] [address] [place], [place] [address]    Culture >=100,000 COLONIES/mL KLEBSIELLA PNEUMONIAE (A)  Final   Report Status 11/09/2022 FINAL  Final   Organism ID, Bacteria KLEBSIELLA PNEUMONIAE (A)  Final      Susceptibility   Klebsiella pneumoniae - MIC*    AMPICILLIN >=32 RESISTANT Resistant     CEFAZOLIN <=4 SENSITIVE Sensitive     CEFEPIME <=0.12 SENSITIVE Sensitive     CEFTRIAXONE <=0.25 SENSITIVE Sensitive     CIPROFLOXACIN <=0.25 SENSITIVE Sensitive     GENTAMICIN <=1 SENSITIVE Sensitive     IMIPENEM <=0.25 SENSITIVE Sensitive     NITROFURANTOIN 64 INTERMEDIATE Intermediate     TRIMETH/SULFA <=20 SENSITIVE Sensitive     AMPICILLIN/SULBACTAM 4 SENSITIVE Sensitive     PIP/TAZO <=4 SENSITIVE Sensitive     * >=100,000 COLONIES/mL KLEBSIELLA PNEUMONIAE   Although UCx growing K pneumoniae, UA appears contaminated, and primary clinical concern was for diverticulitis. Per MD, would not pursue UTI treatment unless pt returns with worsening symptoms.  ED Provider: Dene Gentry, MD   [facility], Mitch Arquette A 11/10/2022, 10:49 AM Clinical Pharmacist [phone]

## 2022-11-11 ENCOUNTER — Telehealth (HOSPITAL_BASED_OUTPATIENT_CLINIC_OR_DEPARTMENT_OTHER): Payer: Self-pay | Admitting: Emergency Medicine

## 2022-11-11 NOTE — Telephone Encounter (Signed)
Post ED Visit - Positive Culture Follow-up  Culture report reviewed by antimicrobial stewardship pharmacist: [place] Team '[]'$  Elenor Quinones, Pharm.D. '[]'$  Heide Guile, Pharm.D., BCPS AQ-ID '[]'$  Parks Neptune, Pharm.D., BCPS '[]'$  Alycia Rossetti, Pharm.D., BCPS '[]'$  [place], [place].D., BCPS, AAHIVP '[]'$  Legrand Como, Pharm.D., BCPS, AAHIVP '[]'$  Salome Arnt, PharmD, BCPS '[]'$  Johnnette Gourd, PharmD, BCPS '[]'$  Hughes Better, PharmD, BCPS '[]'$  Leeroy Cha, PharmD '[]'$  Laqueta Linden, PharmD, BCPS '[]'$  Albertina Parr, PharmD  [place] Team '[]'$  Leodis Sias, PharmD '[]'$  Lindell Spar, PharmD '[]'$  Royetta Asal, PharmD '[]'$  Graylin Shiver, Rph '[]'$  Rema Fendt) Glennon Mac, PharmD '[]'$  Arlyn Dunning, PharmD '[]'$  Netta Cedars, PharmD '[]'$  Dia Sitter, PharmD '[]'$  Leone Haven, PharmD '[]'$  Gretta Arab, PharmD '[]'$  Theodis Shove, PharmD '[]'$  Peggyann Juba, PharmD '[x]'$  Reuel Boom, PharmD   Positive urine culture Treated with cefdinir , organism sensitive to the same and no further patient follow-up is required at this time.  Hazle Nordmann 11/11/2022, 12:57 PM

## 2022-11-12 ENCOUNTER — Telehealth: Payer: Self-pay

## 2022-11-12 NOTE — Telephone Encounter (Signed)
     Patient  visit on 11/07/2022  at [facility] was for Urinary tract infection without hematuria, site unspecified.  Have you been able to follow up with your primary care physician?Patient plans to follow up with PCP.  The patient was or was not able to obtain any needed medicine or equipment. Patient obtained medication.  Are there diet recommendations that you are having difficulty following? No. Patient following BRAT diet with no issues.  Patient expresses understanding of discharge instructions and education provided has no other needs at this time.    [place] Resource Care Guide   [email or web address]  ?? [phone]   Website: [email or web address]

## 2022-12-30 ENCOUNTER — Other Ambulatory Visit: Payer: Self-pay | Admitting: Student

## 2022-12-30 DIAGNOSIS — N63 Unspecified lump in unspecified breast: Secondary | ICD-10-CM

## 2023-01-12 ENCOUNTER — Other Ambulatory Visit: Payer: Self-pay

## 2023-01-12 ENCOUNTER — Ambulatory Visit (INDEPENDENT_AMBULATORY_CARE_PROVIDER_SITE_OTHER): Payer: 59 | Admitting: Sports Medicine

## 2023-01-12 VITALS — BP 134/84 | HR 74 | Ht 62.0 in | Wt 231.0 lb

## 2023-01-12 DIAGNOSIS — M1812 Unilateral primary osteoarthritis of first carpometacarpal joint, left hand: Secondary | ICD-10-CM

## 2023-01-12 DIAGNOSIS — G5601 Carpal tunnel syndrome, right upper limb: Secondary | ICD-10-CM

## 2023-01-12 DIAGNOSIS — M79642 Pain in left hand: Secondary | ICD-10-CM

## 2023-01-12 NOTE — Progress Notes (Signed)
Hailey Barnes D.Alton [name] Phone: [phone]   Assessment and Plan:     1. Bilateral hand pain 2. Carpal tunnel syndrome, right 3. Degenerative arthritis of thumb, left  -Chronic with exacerbation, subsequent visit - Patient presents with recurrent bilateral hand pain with right hand most consistent with carpal tunnel and left hand most consistent with CMC arthritis.  Patient had significant relief over the past 5 months since right carpal tunnel CSI and left [facility] CSI performed on 08/13/2022.  She elects for repeat CSI today.  Tolerated well per note below.  CSI may temporarily increase blood leukosis and blood pressure with past medical history DM type II and hypertension  Procedure: Ultrasound Guided Carpal Tunnel Injection Side: Right Diagnosis: Recurrent carpal tunnel [place] Indication:  - accuracy is paramount for diagnosis - to ensure therapeutic efficacy or procedural success - to reduce procedural risk  After explaining the procedure, viable alternatives, risks, and answering any questions, consent was given verbally. The site was cleaned with chlorhexidine prep. An ultrasound transducer was placed on the volar aspect of the wrist.  The median nerve was identified.   An injection was performed under ultrasound guidance from the ulnar approach with care used to avoid ulnar artery.  86ml of 1% lidocaine without epinephrine was used to hydrodissect the median nerve from the retinaculum.  20 mg of triamcinolone (KENALOG) 40mg /ml was then deposited adjacent to but not into the median nerve.  This was well tolerated and resulted in  relief.  Needle was removed and dressing placed and post injection instructions were given including  a discussion of likely return of pain today after the anesthetic wears off (with the possibility of worsened pain) until the steroid starts to work in 1-3 days.   Pt was advised to call or  return to clinic if these symptoms worsen or fail to improve as anticipated.  Procedure: Ultrasound Guided Carpometacarpal Joint Injection Side: Left Diagnosis: Flare of CMC arthritis [place] Indication:  - accuracy is paramount for diagnosis - to ensure therapeutic efficacy or procedural success - to reduce procedural risk  After explaining the procedure, viable alternatives, risks, and answering any questions, consent was given verbally. The site was cleaned with chlorhexidine prep.  An ultrasound transducer was placed on the  [facility] joint.  A steroid injection was performed under ultrasound guidance using 0.42ml of 1% lidocaine without epinephrine and 20 mg of Kenalog 40. This was well tolerated and resulted in  relief.  Needle was removed and dressing placed and post injection instructions were given including  a discussion of likely return of pain today after the anesthetic wears off (with the possibility of worsened pain) until the steroid starts to work in 1-3 days.   Pt was advised to call or return to clinic if these symptoms worsen or fail to improve as anticipated.   Pertinent previous records reviewed include none   Follow Up: As needed if no improvement or worsening of symptoms, or if patient needs repeat injections.   Subjective:   I, [name], am serving as a Education administrator for Doctor Glennon Mac  Chief Complaint: bilat hand pain   HPI:   08/11/2022 Hailey Barnes is a 62 y.o. female who presents to [facility] today for B thumb pain. She was last seen by Dr. [name] on 04/18/22 for B thumb pain and had carpal tunnel injections. Today patient reports the right hand is  the carpal tunnel acting up and in the left thumb it is the joint. Right thumb will wake her up in the middle of the night throbbing, burning, and numb. Feeling that it is time for injections again.   01/12/23 Patient is a 62 year old female complaining of bilat hand pain. Patient  states that right had has carpal tunnel has swelling and numbness and at night is the most pain, left thenar imminence pain   Relevant Historical Information: DM type II, hypertension, COPD  Additional pertinent review of systems negative.   Current Outpatient Medications:    Accu-Chek Softclix Lancets lancets, Use to check blood sugar once daily. E11.9, Disp: 100 each, Rfl: 12   albuterol (VENTOLIN HFA) 108 (90 Base) MCG/ACT inhaler, INHALE 1-2 PUFFS BY MOUTH EVERY 6 HOURS AS NEEDED FOR WHEEZE OR SHORTNESS OF BREATH (Patient taking differently: 2 puffs every 6 (six) hours as needed for wheezing or shortness of breath.), Disp: 18 each, Rfl: 1   allopurinol (ZYLOPRIM) 300 MG tablet, TAKE 1 TABLET BY MOUTH EVERY DAY, Disp: 90 tablet, Rfl: 1   amLODipine (NORVASC) 2.5 MG tablet, TAKE 1 TABLET BY MOUTH AT BEDTIME., Disp: 90 tablet, Rfl: 3   aspirin EC 81 MG tablet, Take 81 mg by mouth daily., Disp: , Rfl:    atorvastatin (LIPITOR) 40 MG tablet, Take 1 tablet (40 mg total) by mouth daily., Disp: 90 tablet, Rfl: 3   BD PEN NEEDLE NANO U/F 32G X 4 MM MISC, USE 3 TIMES A DAY, Disp: 200 each, Rfl: 4   cefdinir (OMNICEF) 300 MG capsule, Take 1 capsule (300 mg total) by mouth 2 (two) times daily., Disp: 20 capsule, Rfl: 0   Cholecalciferol (VITAMIN D) 50 MCG (2000 UT) tablet, Take 4,000 Units by mouth daily., Disp: , Rfl:    conjugated estrogens (PREMARIN) vaginal cream, Place 1 Applicatorful vaginally daily., Disp: 42.5 g, Rfl: 12   glucose blood (ACCU-CHEK AVIVA) test strip, Use to check sugar up to 3 times daily, Disp: 100 each, Rfl: 12   hydrALAZINE (APRESOLINE) 25 MG tablet, Take 2 tablets (50 mg total) by mouth 3 (three) times daily., Disp: , Rfl:    ibuprofen (ADVIL) 200 MG tablet, Take 400 mg by mouth every 6 (six) hours as needed for moderate pain., Disp: , Rfl:    ibuprofen (ADVIL) 800 MG tablet, Take 1 tablet (800 mg total) by mouth every 8 (eight) hours as needed for moderate pain., Disp: 20  tablet, Rfl: 0   lidocaine-prilocaine (EMLA) cream, APPLY 1 APPLICATION TOPICALLY AS NEEDED. FOR UNDER BREAST, Disp: 30 g, Rfl: 2   losartan (COZAAR) 100 MG tablet, TAKE 1 TABLET BY MOUTH EVERY DAY, Disp: 90 tablet, Rfl: 3   Melaton-Thean-Cham-PassF-LBalm (MELATONIN + L-THEANINE) CAPS, Take 2 capsules by mouth at bedtime as needed (sleep)., Disp: , Rfl:    methylPREDNISolone (MEDROL DOSEPAK) 4 MG TBPK tablet, Use as directed, Disp: 21 tablet, Rfl: 0   metroNIDAZOLE (FLAGYL) 500 MG tablet, Take 1 tablet (500 mg total) by mouth 3 (three) times daily., Disp: 30 tablet, Rfl: 0   Multiple Vitamins-Minerals (MULTIVITAMIN GUMMIES ADULT PO), Take 2 capsules by mouth daily., Disp: , Rfl:    ondansetron (ZOFRAN-ODT) 8 MG disintegrating tablet, Take 1 tablet by mouth every 8 (eight) hours as needed., Disp: , Rfl:    OZEMPIC, 0.25 OR 0.5 MG/DOSE, 2 MG/3ML SOPN, INJECT 0.5 MG INTO THE SKIN ONCE A WEEK. 0.25 MG ONCE WEEKLY FOR 4 WEEKS THEN INCREASE TO 0.5 MG WEEKLY FOR  AT LEAST 4 WEEKS,MAX 1 MG, Disp: 3 mL, Rfl: 11   pantoprazole (PROTONIX) 40 MG tablet, ONE TABLET 30 MIN BEFORE BREAKFAST, Disp: 90 tablet, Rfl: 1   pindolol (VISKEN) 10 MG tablet, Take 2 tablets (20 mg total) by mouth 2 (two) times daily., Disp: 360 tablet, Rfl: 3   Polyvinyl Alcohol-Povidone (CLEAR EYES ALL SEASONS OP), Place 1 drop into both eyes daily as needed (allergies/itchy eyes)., Disp: , Rfl:    spironolactone (ALDACTONE) 100 MG tablet, Take 1 tablet (100 mg total) by mouth at bedtime., Disp: 90 tablet, Rfl: 3   torsemide (DEMADEX) 20 MG tablet, Take 1 tablet (20 mg total) by mouth every other day., Disp: , Rfl:    TRELEGY ELLIPTA 200-62.5-25 MCG/ACT AEPB, USE AS DIRECTED 1 PUFF IN THE MOUTH OR THROAT DAILY. TAKE 1 PUFF BY MOUTH EVERY DAY STRENGTH: 200-62.5-25 MCG/ACT, Disp: 60 each, Rfl: 3   triamcinolone (KENALOG) 0.025 % ointment, APPLY 1 APPLICATION TOPICALLY 2 (TWO) TIMES DAILY AS NEEDED. (Patient taking differently: Apply 1  application  topically 2 (two) times daily as needed (eczema).), Disp: 75 g, Rfl: 0   Objective:     Vitals:   01/12/23 1123  BP: 134/84  Pulse: 74  SpO2: 96%  Weight: 231 lb (104.8 kg)  Height: 5\' 2"  (1.575 m)      Body mass index is 42.25 kg/m.    Physical Exam:    General: Appears well, nad, nontoxic and pleasant Neuro:sensation intact, strength is 5/5 with df/pf/inv/ev, muscle tone wnl Skin:no susupicious lesions or rashes  Bilateral hand/wrist:   No deformity or swelling appreciated. Positive Tinel's on right TTP first [facility] and snuffbox  Electronically signed by:  Hailey Barnes D.[facility] 11:58 AM 01/12/23

## 2023-01-12 NOTE — Patient Instructions (Signed)
Good to see you   

## 2023-03-09 ENCOUNTER — Ambulatory Visit (INDEPENDENT_AMBULATORY_CARE_PROVIDER_SITE_OTHER): Payer: 59 | Admitting: Family Medicine

## 2023-03-09 ENCOUNTER — Encounter: Payer: Self-pay | Admitting: Family Medicine

## 2023-03-09 VITALS — BP 126/77 | HR 81 | Ht 62.0 in | Wt 222.4 lb

## 2023-03-09 VITALS — BP 150/102 | HR 85 | Ht 62.0 in | Wt 222.0 lb

## 2023-03-09 DIAGNOSIS — M1812 Unilateral primary osteoarthritis of first carpometacarpal joint, left hand: Secondary | ICD-10-CM

## 2023-03-09 DIAGNOSIS — H6991 Unspecified Eustachian tube disorder, right ear: Secondary | ICD-10-CM

## 2023-03-09 DIAGNOSIS — Z0000 Encounter for general adult medical examination without abnormal findings: Secondary | ICD-10-CM

## 2023-03-09 DIAGNOSIS — I10 Essential (primary) hypertension: Secondary | ICD-10-CM | POA: Diagnosis not present

## 2023-03-09 DIAGNOSIS — G5601 Carpal tunnel syndrome, right upper limb: Secondary | ICD-10-CM | POA: Diagnosis not present

## 2023-03-09 MED ORDER — FLUTICASONE PROPIONATE 50 MCG/ACT NA SUSP: 2.0000 | Freq: Every day | NASAL | 6 refills | Status: DC

## 2023-03-09 MED ORDER — GABAPENTIN 300 MG PO CAPS: 300.0000 mg | ORAL_CAPSULE | Freq: Every day | ORAL | 3 refills | Status: DC

## 2023-03-09 NOTE — Progress Notes (Signed)
   Rubin Payor, PhD, LAT, ATC acting as a scribe for Hailey Graham, MD.    Hailey Barnes is a 62 y.o. female who presents to [facility] today for cont'd bilat hand pain. Pt was last seen by Dr. Jean Rosenthal on 01/12/23 and was given a R carpal tunnel CSI and L CMC steroid injections.  Today, pt reports no relief from prior steroid injections. R across the carpals in the the 1st MCP joint w/ paresthesia in the 1-3 fingers. On the L hand, pain is along the 1st [facility] joint.   Pertinent review of systems: No fevers or chills  Relevant historical information: Carpal tunnel syndrome   Exam:  BP (!) 150/102   Pulse 85   Ht 5\' 2"  (1.575 m)   Wt 222 lb (100.7 kg)   LMP 04/06/2017   SpO2 98%   BMI 40.60 kg/m  General: Well Developed, well nourished, and in no acute distress.   MSK: Right wrist normal appearing Normal hand motion.  Positive Tinel's right carpal tunnel.  Left hand mild swelling around first CMC.  Tender palpation first CMC.     Assessment and Plan: 62 y.o. female with right carpal tunnel syndrome and paresthesias and pain.  Not improving with most recent injection performed by my partner Dr. Jean Rosenthal about 2 months ago.  She does have a nerve conduction study from 2022 that shows severe carpal tunnel right side and moderate left.  At this point if injections are not working as we would like will refer to hand surgery to discuss surgical options for carpal tunnel.  She is eligible for repeat injection in June.  We can schedule a visit then as a backup plan.  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  Certainly could repeat that injection in June if needed.  Prescribe gabapentin for temporary control of nerve pain especially bedtime.   PDMP not reviewed this encounter. Orders Placed This Encounter  Procedures   Ambulatory referral to Orthopedic Surgery    Referral Priority:   Routine    Referral Type:   Surgical    Referral Reason:    Specialty Services Required    Requested Specialty:   Orthopedic Surgery    Number of Visits Requested:   1   Meds ordered this encounter  Medications   gabapentin (NEURONTIN) 300 MG capsule    Sig: Take 1 capsule (300 mg total) by mouth at bedtime.    Dispense:  90 capsule    Refill:  3     Discussed warning signs or symptoms. Please see discharge instructions. Patient expresses understanding.   The above documentation has been reviewed and is accurate and complete Hailey Barnes, M.D.

## 2023-03-09 NOTE — Patient Instructions (Signed)
It was wonderful to see you today. Thank you for allowing me to be a part of your care. Below is a short summary of what we discussed at your visit today:  Blood pressure Your blood pressure today was very high in office. Please return in 2-3 days after taking your medications. We can always adjust these medicines and offer combination pills where possible to make taking the medications easier.   Ear issue Your exam today was reassuring and I did not see or hear evidence of pneumonia, bacterial ear infection, or bacterial sinus infection. No need for antibiotics at this time.   Likely clogged eustachian tube after maybe a viral infection last week or severe allergies.   START using flonase, each nostril, every day at bedtime for at least 7 days in a row.   Reasons to come back: - fever and chills - worsening symptoms despite flonase - cough and shortness of breath  Annual physical  Please book your annual physical on your way out. You are due for several things to keep you healthy, including PAP smear, colonoscopy, and lung cancer screening. You may book this appointment with any doctor here you like.   Please bring all of your medications to every appointment! If you have any questions or concerns, please do not hesitate to contact us via phone or MyChart message.   Fayette Pho, MD

## 2023-03-09 NOTE — Progress Notes (Unsigned)
SUBJECTIVE:   CHIEF COMPLAINT / HPI:   Ear pain, headache One week duration Sinus pain and pressure  Pain over right sinus, ear, neck Lots of nasal congestion, blowing tissues a lot A little nauseous - took meds once No prodomal illnesses No fever, chills, vomiting, constipation, diarrhea, SOB, chest pain  Incidental severe range BP 1st measure, right arm, automatic with blue normal cuff: 216/126, pulse 86 2nd measure, right arm, automatic with red large cuff: 126/77, pulse 81 3rd measure, right arm, automatic with large cuff, up in exam chair: 204/133, pulse 79 4th measure, right arm, large cuff: 180/120  Did not take BP meds this morning  Dx: HTN, HFpEF (65-70%) Prescribed amlodipine 2.5 mg daily (cannot tolerate any more due to leg swelling), hydralazine 50 mg TID, losartan 100 mg daily, spironolactone 100 mg daily, torsemide 20 mg every other day, pindolol 20 mg BID?  Most recent cards note from 07/22/22 says "Continue GDMT with losartan 100 mg, spironolactone 100 mg daily daily and pindolol 10 mg daily".   Patient reports she is -  NOT taking: spironolactone, amlodipine, losartan, pindolol night dose Taking: pindolol 2 tabs in morning only, hydralazine 2 tabs in morning only, torsemide every other day (no BP meds this morning yet)  Reports simply forgets night meds - no adverse side effects noted  BP Readings from Last 3 Encounters:  03/09/23 (!) 150/102  03/09/23 126/77  01/12/23 134/84    PERTINENT  PMH / PSH: COPD, HLD, T2DM, HTN, UTI due to ESBL, peripheral neuropathy, NASH, tobacco use   OBJECTIVE:   BP 126/77   Pulse 81   Ht 5\' 2"  (1.575 m)   Wt 222 lb 6.4 oz (100.9 kg)   LMP 04/06/2017   SpO2 99%   BMI 40.68 kg/m    PHQ-9:     03/09/2023   11:11 AM 10/23/2022   10:29 AM 09/16/2022   11:29 AM  Depression screen PHQ 2/9  Decreased Interest 0 0 0  Down, Depressed, Hopeless 0 0 0  PHQ - 2 Score 0 0 0  Altered sleeping 3  2  Tired, decreased energy  0  1  Change in appetite 3  3  Feeling bad or failure about yourself  0  0  Trouble concentrating 0  0  Moving slowly or fidgety/restless 0  0  Suicidal thoughts 0  0  PHQ-9 Score 6  6  Difficult doing work/chores Somewhat difficult  Somewhat difficult    Physical Exam General: Awake, alert, oriented HEENT: TM pink and flat bilaterally, unremarkable ear canals, nares with clear rhinorrhea, moist oral mucosa, posterior oropharynx nonerythematous with normal appearing tonsils Cardiovascular: Regular rate and rhythm, S1 and S2 present, no murmurs auscultated Respiratory: Lung fields clear to auscultation bilaterally Extremities: No bilateral lower extremity edema Neuro: Cranial nerves II through X grossly intact, able to move all extremities spontaneously, normal gait, EOM intact, midline uvula, normal extremity strength bilaterally upper and lower  ASSESSMENT/PLAN:   Eustachian tube dysfunction, right Suspect eustachian tube dysfunction from virus versus environmental allergic triggers given unilateral pain and pressure in setting of nasal congestion and rhinorrhea.  Physical exam today reassuring, no evidence of bacterial etiology such as bacterial sinusitis, pneumonia, or AOM.  Recommend Flonase each nostril nightly.  Return precautions given, see AVS for more.  Hypertension Uncontrolled today, patient reports she has not taken her blood pressure meds this morning.  Reports poor medication adherence, says she "simply forgets night time meds", no adverse side effects noted.  Prescribed: amlodipine 2.5, hydralazine 50 three times daily, losartan 100, spironolactone 100, torsemide 20 every other day, and pindolol 20 twice daily.    Taking: pindolol 2 tabs in morning only, hydralazine 2 tabs in morning only, torsemide every other day   Physical exam reassuring, although patient does have headache.  Instructed patient to take her medicines as soon as she gets home.  Discussed patient  following up in 2 to 3 days after taking her medicines every day (only the ones she is currently effectively taking - pindolol, hydral, and torsemide).  Will adjust as needed at follow-up.  Unfortunately, her current prescribed medications do not lend themselves very well to commendation pills.  She needs to remain on spironolactone, ACE/ARB, and beta-blocker for GDMT care of HFpEF diagnosis.  Could consider amlodipine-valsartan combo (although patient reports cannot tolerate more than 2.5 mg of amlodipine daily due to leg swelling).  Many ARB combos include HCTZ, however patient already on spironolactone and torsemide.  Would prefer to start higher dose losartan-HCTZ and d/c of torsemide.    Healthcare maintenance Due for numerous healthcare maintenance items including Medicare annual wellness visit, shingles vaccine, ophthalmology exam, A1c, Tdap, Pap smear, urine microalbumin, lung cancer screening, colonoscopy.  Patient instructed to schedule physical exam appointment at her convenience to get these taken care of.   Fayette Pho, MD [facility]

## 2023-03-09 NOTE — Patient Instructions (Addendum)
Thank you for coming in today.   You should hear from Surgery soon.   Schedule with me in about 1 month for possible repeat Carpal Tunnel and left thumb injection.   See what the hand surgeons say.   Ok to try gabapentin at bedtime.

## 2023-03-10 DIAGNOSIS — Z0000 Encounter for general adult medical examination without abnormal findings: Secondary | ICD-10-CM | POA: Insufficient documentation

## 2023-03-10 NOTE — Assessment & Plan Note (Signed)
Due for numerous healthcare maintenance items including Medicare annual wellness visit, shingles vaccine, ophthalmology exam, A1c, Tdap, Pap smear, urine microalbumin, lung cancer screening, colonoscopy.  Patient instructed to schedule physical exam appointment at her convenience to get these taken care of.

## 2023-03-10 NOTE — Assessment & Plan Note (Signed)
Uncontrolled today, patient reports she has not taken her blood pressure meds this morning.  Reports poor medication adherence, says she "simply forgets night time meds", no adverse side effects noted.  Prescribed: amlodipine 2.5, hydralazine 50 three times daily, losartan 100, spironolactone 100, torsemide 20 every other day, and pindolol 20 twice daily.    Taking: pindolol 2 tabs in morning only, hydralazine 2 tabs in morning only, torsemide every other day   Physical exam reassuring, although patient does have headache.  Instructed patient to take her medicines as soon as she gets home.  Discussed patient following up in 2 to 3 days after taking her medicines every day (only the ones she is currently effectively taking - pindolol, hydral, and torsemide).  Will adjust as needed at follow-up.  Unfortunately, her current prescribed medications do not lend themselves very well to commendation pills.  She needs to remain on spironolactone, ACE/ARB, and beta-blocker for GDMT care of HFpEF diagnosis.  Could consider amlodipine-valsartan combo (although patient reports cannot tolerate more than 2.5 mg of amlodipine daily due to leg swelling).  Many ARB combos include HCTZ, however patient already on spironolactone and torsemide.  Would prefer to start higher dose losartan-HCTZ and d/c of torsemide.

## 2023-03-10 NOTE — Assessment & Plan Note (Signed)
Suspect eustachian tube dysfunction from virus versus environmental allergic triggers given unilateral pain and pressure in setting of nasal congestion and rhinorrhea.  Physical exam today reassuring, no evidence of bacterial etiology such as bacterial sinusitis, pneumonia, or AOM.  Recommend Flonase each nostril nightly.  Return precautions given, see AVS for more.

## 2023-03-12 ENCOUNTER — Encounter: Payer: Self-pay | Admitting: Family Medicine

## 2023-03-12 ENCOUNTER — Ambulatory Visit (INDEPENDENT_AMBULATORY_CARE_PROVIDER_SITE_OTHER): Payer: 59 | Admitting: Family Medicine

## 2023-03-12 VITALS — BP 160/90 | HR 81 | Ht 62.0 in | Wt 226.6 lb

## 2023-03-12 DIAGNOSIS — E790 Hyperuricemia without signs of inflammatory arthritis and tophaceous disease: Secondary | ICD-10-CM

## 2023-03-12 DIAGNOSIS — I5032 Chronic diastolic (congestive) heart failure: Secondary | ICD-10-CM | POA: Diagnosis not present

## 2023-03-12 DIAGNOSIS — I10 Essential (primary) hypertension: Secondary | ICD-10-CM | POA: Diagnosis not present

## 2023-03-12 MED ORDER — ALLOPURINOL 300 MG PO TABS: 300.0000 mg | ORAL_TABLET | Freq: Every day | ORAL | 1 refills | Status: DC

## 2023-03-12 NOTE — Progress Notes (Signed)
    SUBJECTIVE:   CHIEF COMPLAINT / HPI:   Hailey Barnes is a 62 y.o. female who presents to the [facility] clinic today to discuss the following concerns:   Hypertension Returns for follow up on BP today. Was last seen in our clinic on 5/20 and had elevated BP to >200 systolic.   Taking Hydralazine 50 mg only in the morning (prescribed 75 mg TID), pindolol 20 mg once daily.  Taking Torsemide 20 mg every other day.  Not taking spironolactone, losartan, amlodipine.   Has HFpEF and follows with cardiology every 6 months. Reports she is nearing her routine follow up, she plans to call them for an appointment. Her last Echo was 10/2021 which showed EF 65-70%, mild LVH, grade 1 DD.   She states she typically just forgets to take her medications. She does have a pill box, but even forgets with that. She denies any chest pain or SOB. Has noticed some ankle swelling at night. She normally sleeps with 2 pillows but in the last 3-4 months she reports now using 3 pillows to sleep. She snores at night, has OSA and wears CPAP about 4 days out the week.   She has lost about 20 lbs since December. She was previously on Ozempic but could not tolerate it. She has reduced her salt intake. Walks for exercise, about 15-20 minutes every other day.   PERTINENT  PMH / PSH: HFpEF, OSA, COPD, T2DM, NASH, HLD   OBJECTIVE:   BP (!) 144/82   Pulse 81   Wt 226 lb 9.6 oz (102.8 kg)   LMP 04/06/2017   SpO2 97%   BMI 41.45 kg/m    General: NAD, pleasant, able to participate in exam Cardiac: RRR, no murmurs. Respiratory: CTAB, normal effort, No wheezes, rales or rhonchi Extremities: no edema or cyanosis. Psych: Normal affect and mood  ASSESSMENT/PLAN:   1. Hyperuricemia Well controlled. Requesting refill on allopurinol.  - allopurinol (ZYLOPRIM) 300 MG tablet; Take 1 tablet (300 mg total) by mouth daily.  Dispense: 90 tablet; Refill: 1  2. Hypertension, unspecified type Patient's blood pressure was  improved today compared to last visit. She continues to be relatively asymptomatic (has HA but that seems chronic for her).  We discussed long-term complications of uncontrolled hypertension including risk of worsening heart failure, MI, stroke, kidney failure.  Patient seems motivated to try to take her medications as prescribed at this point.  She was advised to restart her amlodipine 2.5 mg, 100 mg losartan.  She can also continue 50 mg of hydralazine- but advised to take TID as prescribed, and continue pindolol. Will hold on Spiro at this time given she is still taking Torsemide and seems euvolemic. Also did not want to re-introduce all of her medications at once.  -F/u in 1-2 weeks for BP check, BMP  3. Chronic heart failure with preserved ejection fraction (HCC) Appears euvolemic. Last Echo reassuring. Reports due for cardiology visit soon and will contact office.    Sabino Dick, DO [facility]

## 2023-03-12 NOTE — Patient Instructions (Signed)
It was wonderful to see you today.  Please bring ALL of your medications with you to every visit.   Today we talked about:  Take 50 mg of Hydralazine three times daily Take 100 mg of Losartan once daily Take 20 mg of Pindolol twice daily Take 2.5 mg of Amlodipine once daily  Continue your torsemide, aspirin, atoravastatin. You can HOLD on taking the Spironolactone for now.  Return in 1-2 weeks for blood pressure medicine and blood work.  I have refilled your allopurinol.   Thank you for coming to your visit as scheduled. We have had a large "no-show" problem lately, and this significantly limits our ability to see and care for patients. As a friendly reminder- if you cannot make your appointment please call to cancel. We do have a no show policy for those who do not cancel within 24 hours. Our policy is that if you miss or fail to cancel an appointment within 24 hours, 3 times in a 83-month period, you may be dismissed from our clinic.   Thank you for choosing [facility].   Please call [phone] with any questions about today's appointment.  Please be sure to schedule follow up at the front  desk before you leave today.   Sabino Dick, DO PGY-3 Family Medicine

## 2023-03-18 ENCOUNTER — Telehealth: Payer: Self-pay

## 2023-03-18 NOTE — Telephone Encounter (Signed)
Received call from [place].   She reports patient has an apt tomorrow for mammogram and diagnotic studies.  Order is in PCP box.   This needs to be signed and faxed back before apt tomorrow.

## 2023-03-23 DIAGNOSIS — M13842 Other specified arthritis, left hand: Secondary | ICD-10-CM | POA: Diagnosis not present

## 2023-03-23 DIAGNOSIS — G5603 Carpal tunnel syndrome, bilateral upper limbs: Secondary | ICD-10-CM | POA: Diagnosis not present

## 2023-03-26 ENCOUNTER — Ambulatory Visit: Payer: 59 | Admitting: Family Medicine

## 2023-03-27 ENCOUNTER — Ambulatory Visit: Payer: 59 | Admitting: Family Medicine

## 2023-03-30 ENCOUNTER — Encounter: Payer: Self-pay | Admitting: Family Medicine

## 2023-03-30 ENCOUNTER — Ambulatory Visit (INDEPENDENT_AMBULATORY_CARE_PROVIDER_SITE_OTHER): Payer: 59 | Admitting: Family Medicine

## 2023-03-30 VITALS — BP 143/91 | HR 81 | Temp 98.4°F | Ht 62.0 in | Wt 232.6 lb

## 2023-03-30 DIAGNOSIS — N6489 Other specified disorders of breast: Secondary | ICD-10-CM | POA: Diagnosis not present

## 2023-03-30 DIAGNOSIS — I10 Essential (primary) hypertension: Secondary | ICD-10-CM | POA: Diagnosis not present

## 2023-03-30 NOTE — Progress Notes (Unsigned)
    SUBJECTIVE:   CHIEF COMPLAINT / HPI:   HTN f/u Last seen on 03/12/23 (~2.5 weeks ago). At that time she was not taking several of her medications (spironolactone, losartan, and amlodipine) and was only taking hydralazine 50 mg once daily. Was advised to restart her losartan and amlodipine and increase the hydral to TID. Held off on restarting spiro as they did not want to re-introduce too many medications at once.  Returns today for follow-up. Reports she has restarted all of her medications (including spiro). Current BP meds that she's taking:  Hydralazine 50mg  TID Pindolol 20mg  BID Losartan 100mg  daily Amlodipine 2.5mg  daily Spironolactone (she's unsure of exact dose) daily Torsemide 20mg  every other day  In the past week has forgotten mid day meds twice, otherwise denies missed doses. Does not check BP at home. Plans to get home BP cuff next month once her insurance discount card goes back in effect  PERTINENT  PMH / PSH: HFpEF, OSA on CPAP, T2DM, tobacco use disorder  OBJECTIVE:   BP (!) 143/91   Pulse 81   Temp 98.4 F (36.9 C)   Ht 5\' 2"  (1.575 m)   Wt 232 lb 9.6 oz (105.5 kg)   LMP 04/06/2017   SpO2 98%   BMI 42.54 kg/m   Gen: NAD, pleasant, able to participate in exam CV: RRR, normal S1/S2, no murmur Resp: Normal effort, lungs CTAB Extremities: no edema or cyanosis Skin: warm and dry, no rashes noted Neuro: alert, no obvious focal deficits Psych: Normal affect and mood  ASSESSMENT/PLAN:   Hypertension Improved control although BP still slightly above goal. Encouraged ongoing medication adherence as this has been an issue in the past. Will not make any adjustments today. Check updated BMP.  In the future, would benefit from combination therapy to reduce pill burden but unfortunately her medications don't lend themselves to this. To start could consider changing losartan to valsartan in order to combine with amlodipine. Could refer to Dr Raymondo Band as well. Will  defer to PCP on this.   Follow up in 2 weeks for annual physical.  Maury Dus, MD [facility]

## 2023-03-30 NOTE — Patient Instructions (Addendum)
It was great to meet you!  Things we discussed at today's visit: -Continue taking all of your medications. See end of this packet for updated list. -We are checking some labs. We will send you a MyChart message with the results or call if they are abnormal.  -Schedule an appointment for an annual physical/diabetes visit on your way out today -Schedule a routine follow-up visit with your cardiologist  Take care and seek immediate care sooner if you develop any concerns.  Dr. [facility]

## 2023-03-31 LAB — BASIC METABOLIC PANEL
BUN/Creatinine Ratio: 16 (ref 12–28)
BUN: 14 mg/dL (ref 8–27)
CO2: 27 mmol/L (ref 20–29)
Calcium: 9.2 mg/dL (ref 8.7–10.3)
Chloride: 105 mmol/L (ref 96–106)
Creatinine, Ser: 0.88 mg/dL (ref 0.57–1.00)
Glucose: 87 mg/dL (ref 70–99)
Potassium: 4.7 mmol/L (ref 3.5–5.2)
Sodium: 143 mmol/L (ref 134–144)
eGFR: 75 mL/min/{1.73_m2} (ref >59)

## 2023-03-31 NOTE — Assessment & Plan Note (Addendum)
Improved control although BP still slightly above goal. Encouraged ongoing medication adherence as this has been an issue in the past. Will not make any adjustments today. Check updated BMP.  In the future, would benefit from combination therapy to reduce pill burden but unfortunately her medications don't lend themselves to this. To start could consider changing losartan to valsartan in order to combine with amlodipine. Could refer to Dr Raymondo Band as well. Will defer to PCP on this.

## 2023-04-04 ENCOUNTER — Other Ambulatory Visit: Payer: Self-pay | Admitting: Student

## 2023-04-04 DIAGNOSIS — J449 Chronic obstructive pulmonary disease, unspecified: Secondary | ICD-10-CM

## 2023-04-06 DIAGNOSIS — G5601 Carpal tunnel syndrome, right upper limb: Secondary | ICD-10-CM | POA: Diagnosis not present

## 2023-04-09 ENCOUNTER — Other Ambulatory Visit: Payer: Self-pay

## 2023-04-09 NOTE — Progress Notes (Deleted)
   Evajean Peniston December 12, 1960 [id number]  Patient outreached by Minette Brine , PharmD Candidate on 04/09/2023.  Blood Pressure Readings: Does the patient have a validated home blood pressure machine?: No   Medication review was performed. Is the patient taking their medications as prescribed?: Yes Differences from their prescribed list include: Taking some medications PRN  The following barriers to adherence were noted: Does the patient have cost concerns?: No Does the patient have transportation concerns?: Yes Does the patient need assistance obtaining refills?: No Does the patient occassionally forget to take some of their prescribed medications?: Yes (Occasionally forgets) Does the patient feel like one/some of their medications make them feel poorly?: No Does the patient have questions or concerns about their medications?: No Does the patient have a follow up scheduled with their primary care provider/cardiologist?: Yes   Interventions: Interventions Completed: Medications were reviewed, Patient was educated on goal blood pressures and long term health implications of elevated blood pressure, Patient was educated on how to access home blood pressure machine, Patient was educated on use of adherence strategies, like a pill box or alarms  The patient has follow up scheduled:  PCP: Jerre Simon, MD   Minette Brine, Student-PharmD

## 2023-04-09 NOTE — Progress Notes (Signed)
   Hailey Barnes 1960-11-04 [id number]  Patient outreached by Minette Brine , PharmD Candidate on 04/09/2023.  Blood Pressure Readings: Last documented ambulatory systolic blood pressure: 143 Last documented ambulatory diastolic blood pressure: 91 Does the patient have a validated home blood pressure machine?: No   Medication review was performed. Is the patient taking their medications as prescribed?: Yes Differences from their prescribed list include: Pt taking some medications PRN  The following barriers to adherence were noted: Does the patient have cost concerns?: No Does the patient have transportation concerns?: Yes Does the patient need assistance obtaining refills?: No Does the patient occassionally forget to take some of their prescribed medications?: Yes (Occasionally forgets) Does the patient feel like one/some of their medications make them feel poorly?: No Does the patient have questions or concerns about their medications?: No Does the patient have a follow up scheduled with their primary care provider/cardiologist?: Yes   Interventions: Interventions Completed: Medications were reviewed, Patient was educated on goal blood pressures and long term health implications of elevated blood pressure, Patient was educated on how to access home blood pressure machine, Patient was educated on use of adherence strategies, like a pill box or alarms  The patient has follow up scheduled:  PCP: Jerre Simon, MD   Minette Brine, Student-PharmD

## 2023-04-10 ENCOUNTER — Other Ambulatory Visit: Payer: Self-pay

## 2023-04-10 ENCOUNTER — Encounter: Payer: Self-pay | Admitting: Student

## 2023-04-10 ENCOUNTER — Ambulatory Visit (INDEPENDENT_AMBULATORY_CARE_PROVIDER_SITE_OTHER): Payer: 59 | Admitting: Student

## 2023-04-10 VITALS — BP 157/99 | HR 78 | Ht 62.0 in | Wt 229.0 lb

## 2023-04-10 DIAGNOSIS — E1169 Type 2 diabetes mellitus with other specified complication: Secondary | ICD-10-CM

## 2023-04-10 DIAGNOSIS — F17200 Nicotine dependence, unspecified, uncomplicated: Secondary | ICD-10-CM

## 2023-04-10 DIAGNOSIS — Z122 Encounter for screening for malignant neoplasm of respiratory organs: Secondary | ICD-10-CM

## 2023-04-10 DIAGNOSIS — Z1211 Encounter for screening for malignant neoplasm of colon: Secondary | ICD-10-CM

## 2023-04-10 DIAGNOSIS — I10 Essential (primary) hypertension: Secondary | ICD-10-CM

## 2023-04-10 LAB — POCT GLYCOSYLATED HEMOGLOBIN (HGB A1C): HbA1c, POC (prediabetic range): 5.7 % (ref 5.7–6.4)

## 2023-04-10 NOTE — Assessment & Plan Note (Signed)
Well-controlled with stable A1c at 5.7%.  No reports of hypoglycemia and hyperglycemia episodes. -Labs for A1c, lipid panel and microalbuminuria -Patient to contact ophthalmologist for recent eye exam -Discussed importance of diet and exercise and.

## 2023-04-10 NOTE — Patient Instructions (Signed)
It was wonderful to see you today. Thank you for allowing me to be a part of your care. Below is a short summary of what we discussed at your visit today:  Blood pressure today is elevated.  Please make sure you are taking your medications as prescribed.  Please keep a daily blood pressure diary and follow-up in a month to reassess.  Placed order for chest CT given your smoking history to screen for lung cancer.  I have also sent in referral to gastroenterologist for colonoscopy which is due.  Please make sure your ophthalmologist send [place] the results from your eye exam.  Today we are obtaining labs to check your cholesterol level and kidney function.  Your A1c today was 5.7%   When you are ready you can schedule an appointment with our pharmacy team to discuss ways to help with smoking cessation.  If you have any questions or concerns, please do not hesitate to contact us via phone or MyChart message.   Jerre Simon, MD [facility]

## 2023-04-10 NOTE — Progress Notes (Signed)
Annual Wellness Visit     Patient: Hailey Barnes, Female    DOB: June 07, 1961, 62 y.o.   MRN: [id number]  Subjective  Chief Complaint  Patient presents with   Annual Exam    No concerns    Diabetes    No concerns     Hailey Barnes is a 62 y.o. female who presents today for her Annual Wellness Visit.   Diet: Decreased appetite but eating more fruits Sleep: Gets 4hrs, difficulty staying asleep  Exercise: 30 mins walk 2 times a week  Alcohol use:  Occasionally beer  Tobacco use: 40+ yrs and a pack a day Illicit drug use: Marijuana  Sexually active: Yes, 1 partner  Works as: No Lives with: Alone Code Status:Full Code Power of Attorney: Doctor, hospital ( Daughter)   Medical concerns: None but recently undergo carpel tunnel surgery on her right wrist.  Medications: Outpatient Medications Prior to Visit  Medication Sig   Accu-Chek Softclix Lancets lancets Use to check blood sugar once daily. E11.9 (Patient not taking: Reported on 04/09/2023)   albuterol (VENTOLIN HFA) 108 (90 Base) MCG/ACT inhaler INHALE 1-2 PUFFS BY MOUTH EVERY 6 HOURS AS NEEDED FOR WHEEZE OR SHORTNESS OF BREATH (Patient taking differently: 2 puffs every 6 (six) hours as needed for wheezing or shortness of breath.)   allopurinol (ZYLOPRIM) 300 MG tablet Take 1 tablet (300 mg total) by mouth daily.   amLODipine (NORVASC) 2.5 MG tablet TAKE 1 TABLET BY MOUTH AT BEDTIME.   aspirin EC 81 MG tablet Take 81 mg by mouth daily.   atorvastatin (LIPITOR) 40 MG tablet Take 1 tablet (40 mg total) by mouth daily.   BD PEN NEEDLE NANO U/F 32G X 4 MM MISC USE 3 TIMES A DAY (Patient not taking: Reported on 04/09/2023)   Cholecalciferol (VITAMIN D) 50 MCG (2000 UT) tablet Take 4,000 Units by mouth daily.   conjugated estrogens (PREMARIN) vaginal cream Place 1 Applicatorful vaginally daily. (Patient not taking: Reported on 04/09/2023)   fluticasone (FLONASE) 50 MCG/ACT nasal spray Place 2 sprays into both nostrils daily.    gabapentin (NEURONTIN) 300 MG capsule Take 1 capsule (300 mg total) by mouth at bedtime.   glucose blood (ACCU-CHEK AVIVA) test strip Use to check sugar up to 3 times daily (Patient not taking: Reported on 04/09/2023)   hydrALAZINE (APRESOLINE) 25 MG tablet Take 2 tablets (50 mg total) by mouth 3 (three) times daily.   ibuprofen (ADVIL) 800 MG tablet Take 1 tablet (800 mg total) by mouth every 8 (eight) hours as needed for moderate pain.   lidocaine-prilocaine (EMLA) cream APPLY 1 APPLICATION TOPICALLY AS NEEDED. FOR UNDER BREAST   losartan (COZAAR) 100 MG tablet TAKE 1 TABLET BY MOUTH EVERY DAY   Melaton-Thean-Cham-PassF-LBalm (MELATONIN + L-THEANINE) CAPS Take 2 capsules by mouth at bedtime as needed (sleep).   Multiple Vitamins-Minerals (MULTIVITAMIN GUMMIES ADULT PO) Take 2 capsules by mouth daily.   ondansetron (ZOFRAN-ODT) 8 MG disintegrating tablet Take 1 tablet by mouth every 8 (eight) hours as needed.   pantoprazole (PROTONIX) 40 MG tablet ONE TABLET 30 MIN BEFORE BREAKFAST (Patient not taking: Reported on 04/09/2023)   pindolol (VISKEN) 10 MG tablet Take 2 tablets (20 mg total) by mouth 2 (two) times daily.   Polyvinyl Alcohol-Povidone (CLEAR EYES ALL SEASONS OP) Place 1 drop into both eyes daily as needed (allergies/itchy eyes).   spironolactone (ALDACTONE) 100 MG tablet Take 1 tablet (100 mg total) by mouth at bedtime.   torsemide (DEMADEX) 20 MG  tablet Take 1 tablet (20 mg total) by mouth every other day.   TRELEGY ELLIPTA 200-62.5-25 MCG/ACT AEPB USE AS DIRECTED 1 PUFF IN THE MOUTH OR THROAT DAILY. TAKE 1 PUFF BY MOUTH EVERY DAY   triamcinolone (KENALOG) 0.025 % ointment APPLY 1 APPLICATION TOPICALLY 2 (TWO) TIMES DAILY AS NEEDED. (Patient taking differently: Apply 1 application  topically 2 (two) times daily as needed (eczema).)   No facility-administered medications prior to visit.    Allergies  Allergen Reactions   Bupropion Other (See Comments)    Throat soreness and  difficulty swallowing.    Norvasc [Amlodipine] Swelling    Significant leg swelling with 10mg . Able to tolerate 2.5mg .     Patient Care Team: Jerre Simon, MD as PCP - General (Family Medicine) Jake Bathe, MD as PCP - Cardiology (Cardiology) Linna Darner, RD as Dietitian (Family Medicine) Jodi Geralds, MD as Consulting Physician (Orthopedic Surgery) Monica Becton, MD as Consulting Physician (Family Medicine) Armbruster, Willaim Rayas, MD as Consulting Physician (Gastroenterology) Juanell Fairly, RN as [facility] Care Management Juanell Fairly, RN as Case Manager   Objective  BP (!) 157/99   Pulse 78   Ht 5\' 2"  (1.575 m)   Wt 229 lb (103.9 kg)   LMP 04/06/2017   SpO2 97%   BMI 41.88 kg/m   General: Alert, well appearing, NAD HEENT: Atraumatic, MMM, No sclera icterus CV: RRR, no murmurs, normal S1/S2 Pulm: CTAB, good WOB on RA, no crackles or wheezing Abd: Soft, no distension, no tenderness Skin: dry, warm Ext: No BLE edema, +2 Pedal and radial pulse. Right wrist is wrapped in bandage  Psych: Pleasant affect, appropriate mood and response    Most recent fall risk assessment:    03/12/2023   10:22 AM  Fall Risk   Falls in the past year? 0    Most recent depression screenings:    03/12/2023   10:33 AM 03/09/2023   11:11 AM  PHQ 2/9 Scores  PHQ - 2 Score 0 0  PHQ- 9 Score 7 6    Results for orders placed or performed in visit on 04/10/23  HgB A1c  Result Value Ref Range   Hemoglobin A1C     HbA1c POC (<> result, manual entry)     HbA1c, POC (prediabetic range) 5.7 5.7 - 6.4 %   HbA1c, POC (controlled diabetic range)        Assessment & Plan   Annual wellness visit done today including the all of the following: Reviewed patient's Family Medical History Reviewed and updated list of patient's medical providers Assessment of cognitive impairment was done Assessed patient's functional ability Established a written schedule for health  screening services Health Risk Assessent Completed and Reviewed  Exercise Activities and Dietary recommendations  Goals      Blood Pressure < 140/90     Quit Smoking     Weight (lb) < 200 lb (90.7 kg)     Wants to lose weight to get knee replacement surgery; has been referred to [facility].        Immunization History  Administered Date(s) Administered   Influenza,inj,Quad PF,6+ Mos 06/23/2014, 12/07/2015, 08/07/2016, 09/21/2017, 09/07/2018, 08/19/2021   Influenza-Unspecified 07/11/2020   Moderna Sars-Covid-2 Vaccination 03/23/2020, 04/20/2020   PNEUMOCOCCAL CONJUGATE-20 10/23/2021   Pneumococcal Polysaccharide-23 03/31/2014    Health Maintenance  Topic Date Due   DTaP/Tdap/Td (1 - Tdap) Never done   Zoster Vaccines- Shingrix (1 of 2) Never done   [employer] (AWV)  09/08/2019   OPHTHALMOLOGY EXAM  03/07/2020   Diabetic kidney evaluation - Urine ACR  02/16/2021   Colonoscopy  04/28/2022   COVID-19 Vaccine (3 - 2023-24 season) 06/20/2022   PAP SMEAR-Modifier  08/29/2022   Lung Cancer Screening  09/10/2022   INFLUENZA VACCINE  05/21/2023   FOOT EXAM  05/22/2023   MAMMOGRAM  10/01/2023   HEMOGLOBIN A1C  10/10/2023   Diabetic kidney evaluation - eGFR measurement  03/29/2024   Hepatitis C Screening  Completed   HIV Screening  Completed   HPV VACCINES  Aged Out     Discussed health benefits of physical activity, and encouraged her to engage in regular exercise appropriate for her age and condition.    Problem List Items Addressed This Visit       Cardiovascular and Mediastinum   Hypertension (Chronic)    Blood pressure today is elevated x 2.  She endorses compliance to medication including Spironolactone, losartan and amlodipine. Doesn't check BP at home. Currently asymptomatic and most recent BMP showed normal level creatinine. -Ordered microalbuminuria -Advised daily ambulatory blood pressure checks and keeping blood pressure diary -Patient to  follow-up in a months to reassess home BP reading.        Endocrine   T2DM (type 2 diabetes mellitus) (HCC) - Primary (Chronic)    Well-controlled with stable A1c at 5.7%.  No reports of hypoglycemia and hyperglycemia episodes. -Labs for A1c, lipid panel and microalbuminuria -Patient to contact ophthalmologist for recent eye exam -Discussed importance of diet and exercise and.      Relevant Orders   HgB A1c (Completed)   Lipid Panel   Microalbumin/Creatinine Ratio, Urine     Other   Tobacco use disorder    History of tobacco use for over 40 years.  Denies worsening shortness of breath or chest pain.Contemplating quitting at this time.  Exam has good work of breathing on room air with reassuring O2 sats. -Ordered low-dose chest CT -Discussed smoking cessation -Patient open to seeing Dr. Raymondo Band when she's ready to quit smoking      Other Visit Diagnoses     Colon cancer screening       Relevant Orders   Ambulatory referral to Gastroenterology   Encounter for screening for lung cancer       Relevant Orders   CT CHEST LUNG CA SCREEN LOW DOSE W/O CM      Health maintenance Patient is due for Pap smear.  Via shared decision will return in the months to complete Pap smear.  Follow up in a month to evaluate blood pressure and complete Pap smear.  Jerre Simon, MD

## 2023-04-10 NOTE — Assessment & Plan Note (Signed)
History of tobacco use for over 40 years.  Denies worsening shortness of breath or chest pain.Contemplating quitting at this time.  Exam has good work of breathing on room air with reassuring O2 sats. -Ordered low-dose chest CT -Discussed smoking cessation -Patient open to seeing Dr. Raymondo Band when she's ready to quit smoking

## 2023-04-10 NOTE — Assessment & Plan Note (Signed)
Blood pressure today is elevated x 2.  She endorses compliance to medication including Spironolactone, losartan and amlodipine. Doesn't check BP at home. Currently asymptomatic and most recent BMP showed normal level creatinine. -Ordered microalbuminuria -Advised daily ambulatory blood pressure checks and keeping blood pressure diary -Patient to follow-up in a months to reassess home BP reading.

## 2023-04-11 LAB — MICROALBUMIN / CREATININE URINE RATIO
Creatinine, Urine: 150.1 mg/dL
Microalb/Creat Ratio: 5 mg/g creat (ref 0–29)
Microalbumin, Urine: 7.3 ug/mL

## 2023-04-11 LAB — LIPID PANEL
Chol/HDL Ratio: 2.8 ratio (ref 0.0–4.4)
Cholesterol, Total: 159 mg/dL (ref 100–199)
HDL: 56 mg/dL (ref >39)
LDL Chol Calc (NIH): 82 mg/dL (ref 0–99)
Triglycerides: 118 mg/dL (ref 0–149)
VLDL Cholesterol Cal: 21 mg/dL (ref 5–40)

## 2023-04-20 DIAGNOSIS — M79641 Pain in right hand: Secondary | ICD-10-CM | POA: Diagnosis not present

## 2023-04-23 NOTE — Patient Instructions (Addendum)
Ms. Teffeteller , Thank you for taking time to come for your Medicare Wellness Visit. I appreciate your ongoing commitment to your health goals. Please review the following plan we discussed and let me know if I can assist you in the future.   These are the goals we discussed:  Goals      Blood Pressure < 140/90     Increase physical activity     Quit Smoking     Weight (lb) < 200 lb (90.7 kg)     Wants to lose weight to get knee replacement surgery; has been referred to [facility].        This is a list of the screening recommended for you and due dates:  Health Maintenance  Topic Date Due   Eye exam for diabetics  03/07/2020   COVID-19 Vaccine (3 - 2023-24 season) 06/20/2022   Pap Smear  08/29/2022   Screening for Lung Cancer  09/10/2022   Zoster (Shingles) Vaccine (1 of 2) 07/25/2023*   Flu Shot  05/21/2023   Complete foot exam   05/22/2023   Mammogram  10/01/2023   Hemoglobin A1C  10/10/2023   Yearly kidney function blood test for diabetes  03/29/2024   Yearly kidney health urinalysis for diabetes  04/09/2024   Medicare Annual Wellness Visit  04/23/2024   Colon Cancer Screening  04/28/2024   Hepatitis C Screening  Completed   HIV Screening  Completed   HPV Vaccine  Aged Out   DTaP/Tdap/Td vaccine  Discontinued  *Topic was postponed. The date shown is not the original due date.    Advanced directives: Information on Advanced Care Planning can be found at [facility] Advance Health Care Directives Advance Health Care Directives ([email or web address] Please bring a copy of your health care power of attorney and living will to the office to be added to your chart at your convenience.  Conditions/risks identified: Aim for 30 minutes of exercise or brisk walking, 6-8 glasses of water, and 5 servings of fruits and vegetables each day.  Next appointment: Follow up in one year for your annual wellness visit.   Preventive Care 40-64 Years, Female Preventive care refers to  lifestyle choices and visits with your health care provider that can promote health and wellness. What does preventive care include? A yearly physical exam. This is also called an annual well check. Dental exams once or twice a year. Routine eye exams. Ask your health care provider how often you should have your eyes checked. Personal lifestyle choices, including: Daily care of your teeth and gums. Regular physical activity. Eating a healthy diet. Avoiding tobacco and drug use. Limiting alcohol use. Practicing safe sex. Taking low-dose aspirin daily starting at age 78. Taking vitamin and mineral supplements as recommended by your health care provider. What happens during an annual well check? The services and screenings done by your health care provider during your annual well check will depend on your age, overall health, lifestyle risk factors, and family history of disease. Counseling  Your health care provider may ask you questions about your: Alcohol use. Tobacco use. Drug use. Emotional well-being. Home and relationship well-being. Sexual activity. Eating habits. Work and work Astronomer. Method of birth control. Menstrual cycle. Pregnancy history. Screening  You may have the following tests or measurements: Height, weight, and BMI. Blood pressure. Lipid and cholesterol levels. These may be checked every 5 years, or more frequently if you are over 72 years old. Skin check. Lung cancer screening. You may  have this screening every year starting at age 12 if you have a 30-pack-year history of smoking and currently smoke or have quit within the past 15 years. Fecal occult blood test (FOBT) of the stool. You may have this test every year starting at age 27. Flexible sigmoidoscopy or colonoscopy. You may have a sigmoidoscopy every 5 years or a colonoscopy every 10 years starting at age 87. Hepatitis C blood test. Hepatitis B blood test. Sexually transmitted disease (STD)  testing. Diabetes screening. This is done by checking your blood sugar (glucose) after you have not eaten for a while (fasting). You may have this done every 1-3 years. Mammogram. This may be done every 1-2 years. Talk to your health care provider about when you should start having regular mammograms. This may depend on whether you have a family history of breast cancer. BRCA-related cancer screening. This may be done if you have a family history of breast, ovarian, tubal, or peritoneal cancers. Pelvic exam and Pap test. This may be done every 3 years starting at age 37. Starting at age 70, this may be done every 5 years if you have a Pap test in combination with an HPV test. Bone density scan. This is done to screen for osteoporosis. You may have this scan if you are at high risk for osteoporosis. Discuss your test results, treatment options, and if necessary, the need for more tests with your health care provider. Vaccines  Your health care provider may recommend certain vaccines, such as: Influenza vaccine. This is recommended every year. Tetanus, diphtheria, and acellular pertussis (Tdap, Td) vaccine. You may need a Td booster every 10 years. Zoster vaccine. You may need this after age 58. Pneumococcal 13-valent conjugate (PCV13) vaccine. You may need this if you have certain conditions and were not previously vaccinated. Pneumococcal polysaccharide (PPSV23) vaccine. You may need one or two doses if you smoke cigarettes or if you have certain conditions. Talk to your health care provider about which screenings and vaccines you need and how often you need them. This information is not intended to replace advice given to you by your health care provider. Make sure you discuss any questions you have with your health care provider. Document Released: 11/02/2015 Document Revised: 06/25/2016 Document Reviewed: 08/07/2015 Elsevier Interactive Patient Education  2017 [employer].    Fall  Prevention in the Home Falls can cause injuries. They can happen to people of all ages. There are many things you can do to make your home safe and to help prevent falls. What can I do on the outside of my home? Regularly fix the edges of walkways and driveways and fix any cracks. Remove anything that might make you trip as you walk through a door, such as a raised step or threshold. Trim any bushes or trees on the path to your home. Use bright outdoor lighting. Clear any walking paths of anything that might make someone trip, such as rocks or tools. Regularly check to see if handrails are loose or broken. Make sure that both sides of any steps have handrails. Any raised decks and porches should have guardrails on the edges. Have any leaves, snow, or ice cleared regularly. Use sand or salt on walking paths during winter. Clean up any spills in your garage right away. This includes oil or grease spills. What can I do in the bathroom? Use night lights. Install grab bars by the toilet and in the tub and shower. Do not use towel bars as grab  bars. Use non-skid mats or decals in the tub or shower. If you need to sit down in the shower, use a plastic, non-slip stool. Keep the floor dry. Clean up any water that spills on the floor as soon as it happens. Remove soap buildup in the tub or shower regularly. Attach bath mats securely with double-sided non-slip rug tape. Do not have throw rugs and other things on the floor that can make you trip. What can I do in the bedroom? Use night lights. Make sure that you have a light by your bed that is easy to reach. Do not use any sheets or blankets that are too big for your bed. They should not hang down onto the floor. Have a firm chair that has side arms. You can use this for support while you get dressed. Do not have throw rugs and other things on the floor that can make you trip. What can I do in the kitchen? Clean up any spills right away. Avoid  walking on wet floors. Keep items that you use a lot in easy-to-reach places. If you need to reach something above you, use a strong step stool that has a grab bar. Keep electrical cords out of the way. Do not use floor polish or wax that makes floors slippery. If you must use wax, use non-skid floor wax. Do not have throw rugs and other things on the floor that can make you trip. What can I do with my stairs? Do not leave any items on the stairs. Make sure that there are handrails on both sides of the stairs and use them. Fix handrails that are broken or loose. Make sure that handrails are as long as the stairways. Check any carpeting to make sure that it is firmly attached to the stairs. Fix any carpet that is loose or worn. Avoid having throw rugs at the top or bottom of the stairs. If you do have throw rugs, attach them to the floor with carpet tape. Make sure that you have a light switch at the top of the stairs and the bottom of the stairs. If you do not have them, ask someone to add them for you. What else can I do to help prevent falls? Wear shoes that: Do not have high heels. Have rubber bottoms. Are comfortable and fit you well. Are closed at the toe. Do not wear sandals. If you use a stepladder: Make sure that it is fully opened. Do not climb a closed stepladder. Make sure that both sides of the stepladder are locked into place. Ask someone to hold it for you, if possible. Clearly mark and make sure that you can see: Any grab bars or handrails. First and last steps. Where the edge of each step is. Use tools that help you move around (mobility aids) if they are needed. These include: Canes. Walkers. Scooters. Crutches. Turn on the lights when you go into a dark area. Replace any light bulbs as soon as they burn out. Set up your furniture so you have a clear path. Avoid moving your furniture around. If any of your floors are uneven, fix them. If there are any pets around  you, be aware of where they are. Review your medicines with your doctor. Some medicines can make you feel dizzy. This can increase your chance of falling. Ask your doctor what other things that you can do to help prevent falls. This information is not intended to replace advice given to you by your  health care provider. Make sure you discuss any questions you have with your health care provider. Document Released: 08/02/2009 Document Revised: 03/13/2016 Document Reviewed: 11/10/2014 Elsevier Interactive Patient Education  2017 [employer].

## 2023-04-23 NOTE — Progress Notes (Signed)
Subjective:   Hailey Barnes is a 62 y.o. female who presents for Medicare Annual (Subsequent) preventive examination.  Visit Complete: Virtual  I connected with  Hailey Barnes on 04/24/23 by a audio enabled telemedicine application and verified that I am speaking with the correct person using two identifiers.  Patient Location: Home  Provider Location: Home Office  I discussed the limitations of evaluation and management by telemedicine. The patient expressed understanding and agreed to proceed.  Review of Systems     Cardiac Risk Factors include: diabetes mellitus;dyslipidemia;hypertension;sedentary lifestyle;smoking/ tobacco exposure     Objective:    Today's Vitals   04/24/23 0838  Weight: 229 lb (103.9 kg)  Height: 5\' 2"  (1.575 m)   Body mass index is 41.88 kg/m.     04/24/2023    9:45 AM 03/12/2023   10:22 AM 03/09/2023   11:09 AM 11/07/2022    3:15 AM 08/28/2022   11:09 AM 06/16/2022    3:40 PM 05/29/2022   10:39 AM  Advanced Directives  Does Patient Have a Medical Advance Directive? No No No No No No No  Would patient like information on creating a medical advance directive? Yes (MAU/Ambulatory/Procedural Areas - Information given) No - Patient declined  No - Patient declined No - Patient declined No - Patient declined No - Patient declined    Current Medications (verified) Outpatient Encounter Medications as of 04/24/2023  Medication Sig   albuterol (VENTOLIN HFA) 108 (90 Base) MCG/ACT inhaler INHALE 1-2 PUFFS BY MOUTH EVERY 6 HOURS AS NEEDED FOR WHEEZE OR SHORTNESS OF BREATH (Patient taking differently: 2 puffs every 6 (six) hours as needed for wheezing or shortness of breath.)   allopurinol (ZYLOPRIM) 300 MG tablet Take 1 tablet (300 mg total) by mouth daily.   amLODipine (NORVASC) 2.5 MG tablet TAKE 1 TABLET BY MOUTH AT BEDTIME.   aspirin EC 81 MG tablet Take 81 mg by mouth daily.   atorvastatin (LIPITOR) 40 MG tablet Take 1 tablet (40 mg total) by  mouth daily.   Cholecalciferol (VITAMIN D) 50 MCG (2000 UT) tablet Take 4,000 Units by mouth daily.   fluticasone (FLONASE) 50 MCG/ACT nasal spray Place 2 sprays into both nostrils daily.   gabapentin (NEURONTIN) 300 MG capsule Take 1 capsule (300 mg total) by mouth at bedtime.   hydrALAZINE (APRESOLINE) 25 MG tablet Take 2 tablets (50 mg total) by mouth 3 (three) times daily.   ibuprofen (ADVIL) 800 MG tablet Take 1 tablet (800 mg total) by mouth every 8 (eight) hours as needed for moderate pain.   lidocaine-prilocaine (EMLA) cream APPLY 1 APPLICATION TOPICALLY AS NEEDED. FOR UNDER BREAST   losartan (COZAAR) 100 MG tablet TAKE 1 TABLET BY MOUTH EVERY DAY   Melaton-Thean-Cham-PassF-LBalm (MELATONIN + L-THEANINE) CAPS Take 2 capsules by mouth at bedtime as needed (sleep).   Multiple Vitamins-Minerals (MULTIVITAMIN GUMMIES ADULT PO) Take 2 capsules by mouth daily.   ondansetron (ZOFRAN-ODT) 8 MG disintegrating tablet Take 1 tablet by mouth every 8 (eight) hours as needed.   pindolol (VISKEN) 10 MG tablet Take 2 tablets (20 mg total) by mouth 2 (two) times daily.   Polyvinyl Alcohol-Povidone (CLEAR EYES ALL SEASONS OP) Place 1 drop into both eyes daily as needed (allergies/itchy eyes).   spironolactone (ALDACTONE) 100 MG tablet Take 1 tablet (100 mg total) by mouth at bedtime.   torsemide (DEMADEX) 20 MG tablet Take 1 tablet (20 mg total) by mouth every other day.   TRELEGY ELLIPTA 200-62.5-25 MCG/ACT AEPB USE AS  DIRECTED 1 PUFF IN THE MOUTH OR THROAT DAILY. TAKE 1 PUFF BY MOUTH EVERY DAY   triamcinolone (KENALOG) 0.025 % ointment APPLY 1 APPLICATION TOPICALLY 2 (TWO) TIMES DAILY AS NEEDED. (Patient taking differently: Apply 1 application  topically 2 (two) times daily as needed (eczema).)   Accu-Chek Softclix Lancets lancets Use to check blood sugar once daily. E11.9 (Patient not taking: Reported on 04/09/2023)   BD PEN NEEDLE NANO U/F 32G X 4 MM MISC USE 3 TIMES A DAY (Patient not taking: Reported on  04/09/2023)   conjugated estrogens (PREMARIN) vaginal cream Place 1 Applicatorful vaginally daily. (Patient not taking: Reported on 04/09/2023)   glucose blood (ACCU-CHEK AVIVA) test strip Use to check sugar up to 3 times daily (Patient not taking: Reported on 04/09/2023)   pantoprazole (PROTONIX) 40 MG tablet ONE TABLET 30 MIN BEFORE BREAKFAST (Patient not taking: Reported on 04/09/2023)   No facility-administered encounter medications on file as of 04/24/2023.    Allergies (verified) Bupropion and Norvasc [amlodipine]   History: Past Medical History:  Diagnosis Date   Arthritis    "knees" (02/21/2014), back   CHF (congestive heart failure) (HCC)    Chronic bronchitis (HCC)    "get it q yr" (02/21/2014)   Chronic diastolic CHF (congestive heart failure) (HCC)    a. Echo 10/23/2016: EF 75 %   Chronic lower back pain    Complication of anesthesia    "I don't come out well; I chew on my tongue"   COPD (chronic obstructive pulmonary disease) (HCC)    Diabetes mellitus without complication (HCC)    TYPE 2   Family history of adverse reaction to anesthesia    BROTHER PONV, had a blood clot several days later and heart stopped   Fatty liver    NONALCOLIC   Fibroid    Heart murmur    Hypertension    Infection of right eye    current dx on Saturday 7/15 - using drops   Leg swelling    bilateral   Migraine 1982-2009   MVP (mitral valve prolapse)    Neuromuscular disorder (HCC)    right leg nerve pain   Shortness of breath    WITH EXERTION   Sinus pause    a. noted on telemetry during admission from 10/2016, lasting up to 4.4 seconds. BB discontinued.    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   D/Dx: carpal tunnel, ulnar  neuropathy Less likely: cervical radiculopathy, vasculitis    Past Surgical History:  Procedure Laterality Date   CESAREAN SECTION  1982   CHOLECYSTECTOMY N/A 06/17/2017   Procedure: LAPAROSCOPIC CHOLECYSTECTOMY WITH INTRAOPERATIVE CHOLANGIOGRAM;  Surgeon: Manus Rudd, MD;  Location: [facility] OR;  Service: General;  Laterality: N/A;   COLONOSCOPY WITH PROPOFOL N/A 04/28/2017   Procedure: COLONOSCOPY WITH PROPOFOL;  Surgeon: Ruffin Frederick, MD;  Location: [facility] ENDOSCOPY;  Service: Gastroenterology;  Laterality: N/A;   DILATATION & CURETTAGE/HYSTEROSCOPY WITH MYOSURE N/A 05/06/2016   Procedure: DILATATION & CURETTAGE/HYSTEROSCOPY WITH MYOSURE;  Surgeon: Ok Edwards, MD;  Location: [facility] ORS;  Service: Gynecology;  Laterality: N/A;  request to follow around 8:45  requests one hour OR time   DILATATION & CURETTAGE/HYSTEROSCOPY  WITH MYOSURE N/A 04/03/2017   Procedure: DILATATION & CURETTAGE/HYSTEROSCOPY WITH MYOSURE;  Surgeon: Ok Edwards, MD;  Location: [facility] ORS;  Service: Gynecology;  Laterality: N/A;   DILATION AND CURETTAGE OF UTERUS     ESOPHAGOGASTRODUODENOSCOPY (EGD) WITH PROPOFOL N/A 04/28/2017   Procedure: ESOPHAGOGASTRODUODENOSCOPY (EGD) WITH PROPOFOL;  Surgeon: Ruffin Frederick, MD;  Location: [facility];  Service: Gastroenterology;  Laterality: N/A;   FOOT SURGERY Bilateral    "took bones out; put pins in"   IR ABLATE LIVER CRYOABLATION  02/08/2020   IR RADIOLOGIST EVAL & MGMT  02/03/2020   KNEE ARTHROSCOPY Left 08/16/2018   Procedure: ARTHROSCOPY KNEE PARTIAL MEDIAL MENISECTOMY, CHONDROPLASTY MEDIAL AND LATERAL, PLICA RELEASE MEDIAL;  Surgeon: Jodi Geralds, MD;  Location: [facility] OR;  Service: Orthopedics;  Laterality: Left;   LEFT AND RIGHT HEART CATHETERIZATION WITH CORONARY ANGIOGRAM N/A 03/31/2014   Procedure: LEFT AND RIGHT HEART CATHETERIZATION WITH CORONARY ANGIOGRAM;  Surgeon: Ricki Rodriguez, MD;  Location: [facility] CATH LAB;  Service: Cardiovascular;  Laterality: N/A;    MYOMECTOMY  YRS AGO   sonohystogram  04/14/2016   [facility]: intramural fibroid, premenopausal endometrium, right fluid filled tubular structure   Family History  Problem Relation Age of Onset   Hypertension Mother    COPD Mother    Hypertension Father    Cancer Sister        UTERINE???   COPD Sister    Hypertension Sister    Hypertension Sister    Esophageal cancer Neg Hx    Liver disease Neg Hx    Colon cancer Neg Hx    Social History   Socioeconomic History   Marital status: Divorced    Spouse name: Not on file   Number of children: 1   Years of education: 12   Highest education level: 12th grade  Occupational History   Occupation: Set designer    Comment: disabled   Occupation: patient care assistance  Tobacco Use   Smoking status: Every Day    Packs/day: 0.50    Years: 42.00    Additional pack years: 0.00    Total pack years: 21.00    Types: Cigarettes    Start date: 10/20/1976   Smokeless tobacco: Never   Tobacco comments:    Previous 1 ppd smoker x > 40 years.  Now smokes 10 cigarettes per day  Vaping Use   Vaping Use: Never used  Substance and Sexual Activity   Alcohol use: Yes    Comment: very occasional   Drug use: Yes    Types: "Crack" cocaine, Marijuana    Comment: 03/30/2014 "stopped using crack 02/21/2014"   Sexual activity: Yes    Birth control/protection: Post-menopausal  Other Topics Concern   Not on file  Social History Narrative   Divorced since 2015.   Lives alone. No pets. Daughter lives in [place]. Daughter helps with transportation. Talks daily on phone, sees once or twice a week. 2 grandkids.   Enjoys church at [employer], [place] geared towards people with substance abuse history.    Lives in duplex apartment, one level, three stairs to get in. Has handrails, has grab bars in bathroom. Smoke alarms.   Bakes most meals, avoids starches, rice. Eats vegetables. Drinks tea, ginger ale.    Likes to go to  movies, used to walk daily but not now due to knee pain. Likes to spend time with grandchildren, go out to eat. Spend time with family and circle of friends.    Social  Determinants of Health   Financial Resource Strain: Low Risk  (04/24/2023)   Overall Financial Resource Strain (CARDIA)    Difficulty of Paying Living Expenses: Not hard at all  Food Insecurity: No Food Insecurity (04/24/2023)   Hunger Vital Sign    Worried About Running Out of Food in the Last Year: Never true    Ran Out of Food in the Last Year: Never true  Transportation Needs: No Transportation Needs (04/24/2023)   PRAPARE - Administrator, Civil Service (Medical): No    Lack of Transportation (Non-Medical): No  Physical Activity: Inactive (04/24/2023)   Exercise Vital Sign    Days of Exercise per Week: 0 days    Minutes of Exercise per Session: 0 min  Stress: No Stress Concern Present (04/24/2023)   [employer] of Occupational Health - Occupational Stress Questionnaire    Feeling of Stress : Not at all  Social Connections: Moderately Isolated (04/24/2023)   Social Connection and Isolation Panel [NHANES]    Frequency of Communication with Friends and Family: Three times a week    Frequency of Social Gatherings with Friends and Family: Once a week    Attends Religious Services: More than 4 times per year    Active Member of [employer] or Organizations: No    Attends Banker Meetings: Never    Marital Status: Divorced    Tobacco Counseling Ready to quit: Not Answered Counseling given: Not Answered Tobacco comments: Previous 1 ppd smoker x > 40 years.  Now smokes 10 cigarettes per day   Clinical Intake:  Pre-visit preparation completed: Yes  Pain : No/denies pain     Diabetes: Yes CBG done?: No Did pt. bring in CBG monitor from home?: No  How often do you need to have someone help you when you read instructions, pamphlets, or other written materials from your doctor or pharmacy?: 1 -  Never  Interpreter Needed?: No  Information entered by :: Kandis Fantasia LPN   Activities of Daily Living    04/24/2023    9:44 AM  In your present state of health, do you have any difficulty performing the following activities:  Hearing? 0  Vision? 0  Difficulty concentrating or making decisions? 0  Walking or climbing stairs? 1  Dressing or bathing? 0  Doing errands, shopping? 0  Preparing Food and eating ? N  Using the Toilet? N  In the past six months, have you accidently leaked urine? N  Do you have problems with loss of bowel control? N  Managing your Medications? N  Managing your Finances? N  Housekeeping or managing your Housekeeping? N    Patient Care Team: Jerre Simon, MD as PCP - General (Family Medicine) Jake Bathe, MD as PCP - Cardiology (Cardiology) Linna Darner, RD as Dietitian (Family Medicine) Jodi Geralds, MD as Consulting Physician (Orthopedic Surgery) Monica Becton, MD as Consulting Physician (Family Medicine) Armbruster, Willaim Rayas, MD as Consulting Physician (Gastroenterology) Juanell Fairly, RN as [facility] Care Management Juanell Fairly, RN as Case Manager Armbruster, Willaim Rayas, MD as Consulting Physician (Gastroenterology) Mammography, [facility] (Diagnostic Radiology) [facility]  Indicate any recent Medical Services you may have received from other than [facility] providers in the past year (date may be approximate).     Assessment:   This is a routine wellness examination for [place].  Hearing/Vision screen Hearing Screening - Comments:: Denies hearing difficulties   Vision Screening - Comments:: Wears rx glasses -  up to date with routine eye exams with [facility])   Dietary issues and exercise activities discussed:     Goals Addressed             This Visit's Progress    Increase physical activity         Depression Screen    04/24/2023    9:43 AM 03/12/2023   10:33 AM  03/09/2023   11:11 AM 10/23/2022   10:29 AM 09/16/2022   11:29 AM 08/28/2022   11:14 AM 08/11/2022   11:38 AM  PHQ 2/9 Scores  PHQ - 2 Score 0 0 0 0 0 1 1  PHQ- 9 Score 7 7 6  6 8 7     Fall Risk    04/24/2023    9:44 AM 03/12/2023   10:22 AM 04/29/2022   10:58 AM 07/08/2021   10:54 AM 07/03/2020   11:18 AM  Fall Risk   Falls in the past year? 0 0 0 0 0  Number falls in past yr: 0  0  0  Injury with Fall? 0  0  0  Risk for fall due to : No Fall Risks      Follow up Falls prevention discussed;Education provided;Falls evaluation completed        MEDICARE RISK AT HOME:  Medicare Risk at Home - 04/24/23 0944     Any stairs in or around the home? Yes    If so, are there any without handrails? No    Home free of loose throw rugs in walkways, pet beds, electrical cords, etc? Yes    Adequate lighting in your home to reduce risk of falls? Yes    Life alert? No    Use of a cane, walker or w/c? No    Grab bars in the bathroom? Yes    Shower chair or bench in shower? No    Elevated toilet seat or a handicapped toilet? No             TIMED UP AND GO:  Was the test performed?  No    Cognitive Function:    09/07/2018   10:37 AM  MMSE - Mini Mental State Exam  Orientation to time 5  Orientation to Place 5  Registration 3  Attention/ Calculation 5  Recall 3  Language- name 2 objects 2  Language- repeat 1  Language- follow 3 step command 3  Language- read & follow direction 1  Write a sentence 1  Copy design 1  Total score 30        04/24/2023    9:44 AM 09/07/2018   10:38 AM  6CIT Screen  What Year? 0 points 0 points  What month? 0 points 0 points  What time? 0 points 0 points  Count back from 20 0 points 0 points  Months in reverse 0 points 0 points  Repeat phrase 0 points 0 points  Total Score 0 points 0 points    Immunizations Immunization History  Administered Date(s) Administered   Influenza,inj,Quad PF,6+ Mos 06/23/2014, 12/07/2015, 08/07/2016, 09/21/2017,  09/07/2018, 08/19/2021   Influenza-Unspecified 07/11/2020   Moderna Sars-Covid-2 Vaccination 03/23/2020, 04/20/2020   PNEUMOCOCCAL CONJUGATE-20 10/23/2021   Pneumococcal Polysaccharide-23 03/31/2014    TDAP status: Due, Education has been provided regarding the importance of this vaccine. Advised may receive this vaccine at local pharmacy or Health Dept. Aware to provide a copy of the vaccination record if obtained from local pharmacy or Health Dept. Verbalized acceptance and understanding.  Pneumococcal vaccine status: Up to date  Covid-19 vaccine status: Information provided on how to obtain vaccines.   Qualifies for Shingles Vaccine? Yes   Zostavax completed No   Shingrix Completed?: No.    Education has been provided regarding the importance of this vaccine. Patient has been advised to call insurance company to determine out of pocket expense if they have not yet received this vaccine. Advised may also receive vaccine at local pharmacy or Health Dept. Verbalized acceptance and understanding.  Screening Tests Health Maintenance  Topic Date Due   OPHTHALMOLOGY EXAM  03/07/2020   COVID-19 Vaccine (3 - 2023-24 season) 06/20/2022   PAP SMEAR-Modifier  08/29/2022   Lung Cancer Screening  09/10/2022   Zoster Vaccines- Shingrix (1 of 2) 07/25/2023 (Originally 03/04/2013)   INFLUENZA VACCINE  05/21/2023   FOOT EXAM  05/22/2023   MAMMOGRAM  10/01/2023   HEMOGLOBIN A1C  10/10/2023   Diabetic kidney evaluation - eGFR measurement  03/29/2024   Diabetic kidney evaluation - Urine ACR  04/09/2024   Medicare Annual Wellness (AWV)  04/23/2024   Colonoscopy  04/28/2024   Hepatitis C Screening  Completed   HIV Screening  Completed   HPV VACCINES  Aged Out   DTaP/Tdap/Td  Discontinued    Health Maintenance  Health Maintenance Due  Topic Date Due   OPHTHALMOLOGY EXAM  03/07/2020   COVID-19 Vaccine (3 - 2023-24 season) 06/20/2022   PAP SMEAR-Modifier  08/29/2022   Lung Cancer Screening   09/10/2022    Colorectal cancer screening: Type of screening: Colonoscopy. Completed 05/08/17. Repeat every 7 years  Mammogram status: Completed 09/30/22. Repeat every year  Lung Cancer Screening: (Low Dose CT Chest recommended if Age 47-80 years, 20 pack-year currently smoking OR have quit w/in 15years.) does qualify.   Lung Cancer Screening Referral: scheduled for 04/29/23  Additional Screening:  Hepatitis C Screening: does qualify; Completed 02/17/17  Vision Screening: Recommended annual ophthalmology exams for early detection of glaucoma and other disorders of the eye. Is the patient up to date with their annual eye exam?  Yes  Who is the provider or what is the name of the office in which the patient attends annual eye exams? [facility]) If pt is not established with a provider, would they like to be referred to a provider to establish care? No .   Dental Screening: Recommended annual dental exams for proper oral hygiene  Diabetic Foot Exam: Diabetic Foot Exam: Completed 05/21/22  Community Resource Referral / Chronic Care Management: CRR required this visit?  No   CCM required this visit?  No     Plan:     I have personally reviewed and noted the following in the patient's chart:   Medical and social history Use of alcohol, tobacco or illicit drugs  Current medications and supplements including opioid prescriptions. Patient is not currently taking opioid prescriptions. Functional ability and status Nutritional status Physical activity Advanced directives List of other physicians Hospitalizations, surgeries, and ER visits in previous 12 months Vitals Screenings to include cognitive, depression, and falls Referrals and appointments  In addition, I have reviewed and discussed with patient certain preventive protocols, quality metrics, and best practice recommendations. A written personalized care plan for preventive services as well as general preventive health  recommendations were provided to patient.     Kandis Fantasia [place], [place]   12/26/7562   After Visit Summary: (MyChart) Due to this being a telephonic visit, the after visit summary with patients personalized plan was offered to patient  via MyChart   Nurse Notes: Patient complains of left hip pain; plans to contact orthopedics

## 2023-04-24 ENCOUNTER — Ambulatory Visit (INDEPENDENT_AMBULATORY_CARE_PROVIDER_SITE_OTHER): Payer: 59

## 2023-04-24 VITALS — Ht 62.0 in | Wt 229.0 lb

## 2023-04-24 DIAGNOSIS — Z0000 Encounter for general adult medical examination without abnormal findings: Secondary | ICD-10-CM

## 2023-04-29 ENCOUNTER — Encounter: Payer: Self-pay | Admitting: Student

## 2023-04-29 ENCOUNTER — Ambulatory Visit (HOSPITAL_COMMUNITY)
Admission: RE | Admit: 2023-04-29 | Discharge: 2023-04-29 | Disposition: A | Discharge status: Home / Self Care | Payer: 59 | Source: Ambulatory Visit | Attending: Family Medicine | Admitting: Family Medicine

## 2023-04-29 DIAGNOSIS — Z122 Encounter for screening for malignant neoplasm of respiratory organs: Secondary | ICD-10-CM | POA: Insufficient documentation

## 2023-04-29 DIAGNOSIS — Z87891 Personal history of nicotine dependence: Secondary | ICD-10-CM | POA: Diagnosis not present

## 2023-04-29 DIAGNOSIS — J432 Centrilobular emphysema: Secondary | ICD-10-CM | POA: Diagnosis not present

## 2023-04-29 DIAGNOSIS — I700 Atherosclerosis of aorta: Secondary | ICD-10-CM | POA: Diagnosis not present

## 2023-04-29 DIAGNOSIS — I2510 Atherosclerotic heart disease of native coronary artery without angina pectoris: Secondary | ICD-10-CM | POA: Insufficient documentation

## 2023-05-01 DIAGNOSIS — G4733 Obstructive sleep apnea (adult) (pediatric): Secondary | ICD-10-CM | POA: Diagnosis not present

## 2023-05-03 ENCOUNTER — Other Ambulatory Visit: Payer: Self-pay | Admitting: Student

## 2023-05-04 DIAGNOSIS — M79641 Pain in right hand: Secondary | ICD-10-CM | POA: Diagnosis not present

## 2023-05-11 DIAGNOSIS — G5602 Carpal tunnel syndrome, left upper limb: Secondary | ICD-10-CM | POA: Diagnosis not present

## 2023-05-11 DIAGNOSIS — G5601 Carpal tunnel syndrome, right upper limb: Secondary | ICD-10-CM | POA: Diagnosis not present

## 2023-05-11 DIAGNOSIS — Z4789 Encounter for other orthopedic aftercare: Secondary | ICD-10-CM | POA: Diagnosis not present

## 2023-05-14 ENCOUNTER — Ambulatory Visit: Payer: 59 | Admitting: Student

## 2023-05-19 ENCOUNTER — Ambulatory Visit (INDEPENDENT_AMBULATORY_CARE_PROVIDER_SITE_OTHER): Payer: 59 | Admitting: Family Medicine

## 2023-05-19 ENCOUNTER — Encounter: Payer: Self-pay | Admitting: Family Medicine

## 2023-05-19 ENCOUNTER — Other Ambulatory Visit (HOSPITAL_COMMUNITY)
Admission: RE | Admit: 2023-05-19 | Discharge: 2023-05-19 | Disposition: A | Discharge status: Home / Self Care | Payer: 59 | Source: Ambulatory Visit | Attending: Family Medicine | Admitting: Family Medicine

## 2023-05-19 VITALS — BP 192/101 | HR 72 | Ht 62.0 in | Wt 225.0 lb

## 2023-05-19 DIAGNOSIS — N950 Postmenopausal bleeding: Secondary | ICD-10-CM | POA: Insufficient documentation

## 2023-05-19 DIAGNOSIS — I10 Essential (primary) hypertension: Secondary | ICD-10-CM

## 2023-05-19 NOTE — Patient Instructions (Addendum)
It was wonderful to see you today!  Today we discussed your postmenopausal bleeding. We ran tests for common conditions that can cause irritation or bleeding in the vagina, including STIs, and referred you for a Transvaginal Ultrasound. Once we have the results of the tests and the imaging we ordered, we will have you schedule a follow up exam.   We also briefly discussed your high blood pressure. Because your blood pressure at home is not the same as it is here, I would like you to follow up in four weeks with your pcp. Please bring a log of your daily blood pressure with you to that visit to better help your doctor evaluate what the next steps may be.   Thank you for choosing [facility].   Please call [phone] with any questions about today's appointment.   If you had blood work today, I will send you a MyChart message or a letter if results are normal. Otherwise, I will give you a call.   If you had a referral placed, they will call you to set up an appointment. Please give [place] a call if you don't hear back in the next 2 weeks.   If you need additional refills before your next appointment, please call your pharmacy first.   Gerrit Heck, DO Family Medicine

## 2023-05-19 NOTE — Progress Notes (Signed)
    SUBJECTIVE:   CHIEF COMPLAINT / HPI:   Fu for chronic conditions- HTN, Tobacco use cessation - Home readings 130/80s-90s -Does have a headache and some stress.   Postmenopausal bleeding for two days. Dark brown blood, on the toilet paper during wiping. Having cramping like she's having a period. Has a hx of fibroids, but had them removed in 2017 and 2018. No changes in her sexual partners or habits in the last 2 years. Has some pain with intercourse but this is due to the anatomy of her partner in her opinion. Denies vaginal dryness.   Defer smoking cessation follow up at this time  PERTINENT  PMH / PSH: HTN, tobacco use, T2DM  OBJECTIVE:   BP (!) 192/101   Pulse 72   Ht 5\' 2"  (1.575 m)   Wt 225 lb (102.1 kg)   LMP 04/06/2017   SpO2 98%   BMI 41.15 kg/m   General: A&O, NAD Cardiac: RRR, no m/r/g Respiratory: CTAB, normal WOB, no w/c/r GU: Cleatrice Burke present as chaperone. Normal external genitalia, no obvious plaques or irritation. Normal vaginal mucosa. Scant dark brown blood in vaginal vault. Parous cervix visualized, no obvious deformities. Tender to touch with swab.   ASSESSMENT/PLAN:   Postmenopausal bleeding Visualized blood on speculum exam, but no physical defects in the vaginal wall or cervix to explain. Collected Aptima swab to test for GC/Chlamydia, trich, BV and yeast. Placed referral for TVUS to evaluate for intrauterine sources. Follow up once results are obtained.   Hypertension BP today not at goal. Patient reports lower readings at home. Instructed patient to take and record home blood pressure readings for the next 4 weeks, then bring the logs with her to her follow up appointment. Patient may need an ambulatory BP monitor study to further evaluate this discrepancy. May also consider referral to Dr. Raymondo Band for BP management as she is on multiple medications with questionable effect on her BP.     Gerrit Heck, DO [facility]

## 2023-05-19 NOTE — Assessment & Plan Note (Signed)
BP today not at goal. Patient reports lower readings at home. Instructed patient to take and record home blood pressure readings for the next 4 weeks, then bring the logs with her to her follow up appointment. Patient may need an ambulatory BP monitor study to further evaluate this discrepancy. May also consider referral to Dr. Raymondo Band for BP management as she is on multiple medications with questionable effect on her BP.

## 2023-05-19 NOTE — Assessment & Plan Note (Signed)
Visualized blood on speculum exam, but no physical defects in the vaginal wall or cervix to explain. Collected Aptima swab to test for GC/Chlamydia, trich, BV and yeast. Placed referral for TVUS to evaluate for intrauterine sources. Follow up once results are obtained.

## 2023-05-22 ENCOUNTER — Telehealth: Payer: Self-pay | Admitting: Family Medicine

## 2023-05-22 ENCOUNTER — Ambulatory Visit (HOSPITAL_COMMUNITY)
Admission: RE | Admit: 2023-05-22 | Discharge: 2023-05-22 | Disposition: A | Discharge status: Home / Self Care | Payer: 59 | Source: Ambulatory Visit | Attending: Family Medicine | Admitting: Family Medicine

## 2023-05-22 DIAGNOSIS — N950 Postmenopausal bleeding: Secondary | ICD-10-CM | POA: Diagnosis not present

## 2023-05-22 NOTE — Telephone Encounter (Signed)
Contacted the patient by phone, and after verifying name and date of birth discussed with patient her headache symptoms noted on 7/30. She does not report any headache or blurred vision at this time. Patient does report that her headaches tend to occur when her blood pressure is high. Discussed with patient that if her headache returns, she should check her blood pressure, and if it is higher than 170/100, she should go to the emergency department for further evaluation. She voiced understanding and agreement with the plan. Of note, she has an appointment with her cardiologist next week on 8/7 and follow up with [facility] on 8/27.

## 2023-05-26 DIAGNOSIS — M25552 Pain in left hip: Secondary | ICD-10-CM | POA: Diagnosis not present

## 2023-05-26 DIAGNOSIS — M25551 Pain in right hip: Secondary | ICD-10-CM | POA: Diagnosis not present

## 2023-05-26 DIAGNOSIS — M5451 Vertebrogenic low back pain: Secondary | ICD-10-CM | POA: Diagnosis not present

## 2023-05-26 NOTE — Progress Notes (Unsigned)
Office Visit    Patient Name: Hailey Barnes Date of Encounter: 05/27/2023  Primary Care Provider:  Jerre Simon, MD Primary Cardiologist:  Donato Schultz, MD Primary Electrophysiologist: None   Past Medical History    Past Medical History:  Diagnosis Date   Arthritis    "knees" (02/21/2014), back   CHF (congestive heart failure) (HCC)    Chronic bronchitis (HCC)    "get it q yr" (02/21/2014)   Chronic diastolic CHF (congestive heart failure) (HCC)    a. Echo 10/23/2016: EF 75 %   Chronic lower back pain    Complication of anesthesia    "I don't come out well; I chew on my tongue"   COPD (chronic obstructive pulmonary disease) (HCC)    Diabetes mellitus without complication (HCC)    TYPE 2   Family history of adverse reaction to anesthesia    BROTHER PONV, had a blood clot several days later and heart stopped   Fatty liver    NONALCOLIC   Fibroid    Heart murmur    Hypertension    Infection of right eye    current dx on Saturday 7/15 - using drops   Leg swelling    bilateral   Migraine 1982-2009   MVP (mitral valve prolapse)    Neuromuscular disorder (HCC)    right leg nerve pain   Shortness of breath    WITH EXERTION   Sinus pause    a. noted on telemetry during admission from 10/2016, lasting up to 4.4 seconds. BB discontinued.    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   D/Dx: carpal tunnel, ulnar neuropathy Less likely: cervical radiculopathy, vasculitis    Past Surgical History:  Procedure Laterality Date   CESAREAN SECTION  1982   CHOLECYSTECTOMY N/A 06/17/2017   Procedure: LAPAROSCOPIC CHOLECYSTECTOMY WITH INTRAOPERATIVE CHOLANGIOGRAM;  Surgeon: Manus Rudd, MD;  Location: [facility] OR;   Service: General;  Laterality: N/A;   COLONOSCOPY WITH PROPOFOL N/A 04/28/2017   Procedure: COLONOSCOPY WITH PROPOFOL;  Surgeon: Ruffin Frederick, MD;  Location: [facility] ENDOSCOPY;  Service: Gastroenterology;  Laterality: N/A;   DILATATION & CURETTAGE/HYSTEROSCOPY WITH MYOSURE N/A 05/06/2016   Procedure: DILATATION & CURETTAGE/HYSTEROSCOPY WITH MYOSURE;  Surgeon: Ok Edwards, MD;  Location: [facility] ORS;  Service: Gynecology;  Laterality: N/A;  request to follow around 8:45  requests one hour OR time   DILATATION & CURETTAGE/HYSTEROSCOPY WITH MYOSURE N/A 04/03/2017   Procedure: DILATATION & CURETTAGE/HYSTEROSCOPY WITH MYOSURE;  Surgeon: Ok Edwards, MD;  Location: [facility] ORS;  Service: Gynecology;  Laterality: N/A;   DILATION AND CURETTAGE OF UTERUS     ESOPHAGOGASTRODUODENOSCOPY (EGD) WITH PROPOFOL N/A 04/28/2017   Procedure: ESOPHAGOGASTRODUODENOSCOPY (EGD) WITH PROPOFOL;  Surgeon: Ruffin Frederick, MD;  Location: [facility] END[facility];  Service: Gastroenterology;  Laterality: N/A;   FOOT SURGERY Bilateral    "took bones out; put pins in"   IR ABLATE LIVER CRYOABLATION  02/08/2020   IR RADIOLOGIST EVAL & MGMT  02/03/2020   KNEE ARTHROSCOPY Left 08/16/2018   Procedure: ARTHROSCOPY KNEE PARTIAL MEDIAL MENISECTOMY, CHONDROPLASTY MEDIAL AND LATERAL, PLICA RELEASE MEDIAL;  Surgeon: Jodi Geralds, MD;  Location: [facility] OR;  Service: Orthopedics;  Laterality:  Left;   LEFT AND RIGHT HEART CATHETERIZATION WITH CORONARY ANGIOGRAM N/A 03/31/2014   Procedure: LEFT AND RIGHT HEART CATHETERIZATION WITH CORONARY ANGIOGRAM;  Surgeon: Ricki Rodriguez, MD;  Location: [facility] CATH LAB;  Service: Cardiovascular;  Laterality: N/A;   MYOMECTOMY  YRS AGO   sonohystogram  04/14/2016   [facility]: intramural fibroid, premenopausal endometrium, right fluid filled tubular structure    Allergies  Allergies  Allergen Reactions   Bupropion Other (See Comments)    Throat soreness and difficulty swallowing.     Norvasc [Amlodipine] Swelling    Significant leg swelling with 10mg . Able to tolerate 2.5mg .      History of Present Illness    Hailey Barnes is a 62 y.o. female with PMH of HTN, diastolic CHF, COPD, HLD, DM 2, CVA, tobacco abuse, migraines who presents today for follow-up of HTN and HFpEF who presents today for 21-month follow-up.  Hailey Barnes was initially seen by Dr. Anne Fu in 2017 following hospitalization for shortness of breath.  She was seen last in follow-up on 07/22/2022.  During her visit patient endorsed occasional episodes of tachycardia that were relieved with vagal maneuver.  She was referred to EP in 2021 for SVT  and agreed to have SVT ablation but changed her mind.  She was seen most recently by her PCP and was found to have elevated BP.  Since last being seen in the office patient reports she has been experiencing dizziness and the sensation of walking sideways since this morning.  She denies any visual changes or chest pain.  She she does report wearing new glasses may be contributing to symptoms her blood pressure today is elevated at 130/100 and was 170/98 on recheck.  She reports being out of her spironolactone for the past 2 to 3 weeks.  She has otherwise been compliant with her medications and denies any adverse reactions.  She reports episodes of anxiety that are associated with tachycardia.  She endorses bouts of tachycardia that occur throughout the day and are relieved with rest.  Today's visit we discussed the importance of following a low-sodium heart healthy diet.  We also discussed the verification of untreated hypertension such as TOD and stroke.  Patient denies chest pain, palpitations, dyspnea, PND, orthopnea, nausea, vomiting, dizziness, syncope, edema, weight gain, or early satiety.  Home Medications    Current Outpatient Medications  Medication Sig Dispense Refill   Accu-Chek Softclix Lancets lancets Use to check blood sugar once daily. E11.9 100 each 12    allopurinol (ZYLOPRIM) 300 MG tablet Take 1 tablet (300 mg total) by mouth daily. 90 tablet 1   amLODipine (NORVASC) 2.5 MG tablet TAKE 1 TABLET BY MOUTH AT BEDTIME. 90 tablet 3   aspirin EC 81 MG tablet Take 81 mg by mouth daily.     atorvastatin (LIPITOR) 40 MG tablet Take 1 tablet (40 mg total) by mouth daily. 90 tablet 3   BD PEN NEEDLE NANO U/F 32G X 4 MM MISC USE 3 TIMES A DAY 200 each 4   Blood Pressure Monitoring (BLOOD PRESSURE CUFF) MISC 1 each by Does not apply route daily. 1 each 0   conjugated estrogens (PREMARIN) vaginal cream Place 1 Applicatorful vaginally daily. 42.5 g 12   fluticasone (FLONASE) 50 MCG/ACT nasal spray Place 2 sprays into both nostrils daily. 16 g 6   glucose blood (ACCU-CHEK AVIVA) test strip Use to check sugar up to 3 times daily 100 each 12   ibuprofen (ADVIL) 800 MG tablet  Take 1 tablet (800 mg total) by mouth every 8 (eight) hours as needed for moderate pain. 20 tablet 0   lidocaine-prilocaine (EMLA) cream APPLY 1 APPLICATION TOPICALLY AS NEEDED. FOR UNDER BREAST 30 g 2   losartan (COZAAR) 100 MG tablet TAKE 1 TABLET BY MOUTH EVERY DAY 90 tablet 3   Melaton-Thean-Cham-PassF-LBalm (MELATONIN + L-THEANINE) CAPS Take 2 capsules by mouth at bedtime as needed (sleep).     ondansetron (ZOFRAN) 4 MG tablet Take 4 mg by mouth as needed for nausea.     ondansetron (ZOFRAN-ODT) 8 MG disintegrating tablet Take 1 tablet by mouth every 8 (eight) hours as needed.     pindolol (VISKEN) 10 MG tablet Take 2 tablets (20 mg total) by mouth 2 (two) times daily. 360 tablet 3   Polyvinyl Alcohol-Povidone (CLEAR EYES ALL SEASONS OP) Place 1 drop into both eyes daily as needed (allergies/itchy eyes).     torsemide (DEMADEX) 20 MG tablet Take 1 tablet (20 mg total) by mouth every other day.     TRELEGY ELLIPTA 200-62.5-25 MCG/ACT AEPB USE AS DIRECTED 1 PUFF IN THE MOUTH OR THROAT DAILY. TAKE 1 PUFF BY MOUTH EVERY DAY 60 each 3   triamcinolone (KENALOG) 0.025 % ointment APPLY 1  APPLICATION TOPICALLY 2 (TWO) TIMES DAILY AS NEEDED. (Patient taking differently: Apply 1 application  topically 2 (two) times daily as needed (eczema).) 75 g 0   hydrALAZINE (APRESOLINE) 100 MG tablet Take 1 tablet (100 mg total) by mouth daily. 30 tablet 1   spironolactone (ALDACTONE) 25 MG tablet Take 1 tablet (25 mg total) by mouth at bedtime. 30 tablet 1   No current facility-administered medications for this visit.     Review of Systems  Please see the history of present illness.    (+) Tachycardia (+) Dizziness  All other systems reviewed and are otherwise negative except as noted above.  Physical Exam    Wt Readings from Last 3 Encounters:  05/27/23 225 lb 12.8 oz (102.4 kg)  05/19/23 225 lb (102.1 kg)  04/24/23 229 lb (103.9 kg)   VS: Vitals:   05/27/23 1335 05/27/23 1358  BP: (!) 130/100 (!) 170/98  Pulse: 69   SpO2: 96%   ,Body mass index is 41.3 kg/m.  Constitutional:      Appearance: Healthy appearance. Not in distress.  Neck:     Vascular: JVD normal.  Pulmonary:     Effort: Pulmonary effort is normal.     Breath sounds: No wheezing. No rales. Diminished in the bases Cardiovascular:     Normal rate. Regular rhythm. Normal S1. Normal S2.      Murmurs: There is no murmur.  Edema:    Peripheral edema absent.  Abdominal:     Palpations: Abdomen is soft non tender. There is no hepatomegaly.  Skin:    General: Skin is warm and dry.  Neurological:     General: No focal deficit present.     Mental Status: Alert and oriented to person, place and time.     Cranial Nerves: Cranial nerves are intact.  EKG/LABS/ Recent Cardiac Studies    ECG personally reviewed by me today -sinus rhythm with rate of 69 bpm and possible LAE no acute changes consistent with previous EKG.  Lab Results  Component Value Date   WBC 16.2 (H) 11/07/2022   HGB 16.0 (H) 11/07/2022   HCT 48.6 (H) 11/07/2022   MCV 86.6 11/07/2022   PLT 226 11/07/2022   Lab Results  Component Value  Date   CREATININE 0.88 03/30/2023   BUN 14 03/30/2023   NA 143 03/30/2023   K 4.7 03/30/2023   CL 105 03/30/2023   CO2 27 03/30/2023   Lab Results  Component Value Date   ALT 15 11/07/2022   AST 14 (L) 11/07/2022   GGT 46 03/19/2018   ALKPHOS 83 11/07/2022   BILITOT 0.4 11/07/2022   Lab Results  Component Value Date   CHOL 159 04/10/2023   HDL 56 04/10/2023   LDLCALC 82 04/10/2023   TRIG 118 04/10/2023   CHOLHDL 2.8 04/10/2023    Lab Results  Component Value Date   HGBA1C 5.7 04/10/2023     Assessment & Plan    1.  Essential hypertension: -HYPERTENSION CONTROL Vitals:   05/27/23 1335 05/27/23 1358  BP: (!) 130/100 (!) 170/98    The patient's blood pressure is elevated above target today.  In order to address the patient's elevated BP: Blood pressure will be monitored at home to determine if medication changes need to be made.; A new medication was prescribed today.; Follow up with primary care provider for management.; Follow up with general cardiology has been recommended.   -We will increase hydralazine to 2 g 3 times daily -Decrease Aldactone to 25 mg daily -Continue Norvasc 2.5 mg and losartan 100 mg daily -If patient's blood pressure is elevated at follow-up we will plan to refer to Pharm.D. for assistance.  2.HFpEF: -EF of 65-70%, no RWMA, mild LVH, grade 1 DD, with normal valve function. -Patient is euvolemic today -Continue Aldactone 25 mg daily, hydralazine 100 mg 3 times daily and losartan 100 mg daily -Low sodium diet, fluid restriction <2L, and daily weights encouraged. Educated to contact our office for weight gain of 2 lbs overnight or 5 lbs in one week.   3.Nonobstructive CAD: -CT was completed that revealed no evidence of CAD with dilated main pulmonary artery of 35 mm suggesting HTN.    4. History of SVT: -Patient was referred to EP in 2021 and agreed to have SVT ablation however changed her mind -Today patient reports bouts of tachycardia  that are associated with chest discomfort and dizziness. -Patient will wear a 14-day ZIO monitor evaluate for arrhythmia burden.  5.  Tobacco abuse: -Patient reports that she continues to smoke half a pack per day. -She is interested in nicotine patches and will discuss further at her upcoming follow-up.  6.  DM type II: -Patient's last hemoglobin A1c was 5.7 -Continue current treatment plan per PCP Disposition: Follow-up with Donato Schultz, MD or APP in 1 months    Medication Adjustments/Labs and Tests Ordered: Current medicines are reviewed at length with the patient today.  Concerns regarding medicines are outlined above.   Signed, Napoleon Form, Leodis Rains, NP 05/27/2023, 4:35 PM [facility]

## 2023-05-27 ENCOUNTER — Ambulatory Visit: Payer: 59 | Attending: Nurse Practitioner | Admitting: Nurse Practitioner

## 2023-05-27 ENCOUNTER — Encounter: Payer: Self-pay | Admitting: Nurse Practitioner

## 2023-05-27 ENCOUNTER — Other Ambulatory Visit: Payer: Self-pay | Admitting: Nurse Practitioner

## 2023-05-27 ENCOUNTER — Ambulatory Visit (INDEPENDENT_AMBULATORY_CARE_PROVIDER_SITE_OTHER): Payer: 59

## 2023-05-27 VITALS — BP 170/98 | HR 69 | Ht 62.0 in | Wt 225.8 lb

## 2023-05-27 DIAGNOSIS — I5032 Chronic diastolic (congestive) heart failure: Secondary | ICD-10-CM | POA: Diagnosis not present

## 2023-05-27 DIAGNOSIS — I10 Essential (primary) hypertension: Secondary | ICD-10-CM

## 2023-05-27 DIAGNOSIS — I2510 Atherosclerotic heart disease of native coronary artery without angina pectoris: Secondary | ICD-10-CM

## 2023-05-27 DIAGNOSIS — I471 Supraventricular tachycardia, unspecified: Secondary | ICD-10-CM

## 2023-05-27 DIAGNOSIS — E1169 Type 2 diabetes mellitus with other specified complication: Secondary | ICD-10-CM

## 2023-05-27 MED ORDER — HYDRALAZINE HCL 100 MG PO TABS: 100.0000 mg | ORAL_TABLET | Freq: Every day | ORAL | 1 refills | Status: DC

## 2023-05-27 MED ORDER — BLOOD PRESSURE CUFF MISC: 1.0000 | Freq: Every day | 0 refills | Status: DC

## 2023-05-27 MED ORDER — SPIRONOLACTONE 25 MG PO TABS: 25.0000 mg | ORAL_TABLET | Freq: Every day | ORAL | 1 refills | Status: DC

## 2023-05-27 NOTE — Progress Notes (Unsigned)
ZIO XT serial # [id number] from office inventory applied to patient.

## 2023-05-27 NOTE — Patient Instructions (Addendum)
Medication Instructions:  INCREASE Hydralazine 100mg  Take 1 tablet once a day  CHANGE Spironolactone 25mg  take 1 tablet once a day  Prescription will be give for blood pressure cuff *If you need a refill on your cardiac medications before your next appointment, please call your pharmacy*   Lab Work: None ordered   Testing/Procedures: ZIO XT- Long Term Monitor Instructions  Your physician has requested you wear a ZIO patch monitor for 14 days.  This is a single patch monitor. [employer] supplies one patch monitor per enrollment. Additional stickers are not available. Please do not apply patch if you will be having a Nuclear Stress Test,  Echocardiogram, Cardiac CT, MRI, or Chest Xray during the period you would be wearing the  monitor. The patch cannot be worn during these tests. You cannot remove and re-apply the  ZIO XT patch monitor.  Your ZIO patch monitor will be mailed 3 day USPS to your address on file. It may take 3-5 days  to receive your monitor after you have been enrolled.  Once you have received your monitor, please review the enclosed instructions. Your monitor  has already been registered assigning a specific monitor serial # to you.  Billing and Patient Assistance Program Information  We have supplied [employer] with any of your insurance information on file for billing purposes. [employer] offers a sliding scale Patient Assistance Program for patients that do not have  insurance, or whose insurance does not completely cover the cost of the ZIO monitor.  You must apply for the Patient Assistance Program to qualify for this discounted rate.  To apply, please call [employer] at [phone], select option 4, select option 2, ask to apply for  Patient Assistance Program. Meredeth Ide will ask your household income, and how many people  are in your household. They will quote your out-of-pocket cost based on that information.  [employer] will also be able to set up a 55-month, interest-free  payment plan if needed.  Applying the monitor   Shave hair from upper left chest.  Hold abrader disc by orange tab. Rub abrader in 40 strokes over the upper left chest as  indicated in your monitor instructions.  Clean area with 4 enclosed alcohol pads. Let dry.  Apply patch as indicated in monitor instructions. Patch will be placed under collarbone on left  side of chest with arrow pointing upward.  Rub patch adhesive wings for 2 minutes. Remove white label marked "1". Remove the white  label marked "2". Rub patch adhesive wings for 2 additional minutes.  While looking in a mirror, press and release button in center of patch. A small green light will  flash 3-4 times. This will be your only indicator that the monitor has been turned on.  Do not shower for the first 24 hours. You may shower after the first 24 hours.  Press the button if you feel a symptom. You will hear a small click. Record Date, Time and  Symptom in the Patient Logbook.  When you are ready to remove the patch, follow instructions on the last 2 pages of Patient  Logbook. Stick patch monitor onto the last page of Patient Logbook.  Place Patient Logbook in the blue and white box. Use locking tab on box and tape box closed  securely. The blue and white box has prepaid postage on it. Please place it in the mailbox as  soon as possible. Your physician should have your test results approximately 7 days after the  monitor has been  mailed back to [facility].  Call [facility] Customer Care at [phone] if you have questions regarding  your ZIO XT patch monitor. Call them immediately if you see an orange light blinking on your  monitor.  If your monitor falls off in less than 4 days, contact our Monitor department at [phone].  If your monitor becomes loose or falls off after 4 days call [employer] at [phone] for  suggestions on securing your monitor    Follow-Up: At [facility], you and your  health needs are our priority.  As part of our continuing mission to provide you with exceptional heart care, we have created designated Provider Care Teams.  These Care Teams include your primary Cardiologist (physician) and Advanced Practice Providers (APPs -  Physician Assistants and Nurse Practitioners) who all work together to provide you with the care you need, when you need it.  We recommend signing up for the patient portal called "MyChart".  Sign up information is provided on this After Visit Summary.  MyChart is used to connect with patients for Virtual Visits (Telemedicine).  Patients are able to view lab/test results, encounter notes, upcoming appointments, etc.  Non-urgent messages can be sent to your provider as well.   To learn more about what you can do with MyChart, go to [email or web address].    Your next appointment:   1 month(s)  Provider:   Robin Searing, NP       Other Instructions Limit your salt intake to 1500-2000mg  per day or 500mg  of Sodium per meal. Check your blood pressure daily for 2 weeks, then contact the office with your readings.  Make sure to check 2 hours after your medications.   AVOID these things for 30 minutes before checking your blood pressure: No Drinking caffeine. No Drinking alcohol. No Eating. No Smoking. No Exercising.  Five minutes before checking your blood pressure: Pee. Sit in a dining chair. Avoid sitting in a soft couch or armchair. Be quiet. Do not talk.

## 2023-05-28 ENCOUNTER — Telehealth: Payer: Self-pay | Admitting: Nurse Practitioner

## 2023-05-28 MED ORDER — HYDRALAZINE HCL 100 MG PO TABS: 100.0000 mg | ORAL_TABLET | Freq: Three times a day (TID) | ORAL | 3 refills | Status: AC

## 2023-05-28 NOTE — Telephone Encounter (Signed)
Pt c/o medication issue:  1. Name of Medication: hydrALAZINE (APRESOLINE) 100 MG tablet   2. How are you currently taking this medication (dosage and times per day)? 1 tablet daily   3. Are you having a reaction (difficulty breathing--STAT)? No   4. What is your medication issue? Patient states Alden Server advised her at her appt he was increasing this medication to 100 MG 3x a day, but the bottle only states once a day.   Please advise.

## 2023-05-28 NOTE — Telephone Encounter (Signed)
2.HFpEF: -EF of 65-70%, no RWMA, mild LVH, grade 1 DD, with normal valve function. -Patient is euvolemic today -Continue Aldactone 25 mg daily, hydralazine 100 mg 3 times daily and losartan 100 mg daily -Low sodium diet, fluid restriction <2L, and daily weights encouraged. Educated to contact our office for weight gain of 2 lbs overnight or 5 lbs in one week.   See above note from patient's visit with Alden Server yesterday. Will send in new prescription. Patient is aware.

## 2023-06-08 DIAGNOSIS — M5416 Radiculopathy, lumbar region: Secondary | ICD-10-CM | POA: Diagnosis not present

## 2023-06-08 DIAGNOSIS — M545 Low back pain, unspecified: Secondary | ICD-10-CM | POA: Diagnosis not present

## 2023-06-12 ENCOUNTER — Other Ambulatory Visit: Payer: Self-pay

## 2023-06-12 DIAGNOSIS — I471 Supraventricular tachycardia, unspecified: Secondary | ICD-10-CM | POA: Diagnosis not present

## 2023-06-12 DIAGNOSIS — I2510 Atherosclerotic heart disease of native coronary artery without angina pectoris: Secondary | ICD-10-CM | POA: Diagnosis not present

## 2023-06-12 NOTE — Progress Notes (Signed)
   Hailey Barnes [birth year] [id number]  Patient outreached by Erasmo Leventhal , PharmD Candidate on 06/12/2023.  Blood Pressure Readings: Last documented ambulatory systolic blood pressure: 161 Last documented ambulatory diastolic blood pressure: 95 Does the patient have a validated home blood pressure machine?: Yes   They report home readings that are quite elevated as well. Pt also reported symptoms such as some headaches and chest pain. She mentioned she has pain in her back that may be contributing to her high blood pressure.  Medication review was performed. Is the patient taking their medications as prescribed?: Yes  The following barriers to adherence were noted: Does the patient have cost concerns?: No Does the patient have transportation concerns?: No Does the patient need assistance obtaining refills?: No Does the patient occassionally forget to take some of their prescribed medications?: No Does the patient feel like one/some of their medications make them feel poorly?: No Does the patient have questions or concerns about their medications?: No Does the patient have a follow up scheduled with their primary care provider/cardiologist?: Yes   Interventions: Interventions Completed: Medications were reviewed, Patient was counseled on lifestyle modifications to improve blood pressure, including, Patient was educated on proper technique to check home blood pressure and reminded to bring home machine and readings to next provider appointment  The patient has follow up scheduled:  PCP: Jerre Simon, MD   Erasmo Leventhal, Student-PharmD

## 2023-06-16 ENCOUNTER — Other Ambulatory Visit: Payer: Self-pay

## 2023-06-16 ENCOUNTER — Ambulatory Visit (INDEPENDENT_AMBULATORY_CARE_PROVIDER_SITE_OTHER): Payer: 59 | Admitting: Pharmacist

## 2023-06-16 ENCOUNTER — Ambulatory Visit (INDEPENDENT_AMBULATORY_CARE_PROVIDER_SITE_OTHER): Payer: 59 | Admitting: Student

## 2023-06-16 ENCOUNTER — Encounter: Payer: Self-pay | Admitting: Student

## 2023-06-16 ENCOUNTER — Other Ambulatory Visit: Payer: Self-pay | Admitting: Student

## 2023-06-16 VITALS — BP 190/97 | HR 79

## 2023-06-16 VITALS — BP 135/82 | HR 88 | Ht 62.0 in | Wt 223.2 lb

## 2023-06-16 DIAGNOSIS — I10 Essential (primary) hypertension: Secondary | ICD-10-CM

## 2023-06-16 DIAGNOSIS — N950 Postmenopausal bleeding: Secondary | ICD-10-CM | POA: Diagnosis not present

## 2023-06-16 NOTE — Assessment & Plan Note (Signed)
Recent postmenopausal bleed for which TVUS was obtained which showed no abnormalities.  Patient has not had any vaginal bleeding since then however given normal ultrasound we will send patient to colposcopy clinic to consider endometrial biopsy.  Discussed plan with patient and she was agreeable. -Scheduled patient with colposcopy clinic. -Discussed her TVUS findings.

## 2023-06-16 NOTE — Patient Instructions (Signed)
It was nice to see you today!  Your goal blood pressure is <130/80   140/90 mmHg.  Medication Changes:  Bring all of your medications to your follow up appointment.  Continue all medication the same.   Monitor blood pressure at home daily and keep a log (on your phone or piece of paper) to bring with you to your next visit. Write down date, time, blood pressure and pulse.  Keep up the good work with diet and exercise. Aim for a diet full of vegetables, fruit and lean meats (chicken, [place], fish). Try to limit salt intake by eating fresh or frozen vegetables (instead of canned), rinse canned vegetables prior to cooking and do not add any additional salt to meals.

## 2023-06-16 NOTE — Patient Instructions (Addendum)
It was wonderful to see you today. Thank you for allowing me to be a part of your care. Below is a short summary of what we discussed at your visit today:  Your blood pressure today was normal 135/82.  I suspect you either your home BP cuff might be faulty or your back pain could be attributing to your pain.  Continue medication and I will have you see our pharmacy team today.  Also your other transvaginal ultrasound for your postmenopausal bleeding did not show anything.  So I will schedule you with our colposcopy clinic on 9/12 we will likely get an endometrial biopsy and for further assessment of your bleeding.   If you have any questions or concerns, please do not hesitate to contact us via phone or MyChart message.   Jerre Simon, MD [facility]

## 2023-06-16 NOTE — Assessment & Plan Note (Addendum)
Blood pressure reading today was appropriate.  However patient's home BP readings are still elevated.  Her elevated blood pressure could be attributed to her anxiousness concerning her overall health and chronic back pain.  She has an appointment with orthopedic tomorrow for epidural shot hopeful this will help relieve some of her pain and eventually help improve her blood pressure.  Endorses good compliance and tolerance to her BP meds.  Patient will benefit from a 24-hour ambulatory blood pressure monitoring.  Will refer patient to pharmacy team, spoke to the Dr. Raymondo Band who agreed to see patient today.  Look out for Dr. Macky Lower visit notes. -Continue blood pressure readings -Refer patient to pharmacy team, Dr. Raymondo Band -Pending 24-hour ambulatory blood pressure will continue discussing options including management of her overall anxiousness.  Will discuss counseling as an option and as needed medications.

## 2023-06-16 NOTE — Progress Notes (Signed)
S:     Chief Complaint  Patient presents with   Medication Management    Hypertension   62 y.o. female who presents for hypertension evaluation, education, and management.  PMH is significant for Tobacco Cessation which I have worked with her prior.  Patient was referred and last seen by Primary Care Provider, Dr. Elliot Gurney, on 06/16/23. Office reading at PCP was normal, but home BP readings range around 150-160/80-90s.   Today, patient arrives in good spirits and presents without assistance. Denies dizziness, headache, blurred vision, swelling.   Medication adherence is good. Patient has taken BP medications today.   Current antihypertensives include: amlodipine 2.5 mg once daily, losartan 100 mg once daily, pindolol 10 mg 2 tablets twice daily, hydralazine 100 mg three times daily, spironolactone 25 mg once daily  Antihypertensives tried in the past include: lisinopril 40 mg, metoprolol 50 mg, nifedipine ER 30 mg  Insurance: Medicare   O:  Review of Systems  All other systems reviewed and are negative.   Physical Exam Constitutional:      Appearance: Normal appearance.  Neurological:     Mental Status: She is alert.  Psychiatric:        Mood and Affect: Mood normal.        Behavior: Behavior normal.        Thought Content: Thought content normal.        Judgment: Judgment normal.     Last 3 Office BP readings: BP Readings from Last 3 Encounters:  06/16/23 (!) 170/104  06/16/23 135/82  05/27/23 (!) 170/98    BMET    Component Value Date/Time   NA 143 03/30/2023 1728   K 4.7 03/30/2023 1728   CL 105 03/30/2023 1728   CO2 27 03/30/2023 1728   GLUCOSE 87 03/30/2023 1728   GLUCOSE 116 (H) 11/07/2022 0340   BUN 14 03/30/2023 1728   CREATININE 0.88 03/30/2023 1728   CREATININE 1.21 (H) 12/08/2016 1538   CALCIUM 9.2 03/30/2023 1728   GFRNONAA >60 11/07/2022 0340   GFRNONAA 51 (L) 12/08/2016 1538   GFRAA 58 (L) 07/31/2020 1457   GFRAA 59 (L) 12/08/2016 1538     Renal function: CrCl cannot be calculated (Patient's most recent lab result is older than the maximum 21 days allowed.).  Clinical ASCVD: Yes  The ASCVD Risk score (Arnett DK, et al., 2019) failed to calculate for the following reasons:   The patient has a prior MI or stroke diagnosis     A/P: Hypertension longstanding and uncontrolled on current medications with office readings of 170/104 and 190/97 mm Hg. BP goal < 130/80 mmHg. Referred by Dr. Elliot Gurney as office reading was normal (135/82 mm Hg), but home BP readings are significantly higher. Medication adherence appears optimal.  - Continued amlodipine 2.5 mg once daily - Continued losartan 100 mg once daily - Continued pindolol 10 mg 2 tablets twice daily - Continued hydralazine 100 mg three times daily - Continued spironolactone 25 mg once daily -Patient educated on purpose, proper use, and potential adverse effects. -Counseled on lifestyle modifications for blood pressure control including reduced dietary sodium, increased exercise, adequate sleep. -Encouraged patient to check BP at home and bring log of readings to next visit. Counseled on proper use of home BP cuff.   At follow up, plan ambulatory BP monitoring to see if patient has masked HTN.  Results reviewed and written information provided.    Written patient instructions provided. Patient verbalized understanding of treatment plan.  Total time in  face to face counseling 14 minutes.    Follow-up:  Pharmacist visit with Dr. Raymondo Band on 06/23/23. PCP clinic visit in [place].  Patient seen with Andee Poles, PharmD Candidate.

## 2023-06-16 NOTE — Progress Notes (Signed)
    SUBJECTIVE:   CHIEF COMPLAINT / HPI:   Patient is a 62 year old female presenting today for blood pressure follow-up. Currently on hydralazine 2G 3 times daily, amlodipine 2.5 mg and losartan 100 mg daily Recently reduced her dose down to 25 mg daily by cardiology Home readings are mostly 140-160s/90s-110s Patient denies any headache, chest pain or shortness of breath She endorses feeling anxious due to her overall health and also concern her back pain could be attributing to her elevated blood pressure.  PERTINENT  PMH / PSH: Reviewed  OBJECTIVE:   BP 135/82   Pulse 88   Ht 5\' 2"  (1.575 m)   Wt 223 lb 3.2 oz (101.2 kg)   LMP 04/06/2017   SpO2 97%   BMI 40.82 kg/m    Physical Exam General: Tearful, well appearing, NAD Cardiovascular: RRR, No Murmurs, Normal S2/S2 Respiratory: CTAB, No wheezing or Rales Abdomen: No distension or tenderness Extremities: No edema on extremities    ASSESSMENT/PLAN:   Hypertension Blood pressure reading today was appropriate.  However patient's home BP readings are still elevated.  Her elevated blood pressure could be attributed to her anxiousness concerning her overall health and chronic back pain.  She has an appointment with orthopedic tomorrow for epidural shot hopeful this will help relieve some of her pain and eventually help improve her blood pressure.  Endorses good compliance and tolerance to her BP meds.  Patient will benefit from a 24-hour ambulatory blood pressure monitoring.  Will refer patient to pharmacy team, spoke to the Dr. Raymondo Band who agreed to see patient today.  Look out for Dr. Macky Lower visit notes. -Continue blood pressure readings -Refer patient to pharmacy team, Dr. Raymondo Band -Pending 24-hour ambulatory blood pressure will continue discussing options including management of her overall anxiousness.  Will discuss counseling as an option and as needed medications.  Postmenopausal bleeding Recent postmenopausal bleed for which  TVUS was obtained which showed no abnormalities.  Patient has not had any vaginal bleeding since then however given normal ultrasound we will send patient to colposcopy clinic to consider endometrial biopsy.  Discussed plan with patient and she was agreeable. -Scheduled patient with colposcopy clinic. -Discussed her TVUS findings.    Vaginal ultrasound was unremarkable.  Given postmenopausal 62 year old with postmenopausal bleeding we will refer to pain clinic OB team for endometrial biopsy.   Jerre Simon, MD [facility]

## 2023-06-16 NOTE — Assessment & Plan Note (Signed)
Hypertension longstanding and uncontrolled on current medications with office readings of 170/104 and 190/97 mm Hg. BP goal < 130/80 mmHg. Referred by Dr. Elliot Gurney as office reading was normal (135/82 mm Hg), but home BP readings are significantly higher. Medication adherence appears optimal.  - Continued amlodipine 2.5 mg once daily - Continued losartan 100 mg once daily - Continued pindolol 10 mg 2 tablets twice daily - Continued hydralazine 100 mg three times daily - Continued spironolactone 25 mg once daily -Patient educated on purpose, proper use, and potential adverse effects. -Counseled on lifestyle modifications for blood pressure control including reduced dietary sodium, increased exercise, adequate sleep. -Encouraged patient to check BP at home and bring log of readings to next visit. Counseled on proper use of home BP cuff.   At follow up, plan ambulatory BP monitoring to see if patient has masked HTN.

## 2023-06-17 ENCOUNTER — Other Ambulatory Visit: Payer: Self-pay

## 2023-06-17 DIAGNOSIS — M5416 Radiculopathy, lumbar region: Secondary | ICD-10-CM | POA: Diagnosis not present

## 2023-06-17 NOTE — Progress Notes (Signed)
Reviewed and agree with Dr Koval's plan.   

## 2023-06-18 ENCOUNTER — Other Ambulatory Visit: Payer: Self-pay | Admitting: Nurse Practitioner

## 2023-06-18 MED ORDER — TRIAMCINOLONE ACETONIDE 0.025 % EX OINT: 1.0000 | TOPICAL_OINTMENT | Freq: Two times a day (BID) | CUTANEOUS | 0 refills | Status: DC | PRN

## 2023-06-23 ENCOUNTER — Ambulatory Visit (INDEPENDENT_AMBULATORY_CARE_PROVIDER_SITE_OTHER): Payer: 59 | Admitting: Pharmacist

## 2023-06-23 ENCOUNTER — Encounter: Payer: Self-pay | Admitting: Pharmacist

## 2023-06-23 VITALS — BP 189/113 | Wt 228.4 lb

## 2023-06-23 DIAGNOSIS — I1A0 Resistant hypertension: Secondary | ICD-10-CM

## 2023-06-23 DIAGNOSIS — E790 Hyperuricemia without signs of inflammatory arthritis and tophaceous disease: Secondary | ICD-10-CM

## 2023-06-23 MED ORDER — TRIAMCINOLONE ACETONIDE 0.025 % EX OINT: 1.0000 | TOPICAL_OINTMENT | Freq: Two times a day (BID) | CUTANEOUS | 0 refills | Status: DC | PRN

## 2023-06-23 MED ORDER — ALLOPURINOL 300 MG PO TABS: 300.0000 mg | ORAL_TABLET | Freq: Every day | ORAL | 1 refills | Status: DC

## 2023-06-23 NOTE — Progress Notes (Signed)
   S:     Chief Complaint  Patient presents with   Medication Management    Amb BP Monitor Day #1   62 y.o. female who presents for hypertension evaluation, education, and management. Patient arrives in good spirits and presents with need for assistance as she reports increased dizziness recently.     PMH is significant for Tobacco Use/ COPD, Diabetes and CHF.  Patient was referred and last seen by Primary Care Provider, Dr. Elliot Gurney, on 06/16/2023.   Diagnosed with Hypertension in 2011.  Medication compliance is reported to be good.  Discussed procedure for wearing the monitor and gave patient written instructions. Monitor was placed on non-dominant arm with instructions to return in the morning.   Current BP Medications include:  amlodipine 2.5 mg once daily,  - losartan 100 mg once daily - pindolol 10 mg 2 tablets twice daily - hydralazine 100 mg three times daily - spironolactone 25 mg once daily - torsemide 20mg  daily  Dietary habits include:  Strives for high plant diet.  O:  Review of Systems  Musculoskeletal:  Positive for back pain.  Neurological:  Positive for dizziness.  All other systems reviewed and are negative.   Physical Exam Vitals reviewed.  Constitutional:      Appearance: Normal appearance.  Pulmonary:     Effort: Pulmonary effort is normal.  Musculoskeletal:     Right lower leg: No edema.     Left lower leg: No edema.  Neurological:     Mental Status: She is alert.  Psychiatric:        Mood and Affect: Mood normal.        Behavior: Behavior normal.        Thought Content: Thought content normal.        Judgment: Judgment normal.    Last 3 Office BP readings: BP Readings from Last 3 Encounters:  06/23/23 (!) 189/113  06/16/23 (!) 190/97  06/16/23 135/82    Basic Metabolic Panel    Component Value Date/Time   NA 143 03/30/2023 1728   K 4.7 03/30/2023 1728   CL 105 03/30/2023 1728   CO2 27 03/30/2023 1728   GLUCOSE 87 03/30/2023 1728    GLUCOSE 116 (H) 11/07/2022 0340   BUN 14 03/30/2023 1728   CREATININE 0.88 03/30/2023 1728   CREATININE 1.21 (H) 12/08/2016 1538   CALCIUM 9.2 03/30/2023 1728   GFRNONAA >60 11/07/2022 0340   GFRNONAA 51 (L) 12/08/2016 1538   GFRAA 58 (L) 07/31/2020 1457   GFRAA 59 (L) 12/08/2016 1538    Renal function: CrCl cannot be calculated (Patient's most recent lab result is older than the maximum 21 days allowed.).   ABPM Study Data: Arm Placement left arm  For Office Goal BP of <140/90 mmHg: Lower if tolerated. ABPM thresholds: Overall BP <130/80 mmHg, daytime BP <135/85 mmHg, sleeptime BP <120/70 mmHg   A/P: Personal and family history of hypertension.  Diagnosed > 10 years ago and has resistant hypertension. Current short-term goal pressure of <140/90 and lower is tolerated.     -Placed blood pressure cuff, provided education, patient instructed to wear cuff for 24 hours and return tomorrow to review results.  Long-term medications: Patient requested refills for allopurinol and triamcinolone were provided.   Written patient instructions provided including activity/symptom/event log. Patient verbalized understanding of plan. Total time in face to face counseling 19 minutes.    Follow-up: Tomorrow AM - early morning appointment 9:00  Patient seen with Alesia Banda, PharmD Candidate.

## 2023-06-23 NOTE — Patient Instructions (Signed)
 Blood Pressure Activity Diary Time Lying down/ Sleeping Walking/ Exercise Stressed/ Angry Headache/ Pain Dizzy  9 AM       10 AM       11 AM       12 PM       1 PM       2 PM       Time Lying down/ Sleeping Walking/ Exercise Stressed/ Angry Headache/ Pain Dizzy  3 PM       4 PM        5 PM       6 PM       7 PM       8 PM       Time Lying down/ Sleeping Walking/ Exercise Stressed/ Angry Headache/ Pain Dizzy  9 PM       10 PM       11 PM       12 AM       1 AM       2 AM       3 AM       Time Lying down/ Sleeping Walking/ Exercise Stressed/ Angry Headache/ Pain Dizzy  4 AM       5 AM       6 AM       7 AM       8 AM       9 AM       10 AM        Time you woke up: _________                  Time you went to sleep:__________  Come back tomorrow at 8:30 to have the monitor removed  Call the [facility] if you have any questions before then ([phone])  Wearing the Blood Pressure Monitor The cuff will inflate every 20 minutes during the day and every 30 minutes while you sleep. Fill out the blood pressure-activity diary during the day, especially during activities that may affect your reading -- such as exercise, stress, walking, taking your blood pressure medications  Important things to know: Avoid taking the monitor off for the next 24 hours, unless it causes you discomfort or pain. Do NOT get the monitor wet and do NOT try to clean the monitor with any cleaning products. Do NOT put the monitor on anyone else's arm. When the cuff inflates, avoid excess movement. Let the cuffed arm hang loosely, slightly away from the body. Avoid flexing the muscles or moving the hand/fingers. Remember to fill out the blood pressure activity diary. If you experience severe pain or unusual pain (not associated with getting your blood pressure checked), remove the monitor.  Troubleshooting:  Code  Troubleshooting   1  Check cuff position, tighten cuff   2, 3  Remain still  during reading   4, 87  Check air hose connections and make sure cuff is tight   85, 89  Check hose connections and make tubing is not crimped   86  Push START/STOP to restart reading   88, 91  Retry by pushing START/STOP   90  Replace batteries. If problem persists, remove monitor and bring back to   clinic at follow up   97, 98, 99  Service required - Remove monitor and bring back to clinic at follow up

## 2023-06-23 NOTE — Assessment & Plan Note (Signed)
Personal and family history of hypertension.  Diagnosed > 10 years ago and has resistant hypertension. Current short-term goal pressure of <140/90 and lower is tolerated.     -Placed blood pressure cuff, provided education, patient instructed to wear cuff for 24 hours and return tomorrow to review results.

## 2023-06-24 ENCOUNTER — Ambulatory Visit (INDEPENDENT_AMBULATORY_CARE_PROVIDER_SITE_OTHER): Payer: 59 | Admitting: Pharmacist

## 2023-06-24 ENCOUNTER — Encounter: Payer: Self-pay | Admitting: Pharmacist

## 2023-06-24 VITALS — Wt 228.4 lb

## 2023-06-24 DIAGNOSIS — F17200 Nicotine dependence, unspecified, uncomplicated: Secondary | ICD-10-CM | POA: Diagnosis not present

## 2023-06-24 DIAGNOSIS — I1A0 Resistant hypertension: Secondary | ICD-10-CM | POA: Diagnosis not present

## 2023-06-24 NOTE — Assessment & Plan Note (Signed)
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  Confidence 0/10 to quit smoking.She is willing to work on intake reduction and set a personal goal of reducing intake of smoking by 5 cigarettes per day by the end of October.  - Reassess progress toward goal at next visit.

## 2023-06-24 NOTE — Assessment & Plan Note (Signed)
History of hypertension, longstanding since 2011, currently taking multiple drug therapy. Goal presssure of <140/90 and lower if tolerated.  Found to have persistently elevated and uncontrolled blood pressure with 24-hour ambulatory blood pressure evaluation which demonstrates an average AWAKE blood pressure of 164/103 mmHg. Nocturnal dipping pattern is normal.   Changes to medications -Increased dose of spironolactone from 25mg  to 50mg  daily.  -emphasized importance of taking all medications including evening/PM doses.

## 2023-06-24 NOTE — Progress Notes (Signed)
Reviewed and agree with Dr Koval's plan.   

## 2023-06-24 NOTE — Patient Instructions (Addendum)
It was nice to see you today!  Your goal blood pressure is <140/90 mmHg.  Medication Changes:  Increase spironolactone to 2 tablets once daily.  Continue all other medication the same.   Monitor blood pressure at home daily and keep a log (on your phone or piece of paper) to bring with you to your next visit. Write down date, time, blood pressure and pulse.  Keep up the good work with diet and exercise. Aim for a diet full of vegetables, fruit and lean meats (chicken, [place], fish). Try to limit salt intake by eating fresh or frozen vegetables (instead of canned), rinse canned vegetables prior to cooking and do not add any additional salt to meals.

## 2023-06-24 NOTE — Progress Notes (Signed)
S:     Chief Complaint  Patient presents with   Medication Management    Amb BP - Day 2   62 y.o. female who presents for hypertension evaluation, education, and management.  Patient arrives in good spirits and presents without any assistance.   PMH is significant for Tobacco Use/ COPD, Diabetes and CHF.  Patient returns to clinic with 24 hour blood pressure monitor. Patient reports they were able to wear the Ambulatory Blood Pressure Cuff for the entire 24 evaluation period.   Patient admits stopping taking night medications.  O:  Review of Systems  Musculoskeletal:  Positive for back pain.  All other systems reviewed and are negative.   Physical Exam Constitutional:      Appearance: Normal appearance.  Pulmonary:     Effort: Pulmonary effort is normal.  Neurological:     Mental Status: She is alert.  Psychiatric:        Mood and Affect: Mood normal.        Behavior: Behavior normal.        Thought Content: Thought content normal.        Judgment: Judgment normal.     Last 3 Office BP readings: BP Readings from Last 3 Encounters:  06/23/23 (!) 189/113  06/16/23 (!) 190/97  06/16/23 135/82    Clinical Atherosclerotic Cardiovascular Disease (ASCVD): Yes  The ASCVD Risk score (Arnett DK, et al., 2019) failed to calculate for the following reasons:   The patient has a prior MI or stroke diagnosis  Basic Metabolic Panel    Component Value Date/Time   NA 143 03/30/2023 1728   K 4.7 03/30/2023 1728   CL 105 03/30/2023 1728   CO2 27 03/30/2023 1728   GLUCOSE 87 03/30/2023 1728   GLUCOSE 116 (H) 11/07/2022 0340   BUN 14 03/30/2023 1728   CREATININE 0.88 03/30/2023 1728   CREATININE 1.21 (H) 12/08/2016 1538   CALCIUM 9.2 03/30/2023 1728   GFRNONAA >60 11/07/2022 0340   GFRNONAA 51 (L) 12/08/2016 1538   GFRAA 58 (L) 07/31/2020 1457   GFRAA 59 (L) 12/08/2016 1538    Renal function: CrCl cannot be calculated (Patient's most recent lab result is older than  the maximum 21 days allowed.).   ABPM Study Data: Arm Placement left arm  Overall Mean 24hr BP:   159/98 mmHg   HR: 80  Daytime Mean BP:  164/103 mmHg  HR: 80  Nighttime Mean BP:  150/90 mmHg   HR: 79  Dipping Pattern: Yes.    Sys:   8.3%   Dia: 11.9%   [normal dipping ~10-20%]   For Office Goal BP of <140/90 mmHg - lower if tolerated: ABPM thresholds: Overall BP <130/80 mmHg, daytime BP <135/85 mmHg, sleeptime BP <120/70 mmHg    A/P: History of hypertension, longstanding since 2011, currently taking multiple drug therapy. Goal presssure of <140/90 and lower if tolerated.  Found to have persistently elevated and uncontrolled blood pressure with 24-hour ambulatory blood pressure evaluation which demonstrates an average AWAKE blood pressure of 164/103 mmHg. Nocturnal dipping pattern is normal.   Changes to medications -Increased dose of spironolactone from 25mg  to 50mg  daily.  BMET at follow-up.  -emphasized importance of taking all medications including evening/PM doses.   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  Confidence 0/10 to quit smoking.She is willing to work on intake reduction and set a personal goal of reducing intake of smoking  by 5 cigarettes per day by the end of October.  - Reassess progress toward goal at next visit.   Results reviewed and written information provided.    Written patient instructions provided. Patient verbalized understanding of treatment plan.  Total time in face to face counseling 37 minutes.    Follow-up:  Pharmacist 07/02/2023 prior to previously scheduled Colposcopy visit.  PCP clinic visit in 1-2 months.  Patient seen with Alesia Banda, PharmD Candidate and Andee Poles, PharmD Candidate.

## 2023-06-25 NOTE — Progress Notes (Signed)
Reviewed and agree with Dr Koval's plan.   

## 2023-06-29 DIAGNOSIS — G5603 Carpal tunnel syndrome, bilateral upper limbs: Secondary | ICD-10-CM | POA: Diagnosis not present

## 2023-06-29 DIAGNOSIS — M13842 Other specified arthritis, left hand: Secondary | ICD-10-CM | POA: Diagnosis not present

## 2023-07-02 ENCOUNTER — Ambulatory Visit (INDEPENDENT_AMBULATORY_CARE_PROVIDER_SITE_OTHER): Payer: 59 | Admitting: Family Medicine

## 2023-07-02 ENCOUNTER — Encounter: Payer: Self-pay | Admitting: Pharmacist

## 2023-07-02 ENCOUNTER — Ambulatory Visit (INDEPENDENT_AMBULATORY_CARE_PROVIDER_SITE_OTHER): Payer: 59 | Admitting: Pharmacist

## 2023-07-02 VITALS — BP 124/80 | HR 77 | Wt 221.0 lb

## 2023-07-02 VITALS — BP 119/82 | HR 72 | Ht 63.5 in | Wt 222.2 lb

## 2023-07-02 DIAGNOSIS — I1A0 Resistant hypertension: Secondary | ICD-10-CM | POA: Diagnosis not present

## 2023-07-02 DIAGNOSIS — F17200 Nicotine dependence, unspecified, uncomplicated: Secondary | ICD-10-CM | POA: Diagnosis not present

## 2023-07-02 DIAGNOSIS — N950 Postmenopausal bleeding: Secondary | ICD-10-CM

## 2023-07-02 NOTE — Progress Notes (Signed)
Prior hx of fibroids Prior hx endometrial biopsy in 2018 Novasure ablation after that  PUS: 05/29/2023  Uterus  Measurements: 7.4 x 4.0 x 4.4 cm = volume: 68 mL. The uterus is retroverted. The cervix is unremarkable. No intrauterine masses are seen.  Endometrium  Thickness: 4 mm.  No focal abnormality visualized.  Right ovary  The right ovary is not clearly identified. A tubular fluid-filled structure seen within the right adnexa, best appreciated on cine sequence # 4 and # 5 compatible with a right hydrosalpinx. No solid adnexal masses identified.  Left ovary  Measurements: 2.7 x 1.6 x 1.2 cm = volume: 3 mL. Normal appearance/no adnexal mass.  Other findings:  Trace free fluid is seen within the pelvis  IMPRESSION: 1. Normal sonographic appearance of the uterus and endometrium. 2. Nonvisualization of the right ovary. 3. Right hydrosalpinx, similar appearance to CT examination 11/07/2022. 4. Trace free fluid within the pelvis, nonspecific.    PROCEDURE NOTE: Endometrial Biopsy Patient given informed consent, signed copy in the chart.  Appropriate time out taken. . The patient was placed in the lithotomy position and the cervix brought into view with sterile speculum.  The portio of cervix was cleansed x 3 with betadine swabs.  A tenaculum was placed in the anterior lip of the cervix.  A uterine sound was used to measure the uterus..   A pipelle was introduced  into the uterus, suction created,  then patient started experiencing a lot of discomfort and asked for discontinuation of procedure; the procedure was immediately discontinued. All equipment removed. No bleeding noted. No sample was obtained  Patient's discomfort resolved after she rested for a few moments and she was released to home.   As procedure was discontinued, I will send referral to GYN for further eval and management.

## 2023-07-02 NOTE — Progress Notes (Addendum)
S:     Chief Complaint  Patient presents with   Medication Management    Hypertension   62 y.o. female who presents for hypertension evaluation, education, and management.  PMH is significant for Tobacco Use/ COPD, Diabetes and CHF.  Patient was referred and last seen by Primary Care Provider, Dr. Elliot Gurney, on 06/16/23.   At last visit, increased dose of spironolactone from 25 to 50 mg daily.   Today, patient arrives in good spirits and presents without any assistance. Denies dizziness, headache, blurred vision, swelling.   Patient reports hypertension was diagnosed ~ age of 34.   Medication adherence appears good . Patient has taken BP medications today.   Current antihypertensives include: amlodipine 2.5 mg once daily, losartan 100 mg once daily, pindolol 10 mg 2 tablets twice daily, spironolactone 50 mg once daily, torsemide 20 mg every other day  Antihypertensives tried in the past include: lisinopril 40 mg  Reported home BP readings: 138/93 mm Hg  O:  Review of Systems  All other systems reviewed and are negative.   Physical Exam Constitutional:      Appearance: Normal appearance.  Pulmonary:     Effort: Pulmonary effort is normal.  Neurological:     Mental Status: She is alert.  Psychiatric:        Mood and Affect: Mood normal.        Behavior: Behavior normal.        Thought Content: Thought content normal.        Judgment: Judgment normal.     Last 3 Office BP readings: BP Readings from Last 3 Encounters:  06/23/23 (!) 189/113  06/16/23 (!) 190/97  06/16/23 135/82    BMET    Component Value Date/Time   NA 143 03/30/2023 1728   K 4.7 03/30/2023 1728   CL 105 03/30/2023 1728   CO2 27 03/30/2023 1728   GLUCOSE 87 03/30/2023 1728   GLUCOSE 116 (H) 11/07/2022 0340   BUN 14 03/30/2023 1728   CREATININE 0.88 03/30/2023 1728   CREATININE 1.21 (H) 12/08/2016 1538   CALCIUM 9.2 03/30/2023 1728   GFRNONAA >60 11/07/2022 0340   GFRNONAA 51 (L)  12/08/2016 1538   GFRAA 58 (L) 07/31/2020 1457   GFRAA 59 (L) 12/08/2016 1538    Renal function: CrCl cannot be calculated (Patient's most recent lab result is older than the maximum 21 days allowed.).  Clinical ASCVD: Yes  Known CHF  A/P: Hypertension longstanding, controlled on current medications with office reading of 119/82 mm Hg.  BP goal < 130/80 mmHg. Medication adherence appears good.  - Continued amlodipine 2.5 mg once daily. - Continued losartan 100 mg once daily. - Continued pindolol 10 mg 2 tablets twice daily. - Continued spironolactone 50 mg once daily. - Continued torsemide 20 mg every other day. -Patient educated on purpose, proper use, and potential adverse effects.  -F/u labs ordered - BMET. -Counseled on lifestyle modifications for blood pressure control including reduced dietary sodium, increased exercise, adequate sleep. -Encouraged patient to check BP at home and bring log of readings to next visit. Counseled on proper use of home BP cuff.     Tobacco Cessation: - Patient reports currently smoking 10 cigarettes a day.  - Encouraged patient to reach goal of 5 cigarettes a day by end of October. Patient reports confidence level 5/10 that she will achieve this goal. - Discussed considering starting NRT (patch) or possibly varenicline at next visit if patient needs additional assist intake reduction or quitting.  Results reviewed and written information provided.    Written patient instructions provided. Patient verbalized understanding of treatment plan.  Total time in face to face counseling 35 minutes.    Follow-up:  Pharmacist visit with Dr. Raymondo Band on 08/10/23 PCP clinic visit PRN.  Patient seen with Andee Poles, PharmD Candidate.    BMET - stable and normal values shared result via mychart message.

## 2023-07-02 NOTE — Patient Instructions (Addendum)
It was nice to see you today!  Your goal blood pressure is <130/80 mmHg.  Medication Changes:  Continue all medication the same.   Monitor blood pressure at home daily and keep a log (on your phone or piece of paper) to bring with you to your next visit. Write down date, time, blood pressure and pulse.  Keep up the good work with diet and exercise. Aim for a diet full of vegetables, fruit and lean meats (chicken, [place], fish). Try to limit salt intake by eating fresh or frozen vegetables (instead of canned), rinse canned vegetables prior to cooking and do not add any additional salt to meals.

## 2023-07-02 NOTE — Assessment & Plan Note (Signed)
Hypertension longstanding, controlled on current medications with office reading of 119/82 mm Hg.  BP goal < 130/80 mmHg. Medication adherence appears good.  - Continued amlodipine 2.5 mg once daily. - Continued losartan 100 mg once daily. - Continued pindolol 10 mg 2 tablets twice daily. - Continued spironolactone 50 mg once daily. - Continued torsemide 20 mg every other day. -Patient educated on purpose, proper use, and potential adverse effects.  -F/u labs ordered - BMET. -Counseled on lifestyle modifications for blood pressure control including reduced dietary sodium, increased exercise, adequate sleep. -Encouraged patient to check BP at home and bring log of readings to next visit. Counseled on proper use of home BP cuff.

## 2023-07-02 NOTE — Assessment & Plan Note (Signed)
-   Patient reports currently smoking 10 cigarettes a day.  - Encouraged patient to reach goal of 5 cigarettes a day by end of October. Patient reports confidence level 5/10 that she will achieve this goal. - Discussed considering starting NRT (patch) or possibly varenicline at next visit if patient needs additional assist intake reduction or quitting.

## 2023-07-02 NOTE — Patient Instructions (Addendum)
Thank you for coming in today!  You may have some bleeding and cramping today. You can take Tylenol or Ibuprofen as needed.  We will send a referral to gynecology to see if they can assist with your work up. If you don't hear from them in about 2 weeks, please let us know and we can help out.  Have a great day!

## 2023-07-03 ENCOUNTER — Encounter: Payer: Self-pay | Admitting: Pharmacist

## 2023-07-03 DIAGNOSIS — M533 Sacrococcygeal disorders, not elsewhere classified: Secondary | ICD-10-CM | POA: Diagnosis not present

## 2023-07-03 LAB — BASIC METABOLIC PANEL
BUN/Creatinine Ratio: 19 (ref 12–28)
BUN: 18 mg/dL (ref 8–27)
CO2: 24 mmol/L (ref 20–29)
Calcium: 9.7 mg/dL (ref 8.7–10.3)
Chloride: 104 mmol/L (ref 96–106)
Creatinine, Ser: 0.95 mg/dL (ref 0.57–1.00)
Glucose: 79 mg/dL (ref 70–99)
Potassium: 4.1 mmol/L (ref 3.5–5.2)
Sodium: 141 mmol/L (ref 134–144)
eGFR: 69 mL/min/{1.73_m2} (ref >59)

## 2023-07-03 NOTE — Progress Notes (Signed)
Reviewed and agree with Dr Koval's plan.   

## 2023-07-17 ENCOUNTER — Telehealth: Payer: Self-pay

## 2023-07-17 NOTE — Telephone Encounter (Signed)
Patient calls nurse line regarding referral to OBGYN. She was seen in clinic with Dr. Jennette Kettle on 07/02/23 where an endometrial biopsy was attempted, however, sample was not obtained due to patient discomfort.   I am unable to find a pending referral in chart review.   Will forward to Dr. Jennette Kettle to place new referral.   Veronda Prude, RN

## 2023-07-18 ENCOUNTER — Other Ambulatory Visit: Payer: Self-pay | Admitting: Family Medicine

## 2023-07-18 DIAGNOSIS — N950 Postmenopausal bleeding: Secondary | ICD-10-CM

## 2023-07-18 NOTE — Progress Notes (Unsigned)
Dear [facility] Team I have placed order for GYN referral again. Please let her know and tell her I am sorry it did not go through first time--I am sure I did not click something right as I do not see the order in there either. My fault and my apologies! I am glad she called and if she does not hear from them in 10- 14 days please let me know THANKS! Denny Levy

## 2023-07-20 ENCOUNTER — Telehealth: Payer: Self-pay

## 2023-07-20 NOTE — Telephone Encounter (Addendum)
Have called patient to alert her as to the current status of the referral.  Told patient that a message was left at the GYN office and that they should call her soon.  Patient is to call [facility] if she does not hear from them by the end of the week.  Patient is upset that she will possibly have to wait even more time.  Glennie Hawk, CMA

## 2023-07-29 DIAGNOSIS — M533 Sacrococcygeal disorders, not elsewhere classified: Secondary | ICD-10-CM | POA: Diagnosis not present

## 2023-08-10 ENCOUNTER — Ambulatory Visit: Payer: 59 | Admitting: Pharmacist

## 2023-08-10 ENCOUNTER — Telehealth: Payer: Self-pay | Admitting: Pharmacist

## 2023-08-10 NOTE — Telephone Encounter (Signed)
Attempted to contact patient for follow-up of missed appointment   Left HIPAA compliant voice mail requesting call back to office phone: [phone] for appointment with pharmacy  Total time with patient call and documentation of interaction: 4 minutes.

## 2023-09-07 DIAGNOSIS — G4733 Obstructive sleep apnea (adult) (pediatric): Secondary | ICD-10-CM | POA: Diagnosis not present

## 2023-09-08 ENCOUNTER — Ambulatory Visit (INDEPENDENT_AMBULATORY_CARE_PROVIDER_SITE_OTHER): Payer: 59 | Admitting: Obstetrics and Gynecology

## 2023-09-08 ENCOUNTER — Encounter: Payer: Self-pay | Admitting: Obstetrics and Gynecology

## 2023-09-08 VITALS — BP 110/80 | HR 74 | Ht 62.25 in | Wt 223.0 lb

## 2023-09-08 DIAGNOSIS — R309 Painful micturition, unspecified: Secondary | ICD-10-CM

## 2023-09-08 DIAGNOSIS — N950 Postmenopausal bleeding: Secondary | ICD-10-CM

## 2023-09-08 NOTE — Progress Notes (Signed)
Patient presents with second episode of postmenopausal bleeding. Her second episode happened 2 months ago and was not after intercourse and was brown spotting with wiping and then noticed it a few days later. Sh has had a history of STI's in the past. [place] with hydrosalpinx in August and lining of 4mm. Her PCP tried to do an EMB but this was painful. Her last pap smear was in 2022 and it was normal in epic She denies any abnormal pap smears. She does report persistent lower pelvic pain  Hysteroscopy 2018 removal of benign endometrial polyp Patient's last menstrual period was 04/06/2017.   Narrative & Impression  CLINICAL DATA:  Postmenopausal bleeding   EXAM: ULTRASOUND PELVIS TRANSVAGINAL   TECHNIQUE: Transvaginal ultrasound examination of the pelvis was performed including evaluation of the uterus, ovaries, adnexal regions, and pelvic cul-de-sac.   COMPARISON:  02/26/2017   FINDINGS: Uterus   Measurements: 7.4 x 4.0 x 4.4 cm = volume: 68 mL. The uterus is retroverted. The cervix is unremarkable. No intrauterine masses are seen.   Endometrium   Thickness: 4 mm.  No focal abnormality visualized.   Right ovary   The right ovary is not clearly identified. A tubular fluid-filled structure seen within the right adnexa, best appreciated on cine sequence # 4 and # 5 compatible with a right hydrosalpinx. No solid adnexal masses identified.   Left ovary   Measurements: 2.7 x 1.6 x 1.2 cm = volume: 3 mL. Normal appearance/no adnexal mass.   Other findings:  Trace free fluid is seen within the pelvis   IMPRESSION: 1. Normal sonographic appearance of the uterus and endometrium. 2. Nonvisualization of the right ovary. 3. Right hydrosalpinx, similar appearance to CT examination 11/07/2022. 4. Trace free fluid within the pelvis, nonspecific.      A/p PM bleeding  Counseled on the importance of the EMB.  She would like to try again with medication for the cramping.  She can  bring her daughter.  To repeat the [place] for any changes.  She will also need an annual exam. We also discussed options for repeat hysteroscopy in the OR or the [facility] for recurrent PMB.  She would like to try to stay conservative for treatment at this time.  30 minutes spent on reviewing records, imaging,  and one on one patient time and counseling patient and documentation Dr. Karma Greaser

## 2023-09-10 ENCOUNTER — Other Ambulatory Visit: Payer: Self-pay | Admitting: Nurse Practitioner

## 2023-09-10 LAB — URINALYSIS, COMPLETE W/RFL CULTURE
Bilirubin Urine: NEGATIVE
Glucose, UA: NEGATIVE
Hgb urine dipstick: NEGATIVE
Hyaline Cast: NONE SEEN /[LPF]
Ketones, ur: NEGATIVE
Nitrites, Initial: NEGATIVE
Protein, ur: NEGATIVE
RBC / HPF: NONE SEEN /[HPF] (ref 0–2)
Specific Gravity, Urine: 1.002 (ref 1.001–1.035)
pH: 6.5 (ref 5.0–8.0)

## 2023-09-10 LAB — CULTURE INDICATED

## 2023-09-10 LAB — URINE CULTURE
MICRO NUMBER:: 15750297
SPECIMEN QUALITY:: ADEQUATE

## 2023-09-15 ENCOUNTER — Telehealth: Payer: Self-pay | Admitting: Family Medicine

## 2023-09-15 DIAGNOSIS — M5416 Radiculopathy, lumbar region: Secondary | ICD-10-CM

## 2023-09-15 NOTE — Telephone Encounter (Signed)
Patient called asking if we could put an order in for another epidural injection?  Please advise.

## 2023-09-15 NOTE — Telephone Encounter (Signed)
Last ESI 09/18/22  Last OV 03/09/23.   Dr. Denyse Amass, OK to place order for repeat ESI?

## 2023-09-16 ENCOUNTER — Other Ambulatory Visit: Payer: Self-pay | Admitting: Student

## 2023-09-16 DIAGNOSIS — J449 Chronic obstructive pulmonary disease, unspecified: Secondary | ICD-10-CM

## 2023-09-21 ENCOUNTER — Other Ambulatory Visit: Payer: Self-pay | Admitting: Family Medicine

## 2023-09-21 DIAGNOSIS — H6991 Unspecified Eustachian tube disorder, right ear: Secondary | ICD-10-CM

## 2023-09-22 NOTE — Telephone Encounter (Signed)
Epidural steroid injection ordered 

## 2023-09-22 NOTE — Addendum Note (Signed)
Addended by: Rodolph Bong on: 09/22/2023 07:20 AM   Modules accepted: Orders

## 2023-09-23 NOTE — Telephone Encounter (Signed)
Called pt and advised that order has been placed for ESI. Pt verbalized understanding.

## 2023-09-27 ENCOUNTER — Other Ambulatory Visit: Payer: Self-pay | Admitting: Nurse Practitioner

## 2023-10-06 ENCOUNTER — Ambulatory Visit (INDEPENDENT_AMBULATORY_CARE_PROVIDER_SITE_OTHER): Payer: 59 | Admitting: Obstetrics and Gynecology

## 2023-10-06 ENCOUNTER — Encounter: Payer: Self-pay | Admitting: Obstetrics and Gynecology

## 2023-10-06 ENCOUNTER — Other Ambulatory Visit (HOSPITAL_COMMUNITY)
Admission: RE | Admit: 2023-10-06 | Discharge: 2023-10-06 | Disposition: A | Discharge status: Home / Self Care | Payer: 59 | Source: Ambulatory Visit | Attending: Obstetrics and Gynecology | Admitting: Obstetrics and Gynecology

## 2023-10-06 ENCOUNTER — Ambulatory Visit (INDEPENDENT_AMBULATORY_CARE_PROVIDER_SITE_OTHER): Payer: 59

## 2023-10-06 VITALS — BP 116/70 | HR 74

## 2023-10-06 DIAGNOSIS — N840 Polyp of corpus uteri: Secondary | ICD-10-CM | POA: Diagnosis present

## 2023-10-06 DIAGNOSIS — N7011 Chronic salpingitis: Secondary | ICD-10-CM | POA: Diagnosis present

## 2023-10-06 DIAGNOSIS — N950 Postmenopausal bleeding: Secondary | ICD-10-CM

## 2023-10-06 DIAGNOSIS — E2839 Other primary ovarian failure: Secondary | ICD-10-CM

## 2023-10-06 DIAGNOSIS — D219 Benign neoplasm of connective and other soft tissue, unspecified: Secondary | ICD-10-CM

## 2023-10-06 DIAGNOSIS — R102 Pelvic and perineal pain: Secondary | ICD-10-CM

## 2023-10-06 NOTE — Progress Notes (Signed)
Patient presents today for EMB and PUS today.  Patient presents with second episode of postmenopausal bleeding. Her second episode happened 2 months ago and was not after intercourse and was brown spotting with wiping and then noticed it a few days later. Sh has had a history of STI's in the past. [place] with hydrosalpinx in August and lining of 4mm. Her PCP tried to do an EMB but this was painful. Her last pap smear was in 2022 and it was normal in epic She denies any abnormal pap smears. She does report persistent lower pelvic pain on the right side  Hysteroscopy 2018 removal of benign endometrial polyp Patient's last menstrual period was 04/06/2017.   Narrative & Impression  CLINICAL DATA:  Postmenopausal bleeding   EXAM: ULTRASOUND PELVIS TRANSVAGINAL   TECHNIQUE: Transvaginal ultrasound examination of the pelvis was performed including evaluation of the uterus, ovaries, adnexal regions, and pelvic cul-de-sac.   COMPARISON:  02/26/2017   FINDINGS: Uterus   Measurements: 7.4 x 4.0 x 4.4 cm = volume: 68 mL. The uterus is retroverted. The cervix is unremarkable. No intrauterine masses are seen.   Endometrium   Thickness: 4 mm.  No focal abnormality visualized.   Right ovary   The right ovary is not clearly identified. A tubular fluid-filled structure seen within the right adnexa, best appreciated on cine sequence # 4 and # 5 compatible with a right hydrosalpinx. No solid adnexal masses identified.   Left ovary   Measurements: 2.7 x 1.6 x 1.2 cm = volume: 3 mL. Normal appearance/no adnexal mass.   Other findings:  Trace free fluid is seen within the pelvis   IMPRESSION: 1. Normal sonographic appearance of the uterus and endometrium. 2. Nonvisualization of the right ovary. 3. Right hydrosalpinx, similar appearance to CT examination 11/07/2022. 4. Trace free fluid within the pelvis, nonspecific.     PUS today 8.55cm uterus Normal ovaries 1.30cm  fibroid Endometrial lining 3.40mm 3.4MM possible feeder vessel ?polyp Right hydrosalpinx is still present No adnexal masses or free fluid  BME: tender boggy 8cm uterus with tenderness palpated over right adnexa  A/p recurrent PM bleeding, fibroids, hydrosalpinx, pelvic pain  EMB collected today.  Patient would like a definitive treatment with the [facility]/BSO/cystoscopy and would like to avoid any future procedures with more bleeding.  The procedure was discussed in detail.  To send a case request. 2. RTC tomorrow for annual exam 3. Smoker with hypoestrogen. To get dxa scan  Dr. Ashby Dawes. Karma Greaser

## 2023-10-07 ENCOUNTER — Encounter: Payer: Self-pay | Admitting: Obstetrics and Gynecology

## 2023-10-07 ENCOUNTER — Ambulatory Visit (INDEPENDENT_AMBULATORY_CARE_PROVIDER_SITE_OTHER): Payer: 59 | Admitting: Obstetrics and Gynecology

## 2023-10-07 ENCOUNTER — Other Ambulatory Visit (HOSPITAL_COMMUNITY)
Admission: RE | Admit: 2023-10-07 | Discharge: 2023-10-07 | Disposition: A | Discharge status: Home / Self Care | Payer: 59 | Source: Ambulatory Visit | Attending: Obstetrics and Gynecology | Admitting: Obstetrics and Gynecology

## 2023-10-07 VITALS — BP 122/82 | HR 82 | Resp 16 | Ht 62.0 in | Wt 226.0 lb

## 2023-10-07 DIAGNOSIS — Z01419 Encounter for gynecological examination (general) (routine) without abnormal findings: Secondary | ICD-10-CM | POA: Insufficient documentation

## 2023-10-07 DIAGNOSIS — Z9289 Personal history of other medical treatment: Secondary | ICD-10-CM | POA: Diagnosis not present

## 2023-10-07 DIAGNOSIS — Z1151 Encounter for screening for human papillomavirus (HPV): Secondary | ICD-10-CM | POA: Diagnosis not present

## 2023-10-07 DIAGNOSIS — D219 Benign neoplasm of connective and other soft tissue, unspecified: Secondary | ICD-10-CM | POA: Diagnosis not present

## 2023-10-07 NOTE — Addendum Note (Signed)
Addended by: Earley Favor on: 10/07/2023 08:15 AM   Modules accepted: Orders

## 2023-10-07 NOTE — Progress Notes (Signed)
62 y.o. y.o. female here for annual exam. Patient's last menstrual period was 04/06/2017.     Body mass index is 41.34 kg/m.     07/02/2023   10:47 AM 06/16/2023    1:22 PM 05/19/2023    2:31 PM  Depression screen PHQ 2/9  Decreased Interest 3 3 1   Down, Depressed, Hopeless 1 1 0  PHQ - 2 Score 4 4 1   Altered sleeping 2 2 0  Tired, decreased energy 1 1 0  Change in appetite 1 1 0  Feeling bad or failure about yourself  1 1 0  Trouble concentrating 0 0 0  Moving slowly or fidgety/restless 0 0 0  Suicidal thoughts 0 0 0  PHQ-9 Score 9 9 1     Blood pressure 122/82, pulse 82, resp. rate 16, height 5\' 2"  (1.575 m), weight 226 lb (102.5 kg), last menstrual period 04/06/2017.     Component Value Date/Time   DIAGPAP  08/29/2021 1547    - Negative for intraepithelial lesion or malignancy (NILM)   DIAGPAP  10/25/2020 0951    - Negative for intraepithelial lesion or malignancy (NILM)   DIAGPAP Molecular only (A) 07/26/2019 1041   HPVHIGH Negative 08/29/2021 1547   HPVHIGH Negative 10/25/2020 0951   ADEQPAP  08/29/2021 1547    Satisfactory for evaluation; transformation zone component PRESENT.   ADEQPAP  10/25/2020 0951    Satisfactory for evaluation; transformation zone component PRESENT.   ADEQPAP Molecular only 07/26/2019 1041    GYN HISTORY:    Component Value Date/Time   DIAGPAP  08/29/2021 1547    - Negative for intraepithelial lesion or malignancy (NILM)   DIAGPAP  10/25/2020 0951    - Negative for intraepithelial lesion or malignancy (NILM)   DIAGPAP Molecular only (A) 07/26/2019 1041   HPVHIGH Negative 08/29/2021 1547   HPVHIGH Negative 10/25/2020 0951   ADEQPAP  08/29/2021 1547    Satisfactory for evaluation; transformation zone component PRESENT.   ADEQPAP  10/25/2020 0951    Satisfactory for evaluation; transformation zone component PRESENT.   ADEQPAP Molecular only 07/26/2019 1041    OB History  Gravida Para Term Preterm AB Living  1 1 1   1   SAB  IAB Ectopic Multiple Live Births          # Outcome Date GA Lbr Len/2nd Weight Sex Type Anes PTL Lv  1 Term             Past Medical History:  Diagnosis Date   Anxiety    Arthritis    "knees" (02/21/2014), back   CHF (congestive heart failure) (HCC)    Chronic bronchitis (HCC)    "get it q yr" (02/21/2014)   Chronic diastolic CHF (congestive heart failure) (HCC)    a. Echo 10/23/2016: EF 75 %   Chronic lower back pain    Complication of anesthesia    "I don't come out well; I chew on my tongue"   COPD (chronic obstructive pulmonary disease) (HCC)    Depression    Diabetes mellitus without complication (HCC)    TYPE 2   Family history of adverse reaction to anesthesia    BROTHER PONV, had a blood clot several days later and heart stopped   Fatty liver    NONALCOLIC   Fibroid    Heart murmur    Hypertension    Infection of right eye    current dx on Saturday 7/15 - using drops   Leg swelling    bilateral  Migraine 1982-2009   MVP (mitral valve prolapse)    Neuromuscular disorder (HCC)    right leg nerve pain   Shortness of breath    WITH EXERTION   Sinus pause    a. noted on telemetry during admission from 10/2016, lasting up to 4.4 seconds. BB discontinued.    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   D/Dx: carpal tunnel, ulnar neuropathy Less likely: cervical radiculopathy, vasculitis     Past Surgical History:  Procedure Laterality Date   CARPAL TUNNEL RELEASE Right    CESAREAN SECTION  1982   CHOLECYSTECTOMY N/A 06/17/2017   Procedure: LAPAROSCOPIC CHOLECYSTECTOMY WITH INTRAOPERATIVE CHOLANGIOGRAM;  Surgeon: Manus Rudd,  MD;  Location: [facility] OR;  Service: General;  Laterality: N/A;   COLONOSCOPY WITH PROPOFOL N/A 04/28/2017   Procedure: COLONOSCOPY WITH PROPOFOL;  Surgeon: Ruffin Frederick, MD;  Location: [facility] ENDOSCOPY;  Service: Gastroenterology;  Laterality: N/A;   DILATATION & CURETTAGE/HYSTEROSCOPY WITH MYOSURE N/A 05/06/2016   Procedure: DILATATION & CURETTAGE/HYSTEROSCOPY WITH MYOSURE;  Surgeon: Ok Edwards, MD;  Location: [facility] ORS;  Service: Gynecology;  Laterality: N/A;  request to follow around 8:45  requests one hour OR time   DILATATION & CURETTAGE/HYSTEROSCOPY WITH MYOSURE N/A 04/03/2017   Procedure: DILATATION & CURETTAGE/HYSTEROSCOPY WITH MYOSURE;  Surgeon: Ok Edwards, MD;  Location: [facility];  Service: Gynecology;  Laterality: N/A;   DILATION AND CURETTAGE OF UTERUS     ESOPHAGOGASTRODUODENOSCOPY (EGD) WITH PROPOFOL N/A 04/28/2017   Procedure: ESOPHAGOGASTRODUODENOSCOPY (EGD) WITH PROPOFOL;  Surgeon: Ruffin Frederick, MD;  Location: [facility];  Service: Gastroenterology;  Laterality: N/A;   FOOT SURGERY Bilateral    "took bones out; put pins in"   IR ABLATE LIVER CRYOABLATION  02/08/2020   IR RADIOLOGIST EVAL & MGMT  02/03/2020   KNEE ARTHROSCOPY Left 08/16/2018   Procedure: ARTHROSCOPY KNEE PARTIAL MEDIAL MENISECTOMY, CHONDROPLASTY MEDIAL AND LATERAL, PLICA RELEASE MEDIAL;  Surgeon: Jodi Geralds, MD;  Location: [facility] OR;  Service: Orthopedics;  Laterality: Left;   LEFT AND RIGHT HEART CATHETERIZATION WITH CORONARY ANGIOGRAM N/A 03/31/2014   Procedure: LEFT AND RIGHT HEART CATHETERIZATION WITH CORONARY ANGIOGRAM;  Surgeon: Ricki Rodriguez, MD;  Location: [facility] CATH LAB;  Service: Cardiovascular;  Laterality: N/A;   MYOMECTOMY  YRS AGO   sonohystogram  04/14/2016   [facility]: intramural fibroid, premenopausal endometrium, right fluid filled tubular structure    Current Outpatient Medications on File Prior to Visit  Medication Sig Dispense Refill    Accu-Chek Softclix Lancets lancets Use to check blood sugar once daily. E11.9 100 each 12   allopurinol (ZYLOPRIM) 300 MG tablet Take 1 tablet (300 mg total) by mouth daily. 90 tablet 1   amLODipine (NORVASC) 2.5 MG tablet TAKE 1 TABLET BY MOUTH AT BEDTIME. 90 tablet 3   aspirin EC 81 MG tablet Take 81 mg by mouth daily.     atorvastatin (LIPITOR) 40 MG tablet TAKE 1 TABLET BY MOUTH EVERY DAY 90 tablet 3   fluticasone (FLONASE) 50 MCG/ACT nasal spray SPRAY 2 SPRAYS INTO EACH NOSTRIL EVERY DAY 48 mL 2   glucose blood (  ACCU-CHEK AVIVA) test strip Use to check sugar up to 3 times daily 100 each 12   hydrALAZINE (APRESOLINE) 100 MG tablet Take 1 tablet (100 mg total) by mouth 3 (three) times daily. 270 tablet 3   ibuprofen (ADVIL) 800 MG tablet Take 1 tablet (800 mg total) by mouth every 8 (eight) hours as needed for moderate pain. 20 tablet 0   lidocaine-prilocaine (EMLA) cream APPLY 1 APPLICATION TOPICALLY AS NEEDED. FOR UNDER BREAST 30 g 2   losartan (COZAAR) 100 MG tablet TAKE 1 TABLET BY MOUTH EVERY DAY 90 tablet 3   Melaton-Thean-Cham-PassF-LBalm (MELATONIN + L-THEANINE) CAPS Take 2 capsules by mouth at bedtime as needed (sleep).     ondansetron (ZOFRAN-ODT) 8 MG disintegrating tablet Take 1 tablet by mouth every 8 (eight) hours as needed.     pindolol (VISKEN) 10 MG tablet TAKE 2 TABLETS BY MOUTH 2 TIMES DAILY. 360 tablet 3   Polyvinyl Alcohol-Povidone (CLEAR EYES ALL SEASONS OP) Place 1 drop into both eyes daily as needed (allergies/itchy eyes).     spironolactone (ALDACTONE) 25 MG tablet TAKE 1 TABLET BY MOUTH EVERYDAY AT BEDTIME 90 tablet 3   torsemide (DEMADEX) 20 MG tablet Take 1 tablet (20 mg total) by mouth every other day. (Patient taking differently: Take 20 mg by mouth daily.)     TRELEGY ELLIPTA 200-62.5-25 MCG/ACT AEPB USE AS DIRECTED 1 PUFF IN THE MOUTH OR THROAT DAILY. TAKE 1 PUFF BY MOUTH EVERY DAY 60 each 3   triamcinolone (KENALOG) 0.025 % ointment Apply 1 Application topically  2 (two) times daily as needed. 75 g 0   No current facility-administered medications on file prior to visit.    Social History   Socioeconomic History   Marital status: Divorced    Spouse name: Not on file   Number of children: 1   Years of education: 12   Highest education level: 12th grade  Occupational History   Occupation: Set designer    Comment: disabled   Occupation: patient care assistance  Tobacco Use   Smoking status: Every Day    Current packs/day: 0.50    Average packs/day: 0.5 packs/day for 47.0 years (23.5 ttl pk-yrs)    Types: Cigarettes    Start date: 10/20/1976    Passive exposure: Current   Smokeless tobacco: Never   Tobacco comments:    Previous 1 ppd smoker x > 40 years.  Now smokes 10 cigarettes per day  Vaping Use   Vaping status: Never Used  Substance and Sexual Activity   Alcohol use: Yes    Comment: occasional   Drug use: Yes    Types: Marijuana    Comment: 03/30/2014 "stopped using crack 02/21/2014"   Sexual activity: Yes    Partners: Male    Birth control/protection: Post-menopausal  Other Topics Concern   Not on file  Social History Narrative   Divorced since 2015.   Lives alone. No pets. Daughter lives in [place]. Daughter helps with transportation. Talks daily on phone, sees once or twice a week. 2 grandkids.   Enjoys church at [employer], [place] geared towards people with substance abuse history.    Lives in duplex apartment, one level, three stairs to get in. Has handrails, has grab bars in bathroom. Smoke alarms.   Bakes most meals, avoids starches, rice. Eats vegetables. Drinks tea, ginger ale.    Likes to go to movies, used to walk daily but not now due to knee pain. Likes to spend time with grandchildren, go  out to eat. Spend time with family and circle of friends.    Social Drivers of Corporate investment banker Strain: Low Risk  (04/24/2023)   Overall Financial Resource Strain (CARDIA)    Difficulty of Paying Living  Expenses: Not hard at all  Food Insecurity: No Food Insecurity (04/24/2023)   Hunger Vital Sign    Worried About Running Out of Food in the Last Year: Never true    Ran Out of Food in the Last Year: Never true  Transportation Needs: No Transportation Needs (04/24/2023)   PRAPARE - Administrator, Civil Service (Medical): No    Lack of Transportation (Non-Medical): No  Physical Activity: Inactive (04/24/2023)   Exercise Vital Sign    Days of Exercise per Week: 0 days    Minutes of Exercise per Session: 0 min  Stress: No Stress Concern Present (04/24/2023)   [employer] of Occupational Health - Occupational Stress Questionnaire    Feeling of Stress : Not at all  Social Connections: Moderately Isolated (04/24/2023)   Social Connection and Isolation Panel [NHANES]    Frequency of Communication with Friends and Family: Three times a week    Frequency of Social Gatherings with Friends and Family: Once a week    Attends Religious Services: More than 4 times per year    Active Member of [employer] or Organizations: No    Attends Banker Meetings: Never    Marital Status: Divorced  Catering manager Violence: Not At Risk (04/24/2023)   Humiliation, Afraid, Rape, and Kick questionnaire    Fear of Current or Ex-Partner: No    Emotionally Abused: No    Physically Abused: No    Sexually Abused: No    Family History  Problem Relation Age of Onset   Hypertension Mother    COPD Mother    Diabetes Father    Hypertension Father    Cancer Sister        UTERINE???   COPD Sister    Hypertension Sister    Hypertension Sister      Allergies  Allergen Reactions   Bupropion Other (See Comments)    Throat soreness and difficulty swallowing.    Norvasc [Amlodipine] Swelling    Significant leg swelling with 10mg . Able to tolerate 2.5mg .       Patient's last menstrual period was Patient's last menstrual period was 04/06/2017.Marland Kitchen            Review of Systems Alls systems  reviewed and are negative.     Physical Exam Constitutional:      Appearance: Normal appearance.  Genitourinary:     Vulva and urethral meatus normal.     No lesions in the vagina.     Right Labia: No rash, lesions or skin changes.    Left Labia: No lesions, skin changes or rash.    No vaginal discharge or tenderness.     No vaginal prolapse present.    No vaginal atrophy present.     Right Adnexa: not tender, not palpable and no mass present.    Left Adnexa: not tender, not palpable and no mass present.    No cervical motion tenderness or discharge.     Uterus is not enlarged, tender or irregular.  Breasts:    Right: Normal.     Left: Normal.  HENT:     Head: Normocephalic.  Neck:     Thyroid: No thyroid mass, thyromegaly or thyroid tenderness.  Cardiovascular:  Rate and Rhythm: Normal rate and regular rhythm.     Heart sounds: Normal heart sounds, S1 normal and S2 normal.  Pulmonary:     Effort: Pulmonary effort is normal.     Breath sounds: Normal breath sounds and air entry.  Abdominal:     General: There is no distension.     Palpations: Abdomen is soft. There is no mass.     Tenderness: There is no abdominal tenderness. There is no guarding or rebound.  Musculoskeletal:        General: Normal range of motion.     Cervical back: Full passive range of motion without pain, normal range of motion and neck supple. No tenderness.     Right lower leg: No edema.     Left lower leg: No edema.  Neurological:     Mental Status: She is alert.  Skin:    General: Skin is warm.  Psychiatric:        Mood and Affect: Mood normal.        Behavior: Behavior normal.        Thought Content: Thought content normal.  Vitals and nursing note reviewed. Exam conducted with a chaperone present.       A:         Well Woman GYN exam                             P:        Pap smear collected today Encouraged annual mammogram screening Colon cancer screening up-to-date DXA  ordered today Labs and immunizations to do with PMD Discussed breast self exams Encouraged healthy lifestyle practices Encouraged Vit D and Calcium   No follow-ups on file.  Earley Favor

## 2023-10-08 LAB — SURESWAB® ADVANCED VAGINITIS PLUS,TMA
C. trachomatis RNA, TMA: NOT DETECTED
CANDIDA SPECIES: NOT DETECTED
Candida glabrata: NOT DETECTED
N. gonorrhoeae RNA, TMA: NOT DETECTED
SURESWAB(R) ADV BACTERIAL VAGINOSIS(BV),TMA: NEGATIVE
TRICHOMONAS VAGINALIS (TV),TMA: NOT DETECTED

## 2023-10-08 LAB — SURGICAL PATHOLOGY

## 2023-10-09 LAB — CYTOLOGY - PAP
Comment: NEGATIVE
Diagnosis: NEGATIVE
Diagnosis: REACTIVE
High risk HPV: NEGATIVE

## 2023-10-09 NOTE — Discharge Instructions (Signed)
Post Procedure Spinal Discharge Instruction Sheet  You may resume a regular diet and any medications that you routinely take (including pain medications) unless otherwise noted by MD.  No driving day of procedure.  Light activity throughout the rest of the day.  Do not do any strenuous work, exercise, bending or lifting.  The day following the procedure, you can resume normal physical activity but you should refrain from exercising or physical therapy for at least three days thereafter.  You may apply ice to the injection site, 20 minutes on, 20 minutes off, as needed. Do not apply ice directly to skin.    Common Side Effects:  Headaches- take your usual medications as directed by your physician.  Increase your fluid intake.  Caffeinated beverages may be helpful.  Lie flat in bed until your headache resolves.  Restlessness or inability to sleep- you may have trouble sleeping for the next few days.  Ask your referring physician if you need any medication for sleep.  Facial flushing or redness- should subside within a few days.  Increased pain- a temporary increase in pain a day or two following your procedure is not unusual.  Take your pain medication as prescribed by your referring physician.  Leg cramps  Please contact our office at [phone] for the following symptoms: Fever greater than 100 degrees. Headaches unresolved with medication after 2-3 days. Increased swelling, pain, or redness at injection site.   Thank you for visiting [facility] today.    YOU MAY RESUME YOUR ASPIRIN TODAY

## 2023-10-12 ENCOUNTER — Ambulatory Visit
Admission: RE | Admit: 2023-10-12 | Discharge: 2023-10-12 | Disposition: A | Discharge status: Home / Self Care | Payer: 59 | Source: Ambulatory Visit | Attending: Family Medicine | Admitting: Family Medicine

## 2023-10-12 DIAGNOSIS — M4727 Other spondylosis with radiculopathy, lumbosacral region: Secondary | ICD-10-CM | POA: Diagnosis not present

## 2023-10-12 DIAGNOSIS — M5416 Radiculopathy, lumbar region: Secondary | ICD-10-CM

## 2023-10-12 MED ORDER — METHYLPREDNISOLONE ACETATE 40 MG/ML INJ SUSP (RADIOLOG
80.0000 mg | Freq: Once | INTRAMUSCULAR | Status: AC
  Administered 2023-10-12: 80 mg via EPIDURAL

## 2023-10-12 MED ORDER — IOPAMIDOL (ISOVUE-M 200) INJECTION 41%
1.0000 mL | Freq: Once | INTRAMUSCULAR | Status: AC
  Administered 2023-10-12: 1 mL via EPIDURAL

## 2023-10-18 IMAGING — US US BREAST*L* LIMITED INC AXILLA
1 series · 8 of 8 positions shown · non-contrast
Comparison: Previous exams including most recent outside bilateral
screening mammogram dated 02/27/2021.

CLINICAL DATA: Patient describes a recent palpable lump within the
lower LEFT breast, inframammary fold region, now resolved. Patient
also describes a vague linear area of concern within the same region
of the lower LEFT breast.

EXAM:
DIGITAL DIAGNOSTIC UNILATERAL LEFT MAMMOGRAM WITH TOMOSYNTHESIS AND
CAD; ULTRASOUND LEFT BREAST LIMITED
TECHNIQUE: Left digital diagnostic mammography and breast tomosynthesis was
performed. The images were evaluated with computer-aided detection.;
Targeted ultrasound examination of the left breast was performed.

[Series 1: us breast*left* limited inc axilla · 0.06mm/px · 8 of 8 slices shown]
[im 1/8]
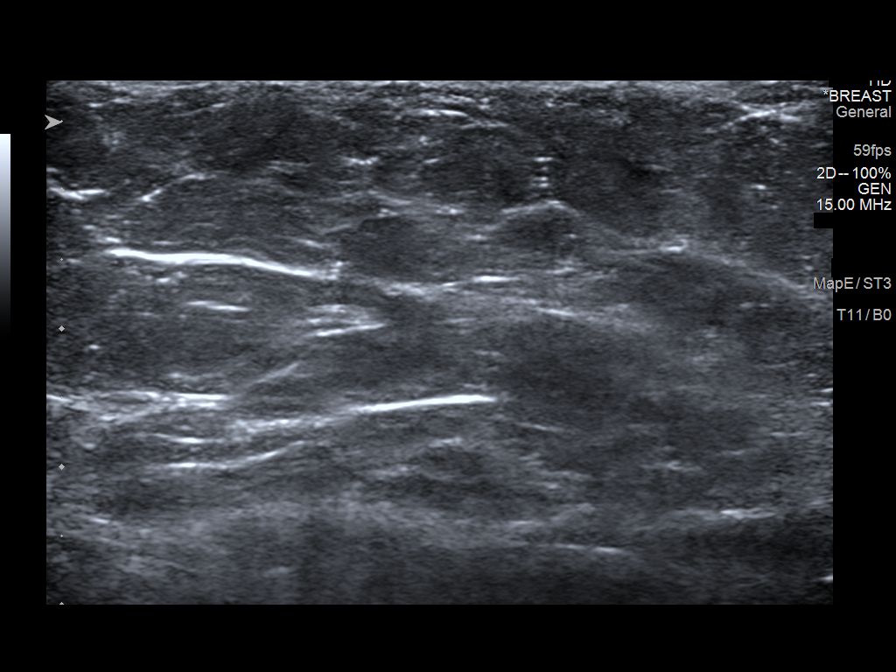
[im 2/8]
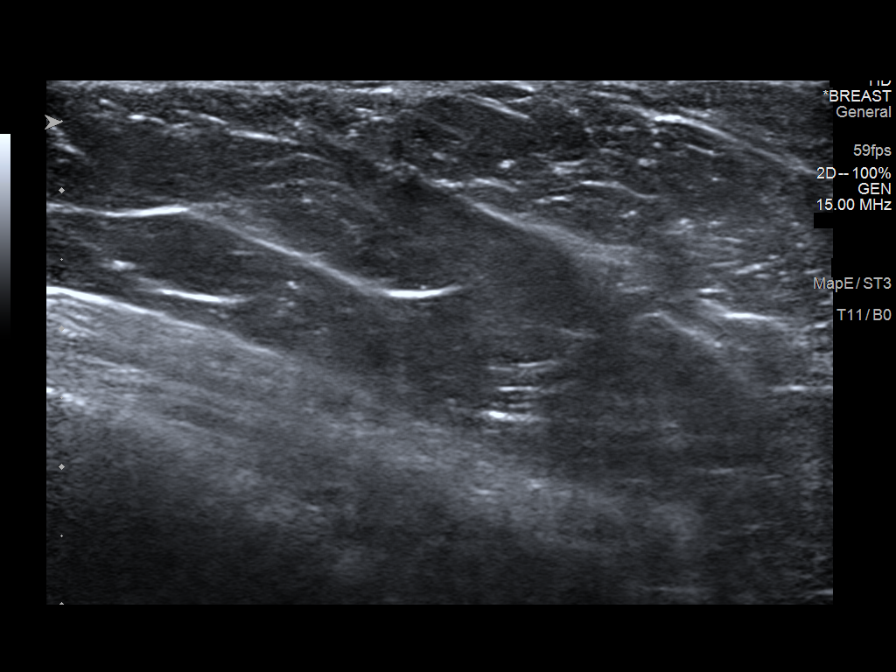
[im 3/8]
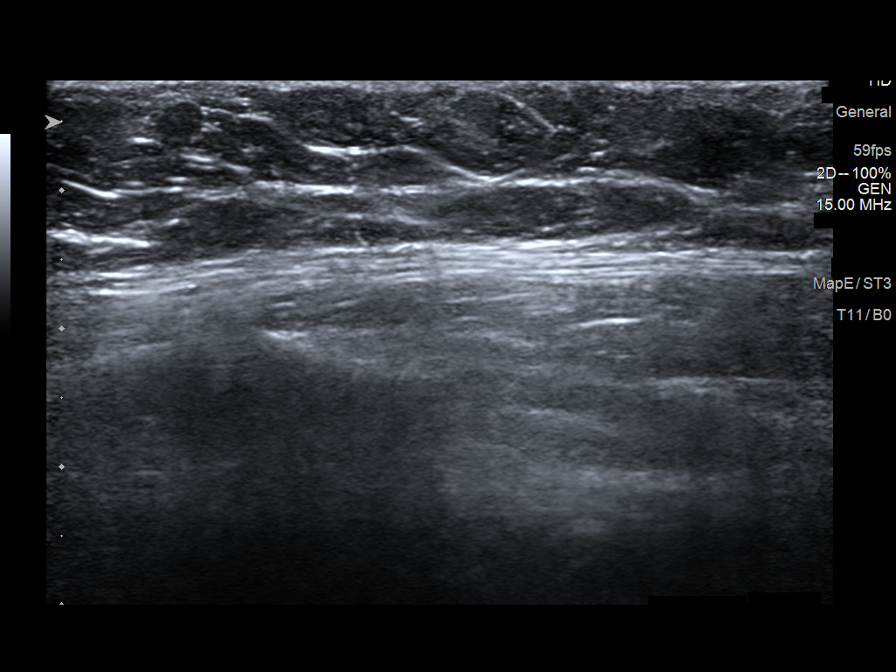
[im 4/8]
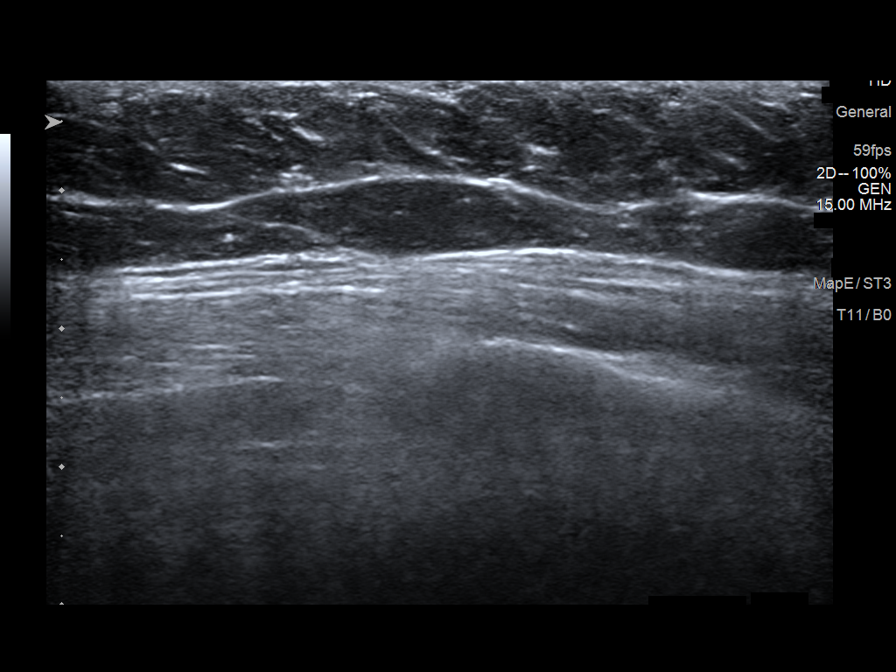
[im 5/8]
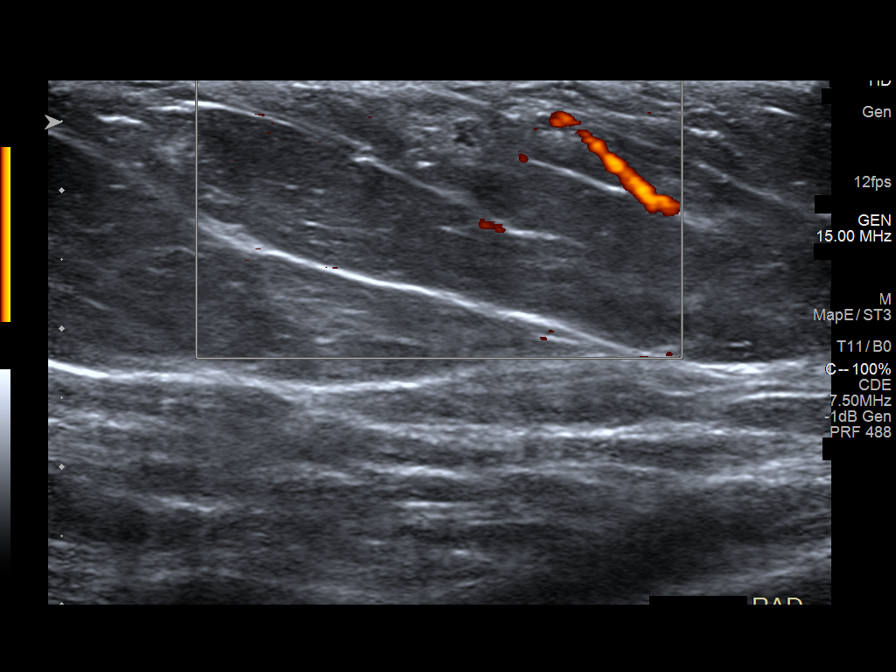
[im 6/8]
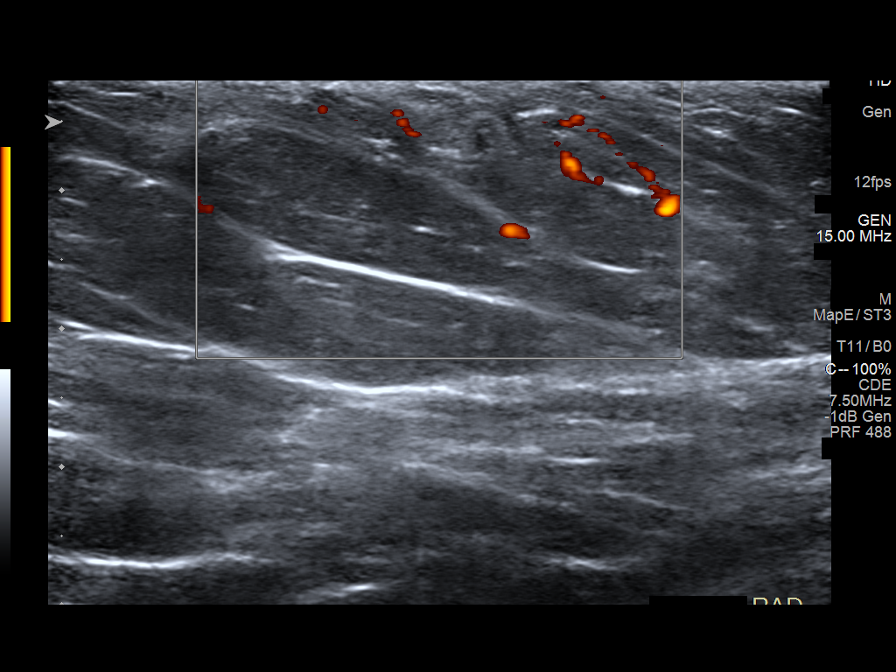
[im 7/8]
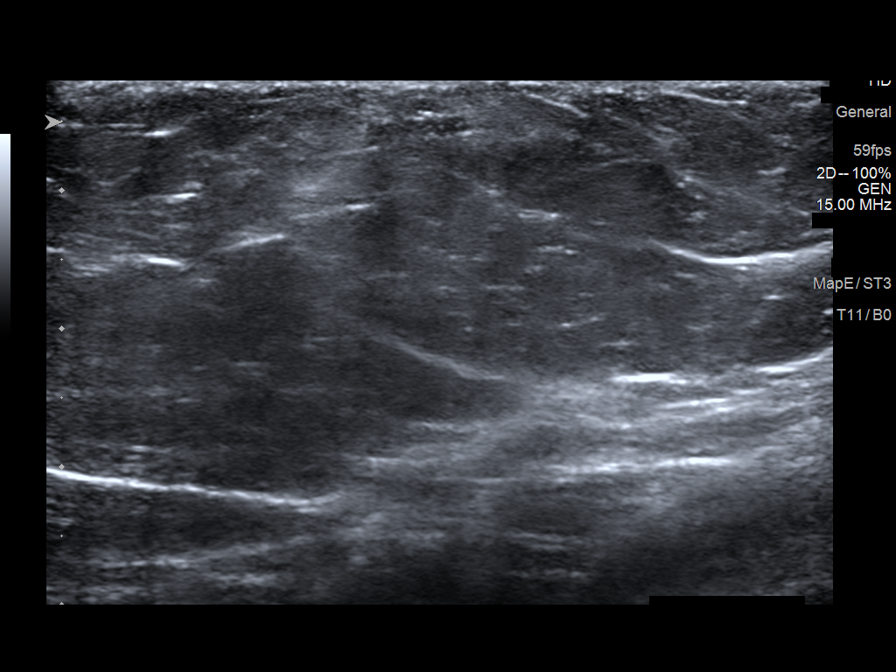
[im 8/8]
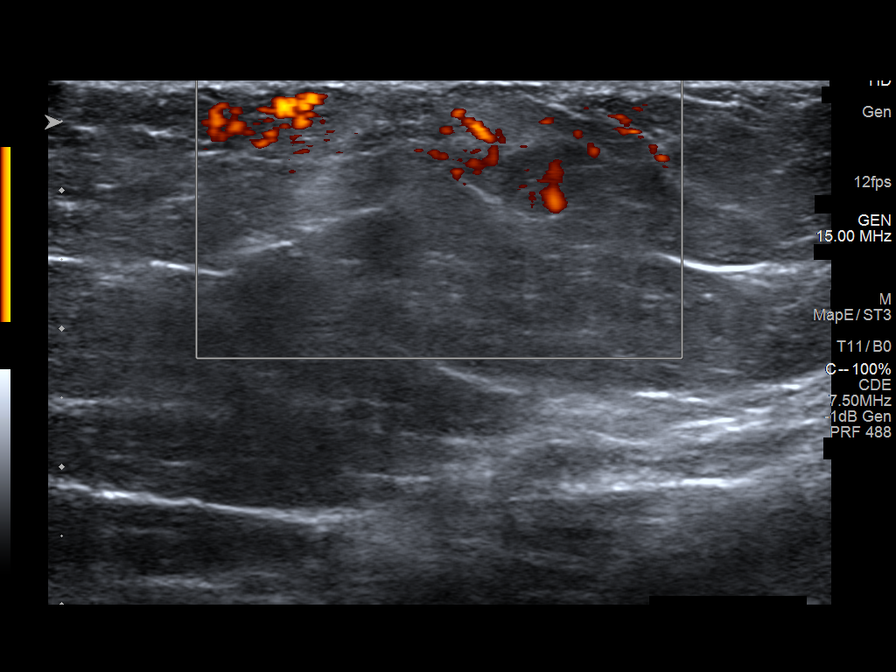

[8 of 8 positions shown; findings below may reference images not displayed]

ACR Breast Density Category b: There are scattered areas of
fibroglandular density.
FINDINGS: There are no new dominant masses, suspicious calcifications or
secondary signs of malignancy within the LEFT breast.

On physical exam, there is a palpable "cord like" area within the
inframammary fold region of the lower LEFT breast. Patient describes
tenderness within this area.

Targeted ultrasound is performed, showing a focally thrombosed
superficial vein within the lower LEFT breast, 5 o'clock axis,
inframammary fold region, compatible with a superficial
thrombophlebitis (Mondor's disease), corresponding to patient's
palpable area of concern.
IMPRESSION: 1. Focal superficial thrombophlebitis (Mondor's disease) of the
lower LEFT breast, corresponding to patient's palpable area of
concern. Patient was reassured that this is a benign finding which
is typically self-limiting.
2. No evidence of malignancy within the LEFT breast.

RECOMMENDATION:
1. Annual screening mammograms. Next bilateral screening mammogram
will be due in Thursday February, 2022.
2. The patient was instructed to return sooner if the area that she
feels becomes larger and/or firmer to palpation, or if a new
palpable abnormality is identified in either breast.

I have discussed the findings and recommendations with the patient.
If applicable, a reminder letter will be sent to the patient
regarding the next appointment.

BI-RADS CATEGORY  2: Benign.

## 2023-10-18 IMAGING — MG MM DIGITAL DIAGNOSTIC UNILAT*L* W/ TOMO W/ CAD
6 series · 6 of 18 positions shown · non-contrast
Comparison: Previous exams including most recent outside bilateral
screening mammogram dated 02/27/2021.

CLINICAL DATA: Patient describes a recent palpable lump within the
lower LEFT breast, inframammary fold region, now resolved. Patient
also describes a vague linear area of concern within the same region
of the lower LEFT breast.

EXAM:
DIGITAL DIAGNOSTIC UNILATERAL LEFT MAMMOGRAM WITH TOMOSYNTHESIS AND
CAD; ULTRASOUND LEFT BREAST LIMITED
TECHNIQUE: Left digital diagnostic mammography and breast tomosynthesis was
performed. The images were evaluated with computer-aided detection.;
Targeted ultrasound examination of the left breast was performed.

[L CC synth-2D (1 of 2)]
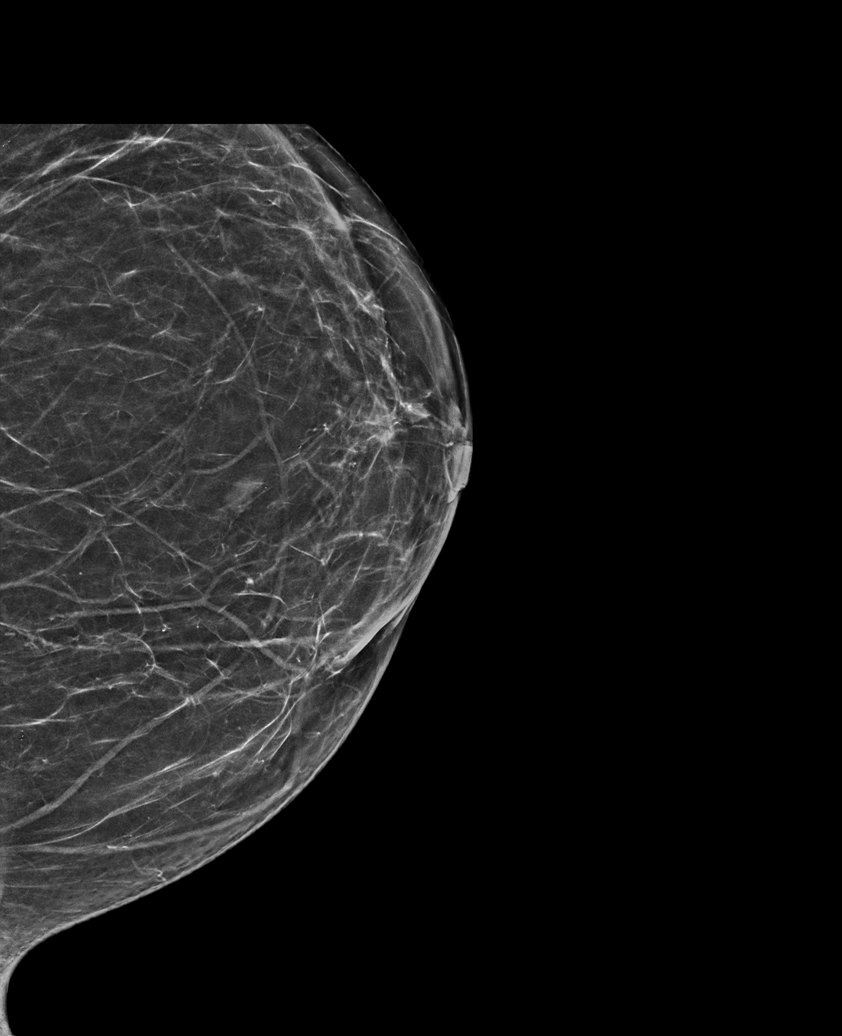

[L CC synth-2D (2 of 2)]
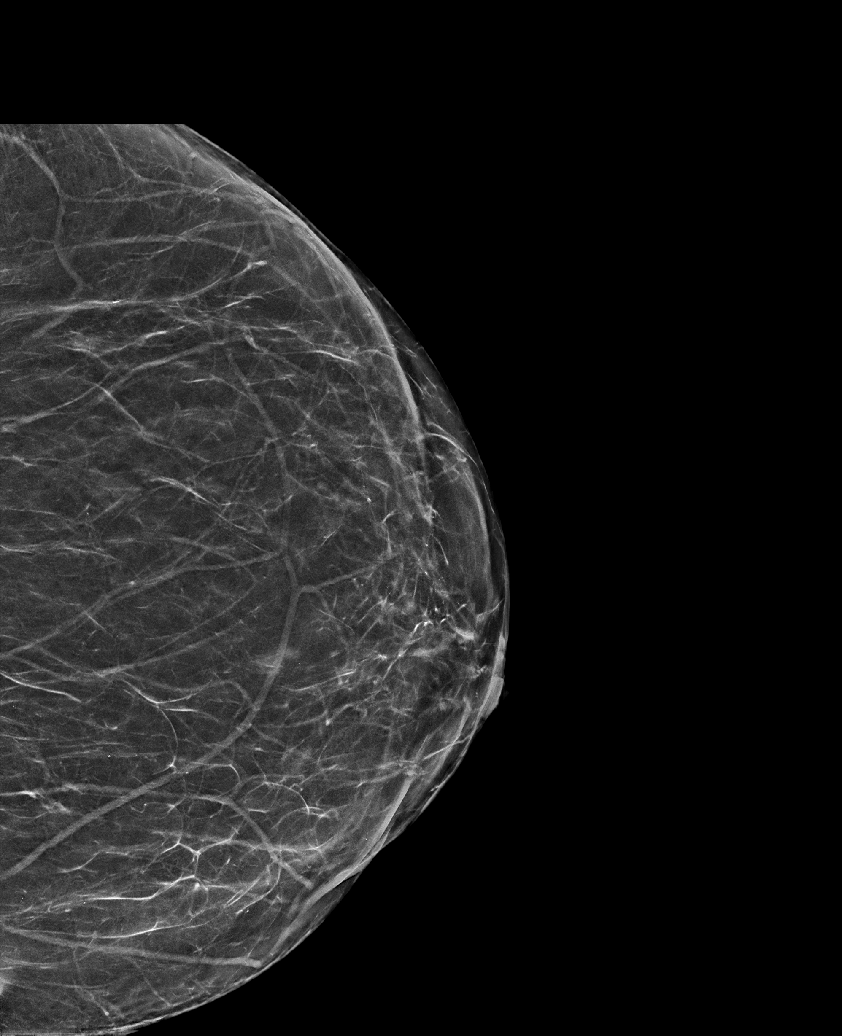

[L MLO synth-2D]
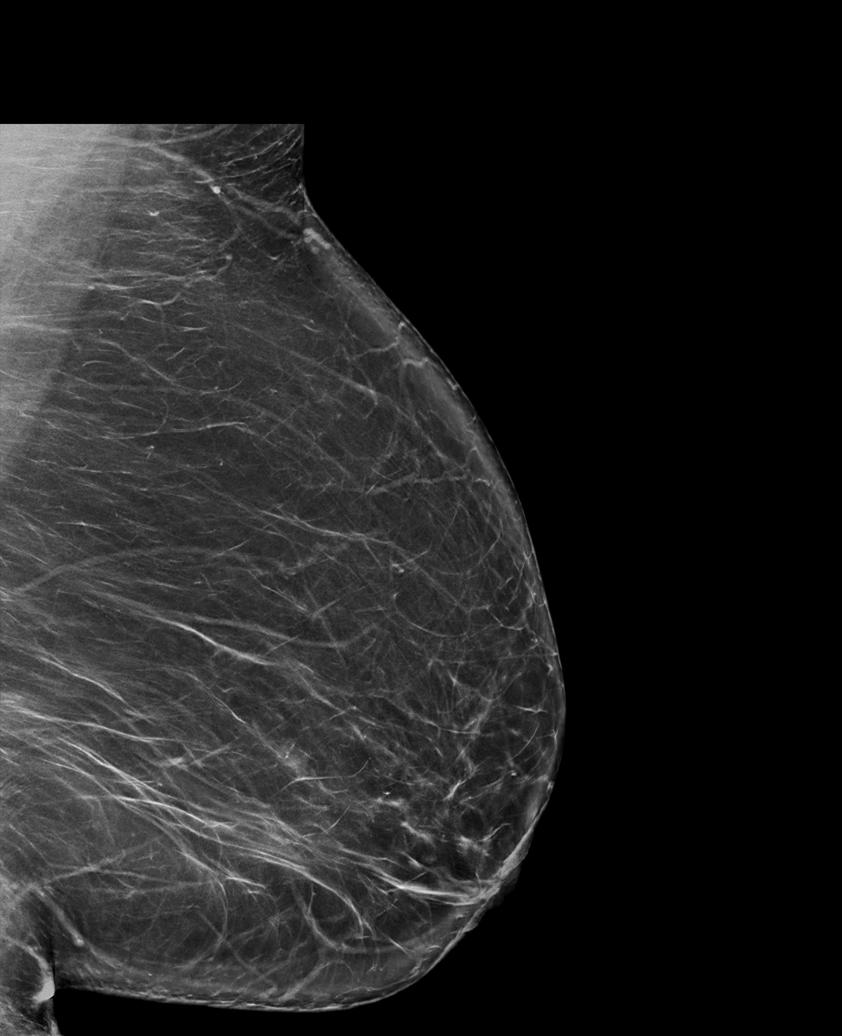

[L CC tomo (1 of 2) · tomo slice 33/65.0]
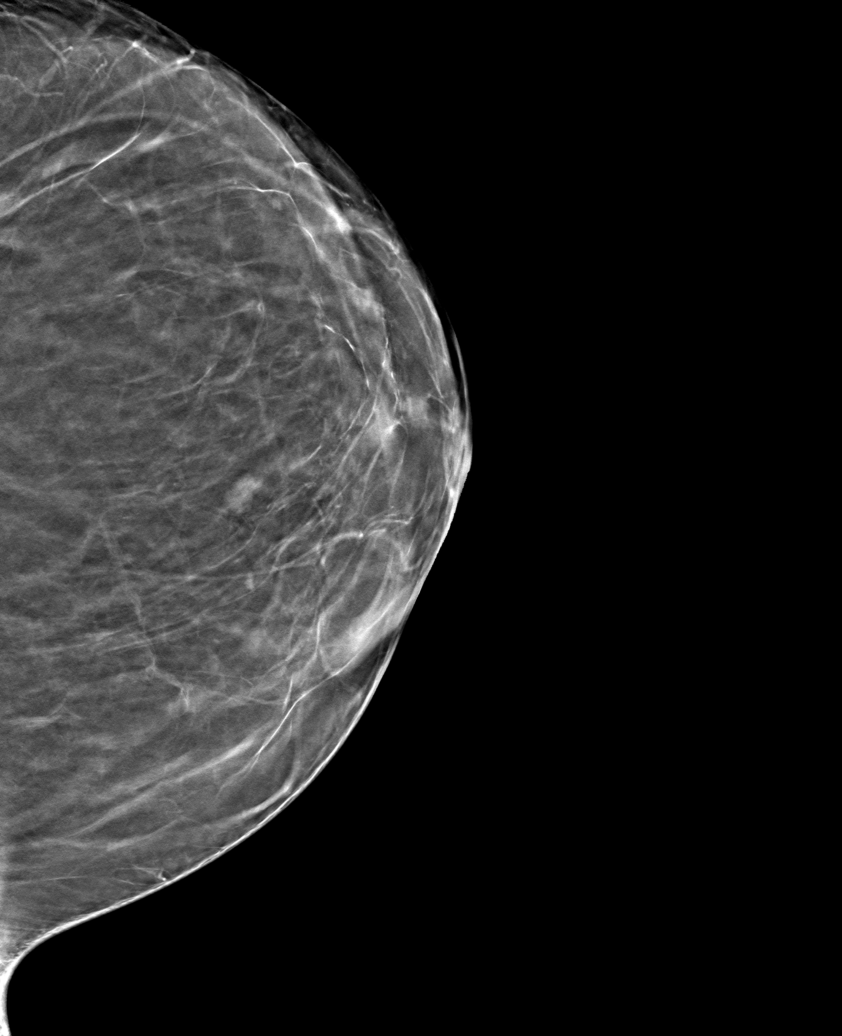

[L MLO tomo · tomo slice 46/91.0]
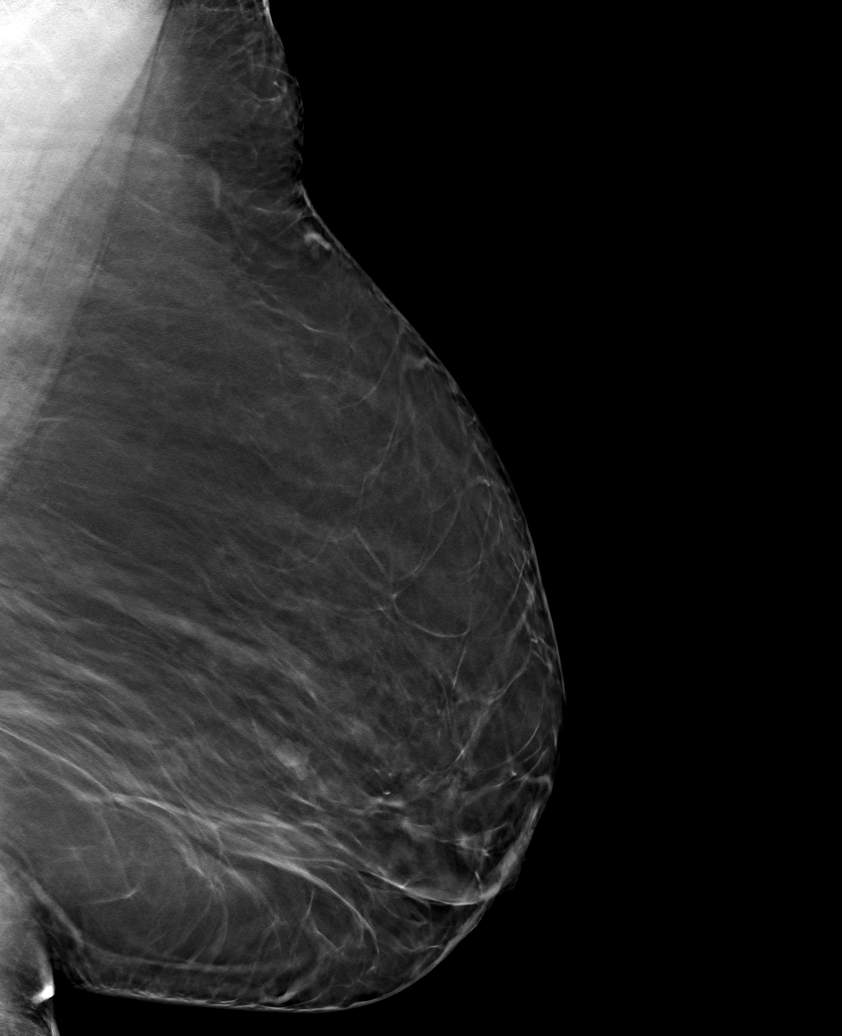

[L CC tomo (2 of 2) · tomo slice 35/68.0]
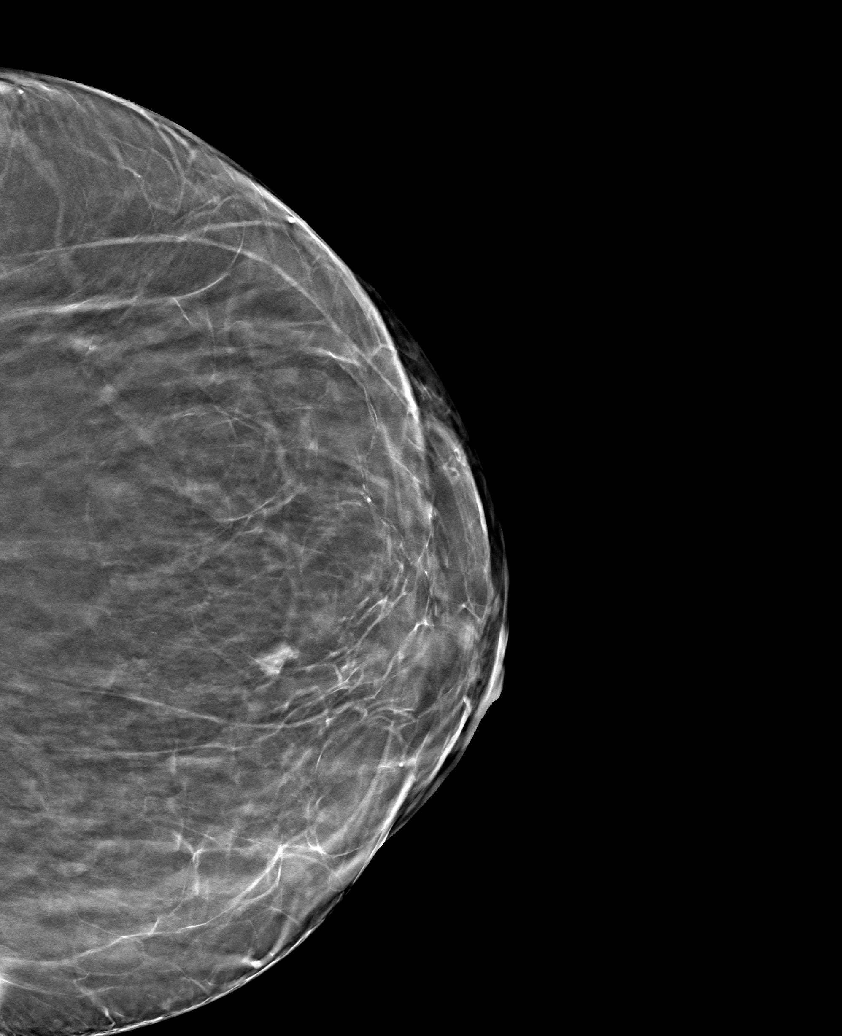

[6 of 18 positions shown; findings below may reference images not displayed]

ACR Breast Density Category b: There are scattered areas of
fibroglandular density.
FINDINGS: There are no new dominant masses, suspicious calcifications or
secondary signs of malignancy within the LEFT breast.

On physical exam, there is a palpable "cord like" area within the
inframammary fold region of the lower LEFT breast. Patient describes
tenderness within this area.

Targeted ultrasound is performed, showing a focally thrombosed
superficial vein within the lower LEFT breast, 5 o'clock axis,
inframammary fold region, compatible with a superficial
thrombophlebitis (Mondor's disease), corresponding to patient's
palpable area of concern.
IMPRESSION: 1. Focal superficial thrombophlebitis (Mondor's disease) of the
lower LEFT breast, corresponding to patient's palpable area of
concern. Patient was reassured that this is a benign finding which
is typically self-limiting.
2. No evidence of malignancy within the LEFT breast.

RECOMMENDATION:
1. Annual screening mammograms. Next bilateral screening mammogram
will be due in Thursday February, 2022.
2. The patient was instructed to return sooner if the area that she
feels becomes larger and/or firmer to palpation, or if a new
palpable abnormality is identified in either breast.

I have discussed the findings and recommendations with the patient.
If applicable, a reminder letter will be sent to the patient
regarding the next appointment.

BI-RADS CATEGORY  2: Benign.

## 2023-10-19 ENCOUNTER — Ambulatory Visit (INDEPENDENT_AMBULATORY_CARE_PROVIDER_SITE_OTHER): Payer: 59 | Admitting: Student

## 2023-10-19 ENCOUNTER — Encounter: Payer: Self-pay | Admitting: Student

## 2023-10-19 VITALS — BP 167/104 | HR 72 | Ht 62.0 in | Wt 225.2 lb

## 2023-10-19 DIAGNOSIS — I1A0 Resistant hypertension: Secondary | ICD-10-CM

## 2023-10-19 DIAGNOSIS — R1030 Lower abdominal pain, unspecified: Secondary | ICD-10-CM

## 2023-10-19 MED ORDER — AMOXICILLIN-POT CLAVULANATE 875-125 MG PO TABS: 1.0000 | ORAL_TABLET | Freq: Three times a day (TID) | ORAL | 0 refills | Status: AC

## 2023-10-19 NOTE — Patient Instructions (Signed)
It was great to see you! Thank you for allowing me to participate in your care!  I recommend that you always bring your medications to each appointment as this makes it easy to ensure we are on the correct medications and helps [place] not miss when refills are needed.  Our plans for today:  - Abdominal Pain We are going to treat you for Diverticulitis  You'll take Augmentin every 8 hours, for 10 days *If symptoms not improving after 1 week, make follow up appointment to be seen  Take care and seek immediate care sooner if you develop any concerns.   Dr. Bess Kinds, MD [facility]

## 2023-10-19 NOTE — Progress Notes (Unsigned)
  SUBJECTIVE:   CHIEF COMPLAINT / HPI:   Side Pain Hurting in sides, worse in early morning, hurts when she moves a certain way. Having soft bowel movements. Also having discomfort in stomach. This all started about 3 weeks ago, no trauma to back or belly. Feels pain in back and belly. Pain is sharp and takes her breath away at times. Also urinating okay. Pain comes on after eating, feels like stomach knotting up. No fevers, but had some chills yesterday. Pain worse in the morning and eases off during that day.   PERTINENT  PMH / PSH: ***   OBJECTIVE:  BP (!) 167/104   Pulse 72   Ht 5\' 2"  (1.575 m)   Wt 225 lb 3.2 oz (102.2 kg)   LMP 04/06/2017   SpO2 99%   BMI 41.19 kg/m  Physical Exam   ASSESSMENT/PLAN:   Assessment & Plan  No follow-ups on file. Bess Kinds, MD 10/19/2023, 4:40 PM PGY-***, [facility] {    This will disappear when note is signed, click to select method of visit    :1}

## 2023-10-21 DIAGNOSIS — R109 Unspecified abdominal pain: Secondary | ICD-10-CM | POA: Insufficient documentation

## 2023-10-23 ENCOUNTER — Emergency Department (HOSPITAL_BASED_OUTPATIENT_CLINIC_OR_DEPARTMENT_OTHER)
Admission: EM | Admit: 2023-10-23 | Discharge: 2023-10-23 | Disposition: A | Discharge status: Home / Self Care | Payer: 59 | Attending: Emergency Medicine | Admitting: Emergency Medicine

## 2023-10-23 ENCOUNTER — Other Ambulatory Visit: Payer: Self-pay

## 2023-10-23 ENCOUNTER — Encounter (HOSPITAL_BASED_OUTPATIENT_CLINIC_OR_DEPARTMENT_OTHER): Payer: Self-pay | Admitting: *Deleted

## 2023-10-23 ENCOUNTER — Emergency Department (HOSPITAL_BASED_OUTPATIENT_CLINIC_OR_DEPARTMENT_OTHER): Payer: 59

## 2023-10-23 DIAGNOSIS — R197 Diarrhea, unspecified: Secondary | ICD-10-CM | POA: Diagnosis not present

## 2023-10-23 DIAGNOSIS — J449 Chronic obstructive pulmonary disease, unspecified: Secondary | ICD-10-CM | POA: Diagnosis not present

## 2023-10-23 DIAGNOSIS — R110 Nausea: Secondary | ICD-10-CM | POA: Insufficient documentation

## 2023-10-23 DIAGNOSIS — Z8673 Personal history of transient ischemic attack (TIA), and cerebral infarction without residual deficits: Secondary | ICD-10-CM | POA: Diagnosis not present

## 2023-10-23 DIAGNOSIS — R109 Unspecified abdominal pain: Secondary | ICD-10-CM

## 2023-10-23 DIAGNOSIS — R1011 Right upper quadrant pain: Secondary | ICD-10-CM | POA: Insufficient documentation

## 2023-10-23 DIAGNOSIS — F17200 Nicotine dependence, unspecified, uncomplicated: Secondary | ICD-10-CM | POA: Diagnosis not present

## 2023-10-23 DIAGNOSIS — K5730 Diverticulosis of large intestine without perforation or abscess without bleeding: Secondary | ICD-10-CM | POA: Diagnosis not present

## 2023-10-23 DIAGNOSIS — Z7982 Long term (current) use of aspirin: Secondary | ICD-10-CM | POA: Insufficient documentation

## 2023-10-23 DIAGNOSIS — Z7951 Long term (current) use of inhaled steroids: Secondary | ICD-10-CM | POA: Diagnosis not present

## 2023-10-23 DIAGNOSIS — Z79899 Other long term (current) drug therapy: Secondary | ICD-10-CM | POA: Diagnosis not present

## 2023-10-23 DIAGNOSIS — I5032 Chronic diastolic (congestive) heart failure: Secondary | ICD-10-CM | POA: Insufficient documentation

## 2023-10-23 DIAGNOSIS — D72829 Elevated white blood cell count, unspecified: Secondary | ICD-10-CM | POA: Insufficient documentation

## 2023-10-23 DIAGNOSIS — I110 Hypertensive heart disease with heart failure: Secondary | ICD-10-CM | POA: Insufficient documentation

## 2023-10-23 DIAGNOSIS — R1031 Right lower quadrant pain: Secondary | ICD-10-CM | POA: Insufficient documentation

## 2023-10-23 DIAGNOSIS — E119 Type 2 diabetes mellitus without complications: Secondary | ICD-10-CM | POA: Diagnosis not present

## 2023-10-23 DIAGNOSIS — D3502 Benign neoplasm of left adrenal gland: Secondary | ICD-10-CM | POA: Diagnosis not present

## 2023-10-23 DIAGNOSIS — R1030 Lower abdominal pain, unspecified: Secondary | ICD-10-CM | POA: Diagnosis not present

## 2023-10-23 LAB — I-STAT VENOUS BLOOD GAS, ED
Acid-Base Excess: 0 mmol/L (ref 0.0–2.0)
Bicarbonate: 24.4 mmol/L (ref 20.0–28.0)
Calcium, Ion: 1.23 mmol/L (ref 1.15–1.40)
HCT: 44 % (ref 36.0–46.0)
Hemoglobin: 15 g/dL (ref 12.0–15.0)
O2 Saturation: 93 %
Patient temperature: 98.3
Potassium: 3.9 mmol/L (ref 3.5–5.1)
Sodium: 138 mmol/L (ref 135–145)
TCO2: 25 mmol/L (ref 22–32)
pCO2, Ven: 37.4 mm[Hg] — ABNORMAL LOW (ref 44–60)
pH, Ven: 7.421 (ref 7.25–7.43)
pO2, Ven: 63 mm[Hg] — ABNORMAL HIGH (ref 32–45)

## 2023-10-23 LAB — CBC
HCT: 44.3 % (ref 36.0–46.0)
Hemoglobin: 14.9 g/dL (ref 12.0–15.0)
MCH: 29.3 pg (ref 26.0–34.0)
MCHC: 33.6 g/dL (ref 30.0–36.0)
MCV: 87 fL (ref 80.0–100.0)
Platelets: 195 10*3/uL (ref 150–400)
RBC: 5.09 MIL/uL (ref 3.87–5.11)
RDW: 14.7 % (ref 11.5–15.5)
WBC: 12 10*3/uL — ABNORMAL HIGH (ref 4.0–10.5)
nRBC: 0 % (ref 0.0–0.2)

## 2023-10-23 LAB — URINALYSIS, ROUTINE W REFLEX MICROSCOPIC
Bilirubin Urine: NEGATIVE
Glucose, UA: NEGATIVE mg/dL
Hgb urine dipstick: NEGATIVE
Ketones, ur: NEGATIVE mg/dL
Leukocytes,Ua: NEGATIVE
Nitrite: NEGATIVE
Protein, ur: NEGATIVE mg/dL
Specific Gravity, Urine: 1.039 — ABNORMAL HIGH (ref 1.005–1.030)
pH: 6 (ref 5.0–8.0)

## 2023-10-23 LAB — COMPREHENSIVE METABOLIC PANEL
ALT: 22 U/L (ref 0–44)
AST: 18 U/L (ref 15–41)
Albumin: 3.8 g/dL (ref 3.5–5.0)
Alkaline Phosphatase: 91 U/L (ref 38–126)
Anion gap: 7 (ref 5–15)
BUN: 19 mg/dL (ref 6–20)
CO2: 22 mmol/L (ref 22–32)
Calcium: 8.5 mg/dL — ABNORMAL LOW (ref 8.9–10.3)
Chloride: 107 mmol/L (ref 98–111)
Creatinine, Ser: 0.89 mg/dL (ref 0.44–1.00)
GFR, Estimated: >60 mL/min (ref >60)
Glucose, Bld: 93 mg/dL (ref 70–99)
Potassium: 4 mmol/L (ref 3.5–5.1)
Sodium: 136 mmol/L (ref 135–145)
Total Bilirubin: 0.3 mg/dL (ref 0.0–1.2)
Total Protein: 6.4 g/dL — ABNORMAL LOW (ref 6.5–8.1)

## 2023-10-23 LAB — CBG MONITORING, ED: Glucose-Capillary: 94 mg/dL (ref 70–99)

## 2023-10-23 LAB — LIPASE, BLOOD: Lipase: 29 U/L (ref 11–51)

## 2023-10-23 MED ORDER — HYDROMORPHONE HCL 1 MG/ML IJ SOLN
0.5000 mg | Freq: Once | INTRAMUSCULAR | Status: AC
  Administered 2023-10-23: 0.5 mg via INTRAVENOUS
  Filled 2023-10-23: qty 1

## 2023-10-23 MED ORDER — METOCLOPRAMIDE HCL 5 MG/ML IJ SOLN
10.0000 mg | Freq: Once | INTRAMUSCULAR | Status: AC
  Administered 2023-10-23: 10 mg via INTRAVENOUS
  Filled 2023-10-23: qty 2

## 2023-10-23 MED ORDER — IOHEXOL 300 MG/ML  SOLN
100.0000 mL | Freq: Once | INTRAMUSCULAR | Status: AC | PRN
  Administered 2023-10-23: 100 mL via INTRAVENOUS

## 2023-10-23 MED ORDER — METOCLOPRAMIDE HCL 10 MG PO TABS
10.0000 mg | ORAL_TABLET | Freq: Three times a day (TID) | ORAL | 0 refills | Status: DC | PRN
Start: 2023-10-23 — End: 2023-10-28

## 2023-10-23 MED ORDER — SODIUM CHLORIDE 0.9 % IV BOLUS
1000.0000 mL | Freq: Once | INTRAVENOUS | Status: AC
  Administered 2023-10-23: 1000 mL via INTRAVENOUS

## 2023-10-23 NOTE — ED Triage Notes (Signed)
 Pt c/o right sided abd pain x 3 weeks, pain worsening today. Having diarrhea. Reports previous dx of diverticulitis

## 2023-10-23 NOTE — ED Notes (Signed)
 Initial contact made. Pt is resting in bed. Pt states she has been having abdominal pain that radiates to the right for the past couple of weeks that has gotten progressively worse but last night became much worse very quickly. Pt denies N/V, endorses loose stool- liquid stool, with pain post bowel movement

## 2023-10-23 NOTE — ED Provider Notes (Signed)
 [place] EMERGENCY DEPARTMENT AT [facility] Provider Note  CSN: [id number] Arrival date & time: 10/23/23 9566  Chief Complaint(s) No chief complaint on file.  HPI Hailey Barnes is a 63 y.o. female with past medical history listed below who presents to the emergency department for 3 weeks of progressively worsening right sided/lower abdominal pain reminding her of her prior diverticulitis.  Patient also reports loose stools.  No bloody bowel movements.  Endorsing nausea without emesis.  No fevers or chills.  No urinary symptoms.  Seen by her PCP 4 days ago and prescribed Augmentin  for possible diverticulitis.  Given lack of improvement, she presented today for evaluation.  The history is provided by the patient.    Past Medical History Past Medical History:  Diagnosis Date   Anxiety    Arthritis    knees (02/21/2014), back   CHF (congestive heart failure) (HCC)    Chronic bronchitis (HCC)    get it q yr (02/21/2014)   Chronic diastolic CHF (congestive heart failure) (HCC)    a. Echo 10/23/2016: EF 75 %   Chronic lower back pain    Complication of anesthesia    I don't come out well; I chew on my tongue   COPD (chronic obstructive pulmonary disease) (HCC)    Depression    Diabetes mellitus without complication (HCC)    TYPE 2   Family history of adverse reaction to anesthesia    BROTHER PONV, had a blood clot several days later and heart stopped   Fatty liver    NONALCOLIC   Fibroid    Heart murmur    Hypertension    Infection of right eye    current dx on Saturday 7/15 - using drops   Leg swelling    bilateral   Migraine 1982-2009   MVP (mitral valve prolapse)    Neuromuscular disorder (HCC)    right leg nerve pain   Shortness of breath    WITH EXERTION   Sinus pause    a. noted on telemetry during admission from 10/2016, lasting up to 4.4 seconds. BB discontinued.    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   D/Dx: carpal tunnel, ulnar neuropathy Less likely: cervical radiculopathy, vasculitis    Patient Active Problem List   Diagnosis Date Noted   Abdominal pain 10/21/2023   Postmenopausal bleeding 05/19/2023   Healthcare maintenance 03/10/2023   Eustachian tube dysfunction, right 03/09/2023   Urinary hesitancy 08/28/2022   Peripheral neuropathy 08/12/2022   UTI due to extended-spectrum beta lactamase (ESBL) producing Escherichia coli 03/16/2022   Breast mass in female 10/23/2021   Melanosis 11/07/2020   Lesion of cervix 07/05/2020   COPD (chronic obstructive pulmonary disease) (HCC) 02/17/2020   Plica of knee, left 08/16/2018   Chondromalacia, left knee 08/16/2018   Primary osteoarthritis of both knees 03/24/2018   Bilateral primary osteoarthritis of first carpometacarpal joints 11/16/2017   Carpal tunnel syndrome, bilateral 10/21/2017   OSA (obstructive sleep apnea) 10/30/2016   Left lumbar radiculopathy 06/02/2016   Hyperlipidemia 02/07/2016   T2DM (type 2 diabetes mellitus) (HCC) 12/07/2015  Depression 04/19/2015   Diminished vision 06/23/2014   (HFpEF) heart failure with preserved ejection fraction (HCC) 06/13/2014   Restrictive lung disease 05/10/2014   Back pain 01/26/2014   NASH (nonalcoholic steatohepatitis) 01/04/2014   Hypertension 05/26/2010   Morbid obesity (HCC) 12/17/2006   Tobacco use disorder 12/17/2006   Home Medication(s) Prior to Admission medications   Medication Sig Start Date End Date Taking? Authorizing Provider  metoCLOPramide  (REGLAN ) 10 MG tablet Take 1 tablet (10 mg total) by mouth every 8 (eight) hours as needed for nausea. 10/23/23  Yes Marlet Korte,  Raynell Moder, MD  Accu-Chek Softclix Lancets lancets Use to check blood sugar once daily. E11.9 07/24/22   Rosendo Rush, MD  allopurinol  (ZYLOPRIM ) 300 MG tablet Take 1 tablet (300 mg total) by mouth daily. 06/23/23   McDiarmid, Krystal BIRCH, MD  amLODipine  (NORVASC ) 2.5 MG tablet TAKE 1 TABLET BY MOUTH AT BEDTIME. 10/29/22   Rosendo Rush, MD  amoxicillin -clavulanate (AUGMENTIN ) 875-125 MG tablet Take 1 tablet by mouth 3 (three) times daily for 10 days. 10/19/23 10/29/23  Jennelle Riis, MD  aspirin  EC 81 MG tablet Take 81 mg by mouth daily.    [provider]  atorvastatin  (LIPITOR) 40 MG tablet TAKE 1 TABLET BY MOUTH EVERY DAY 06/16/23   Rosendo Rush, MD  fluticasone  (FLONASE ) 50 MCG/ACT nasal spray SPRAY 2 SPRAYS INTO EACH NOSTRIL EVERY DAY 09/21/23   Rosendo Rush, MD  glucose blood (ACCU-CHEK AVIVA) test strip Use to check sugar up to 3 times daily 04/14/19   Scarlet Elsie LABOR, MD  hydrALAZINE  (APRESOLINE ) 100 MG tablet Take 1 tablet (100 mg total) by mouth 3 (three) times daily. 05/28/23   Wyn Jackee VEAR Mickey., NP  ibuprofen  (ADVIL ) 800 MG tablet Take 1 tablet (800 mg total) by mouth every 8 (eight) hours as needed for moderate pain. 11/07/22   Haze Lonni PARAS, MD  lidocaine -prilocaine  (EMLA ) cream APPLY 1 APPLICATION TOPICALLY AS NEEDED. FOR UNDER BREAST 12/30/22   Rosendo Rush, MD  losartan  (COZAAR ) 100 MG tablet TAKE 1 TABLET BY MOUTH EVERY DAY 05/04/23   Rosendo Rush, MD  Melaton-Thean-Cham-PassF-LBalm (MELATONIN + L-THEANINE) CAPS Take 2 capsules by mouth at bedtime as needed (sleep).    [provider]  ondansetron  (ZOFRAN -ODT) 8 MG disintegrating tablet Take 1 tablet by mouth every 8 (eight) hours as needed.    [provider]  pindolol  (VISKEN ) 10 MG tablet TAKE 2 TABLETS BY MOUTH 2 TIMES DAILY. 09/10/23   Wyn Jackee VEAR Mickey., NP  Polyvinyl Alcohol -Povidone (CLEAR EYES ALL SEASONS OP) Place 1 drop into both eyes daily as needed (allergies/itchy eyes).    [provider]  spironolactone  (ALDACTONE ) 25 MG tablet TAKE 1 TABLET BY MOUTH EVERYDAY AT BEDTIME 06/18/23   Wyn Jackee VEAR Mickey., NP  torsemide  (DEMADEX ) 20 MG tablet Take 1 tablet (20 mg total) by mouth every other day. Patient taking differently: Take 20 mg by mouth daily. 03/18/22   Cheryle Page, MD  TRELEGY ELLIPTA  200-62.5-25 MCG/ACT AEPB USE AS DIRECTED 1 PUFF IN THE MOUTH OR THROAT DAILY. TAKE 1 PUFF BY MOUTH EVERY DAY 09/16/23   Rosendo Rush, MD  triamcinolone  (KENALOG ) 0.025 % ointment Apply 1 Application topically 2 (two) times daily as needed. 06/23/23   McDiarmid, Krystal BIRCH, MD  Allergies Bupropion  and Norvasc  [amlodipine ]  Review of Systems Review of Systems As noted in HPI  Physical Exam Vital Signs  I have reviewed the triage vital signs BP (!) 157/90   Pulse 77   Temp 98.3 F (36.8 C)   Resp 20   LMP 04/06/2017   SpO2 100%   Physical Exam Vitals reviewed.  Constitutional:      General: She is not in acute distress.    Appearance: She is well-developed. She is obese. She is not diaphoretic.  HENT:     Head: Normocephalic and atraumatic.     Right Ear: External ear normal.     Left Ear: External ear normal.     Nose: Nose normal.  Eyes:     General: No scleral icterus.    Conjunctiva/sclera: Conjunctivae normal.  Neck:     Trachea: Phonation normal.  Cardiovascular:     Rate and Rhythm: Normal rate and regular rhythm.  Pulmonary:     Effort: Pulmonary effort is normal. No respiratory distress.     Breath sounds: No stridor.  Abdominal:     General: There is no distension.     Tenderness: There is abdominal tenderness in the right upper quadrant, right lower quadrant and suprapubic area. There is no guarding or rebound.  Musculoskeletal:        General: Normal range of motion.     Cervical back: Normal range of motion.   Neurological:     Mental Status: She is alert and oriented to person, place, and time.  Psychiatric:        Behavior: Behavior normal.     ED Results and Treatments Labs (all labs ordered are listed, but only abnormal results are displayed) Labs Reviewed  COMPREHENSIVE METABOLIC PANEL - Abnormal; Notable for the following components:      Result Value   Calcium  8.5 (*)    Total Protein 6.4 (*)    All other components within normal limits  CBC - Abnormal; Notable for the following components:   WBC 12.0 (*)    All other components within normal limits  URINALYSIS, ROUTINE W REFLEX MICROSCOPIC - Abnormal; Notable for the following components:   Specific Gravity, Urine 1.039 (*)    All other components within normal limits  I-STAT VENOUS BLOOD GAS, ED - Abnormal; Notable for the following components:   pCO2, Ven 37.4 (*)    pO2, Ven 63 (*)    All other components within normal limits  LIPASE, BLOOD  CBG MONITORING, ED                                                                                                                         EKG  EKG Interpretation Date/Time:    Ventricular Rate:    PR Interval:    QRS Duration:    QT Interval:    QTC Calculation:   R Axis:      Text Interpretation:  Radiology CT ABDOMEN PELVIS W CONTRAST Result Date: 10/23/2023 CLINICAL DATA:  Right lower quadrant abdominal pain. Radiating pain for a couple of weeks that has worsened, especially last night. EXAM: CT ABDOMEN AND PELVIS WITH CONTRAST TECHNIQUE: Multidetector CT imaging of the abdomen and pelvis was performed using the standard protocol following bolus administration of intravenous contrast. RADIATION DOSE REDUCTION: This exam was performed according to the departmental dose-optimization program which includes automated exposure control, adjustment of the mA and/or kV according to patient size and/or use of iterative reconstruction technique. CONTRAST:  OMNIPAQUE   IOHEXOL  300 MG/ML  SOLN COMPARISON:  11/07/2022 FINDINGS: Lower chest:  No contributory findings. Hepatobiliary: Tiny presumed cystic density in the superior right lobe. No interval change or aggressive finding.Cholecystectomy. No CBD dilatation. Pancreas: Unremarkable Spleen: Unremarkable. Adrenals/Urinary Tract: Dominant right and at least 2 left adrenal nodules, up to 3.6 cm on the right, still best attributed to adenoma based on prior noncontrast scan instability. No hydronephrosis or stone. Unremarkable bladder. Stomach/Bowel: No obstruction. No visible bowel inflammation. Distal colonic diverticulosis. Vascular/Lymphatic: No acute vascular abnormality. Atheromatous calcification of the aorta. No mass or adenopathy. Reproductive:Cystic density at the right adnexa with tubular shaped especially suggested on coronal reformats, hydrosalpinx based on ultrasound 05/22/2023. Other: No ascites or pneumoperitoneum. Musculoskeletal: No acute abnormalities. Generalized lumbar spine degeneration especially affecting facets with L4-5 anterolisthesis. Biforaminal stenosis at L4-5 and L5-S1. IMPRESSION: 1. No acute finding.  No bowel obstruction or visible inflammation. 2. Chronic findings include atherosclerosis, adrenal adenomas, colonic diverticulosis, and right hydrosalpinx. Electronically Signed   By: Dorn Roulette M.D.   On: 10/23/2023 07:10    Medications Ordered in ED Medications  sodium chloride  0.9 % bolus 1,000 mL (1,000 mLs Intravenous New Bag/Given 10/23/23 0536)  HYDROmorphone  (DILAUDID ) injection 0.5 mg (0.5 mg Intravenous Given 10/23/23 0531)  metoCLOPramide  (REGLAN ) injection 10 mg (10 mg Intravenous Given 10/23/23 0534)  iohexol  (OMNIPAQUE ) 300 MG/ML solution 100 mL (100 mLs Intravenous Contrast Given 10/23/23 9379)   Procedures Procedures  (including critical care time) Medical Decision Making / ED Course   Medical Decision Making Amount and/or Complexity of Data Reviewed Labs:  ordered. Radiology: ordered.  Risk Prescription drug management.    Right-sided abdominal pain differential considered CBC with leukocytosis.  No anemia. BMP without significant electrolyte derangements or renal sufficiency.  No evidence of bili obstruction or pancreatitis.  No evidence of DKA. UA without evidence of infection.  No hematuria concerning for renal stones.. CT negative for any acute intra-abdominal inflammatory/infectious process such as appendicitis or diverticulitis. Patient provided with IV fluids, IV pain medicine and Reglan  which completely resolved her pain.  She is tolerating p.o.     Final Clinical Impression(s) / ED Diagnoses Final diagnoses:  Right lateral abdominal pain   The patient appears reasonably screened and/or stabilized for discharge and I doubt any other medical condition or other [facility] requiring further screening, evaluation, or treatment in the ED at this time. I have discussed the findings, Dx and Tx plan with the patient/family who expressed understanding and agree(s) with the plan. Discharge instructions discussed at length. The patient/family was given strict return precautions who verbalized understanding of the instructions. No further questions at time of discharge.  Disposition: Discharge  Condition: Good  ED Discharge Orders          Ordered    metoCLOPramide  (REGLAN ) 10 MG tablet  Every 8 hours PRN        10/23/23 0716  Follow Up: Primary care provider  Call  to schedule an appointment for close follow up    This chart was dictated using voice recognition software.  Despite best efforts to proofread,  errors can occur which can change the documentation meaning.    Trine Raynell Moder, MD 10/23/23 0730

## 2023-10-23 NOTE — Discharge Instructions (Addendum)
 Thank you for allowing [place] to take care of you today.  We hope you begin feeling better soon.  To-Do: Please follow-up with your primary doctor. Please return to the Emergency Department or call 911 if you experience chest pain, shortness of breath, severe pain, severe fever, altered mental status, or have any reason to think that you need emergency medical care.  Thank you again.  Hope you feel better soon.  Department of Emergency Medicine [facility]

## 2023-10-28 ENCOUNTER — Emergency Department (HOSPITAL_COMMUNITY): Payer: 59

## 2023-10-28 ENCOUNTER — Ambulatory Visit: Payer: 59 | Admitting: Gastroenterology

## 2023-10-28 ENCOUNTER — Encounter: Payer: Self-pay | Admitting: Gastroenterology

## 2023-10-28 ENCOUNTER — Emergency Department (HOSPITAL_COMMUNITY)
Admission: EM | Admit: 2023-10-28 | Discharge: 2023-10-28 | Discharge status: Against Medical Advice | Payer: 59 | Attending: Emergency Medicine | Admitting: Emergency Medicine

## 2023-10-28 VITALS — BP 136/82 | HR 75 | Ht 62.0 in | Wt 225.0 lb

## 2023-10-28 DIAGNOSIS — R0782 Intercostal pain: Secondary | ICD-10-CM | POA: Diagnosis not present

## 2023-10-28 DIAGNOSIS — R109 Unspecified abdominal pain: Secondary | ICD-10-CM | POA: Insufficient documentation

## 2023-10-28 DIAGNOSIS — Z5321 Procedure and treatment not carried out due to patient leaving prior to being seen by health care provider: Secondary | ICD-10-CM | POA: Diagnosis not present

## 2023-10-28 DIAGNOSIS — M5127 Other intervertebral disc displacement, lumbosacral region: Secondary | ICD-10-CM | POA: Diagnosis not present

## 2023-10-28 DIAGNOSIS — Z860100 Personal history of colon polyps, unspecified: Secondary | ICD-10-CM | POA: Diagnosis not present

## 2023-10-28 DIAGNOSIS — R0781 Pleurodynia: Secondary | ICD-10-CM

## 2023-10-28 DIAGNOSIS — R1011 Right upper quadrant pain: Secondary | ICD-10-CM | POA: Diagnosis not present

## 2023-10-28 DIAGNOSIS — M47816 Spondylosis without myelopathy or radiculopathy, lumbar region: Secondary | ICD-10-CM | POA: Diagnosis not present

## 2023-10-28 DIAGNOSIS — M5136 Other intervertebral disc degeneration, lumbar region with discogenic back pain only: Secondary | ICD-10-CM | POA: Diagnosis not present

## 2023-10-28 DIAGNOSIS — Z8719 Personal history of other diseases of the digestive system: Secondary | ICD-10-CM | POA: Diagnosis not present

## 2023-10-28 DIAGNOSIS — M545 Low back pain, unspecified: Secondary | ICD-10-CM | POA: Insufficient documentation

## 2023-10-28 DIAGNOSIS — M4316 Spondylolisthesis, lumbar region: Secondary | ICD-10-CM | POA: Diagnosis not present

## 2023-10-28 MED ORDER — CYCLOBENZAPRINE HCL 5 MG PO TABS: 5.0000 mg | ORAL_TABLET | Freq: Three times a day (TID) | ORAL | 0 refills | Status: AC | PRN

## 2023-10-28 MED ORDER — SUFLAVE 178.7 G PO SOLR: 1.0000 | Freq: Once | ORAL | 0 refills | Status: AC

## 2023-10-28 MED ORDER — METOCLOPRAMIDE HCL 10 MG PO TABS
10.0000 mg | ORAL_TABLET | ORAL | Status: AC | PRN
Start: 2023-10-28 — End: ?

## 2023-10-28 NOTE — ED Triage Notes (Signed)
 Pt reports that she has had right lower back/flank pain x 1 month. She felt like pain was getting better over last 3 days, but this morning she woke up with pain 10/10. Denies dysuria.  Denies abdominal pain. Lying down is only thing that offers any relief.

## 2023-10-28 NOTE — ED Notes (Signed)
 Pt seen leaving by EMT first.

## 2023-10-28 NOTE — Patient Instructions (Addendum)
 We have sent the following medications to your pharmacy for you to pick up at your convenience: Flexeril  5 mg: Take every 8 hours as needed  Use Reglan  only as needed  Use over-the-counter Voltaren  gel as needed. ________________________________________________________  Rosine have been scheduled for a colonoscopy. Please follow written instructions given to you at your visit today.   Please pick up your prep supplies at the pharmacy within the next 1-3 days.  If you use inhalers (even only as needed), please bring them with you on the day of your procedure.  DO NOT TAKE 7 DAYS PRIOR TO TEST- Trulicity (dulaglutide) Ozempic , Wegovy  (semaglutide ) Mounjaro (tirzepatide) Bydureon Bcise (exanatide extended release)  DO NOT TAKE 1 DAY PRIOR TO YOUR TEST Rybelsus  (semaglutide ) Adlyxin (lixisenatide) Victoza  (liraglutide ) Byetta (exanatide) _______________________________________________________________  Thank you for entrusting me with your care and for choosing [facility], Dr. Elspeth Naval   If your blood pressure at your visit was 140/90 or greater, please contact your primary care physician to follow up on this. ______________________________________________________  If you are age 24 or older, your body mass index should be between 23-30. Your Body mass index is 41.15 kg/m. If this is out of the aforementioned range listed, please consider follow up with your Primary Care Provider.  If you are age 26 or younger, your body mass index should be between 19-25. Your Body mass index is 41.15 kg/m. If this is out of the aformentioned range listed, please consider follow up with your Primary Care Provider.  ________________________________________________________  The [facility] providers would like to encourage you to use MYCHART to communicate with providers for non-urgent requests or questions.  Due to long hold times on the telephone, sending your provider a message by  [facility] may be a faster and more efficient way to get a response.  Please allow 48 business hours for a response.  Please remember that this is for non-urgent requests.  _______________________________________________________  Due to recent changes in healthcare laws, you may see the results of your imaging and laboratory studies on MyChart before your provider has had a chance to review them.  We understand that in some cases there may be results that are confusing or concerning to you. Not all laboratory results come back in the same time frame and the provider may be waiting for multiple results in order to interpret others.  Please give us  48 hours in order for your provider to thoroughly review all the results before contacting the office for clarification of your results.

## 2023-10-28 NOTE — Progress Notes (Signed)
 HPI :  63 year old female here for an acute visit for abdominal pain.  She has been seen here in the past for abdominal wall pain.  She also has a history of fatty liver, colon polyps, diverticulitis.  She reports for the past month she has had worsening of pain on her right costal margin that radiates into her right upper quadrant and right flank/back.  Pain is there all the time, she has been in persistent pain and very tender to the site since it started.  She does not have any prandial association.  She is eating okay.  She has a history of cholecystectomy.  She has some nausea but no vomiting, no early satiety.  She had pain like this in a similar presentation at the last time she visited us  over a year ago.  It was thought she had abdominal wall pain, she also seen in 2020 for similar symptoms.  She is in quite a bit of discomfort, states it is rated 10 out of 10 currently and exquisitely tender to the touch.  No trauma to the area.  No rash.  She was seen last week at [facility] and they performed a CT scan and labs, both were normal without cause for her symptoms.  At the last visit she was given a Medrol  Dosepak and NSAIDs.  She is not taking any NSAIDs currently.  She does have a history of sciatica and using Tylenol  as needed.  She otherwise states her bowels are fairly regular currently.  She had a colonoscopy last done in 2018 with 1 sessile serrated polyp removed.  Since that time she had a CT scan in January of last year which showed diverticulitis in her left colon, that this the first time she has ever had that.  Treated with antibiotics and improved.   Prior workup: EGD 04/28/2017 - normal, biopsies obtained - biopsies negative for HP Colonoscopy 04/28/2017 - diverticulosis, 5mm polyp - sessile serrated, recall 5 years RUQ US  03/20/17 - 8mm GB polyp, no stones, normal CBD, fatty liver H pylori breath test negative 03/18/17 HIDA 12/04/16 - normal US  abdomen 12/02/16 - no gallstones  or polyps noted, fatty liver CT abdomen / pelvis 06/01/15 - ? Avascular necrosis of femoral head, adrenal adenoma, fatty liver   RUQ US  03/16/2018 - fatty liver, s/p cholecystectomy, normal CBD    US  abdomen 10/15/22: IMPRESSION: 1. Previous cholecystectomy. 2. Slight increased echogenicity throughout the liver is nonspecific but often due to hepatic steatosis. 3. No other abnormalities.   CT abdomen / pelvis 11/07/22: IMPRESSION: 1. Sigmoid diverticulitis without abscess or pneumoperitoneum. 2. Chronic findings are stable from May 2023 and described above.  CT abdomen / pelvis with contrast 10/23/23: Hepatobiliary: Tiny presumed cystic density in the superior right lobe. No interval change or aggressive finding.Cholecystectomy. No CBD dilatation.  IMPRESSION: 1. No acute finding.  No bowel obstruction or visible inflammation. 2. Chronic findings include atherosclerosis, adrenal adenomas, colonic diverticulosis, and right hydrosalpinx.  Past Medical History:  Diagnosis Date   Anxiety    Arthritis    knees (02/21/2014), back   CHF (congestive heart failure) (HCC)    Chronic bronchitis (HCC)    get it q yr (02/21/2014)   Chronic diastolic CHF (congestive heart failure) (HCC)    a. Echo 10/23/2016: EF 75 %   Chronic lower back pain    Complication of anesthesia    I don't come out well; I chew on my tongue   COPD (chronic obstructive pulmonary disease) (  HCC)    Depression    Diabetes mellitus without complication (HCC)    TYPE 2   Family history of adverse reaction to anesthesia    BROTHER PONV, had a blood clot several days later and heart stopped   Fatty liver    NONALCOLIC   Fibroid    Heart murmur    Hypertension    Infection of right eye    current dx on Saturday 7/15 - using drops   Leg swelling    bilateral   Migraine 1982-2009   MVP (mitral valve prolapse)    Neuromuscular disorder (HCC)    right leg nerve pain   Shortness of breath    WITH EXERTION    Sinus pause    a. noted on telemetry during admission from 10/2016, lasting up to 4.4 seconds. BB discontinued.    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   D/Dx: carpal tunnel, ulnar neuropathy Less likely: cervical radiculopathy, vasculitis      Past Surgical History:  Procedure Laterality Date   CARPAL TUNNEL RELEASE Right    CESAREAN SECTION  1982   CHOLECYSTECTOMY N/A 06/17/2017   Procedure: LAPAROSCOPIC CHOLECYSTECTOMY WITH INTRAOPERATIVE CHOLANGIOGRAM;  Surgeon: Belinda Cough, MD;  Location: [facility] OR;  Service: General;  Laterality: N/A;   COLONOSCOPY WITH PROPOFOL  N/A 04/28/2017   Procedure: COLONOSCOPY WITH PROPOFOL ;  Surgeon: Leigh Elspeth Mt, MD;  Location: [facility];  Service: Gastroenterology;  Laterality: N/A;   DILATATION & CURETTAGE/HYSTEROSCOPY WITH MYOSURE N/A 05/06/2016   Procedure: DILATATION & CURETTAGE/HYSTEROSCOPY WITH MYOSURE;  Surgeon: Curlee VEAR Guan, MD;  Location: [facility] ORS;  Service: Gynecology;  Laterality: N/A;  request to follow around 8:45  requests one hour OR time   DILATATION & CURETTAGE/HYSTEROSCOPY WITH MYOSURE N/A 04/03/2017   Procedure: DILATATION & CURETTAGE/HYSTEROSCOPY WITH MYOSURE;  Surgeon: Guan Curlee VEAR, MD;  Location: [facility] ORS;  Service: Gynecology;  Laterality: N/A;   DILATION AND CURETTAGE OF UTERUS     ESOPHAGOGASTRODUODENOSCOPY (EGD) WITH PROPOFOL  N/A 04/28/2017   Procedure: ESOPHAGOGASTRODUODENOSCOPY (EGD) WITH PROPOFOL ;  Surgeon: Leigh Elspeth Mt, MD;  Location: [facility];  Service: Gastroenterology;  Laterality: N/A;   FOOT SURGERY Bilateral    took bones out; put pins in   IR  ABLATE LIVER CRYOABLATION  02/08/2020   IR RADIOLOGIST EVAL & MGMT  02/03/2020   KNEE ARTHROSCOPY Left 08/16/2018   Procedure: ARTHROSCOPY KNEE PARTIAL MEDIAL MENISECTOMY, CHONDROPLASTY MEDIAL AND LATERAL, PLICA RELEASE MEDIAL;  Surgeon: Yvone Rush, MD;  Location: [facility] OR;  Service: Orthopedics;  Laterality: Left;   LEFT AND RIGHT HEART CATHETERIZATION WITH CORONARY ANGIOGRAM N/A 03/31/2014   Procedure: LEFT AND RIGHT HEART CATHETERIZATION WITH CORONARY ANGIOGRAM;  Surgeon: Salena GORMAN Negri, MD;  Location: [facility] CATH LAB;  Service: Cardiovascular;  Laterality: N/A;   MYOMECTOMY  YRS AGO   sonohystogram  04/14/2016   [facility]: intramural fibroid, premenopausal endometrium, right fluid filled tubular structure   Family History  Problem Relation Age of Onset   Hypertension Mother    COPD Mother    Diabetes Father    Hypertension Father    Cancer  Sister        UTERINE???   COPD Sister    Hypertension Sister    Hypertension Sister    Social History   Tobacco Use   Smoking status: Every Day    Current packs/day: 0.50    Average packs/day: 0.5 packs/day for 47.0 years (23.5 ttl pk-yrs)    Types: Cigarettes    Start date: 10/20/1976    Passive exposure: Current   Smokeless tobacco: Never   Tobacco comments:    Previous 1 ppd smoker x > 40 years.  Now smokes 10 cigarettes per day  Vaping Use   Vaping status: Never Used  Substance Use Topics   Alcohol  use: Yes    Comment: occasional   Drug use: Yes    Types: Marijuana    Comment: 03/30/2014 stopped using crack 02/21/2014   Current Outpatient Medications  Medication Sig Dispense Refill   Accu-Chek Softclix Lancets lancets Use to check blood sugar once daily. E11.9 100 each 12   allopurinol  (ZYLOPRIM ) 300 MG tablet Take 1 tablet (300 mg total) by mouth daily. 90 tablet 1   amLODipine  (NORVASC ) 2.5 MG tablet TAKE 1 TABLET BY MOUTH AT BEDTIME. 90 tablet 3   amoxicillin -clavulanate (AUGMENTIN ) 875-125 MG tablet Take 1  tablet by mouth 3 (three) times daily for 10 days. 30 tablet 0   aspirin  EC 81 MG tablet Take 81 mg by mouth daily.     atorvastatin  (LIPITOR) 40 MG tablet TAKE 1 TABLET BY MOUTH EVERY DAY 90 tablet 3   cyclobenzaprine  (FLEXERIL ) 5 MG tablet Take 1 tablet (5 mg total) by mouth every 8 (eight) hours as needed. 30 tablet 0   fluticasone  (FLONASE ) 50 MCG/ACT nasal spray SPRAY 2 SPRAYS INTO EACH NOSTRIL EVERY DAY 48 mL 2   glucose blood (ACCU-CHEK AVIVA) test strip Use to check sugar up to 3 times daily 100 each 12   hydrALAZINE  (APRESOLINE ) 100 MG tablet Take 1 tablet (100 mg total) by mouth 3 (three) times daily. 270 tablet 3   ibuprofen  (ADVIL ) 800 MG tablet Take 1 tablet (800 mg total) by mouth every 8 (eight) hours as needed for moderate pain. 20 tablet 0   lidocaine -prilocaine  (EMLA ) cream APPLY 1 APPLICATION TOPICALLY AS NEEDED. FOR UNDER BREAST 30 g 2   losartan  (COZAAR ) 100 MG tablet TAKE 1 TABLET BY MOUTH EVERY DAY 90 tablet 3   Melaton-Thean-Cham-PassF-LBalm (MELATONIN + L-THEANINE) CAPS Take 2 capsules by mouth at bedtime as needed (sleep).     ondansetron  (ZOFRAN -ODT) 8 MG disintegrating tablet Take 1 tablet by mouth every 8 (eight) hours as needed.     pindolol  (VISKEN ) 10 MG tablet TAKE 2 TABLETS BY MOUTH 2 TIMES DAILY. 360 tablet 3   Polyvinyl Alcohol -Povidone (CLEAR EYES ALL SEASONS OP) Place 1 drop into both eyes daily as needed (allergies/itchy eyes).     spironolactone  (ALDACTONE ) 25 MG tablet TAKE 1 TABLET BY MOUTH EVERYDAY AT BEDTIME 90 tablet 3   SUFLAVE  178.7 g SOLR Take 1 kit by mouth once for 1 dose. 1 each 0   torsemide  (DEMADEX ) 20 MG tablet Take 1 tablet (20 mg total) by mouth every other day. (Patient taking differently: Take 20 mg by mouth daily.)     TRELEGY ELLIPTA  200-62.5-25 MCG/ACT AEPB USE AS DIRECTED 1 PUFF IN THE MOUTH OR THROAT DAILY. TAKE 1 PUFF BY MOUTH EVERY DAY 60 each 3   triamcinolone  (KENALOG ) 0.025 % ointment Apply 1 Application topically 2 (two) times  daily as needed. 75 g  0   metoCLOPramide  (REGLAN ) 10 MG tablet Take 1 tablet (10 mg total) by mouth as needed for nausea.     No current facility-administered medications for this visit.   Allergies  Allergen Reactions   Bupropion  Other (See Comments)    Throat soreness and difficulty swallowing.    Norvasc  [Amlodipine ] Swelling    Significant leg swelling with 10mg . Able to tolerate 2.5mg .      Review of Systems: All systems reviewed and negative except where noted in HPI.   Lab Results  Component Value Date   WBC 12.0 (H) 10/23/2023   HGB 15.0 10/23/2023   HCT 44.0 10/23/2023   MCV 87.0 10/23/2023   PLT 195 10/23/2023    Lab Results  Component Value Date   NA 138 10/23/2023   CL 107 10/23/2023   K 3.9 10/23/2023   CO2 22 10/23/2023   BUN 19 10/23/2023   CREATININE 0.89 10/23/2023   GFRNONAA >60 10/23/2023   CALCIUM  8.5 (L) 10/23/2023   ALBUMIN  3.8 10/23/2023   GLUCOSE 93 10/23/2023    Lab Results  Component Value Date   ALT 22 10/23/2023   AST 18 10/23/2023   GGT 46 03/19/2018   ALKPHOS 91 10/23/2023   BILITOT 0.3 10/23/2023     Physical Exam: BP 136/82   Pulse 75   Ht 5' 2 (1.575 m)   Wt 225 lb (102.1 kg)   LMP 04/06/2017   BMI 41.15 kg/m  Constitutional: Pleasant,well-developed, female in no acute distress. Abdominal: Soft, nondistended, exquisite TTP along R costal margin into flank, RUQ, and R mid back. There are no masses palpable. No hepatomegaly. Extremities: no edema Neurological: Alert and oriented to person place and time. Skin: Skin is warm and dry. No rashes noted. Psychiatric: Normal mood and affect. Behavior is normal.   ASSESSMENT: 63 y.o. female here for assessment of the following  1. Costal margin pain   2. Right upper quadrant abdominal pain   3. History of diverticulitis   4. History of colon polyps    She has had recurrent pain like this over the past few years, recently came back and much worse without any clear  triggers.  Reassured her of recent CT scan and based on this and her exam findings, I think she has nerve impingement/musculoskeletal pain.  She is exquisitely tender.  Will give her some muscle relaxers, start Flexeril  5 mg every 8 hours as needed.  Also recommend some Voltaren  gel or Biofreeze, she will use some Voltaren  gel to the area she has at home.  I will message her PCP about this, she should follow-up with them, I do not feel this is related to her GI tract.  She may need to be referred to pain management for possible trigger point injection.  The ED gave her some Reglan  as needed for nausea, she can use that short-term only if needed but long-term do not recommend she take that, she only has a short supply.  Otherwise she has a history of colon polyps a history of first-time episode of diverticulitis last year.  In this light I am recommending a colonoscopy.  We discussed what this is, risks and benefits of the procedure and anesthesia and she wants to proceed.  Will schedule this to be done in the next 1 to 2 months after she has recovered from her right sided pain.  She agrees.  Of note, history of fatty liver noted, her last imaging study did not show this and her  LFTs are normal  PLAN: - start flexeril  5mg  q 8 hours PRN - stop Reglan  for long term  - trial of Voltaren  gel or biofreeze - she has voltaren  at home and prefers to use that - will relay message to PCP, do not think GI etiology, may need to see pain management, consider trigger point injection - colonoscopy for history of diverticulitis and colon polyps - would do in 1-2 months when recovered from this acute issue  Marcey Naval, MD [facility]

## 2023-11-02 ENCOUNTER — Encounter: Payer: Self-pay | Admitting: Obstetrics and Gynecology

## 2023-11-04 ENCOUNTER — Other Ambulatory Visit: Payer: Self-pay | Admitting: Student

## 2023-11-04 DIAGNOSIS — I10 Essential (primary) hypertension: Secondary | ICD-10-CM

## 2023-12-03 ENCOUNTER — Encounter: Payer: 59 | Admitting: Obstetrics and Gynecology

## 2023-12-07 ENCOUNTER — Encounter: Payer: Self-pay | Admitting: Gastroenterology

## 2023-12-08 DIAGNOSIS — G4733 Obstructive sleep apnea (adult) (pediatric): Secondary | ICD-10-CM | POA: Diagnosis not present

## 2023-12-15 ENCOUNTER — Ambulatory Visit: Payer: 59 | Admitting: Gastroenterology

## 2023-12-15 ENCOUNTER — Encounter: Payer: Self-pay | Admitting: Gastroenterology

## 2023-12-15 VITALS — BP 151/98 | HR 75 | Temp 98.4°F | Resp 17 | Ht 62.0 in | Wt 225.0 lb

## 2023-12-15 DIAGNOSIS — K648 Other hemorrhoids: Secondary | ICD-10-CM

## 2023-12-15 DIAGNOSIS — K635 Polyp of colon: Secondary | ICD-10-CM

## 2023-12-15 DIAGNOSIS — Z860101 Personal history of adenomatous and serrated colon polyps: Secondary | ICD-10-CM | POA: Diagnosis not present

## 2023-12-15 DIAGNOSIS — D125 Benign neoplasm of sigmoid colon: Secondary | ICD-10-CM | POA: Diagnosis not present

## 2023-12-15 DIAGNOSIS — K5730 Diverticulosis of large intestine without perforation or abscess without bleeding: Secondary | ICD-10-CM

## 2023-12-15 DIAGNOSIS — Z1211 Encounter for screening for malignant neoplasm of colon: Secondary | ICD-10-CM | POA: Diagnosis not present

## 2023-12-15 DIAGNOSIS — E119 Type 2 diabetes mellitus without complications: Secondary | ICD-10-CM | POA: Diagnosis not present

## 2023-12-15 DIAGNOSIS — Z860100 Personal history of colon polyps, unspecified: Secondary | ICD-10-CM

## 2023-12-15 DIAGNOSIS — J449 Chronic obstructive pulmonary disease, unspecified: Secondary | ICD-10-CM | POA: Diagnosis not present

## 2023-12-15 MED ORDER — SODIUM CHLORIDE 0.9 % IV SOLN
500.0000 mL | Freq: Once | INTRAVENOUS | Status: DC
Start: 2023-12-15 — End: 2023-12-15

## 2023-12-15 NOTE — Op Note (Signed)
 [facility] Patient Name: Hailey Barnes Procedure Date: 12/15/2023 1:46 PM MRN: [id number] Endoscopist: Viviann Spare P. Adela Lank , MD, [id number] Age: 63 Referring MD:  Date of Birth: [birth year] Gender: Female Account #: [id number] Procedure:                Colonoscopy Indications:              High risk colon cancer surveillance: Personal                            history of colonic polyps - sessile serrated polyp                            removed 04/2017, episode of left sided                            diverticulitis since the last exam Medicines:                Monitored Anesthesia Care Procedure:                Pre-Anesthesia Assessment:                           - Prior to the procedure, a History and Physical                            was performed, and patient medications and                            allergies were reviewed. The patient's tolerance of                            previous anesthesia was also reviewed. The risks                            and benefits of the procedure and the sedation                            options and risks were discussed with the patient.                            All questions were answered, and informed consent                            was obtained. Prior Anticoagulants: The patient has                            taken no anticoagulant or antiplatelet agents. ASA                            Grade Assessment: III - A patient with severe                            systemic disease. After reviewing the risks and  benefits, the patient was deemed in satisfactory                            condition to undergo the procedure.                           After obtaining informed consent, the colonoscope                            was passed under direct vision. Throughout the                            procedure, the patient's blood pressure, pulse, and                            oxygen saturations were  monitored continuously. The                            CF [id number] #[id number] was introduced through the anus                            and advanced to the the cecum, identified by                            appendiceal orifice and ileocecal valve. The                            colonoscopy was performed without difficulty. The                            patient tolerated the procedure well. The quality                            of the bowel preparation was good. The ileocecal                            valve, appendiceal orifice, and rectum were                            photographed. Scope In: 1:57:09 PM Scope Out: 2:16:01 PM Scope Withdrawal Time: 0 hours 12 minutes 40 seconds  Total Procedure Duration: 0 hours 18 minutes 52 seconds  Findings:                 The perianal and digital rectal examinations were                            normal.                           Many small-mouthed diverticula were found in the                            sigmoid colon, with restricted mobility of the  colon in this area.                           Two sessile polyps were found in the sigmoid colon.                            The polyps were 3 to 4 mm in size. These polyps                            were removed with a cold snare. Resection and                            retrieval were complete.                           Internal hemorrhoids were found during retroflexion.                           The exam was otherwise without abnormality. Complications:            No immediate complications. Estimated blood loss:                            Minimal. Estimated Blood Loss:     Estimated blood loss was minimal. Impression:               - Diverticulosis in the sigmoid colon.                           - Two 3 to 4 mm polyps in the sigmoid colon,                            removed with a cold snare. Resected and retrieved.                           - Internal hemorrhoids.                            - The examination was otherwise normal. Recommendation:           - Patient has a contact number available for                            emergencies. The signs and symptoms of potential                            delayed complications were discussed with the                            patient. Return to normal activities tomorrow.                            Written discharge instructions were provided to the                            patient.                           -  Resume previous diet.                           - Continue present medications.                           - Await pathology results. Viviann Spare P. Adela Lank, MD 12/15/2023 2:19:42 PM This report has been signed electronically.

## 2023-12-15 NOTE — Patient Instructions (Addendum)
Resume previous diet Continue present medications Await pathology results Handouts/ information given for polyps, diverticulosis and hemorrhoids  YOU HAD AN ENDOSCOPIC PROCEDURE TODAY AT THE [facility]:   Refer to the procedure report that was given to you for any specific questions about what was found during the examination.  If the procedure report does not answer your questions, please call your gastroenterologist to clarify.  If you requested that your care partner not be given the details of your procedure findings, then the procedure report has been included in a sealed envelope for you to review at your convenience later.  YOU SHOULD EXPECT: Some feelings of bloating in the abdomen. Passage of more gas than usual.  Walking can help get rid of the air that was put into your GI tract during the procedure and reduce the bloating. If you had a lower endoscopy (such as a colonoscopy or flexible sigmoidoscopy) you may notice spotting of blood in your stool or on the toilet paper. If you underwent a bowel prep for your procedure, you may not have a normal bowel movement for a few days.  Please Note:  You might notice some irritation and congestion in your nose or some drainage.  This is from the oxygen used during your procedure.  There is no need for concern and it should clear up in a day or so.  SYMPTOMS TO REPORT IMMEDIATELY:  Following lower endoscopy (colonoscopy or flexible sigmoidoscopy):  Excessive amounts of blood in the stool  Significant tenderness or worsening of abdominal pains  Swelling of the abdomen that is new, acute  Fever of 100F or higher  For urgent or emergent issues, a gastroenterologist can be reached at any hour by calling [phone]. Do not use MyChart messaging for urgent concerns.    DIET:  We do recommend a small meal at first, but then you may proceed to your regular diet.  Drink plenty of fluids but you should avoid alcoholic beverages for 24  hours.  ACTIVITY:  You should plan to take it easy for the rest of today and you should NOT DRIVE or use heavy machinery until tomorrow (because of the sedation medicines used during the test).    FOLLOW UP: Our staff will call the number listed on your records the next business day following your procedure.  We will call around 7:15- 8:00 am to check on you and address any questions or concerns that you may have regarding the information given to you following your procedure. If we do not reach you, we will leave a message.     If any biopsies were taken you will be contacted by phone or by letter within the next 1-3 weeks.  Please call us at [phone] if you have not heard about the biopsies in 3 weeks.    SIGNATURES/CONFIDENTIALITY: You and/or your care partner have signed paperwork which will be entered into your electronic medical record.  These signatures attest to the fact that that the information above on your After Visit Summary has been reviewed and is understood.  Full responsibility of the confidentiality of this discharge information lies with you and/or your care-partner. 

## 2023-12-15 NOTE — Progress Notes (Signed)
 Pt's states no medical or surgical changes since previsit or office visit.

## 2023-12-15 NOTE — Progress Notes (Signed)
 Called to room to assist during endoscopic procedure.  Patient ID and intended procedure confirmed with present staff. Received instructions for my participation in the procedure from the performing physician.

## 2023-12-15 NOTE — Progress Notes (Signed)
 Report to PACU, RN, vss, BBS= Clear.

## 2023-12-15 NOTE — Progress Notes (Signed)
 [facility] History and Physical   Primary Care Physician:  Jerre Simon, MD   Reason for Procedure:   History of colon polyps  Plan:    colonoscopy     HPI: Hailey Barnes is a 63 y.o. female  here for colonoscopy surveillance - last exam 04/2017 - sessile serrated polyp removed. Also with history of left sided diverticulitis last year.   Patient denies any bowel symptoms at this time. No family history of colon cancer known. Otherwise feels well without any cardiopulmonary symptoms.   I have discussed risks / benefits of anesthesia and endoscopic procedure with Hailey Barnes and they wish to proceed with the exams as outlined today.    Past Medical History:  Diagnosis Date   Anxiety    Arthritis    "knees" (02/21/2014), back   CHF (congestive heart failure) (HCC)    Chronic bronchitis (HCC)    "get it q yr" (02/21/2014)   Chronic diastolic CHF (congestive heart failure) (HCC)    a. Echo 10/23/2016: EF 75 %   Chronic lower back pain    Complication of anesthesia    "I don't come out well; I chew on my tongue"   COPD (chronic obstructive pulmonary disease) (HCC)    Depression    Diabetes mellitus without complication (HCC)    TYPE 2   Family history of adverse reaction to anesthesia    BROTHER PONV, had a blood clot several days later and heart stopped   Fatty liver    NONALCOLIC   Fibroid    Heart murmur    Hypertension    Infection of right eye    current dx on Saturday 7/15 - using drops   Leg swelling    bilateral   Migraine 1982-2009   MVP (mitral valve prolapse)    Neuromuscular disorder (HCC)    right leg nerve pain   Shortness of breath    WITH EXERTION   Sinus pause    a. noted on telemetry during admission from 10/2016, lasting up to 4.4 seconds. BB discontinued.    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   D/Dx: carpal tunnel, ulnar neuropathy Less likely: cervical radiculopathy, vasculitis     Past Surgical History:  Procedure Laterality Date   CARPAL TUNNEL RELEASE Right    CESAREAN SECTION  1982   CHOLECYSTECTOMY N/A 06/17/2017   Procedure: LAPAROSCOPIC CHOLECYSTECTOMY WITH INTRAOPERATIVE CHOLANGIOGRAM;  Surgeon: Manus Rudd, MD;  Location: [facility] OR;  Service: General;  Laterality: N/A;   COLONOSCOPY WITH PROPOFOL N/A 04/28/2017   Procedure: COLONOSCOPY WITH PROPOFOL;  Surgeon: Ruffin Frederick, MD;  Location: [facility] END[facility]COPY;  Service: Gastroenterology;  Laterality: N/A;   DILATATION & CURETTAGE/HYSTEROSCOPY WITH MYOSURE N/A 05/06/2016   Procedure: DILATATION & CURETTAGE/HYSTEROSCOPY WITH MYOSURE;  Surgeon: Ok Edwards, MD;  Location: [facility] ORS;  Service: Gynecology;  Laterality: N/A;  request to follow around 8:45  requests one hour OR time   DILATATION & CURETTAGE/HYSTEROSCOPY WITH MYOSURE N/A 04/03/2017   Procedure: DILATATION & CURETTAGE/HYSTEROSCOPY WITH MYOSURE;  Surgeon: Ok Edwards, MD;  Location: [facility] ORS;  Service: Gynecology;  Laterality: N/A;   DILATION AND CURETTAGE OF UTERUS     ESOPHAGOGASTRODUODENOSCOPY (EGD) WITH PROPOFOL N/A 04/28/2017   Procedure: ESOPHAGOGASTRODUODENOSCOPY (EGD) WITH PROPOFOL;  Surgeon: Ruffin Frederick, MD;  Location: [facility];  Service: Gastroenterology;  Laterality: N/A;   FOOT SURGERY Bilateral    "took bones out; put pins in"   IR ABLATE LIVER CRYOABLATION  02/08/2020   IR RADIOLOGIST EVAL & MGMT  02/03/2020   KNEE ARTHROSCOPY Left 08/16/2018   Procedure: ARTHROSCOPY KNEE PARTIAL MEDIAL MENISECTOMY, CHONDROPLASTY MEDIAL AND LATERAL, PLICA RELEASE MEDIAL;  Surgeon: Jodi Geralds, MD;  Location: [facility] OR;   Service: Orthopedics;  Laterality: Left;   LEFT AND RIGHT HEART CATHETERIZATION WITH CORONARY ANGIOGRAM N/A 03/31/2014   Procedure: LEFT AND RIGHT HEART CATHETERIZATION WITH CORONARY ANGIOGRAM;  Surgeon: Ricki Rodriguez, MD;  Location: [facility] CATH LAB;  Service: Cardiovascular;  Laterality: N/A;   MYOMECTOMY  YRS AGO   sonohystogram  04/14/2016   [facility]: intramural fibroid, premenopausal endometrium, right fluid filled tubular structure    Prior to Admission medications   Medication Sig Start Date End Date Taking? Authorizing Provider  Accu-Chek Softclix Lancets lancets Use to check blood sugar once daily. E11.9 07/24/22  Yes Jerre Simon, MD  allopurinol (ZYLOPRIM) 300 MG tablet Take 1 tablet (300 mg total) by mouth daily. 06/23/23  Yes McDiarmid, Leighton Roach, MD  amLODipine (NORVASC) 2.5 MG tablet TAKE 1 TABLET BY MOUTH EVERYDAY AT BEDTIME 11/04/23  Yes Jerre Simon, MD  aspirin EC 81 MG tablet Take 81 mg by mouth daily.   Yes [provider]  atorvastatin (LIPITOR) 40 MG tablet TAKE 1 TABLET BY MOUTH EVERY DAY 06/16/23  Yes Jerre Simon, MD  glucose blood (ACCU-CHEK AVIVA) test strip Use to check sugar up to 3 times daily 04/14/19  Yes Hensel, Santiago Bumpers, MD  hydrALAZINE (APRESOLINE) 100 MG tablet Take 1 tablet (100 mg total) by mouth 3 (three) times daily. 05/28/23  Yes Gaston Islam., NP  losartan (COZAAR) 100 MG tablet TAKE 1 TABLET BY MOUTH EVERY DAY 05/04/23  Yes Jerre Simon, MD  Melaton-Thean-Cham-PassF-LBalm (MELATONIN + L-THEANINE) CAPS Take 2 capsules by mouth at bedtime as needed (sleep).   Yes [provider]  pindolol (VISKEN) 10 MG tablet TAKE 2 TABLETS BY MOUTH 2 TIMES DAILY. 09/10/23  Yes Gaston Islam., NP  spironolactone (ALDACTONE) 25 MG tablet TAKE 1 TABLET BY MOUTH EVERYDAY AT BEDTIME 06/18/23  Yes Gaston Islam., NP  torsemide (DEMADEX) 20 MG tablet Take 1 tablet (20 mg total) by mouth every other day. Patient taking differently:  Take 20 mg by mouth daily. 03/18/22  Yes Glade Lloyd, MD  TRELEGY Salomon Mast 200-62.5-25 MCG/ACT AEPB USE AS DIRECTED 1 PUFF IN THE MOUTH OR THROAT DAILY. TAKE 1 PUFF BY MOUTH EVERY DAY 09/16/23  Yes Jerre Simon, MD  cyclobenzaprine (FLEXERIL) 5 MG tablet Take 1 tablet (5 mg total) by mouth every 8 (eight) hours as needed. 10/28/23   Janell Keeling, Willaim Rayas, MD  fluticasone (FLONASE) 50 MCG/ACT nasal spray SPRAY 2 SPRAYS INTO EACH NOSTRIL EVERY DAY 09/21/23   Jerre Simon, MD  ibuprofen (ADVIL) 800 MG tablet Take 1 tablet (800 mg total) by mouth every 8 (eight) hours as needed for moderate pain. 11/07/22   Gilda Crease, MD  lidocaine-prilocaine (EMLA) cream APPLY 1 APPLICATION TOPICALLY AS NEEDED. FOR UNDER BREAST 12/30/22   Jerre Simon, MD  metoCLOPramide (REGLAN) 10 MG tablet Take 1 tablet (10 mg total)  by mouth as needed for nausea. 10/28/23   Alandra Sando, Willaim Rayas, MD  ondansetron (ZOFRAN-ODT) 8 MG disintegrating tablet Take 1 tablet by mouth every 8 (eight) hours as needed.    [provider]  Polyvinyl Alcohol-Povidone (CLEAR EYES ALL SEASONS OP) Place 1 drop into both eyes daily as needed (allergies/itchy eyes).    [provider]  triamcinolone (KENALOG) 0.025 % ointment Apply 1 Application topically 2 (two) times daily as needed. 06/23/23   McDiarmid, Leighton Roach, MD    Current Outpatient Medications  Medication Sig Dispense Refill   Accu-Chek Softclix Lancets lancets Use to check blood sugar once daily. E11.9 100 each 12   allopurinol (ZYLOPRIM) 300 MG tablet Take 1 tablet (300 mg total) by mouth daily. 90 tablet 1   amLODipine (NORVASC) 2.5 MG tablet TAKE 1 TABLET BY MOUTH EVERYDAY AT BEDTIME 90 tablet 3   aspirin EC 81 MG tablet Take 81 mg by mouth daily.     atorvastatin (LIPITOR) 40 MG tablet TAKE 1 TABLET BY MOUTH EVERY DAY 90 tablet 3   glucose blood (ACCU-CHEK AVIVA) test strip Use to check sugar up to 3 times daily 100 each 12   hydrALAZINE (APRESOLINE) 100 MG  tablet Take 1 tablet (100 mg total) by mouth 3 (three) times daily. 270 tablet 3   losartan (COZAAR) 100 MG tablet TAKE 1 TABLET BY MOUTH EVERY DAY 90 tablet 3   Melaton-Thean-Cham-PassF-LBalm (MELATONIN + L-THEANINE) CAPS Take 2 capsules by mouth at bedtime as needed (sleep).     pindolol (VISKEN) 10 MG tablet TAKE 2 TABLETS BY MOUTH 2 TIMES DAILY. 360 tablet 3   spironolactone (ALDACTONE) 25 MG tablet TAKE 1 TABLET BY MOUTH EVERYDAY AT BEDTIME 90 tablet 3   torsemide (DEMADEX) 20 MG tablet Take 1 tablet (20 mg total) by mouth every other day. (Patient taking differently: Take 20 mg by mouth daily.)     TRELEGY ELLIPTA 200-62.5-25 MCG/ACT AEPB USE AS DIRECTED 1 PUFF IN THE MOUTH OR THROAT DAILY. TAKE 1 PUFF BY MOUTH EVERY DAY 60 each 3   cyclobenzaprine (FLEXERIL) 5 MG tablet Take 1 tablet (5 mg total) by mouth every 8 (eight) hours as needed. 30 tablet 0   fluticasone (FLONASE) 50 MCG/ACT nasal spray SPRAY 2 SPRAYS INTO EACH NOSTRIL EVERY DAY 48 mL 2   ibuprofen (ADVIL) 800 MG tablet Take 1 tablet (800 mg total) by mouth every 8 (eight) hours as needed for moderate pain. 20 tablet 0   lidocaine-prilocaine (EMLA) cream APPLY 1 APPLICATION TOPICALLY AS NEEDED. FOR UNDER BREAST 30 g 2   metoCLOPramide (REGLAN) 10 MG tablet Take 1 tablet (10 mg total) by mouth as needed for nausea.     ondansetron (ZOFRAN-ODT) 8 MG disintegrating tablet Take 1 tablet by mouth every 8 (eight) hours as needed.     Polyvinyl Alcohol-Povidone (CLEAR EYES ALL SEASONS OP) Place 1 drop into both eyes daily as needed (allergies/itchy eyes).     triamcinolone (KENALOG) 0.025 % ointment Apply 1 Application topically 2 (two) times daily as needed. 75 g 0   Current Facility-Administered Medications  Medication Dose Route Frequency Provider Last Rate Last Admin   0.9 %  sodium chloride infusion  500 mL Intravenous Once Yaiza Palazzola, Willaim Rayas, MD        Allergies as of 12/15/2023 - Review Complete 12/15/2023  Allergen Reaction  Noted   Bupropion Other (See Comments) 12/13/2019   Norvasc [amlodipine] Swelling 05/01/2020    Family History  Problem Relation Age of  Onset   Hypertension Mother    COPD Mother    Diabetes Father    Hypertension Father    Cancer Sister        UTERINE???   COPD Sister    Hypertension Sister    Hypertension Sister    Colon cancer Neg Hx    Esophageal cancer Neg Hx    Stomach cancer Neg Hx    Rectal cancer Neg Hx     Social History   Socioeconomic History   Marital status: Divorced    Spouse name: Not on file   Number of children: 1   Years of education: 12   Highest education level: 12th grade  Occupational History   Occupation: Set designer    Comment: disabled   Occupation: patient care assistance  Tobacco Use   Smoking status: Every Day    Current packs/day: 0.50    Average packs/day: 0.5 packs/day for 47.2 years (23.6 ttl pk-yrs)    Types: Cigarettes    Start date: 10/20/1976    Passive exposure: Current   Smokeless tobacco: Never   Tobacco comments:    Previous 1 ppd smoker x > 40 years.  Now smokes 10 cigarettes per day  Vaping Use   Vaping status: Never Used  Substance and Sexual Activity   Alcohol use: Yes    Comment: occasional   Drug use: Yes    Types: Marijuana    Comment: 03/30/2014 "stopped using crack 02/21/2014"   Sexual activity: Yes    Partners: Male    Birth control/protection: Post-menopausal  Other Topics Concern   Not on file  Social History Narrative   Divorced since 2015.   Lives alone. No pets. Daughter lives in [place]. Daughter helps with transportation. Talks daily on phone, sees once or twice a week. 2 grandkids.   Enjoys church at [employer], [place] geared towards people with substance abuse history.    Lives in duplex apartment, one level, three stairs to get in. Has handrails, has grab bars in bathroom. Smoke alarms.   Bakes most meals, avoids starches, rice. Eats vegetables. Drinks tea, ginger ale.    Likes to  go to movies, used to walk daily but not now due to knee pain. Likes to spend time with grandchildren, go out to eat. Spend time with family and circle of friends.    Social Drivers of Corporate investment banker Strain: Low Risk  (04/24/2023)   Overall Financial Resource Strain (CARDIA)    Difficulty of Paying Living Expenses: Not hard at all  Food Insecurity: No Food Insecurity (04/24/2023)   Hunger Vital Sign    Worried About Running Out of Food in the Last Year: Never true    Ran Out of Food in the Last Year: Never true  Transportation Needs: No Transportation Needs (04/24/2023)   PRAPARE - Administrator, Civil Service (Medical): No    Lack of Transportation (Non-Medical): No  Physical Activity: Inactive (04/24/2023)   Exercise Vital Sign    Days of Exercise per Week: 0 days    Minutes of Exercise per Session: 0 min  Stress: No Stress Concern Present (04/24/2023)   [employer] of Occupational Health - Occupational Stress Questionnaire    Feeling of Stress : Not at all  Social Connections: Moderately Isolated (04/24/2023)   Social Connection and Isolation Panel [NHANES]    Frequency of Communication with Friends and Family: Three times a week    Frequency of Social Gatherings with Friends  and Family: Once a week    Attends Religious Services: More than 4 times per year    Active Member of Clubs or Organizations: No    Attends Banker Meetings: Never    Marital Status: Divorced  Catering manager Violence: Not At Risk (04/24/2023)   Humiliation, Afraid, Rape, and Kick questionnaire    Fear of Current or Ex-Partner: No    Emotionally Abused: No    Physically Abused: No    Sexually Abused: No    Review of Systems: All other review of systems negative except as mentioned in the HPI.  Physical Exam: Vital signs BP (!) 151/97   Pulse 68   Temp 98.4 F (36.9 C) (Temporal)   Ht 5\' 2"  (1.575 m)   Wt 225 lb (102.1 kg)   LMP 04/06/2017   SpO2 99%   BMI 41.15  kg/m   General:   Alert,  Well-developed, pleasant and cooperative in NAD Lungs:  Clear throughout to auscultation.   Heart:  Regular rate and rhythm Abdomen:  Soft, nontender and nondistended.   Neuro/Psych:  Alert and cooperative. Normal mood and affect. A and O x 3  Harlin Rain, MD [facility]

## 2023-12-16 ENCOUNTER — Telehealth: Payer: Self-pay | Admitting: *Deleted

## 2023-12-16 NOTE — Telephone Encounter (Signed)
  Follow up Call-     12/15/2023   12:53 PM  Call back number  Post procedure Call Back phone  # [phone]  Permission to leave phone message Yes     Patient questions:  Do you have a fever, pain , or abdominal swelling? No. Pain Score  0 *  Have you tolerated food without any problems? Yes.    Have you been able to return to your normal activities? Yes.    Do you have any questions about your discharge instructions: Diet   No. Medications  No. Follow up visit  No.  Do you have questions or concerns about your Care? No.  Actions: * If pain score is 4 or above: No action needed, pain <4.    Pt states "when I pass gas, I have passed a small amount of blood. No clots."  She states it is getting better and will call back if symptoms worsen

## 2023-12-18 LAB — SURGICAL PATHOLOGY

## 2023-12-22 ENCOUNTER — Encounter: Payer: Self-pay | Admitting: Gastroenterology

## 2023-12-24 ENCOUNTER — Ambulatory Visit: Admitting: Student

## 2023-12-24 VITALS — BP 141/89 | HR 73 | Ht 62.0 in | Wt 227.0 lb

## 2023-12-24 DIAGNOSIS — I10 Essential (primary) hypertension: Secondary | ICD-10-CM

## 2023-12-24 DIAGNOSIS — M25472 Effusion, left ankle: Secondary | ICD-10-CM | POA: Diagnosis not present

## 2023-12-24 DIAGNOSIS — M79671 Pain in right foot: Secondary | ICD-10-CM | POA: Diagnosis not present

## 2023-12-24 DIAGNOSIS — M109 Gout, unspecified: Secondary | ICD-10-CM

## 2023-12-24 DIAGNOSIS — M79672 Pain in left foot: Secondary | ICD-10-CM | POA: Diagnosis not present

## 2023-12-24 MED ORDER — COLCHICINE 0.6 MG PO CAPS: ORAL_CAPSULE | ORAL | 0 refills | Status: DC

## 2023-12-24 MED ORDER — COLCHICINE 0.6 MG PO CAPS
ORAL_CAPSULE | ORAL | 0 refills | Status: DC
Start: 2023-12-24 — End: 2023-12-24

## 2023-12-24 NOTE — Progress Notes (Signed)
    SUBJECTIVE:   CHIEF COMPLAINT / HPI:   Foot pain For past week, both feet (L>R) and lower legs have been painful to touch and walk, warm, swollen and a little red.  Elevation helps a little.  Does not normally have swelling of legs No known injury  Had colonoscopy last week, no other surgeries or procedures No long car rides/plane rides. Has been moving around a lot.  No fevers, chills.  Otherwise feeling well.  HTN Taking amlodipine 2.5 mg daily, spironolactone 25 mg at night, losartan 100 mg at night, hydralazine 100 mg 3 times daily although states she did not take midday dose of hydralazine given she was running errands and did not have it with her.   PERTINENT  PMH / PSH: HFpEF, HTN, T2DM, peripheral neuropathy, Knee OA  OBJECTIVE:   BP (!) 141/89   Pulse 73   Ht 5\' 2"  (1.575 m)   Wt 227 lb (103 kg)   LMP 04/06/2017   SpO2 99%   BMI 41.52 kg/m    General: NAD, pleasant, able to participate in exam Cardiac: RRR, no murmurs. Respiratory: CTAB, normal effort, No wheezes, rales or rhonchi Extremities: No wounds or deformities of feet.  Dorsalis pedis pulses present bilaterally.  Significant warmth and mild erythema bilateral feet, trace pitting edema of left ankle and edema of foot.  See photo. Skin: warm and dry, no rashes noted Neuro: alert, no obvious focal deficits Psych: Normal affect and mood   ASSESSMENT/PLAN:   Pain in both feet Pain and mild erythema and warmth of both feet (L>R) and with mild edema of right foot.  Though colonoscopy is low risk for DVT, can consider as provoking factor.  Need to rule out.  No SOB, chest pain concerning for PE. Significant tenderness to R ankle and foot with redness and erythema concerning for gout flare. Also considered venous insufficiency.  -Trial rx colchicine to treat possible gout flare -Bilateral DVT US -If symptoms persist/worsen, patient to return  Hypertension Uncontrolled in setting of missed dose of  hydralazine.  Asymptomatic. -Continue current regimen as in HPI -Return in 1-2 weeks for BP check     Dr. Erick Alley, DO [facility]

## 2023-12-24 NOTE — Patient Instructions (Signed)
 It was great to see you! Thank you for allowing me to participate in your care!  Our plans for today:  - Go to Ultrasound apt - Take colchicine to treat possible gout flare - If symptoms persist or worsen despite medication, please return    Take care and seek immediate care sooner if you develop any concerns.   Dr. Erick Alley, DO [facility]

## 2023-12-25 ENCOUNTER — Ambulatory Visit (HOSPITAL_COMMUNITY)
Admission: RE | Admit: 2023-12-25 | Discharge: 2023-12-25 | Disposition: A | Discharge status: Home / Self Care | Source: Ambulatory Visit | Attending: Family Medicine | Admitting: Family Medicine

## 2023-12-25 ENCOUNTER — Encounter: Payer: Self-pay | Admitting: Student

## 2023-12-25 ENCOUNTER — Ambulatory Visit (HOSPITAL_COMMUNITY): Admit: 2023-12-25 | Payer: 59 | Admitting: Obstetrics and Gynecology

## 2023-12-25 ENCOUNTER — Telehealth: Payer: Self-pay

## 2023-12-25 DIAGNOSIS — M109 Gout, unspecified: Secondary | ICD-10-CM

## 2023-12-25 DIAGNOSIS — M79671 Pain in right foot: Secondary | ICD-10-CM | POA: Insufficient documentation

## 2023-12-25 DIAGNOSIS — M25472 Effusion, left ankle: Secondary | ICD-10-CM | POA: Insufficient documentation

## 2023-12-25 DIAGNOSIS — M79672 Pain in left foot: Secondary | ICD-10-CM | POA: Diagnosis not present

## 2023-12-25 SURGERY — XI ROBOTIC ASSISTED TOTAL HYSTERECTOMY WITH BILATERAL SALPINGO OOPHORECTOMY
Anesthesia: General

## 2023-12-25 MED ORDER — COLCHICINE 0.6 MG PO CAPS: ORAL_CAPSULE | ORAL | 0 refills | Status: DC

## 2023-12-25 NOTE — Progress Notes (Signed)
 Bilateral lower extremity venous duplex has been completed. Preliminary results can be found in CV Proc through chart review.  Results were faxed to Dr. Pollie Meyer.  12/25/23 11:14 AM Olen Cordial RVT

## 2023-12-25 NOTE — Telephone Encounter (Signed)
 Patient presents to [facility] to discuss Colchicine.   She reports she went to [facility] to pick [facility] up, however they did not have it.   It appears medication was set "to print."   Will resend now.

## 2023-12-25 NOTE — Assessment & Plan Note (Signed)
 Pain and mild erythema and warmth of both feet (L>R) and with mild edema of right foot.  Though colonoscopy is low risk for DVT, can consider as provoking factor.  Need to rule out.  No SOB, chest pain concerning for PE. Significant tenderness to R ankle and foot with redness and erythema concerning for gout flare. Also considered venous insufficiency.  -Trial rx colchicine to treat possible gout flare -Bilateral DVT US -If symptoms persist/worsen, patient to return

## 2023-12-25 NOTE — Assessment & Plan Note (Signed)
 Uncontrolled in setting of missed dose of hydralazine.  Asymptomatic. -Continue current regimen as in HPI -Return in 1-2 weeks for BP check

## 2023-12-29 ENCOUNTER — Encounter: Payer: Self-pay | Admitting: Gastroenterology

## 2023-12-30 ENCOUNTER — Ambulatory Visit (HOSPITAL_COMMUNITY)

## 2023-12-31 ENCOUNTER — Ambulatory Visit (HOSPITAL_COMMUNITY)

## 2024-01-08 ENCOUNTER — Telehealth: Payer: Self-pay

## 2024-01-08 ENCOUNTER — Other Ambulatory Visit: Payer: Self-pay | Admitting: Cardiology

## 2024-01-08 NOTE — Telephone Encounter (Signed)
 Patient calls nurse line requesting a refill on Torsemide.   Patient advised it appears her Cardiologist may be given this to her.   She reports she will give them a call for refill.   Patient to call back with continued concerns.

## 2024-01-08 NOTE — Telephone Encounter (Signed)
*  STAT* If patient is at the pharmacy, call can be transferred to refill team.   1. Which medications need to be refilled? (please list name of each medication and dose if known) torsemide (DEMADEX) 20 MG tablet   2. Would you like to learn more about the convenience, safety, & potential cost savings by using the [facility]? no   3. Are you open to using the [facility] (Type [facility].  ). no   4. Which pharmacy/location (including street and city if local pharmacy) is medication to be sent to? [id number] [place], [place] - [address]   5. Do they need a 30 day or 90 day supply? 90 day

## 2024-01-08 NOTE — Telephone Encounter (Signed)
 Pt is requesting a refill on medication torsemide. Dr. Anne Fu did not prescribe this medication. Would Dr. Anne Fu like to refill this medication? Please address

## 2024-01-09 ENCOUNTER — Other Ambulatory Visit: Payer: Self-pay | Admitting: Student

## 2024-01-09 ENCOUNTER — Other Ambulatory Visit: Payer: Self-pay | Admitting: Family Medicine

## 2024-01-09 DIAGNOSIS — J449 Chronic obstructive pulmonary disease, unspecified: Secondary | ICD-10-CM

## 2024-01-11 MED ORDER — TORSEMIDE 20 MG PO TABS: 20.0000 mg | ORAL_TABLET | ORAL | Status: DC

## 2024-01-11 NOTE — Telephone Encounter (Signed)
 This pt was last seen by Robin Searing PA. She is on Spironolactone and Torsemide. At her last appt Robin Searing PA mentioned the Spironolactone but not the Torsemide. Is pt supposed to be on both? Does Robin Searing PA want to refill the Torsemide? Please address

## 2024-01-11 NOTE — Telephone Encounter (Signed)
 Pt is calling again in regards to the medication she stated she doesn't have any of the medication left. Please advise

## 2024-01-12 ENCOUNTER — Other Ambulatory Visit: Payer: Self-pay

## 2024-01-12 MED ORDER — TORSEMIDE 20 MG PO TABS: 20.0000 mg | ORAL_TABLET | ORAL | 4 refills | Status: DC

## 2024-01-17 ENCOUNTER — Other Ambulatory Visit: Payer: Self-pay | Admitting: Student

## 2024-01-17 DIAGNOSIS — M109 Gout, unspecified: Secondary | ICD-10-CM

## 2024-01-19 DIAGNOSIS — G4733 Obstructive sleep apnea (adult) (pediatric): Secondary | ICD-10-CM | POA: Diagnosis not present

## 2024-01-21 ENCOUNTER — Encounter: Payer: Self-pay | Admitting: Cardiology

## 2024-01-21 ENCOUNTER — Ambulatory Visit: Attending: Cardiology | Admitting: Cardiology

## 2024-01-21 VITALS — BP 150/100 | HR 71 | Ht 62.0 in | Wt 227.0 lb

## 2024-01-21 DIAGNOSIS — I10 Essential (primary) hypertension: Secondary | ICD-10-CM | POA: Diagnosis not present

## 2024-01-21 DIAGNOSIS — I5032 Chronic diastolic (congestive) heart failure: Secondary | ICD-10-CM

## 2024-01-21 DIAGNOSIS — I471 Supraventricular tachycardia, unspecified: Secondary | ICD-10-CM | POA: Diagnosis not present

## 2024-01-21 DIAGNOSIS — E1169 Type 2 diabetes mellitus with other specified complication: Secondary | ICD-10-CM | POA: Diagnosis not present

## 2024-01-21 DIAGNOSIS — E7800 Pure hypercholesterolemia, unspecified: Secondary | ICD-10-CM

## 2024-01-21 NOTE — Patient Instructions (Signed)
 Medication Instructions:  The current medical regimen is effective;  continue present plan and medications.  *If you need a refill on your cardiac medications before your next appointment, please call your pharmacy*  Follow-Up: At [facility], you and your health needs are our priority.  As part of our continuing mission to provide you with exceptional heart care, our providers are all part of one team.  This team includes your primary Cardiologist (physician) and Advanced Practice Providers or APPs (Physician Assistants and Nurse Practitioners) who all work together to provide you with the care you need, when you need it.  Your next appointment:   6 month(s)  Provider:   Robin Searing, NP          We recommend signing up for the patient portal called "MyChart".  Sign up information is provided on this After Visit Summary.  MyChart is used to connect with patients for Virtual Visits (Telemedicine).  Patients are able to view lab/test results, encounter notes, upcoming appointments, etc.  Non-urgent messages can be sent to your provider as well.   To learn more about what you can do with MyChart, go to [email or web address].      1st Floor: - Lobby - Registration  - Pharmacy  - Lab - Cafe  2nd Floor: - PV Lab - Diagnostic Testing (echo, CT, nuclear med)  3rd Floor: - Vacant  4th Floor: - TCTS (cardiothoracic surgery) - AFib Clinic - Structural Heart Clinic - Vascular Surgery  - Vascular Ultrasound  5th Floor: - HeartCare Cardiology (general and EP) - Clinical Pharmacy for coumadin, hypertension, lipid, weight-loss medications, and med management appointments    Valet parking services will be available as well.

## 2024-01-21 NOTE — Progress Notes (Signed)
 Cardiology Office Note:  .   Date:  01/21/2024  ID:  Hailey Barnes April 13, 1961, MRN [id number] PCP: Jerre Simon, MD  [facility] Providers Cardiologist:  Donato Schultz, MD    History of Present Illness: .   Hailey Barnes is a 63 y.o. female Discussed the use of AI scribe software for clinical note transcription with the patient, who gave verbal consent to proceed.  History of Present Illness The patient is a 63 year old with type two diabetes, hypertension, chronic bronchitis, and chronic diastolic heart failure who presents for follow-up.  Hypertension remains a significant concern, with recent elevated readings potentially linked to non-adherence to her medication regimen. She often misses her nighttime dose of hydralazine due to fatigue and recently ran out of her diuretic medication, torsemide, due to prescription issues. Her current medications include amlodipine 2.5 mg daily, atorvastatin 40 mg daily, hydralazine 100 mg three times a day, losartan 100 mg daily, pindolol 20 mg twice a day, spironolactone 25 mg daily, and torsemide 20 mg daily. She is being followed by her primary care physician for blood pressure management.  She has chronic diastolic heart failure with preserved ejection fraction, noted at 75% on an echocardiogram in 2018. Her current medication regimen includes multiple antihypertensives and diuretics to manage symptoms and prevent exacerbations. 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  She has nonobstructive coronary artery disease, diagnosed from a CT coronary angiogram on January 17, 2021, with a calcium score of zero. A prior Zio monitor in August 2024 showed no atrial fibrillation, occasional PACs and PVCs, and three brief runs of atrial tachycardia, which was overall reassuring.  She continues to smoke and acknowledges the difficulty in quitting, stating  'it is impossible' at this time. Despite this, she is encouraged to continue her efforts in weight loss and increased physical activity.     Studies Reviewed: .        Results RADIOLOGY CT Coronary: Calcium score 0, nonobstructive coronary disease, dilated pulmonary artery 35 mm suggestive of pulmonary hypertension (01/17/2021)  DIAGNOSTIC Echocardiogram: Ejection fraction 75% (2018) Zio Monitor: No atrial fibrillation, occasional premature atrial contractions (PACs) and premature ventricular contractions (PVCs), three brief runs of atrial tachycardia (05/2023)  Creat 0.9, LDL 82, Hgb 15, TSH 1.8  Risk Assessment/Calculations:           Physical Exam:   VS:  BP (!) 150/100   Pulse 71   Ht 5\' 2"  (1.575 m)   Wt 227 lb (103 kg)   LMP 04/06/2017   SpO2 91%   BMI 41.52 kg/m    Wt Readings from Last 3 Encounters:  01/21/24 227 lb (103 kg)  12/24/23 227 lb (103 kg)  12/15/23 225 lb (102.1 kg)    GEN: Well nourished, well developed in no acute distress NECK: No JVD; No carotid bruits CARDIAC: RRR, no murmurs, no rubs, no gallops RESPIRATORY:  Clear to auscultation without rales, wheezing or rhonchi  ABDOMEN: Soft, non-tender, non-distended EXTREMITIES:  No edema; No deformity   ASSESSMENT AND PLAN: .    Assessment and Plan Assessment & Plan Hypertension Hypertension remains elevated due to non-adherence to nighttime hydralazine and recent lapse in diuretic medication. Current medications include amlodipine, hydralazine, losartan, spironolactone, and torsemide. Blood pressure management is crucial given her cardiovascular disease history. Emphasized the importance of medication adherence for improved control. - Encourage adherence to nighttime hydralazine dose - Advise taking  spironolactone in the morning - Ensure prescription for torsemide is filled and taken regularly - Monitor blood pressure at home - Follow up with primary care physician for blood pressure  management  Chronic Diastolic Heart Failure Chronic diastolic heart failure with preserved ejection fraction (75% on echocardiogram in 2018). Reports improved mobility and weight loss, beneficial for heart failure management. Current medications include spironolactone and torsemide. - Continue current heart failure management regimen - Encourage continued weight loss and increased mobility  Nonobstructive Coronary Artery Disease Nonobstructive coronary artery disease diagnosed via CT coronary angiogram on 01/17/2021 with a calcium score of zero. No symptoms suggestive of ischemia. On atorvastatin for hyperlipidemia management. Continued statin therapy is important for cardiovascular risk reduction. - Continue atorvastatin 40 mg daily  Pulmonary Hypertension Pulmonary artery dilation at 35 mm suggestive of pulmonary hypertension. No acute symptoms reported. Discussed the importance of symptom monitoring and potential future interventions if symptoms develop. - Monitor for symptoms of pulmonary hypertension  Tobacco Use Tobacco use acknowledged with difficulty in quitting. Smoking cessation is important for cardiovascular health. Discussed readiness to quit and potential strategies for cessation. - Encourage smoking cessation and discuss potential strategies when ready  Follow-up Plan to ensure blood pressure control and overall cardiovascular health management. Discussed potential discharge from cardiology clinic if blood pressure is well-controlled. - Schedule follow-up appointment in 6 months with cardiology clinic, Alden Server NP - Consider discharge from cardiology clinic to primary care if blood pressure is well-controlled          Signed, Donato Schultz, MD

## 2024-01-26 ENCOUNTER — Other Ambulatory Visit: Payer: Self-pay | Admitting: Family Medicine

## 2024-01-26 DIAGNOSIS — E790 Hyperuricemia without signs of inflammatory arthritis and tophaceous disease: Secondary | ICD-10-CM

## 2024-02-02 ENCOUNTER — Ambulatory Visit (INDEPENDENT_AMBULATORY_CARE_PROVIDER_SITE_OTHER)

## 2024-02-02 ENCOUNTER — Ambulatory Visit (INDEPENDENT_AMBULATORY_CARE_PROVIDER_SITE_OTHER): Admitting: Sports Medicine

## 2024-02-02 VITALS — BP 138/88 | HR 78 | Ht 62.0 in | Wt 222.0 lb

## 2024-02-02 DIAGNOSIS — M19011 Primary osteoarthritis, right shoulder: Secondary | ICD-10-CM | POA: Diagnosis not present

## 2024-02-02 DIAGNOSIS — G8929 Other chronic pain: Secondary | ICD-10-CM

## 2024-02-02 DIAGNOSIS — M25511 Pain in right shoulder: Secondary | ICD-10-CM | POA: Diagnosis not present

## 2024-02-02 DIAGNOSIS — M4802 Spinal stenosis, cervical region: Secondary | ICD-10-CM | POA: Diagnosis not present

## 2024-02-02 DIAGNOSIS — M50222 Other cervical disc displacement at C5-C6 level: Secondary | ICD-10-CM | POA: Diagnosis not present

## 2024-02-02 DIAGNOSIS — M542 Cervicalgia: Secondary | ICD-10-CM

## 2024-02-02 DIAGNOSIS — M778 Other enthesopathies, not elsewhere classified: Secondary | ICD-10-CM | POA: Diagnosis not present

## 2024-02-02 DIAGNOSIS — M4312 Spondylolisthesis, cervical region: Secondary | ICD-10-CM | POA: Diagnosis not present

## 2024-02-02 DIAGNOSIS — M5030 Other cervical disc degeneration, unspecified cervical region: Secondary | ICD-10-CM

## 2024-02-02 MED ORDER — MELOXICAM 15 MG PO TABS: 15.0000 mg | ORAL_TABLET | Freq: Every day | ORAL | 0 refills | Status: AC

## 2024-02-02 NOTE — Patient Instructions (Signed)
-   Start meloxicam 15 mg daily x2 weeks.  If still having pain after 2 weeks, complete 3rd-week of NSAID. May use remaining NSAID as needed once daily for pain control.  Do not to use additional over-the-counter NSAIDs (ibuprofen, naproxen, Advil, Aleve, etc.) while taking prescription NSAIDs.  May use Tylenol [phone] mg 2 to 3 times a day for breakthrough pain. Neck and shoulder HEP PT referral  4 week follow up

## 2024-02-02 NOTE — Progress Notes (Signed)
 [name] D.[facility] [address] [place] [address] Phone: [phone]   Assessment and Plan:     1. Neck pain 2. DDD (degenerative disc disease), cervical -Chronic with exacerbation, initial visit - Most consistent with cervical DDD leading to related muscular pain without radicular symptoms - X-ray obtained in clinic.  My interpretation: No acute fracture or dislocation.  Significant degenerative changes through cervical spine, most prominent at C4, C5, C6, C7 - Start HEP and physical therapy for neck - Start meloxicam 15 mg daily x2 weeks.  If still having pain after 2 weeks, complete 3rd-week of NSAID. May use remaining NSAID as needed once daily for pain control.  Do not to use additional over-the-counter NSAIDs (ibuprofen, naproxen, Advil, Aleve, etc.) while taking prescription NSAIDs.  May use Tylenol [phone] mg 2 to 3 times a day for breakthrough pain.  3. Chronic right shoulder pain 4. Pain in right acromioclavicular joint -Chronic with exacerbation, initial sports medicine visit - Pain primarily at right [facility] joint with concern for right AC joint separation.  Pain has been off and on for years since catching herself while falling, and more consistent over the past several months without new MOI - X-ray obtained in clinic.  My interpretation: No acute fracture.  Large distance between the acromion and distal clavicle with degenerative changes.  Small inferior glenoid spurring and humeral head spurring - Start HEP and physical therapy for shoulder - Start meloxicam 15 mg daily x2 weeks.  If still having pain after 2 weeks, complete 3rd-week of NSAID. May use remaining NSAID as needed once daily for pain control.  Do not to use additional over-the-counter NSAIDs (ibuprofen, naproxen, Advil, Aleve, etc.) while taking prescription NSAIDs.  May use Tylenol [phone] mg 2 to 3 times a day for breakthrough pain.    15 additional minutes spent  for educating Therapeutic Home Exercise Program.  This included exercises focusing on stretching, strengthening, with focus on eccentric aspects.   Long term goals include an improvement in range of motion, strength, endurance as well as avoiding reinjury. Patient's frequency would include in 1-2 times a day, 3-5 times a week for a duration of 6-12 weeks. Proper technique shown and discussed handout in great detail with ATC.  All questions were discussed and answered.    Pertinent previous records reviewed include none  Follow Up: 4 weeks for reevaluation.  If no improvement or worsening of symptoms, could consider advanced imaging   Subjective:   I, Hailey Barnes, am serving as a Neurosurgeon for Doctor Richardean Sale  Chief Complaint: neck and shoulder pain  HPI:   02/02/24 Patient is a  63 year old female with neck and  R shoulder pain. Patient states decreased ROM due to pain. She has an audible pop when she moves her neck and R shoulder. Pain for about 2 months. Ibu and tylenol for the pain and that does not help. She not able to sleep through the night . No MOI. No numbness or tingling. A lot of tightness    Relevant Historical Information: DM type II, hypertension, COPD  Additional pertinent review of systems negative.   Current Outpatient Medications:    Accu-Chek Softclix Lancets lancets, Use to check blood sugar once daily. E11.9, Disp: 100 each, Rfl: 12   allopurinol (ZYLOPRIM) 300 MG tablet, TAKE 1 TABLET BY MOUTH EVERY DAY, Disp: 90 tablet, Rfl: 1   amLODipine (NORVASC) 2.5 MG tablet, TAKE 1 TABLET BY MOUTH EVERYDAY AT  BEDTIME, Disp: 90 tablet, Rfl: 3   aspirin EC 81 MG tablet, Take 81 mg by mouth daily., Disp: , Rfl:    atorvastatin (LIPITOR) 40 MG tablet, TAKE 1 TABLET BY MOUTH EVERY DAY, Disp: 90 tablet, Rfl: 3   Colchicine 0.6 MG CAPS, Take 1 capsule (0.6 mg total) by mouth daily. Take 2 tablets for the first dose, followed by one tablet an hour later. 12 hours later, take  one tablet daily until gout flare resolves., Disp: 90 capsule, Rfl: 1   cyclobenzaprine (FLEXERIL) 5 MG tablet, Take 1 tablet (5 mg total) by mouth every 8 (eight) hours as needed., Disp: 30 tablet, Rfl: 0   fluticasone (FLONASE) 50 MCG/ACT nasal spray, SPRAY 2 SPRAYS INTO EACH NOSTRIL EVERY DAY, Disp: 48 mL, Rfl: 2   glucose blood (ACCU-CHEK AVIVA) test strip, Use to check sugar up to 3 times daily, Disp: 100 each, Rfl: 12   hydrALAZINE (APRESOLINE) 100 MG tablet, Take 1 tablet (100 mg total) by mouth 3 (three) times daily., Disp: 270 tablet, Rfl: 3   ibuprofen (ADVIL) 800 MG tablet, Take 1 tablet (800 mg total) by mouth every 8 (eight) hours as needed for moderate pain., Disp: 20 tablet, Rfl: 0   lidocaine-prilocaine (EMLA) cream, APPLY 1 APPLICATION TOPICALLY AS NEEDED. FOR UNDER BREAST, Disp: 30 g, Rfl: 2   losartan (COZAAR) 100 MG tablet, TAKE 1 TABLET BY MOUTH EVERY DAY, Disp: 90 tablet, Rfl: 3   Melaton-Thean-Cham-PassF-LBalm (MELATONIN + L-THEANINE) CAPS, Take 2 capsules by mouth at bedtime as needed (sleep)., Disp: , Rfl:    meloxicam (MOBIC) 15 MG tablet, Take 1 tablet (15 mg total) by mouth daily., Disp: 30 tablet, Rfl: 0   metoCLOPramide (REGLAN) 10 MG tablet, Take 1 tablet (10 mg total) by mouth as needed for nausea., Disp: , Rfl:    ondansetron (ZOFRAN-ODT) 8 MG disintegrating tablet, Take 1 tablet by mouth every 8 (eight) hours as needed., Disp: , Rfl:    pindolol (VISKEN) 10 MG tablet, TAKE 2 TABLETS BY MOUTH 2 TIMES DAILY., Disp: 360 tablet, Rfl: 3   Polyvinyl Alcohol-Povidone (CLEAR EYES ALL SEASONS OP), Place 1 drop into both eyes daily as needed (allergies/itchy eyes)., Disp: , Rfl:    spironolactone (ALDACTONE) 25 MG tablet, TAKE 1 TABLET BY MOUTH EVERYDAY AT BEDTIME, Disp: 90 tablet, Rfl: 3   torsemide (DEMADEX) 20 MG tablet, Take 1 tablet (20 mg total) by mouth every other day., Disp: 30 tablet, Rfl: 4   TRELEGY ELLIPTA 200-62.5-25 MCG/ACT AEPB, USE AS DIRECTED 1 PUFF IN THE  MOUTH OR THROAT DAILY. TAKE 1 PUFF BY MOUTH EVERY DAY, Disp: 60 each, Rfl: 3   triamcinolone (KENALOG) 0.025 % ointment, APPLY 1 APPLICATION TOPICALLY TWICE A DAY AS NEEDED, Disp: 75 g, Rfl: 0   Objective:     Vitals:   02/02/24 1307  BP: 138/88  Pulse: 78  SpO2: 97%  Weight: 222 lb (100.7 kg)  Height: 5\' 2"  (1.575 m)      Body mass index is 40.6 kg/m.    Physical Exam:    Neck Exam: Cervical Spine- Posture normal Skin- normal, intact  Neuro:  Strength-  Right Left   Deltoid (C5) 5/5 5/5  Bicep/Brachioradialis (C5/6) 5/5  5/5  Wrist Extension (C6) 5/5 5/5  Tricep (C7) 5/5 5/5  Wrist Flexion (C7) 5/5 5/5  Grip (C8) 5/5 5/5  Finger Abduction (T1) 5/5 5/5   Sensation: intact to light touch in upper extremities bilaterally  Spurling's:  negative bilaterally Neck  ROM: Full active ROM TTP: Cervicals paraspinal, trapezius NTTP: cervical spinous processes,   thoracic paraspinal,   Gen: Appears well, nad, nontoxic and pleasant Neuro:sensation intact, strength is 5/5 with df/pf/inv/ev, muscle tone wnl Skin: no suspicious lesion or defmority Psych: A&O, appropriate mood and affect  Right shoulder:  No deformity, swelling or muscle wasting Significant movement with palpation of distal clavicle with surrounding TTP No scapular winging FF 180, abd 180, int 0, ext 90 TTP AC NTTP over the [place], clavicle,  coracoid, biceps groove, humerus, deltoid, trapezius, cervical spine Positive Hawkins, empty can, O'Brien, crossarm Neg neer, subscap liftoff, speeds Neg ant drawer, sulcus sign, apprehension Negative Spurling's test bilat FROM of neck    Electronically signed by:  Marshall Skeeter D.[facility] 3:26 PM 02/02/24

## 2024-02-08 ENCOUNTER — Encounter: Payer: Self-pay | Admitting: Sports Medicine

## 2024-02-29 NOTE — Progress Notes (Deleted)
 Ben Jackson D.[facility] [address] [place] [address] Phone: [phone]   Assessment and Plan:     There are no diagnoses linked to this encounter.  ***   Pertinent previous records reviewed include ***    Follow Up: ***     Subjective:   I, Inman Fettig, am serving as a Neurosurgeon for Doctor Ulysees Gander   Chief Complaint: neck and shoulder pain   HPI:    02/02/24 Patient is a  63 year old female with neck and  R shoulder pain. Patient states decreased ROM due to pain. She has an audible pop when she moves her neck and R shoulder. Pain for about 2 months. Ibu and tylenol  for the pain and that does not help. She not able to sleep through the night . No MOI. No numbness or tingling. A lot of tightness    03/01/2024 Patient states   Relevant Historical Information: DM type II, hypertension, COPD  Additional pertinent review of systems negative.   Current Outpatient Medications:    Accu-Chek Softclix Lancets lancets, Use to check blood sugar once daily. E11.9, Disp: 100 each, Rfl: 12   allopurinol  (ZYLOPRIM ) 300 MG tablet, TAKE 1 TABLET BY MOUTH EVERY DAY, Disp: 90 tablet, Rfl: 1   amLODipine  (NORVASC ) 2.5 MG tablet, TAKE 1 TABLET BY MOUTH EVERYDAY AT BEDTIME, Disp: 90 tablet, Rfl: 3   aspirin  EC 81 MG tablet, Take 81 mg by mouth daily., Disp: , Rfl:    atorvastatin  (LIPITOR) 40 MG tablet, TAKE 1 TABLET BY MOUTH EVERY DAY, Disp: 90 tablet, Rfl: 3   Colchicine  0.6 MG CAPS, Take 1 capsule (0.6 mg total) by mouth daily. Take 2 tablets for the first dose, followed by one tablet an hour later. 12 hours later, take one tablet daily until gout flare resolves., Disp: 90 capsule, Rfl: 1   cyclobenzaprine  (FLEXERIL ) 5 MG tablet, Take 1 tablet (5 mg total) by mouth every 8 (eight) hours as needed., Disp: 30 tablet, Rfl: 0   fluticasone  (FLONASE ) 50 MCG/ACT nasal spray, SPRAY 2 SPRAYS INTO EACH NOSTRIL EVERY DAY, Disp: 48 mL, Rfl: 2    glucose blood (ACCU-CHEK AVIVA) test strip, Use to check sugar up to 3 times daily, Disp: 100 each, Rfl: 12   hydrALAZINE  (APRESOLINE ) 100 MG tablet, Take 1 tablet (100 mg total) by mouth 3 (three) times daily., Disp: 270 tablet, Rfl: 3   ibuprofen  (ADVIL ) 800 MG tablet, Take 1 tablet (800 mg total) by mouth every 8 (eight) hours as needed for moderate pain., Disp: 20 tablet, Rfl: 0   lidocaine -prilocaine  (EMLA ) cream, APPLY 1 APPLICATION TOPICALLY AS NEEDED. FOR UNDER BREAST, Disp: 30 g, Rfl: 2   losartan  (COZAAR ) 100 MG tablet, TAKE 1 TABLET BY MOUTH EVERY DAY, Disp: 90 tablet, Rfl: 3   Melaton-Thean-Cham-PassF-LBalm (MELATONIN + L-THEANINE) CAPS, Take 2 capsules by mouth at bedtime as needed (sleep)., Disp: , Rfl:    meloxicam  (MOBIC ) 15 MG tablet, Take 1 tablet (15 mg total) by mouth daily., Disp: 30 tablet, Rfl: 0   metoCLOPramide  (REGLAN ) 10 MG tablet, Take 1 tablet (10 mg total) by mouth as needed for nausea., Disp: , Rfl:    ondansetron  (ZOFRAN -ODT) 8 MG disintegrating tablet, Take 1 tablet by mouth every 8 (eight) hours as needed., Disp: , Rfl:    pindolol  (VISKEN ) 10 MG tablet, TAKE 2 TABLETS BY MOUTH 2 TIMES DAILY., Disp: 360 tablet, Rfl: 3   Polyvinyl Alcohol -Povidone (CLEAR EYES ALL SEASONS  OP), Place 1 drop into both eyes daily as needed (allergies/itchy eyes)., Disp: , Rfl:    spironolactone  (ALDACTONE ) 25 MG tablet, TAKE 1 TABLET BY MOUTH EVERYDAY AT BEDTIME, Disp: 90 tablet, Rfl: 3   torsemide  (DEMADEX ) 20 MG tablet, Take 1 tablet (20 mg total) by mouth every other day., Disp: 30 tablet, Rfl: 4   TRELEGY ELLIPTA  200-62.5-25 MCG/ACT AEPB, USE AS DIRECTED 1 PUFF IN THE MOUTH OR THROAT DAILY. TAKE 1 PUFF BY MOUTH EVERY DAY, Disp: 60 each, Rfl: 3   triamcinolone  (KENALOG ) 0.025 % ointment, APPLY 1 APPLICATION TOPICALLY TWICE A DAY AS NEEDED, Disp: 75 g, Rfl: 0   Objective:     There were no vitals filed for this visit.    There is no height or weight on file to calculate BMI.     Physical Exam:    ***   Electronically signed by:  Marshall Skeeter D.[facility] 7:35 AM 02/29/24

## 2024-03-01 ENCOUNTER — Ambulatory Visit: Admitting: Sports Medicine

## 2024-04-01 DIAGNOSIS — Z1231 Encounter for screening mammogram for malignant neoplasm of breast: Secondary | ICD-10-CM | POA: Diagnosis not present

## 2024-04-01 LAB — HM MAMMOGRAPHY

## 2024-04-04 ENCOUNTER — Telehealth: Payer: Self-pay | Admitting: Obstetrics and Gynecology

## 2024-04-04 ENCOUNTER — Encounter: Payer: Self-pay | Admitting: Student

## 2024-04-04 DIAGNOSIS — E2839 Other primary ovarian failure: Secondary | ICD-10-CM

## 2024-04-04 NOTE — Telephone Encounter (Signed)
 Hailey Barnes

## 2024-04-25 ENCOUNTER — Ambulatory Visit: Payer: 59

## 2024-04-25 VITALS — Ht 62.0 in | Wt 230.0 lb

## 2024-04-25 DIAGNOSIS — Z0000 Encounter for general adult medical examination without abnormal findings: Secondary | ICD-10-CM | POA: Diagnosis not present

## 2024-04-25 NOTE — Progress Notes (Signed)
 Because this visit was a virtual/telehealth visit,  certain criteria was not obtained, such a blood pressure, CBG if applicable, and timed get up and go. Any medications not marked as taking were not mentioned during the medication reconciliation part of the visit. Any vitals not documented were not able to be obtained due to this being a telehealth visit or patient was unable to self-report a recent blood pressure reading due to a lack of equipment at home via telehealth. Vitals that have been documented are verbally provided by the patient.   Subjective:   Hailey Barnes is a 63 y.o. who presents for a Medicare Wellness preventive visit.  As a reminder, Annual Wellness Visits don't include a physical exam, and some assessments may be limited, especially if this visit is performed virtually. We may recommend an in-person follow-up visit with your provider if needed.  Visit Complete: Virtual I connected with  Rojelio Franchot Roulette on 04/25/24 by a audio enabled telemedicine application and verified that I am speaking with the correct person using two identifiers.  Patient Location: Home  Provider Location: Home Office  I discussed the limitations of evaluation and management by telemedicine. The patient expressed understanding and agreed to proceed.  Vital Signs: Because this visit was a virtual/telehealth visit, some criteria may be missing or patient reported. Any vitals not documented were not able to be obtained and vitals that have been documented are patient reported.  VideoDeclined- This patient declined Librarian, academic. Therefore the visit was completed with audio only.  Persons Participating in Visit: Patient.  AWV Questionnaire: No: Patient Medicare AWV questionnaire was not completed prior to this visit.  Cardiac Risk Factors include: advanced age (>49men, >68 women);smoking/ tobacco exposure;dyslipidemia;hypertension;obesity (BMI  >30kg/m2);family history of premature cardiovascular disease     Objective:    Today's Vitals   04/25/24 1424  Weight: 230 lb (104.3 kg)  Height: 5' 2 (1.575 m)  PainSc: 0-No pain   Body mass index is 42.07 kg/m.     04/25/2024    2:26 PM 10/28/2023    6:21 AM 10/23/2023    4:43 AM 07/02/2023   10:47 AM 05/19/2023    2:31 PM 04/24/2023    9:45 AM 03/12/2023   10:22 AM  Advanced Directives  Does Patient Have a Medical Advance Directive? No No No No No No No  Would patient like information on creating a medical advance directive? No - Patient declined   No - Patient declined No - Patient declined Yes (MAU/Ambulatory/Procedural Areas - Information given) No - Patient declined    Current Medications (verified) Outpatient Encounter Medications as of 04/25/2024  Medication Sig   Accu-Chek Softclix Lancets lancets Use to check blood sugar once daily. E11.9   allopurinol  (ZYLOPRIM ) 300 MG tablet TAKE 1 TABLET BY MOUTH EVERY DAY   amLODipine  (NORVASC ) 2.5 MG tablet TAKE 1 TABLET BY MOUTH EVERYDAY AT BEDTIME   aspirin  EC 81 MG tablet Take 81 mg by mouth daily.   atorvastatin  (LIPITOR) 40 MG tablet TAKE 1 TABLET BY MOUTH EVERY DAY   Colchicine  0.6 MG CAPS Take 1 capsule (0.6 mg total) by mouth daily. Take 2 tablets for the first dose, followed by one tablet an hour later. 12 hours later, take one tablet daily until gout flare resolves.   cyclobenzaprine  (FLEXERIL ) 5 MG tablet Take 1 tablet (5 mg total) by mouth every 8 (eight) hours as needed.   fluticasone  (FLONASE ) 50 MCG/ACT nasal spray SPRAY 2 SPRAYS INTO [facility]  NOSTRIL EVERY DAY   glucose blood (ACCU-CHEK AVIVA) test strip Use to check sugar up to 3 times daily   hydrALAZINE  (APRESOLINE ) 100 MG tablet Take 1 tablet (100 mg total) by mouth 3 (three) times daily.   ibuprofen  (ADVIL ) 800 MG tablet Take 1 tablet (800 mg total) by mouth every 8 (eight) hours as needed for moderate pain.   lidocaine -prilocaine  (EMLA ) cream APPLY 1 APPLICATION  TOPICALLY AS NEEDED. FOR UNDER BREAST   losartan  (COZAAR ) 100 MG tablet TAKE 1 TABLET BY MOUTH EVERY DAY   Melaton-Thean-Cham-PassF-LBalm (MELATONIN + L-THEANINE) CAPS Take 2 capsules by mouth at bedtime as needed (sleep).   meloxicam  (MOBIC ) 15 MG tablet Take 1 tablet (15 mg total) by mouth daily.   metoCLOPramide  (REGLAN ) 10 MG tablet Take 1 tablet (10 mg total) by mouth as needed for nausea.   ondansetron  (ZOFRAN -ODT) 8 MG disintegrating tablet Take 1 tablet by mouth every 8 (eight) hours as needed.   pindolol  (VISKEN ) 10 MG tablet TAKE 2 TABLETS BY MOUTH 2 TIMES DAILY.   Polyvinyl Alcohol -Povidone (CLEAR EYES ALL SEASONS OP) Place 1 drop into both eyes daily as needed (allergies/itchy eyes).   spironolactone  (ALDACTONE ) 25 MG tablet TAKE 1 TABLET BY MOUTH EVERYDAY AT BEDTIME   torsemide  (DEMADEX ) 20 MG tablet Take 1 tablet (20 mg total) by mouth every other day.   TRELEGY ELLIPTA  200-62.5-25 MCG/ACT AEPB USE AS DIRECTED 1 PUFF IN THE MOUTH OR THROAT DAILY. TAKE 1 PUFF BY MOUTH EVERY DAY   triamcinolone  (KENALOG ) 0.025 % ointment APPLY 1 APPLICATION TOPICALLY TWICE A DAY AS NEEDED   No facility-administered encounter medications on file as of 04/25/2024.    Allergies (verified) Bupropion  and Norvasc  [amlodipine ]   History: Past Medical History:  Diagnosis Date   Anxiety    Arthritis    knees (02/21/2014), back   CHF (congestive heart failure) (HCC)    Chronic bronchitis (HCC)    get it q yr (02/21/2014)   Chronic diastolic CHF (congestive heart failure) (HCC)    a. Echo 10/23/2016: EF 75 %   Chronic lower back pain    Complication of anesthesia    I don't come out well; I chew on my tongue   COPD (chronic obstructive pulmonary disease) (HCC)    Depression    Diabetes mellitus without complication (HCC)    TYPE 2   Family history of adverse reaction to anesthesia    BROTHER PONV, had a blood clot several days later and heart stopped   Fatty liver    NONALCOLIC   Fibroid     Heart murmur    Hypertension    Infection of right eye    current dx on Saturday 7/15 - using drops   Leg swelling    bilateral   Migraine 1982-2009   MVP (mitral valve prolapse)    Neuromuscular disorder (HCC)    right leg nerve pain   Shortness of breath    WITH EXERTION   Sinus pause    a. noted on telemetry during admission from 10/2016, lasting up to 4.4 seconds. BB discontinued.    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   D/Dx: carpal tunnel, ulnar neuropathy Less likely: cervical radiculopathy, vasculitis    Past Surgical History:  Procedure Laterality Date   CARPAL TUNNEL RELEASE Right    CESAREAN SECTION  1982   CHOLECYSTECTOMY N/A 06/17/2017   Procedure: LAPAROSCOPIC CHOLECYSTECTOMY WITH INTRAOPERATIVE CHOLANGIOGRAM;  Surgeon: Belinda Cough, MD;  Location: [facility] OR;  Service: General;  Laterality: N/A;   COLONOSCOPY WITH PROPOFOL  N/A 04/28/2017   Procedure: COLONOSCOPY WITH PROPOFOL ;  Surgeon: Leigh Elspeth Mt, MD;  Location: [facility];  Service: Gastroenterology;  Laterality: N/A;   DILATATION & CURETTAGE/HYSTEROSCOPY WITH MYOSURE N/A 05/06/2016   Procedure: DILATATION & CURETTAGE/HYSTEROSCOPY WITH MYOSURE;  Surgeon: Curlee VEAR Guan, MD;  Location: [facility] ORS;  Service: Gynecology;  Laterality: N/A;  request to follow around 8:45  requests one hour OR time   DILATATION & CURETTAGE/HYSTEROSCOPY WITH MYOSURE N/A 04/03/2017   Procedure: DILATATION & CURETTAGE/HYSTEROSCOPY WITH MYOSURE;  Surgeon: Guan Curlee VEAR, MD;  Location: [facility];  Service: Gynecology;  Laterality: N/A;   DILATION AND CURETTAGE OF UTERUS      ESOPHAGOGASTRODUODENOSCOPY (EGD) WITH PROPOFOL  N/A 04/28/2017   Procedure: ESOPHAGOGASTRODUODENOSCOPY (EGD) WITH PROPOFOL ;  Surgeon: Leigh Elspeth Mt, MD;  Location: [facility];  Service: Gastroenterology;  Laterality: N/A;   FOOT SURGERY Bilateral    took bones out; put pins in   IR ABLATE LIVER CRYOABLATION  02/08/2020   IR RADIOLOGIST EVAL & MGMT  02/03/2020   KNEE ARTHROSCOPY Left 08/16/2018   Procedure: ARTHROSCOPY KNEE PARTIAL MEDIAL MENISECTOMY, CHONDROPLASTY MEDIAL AND LATERAL, PLICA RELEASE MEDIAL;  Surgeon: Yvone Rush, MD;  Location: [facility] OR;  Service: Orthopedics;  Laterality: Left;   LEFT AND RIGHT HEART CATHETERIZATION WITH CORONARY ANGIOGRAM N/A 03/31/2014   Procedure: LEFT AND RIGHT HEART CATHETERIZATION WITH CORONARY ANGIOGRAM;  Surgeon: Salena GORMAN Negri, MD;  Location: [facility] CATH [facility];  Service: Cardiovascular;  Laterality: N/A;   MYOMECTOMY  YRS AGO   sonohystogram  04/14/2016   [facility]: intramural fibroid, premenopausal endometrium, right fluid filled tubular structure   Family History  Problem Relation Age of Onset   Hypertension Mother    COPD Mother    Diabetes Father    Hypertension Father    Cancer Sister        UTERINE???   COPD Sister    Hypertension Sister    Hypertension Sister    Colon cancer Neg Hx    Esophageal cancer Neg Hx    Stomach cancer Neg Hx    Rectal cancer Neg Hx    Social History   Socioeconomic History   Marital status: Divorced    Spouse name: Not on file   Number of children: 1   Years of education: 12   Highest education level: 12th grade  Occupational History   Occupation: Set designer    Comment: disabled   Occupation: patient care assistance  Tobacco Use   Smoking status: Every Day    Current packs/day: 0.50    Average packs/day: 0.5 packs/day for 47.5 years (23.8 ttl pk-yrs)    Types: Cigarettes    Start date: 10/20/1976    Passive exposure: Current   Smokeless tobacco: Never   Tobacco  comments:    Previous 1 ppd smoker x > 40 years.  Now smokes 10 cigarettes per day  Vaping Use   Vaping status: Never Used  Substance and Sexual Activity   Alcohol  use: Yes    Comment: occasional   Drug use: Yes    Types: Marijuana  Comment: 03/30/2014 stopped using crack 02/21/2014   Sexual activity: Yes    Partners: Male    Birth control/protection: Post-menopausal  Other Topics Concern   Not on file  Social History Narrative   Divorced since 2015.   Lives alone. No pets. Daughter lives in [place]. Daughter helps with transportation. Talks daily on phone, sees once or twice a week. 2 grandkids.   Enjoys church at [employer], [place] geared towards people with substance abuse history.    Lives in duplex apartment, one level, three stairs to get in. Has handrails, has grab bars in bathroom. Smoke alarms.   Bakes most meals, avoids starches, rice. Eats vegetables. Drinks tea, ginger ale.    Likes to go to movies, used to walk daily but not now due to knee pain. Likes to spend time with grandchildren, go out to eat. Spend time with family and circle of friends.    Social Drivers of Corporate investment banker Strain: Low Risk  (04/25/2024)   Overall Financial Resource Strain (CARDIA)    Difficulty of Paying Living Expenses: Not hard at all  Food Insecurity: No Food Insecurity (04/25/2024)   Hunger Vital Sign    Worried About Running Out of Food in the Last Year: Never true    Ran Out of Food in the Last Year: Never true  Transportation Needs: No Transportation Needs (04/25/2024)   PRAPARE - Administrator, Civil Service (Medical): No    Lack of Transportation (Non-Medical): No  Physical Activity: Sufficiently Active (04/25/2024)   Exercise Vital Sign    Days of Exercise per Week: 3 days    Minutes of Exercise per Session: 60 min  Stress: No Stress Concern Present (04/25/2024)   [employer] of Occupational Health - Occupational Stress Questionnaire     Feeling of Stress: Not at all  Social Connections: Moderately Isolated (04/25/2024)   Social Connection and Isolation Panel    Frequency of Communication with Friends and Family: Three times a week    Frequency of Social Gatherings with Friends and Family: Once a week    Attends Religious Services: More than 4 times per year    Active Member of [employer] or Organizations: No    Attends Banker Meetings: Never    Marital Status: Divorced    Tobacco Counseling Ready to quit: Not Answered Counseling given: Not Answered Tobacco comments: Previous 1 ppd smoker x > 40 years.  Now smokes 10 cigarettes per day    Clinical Intake:  Pre-visit preparation completed: Yes  Pain : No/denies pain Pain Score: 0-No pain     BMI - recorded: 42.07 Nutritional Status: BMI > 30  Obese Nutritional Risks: None Diabetes: No  Lab Results  Component Value Date   HGBA1C 5.7 04/10/2023   HGBA1C 5.7 08/11/2022   HGBA1C 6.3 (H) 03/16/2022     How often do you need to have someone help you when you read instructions, pamphlets, or other written materials from your doctor or pharmacy?: 1 - Never  Interpreter Needed?: No  Information entered by :: Deklen Popelka N. Ladarious Kresse, LPN.   Activities of Daily Living     04/25/2024    2:27 PM  In your present state of health, do you have any difficulty performing the following activities:  Hearing? 0  Vision? 0  Difficulty concentrating or making decisions? 0  Comment BSE: None  Walking or climbing stairs? 0  Dressing or bathing? 0  Doing errands, shopping? 0  Preparing Food and eating ? N  Using the Toilet? N  In the past six months, have you accidently leaked urine? N  Do you have problems with loss of bowel control? N  Managing your Medications? N  Managing your Finances? N  Housekeeping or managing your Housekeeping? N    Patient Care Team: Manon Jester, DO as PCP - General (Family Medicine) Jeffrie Oneil BROCKS, MD as PCP - Cardiology  (Cardiology) Wonda Cy BROCKS, RD as Dietitian (Family Medicine) Yvone Rush, MD as Consulting Physician (Orthopedic Surgery) Curtis Debby PARAS, MD as Consulting Physician (Family Medicine) Armbruster, Elspeth SQUIBB, MD as Consulting Physician (Gastroenterology) Weyman Corning, RN as [facility] Care Management Weyman Corning, RN as Case Manager Armbruster, Elspeth SQUIBB, MD as Consulting Physician (Gastroenterology) Mammography, [facility] (Diagnostic Radiology) [facility]  I have updated your Care Teams any recent Medical Services you may have received from other providers in the past year.     Assessment:   This is a routine wellness examination for [place].  Hearing/Vision screen Hearing Screening - Comments:: Adequate hearing. Vision Screening - Comments:: Wears rx glasses - up to date with routine eye exams with [facility]    Goals Addressed             This Visit's Progress    Blood Pressure < 140/90       Quit Smoking       Quit Smoking Goals: Manage cravings and triggers by practicing mindfulness and relaxation techniques. Create a personalized quit plan. Enlist support from friends and family. Reward yourself for accomplishments. Find alternative activities. Make healthier choices. Prepare for difficult situations. Identify reasons to quit.       Depression Screen     04/25/2024    2:29 PM 12/24/2023    1:50 PM 10/19/2023    4:25 PM 07/02/2023   10:47 AM 06/16/2023    1:22 PM 05/19/2023    2:31 PM 04/24/2023    9:43 AM  PHQ 2/9 Scores  PHQ - 2 Score 0 0 0 4 4 1  0  PHQ- 9 Score 0 0 0 9 9 1 7     Fall Risk     04/25/2024    2:26 PM 12/24/2023    1:50 PM 09/08/2023   10:53 AM 06/16/2023    1:22 PM 04/24/2023    9:44 AM  Fall Risk   Falls in the past year? 0 0 0 0 0  Number falls in past yr: 0 0 0 0 0  Injury with Fall? 0 0 0  0  Risk for fall due to : No Fall Risks  No Fall Risks  No Fall Risks  Follow up Falls  evaluation completed  Falls evaluation completed  Falls prevention discussed;Education provided;Falls evaluation completed    MEDICARE RISK AT HOME:  Medicare Risk at Home Any stairs in or around the home?: No If so, are there any without handrails?: No Home free of loose throw rugs in walkways, pet beds, electrical cords, etc?: Yes Adequate lighting in your home to reduce risk of falls?: Yes Life alert?: No Use of a cane, walker or w/c?: No Grab bars in the bathroom?: No Shower chair or bench in shower?: No Elevated toilet seat or a handicapped toilet?: No  TIMED UP AND GO:  Was the test performed?  No  Cognitive Function: 6CIT completed    04/25/2024    2:29 PM 09/07/2018   10:37 AM  MMSE - Mini Mental State  Exam  Not completed: Unable to complete   Orientation to time  5  Orientation to Place  5  Registration  3  Attention/ Calculation  5  Recall  3  Language- name 2 objects  2  Language- repeat  1  Language- follow 3 step command  3  Language- read & follow direction  1  Write a sentence  1  Copy design  1  Total score  30        04/24/2023    9:44 AM 09/07/2018   10:38 AM  6CIT Screen  What Year? 0 points 0 points  What month? 0 points 0 points  What time? 0 points 0 points  Count back from 20 0 points 0 points  Months in reverse 0 points 0 points  Repeat phrase 0 points 0 points  Total Score 0 points 0 points    Immunizations Immunization History  Administered Date(s) Administered   Influenza,inj,Quad PF,6+ Mos 06/23/2014, 12/07/2015, 08/07/2016, 09/21/2017, 09/07/2018, 08/19/2021   Influenza-Unspecified 07/11/2020   Moderna Sars-Covid-2 Vaccination 03/23/2020, 04/20/2020   PNEUMOCOCCAL CONJUGATE-20 10/23/2021   Pneumococcal Polysaccharide-23 03/31/2014    Screening Tests Health Maintenance  Topic Date Due   Zoster Vaccines- Shingrix (1 of 2) Never done   FOOT EXAM  05/22/2023   COVID-19 Vaccine (3 - 2024-25 season) 06/21/2023   HEMOGLOBIN A1C   10/10/2023   Diabetic kidney evaluation - Urine ACR  04/09/2024   Lung Cancer Screening  04/28/2024   INFLUENZA VACCINE  05/20/2024   OPHTHALMOLOGY EXAM  06/30/2024   Diabetic kidney evaluation - eGFR measurement  10/22/2024   MAMMOGRAM  04/01/2025   Medicare Annual Wellness (AWV)  04/25/2025   Cervical Cancer Screening (HPV/Pap Cotest)  10/06/2028   Colonoscopy  12/14/2028   Pneumococcal Vaccine 55-73 Years old  Completed   Hepatitis C Screening  Completed   HIV Screening  Completed   Hepatitis B Vaccines  Aged Out   HPV VACCINES  Aged Out   Meningococcal B Vaccine  Aged Out   DTaP/Tdap/Td  Discontinued    Health Maintenance  Health Maintenance Due  Topic Date Due   Zoster Vaccines- Shingrix (1 of 2) Never done   FOOT EXAM  05/22/2023   COVID-19 Vaccine (3 - 2024-25 season) 06/21/2023   HEMOGLOBIN A1C  10/10/2023   Diabetic kidney evaluation - Urine ACR  04/09/2024   Lung Cancer Screening  04/28/2024   Health Maintenance Items Addressed: Yes Patient is due for the following care gaps: Foot Exam, Lung Cancer Screening due to smoking and lab work.  Patient declined any future vaccines.  Additional Screening:  Vision Screening: Recommended annual ophthalmology exams for early detection of glaucoma and other disorders of the eye. Would you like a referral to an eye doctor? No    Dental Screening: Recommended annual dental exams for proper oral hygiene  Community Resource Referral / Chronic Care Management: CRR required this visit?  No   CCM required this visit?  No   Plan:    I have personally reviewed and noted the following in the patient's chart:   Medical and social history Use of alcohol , tobacco or illicit drugs  Current medications and supplements including opioid prescriptions. Patient is not currently taking opioid prescriptions. Functional ability and status Nutritional status Physical activity Advanced directives List of other  physicians Hospitalizations, surgeries, and ER visits in previous 12 months Vitals Screenings to include cognitive, depression, and falls Referrals and appointments  In addition, I have reviewed and discussed with  patient certain preventive protocols, quality metrics, and best practice recommendations. A written personalized care plan for preventive services as well as general preventive health recommendations were provided to patient.   Roz LOISE Fuller, LPN   11/21/7972   After Visit Summary: (MyChart) Due to this being a telephonic visit, the after visit summary with patients personalized plan was offered to patient via MyChart   Notes: Patient is due for the following care gaps: Foot Exam, Lung Cancer Screening due to smoking and lab work.  Patient declined any future vaccines.

## 2024-04-25 NOTE — Patient Instructions (Signed)
 Ms. Lillibridge , Thank you for taking time out of your busy schedule to complete your Annual Wellness Visit with me. I enjoyed our conversation and look forward to speaking with you again next year. I, as well as your care team,  appreciate your ongoing commitment to your health goals. Please review the following plan we discussed and let me know if I can assist you in the future. Your Game plan/ To Do List    Referrals: If you haven't heard from the office you've been referred to, please reach out to them at the phone provided.   Follow up Visits: Next Medicare AWV with our clinical staff: 04/27/2025 at 2:50 p.m. phone visit with Nurse Health Advisor   Have you seen your provider in the last 6 months (3 months if uncontrolled diabetes)? No Next Office Visit with your provider: Office will call patient to schedule  Clinician Recommendations:  Aim for 30 minutes of exercise or brisk walking, 6-8 glasses of water, and 5 servings of fruits and vegetables each day.       This is a list of the screening recommended for you and due dates:  Health Maintenance  Topic Date Due   Zoster (Shingles) Vaccine (1 of 2) Never done   Complete foot exam   05/22/2023   COVID-19 Vaccine (3 - 2024-25 season) 06/21/2023   Hemoglobin A1C  10/10/2023   Yearly kidney health urinalysis for diabetes  04/09/2024   Screening for Lung Cancer  04/28/2024   Flu Shot  05/20/2024   Eye exam for diabetics  06/30/2024   Yearly kidney function blood test for diabetes  10/22/2024   Mammogram  04/01/2025   Medicare Annual Wellness Visit  04/25/2025   Pap with HPV screening  10/06/2028   Colon Cancer Screening  12/14/2028   Pneumococcal Vaccination  Completed   Hepatitis C Screening  Completed   HIV Screening  Completed   Hepatitis B Vaccine  Aged Out   HPV Vaccine  Aged Out   Meningitis B Vaccine  Aged Out   DTaP/Tdap/Td vaccine  Discontinued    Advanced directives: (Declined) Advance directive discussed with you today.  Even though you declined this today, please call our office should you change your mind, and we can give you the proper paperwork for you to fill out. Advance Care Planning is important because it:  [x]  Makes sure you receive the medical care that is consistent with your values, goals, and preferences  [x]  It provides guidance to your family and loved ones and reduces their decisional burden about whether or not they are making the right decisions based on your wishes.  Follow the link provided in your after visit summary or read over the paperwork we have mailed to you to help you started getting your Advance Directives in place. If you need assistance in completing these, please reach out to us  so that we can help you!  See attachments for Preventive Care and Fall Prevention Tips.

## 2024-05-04 ENCOUNTER — Other Ambulatory Visit: Payer: Self-pay | Admitting: Student

## 2024-05-04 DIAGNOSIS — I10 Essential (primary) hypertension: Secondary | ICD-10-CM

## 2024-05-04 DIAGNOSIS — J449 Chronic obstructive pulmonary disease, unspecified: Secondary | ICD-10-CM

## 2024-05-06 ENCOUNTER — Encounter: Payer: Self-pay | Admitting: Student

## 2024-05-06 ENCOUNTER — Ambulatory Visit (INDEPENDENT_AMBULATORY_CARE_PROVIDER_SITE_OTHER): Admitting: Student

## 2024-05-06 VITALS — BP 166/108 | HR 66 | Wt 229.8 lb

## 2024-05-06 DIAGNOSIS — I10 Essential (primary) hypertension: Secondary | ICD-10-CM

## 2024-05-06 DIAGNOSIS — L84 Corns and callosities: Secondary | ICD-10-CM

## 2024-05-06 DIAGNOSIS — E1169 Type 2 diabetes mellitus with other specified complication: Secondary | ICD-10-CM | POA: Diagnosis not present

## 2024-05-06 DIAGNOSIS — F17200 Nicotine dependence, unspecified, uncomplicated: Secondary | ICD-10-CM

## 2024-05-06 LAB — POCT GLYCOSYLATED HEMOGLOBIN (HGB A1C): HbA1c, POC (controlled diabetic range): 5.5 % (ref 0.0–7.0)

## 2024-05-06 NOTE — Assessment & Plan Note (Signed)
 Stable and well-controlled.  Not on any current diabetic medication.  A1c today 5.5%.  Foot exam completed and NVI but showed multiple calluses on foot bilaterally - Obtained A1c, BMP and microalbuminuria - Completed annual diabetic foot exam - Encourage continued diet and exercise

## 2024-05-06 NOTE — Progress Notes (Signed)
    SUBJECTIVE:   CHIEF COMPLAINT / HPI:   63 year old female with Hx of T2DM, HFpEF, HTN, NASH, OSA Presenting for diabetes follow-up A1c a year year ago was 5.7 Not on any diabetics medication  On other meds including Lipitor, Losartan   Hypertension Reports noncompliance to medication.  Does not check home BPs.  Current medication include amlodipine  2.5 mg daily, losartan  100 mg daily and spironolactone  25 mg daily.  Denies any headaches, vision changes, chest pain or shortness of breath.  However reports occasional swelling in the feet in the evening mostly after door dashing  PERTINENT  PMH / PSH: Reviewed   OBJECTIVE:   BP (!) 166/108   Pulse 66   Wt 229 lb 12.8 oz (104.2 kg)   LMP 04/06/2017   SpO2 94%   BMI 42.03 kg/m     Physical Exam General: Alert, well appearing, NAD Cardiovascular: RRR, No Murmurs, Normal S2/S2 Respiratory: CTAB, No wheezing or Rales Abdomen: No distension or tenderness Extremities: No edema on extremities   Foot exam: NVI, multiple calluses on foot bilaterally with one open healing lesion which doesn't appear infected and no pain   ASSESSMENT/PLAN:   Hypertension BP elevated x 2.  Patient reports noncompliance to BP medication and currently asymptomatic. - Patient encouraged to take blood pressure medications daily - Advised to keep a daily blood pressure log - Follow-up in 2 weeks to reassess blood pressure  Tobacco use disorder Over 20 years of tobacco use. - Place order for chest CT for routine lung cancer screening. - Discussed need for smoking cessation. - Patient aware of treatment options.  T2DM (type 2 diabetes mellitus) (HCC) Stable and well-controlled.  Not on any current diabetic medication.  A1c today 5.5%.  Foot exam completed and NVI but showed multiple calluses on foot bilaterally - Obtained A1c, BMP and microalbuminuria - Completed annual diabetic foot exam - Encourage continued diet and exercise  Calluses on  Feet Recurrent calluses likely due to footwear. Recommended podiatrist evaluation. - Refer to podiatrist for foot calluses -Discussed need for appropriate foot wear     Norleen April, MD [facility]

## 2024-05-06 NOTE — Assessment & Plan Note (Signed)
 BP elevated x 2.  Patient reports noncompliance to BP medication and currently asymptomatic. - Patient encouraged to take blood pressure medications daily - Advised to keep a daily blood pressure log - Follow-up in 2 weeks to reassess blood pressure

## 2024-05-06 NOTE — Assessment & Plan Note (Signed)
 Over 20 years of tobacco use. - Place order for chest CT for routine lung cancer screening. - Discussed need for smoking cessation. - Patient aware of treatment options.

## 2024-05-06 NOTE — Patient Instructions (Addendum)
 Pleasure to see you today.  Your A1c today was great at 5.5%  We also ordered labs to check your electrolytes, kidney function and cholesterol levels  Her blood pressure today was highly elevated.  Please make sure you are taking your blood pressure medications daily.   Keep a daily blood pressure log and follow-up in 2 weeks let us  recheck your blood pressure   Given your smoking history I have put in a routine chest CT screening for lung cancer.  You can go to [address], [place] to have these imaging completed.

## 2024-05-07 LAB — LIPID PANEL
Chol/HDL Ratio: 2.7 ratio (ref 0.0–4.4)
Cholesterol, Total: 154 mg/dL (ref 100–199)
HDL: 58 mg/dL (ref >39)
LDL Chol Calc (NIH): 76 mg/dL (ref 0–99)
Triglycerides: 110 mg/dL (ref 0–149)
VLDL Cholesterol Cal: 20 mg/dL (ref 5–40)

## 2024-05-07 LAB — MICROALBUMIN / CREATININE URINE RATIO
Creatinine, Urine: 25 mg/dL
Microalb/Creat Ratio: <12 mg/g{creat} (ref 0–29)
Microalbumin, Urine: <3 ug/mL

## 2024-05-07 LAB — BASIC METABOLIC PANEL WITH GFR
BUN/Creatinine Ratio: 10 — ABNORMAL LOW (ref 12–28)
BUN: 9 mg/dL (ref 8–27)
CO2: 23 mmol/L (ref 20–29)
Calcium: 9.6 mg/dL (ref 8.7–10.3)
Chloride: 103 mmol/L (ref 96–106)
Creatinine, Ser: 0.94 mg/dL (ref 0.57–1.00)
Glucose: 82 mg/dL (ref 70–99)
Potassium: 4.6 mmol/L (ref 3.5–5.2)
Sodium: 139 mmol/L (ref 134–144)
eGFR: 69 mL/min/1.73 (ref >59)

## 2024-05-08 ENCOUNTER — Ambulatory Visit: Payer: Self-pay | Admitting: Student

## 2024-05-11 NOTE — Telephone Encounter (Signed)
 Called patient and provided with update.   Patient will keep follow up scheduled on 05/19/24 for further BP management.   Chiquita JAYSON English, RN

## 2024-05-19 ENCOUNTER — Ambulatory Visit (INDEPENDENT_AMBULATORY_CARE_PROVIDER_SITE_OTHER): Admitting: Student

## 2024-05-19 ENCOUNTER — Encounter: Payer: Self-pay | Admitting: Student

## 2024-05-19 VITALS — BP 142/84 | HR 77 | Ht 62.5 in | Wt 225.2 lb

## 2024-05-19 DIAGNOSIS — I10 Essential (primary) hypertension: Secondary | ICD-10-CM

## 2024-05-19 NOTE — Assessment & Plan Note (Signed)
 Hypertension improved but slightly elevated. Current medications include amlodipine , losartan , and spironolactone . Discussed potential swelling with increased amlodipine  dosage. - Increase amlodipine  to 5 mg daily by taking two 2.5 mg tablets. - Continue losartan  100 mg and spironolactone  as prescribed. - Monitor blood pressure daily and maintain a log. - Follow up with pharmacist in two weeks to assess blood pressure and check for swelling. - If swelling occurs, return for evaluation before the two-week follow-up.

## 2024-05-19 NOTE — Progress Notes (Signed)
    SUBJECTIVE:   CHIEF COMPLAINT / HPI:   Hailey Barnes is a 63 year old female with hypertension who presents for a blood pressure follow-up.  Patient forgot her BP logs today but reports her home blood pressure readings have improved since adhering to her medication regimen, with recent measurements of 137/95 mmHg and 149/85 mmHg. She is currently taking amlodipine  2.5 mg, losartan  100 mg, and spironolactone . She has resumed taking her night medication, which she believes has contributed to the improvement. She previously experienced peripheral edema on a higher dose of amlodipine , leading to a dose reduction.  PERTINENT  PMH / PSH: Reviewed   OBJECTIVE:   BP (!) 142/84   Pulse 77   Ht 5' 2.5 (1.588 m)   Wt 225 lb 3.2 oz (102.2 kg)   LMP 04/06/2017   SpO2 97%   BMI 40.53 kg/m    Physical Exam General: Alert, well appearing, NAD Cardiovascular: RRR, No Murmurs, Normal S2/S2 Respiratory: CTAB, No wheezing or Rales Abdomen: No distension or tenderness Extremities: No edema on extremities   Skin: Warm and dry  ASSESSMENT/PLAN:   Hypertension Hypertension improved but slightly elevated. Current medications include amlodipine , losartan , and spironolactone . Discussed potential swelling with increased amlodipine  dosage. - Increase amlodipine  to 5 mg daily by taking two 2.5 mg tablets. - Continue losartan  100 mg and spironolactone  as prescribed. - Monitor blood pressure daily and maintain a log. - Follow up with pharmacist in two weeks to assess blood pressure and check for swelling. - If swelling occurs, return for evaluation before the two-week follow-up.  Tobacco use Chronic tobacco use. Discussed health impact and encouraged cessation. - Encourage smoking cessation. - Provided patient with contact for [facility] imaging for her routine CT screening for lung cancer  Norleen April, MD [facility]

## 2024-05-19 NOTE — Patient Instructions (Signed)
 Pleasure to see you today.  Your blood pressure today is improved but still slightly a little high.  I have increased your amlodipine  from 2.5 mg to 5 mg daily.  So take 2 of the tablets.  Continue to take your losartan , torsemide  and spironolactone  as is.  Please check your blood pressure daily and keep a record.  Follow-up with our pharmacist in 2 weeks to reassess your blood pressure and bring your blood pressure log at that appointment.

## 2024-05-23 ENCOUNTER — Ambulatory Visit (INDEPENDENT_AMBULATORY_CARE_PROVIDER_SITE_OTHER): Admitting: Podiatry

## 2024-05-23 ENCOUNTER — Encounter: Payer: Self-pay | Admitting: Podiatry

## 2024-05-23 DIAGNOSIS — L84 Corns and callosities: Secondary | ICD-10-CM | POA: Diagnosis not present

## 2024-05-23 DIAGNOSIS — B351 Tinea unguium: Secondary | ICD-10-CM

## 2024-05-23 DIAGNOSIS — M79674 Pain in right toe(s): Secondary | ICD-10-CM | POA: Diagnosis not present

## 2024-05-23 DIAGNOSIS — M79675 Pain in left toe(s): Secondary | ICD-10-CM | POA: Diagnosis not present

## 2024-05-23 DIAGNOSIS — E1142 Type 2 diabetes mellitus with diabetic polyneuropathy: Secondary | ICD-10-CM

## 2024-05-23 NOTE — Progress Notes (Signed)
 This patient returns to my office for at risk foot care.  This patient requires this care by a professional since this patient will be at risk due to having diabetic neuropathy.This patient is unable to cut nails herself since the patient cannot reach her nails.These nails are painful walking and wearing shoes.  She also has painful callus both feet.  This patient presents for at risk foot care today.  General Appearance  Alert, conversant and in no acute stress.  Vascular  Dorsalis pedis and posterior tibial  pulses are palpable  bilaterally.  Capillary return is within normal limits  bilaterally. Temperature is within normal limits  bilaterally.  Neurologic  Senn-Weinstein monofilament wire test within normal limits  bilaterally. Muscle power within normal limits bilaterally.  Nails Thick disfigured discolored nails with subungual debris  from hallux  toenails bilaterally. No evidence of bacterial infection or drainage bilaterally.  Orthopedic  No limitations of motion  feet .  No crepitus or effusions noted.  No bony pathology or digital deformities noted.  Skin  normotropic skin with no porokeratosis noted bilaterally.  No signs of infections or ulcers noted.   Callus sub 5th metabase left foot and callus under big toe joint right foot.  Pinch callus  B/L.  Onychomycosis  Pain in right toes  Pain in left toes  Callus  B/L.  Consent was obtained for treatment procedures.   Mechanical debridement of nails 1-5  bilaterally performed with a nail nipper.  Filed with dremel without incident. Callus treated with # 15 blade and dremel tool.   Return office visit    4 months                  Told patient to return for periodic foot care and evaluation due to potential at risk complications.   Cordella Bold DPM

## 2024-06-02 ENCOUNTER — Ambulatory Visit: Admitting: Pharmacist

## 2024-06-07 ENCOUNTER — Telehealth: Payer: Self-pay | Admitting: Pharmacist

## 2024-06-07 NOTE — Telephone Encounter (Signed)
 Patient contacted for follow-up of missed appointment.   Since last contact patient reports smoking about the same [place], life has been a bit stressful recently  Rescheduled appointment in 9 days.   Total time with patient call and documentation of interaction: 5 minutes.

## 2024-06-16 ENCOUNTER — Ambulatory Visit: Admitting: Pharmacist

## 2024-06-21 ENCOUNTER — Encounter: Payer: Self-pay | Admitting: Sports Medicine

## 2024-06-24 ENCOUNTER — Telehealth: Payer: Self-pay | Admitting: Pharmacist

## 2024-06-24 NOTE — Telephone Encounter (Signed)
 Attempted to contact patient for missed appointment.   Left HIPAA compliant voice mail requesting call back to direct phone: [phone]  Total time with patient call and documentation of interaction: 2 minutes.

## 2024-06-29 DIAGNOSIS — J439 Emphysema, unspecified: Secondary | ICD-10-CM | POA: Diagnosis not present

## 2024-06-29 DIAGNOSIS — I5032 Chronic diastolic (congestive) heart failure: Secondary | ICD-10-CM | POA: Diagnosis not present

## 2024-06-29 DIAGNOSIS — M25562 Pain in left knee: Secondary | ICD-10-CM | POA: Diagnosis not present

## 2024-06-29 DIAGNOSIS — I110 Hypertensive heart disease with heart failure: Secondary | ICD-10-CM | POA: Diagnosis not present

## 2024-06-29 DIAGNOSIS — F17210 Nicotine dependence, cigarettes, uncomplicated: Secondary | ICD-10-CM | POA: Diagnosis not present

## 2024-06-29 DIAGNOSIS — E1151 Type 2 diabetes mellitus with diabetic peripheral angiopathy without gangrene: Secondary | ICD-10-CM | POA: Diagnosis not present

## 2024-06-29 DIAGNOSIS — M25561 Pain in right knee: Secondary | ICD-10-CM | POA: Diagnosis not present

## 2024-06-29 DIAGNOSIS — G8929 Other chronic pain: Secondary | ICD-10-CM | POA: Diagnosis not present

## 2024-06-29 DIAGNOSIS — I700 Atherosclerosis of aorta: Secondary | ICD-10-CM | POA: Diagnosis not present

## 2024-06-29 DIAGNOSIS — I2510 Atherosclerotic heart disease of native coronary artery without angina pectoris: Secondary | ICD-10-CM | POA: Diagnosis not present

## 2024-07-15 DIAGNOSIS — Z79899 Other long term (current) drug therapy: Secondary | ICD-10-CM | POA: Diagnosis not present

## 2024-07-15 DIAGNOSIS — E1151 Type 2 diabetes mellitus with diabetic peripheral angiopathy without gangrene: Secondary | ICD-10-CM | POA: Diagnosis not present

## 2024-07-15 DIAGNOSIS — Z0001 Encounter for general adult medical examination with abnormal findings: Secondary | ICD-10-CM | POA: Diagnosis not present

## 2024-07-15 DIAGNOSIS — I110 Hypertensive heart disease with heart failure: Secondary | ICD-10-CM | POA: Diagnosis not present

## 2024-07-15 DIAGNOSIS — J439 Emphysema, unspecified: Secondary | ICD-10-CM | POA: Diagnosis not present

## 2024-07-15 DIAGNOSIS — Z1321 Encounter for screening for nutritional disorder: Secondary | ICD-10-CM | POA: Diagnosis not present

## 2024-07-15 DIAGNOSIS — R2681 Unsteadiness on feet: Secondary | ICD-10-CM | POA: Diagnosis not present

## 2024-07-15 DIAGNOSIS — Z7189 Other specified counseling: Secondary | ICD-10-CM | POA: Diagnosis not present

## 2024-07-15 DIAGNOSIS — I5032 Chronic diastolic (congestive) heart failure: Secondary | ICD-10-CM | POA: Diagnosis not present

## 2024-07-20 ENCOUNTER — Other Ambulatory Visit: Payer: Self-pay

## 2024-07-20 DIAGNOSIS — J310 Chronic rhinitis: Secondary | ICD-10-CM

## 2024-07-25 ENCOUNTER — Ambulatory Visit: Admission: RE | Admit: 2024-07-25 | Discharge: 2024-07-25 | Disposition: A | Source: Ambulatory Visit

## 2024-07-25 ENCOUNTER — Ambulatory Visit
Admission: RE | Admit: 2024-07-25 | Discharge: 2024-07-25 | Disposition: A | Discharge status: Home / Self Care | Source: Ambulatory Visit | Attending: Family Medicine | Admitting: Family Medicine

## 2024-07-25 DIAGNOSIS — J310 Chronic rhinitis: Secondary | ICD-10-CM

## 2024-07-25 DIAGNOSIS — F17200 Nicotine dependence, unspecified, uncomplicated: Secondary | ICD-10-CM

## 2024-07-26 ENCOUNTER — Telehealth: Payer: Self-pay | Admitting: Family Medicine

## 2024-07-26 NOTE — Telephone Encounter (Signed)
 Pt has not seen Dr. Joane in > 1 year.   Pt last saw Dr. Leonce 02/02/24. Forwarding to Dr. Leonce.

## 2024-07-26 NOTE — Telephone Encounter (Signed)
 Pt calling to ask for a repeat epidural to be ordered for her back/hips. She said she has one this year, but I could not find.

## 2024-07-27 ENCOUNTER — Ambulatory Visit (INDEPENDENT_AMBULATORY_CARE_PROVIDER_SITE_OTHER): Admitting: Family Medicine

## 2024-07-27 ENCOUNTER — Encounter: Payer: Self-pay | Admitting: Family Medicine

## 2024-07-27 VITALS — BP 120/84 | HR 78 | Ht 62.5 in | Wt 231.0 lb

## 2024-07-27 DIAGNOSIS — M5416 Radiculopathy, lumbar region: Secondary | ICD-10-CM | POA: Diagnosis not present

## 2024-07-27 NOTE — Patient Instructions (Addendum)
 Thank you for coming in today.   We've placed an order for a back injection, the imaging center will reach out over the next 1-2 weeks to assist with scheduling.   See you back as needed.

## 2024-07-27 NOTE — Progress Notes (Unsigned)
   I, Leotis Batter, CMA acting as a scribe for Artist Lloyd, MD.  Hailey Barnes is a 63 y.o. female who presents to [facility] today for exacerbation of her lumbar radiculopathy. Pt was last seen for her back issues on 08/13/22 and a L-spine MRI was ordered. Based on findings, ESI ordered  Last lumbar ESI, 10/12/23.  Today, pt reports worsening back pain over the past several months. Had some reservations about previously ordered back injection but notes that pain is now severe enough that she would like to pursue. Went for chest CT scan yesterday, tweaked the back while being assisted off of the table. Now paining more nerve-type pain. Also notes falling while going up the stairs on 1 occasion since last visit.   Dx testing: 09/08/22 L-spine MRI   Pertinent review of systems: No fevers or chills  Relevant historical information: Lumbar radiculopathy.   Exam:  BP 120/84   Pulse 78   Ht 5' 2.5 (1.588 m)   Wt 231 lb (104.8 kg)   LMP 04/06/2017   SpO2 96%   BMI 41.58 kg/m  General: Well Developed, well nourished, and in no acute distress.   MSK: L-spine normal-appearing Nontender palpation midline. Decreased lumbar motion. Lower extremity strength is intact Reflexes are intact. Positive slump test.    Lab and Radiology Results  EXAM: LUMBAR SPINE - COMPLETE 4+ VIEW   COMPARISON:  CT Abdomen and Pelvis 10/23/2023. Lumbar radiographs 04/12/2014.   FINDINGS: Stable cholecystectomy clips. Negative visible bowel gas pattern. Normal lumbar segmentation. Stable lordosis with grade 1 anterolisthesis of L4 on L5. Chronic disc degeneration there. Widespread advanced lumbar facet arthropathy, moderate to severe at most levels. Mild retrolisthesis superimposed at L5-S1 also with some disc degeneration. Stable vertebral height. No pars fracture. No acute osseous abnormality identified. SI joints appear within normal limits.    IMPRESSION: 1. No acute osseous abnormality identified in the lumbar spine. 2. Widespread severe lumbar facet arthropathy. Grade 1 chronic spondylolisthesis at both L4-L5 and L5-S1.     Electronically Signed   By: VEAR Hurst M.D.   On: 10/28/2023 08:13   I, Artist Lloyd, personally (independently) visualized and performed the interpretation of the images attached in this note.      Assessment and Plan: 63 y.o. female with worsening chronic low back pain with bilateral lumbar radiculopathy in an L5 dermatomal pattern.  This does fit with previous MRI findings from November 2023.  Plan for an epidural steroid injection.  She rates her current symptoms is quite severe.  If injection does not help would recommend repeat MRI.  She does have an updated lumbar spine x-ray from January 2025 that fits with the previous MRI as well.   PDMP not reviewed this encounter. Orders Placed This Encounter  Procedures   DG INJECT DIAG/THERA/INC NEEDLE/CATH/PLC EPI/LUMB/SAC W/IMG    Level and technique per radiology    Standing Status:   Future    Expiration Date:   08/27/2024    Reason for Exam (SYMPTOM  OR DIAGNOSIS REQUIRED):   Low back pain    Preferred Imaging Location?:   [facility]   No orders of the defined types were placed in this encounter.    Discussed warning signs or symptoms. Please see discharge instructions. Patient expresses understanding.   The above documentation has been reviewed and is accurate and complete Artist Lloyd, M.D.

## 2024-08-11 NOTE — Discharge Instructions (Signed)
 Post Procedure Spinal Discharge Instruction Sheet  You may resume a regular diet and any medications that you routinely take (including pain medications) unless otherwise noted by MD.    No driving day of procedure.  Light activity throughout the rest of the day.  Do not do any strenuous work, exercise, bending or lifting.  The day following the procedure, you can resume normal physical activity but you should refrain from exercising or physical therapy for at least three days thereafter.  You may apply ice to the injection site, 20 minutes on, 20 minutes off, as needed. Do not apply ice directly to skin.    Common Side Effects:  Headaches- take your usual medications as directed by your physician.  Increase your fluid intake.  Caffeinated beverages may be helpful.  Lie flat in bed until your headache resolves.  Restlessness or inability to sleep- you may have trouble sleeping for the next few days.  Ask your referring physician if you need any medication for sleep.  Facial flushing or redness- should subside within a few days.  Increased pain- a temporary increase in pain a day or two following your procedure is not unusual.  Take your pain medication as prescribed by your referring physician.  Leg cramps  Please contact our office at [phone] for the following symptoms: Fever greater than 100 degrees. Headaches unresolved with medication after 2-3 days. Increased swelling, pain, or redness at injection site.   Thank you for visiting [facility] today!   You Resume Your Aspirin  Today

## 2024-08-12 ENCOUNTER — Ambulatory Visit
Admission: RE | Admit: 2024-08-12 | Discharge: 2024-08-12 | Disposition: A | Discharge status: Home / Self Care | Source: Ambulatory Visit | Attending: Family Medicine | Admitting: Family Medicine

## 2024-08-12 DIAGNOSIS — M5416 Radiculopathy, lumbar region: Secondary | ICD-10-CM

## 2024-08-12 MED ORDER — METHYLPREDNISOLONE ACETATE 40 MG/ML INJ SUSP (RADIOLOG
80.0000 mg | Freq: Once | INTRAMUSCULAR | Status: AC
Start: 2024-08-12 — End: 2024-08-12
  Administered 2024-08-12: 80 mg via EPIDURAL

## 2024-08-12 MED ORDER — IOPAMIDOL (ISOVUE-M 200) INJECTION 41%
1.0000 mL | Freq: Once | INTRAMUSCULAR | Status: AC
  Administered 2024-08-12: 1 mL via EPIDURAL

## 2024-08-23 ENCOUNTER — Other Ambulatory Visit: Payer: Self-pay | Admitting: Nurse Practitioner

## 2024-08-30 ENCOUNTER — Ambulatory Visit: Admitting: Primary Care

## 2024-08-30 ENCOUNTER — Encounter: Payer: Self-pay | Admitting: Primary Care

## 2024-08-30 VITALS — BP 138/88 | HR 81 | Temp 97.5°F | Ht 62.0 in | Wt 233.8 lb

## 2024-08-30 DIAGNOSIS — G4700 Insomnia, unspecified: Secondary | ICD-10-CM | POA: Diagnosis not present

## 2024-08-30 DIAGNOSIS — F17210 Nicotine dependence, cigarettes, uncomplicated: Secondary | ICD-10-CM | POA: Diagnosis not present

## 2024-08-30 DIAGNOSIS — G4733 Obstructive sleep apnea (adult) (pediatric): Secondary | ICD-10-CM | POA: Diagnosis not present

## 2024-08-30 NOTE — Progress Notes (Signed)
 @Patient  ID: Hailey Barnes, female    DOB: 07-21-1961, 63 y.o.   MRN: [id number]  No chief complaint on file.   Referring provider: Manon Jester, DO  HPI: 63 year old female, current smoker. PMH significant for HTN, heart failure, COPD, OSA, restrictive lung disease, type 2 diabetes, NASH, hyperlipidemia, obesity, tobacco use disorder.   08/30/2024 Discussed the use of AI scribe software for clinical note transcription with the patient, who gave verbal consent to proceed.  History of Present Illness Hailey Barnes is a 63 year old female with severe sleep apnea who presents for a sleep consult.  Sleep study in 2018 showing severe OSA, AHI 43/hour. Severe oxygen desaturation with spo2 low 72%. Moderate periodic limb movements during sleep study. Optimal PAP pressure 16cm h20. She uses CPAP inconsistently due to discomfort, describing it as 'suffocating'. She last used the CPAP the previous night but often removes it during the night due to discomfort.  She experiences a very dry mouth upon waking, sometimes to the point of gagging, which she attributes to difficulty keeping her mouth closed while using the CPAP. She uses a full-face mask and has not tried a nasal mask. The humidification feature is not adjusted, and the water chamber is often empty by morning.  Her sleep routine involves going to bed around 10 PM, taking 1-2 hours to fall asleep, and waking up 3-4 times per night, often to use the bathroom. She sometimes takes melatonin, 20 mg, to aid sleep. She does not feel rested upon waking, typically between 5 and 7 AM.  She mentions having lost weight since her initial sleep stud      Allergies  Allergen Reactions   Bupropion  Other (See Comments)    Throat soreness and difficulty swallowing.    Norvasc  [Amlodipine ] Swelling    Significant leg swelling with 10mg . Able to tolerate 2.5mg .     Immunization History  Administered Date(s) Administered    Influenza,inj,Quad PF,6+ Mos 06/23/2014, 12/07/2015, 08/07/2016, 09/21/2017, 09/07/2018, 08/19/2021   Influenza-Unspecified 07/11/2020   Moderna Sars-Covid-2 Vaccination 03/23/2020, 04/20/2020   PNEUMOCOCCAL CONJUGATE-20 10/23/2021   Pneumococcal Polysaccharide-23 03/31/2014    Past Medical History:  Diagnosis Date   Anxiety    Arthritis    knees (02/21/2014), back   CHF (congestive heart failure) (HCC)    Chronic bronchitis (HCC)    get it q yr (02/21/2014)   Chronic diastolic CHF (congestive heart failure) (HCC)    a. Echo 10/23/2016: EF 75 %   Chronic lower back pain    Complication of anesthesia    I don't come out well; I chew on my tongue   COPD (chronic obstructive pulmonary disease) (HCC)    Depression    Diabetes mellitus without complication (HCC)    TYPE 2   Family history of adverse reaction to anesthesia    BROTHER PONV, had a blood clot several days later and heart stopped   Fatty liver    NONALCOLIC   Fibroid    Heart murmur    Hypertension    Infection of right eye    current dx on Saturday 7/15 - using drops   Leg swelling    bilateral   Migraine 1982-2009   MVP (mitral valve prolapse)    Neuromuscular disorder (HCC)    right leg nerve pain   Shortness of breath    WITH EXERTION   Sinus pause    a. noted on telemetry during admission from 10/2016, lasting up to 4.4 seconds. BB  discontinued.    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   D/Dx: carpal tunnel, ulnar neuropathy Less likely: cervical radiculopathy, vasculitis     Tobacco History: Social History   Tobacco Use  Smoking Status Every Day   Current  packs/day: 0.50   Average packs/day: 0.5 packs/day for 47.9 years (23.9 ttl pk-yrs)   Types: Cigarettes   Start date: 10/20/1976   Passive exposure: Current  Smokeless Tobacco Never  Tobacco Comments   Previous 1 ppd smoker x > 40 years.  Now smokes 10 cigarettes per day   Ready to quit: Not Answered Counseling given: Not Answered Tobacco comments: Previous 1 ppd smoker x > 40 years.  Now smokes 10 cigarettes per day   Outpatient Medications Prior to Visit  Medication Sig Dispense Refill   Accu-Chek Softclix Lancets lancets Use to check blood sugar once daily. E11.9 100 each 12   allopurinol  (ZYLOPRIM ) 300 MG tablet TAKE 1 TABLET BY MOUTH EVERY DAY 90 tablet 1   amLODipine  (NORVASC ) 2.5 MG tablet TAKE 1 TABLET BY MOUTH EVERYDAY AT BEDTIME 90 tablet 3   aspirin  EC 81 MG tablet Take 81 mg by mouth daily.     atorvastatin  (LIPITOR) 40 MG tablet TAKE 1 TABLET BY MOUTH EVERY DAY 90 tablet 3   Colchicine  0.6 MG CAPS Take 1 capsule (0.6 mg total) by mouth daily. Take 2 tablets for the first dose, followed by one tablet an hour later. 12 hours later, take one tablet daily until gout flare resolves. 90 capsule 1   cyclobenzaprine  (FLEXERIL ) 5 MG tablet Take 1 tablet (5 mg total) by mouth every 8 (eight) hours as needed. 30 tablet 0   fluticasone  (FLONASE ) 50 MCG/ACT nasal spray SPRAY 2 SPRAYS INTO EACH NOSTRIL EVERY DAY 48 mL 2   glucose blood (ACCU-CHEK AVIVA) test strip Use to check sugar up to 3 times daily 100 each 12   hydrALAZINE  (APRESOLINE ) 100 MG tablet Take 1 tablet (100 mg total) by mouth 3 (three) times daily. 270 tablet 3   ibuprofen  (ADVIL ) 800 MG tablet Take 1 tablet (800 mg total) by mouth every 8 (eight) hours as needed for moderate pain. 20 tablet 0   lidocaine -prilocaine  (EMLA ) cream APPLY 1 APPLICATION TOPICALLY AS NEEDED. FOR UNDER BREAST 30 g 2   losartan  (COZAAR ) 100 MG tablet TAKE 1 TABLET BY MOUTH EVERY DAY 30 tablet 0   Melaton-Thean-Cham-PassF-LBalm (MELATONIN +  L-THEANINE) CAPS Take 2 capsules by mouth at bedtime as needed (sleep).     meloxicam  (MOBIC ) 15 MG tablet Take 1 tablet (15 mg total) by mouth daily. 30 tablet 0   metoCLOPramide  (REGLAN ) 10 MG tablet Take 1 tablet (10 mg total) by mouth as needed for nausea.     ondansetron  (ZOFRAN -ODT) 8 MG disintegrating tablet Take 1 tablet by mouth every 8 (eight) hours as needed.     pindolol  (VISKEN ) 10 MG tablet TAKE 2 TABLETS BY MOUTH 2 TIMES DAILY. 360 tablet 3   Polyvinyl Alcohol -Povidone (CLEAR EYES ALL SEASONS OP) Place 1 drop into both eyes daily as needed (allergies/itchy eyes).  spironolactone  (ALDACTONE ) 25 MG tablet TAKE 1 TABLET BY MOUTH EVERYDAY AT BEDTIME 90 tablet 3   torsemide  (DEMADEX ) 20 MG tablet TAKE 1 TABLET BY MOUTH EVERY OTHER DAY 15 tablet 5   TRELEGY ELLIPTA  200-62.5-25 MCG/ACT AEPB USE AS DIRECTED 1 PUFF IN THE MOUTH OR THROAT DAILY. TAKE 1 PUFF BY MOUTH EVERY DAY 60 each 3   triamcinolone  (KENALOG ) 0.025 % ointment APPLY 1 APPLICATION TOPICALLY TWICE A DAY AS NEEDED 75 g 0   No facility-administered medications prior to visit.   Review of Systems  Review of Systems  Constitutional:  Positive for fatigue.  HENT: Negative.    Respiratory: Negative.    Cardiovascular: Negative.   Psychiatric/Behavioral:  Positive for sleep disturbance.    Physical Exam  LMP 04/06/2017  Physical Exam Constitutional:      Appearance: Normal appearance. She is well-developed.  HENT:     Head: Normocephalic and atraumatic.     Mouth/Throat:     Mouth: Mucous membranes are moist.     Pharynx: Oropharynx is clear.  Eyes:     Pupils: Pupils are equal, round, and reactive to light.  Cardiovascular:     Rate and Rhythm: Normal rate and regular rhythm.     Heart sounds: Normal heart sounds. No murmur heard. Pulmonary:     Effort: Pulmonary effort is normal. No respiratory distress.     Breath sounds: Normal breath sounds. No wheezing or rhonchi.  Musculoskeletal:        General:  Normal range of motion.     Cervical back: Normal range of motion and neck supple.  Skin:    General: Skin is warm and dry.     Findings: No erythema or rash.  Neurological:     General: No focal deficit present.     Mental Status: She is alert and oriented to person, place, and time. Mental status is at baseline.  Psychiatric:        Mood and Affect: Mood normal.        Behavior: Behavior normal.        Thought Content: Thought content normal.        Judgment: Judgment normal.     Lab Results:  CBC    Component Value Date/Time   WBC 12.0 (H) 10/23/2023 0446   RBC 5.09 10/23/2023 0446   HGB 15.0 10/23/2023 0530   HGB 15.9 09/16/2022 1411   HCT 44.0 10/23/2023 0530   HCT 46.2 09/16/2022 1411   PLT 195 10/23/2023 0446   PLT 232 09/16/2022 1411   MCV 87.0 10/23/2023 0446   MCV 85 09/16/2022 1411   MCH 29.3 10/23/2023 0446   MCHC 33.6 10/23/2023 0446   RDW 14.7 10/23/2023 0446   RDW 13.7 09/16/2022 1411   LYMPHSABS 2.4 11/07/2022 0340   LYMPHSABS 3.9 (H) 06/20/2021 0947   MONOABS 1.2 (H) 11/07/2022 0340   EOSABS 0.1 11/07/2022 0340   EOSABS 0.1 06/20/2021 0947   BASOSABS 0.1 11/07/2022 0340   BASOSABS 0.0 06/20/2021 0947    BMET    Component Value Date/Time   NA 139 05/06/2024 1004   K 4.6 05/06/2024 1004   CL 103 05/06/2024 1004   CO2 23 05/06/2024 1004   GLUCOSE 82 05/06/2024 1004   GLUCOSE 93 10/23/2023 0446   BUN 9 05/06/2024 1004   CREATININE 0.94 05/06/2024 1004   CREATININE 1.21 (H) 12/08/2016 1538   CALCIUM  9.6 05/06/2024 1004   GFRNONAA >60 10/23/2023 0446   GFRNONAA 51 (L) 12/08/2016 1538  GFRAA 58 (L) 07/31/2020 1457   GFRAA 59 (L) 12/08/2016 1538    BNP    Component Value Date/Time   BNP 13.9 12/02/2016 1349   BNP 4.4 11/22/2015 1629    ProBNP    Component Value Date/Time   PROBNP 56.1 06/07/2014 1733    Imaging: DG INJECT DIAG/THERA/INC NEEDLE/CATH/PLC EPI/LUMB/SAC W/IMG Result Date: 08/12/2024 CLINICAL DATA:  Lumbosacral  spondylosis without myelopathy. Good response to multiple prior epidural injections at L4-5. Recurrent bilateral low back and leg pain, worse on the left. Bilateral leg tingling. FLUOROSCOPY: Radiation Exposure Index (as provided by the fluoroscopic device): 5.40 mGy Kerma PROCEDURE: The procedure, risks, benefits, and alternatives were explained to the patient. Questions regarding the procedure were encouraged and answered. The patient understands and consents to the procedure. LUMBAR EPIDURAL INJECTION: An interlaminar approach was performed on the left at L4-5. The overlying skin was cleansed and anesthetized. A 3.5 inch 20 gauge epidural needle was advanced using loss-of-resistance technique. DIAGNOSTIC EPIDURAL INJECTION Injection of Isovue -M 200 shows a good epidural pattern with spread above and below the level of needle placement primarily on the left. No vascular opacification is seen. THERAPEUTIC EPIDURAL INJECTION: 80 mg of Depo-Medrol  mixed with 2 mL of 1% lidocaine  were instilled. The procedure was well-tolerated, and the patient was discharged thirty minutes following the injection in good condition. COMPLICATIONS: None immediate IMPRESSION: Technically successful interlaminar epidural injection on the left at L4-5. Electronically Signed   By: Dasie Hamburg M.D.   On: 08/12/2024 14:57     Assessment & Plan:    1. OSA (obstructive sleep apnea) (Primary) - Ambulatory Referral for DME  Assessment and Plan Assessment & Plan Obstructive sleep apnea  Severe obstructive sleep apnea diagnosed in 2018 with 43 apneic events per hour and oxygen desaturation to 72%. CPAP intolerance due to discomfort and dry mouth, likely exacerbated by an outdated machine and improper pressure settings. Weight loss since initial diagnosis may have altered pressure requirements. Current CPAP usage is inconsistent, averaging 2 hours and 45 minutes per night, with frequent removal due to discomfort and dry eyes. -  Ordered new CPAP machine  - Adjusted CPAP pressure settings to 8-16 cm H2O on new machine. - Recommended trial of nasal mask with chin strap to address dry mouth and improve comfort. - Advised wearing CPAP for 4-6 hours per night. - Suggested using an eye mask to alleviate dry eyes.  Insomnia Difficulty falling asleep, taking 1-2 hours to fall asleep. Intermittently using melatonin 20 mg, which exceeds recommended dosage. Reports waking up 3-4 times per night and not feeling rested upon waking. CPAP use is under utilized.  - Recommended reducing melatonin to 10 mg nightly. - Advised patient be more consistent with CPAP use nightly  - Will consider alternative sleep aids if melatonin is ineffective.   Almarie LELON Ferrari, NP 08/30/2024

## 2024-08-30 NOTE — Patient Instructions (Signed)
  VISIT SUMMARY: You came in today for a sleep consultation due to severe sleep apnea and related symptoms. We discussed your current CPAP usage, issues with discomfort, and symptoms like dry mouth and dry eyes. We also talked about your difficulty falling asleep and not feeling rested upon waking.  YOUR PLAN: -OBSTRUCTIVE SLEEP APNEA WITH CPAP INTOLERANCE AND RELATED SYMPTOMS (DRY MOUTH, DRY EYES): Obstructive sleep apnea is a condition where your airway becomes blocked during sleep, causing breathing pauses. Your current CPAP machine may be outdated, and the pressure settings might not be right for you anymore. We have ordered a new CPAP machine with updated pressure settings (8-16 cm H2O) and recommended trying a nasal mask with a chin strap to help with dry mouth. You should aim to wear the CPAP for 4-6 hours per night. Using an eye mask may help with your dry eyes.  -INSOMNIA: Insomnia is difficulty falling or staying asleep. You are currently taking 20 mg of melatonin, which is more than the recommended dose. We suggest reducing the melatonin to 10 mg nightly and using it consistently at bedtime. If this does not help, we can consider other sleep aids.  INSTRUCTIONS: Please follow up with us  after you have tried the new CPAP machine and the nasal mask with chin strap. If you continue to have issues with sleep or discomfort, let us  know so we can make further adjustments.

## 2024-08-31 NOTE — Progress Notes (Signed)
 Hailey Barnes                                          MRN: [id number]   08/31/2024   The [facility] Quality Team Specialist reviewed this patient medical record for the purposes of chart review for care gap closure. The following were reviewed: abstraction for care gap closure-glycemic status assessment.    [facility] Quality Team

## 2024-09-09 ENCOUNTER — Telehealth: Payer: Self-pay

## 2024-09-09 NOTE — Telephone Encounter (Signed)
 Received request for physician's oxygen order from [facility]. Pt recently seen by Almarie Ferrari, NP on 08/30/24 and was prescribed w/ new CPAP. I will fax this to her clinic at [phone] for fulfill this request    Blondie Bosch, DO PGY 1 Family Medicine Resident

## 2024-09-13 ENCOUNTER — Telehealth: Payer: Self-pay

## 2024-09-13 NOTE — Telephone Encounter (Signed)
 CMN received from [facility] regarding pt's cpap supplies. This is only requiring a provider signature. I will fax once signed.

## 2024-09-18 ENCOUNTER — Other Ambulatory Visit: Payer: Self-pay

## 2024-09-18 DIAGNOSIS — J449 Chronic obstructive pulmonary disease, unspecified: Secondary | ICD-10-CM

## 2024-09-21 ENCOUNTER — Telehealth: Payer: Self-pay

## 2024-09-21 NOTE — Telephone Encounter (Signed)
 Copied from CRM #[id number]. Topic: Clinical - Order For Equipment >> Sep 19, 2024  3:50 PM Rozanna G wrote: Reason for CRM: pt calling to check the status of her equipment advised her that the clinic is still working on it, but she stated no one has contacted her about anything.  Spoke with patient looked into order CRM will be signed and sent back to [facility].   Advise to give [facility] ( [facility]) a call to see if they have received it

## 2024-10-07 NOTE — Telephone Encounter (Unsigned)
 Copied from CRM #[id number]. Topic: Clinical - Order For Equipment >> Oct 04, 2024  9:49 AM Devaughn RAMAN wrote: Reason for CRM: Pt called regarding [facility]. Pt stated she spoke with [facility] on today and they advised her they're waiting on pt's sleep study results and additional information that she is unsure of as they are awaitng a fax from the office.

## 2024-10-10 ENCOUNTER — Encounter: Payer: Self-pay | Admitting: Family Medicine

## 2024-10-25 ENCOUNTER — Ambulatory Visit: Admitting: Primary Care

## 2024-10-25 ENCOUNTER — Telehealth: Payer: Self-pay | Admitting: Primary Care

## 2024-10-25 ENCOUNTER — Encounter: Payer: Self-pay | Admitting: Primary Care

## 2024-10-25 VITALS — BP 132/74 | HR 75 | Temp 97.3°F | Ht 62.0 in | Wt 234.0 lb

## 2024-10-25 DIAGNOSIS — G4733 Obstructive sleep apnea (adult) (pediatric): Secondary | ICD-10-CM | POA: Diagnosis not present

## 2024-10-25 DIAGNOSIS — F17210 Nicotine dependence, cigarettes, uncomplicated: Secondary | ICD-10-CM | POA: Diagnosis not present

## 2024-10-25 DIAGNOSIS — J449 Chronic obstructive pulmonary disease, unspecified: Secondary | ICD-10-CM

## 2024-10-25 DIAGNOSIS — J984 Other disorders of lung: Secondary | ICD-10-CM

## 2024-10-25 DIAGNOSIS — G4700 Insomnia, unspecified: Secondary | ICD-10-CM

## 2024-10-25 NOTE — Telephone Encounter (Signed)
 I ordered new CPAP last month but patient has not received, can we follow up on this. DME is [facility]. They need a copy of her sleep study.

## 2024-10-25 NOTE — Patient Instructions (Signed)
" °  VISIT SUMMARY: During your follow-up visit, we discussed your ongoing issues with sleep apnea, COPD, insomnia, and tobacco use. We made some adjustments to your treatment plan to help manage these conditions more effectively.  YOUR PLAN: -OBSTRUCTIVE SLEEP APNEA: Obstructive sleep apnea is a condition where your breathing stops and starts during sleep due to blocked airways. We updated your CPAP settings to 8 to 16 and will follow up on getting you a new CPAP machine and the necessary sleep study information.  -CHRONIC OBSTRUCTIVE PULMONARY DISEASE (COPD): COPD is a chronic inflammatory lung disease that obstructs airflow from the lungs. To help with your increased mucus production, we recommend taking Mucinex  twice a day.  -INSOMNIA: Insomnia is a sleep disorder where you have trouble falling or staying asleep. You should continue taking trazodone  50 mg, and you have the option to increase the dose to 100 mg if needed. Take it 30 to 60 minutes before bedtime.  -TOBACCO USE: Smoking can worsen your COPD and overall health. We advise you to reduce smoking by one cigarette per day with the goal of quitting. You may also use nicotine  patches to help you stop smoking.  INSTRUCTIONS: We will follow up on getting you a new CPAP machine and the necessary sleep study information. Please continue with the updated treatment plan and let us  know if you have any issues or concerns.  "

## 2024-10-25 NOTE — Progress Notes (Signed)
 "  @Patient  ID: Hailey Barnes, female    DOB: 05/23/1961, 64 y.o.   MRN: [id number]  No chief complaint on file.   Referring provider: Manon Jester, DO  HPI: 64 year old female, current smoker. PMH significant for HTN, heart failure, COPD, OSA, restrictive lung disease, type 2 diabetes, NASH, hyperlipidemia, obesity, tobacco use disorder.   08/30/2024 Discussed the use of AI scribe software for clinical note transcription with the patient, who gave verbal consent to proceed.  History of Present Illness Hailey Barnes is a 64 year old female with severe sleep apnea who presents for a sleep consult.  Sleep study in 2018 showing severe OSA, AHI 43/hour. Severe oxygen desaturation with spo2 low 72%. Moderate periodic limb movements during sleep study. Optimal PAP pressure 16cm h20. She uses CPAP inconsistently due to discomfort, describing it as 'suffocating'. She last used the CPAP the previous night but often removes it during the night due to discomfort.  She experiences a very dry mouth upon waking, sometimes to the point of gagging, which she attributes to difficulty keeping her mouth closed while using the CPAP. She uses a full-face mask and has not tried a nasal mask. The humidification feature is not adjusted, and the water chamber is often empty by morning.  Her sleep routine involves going to bed around 10 PM, taking 1-2 hours to fall asleep, and waking up 3-4 times per night, often to use the bathroom. She sometimes takes melatonin, 20 mg, to aid sleep. She does not feel rested upon waking, typically between 5 and 7 AM.  She mentions having lost weight since her initial sleep stud      1. OSA (obstructive sleep apnea) (Primary) - Ambulatory Referral for DME  Assessment and Plan Assessment & Plan Obstructive sleep apnea  Severe obstructive sleep apnea diagnosed in 2018 with 43 apneic events per hour and oxygen desaturation to 72%. CPAP intolerance due to  discomfort and dry mouth, likely exacerbated by an outdated machine and improper pressure settings. Weight loss since initial diagnosis may have altered pressure requirements. Current CPAP usage is inconsistent, averaging 2 hours and 45 minutes per night, with frequent removal due to discomfort and dry eyes. - Ordered new CPAP machine  - Adjusted CPAP pressure settings to 8-16 cm H2O on new machine. - Recommended trial of nasal mask with chin strap to address dry mouth and improve comfort. - Advised wearing CPAP for 4-6 hours per night. - Suggested using an eye mask to alleviate dry eyes.  Insomnia Difficulty falling asleep, taking 1-2 hours to fall asleep. Intermittently using melatonin 20 mg, which exceeds recommended dosage. Reports waking up 3-4 times per night and not feeling rested upon waking. CPAP use is under utilized.  - Recommended reducing melatonin to 10 mg nightly. - Advised patient be more consistent with CPAP use nightly  - Will consider alternative sleep aids if melatonin is ineffective.     10/25/2024- Interim hx  Discussed the use of AI scribe software for clinical note transcription with the patient, who gave verbal consent to proceed.  History of Present Illness Hailey Barnes is a 64 year old female with sleep apnea and COPD who presents for a follow-up visit.  She has not received a new CPAP machine since the last visit, despite attempts to contact the supplier, [employer]. She experiences difficulty breathing since the settings were adjusted, although she believes the settings have not changed from sixteen to twenty.  Regarding insomnia, she has reduced her  melatonin use but finds it ineffective. Her primary care doctor prescribed trazodone  50 mg, which she started taking to help her stay asleep. She experiences increased mucus production at night, which is thick but not discolored. She has not used Mucinex  or similar medications.  She continues to smoke approximately  half a pack per day and has previously quit smoking without aids. She mentions receiving medication for hormonal issues and plans to see a psychiatrist for additional support.      Allergies[1]  Immunization History  Administered Date(s) Administered   Influenza,inj,Quad PF,6+ Mos 06/23/2014, 12/07/2015, 08/07/2016, 09/21/2017, 09/07/2018, 08/19/2021   Influenza-Unspecified 07/11/2020   Moderna Sars-Covid-2 Vaccination 03/23/2020, 04/20/2020   PNEUMOCOCCAL CONJUGATE-20 10/23/2021   Pneumococcal Polysaccharide-23 03/31/2014    Past Medical History:  Diagnosis Date   Anxiety    Arthritis    knees (02/21/2014), back   CHF (congestive heart failure) (HCC)    Chronic bronchitis (HCC)    get it q yr (02/21/2014)   Chronic diastolic CHF (congestive heart failure) (HCC)    a. Echo 10/23/2016: EF 75 %   Chronic lower back pain    Complication of anesthesia    I don't come out well; I chew on my tongue   COPD (chronic obstructive pulmonary disease) (HCC)    Depression    Diabetes mellitus without complication (HCC)    TYPE 2   Family history of adverse reaction to anesthesia    BROTHER PONV, had a blood clot several days later and heart stopped   Fatty liver    NONALCOLIC   Fibroid    Heart murmur    Hypertension    Infection of right eye    current dx on Saturday 7/15 - using drops   Leg swelling    bilateral   Migraine 1982-2009   MVP (mitral valve prolapse)    Neuromuscular disorder (HCC)    right leg nerve pain   Shortness of breath    WITH EXERTION   Sinus pause    a. noted on telemetry during admission from 10/2016, lasting up to 4.4 seconds. BB discontinued.    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 relapse 2 years clean   Tingling in  extremities 12/12/2010   Uric acid and electrolytes (02/23) normal but WBC elevated.   D/Dx: carpal tunnel, ulnar neuropathy Less likely: cervical radiculopathy, vasculitis     Tobacco History: Tobacco Use History[2] Ready to quit: Not Answered Counseling given: Not Answered Tobacco comments: Previous 1 ppd smoker x > 40 years.  Now smokes 10 cigarettes per day   Outpatient Medications Prior to Visit  Medication Sig Dispense Refill   Accu-Chek Softclix Lancets lancets Use to check blood sugar once daily. E11.9 100 each 12   allopurinol  (ZYLOPRIM ) 300 MG tablet TAKE 1 TABLET BY MOUTH EVERY DAY 90 tablet 1   amLODipine  (NORVASC ) 2.5 MG tablet TAKE 1 TABLET BY MOUTH EVERYDAY AT BEDTIME 90 tablet 3   aspirin  EC 81 MG tablet Take 81 mg by mouth daily.     atorvastatin  (LIPITOR) 40 MG tablet TAKE 1 TABLET BY MOUTH EVERY DAY 90 tablet 3   Colchicine  0.6 MG CAPS Take 1 capsule (0.6 mg total) by mouth daily. Take 2 tablets  for the first dose, followed by one tablet an hour later. 12 hours later, take one tablet daily until gout flare resolves. 90 capsule 1   cyclobenzaprine  (FLEXERIL ) 5 MG tablet Take 1 tablet (5 mg total) by mouth every 8 (eight) hours as needed. 30 tablet 0   fluticasone  (FLONASE ) 50 MCG/ACT nasal spray SPRAY 2 SPRAYS INTO EACH NOSTRIL EVERY DAY 48 mL 2   glucose blood (ACCU-CHEK AVIVA) test strip Use to check sugar up to 3 times daily 100 each 12   hydrALAZINE  (APRESOLINE ) 100 MG tablet Take 1 tablet (100 mg total) by mouth 3 (three) times daily. 270 tablet 3   ibuprofen  (ADVIL ) 800 MG tablet Take 1 tablet (800 mg total) by mouth every 8 (eight) hours as needed for moderate pain. 20 tablet 0   lidocaine -prilocaine  (EMLA ) cream APPLY 1 APPLICATION TOPICALLY AS NEEDED. FOR UNDER BREAST 30 g 2   losartan  (COZAAR ) 100 MG tablet TAKE 1 TABLET BY MOUTH EVERY DAY 30 tablet 0   Melaton-Thean-Cham-PassF-LBalm (MELATONIN + L-THEANINE) CAPS Take 2 capsules by mouth at bedtime as needed  (sleep).     meloxicam  (MOBIC ) 15 MG tablet Take 1 tablet (15 mg total) by mouth daily. 30 tablet 0   metoCLOPramide  (REGLAN ) 10 MG tablet Take 1 tablet (10 mg total) by mouth as needed for nausea.     ondansetron  (ZOFRAN -ODT) 8 MG disintegrating tablet Take 1 tablet by mouth every 8 (eight) hours as needed.     pindolol  (VISKEN ) 10 MG tablet TAKE 2 TABLETS BY MOUTH 2 TIMES DAILY. 360 tablet 3   Polyvinyl Alcohol -Povidone (CLEAR EYES ALL SEASONS OP) Place 1 drop into both eyes daily as needed (allergies/itchy eyes).     spironolactone  (ALDACTONE ) 25 MG tablet TAKE 1 TABLET BY MOUTH EVERYDAY AT BEDTIME 90 tablet 3   torsemide  (DEMADEX ) 20 MG tablet TAKE 1 TABLET BY MOUTH EVERY OTHER DAY 15 tablet 5   TRELEGY ELLIPTA  200-62.5-25 MCG/ACT AEPB USE AS DIRECTED 1 PUFF IN THE MOUTH OR THROAT DAILY. TAKE 1 PUFF BY MOUTH EVERY DAY 60 each 3   triamcinolone  (KENALOG ) 0.025 % ointment APPLY 1 APPLICATION TOPICALLY TWICE A DAY AS NEEDED 75 g 0   No facility-administered medications prior to visit.    Review of Systems  Review of Systems  Constitutional: Negative.   HENT:  Positive for congestion.   Respiratory:  Positive for cough.    Physical Exam  LMP 04/06/2017  Physical Exam Constitutional:      Appearance: Normal appearance. She is well-developed.  HENT:     Head: Normocephalic and atraumatic.     Mouth/Throat:     Mouth: Mucous membranes are moist.     Pharynx: Oropharynx is clear.  Eyes:     Pupils: Pupils are equal, round, and reactive to light.  Cardiovascular:     Rate and Rhythm: Normal rate and regular rhythm.     Heart sounds: Normal heart sounds. No murmur heard. Pulmonary:     Effort: Pulmonary effort is normal. No respiratory distress.     Breath sounds: Normal breath sounds. No wheezing or rhonchi.  Musculoskeletal:        General: Normal range of motion.     Cervical back: Normal range of motion and neck supple.  Skin:    General: Skin is warm and dry.     Findings:  No erythema or rash.  Neurological:     General: No focal deficit present.     Mental Status: She is alert  and oriented to person, place, and time. Mental status is at baseline.  Psychiatric:        Mood and Affect: Mood normal.        Behavior: Behavior normal.        Thought Content: Thought content normal.        Judgment: Judgment normal.       Lab Results:  CBC    Component Value Date/Time   WBC 12.0 (H) 10/23/2023 0446   RBC 5.09 10/23/2023 0446   HGB 15.0 10/23/2023 0530   HGB 15.9 09/16/2022 1411   HCT 44.0 10/23/2023 0530   HCT 46.2 09/16/2022 1411   PLT 195 10/23/2023 0446   PLT 232 09/16/2022 1411   MCV 87.0 10/23/2023 0446   MCV 85 09/16/2022 1411   MCH 29.3 10/23/2023 0446   MCHC 33.6 10/23/2023 0446   RDW 14.7 10/23/2023 0446   RDW 13.7 09/16/2022 1411   LYMPHSABS 2.4 11/07/2022 0340   LYMPHSABS 3.9 (H) 06/20/2021 0947   MONOABS 1.2 (H) 11/07/2022 0340   EOSABS 0.1 11/07/2022 0340   EOSABS 0.1 06/20/2021 0947   BASOSABS 0.1 11/07/2022 0340   BASOSABS 0.0 06/20/2021 0947    BMET    Component Value Date/Time   NA 139 05/06/2024 1004   K 4.6 05/06/2024 1004   CL 103 05/06/2024 1004   CO2 23 05/06/2024 1004   GLUCOSE 82 05/06/2024 1004   GLUCOSE 93 10/23/2023 0446   BUN 9 05/06/2024 1004   CREATININE 0.94 05/06/2024 1004   CREATININE 1.21 (H) 12/08/2016 1538   CALCIUM  9.6 05/06/2024 1004   GFRNONAA >60 10/23/2023 0446   GFRNONAA 51 (L) 12/08/2016 1538   GFRAA 58 (L) 07/31/2020 1457   GFRAA 59 (L) 12/08/2016 1538    BNP    Component Value Date/Time   BNP 13.9 12/02/2016 1349   BNP 4.4 11/22/2015 1629    ProBNP    Component Value Date/Time   PROBNP 56.1 06/07/2014 1733    Imaging: No results found.   Assessment & Plan:   1. Restrictive lung disease (Primary)  2. OSA (obstructive sleep apnea)  3. Chronic obstructive pulmonary disease, unspecified COPD type (HCC)   Assessment and Plan Assessment & Plan Obstructive sleep  apnea Difficulty breathing since CPAP settings were not adjusted as requested. Current settings remain at 16 to 20, but she requested a change. Awaiting new CPAP machine from [employer] due to missing sleep study information. - Updated CPAP settings to 8 to 16. - Sent message to secretary to follow up on new CPAP machine and sleep study information.  Chronic obstructive pulmonary disease Increased mucus production at night, Mucus is thick and white. Currently using Trelegy 200. - Recommended Mucinex  600mg  twice a day.  Insomnia Managed with trazodone  50 mg. She reports not getting good sleep with melatonin. Trazodone  dose can be increased if needed. - Advised taking trazodone  50 mg, with the option to increase to 100 mg if not effective. - Instructed to take trazodone  30 to 60 minutes before bedtime.  Tobacco use Currently smoking half a pack per day. Previous successful cessation using nicotine  patches and gum. - Advised to reduce smoking by one cigarette per week, aiming to quit. - Suggested using nicotine  patches to aid in smoking cessation.   Almarie LELON Ferrari, NP 10/25/2024     [1]  Allergies Allergen Reactions   Bupropion  Other (See Comments)    Throat soreness and difficulty swallowing.    Norvasc  [Amlodipine ] Swelling  Significant leg swelling with 10mg . Able to tolerate 2.5mg .   [2]  Social History Tobacco Use  Smoking Status Every Day   Current packs/day: 0.50   Average packs/day: 0.5 packs/day for 48.0 years (24.0 ttl pk-yrs)   Types: Cigarettes   Start date: 10/20/1976   Passive exposure: Current  Smokeless Tobacco Never  Tobacco Comments   Previous 1 ppd smoker x > 40 years.  Now smokes 10 cigarettes per day   "

## 2024-10-27 NOTE — Telephone Encounter (Signed)
 Patient had a sleep study back in 2018 and its in the chart I will send message to [employer] to let them know that sleep study is in the chart and asked if they had all they need.

## 2024-11-02 NOTE — Telephone Encounter (Signed)
 I have received a message back from [facility] they have all they need

## 2024-11-02 NOTE — Telephone Encounter (Signed)
 I just received this message on this order New, Bradley  New, Bradley; Bowne, Chantel C; Tucker, Dolanda; [place], [place]; Ziegler, Melissa Hello,  We need an updated order to review for processing. We need the date updated so we can use the last OV note as the F83f.  Please let me know when the order is updated.  Thank you,  Arvella Leer

## 2024-11-04 NOTE — Telephone Encounter (Signed)
 New order placed

## 2024-11-08 ENCOUNTER — Ambulatory Visit

## 2024-11-08 ENCOUNTER — Other Ambulatory Visit: Payer: Self-pay

## 2024-11-08 ENCOUNTER — Ambulatory Visit: Admitting: Family Medicine

## 2024-11-08 VITALS — BP 184/92 | HR 75 | Ht 62.0 in | Wt 231.0 lb

## 2024-11-08 DIAGNOSIS — M25562 Pain in left knee: Secondary | ICD-10-CM

## 2024-11-08 DIAGNOSIS — M5416 Radiculopathy, lumbar region: Secondary | ICD-10-CM

## 2024-11-08 DIAGNOSIS — M170 Bilateral primary osteoarthritis of knee: Secondary | ICD-10-CM | POA: Diagnosis not present

## 2024-11-08 DIAGNOSIS — R29898 Other symptoms and signs involving the musculoskeletal system: Secondary | ICD-10-CM

## 2024-11-08 DIAGNOSIS — G8929 Other chronic pain: Secondary | ICD-10-CM

## 2024-11-08 DIAGNOSIS — M25561 Pain in right knee: Secondary | ICD-10-CM

## 2024-11-08 NOTE — Patient Instructions (Addendum)
 Thank you for coming in today.   Please get an Xray today before you leave   You received an injection today. Seek immediate medical attention if the joint becomes red, extremely painful, or is oozing fluid.   You should hear from MRI scheduling within 1 week. If you do not hear please let me know.    Let's see what the MRI shows and plan from there

## 2024-11-08 NOTE — Progress Notes (Signed)
 "        I, Ileana Collet, PhD, LAT, ATC acting as a scribe for Artist Lloyd, MD.  Hailey Barnes is a 64 y.o. female who presents to [facility] today for bilateral knee pain. Pt was last seen for her knees in 2023. Last bilat knee steroid injection 03/28/22.  Today, pt reports she has been wearing knee braces, which she finds helpful. Bilat knee pain returned about 2-3 weeks ago. She notes suffering a fall, landing on her L knee.   Additionally she notes continued leg pain.  She has been evaluated for lumbar radiculopathy in the past.  She notes a right foot drop and some weakness and difficulty ambulating at times.  She did have an epidural steroid injection in October 2025 which did not help much.  Previous lumbar spine MRI is old now from November 2023.  Knee swelling: yes-L-side Mechanical symptoms: yes Treatments tried: creams, Tylenol   Dx testing: 10/23/21 R & L knee XR  Pertinent review of systems: No fevers or chills  Relevant historical information: Heart failure.  COPD.  Diabetes.   Exam:  BP (!) 184/92   Pulse 75   Ht 5' 2 (1.575 m)   Wt 231 lb (104.8 kg)   LMP 04/06/2017   SpO2 96%   BMI 42.25 kg/m  General: Well Developed, well nourished, and in no acute distress.   MSK: Knees bilaterally mild effusion.  Normal motion.  Mildly tender palpation medial joint line both knees.  L-spine decreased lumbar motion.  Lower extremity strength is decreased right foot dorsiflexion rated 4/5.    Lab and Radiology Results  Procedure: Real-time Ultrasound Guided Injection of right knee joint superior lateral patella space Device: Philips Affiniti 50G/GE Logiq Images permanently stored and available for review in PACS Verbal informed consent obtained.  Discussed risks and benefits of procedure. Warned about infection, bleeding, hyperglycemia damage to structures among others. Patient expresses understanding and agreement Time-out conducted.    Noted no overlying erythema, induration, or other signs of local infection.   Skin prepped in a sterile fashion.   Local anesthesia: Topical Ethyl chloride.   With sterile technique and under real time ultrasound guidance: 40 mg of Kenalog  and 2 mL of Marcaine  injected into knee joint. Fluid seen entering the joint capsule.   Completed without difficulty   Pain immediately resolved suggesting accurate placement of the medication.   Advised to call if fevers/chills, erythema, induration, drainage, or persistent bleeding.   Images permanently stored and available for review in the ultrasound unit.  Impression: Technically successful ultrasound guided injection.   Procedure: Real-time Ultrasound Guided Injection of left knee joint superior lateral patella space Device: Philips Affiniti 50G/GE Logiq Images permanently stored and available for review in PACS Verbal informed consent obtained.  Discussed risks and benefits of procedure. Warned about infection, bleeding, hyperglycemia damage to structures among others. Patient expresses understanding and agreement Time-out conducted.   Noted no overlying erythema, induration, or other signs of local infection.   Skin prepped in a sterile fashion.   Local anesthesia: Topical Ethyl chloride.   With sterile technique and under real time ultrasound guidance: 40 mg of Kenalog  and 2 mL of Marcaine  injected into knee joint. Fluid seen entering the joint capsule.   Completed without difficulty   Pain immediately resolved suggesting accurate placement of the medication.   Advised to call if fevers/chills, erythema, induration, drainage, or persistent bleeding.   Images permanently stored and available for review  in the ultrasound unit.  Impression: Technically successful ultrasound guided injection.   X-ray images lumbar spine and bilateral knees obtained today personally and independently interpreted.  Lumbar spine: Anterolisthesis L4-5 and DDD L4-5  and L5-S1.  No acute fractures are visible.  Right knee: Severe medial DJD.  Moderate patellofemoral DJD.  No acute fractures are visible.  Left knee: Severe medial DJD.  Moderate patellofemoral DJD.  No acute fractures are visible.  Await formal radiology review    Assessment and Plan: 64 y.o. female with bilateral knee pain due to exacerbation of DJD.  Plan for steroid injections both knees today.  Additionally patient is experiencing worsening lumbar radicular symptoms including leg weakness.  Plan for updated lumbar spine MRI and anticipate followed by epidural steroid injection.  Consider recheck after MRI based on findings.   PDMP not reviewed this encounter. Orders Placed This Encounter  Procedures   US  LIMITED JOINT SPACE STRUCTURES LOW BILAT(NO LINKED CHARGES)    Reason for Exam (SYMPTOM  OR DIAGNOSIS REQUIRED):   bilateral knee pain    Preferred imaging location?:   [facility] Knee AP/LAT W/Sunrise Left    Standing Status:   Future    Number of Occurrences:   1    Expiration Date:   12/09/2024    Reason for Exam (SYMPTOM  OR DIAGNOSIS REQUIRED):   bilateral knee pain    Preferred imaging location?:   [facility]   DG Knee AP/LAT W/Sunrise Right    Standing Status:   Future    Number of Occurrences:   1    Expiration Date:   12/09/2024    Reason for Exam (SYMPTOM  OR DIAGNOSIS REQUIRED):   bilateral knee pain    Preferred imaging location?:   [facility]   DG Lumbar Spine 2-3 Views    Standing Status:   Future    Number of Occurrences:   1    Expiration Date:   12/09/2024    Reason for Exam (SYMPTOM  OR DIAGNOSIS REQUIRED):   leg weakness    Preferred imaging location?:   [facility]   MR Lumbar Spine Wo Contrast    Standing Status:   Future    Expiration Date:   11/08/2025    What is the patient's sedation requirement?:   No Sedation    Does the patient have a pacemaker or implanted devices?:   No     Preferred imaging location?:   [facility] (table limit-550lbs)   No orders of the defined types were placed in this encounter.    Discussed warning signs or symptoms. Please see discharge instructions. Patient expresses understanding.   The above documentation has been reviewed and is accurate and complete Artist Lloyd, M.D.   "

## 2024-11-14 ENCOUNTER — Ambulatory Visit: Admitting: Physician Assistant

## 2024-11-15 ENCOUNTER — Ambulatory Visit: Payer: Self-pay | Admitting: Family Medicine

## 2024-11-15 NOTE — Progress Notes (Signed)
 Low back x-ray shows multilevel arthritis severe at L4-5.

## 2024-11-15 NOTE — Progress Notes (Signed)
 Left knee x-ray shows severe arthritis.

## 2024-11-15 NOTE — Progress Notes (Signed)
Right knee x-ray shows severe arthritis

## 2024-11-20 ENCOUNTER — Other Ambulatory Visit

## 2024-11-29 ENCOUNTER — Other Ambulatory Visit (HOSPITAL_BASED_OUTPATIENT_CLINIC_OR_DEPARTMENT_OTHER)

## 2024-12-02 ENCOUNTER — Ambulatory Visit: Admitting: Physician Assistant

## 2024-12-04 ENCOUNTER — Other Ambulatory Visit

## 2025-02-22 ENCOUNTER — Ambulatory Visit: Admitting: Primary Care

## 2025-04-27 ENCOUNTER — Encounter
